# Patient Record
Sex: Male | Born: 1935
Health system: Southern US, Community
[De-identification: ages and names within clinical notes are randomized; demographics above are authoritative.]

## PROBLEM LIST (undated history)

## (undated) DIAGNOSIS — K5790 Diverticulosis of intestine, part unspecified, without perforation or abscess without bleeding: Secondary | ICD-10-CM

## (undated) DIAGNOSIS — E7800 Pure hypercholesterolemia, unspecified: Secondary | ICD-10-CM

## (undated) DIAGNOSIS — I10 Essential (primary) hypertension: Secondary | ICD-10-CM

## (undated) DIAGNOSIS — I719 Aortic aneurysm of unspecified site, without rupture: Secondary | ICD-10-CM

## (undated) DIAGNOSIS — M1990 Unspecified osteoarthritis, unspecified site: Secondary | ICD-10-CM

## (undated) DIAGNOSIS — I2510 Atherosclerotic heart disease of native coronary artery without angina pectoris: Secondary | ICD-10-CM

## (undated) DIAGNOSIS — D472 Monoclonal gammopathy: Secondary | ICD-10-CM

## (undated) DIAGNOSIS — Z8719 Personal history of other diseases of the digestive system: Secondary | ICD-10-CM

## (undated) DIAGNOSIS — D649 Anemia, unspecified: Secondary | ICD-10-CM

## (undated) DIAGNOSIS — K219 Gastro-esophageal reflux disease without esophagitis: Secondary | ICD-10-CM

## (undated) DIAGNOSIS — I509 Heart failure, unspecified: Secondary | ICD-10-CM

## (undated) DIAGNOSIS — N189 Chronic kidney disease, unspecified: Secondary | ICD-10-CM

## (undated) HISTORY — PX: HERNIA REPAIR: SHX51

## (undated) HISTORY — PX: CORONARY ANGIOPLASTY: SHX604

## (undated) HISTORY — PX: CARDIAC VALVE REPLACEMENT: SHX585

## (undated) HISTORY — PX: CORONARY ARTERY BYPASS GRAFT: SHX141

## (undated) HISTORY — PX: OTHER SURGICAL HISTORY: SHX169

## (undated) HISTORY — PX: COLON SURGERY: SHX602

## (undated) HISTORY — PX: JOINT REPLACEMENT: SHX530

## (undated) HISTORY — DX: Chronic kidney disease, unspecified: N18.9

## (undated) HISTORY — DX: Heart failure, unspecified: I50.9

## (undated) HISTORY — DX: Monoclonal gammopathy: D47.2

## (undated) HISTORY — PX: EYE SURGERY: SHX253

## (undated) HISTORY — PX: CHOLECYSTECTOMY: SHX55

## (undated) HISTORY — PX: AORTIC VALVE REPLACEMENT: SHX41

---

## 2004-08-03 ENCOUNTER — Ambulatory Visit: Payer: Self-pay | Admitting: Urology

## 2005-07-01 ENCOUNTER — Emergency Department: Payer: Self-pay | Admitting: Internal Medicine

## 2006-02-24 ENCOUNTER — Emergency Department: Payer: Self-pay | Admitting: Emergency Medicine

## 2008-12-24 ENCOUNTER — Inpatient Hospital Stay: Payer: Self-pay | Admitting: Surgery

## 2009-01-31 ENCOUNTER — Ambulatory Visit: Payer: Self-pay | Admitting: Gastroenterology

## 2009-08-15 ENCOUNTER — Ambulatory Visit: Payer: Self-pay | Admitting: Internal Medicine

## 2009-09-08 ENCOUNTER — Observation Stay: Payer: Self-pay | Admitting: Internal Medicine

## 2009-11-10 ENCOUNTER — Ambulatory Visit: Payer: Self-pay

## 2009-12-09 ENCOUNTER — Ambulatory Visit: Payer: Self-pay

## 2010-01-16 ENCOUNTER — Ambulatory Visit: Payer: Self-pay | Admitting: Internal Medicine

## 2010-02-14 ENCOUNTER — Ambulatory Visit: Payer: Self-pay | Admitting: Surgery

## 2010-02-16 ENCOUNTER — Encounter: Payer: Self-pay | Admitting: Surgery

## 2010-02-16 ENCOUNTER — Ambulatory Visit (HOSPITAL_COMMUNITY): Admission: RE | Admit: 2010-02-16 | Discharge: 2010-02-16 | Payer: Self-pay | Admitting: Surgery

## 2010-02-23 ENCOUNTER — Encounter: Payer: Self-pay | Admitting: Surgery

## 2010-02-23 ENCOUNTER — Inpatient Hospital Stay (HOSPITAL_COMMUNITY): Admission: RE | Admit: 2010-02-23 | Discharge: 2010-02-28 | Payer: Self-pay | Admitting: Surgery

## 2010-02-23 ENCOUNTER — Ambulatory Visit: Payer: Self-pay | Admitting: Surgery

## 2010-03-22 ENCOUNTER — Ambulatory Visit: Payer: Self-pay

## 2010-03-28 ENCOUNTER — Ambulatory Visit: Payer: Self-pay | Admitting: Surgery

## 2010-03-28 ENCOUNTER — Encounter: Admission: RE | Admit: 2010-03-28 | Discharge: 2010-03-28 | Payer: Self-pay | Admitting: Surgery

## 2010-04-04 ENCOUNTER — Encounter: Payer: Self-pay | Admitting: Internal Medicine

## 2010-04-17 ENCOUNTER — Encounter: Payer: Self-pay | Admitting: Internal Medicine

## 2010-05-10 ENCOUNTER — Ambulatory Visit: Payer: Self-pay | Admitting: General Practice

## 2010-05-18 ENCOUNTER — Encounter: Payer: Self-pay | Admitting: Internal Medicine

## 2010-05-24 ENCOUNTER — Inpatient Hospital Stay: Payer: Self-pay | Admitting: General Practice

## 2010-08-30 ENCOUNTER — Ambulatory Visit: Payer: Self-pay | Admitting: Internal Medicine

## 2010-10-20 ENCOUNTER — Ambulatory Visit: Payer: Self-pay

## 2010-11-23 ENCOUNTER — Ambulatory Visit: Payer: Self-pay | Admitting: General Practice

## 2010-12-04 LAB — POCT I-STAT 3, ART BLOOD GAS (G3+)
Acid-base deficit: 1 mmol/L (ref 0.0–2.0)
Acid-base deficit: 2 mmol/L (ref 0.0–2.0)
Bicarbonate: 22.1 mEq/L (ref 20.0–24.0)
Bicarbonate: 24.9 mEq/L — ABNORMAL HIGH (ref 20.0–24.0)
O2 Saturation: 100 %
Patient temperature: 33.5
TCO2: 23 mmol/L (ref 0–100)
TCO2: 26 mmol/L (ref 0–100)
pH, Arterial: 7.39 (ref 7.350–7.450)
pO2, Arterial: 124 mmHg — ABNORMAL HIGH (ref 80.0–100.0)
pO2, Arterial: 308 mmHg — ABNORMAL HIGH (ref 80.0–100.0)

## 2010-12-04 LAB — POCT I-STAT 4, (NA,K, GLUC, HGB,HCT)
Glucose, Bld: 127 mg/dL — ABNORMAL HIGH (ref 70–99)
Glucose, Bld: 87 mg/dL (ref 70–99)
HCT: 25 % — ABNORMAL LOW (ref 39.0–52.0)
HCT: 31 % — ABNORMAL LOW (ref 39.0–52.0)
Hemoglobin: 10.5 g/dL — ABNORMAL LOW (ref 13.0–17.0)
Hemoglobin: 10.5 g/dL — ABNORMAL LOW (ref 13.0–17.0)
Hemoglobin: 10.5 g/dL — ABNORMAL LOW (ref 13.0–17.0)
Hemoglobin: 12.6 g/dL — ABNORMAL LOW (ref 13.0–17.0)
Hemoglobin: 8.5 g/dL — ABNORMAL LOW (ref 13.0–17.0)
Potassium: 3.6 mEq/L (ref 3.5–5.1)
Potassium: 3.8 mEq/L (ref 3.5–5.1)
Potassium: 4.1 mEq/L (ref 3.5–5.1)
Potassium: 4.1 mEq/L (ref 3.5–5.1)
Potassium: 4.4 mEq/L (ref 3.5–5.1)
Sodium: 129 mEq/L — ABNORMAL LOW (ref 135–145)
Sodium: 132 mEq/L — ABNORMAL LOW (ref 135–145)

## 2010-12-04 LAB — BASIC METABOLIC PANEL
BUN: 14 mg/dL (ref 6–23)
BUN: 19 mg/dL (ref 6–23)
BUN: 21 mg/dL (ref 6–23)
BUN: 26 mg/dL — ABNORMAL HIGH (ref 6–23)
BUN: 30 mg/dL — ABNORMAL HIGH (ref 6–23)
CO2: 21 mEq/L (ref 19–32)
CO2: 27 mEq/L (ref 19–32)
CO2: 27 mEq/L (ref 19–32)
Calcium: 8 mg/dL — ABNORMAL LOW (ref 8.4–10.5)
Calcium: 8.3 mg/dL — ABNORMAL LOW (ref 8.4–10.5)
Chloride: 101 mEq/L (ref 96–112)
Chloride: 95 mEq/L — ABNORMAL LOW (ref 96–112)
Chloride: 96 mEq/L (ref 96–112)
Chloride: 99 mEq/L (ref 96–112)
Creatinine, Ser: 1.32 mg/dL (ref 0.4–1.5)
Creatinine, Ser: 1.8 mg/dL — ABNORMAL HIGH (ref 0.4–1.5)
GFR calc Af Amer: 44 mL/min — ABNORMAL LOW (ref >60)
GFR calc Af Amer: >60 mL/min (ref >60)
GFR calc non Af Amer: 37 mL/min — ABNORMAL LOW (ref >60)
GFR calc non Af Amer: 46 mL/min — ABNORMAL LOW (ref >60)
Glucose, Bld: 100 mg/dL — ABNORMAL HIGH (ref 70–99)
Glucose, Bld: 114 mg/dL — ABNORMAL HIGH (ref 70–99)
Glucose, Bld: 98 mg/dL (ref 70–99)
Potassium: 4.1 mEq/L (ref 3.5–5.1)
Potassium: 4.5 mEq/L (ref 3.5–5.1)
Potassium: 5.2 mEq/L — ABNORMAL HIGH (ref 3.5–5.1)
Sodium: 128 mEq/L — ABNORMAL LOW (ref 135–145)
Sodium: 128 mEq/L — ABNORMAL LOW (ref 135–145)
Sodium: 130 mEq/L — ABNORMAL LOW (ref 135–145)

## 2010-12-04 LAB — CBC
HCT: 20.2 % — ABNORMAL LOW (ref 39.0–52.0)
HCT: 20.5 % — ABNORMAL LOW (ref 39.0–52.0)
HCT: 21.1 % — ABNORMAL LOW (ref 39.0–52.0)
HCT: 22.9 % — ABNORMAL LOW (ref 39.0–52.0)
HCT: 40 % (ref 39.0–52.0)
Hemoglobin: 13.7 g/dL (ref 13.0–17.0)
Hemoglobin: 7 g/dL — ABNORMAL LOW (ref 13.0–17.0)
Hemoglobin: 7.3 g/dL — ABNORMAL LOW (ref 13.0–17.0)
Hemoglobin: 7.8 g/dL — ABNORMAL LOW (ref 13.0–17.0)
Hemoglobin: 8.5 g/dL — ABNORMAL LOW (ref 13.0–17.0)
Hemoglobin: 8.8 g/dL — ABNORMAL LOW (ref 13.0–17.0)
MCHC: 34.1 g/dL (ref 30.0–36.0)
MCHC: 34.3 g/dL (ref 30.0–36.0)
MCHC: 34.3 g/dL (ref 30.0–36.0)
MCV: 94.5 fL (ref 78.0–100.0)
MCV: 94.8 fL (ref 78.0–100.0)
MCV: 97.3 fL (ref 78.0–100.0)
Platelets: 136 10*3/uL — ABNORMAL LOW (ref 150–400)
Platelets: 149 10*3/uL — ABNORMAL LOW (ref 150–400)
Platelets: 171 10*3/uL (ref 150–400)
Platelets: 91 10*3/uL — ABNORMAL LOW (ref 150–400)
RBC: 2.13 MIL/uL — ABNORMAL LOW (ref 4.22–5.81)
RBC: 2.24 MIL/uL — ABNORMAL LOW (ref 4.22–5.81)
RBC: 2.65 MIL/uL — ABNORMAL LOW (ref 4.22–5.81)
RDW: 13.6 % (ref 11.5–15.5)
RDW: 16.5 % — ABNORMAL HIGH (ref 11.5–15.5)
WBC: 15.5 10*3/uL — ABNORMAL HIGH (ref 4.0–10.5)
WBC: 9.3 10*3/uL (ref 4.0–10.5)
WBC: 9.9 10*3/uL (ref 4.0–10.5)

## 2010-12-04 LAB — PROTIME-INR
INR: 1.71 — ABNORMAL HIGH (ref 0.00–1.49)
Prothrombin Time: 13.4 seconds (ref 11.6–15.2)
Prothrombin Time: 19.9 seconds — ABNORMAL HIGH (ref 11.6–15.2)

## 2010-12-04 LAB — PREPARE PLATELETS

## 2010-12-04 LAB — DIFFERENTIAL
Eosinophils Absolute: 0.2 10*3/uL (ref 0.0–0.7)
Eosinophils Relative: 2 % (ref 0–5)
Lymphocytes Relative: 10 % — ABNORMAL LOW (ref 12–46)
Lymphs Abs: 0.9 10*3/uL (ref 0.7–4.0)
Monocytes Absolute: 1.1 10*3/uL — ABNORMAL HIGH (ref 0.1–1.0)
Monocytes Relative: 12 % (ref 3–12)

## 2010-12-04 LAB — COMPREHENSIVE METABOLIC PANEL
Alkaline Phosphatase: 73 U/L (ref 39–117)
Calcium: 9.2 mg/dL (ref 8.4–10.5)
Chloride: 94 mEq/L — ABNORMAL LOW (ref 96–112)
Creatinine, Ser: 1.13 mg/dL (ref 0.4–1.5)
Glucose, Bld: 104 mg/dL — ABNORMAL HIGH (ref 70–99)
Total Bilirubin: 1.1 mg/dL (ref 0.3–1.2)

## 2010-12-04 LAB — URINALYSIS, ROUTINE W REFLEX MICROSCOPIC
Hgb urine dipstick: NEGATIVE
Protein, ur: NEGATIVE mg/dL
Specific Gravity, Urine: 1.011 (ref 1.005–1.030)

## 2010-12-04 LAB — HEMOGLOBIN A1C: Mean Plasma Glucose: 108 mg/dL (ref <117)

## 2010-12-04 LAB — ABO/RH: ABO/RH(D): A POS

## 2010-12-04 LAB — BLOOD GAS, ARTERIAL
Acid-base deficit: 0.3 mmol/L (ref 0.0–2.0)
Bicarbonate: 23.5 mEq/L (ref 20.0–24.0)
FIO2: 0.21 %
Patient temperature: 98.6
pH, Arterial: 7.427 (ref 7.350–7.450)

## 2010-12-04 LAB — PLATELET COUNT: Platelets: 112 10*3/uL — ABNORMAL LOW (ref 150–400)

## 2010-12-04 LAB — MAGNESIUM: Magnesium: 2.6 mg/dL — ABNORMAL HIGH (ref 1.5–2.5)

## 2010-12-04 LAB — CREATININE, SERUM
Creatinine, Ser: 0.92 mg/dL (ref 0.4–1.5)
GFR calc non Af Amer: >60 mL/min (ref >60)

## 2010-12-04 LAB — GLUCOSE, CAPILLARY
Glucose-Capillary: 101 mg/dL — ABNORMAL HIGH (ref 70–99)
Glucose-Capillary: 117 mg/dL — ABNORMAL HIGH (ref 70–99)
Glucose-Capillary: 129 mg/dL — ABNORMAL HIGH (ref 70–99)
Glucose-Capillary: 130 mg/dL — ABNORMAL HIGH (ref 70–99)
Glucose-Capillary: 137 mg/dL — ABNORMAL HIGH (ref 70–99)
Glucose-Capillary: 93 mg/dL (ref 70–99)

## 2010-12-04 LAB — POCT I-STAT, CHEM 8
BUN: 14 mg/dL (ref 6–23)
HCT: 23 % — ABNORMAL LOW (ref 39.0–52.0)
Hemoglobin: 7.8 g/dL — ABNORMAL LOW (ref 13.0–17.0)
Sodium: 133 mEq/L — ABNORMAL LOW (ref 135–145)
TCO2: 23 mmol/L (ref 0–100)

## 2010-12-04 LAB — CROSSMATCH

## 2010-12-04 LAB — POCT I-STAT GLUCOSE
Glucose, Bld: 94 mg/dL (ref 70–99)
Operator id: 125961

## 2010-12-04 LAB — POCT I-STAT 3, VENOUS BLOOD GAS (G3P V)
pCO2, Ven: 45.6 mmHg (ref 45.0–50.0)
pH, Ven: 7.345 — ABNORMAL HIGH (ref 7.250–7.300)

## 2010-12-04 LAB — TYPE AND SCREEN: Antibody Screen: NEGATIVE

## 2010-12-04 LAB — SURGICAL PCR SCREEN: Staphylococcus aureus: NEGATIVE

## 2010-12-04 LAB — APTT: aPTT: 50 seconds — ABNORMAL HIGH (ref 24–37)

## 2010-12-04 LAB — PREPARE RBC (CROSSMATCH)

## 2010-12-06 ENCOUNTER — Ambulatory Visit: Payer: Self-pay | Admitting: General Practice

## 2011-01-30 NOTE — Assessment & Plan Note (Signed)
OFFICE VISIT   ARJENIS, INDOVINA  DOB:  [birth year]                                        March 28, 2010  CHART #:  [id number]   HISTORY:  The patient returned to my office today for followup status  post coronary artery bypass graft surgery x5 and aortic valve  replacement with a 27-mm Edwards pericardial valve on February 23, 2010.  He  did have some perioperative atrial fibrillation and was sent home on  amiodarone.  He said that he did have 2 episodes where he felt dizzy and  began sweating and passed out.  One of these was witnessed by his  daughter.  She said she called 911, but he recovered before EMS got  there.  They since talked with Dr. Alveria Apley office and his amiodarone  has been discontinued.  He said since then he has had no further  episodes of dizziness, sweating, or syncope.  He has also been having a  lot of problems with his left knee, which has been chronic due to severe  degenerative disease.  He has been having effusions in the knee and has  had difficulty walking and is not using a walker.  He needs to have left  knee replacement, actually recovers from his heart surgery.   PHYSICAL EXAMINATION:  Today, his blood pressure is 160/80, pulse is 60  and regular, and respiratory rate is 18, unlabored.  Oxygen saturation  on room air is 97%.  He looks well.  Cardiac exam shows a regular rate  and rhythm with normal valve sounds.  There is no murmur.  His lungs are  clear.  The chest incision is healing well and the sternum is stable.  His right leg incision is healing well.  There is no ankle edema.  His  left knee is swollen and he is wearing knee brace.   DIAGNOSTIC TESTS:  Followup chest x-ray today shows clear lung fields  and no pleural effusions.   MEDICATIONS:  1. Aspirin 81 mg daily.  2. Colace b.i.d.  3. Iron b.i.d.  4. Lisinopril 10 mg daily.  5. Lopressor 12.5 mg b.i.d.  6. Amlodipine 5 mg daily.  7. Hydrocodone 5/325 p.r.n.  for pain.  8. Ambien 10 mg nightly p.r.n. for sleep.   IMPRESSION:  Overall, the patient is making a good recovery following  his surgery.  I told him he could return to driving a car when he feels  comfortable with that, but should refrain from lifting anything heavier  than 10 pounds for a total of 3 months from date of surgery.  I think he  will be able to have a left knee replacement after about 3 months.  He  will continue to follow up with Dr. Serafina Royals and will contact me  if he develops any problems with his incisions.   Gilford Raid, M.D.  Electronically Signed   BB/MEDQ  D:  03/28/2010  T:  03/29/2010  Job:  [id number]   cc:   Corey Skains, MD

## 2011-01-30 NOTE — Consult Note (Signed)
NEW PATIENT CONSULTATION   [name], James T.  DOB:  1931/03/25                                        Feb 14, 2010  CHART #:  [id number]   REASON FOR CONSULTATION:  Severe three-vessel coronary artery disease  with markedly abnormal stress test.   CLINICAL HISTORY:  I was asked by Dr. Nehemiah Massed to evaluate the patient  for coronary artery bypass graft surgery.  He is a 77 year old gentleman  with a history of hypertension and hyperlipidemia, as well as peripheral  vascular disease with a known abdominal aortic aneurysm who was  undergoing preoperative cardiac evaluation for planned left knee  replacement.  He underwent a nuclear exercise treadmill test, which  showed a normal left ventricular systolic function with ejection  fraction of 51% as well as moderate reversible inferior and mildly  reversible posterolateral myocardial perfusion defect consistent with  ischemia.  He subsequently underwent cardiac catheterization on January 14, 2010.  This showed about 20% proximal left main narrowing.  There  was 75% ostial LAD stenosis.  There was 90% proximal LAD and 60% mid LAD  stenoses.  Left circumflex had a single large marginal branch that had  90% stenosis.  The right coronary artery was occluded proximally with  collaterals to the distal vessel from the left.  His left ventriculogram  showed mild anterior hypokinesis.  There is moderate aortic  atherosclerosis.  The patient said he was subsequently referred to Dr.  Marnee Guarneri at [facility] and underwent evaluation there despite his insurance  being out of network at [place].  He did undergo a 2-D echocardiogram  there.  I had the report of that and reportedly there was moderate  aortic insufficiency, mild mitral regurgitation, and trivial tricuspid  regurgitation.  There was no aortic stenosis.  I have not reviewed the  echocardiogram myself, but the patient said that he was told by Dr.  Tamala Julian that he had no valvular  problems.  His ejection fraction by  echocardiogram was estimated at 50%.  When the patient found out that he  was going to have to pay a significant portion of his bill, he requested  referral to our group at [facility].   REVIEW OF SYSTEMS:  As follows.  GENERAL:  He denies any fever or chills.  He has had no recent change in  his weight.  He denies fatigue and is very physically active.  EYES:  Negative.  ENT:  Negative endocrine.  He denies diabetes and  hypothyroidism.  CARDIOVASCULAR:  He denies any chest pain or pressure.  He has had no  pain his neck or jaw.  He has had some mild tingling in his arms and  shortness of breath on occasion.  He denies orthopnea or PND.  He denies  palpitations.  He has had chronic swelling in his left leg related to  his knee as well as some more recent swelling of his right ankle.  RESPIRATORY:  He denies cough and sputum production.  GASTROINTESTINAL:  He has had no nausea or vomiting.  He denies melena  or bright red blood per rectum.  GENITOURINARY:  He denies dysuria and hematuria.  VASCULAR:  He denies claudication and phlebitis.  NEUROLOGICAL:  He denies any focal weakness or numbness.  He denies  dizziness and syncope.  He has never had  TIA or stroke.  MUSCULOSKELETAL:  He has had chronic problems with his left knee and has  undergone 3 previous knee replacement surgeries and is scheduled to have  a fourth at this time.  PSYCHIATRIC:  Negative.  HEMATOLOGICAL:  Negative.   ALLERGIES:  To morphine, which he said caused anaphylaxis with cardiac  arrest.  On further questioning it sounds like he had a precipitous drop  in his blood pressure.  He is also allergic IVP dye.   MEDICATIONS:  1. Norvasc 5 mg daily.  2. Spironolactone 12.5 mg daily.  3. Aspirin 81 mg daily.  4. Ambien 10 mg at bedtime.  5. Colace b.i.d.  6. Prinivil 20 mg b.i.d.   PAST MEDICAL HISTORY:  Significant for hypertension and hyperlipidemia.  He has a history of  known abdominal aortic aneurysm.  He said, it was  about 3 cm and has been followed in [place].  He has history of  hiatal hernia.  Previous surgery includes cholecystectomy and hernia  repair in the past.  He underwent laparotomy for colon resection due to  perforation from diverticulitis in the past and had a colostomy after  that.  He subsequently underwent a colostomy takedown.   FAMILY HISTORY:  His father died of myocardial infarction.   SOCIAL HISTORY:  He is widowed.  His wife had previous bypass surgery  and subsequently developed, congestive heart failure, secondary to  valvular disease.  He is retired.  He has 2 adult children.  He quit  smoking at age 89.  He denies alcohol use.  He is very physically active  and was doing about 700 push-ups per day until he was told, not to do  them anymore after his catheterization.   PHYSICAL EXAMINATION:  VITAL SIGNS:  Blood pressure 179/81, pulse 60 and  regular, and respiratory rate is 18 unlabored.  Oxygen saturation on  room air is 97%.  GENERAL:  He is a well-developed, white male in no distress.  HEENT:  Exam shows to be normocephalic and atraumatic.  Pupils are equal  and reactive to light and accommodation.  Extra extraocular muscles are  intact.  Throat is clear.  NECK:  Exam shows normal carotid pulses bilaterally.  There is no bruit.  There is no adenopathy or thyromegaly.  CARDIAC:  Shows regular rate and rhythm with a grade [id number] systolic  murmur along the right sternal border.  There is a soft S2 and may be a  1/6 diastolic murmur.  LUNGS:  Clear.  ABDOMEN:  Shows active bowel sounds.  His abdomen is soft and nontender.  There no palpable masses or organomegaly.  There is a well-healed  laparotomy scar.  EXTREMITIES:  Shows scar over his left knee from previous knee  replacement.  There is marked deformity and swelling over his left knee.  There is edema of his left lower leg.  There is mild edema in the right   ankle.  Pedal pulses bilaterally.  SKIN:  Warm and dry.  NEUROLOGIC:  Shows to be alert and oriented x3.  Motor and sensory exams  is grossly normal.   IMPRESSION:  The patient has severe three-vessel coronary artery disease  with markedly abnormal stress test and 2-D echocardiogram at [facility]  reportedly showing moderate aortic insufficiency.  I agree that he needs  coronary artery bypass graft surgery to prevent further ischemia and  infarction and allow his future knee surgery to be done more safely.  He  may require aortic valve replacement  if he truly has moderate aortic  insufficiency.  I will review his 2-D echocardiogram from [place], which is  supposed to be sent to [name] today.  We will also do an intraoperative  transesophageal echocardiogram to evaluate this further.  I think if he  really has moderate aortic insufficiency, this valve should be replaced  at the time of surgery to prevent worsening of his insufficiency and  progressive left ventricular dysfunction.  I discussed all this with he  and his daughter.  We discussed alternatives, benefits, and risks  including, but not limited to bleeding, blood transfusion, infection,  stroke, myocardial infarction, graft failure, heart block requiring  permanent pacemaker, and death.  Also discussed possible valve  replacement depending on the results of his echocardiogram.  I told them  that I would use a tissue valve, if I need to replace his valve and they  are in full agreement with replacement, if I feel that it is  needed and using a tissue valve, given his age.  We will plan to do his  surgery on Monday, February 20, 2010.   Gilford Raid, M.D.  Electronically Signed   BB/MEDQ  D:  02/14/2010  T:  02/15/2010  Job:  [id number]   cc:   Serafina Royals, MD

## 2011-05-16 ENCOUNTER — Ambulatory Visit: Payer: Self-pay | Admitting: Gastroenterology

## 2011-07-12 ENCOUNTER — Ambulatory Visit: Payer: Self-pay | Admitting: Gastroenterology

## 2012-07-29 ENCOUNTER — Ambulatory Visit: Payer: Self-pay | Admitting: Internal Medicine

## 2013-02-20 ENCOUNTER — Ambulatory Visit: Payer: Self-pay | Admitting: Nephrology

## 2013-02-26 ENCOUNTER — Ambulatory Visit: Payer: Self-pay | Admitting: Oncology

## 2013-02-27 ENCOUNTER — Ambulatory Visit: Payer: Self-pay | Admitting: Oncology

## 2013-03-17 ENCOUNTER — Ambulatory Visit: Payer: Self-pay | Admitting: Oncology

## 2013-03-17 DIAGNOSIS — N185 Chronic kidney disease, stage 5: Secondary | ICD-10-CM | POA: Insufficient documentation

## 2013-03-17 DIAGNOSIS — D419 Neoplasm of uncertain behavior of unspecified urinary organ: Secondary | ICD-10-CM | POA: Insufficient documentation

## 2013-03-17 DIAGNOSIS — N184 Chronic kidney disease, stage 4 (severe): Secondary | ICD-10-CM | POA: Insufficient documentation

## 2013-03-17 DIAGNOSIS — N401 Enlarged prostate with lower urinary tract symptoms: Secondary | ICD-10-CM | POA: Insufficient documentation

## 2013-03-26 ENCOUNTER — Ambulatory Visit: Payer: Self-pay | Admitting: Urology

## 2013-04-09 DIAGNOSIS — N2889 Other specified disorders of kidney and ureter: Secondary | ICD-10-CM | POA: Insufficient documentation

## 2013-05-26 ENCOUNTER — Ambulatory Visit: Payer: Self-pay | Admitting: Oncology

## 2013-05-26 LAB — CBC CANCER CENTER
Basophil #: 0.1 x10 3/mm (ref 0.0–0.1)
Basophil %: 0.9 %
Eosinophil %: 8.2 %
HCT: 38.4 % — ABNORMAL LOW (ref 40.0–52.0)
HGB: 12.9 g/dL — ABNORMAL LOW (ref 13.0–18.0)
MCHC: 33.6 g/dL (ref 32.0–36.0)
Monocyte %: 12.7 %
WBC: 5.8 x10 3/mm (ref 3.8–10.6)

## 2013-05-26 LAB — BASIC METABOLIC PANEL
Calcium, Total: 8.8 mg/dL (ref 8.5–10.1)
Chloride: 95 mmol/L — ABNORMAL LOW (ref 98–107)
Creatinine: 1.59 mg/dL — ABNORMAL HIGH (ref 0.60–1.30)
EGFR (African American): 47 — ABNORMAL LOW
EGFR (Non-African Amer.): 40 — ABNORMAL LOW
Osmolality: 262 (ref 275–301)
Potassium: 4.6 mmol/L (ref 3.5–5.1)

## 2013-05-27 LAB — URINE IEP, RANDOM

## 2013-05-28 LAB — PROT IMMUNOELECTROPHORES(ARMC)

## 2013-06-17 ENCOUNTER — Ambulatory Visit: Payer: Self-pay | Admitting: Oncology

## 2013-12-01 ENCOUNTER — Ambulatory Visit: Payer: Self-pay | Admitting: Oncology

## 2013-12-02 LAB — BASIC METABOLIC PANEL
ANION GAP: 10 (ref 7–16)
BUN: 24 mg/dL — AB (ref 7–18)
CO2: 24 mmol/L (ref 21–32)
Calcium, Total: 9.1 mg/dL (ref 8.5–10.1)
Chloride: 96 mmol/L — ABNORMAL LOW (ref 98–107)
Creatinine: 1.53 mg/dL — ABNORMAL HIGH (ref 0.60–1.30)
EGFR (African American): 49 — ABNORMAL LOW
GFR CALC NON AF AMER: 42 — AB
Glucose: 90 mg/dL (ref 65–99)
OSMOLALITY: 264 (ref 275–301)
Potassium: 4.1 mmol/L (ref 3.5–5.1)
SODIUM: 130 mmol/L — AB (ref 136–145)

## 2013-12-02 LAB — CBC CANCER CENTER
BASOS ABS: 0.1 x10 3/mm (ref 0.0–0.1)
Basophil %: 0.8 %
Eosinophil #: 0.6 x10 3/mm (ref 0.0–0.7)
Eosinophil %: 9.8 %
HCT: 38.8 % — AB (ref 40.0–52.0)
HGB: 12.8 g/dL — AB (ref 13.0–18.0)
LYMPHS ABS: 0.9 x10 3/mm — AB (ref 1.0–3.6)
Lymphocyte %: 15.3 %
MCH: 31.9 pg (ref 26.0–34.0)
MCHC: 33 g/dL (ref 32.0–36.0)
MCV: 97 fL (ref 80–100)
MONOS PCT: 12.2 %
Monocyte #: 0.7 x10 3/mm (ref 0.2–1.0)
NEUTROS ABS: 3.8 x10 3/mm (ref 1.4–6.5)
Neutrophil %: 61.9 %
PLATELETS: 230 x10 3/mm (ref 150–440)
RBC: 4.02 10*6/uL — AB (ref 4.40–5.90)
RDW: 13.6 % (ref 11.5–14.5)
WBC: 6.1 x10 3/mm (ref 3.8–10.6)

## 2013-12-03 LAB — PROT IMMUNOELECTROPHORES(ARMC)

## 2013-12-03 LAB — URINE IEP, RANDOM

## 2013-12-03 LAB — KAPPA/LAMBDA FREE LIGHT CHAINS (ARMC)

## 2013-12-04 LAB — FREE K+L LT CHAIN, QT, UR (24 HOUR)

## 2013-12-16 ENCOUNTER — Ambulatory Visit: Payer: Self-pay | Admitting: Oncology

## 2014-05-17 DIAGNOSIS — M1710 Unilateral primary osteoarthritis, unspecified knee: Secondary | ICD-10-CM | POA: Insufficient documentation

## 2014-05-17 DIAGNOSIS — M179 Osteoarthritis of knee, unspecified: Secondary | ICD-10-CM | POA: Insufficient documentation

## 2014-06-03 ENCOUNTER — Ambulatory Visit: Payer: Self-pay | Admitting: Oncology

## 2014-06-03 LAB — CBC CANCER CENTER
Basophil #: 0 x10 3/mm (ref 0.0–0.1)
Basophil %: 0.6 %
EOS ABS: 0.5 x10 3/mm (ref 0.0–0.7)
EOS PCT: 8 %
HCT: 37.9 % — ABNORMAL LOW (ref 40.0–52.0)
HGB: 12.5 g/dL — ABNORMAL LOW (ref 13.0–18.0)
LYMPHS PCT: 20.7 %
Lymphocyte #: 1.3 x10 3/mm (ref 1.0–3.6)
MCH: 31.7 pg (ref 26.0–34.0)
MCHC: 32.9 g/dL (ref 32.0–36.0)
MCV: 96 fL (ref 80–100)
Monocyte #: 0.9 x10 3/mm (ref 0.2–1.0)
Monocyte %: 13.9 %
Neutrophil #: 3.5 x10 3/mm (ref 1.4–6.5)
Neutrophil %: 56.8 %
PLATELETS: 195 x10 3/mm (ref 150–440)
RBC: 3.94 10*6/uL — AB (ref 4.40–5.90)
RDW: 13.6 % (ref 11.5–14.5)
WBC: 6.3 x10 3/mm (ref 3.8–10.6)

## 2014-06-03 LAB — BASIC METABOLIC PANEL
ANION GAP: 7 (ref 7–16)
BUN: 28 mg/dL — ABNORMAL HIGH (ref 7–18)
CHLORIDE: 98 mmol/L (ref 98–107)
CREATININE: 1.39 mg/dL — AB (ref 0.60–1.30)
Calcium, Total: 8.8 mg/dL (ref 8.5–10.1)
Co2: 27 mmol/L (ref 21–32)
EGFR (African American): 55 — ABNORMAL LOW
EGFR (Non-African Amer.): 47 — ABNORMAL LOW
Glucose: 92 mg/dL (ref 65–99)
Osmolality: 270 (ref 275–301)
POTASSIUM: 4.3 mmol/L (ref 3.5–5.1)
Sodium: 132 mmol/L — ABNORMAL LOW (ref 136–145)

## 2014-06-05 LAB — FREE K+L LT CHAIN, QT, UR (24 HOUR)

## 2014-06-07 LAB — URINE IEP, RANDOM

## 2014-06-09 LAB — PROT IMMUNOELECTROPHORES(ARMC)

## 2014-06-09 LAB — KAPPA/LAMBDA FREE LIGHT CHAINS (ARMC)

## 2014-06-17 ENCOUNTER — Ambulatory Visit: Payer: Self-pay | Admitting: Oncology

## 2014-06-28 DIAGNOSIS — I255 Ischemic cardiomyopathy: Secondary | ICD-10-CM | POA: Insufficient documentation

## 2014-07-09 DIAGNOSIS — I714 Abdominal aortic aneurysm, without rupture, unspecified: Secondary | ICD-10-CM | POA: Insufficient documentation

## 2014-07-09 DIAGNOSIS — I350 Nonrheumatic aortic (valve) stenosis: Secondary | ICD-10-CM | POA: Insufficient documentation

## 2014-07-09 DIAGNOSIS — I340 Nonrheumatic mitral (valve) insufficiency: Secondary | ICD-10-CM | POA: Insufficient documentation

## 2014-12-09 ENCOUNTER — Ambulatory Visit
Admit: 2014-12-09 | Disposition: A | Discharge status: Home / Self Care | Payer: Self-pay | Attending: Oncology | Admitting: Oncology

## 2014-12-09 LAB — BASIC METABOLIC PANEL
ANION GAP: 5 — AB (ref 7–16)
BUN: 28 mg/dL — ABNORMAL HIGH
CALCIUM: 8.5 mg/dL — AB
CO2: 26 mmol/L
Chloride: 95 mmol/L — ABNORMAL LOW
Creatinine: 1.64 mg/dL — ABNORMAL HIGH
GFR CALC AF AMER: 45 — AB
GFR CALC NON AF AMER: 39 — AB
Glucose: 95 mg/dL
POTASSIUM: 4.7 mmol/L
SODIUM: 126 mmol/L — AB

## 2014-12-09 LAB — CBC CANCER CENTER
BASOS ABS: 0 x10 3/mm (ref 0.0–0.1)
BASOS PCT: 0.9 %
EOS PCT: 7 %
Eosinophil #: 0.4 x10 3/mm (ref 0.0–0.7)
HCT: 36.7 % — ABNORMAL LOW (ref 40.0–52.0)
HGB: 12.3 g/dL — AB (ref 13.0–18.0)
LYMPHS PCT: 14.9 %
Lymphocyte #: 0.8 x10 3/mm — ABNORMAL LOW (ref 1.0–3.6)
MCH: 31.8 pg (ref 26.0–34.0)
MCHC: 33.6 g/dL (ref 32.0–36.0)
MCV: 95 fL (ref 80–100)
Monocyte #: 0.8 x10 3/mm (ref 0.2–1.0)
Monocyte %: 14.7 %
NEUTROS PCT: 62.5 %
Neutrophil #: 3.5 x10 3/mm (ref 1.4–6.5)
Platelet: 189 x10 3/mm (ref 150–440)
RBC: 3.87 10*6/uL — ABNORMAL LOW (ref 4.40–5.90)
RDW: 13.9 % (ref 11.5–14.5)
WBC: 5.6 x10 3/mm (ref 3.8–10.6)

## 2014-12-10 LAB — KAPPA/LAMBDA FREE LIGHT CHAINS (ARMC)

## 2014-12-17 ENCOUNTER — Ambulatory Visit
Admit: 2014-12-17 | Disposition: A | Discharge status: Home / Self Care | Payer: Self-pay | Attending: Oncology | Admitting: Oncology

## 2014-12-17 LAB — FREE K+L LT CHAIN, QT, UR (24 HOUR)

## 2014-12-17 LAB — URINE IEP, RANDOM

## 2015-01-03 DIAGNOSIS — I10 Essential (primary) hypertension: Secondary | ICD-10-CM | POA: Insufficient documentation

## 2015-01-11 ENCOUNTER — Ambulatory Visit
Admit: 2015-01-11 | Disposition: A | Discharge status: Home / Self Care | Payer: Self-pay | Attending: Gastroenterology | Admitting: Gastroenterology

## 2015-01-25 DIAGNOSIS — R6881 Early satiety: Secondary | ICD-10-CM | POA: Insufficient documentation

## 2015-02-07 ENCOUNTER — Ambulatory Visit
Admission: RE | Admit: 2015-02-07 | Discharge: 2015-02-07 | Disposition: A | Discharge status: Home / Self Care | Payer: PPO | Source: Ambulatory Visit | Attending: Gastroenterology | Admitting: Gastroenterology

## 2015-02-07 ENCOUNTER — Encounter: Payer: Self-pay | Admitting: Anesthesiology

## 2015-02-07 ENCOUNTER — Encounter
Admission: RE | Disposition: A | Discharge status: Home / Self Care | Payer: Self-pay | Source: Ambulatory Visit | Attending: Gastroenterology

## 2015-02-07 ENCOUNTER — Ambulatory Visit: Payer: PPO | Admitting: Anesthesiology

## 2015-02-07 DIAGNOSIS — E780 Pure hypercholesterolemia: Secondary | ICD-10-CM | POA: Insufficient documentation

## 2015-02-07 DIAGNOSIS — Z79899 Other long term (current) drug therapy: Secondary | ICD-10-CM | POA: Insufficient documentation

## 2015-02-07 DIAGNOSIS — I10 Essential (primary) hypertension: Secondary | ICD-10-CM | POA: Insufficient documentation

## 2015-02-07 DIAGNOSIS — Z87891 Personal history of nicotine dependence: Secondary | ICD-10-CM | POA: Insufficient documentation

## 2015-02-07 DIAGNOSIS — Z954 Presence of other heart-valve replacement: Secondary | ICD-10-CM | POA: Diagnosis not present

## 2015-02-07 DIAGNOSIS — Z955 Presence of coronary angioplasty implant and graft: Secondary | ICD-10-CM | POA: Insufficient documentation

## 2015-02-07 DIAGNOSIS — R6881 Early satiety: Secondary | ICD-10-CM | POA: Insufficient documentation

## 2015-02-07 DIAGNOSIS — Z951 Presence of aortocoronary bypass graft: Secondary | ICD-10-CM | POA: Diagnosis not present

## 2015-02-07 DIAGNOSIS — Z7982 Long term (current) use of aspirin: Secondary | ICD-10-CM | POA: Diagnosis not present

## 2015-02-07 DIAGNOSIS — R140 Abdominal distension (gaseous): Secondary | ICD-10-CM | POA: Diagnosis present

## 2015-02-07 DIAGNOSIS — K2970 Gastritis, unspecified, without bleeding: Secondary | ICD-10-CM | POA: Insufficient documentation

## 2015-02-07 DIAGNOSIS — Z96652 Presence of left artificial knee joint: Secondary | ICD-10-CM | POA: Insufficient documentation

## 2015-02-07 DIAGNOSIS — Z9049 Acquired absence of other specified parts of digestive tract: Secondary | ICD-10-CM | POA: Insufficient documentation

## 2015-02-07 DIAGNOSIS — K319 Disease of stomach and duodenum, unspecified: Secondary | ICD-10-CM | POA: Diagnosis not present

## 2015-02-07 HISTORY — DX: Pure hypercholesterolemia, unspecified: E78.00

## 2015-02-07 HISTORY — DX: Essential (primary) hypertension: I10

## 2015-02-07 HISTORY — PX: ESOPHAGOGASTRODUODENOSCOPY (EGD) WITH PROPOFOL: SHX5813

## 2015-02-07 SURGERY — ESOPHAGOGASTRODUODENOSCOPY (EGD) WITH PROPOFOL
Anesthesia: General

## 2015-02-07 MED ORDER — FENTANYL CITRATE (PF) 100 MCG/2ML IJ SOLN
INTRAMUSCULAR | Status: DC | PRN
  Administered 2015-02-07: 50 ug via INTRAVENOUS

## 2015-02-07 MED ORDER — LACTATED RINGERS IV SOLN
INTRAVENOUS | Status: DC | PRN
Start: 2015-02-07 — End: 2015-02-07
  Administered 2015-02-07: 10:00:00 via INTRAVENOUS

## 2015-02-07 MED ORDER — PROPOFOL INFUSION 10 MG/ML OPTIME
INTRAVENOUS | Status: DC | PRN
  Administered 2015-02-07: 100 ug/kg/min via INTRAVENOUS

## 2015-02-07 MED ORDER — SODIUM CHLORIDE 0.9 % IV SOLN
INTRAVENOUS | Status: DC
  Administered 2015-02-07: 1000 mL via INTRAVENOUS

## 2015-02-07 NOTE — Op Note (Signed)
[facility] Patient Name: [name] Procedure Date: 02/07/2015 9:41 AM MRN: [id number] Account #: [id number] Date of Birth: 1930/11/18 Admit Type: Outpatient Age: 81 Room: [facility] ENDO ROOM 4 Gender: Male Note Status: Finalized Procedure:         Upper GI endoscopy Indications:       Early satiety, abdominal bloating Providers:         Lupita Dawn. Candace Cruise, MD Referring MD:      Youlanda Roys. Ola Spurr, MD (Referring MD) Medicines:         Monitored Anesthesia Care Complications:     No immediate complications. Procedure:         Pre-Anesthesia Assessment:                    - Prior to the procedure, a History and Physical was                     performed, and patient medications, allergies and                     sensitivities were reviewed. The patient's tolerance of                     previous anesthesia was reviewed.                    - The risks and benefits of the procedure and the sedation                     options and risks were discussed with the patient. All                     questions were answered and informed consent was obtained.                    - After reviewing the risks and benefits, the patient was                     deemed in satisfactory condition to undergo the procedure.                    After obtaining informed consent, the endoscope was passed                     under direct vision. Throughout the procedure, the                     patient's blood pressure, pulse, and oxygen saturations                     were monitored continuously. The Endoscope was introduced                     through the mouth, and advanced to the second part of                     duodenum. The upper GI endoscopy was accomplished without                     difficulty. The patient tolerated the procedure well. Findings:      The examined esophagus was normal.      Localized mild inflammation was found in the gastric antrum. Biopsies       were  taken with a cold forceps  for Helicobacter pylori testing.      The exam was otherwise without abnormality.      The examined duodenum was normal. Impression:        - Normal esophagus.                    - Gastritis. Biopsied.                    - The examination was otherwise normal.                    - Normal examined duodenum. Recommendation:    - Discharge patient to home.                    - Await pathology results.                    - Observe patient's clinical course.                    - Continue present medications.                    - The findings and recommendations were discussed with the                     patient. Procedure Code(s): --- Professional ---                    [phone], Esophagogastroduodenoscopy, flexible, transoral;                     with biopsy, single or multiple Diagnosis Code(s): --- Professional ---                    K29.70, Gastritis, unspecified, without bleeding                    R68.81, Early satiety CPT copyright 2014 American Medical Association. All rights reserved. The codes documented in this report are preliminary and upon coder review may  be revised to meet current compliance requirements. Hulen Luster, MD 02/07/2015 10:10:45 AM This report has been signed electronically. Number of Addenda: 0 Note Initiated On: 02/07/2015 9:41 AM      [facility]

## 2015-02-07 NOTE — H&P (Signed)
    Primary Care Physician:  Adrian Prows, MD Primary Gastroenterologist:  Dr. Candace Cruise  Pre-Procedure History & Physical: HPI:  [name] is a 81 y.o. male is here for an EGD.   Past Medical History  Diagnosis Date  . Hypertension   . Hypercholesteremia     Past Surgical History  Procedure Laterality Date  . Cardiac valve replacement      pig valve  . Coronary angioplasty    . Bipass    . Cholecystectomy    . Joint replacement      left knee x4    Prior to Admission medications   Medication Sig Start Date End Date Taking? Authorizing Provider  amLODipine (NORVASC) 5 MG tablet Take 5 mg by mouth daily.   Yes Historical Provider, MD  aspirin 81 MG tablet Take 81 mg by mouth daily.   Yes Historical Provider, MD  enalapril (VASOTEC) 10 MG tablet Take 10 mg by mouth 2 (two) times daily.   Yes Historical Provider, MD  Melatonin 3 MG TABS Take 1.5 mg by mouth at bedtime as needed (sleep).   Yes Historical Provider, MD  pravastatin (PRAVACHOL) 20 MG tablet Take 20 mg by mouth every evening.   Yes Historical Provider, MD    Allergies as of 01/25/2015  . (Not on File)    History reviewed. No pertinent family history.  History   Social History  . Marital Status: Married    Spouse Name: N/A  . Number of Children: N/A  . Years of Education: N/A   Occupational History  . Not on file.   Social History Main Topics  . Smoking status: Former Research scientist (life sciences)  . Smokeless tobacco: Not on file  . Alcohol Use: Not on file  . Drug Use: Not on file  . Sexual Activity: Not on file   Other Topics Concern  . Not on file   Social History Narrative  . No narrative on file    Review of Systems: See HPI, otherwise negative ROS  Physical Exam: BP 173/82 mmHg  Pulse 62  Temp(Src) 98 F (36.7 C) (Oral)  Resp 18  Ht 6' (1.829 m)  Wt 81.647 kg (180 lb)  BMI 24.41 kg/m2  SpO2 98% General:   Alert,  pleasant and cooperative in NAD Head:  Normocephalic and atraumatic. Neck:   Supple; no masses or thyromegaly. Lungs:  Clear throughout to auscultation.    Heart:  Regular rate and rhythm. Abdomen:  Soft, mild epigastric tenderness and nondistended. Normal bowel sounds, without guarding, and without rebound.   Neurologic:  Alert and  oriented x4;  grossly normal neurologically.  Impression/Plan: [name] is here for an EGD to be performed for early satiety and abdominal bloating.  Risks, benefits, limitations, and alternatives regarding EGD have been reviewed with the patient.  Questions have been answered.  All parties agreeable.   [name], Lupita Dawn, MD  02/07/2015, 9:10 AM

## 2015-02-07 NOTE — Anesthesia Preprocedure Evaluation (Signed)
Anesthesia Evaluation  Patient identified by MRN, date of birth, ID band Patient awake    Reviewed: Allergy & Precautions, NPO status , Patient's Chart, lab work & pertinent test results  Airway Mallampati: II  TM Distance: >3 FB     Dental  (+) Chipped   Pulmonary former smoker,          Cardiovascular hypertension,     Neuro/Psych    GI/Hepatic   Endo/Other    Renal/GU      Musculoskeletal   Abdominal   Peds  Hematology   Anesthesia Other Findings Several teeth missing.  Reproductive/Obstetrics                             Anesthesia Physical Anesthesia Plan  ASA: III  Anesthesia Plan: General   Post-op Pain Management:    Induction: Intravenous  Airway Management Planned: Nasal Cannula  Additional Equipment:   Intra-op Plan:   Post-operative Plan:   Informed Consent: I have reviewed the patients History and Physical, chart, labs and discussed the procedure including the risks, benefits and alternatives for the proposed anesthesia with the patient or authorized representative who has indicated his/her understanding and acceptance.     Plan Discussed with: CRNA  Anesthesia Plan Comments:         Anesthesia Quick Evaluation

## 2015-02-07 NOTE — Anesthesia Postprocedure Evaluation (Signed)
  Anesthesia Post-op Note  Patient: [name]  Procedure(s) Performed: Procedure(s): ESOPHAGOGASTRODUODENOSCOPY (EGD) WITH PROPOFOL (N/A)  Anesthesia type:General  Patient location: PACU  Post pain: Pain level controlled  Post assessment: Post-op Vital signs reviewed, Patient's Cardiovascular Status Stable, Respiratory Function Stable, Patent Airway and No signs of Nausea or vomiting  Post vital signs: Reviewed and stable  Last Vitals:  Filed Vitals:   02/07/15 1040  BP:   Pulse: 56  Temp:   Resp: 14    Level of consciousness: awake, alert  and patient cooperative  Complications: No apparent anesthesia complications

## 2015-02-07 NOTE — Transfer of Care (Signed)
Immediate Anesthesia Transfer of Care Note  Patient: [name]  Procedure(s) Performed: Procedure(s): ESOPHAGOGASTRODUODENOSCOPY (EGD) WITH PROPOFOL (N/A)  Patient Location: PACU  Anesthesia Type:General  Level of Consciousness: awake  Airway & Oxygen Therapy: Patient Spontanous Breathing  Post-op Assessment: Report given to RN  Post vital signs: stable  Last Vitals:  Filed Vitals:   02/07/15 0859  BP: 173/82  Pulse: 62  Temp: 36.7 C  Resp: 18    Complications: No apparent anesthesia complications

## 2015-02-07 NOTE — Anesthesia Postprocedure Evaluation (Signed)
  Anesthesia Post-op Note  Patient: [name]  Procedure(s) Performed: Procedure(s): ESOPHAGOGASTRODUODENOSCOPY (EGD) WITH PROPOFOL (N/A)  Anesthesia type:General  Patient location: PACU  Post pain: Pain level controlled  Post assessment: Post-op Vital signs reviewed, Patient's Cardiovascular Status Stable, Respiratory Function Stable, Patent Airway and No signs of Nausea or vomiting  Post vital signs: Reviewed and stable  Last Vitals:  Filed Vitals:   02/07/15 0859  BP: 173/82  Pulse: 62  Temp: 36.7 C  Resp: 18    Level of consciousness: awake, alert  and patient cooperative  Complications: No apparent anesthesia complications

## 2015-02-08 ENCOUNTER — Encounter: Payer: Self-pay | Admitting: Gastroenterology

## 2015-02-10 LAB — SURGICAL PATHOLOGY

## 2015-04-04 ENCOUNTER — Other Ambulatory Visit: Payer: Self-pay | Admitting: Nephrology

## 2015-04-04 DIAGNOSIS — N179 Acute kidney failure, unspecified: Secondary | ICD-10-CM

## 2015-04-06 ENCOUNTER — Ambulatory Visit
Admission: RE | Admit: 2015-04-06 | Discharge: 2015-04-06 | Disposition: A | Discharge status: Home / Self Care | Payer: PPO | Source: Ambulatory Visit | Attending: Nephrology | Admitting: Nephrology

## 2015-04-06 DIAGNOSIS — N179 Acute kidney failure, unspecified: Secondary | ICD-10-CM | POA: Diagnosis not present

## 2015-04-06 DIAGNOSIS — N1330 Unspecified hydronephrosis: Secondary | ICD-10-CM | POA: Insufficient documentation

## 2015-04-06 DIAGNOSIS — N281 Cyst of kidney, acquired: Secondary | ICD-10-CM | POA: Diagnosis not present

## 2015-04-28 ENCOUNTER — Encounter: Payer: Self-pay | Admitting: Oncology

## 2015-04-28 ENCOUNTER — Inpatient Hospital Stay: Payer: PPO | Attending: Oncology | Admitting: Oncology

## 2015-04-28 VITALS — BP 169/82 | HR 59 | Temp 97.7°F | Resp 18 | Wt 176.6 lb

## 2015-04-28 DIAGNOSIS — E780 Pure hypercholesterolemia: Secondary | ICD-10-CM | POA: Diagnosis not present

## 2015-04-28 DIAGNOSIS — Z79899 Other long term (current) drug therapy: Secondary | ICD-10-CM

## 2015-04-28 DIAGNOSIS — I129 Hypertensive chronic kidney disease with stage 1 through stage 4 chronic kidney disease, or unspecified chronic kidney disease: Secondary | ICD-10-CM

## 2015-04-28 DIAGNOSIS — Z87891 Personal history of nicotine dependence: Secondary | ICD-10-CM | POA: Insufficient documentation

## 2015-04-28 DIAGNOSIS — N189 Chronic kidney disease, unspecified: Secondary | ICD-10-CM | POA: Diagnosis not present

## 2015-04-28 DIAGNOSIS — D472 Monoclonal gammopathy: Secondary | ICD-10-CM

## 2015-05-02 ENCOUNTER — Other Ambulatory Visit: Payer: Self-pay

## 2015-05-02 ENCOUNTER — Other Ambulatory Visit: Payer: Self-pay | Admitting: *Deleted

## 2015-05-02 DIAGNOSIS — D472 Monoclonal gammopathy: Secondary | ICD-10-CM | POA: Diagnosis not present

## 2015-05-02 LAB — PROTEIN, URINE, 24 HOUR
Collection Interval-UPROT: 24 hours
PROTEIN, 24H URINE: 4 mg/d — AB (ref 50–100)
PROTEIN, URINE: 15 mg/dL
Urine Total Volume-UPROT: 24 mL

## 2015-05-03 LAB — UIFE/LIGHT CHAINS/TP QN, 24-HR UR
% BETA, URINE: 26.9 %
ALBUMIN, U: 60.8 %
ALPHA 1 URINE: 3.5 %
ALPHA 2 UR: 3.6 %
Free Kappa/Lambda Ratio: 43.73 — ABNORMAL HIGH (ref 2.04–10.37)
Free Lambda Lt Chains,Ur: 8.69 mg/L — ABNORMAL HIGH (ref 0.24–6.66)
Free Lt Chn Excr Rate: 380 mg/L — ABNORMAL HIGH (ref 1.35–24.19)
GAMMA GLOBULIN URINE: 5.2 %
M-SPIKE %, URINE: 13.8 % — AB
M-Spike, Mg/24 Hr: 85 mg/24 hr — ABNORMAL HIGH
TOTAL PROTEIN, URINE-UPE24: 22.2 mg/dL
TOTAL PROTEIN, URINE-UR/DAY: 617.2 mg/(24.h) — AB (ref 30.0–150.0)

## 2015-05-05 NOTE — Progress Notes (Signed)
[place]  Telephone:([phone] Fax:([phone]  ID: [name] OB: 07-31-1931  MR#: [id number]  [id number]  Patient Care Team: Adrian Prows, MD as PCP - General (Infectious Diseases)  CHIEF COMPLAINT:  Chief Complaint  Patient presents with  . Follow-up    MGUS     INTERVAL HISTORY: Patient returns to clinic today for repeat laboratory and further evaluation.  He was referred back sooner than his regular six-month appointment after found to have worsening renal function and persistent abnormal UIEP.  He continues to feel well and remains asymptomatic. He has no neurologic complaints.  He denies any recent fevers or illnesses.  He has a good appetite and denies weight loss.  He has no chest pain or shortness of breath.  He denies any nausea, vomiting, constipation, or diarrhea.  He has no urinary complaints.  Patient offers no specific complaints today.  REVIEW OF SYSTEMS:   Review of Systems  Constitutional: Negative.   Respiratory: Negative.   Cardiovascular: Negative.   Genitourinary: Negative.     As per HPI. Otherwise, a complete review of systems is negatve.  PAST MEDICAL HISTORY: Past Medical History  Diagnosis Date  . Hypertension   . Hypercholesteremia   . CKD (chronic kidney disease)   . MGUS (monoclonal gammopathy of unknown significance)     PAST SURGICAL HISTORY: Past Surgical History  Procedure Laterality Date  . Cardiac valve replacement      pig valve  . Coronary angioplasty    . Bipass    . Cholecystectomy    . Joint replacement      left knee x4  . Esophagogastroduodenoscopy (egd) with propofol N/A 02/07/2015    Procedure: ESOPHAGOGASTRODUODENOSCOPY (EGD) WITH PROPOFOL;  Surgeon: Hulen Luster, MD;  Location: [facility] ENDOSCOPY;  Service: Gastroenterology;  Laterality: N/A;    FAMILY HISTORY: Reviewed and unchanged. No reported history of malignancy or chronic disease.     ADVANCED DIRECTIVES:    HEALTH  MAINTENANCE: Social History  Substance Use Topics  . Smoking status: Former Research scientist (life sciences)  . Smokeless tobacco: Never Used  . Alcohol Use: No     Colonoscopy:  PAP:  Bone density:  Lipid panel:  Allergies  Allergen Reactions  . Morphine Anaphylaxis  . Iodinated Diagnostic Agents     Other reaction(s): Asthenia (finding), Other (qualifier value) paralyzed legs    Current Outpatient Prescriptions  Medication Sig Dispense Refill  . amLODipine (NORVASC) 5 MG tablet Take 5 mg by mouth daily.    Marland Kitchen aspirin 81 MG tablet Take 81 mg by mouth daily.    . enalapril (VASOTEC) 10 MG tablet Take 10 mg by mouth 2 (two) times daily.    . Melatonin 3 MG TABS Take 1.5 mg by mouth at bedtime as needed (sleep).    . pravastatin (PRAVACHOL) 20 MG tablet Take 20 mg by mouth every evening.     No current facility-administered medications for this visit.    OBJECTIVE: Filed Vitals:   04/28/15 1032  BP: 169/82  Pulse: 59  Temp: 97.7 F (36.5 C)  Resp: 18     Body mass index is 23.94 kg/(m^2).    ECOG FS:0 - Asymptomatic  General: Well-developed, well-nourished, no acute distress. Eyes: Pink conjunctiva, anicteric sclera. Lungs: Clear to auscultation bilaterally. Heart: Regular rate and rhythm. No rubs, murmurs, or gallops. Abdomen: Soft, nontender, nondistended. No organomegaly noted, normoactive bowel sounds. Musculoskeletal: No edema, cyanosis, or clubbing. Neuro: Alert, answering all questions appropriately. Cranial nerves grossly intact. Skin:  No rashes or petechiae noted. Psych: Normal affect.   LAB RESULTS:  Lab Results  Component Value Date   NA 126* 12/09/2014   K 4.7 12/09/2014   CL 95* 12/09/2014   CO2 26 12/09/2014   GLUCOSE 95 12/09/2014   BUN 28* 12/09/2014   CREATININE 1.64* 12/09/2014   CALCIUM 8.5* 12/09/2014   PROT 6.7 02/16/2010   ALBUMIN 4.0 02/16/2010   AST 27 02/16/2010   ALT 13 02/16/2010   ALKPHOS 73 02/16/2010   BILITOT 1.1 02/16/2010   GFRNONAA 39*  12/09/2014   GFRAA 45* 12/09/2014    Lab Results  Component Value Date   WBC 5.6 12/09/2014   NEUTROABS 3.5 12/09/2014   HGB 12.3* 12/09/2014   HCT 36.7* 12/09/2014   MCV 95 12/09/2014   PLT 189 12/09/2014     STUDIES: US Renal  04/06/2015   CLINICAL DATA:  Acute renal failure.  Elevated creatinine.  EXAM: RENAL / URINARY TRACT ULTRASOUND COMPLETE  COMPARISON:  Comparison is made to ultrasound report dated 08/03/2004. Comparison is also made to a CT of the abdomen dated 02/20/2013.  FINDINGS: Right Kidney:  Length: 13 cm. There is mild hydronephrosis of the right kidney. Simple-appearing cyst is seen exophytic to the upper pole cortex measuring 2.6 x 2 cm. No solid masses identified within the right kidney. Echogenicity of the right kidney is within normal limits.  Left Kidney:  Length: 9.9 cm. There is a lobular cyst, with septation, exophytic to the midpole cortex of the left kidney, measuring 4.3 x 4 x 4.9 cm. I suspect that this is actually 2 abutting cysts with the largest component measuring 4.9 x 2.2 cm and the smaller component measuring 2.2 x 1.3 cm. Left kidney appears otherwise normal in echogenicity without stone or hydronephrosis.  Bladder:  Appears normal for degree of bladder distention.  IMPRESSION: Mild right-sided hydronephrosis. A similar mild hydronephrosis was described on a previous ultrasound report dated 08/03/2004.  Bilateral renal cysts, as detailed above. The cystic focus within the left kidney could be one lobular cyst with internal septation compatible with a Bosniak 2 F category or, more likely, 2 abutting cysts. As such, would recommend follow-up renal ultrasound in 3-6 months to ensure stability.   Electronically Signed   By: Franki Cabot M.D.   On: 04/06/2015 16:20    ASSESSMENT: MGUS  PLAN:    1.  MGUS: Patient's creatinine has increased from his baseline and is now up to 2.45. The M spike in his urine in March was 2.9% and has now increased to 13.8%. His  24-hour M spike was 33.6 mg per 24 hours in March which has now increased to 85 mg per 24 hours. It is unclear if the increase in his protein is related to his increased creatinine, will further discuss with nephrology to determine if a renal biopsy is necessary at this point. Renal ultrasound as above. Metastatic bone survey was negative.  He has no other evidence of endorgan damage.  Return to clinic in 6 months for repeat laboratory work and further evaluation.  Patient expressed understanding and was in agreement with this plan. 2. Renal insufficiency: Consider biopsy as above. 3. Hypertension: Patient's blood pressure slightly elevated today, treatment per primary care.  Patient expressed understanding and was in agreement with this plan. He also understands that He can call clinic at any time with any questions, concerns, or complaints.     Lloyd Huger, MD   05/05/2015 9:04 AM

## 2015-05-09 ENCOUNTER — Other Ambulatory Visit: Payer: Self-pay | Admitting: Oncology

## 2015-05-09 DIAGNOSIS — D472 Monoclonal gammopathy: Secondary | ICD-10-CM

## 2015-05-11 DIAGNOSIS — I071 Rheumatic tricuspid insufficiency: Secondary | ICD-10-CM | POA: Insufficient documentation

## 2015-05-13 ENCOUNTER — Telehealth: Payer: Self-pay | Admitting: *Deleted

## 2015-05-13 NOTE — Telephone Encounter (Signed)
Had question regarding bone marrow biopsy and CT scan scheduled for 9/1 asking which he is supposed to have. I explained that he will have the bone marrow done under CT Guidance He than asked about directions for prep, L Torain conferenced in to call and she said nothing to eat or drink after midnight and Specials will call patient day before procedure to go over instructions with him. He thanked [name]

## 2015-05-18 ENCOUNTER — Other Ambulatory Visit: Payer: Self-pay | Admitting: Physician Assistant

## 2015-05-19 ENCOUNTER — Ambulatory Visit
Admission: RE | Admit: 2015-05-19 | Discharge: 2015-05-19 | Disposition: A | Discharge status: Home / Self Care | Payer: PPO | Source: Ambulatory Visit | Attending: Oncology | Admitting: Oncology

## 2015-05-19 DIAGNOSIS — D472 Monoclonal gammopathy: Secondary | ICD-10-CM

## 2015-05-19 DIAGNOSIS — K219 Gastro-esophageal reflux disease without esophagitis: Secondary | ICD-10-CM | POA: Insufficient documentation

## 2015-05-19 DIAGNOSIS — I10 Essential (primary) hypertension: Secondary | ICD-10-CM | POA: Insufficient documentation

## 2015-05-19 DIAGNOSIS — Z952 Presence of prosthetic heart valve: Secondary | ICD-10-CM | POA: Insufficient documentation

## 2015-05-19 DIAGNOSIS — Z9861 Coronary angioplasty status: Secondary | ICD-10-CM | POA: Insufficient documentation

## 2015-05-19 DIAGNOSIS — N189 Chronic kidney disease, unspecified: Secondary | ICD-10-CM | POA: Insufficient documentation

## 2015-05-19 DIAGNOSIS — I719 Aortic aneurysm of unspecified site, without rupture: Secondary | ICD-10-CM | POA: Diagnosis not present

## 2015-05-19 DIAGNOSIS — E780 Pure hypercholesterolemia: Secondary | ICD-10-CM | POA: Insufficient documentation

## 2015-05-19 DIAGNOSIS — D649 Anemia, unspecified: Secondary | ICD-10-CM | POA: Diagnosis not present

## 2015-05-19 HISTORY — DX: Gastro-esophageal reflux disease without esophagitis: K21.9

## 2015-05-19 HISTORY — DX: Unspecified osteoarthritis, unspecified site: M19.90

## 2015-05-19 HISTORY — DX: Aortic aneurysm of unspecified site, without rupture: I71.9

## 2015-05-19 HISTORY — DX: Anemia, unspecified: D64.9

## 2015-05-19 LAB — DIFFERENTIAL
BLASTS: 0 %
Band Neutrophils: 0 % (ref 0–10)
Basophils Absolute: 0 10*3/uL (ref 0–0.1)
Basophils Relative: 0 % (ref 0–1)
EOS PCT: 8 % — AB (ref 0–5)
Eosinophils Absolute: 0.5 10*3/uL (ref 0–0.7)
LYMPHS PCT: 16 % (ref 12–46)
Lymphs Abs: 1.1 10*3/uL (ref 1.0–3.6)
MONO ABS: 0.8 10*3/uL (ref 0.2–1.0)
MYELOCYTES: 0 %
Metamyelocytes Relative: 0 %
Monocytes Relative: 12 % (ref 3–12)
NEUTROS ABS: 4.4 10*3/uL (ref 1.4–6.5)
NEUTROS PCT: 64 % (ref 43–77)
NRBC: 0 /100{WBCs}
OTHER: 0 %
Promyelocytes Absolute: 0 %

## 2015-05-19 LAB — CBC WITH DIFFERENTIAL/PLATELET
Basophils Absolute: 0 10*3/uL (ref 0–0.1)
Basophils Relative: 1 %
EOS ABS: 0.5 10*3/uL (ref 0–0.7)
EOS PCT: 7 %
HCT: 37.9 % — ABNORMAL LOW (ref 40.0–52.0)
HEMOGLOBIN: 12.4 g/dL — AB (ref 13.0–18.0)
LYMPHS ABS: 1.2 10*3/uL (ref 1.0–3.6)
LYMPHS PCT: 18 %
MCH: 31.7 pg (ref 26.0–34.0)
MCHC: 32.8 g/dL (ref 32.0–36.0)
MCV: 96.5 fL (ref 80.0–100.0)
MONOS PCT: 14 %
Monocytes Absolute: 0.9 10*3/uL (ref 0.2–1.0)
Neutro Abs: 4.2 10*3/uL (ref 1.4–6.5)
Neutrophils Relative %: 60 %
PLATELETS: 213 10*3/uL (ref 150–440)
RBC: 3.93 MIL/uL — ABNORMAL LOW (ref 4.40–5.90)
RDW: 14.4 % (ref 11.5–14.5)
WBC: 6.8 10*3/uL (ref 3.8–10.6)

## 2015-05-19 LAB — PROTIME-INR
INR: 1.08
Prothrombin Time: 14.2 seconds (ref 11.4–15.0)

## 2015-05-19 LAB — APTT: APTT: 29 s (ref 24–36)

## 2015-05-19 MED ORDER — MIDAZOLAM HCL 5 MG/5ML IJ SOLN
INTRAMUSCULAR | Status: AC | PRN
  Administered 2015-05-19: 1 mg via INTRAVENOUS
  Administered 2015-05-19: 0.5 mg via INTRAVENOUS

## 2015-05-19 MED ORDER — SODIUM CHLORIDE 0.9 % IV SOLN
INTRAVENOUS | Status: DC
  Administered 2015-05-19: 10:00:00 via INTRAVENOUS

## 2015-05-19 NOTE — Procedures (Signed)
Under CT guidance, bone marrow aspirate and biopsy of right iliac bone was performed. No immediate complication.

## 2015-05-19 NOTE — Procedures (Signed)
Procedure and risks discussed with patient and family. Informed consent obtained. Will perform CT-guided bone marrow biopsy.

## 2015-05-26 ENCOUNTER — Telehealth: Payer: Self-pay | Admitting: *Deleted

## 2015-05-26 ENCOUNTER — Ambulatory Visit: Payer: PPO | Admitting: Oncology

## 2015-05-26 NOTE — Telephone Encounter (Signed)
Notified pt of nml results and that Dr Grayland Ormond was sending results to r [facility]

## 2015-05-26 NOTE — Telephone Encounter (Signed)
BMbx results negative for MM. Have forwarded result to Dr. Holley Raring as well.

## 2015-05-26 NOTE — Telephone Encounter (Signed)
would like results of Bone Marrow Biopsy from last week

## 2015-06-02 ENCOUNTER — Inpatient Hospital Stay: Payer: PPO | Admitting: Oncology

## 2015-06-02 ENCOUNTER — Telehealth: Payer: Self-pay | Admitting: *Deleted

## 2015-06-02 NOTE — Telephone Encounter (Signed)
I spoke with patient regarding follow up appointment scheduled for today. Patient received bone marrow results over the phone last week. Bone marrow biopsy was normal, no indication for intervention from Oncology at this time. Dr. Grayland Ormond spoke with Dr. Holley Raring regarding results. Patient voiced agreement for cancelling appointment today as he did not have any questions for Dr. Grayland Ormond. Patient has follow up with Dr. Grayland Ormond as well as Dr. Holley Raring at this time.

## 2015-06-28 ENCOUNTER — Encounter
Admission: RE | Admit: 2015-06-28 | Discharge: 2015-06-28 | Disposition: A | Discharge status: Home / Self Care | Payer: PPO | Source: Ambulatory Visit | Attending: Nephrology | Admitting: Nephrology

## 2015-06-28 DIAGNOSIS — R809 Proteinuria, unspecified: Secondary | ICD-10-CM | POA: Diagnosis not present

## 2015-06-28 DIAGNOSIS — M1990 Unspecified osteoarthritis, unspecified site: Secondary | ICD-10-CM | POA: Diagnosis not present

## 2015-06-28 DIAGNOSIS — Z8249 Family history of ischemic heart disease and other diseases of the circulatory system: Secondary | ICD-10-CM | POA: Diagnosis not present

## 2015-06-28 DIAGNOSIS — Z7982 Long term (current) use of aspirin: Secondary | ICD-10-CM | POA: Diagnosis not present

## 2015-06-28 DIAGNOSIS — N189 Chronic kidney disease, unspecified: Secondary | ICD-10-CM | POA: Diagnosis present

## 2015-06-28 DIAGNOSIS — N183 Chronic kidney disease, stage 3 (moderate): Secondary | ICD-10-CM | POA: Diagnosis not present

## 2015-06-28 DIAGNOSIS — I2510 Atherosclerotic heart disease of native coronary artery without angina pectoris: Secondary | ICD-10-CM | POA: Diagnosis not present

## 2015-06-28 DIAGNOSIS — Z885 Allergy status to narcotic agent status: Secondary | ICD-10-CM | POA: Diagnosis not present

## 2015-06-28 DIAGNOSIS — I714 Abdominal aortic aneurysm, without rupture: Secondary | ICD-10-CM | POA: Diagnosis not present

## 2015-06-28 DIAGNOSIS — E872 Acidosis: Secondary | ICD-10-CM | POA: Diagnosis not present

## 2015-06-28 DIAGNOSIS — Z91041 Radiographic dye allergy status: Secondary | ICD-10-CM | POA: Diagnosis not present

## 2015-06-28 DIAGNOSIS — G4700 Insomnia, unspecified: Secondary | ICD-10-CM | POA: Diagnosis not present

## 2015-06-28 DIAGNOSIS — K5790 Diverticulosis of intestine, part unspecified, without perforation or abscess without bleeding: Secondary | ICD-10-CM | POA: Diagnosis not present

## 2015-06-28 DIAGNOSIS — Z79899 Other long term (current) drug therapy: Secondary | ICD-10-CM | POA: Diagnosis not present

## 2015-06-28 DIAGNOSIS — K449 Diaphragmatic hernia without obstruction or gangrene: Secondary | ICD-10-CM | POA: Diagnosis not present

## 2015-06-28 DIAGNOSIS — Z952 Presence of prosthetic heart valve: Secondary | ICD-10-CM | POA: Diagnosis not present

## 2015-06-28 DIAGNOSIS — I1310 Hypertensive heart and chronic kidney disease without heart failure, with stage 1 through stage 4 chronic kidney disease, or unspecified chronic kidney disease: Secondary | ICD-10-CM | POA: Diagnosis not present

## 2015-06-28 DIAGNOSIS — K5900 Constipation, unspecified: Secondary | ICD-10-CM | POA: Diagnosis not present

## 2015-06-28 DIAGNOSIS — Z833 Family history of diabetes mellitus: Secondary | ICD-10-CM | POA: Diagnosis not present

## 2015-06-28 DIAGNOSIS — N281 Cyst of kidney, acquired: Secondary | ICD-10-CM | POA: Diagnosis not present

## 2015-06-28 LAB — DIFFERENTIAL
BASOS ABS: 0.1 10*3/uL (ref 0–0.1)
Basophils Relative: 1 %
Eosinophils Absolute: 0.4 10*3/uL (ref 0–0.7)
Eosinophils Relative: 7 %
LYMPHS ABS: 1 10*3/uL (ref 1.0–3.6)
LYMPHS PCT: 18 %
MONO ABS: 0.8 10*3/uL (ref 0.2–1.0)
MONOS PCT: 14 %
NEUTROS ABS: 3.5 10*3/uL (ref 1.4–6.5)
Neutrophils Relative %: 60 %

## 2015-06-28 LAB — COMPREHENSIVE METABOLIC PANEL
ALK PHOS: 70 U/L (ref 38–126)
ALT: 14 U/L — AB (ref 17–63)
AST: 22 U/L (ref 15–41)
Albumin: 4 g/dL (ref 3.5–5.0)
Anion gap: 9 (ref 5–15)
BILIRUBIN TOTAL: 0.8 mg/dL (ref 0.3–1.2)
BUN: 24 mg/dL — AB (ref 6–20)
CALCIUM: 9.2 mg/dL (ref 8.9–10.3)
CHLORIDE: 95 mmol/L — AB (ref 101–111)
CO2: 24 mmol/L (ref 22–32)
CREATININE: 1.73 mg/dL — AB (ref 0.61–1.24)
GFR, EST AFRICAN AMERICAN: 41 mL/min — AB (ref >60)
GFR, EST NON AFRICAN AMERICAN: 35 mL/min — AB (ref >60)
Glucose, Bld: 104 mg/dL — ABNORMAL HIGH (ref 65–99)
Potassium: 5 mmol/L (ref 3.5–5.1)
Sodium: 128 mmol/L — ABNORMAL LOW (ref 135–145)
TOTAL PROTEIN: 6.9 g/dL (ref 6.5–8.1)

## 2015-06-28 LAB — URINALYSIS COMPLETE WITH MICROSCOPIC (ARMC ONLY)
BILIRUBIN URINE: NEGATIVE
Bacteria, UA: NONE SEEN
GLUCOSE, UA: NEGATIVE mg/dL
Hgb urine dipstick: NEGATIVE
Ketones, ur: NEGATIVE mg/dL
Leukocytes, UA: NEGATIVE
Nitrite: NEGATIVE
Protein, ur: NEGATIVE mg/dL
SPECIFIC GRAVITY, URINE: 1.005 (ref 1.005–1.030)
pH: 7 (ref 5.0–8.0)

## 2015-06-28 LAB — CBC
HEMATOCRIT: 38.1 % — AB (ref 40.0–52.0)
Hemoglobin: 12.9 g/dL — ABNORMAL LOW (ref 13.0–18.0)
MCH: 32.6 pg (ref 26.0–34.0)
MCHC: 33.8 g/dL (ref 32.0–36.0)
MCV: 96.6 fL (ref 80.0–100.0)
Platelets: 213 10*3/uL (ref 150–440)
RBC: 3.95 MIL/uL — ABNORMAL LOW (ref 4.40–5.90)
RDW: 13.2 % (ref 11.5–14.5)
WBC: 5.9 10*3/uL (ref 3.8–10.6)

## 2015-06-28 LAB — PROTEIN / CREATININE RATIO, URINE
CREATININE, URINE: 35 mg/dL
Protein Creatinine Ratio: 0.54 mg/mg{Cre} — ABNORMAL HIGH (ref 0.00–0.15)
Total Protein, Urine: 19 mg/dL

## 2015-06-28 LAB — ABO/RH: ABO/RH(D): A POS

## 2015-06-28 LAB — PROTIME-INR
INR: 1.03
Prothrombin Time: 13.7 seconds (ref 11.4–15.0)

## 2015-06-28 LAB — TYPE AND SCREEN
ABO/RH(D): A POS
ANTIBODY SCREEN: NEGATIVE

## 2015-06-30 ENCOUNTER — Observation Stay: Payer: PPO

## 2015-06-30 ENCOUNTER — Observation Stay
Admission: AD | Admit: 2015-06-30 | Discharge: 2015-07-01 | Disposition: A | Discharge status: Home / Self Care | Payer: PPO | Source: Ambulatory Visit | Attending: Nephrology | Admitting: Nephrology

## 2015-06-30 DIAGNOSIS — I1310 Hypertensive heart and chronic kidney disease without heart failure, with stage 1 through stage 4 chronic kidney disease, or unspecified chronic kidney disease: Principal | ICD-10-CM | POA: Insufficient documentation

## 2015-06-30 DIAGNOSIS — I714 Abdominal aortic aneurysm, without rupture: Secondary | ICD-10-CM | POA: Insufficient documentation

## 2015-06-30 DIAGNOSIS — Z833 Family history of diabetes mellitus: Secondary | ICD-10-CM | POA: Insufficient documentation

## 2015-06-30 DIAGNOSIS — G4700 Insomnia, unspecified: Secondary | ICD-10-CM | POA: Insufficient documentation

## 2015-06-30 DIAGNOSIS — Z91041 Radiographic dye allergy status: Secondary | ICD-10-CM | POA: Insufficient documentation

## 2015-06-30 DIAGNOSIS — N281 Cyst of kidney, acquired: Secondary | ICD-10-CM | POA: Insufficient documentation

## 2015-06-30 DIAGNOSIS — K449 Diaphragmatic hernia without obstruction or gangrene: Secondary | ICD-10-CM | POA: Insufficient documentation

## 2015-06-30 DIAGNOSIS — N183 Chronic kidney disease, stage 3 unspecified: Secondary | ICD-10-CM | POA: Diagnosis present

## 2015-06-30 DIAGNOSIS — I2510 Atherosclerotic heart disease of native coronary artery without angina pectoris: Secondary | ICD-10-CM | POA: Insufficient documentation

## 2015-06-30 DIAGNOSIS — Z7982 Long term (current) use of aspirin: Secondary | ICD-10-CM | POA: Insufficient documentation

## 2015-06-30 DIAGNOSIS — Z8249 Family history of ischemic heart disease and other diseases of the circulatory system: Secondary | ICD-10-CM | POA: Insufficient documentation

## 2015-06-30 DIAGNOSIS — K5900 Constipation, unspecified: Secondary | ICD-10-CM | POA: Insufficient documentation

## 2015-06-30 DIAGNOSIS — Z952 Presence of prosthetic heart valve: Secondary | ICD-10-CM | POA: Insufficient documentation

## 2015-06-30 DIAGNOSIS — K5790 Diverticulosis of intestine, part unspecified, without perforation or abscess without bleeding: Secondary | ICD-10-CM | POA: Insufficient documentation

## 2015-06-30 DIAGNOSIS — M1990 Unspecified osteoarthritis, unspecified site: Secondary | ICD-10-CM | POA: Insufficient documentation

## 2015-06-30 DIAGNOSIS — Z885 Allergy status to narcotic agent status: Secondary | ICD-10-CM | POA: Insufficient documentation

## 2015-06-30 DIAGNOSIS — E872 Acidosis: Secondary | ICD-10-CM | POA: Insufficient documentation

## 2015-06-30 DIAGNOSIS — Z79899 Other long term (current) drug therapy: Secondary | ICD-10-CM | POA: Insufficient documentation

## 2015-06-30 DIAGNOSIS — R809 Proteinuria, unspecified: Secondary | ICD-10-CM | POA: Insufficient documentation

## 2015-06-30 LAB — CBC
HCT: 37.1 % — ABNORMAL LOW (ref 40.0–52.0)
HEMOGLOBIN: 12.4 g/dL — AB (ref 13.0–18.0)
MCH: 32.3 pg (ref 26.0–34.0)
MCHC: 33.4 g/dL (ref 32.0–36.0)
MCV: 96.6 fL (ref 80.0–100.0)
Platelets: 211 10*3/uL (ref 150–440)
RBC: 3.84 MIL/uL — AB (ref 4.40–5.90)
RDW: 13.4 % (ref 11.5–14.5)
WBC: 6.8 10*3/uL (ref 3.8–10.6)

## 2015-06-30 MED ORDER — ACETAMINOPHEN 325 MG PO TABS: 650.0000 mg | ORAL_TABLET | ORAL | Status: DC | PRN

## 2015-06-30 MED ORDER — SODIUM CHLORIDE 0.9 % IV SOLN: INTRAVENOUS | Status: DC

## 2015-06-30 MED ORDER — SODIUM CHLORIDE 0.9 % IV SOLN
INTRAVENOUS | Status: DC
  Administered 2015-06-30: 10:00:00 via INTRAVENOUS

## 2015-06-30 MED ORDER — HYDRALAZINE HCL 20 MG/ML IJ SOLN: 10.0000 mg | Freq: Once | INTRAMUSCULAR | Status: DC

## 2015-06-30 MED ORDER — INFLUENZA VAC SPLIT QUAD 0.5 ML IM SUSY: 0.5000 mL | PREFILLED_SYRINGE | INTRAMUSCULAR | Status: DC

## 2015-06-30 MED ORDER — HYDRALAZINE HCL 20 MG/ML IJ SOLN
INTRAMUSCULAR | Status: AC
  Administered 2015-06-30: 11:00:00 20 mg
  Filled 2015-06-30: qty 1

## 2015-06-30 NOTE — Progress Notes (Signed)
Pt s/p kidney biopsy on right today, dressing noted with small amt of blood, marked with no extension noted, pt voided x 3 after procedure and urine kept for MD to view color, urine clear yellow, no evidence of clots or bleeding, pt without c/o pain.

## 2015-06-30 NOTE — Procedures (Signed)
After obtaining informed consent, the patient was brought down to the ultrasound suite. Subsequently the right kidney was identified under ultrasound. The right flank was prepped and draped in standard sterile fashion. Local anesthesia was achieved using 1% lidocaine. Subsequently using an 18-gauge biopsy device and ultraound guidance, a total of 4 passes were made into the left kidney. The specimens were submitted to pathology and deemed to be adequate for submission. No active bleeding noted on post-biopsy images.   The patient tolerated the procedure very well. He will return to his room for continued observation.   Estimated blood loss: None.  Complications: None.

## 2015-07-01 ENCOUNTER — Encounter: Payer: Self-pay | Admitting: Nephrology

## 2015-07-01 DIAGNOSIS — I1310 Hypertensive heart and chronic kidney disease without heart failure, with stage 1 through stage 4 chronic kidney disease, or unspecified chronic kidney disease: Secondary | ICD-10-CM | POA: Diagnosis not present

## 2015-07-01 LAB — CBC
HCT: 38.2 % — ABNORMAL LOW (ref 40.0–52.0)
Hemoglobin: 13 g/dL (ref 13.0–18.0)
MCH: 33.1 pg (ref 26.0–34.0)
MCHC: 34 g/dL (ref 32.0–36.0)
MCV: 97.3 fL (ref 80.0–100.0)
PLATELETS: 215 10*3/uL (ref 150–440)
RBC: 3.92 MIL/uL — ABNORMAL LOW (ref 4.40–5.90)
RDW: 13.2 % (ref 11.5–14.5)
WBC: 7.6 10*3/uL (ref 3.8–10.6)

## 2015-07-01 LAB — BASIC METABOLIC PANEL
Anion gap: 7 (ref 5–15)
BUN: 32 mg/dL — AB (ref 6–20)
CALCIUM: 9 mg/dL (ref 8.9–10.3)
CO2: 22 mmol/L (ref 22–32)
CREATININE: 1.63 mg/dL — AB (ref 0.61–1.24)
Chloride: 100 mmol/L — ABNORMAL LOW (ref 101–111)
GFR calc Af Amer: 44 mL/min — ABNORMAL LOW (ref >60)
GFR, EST NON AFRICAN AMERICAN: 38 mL/min — AB (ref >60)
Glucose, Bld: 97 mg/dL (ref 65–99)
Potassium: 4.2 mmol/L (ref 3.5–5.1)
SODIUM: 129 mmol/L — AB (ref 135–145)

## 2015-07-01 NOTE — Plan of Care (Signed)
Problem: Discharge Progression Outcomes Goal: Other Discharge Outcomes/Goals Outcome: Progressing Pain: denies Diet: tolerating ordered diet Hemodynamics: vital signs stable Activity: Pt up ad lib. No issues with mobility. Dressing to right flank clean, dry and intact. Area of old drainage marked.  Direct admit for renal biopsy yesterday. Most likely will be discharged home today.

## 2015-07-01 NOTE — Final Progress Note (Signed)
[facility]  ROUNDING NOTE   Subjective:  Doing well post biopsy. No flank pain. Hemoglobin stable.  No complaints. Looking forward to going home.    Objective:  Vital signs in last 24 hours:  Temp:  [97.8 F (36.6 C)-98.2 F (36.8 C)] 98.2 F (36.8 C) (10/14 0531) Pulse Rate:  [69-80] 80 (10/14 0531) Resp:  [17-19] 18 (10/14 0531) BP: (122-135)/(55-73) 135/73 mmHg (10/14 0531) SpO2:  [96 %-100 %] 96 % (10/14 0531)  Weight change:  Filed Weights   06/30/15 0700  Weight: 80.287 kg (177 lb)    Intake/Output: I/O last 3 completed shifts: In: 480 [P.O.:480] Out: 1850 [Urine:1850]   Intake/Output this shift:  Total I/O In: 672.7 [P.O.:240; I.V.:432.7] Out: -   Physical Exam: General: NAD  Head: Normocephalic, atraumatic. Moist oral mucosal membranes  Eyes: Anicteric, PERRL  Neck: Supple, trachea midline  Lungs:  Clear to auscultation  Heart: Regular rate and rhythm  Abdomen:  Soft, nontender,   Extremities:  no peripheral edema.  Neurologic: Nonfocal, moving all four extremities  Skin: No lesions       Basic Metabolic Panel:  Recent Labs Lab 06/28/15 0913 07/01/15 0526  NA 128* 129*  K 5.0 4.2  CL 95* 100*  CO2 24 22  GLUCOSE 104* 97  BUN 24* 32*  CREATININE 1.73* 1.63*  CALCIUM 9.2 9.0    Liver Function Tests:  Recent Labs Lab 06/28/15 0913  AST 22  ALT 14*  ALKPHOS 70  BILITOT 0.8  PROT 6.9  ALBUMIN 4.0   No results for input(s): LIPASE, AMYLASE in the last 168 hours. No results for input(s): AMMONIA in the last 168 hours.  CBC:  Recent Labs Lab 06/28/15 0913 06/30/15 1620 07/01/15 0526  WBC 5.9 6.8 7.6  NEUTROABS 3.5  --   --   HGB 12.9* 12.4* 13.0  HCT 38.1* 37.1* 38.2*  MCV 96.6 96.6 97.3  PLT 213 211 215    Cardiac Enzymes: No results for input(s): CKTOTAL, CKMB, CKMBINDEX, TROPONINI in the last 168 hours.  BNP: Invalid input(s): POCBNP  CBG: No results for input(s): GLUCAP in the last 168  hours.  Microbiology: Results for orders placed or performed during the hospital encounter of 02/16/10  Surgical pcr screen     Status: None   Collection Time: 02/16/10  1:36 PM  Result Value Ref Range Status   MRSA, PCR NEGATIVE NEGATIVE Final   Staphylococcus aureus  NEGATIVE Final    NEGATIVE        The Xpert SA Assay (FDA approved for NASAL specimens only), is one component of a comprehensive surveillance program.  It is not intended to diagnose infection nor to guide or monitor treatment.    Coagulation Studies: No results for input(s): LABPROT, INR in the last 72 hours.  Urinalysis: No results for input(s): COLORURINE, LABSPEC, PHURINE, GLUCOSEU, HGBUR, BILIRUBINUR, KETONESUR, PROTEINUR, UROBILINOGEN, NITRITE, LEUKOCYTESUR in the last 72 hours.  Invalid input(s): APPERANCEUR    Imaging: US Biopsy-no Radiologist  06/30/2015  CLINICAL DATA:  Ultrasound was utilized for biopsy by the requesting physician.     Medications:   . sodium chloride 20 mL/hr at 06/30/15 0948   . hydrALAZINE  10 mg Intravenous Once  . Influenza vac split quadrivalent PF  0.5 mL Intramuscular Tomorrow-1000   acetaminophen  Assessment/ Plan:  81 y.o. male with CKD stage III, proteinuria, MGUS, left renal cysts.  1. CKD stage III. 2.  Proteinuria.  Plan:  Pt s/p percutaneous left renal biopsy,  doing well, no flank pain, hemoglobin stable.  No heavy lifting greater than 5 lbs x 2 weeks, may resume aspirin 3 days post discharge.  [place] for discharge to home.  Will prepare orders.    LOS:  Synethia Endicott 10/14/201611:05 AM

## 2015-07-01 NOTE — Plan of Care (Signed)
Problem: Discharge Progression Outcomes Goal: Other Discharge Outcomes/Goals Outcome: Adequate for Discharge Pain had no c/o pain this shift VSS Patient is independent in ambulation  Biopsy site is dry and intact with no bleeding  Received MD order to discharge patient to home, reviewed medications and discharge instructions with patient and patient verbalized understanding discharged in wheelchair with family member to home

## 2015-07-07 ENCOUNTER — Encounter: Payer: Self-pay | Admitting: Nephrology

## 2015-07-07 LAB — SURGICAL PATHOLOGY

## 2015-07-16 ENCOUNTER — Encounter: Payer: Self-pay | Admitting: Oncology

## 2015-10-31 ENCOUNTER — Inpatient Hospital Stay: Payer: PPO | Attending: Oncology

## 2015-10-31 DIAGNOSIS — Z79899 Other long term (current) drug therapy: Secondary | ICD-10-CM | POA: Diagnosis not present

## 2015-10-31 DIAGNOSIS — E7800 Pure hypercholesterolemia, unspecified: Secondary | ICD-10-CM | POA: Insufficient documentation

## 2015-10-31 DIAGNOSIS — M1990 Unspecified osteoarthritis, unspecified site: Secondary | ICD-10-CM | POA: Diagnosis not present

## 2015-10-31 DIAGNOSIS — D472 Monoclonal gammopathy: Secondary | ICD-10-CM | POA: Diagnosis not present

## 2015-10-31 DIAGNOSIS — K219 Gastro-esophageal reflux disease without esophagitis: Secondary | ICD-10-CM | POA: Diagnosis not present

## 2015-10-31 DIAGNOSIS — Z87891 Personal history of nicotine dependence: Secondary | ICD-10-CM | POA: Insufficient documentation

## 2015-10-31 DIAGNOSIS — I129 Hypertensive chronic kidney disease with stage 1 through stage 4 chronic kidney disease, or unspecified chronic kidney disease: Secondary | ICD-10-CM | POA: Insufficient documentation

## 2015-10-31 DIAGNOSIS — N189 Chronic kidney disease, unspecified: Secondary | ICD-10-CM | POA: Insufficient documentation

## 2015-10-31 LAB — CBC
HCT: 39.2 % — ABNORMAL LOW (ref 40.0–52.0)
HEMOGLOBIN: 13.5 g/dL (ref 13.0–18.0)
MCH: 32.3 pg (ref 26.0–34.0)
MCHC: 34.4 g/dL (ref 32.0–36.0)
MCV: 94.1 fL (ref 80.0–100.0)
PLATELETS: 193 10*3/uL (ref 150–440)
RBC: 4.16 MIL/uL — AB (ref 4.40–5.90)
RDW: 14.2 % (ref 11.5–14.5)
WBC: 5.4 10*3/uL (ref 3.8–10.6)

## 2015-10-31 LAB — BASIC METABOLIC PANEL
ANION GAP: 6 (ref 5–15)
BUN: 25 mg/dL — AB (ref 6–20)
CHLORIDE: 99 mmol/L — AB (ref 101–111)
CO2: 22 mmol/L (ref 22–32)
Calcium: 8.8 mg/dL — ABNORMAL LOW (ref 8.9–10.3)
Creatinine, Ser: 1.6 mg/dL — ABNORMAL HIGH (ref 0.61–1.24)
GFR calc Af Amer: 45 mL/min — ABNORMAL LOW (ref >60)
GFR, EST NON AFRICAN AMERICAN: 38 mL/min — AB (ref >60)
GLUCOSE: 105 mg/dL — AB (ref 65–99)
POTASSIUM: 4.3 mmol/L (ref 3.5–5.1)
SODIUM: 127 mmol/L — AB (ref 135–145)

## 2015-11-01 LAB — PROTEIN ELECTROPHORESIS, SERUM
A/G RATIO SPE: 1.4 (ref 0.7–1.7)
ALBUMIN ELP: 3.8 g/dL (ref 2.9–4.4)
Alpha-1-Globulin: 0.3 g/dL (ref 0.0–0.4)
Alpha-2-Globulin: 0.7 g/dL (ref 0.4–1.0)
BETA GLOBULIN: 0.8 g/dL (ref 0.7–1.3)
Gamma Globulin: 0.9 g/dL (ref 0.4–1.8)
Globulin, Total: 2.7 g/dL (ref 2.2–3.9)
TOTAL PROTEIN ELP: 6.5 g/dL (ref 6.0–8.5)

## 2015-11-07 ENCOUNTER — Inpatient Hospital Stay (HOSPITAL_BASED_OUTPATIENT_CLINIC_OR_DEPARTMENT_OTHER): Payer: PPO | Admitting: Oncology

## 2015-11-07 VITALS — BP 173/77 | HR 66 | Temp 97.0°F | Resp 16 | Wt 181.9 lb

## 2015-11-07 DIAGNOSIS — D472 Monoclonal gammopathy: Secondary | ICD-10-CM

## 2015-11-07 DIAGNOSIS — Z79899 Other long term (current) drug therapy: Secondary | ICD-10-CM | POA: Diagnosis not present

## 2015-11-07 DIAGNOSIS — I129 Hypertensive chronic kidney disease with stage 1 through stage 4 chronic kidney disease, or unspecified chronic kidney disease: Secondary | ICD-10-CM | POA: Diagnosis not present

## 2015-11-07 DIAGNOSIS — N189 Chronic kidney disease, unspecified: Secondary | ICD-10-CM | POA: Diagnosis not present

## 2015-11-07 NOTE — Progress Notes (Signed)
Patient does not offer any problems today.  

## 2015-11-08 DIAGNOSIS — E872 Acidosis: Secondary | ICD-10-CM | POA: Diagnosis not present

## 2015-11-08 DIAGNOSIS — E782 Mixed hyperlipidemia: Secondary | ICD-10-CM | POA: Diagnosis not present

## 2015-11-08 DIAGNOSIS — N183 Chronic kidney disease, stage 3 (moderate): Secondary | ICD-10-CM | POA: Diagnosis not present

## 2015-11-08 DIAGNOSIS — I10 Essential (primary) hypertension: Secondary | ICD-10-CM | POA: Diagnosis not present

## 2015-11-08 DIAGNOSIS — I129 Hypertensive chronic kidney disease with stage 1 through stage 4 chronic kidney disease, or unspecified chronic kidney disease: Secondary | ICD-10-CM | POA: Diagnosis not present

## 2015-11-13 NOTE — Progress Notes (Signed)
[name]  Telephone:(336) [phone] Fax:(336) [phone]  ID: PRANAV LINCE OB: May 30, 1931  MR#: [id number]  YSA#:[id number]  Patient Care Team: Adrian Prows, MD as PCP - General (Infectious Diseases)  CHIEF COMPLAINT:  Chief Complaint  Patient presents with  . MGUS    INTERVAL HISTORY: Patient returns to clinic today for repeat laboratory and further evaluation.  He continues to feel well and remains asymptomatic. He has no neurologic complaints.  He denies any recent fevers or illnesses.  He has a good appetite and denies weight loss.  He has no chest pain or shortness of breath.  He denies any nausea, vomiting, constipation, or diarrhea.  He has no urinary complaints.  Patient offers no specific complaints today.  REVIEW OF SYSTEMS:   Review of Systems  Constitutional: Negative.  Negative for fever, weight loss and malaise/fatigue.  Respiratory: Negative.  Negative for cough and shortness of breath.   Cardiovascular: Negative.  Negative for chest pain.  Gastrointestinal: Negative.   Genitourinary: Negative.   Musculoskeletal: Negative.   Neurological: Negative.  Negative for weakness.    As per HPI. Otherwise, a complete review of systems is negatve.  PAST MEDICAL HISTORY: Past Medical History  Diagnosis Date  . Hypertension   . Hypercholesteremia   . CKD (chronic kidney disease)   . MGUS (monoclonal gammopathy of unknown significance)   . GERD (gastroesophageal reflux disease)   . Arthritis   . Anemia   . Aortic aneurysm ([place])   . Aortic aneurysm ([place])     PAST SURGICAL HISTORY: Past Surgical History  Procedure Laterality Date  . Cardiac valve replacement      pig valve  . Coronary angioplasty    . Bipass    . Cholecystectomy    . Joint replacement      left knee x4  . Esophagogastroduodenoscopy (egd) with propofol N/A 02/07/2015    Procedure: ESOPHAGOGASTRODUODENOSCOPY (EGD) WITH PROPOFOL;  Surgeon: Hulen Luster, MD;  Location: [facility]  ENDOSCOPY;  Service: Gastroenterology;  Laterality: N/A;  . Coronary artery bypass graft    . Aortic valve replacement      FAMILY HISTORY: Reviewed and unchanged. No reported history of malignancy or chronic disease.     ADVANCED DIRECTIVES:    HEALTH MAINTENANCE: Social History  Substance Use Topics  . Smoking status: Former Research scientist (life sciences)  . Smokeless tobacco: Never Used  . Alcohol Use: No     Colonoscopy:  PAP:  Bone density:  Lipid panel:  Allergies  Allergen Reactions  . Morphine Anaphylaxis  . Iodinated Diagnostic Agents     Other reaction(s): Asthenia (finding), Other (qualifier value) paralyzed legs    Current Outpatient Prescriptions  Medication Sig Dispense Refill  . amLODipine (NORVASC) 5 MG tablet Take 5 mg by mouth daily.    . enalapril (VASOTEC) 10 MG tablet Take 10 mg by mouth 2 (two) times daily.    . Melatonin 3 MG TABS Take 1.5 mg by mouth at bedtime as needed (sleep).    . pravastatin (PRAVACHOL) 20 MG tablet Take 20 mg by mouth every evening.     No current facility-administered medications for this visit.    OBJECTIVE: Filed Vitals:   11/07/15 0959  BP: 173/77  Pulse: 66  Temp: 97 F (36.1 C)  Resp: 16     Body mass index is 24.66 kg/(m^2).    ECOG FS:0 - Asymptomatic  General: Well-developed, well-nourished, no acute distress. Eyes: Pink conjunctiva, anicteric sclera. Lungs: Clear to auscultation bilaterally. Heart: Regular  rate and rhythm. No rubs, murmurs, or gallops. Abdomen: Soft, nontender, nondistended. No organomegaly noted, normoactive bowel sounds. Musculoskeletal: No edema, cyanosis, or clubbing. Neuro: Alert, answering all questions appropriately. Cranial nerves grossly intact. Skin: No rashes or petechiae noted. Psych: Normal affect.   LAB RESULTS:  Lab Results  Component Value Date   NA 127* 10/31/2015   K 4.3 10/31/2015   CL 99* 10/31/2015   CO2 22 10/31/2015   GLUCOSE 105* 10/31/2015   BUN 25* 10/31/2015    CREATININE 1.60* 10/31/2015   CALCIUM 8.8* 10/31/2015   PROT 6.9 06/28/2015   ALBUMIN 4.0 06/28/2015   AST 22 06/28/2015   ALT 14* 06/28/2015   ALKPHOS 70 06/28/2015   BILITOT 0.8 06/28/2015   GFRNONAA 38* 10/31/2015   GFRAA 45* 10/31/2015    Lab Results  Component Value Date   WBC 5.4 10/31/2015   NEUTROABS 3.5 06/28/2015   HGB 13.5 10/31/2015   HCT 39.2* 10/31/2015   MCV 94.1 10/31/2015   PLT 193 10/31/2015   Lab Results  Component Value Date   TOTALPROTELP 6.5 10/31/2015   ALBUMINELP 3.8 10/31/2015   A1GS 0.3 10/31/2015   A2GS 0.7 10/31/2015   BETS 0.8 10/31/2015   GAMS 0.9 10/31/2015   MSPIKE Not Observed 10/31/2015   SPEI Comment 10/31/2015     STUDIES: No results found.  ASSESSMENT: MGUS  PLAN:    1.  MGUS: Bone marrow biopsy on May 25, 2015 did not reveal any diagnostic evidence of a plasma cell dyscrasia. Previously, patient had a negative metastatic bone survey. Other than a mild renal insufficiency, patient does not have any evidence of endorgan damage. Currently, patient does not have evidence of M spike in his serum or urine. No intervention is needed at this time. Return to clinic in 6 months with repeat laboratory work and further evaluation. If patient's M spike continues to be 0.0, he can be discharged from clinic.  2. Renal insufficiency: Treatment per nephrology. 3. Hypertension: Patient's blood pressure is elevated today, treatment per primary care.  Patient expressed understanding and was in agreement with this plan. He also understands that He can call clinic at any time with any questions, concerns, or complaints.     Lloyd Huger, MD   11/13/2015 7:07 AM

## 2015-11-22 ENCOUNTER — Ambulatory Visit (INDEPENDENT_AMBULATORY_CARE_PROVIDER_SITE_OTHER): Payer: PPO | Admitting: Urology

## 2015-11-22 ENCOUNTER — Encounter: Payer: Self-pay | Admitting: Urology

## 2015-11-22 VITALS — BP 157/70 | HR 85 | Ht 72.0 in | Wt 184.0 lb

## 2015-11-22 DIAGNOSIS — N189 Chronic kidney disease, unspecified: Secondary | ICD-10-CM | POA: Insufficient documentation

## 2015-11-22 DIAGNOSIS — Q6100 Congenital renal cyst, unspecified: Secondary | ICD-10-CM | POA: Diagnosis not present

## 2015-11-22 DIAGNOSIS — N281 Cyst of kidney, acquired: Secondary | ICD-10-CM

## 2015-11-22 DIAGNOSIS — I2510 Atherosclerotic heart disease of native coronary artery without angina pectoris: Secondary | ICD-10-CM | POA: Insufficient documentation

## 2015-11-22 LAB — URINALYSIS, COMPLETE
BILIRUBIN UA: NEGATIVE
GLUCOSE, UA: NEGATIVE
Ketones, UA: NEGATIVE
Leukocytes, UA: NEGATIVE
NITRITE UA: NEGATIVE
PROTEIN UA: NEGATIVE
RBC UA: NEGATIVE
Specific Gravity, UA: 1.015 (ref 1.005–1.030)
UUROB: 0.2 mg/dL (ref 0.2–1.0)
pH, UA: 7 (ref 5.0–7.5)

## 2015-11-22 LAB — MICROSCOPIC EXAMINATION
Bacteria, UA: NONE SEEN
WBC UA: NONE SEEN /HPF (ref 0–?)

## 2015-11-22 NOTE — Progress Notes (Signed)
11/22/2015 10:17 AM   [name] 1930/12/02 [id number]  Referring provider: Adrian Prows, MD [place], [place] [address]  Chief Complaint  Patient presents with  . Renal Cyst    New Patient    HPI: 82 year old white male presents today to establish urologic care more locally. He has a known left complex cyst which has been followed at [facility] urology over the past several years. The patient denies any flank pain. He denies any gross hematuria. He denies any recent weight loss or constitutional symptoms. He has no voiding symptoms.  CT A/P 05/16/11 hypodensities bilateral kidneys, consistent with cysts CT A/P 02/20/13 3.3 x 2.6 cm left interpolar renal mass, increased in size from 05/16/11 MRI abdomen 03/26/13 3.8 cm left renal complex cystic mass MRI Abdomen 10/02/13 4.1 cm lefty renal complex cystic mass, interpolar Renal US (10/05/14) stable left renal mass.       PMH: Past Medical History  Diagnosis Date  . Hypertension   . Hypercholesteremia   . CKD (chronic kidney disease)   . MGUS (monoclonal gammopathy of unknown significance)   . GERD (gastroesophageal reflux disease)   . Arthritis   . Anemia   . Aortic aneurysm ([place])   . Aortic aneurysm [facility])     Surgical History: Past Surgical History  Procedure Laterality Date  . Cardiac valve replacement      pig valve  . Coronary angioplasty    . Bipass    . Cholecystectomy    . Joint replacement      left knee x4  . Esophagogastroduodenoscopy (egd) with propofol N/A 02/07/2015    Procedure: ESOPHAGOGASTRODUODENOSCOPY (EGD) WITH PROPOFOL;  Surgeon: Hulen Luster, MD;  Location: [facility] ENDOSCOPY;  Service: Gastroenterology;  Laterality: N/A;  . Coronary artery bypass graft    . Aortic valve replacement      Home Medications:    Medication List       This list is accurate as of: 11/22/15 10:17 AM.  Always use your most recent med list.               amLODipine 5 MG tablet  Commonly known as:   NORVASC  Take 5 mg by mouth daily.     aspirin 81 MG tablet  Take 81 mg by mouth daily.     enalapril 10 MG tablet  Commonly known as:  VASOTEC  Take 10 mg by mouth 2 (two) times daily.     hydrALAZINE 25 MG tablet  Commonly known as:  APRESOLINE     Melatonin 3 MG Tabs  Take 1.5 mg by mouth at bedtime as needed (sleep).     pravastatin 20 MG tablet  Commonly known as:  PRAVACHOL  Take 20 mg by mouth every evening.        Allergies:  Allergies  Allergen Reactions  . Morphine Anaphylaxis  . Iodinated Diagnostic Agents     Other reaction(s): Asthenia (finding), Other (qualifier value) paralyzed legs    Family History: Family History  Problem Relation Age of Onset  . Bladder Cancer Neg Hx   . Prostate cancer Neg Hx   . Kidney cancer Neg Hx     Social History:  reports that he has quit smoking. He has never used smokeless tobacco. He reports that he does not drink alcohol or use illicit drugs.  ROS: UROLOGY Frequent Urination?: Yes Hard to postpone urination?: No Burning/pain with urination?: No Get up at night to urinate?: No Leakage of urine?: No  Urine stream starts and stops?: Yes Trouble starting stream?: No Do you have to strain to urinate?: No Blood in urine?: No Urinary tract infection?: No Sexually transmitted disease?: No Injury to kidneys or bladder?: No Painful intercourse?: No Weak stream?: No Erection problems?: Yes Penile pain?: No  Gastrointestinal Nausea?: No Vomiting?: No Indigestion/heartburn?: No Diarrhea?: No Constipation?: Yes  Constitutional Fever: No Night sweats?: No Weight loss?: No Fatigue?: No  Skin Skin rash/lesions?: No Itching?: No  Eyes Blurred vision?: No Double vision?: No  Ears/Nose/Throat Sore throat?: No Sinus problems?: No  Hematologic/Lymphatic Swollen glands?: No Easy bruising?: Yes  Cardiovascular Leg swelling?: No Chest pain?: No  Respiratory Cough?: No Shortness of breath?:  No  Endocrine Excessive thirst?: No  Musculoskeletal Back pain?: No Joint pain?: Yes  Neurological Headaches?: No Dizziness?: No  Psychologic Depression?: No Anxiety?: No  Physical Exam: BP 157/70 mmHg  Pulse 85  Ht 6' (1.829 m)  Wt 184 lb (83.462 kg)  BMI 24.95 kg/m2  Constitutional:  Alert and oriented, No acute distress. HEENT: [place] AT, moist mucus membranes.  Trachea midline, no masses. Cardiovascular: No clubbing, cyanosis, or edema. Respiratory: Normal respiratory effort, no increased work of breathing. GI: Abdomen is soft, nontender, nondistended, no abdominal masses Skin: No rashes, bruises or suspicious lesions. Lymph: No cervical or inguinal adenopathy. Neurologic: Grossly intact, no focal deficits, moving all 4 extremities. Psychiatric: Normal mood and affect.  Laboratory Data: Lab Results  Component Value Date   WBC 5.4 10/31/2015   HGB 13.5 10/31/2015   HCT 39.2* 10/31/2015   MCV 94.1 10/31/2015   PLT 193 10/31/2015    Lab Results  Component Value Date   CREATININE 1.60* 10/31/2015    No results found for: PSA  No results found for: TESTOSTERONE  Lab Results  Component Value Date   HGBA1C  02/23/2010    5.4 (NOTE)                                                                       According to the ADA Clinical Practice Recommendations for 2011, when HbA1c is used as a screening test:   >=6.5%   Diagnostic of Diabetes Mellitus           (if abnormal result  is confirmed)  5.7-6.4%   Increased risk of developing Diabetes Mellitus  References:Diagnosis and Classification of Diabetes Mellitus,Diabetes [id number] 1):S62-S69 and Standards of Medical Care in         Diabetes - 2011,Diabetes Care,2011,34  (Suppl 1):S11-S61.    Urinalysis    Component Value Date/Time   COLORURINE STRAW* 06/28/2015 0913   APPEARANCEUR CLEAR* 06/28/2015 0913   LABSPEC 1.005 06/28/2015 0913   PHURINE 7.0 06/28/2015 0913   GLUCOSEU NEGATIVE 06/28/2015 0913    HGBUR NEGATIVE 06/28/2015 0913   BILIRUBINUR NEGATIVE 06/28/2015 0913   KETONESUR NEGATIVE 06/28/2015 0913   PROTEINUR NEGATIVE 06/28/2015 0913   UROBILINOGEN 1.0 02/16/2010 1336   NITRITE NEGATIVE 06/28/2015 0913   LEUKOCYTESUR NEGATIVE 06/28/2015 0913    Pertinent Imaging: The patient has no current imaging of his left renal cyst. There are some documents that I was able to review through the care everywhere which demonstrated a slow growing complex left renal cyst.  Assessment & Plan:  The  patient has a known complex left renal cyst, last seen by a urologist in January 2016. At that point, it was recommended that he follow up in 1 year with a renal ultrasound given the stable nature of the cyst.  1. Renal cyst Our plan is to obtain an MRI as a baseline of his cyst. I will follow-up with him after that to establish a surveillance regimen. Likely, the patient can follow up with ultrasounds at that point. - Urinalysis, Complete - MR Abdomen W Contrast; Future   Return in about 1 month (around 12/23/2015).  Ardis Hughs, [facility] [address], [place],  [address] [phone]

## 2015-11-24 ENCOUNTER — Other Ambulatory Visit: Payer: Self-pay

## 2015-11-24 DIAGNOSIS — N281 Cyst of kidney, acquired: Secondary | ICD-10-CM

## 2015-11-24 NOTE — Addendum Note (Signed)
Addended by: Ardis Hughs on: 11/24/2015 05:02 AM   Modules accepted: Orders

## 2015-11-28 ENCOUNTER — Ambulatory Visit
Admission: RE | Admit: 2015-11-28 | Discharge: 2015-11-28 | Disposition: A | Discharge status: Home / Self Care | Payer: PPO | Source: Ambulatory Visit | Attending: Urology | Admitting: Urology

## 2015-11-28 DIAGNOSIS — K7689 Other specified diseases of liver: Secondary | ICD-10-CM | POA: Insufficient documentation

## 2015-11-28 DIAGNOSIS — N281 Cyst of kidney, acquired: Secondary | ICD-10-CM | POA: Insufficient documentation

## 2015-11-28 DIAGNOSIS — N2889 Other specified disorders of kidney and ureter: Secondary | ICD-10-CM | POA: Diagnosis not present

## 2015-11-28 MED ORDER — GADOBENATE DIMEGLUMINE 529 MG/ML IV SOLN
10.0000 mL | Freq: Once | INTRAVENOUS | Status: AC | PRN
  Administered 2015-11-28: 8 mL via INTRAVENOUS

## 2015-12-06 ENCOUNTER — Ambulatory Visit (INDEPENDENT_AMBULATORY_CARE_PROVIDER_SITE_OTHER): Payer: PPO | Admitting: Urology

## 2015-12-06 ENCOUNTER — Encounter: Payer: Self-pay | Admitting: Urology

## 2015-12-06 VITALS — BP 157/69 | HR 62 | Ht 72.0 in | Wt 185.9 lb

## 2015-12-06 DIAGNOSIS — N281 Cyst of kidney, acquired: Secondary | ICD-10-CM

## 2015-12-06 NOTE — Progress Notes (Signed)
8:[place] 1931/04/15 [id number]  Referring provider: Adrian Prows, MD [place], [place] [address]  Chief Complaint  Patient presents with  . Follow-up    diagnostic study     HPI: 82 year old white male presents today to establish urologic care more locally. He has a known left complex cyst which has been followed at [facility] urology over the past several years. The patient denies any flank pain. He denies any gross hematuria. He denies any recent weight loss or constitutional symptoms. He has no voiding symptoms.  CT A/P 05/16/11 hypodensities bilateral kidneys, consistent with cysts CT A/P 02/20/13 3.3 x 2.6 cm left interpolar renal mass, increased in size from 05/16/11 MRI abdomen 03/26/13 3.8 cm left renal complex cystic mass MRI Abdomen 10/02/13 4.1 cm lefty renal complex cystic mass, interpolar Renal US (10/05/14) stable left renal mass.  MRI 11/2015: complex cystic renal, intrapolar, 4.7cm  Intv: Patient presents today for follow-up of his left renal mass.   PMH: Past Medical History  Diagnosis Date  . Hypertension   . Hypercholesteremia   . CKD (chronic kidney disease)   . MGUS (monoclonal gammopathy of unknown significance)   . GERD (gastroesophageal reflux disease)   . Arthritis   . Anemia   . Aortic aneurysm ([place])   . Aortic aneurysm [facility])     Surgical History: Past Surgical History  Procedure Laterality Date  . Cardiac valve replacement      pig valve  . Coronary angioplasty    . Bipass    . Cholecystectomy    . Joint replacement      left knee x4  . Esophagogastroduodenoscopy (egd) with propofol N/A 02/07/2015    Procedure: ESOPHAGOGASTRODUODENOSCOPY (EGD) WITH PROPOFOL;  Surgeon: Hulen Luster, MD;  Location: [facility] ENDOSCOPY;  Service: Gastroenterology;  Laterality: N/A;  . Coronary artery bypass graft    . Aortic valve replacement      Home Medications:    Medication List       This list is accurate as of: 12/06/15   8:57 AM.  Always use your most recent med list.               amLODipine 5 MG tablet  Commonly known as:  NORVASC  Take 5 mg by mouth daily.     aspirin 81 MG tablet  Take 81 mg by mouth daily.     enalapril 10 MG tablet  Commonly known as:  VASOTEC  Take 10 mg by mouth 2 (two) times daily.     enalapril 20 MG tablet  Commonly known as:  VASOTEC  Reported on 12/06/2015     hydrALAZINE 25 MG tablet  Commonly known as:  APRESOLINE     Melatonin 3 MG Tabs  Take 1.5 mg by mouth at bedtime as needed (sleep).     pravastatin 20 MG tablet  Commonly known as:  PRAVACHOL  Take 20 mg by mouth every evening.        Allergies:  Allergies  Allergen Reactions  . Morphine Anaphylaxis  . Iodinated Diagnostic Agents     Other reaction(s): Asthenia (finding), Other (qualifier value) paralyzed legs    Family History: Family History  Problem Relation Age of Onset  . Bladder Cancer Neg Hx   . Prostate cancer Neg Hx   . Kidney cancer Neg Hx     Social History:  reports that he has quit smoking. He has never used smokeless tobacco. He reports that  he does not drink alcohol or use illicit drugs.  ROS:                                        Physical Exam: BP 157/69 mmHg  Pulse 62  Ht 6' (1.829 m)  Wt 84.324 kg (185 lb 14.4 oz)  BMI 25.21 kg/m2  Constitutional:  Alert and oriented, No acute distress. HEENT:  AT, moist mucus membranes.  Trachea midline, no masses. Cardiovascular: No clubbing, cyanosis, or edema. Respiratory: Normal respiratory effort, no increased work of breathing. GI: Abdomen is soft, nontender, nondistended, no abdominal masses Skin: No rashes, bruises or suspicious lesions. Lymph: No cervical or inguinal adenopathy. Neurologic: Grossly intact, no focal deficits, moving all 4 extremities. Psychiatric: Normal mood and affect.  Laboratory Data: Lab Results  Component Value Date   WBC 5.4 10/31/2015   HGB 13.5 10/31/2015    HCT 39.2* 10/31/2015   MCV 94.1 10/31/2015   PLT 193 10/31/2015    Lab Results  Component Value Date   CREATININE 1.60* 10/31/2015    No results found for: PSA  No results found for: TESTOSTERONE  Lab Results  Component Value Date   HGBA1C  02/23/2010    5.4 (NOTE)                                                                       According to the ADA Clinical Practice Recommendations for 2011, when HbA1c is used as a screening test:   >=6.5%   Diagnostic of Diabetes Mellitus           (if abnormal result  is confirmed)  5.7-6.4%   Increased risk of developing Diabetes Mellitus  References:Diagnosis and Classification of Diabetes Mellitus,Diabetes [id number] 1):S62-S69 and Standards of Medical Care in         Diabetes - 2011,Diabetes Care,2011,34  (Suppl 1):S11-S61.    Urinalysis    Component Value Date/Time   COLORURINE STRAW* 06/28/2015 0913   APPEARANCEUR Clear 11/22/2015 0950   APPEARANCEUR CLEAR* 06/28/2015 0913   LABSPEC 1.005 06/28/2015 0913   PHURINE 7.0 06/28/2015 0913   GLUCOSEU Negative 11/22/2015 0950   HGBUR NEGATIVE 06/28/2015 0913   BILIRUBINUR Negative 11/22/2015 0950   BILIRUBINUR NEGATIVE 06/28/2015 0913   KETONESUR NEGATIVE 06/28/2015 0913   PROTEINUR Negative 11/22/2015 0950   PROTEINUR NEGATIVE 06/28/2015 0913   UROBILINOGEN 1.0 02/16/2010 1336   NITRITE Negative 11/22/2015 0950   NITRITE NEGATIVE 06/28/2015 0913   LEUKOCYTESUR Negative 11/22/2015 0950   LEUKOCYTESUR NEGATIVE 06/28/2015 0913    Pertinent Imaging: I then apparently reviewed the patient's MRI which demonstrates a 4.7 centimeter complex cystic renal mass in the interpolar region, does not appear to have enhancing septations. The study is somewhat limited by motion artifact.  Assessment & Plan:  The patient has a Bosniak 2 F renal mass, all the complexity of the mass is unchanged it continues to grow. It is now 4.7 centimeters. 1. Renal cyst I reviewed the images with the  patient in detail. I discussed with him options including surgical extirpation versus ongoing surveillance. The patient is not year to have any procedures, and I agree with his  approach of conservative management. However, the mass is growing in size and at some point the mass may get to the point where partial nephrectomy is no longer recommended and the patient would subsequently need radical nephrectomy if we ever decided to remove the lesion. At the point where the management options change between partial nephrectomy and radical nephrectomy we would strongly consider removing it. Conversely, if the lesion remains unchanged over the next several years and he no longer becomes a surgical candidate, we may stop following this altogether and let nature take its course. The patient and I discussed all these options and he is well aware of the circumstances. Our plan is to have the patient follow-up in 1 year with a renal ultrasound. We can continue our discussion at that point.  No Follow-up on file.  Ardis Hughs, [facility] [address], [place], [place] [address] [phone]

## 2015-12-08 ENCOUNTER — Other Ambulatory Visit: Payer: PPO

## 2015-12-15 ENCOUNTER — Ambulatory Visit: Payer: Self-pay | Admitting: Oncology

## 2015-12-29 DIAGNOSIS — M1711 Unilateral primary osteoarthritis, right knee: Secondary | ICD-10-CM | POA: Diagnosis not present

## 2015-12-29 DIAGNOSIS — M1710 Unilateral primary osteoarthritis, unspecified knee: Secondary | ICD-10-CM | POA: Diagnosis not present

## 2015-12-29 DIAGNOSIS — Z96652 Presence of left artificial knee joint: Secondary | ICD-10-CM | POA: Diagnosis not present

## 2016-01-04 DIAGNOSIS — B351 Tinea unguium: Secondary | ICD-10-CM | POA: Diagnosis not present

## 2016-01-04 DIAGNOSIS — D2372 Other benign neoplasm of skin of left lower limb, including hip: Secondary | ICD-10-CM | POA: Diagnosis not present

## 2016-01-04 DIAGNOSIS — D2371 Other benign neoplasm of skin of right lower limb, including hip: Secondary | ICD-10-CM | POA: Diagnosis not present

## 2016-01-31 DIAGNOSIS — I10 Essential (primary) hypertension: Secondary | ICD-10-CM | POA: Diagnosis not present

## 2016-01-31 DIAGNOSIS — E782 Mixed hyperlipidemia: Secondary | ICD-10-CM | POA: Diagnosis not present

## 2016-01-31 DIAGNOSIS — I350 Nonrheumatic aortic (valve) stenosis: Secondary | ICD-10-CM | POA: Diagnosis not present

## 2016-01-31 DIAGNOSIS — I25118 Atherosclerotic heart disease of native coronary artery with other forms of angina pectoris: Secondary | ICD-10-CM | POA: Diagnosis not present

## 2016-01-31 DIAGNOSIS — I255 Ischemic cardiomyopathy: Secondary | ICD-10-CM | POA: Diagnosis not present

## 2016-02-06 DIAGNOSIS — R140 Abdominal distension (gaseous): Secondary | ICD-10-CM | POA: Diagnosis not present

## 2016-02-10 DIAGNOSIS — N183 Chronic kidney disease, stage 3 (moderate): Secondary | ICD-10-CM | POA: Diagnosis not present

## 2016-02-10 DIAGNOSIS — R6881 Early satiety: Secondary | ICD-10-CM | POA: Diagnosis not present

## 2016-02-10 DIAGNOSIS — I255 Ischemic cardiomyopathy: Secondary | ICD-10-CM | POA: Diagnosis not present

## 2016-02-10 DIAGNOSIS — I10 Essential (primary) hypertension: Secondary | ICD-10-CM | POA: Diagnosis not present

## 2016-02-17 DIAGNOSIS — E875 Hyperkalemia: Secondary | ICD-10-CM | POA: Diagnosis not present

## 2016-02-17 DIAGNOSIS — I10 Essential (primary) hypertension: Secondary | ICD-10-CM | POA: Diagnosis not present

## 2016-02-17 DIAGNOSIS — E871 Hypo-osmolality and hyponatremia: Secondary | ICD-10-CM | POA: Diagnosis not present

## 2016-02-17 DIAGNOSIS — I25118 Atherosclerotic heart disease of native coronary artery with other forms of angina pectoris: Secondary | ICD-10-CM | POA: Diagnosis not present

## 2016-02-17 DIAGNOSIS — N183 Chronic kidney disease, stage 3 (moderate): Secondary | ICD-10-CM | POA: Diagnosis not present

## 2016-03-05 DIAGNOSIS — R140 Abdominal distension (gaseous): Secondary | ICD-10-CM | POA: Diagnosis not present

## 2016-03-08 DIAGNOSIS — I129 Hypertensive chronic kidney disease with stage 1 through stage 4 chronic kidney disease, or unspecified chronic kidney disease: Secondary | ICD-10-CM | POA: Diagnosis not present

## 2016-03-08 DIAGNOSIS — E872 Acidosis: Secondary | ICD-10-CM | POA: Diagnosis not present

## 2016-03-08 DIAGNOSIS — N183 Chronic kidney disease, stage 3 (moderate): Secondary | ICD-10-CM | POA: Diagnosis not present

## 2016-03-08 DIAGNOSIS — R809 Proteinuria, unspecified: Secondary | ICD-10-CM | POA: Diagnosis not present

## 2016-03-21 ENCOUNTER — Encounter
Admission: RE | Admit: 2016-03-21 | Discharge: 2016-03-21 | Disposition: A | Discharge status: Home / Self Care | Payer: PPO | Source: Ambulatory Visit | Attending: Orthopedic Surgery | Admitting: Orthopedic Surgery

## 2016-03-21 DIAGNOSIS — Z01812 Encounter for preprocedural laboratory examination: Secondary | ICD-10-CM | POA: Diagnosis not present

## 2016-03-21 HISTORY — DX: Diverticulosis of intestine, part unspecified, without perforation or abscess without bleeding: K57.90

## 2016-03-21 HISTORY — DX: Atherosclerotic heart disease of native coronary artery without angina pectoris: I25.10

## 2016-03-21 HISTORY — DX: Personal history of other diseases of the digestive system: Z87.19

## 2016-03-21 LAB — CBC
HEMATOCRIT: 36.2 % — AB (ref 40.0–52.0)
Hemoglobin: 12.7 g/dL — ABNORMAL LOW (ref 13.0–18.0)
MCH: 33.4 pg (ref 26.0–34.0)
MCHC: 35 g/dL (ref 32.0–36.0)
MCV: 95.6 fL (ref 80.0–100.0)
PLATELETS: 180 10*3/uL (ref 150–440)
RBC: 3.79 MIL/uL — ABNORMAL LOW (ref 4.40–5.90)
RDW: 13.7 % (ref 11.5–14.5)
WBC: 7.3 10*3/uL (ref 3.8–10.6)

## 2016-03-21 LAB — COMPREHENSIVE METABOLIC PANEL
ALT: 13 U/L — ABNORMAL LOW (ref 17–63)
ANION GAP: 7 (ref 5–15)
AST: 25 U/L (ref 15–41)
Albumin: 4 g/dL (ref 3.5–5.0)
Alkaline Phosphatase: 71 U/L (ref 38–126)
BILIRUBIN TOTAL: 0.6 mg/dL (ref 0.3–1.2)
BUN: 24 mg/dL — ABNORMAL HIGH (ref 6–20)
CHLORIDE: 98 mmol/L — AB (ref 101–111)
CO2: 21 mmol/L — ABNORMAL LOW (ref 22–32)
Calcium: 8.9 mg/dL (ref 8.9–10.3)
Creatinine, Ser: 1.54 mg/dL — ABNORMAL HIGH (ref 0.61–1.24)
GFR, EST AFRICAN AMERICAN: 46 mL/min — AB (ref >60)
GFR, EST NON AFRICAN AMERICAN: 40 mL/min — AB (ref >60)
Glucose, Bld: 132 mg/dL — ABNORMAL HIGH (ref 65–99)
POTASSIUM: 4.3 mmol/L (ref 3.5–5.1)
Sodium: 126 mmol/L — ABNORMAL LOW (ref 135–145)
TOTAL PROTEIN: 6.7 g/dL (ref 6.5–8.1)

## 2016-03-21 LAB — URINALYSIS COMPLETE WITH MICROSCOPIC (ARMC ONLY)
BACTERIA UA: NONE SEEN
BILIRUBIN URINE: NEGATIVE
GLUCOSE, UA: NEGATIVE mg/dL
HGB URINE DIPSTICK: NEGATIVE
Ketones, ur: NEGATIVE mg/dL
LEUKOCYTES UA: NEGATIVE
Nitrite: NEGATIVE
PH: 7 (ref 5.0–8.0)
Protein, ur: NEGATIVE mg/dL
RBC / HPF: NONE SEEN RBC/hpf (ref 0–5)
Specific Gravity, Urine: 1.005 (ref 1.005–1.030)
WBC UA: NONE SEEN WBC/hpf (ref 0–5)

## 2016-03-21 LAB — SEDIMENTATION RATE: SED RATE: 10 mm/h (ref 0–20)

## 2016-03-21 LAB — SURGICAL PCR SCREEN
MRSA, PCR: NEGATIVE
STAPHYLOCOCCUS AUREUS: NEGATIVE

## 2016-03-21 LAB — PROTIME-INR
INR: 1.08
PROTHROMBIN TIME: 14.2 s (ref 11.4–15.0)

## 2016-03-21 LAB — APTT: aPTT: 31 seconds (ref 24–36)

## 2016-03-21 NOTE — Patient Instructions (Addendum)
  Your procedure is scheduled on: 04/02/16 Report to Day Surgery. [facility] SECOND FLOOR To find out your arrival time please call [phone] between 1PM - 3PM on 03/30/16.  Remember: Instructions that are not followed completely may result in serious medical risk, up to and including death, or upon the discretion of your surgeon and anesthesiologist your surgery may need to be rescheduled.    __X__ 1. Do not eat food or drink liquids after midnight. No gum chewing or hard candies.     _X___ 2. No Alcohol for 24 hours before or after surgery.   _X___ 3. Do Not Smoke For 24 Hours Prior to Your Surgery.   ____ 4. Bring all medications with you on the day of surgery if instructed.    _X___ 5. Notify your doctor if there is any change in your medical condition     (cold, fever, infections).       Do not wear jewelry, make-up, hairpins, clips or nail polish.  Do not wear lotions, powders, or perfumes. You may wear deodorant.  Do not shave 48 hours prior to surgery. Men may shave face and neck.  Do not bring valuables to the hospital.    [facility] is not responsible for any belongings or valuables.               Contacts, dentures or bridgework may not be worn into surgery.  Leave your suitcase in the car. After surgery it may be brought to your room.  For patients admitted to the hospital, discharge time is determined by your                treatment team.   Patients discharged the day of surgery will not be allowed to drive home.   Please read over the following fact sheets that you were given:   Surgical Site Infection Prevention/MRSA   __X__ Take these medicines the morning of surgery with A SIP OF WATER:    1.AMLODIPINE  2. ENALAPRIL  3. HYDRALAZINE  4. [place] AT BEDTIME 04/01/16 AND AM OF SURGERY  5.   6.  ____ Fleet Enema (as directed)   _X_ Use CHG Soap as directed  ____ Use inhalers on the day of surgery  ____ Stop metformin 2 days prior to surgery    ____  Take 1/2 of usual insulin dose the night before surgery and none on the morning of surgery.   _X___ Stop Coumadin/Plavix/aspirin on  STOP ASPIRIN AS INSTRUCTED BY DR [place] ____ Stop Anti-inflammatories on   _X___ Stop supplements until after surgery.  STOP MELATONIN 1 WEEK BEFORE SURGERY  ____ Bring C-Pap to the hospital.   CALL [place] NAME AND  DOSAGE OF PROBIOTIC

## 2016-03-21 NOTE — Pre-Procedure Instructions (Signed)
DR Nehemiah Massed OK'ED STOPPING ASPIRIN 7 DAYS PRE OP AND PATIENT INSTRUCTED

## 2016-03-22 LAB — TYPE AND SCREEN
ABO/RH(D): A POS
ANTIBODY SCREEN: NEGATIVE

## 2016-03-22 LAB — URINE CULTURE: Special Requests: NORMAL

## 2016-03-22 NOTE — Pre-Procedure Instructions (Signed)
Reviewed Met C compared to prior results, pt has a history of hyponatremia, elevated creatinine and lower GFR.  Will recheck sodium am of surgery per S. Fields Therapist, sports.

## 2016-03-29 DIAGNOSIS — R208 Other disturbances of skin sensation: Secondary | ICD-10-CM | POA: Diagnosis not present

## 2016-03-29 DIAGNOSIS — Z79899 Other long term (current) drug therapy: Secondary | ICD-10-CM | POA: Diagnosis not present

## 2016-03-29 DIAGNOSIS — B356 Tinea cruris: Secondary | ICD-10-CM | POA: Diagnosis not present

## 2016-03-29 DIAGNOSIS — B372 Candidiasis of skin and nail: Secondary | ICD-10-CM | POA: Diagnosis not present

## 2016-03-29 DIAGNOSIS — L304 Erythema intertrigo: Secondary | ICD-10-CM | POA: Diagnosis not present

## 2016-04-02 ENCOUNTER — Encounter
Admission: RE | Disposition: A | Discharge status: Home Health | Payer: Self-pay | Source: Ambulatory Visit | Attending: Orthopedic Surgery

## 2016-04-02 ENCOUNTER — Inpatient Hospital Stay: Payer: PPO | Admitting: Anesthesiology

## 2016-04-02 ENCOUNTER — Inpatient Hospital Stay
Admission: RE | Admit: 2016-04-02 | Discharge: 2016-04-05 | DRG: 470 | Disposition: A | Discharge status: Home Health | Payer: PPO | Source: Ambulatory Visit | Attending: Orthopedic Surgery | Admitting: Orthopedic Surgery

## 2016-04-02 ENCOUNTER — Inpatient Hospital Stay: Payer: PPO

## 2016-04-02 DIAGNOSIS — I2510 Atherosclerotic heart disease of native coronary artery without angina pectoris: Secondary | ICD-10-CM | POA: Diagnosis not present

## 2016-04-02 DIAGNOSIS — Z96659 Presence of unspecified artificial knee joint: Secondary | ICD-10-CM

## 2016-04-02 DIAGNOSIS — N183 Chronic kidney disease, stage 3 (moderate): Secondary | ICD-10-CM | POA: Diagnosis present

## 2016-04-02 DIAGNOSIS — Z7982 Long term (current) use of aspirin: Secondary | ICD-10-CM | POA: Diagnosis not present

## 2016-04-02 DIAGNOSIS — Z79899 Other long term (current) drug therapy: Secondary | ICD-10-CM | POA: Diagnosis not present

## 2016-04-02 DIAGNOSIS — Y92230 Patient room in hospital as the place of occurrence of the external cause: Secondary | ICD-10-CM | POA: Diagnosis not present

## 2016-04-02 DIAGNOSIS — Z87891 Personal history of nicotine dependence: Secondary | ICD-10-CM | POA: Diagnosis not present

## 2016-04-02 DIAGNOSIS — Z96641 Presence of right artificial hip joint: Secondary | ICD-10-CM | POA: Diagnosis not present

## 2016-04-02 DIAGNOSIS — Z96652 Presence of left artificial knee joint: Secondary | ICD-10-CM | POA: Diagnosis present

## 2016-04-02 DIAGNOSIS — K219 Gastro-esophageal reflux disease without esophagitis: Secondary | ICD-10-CM | POA: Diagnosis present

## 2016-04-02 DIAGNOSIS — Z471 Aftercare following joint replacement surgery: Secondary | ICD-10-CM | POA: Diagnosis not present

## 2016-04-02 DIAGNOSIS — I714 Abdominal aortic aneurysm, without rupture: Secondary | ICD-10-CM | POA: Diagnosis present

## 2016-04-02 DIAGNOSIS — E785 Hyperlipidemia, unspecified: Secondary | ICD-10-CM | POA: Diagnosis not present

## 2016-04-02 DIAGNOSIS — R001 Bradycardia, unspecified: Secondary | ICD-10-CM | POA: Diagnosis present

## 2016-04-02 DIAGNOSIS — M1711 Unilateral primary osteoarthritis, right knee: Principal | ICD-10-CM | POA: Diagnosis present

## 2016-04-02 DIAGNOSIS — I129 Hypertensive chronic kidney disease with stage 1 through stage 4 chronic kidney disease, or unspecified chronic kidney disease: Secondary | ICD-10-CM | POA: Diagnosis present

## 2016-04-02 DIAGNOSIS — E871 Hypo-osmolality and hyponatremia: Secondary | ICD-10-CM | POA: Diagnosis not present

## 2016-04-02 DIAGNOSIS — Z951 Presence of aortocoronary bypass graft: Secondary | ICD-10-CM | POA: Diagnosis not present

## 2016-04-02 DIAGNOSIS — E7800 Pure hypercholesterolemia, unspecified: Secondary | ICD-10-CM | POA: Diagnosis not present

## 2016-04-02 DIAGNOSIS — I10 Essential (primary) hypertension: Secondary | ICD-10-CM | POA: Diagnosis not present

## 2016-04-02 DIAGNOSIS — Z9861 Coronary angioplasty status: Secondary | ICD-10-CM

## 2016-04-02 DIAGNOSIS — Z952 Presence of prosthetic heart valve: Secondary | ICD-10-CM

## 2016-04-02 DIAGNOSIS — T402X5A Adverse effect of other opioids, initial encounter: Secondary | ICD-10-CM | POA: Diagnosis not present

## 2016-04-02 HISTORY — PX: KNEE ARTHROPLASTY: SHX992

## 2016-04-02 LAB — POCT I-STAT 4, (NA,K, GLUC, HGB,HCT)
GLUCOSE: 109 mg/dL — AB (ref 65–99)
HCT: 43 % (ref 39.0–52.0)
Hemoglobin: 14.6 g/dL (ref 13.0–17.0)
Potassium: 4 mmol/L (ref 3.5–5.1)
Sodium: 128 mmol/L — ABNORMAL LOW (ref 135–145)

## 2016-04-02 LAB — GLUCOSE, CAPILLARY: GLUCOSE-CAPILLARY: 112 mg/dL — AB (ref 65–99)

## 2016-04-02 SURGERY — ARTHROPLASTY, KNEE, TOTAL, USING IMAGELESS COMPUTER-ASSISTED NAVIGATION
Anesthesia: Spinal | Site: Knee | Laterality: Right | Wound class: Clean

## 2016-04-02 MED ORDER — FENTANYL CITRATE (PF) 100 MCG/2ML IJ SOLN: 25.0000 ug | INTRAMUSCULAR | Status: DC | PRN

## 2016-04-02 MED ORDER — PRAVASTATIN SODIUM 20 MG PO TABS
20.0000 mg | ORAL_TABLET | Freq: Every evening | ORAL | Status: DC
  Administered 2016-04-02 – 2016-04-04 (×4): 20 mg via ORAL
  Filled 2016-04-02 (×4): qty 1

## 2016-04-02 MED ORDER — ENALAPRIL MALEATE 10 MG PO TABS
20.0000 mg | ORAL_TABLET | Freq: Two times a day (BID) | ORAL | Status: DC
  Administered 2016-04-02 – 2016-04-05 (×6): 20 mg via ORAL
  Filled 2016-04-02 (×6): qty 2

## 2016-04-02 MED ORDER — ALUM & MAG HYDROXIDE-SIMETH 200-200-20 MG/5ML PO SUSP: 30.0000 mL | ORAL | Status: DC | PRN

## 2016-04-02 MED ORDER — BUPIVACAINE HCL (PF) 0.25 % IJ SOLN
INTRAMUSCULAR | Status: AC
  Filled 2016-04-02: qty 60

## 2016-04-02 MED ORDER — BUPIVACAINE LIPOSOME 1.3 % IJ SUSP
INTRAMUSCULAR | Status: AC
  Filled 2016-04-02: qty 20

## 2016-04-02 MED ORDER — METOCLOPRAMIDE HCL 10 MG PO TABS
10.0000 mg | ORAL_TABLET | Freq: Three times a day (TID) | ORAL | Status: AC
  Administered 2016-04-02 – 2016-04-04 (×7): 10 mg via ORAL
  Filled 2016-04-02 (×8): qty 1

## 2016-04-02 MED ORDER — GLYCOPYRROLATE 0.2 MG/ML IJ SOLN
INTRAMUSCULAR | Status: DC | PRN
  Administered 2016-04-02: 2 mg via INTRAVENOUS

## 2016-04-02 MED ORDER — BISACODYL 10 MG RE SUPP
10.0000 mg | Freq: Every day | RECTAL | Status: DC | PRN
  Administered 2016-04-05: 10 mg via RECTAL
  Filled 2016-04-02 (×2): qty 1

## 2016-04-02 MED ORDER — CEFAZOLIN SODIUM-DEXTROSE 2-4 GM/100ML-% IV SOLN
2.0000 g | INTRAVENOUS | Status: AC
  Administered 2016-04-02: 2 g via INTRAVENOUS

## 2016-04-02 MED ORDER — ACETAMINOPHEN 10 MG/ML IV SOLN
INTRAVENOUS | Status: DC | PRN
  Administered 2016-04-02: 1000 mg via INTRAVENOUS

## 2016-04-02 MED ORDER — HYDRALAZINE HCL 25 MG PO TABS
25.0000 mg | ORAL_TABLET | Freq: Two times a day (BID) | ORAL | Status: DC
  Administered 2016-04-02 – 2016-04-05 (×5): 25 mg via ORAL
  Filled 2016-04-02 (×6): qty 1

## 2016-04-02 MED ORDER — CEFAZOLIN SODIUM-DEXTROSE 2-4 GM/100ML-% IV SOLN
2.0000 g | Freq: Four times a day (QID) | INTRAVENOUS | Status: AC
  Administered 2016-04-02 – 2016-04-03 (×4): 2 g via INTRAVENOUS
  Filled 2016-04-02 (×4): qty 100

## 2016-04-02 MED ORDER — PHENOL 1.4 % MT LIQD
1.0000 | OROMUCOSAL | Status: DC | PRN
  Filled 2016-04-02: qty 177

## 2016-04-02 MED ORDER — ACETAMINOPHEN 650 MG RE SUPP: 650.0000 mg | Freq: Four times a day (QID) | RECTAL | Status: DC | PRN

## 2016-04-02 MED ORDER — SODIUM CHLORIDE FLUSH 0.9 % IV SOLN
INTRAVENOUS | Status: AC
  Administered 2016-04-02: 11:00:00
  Filled 2016-04-02: qty 10

## 2016-04-02 MED ORDER — FLEET ENEMA 7-19 GM/118ML RE ENEM: 1.0000 | ENEMA | Freq: Once | RECTAL | Status: DC | PRN

## 2016-04-02 MED ORDER — LACTATED RINGERS IV SOLN
INTRAVENOUS | Status: DC
  Administered 2016-04-02 (×2): via INTRAVENOUS

## 2016-04-02 MED ORDER — ONDANSETRON HCL 4 MG/2ML IJ SOLN: 4.0000 mg | Freq: Once | INTRAMUSCULAR | Status: DC | PRN

## 2016-04-02 MED ORDER — LIDOCAINE HCL (CARDIAC) 20 MG/ML IV SOLN
INTRAVENOUS | Status: DC | PRN
  Administered 2016-04-02: 100 mg via INTRAVENOUS

## 2016-04-02 MED ORDER — NEOMYCIN-POLYMYXIN B GU 40-200000 IR SOLN
Status: DC | PRN
  Administered 2016-04-02: 14 mL

## 2016-04-02 MED ORDER — KETAMINE HCL 50 MG/ML IJ SOLN
INTRAMUSCULAR | Status: DC | PRN
  Administered 2016-04-02: 25 mg via INTRAMUSCULAR

## 2016-04-02 MED ORDER — ONDANSETRON HCL 4 MG/2ML IJ SOLN
INTRAMUSCULAR | Status: DC | PRN
  Administered 2016-04-02: 4 mg via INTRAVENOUS

## 2016-04-02 MED ORDER — PANTOPRAZOLE SODIUM 40 MG PO TBEC
40.0000 mg | DELAYED_RELEASE_TABLET | Freq: Two times a day (BID) | ORAL | Status: DC
  Administered 2016-04-02 – 2016-04-05 (×6): 40 mg via ORAL
  Filled 2016-04-02 (×6): qty 1

## 2016-04-02 MED ORDER — PHENYLEPHRINE HCL 10 MG/ML IJ SOLN
INTRAMUSCULAR | Status: DC | PRN
  Administered 2016-04-02 (×3): 100 ug via INTRAVENOUS

## 2016-04-02 MED ORDER — ACETAMINOPHEN 10 MG/ML IV SOLN
1000.0000 mg | Freq: Four times a day (QID) | INTRAVENOUS | Status: AC
  Administered 2016-04-02 – 2016-04-03 (×4): 1000 mg via INTRAVENOUS
  Filled 2016-04-02 (×4): qty 100

## 2016-04-02 MED ORDER — MIDAZOLAM HCL 5 MG/5ML IJ SOLN
INTRAMUSCULAR | Status: DC | PRN
  Administered 2016-04-02: 1 mg via INTRAVENOUS

## 2016-04-02 MED ORDER — MEPERIDINE HCL 25 MG/ML IJ SOLN: 12.5000 mg | INTRAMUSCULAR | Status: DC | PRN

## 2016-04-02 MED ORDER — SODIUM CHLORIDE 0.9 % IV SOLN
INTRAVENOUS | Status: DC | PRN
  Administered 2016-04-02: 15:00:00

## 2016-04-02 MED ORDER — DIPHENHYDRAMINE HCL 12.5 MG/5ML PO ELIX: 12.5000 mg | ORAL_SOLUTION | ORAL | Status: DC | PRN

## 2016-04-02 MED ORDER — BUPIVACAINE HCL (PF) 0.5 % IJ SOLN
INTRAMUSCULAR | Status: DC | PRN
  Administered 2016-04-02: 3 mL

## 2016-04-02 MED ORDER — ACETAMINOPHEN 10 MG/ML IV SOLN
INTRAVENOUS | Status: AC
  Filled 2016-04-02: qty 100

## 2016-04-02 MED ORDER — SENNOSIDES-DOCUSATE SODIUM 8.6-50 MG PO TABS
1.0000 | ORAL_TABLET | Freq: Two times a day (BID) | ORAL | Status: DC
  Administered 2016-04-02 – 2016-04-05 (×6): 1 via ORAL
  Filled 2016-04-02 (×6): qty 1

## 2016-04-02 MED ORDER — CEFAZOLIN SODIUM-DEXTROSE 2-4 GM/100ML-% IV SOLN
INTRAVENOUS | Status: AC
  Filled 2016-04-02: qty 100

## 2016-04-02 MED ORDER — FERROUS SULFATE 325 (65 FE) MG PO TABS
325.0000 mg | ORAL_TABLET | Freq: Two times a day (BID) | ORAL | Status: DC
  Administered 2016-04-02 – 2016-04-05 (×6): 325 mg via ORAL
  Filled 2016-04-02 (×6): qty 1

## 2016-04-02 MED ORDER — ACETAMINOPHEN 325 MG PO TABS: 650.0000 mg | ORAL_TABLET | Freq: Four times a day (QID) | ORAL | Status: DC | PRN

## 2016-04-02 MED ORDER — PROPOFOL 10 MG/ML IV BOLUS
INTRAVENOUS | Status: DC | PRN
  Administered 2016-04-02: 20 mg via INTRAVENOUS
  Administered 2016-04-02: 10 mg via INTRAVENOUS
  Administered 2016-04-02: 20 mg via INTRAVENOUS

## 2016-04-02 MED ORDER — NEOMYCIN-POLYMYXIN B GU 40-200000 IR SOLN
Status: AC
  Filled 2016-04-02: qty 20

## 2016-04-02 MED ORDER — BUPIVACAINE HCL (PF) 0.25 % IJ SOLN
INTRAMUSCULAR | Status: DC | PRN
  Administered 2016-04-02: 60 mL

## 2016-04-02 MED ORDER — CHLORHEXIDINE GLUCONATE 4 % EX LIQD
60.0000 mL | Freq: Once | CUTANEOUS | Status: AC
  Administered 2016-04-02: 4 via TOPICAL

## 2016-04-02 MED ORDER — ENOXAPARIN SODIUM 30 MG/0.3ML ~~LOC~~ SOLN
30.0000 mg | Freq: Two times a day (BID) | SUBCUTANEOUS | Status: DC
  Administered 2016-04-03 – 2016-04-05 (×5): 30 mg via SUBCUTANEOUS
  Filled 2016-04-02 (×5): qty 0.3

## 2016-04-02 MED ORDER — TRAMADOL HCL 50 MG PO TABS
50.0000 mg | ORAL_TABLET | ORAL | Status: DC | PRN
  Administered 2016-04-02: 50 mg via ORAL
  Administered 2016-04-03 (×2): 100 mg via ORAL
  Administered 2016-04-03 – 2016-04-04 (×4): 50 mg via ORAL
  Administered 2016-04-04: 100 mg via ORAL
  Administered 2016-04-05 (×2): 50 mg via ORAL
  Filled 2016-04-02 (×2): qty 1
  Filled 2016-04-02 (×2): qty 2
  Filled 2016-04-02 (×3): qty 1
  Filled 2016-04-02: qty 2
  Filled 2016-04-02 (×2): qty 1
  Filled 2016-04-02: qty 2

## 2016-04-02 MED ORDER — SODIUM CHLORIDE 0.9 % IV SOLN
INTRAVENOUS | Status: DC
  Administered 2016-04-02: 1000 mL via INTRAVENOUS
  Administered 2016-04-03: 08:00:00 via INTRAVENOUS

## 2016-04-02 MED ORDER — BACID PO TABS
1.0000 | ORAL_TABLET | Freq: Every day | ORAL | Status: DC
  Administered 2016-04-02 – 2016-04-05 (×4): 1 via ORAL
  Filled 2016-04-02 (×4): qty 1

## 2016-04-02 MED ORDER — ATROPINE SULFATE 0.4 MG/ML IJ SOLN
0.2000 mg | INTRAMUSCULAR | Status: AC
  Administered 2016-04-02: 0.2 mg via INTRAVENOUS
  Filled 2016-04-02: qty 0.5

## 2016-04-02 MED ORDER — TRANEXAMIC ACID 1000 MG/10ML IV SOLN
1000.0000 mg | INTRAVENOUS | Status: AC
  Administered 2016-04-02: 1000 mg via INTRAVENOUS
  Filled 2016-04-02: qty 10

## 2016-04-02 MED ORDER — ATROPINE SULFATE 0.4 MG/ML IJ SOLN: 0.2000 mg | Freq: Once | INTRAMUSCULAR | Status: DC

## 2016-04-02 MED ORDER — PROPOFOL 500 MG/50ML IV EMUL
INTRAVENOUS | Status: DC | PRN
  Administered 2016-04-02: 75 ug/kg/min via INTRAVENOUS

## 2016-04-02 MED ORDER — MAGNESIUM HYDROXIDE 400 MG/5ML PO SUSP: 30.0000 mL | Freq: Every day | ORAL | Status: DC | PRN

## 2016-04-02 MED ORDER — ONDANSETRON HCL 4 MG/2ML IJ SOLN: 4.0000 mg | Freq: Four times a day (QID) | INTRAMUSCULAR | Status: DC | PRN

## 2016-04-02 MED ORDER — MENTHOL 3 MG MT LOZG
1.0000 | LOZENGE | OROMUCOSAL | Status: DC | PRN
Start: 2016-04-02 — End: 2016-04-05
  Filled 2016-04-02: qty 9

## 2016-04-02 MED ORDER — FENTANYL CITRATE (PF) 100 MCG/2ML IJ SOLN
INTRAMUSCULAR | Status: DC | PRN
  Administered 2016-04-02: 50 ug via INTRAVENOUS
  Administered 2016-04-02 (×2): 25 ug via INTRAVENOUS

## 2016-04-02 MED ORDER — AMLODIPINE BESYLATE 5 MG PO TABS
5.0000 mg | ORAL_TABLET | Freq: Every day | ORAL | Status: DC
  Administered 2016-04-03 – 2016-04-05 (×3): 5 mg via ORAL
  Filled 2016-04-02 (×3): qty 1

## 2016-04-02 MED ORDER — SODIUM BICARBONATE 650 MG PO TABS
1300.0000 mg | ORAL_TABLET | Freq: Two times a day (BID) | ORAL | Status: DC
Start: 2016-04-02 — End: 2016-04-05
  Administered 2016-04-02 – 2016-04-05 (×6): 1300 mg via ORAL
  Filled 2016-04-02 (×6): qty 2

## 2016-04-02 MED ORDER — OXYCODONE HCL 5 MG PO TABS
5.0000 mg | ORAL_TABLET | ORAL | Status: DC | PRN
  Administered 2016-04-02: 5 mg via ORAL
  Filled 2016-04-02: qty 1

## 2016-04-02 MED ORDER — ATROPINE SULFATE 0.4 MG/ML IJ SOLN
0.4000 mg | Freq: Once | INTRAMUSCULAR | Status: DC
  Filled 2016-04-02: qty 1

## 2016-04-02 MED ORDER — ONDANSETRON HCL 4 MG PO TABS: 4.0000 mg | ORAL_TABLET | Freq: Four times a day (QID) | ORAL | Status: DC | PRN

## 2016-04-02 MED ORDER — SODIUM CHLORIDE 0.9 % IV SOLN
1000.0000 mg | Freq: Once | INTRAVENOUS | Status: AC
  Administered 2016-04-02: 1000 mg via INTRAVENOUS
  Filled 2016-04-02: qty 10

## 2016-04-02 MED ORDER — SODIUM CHLORIDE 0.9 % IV SOLN
10000.0000 ug | INTRAVENOUS | Status: DC | PRN
  Administered 2016-04-02: 40 ug/min via INTRAVENOUS

## 2016-04-02 MED ORDER — SODIUM CHLORIDE 0.9 % IV BOLUS (SEPSIS)
500.0000 mL | Freq: Once | INTRAVENOUS | Status: AC
  Administered 2016-04-02: 500 mL via INTRAVENOUS

## 2016-04-02 MED ORDER — EPHEDRINE SULFATE 50 MG/ML IJ SOLN
INTRAMUSCULAR | Status: DC | PRN
  Administered 2016-04-02: 5 mg via INTRAVENOUS

## 2016-04-02 MED ORDER — SODIUM CHLORIDE 0.9 % IJ SOLN
INTRAMUSCULAR | Status: AC
  Filled 2016-04-02: qty 50

## 2016-04-02 MED ORDER — CETYLPYRIDINIUM CHLORIDE 0.05 % MT LIQD
7.0000 mL | Freq: Two times a day (BID) | OROMUCOSAL | Status: DC
  Administered 2016-04-02 – 2016-04-05 (×4): 7 mL via OROMUCOSAL

## 2016-04-02 SURGICAL SUPPLY — 60 items
AUTOTRANSFUS HAS 1/8 (MISCELLANEOUS) ×2
BATTERY INSTRU NAVIGATION (MISCELLANEOUS) ×8 IMPLANT
BLADE SAW 1 (BLADE) ×2 IMPLANT
BLADE SAW 1/2 (BLADE) ×2 IMPLANT
CANISTER SUCT 1200ML W/VALVE (MISCELLANEOUS) ×2 IMPLANT
CANISTER SUCT 3000ML (MISCELLANEOUS) ×4 IMPLANT
CAP KNEE TOTAL 3 SIGMA ×2 IMPLANT
CATH TRAY METER 16FR LF (MISCELLANEOUS) ×2 IMPLANT
CEMENT HV SMART SET (Cement) ×4 IMPLANT
COOLER POLAR GLACIER W/PUMP (MISCELLANEOUS) ×2 IMPLANT
CUFF TOURN 24 STER (MISCELLANEOUS) ×2 IMPLANT
CUFF TOURN 30 STER DUAL PORT (MISCELLANEOUS) IMPLANT
DRAPE SHEET LG 3/4 BI-LAMINATE (DRAPES) ×2 IMPLANT
DRSG DERMACEA 8X12 NADH (GAUZE/BANDAGES/DRESSINGS) ×2 IMPLANT
DRSG OPSITE POSTOP 4X14 (GAUZE/BANDAGES/DRESSINGS) ×2 IMPLANT
DRSG TEGADERM 4X4.75 (GAUZE/BANDAGES/DRESSINGS) ×2 IMPLANT
DURAPREP 26ML APPLICATOR (WOUND CARE) ×4 IMPLANT
ELECT CAUTERY BLADE 6.4 (BLADE) ×2 IMPLANT
ELECT REM PT RETURN 9FT ADLT (ELECTROSURGICAL) ×2
ELECTRODE REM PT RTRN 9FT ADLT (ELECTROSURGICAL) ×1 IMPLANT
EX-PIN ORTHOLOCK NAV 4X150 (PIN) ×4 IMPLANT
GLOVE BIOGEL M STRL SZ7.5 (GLOVE) ×8 IMPLANT
GLOVE INDICATOR 8.0 STRL GRN (GLOVE) ×4 IMPLANT
GLOVE SURG 9.0 ORTHO LTXF (GLOVE) ×2 IMPLANT
GLOVE SURG ORTHO 9.0 STRL STRW (GLOVE) ×4 IMPLANT
GOWN STRL REUS W/ TWL LRG LVL3 (GOWN DISPOSABLE) ×2 IMPLANT
GOWN STRL REUS W/TWL 2XL LVL3 (GOWN DISPOSABLE) ×4 IMPLANT
GOWN STRL REUS W/TWL LRG LVL3 (GOWN DISPOSABLE) ×2
HANDPIECE INTERPULSE COAX TIP (DISPOSABLE) ×1
HOLDER FOLEY CATH W/STRAP (MISCELLANEOUS) ×2 IMPLANT
HOOD PEEL AWAY FLYTE STAYCOOL (MISCELLANEOUS) ×4 IMPLANT
KIT RM TURNOVER STRD PROC AR (KITS) ×2 IMPLANT
KNIFE SCULPS 14X20 (INSTRUMENTS) ×2 IMPLANT
LABEL OR SOLS (LABEL) ×2 IMPLANT
NDL SAFETY 18GX1.5 (NEEDLE) ×2 IMPLANT
NEEDLE HYPO 18GX1.5 BLUNT FILL (NEEDLE) ×2 IMPLANT
NEEDLE SPNL 20GX3.5 QUINCKE YW (NEEDLE) ×2 IMPLANT
NS IRRIG 500ML POUR BTL (IV SOLUTION) ×2 IMPLANT
PACK TOTAL KNEE (MISCELLANEOUS) ×2 IMPLANT
PAD WRAPON POLAR KNEE (MISCELLANEOUS) ×1 IMPLANT
PIN DRILL QUICK PACK ×2 IMPLANT
PIN FIXATION 1/8DIA X 3INL (PIN) ×2 IMPLANT
SET HNDPC FAN SPRY TIP SCT (DISPOSABLE) ×1 IMPLANT
SOL .9 NS 3000ML IRR  AL (IV SOLUTION) ×1
SOL .9 NS 3000ML IRR UROMATIC (IV SOLUTION) ×1 IMPLANT
SOL PREP PVP 2OZ (MISCELLANEOUS) ×2
SOLUTION PREP PVP 2OZ (MISCELLANEOUS) ×1 IMPLANT
SPONGE DRAIN TRACH 4X4 STRL 2S (GAUZE/BANDAGES/DRESSINGS) ×2 IMPLANT
STAPLER SKIN PROX 35W (STAPLE) ×2 IMPLANT
SUCTION FRAZIER HANDLE 10FR (MISCELLANEOUS) ×1
SUCTION TUBE FRAZIER 10FR DISP (MISCELLANEOUS) ×1 IMPLANT
SUT VIC AB 0 CT1 36 (SUTURE) ×2 IMPLANT
SUT VIC AB 1 CT1 36 (SUTURE) ×4 IMPLANT
SUT VIC AB 2-0 CT2 27 (SUTURE) ×2 IMPLANT
SYR 20CC LL (SYRINGE) ×2 IMPLANT
SYR 30ML LL (SYRINGE) ×4 IMPLANT
SYSTEM AUTOTRANSFUS DUAL TROCR (MISCELLANEOUS) ×1 IMPLANT
TOWEL OR 17X26 4PK STRL BLUE (TOWEL DISPOSABLE) ×2 IMPLANT
TOWER CARTRIDGE SMART MIX (DISPOSABLE) ×2 IMPLANT
WRAPON POLAR PAD KNEE (MISCELLANEOUS) ×2

## 2016-04-02 NOTE — Anesthesia Preprocedure Evaluation (Signed)
Anesthesia Evaluation  Patient identified by MRN, date of birth, ID band Patient awake    Reviewed: Allergy & Precautions, H&P , NPO status , Patient's Chart, lab work & pertinent test results, reviewed documented beta blocker date and time   Airway Mallampati: II   Neck ROM: full    Dental  (+) Poor Dentition, Teeth Intact   Pulmonary neg pulmonary ROS, former smoker,    Pulmonary exam normal        Cardiovascular hypertension, + CAD, + CABG and + Peripheral Vascular Disease  negative cardio ROS Normal cardiovascular exam Rhythm:regular Rate:Normal     Neuro/Psych negative neurological ROS  negative psych ROS   GI/Hepatic negative GI ROS, Neg liver ROS, hiatal hernia, GERD  Medicated,  Endo/Other  negative endocrine ROS  Renal/GU CRFRenal diseasenegative Renal ROS  negative genitourinary   Musculoskeletal   Abdominal   Peds  Hematology negative hematology ROS (+) anemia ,   Anesthesia Other Findings Past Medical History:   Hypertension                                                 Hypercholesteremia                                           MGUS (monoclonal gammopathy of unknown signifi*              GERD (gastroesophageal reflux disease)                       Arthritis                                                    Anemia                                                       Aortic aneurysm ([facility])                                        Aortic aneurysm (H[facility])                                        Coronary artery disease                                      Diverticulosis                                                 Comment:WITH RUPTURE   History of hiatal hernia  CKD (chronic kidney disease)                                   Comment:ALSO LEFT RENAL MASS   MGUS (monoclonal gammopathy of unknown signifi*            Past Surgical History:   CARDIAC VALVE REPLACEMENT                                        Comment:pig valve   CORONARY ANGIOPLASTY                                          bipass                                                        CHOLECYSTECTOMY                                               ESOPHAGOGASTRODUODENOSCOPY (EGD) WITH PROPOFOL  N/A 02/07/2015      Comment:Procedure: ESOPHAGOGASTRODUODENOSCOPY (EGD)               WITH PROPOFOL;  Surgeon: Hulen Luster, MD;                Location: [facility] ENDOSCOPY;  Service:               Gastroenterology;  Laterality: N/A;   CORONARY ARTERY BYPASS GRAFT                                  AORTIC VALVE REPLACEMENT                                      COLON SURGERY                                                 HERNIA REPAIR                                                 JOINT REPLACEMENT                                               Comment:left knee x4   EYE SURGERY  BMI    Body Mass Index   24.00 kg/m 2     Reproductive/Obstetrics negative OB ROS                             Anesthesia Physical Anesthesia Plan  ASA: III  Anesthesia Plan: General and Spinal   Post-op Pain Management:    Induction:   Airway Management Planned:   Additional Equipment:   Intra-op Plan:   Post-operative Plan:   Informed Consent: I have reviewed the patients History and Physical, chart, labs and discussed the procedure including the risks, benefits and alternatives for the proposed anesthesia with the patient or authorized representative who has indicated his/her understanding and acceptance.   Dental Advisory Given  Plan Discussed with: CRNA  Anesthesia Plan Comments:         Anesthesia Quick Evaluation

## 2016-04-02 NOTE — Brief Op Note (Signed)
04/02/2016  3:40 PM  PATIENT:  [name]  82 y.o. male  PRE-OPERATIVE DIAGNOSIS:  PRIMARY OSTEOARTHRITIS of the right knee  POST-OPERATIVE DIAGNOSIS:  Same  PROCEDURE:  Procedure(s): COMPUTER ASSISTED TOTAL KNEE ARTHROPLASTY (Right)  SURGEON:  Surgeon(s) and Role:    * Dereck Leep, MD - Primary  ASSISTANTS: Vance Peper, PA   ANESTHESIA:   spinal  EBL:  Total I/O In: 1500 [I.V.:1400; IV Piggyback:100] Out: 600 [Urine:500; Blood:100]  BLOOD ADMINISTERED:none  DRAINS: 2 medium drains to a reinfusion system   LOCAL MEDICATIONS USED:  MARCAINE    and OTHER Exparel  SPECIMEN:  No Specimen  DISPOSITION OF SPECIMEN:  N/A  COUNTS:  YES  TOURNIQUET:   108 minutes  DICTATION: .Dragon Dictation  PLAN OF CARE: Admit to inpatient   PATIENT DISPOSITION:  PACU - hemodynamically stable.   Delay start of Pharmacological VTE agent (>24hrs) due to surgical blood loss or risk of bleeding: yes

## 2016-04-02 NOTE — Anesthesia Procedure Notes (Signed)
Spinal Patient location during procedure: OR Start time: 04/02/2016 11:52 AM End time: 04/02/2016 11:55 AM Staffing Resident/CRNA: Alda Berthold Performed by: resident/CRNA  Preanesthetic Checklist Completed: patient identified, site marked, surgical consent, pre-op evaluation, timeout performed, IV checked, risks and benefits discussed and monitors and equipment checked Spinal Block Patient position: sitting Prep: ChloraPrep Patient monitoring: heart rate, continuous pulse ox, blood pressure and cardiac monitor Approach: midline Location: L4-5 Injection technique: single-shot Needle Needle type: [place] and Introducer  Needle gauge: 25 G Needle length: 9 cm Additional Notes Negative paresthesia. Negative blood return. Positive free-flowing CSF. Expiration date of kit checked and confirmed. Patient tolerated procedure well, without complications.

## 2016-04-02 NOTE — Progress Notes (Signed)
Pt became bradycardic after 5mg  Oxy was administered. Pt arrived to ICU complaining of 10/10 pain. Per [place], NP give 50 of Tramadol in addition to q6h IV acetaminophen (next dose 0000).

## 2016-04-02 NOTE — Significant Event (Addendum)
Rapid response was called around 18:30.  Patient had procedure on right knee today- and was given 5mg  Oxycodone per the RN.  Patient's HR was in the 30's when I arrived at bedside.  Dr. Marcello Moores was at bedside and ordered 0.2mg  IV of Atropine and a 563ml bolus.  Once given- patient's HR came up to the 70's and BP was in the low [id number] systolic with MAP above 65.  Patient remained alert and oriented throughout the event but groggy.  Per Dr. Tressia Miners- patient to transfer to the ICU as Stepdown to be monitored over night.

## 2016-04-02 NOTE — Op Note (Signed)
OPERATIVE NOTE  DATE OF SURGERY:  04/02/2016  PATIENT NAME:  [name]   DOB: 1931-04-06  MRN: [id number]  PRE-OPERATIVE DIAGNOSIS: Degenerative arthrosis of the right knee, primary  POST-OPERATIVE DIAGNOSIS:  Same  PROCEDURE:  Right total knee arthroplasty using computer-assisted navigation  SURGEON:  Marciano Sequin. M.D.  ASSISTANT:  Vance Peper, PA (present and scrubbed throughout the case, critical for assistance with exposure, retraction, instrumentation, and closure)  ANESTHESIA: spinal  ESTIMATED BLOOD LOSS: 100 mL  FLUIDS REPLACED: 1200 mL of crystalloid  TOURNIQUET TIME: 108 minutes  DRAINS: 2 medium drains to a reinfusion system  SOFT TISSUE RELEASES: Anterior cruciate ligament, posterior cruciate ligament, deep and superficial medial collateral ligament, patellofemoral ligament   IMPLANTS UTILIZED: DePuy PFC Sigma size 5 posterior stabilized femoral component (cemented), size 5 MBT tibial component (cemented), 41 mm 3 peg oval dome patella (cemented), and a 15 mm stabilized rotating platform polyethylene insert.  INDICATIONS FOR SURGERY: [name] is a 82 y.o. year old male with a long history of progressive knee pain. X-rays demonstrated severe degenerative changes in tricompartmental fashion. The patient had not seen any significant improvement despite conservative nonsurgical intervention. After discussion of the risks and benefits of surgical intervention, the patient expressed understanding of the risks benefits and agree with plans for total knee arthroplasty.   The risks, benefits, and alternatives were discussed at length including but not limited to the risks of infection, bleeding, nerve injury, stiffness, blood clots, the need for revision surgery, cardiopulmonary complications, among others, and they were willing to proceed.  PROCEDURE IN DETAIL: The patient was brought into the operating room and, after adequate spinal anesthesia was achieved, a  tourniquet was placed on the patient's upper thigh. The patient's knee and leg were cleaned and prepped with alcohol and DuraPrep and draped in the usual sterile fashion. A "timeout" was performed as per usual protocol. The lower extremity was exsanguinated using an Esmarch, and the tourniquet was inflated to 300 mmHg. An anterior longitudinal incision was made followed by a standard mid vastus approach. The deep fibers of the medial collateral ligament were elevated in a subperiosteal fashion off of the medial flare of the tibia so as to maintain a continuous soft tissue sleeve. The patella was subluxed laterally and the patellofemoral ligament was incised. Inspection of the knee demonstrated severe degenerative changes with full-thickness loss of articular cartilage. Osteophytes were debrided using a rongeur. Anterior and posterior cruciate ligaments were excised. Two 4.0 mm Schanz pins were inserted in the femur and into the tibia for attachment of the array of trackers used for computer-assisted navigation. Hip center was identified using a circumduction technique. Distal landmarks were mapped using the computer. The distal femur and proximal tibia were mapped using the computer. The distal femoral cutting guide was positioned using computer-assisted navigation so as to achieve a 5 distal valgus cut. The femur was sized and it was felt that a size 5 femoral component was appropriate. A size 5 femoral cutting guide was positioned and the anterior cut was performed and verified using the computer. This was followed by completion of the posterior and chamfer cuts. Femoral cutting guide for the central box was then positioned in the center box cut was performed.  Attention was then directed to the proximal tibia. Medial and lateral menisci were excised. The extramedullary tibial cutting guide was positioned using computer-assisted navigation so as to achieve a 0 varus-valgus alignment and 0 posterior slope. The  cut was performed  and verified using the computer. The proximal tibia was sized and it was felt that a size 5 tibial tray was appropriate. Tibial and femoral trials were inserted followed by insertion of a 10 and subsequently 12.5 mm polyethylene insert. The knee was felt to be tight medially. A Cobb elevator was used to elevate the superficial fibers of the medial collateral ligament. A 15 mm polyethylene insert was used replaced the 12.5 mm insert. This allowed for excellent mediolateral soft tissue balancing both in flexion and in full extension. Finally, the patella was cut and prepared so as to accommodate a 41 mm 3 peg oval dome patella. A patella trial was placed and the knee was placed through a range of motion with excellent patellar tracking appreciated. The femoral trial was removed after debridement of posterior osteophytes. The central post-hole for the tibial component was reamed followed by insertion of a keel punch. Tibial trials were then removed. Cut surfaces of bone were irrigated with copious amounts of normal saline with antibiotic solution using pulsatile lavage and then suctioned dry. Polymethylmethacrylate cement with gentamicin was prepared in the usual fashion using a vacuum mixer. Cement was applied to the cut surface of the proximal tibia as well as along the undersurface of a size 5 MBT tibial component. Tibial component was positioned and impacted into place. Excess cement was removed using [employer] fast streamer. Cement was then applied to the cut surfaces of the femur as well as along the posterior flanges of the size 5 femoral component. The femoral component was positioned and impacted into place. Excess cement was removed using [employer] fast streamer. A 15 mm polyethylene trial was inserted and the knee was brought into full extension with steady axial compression applied. Finally, cement was applied to the backside of a 41 mm 3 peg oval dome patella and the patellar component was positioned and  patellar clamp applied. Excess cement was removed using [employer] fast streamer. After adequate curing of the cement, the tourniquet was deflated after a total tourniquet time of 108 minutes. Hemostasis was achieved using electrocautery. The knee was irrigated with copious amounts of normal saline with antibiotic solution using pulsatile lavage and then suctioned dry. 20 mL of 1.3% Exparel and 60 mL of 0.25% bupivacaine in 40 mL of normal saline was injected along the posterior capsule, medial and lateral gutters, and along the arthrotomy site. A 15 mm stabilized rotating platform polyethylene insert was inserted and the knee was placed through a range of motion with excellent mediolateral soft tissue balancing appreciated and excellent patellar tracking noted. 2 medium drains were placed in the wound bed and brought out through separate stab incisions to be attached to a reinfusion system. The medial parapatellar portion of the incision was reapproximated using interrupted sutures of #1 Vicryl. Subcutaneous tissue was approximated in layers using first #0 Vicryl followed #2-0 Vicryl. The skin was approximated with skin staples. A sterile dressing was applied.  The patient tolerated the procedure well and was transported to the recovery room in stable condition.    Madolyn Ackroyd P. Holley Bouche., M.D.

## 2016-04-02 NOTE — H&P (Signed)
The patient has been re-examined, and the chart reviewed, and there have been no interval changes to the documented history and physical.    The risks, benefits, and alternatives have been discussed at length. The patient expressed understanding of the risks benefits and agreed with plans for surgical intervention.  James P. Hooten, Jr. M.D.    

## 2016-04-02 NOTE — Progress Notes (Addendum)
Around 6:45 nurse was notified that pt was pale, and not very responsive after receiving 5 mg Oxycodone. Nurse assessed pt, HR 32, BP 157/96. Rapid response was called, anesthesiologist and Dr. Marry Guan notified. (CCU nurse, respiratory, nurse supervisor and anesthesiologist) at bedside. Received orders from  Dr. Marcello Moores for 0.2 mg Atropine (CCU administered) and 500 ml NS fluid bolus (infusing). Pt alert and oriented, but groggy. After Atropine pts HR up in the 70's. Dr. Marcello Moores requesting hospitalist consult, MD Vianne Bulls at the bedside. Orders received to transfer pt to CCU.

## 2016-04-02 NOTE — Transfer of Care (Signed)
Immediate Anesthesia Transfer of Care Note  Patient: [name]  Procedure(s) Performed: Procedure(s): COMPUTER ASSISTED TOTAL KNEE ARTHROPLASTY (Right)  Patient Location: PACU  Anesthesia Type:Spinal  Level of Consciousness: awake  Airway & Oxygen Therapy: Patient Spontanous Breathing and Patient connected to face mask oxygen  Post-op Assessment: Report given to RN and Post -op Vital signs reviewed and stable  Post vital signs: Reviewed  Last Vitals:  Filed Vitals:   04/02/16 1006 04/02/16 1536  BP: 153/54 98/57  Pulse: 71 68  Temp: 36.1 C 36.8 C  Resp: 16 16    Last Pain: There were no vitals filed for this visit.       Complications: No apparent anesthesia complications

## 2016-04-02 NOTE — Progress Notes (Signed)
Pt transferred. Report called to [place] in ICU.

## 2016-04-02 NOTE — OR Nursing (Signed)
Dr Andree Elk notified of sodium result. No new orders at this time.  Sacral pad sent with patient to OR

## 2016-04-02 NOTE — Progress Notes (Signed)
Order received for an Internal Medicine consult from Dr. Marcello Moores. Called to Dr. Vianne Bulls.

## 2016-04-02 NOTE — H&P (Signed)
Patient Demographics  [name], is a 82 y.o. male   MRN: [id number]   DOB - 1931-04-18  Admit Date - 04/02/2016    Outpatient Primary MD for the patient is Ola Spurr, Cheral Marker, MD  Consult requested in the Hospital by Dereck Leep, MD, On 04/02/2016    Reason for consult ; bradycardia  History of present illness:[name] is a 82 yr old atient with history of essential hypertension, hyperlipidemia, history of coronary artery disease had right total knee arthroplasty by orthopedic this afternoon. Urgent medical consult is placed because patient is bradycardic with heart rate as low as 30 bpm and he also was very  pale unresponsive/.. She received a dose of oxycodone 5 to milligrams at 5.42 pm for first time .Marland Kitchen Almost after an hour he  developed bradycardia, with heart rate as low as 32. Rapid response team was called, patient received atropine. Commonly also is very pale at that time. Patient EKG repeated did not show any new changes. She did not have chest pain. , heart rate improved to 60-70 bpm and he also feels very weak but he feels better than before. Patient never had bradycardia episodes before looking at His cardiologist  s Dr. Alveria Apley note. With History of -  Past Medical History  Diagnosis Date  . Hypertension   . Hypercholesteremia   . MGUS (monoclonal gammopathy of unknown significance)   . GERD (gastroesophageal reflux disease)   . Arthritis   . Anemia   . Aortic aneurysm ([place])   . Aortic aneurysm ([place])   . Coronary artery disease   . Diverticulosis     WITH RUPTURE  . History of hiatal hernia   . CKD (chronic kidney disease)     ALSO LEFT RENAL MASS  . MGUS (monoclonal gammopathy of unknown significance)       Past Surgical History  Procedure Laterality Date  . Cardiac valve replacement       pig valve  . Coronary angioplasty    . Bipass    . Cholecystectomy    . Esophagogastroduodenoscopy (egd) with propofol N/A 02/07/2015    Procedure: ESOPHAGOGASTRODUODENOSCOPY (EGD) WITH PROPOFOL;  Surgeon: Hulen Luster, MD;  Location: [facility] ENDOSCOPY;  Service: Gastroenterology;  Laterality: N/A;  . Coronary artery bypass graft    . Aortic valve replacement    . Colon surgery    . Hernia repair    . Joint replacement      left knee x4  . Eye surgery    . Knee arthroplasty Right 04/02/2016    Procedure: COMPUTER ASSISTED TOTAL KNEE ARTHROPLASTY;  Surgeon: Dereck Leep, MD;  Location: [facility] ORS;  Service: Orthopedics;  Laterality: Right;       Review of Systems    In addition to the HPI above,  No Fever-chills, No Headache, No changes with Vision or hearing, No problems swallowing food or Liquids, No Chest pain, Cough or Shortness of Breath, No Abdominal pain, No Nausea or Vommitting, Bowel movements are regular,  No Blood in stool or Urine, No dysuria, No new skin rashes or bruises, No new joints pains-aches,  No new weakness, tingling, numbness in any extremity, No recent weight gain or loss, No polyuria, polydypsia or polyphagia, No significant Mental Stressors.  A full 10 point Review of Systems was done, except as stated above, all other Review of Systems were negative.   Social History Social History  Substance Use Topics  . Smoking status: Former Research scientist (life sciences)  . Smokeless tobacco: Never Used  . Alcohol Use: No     Family History Family History  Problem Relation Age of Onset  . Bladder Cancer Neg Hx   . Prostate cancer Neg Hx   . Kidney cancer Neg Hx      Prior to Admission medications   Medication Sig Start Date End Date Taking? Authorizing Provider  amLODipine (NORVASC) 5 MG tablet Take 5 mg by mouth daily.   Yes Historical Provider, MD  aspirin 81 MG tablet Take 81 mg by mouth daily.   Yes Historical Provider, MD  enalapril (VASOTEC) 10 MG tablet Take 20  mg by mouth 2 (two) times daily.    Yes Historical Provider, MD  hydrALAZINE (APRESOLINE) 25 MG tablet Take 25 mg by mouth 2 (two) times daily.  11/08/15  Yes Historical Provider, MD  Lactobacillus-Inulin ([place] PO) Take 1 tablet by mouth daily.   Yes Historical Provider, MD  Melatonin 3 MG TABS Take 1.5 mg by mouth at bedtime as needed (sleep).   Yes Historical Provider, MD  pantoprazole (PROTONIX) 40 MG tablet Take 1 tablet by mouth daily. 02/29/16  Yes Historical Provider, MD  pravastatin (PRAVACHOL) 20 MG tablet Take 20 mg by mouth every evening.   Yes Historical Provider, MD  sodium bicarbonate 650 MG tablet Take 1,300 mg by mouth 2 (two) times daily.   Yes Historical Provider, MD    Anti-infectives    Start     Dose/Rate Route Frequency Ordered Stop   04/02/16 1640  ceFAZolin (ANCEF) IVPB 2g/100 mL premix     2 g 200 mL/hr over 30 Minutes Intravenous Every 6 hours 04/02/16 1640 04/03/16 1644   04/02/16 0937  ceFAZolin (ANCEF) 2-4 GM/100ML-% IVPB    Comments:  KENNEDY, ASHLEY: cabinet override      04/02/16 0937 04/02/16 2144   04/02/16 0317  ceFAZolin (ANCEF) IVPB 2g/100 mL premix     2 g 200 mL/hr over 30 Minutes Intravenous On call to O.R. 04/02/16 0317 04/02/16 1200      Scheduled Meds: . acetaminophen  1,000 mg Intravenous Q6H  . [START ON 04/03/2016] amLODipine  5 mg Oral Daily  . ceFAZolin      .  ceFAZolin (ANCEF) IV  2 g Intravenous Q6H  . enalapril  20 mg Oral BID  . [START ON 04/03/2016] enoxaparin (LOVENOX) injection  30 mg Subcutaneous Q12H  . ferrous sulfate  325 mg Oral BID WC  . hydrALAZINE  25 mg Oral BID  . lactobacillus acidophilus  1 tablet Oral Daily  . metoCLOPramide  10 mg Oral TID AC & HS  . pantoprazole  40 mg Oral BID  . pravastatin  20 mg Oral QPM  . senna-docusate  1 tablet Oral BID  . sodium bicarbonate  1,300 mg Oral BID   Continuous Infusions: . sodium chloride 1,000 mL (04/02/16 1623)   PRN Meds:.acetaminophen **OR**  acetaminophen, alum & mag hydroxide-simeth, bisacodyl, diphenhydrAMINE, magnesium hydroxide, menthol-cetylpyridinium **OR** phenol, meperidine (DEMEROL) injection, ondansetron **OR** ondansetron (ZOFRAN) IV, sodium  phosphate, traMADol  Allergies  Allergen Reactions  . Morphine Anaphylaxis  . Carvedilol     Dizziness and syncope   . Iodinated Diagnostic Agents     Other reaction(s): Asthenia (finding), Other (qualifier value) paralyzed legs    Physical Exam  Vitals  Blood pressure 109/57, pulse 66, temperature 97.4 F (36.3 C), temperature source Oral, resp. rate 18, height 6' (1.829 m), weight 80.28 kg (176 lb 15.8 oz), SpO2 99 %.   1. General  Alert ,awake lying in bed in NAD,   2. Normal affect and insight, Not Suicidal or Homicidal, Awake Alert, Oriented X 3.  3. No F.N deficits, ALL C.Nerves Intact, Strength 5/5 all 4 extremities, Sensation intact all 4 extremities, Plantars down going.  4. Ears and Eyes appear Normal, Conjunctivae clear, PERRLA. Moist Oral Mucosa.  5. Supple Neck, No JVD, No cervical lymphadenopathy appriciated, No Carotid Bruits.  6. Symmetrical Chest wall movement, Good air movement bilaterally, CTAB.  7. RRR, No Gallops, Rubs or Murmurs, No Parasternal Heave.  8. Positive Bowel Sounds, Abdomen Soft, No tenderness, No organomegaly appriciated,No rebound -guarding or rigidity.  9.  No Cyanosis, Normal Skin Turgor, No Skin Rash or Bruise.  10. It is post right TKR.  11. No Palpable Lymph Nodes in Neck or Axillae   Data Review  CBC  Recent Labs Lab 04/02/16 1025  HGB 14.6  HCT 43.0   ------------------------------------------------------------------------------------------------------------------  Chemistries   Recent Labs Lab 04/02/16 1025  NA 128*  K 4.0  GLUCOSE 109*   ------------------------------------------------------------------------------------------------------------------ estimated creatinine clearance is 39.9 mL/min  (by C-G formula based on Cr of 1.54). ------------------------------------------------------------------------------------------------------------------ No results for input(s): TSH, T4TOTAL, T3FREE, THYROIDAB in the last 72 hours.  Invalid input(s): FREET3   Coagulation profile No results for input(s): INR, PROTIME in the last 168 hours. ------------------------------------------------------------------------------------------------------------------- No results for input(s): DDIMER in the last 72 hours. -------------------------------------------------------------------------------------------------------------------  Cardiac Enzymes No results for input(s): CKMB, TROPONINI, MYOGLOBIN in the last 168 hours.  Invalid input(s): CK ------------------------------------------------------------------------------------------------------------------ Invalid input(s): POCBNP   ---------------------------------------------------------------------------------------------------------------  Urinalysis    Component Value Date/Time   COLORURINE STRAW* 03/21/2016 1507   APPEARANCEUR CLEAR* 03/21/2016 1507   APPEARANCEUR Clear 11/22/2015 0950   LABSPEC 1.005 03/21/2016 1507   PHURINE 7.0 03/21/2016 1507   GLUCOSEU NEGATIVE 03/21/2016 1507   HGBUR NEGATIVE 03/21/2016 1507   BILIRUBINUR NEGATIVE 03/21/2016 1507   BILIRUBINUR Negative 11/22/2015 0950   KETONESUR NEGATIVE 03/21/2016 1507   PROTEINUR NEGATIVE 03/21/2016 1507   PROTEINUR Negative 11/22/2015 0950   UROBILINOGEN 1.0 02/16/2010 1336   NITRITE NEGATIVE 03/21/2016 1507   NITRITE Negative 11/22/2015 0950   LEUKOCYTESUR NEGATIVE 03/21/2016 1507   LEUKOCYTESUR Negative 11/22/2015 0950     Imaging results:   Dg Knee Right Port  04/02/2016  CLINICAL DATA:  82 year old male status post right total knee replacement. Postoperative film. EXAM: PORTABLE RIGHT KNEE - 1-2 VIEW COMPARISON:  No priors. FINDINGS: AP and lateral views of the  right knee demonstrate postoperative changes of right total knee arthroplasty. Both the femoral and the tibial components of the prosthesis appear to be properly seated without definite periprosthetic fracture or other immediate complicating features. There is a surgical drain in the soft tissues, and anterior skin staples are noted. There is gas throughout the joint space and in the overlying soft tissues. Numerous vascular calcifications are noted. IMPRESSION: 1. Expected postoperative findings of right total knee arthroplasty, as above, without acute complicating features. 2. Atherosclerosis. Electronically Signed   By: Mauri Brooklyn.D.  On: 04/02/2016 16:00    My personal review of EKG: Rhythm NSR, Rate 70 bpm no ST-T changes.    Assessment & Plan  Active Problems:   S/P total knee arthroplasty    1.  sinus bradycardia with Unresponsiveness;likely secondary to oxycodone: Patient received 0.2 mg of atropine. Heart rate improved. Patient feels better. Patient not on any beta blockers. We are going to watch him overnight in ICU for close monitoring of bradycardia.  discontinued the oxycodone. According to patient's daughter is very sensitive to narcotic pain medications.  Discussed this with patient's daughter, anesthesiologist. And also discussed with ICU nurse, patient's registered nurse   DVT Prophylaxis Heparin -  Lovenox  AM Labs Ordered, also please review Full Orders  Family Communication: Plan discussed with patient and pts daughter   Thank you for the consult, we will follow the patient with you in the Hospital.   [facility] M.D on 04/02/2016 at 7:22 PM  Note: This dictation was prepared with Dragon dictation along with smaller phrase technology. Any transcriptional errors that result from this process are unintentional.

## 2016-04-03 DIAGNOSIS — R001 Bradycardia, unspecified: Secondary | ICD-10-CM | POA: Diagnosis not present

## 2016-04-03 DIAGNOSIS — E871 Hypo-osmolality and hyponatremia: Secondary | ICD-10-CM | POA: Diagnosis not present

## 2016-04-03 DIAGNOSIS — I10 Essential (primary) hypertension: Secondary | ICD-10-CM | POA: Diagnosis not present

## 2016-04-03 LAB — BASIC METABOLIC PANEL
ANION GAP: 5 (ref 5–15)
BUN: 16 mg/dL (ref 6–20)
CHLORIDE: 100 mmol/L — AB (ref 101–111)
CO2: 21 mmol/L — ABNORMAL LOW (ref 22–32)
Calcium: 8 mg/dL — ABNORMAL LOW (ref 8.9–10.3)
Creatinine, Ser: 1.35 mg/dL — ABNORMAL HIGH (ref 0.61–1.24)
GFR, EST AFRICAN AMERICAN: 54 mL/min — AB (ref >60)
GFR, EST NON AFRICAN AMERICAN: 47 mL/min — AB (ref >60)
Glucose, Bld: 124 mg/dL — ABNORMAL HIGH (ref 65–99)
Potassium: 4.1 mmol/L (ref 3.5–5.1)
SODIUM: 126 mmol/L — AB (ref 135–145)

## 2016-04-03 LAB — CBC
HCT: 34.1 % — ABNORMAL LOW (ref 40.0–52.0)
HEMOGLOBIN: 11.8 g/dL — AB (ref 13.0–18.0)
MCH: 33.6 pg (ref 26.0–34.0)
MCHC: 34.5 g/dL (ref 32.0–36.0)
MCV: 97.4 fL (ref 80.0–100.0)
PLATELETS: 175 10*3/uL (ref 150–440)
RBC: 3.5 MIL/uL — AB (ref 4.40–5.90)
RDW: 13.2 % (ref 11.5–14.5)
WBC: 8.8 10*3/uL (ref 3.8–10.6)

## 2016-04-03 NOTE — Care Management Note (Signed)
Case Management Note  Patient Details  Name: [name] MRN: [id number] Date of Birth: 1931-05-06  Subjective/Objective:                  Met with patient while he was in ICU to discuss discharge planning. PT is recommending HHPT and that is what patient would like to do. He lives alone but has made arrangements to have a neighbor stay with him and his daughter will check on him "on and off". He has a front-wheeled walker, bedside commode and shower chair available for his use- no tubs per patient. He would like to use [facility] for [place]. His pharmacy is [facility] : [phone]. There are three SNF in-network with his insurance and [facility] is his first selection if needed. Action/Plan: List of home health agencies provided to patient. Referral made to [facility]. Lovenox 8m #14 called in to [facility]. RNCM will continue to follow.   Expected Discharge Date:                  Expected Discharge Plan:     In-House Referral:     Discharge planning Services     Post Acute Care Choice:  Home Health Choice offered to:  Patient  DME Arranged:    DME Agency:     HH Arranged[facility]  [facility]  Status of Service:  In process, will continue to follow  If discussed at Long Length of Stay Meetings, dates discussed:    Additional Comments:  AMarshell Garfinkel RN 04/03/2016, 10:28 AM

## 2016-04-03 NOTE — Clinical Social Work Note (Signed)
CSW consulted for New SNF. PT is recommending HHPT. RNCM is following for discharge planning needs. FL2 completed should it be needed if the recommendation changes. CSW is signing off at time. Please reconsult if a need arises prior to discharge.   Darden Dates, MSW, LCSW  Clinical Social Worker  [phone]

## 2016-04-03 NOTE — NC FL2 (Signed)
[place] LEVEL OF CARE SCREENING TOOL     IDENTIFICATION  Patient Name: [name] Birthdate: [birth year] Sex: male Admission Date (Current Location): 04/02/2016  [facility] and [place] Number:  Engineering geologist and Address:  [facility], [address], [place], [place] [address]      Provider Number: [id number]  Attending Physician Name and Address:  Dereck Leep, MD  Relative Name and Phone Number:       Current Level of Care: Hospital Recommended Level of Care: [place] Prior Approval Number:    Date Approved/Denied:   PASRR Number:    Discharge Plan: SNF    Current Diagnoses: Patient Active Problem List   Diagnosis Date Noted  . S/P total knee arthroplasty 04/02/2016  . Chronic kidney disease 11/22/2015  . CAD in native artery 11/22/2015  . CKD (chronic kidney disease) stage 3, GFR 30-59 ml/min 06/30/2015  . TI (tricuspid incompetence) 05/11/2015  . Early satiety 01/25/2015  . Benign essential HTN 01/03/2015  . Abdominal aortic aneurysm (AAA) without rupture ([place]) 07/09/2014  . MI (mitral incompetence) 07/09/2014  . Aortic heart valve narrowing 07/09/2014  . Cardiomyopathy, ischemic 06/28/2014  . Arthritis of knee, degenerative 05/17/2014  . Kidney lump 04/09/2013  . Benign prostatic hyperplasia with urinary obstruction 03/17/2013  . Chronic kidney disease, stage IV (severe) ([place]) 03/17/2013  . 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    Orientation RESPIRATION BLADDER Height & Weight     Self, Time, Situation, Place  Normal Continent Weight: 186 lb 4.6 oz (84.5 kg) Height:  6' (182.9 cm)  BEHAVIORAL SYMPTOMS/MOOD NEUROLOGICAL BOWEL NUTRITION STATUS      Continent Diet (Regular Diet)  AMBULATORY STATUS COMMUNICATION OF NEEDS Skin   Limited Assist   Normal                       Personal Care Assistance Level of Assistance  Bathing, Feeding, Dressing Bathing  Assistance: Limited assistance Feeding assistance: Limited assistance Dressing Assistance: Limited assistance     Functional Limitations Info  Sight, Hearing, Speech Sight Info: Adequate Hearing Info: Adequate Speech Info: Adequate    SPECIAL CARE FACTORS FREQUENCY  PT (By licensed PT), OT (By licensed OT)     PT Frequency: 5 OT Frequency: 5            Contractures      Additional Factors Info  Code Status, Allergies Code Status Info: Full Code Allergies Info: Allergies: Morphine, Carvedilol, Iodinated Diagnostic Agents           Current Medications (04/03/2016):  This is the current hospital active medication list Current Facility-Administered Medications  Medication Dose Route Frequency Provider Last Rate Last Dose  . acetaminophen (OFIRMEV) IV 1,000 mg  1,000 mg Intravenous Q6H Dereck Leep, MD   1,000 mg at 04/03/16 0620  . acetaminophen (TYLENOL) tablet 650 mg  650 mg Oral Q6H PRN Dereck Leep, MD       Or  . acetaminophen (TYLENOL) suppository 650 mg  650 mg Rectal Q6H PRN Dereck Leep, MD      . alum & mag hydroxide-simeth (MAALOX/MYLANTA) 200-200-20 MG/5ML suspension 30 mL  30 mL Oral Q4H PRN Dereck Leep, MD      . amLODipine (NORVASC) tablet 5 mg  5 mg Oral Daily Dereck Leep, MD   5 mg at 04/03/16 1011  . antiseptic oral rinse (CPC / CETYLPYRIDINIUM CHLORIDE 0.05%) solution 7  mL  7 mL Mouth Rinse BID Dereck Leep, MD   7 mL at 04/03/16 1000  . bisacodyl (DULCOLAX) suppository 10 mg  10 mg Rectal Daily PRN Dereck Leep, MD      . diphenhydrAMINE (BENADRYL) 12.5 MG/5ML elixir 12.5-25 mg  12.5-25 mg Oral Q4H PRN Dereck Leep, MD      . enalapril (VASOTEC) tablet 20 mg  20 mg Oral BID Dereck Leep, MD   20 mg at 04/03/16 1013  . enoxaparin (LOVENOX) injection 30 mg  30 mg Subcutaneous Q12H Dereck Leep, MD   30 mg at 04/03/16 1007  . fentaNYL (SUBLIMAZE) injection 25 mcg  25 mcg Intravenous Q5 min PRN Molli Barrows, MD      . ferrous sulfate  tablet 325 mg  325 mg Oral BID WC Dereck Leep, MD   325 mg at 04/03/16 0748  . hydrALAZINE (APRESOLINE) tablet 25 mg  25 mg Oral BID Dereck Leep, MD   25 mg at 04/02/16 2131  . lactobacillus acidophilus (BACID) tablet 1 tablet  1 tablet Oral Daily Dereck Leep, MD   1 tablet at 04/03/16 1007  . magnesium hydroxide (MILK OF MAGNESIA) suspension 30 mL  30 mL Oral Daily PRN Dereck Leep, MD      . menthol-cetylpyridinium (CEPACOL) lozenge 3 mg  1 lozenge Oral PRN Dereck Leep, MD       Or  . phenol (CHLORASEPTIC) mouth spray 1 spray  1 spray Mouth/Throat PRN Dereck Leep, MD      . meperidine (DEMEROL) injection 12.5-25 mg  12.5-25 mg Intravenous Q4H PRN Dereck Leep, MD      . metoCLOPramide (REGLAN) tablet 10 mg  10 mg Oral TID AC & HS Dereck Leep, MD   10 mg at 04/03/16 0748  . ondansetron (ZOFRAN) tablet 4 mg  4 mg Oral Q6H PRN Dereck Leep, MD       Or  . ondansetron (ZOFRAN) injection 4 mg  4 mg Intravenous Q6H PRN Dereck Leep, MD      . ondansetron (ZOFRAN) injection 4 mg  4 mg Intravenous Once PRN Molli Barrows, MD      . pantoprazole (PROTONIX) EC tablet 40 mg  40 mg Oral BID Dereck Leep, MD   40 mg at 04/03/16 1008  . pravastatin (PRAVACHOL) tablet 20 mg  20 mg Oral QPM Dereck Leep, MD   20 mg at 04/02/16 2131  . senna-docusate (Senokot-S) tablet 1 tablet  1 tablet Oral BID Dereck Leep, MD   1 tablet at 04/03/16 1007  . sodium bicarbonate tablet 1,300 mg  1,300 mg Oral BID Dereck Leep, MD   1,300 mg at 04/03/16 1007  . sodium phosphate (FLEET) 7-19 GM/118ML enema 1 enema  1 enema Rectal Once PRN Dereck Leep, MD      . traMADol Veatrice Bourbon) tablet 50-100 mg  50-100 mg Oral Q4H PRN Dereck Leep, MD   50 mg at 04/03/16 [id number]     Discharge Medications: Please see discharge summary for a list of discharge medications.  Relevant Imaging Results:  Relevant Lab Results:   Additional Information SSN:  [id number]  Darden Dates, LCSW

## 2016-04-03 NOTE — Evaluation (Signed)
Physical Therapy Evaluation Patient Details Name: [name] MRN: [id number] DOB: July 04, 1931 Today's Date: 04/03/2016   History of Present Illness  admitted for acute hospitalization status post R TKR (04/02/16), WBAT.  Hospital course significant for transfer to CCU after episode of sinus bradycardia and decreased responsiveness (after receiving oxycodone) requiring administration of atropine; now normalized/stable and appropriately responsive to activity.   Clinical Impression  Upon evaluation, patient alert and oriented; follows all commands and demonstrates good insight/safety awareness. Patient very motivated; demonstrates excellent effort with all mobility efforts.  Good R LE strength and ROM post-op (strength at least 3-/5; ROM 6-80 degrees) with full sensory return reported.  Able to complete 10 SLR with minimal/no lag and good quad contraction/control.  Currently completes bed mobility with mod indep; sit/stand, basic transfers and short-distance gait (5') with RW, cga/min assist.  Limited active use of R LE with movement transitions, but anticipate improvement as comfort and confidence with movement increases. Patient vitals stable and WFL throughout session; good alertness/responsiveness, HR maintained in 70s at rest and with activity. Would benefit from skilled PT to address above deficits and promote optimal return to PLOF; Recommend transition to [place] upon discharge from acute hospitalization.     Follow Up Recommendations Home health PT    Equipment Recommendations  Rolling walker with 5" wheels    Recommendations for Other Services       Precautions / Restrictions Precautions Precautions: Fall Restrictions Weight Bearing Restrictions: Yes RLE Weight Bearing: Weight bearing as tolerated      Mobility  Bed Mobility Overal bed mobility: Modified Independent             General bed mobility comments: Supine/sit, transition towards R, mod indep; use of bedrails  to assist  Transfers Overall transfer level: Needs assistance Equipment used: Rolling walker (2 wheeled) Transfers: Sit to/from Stand Sit to Stand: Min guard;Min assist         General transfer comment: limited active use of R LE with movement transition  Ambulation/Gait Ambulation/Gait assistance: Min guard;Min assist Ambulation Distance (Feet): 5 Feet Assistive device: Rolling walker (2 wheeled)       General Gait Details: 3-point, step-to gait pattern with decreased stance time R LE; good knee control, no buckling or overt LOB noted.  Minimal increase in pain reported.  Stairs            Wheelchair Mobility    Modified Rankin (Stroke Patients Only)       Balance Overall balance assessment: Needs assistance Sitting-balance support: No upper extremity supported;Feet supported Sitting balance-Leahy Scale: Normal     Standing balance support: Bilateral upper extremity supported Standing balance-Leahy Scale: Good                               Pertinent Vitals/Pain Pain Assessment: 0-10 Pain Score: 2  Pain Location: R knee Pain Descriptors / Indicators: Aching;Sore    Home Living Family/patient expects to be discharged to:: Private residence Living Arrangements: Alone Available Help at Discharge: Family;Available PRN/intermittently;Friend(s) Type of Home: House Home Access: Level entry     Home Layout: One level Home Equipment: Walker - 2 wheels;Walker - 4 wheels;Cane - single point      Prior Function Level of Independence: Independent         Comments: Indep with all ADLs, household and community mobility, + driving; using SPC in recent 2-3 weeks due to progressive R knee pain.  Hand Dominance        Extremity/Trunk Assessment   Upper Extremity Assessment: Overall WFL for tasks assessed           Lower Extremity Assessment:  (R LE grossly 3-/5 at knee; hip and ankle WFL.  Full sensation intact.  Good control of  movement, able to complete 10 SLR with minimal lag noted.)         Communication   Communication: No difficulties  Cognition Arousal/Alertness: Awake/alert Behavior During Therapy: WFL for tasks assessed/performed Overall Cognitive Status: Within Functional Limits for tasks assessed                      General Comments      Exercises Total Joint Exercises Goniometric ROM: 6-80 degrees AAROM Other Exercises Other Exercises: Supine LE therex, 1x10, AROM for muscular strength/endurance with functional mobility.  Fair/good quad contraction and control noted; able to complete 10 SLR without difficulty.      Assessment/Plan    PT Assessment Patient needs continued PT services  PT Diagnosis Difficulty walking;Generalized weakness   PT Problem List Decreased strength;Decreased range of motion;Decreased activity tolerance;Decreased balance;Decreased mobility;Cardiopulmonary status limiting activity;Pain  PT Treatment Interventions DME instruction;Gait training;Stair training;Functional mobility training;Therapeutic activities;Patient/family education;Therapeutic exercise   PT Goals (Current goals can be found in the Care Plan section) Acute Rehab PT Goals Patient Stated Goal: to do the best I can PT Goal Formulation: With patient Time For Goal Achievement: 04/17/16 Potential to Achieve Goals: Good Additional Goals Additional Goal #1: R knee ROM 0-90 degrees for functional movement with mobility    Frequency BID   Barriers to discharge Decreased caregiver support      Co-evaluation               End of Session Equipment Utilized During Treatment: Gait belt Activity Tolerance: Patient tolerated treatment well Patient left: with call bell/phone within reach;in chair (chair alarm pad in place, box not available) Nurse Communication: Mobility status (reviewed order for bone foam to R LE with return to bed with primary RN)         [id number] PT Time  Calculation (min) (ACUTE ONLY): 28 min   Charges:   PT Evaluation $PT Eval Moderate Complexity: 1 Procedure PT Treatments $Therapeutic Exercise: 8-22 mins   PT G Codes:        Maraki Macquarrie H. Owens Shark, PT, DPT, NCS 04/03/2016, 10:22 AM [phone]

## 2016-04-03 NOTE — Progress Notes (Signed)
[facility] [place] NAME: [name]    MR#:  [id number]  DATE OF BIRTH:  12-May-1931  SUBJECTIVE:  CHIEF COMPLAINT:  No chief complaint on file.  Feels better. No chest pain or shortness of breath. Pain in right knee is well controlled. Normal sinus rhythm with heart rate in 70s.  REVIEW OF SYSTEMS:    Review of Systems  Constitutional: Positive for malaise/fatigue. Negative for fever and chills.  HENT: Negative for sore throat.   Eyes: Negative for blurred vision, double vision and pain.  Respiratory: Negative for cough, hemoptysis, shortness of breath and wheezing.   Cardiovascular: Negative for chest pain, palpitations, orthopnea and leg swelling.  Gastrointestinal: Negative for heartburn, nausea, vomiting, abdominal pain, diarrhea and constipation.  Genitourinary: Negative for dysuria and hematuria.  Musculoskeletal: Positive for joint pain. Negative for back pain.  Skin: Negative for rash.  Neurological: Negative for sensory change, speech change, focal weakness and headaches.  Endo/Heme/Allergies: Does not bruise/bleed easily.  Psychiatric/Behavioral: Negative for depression. The patient is not nervous/anxious.     DRUG ALLERGIES:   Allergies  Allergen Reactions  . Morphine Anaphylaxis  . Carvedilol     Dizziness and syncope   . Iodinated Diagnostic Agents     Other reaction(s): Asthenia (finding), Other (qualifier value) paralyzed legs    VITALS:  Blood pressure 130/64, pulse 74, temperature 97.7 F (36.5 C), temperature source Oral, resp. rate 15, height 6' (1.829 m), weight 84.5 kg (186 lb 4.6 oz), SpO2 97 %.  PHYSICAL EXAMINATION:   Physical Exam  GENERAL:  82 y.o.-year-old patient lying in the bed with no acute distress.  EYES: Pupils equal, round, reactive to light and accommodation. No scleral icterus. Extraocular muscles intact.  HEENT: Head atraumatic, normocephalic. Oropharynx and nasopharynx  clear.  NECK:  Supple, no jugular venous distention. No thyroid enlargement, no tenderness.  LUNGS: Normal breath sounds bilaterally, no wheezing, rales, rhonchi. No use of accessory muscles of respiration.  CARDIOVASCULAR: S1, S2 normal. No murmurs, rubs, or gallops.  ABDOMEN: Soft, nontender, nondistended. Bowel sounds present. No organomegaly or mass.  EXTREMITIES: No cyanosis, clubbing or edema b/l.   Right knee dressing with drain. NEUROLOGIC: Cranial nerves II through XII are intact. No focal Motor or sensory deficits b/l.   PSYCHIATRIC: The patient is alert and oriented x 3.  SKIN: No obvious rash, lesion, or ulcer.   LABORATORY PANEL:   CBC  Recent Labs Lab 04/03/16 0424  WBC 8.8  HGB 11.8*  HCT 34.1*  PLT 175   ------------------------------------------------------------------------------------------------------------------ Chemistries   Recent Labs Lab 04/03/16 0424  NA 126*  K 4.1  CL 100*  CO2 21*  GLUCOSE 124*  BUN 16  CREATININE 1.35*  CALCIUM 8.0*   ------------------------------------------------------------------------------------------------------------------  Cardiac Enzymes No results for input(s): TROPONINI in the last 168 hours. ------------------------------------------------------------------------------------------------------------------  RADIOLOGY:  Dg Knee Right Port  04/02/2016  CLINICAL DATA:  82 year old male status post right total knee replacement. Postoperative film. EXAM: PORTABLE RIGHT KNEE - 1-2 VIEW COMPARISON:  No priors. FINDINGS: AP and lateral views of the right knee demonstrate postoperative changes of right total knee arthroplasty. Both the femoral and the tibial components of the prosthesis appear to be properly seated without definite periprosthetic fracture or other immediate complicating features. There is a surgical drain in the soft tissues, and anterior skin staples are noted. There is gas throughout the joint space and  in the overlying soft tissues. Numerous vascular calcifications are noted. IMPRESSION:  1. Expected postoperative findings of right total knee arthroplasty, as above, without acute complicating features. 2. Atherosclerosis. Electronically Signed   By: Vinnie Langton M.D.   On: 04/02/2016 16:00     ASSESSMENT AND PLAN:   * Sinus Bradycardia due to oxycodone Heart rate is in 70s. Minimize narcotic use. Can transfer to medical floor and continue telemetry monitoring.  * Hypertension Stable  * CKD stage III Stable.  * Chronic hyponatremia stable  * DVT prophylaxis per orthopedics  All the records are reviewed and case discussed with Care Management/Social Workerr. Management plans discussed with the patient, family and they are in agreement.  CODE STATUS: FULL CODE  DVT Prophylaxis: SCDs  TOTAL TIME TAKING CARE OF THIS PATIENT: 40 minutes.   Thank you for the consult. We'll sign off. Please call with questions.  Hillary Bow R M.D on 04/03/2016 at 10:53 AM  Between 7am to 6pm - Pager - [phone]  After 6pm go to [email or web address] - password [email or web address] [name]  [place] [facility]  Office  [phone]  CC: Primary care physician; FITZGERALD, DAVID Mamie Nick, MD  Note: This dictation was prepared with Dragon dictation along with smaller phrase technology. Any transcriptional errors that result from this process are unintentional.

## 2016-04-03 NOTE — Anesthesia Postprocedure Evaluation (Signed)
Anesthesia Post Note  Patient: [name]  Procedure(s) Performed: Procedure(s) (LRB): COMPUTER ASSISTED TOTAL KNEE ARTHROPLASTY (Right)  Patient location during evaluation: ICU Anesthesia Type: Spinal Level of consciousness: patient cooperative and oriented Pain management: pain level controlled Vital Signs Assessment: post-procedure vital signs reviewed and stable Respiratory status: spontaneous breathing, nonlabored ventilation and respiratory function stable Cardiovascular status: blood pressure returned to baseline and stable Postop Assessment: no headache and no backache Anesthetic complications: no    Last Vitals:  Filed Vitals:   04/03/16 0500 04/03/16 0600  BP: 134/63 121/58  Pulse: 59 58  Temp:    Resp: 11 11    Last Pain:  Filed Vitals:   04/03/16 0623  PainSc: 5                  Proofreader

## 2016-04-03 NOTE — Progress Notes (Signed)
Physical Therapy Treatment Patient Details Name: EMILE MENTION MRN: [id number] DOB: Jul 14, 1931 Today's Date: 04/03/2016    History of Present Illness admitted for acute hospitalization status post R TKR (04/02/16), WBAT.  Hospital course significant for transfer to CCU after episode of sinus bradycardia and decreased responsiveness (after receiving oxycodone) requiring administration of atropine; now normalized/stable and appropriately responsive to activity.     PT Comments    Agrees to pm session.  Participated in exercises as described below. Transitioned to edge of bed without assist.  Stood with min a x 1. Amb 31' with walker and min guard without loss of balance or buckling.   HR 71 at rest. Increased to 88 following 50' gait and use of bathroom.  Pt did well with mobility this afternoon.       Follow Up Recommendations  Home health PT     Equipment Recommendations  Rolling walker with 5" wheels    Recommendations for Other Services       Precautions / Restrictions Precautions Precautions: Fall Restrictions Weight Bearing Restrictions: Yes RLE Weight Bearing: Weight bearing as tolerated    Mobility  Bed Mobility Overal bed mobility: Modified Independent             General bed mobility comments: sup to sit  Transfers Overall transfer level: Needs assistance Equipment used: Rolling walker (2 wheeled) Transfers: Sit to/from Stand Sit to Stand: Min assist            Ambulation/Gait Ambulation/Gait assistance: Min guard Ambulation Distance (Feet): 50 Feet Assistive device: Rolling walker (2 wheeled) Gait Pattern/deviations: Step-to pattern;Decreased step length - left;Decreased stance time - right         Science writer    Modified Rankin (Stroke Patients Only)       Balance Overall balance assessment: Needs assistance Sitting-balance support: Feet supported Sitting balance-Leahy Scale: Normal     Standing  balance support: Bilateral upper extremity supported Standing balance-Leahy Scale: Good                      Cognition Arousal/Alertness: Awake/alert Behavior During Therapy: WFL for tasks assessed/performed Overall Cognitive Status: Within Functional Limits for tasks assessed                      Exercises Total Joint Exercises Ankle Circles/Pumps: AROM;Both;10 reps;Supine Quad Sets: AROM;Right;10 reps;Supine Heel Slides: AAROM;Right;10 reps;Supine Hip ABduction/ADduction: AAROM;Right;10 reps;Supine Straight Leg Raises: AROM;Right;10 reps;Supine Long Arc Quad: AROM;Right;10 reps;Seated Knee Flexion: AROM;Right;10 reps;Seated    General Comments        Pertinent Vitals/Pain Pain Assessment: 0-10 Pain Score: 2  Pain Location: R knee Pain Descriptors / Indicators: Sore    Home Living                      Prior Function            PT Goals (current goals can now be found in the care plan section) Progress towards PT goals: Progressing toward goals    Frequency  BID    PT Plan Current plan remains appropriate    Co-evaluation             End of Session Equipment Utilized During Treatment: Gait belt Activity Tolerance: Patient tolerated treatment well Patient left: with call bell/phone within reach;in chair;with chair alarm set     Time: [id number] PT Time Calculation (min) (ACUTE  ONLY): 24 min  Charges:  $Gait Training: 8-22 mins $Therapeutic Exercise: 8-22 mins                    G Codes:      Chesley Noon, PTA 04/03/2016, 3:49 PM

## 2016-04-03 NOTE — Progress Notes (Signed)
Subjective: 1 Day Post-Op Procedure(s) (LRB): COMPUTER ASSISTED TOTAL KNEE ARTHROPLASTY (Right) Patient reports pain as 5 on 0-10 scale.   Patient is well, and has had no acute complaints or problems We will start therapy today.  Plan is to go Home after hospital stay. no nausea and no vomiting Patient denies any chest pains or shortness of breath. Pt does states that he may have had similar issues in the past with oxycodone but it has been too long to remember. Has taken hydrocodone without problems  Objective: Vital signs in last 24 hours: Temp:  [97 F (36.1 C)-98.3 F (36.8 C)] 97.8 F (36.6 C) (07/18 0400) Pulse Rate:  [40-83] 58 (07/18 0600) Resp:  [10-18] 11 (07/18 0600) BP: (94-157)/(51-96) 121/58 mmHg (07/18 0600) SpO2:  [95 %-100 %] 96 % (07/18 0600) Weight:  [80.28 kg (176 lb 15.8 oz)-84.5 kg (186 lb 4.6 oz)] 84.5 kg (186 lb 4.6 oz) (07/17 1935) Heels are non tender and bone foam in place.  Intake/Output from previous day: 07/17 0701 - 07/18 0700 In: 4221.7 [P.O.:760; I.V.:2861.7; IV [id number] Out: 3205 [Urine:2460; Drains:645; Blood:100] Intake/Output this shift:     Recent Labs  04/02/16 1025 04/03/16 0424  HGB 14.6 11.8*    Recent Labs  04/02/16 1025 04/03/16 0424  WBC  --  8.8  RBC  --  3.50*  HCT 43.0 34.1*  PLT  --  175    Recent Labs  04/02/16 1025 04/03/16 0424  NA 128* 126*  K 4.0 4.1  CL  --  100*  CO2  --  21*  BUN  --  16  CREATININE  --  1.35*  GLUCOSE 109* 124*  CALCIUM  --  8.0*   No results for input(s): LABPT, INR in the last 72 hours.  EXAM General - Patient is Alert, Appropriate and Oriented Extremity - Neurologically intact Neurovascular intact Sensation intact distally Intact pulses distally Dorsiflexion/Plantar flexion intact Compartment soft Dressing - dressing C/D/I Motor Function - intact, moving foot and toes well on exam. Not able to do SLR on own but has good muscle tone.  Past Medical History   Diagnosis Date  . Hypertension   . Hypercholesteremia   . MGUS (monoclonal gammopathy of unknown significance)   . GERD (gastroesophageal reflux disease)   . Arthritis   . Anemia   . Aortic aneurysm ([place])   . Aortic aneurysm ([place])   . Coronary artery disease   . Diverticulosis     WITH RUPTURE  . History of hiatal hernia   . CKD (chronic kidney disease)     ALSO LEFT RENAL MASS  . MGUS (monoclonal gammopathy of unknown significance)     Assessment/Plan: 1 Day Post-Op Procedure(s) (LRB): COMPUTER ASSISTED TOTAL KNEE ARTHROPLASTY (Right) Active Problems:   S/P total knee arthroplasty  Estimated body mass index is 25.26 kg/(m^2) as calculated from the following:   Height as of this encounter: 6' (1.829 m).   Weight as of this encounter: 84.5 kg (186 lb 4.6 oz). Advance diet Up with therapy D/C IV fluids Discharge home with home health  Labs: reviewed DVT Prophylaxis - Lovenox, Foot Pumps and TED hose Weight-Bearing as tolerated to right leg D/C O2 and Pulse OX and try on Room Air Begin working on a bowel movement Labs tomorrow am Discontinue foley as soon as poss. May try hydrocodone for more intense pain Please transfer to orthopedic floor as soon as poss and medically stable  Prapti Grussing R. [place]  04/03/2016, 7:16 AM

## 2016-04-03 NOTE — Progress Notes (Addendum)
Pt has remained alert and oriented x4 with c/o of right knee pain 5/10. Tramadol 50mg  was given around 0630 this am with relief of pain and no bradycardiac adverse effects. Pt has participated in PT this am and tolerated transferring to the recliner very well. Pt still has pain 5/10 but has denied pain medication around 1030. Pt is currently resting in recliner. Lung sounds are clear to auscultation on RA. Resp even and unlabored. SB/NSR on cardiac monitor. Pt with orders to transfer to med-surg floor.

## 2016-04-04 LAB — CBC
HCT: 34.4 % — ABNORMAL LOW (ref 40.0–52.0)
Hemoglobin: 11.9 g/dL — ABNORMAL LOW (ref 13.0–18.0)
MCH: 33.4 pg (ref 26.0–34.0)
MCHC: 34.7 g/dL (ref 32.0–36.0)
MCV: 96.2 fL (ref 80.0–100.0)
Platelets: 168 10*3/uL (ref 150–440)
RBC: 3.58 MIL/uL — ABNORMAL LOW (ref 4.40–5.90)
RDW: 13.6 % (ref 11.5–14.5)
WBC: 8.8 10*3/uL (ref 3.8–10.6)

## 2016-04-04 LAB — BASIC METABOLIC PANEL
ANION GAP: 6 (ref 5–15)
BUN: 16 mg/dL (ref 6–20)
CO2: 22 mmol/L (ref 22–32)
Calcium: 8.4 mg/dL — ABNORMAL LOW (ref 8.9–10.3)
Chloride: 99 mmol/L — ABNORMAL LOW (ref 101–111)
Creatinine, Ser: 1.33 mg/dL — ABNORMAL HIGH (ref 0.61–1.24)
GFR calc Af Amer: 55 mL/min — ABNORMAL LOW (ref >60)
GFR, EST NON AFRICAN AMERICAN: 48 mL/min — AB (ref >60)
Glucose, Bld: 113 mg/dL — ABNORMAL HIGH (ref 65–99)
POTASSIUM: 4 mmol/L (ref 3.5–5.1)
Sodium: 127 mmol/L — ABNORMAL LOW (ref 135–145)

## 2016-04-04 LAB — GLUCOSE, CAPILLARY
Glucose-Capillary: 120 mg/dL — ABNORMAL HIGH (ref 65–99)
Glucose-Capillary: 110 mg/dL — ABNORMAL HIGH (ref 65–99)

## 2016-04-04 MED ORDER — LACTULOSE 10 GM/15ML PO SOLN: 10.0000 g | Freq: Two times a day (BID) | ORAL | Status: DC | PRN

## 2016-04-04 NOTE — Progress Notes (Signed)
Physical Therapy Treatment Patient Details Name: [name] MRN: [id number] DOB: 22-Jul-1931 Today's Date: 04/04/2016    History of Present Illness Pt. is an 82 y.o.R TKR (04/02/16). Pt. was initially transferred  to CCU after episode of sinus bradycardia and decreased responsiveness (after receiving oxycodone) requiring administration of atropine; now normalized/stable and appropriately responsive to activity.     PT Comments    Pt presented in chair agreeable to therapy.  Performed seated therex as noted in chart, demonstrating good understanding of all exercises.  Pt then ambulated 180ft x1 with x 1 standing rest 2/2 fatigue.  Improved step length, heel strike, and knee flexion noted.  Pt with good tolerance to all activities this pm session .   Follow Up Recommendations  Home health PT     Equipment Recommendations  Rolling walker with 5" wheels    Recommendations for Other Services       Precautions / Restrictions Precautions Precautions: Fall Restrictions Weight Bearing Restrictions: Yes RLE Weight Bearing: Weight bearing as tolerated    Mobility  Bed Mobility Overal bed mobility: Modified Independent                Transfers Overall transfer level: Needs assistance Equipment used: Rolling walker (2 wheeled)   Sit to Stand: Min guard         General transfer comment: Cues for hand placment   Ambulation/Gait Ambulation/Gait assistance: Min guard Ambulation Distance (Feet): 200 Feet Assistive device: Rolling walker (2 wheeled) Gait Pattern/deviations: Step-through pattern;Decreased step length - right;Decreased stance time - right     General Gait Details: Improving step length and knee flexion noted    Stairs            Wheelchair Mobility    Modified Rankin (Stroke Patients Only)       Balance Overall balance assessment: Needs assistance Sitting-balance support: Feet supported Sitting balance-Leahy Scale: Normal     Standing  balance support: Bilateral upper extremity supported                        Cognition Arousal/Alertness: Awake/alert Behavior During Therapy: WFL for tasks assessed/performed Overall Cognitive Status: Within Functional Limits for tasks assessed                      Exercises Total Joint Exercises Ankle Circles/Pumps: AROM;Both;15 reps;Seated Quad Sets: Strengthening;Both;15 reps;Seated Short Arc Quad: AROM;Right;15 reps;Seated Heel Slides: AROM;Strengthening;Right;15 reps;Seated Hip ABduction/ADduction: AROM;Right;15 reps;Seated Long Arc Quad: AROM;Right;15 reps;Seated Knee Flexion: PROM;Right Goniometric ROM: 3-85    General Comments        Pertinent Vitals/Pain Pain Assessment: 0-10 Pain Score: 2  Pain Location: R knee    Home Living Family/patient expects to be discharged to:: Private residence Living Arrangements: Alone Available Help at Discharge: Family;Available PRN/intermittently;Friend(s) Type of Home: House Home Access: Level entry   Home Layout: One level Home Equipment: Walker - 2 wheels;Walker - 4 wheels;Cane - single point      Prior Function Level of Independence: Independent      Comments: Indep with all ADLs, household and community mobility, + driving; using SPC in recent 2-3 weeks due to progressive R knee pain.    PT Goals (current goals can now be found in the care plan section) Acute Rehab PT Goals Patient Stated Goal: To return home Progress towards PT goals: Progressing toward goals    Frequency  BID    PT Plan Current plan remains appropriate  Co-evaluation             End of Session Equipment Utilized During Treatment: Gait belt Activity Tolerance: Patient tolerated treatment well Patient left: in chair;with call bell/phone within reach;with chair alarm set     Time: 1320-1346 PT Time Calculation (min) (ACUTE ONLY): 26 min  Charges:  $Gait Training: 8-22 mins $Therapeutic Exercise: 8-22 mins                     G Codes:      Lora Glomski 04/05/16, 2:29 PM  Venera Privott, PTA

## 2016-04-04 NOTE — Care Management (Signed)
[facility] notified that planned discharge to home tomorrow. RNCM will continue to follow.

## 2016-04-04 NOTE — Consult Note (Signed)
Reason for Consult: Bradycardia, near syncope Referring Physician: Dr. [facility], Dr. Adaline Sill [name] is an 82 y.o. male.  HPI: 82 year old male known coronary disease hypertension coronary bypass surgery aortic valve replacement with a tissue valve appears ago. Patient was treated with oxycodone was cause significant altered mental status 12 tachycardia as well as episodes of bradycardia. Patient was treated with atropine because of bradycardia. EKG remained unremarkable denies any tobacco spelled to syncope states this happened once before when he took morphine. Symptoms resolved relatively quickly patient was used physical therapy he thinks is related to medication. Denies any chest pain no worsening shortness of breath.  Past Medical History  Diagnosis Date  . Hypertension   . Hypercholesteremia   . MGUS (monoclonal gammopathy of unknown significance)   . GERD (gastroesophageal reflux disease)   . Arthritis   . Anemia   . Aortic aneurysm ([place])   . Aortic aneurysm ([place])   . Coronary artery disease   . Diverticulosis     WITH RUPTURE  . History of hiatal hernia   . CKD (chronic kidney disease)     ALSO LEFT RENAL MASS  . MGUS (monoclonal gammopathy of unknown significance)     Past Surgical History  Procedure Laterality Date  . Cardiac valve replacement      pig valve  . Coronary angioplasty    . Bipass    . Cholecystectomy    . Esophagogastroduodenoscopy (egd) with propofol N/A 02/07/2015    Procedure: ESOPHAGOGASTRODUODENOSCOPY (EGD) WITH PROPOFOL;  Surgeon: Hulen Luster, MD;  Location: [facility] ENDOSCOPY;  Service: Gastroenterology;  Laterality: N/A;  . Coronary artery bypass graft    . Aortic valve replacement    . Colon surgery    . Hernia repair    . Joint replacement      left knee x4  . Eye surgery    . Knee arthroplasty Right 04/02/2016    Procedure: COMPUTER ASSISTED TOTAL KNEE ARTHROPLASTY;  Surgeon: Dereck Leep, MD;  Location: [facility] ORS;  Service:  Orthopedics;  Laterality: Right;    Family History  Problem Relation Age of Onset  . Bladder Cancer Neg Hx   . Prostate cancer Neg Hx   . Kidney cancer Neg Hx     Social History:  reports that he has quit smoking. He has never used smokeless tobacco. He reports that he does not drink alcohol or use illicit drugs.  Allergies:  Allergies  Allergen Reactions  . Morphine Anaphylaxis  . Carvedilol     Dizziness and syncope   . Iodinated Diagnostic Agents     Other reaction(s): Asthenia (finding), Other (qualifier value) paralyzed legs    Medications: I have reviewed the patient's current medications.  Results for orders placed or performed during the hospital encounter of 04/02/16 (from the past 48 hour(s))  Glucose, capillary     Status: Abnormal   Collection Time: 04/02/16  7:41 PM  Result Value Ref Range   Glucose-Capillary 112 (H) 65 - 99 mg/dL  CBC     Status: Abnormal   Collection Time: 04/03/16  4:24 AM  Result Value Ref Range   WBC 8.8 3.8 - 10.6 K/uL   RBC 3.50 (L) 4.40 - 5.90 MIL/uL   Hemoglobin 11.8 (L) 13.0 - 18.0 g/dL   HCT 34.1 (L) 40.0 - 52.0 %   MCV 97.4 80.0 - 100.0 fL   MCH 33.6 26.0 - 34.0 pg   MCHC 34.5 32.0 - 36.0 g/dL   RDW 13.2  11.5 - 14.5 %   Platelets 175 150 - 440 K/uL  Basic metabolic panel     Status: Abnormal   Collection Time: 04/03/16  4:24 AM  Result Value Ref Range   Sodium 126 (L) 135 - 145 mmol/L   Potassium 4.1 3.5 - 5.1 mmol/L   Chloride 100 (L) 101 - 111 mmol/L   CO2 21 (L) 22 - 32 mmol/L   Glucose, Bld 124 (H) 65 - 99 mg/dL   BUN 16 6 - 20 mg/dL   Creatinine, Ser 1.35 (H) 0.61 - 1.24 mg/dL   Calcium 8.0 (L) 8.9 - 10.3 mg/dL   GFR calc non Af Amer 47 (L) >60 mL/min   GFR calc Af Amer 54 (L) >60 mL/min    Comment: (NOTE) The eGFR has been calculated using the CKD EPI equation. This calculation has not been validated in all clinical situations. eGFR's persistently <60 mL/min signify possible Chronic Kidney Disease.    Anion  gap 5 5 - 15  CBC     Status: Abnormal   Collection Time: 04/04/16  5:41 AM  Result Value Ref Range   WBC 8.8 3.8 - 10.6 K/uL   RBC 3.58 (L) 4.40 - 5.90 MIL/uL   Hemoglobin 11.9 (L) 13.0 - 18.0 g/dL   HCT 34.4 (L) 40.0 - 52.0 %   MCV 96.2 80.0 - 100.0 fL   MCH 33.4 26.0 - 34.0 pg   MCHC 34.7 32.0 - 36.0 g/dL   RDW 13.6 11.5 - 14.5 %   Platelets 168 150 - 440 K/uL  Basic metabolic panel     Status: Abnormal   Collection Time: 04/04/16  5:41 AM  Result Value Ref Range   Sodium 127 (L) 135 - 145 mmol/L   Potassium 4.0 3.5 - 5.1 mmol/L   Chloride 99 (L) 101 - 111 mmol/L   CO2 22 22 - 32 mmol/L   Glucose, Bld 113 (H) 65 - 99 mg/dL   BUN 16 6 - 20 mg/dL   Creatinine, Ser 1.33 (H) 0.61 - 1.24 mg/dL   Calcium 8.4 (L) 8.9 - 10.3 mg/dL   GFR calc non Af Amer 48 (L) >60 mL/min   GFR calc Af Amer 55 (L) >60 mL/min    Comment: (NOTE) The eGFR has been calculated using the CKD EPI equation. This calculation has not been validated in all clinical situations. eGFR's persistently <60 mL/min signify possible Chronic Kidney Disease.    Anion gap 6 5 - 15  Glucose, capillary     Status: Abnormal   Collection Time: 04/04/16  7:31 AM  Result Value Ref Range   Glucose-Capillary 120 (H) 65 - 99 mg/dL    Dg Knee Right Port  04/02/2016  CLINICAL DATA:  82 year old male status post right total knee replacement. Postoperative film. EXAM: PORTABLE RIGHT KNEE - 1-2 VIEW COMPARISON:  No priors. FINDINGS: AP and lateral views of the right knee demonstrate postoperative changes of right total knee arthroplasty. Both the femoral and the tibial components of the prosthesis appear to be properly seated without definite periprosthetic fracture or other immediate complicating features. There is a surgical drain in the soft tissues, and anterior skin staples are noted. There is gas throughout the joint space and in the overlying soft tissues. Numerous vascular calcifications are noted. IMPRESSION: 1. Expected  postoperative findings of right total knee arthroplasty, as above, without acute complicating features. 2. Atherosclerosis. Electronically Signed   By: Vinnie Langton M.D.   On: 04/02/2016 16:00  Review of Systems  Constitutional: Negative.   HENT: Negative.   Eyes: Negative.   Respiratory: Negative.   Cardiovascular: Negative.   Gastrointestinal: Negative.   Genitourinary: Negative.   Musculoskeletal: Negative.   Skin: Negative.   Neurological: Positive for dizziness and loss of consciousness.  Endo/Heme/Allergies: Negative.   Psychiatric/Behavioral: Negative.    Blood pressure 155/77, pulse 77, temperature 97.6 F (36.4 C), temperature source Oral, resp. rate 18, height 6' (1.829 m), weight 84.5 kg (186 lb 4.6 oz), SpO2 97 %. Physical Exam  Nursing note and vitals reviewed. Constitutional: He is oriented to person, place, and time. He appears well-developed and well-nourished.  HENT:  Head: Normocephalic.  Eyes: Conjunctivae and EOM are normal. Pupils are equal, round, and reactive to light.  Neck: Normal range of motion. Neck supple.  Cardiovascular: Normal rate, regular rhythm and normal heart sounds.   Respiratory: Effort normal and breath sounds normal.  GI: Soft. Bowel sounds are normal.  Musculoskeletal: Normal range of motion.  Status post total knee replacement postop day 2  Neurological: He is alert and oriented to person, place, and time. He has normal reflexes.  Skin: Skin is warm and dry.  Psychiatric: He has a normal mood and affect.    Assessment/Plan: Bradycardia Hypertension Postop knee replacement Hyperlipidemia GERD Abdomen aortic aneurysm Coronary heart disease Coronary bypass surgery Aortic valve replacement with a tissue valve 2011 Anemia Monoclonal gammopathy Chronic renal insufficiency . PLAN Conservative medical therapy Avoid narcotic therapy Continue physical therapy Aortic valve replacement tissue valve stable continue  therapy Chronic renal insufficiency Continue hypertension therapy Coronary disease status post coronary bypass continue medical therapy DVT prophylaxis   Notnamed Scholz D. 04/04/2016, 1:48 PM

## 2016-04-04 NOTE — Care Management (Signed)
Lovenox $75- patient aware and agrees.

## 2016-04-04 NOTE — Progress Notes (Signed)
   Subjective: 2 Days Post-Op Procedure(s) (LRB): COMPUTER ASSISTED TOTAL KNEE ARTHROPLASTY (Right) Patient reports pain as mild.   Patient is well, and has had no acute complaints or problems Continue with physical therapy today.  Plan is to go Home after hospital stay. no nausea and no vomiting Patient denies any chest pains or shortness of breath. Didn't sleep well 2/2 light being on all night  Objective: Vital signs in last 24 hours: Temp:  [97.4 F (36.3 C)-98.6 F (37 C)] 97.6 F (36.4 C) (07/19 0400) Pulse Rate:  [57-77] 77 (07/19 0400) Resp:  [12-21] 18 (07/19 0400) BP: (130-155)/(59-77) 155/77 mmHg (07/19 0400) SpO2:  [92 %-98 %] 97 % (07/19 0400) well approximated incision Heels are non tender and elevated off the bed using rolled towels Intake/Output from previous day: 07/18 0701 - 07/19 0700 In: 120 [P.O.:120] Out: 1650 [Urine:1550; Drains:100] Intake/Output this shift:     Recent Labs  04/02/16 1025 04/03/16 0424 04/04/16 0541  HGB 14.6 11.8* 11.9*    Recent Labs  04/03/16 0424 04/04/16 0541  WBC 8.8 8.8  RBC 3.50* 3.58*  HCT 34.1* 34.4*  PLT 175 168    Recent Labs  04/02/16 1025 04/03/16 0424  NA 128* 126*  K 4.0 4.1  CL  --  100*  CO2  --  21*  BUN  --  16  CREATININE  --  1.35*  GLUCOSE 109* 124*  CALCIUM  --  8.0*   No results for input(s): LABPT, INR in the last 72 hours.  EXAM General - Patient is Alert, Appropriate and Oriented Extremity - Neurologically intact Neurovascular intact Sensation intact distally Intact pulses distally Dorsiflexion/Plantar flexion intact Compartment soft Dressing - scant drainage Motor Function - intact, moving foot and toes well on exam.    Past Medical History  Diagnosis Date  . Hypertension   . Hypercholesteremia   . MGUS (monoclonal gammopathy of unknown significance)   . GERD (gastroesophageal reflux disease)   . Arthritis   . Anemia   . Aortic aneurysm ([place])   . Aortic aneurysm  ([place])   . Coronary artery disease   . Diverticulosis     WITH RUPTURE  . History of hiatal hernia   . CKD (chronic kidney disease)     ALSO LEFT RENAL MASS  . MGUS (monoclonal gammopathy of unknown significance)     Assessment/Plan: 2 Days Post-Op Procedure(s) (LRB): COMPUTER ASSISTED TOTAL KNEE ARTHROPLASTY (Right) Active Problems:   S/P total knee arthroplasty  Estimated body mass index is 25.26 kg/(m^2) as calculated from the following:   Height as of this encounter: 6' (1.829 m).   Weight as of this encounter: 84.5 kg (186 lb 4.6 oz). Up with therapy Plan for discharge tomorrow Discharge home with home health  Labs: pending DVT Prophylaxis - Lovenox, Foot Pumps and TED hose Weight-Bearing as tolerated to right leg Discontinue telemetry  hemovac discontinued Needs a bowel movement today  Annette Bertelson R. [place] 04/04/2016, 7:12 AM

## 2016-04-04 NOTE — Evaluation (Signed)
Occupational Therapy Evaluation Patient Details Name: [name] MRN: [id number] DOB: 1931/09/10 Today's Date: 04/04/2016    History of Present Illness Pt. is an 82 y.o.R TKR (04/02/16). Pt. was initially transferred  to CCU after episode of sinus bradycardia and decreased responsiveness (after receiving oxycodone) requiring administration of atropine; now normalized/stable and appropriately responsive to activity.    Clinical Impression   Pt. Is a 82 y.o. male who was admitted for a Right TKR. Pt presents with limited ROM, Pain, weakness, and impaired functional mobility which hinder his ability to complete ADL and IADL tasks. Pt. could benefit from skilled OT services to review A/E use for LE ADLs, to review necessary home modifications, and to improve functional mobility for ADL/IADLs in order to work towards regaining Independence with ADL/IADLs.     Follow Up Recommendations  No OT follow up    Equipment Recommendations       Recommendations for Other Services PT consult     Precautions / Restrictions Precautions Precautions: Fall Restrictions Weight Bearing Restrictions: Yes RLE Weight Bearing: Weight bearing as tolerated         Balance Overall balance assessment: Needs assistance Sitting-balance support: Feet supported Sitting balance-Leahy Scale: Normal     Standing balance support: Bilateral upper extremity supported Standing balance-Leahy Scale: Good                              ADL Overall ADL's : Needs assistance/impaired Eating/Feeding: Set up   Grooming: Set up               Lower Body Dressing: Minimal assistance               Functional mobility during ADLs: Minimal assistance       Vision     Perception     Praxis      Pertinent Vitals/Pain Pain Assessment: 0-10 Pain Score: 2  Pain Location: Right Knee Pain Descriptors / Indicators: Operative site guarding     Hand Dominance Right   Extremity/Trunk  Assessment Upper Extremity Assessment Upper Extremity Assessment: Overall WFL for tasks assessed           Communication Communication Communication: No difficulties   Cognition Arousal/Alertness: Awake/alert Behavior During Therapy: WFL for tasks assessed/performed Overall Cognitive Status: Within Functional Limits for tasks assessed                     General Comments    Exercises   Shoulder Instructions      Home Living Family/patient expects to be discharged to:: Private residence Living Arrangements: Alone Available Help at Discharge: Family;Available PRN/intermittently;Friend(s) Type of Home: House Home Access: Level entry     Home Layout: One level     Bathroom Shower/Tub: Walk-in shower;Door   [employer] Toilet: Standard     Home Equipment: Environmental consultant - 2 wheels;Walker - 4 wheels;Cane - single point          Prior Functioning/Environment Level of Independence: Independent        Comments: Indep with all ADLs, household and community mobility, + driving; using SPC in recent 2-3 weeks due to progressive R knee pain.     OT Diagnosis: Generalized weakness;Acute pain   OT Problem List: Decreased strength;Impaired UE functional use;Decreased knowledge of use of DME or AE;Pain;Decreased activity tolerance   OT Treatment/Interventions: Self-care/ADL training;Therapeutic exercise;Therapeutic activities;Patient/family education;DME and/or AE instruction    OT Goals(Current goals can be found in  the care plan section) Acute Rehab OT Goals Patient Stated Goal: To return home OT Goal Formulation: With patient  OT Frequency: Min 1X/week   Barriers to D/C:            Co-evaluation              End of Session    Activity Tolerance: Patient tolerated treatment well Patient left: in chair;with call bell/phone within reach;with chair alarm set   Time: 1040-1103 OT Time Calculation (min): 23 min Charges:    G-Codes:    Harrel Carina, MS,  OTR/L Harrel Carina 04/04/2016, 11:57 AM

## 2016-04-04 NOTE — Progress Notes (Signed)
Physical Therapy Treatment Patient Details Name: [name] MRN: [id number] DOB: 1931/01/10 Today's Date: 04/04/2016    History of Present Illness Pt. is an 82 y.o.R TKR (04/02/16). Pt. was initially transferred  to CCU after episode of sinus bradycardia and decreased responsiveness (after receiving oxycodone) requiring administration of atropine; now normalized/stable and appropriately responsive to activity.     PT Comments    Pt in bed agreeable to participate in therapy. Denies pain at rest, performed supine therex as noted in chart.  Pt demonstrated good safety with supine to sit with HOB elevated.  Performed sit to stand with use of RW and good safety.  Ambulated 130f with RW with step through pattern with cues for increasing knee flexion, toe off and heel strike.  Upon returning to chair pt participated in seated TKA therex as noted with pt also performing self stretching PROM x5.  Pt remained in chair with all current needs met.   Follow Up Recommendations  Home health PT     Equipment Recommendations  Rolling walker with 5" wheels    Recommendations for Other Services       Precautions / Restrictions Precautions Precautions: Fall Restrictions Weight Bearing Restrictions: Yes RLE Weight Bearing: Weight bearing as tolerated    Mobility  Bed Mobility Overal bed mobility: Modified Independent             General bed mobility comments: supine to sit with good safety/technique  Transfers Overall transfer level: Needs assistance Equipment used: Rolling walker (2 wheeled) Transfers: Sit to/from Stand Sit to Stand: Min guard            Ambulation/Gait Ambulation/Gait assistance: Min guard Ambulation Distance (Feet): 100 Feet Assistive device: Rolling walker (2 wheeled) Gait Pattern/deviations: Step-through pattern;Decreased step length - right;Decreased stance time - right;Decreased weight shift to right     General Gait Details: Cues for increase knee  flexion with gait    Stairs            Wheelchair Mobility    Modified Rankin (Stroke Patients Only)       Balance Overall balance assessment: Needs assistance Sitting-balance support: Feet supported Sitting balance-Leahy Scale: Normal     Standing balance support: Bilateral upper extremity supported Standing balance-Leahy Scale: Good                      Cognition Arousal/Alertness: Awake/alert Behavior During Therapy: WFL for tasks assessed/performed Overall Cognitive Status: Within Functional Limits for tasks assessed                      Exercises Total Joint Exercises Ankle Circles/Pumps: AROM;Both;10 reps;Supine Quad Sets: AROM;Both;10 reps;Supine Short Arc Quad: AROM;Strengthening;Right;10 reps;Supine Straight Leg Raises: Strengthening;Right;10 reps;Supine Long Arc Quad: AROM;Right;10 reps;Seated Knee Flexion: PROM;AAROM;Right;5 reps;Seated    General Comments        Pertinent Vitals/Pain Pain Assessment: 0-10 Pain Score: 2  Pain Location: R knee Pain Descriptors / Indicators: Operative site guarding    Home Living Family/patient expects to be discharged to:: Private residence Living Arrangements: Alone Available Help at Discharge: Family;Available PRN/intermittently;Friend(s) Type of Home: House Home Access: Level entry   Home Layout: One level Home Equipment: Walker - 2 wheels;Walker - 4 wheels;Cane - single point      Prior Function Level of Independence: Independent      Comments: Indep with all ADLs, household and community mobility, + driving; using SPC in recent 2-3 weeks due to progressive R knee pain.  PT Goals (current goals can now be found in the care plan section) Progress towards PT goals: Progressing toward goals    Frequency  BID    PT Plan Current plan remains appropriate    Co-evaluation             End of Session Equipment Utilized During Treatment: Gait belt Activity Tolerance: Patient  tolerated treatment well Patient left: in chair;with call bell/phone within reach;with chair alarm set     Time: 0902-0930 PT Time Calculation (min) (ACUTE ONLY): 28 min  Charges:  $Gait Training: 8-22 mins $Therapeutic Exercise: 8-22 mins                    G Codes:      Limuel Nieblas 04/22/2016, 11:53 AM  Taffany Heiser, PTA

## 2016-04-05 MED ORDER — ENOXAPARIN SODIUM 40 MG/0.4ML ~~LOC~~ SOLN: 40.0000 mg | SUBCUTANEOUS | Status: DC

## 2016-04-05 MED ORDER — TRAMADOL HCL 50 MG PO TABS: 50.0000 mg | ORAL_TABLET | ORAL | Status: DC | PRN

## 2016-04-05 NOTE — Progress Notes (Signed)
Pt discharged home via w/c. Has scripts for discharge  And [facility]

## 2016-04-05 NOTE — Progress Notes (Signed)
   Subjective: 3 Days Post-Op Procedure(s) (LRB): COMPUTER ASSISTED TOTAL KNEE ARTHROPLASTY (Right) Patient reports pain as mild.   Patient is well, and has had no acute complaints or problems Continue with physical therapy today.  Plan is to go Home after hospital stay. no nausea and no vomiting Patient denies any chest pains or shortness of breath. Patient states he slept much better last night. No complaints this morning.  Objective: Vital signs in last 24 hours: Temp:  [97.6 F (36.4 C)-98.6 F (37 C)] 97.6 F (36.4 C) (07/20 0349) Pulse Rate:  [77-89] 77 (07/20 0349) Resp:  [16-18] 18 (07/20 0349) BP: (141-153)/(69-88) 151/72 mmHg (07/20 0349) SpO2:  [95 %-99 %] 97 % (07/20 0349) well approximated incision Heels are non tender and elevated off the bed using rolled towels Intake/Output from previous day: 07/19 0701 - 07/20 0700 In: 600 [P.O.:600] Out: -  Intake/Output this shift:     Recent Labs  04/02/16 1025 04/03/16 0424 04/04/16 0541  HGB 14.6 11.8* 11.9*    Recent Labs  04/03/16 0424 04/04/16 0541  WBC 8.8 8.8  RBC 3.50* 3.58*  HCT 34.1* 34.4*  PLT 175 168    Recent Labs  04/03/16 0424 04/04/16 0541  NA 126* 127*  K 4.1 4.0  CL 100* 99*  CO2 21* 22  BUN 16 16  CREATININE 1.35* 1.33*  GLUCOSE 124* 113*  CALCIUM 8.0* 8.4*   No results for input(s): LABPT, INR in the last 72 hours.  EXAM General - Patient is Alert, Appropriate and Oriented Extremity - Neurologically intact Neurovascular intact Sensation intact distally Intact pulses distally Dorsiflexion/Plantar flexion intact Compartment soft Dressing - scant drainage Motor Function - intact, moving foot and toes well on exam.    Past Medical History  Diagnosis Date  . Hypertension   . Hypercholesteremia   . MGUS (monoclonal gammopathy of unknown significance)   . GERD (gastroesophageal reflux disease)   . Arthritis   . Anemia   . Aortic aneurysm ([place])   . Aortic aneurysm  ([place])   . Coronary artery disease   . Diverticulosis     WITH RUPTURE  . History of hiatal hernia   . CKD (chronic kidney disease)     ALSO LEFT RENAL MASS  . MGUS (monoclonal gammopathy of unknown significance)     Assessment/Plan: 3 Days Post-Op Procedure(s) (LRB): COMPUTER ASSISTED TOTAL KNEE ARTHROPLASTY (Right) Active Problems:   S/P total knee arthroplasty  Estimated body mass index is 25.26 kg/(m^2) as calculated from the following:   Height as of this encounter: 6' (1.829 m).   Weight as of this encounter: 84.5 kg (186 lb 4.6 oz). Up with therapy Discharge home with home health today after patient does physical therapy and has a bowel movement  Labs: none DVT Prophylaxis - Lovenox, Foot Pumps and TED hose Weight-Bearing as tolerated to right leg Patient needs a bowel movement today Dressing changes on today's visit Patient may be discharged after he does physical therapy and has a bowel movement  Cindy Fullman R. Uintah Carney 04/05/2016, 6:49 AM

## 2016-04-05 NOTE — Discharge Instructions (Signed)

## 2016-04-05 NOTE — Progress Notes (Signed)
Physical Therapy Treatment Patient Details Name: James ABRAHA MRN: [id number] DOB: April 26, 1931 Today's Date: 04/05/2016    History of Present Illness Pt. is an 82 y.o.R TKR (04/02/16). Pt. was initially transferred  to CCU after episode of sinus bradycardia and decreased responsiveness (after receiving oxycodone) requiring administration of atropine; now normalized/stable and appropriately responsive to activity.     PT Comments    Pt in bed agreeable for therapy.  Improved supine to sit transfer with only use of bed rail.  Pt ambulated 225f, returned to room and reviewed HEP performing therex x 10-15 reps each with good demonstration and understanding of HEP.  After therex pt requesting another bout of ambulation where pt demonstrated improved knee flexion and heel strike with gait.  Pt left in recliner with all needs met.   Follow Up Recommendations  Home health PT     Equipment Recommendations  Rolling walker with 5" wheels    Recommendations for Other Services       Precautions / Restrictions Precautions Precautions: Fall Restrictions Weight Bearing Restrictions: Yes RLE Weight Bearing: Weight bearing as tolerated    Mobility  Bed Mobility Overal bed mobility: Modified Independent             General bed mobility comments: add'l time required and use of features  Transfers Overall transfer level: Needs assistance Equipment used: Rolling walker (2 wheeled) Transfers: Sit to/from Stand Sit to Stand: Supervision         General transfer comment: cues for increasing knne flexion with sit to stand transfers   Ambulation/Gait Ambulation/Gait assistance: Min guard Ambulation Distance (Feet): 400 Feet (seated rest at 200 ft ) Assistive device: Rolling walker (2 wheeled) Gait Pattern/deviations: Step-through pattern;Decreased step length - left;Decreased stance time - left     General Gait Details: improved knee flexion and heel strike with second round of gait     Stairs            Wheelchair Mobility    Modified Rankin (Stroke Patients Only)       Balance Overall balance assessment: Needs assistance Sitting-balance support: Feet supported Sitting balance-Leahy Scale: Normal     Standing balance support: Bilateral upper extremity supported Standing balance-Leahy Scale: Good                      Cognition Arousal/Alertness: Awake/alert Behavior During Therapy: WFL for tasks assessed/performed Overall Cognitive Status: Within Functional Limits for tasks assessed                      Exercises Total Joint Exercises Ankle Circles/Pumps: AROM;10 reps;Seated Quad Sets: Strengthening;Both;10 reps;Seated Short Arc Quad: Strengthening;Right;10 reps;Seated Hip ABduction/ADduction: Strengthening;Right;10 reps;Seated Straight Leg Raises: Strengthening;Right;10 reps;Seated Long Arc Quad: AROM;Strengthening;Right;10 reps;Seated Knee Flexion: PROM;Right;5 reps    General Comments        Pertinent Vitals/Pain Pain Assessment: 0-10 Pain Score: 3  Pain Location: R knee Pain Descriptors / Indicators: Aching;Operative site guarding    Home Living                      Prior Function            PT Goals (current goals can now be found in the care plan section)      Frequency  BID    PT Plan Current plan remains appropriate    Co-evaluation             End of Session Equipment Utilized  During Treatment: Gait belt Activity Tolerance: Patient tolerated treatment well Patient left: in chair;with call bell/phone within reach;with chair alarm set     Time: 0930-1004 PT Time Calculation (min) (ACUTE ONLY): 34 min  Charges:  $Gait Training: 8-22 mins $Therapeutic Exercise: 8-22 mins                    G Codes:      Telitha Plath 05/05/2016, 11:33 AM Terren Jandreau, PTA

## 2016-04-05 NOTE — Addendum Note (Signed)
Addendum  created 04/05/16 0820 by Gunnar Bulla, MD   Modules edited: Anesthesia Events, Narrator   Narrator:  Narrator: Event Log Edited

## 2016-04-05 NOTE — Discharge Summary (Signed)
Physician Discharge Summary  Patient ID: [name] MRN: [id number] DOB/AGE: April 09, 1931 82 y.o.  Admit date: 04/02/2016 Discharge date: 04/05/16  Admission Diagnoses:  PRIMARY OSTEOARTHRITIS    Discharge Diagnoses: Patient Active Problem List   Diagnosis Date Noted  . S/P total knee arthroplasty 04/02/2016  . Chronic kidney disease 11/22/2015  . CAD in native artery 11/22/2015  . CKD (chronic kidney disease) stage 3, GFR 30-59 ml/min 06/30/2015  . TI (tricuspid incompetence) 05/11/2015  . Early satiety 01/25/2015  . Benign essential HTN 01/03/2015  . Abdominal aortic aneurysm (AAA) without rupture ([place]) 07/09/2014  . MI (mitral incompetence) 07/09/2014  . Aortic heart valve narrowing 07/09/2014  . Cardiomyopathy, ischemic 06/28/2014  . Arthritis of knee, degenerative 05/17/2014  . Kidney lump 04/09/2013  . Benign prostatic hyperplasia with urinary obstruction 03/17/2013  . Chronic kidney disease, stage IV (severe) ([place]) 03/17/2013  . 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 Hypertension   . Hypercholesteremia   . MGUS (monoclonal gammopathy of unknown significance)   . GERD (gastroesophageal reflux disease)   . Arthritis   . Anemia   . Aortic aneurysm ([place])   . Aortic aneurysm ([place])   . Coronary artery disease   . Diverticulosis     WITH RUPTURE  . History of hiatal hernia   . CKD (chronic kidney disease)     ALSO LEFT RENAL MASS  . MGUS (monoclonal gammopathy of unknown significance)      Transfusion: Autovac transfusion given the first 6 hours postoperatively.   Consultants (if any): Treatment Team:  Corey Skains, MD Yolonda Kida, MD  Dr. Vianne Bulls Case management for home health assistance  Discharged Condition: Improved  Hospital Course: [name] is an 82 y.o. male who was admitted 04/02/2016 with a diagnosis of degenerative arthrosis right knee and went to the operating  room on 04/02/2016 and underwent the above named procedures.   On day of surgery patient was noted to have episodes of bradycardia which was felt to be secondary to oxycodone according to the internists. Subsequently a rapid response team was initiated. He was then taken to ICU where he was given fluid, discontinued narcotics and blood pressure return to normal. He has had no other episodes of bradycardia while in the hospital. Patient is denies any chest pains or shortness of breath. Patient return to the orthopedic floor day 1 following surgery. Along the internists his cardiologist was contacted. There is no evidence of any cardiac issues.   Surgeries:Procedure(s): COMPUTER ASSISTED TOTAL KNEE ARTHROPLASTY on 04/02/2016  PRE-OPERATIVE DIAGNOSIS: Degenerative arthrosis of the right knee, primary  POST-OPERATIVE DIAGNOSIS: Same  PROCEDURE: Right total knee arthroplasty using computer-assisted navigation  SURGEON: Marciano Sequin. M.D.  ASSISTANT: Vance Peper, PA (present and scrubbed throughout the case, critical for assistance with exposure, retraction, instrumentation, and closure)  ANESTHESIA: spinal  ESTIMATED BLOOD LOSS: 100 mL  FLUIDS REPLACED: 1200 mL of crystalloid  TOURNIQUET TIME: 108 minutes  DRAINS: 2 medium drains to a reinfusion system  SOFT TISSUE RELEASES: Anterior cruciate ligament, posterior cruciate ligament, deep and superficial medial collateral ligament, patellofemoral ligament   IMPLANTS UTILIZED: DePuy PFC Sigma size 5 posterior stabilized femoral component (cemented), size 5 MBT tibial component (cemented), 41 mm 3 peg oval dome patella (cemented), and a 15 mm stabilized rotating platform polyethylene insert.  INDICATIONS FOR SURGERY: [name] is a 82 y.o. year old male with a long history  of progressive knee pain. X-rays demonstrated severe degenerative changes in tricompartmental fashion. The patient had not seen any significant improvement despite  conservative nonsurgical intervention. After discussion of the risks and benefits of surgical intervention, the patient expressed understanding of the risks benefits and agree with plans for total knee arthroplasty.   The risks, benefits, and alternatives were discussed at length including but not limited to the risks of infection, bleeding, nerve injury, stiffness, blood clots, the need for revision surgery, cardiopulmonary complications, among others, and they were willing to proceed. Patient tolerated the surgery well. No complications .Patient was taken to PACU where she was stabilized and then transferred to the orthopedic floor.  Patient started on Lovenox 30 q 12 hrs. Foot pumps applied bilaterally at 80 mm hgb. Heels elevated off bed with rolled towels. No evidence of DVT. Calves non tender. Negative Homan. Physical therapy started on day #1 for gait training and transfer with OT starting on  day #1 for ADL and assisted devices. Patient has done well with therapy. Ambulated greater than 200 feet upon being discharged.  Patient's IV and Foley were discontinued on day #1 with Hemovac being discontinued on day #2. Dressing was changed on day #3 prior to being discharged.   He was given perioperative antibiotics:  Anti-infectives    Start     Dose/Rate Route Frequency Ordered Stop   04/02/16 1640  ceFAZolin (ANCEF) IVPB 2g/100 mL premix     2 g 200 mL/hr over 30 Minutes Intravenous Every 6 hours 04/02/16 1640 04/03/16 1054   04/02/16 0937  ceFAZolin (ANCEF) 2-4 GM/100ML-% IVPB    Comments:  KENNEDY, ASHLEY: cabinet override      04/02/16 0937 04/02/16 2144   04/02/16 0317  ceFAZolin (ANCEF) IVPB 2g/100 mL premix     2 g 200 mL/hr over 30 Minutes Intravenous On call to O.R. 04/02/16 [id number] 04/02/16 1200    .  He was itted with AV 1 compression foot pump devices, instructed on heel pumps, early ambulation, and fitted with TED stockings bilaterally for DVT prophylaxis.  He benefited  maximally from the hospital stay and there were no complications.    Recent vital signs:  Filed Vitals:   04/04/16 1941 04/05/16 0349  BP: 141/69 151/72  Pulse: 89 77  Temp: 98.6 F (37 C) 97.6 F (36.4 C)  Resp: 18 18    Recent laboratory studies:  Lab Results  Component Value Date   HGB 11.9* 04/04/2016   HGB 11.8* 04/03/2016   HGB 14.6 04/02/2016   Lab Results  Component Value Date   WBC 8.8 04/04/2016   PLT 168 04/04/2016   Lab Results  Component Value Date   INR 1.08 03/21/2016   Lab Results  Component Value Date   NA 127* 04/04/2016   K 4.0 04/04/2016   CL 99* 04/04/2016   CO2 22 04/04/2016   BUN 16 04/04/2016   CREATININE 1.33* 04/04/2016   GLUCOSE 113* 04/04/2016    Discharge Medications:     Medication List    STOP taking these medications        aspirin 81 MG tablet      TAKE these medications        amLODipine 5 MG tablet  Commonly known as:  NORVASC  Take 5 mg by mouth daily.     CULTURELLE DIGESTIVE HEALTH PO  Take 1 tablet by mouth daily.     enalapril 10 MG tablet  Commonly known as:  VASOTEC  Take 20 mg  by mouth 2 (two) times daily.     enoxaparin 40 MG/0.4ML injection  Commonly known as:  LOVENOX  Inject 0.4 mLs (40 mg total) into the skin daily.     hydrALAZINE 25 MG tablet  Commonly known as:  APRESOLINE  Take 25 mg by mouth 2 (two) times daily.     Melatonin 3 MG Tabs  Take 1.5 mg by mouth at bedtime as needed (sleep).     pantoprazole 40 MG tablet  Commonly known as:  PROTONIX  Take 1 tablet by mouth daily.     pravastatin 20 MG tablet  Commonly known as:  PRAVACHOL  Take 20 mg by mouth every evening.     sodium bicarbonate 650 MG tablet  Take 1,300 mg by mouth 2 (two) times daily.     traMADol 50 MG tablet  Commonly known as:  ULTRAM  Take 1-2 tablets (50-100 mg total) by mouth every 4 (four) hours as needed for moderate pain.        Diagnostic Studies: Dg Knee Right Port  04/02/2016  CLINICAL DATA:   82 year old male status post right total knee replacement. Postoperative film. EXAM: PORTABLE RIGHT KNEE - 1-2 VIEW COMPARISON:  No priors. FINDINGS: AP and lateral views of the right knee demonstrate postoperative changes of right total knee arthroplasty. Both the femoral and the tibial components of the prosthesis appear to be properly seated without definite periprosthetic fracture or other immediate complicating features. There is a surgical drain in the soft tissues, and anterior skin staples are noted. There is gas throughout the joint space and in the overlying soft tissues. Numerous vascular calcifications are noted. IMPRESSION: 1. Expected postoperative findings of right total knee arthroplasty, as above, without acute complicating features. 2. Atherosclerosis. Electronically Signed   By: Vinnie Langton M.D.   On: 04/02/2016 16:00    Disposition: 01-Home or Self Care      Discharge Instructions    Diet - low sodium heart healthy    Complete by:  As directed      Increase activity slowly    Complete by:  As directed            Follow-up Information    Follow up with [facility] R., PA On 04/17/2016.   Specialty:  Physician Assistant   Why:  at 10:00am   Contact information:   [address] [place] [address] [phone]       Follow up with Dereck Leep, MD On 05/15/2016.   Specialty:  Orthopedic Surgery   Why:  at 2:15pm   Contact information:   [place] [address] [phone]        Signed: Watt Climes 04/05/2016, 6:55 AM

## 2016-04-05 NOTE — Care Management (Signed)
Spoke with patient; ready for discharge per patient. [facility] notified and given patient cell number [phone]. No further RNCM needs. Case closed.

## 2016-04-06 DIAGNOSIS — N183 Chronic kidney disease, stage 3 (moderate): Secondary | ICD-10-CM | POA: Diagnosis not present

## 2016-04-06 DIAGNOSIS — E785 Hyperlipidemia, unspecified: Secondary | ICD-10-CM | POA: Diagnosis not present

## 2016-04-06 DIAGNOSIS — I129 Hypertensive chronic kidney disease with stage 1 through stage 4 chronic kidney disease, or unspecified chronic kidney disease: Secondary | ICD-10-CM | POA: Diagnosis not present

## 2016-04-06 DIAGNOSIS — I2510 Atherosclerotic heart disease of native coronary artery without angina pectoris: Secondary | ICD-10-CM | POA: Diagnosis not present

## 2016-04-06 DIAGNOSIS — Z471 Aftercare following joint replacement surgery: Secondary | ICD-10-CM | POA: Diagnosis not present

## 2016-04-06 DIAGNOSIS — Z87891 Personal history of nicotine dependence: Secondary | ICD-10-CM | POA: Diagnosis not present

## 2016-04-06 DIAGNOSIS — Z96653 Presence of artificial knee joint, bilateral: Secondary | ICD-10-CM | POA: Diagnosis not present

## 2016-04-06 DIAGNOSIS — Z951 Presence of aortocoronary bypass graft: Secondary | ICD-10-CM | POA: Diagnosis not present

## 2016-04-06 DIAGNOSIS — Z79891 Long term (current) use of opiate analgesic: Secondary | ICD-10-CM | POA: Diagnosis not present

## 2016-04-06 DIAGNOSIS — Z7901 Long term (current) use of anticoagulants: Secondary | ICD-10-CM | POA: Diagnosis not present

## 2016-04-06 DIAGNOSIS — Z953 Presence of xenogenic heart valve: Secondary | ICD-10-CM | POA: Diagnosis not present

## 2016-04-09 DIAGNOSIS — Z7901 Long term (current) use of anticoagulants: Secondary | ICD-10-CM | POA: Diagnosis not present

## 2016-04-09 DIAGNOSIS — I129 Hypertensive chronic kidney disease with stage 1 through stage 4 chronic kidney disease, or unspecified chronic kidney disease: Secondary | ICD-10-CM | POA: Diagnosis not present

## 2016-04-09 DIAGNOSIS — Z471 Aftercare following joint replacement surgery: Secondary | ICD-10-CM | POA: Diagnosis not present

## 2016-04-09 DIAGNOSIS — Z87891 Personal history of nicotine dependence: Secondary | ICD-10-CM | POA: Diagnosis not present

## 2016-04-09 DIAGNOSIS — Z96653 Presence of artificial knee joint, bilateral: Secondary | ICD-10-CM | POA: Diagnosis not present

## 2016-04-09 DIAGNOSIS — N183 Chronic kidney disease, stage 3 (moderate): Secondary | ICD-10-CM | POA: Diagnosis not present

## 2016-04-09 DIAGNOSIS — Z951 Presence of aortocoronary bypass graft: Secondary | ICD-10-CM | POA: Diagnosis not present

## 2016-04-09 DIAGNOSIS — E785 Hyperlipidemia, unspecified: Secondary | ICD-10-CM | POA: Diagnosis not present

## 2016-04-09 DIAGNOSIS — Z79891 Long term (current) use of opiate analgesic: Secondary | ICD-10-CM | POA: Diagnosis not present

## 2016-04-09 DIAGNOSIS — I2510 Atherosclerotic heart disease of native coronary artery without angina pectoris: Secondary | ICD-10-CM | POA: Diagnosis not present

## 2016-04-09 DIAGNOSIS — Z953 Presence of xenogenic heart valve: Secondary | ICD-10-CM | POA: Diagnosis not present

## 2016-04-12 DIAGNOSIS — Z953 Presence of xenogenic heart valve: Secondary | ICD-10-CM | POA: Diagnosis not present

## 2016-04-12 DIAGNOSIS — Z7901 Long term (current) use of anticoagulants: Secondary | ICD-10-CM | POA: Diagnosis not present

## 2016-04-12 DIAGNOSIS — Z87891 Personal history of nicotine dependence: Secondary | ICD-10-CM | POA: Diagnosis not present

## 2016-04-12 DIAGNOSIS — Z951 Presence of aortocoronary bypass graft: Secondary | ICD-10-CM | POA: Diagnosis not present

## 2016-04-12 DIAGNOSIS — I340 Nonrheumatic mitral (valve) insufficiency: Secondary | ICD-10-CM | POA: Diagnosis not present

## 2016-04-12 DIAGNOSIS — Z79891 Long term (current) use of opiate analgesic: Secondary | ICD-10-CM | POA: Diagnosis not present

## 2016-04-12 DIAGNOSIS — I952 Hypotension due to drugs: Secondary | ICD-10-CM | POA: Diagnosis not present

## 2016-04-12 DIAGNOSIS — Z471 Aftercare following joint replacement surgery: Secondary | ICD-10-CM | POA: Diagnosis not present

## 2016-04-12 DIAGNOSIS — Z96653 Presence of artificial knee joint, bilateral: Secondary | ICD-10-CM | POA: Diagnosis not present

## 2016-04-12 DIAGNOSIS — N183 Chronic kidney disease, stage 3 (moderate): Secondary | ICD-10-CM | POA: Diagnosis not present

## 2016-04-12 DIAGNOSIS — I129 Hypertensive chronic kidney disease with stage 1 through stage 4 chronic kidney disease, or unspecified chronic kidney disease: Secondary | ICD-10-CM | POA: Diagnosis not present

## 2016-04-12 DIAGNOSIS — E785 Hyperlipidemia, unspecified: Secondary | ICD-10-CM | POA: Diagnosis not present

## 2016-04-12 DIAGNOSIS — I255 Ischemic cardiomyopathy: Secondary | ICD-10-CM | POA: Diagnosis not present

## 2016-04-12 DIAGNOSIS — E782 Mixed hyperlipidemia: Secondary | ICD-10-CM | POA: Diagnosis not present

## 2016-04-12 DIAGNOSIS — I2510 Atherosclerotic heart disease of native coronary artery without angina pectoris: Secondary | ICD-10-CM | POA: Diagnosis not present

## 2016-04-16 ENCOUNTER — Emergency Department
Admission: EM | Admit: 2016-04-16 | Discharge: 2016-04-16 | Disposition: A | Discharge status: Home / Self Care | Payer: PPO | Attending: Emergency Medicine | Admitting: Emergency Medicine

## 2016-04-16 ENCOUNTER — Emergency Department: Payer: PPO

## 2016-04-16 DIAGNOSIS — N189 Chronic kidney disease, unspecified: Secondary | ICD-10-CM | POA: Diagnosis not present

## 2016-04-16 DIAGNOSIS — Z951 Presence of aortocoronary bypass graft: Secondary | ICD-10-CM | POA: Diagnosis not present

## 2016-04-16 DIAGNOSIS — Z7901 Long term (current) use of anticoagulants: Secondary | ICD-10-CM | POA: Diagnosis not present

## 2016-04-16 DIAGNOSIS — I129 Hypertensive chronic kidney disease with stage 1 through stage 4 chronic kidney disease, or unspecified chronic kidney disease: Secondary | ICD-10-CM | POA: Diagnosis not present

## 2016-04-16 DIAGNOSIS — Z87891 Personal history of nicotine dependence: Secondary | ICD-10-CM | POA: Diagnosis not present

## 2016-04-16 DIAGNOSIS — N183 Chronic kidney disease, stage 3 (moderate): Secondary | ICD-10-CM | POA: Diagnosis not present

## 2016-04-16 DIAGNOSIS — Z96653 Presence of artificial knee joint, bilateral: Secondary | ICD-10-CM | POA: Diagnosis not present

## 2016-04-16 DIAGNOSIS — E785 Hyperlipidemia, unspecified: Secondary | ICD-10-CM | POA: Diagnosis not present

## 2016-04-16 DIAGNOSIS — Z471 Aftercare following joint replacement surgery: Secondary | ICD-10-CM | POA: Diagnosis not present

## 2016-04-16 DIAGNOSIS — Z953 Presence of xenogenic heart valve: Secondary | ICD-10-CM | POA: Diagnosis not present

## 2016-04-16 DIAGNOSIS — I2510 Atherosclerotic heart disease of native coronary artery without angina pectoris: Secondary | ICD-10-CM | POA: Diagnosis not present

## 2016-04-16 DIAGNOSIS — Z79891 Long term (current) use of opiate analgesic: Secondary | ICD-10-CM | POA: Diagnosis not present

## 2016-04-16 DIAGNOSIS — M79604 Pain in right leg: Secondary | ICD-10-CM | POA: Diagnosis not present

## 2016-04-16 DIAGNOSIS — R21 Rash and other nonspecific skin eruption: Secondary | ICD-10-CM | POA: Diagnosis not present

## 2016-04-16 DIAGNOSIS — R2241 Localized swelling, mass and lump, right lower limb: Secondary | ICD-10-CM | POA: Diagnosis not present

## 2016-04-16 DIAGNOSIS — M7989 Other specified soft tissue disorders: Secondary | ICD-10-CM

## 2016-04-16 NOTE — ED Provider Notes (Signed)
[facility] Emergency Department Provider Note    ____________________________________________   I have reviewed the triage vital signs and the nursing notes.   HISTORY  Chief Complaint Leg Pain   History limited by: Not Limited   HPI [name] is a 82 y.o. male who presents to the emergency department today at the request of physical therapist because of concerns for swelling behind his right knee in the setting of a recent total knee replacement. Patient had his total knee done roughly 2 weeks ago. He states however he is always had swelling behind his knee. He has been told by his orthopedic doctors that he has a Baker's cyst. He states that the swelling has not increased. He denies any redness or pain to that area. He states he feels like he is having normal postop pain. He denies any fevers.   Past Medical History:  Diagnosis Date  . Anemia   . Aortic aneurysm ([place])   . Aortic aneurysm ([place])   . Arthritis   . CKD (chronic kidney disease)    ALSO LEFT RENAL MASS  . Coronary artery disease   . Diverticulosis    WITH RUPTURE  . GERD (gastroesophageal reflux disease)   . History of hiatal hernia   . Hypercholesteremia   . Hypertension   . MGUS (monoclonal gammopathy of unknown significance)   . MGUS (monoclonal gammopathy of unknown significance)     Patient Active Problem List   Diagnosis Date Noted  . S/P total knee arthroplasty 04/02/2016  . Chronic kidney disease 11/22/2015  . CAD in native artery 11/22/2015  . CKD (chronic kidney disease) stage 3, GFR 30-59 ml/min 06/30/2015  . TI (tricuspid incompetence) 05/11/2015  . Early satiety 01/25/2015  . Benign essential HTN 01/03/2015  . Abdominal aortic aneurysm (AAA) without rupture ([place]) 07/09/2014  . MI (mitral incompetence) 07/09/2014  . Aortic heart valve narrowing 07/09/2014  . Cardiomyopathy, ischemic 06/28/2014  . Arthritis of knee, degenerative 05/17/2014  . Kidney lump  04/09/2013  . Benign prostatic hyperplasia with urinary obstruction 03/17/2013  . Chronic kidney disease, stage IV (severe) ([place]) 03/17/2013  . 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 AORTIC VALVE REPLACEMENT    . bipass    . CARDIAC VALVE REPLACEMENT     pig valve  . CHOLECYSTECTOMY    . COLON SURGERY    . CORONARY ANGIOPLASTY    . CORONARY ARTERY BYPASS GRAFT    . ESOPHAGOGASTRODUODENOSCOPY (EGD) WITH PROPOFOL N/A 02/07/2015   Procedure: ESOPHAGOGASTRODUODENOSCOPY (EGD) WITH PROPOFOL;  Surgeon: Hulen Luster, MD;  Location: [facility] ENDOSCOPY;  Service: Gastroenterology;  Laterality: N/A;  . EYE SURGERY    . HERNIA REPAIR    . JOINT REPLACEMENT     left knee x4  . KNEE ARTHROPLASTY Right 04/02/2016   Procedure: COMPUTER ASSISTED TOTAL KNEE ARTHROPLASTY;  Surgeon: Dereck Leep, MD;  Location: [facility] ORS;  Service: Orthopedics;  Laterality: Right;    Prior to Admission medications   Medication Sig Start Date End Date Taking? Authorizing Provider  amLODipine (NORVASC) 5 MG tablet Take 5 mg by mouth daily.    Historical Provider, MD  enalapril (VASOTEC) 10 MG tablet Take 20 mg by mouth 2 (two) times daily.     Historical Provider, MD  enoxaparin (LOVENOX) 40 MG/0.4ML injection Inject 0.4 mLs (40 mg total) into the skin daily. 04/05/16   Watt Climes, PA  hydrALAZINE (  APRESOLINE) 25 MG tablet Take 25 mg by mouth 2 (two) times daily.  11/08/15   Historical Provider, MD  Lactobacillus-Inulin ([place] PO) Take 1 tablet by mouth daily.    Historical Provider, MD  Melatonin 3 MG TABS Take 1.5 mg by mouth at bedtime as needed (sleep).    Historical Provider, MD  pantoprazole (PROTONIX) 40 MG tablet Take 1 tablet by mouth daily. 02/29/16   Historical Provider, MD  pravastatin (PRAVACHOL) 20 MG tablet Take 20 mg by mouth every evening.    Historical Provider, MD  sodium bicarbonate 650 MG tablet Take 1,300 mg by  mouth 2 (two) times daily.    Historical Provider, MD  traMADol (ULTRAM) 50 MG tablet Take 1-2 tablets (50-100 mg total) by mouth every 4 (four) hours as needed for moderate pain. 04/05/16   Watt Climes, PA    Allergies Morphine; Carvedilol; and Iodinated diagnostic agents  Family History  Problem Relation Age of Onset  . Bladder Cancer Neg Hx   . Prostate cancer Neg Hx   . Kidney cancer Neg Hx     Social History Social History  Substance Use Topics  . Smoking status: Former Research scientist (life sciences)  . Smokeless tobacco: Never Used  . Alcohol use No    Review of Systems  Constitutional: Negative for fever. Cardiovascular: Negative for chest pain. Respiratory: Negative for shortness of breath. Gastrointestinal: Positive for abdominal pain Neurological: Negative for headaches, focal weakness or numbness.   10-point ROS otherwise negative.  ____________________________________________   PHYSICAL EXAM:  VITAL SIGNS: ED Triage Vitals  Enc Vitals Group     BP 04/16/16 1512 129/69     Pulse Rate 04/16/16 1512 75     Resp 04/16/16 1512 18     Temp 04/16/16 1512 97.9 F (36.6 C)     Temp Source 04/16/16 1512 Oral     SpO2 04/16/16 1512 98 %     Weight 04/16/16 1512 169 lb (76.7 kg)     Height 04/16/16 1512 5\' 11"  (1.803 m)     Head Circumference --      Peak Flow --      Pain Score 04/16/16 1519 0   Constitutional: Alert and oriented. Well appearing and in no distress. Eyes: Conjunctivae are normal. PERRL. Normal extraocular movements. ENT   Head: Normocephalic and atraumatic.   Nose: No congestion/rhinnorhea.   Mouth/Throat: Mucous membranes are moist.   Neck: No stridor. Hematological/Lymphatic/Immunilogical: No cervical lymphadenopathy. Cardiovascular: Normal rate, regular rhythm.  No murmurs, rubs, or gallops. Respiratory: Normal respiratory effort without tachypnea nor retractions. Breath sounds are clear and equal bilaterally. No  wheezes/rales/rhonchi. Gastrointestinal: Soft and nontender. No distention.  Genitourinary: Deferred Musculoskeletal: Bandage in place over surgical incision for the total knee. No surrounding erythema. Does have a small amount of nontender swelling behind his right knee. This is the area that he says has not changed. Neurologic:  Normal speech and language. No gross focal neurologic deficits are appreciated.  Skin:  Skin is warm, dry and intact. No rash noted. Psychiatric: Mood and affect are normal. Speech and behavior are normal. Patient exhibits appropriate insight and judgment.  ____________________________________________    LABS (pertinent positives/negatives)  None  ____________________________________________   EKG  None  ____________________________________________    RADIOLOGY  Right leg US IMPRESSION: No evidence of right lower extremity DVT  Large soft tissue mass in the right popliteal region with some internal vascularity. Soft tissue malignancy is not excluded. MRI may be helpful.  ____________________________________________  PROCEDURES  Procedures  ____________________________________________   INITIAL IMPRESSION / ASSESSMENT AND PLAN / ED COURSE  Pertinent labs & imaging results that were available during my care of the patient were reviewed by me and considered in my medical decision making (see chart for details).  Patient presented to the emergency department today because of some concern for possible blood clot after recent surgery. Ultrasound did not show a blood clot. It did however recommend a follow-up MRI. I discussed this with the patient. Given that the swelling has not changed and is been present for a long time and did not feel an emergent MRI is warranted. The patient does have an appointment tomorrow morning at the orthopedic clinic. He will discuss MRI at that time. This point I do not think that there is any blood clot or  infection. ____________________________________________   FINAL CLINICAL IMPRESSION(S) / ED DIAGNOSES  Final diagnoses:  Right leg swelling     Note: This dictation was prepared with Dragon dictation. Any transcriptional errors that result from this process are unintentional    Nance Pear, MD 04/16/16 [phone]

## 2016-04-16 NOTE — ED Triage Notes (Signed)
Pt arrives to ER via POV c/o knot and redness to back of right knee. Pt had knee replacement on 04/02/16. Pt c/o burning behind right knee. Pt concerned with possible blood clot. Pt is taking Lovenox at home. Pt alert and oriented X4, active, cooperative, pt in NAD. RR even and unlabored, color WNL.

## 2016-04-16 NOTE — Discharge Instructions (Signed)
Please seek medical attention for any high fevers, chest pain, shortness of breath, change in behavior, persistent vomiting, bloody stool or any other new or concerning symptoms.  

## 2016-04-17 DIAGNOSIS — M25561 Pain in right knee: Secondary | ICD-10-CM | POA: Diagnosis not present

## 2016-04-19 DIAGNOSIS — M25561 Pain in right knee: Secondary | ICD-10-CM | POA: Diagnosis not present

## 2016-04-23 DIAGNOSIS — M25561 Pain in right knee: Secondary | ICD-10-CM | POA: Diagnosis not present

## 2016-04-25 DIAGNOSIS — M25561 Pain in right knee: Secondary | ICD-10-CM | POA: Diagnosis not present

## 2016-04-27 DIAGNOSIS — M25561 Pain in right knee: Secondary | ICD-10-CM | POA: Diagnosis not present

## 2016-04-30 ENCOUNTER — Inpatient Hospital Stay: Payer: PPO

## 2016-04-30 DIAGNOSIS — M25561 Pain in right knee: Secondary | ICD-10-CM | POA: Diagnosis not present

## 2016-05-02 DIAGNOSIS — M25561 Pain in right knee: Secondary | ICD-10-CM | POA: Diagnosis not present

## 2016-05-04 DIAGNOSIS — M25561 Pain in right knee: Secondary | ICD-10-CM | POA: Diagnosis not present

## 2016-05-07 ENCOUNTER — Ambulatory Visit: Payer: PPO | Admitting: Oncology

## 2016-05-07 DIAGNOSIS — M25561 Pain in right knee: Secondary | ICD-10-CM | POA: Diagnosis not present

## 2016-05-09 DIAGNOSIS — M25561 Pain in right knee: Secondary | ICD-10-CM | POA: Diagnosis not present

## 2016-05-10 DIAGNOSIS — I350 Nonrheumatic aortic (valve) stenosis: Secondary | ICD-10-CM | POA: Diagnosis not present

## 2016-05-10 DIAGNOSIS — I952 Hypotension due to drugs: Secondary | ICD-10-CM | POA: Diagnosis not present

## 2016-05-10 DIAGNOSIS — K219 Gastro-esophageal reflux disease without esophagitis: Secondary | ICD-10-CM | POA: Diagnosis not present

## 2016-05-10 DIAGNOSIS — I2510 Atherosclerotic heart disease of native coronary artery without angina pectoris: Secondary | ICD-10-CM | POA: Diagnosis not present

## 2016-05-11 DIAGNOSIS — M25561 Pain in right knee: Secondary | ICD-10-CM | POA: Diagnosis not present

## 2016-05-14 DIAGNOSIS — M25561 Pain in right knee: Secondary | ICD-10-CM | POA: Diagnosis not present

## 2016-05-15 DIAGNOSIS — Z96651 Presence of right artificial knee joint: Secondary | ICD-10-CM | POA: Diagnosis not present

## 2016-05-18 DIAGNOSIS — M25561 Pain in right knee: Secondary | ICD-10-CM | POA: Diagnosis not present

## 2016-05-22 ENCOUNTER — Inpatient Hospital Stay: Payer: PPO | Attending: Oncology

## 2016-05-22 ENCOUNTER — Other Ambulatory Visit: Payer: Self-pay

## 2016-05-22 DIAGNOSIS — I129 Hypertensive chronic kidney disease with stage 1 through stage 4 chronic kidney disease, or unspecified chronic kidney disease: Secondary | ICD-10-CM | POA: Insufficient documentation

## 2016-05-22 DIAGNOSIS — K219 Gastro-esophageal reflux disease without esophagitis: Secondary | ICD-10-CM | POA: Diagnosis not present

## 2016-05-22 DIAGNOSIS — M1990 Unspecified osteoarthritis, unspecified site: Secondary | ICD-10-CM | POA: Diagnosis not present

## 2016-05-22 DIAGNOSIS — D472 Monoclonal gammopathy: Secondary | ICD-10-CM | POA: Insufficient documentation

## 2016-05-22 DIAGNOSIS — Z79899 Other long term (current) drug therapy: Secondary | ICD-10-CM | POA: Insufficient documentation

## 2016-05-22 DIAGNOSIS — M25561 Pain in right knee: Secondary | ICD-10-CM | POA: Diagnosis not present

## 2016-05-22 DIAGNOSIS — N189 Chronic kidney disease, unspecified: Secondary | ICD-10-CM | POA: Diagnosis not present

## 2016-05-22 DIAGNOSIS — E7800 Pure hypercholesterolemia, unspecified: Secondary | ICD-10-CM | POA: Diagnosis not present

## 2016-05-22 DIAGNOSIS — Z87891 Personal history of nicotine dependence: Secondary | ICD-10-CM | POA: Diagnosis not present

## 2016-05-22 LAB — BASIC METABOLIC PANEL
Anion gap: 7 (ref 5–15)
BUN: 32 mg/dL — AB (ref 6–20)
CALCIUM: 8.9 mg/dL (ref 8.9–10.3)
CHLORIDE: 101 mmol/L (ref 101–111)
CO2: 22 mmol/L (ref 22–32)
CREATININE: 1.64 mg/dL — AB (ref 0.61–1.24)
GFR calc non Af Amer: 37 mL/min — ABNORMAL LOW (ref >60)
GFR, EST AFRICAN AMERICAN: 43 mL/min — AB (ref >60)
Glucose, Bld: 104 mg/dL — ABNORMAL HIGH (ref 65–99)
Potassium: 4.6 mmol/L (ref 3.5–5.1)
SODIUM: 130 mmol/L — AB (ref 135–145)

## 2016-05-22 LAB — CBC WITH DIFFERENTIAL/PLATELET
BASOS PCT: 1 %
Basophils Absolute: 0.1 10*3/uL (ref 0–0.1)
EOS ABS: 0.7 10*3/uL (ref 0–0.7)
Eosinophils Relative: 10 %
HEMATOCRIT: 35.6 % — AB (ref 40.0–52.0)
HEMOGLOBIN: 12.2 g/dL — AB (ref 13.0–18.0)
LYMPHS ABS: 1.4 10*3/uL (ref 1.0–3.6)
LYMPHS PCT: 21 %
MCH: 32.3 pg (ref 26.0–34.0)
MCHC: 34.2 g/dL (ref 32.0–36.0)
MCV: 94.4 fL (ref 80.0–100.0)
MONO ABS: 0.8 10*3/uL (ref 0.2–1.0)
MONOS PCT: 12 %
NEUTROS PCT: 56 %
Neutro Abs: 3.9 10*3/uL (ref 1.4–6.5)
Platelets: 234 10*3/uL (ref 150–440)
RBC: 3.77 MIL/uL — ABNORMAL LOW (ref 4.40–5.90)
RDW: 14 % (ref 11.5–14.5)
WBC: 6.8 10*3/uL (ref 3.8–10.6)

## 2016-05-23 DIAGNOSIS — D472 Monoclonal gammopathy: Secondary | ICD-10-CM | POA: Insufficient documentation

## 2016-05-23 LAB — PROTEIN ELECTROPHORESIS, SERUM
A/G Ratio: 1.5 (ref 0.7–1.7)
ALBUMIN ELP: 3.8 g/dL (ref 2.9–4.4)
Alpha-1-Globulin: 0.2 g/dL (ref 0.0–0.4)
Alpha-2-Globulin: 0.7 g/dL (ref 0.4–1.0)
BETA GLOBULIN: 0.8 g/dL (ref 0.7–1.3)
GAMMA GLOBULIN: 0.9 g/dL (ref 0.4–1.8)
Globulin, Total: 2.6 g/dL (ref 2.2–3.9)
TOTAL PROTEIN ELP: 6.4 g/dL (ref 6.0–8.5)

## 2016-05-23 LAB — PROTEIN ELECTRO, RANDOM URINE
ALPHA-2-GLOBULIN, U: 4.5 %
Albumin ELP, Urine: 56.3 %
Alpha-1-Globulin, U: 5.2 %
Beta Globulin, U: 7 %
Gamma Globulin, U: 27 %
M SPIKE UR: 17.1 % — AB
Total Protein, Urine: 22.5 mg/dL

## 2016-05-23 NOTE — Progress Notes (Signed)
[place]  [phone] [phone]  ID: BANNON GIAMMARCO OB: 11-04-1930  MR#: [id number]  WLN#:[id number]  Patient Care Team: Leonel Ramsay, MD as PCP - General (Infectious Diseases)  CHIEF COMPLAINT: MGUS  INTERVAL HISTORY: Patient returns to clinic today for repeat laboratory and further evaluation.  He continues to feel well and remains asymptomatic. He has no neurologic complaints.  He denies any recent fevers or illnesses.  He has a good appetite and denies weight loss.  He has no chest pain or shortness of breath.  He denies any nausea, vomiting, constipation, or diarrhea.  He has no urinary complaints.  Patient offers no specific complaints today.  REVIEW OF SYSTEMS:   Review of Systems  Constitutional: Negative.  Negative for fever, malaise/fatigue and weight loss.  Respiratory: Negative.  Negative for cough and shortness of breath.   Cardiovascular: Negative.  Negative for chest pain.  Gastrointestinal: Negative.   Genitourinary: Negative.   Musculoskeletal: Negative.   Neurological: Negative.  Negative for weakness.  Psychiatric/Behavioral: Negative.  The patient is not nervous/anxious.     As per HPI. Otherwise, a complete review of systems is negatve.  PAST MEDICAL HISTORY: Past Medical History:  Diagnosis Date  . Anemia   . Aortic aneurysm ([place])   . Aortic aneurysm ([place])   . Arthritis   . CKD (chronic kidney disease)    ALSO LEFT RENAL MASS  . Coronary artery disease   . Diverticulosis    WITH RUPTURE  . GERD (gastroesophageal reflux disease)   . History of hiatal hernia   . Hypercholesteremia   . Hypertension   . MGUS (monoclonal gammopathy of unknown significance)   . MGUS (monoclonal gammopathy of unknown significance)     PAST SURGICAL HISTORY: Past Surgical History:  Procedure Laterality Date  . AORTIC VALVE REPLACEMENT    . bipass    . CARDIAC VALVE REPLACEMENT     pig valve  . CHOLECYSTECTOMY    .  COLON SURGERY    . CORONARY ANGIOPLASTY    . CORONARY ARTERY BYPASS GRAFT    . ESOPHAGOGASTRODUODENOSCOPY (EGD) WITH PROPOFOL N/A 02/07/2015   Procedure: ESOPHAGOGASTRODUODENOSCOPY (EGD) WITH PROPOFOL;  Surgeon: Hulen Luster, MD;  Location: [facility] ENDOSCOPY;  Service: Gastroenterology;  Laterality: N/A;  . EYE SURGERY    . HERNIA REPAIR    . JOINT REPLACEMENT     left knee x4  . KNEE ARTHROPLASTY Right 04/02/2016   Procedure: COMPUTER ASSISTED TOTAL KNEE ARTHROPLASTY;  Surgeon: Dereck Leep, MD;  Location: [facility] ORS;  Service: Orthopedics;  Laterality: Right;    FAMILY HISTORY: Reviewed and unchanged. No reported history of malignancy or chronic disease.     ADVANCED DIRECTIVES:    HEALTH MAINTENANCE: Social History  Substance Use Topics  . Smoking status: Former Research scientist (life sciences)  . Smokeless tobacco: Never Used  . Alcohol use No     Colonoscopy:  PAP:  Bone density:  Lipid panel:  Allergies  Allergen Reactions  . Morphine Anaphylaxis  . Oxycodone Other (See Comments)    Hypotension and bradycardia  . Carvedilol     Dizziness and syncope   . Iodinated Diagnostic Agents     Other reaction(s): Asthenia (finding), Other (qualifier value) paralyzed legs    Current Outpatient Prescriptions  Medication Sig Dispense Refill  . amLODipine (NORVASC) 5 MG tablet Take 5 mg by mouth daily.    . enalapril (VASOTEC) 10 MG tablet Take 20 mg by mouth 2 (two) times daily.     Marland Kitchen  Melatonin 3 MG TABS Take 1.5 mg by mouth at bedtime as needed (sleep).    . pravastatin (PRAVACHOL) 20 MG tablet Take 20 mg by mouth every evening.    . sodium bicarbonate 650 MG tablet Take 1,300 mg by mouth 2 (two) times daily.     No current facility-administered medications for this visit.     OBJECTIVE: Vitals:   05/24/16 1102  BP: (!) 163/78  Pulse: 64  Resp: 18  Temp: 97.8 F (36.6 C)     Body mass index is 24.2 kg/m.    ECOG FS:0 - Asymptomatic  General: Well-developed, well-nourished, no acute  distress. Eyes: Pink conjunctiva, anicteric sclera. Lungs: Clear to auscultation bilaterally. Heart: Regular rate and rhythm. No rubs, murmurs, or gallops. Abdomen: Soft, nontender, nondistended. No organomegaly noted, normoactive bowel sounds. Musculoskeletal: No edema, cyanosis, or clubbing. Neuro: Alert, answering all questions appropriately. Cranial nerves grossly intact. Skin: No rashes or petechiae noted. Psych: Normal affect.   LAB RESULTS:  Lab Results  Component Value Date   NA 130 (L) 05/22/2016   K 4.6 05/22/2016   CL 101 05/22/2016   CO2 22 05/22/2016   GLUCOSE 104 (H) 05/22/2016   BUN 32 (H) 05/22/2016   CREATININE 1.64 (H) 05/22/2016   CALCIUM 8.9 05/22/2016   PROT 6.7 03/21/2016   ALBUMIN 4.0 03/21/2016   AST 25 03/21/2016   ALT 13 (L) 03/21/2016   ALKPHOS 71 03/21/2016   BILITOT 0.6 03/21/2016   GFRNONAA 37 (L) 05/22/2016   GFRAA 43 (L) 05/22/2016    Lab Results  Component Value Date   WBC 6.8 05/22/2016   NEUTROABS 3.9 05/22/2016   HGB 12.2 (L) 05/22/2016   HCT 35.6 (L) 05/22/2016   MCV 94.4 05/22/2016   PLT 234 05/22/2016   Lab Results  Component Value Date   TOTALPROTELP 6.4 05/22/2016   ALBUMINELP 3.8 05/22/2016   A1GS 0.2 05/22/2016   A2GS 0.7 05/22/2016   BETS 0.8 05/22/2016   GAMS 0.9 05/22/2016   MSPIKE Not Observed 05/22/2016   SPEI Comment 05/22/2016     STUDIES: No results found.  ASSESSMENT: MGUS  PLAN:    1.  MGUS: Bone marrow biopsy on May 25, 2015 did not reveal any diagnostic evidence of a plasma cell dyscrasia. Previously, patient had a negative metastatic bone survey. Other than a mild renal insufficiency, patient does not have any evidence of endorgan damage.  Patient continues to have no evidence of M spike in his serum or urine. No intervention is needed at this time. No further follow-up is necessary. Please refer patient back if there are any questions or concerns.  2. Renal insufficiency: Treatment per  nephrology. 3. Hypertension: Patient's blood pressure is elevated today, treatment per primary care.  Patient expressed understanding and was in agreement with this plan. He also understands that He can call clinic at any time with any questions, concerns, or complaints.     Lloyd Huger, MD   05/27/2016 10:48 AM

## 2016-05-24 ENCOUNTER — Inpatient Hospital Stay (HOSPITAL_BASED_OUTPATIENT_CLINIC_OR_DEPARTMENT_OTHER): Payer: PPO | Admitting: Oncology

## 2016-05-24 DIAGNOSIS — Z79899 Other long term (current) drug therapy: Secondary | ICD-10-CM

## 2016-05-24 DIAGNOSIS — D472 Monoclonal gammopathy: Secondary | ICD-10-CM | POA: Diagnosis not present

## 2016-05-24 DIAGNOSIS — I129 Hypertensive chronic kidney disease with stage 1 through stage 4 chronic kidney disease, or unspecified chronic kidney disease: Secondary | ICD-10-CM | POA: Diagnosis not present

## 2016-05-24 DIAGNOSIS — N189 Chronic kidney disease, unspecified: Secondary | ICD-10-CM

## 2016-05-24 DIAGNOSIS — M25561 Pain in right knee: Secondary | ICD-10-CM | POA: Diagnosis not present

## 2016-05-24 NOTE — Progress Notes (Signed)
Feeling well. Offers no complaints.

## 2016-05-29 DIAGNOSIS — M25561 Pain in right knee: Secondary | ICD-10-CM | POA: Diagnosis not present

## 2016-05-31 DIAGNOSIS — M25561 Pain in right knee: Secondary | ICD-10-CM | POA: Diagnosis not present

## 2016-07-12 DIAGNOSIS — I10 Essential (primary) hypertension: Secondary | ICD-10-CM | POA: Diagnosis not present

## 2016-07-12 DIAGNOSIS — R809 Proteinuria, unspecified: Secondary | ICD-10-CM | POA: Diagnosis not present

## 2016-07-12 DIAGNOSIS — N183 Chronic kidney disease, stage 3 (moderate): Secondary | ICD-10-CM | POA: Diagnosis not present

## 2016-07-12 DIAGNOSIS — D631 Anemia in chronic kidney disease: Secondary | ICD-10-CM | POA: Diagnosis not present

## 2016-08-14 DIAGNOSIS — E871 Hypo-osmolality and hyponatremia: Secondary | ICD-10-CM | POA: Diagnosis not present

## 2016-08-14 DIAGNOSIS — I25118 Atherosclerotic heart disease of native coronary artery with other forms of angina pectoris: Secondary | ICD-10-CM | POA: Diagnosis not present

## 2016-08-14 DIAGNOSIS — I10 Essential (primary) hypertension: Secondary | ICD-10-CM | POA: Diagnosis not present

## 2016-08-14 DIAGNOSIS — N183 Chronic kidney disease, stage 3 (moderate): Secondary | ICD-10-CM | POA: Diagnosis not present

## 2016-08-21 DIAGNOSIS — N183 Chronic kidney disease, stage 3 (moderate): Secondary | ICD-10-CM | POA: Diagnosis not present

## 2016-08-21 DIAGNOSIS — Z0000 Encounter for general adult medical examination without abnormal findings: Secondary | ICD-10-CM | POA: Diagnosis not present

## 2016-08-21 DIAGNOSIS — I10 Essential (primary) hypertension: Secondary | ICD-10-CM | POA: Diagnosis not present

## 2016-08-21 DIAGNOSIS — E782 Mixed hyperlipidemia: Secondary | ICD-10-CM | POA: Diagnosis not present

## 2016-08-21 DIAGNOSIS — I255 Ischemic cardiomyopathy: Secondary | ICD-10-CM | POA: Diagnosis not present

## 2016-10-31 DIAGNOSIS — I2510 Atherosclerotic heart disease of native coronary artery without angina pectoris: Secondary | ICD-10-CM | POA: Diagnosis not present

## 2016-10-31 DIAGNOSIS — I340 Nonrheumatic mitral (valve) insufficiency: Secondary | ICD-10-CM | POA: Diagnosis not present

## 2016-10-31 DIAGNOSIS — I952 Hypotension due to drugs: Secondary | ICD-10-CM | POA: Diagnosis not present

## 2016-10-31 DIAGNOSIS — I071 Rheumatic tricuspid insufficiency: Secondary | ICD-10-CM | POA: Diagnosis not present

## 2016-10-31 DIAGNOSIS — I350 Nonrheumatic aortic (valve) stenosis: Secondary | ICD-10-CM | POA: Diagnosis not present

## 2016-10-31 DIAGNOSIS — I255 Ischemic cardiomyopathy: Secondary | ICD-10-CM | POA: Diagnosis not present

## 2016-10-31 DIAGNOSIS — N183 Chronic kidney disease, stage 3 (moderate): Secondary | ICD-10-CM | POA: Diagnosis not present

## 2016-10-31 DIAGNOSIS — I714 Abdominal aortic aneurysm, without rupture: Secondary | ICD-10-CM | POA: Diagnosis not present

## 2016-10-31 DIAGNOSIS — I10 Essential (primary) hypertension: Secondary | ICD-10-CM | POA: Diagnosis not present

## 2016-10-31 DIAGNOSIS — E782 Mixed hyperlipidemia: Secondary | ICD-10-CM | POA: Diagnosis not present

## 2016-10-31 DIAGNOSIS — K219 Gastro-esophageal reflux disease without esophagitis: Secondary | ICD-10-CM | POA: Diagnosis not present

## 2016-11-15 DIAGNOSIS — I129 Hypertensive chronic kidney disease with stage 1 through stage 4 chronic kidney disease, or unspecified chronic kidney disease: Secondary | ICD-10-CM | POA: Diagnosis not present

## 2016-11-15 DIAGNOSIS — I10 Essential (primary) hypertension: Secondary | ICD-10-CM | POA: Diagnosis not present

## 2016-11-15 DIAGNOSIS — E872 Acidosis: Secondary | ICD-10-CM | POA: Diagnosis not present

## 2016-11-15 DIAGNOSIS — N183 Chronic kidney disease, stage 3 (moderate): Secondary | ICD-10-CM | POA: Diagnosis not present

## 2016-11-15 DIAGNOSIS — N2581 Secondary hyperparathyroidism of renal origin: Secondary | ICD-10-CM | POA: Diagnosis not present

## 2016-11-22 ENCOUNTER — Ambulatory Visit: Payer: PPO

## 2016-11-26 ENCOUNTER — Ambulatory Visit
Admission: RE | Admit: 2016-11-26 | Discharge: 2016-11-26 | Disposition: A | Discharge status: Home / Self Care | Payer: PPO | Source: Ambulatory Visit | Attending: Urology | Admitting: Urology

## 2016-11-26 DIAGNOSIS — N400 Enlarged prostate without lower urinary tract symptoms: Secondary | ICD-10-CM | POA: Diagnosis not present

## 2016-11-26 DIAGNOSIS — N281 Cyst of kidney, acquired: Secondary | ICD-10-CM | POA: Diagnosis not present

## 2016-11-28 ENCOUNTER — Telehealth: Payer: Self-pay

## 2016-11-28 NOTE — Telephone Encounter (Signed)
Spoke with pt in reference to benign cyst. Pt voiced understanding.

## 2016-11-28 NOTE — Telephone Encounter (Signed)
James Hughs, MD  Lestine Box, LPN        Please inform the patient that the cyst that we are following has grown slightly, but continues to reflect a benign cyst with no malignant potential.    LMOM

## 2016-12-13 DIAGNOSIS — J042 Acute laryngotracheitis: Secondary | ICD-10-CM | POA: Diagnosis not present

## 2017-02-13 DIAGNOSIS — N183 Chronic kidney disease, stage 3 (moderate): Secondary | ICD-10-CM | POA: Diagnosis not present

## 2017-02-13 DIAGNOSIS — I255 Ischemic cardiomyopathy: Secondary | ICD-10-CM | POA: Diagnosis not present

## 2017-02-13 DIAGNOSIS — I10 Essential (primary) hypertension: Secondary | ICD-10-CM | POA: Diagnosis not present

## 2017-02-13 DIAGNOSIS — E782 Mixed hyperlipidemia: Secondary | ICD-10-CM | POA: Diagnosis not present

## 2017-02-20 DIAGNOSIS — N183 Chronic kidney disease, stage 3 (moderate): Secondary | ICD-10-CM | POA: Diagnosis not present

## 2017-02-20 DIAGNOSIS — D631 Anemia in chronic kidney disease: Secondary | ICD-10-CM | POA: Diagnosis not present

## 2017-02-20 DIAGNOSIS — I255 Ischemic cardiomyopathy: Secondary | ICD-10-CM | POA: Diagnosis not present

## 2017-02-20 DIAGNOSIS — I10 Essential (primary) hypertension: Secondary | ICD-10-CM | POA: Diagnosis not present

## 2017-02-20 DIAGNOSIS — I2510 Atherosclerotic heart disease of native coronary artery without angina pectoris: Secondary | ICD-10-CM | POA: Diagnosis not present

## 2017-02-20 DIAGNOSIS — E782 Mixed hyperlipidemia: Secondary | ICD-10-CM | POA: Diagnosis not present

## 2017-03-04 DIAGNOSIS — R309 Painful micturition, unspecified: Secondary | ICD-10-CM | POA: Diagnosis not present

## 2017-03-04 DIAGNOSIS — R350 Frequency of micturition: Secondary | ICD-10-CM | POA: Diagnosis not present

## 2017-03-18 DIAGNOSIS — I129 Hypertensive chronic kidney disease with stage 1 through stage 4 chronic kidney disease, or unspecified chronic kidney disease: Secondary | ICD-10-CM | POA: Diagnosis not present

## 2017-03-18 DIAGNOSIS — I10 Essential (primary) hypertension: Secondary | ICD-10-CM | POA: Diagnosis not present

## 2017-03-18 DIAGNOSIS — N2581 Secondary hyperparathyroidism of renal origin: Secondary | ICD-10-CM | POA: Diagnosis not present

## 2017-03-18 DIAGNOSIS — E872 Acidosis: Secondary | ICD-10-CM | POA: Diagnosis not present

## 2017-03-18 DIAGNOSIS — N183 Chronic kidney disease, stage 3 (moderate): Secondary | ICD-10-CM | POA: Diagnosis not present

## 2017-03-21 DIAGNOSIS — Z96651 Presence of right artificial knee joint: Secondary | ICD-10-CM | POA: Diagnosis not present

## 2017-03-21 DIAGNOSIS — M533 Sacrococcygeal disorders, not elsewhere classified: Secondary | ICD-10-CM | POA: Diagnosis not present

## 2017-04-11 DIAGNOSIS — Z96651 Presence of right artificial knee joint: Secondary | ICD-10-CM | POA: Diagnosis not present

## 2017-04-11 DIAGNOSIS — M25561 Pain in right knee: Secondary | ICD-10-CM | POA: Diagnosis not present

## 2017-04-11 DIAGNOSIS — M25661 Stiffness of right knee, not elsewhere classified: Secondary | ICD-10-CM | POA: Diagnosis not present

## 2017-04-11 DIAGNOSIS — R29898 Other symptoms and signs involving the musculoskeletal system: Secondary | ICD-10-CM | POA: Diagnosis not present

## 2017-04-15 DIAGNOSIS — M25561 Pain in right knee: Secondary | ICD-10-CM | POA: Diagnosis not present

## 2017-04-18 DIAGNOSIS — M25561 Pain in right knee: Secondary | ICD-10-CM | POA: Diagnosis not present

## 2017-04-23 DIAGNOSIS — I2510 Atherosclerotic heart disease of native coronary artery without angina pectoris: Secondary | ICD-10-CM | POA: Diagnosis not present

## 2017-04-23 DIAGNOSIS — I714 Abdominal aortic aneurysm, without rupture: Secondary | ICD-10-CM | POA: Diagnosis not present

## 2017-04-23 DIAGNOSIS — M25561 Pain in right knee: Secondary | ICD-10-CM | POA: Diagnosis not present

## 2017-04-23 DIAGNOSIS — I340 Nonrheumatic mitral (valve) insufficiency: Secondary | ICD-10-CM | POA: Diagnosis not present

## 2017-04-23 DIAGNOSIS — I255 Ischemic cardiomyopathy: Secondary | ICD-10-CM | POA: Diagnosis not present

## 2017-04-23 DIAGNOSIS — I071 Rheumatic tricuspid insufficiency: Secondary | ICD-10-CM | POA: Diagnosis not present

## 2017-04-23 DIAGNOSIS — I350 Nonrheumatic aortic (valve) stenosis: Secondary | ICD-10-CM | POA: Diagnosis not present

## 2017-04-23 DIAGNOSIS — E782 Mixed hyperlipidemia: Secondary | ICD-10-CM | POA: Diagnosis not present

## 2017-04-23 DIAGNOSIS — I10 Essential (primary) hypertension: Secondary | ICD-10-CM | POA: Diagnosis not present

## 2017-04-30 DIAGNOSIS — M25561 Pain in right knee: Secondary | ICD-10-CM | POA: Diagnosis not present

## 2017-05-02 DIAGNOSIS — M25561 Pain in right knee: Secondary | ICD-10-CM | POA: Diagnosis not present

## 2017-07-18 DIAGNOSIS — L308 Other specified dermatitis: Secondary | ICD-10-CM | POA: Diagnosis not present

## 2017-07-22 DIAGNOSIS — R809 Proteinuria, unspecified: Secondary | ICD-10-CM | POA: Diagnosis not present

## 2017-07-22 DIAGNOSIS — I10 Essential (primary) hypertension: Secondary | ICD-10-CM | POA: Diagnosis not present

## 2017-07-22 DIAGNOSIS — E872 Acidosis: Secondary | ICD-10-CM | POA: Diagnosis not present

## 2017-07-22 DIAGNOSIS — N2581 Secondary hyperparathyroidism of renal origin: Secondary | ICD-10-CM | POA: Diagnosis not present

## 2017-07-22 DIAGNOSIS — I129 Hypertensive chronic kidney disease with stage 1 through stage 4 chronic kidney disease, or unspecified chronic kidney disease: Secondary | ICD-10-CM | POA: Diagnosis not present

## 2017-07-22 DIAGNOSIS — N183 Chronic kidney disease, stage 3 (moderate): Secondary | ICD-10-CM | POA: Diagnosis not present

## 2017-08-02 ENCOUNTER — Ambulatory Visit: Payer: PPO | Admitting: Urology

## 2017-08-02 ENCOUNTER — Encounter: Payer: Self-pay | Admitting: Urology

## 2017-08-02 VITALS — BP 159/82 | HR 82 | Ht 71.0 in | Wt 164.0 lb

## 2017-08-02 DIAGNOSIS — N281 Cyst of kidney, acquired: Secondary | ICD-10-CM

## 2017-08-02 NOTE — Progress Notes (Signed)
08/02/2017 9:53 AM   [name] 03/30/1931 [id number]  Referring provider: Leonel Ramsay, MD [place], [place] [address]  Chief Complaint  Patient presents with  . Renal Cyst    yearly f/u    HPI: 83-year-old white male presents today for follow up. He has a known left complex cyst which has been followed at [facility] urology over the past several years. The patient denies any flank pain. He denies any gross hematuria. He denies any recent weight loss or constitutional symptoms. He has no voiding symptoms.  CT A/P 05/16/11 hypodensities bilateral kidneys, consistent with cysts CT A/P 02/20/13 3.3 x 2.6 cm left interpolar renal mass, increased in size from 05/16/11 MRI abdomen 03/26/13 3.8 cm left renal complex cystic mass MRI Abdomen 10/02/13 4.1 cm lefty renal complex cystic mass, interpolar Renal US (10/05/14) stable left renal mass.  MRI 11/2015: complex cystic renal, intrapolar, 4.7cm U/S 10/2016: "previously demonstrated 4.7 cm septated left renal cyst currently measures 5.4 cm in maximum diameter with no sonographic features concerning for malignancy"  Intv: Patient presents today for follow-up of his left renal mass.  Renal ultrasound in March 2018 showed the cyst to be benign in origin.  He returns today on the advice of his nephrologist.  He has no new complaints.      PMH: Past Medical History:  Diagnosis Date  . Anemia   . Aortic aneurysm ([place])   . Aortic aneurysm ([place])   . Arthritis   . CKD (chronic kidney disease)    ALSO LEFT RENAL MASS  . Coronary artery disease   . Diverticulosis    WITH RUPTURE  . GERD (gastroesophageal reflux disease)   . History of hiatal hernia   . Hypercholesteremia   . Hypertension   . MGUS (monoclonal gammopathy of unknown significance)   . MGUS (monoclonal gammopathy of unknown significance)     Surgical History: Past Surgical History:  Procedure Laterality Date  . AORTIC VALVE REPLACEMENT    . bipass     . CARDIAC VALVE REPLACEMENT     pig valve  . CHOLECYSTECTOMY    . COLON SURGERY    . COMPUTER ASSISTED TOTAL KNEE ARTHROPLASTY Right 04/02/2016   Performed by Dereck Leep, MD at [facility] ORS  . CORONARY ANGIOPLASTY    . CORONARY ARTERY BYPASS GRAFT    . ESOPHAGOGASTRODUODENOSCOPY (EGD) WITH PROPOFOL N/A 02/07/2015   Performed by Hulen Luster, MD at [place]  . EYE SURGERY    . HERNIA REPAIR    . JOINT REPLACEMENT     left knee x4    Home Medications:  Allergies as of 08/02/2017      Reactions   Morphine Anaphylaxis   Oxycodone Other (See Comments)   Hypotension and bradycardia   Carvedilol    Dizziness and syncope    Iodinated Diagnostic Agents    Other reaction(s): Asthenia (finding), Other (qualifier value) paralyzed legs      Medication List        Accurate as of 08/02/17  9:53 AM. Always use your most recent med list.          amLODipine 5 MG tablet Commonly known as:  NORVASC Take 5 mg by mouth daily.   enalapril 10 MG tablet Commonly known as:  VASOTEC Take 20 mg by mouth 2 (two) times daily.   Melatonin 3 MG Tabs Take 1.5 mg by mouth at bedtime as needed (sleep).   pravastatin 20 MG tablet Commonly  known as:  PRAVACHOL Take 20 mg by mouth every evening.   sodium bicarbonate 650 MG tablet Take 1,300 mg by mouth 2 (two) times daily.       Allergies:  Allergies  Allergen Reactions  . Morphine Anaphylaxis  . Oxycodone Other (See Comments)    Hypotension and bradycardia  . Carvedilol     Dizziness and syncope   . Iodinated Diagnostic Agents     Other reaction(s): Asthenia (finding), Other (qualifier value) paralyzed legs    Family History: Family History  Problem Relation Age of Onset  . Bladder Cancer Neg Hx   . Prostate cancer Neg Hx   . Kidney cancer Neg Hx     Social History:  reports that he has quit smoking. he has never used smokeless tobacco. He reports that he does not drink alcohol or use drugs.  ROS: UROLOGY Frequent  Urination?: No Hard to postpone urination?: No Burning/pain with urination?: No Get up at night to urinate?: No Leakage of urine?: No Urine stream starts and stops?: No Trouble starting stream?: No Do you have to strain to urinate?: No Blood in urine?: No Urinary tract infection?: No Sexually transmitted disease?: No Injury to kidneys or bladder?: No Painful intercourse?: No Weak stream?: No Erection problems?: No Penile pain?: No  Gastrointestinal Nausea?: No Vomiting?: No Indigestion/heartburn?: No Diarrhea?: No Constipation?: No  Constitutional Fever: No Night sweats?: No Weight loss?: No Fatigue?: No  Skin Skin rash/lesions?: No Itching?: No  Eyes Blurred vision?: No Double vision?: No  Ears/Nose/Throat Sore throat?: No Sinus problems?: No  Hematologic/Lymphatic Swollen glands?: No Easy bruising?: No  Cardiovascular Leg swelling?: No Chest pain?: No  Respiratory Cough?: No Shortness of breath?: No  Endocrine Excessive thirst?: No  Musculoskeletal Back pain?: No Joint pain?: No  Neurological Headaches?: No Dizziness?: No  Psychologic Depression?: No Anxiety?: No  Physical Exam: BP (!) 159/82 (BP Location: Right Arm, Patient Position: Sitting, Cuff Size: Normal)   Pulse 82   Ht 5\' 11"  (1.803 m)   Wt 164 lb (74.4 kg)   BMI 22.87 kg/m   Constitutional:  Alert and oriented, No acute distress. HEENT: [place] AT, moist mucus membranes.  Trachea midline, no masses. Cardiovascular: No clubbing, cyanosis, or edema. Respiratory: Normal respiratory effort, no increased work of breathing. GI: Abdomen is soft, nontender, nondistended, no abdominal masses GU: No CVA tenderness.  Skin: No rashes, bruises or suspicious lesions. Lymph: No cervical or inguinal adenopathy. Neurologic: Grossly intact, no focal deficits, moving all 4 extremities. Psychiatric: Normal mood and affect.  Laboratory Data: Lab Results  Component Value Date   WBC 6.8  05/22/2016   HGB 12.2 (L) 05/22/2016   HCT 35.6 (L) 05/22/2016   MCV 94.4 05/22/2016   PLT 234 05/22/2016    Lab Results  Component Value Date   CREATININE 1.64 (H) 05/22/2016    No results found for: PSA  No results found for: TESTOSTERONE  Lab Results  Component Value Date   HGBA1C  02/23/2010    5.4 (NOTE)                                                                       According to the ADA Clinical Practice Recommendations for 2011, when HbA1c is  used as a screening test:   >=6.5%   Diagnostic of Diabetes Mellitus           (if abnormal result  is confirmed)  5.7-6.4%   Increased risk of developing Diabetes Mellitus  References:Diagnosis and Classification of Diabetes Mellitus,Diabetes FOYD,7412,87(OMVEH 1):S62-S69 and Standards of Medical Care in         Diabetes - 2011,Diabetes MCNO,7096,28  (Suppl 1):S11-S61.    Urinalysis    Component Value Date/Time   COLORURINE STRAW (A) 03/21/2016 1507   APPEARANCEUR CLEAR (A) 03/21/2016 1507   APPEARANCEUR Clear 11/22/2015 0950   LABSPEC 1.005 03/21/2016 1507   PHURINE 7.0 03/21/2016 1507   GLUCOSEU NEGATIVE 03/21/2016 1507   HGBUR NEGATIVE 03/21/2016 1507   BILIRUBINUR NEGATIVE 03/21/2016 1507   BILIRUBINUR Negative 11/22/2015 0950   KETONESUR NEGATIVE 03/21/2016 1507   PROTEINUR NEGATIVE 03/21/2016 1507   UROBILINOGEN 1.0 02/16/2010 1336   NITRITE NEGATIVE 03/21/2016 1507   LEUKOCYTESUR NEGATIVE 03/21/2016 1507   LEUKOCYTESUR Negative 11/22/2015 0950    Pertinent Imaging: March 2018 renal ultrasound reviewed.  Benign renal cyst.  No sign of complex cyst  Assessment & Plan:    1.  Benign renal cyst I discussed with the patient that his most recent ultrasound shows that the lesion has been followed for a number of years is benign.  It is no features consistent with any risk of malignancy.  He does not need further imaging for this.  He can follow-up as needed.  Return if symptoms worsen or fail to  improve.  Nickie Retort, MD  [facility] [address], [place], [place] [address] [phone]

## 2017-08-20 DIAGNOSIS — N183 Chronic kidney disease, stage 3 (moderate): Secondary | ICD-10-CM | POA: Diagnosis not present

## 2017-08-20 DIAGNOSIS — I10 Essential (primary) hypertension: Secondary | ICD-10-CM | POA: Diagnosis not present

## 2017-08-20 DIAGNOSIS — I255 Ischemic cardiomyopathy: Secondary | ICD-10-CM | POA: Diagnosis not present

## 2017-08-20 DIAGNOSIS — E782 Mixed hyperlipidemia: Secondary | ICD-10-CM | POA: Diagnosis not present

## 2017-08-27 DIAGNOSIS — K219 Gastro-esophageal reflux disease without esophagitis: Secondary | ICD-10-CM | POA: Diagnosis not present

## 2017-08-27 DIAGNOSIS — Z23 Encounter for immunization: Secondary | ICD-10-CM | POA: Diagnosis not present

## 2017-08-27 DIAGNOSIS — I714 Abdominal aortic aneurysm, without rupture: Secondary | ICD-10-CM | POA: Diagnosis not present

## 2017-08-27 DIAGNOSIS — I10 Essential (primary) hypertension: Secondary | ICD-10-CM | POA: Diagnosis not present

## 2017-08-27 DIAGNOSIS — N183 Chronic kidney disease, stage 3 (moderate): Secondary | ICD-10-CM | POA: Diagnosis not present

## 2017-08-27 DIAGNOSIS — E782 Mixed hyperlipidemia: Secondary | ICD-10-CM | POA: Diagnosis not present

## 2017-08-27 DIAGNOSIS — Z0000 Encounter for general adult medical examination without abnormal findings: Secondary | ICD-10-CM | POA: Diagnosis not present

## 2017-08-27 DIAGNOSIS — I255 Ischemic cardiomyopathy: Secondary | ICD-10-CM | POA: Diagnosis not present

## 2017-10-30 DIAGNOSIS — E782 Mixed hyperlipidemia: Secondary | ICD-10-CM | POA: Diagnosis not present

## 2017-10-30 DIAGNOSIS — I2510 Atherosclerotic heart disease of native coronary artery without angina pectoris: Secondary | ICD-10-CM | POA: Diagnosis not present

## 2017-10-30 DIAGNOSIS — I10 Essential (primary) hypertension: Secondary | ICD-10-CM | POA: Diagnosis not present

## 2017-10-30 DIAGNOSIS — I255 Ischemic cardiomyopathy: Secondary | ICD-10-CM | POA: Diagnosis not present

## 2017-10-30 DIAGNOSIS — I714 Abdominal aortic aneurysm, without rupture: Secondary | ICD-10-CM | POA: Diagnosis not present

## 2017-10-30 DIAGNOSIS — I350 Nonrheumatic aortic (valve) stenosis: Secondary | ICD-10-CM | POA: Diagnosis not present

## 2017-10-30 DIAGNOSIS — I340 Nonrheumatic mitral (valve) insufficiency: Secondary | ICD-10-CM | POA: Diagnosis not present

## 2017-10-30 DIAGNOSIS — I071 Rheumatic tricuspid insufficiency: Secondary | ICD-10-CM | POA: Diagnosis not present

## 2017-11-11 DIAGNOSIS — I10 Essential (primary) hypertension: Secondary | ICD-10-CM | POA: Diagnosis not present

## 2017-11-11 DIAGNOSIS — N2581 Secondary hyperparathyroidism of renal origin: Secondary | ICD-10-CM | POA: Diagnosis not present

## 2017-11-11 DIAGNOSIS — I129 Hypertensive chronic kidney disease with stage 1 through stage 4 chronic kidney disease, or unspecified chronic kidney disease: Secondary | ICD-10-CM | POA: Diagnosis not present

## 2017-11-11 DIAGNOSIS — N183 Chronic kidney disease, stage 3 (moderate): Secondary | ICD-10-CM | POA: Diagnosis not present

## 2017-11-11 DIAGNOSIS — E872 Acidosis: Secondary | ICD-10-CM | POA: Diagnosis not present

## 2017-12-23 DIAGNOSIS — B353 Tinea pedis: Secondary | ICD-10-CM | POA: Diagnosis not present

## 2017-12-23 DIAGNOSIS — L0100 Impetigo, unspecified: Secondary | ICD-10-CM | POA: Diagnosis not present

## 2017-12-23 DIAGNOSIS — B351 Tinea unguium: Secondary | ICD-10-CM | POA: Diagnosis not present

## 2017-12-23 DIAGNOSIS — L209 Atopic dermatitis, unspecified: Secondary | ICD-10-CM | POA: Diagnosis not present

## 2017-12-26 DIAGNOSIS — M25562 Pain in left knee: Secondary | ICD-10-CM | POA: Diagnosis not present

## 2017-12-26 DIAGNOSIS — L0100 Impetigo, unspecified: Secondary | ICD-10-CM | POA: Diagnosis not present

## 2018-01-20 DIAGNOSIS — B353 Tinea pedis: Secondary | ICD-10-CM | POA: Diagnosis not present

## 2018-01-20 DIAGNOSIS — R05 Cough: Secondary | ICD-10-CM | POA: Diagnosis not present

## 2018-01-20 DIAGNOSIS — R21 Rash and other nonspecific skin eruption: Secondary | ICD-10-CM | POA: Diagnosis not present

## 2018-01-22 ENCOUNTER — Other Ambulatory Visit: Payer: Self-pay | Admitting: Infectious Diseases

## 2018-01-22 DIAGNOSIS — R053 Chronic cough: Secondary | ICD-10-CM

## 2018-01-22 DIAGNOSIS — R9389 Abnormal findings on diagnostic imaging of other specified body structures: Secondary | ICD-10-CM

## 2018-01-22 DIAGNOSIS — R05 Cough: Secondary | ICD-10-CM

## 2018-01-23 DIAGNOSIS — Z872 Personal history of diseases of the skin and subcutaneous tissue: Secondary | ICD-10-CM | POA: Diagnosis not present

## 2018-01-23 DIAGNOSIS — L2089 Other atopic dermatitis: Secondary | ICD-10-CM | POA: Diagnosis not present

## 2018-01-23 DIAGNOSIS — B372 Candidiasis of skin and nail: Secondary | ICD-10-CM | POA: Diagnosis not present

## 2018-01-23 DIAGNOSIS — B353 Tinea pedis: Secondary | ICD-10-CM | POA: Diagnosis not present

## 2018-01-23 DIAGNOSIS — B351 Tinea unguium: Secondary | ICD-10-CM | POA: Diagnosis not present

## 2018-01-27 ENCOUNTER — Ambulatory Visit
Admission: RE | Admit: 2018-01-27 | Discharge: 2018-01-27 | Disposition: A | Discharge status: Home / Self Care | Payer: PPO | Source: Ambulatory Visit | Attending: Infectious Diseases | Admitting: Infectious Diseases

## 2018-01-27 DIAGNOSIS — I700 Atherosclerosis of aorta: Secondary | ICD-10-CM | POA: Insufficient documentation

## 2018-01-27 DIAGNOSIS — R918 Other nonspecific abnormal finding of lung field: Secondary | ICD-10-CM | POA: Diagnosis not present

## 2018-01-27 DIAGNOSIS — S2242XA Multiple fractures of ribs, left side, initial encounter for closed fracture: Secondary | ICD-10-CM | POA: Diagnosis not present

## 2018-01-27 DIAGNOSIS — R05 Cough: Secondary | ICD-10-CM | POA: Insufficient documentation

## 2018-01-27 DIAGNOSIS — R9389 Abnormal findings on diagnostic imaging of other specified body structures: Secondary | ICD-10-CM | POA: Diagnosis not present

## 2018-01-27 DIAGNOSIS — R053 Chronic cough: Secondary | ICD-10-CM

## 2018-02-19 DIAGNOSIS — K219 Gastro-esophageal reflux disease without esophagitis: Secondary | ICD-10-CM | POA: Diagnosis not present

## 2018-02-19 DIAGNOSIS — E782 Mixed hyperlipidemia: Secondary | ICD-10-CM | POA: Diagnosis not present

## 2018-02-19 DIAGNOSIS — I255 Ischemic cardiomyopathy: Secondary | ICD-10-CM | POA: Diagnosis not present

## 2018-02-19 DIAGNOSIS — N183 Chronic kidney disease, stage 3 (moderate): Secondary | ICD-10-CM | POA: Diagnosis not present

## 2018-02-20 DIAGNOSIS — H903 Sensorineural hearing loss, bilateral: Secondary | ICD-10-CM | POA: Diagnosis not present

## 2018-02-24 DIAGNOSIS — I255 Ischemic cardiomyopathy: Secondary | ICD-10-CM | POA: Diagnosis not present

## 2018-02-24 DIAGNOSIS — E782 Mixed hyperlipidemia: Secondary | ICD-10-CM | POA: Diagnosis not present

## 2018-02-24 DIAGNOSIS — I10 Essential (primary) hypertension: Secondary | ICD-10-CM | POA: Diagnosis not present

## 2018-02-24 DIAGNOSIS — N183 Chronic kidney disease, stage 3 (moderate): Secondary | ICD-10-CM | POA: Diagnosis not present

## 2018-03-03 DIAGNOSIS — L821 Other seborrheic keratosis: Secondary | ICD-10-CM | POA: Diagnosis not present

## 2018-03-03 DIAGNOSIS — B353 Tinea pedis: Secondary | ICD-10-CM | POA: Diagnosis not present

## 2018-03-03 DIAGNOSIS — B351 Tinea unguium: Secondary | ICD-10-CM | POA: Diagnosis not present

## 2018-03-03 DIAGNOSIS — L820 Inflamed seborrheic keratosis: Secondary | ICD-10-CM | POA: Diagnosis not present

## 2018-03-13 DIAGNOSIS — I129 Hypertensive chronic kidney disease with stage 1 through stage 4 chronic kidney disease, or unspecified chronic kidney disease: Secondary | ICD-10-CM | POA: Diagnosis not present

## 2018-03-13 DIAGNOSIS — N2581 Secondary hyperparathyroidism of renal origin: Secondary | ICD-10-CM | POA: Diagnosis not present

## 2018-03-13 DIAGNOSIS — N183 Chronic kidney disease, stage 3 (moderate): Secondary | ICD-10-CM | POA: Diagnosis not present

## 2018-03-13 DIAGNOSIS — E872 Acidosis: Secondary | ICD-10-CM | POA: Diagnosis not present

## 2018-03-25 DIAGNOSIS — Z96651 Presence of right artificial knee joint: Secondary | ICD-10-CM | POA: Diagnosis not present

## 2018-04-24 DIAGNOSIS — B353 Tinea pedis: Secondary | ICD-10-CM | POA: Diagnosis not present

## 2018-04-24 DIAGNOSIS — L309 Dermatitis, unspecified: Secondary | ICD-10-CM | POA: Diagnosis not present

## 2018-04-24 DIAGNOSIS — B351 Tinea unguium: Secondary | ICD-10-CM | POA: Diagnosis not present

## 2018-05-01 DIAGNOSIS — I25118 Atherosclerotic heart disease of native coronary artery with other forms of angina pectoris: Secondary | ICD-10-CM | POA: Diagnosis not present

## 2018-05-01 DIAGNOSIS — I714 Abdominal aortic aneurysm, without rupture: Secondary | ICD-10-CM | POA: Diagnosis not present

## 2018-05-01 DIAGNOSIS — I255 Ischemic cardiomyopathy: Secondary | ICD-10-CM | POA: Diagnosis not present

## 2018-05-01 DIAGNOSIS — I350 Nonrheumatic aortic (valve) stenosis: Secondary | ICD-10-CM | POA: Diagnosis not present

## 2018-05-01 DIAGNOSIS — I10 Essential (primary) hypertension: Secondary | ICD-10-CM | POA: Diagnosis not present

## 2018-05-01 DIAGNOSIS — E782 Mixed hyperlipidemia: Secondary | ICD-10-CM | POA: Diagnosis not present

## 2018-05-07 DIAGNOSIS — L309 Dermatitis, unspecified: Secondary | ICD-10-CM | POA: Diagnosis not present

## 2018-05-07 DIAGNOSIS — B351 Tinea unguium: Secondary | ICD-10-CM | POA: Diagnosis not present

## 2018-05-07 DIAGNOSIS — B353 Tinea pedis: Secondary | ICD-10-CM | POA: Diagnosis not present

## 2018-05-09 DIAGNOSIS — B353 Tinea pedis: Secondary | ICD-10-CM | POA: Diagnosis not present

## 2018-05-15 DIAGNOSIS — I714 Abdominal aortic aneurysm, without rupture: Secondary | ICD-10-CM | POA: Diagnosis not present

## 2018-05-16 DIAGNOSIS — I714 Abdominal aortic aneurysm, without rupture: Secondary | ICD-10-CM | POA: Diagnosis not present

## 2018-05-16 DIAGNOSIS — I6523 Occlusion and stenosis of bilateral carotid arteries: Secondary | ICD-10-CM | POA: Diagnosis not present

## 2018-05-21 DIAGNOSIS — I6523 Occlusion and stenosis of bilateral carotid arteries: Secondary | ICD-10-CM | POA: Diagnosis not present

## 2018-05-21 DIAGNOSIS — I2510 Atherosclerotic heart disease of native coronary artery without angina pectoris: Secondary | ICD-10-CM | POA: Diagnosis not present

## 2018-05-21 DIAGNOSIS — I350 Nonrheumatic aortic (valve) stenosis: Secondary | ICD-10-CM | POA: Diagnosis not present

## 2018-05-21 DIAGNOSIS — I255 Ischemic cardiomyopathy: Secondary | ICD-10-CM | POA: Diagnosis not present

## 2018-05-21 DIAGNOSIS — I10 Essential (primary) hypertension: Secondary | ICD-10-CM | POA: Diagnosis not present

## 2018-05-21 DIAGNOSIS — I714 Abdominal aortic aneurysm, without rupture: Secondary | ICD-10-CM | POA: Diagnosis not present

## 2018-05-22 DIAGNOSIS — B353 Tinea pedis: Secondary | ICD-10-CM | POA: Diagnosis not present

## 2018-05-22 DIAGNOSIS — L309 Dermatitis, unspecified: Secondary | ICD-10-CM | POA: Diagnosis not present

## 2018-05-22 DIAGNOSIS — B351 Tinea unguium: Secondary | ICD-10-CM | POA: Diagnosis not present

## 2018-07-03 DIAGNOSIS — R21 Rash and other nonspecific skin eruption: Secondary | ICD-10-CM | POA: Diagnosis not present

## 2018-07-03 DIAGNOSIS — L309 Dermatitis, unspecified: Secondary | ICD-10-CM | POA: Diagnosis not present

## 2018-07-03 DIAGNOSIS — B353 Tinea pedis: Secondary | ICD-10-CM | POA: Diagnosis not present

## 2018-07-08 DIAGNOSIS — M545 Low back pain: Secondary | ICD-10-CM | POA: Diagnosis not present

## 2018-07-09 DIAGNOSIS — L2089 Other atopic dermatitis: Secondary | ICD-10-CM | POA: Diagnosis not present

## 2018-07-09 DIAGNOSIS — B353 Tinea pedis: Secondary | ICD-10-CM | POA: Diagnosis not present

## 2018-07-15 DIAGNOSIS — L259 Unspecified contact dermatitis, unspecified cause: Secondary | ICD-10-CM | POA: Diagnosis not present

## 2018-07-17 DIAGNOSIS — L259 Unspecified contact dermatitis, unspecified cause: Secondary | ICD-10-CM | POA: Diagnosis not present

## 2018-07-22 DIAGNOSIS — L0390 Cellulitis, unspecified: Secondary | ICD-10-CM | POA: Diagnosis not present

## 2018-07-22 DIAGNOSIS — I10 Essential (primary) hypertension: Secondary | ICD-10-CM | POA: Diagnosis not present

## 2018-07-22 DIAGNOSIS — E782 Mixed hyperlipidemia: Secondary | ICD-10-CM | POA: Diagnosis not present

## 2018-07-22 DIAGNOSIS — I350 Nonrheumatic aortic (valve) stenosis: Secondary | ICD-10-CM | POA: Diagnosis not present

## 2018-07-22 DIAGNOSIS — Z872 Personal history of diseases of the skin and subcutaneous tissue: Secondary | ICD-10-CM | POA: Diagnosis not present

## 2018-07-22 DIAGNOSIS — L2089 Other atopic dermatitis: Secondary | ICD-10-CM | POA: Diagnosis not present

## 2018-07-22 DIAGNOSIS — I2510 Atherosclerotic heart disease of native coronary artery without angina pectoris: Secondary | ICD-10-CM | POA: Diagnosis not present

## 2018-07-24 DIAGNOSIS — N2581 Secondary hyperparathyroidism of renal origin: Secondary | ICD-10-CM | POA: Diagnosis not present

## 2018-07-24 DIAGNOSIS — M545 Low back pain: Secondary | ICD-10-CM | POA: Diagnosis not present

## 2018-07-24 DIAGNOSIS — E871 Hypo-osmolality and hyponatremia: Secondary | ICD-10-CM | POA: Diagnosis not present

## 2018-07-24 DIAGNOSIS — E872 Acidosis: Secondary | ICD-10-CM | POA: Diagnosis not present

## 2018-07-24 DIAGNOSIS — D631 Anemia in chronic kidney disease: Secondary | ICD-10-CM | POA: Diagnosis not present

## 2018-07-24 DIAGNOSIS — N183 Chronic kidney disease, stage 3 (moderate): Secondary | ICD-10-CM | POA: Diagnosis not present

## 2018-07-24 DIAGNOSIS — I129 Hypertensive chronic kidney disease with stage 1 through stage 4 chronic kidney disease, or unspecified chronic kidney disease: Secondary | ICD-10-CM | POA: Diagnosis not present

## 2018-08-19 DIAGNOSIS — N183 Chronic kidney disease, stage 3 (moderate): Secondary | ICD-10-CM | POA: Diagnosis not present

## 2018-08-19 DIAGNOSIS — E782 Mixed hyperlipidemia: Secondary | ICD-10-CM | POA: Diagnosis not present

## 2018-08-19 DIAGNOSIS — I255 Ischemic cardiomyopathy: Secondary | ICD-10-CM | POA: Diagnosis not present

## 2018-08-26 DIAGNOSIS — I350 Nonrheumatic aortic (valve) stenosis: Secondary | ICD-10-CM | POA: Diagnosis not present

## 2018-08-26 DIAGNOSIS — Z0000 Encounter for general adult medical examination without abnormal findings: Secondary | ICD-10-CM | POA: Diagnosis not present

## 2018-08-26 DIAGNOSIS — E782 Mixed hyperlipidemia: Secondary | ICD-10-CM | POA: Diagnosis not present

## 2018-08-26 DIAGNOSIS — N183 Chronic kidney disease, stage 3 (moderate): Secondary | ICD-10-CM | POA: Diagnosis not present

## 2018-08-26 DIAGNOSIS — I10 Essential (primary) hypertension: Secondary | ICD-10-CM | POA: Diagnosis not present

## 2018-08-26 DIAGNOSIS — I2510 Atherosclerotic heart disease of native coronary artery without angina pectoris: Secondary | ICD-10-CM | POA: Diagnosis not present

## 2018-11-18 DIAGNOSIS — E872 Acidosis: Secondary | ICD-10-CM | POA: Diagnosis not present

## 2018-11-18 DIAGNOSIS — E871 Hypo-osmolality and hyponatremia: Secondary | ICD-10-CM | POA: Diagnosis not present

## 2018-11-18 DIAGNOSIS — N2581 Secondary hyperparathyroidism of renal origin: Secondary | ICD-10-CM | POA: Diagnosis not present

## 2018-11-18 DIAGNOSIS — I129 Hypertensive chronic kidney disease with stage 1 through stage 4 chronic kidney disease, or unspecified chronic kidney disease: Secondary | ICD-10-CM | POA: Diagnosis not present

## 2018-11-18 DIAGNOSIS — N183 Chronic kidney disease, stage 3 (moderate): Secondary | ICD-10-CM | POA: Diagnosis not present

## 2018-12-01 DIAGNOSIS — L600 Ingrowing nail: Secondary | ICD-10-CM | POA: Diagnosis not present

## 2018-12-01 DIAGNOSIS — L309 Dermatitis, unspecified: Secondary | ICD-10-CM | POA: Diagnosis not present

## 2018-12-01 DIAGNOSIS — B351 Tinea unguium: Secondary | ICD-10-CM | POA: Diagnosis not present

## 2018-12-31 DIAGNOSIS — E1165 Type 2 diabetes mellitus with hyperglycemia: Secondary | ICD-10-CM | POA: Diagnosis not present

## 2018-12-31 DIAGNOSIS — I10 Essential (primary) hypertension: Secondary | ICD-10-CM | POA: Diagnosis not present

## 2018-12-31 DIAGNOSIS — R21 Rash and other nonspecific skin eruption: Secondary | ICD-10-CM | POA: Diagnosis not present

## 2018-12-31 DIAGNOSIS — E1159 Type 2 diabetes mellitus with other circulatory complications: Secondary | ICD-10-CM | POA: Diagnosis not present

## 2018-12-31 DIAGNOSIS — E1169 Type 2 diabetes mellitus with other specified complication: Secondary | ICD-10-CM | POA: Diagnosis not present

## 2018-12-31 DIAGNOSIS — E782 Mixed hyperlipidemia: Secondary | ICD-10-CM | POA: Diagnosis not present

## 2018-12-31 DIAGNOSIS — L80 Vitiligo: Secondary | ICD-10-CM | POA: Diagnosis not present

## 2019-01-12 DIAGNOSIS — S61412A Laceration without foreign body of left hand, initial encounter: Secondary | ICD-10-CM | POA: Diagnosis not present

## 2019-01-21 DIAGNOSIS — I2510 Atherosclerotic heart disease of native coronary artery without angina pectoris: Secondary | ICD-10-CM | POA: Diagnosis not present

## 2019-01-21 DIAGNOSIS — I255 Ischemic cardiomyopathy: Secondary | ICD-10-CM | POA: Diagnosis not present

## 2019-01-21 DIAGNOSIS — R001 Bradycardia, unspecified: Secondary | ICD-10-CM | POA: Diagnosis not present

## 2019-01-21 DIAGNOSIS — E782 Mixed hyperlipidemia: Secondary | ICD-10-CM | POA: Diagnosis not present

## 2019-01-21 DIAGNOSIS — I10 Essential (primary) hypertension: Secondary | ICD-10-CM | POA: Diagnosis not present

## 2019-02-16 DIAGNOSIS — R05 Cough: Secondary | ICD-10-CM | POA: Diagnosis not present

## 2019-02-16 DIAGNOSIS — T464X5A Adverse effect of angiotensin-converting-enzyme inhibitors, initial encounter: Secondary | ICD-10-CM | POA: Diagnosis not present

## 2019-02-16 DIAGNOSIS — I10 Essential (primary) hypertension: Secondary | ICD-10-CM | POA: Diagnosis not present

## 2019-03-06 DIAGNOSIS — I255 Ischemic cardiomyopathy: Secondary | ICD-10-CM | POA: Diagnosis not present

## 2019-03-06 DIAGNOSIS — I2510 Atherosclerotic heart disease of native coronary artery without angina pectoris: Secondary | ICD-10-CM | POA: Diagnosis not present

## 2019-03-06 DIAGNOSIS — I10 Essential (primary) hypertension: Secondary | ICD-10-CM | POA: Diagnosis not present

## 2019-03-06 DIAGNOSIS — R002 Palpitations: Secondary | ICD-10-CM | POA: Diagnosis not present

## 2019-03-09 DIAGNOSIS — D631 Anemia in chronic kidney disease: Secondary | ICD-10-CM | POA: Diagnosis not present

## 2019-03-09 DIAGNOSIS — R809 Proteinuria, unspecified: Secondary | ICD-10-CM | POA: Diagnosis not present

## 2019-03-09 DIAGNOSIS — E872 Acidosis: Secondary | ICD-10-CM | POA: Diagnosis not present

## 2019-03-09 DIAGNOSIS — N183 Chronic kidney disease, stage 3 (moderate): Secondary | ICD-10-CM | POA: Diagnosis not present

## 2019-03-23 DIAGNOSIS — R002 Palpitations: Secondary | ICD-10-CM | POA: Diagnosis not present

## 2019-03-26 DIAGNOSIS — Z96653 Presence of artificial knee joint, bilateral: Secondary | ICD-10-CM | POA: Diagnosis not present

## 2019-03-26 DIAGNOSIS — M170 Bilateral primary osteoarthritis of knee: Secondary | ICD-10-CM | POA: Diagnosis not present

## 2019-03-31 DIAGNOSIS — Z0001 Encounter for general adult medical examination with abnormal findings: Secondary | ICD-10-CM | POA: Diagnosis not present

## 2019-03-31 DIAGNOSIS — L80 Vitiligo: Secondary | ICD-10-CM | POA: Diagnosis not present

## 2019-03-31 DIAGNOSIS — Z1283 Encounter for screening for malignant neoplasm of skin: Secondary | ICD-10-CM | POA: Diagnosis not present

## 2019-03-31 DIAGNOSIS — Z872 Personal history of diseases of the skin and subcutaneous tissue: Secondary | ICD-10-CM | POA: Diagnosis not present

## 2019-03-31 DIAGNOSIS — I255 Ischemic cardiomyopathy: Secondary | ICD-10-CM | POA: Diagnosis not present

## 2019-03-31 DIAGNOSIS — Z0000 Encounter for general adult medical examination without abnormal findings: Secondary | ICD-10-CM | POA: Diagnosis not present

## 2019-03-31 DIAGNOSIS — N183 Chronic kidney disease, stage 3 (moderate): Secondary | ICD-10-CM | POA: Diagnosis not present

## 2019-03-31 DIAGNOSIS — I10 Essential (primary) hypertension: Secondary | ICD-10-CM | POA: Diagnosis not present

## 2019-03-31 DIAGNOSIS — D2310 Other benign neoplasm of skin of unspecified eyelid, including canthus: Secondary | ICD-10-CM | POA: Diagnosis not present

## 2019-03-31 DIAGNOSIS — E782 Mixed hyperlipidemia: Secondary | ICD-10-CM | POA: Diagnosis not present

## 2019-03-31 DIAGNOSIS — L84 Corns and callosities: Secondary | ICD-10-CM | POA: Diagnosis not present

## 2019-04-07 DIAGNOSIS — I714 Abdominal aortic aneurysm, without rupture: Secondary | ICD-10-CM | POA: Diagnosis not present

## 2019-04-07 DIAGNOSIS — I471 Supraventricular tachycardia: Secondary | ICD-10-CM | POA: Diagnosis not present

## 2019-04-07 DIAGNOSIS — R001 Bradycardia, unspecified: Secondary | ICD-10-CM | POA: Diagnosis not present

## 2019-04-07 DIAGNOSIS — I491 Atrial premature depolarization: Secondary | ICD-10-CM | POA: Diagnosis not present

## 2019-04-07 DIAGNOSIS — I493 Ventricular premature depolarization: Secondary | ICD-10-CM | POA: Diagnosis not present

## 2019-04-16 DIAGNOSIS — I714 Abdominal aortic aneurysm, without rupture: Secondary | ICD-10-CM | POA: Diagnosis not present

## 2019-07-09 DIAGNOSIS — I129 Hypertensive chronic kidney disease with stage 1 through stage 4 chronic kidney disease, or unspecified chronic kidney disease: Secondary | ICD-10-CM | POA: Diagnosis not present

## 2019-07-09 DIAGNOSIS — E871 Hypo-osmolality and hyponatremia: Secondary | ICD-10-CM | POA: Diagnosis not present

## 2019-07-09 DIAGNOSIS — N1832 Chronic kidney disease, stage 3b: Secondary | ICD-10-CM | POA: Diagnosis not present

## 2019-07-09 DIAGNOSIS — E872 Acidosis: Secondary | ICD-10-CM | POA: Diagnosis not present

## 2019-07-09 DIAGNOSIS — D631 Anemia in chronic kidney disease: Secondary | ICD-10-CM | POA: Diagnosis not present

## 2019-07-10 DIAGNOSIS — I10 Essential (primary) hypertension: Secondary | ICD-10-CM | POA: Diagnosis not present

## 2019-07-10 DIAGNOSIS — I2510 Atherosclerotic heart disease of native coronary artery without angina pectoris: Secondary | ICD-10-CM | POA: Diagnosis not present

## 2019-07-10 DIAGNOSIS — I255 Ischemic cardiomyopathy: Secondary | ICD-10-CM | POA: Diagnosis not present

## 2019-07-10 DIAGNOSIS — R001 Bradycardia, unspecified: Secondary | ICD-10-CM | POA: Diagnosis not present

## 2019-07-10 DIAGNOSIS — I714 Abdominal aortic aneurysm, without rupture: Secondary | ICD-10-CM | POA: Diagnosis not present

## 2019-09-07 DIAGNOSIS — I10 Essential (primary) hypertension: Secondary | ICD-10-CM | POA: Diagnosis not present

## 2019-09-07 DIAGNOSIS — I255 Ischemic cardiomyopathy: Secondary | ICD-10-CM | POA: Diagnosis not present

## 2019-09-07 DIAGNOSIS — I6523 Occlusion and stenosis of bilateral carotid arteries: Secondary | ICD-10-CM | POA: Diagnosis not present

## 2019-09-07 DIAGNOSIS — I2510 Atherosclerotic heart disease of native coronary artery without angina pectoris: Secondary | ICD-10-CM | POA: Diagnosis not present

## 2019-09-23 DIAGNOSIS — E782 Mixed hyperlipidemia: Secondary | ICD-10-CM | POA: Diagnosis not present

## 2019-09-23 DIAGNOSIS — N183 Chronic kidney disease, stage 3 unspecified: Secondary | ICD-10-CM | POA: Diagnosis not present

## 2019-09-24 DIAGNOSIS — L600 Ingrowing nail: Secondary | ICD-10-CM | POA: Diagnosis not present

## 2019-09-24 DIAGNOSIS — B351 Tinea unguium: Secondary | ICD-10-CM | POA: Diagnosis not present

## 2019-09-30 DIAGNOSIS — Z0000 Encounter for general adult medical examination without abnormal findings: Secondary | ICD-10-CM | POA: Diagnosis not present

## 2019-09-30 DIAGNOSIS — E782 Mixed hyperlipidemia: Secondary | ICD-10-CM | POA: Diagnosis not present

## 2019-09-30 DIAGNOSIS — N1831 Chronic kidney disease, stage 3a: Secondary | ICD-10-CM | POA: Diagnosis not present

## 2019-09-30 DIAGNOSIS — I2510 Atherosclerotic heart disease of native coronary artery without angina pectoris: Secondary | ICD-10-CM | POA: Diagnosis not present

## 2019-09-30 DIAGNOSIS — I255 Ischemic cardiomyopathy: Secondary | ICD-10-CM | POA: Diagnosis not present

## 2019-09-30 DIAGNOSIS — I10 Essential (primary) hypertension: Secondary | ICD-10-CM | POA: Diagnosis not present

## 2019-11-12 DIAGNOSIS — D631 Anemia in chronic kidney disease: Secondary | ICD-10-CM | POA: Diagnosis not present

## 2019-11-12 DIAGNOSIS — I129 Hypertensive chronic kidney disease with stage 1 through stage 4 chronic kidney disease, or unspecified chronic kidney disease: Secondary | ICD-10-CM | POA: Diagnosis not present

## 2019-11-12 DIAGNOSIS — E872 Acidosis: Secondary | ICD-10-CM | POA: Diagnosis not present

## 2019-11-12 DIAGNOSIS — N1832 Chronic kidney disease, stage 3b: Secondary | ICD-10-CM | POA: Diagnosis not present

## 2019-11-12 DIAGNOSIS — E871 Hypo-osmolality and hyponatremia: Secondary | ICD-10-CM | POA: Diagnosis not present

## 2019-11-12 DIAGNOSIS — R809 Proteinuria, unspecified: Secondary | ICD-10-CM | POA: Diagnosis not present

## 2020-02-02 DIAGNOSIS — Z961 Presence of intraocular lens: Secondary | ICD-10-CM | POA: Diagnosis not present

## 2020-02-04 DIAGNOSIS — H903 Sensorineural hearing loss, bilateral: Secondary | ICD-10-CM | POA: Diagnosis not present

## 2020-02-11 DIAGNOSIS — H903 Sensorineural hearing loss, bilateral: Secondary | ICD-10-CM | POA: Diagnosis not present

## 2020-02-18 DIAGNOSIS — H903 Sensorineural hearing loss, bilateral: Secondary | ICD-10-CM | POA: Diagnosis not present

## 2020-02-18 DIAGNOSIS — H6123 Impacted cerumen, bilateral: Secondary | ICD-10-CM | POA: Diagnosis not present

## 2020-03-04 DIAGNOSIS — I255 Ischemic cardiomyopathy: Secondary | ICD-10-CM | POA: Diagnosis not present

## 2020-03-04 DIAGNOSIS — I2510 Atherosclerotic heart disease of native coronary artery without angina pectoris: Secondary | ICD-10-CM | POA: Diagnosis not present

## 2020-03-04 DIAGNOSIS — I10 Essential (primary) hypertension: Secondary | ICD-10-CM | POA: Diagnosis not present

## 2020-03-04 DIAGNOSIS — I714 Abdominal aortic aneurysm, without rupture: Secondary | ICD-10-CM | POA: Diagnosis not present

## 2020-03-04 DIAGNOSIS — I350 Nonrheumatic aortic (valve) stenosis: Secondary | ICD-10-CM | POA: Diagnosis not present

## 2020-03-22 DIAGNOSIS — I129 Hypertensive chronic kidney disease with stage 1 through stage 4 chronic kidney disease, or unspecified chronic kidney disease: Secondary | ICD-10-CM | POA: Diagnosis not present

## 2020-03-22 DIAGNOSIS — I2510 Atherosclerotic heart disease of native coronary artery without angina pectoris: Secondary | ICD-10-CM | POA: Diagnosis not present

## 2020-03-22 DIAGNOSIS — N1831 Chronic kidney disease, stage 3a: Secondary | ICD-10-CM | POA: Diagnosis not present

## 2020-03-22 DIAGNOSIS — E782 Mixed hyperlipidemia: Secondary | ICD-10-CM | POA: Diagnosis not present

## 2020-03-22 DIAGNOSIS — D631 Anemia in chronic kidney disease: Secondary | ICD-10-CM | POA: Diagnosis not present

## 2020-03-22 DIAGNOSIS — E871 Hypo-osmolality and hyponatremia: Secondary | ICD-10-CM | POA: Diagnosis not present

## 2020-03-22 DIAGNOSIS — E872 Acidosis: Secondary | ICD-10-CM | POA: Diagnosis not present

## 2020-03-29 DIAGNOSIS — Z96653 Presence of artificial knee joint, bilateral: Secondary | ICD-10-CM | POA: Diagnosis not present

## 2020-03-29 DIAGNOSIS — Z96652 Presence of left artificial knee joint: Secondary | ICD-10-CM | POA: Diagnosis not present

## 2020-03-29 DIAGNOSIS — E782 Mixed hyperlipidemia: Secondary | ICD-10-CM | POA: Diagnosis not present

## 2020-03-29 DIAGNOSIS — Z96651 Presence of right artificial knee joint: Secondary | ICD-10-CM | POA: Diagnosis not present

## 2020-03-29 DIAGNOSIS — I10 Essential (primary) hypertension: Secondary | ICD-10-CM | POA: Diagnosis not present

## 2020-03-29 DIAGNOSIS — N1831 Chronic kidney disease, stage 3a: Secondary | ICD-10-CM | POA: Diagnosis not present

## 2020-03-29 DIAGNOSIS — I2510 Atherosclerotic heart disease of native coronary artery without angina pectoris: Secondary | ICD-10-CM | POA: Diagnosis not present

## 2020-03-29 DIAGNOSIS — E871 Hypo-osmolality and hyponatremia: Secondary | ICD-10-CM | POA: Diagnosis not present

## 2020-03-29 DIAGNOSIS — I350 Nonrheumatic aortic (valve) stenosis: Secondary | ICD-10-CM | POA: Diagnosis not present

## 2020-03-29 DIAGNOSIS — Z0001 Encounter for general adult medical examination with abnormal findings: Secondary | ICD-10-CM | POA: Diagnosis not present

## 2020-04-18 DIAGNOSIS — I2510 Atherosclerotic heart disease of native coronary artery without angina pectoris: Secondary | ICD-10-CM | POA: Diagnosis not present

## 2020-04-18 DIAGNOSIS — I771 Stricture of artery: Secondary | ICD-10-CM | POA: Diagnosis not present

## 2020-04-18 DIAGNOSIS — I6523 Occlusion and stenosis of bilateral carotid arteries: Secondary | ICD-10-CM | POA: Diagnosis not present

## 2020-04-18 DIAGNOSIS — I255 Ischemic cardiomyopathy: Secondary | ICD-10-CM | POA: Diagnosis not present

## 2020-04-18 DIAGNOSIS — I714 Abdominal aortic aneurysm, without rupture: Secondary | ICD-10-CM | POA: Diagnosis not present

## 2020-04-18 DIAGNOSIS — Z87891 Personal history of nicotine dependence: Secondary | ICD-10-CM | POA: Diagnosis not present

## 2020-04-18 DIAGNOSIS — I350 Nonrheumatic aortic (valve) stenosis: Secondary | ICD-10-CM | POA: Diagnosis not present

## 2020-04-18 DIAGNOSIS — I708 Atherosclerosis of other arteries: Secondary | ICD-10-CM | POA: Diagnosis not present

## 2020-04-26 DIAGNOSIS — I6523 Occlusion and stenosis of bilateral carotid arteries: Secondary | ICD-10-CM | POA: Diagnosis not present

## 2020-04-26 DIAGNOSIS — E782 Mixed hyperlipidemia: Secondary | ICD-10-CM | POA: Diagnosis not present

## 2020-04-26 DIAGNOSIS — I10 Essential (primary) hypertension: Secondary | ICD-10-CM | POA: Diagnosis not present

## 2020-04-26 DIAGNOSIS — I714 Abdominal aortic aneurysm, without rupture: Secondary | ICD-10-CM | POA: Diagnosis not present

## 2020-04-26 DIAGNOSIS — I2510 Atherosclerotic heart disease of native coronary artery without angina pectoris: Secondary | ICD-10-CM | POA: Diagnosis not present

## 2020-04-28 DIAGNOSIS — L600 Ingrowing nail: Secondary | ICD-10-CM | POA: Diagnosis not present

## 2020-04-28 DIAGNOSIS — B351 Tinea unguium: Secondary | ICD-10-CM | POA: Diagnosis not present

## 2020-04-28 DIAGNOSIS — S0083XA Contusion of other part of head, initial encounter: Secondary | ICD-10-CM | POA: Diagnosis not present

## 2020-04-28 DIAGNOSIS — S51011D Laceration without foreign body of right elbow, subsequent encounter: Secondary | ICD-10-CM | POA: Diagnosis not present

## 2020-04-28 DIAGNOSIS — W19XXXA Unspecified fall, initial encounter: Secondary | ICD-10-CM | POA: Diagnosis not present

## 2020-04-28 DIAGNOSIS — L851 Acquired keratosis [keratoderma] palmaris et plantaris: Secondary | ICD-10-CM | POA: Diagnosis not present

## 2020-05-06 DIAGNOSIS — S2232XA Fracture of one rib, left side, initial encounter for closed fracture: Secondary | ICD-10-CM | POA: Diagnosis not present

## 2020-05-06 DIAGNOSIS — Z952 Presence of prosthetic heart valve: Secondary | ICD-10-CM | POA: Diagnosis not present

## 2020-05-06 DIAGNOSIS — J9811 Atelectasis: Secondary | ICD-10-CM | POA: Diagnosis not present

## 2020-05-11 ENCOUNTER — Other Ambulatory Visit: Payer: Self-pay

## 2020-05-11 ENCOUNTER — Inpatient Hospital Stay
Admission: EM | Admit: 2020-05-11 | Discharge: 2020-05-17 | DRG: 481 | Disposition: A | Discharge status: Skilled Nursing Facility | Payer: PPO | Attending: Internal Medicine | Admitting: Internal Medicine

## 2020-05-11 ENCOUNTER — Emergency Department: Payer: PPO

## 2020-05-11 ENCOUNTER — Encounter: Payer: Self-pay | Admitting: *Deleted

## 2020-05-11 DIAGNOSIS — S72009A Fracture of unspecified part of neck of unspecified femur, initial encounter for closed fracture: Secondary | ICD-10-CM | POA: Diagnosis present

## 2020-05-11 DIAGNOSIS — N401 Enlarged prostate with lower urinary tract symptoms: Secondary | ICD-10-CM | POA: Diagnosis present

## 2020-05-11 DIAGNOSIS — Z952 Presence of prosthetic heart valve: Secondary | ICD-10-CM | POA: Diagnosis not present

## 2020-05-11 DIAGNOSIS — M2550 Pain in unspecified joint: Secondary | ICD-10-CM | POA: Diagnosis not present

## 2020-05-11 DIAGNOSIS — Z20822 Contact with and (suspected) exposure to covid-19: Secondary | ICD-10-CM | POA: Diagnosis present

## 2020-05-11 DIAGNOSIS — N1832 Chronic kidney disease, stage 3b: Secondary | ICD-10-CM | POA: Diagnosis present

## 2020-05-11 DIAGNOSIS — Z419 Encounter for procedure for purposes other than remedying health state, unspecified: Secondary | ICD-10-CM

## 2020-05-11 DIAGNOSIS — E875 Hyperkalemia: Secondary | ICD-10-CM | POA: Diagnosis not present

## 2020-05-11 DIAGNOSIS — N183 Chronic kidney disease, stage 3 unspecified: Secondary | ICD-10-CM | POA: Diagnosis not present

## 2020-05-11 DIAGNOSIS — K5903 Drug induced constipation: Secondary | ICD-10-CM | POA: Diagnosis not present

## 2020-05-11 DIAGNOSIS — Z79899 Other long term (current) drug therapy: Secondary | ICD-10-CM

## 2020-05-11 DIAGNOSIS — S79929A Unspecified injury of unspecified thigh, initial encounter: Secondary | ICD-10-CM | POA: Diagnosis not present

## 2020-05-11 DIAGNOSIS — Z96659 Presence of unspecified artificial knee joint: Secondary | ICD-10-CM

## 2020-05-11 DIAGNOSIS — I739 Peripheral vascular disease, unspecified: Secondary | ICD-10-CM | POA: Diagnosis not present

## 2020-05-11 DIAGNOSIS — R262 Difficulty in walking, not elsewhere classified: Secondary | ICD-10-CM | POA: Diagnosis not present

## 2020-05-11 DIAGNOSIS — Z91041 Radiographic dye allergy status: Secondary | ICD-10-CM

## 2020-05-11 DIAGNOSIS — E7849 Other hyperlipidemia: Secondary | ICD-10-CM | POA: Diagnosis not present

## 2020-05-11 DIAGNOSIS — Z87891 Personal history of nicotine dependence: Secondary | ICD-10-CM

## 2020-05-11 DIAGNOSIS — W010XXA Fall on same level from slipping, tripping and stumbling without subsequent striking against object, initial encounter: Secondary | ICD-10-CM | POA: Diagnosis not present

## 2020-05-11 DIAGNOSIS — Z885 Allergy status to narcotic agent status: Secondary | ICD-10-CM

## 2020-05-11 DIAGNOSIS — Z96653 Presence of artificial knee joint, bilateral: Secondary | ICD-10-CM | POA: Diagnosis present

## 2020-05-11 DIAGNOSIS — N138 Other obstructive and reflux uropathy: Secondary | ICD-10-CM | POA: Diagnosis not present

## 2020-05-11 DIAGNOSIS — N2889 Other specified disorders of kidney and ureter: Secondary | ICD-10-CM | POA: Diagnosis not present

## 2020-05-11 DIAGNOSIS — Z9049 Acquired absence of other specified parts of digestive tract: Secondary | ICD-10-CM

## 2020-05-11 DIAGNOSIS — R140 Abdominal distension (gaseous): Secondary | ICD-10-CM | POA: Diagnosis not present

## 2020-05-11 DIAGNOSIS — I714 Abdominal aortic aneurysm, without rupture, unspecified: Secondary | ICD-10-CM | POA: Diagnosis present

## 2020-05-11 DIAGNOSIS — D472 Monoclonal gammopathy: Secondary | ICD-10-CM | POA: Diagnosis present

## 2020-05-11 DIAGNOSIS — D62 Acute posthemorrhagic anemia: Secondary | ICD-10-CM | POA: Diagnosis not present

## 2020-05-11 DIAGNOSIS — I5022 Chronic systolic (congestive) heart failure: Secondary | ICD-10-CM | POA: Diagnosis present

## 2020-05-11 DIAGNOSIS — I2510 Atherosclerotic heart disease of native coronary artery without angina pectoris: Secondary | ICD-10-CM | POA: Diagnosis not present

## 2020-05-11 DIAGNOSIS — E7800 Pure hypercholesterolemia, unspecified: Secondary | ICD-10-CM | POA: Diagnosis not present

## 2020-05-11 DIAGNOSIS — M179 Osteoarthritis of knee, unspecified: Secondary | ICD-10-CM | POA: Diagnosis present

## 2020-05-11 DIAGNOSIS — I130 Hypertensive heart and chronic kidney disease with heart failure and stage 1 through stage 4 chronic kidney disease, or unspecified chronic kidney disease: Secondary | ICD-10-CM | POA: Diagnosis not present

## 2020-05-11 DIAGNOSIS — M1731 Unilateral post-traumatic osteoarthritis, right knee: Secondary | ICD-10-CM | POA: Diagnosis not present

## 2020-05-11 DIAGNOSIS — E785 Hyperlipidemia, unspecified: Secondary | ICD-10-CM | POA: Diagnosis not present

## 2020-05-11 DIAGNOSIS — I719 Aortic aneurysm of unspecified site, without rupture: Secondary | ICD-10-CM | POA: Diagnosis not present

## 2020-05-11 DIAGNOSIS — M25551 Pain in right hip: Secondary | ICD-10-CM | POA: Diagnosis not present

## 2020-05-11 DIAGNOSIS — M6281 Muscle weakness (generalized): Secondary | ICD-10-CM | POA: Diagnosis not present

## 2020-05-11 DIAGNOSIS — Z4789 Encounter for other orthopedic aftercare: Secondary | ICD-10-CM | POA: Diagnosis not present

## 2020-05-11 DIAGNOSIS — S199XXA Unspecified injury of neck, initial encounter: Secondary | ICD-10-CM | POA: Diagnosis not present

## 2020-05-11 DIAGNOSIS — Z471 Aftercare following joint replacement surgery: Secondary | ICD-10-CM | POA: Diagnosis not present

## 2020-05-11 DIAGNOSIS — I255 Ischemic cardiomyopathy: Secondary | ICD-10-CM | POA: Diagnosis present

## 2020-05-11 DIAGNOSIS — K449 Diaphragmatic hernia without obstruction or gangrene: Secondary | ICD-10-CM | POA: Diagnosis not present

## 2020-05-11 DIAGNOSIS — N179 Acute kidney failure, unspecified: Secondary | ICD-10-CM | POA: Diagnosis not present

## 2020-05-11 DIAGNOSIS — T39395A Adverse effect of other nonsteroidal anti-inflammatory drugs [NSAID], initial encounter: Secondary | ICD-10-CM | POA: Diagnosis not present

## 2020-05-11 DIAGNOSIS — Z953 Presence of xenogenic heart valve: Secondary | ICD-10-CM

## 2020-05-11 DIAGNOSIS — I959 Hypotension, unspecified: Secondary | ICD-10-CM | POA: Diagnosis not present

## 2020-05-11 DIAGNOSIS — N184 Chronic kidney disease, stage 4 (severe): Secondary | ICD-10-CM | POA: Diagnosis not present

## 2020-05-11 DIAGNOSIS — Y92512 Supermarket, store or market as the place of occurrence of the external cause: Secondary | ICD-10-CM

## 2020-05-11 DIAGNOSIS — R52 Pain, unspecified: Secondary | ICD-10-CM

## 2020-05-11 DIAGNOSIS — Z96651 Presence of right artificial knee joint: Secondary | ICD-10-CM | POA: Diagnosis not present

## 2020-05-11 DIAGNOSIS — S72001G Fracture of unspecified part of neck of right femur, subsequent encounter for closed fracture with delayed healing: Secondary | ICD-10-CM | POA: Diagnosis not present

## 2020-05-11 DIAGNOSIS — S72144A Nondisplaced intertrochanteric fracture of right femur, initial encounter for closed fracture: Secondary | ICD-10-CM | POA: Diagnosis not present

## 2020-05-11 DIAGNOSIS — M25561 Pain in right knee: Secondary | ICD-10-CM | POA: Diagnosis not present

## 2020-05-11 DIAGNOSIS — Z951 Presence of aortocoronary bypass graft: Secondary | ICD-10-CM | POA: Diagnosis not present

## 2020-05-11 DIAGNOSIS — E871 Hypo-osmolality and hyponatremia: Secondary | ICD-10-CM | POA: Diagnosis present

## 2020-05-11 DIAGNOSIS — I10 Essential (primary) hypertension: Secondary | ICD-10-CM | POA: Diagnosis not present

## 2020-05-11 DIAGNOSIS — S72001A Fracture of unspecified part of neck of right femur, initial encounter for closed fracture: Secondary | ICD-10-CM

## 2020-05-11 DIAGNOSIS — S0990XA Unspecified injury of head, initial encounter: Secondary | ICD-10-CM | POA: Diagnosis not present

## 2020-05-11 DIAGNOSIS — D696 Thrombocytopenia, unspecified: Secondary | ICD-10-CM | POA: Diagnosis not present

## 2020-05-11 DIAGNOSIS — W19XXXA Unspecified fall, initial encounter: Secondary | ICD-10-CM | POA: Diagnosis not present

## 2020-05-11 DIAGNOSIS — S72141A Displaced intertrochanteric fracture of right femur, initial encounter for closed fracture: Principal | ICD-10-CM | POA: Diagnosis present

## 2020-05-11 DIAGNOSIS — Z7401 Bed confinement status: Secondary | ICD-10-CM | POA: Diagnosis not present

## 2020-05-11 DIAGNOSIS — K219 Gastro-esophageal reflux disease without esophagitis: Secondary | ICD-10-CM | POA: Diagnosis present

## 2020-05-11 DIAGNOSIS — Z888 Allergy status to other drugs, medicaments and biological substances status: Secondary | ICD-10-CM

## 2020-05-11 DIAGNOSIS — I509 Heart failure, unspecified: Secondary | ICD-10-CM | POA: Diagnosis not present

## 2020-05-11 HISTORY — DX: Fracture of unspecified part of neck of unspecified femur, initial encounter for closed fracture: S72.009A

## 2020-05-11 LAB — COMPREHENSIVE METABOLIC PANEL
ALT: 13 U/L (ref 0–44)
AST: 22 U/L (ref 15–41)
Albumin: 3.8 g/dL (ref 3.5–5.0)
Alkaline Phosphatase: 61 U/L (ref 38–126)
Anion gap: 10 (ref 5–15)
BUN: 33 mg/dL — ABNORMAL HIGH (ref 8–23)
CO2: 22 mmol/L (ref 22–32)
Calcium: 8.7 mg/dL — ABNORMAL LOW (ref 8.9–10.3)
Chloride: 95 mmol/L — ABNORMAL LOW (ref 98–111)
Creatinine, Ser: 1.68 mg/dL — ABNORMAL HIGH (ref 0.61–1.24)
GFR calc Af Amer: 42 mL/min — ABNORMAL LOW (ref >60)
GFR calc non Af Amer: 36 mL/min — ABNORMAL LOW (ref >60)
Glucose, Bld: 108 mg/dL — ABNORMAL HIGH (ref 70–99)
Potassium: 4.5 mmol/L (ref 3.5–5.1)
Sodium: 127 mmol/L — ABNORMAL LOW (ref 135–145)
Total Bilirubin: 1.1 mg/dL (ref 0.3–1.2)
Total Protein: 6.7 g/dL (ref 6.5–8.1)

## 2020-05-11 LAB — CBC WITH DIFFERENTIAL/PLATELET
Abs Immature Granulocytes: 0.03 10*3/uL (ref 0.00–0.07)
Basophils Absolute: 0 10*3/uL (ref 0.0–0.1)
Basophils Relative: 1 %
Eosinophils Absolute: 0.4 10*3/uL (ref 0.0–0.5)
Eosinophils Relative: 5 %
HCT: 34.7 % — ABNORMAL LOW (ref 39.0–52.0)
Hemoglobin: 12.1 g/dL — ABNORMAL LOW (ref 13.0–17.0)
Immature Granulocytes: 0 %
Lymphocytes Relative: 22 %
Lymphs Abs: 1.7 10*3/uL (ref 0.7–4.0)
MCH: 32.6 pg (ref 26.0–34.0)
MCHC: 34.9 g/dL (ref 30.0–36.0)
MCV: 93.5 fL (ref 80.0–100.0)
Monocytes Absolute: 1 10*3/uL (ref 0.1–1.0)
Monocytes Relative: 14 %
Neutro Abs: 4.2 10*3/uL (ref 1.7–7.7)
Neutrophils Relative %: 58 %
Platelets: 179 10*3/uL (ref 150–400)
RBC: 3.71 MIL/uL — ABNORMAL LOW (ref 4.22–5.81)
RDW: 13.2 % (ref 11.5–15.5)
WBC: 7.4 10*3/uL (ref 4.0–10.5)
nRBC: 0 % (ref 0.0–0.2)

## 2020-05-11 LAB — SAMPLE TO BLOOD BANK

## 2020-05-11 LAB — SARS CORONAVIRUS 2 BY RT PCR (HOSPITAL ORDER, PERFORMED IN ~~LOC~~ HOSPITAL LAB): SARS Coronavirus 2: NEGATIVE

## 2020-05-11 MED ORDER — IRBESARTAN 150 MG PO TABS
150.0000 mg | ORAL_TABLET | Freq: Two times a day (BID) | ORAL | Status: DC
  Administered 2020-05-11 – 2020-05-12 (×2): 150 mg via ORAL
  Filled 2020-05-11 (×2): qty 1

## 2020-05-11 MED ORDER — ZOLPIDEM TARTRATE 5 MG PO TABS
5.0000 mg | ORAL_TABLET | Freq: Every evening | ORAL | Status: DC | PRN
  Administered 2020-05-13 – 2020-05-14 (×2): 5 mg via ORAL
  Filled 2020-05-11 (×2): qty 1

## 2020-05-11 MED ORDER — ACETAMINOPHEN 500 MG PO TABS
1000.0000 mg | ORAL_TABLET | Freq: Once | ORAL | Status: AC
  Administered 2020-05-11: 1000 mg via ORAL
  Filled 2020-05-11: qty 2

## 2020-05-11 MED ORDER — AMLODIPINE BESYLATE 5 MG PO TABS: 5.0000 mg | ORAL_TABLET | Freq: Every day | ORAL | Status: DC

## 2020-05-11 MED ORDER — PRAVASTATIN SODIUM 20 MG PO TABS
20.0000 mg | ORAL_TABLET | Freq: Every evening | ORAL | Status: DC
  Administered 2020-05-11 – 2020-05-17 (×7): 20 mg via ORAL
  Filled 2020-05-11 (×7): qty 1

## 2020-05-11 MED ORDER — SODIUM BICARBONATE 650 MG PO TABS
1300.0000 mg | ORAL_TABLET | Freq: Two times a day (BID) | ORAL | Status: DC
  Administered 2020-05-11 – 2020-05-17 (×11): 1300 mg via ORAL
  Filled 2020-05-11 (×13): qty 2

## 2020-05-11 MED ORDER — LACTATED RINGERS IV SOLN: INTRAVENOUS | Status: DC

## 2020-05-11 MED ORDER — TRAMADOL-ACETAMINOPHEN 37.5-325 MG PO TABS
1.0000 | ORAL_TABLET | ORAL | Status: DC | PRN
  Administered 2020-05-11 – 2020-05-12 (×2): 1 via ORAL
  Administered 2020-05-12 – 2020-05-15 (×4): 2 via ORAL
  Administered 2020-05-15 – 2020-05-16 (×2): 1 via ORAL
  Filled 2020-05-11: qty 2
  Filled 2020-05-11: qty 1
  Filled 2020-05-11 (×3): qty 2
  Filled 2020-05-11: qty 1
  Filled 2020-05-11: qty 2

## 2020-05-11 MED ORDER — POLYETHYLENE GLYCOL 3350 17 G PO PACK: 17.0000 g | PACK | Freq: Every day | ORAL | Status: DC | PRN

## 2020-05-11 MED ORDER — SODIUM CHLORIDE 0.9 % IV BOLUS
500.0000 mL | Freq: Once | INTRAVENOUS | Status: AC
  Administered 2020-05-11: 500 mL via INTRAVENOUS

## 2020-05-11 NOTE — ED Notes (Signed)
Pt brought in via ems from a store.  Pt tripped and fell over a case of water.  Pt has right hip pain.  No loc.  Denies back pain.  No neck pain.  Pt alert.  Pa-c woods in with pt on arrival to treatment room.  Iv in place.

## 2020-05-11 NOTE — ED Notes (Signed)
Drinking po fluids   Family with pt.

## 2020-05-11 NOTE — ED Notes (Signed)
Report called to deborah rn floor nurse

## 2020-05-11 NOTE — ED Triage Notes (Signed)
Pt brought in via ems from a store.  Pt tripped over a case of water sitting in the floor.  Pt fell onto right hip.  No loc.  No neck or back pain.  Pt alert.

## 2020-05-11 NOTE — ED Provider Notes (Signed)
Emergency Department Provider Note  ____________________________________________  Time seen: Approximately 5:31 PM  I have reviewed the triage vital signs and the nursing notes.   HISTORY  Chief Complaint Fall and Hip Pain   Historian Patient     HPI [name] is a 86 y.o. male presents to the emergency department with acute right hip pain.  Patient slipped while at a local grocery store and fell onto her right hip.  Patient has not been able to stand since injury occurred.  He is unsure if he hit his head.  No numbness or tingling in the upper and lower extremities.  He denies chest pain, chest tightness or abdominal pain.  He lives independently.   Past Medical History:  Diagnosis Date  . Anemia   . Aortic aneurysm ([place])   . Aortic aneurysm ([place])   . Arthritis   . CKD (chronic kidney disease)    ALSO LEFT RENAL MASS  . Coronary artery disease   . Diverticulosis    WITH RUPTURE  . GERD (gastroesophageal reflux disease)   . History of hiatal hernia   . Hypercholesteremia   . Hypertension   . MGUS (monoclonal gammopathy of unknown significance)   . MGUS (monoclonal gammopathy of unknown significance)      Immunizations up to date:  Yes.     Past Medical History:  Diagnosis Date  . Anemia   . Aortic aneurysm ([place])   . Aortic aneurysm ([place])   . Arthritis   . CKD (chronic kidney disease)    ALSO LEFT RENAL MASS  . Coronary artery disease   . Diverticulosis    WITH RUPTURE  . GERD (gastroesophageal reflux disease)   . History of hiatal hernia   . Hypercholesteremia   . Hypertension   . MGUS (monoclonal gammopathy of unknown significance)   . MGUS (monoclonal gammopathy of unknown significance)     Patient Active Problem List   Diagnosis Date Noted  . MGUS (monoclonal gammopathy of unknown significance) 05/23/2016  . S/P total knee arthroplasty 04/02/2016  . Chronic kidney disease 11/22/2015  . CAD in native artery 11/22/2015  . CKD (chronic  kidney disease) stage 3, GFR 30-59 ml/min 06/30/2015  . TI (tricuspid incompetence) 05/11/2015  . Early satiety 01/25/2015  . Benign essential HTN 01/03/2015  . Abdominal aortic aneurysm (AAA) without rupture ([place]) 07/09/2014  . MI (mitral incompetence) 07/09/2014  . Aortic heart valve narrowing 07/09/2014  . Cardiomyopathy, ischemic 06/28/2014  . Arthritis of knee, degenerative 05/17/2014  . Kidney lump 04/09/2013  . Benign prostatic hyperplasia with urinary obstruction 03/17/2013  . Chronic kidney disease, stage IV (severe) ([place]) 03/17/2013  . 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 AORTIC VALVE REPLACEMENT    . bipass    . CARDIAC VALVE REPLACEMENT     pig valve  . CHOLECYSTECTOMY    . COLON SURGERY    . CORONARY ANGIOPLASTY    . CORONARY ARTERY BYPASS GRAFT    . ESOPHAGOGASTRODUODENOSCOPY (EGD) WITH PROPOFOL N/A 02/07/2015   Procedure: ESOPHAGOGASTRODUODENOSCOPY (EGD) WITH PROPOFOL;  Surgeon: Hulen Luster, MD;  Location[facility] ENDOSCOPY;  Service: Gastroenterology;  Laterality: N/A;  . EYE SURGERY    . HERNIA REPAIR    . JOINT REPLACEMENT     left knee x4  . KNEE ARTHROPLASTY Right 04/02/2016   Procedure: COMPUTER ASSISTED TOTAL KNEE ARTHROPLASTY;  Surgeon: Dereck Leep, MD;  Location: [facility] ORS;  Service: Orthopedics;  Laterality: Right;    Prior to Admission medications   Medication Sig Start Date End Date Taking? Authorizing Provider  amLODipine (NORVASC) 5 MG tablet Take 5 mg by mouth daily.    [provider]  enalapril (VASOTEC) 10 MG tablet Take 20 mg by mouth 2 (two) times daily.     [provider]  Melatonin 3 MG TABS Take 1.5 mg by mouth at bedtime as needed (sleep).    [provider]  pravastatin (PRAVACHOL) 20 MG tablet Take 20 mg by mouth every evening.    [provider]  sodium bicarbonate 650 MG tablet Take 1,300 mg by mouth 2 (two) times daily.     [provider]    Allergies Morphine, Oxycodone, Carvedilol, and Iodinated diagnostic agents  Family History  Problem Relation Age of Onset  . Bladder Cancer Neg Hx   . Prostate cancer Neg Hx   . Kidney cancer Neg Hx     Social History Social History   Tobacco Use  . Smoking status: Former Research scientist (life sciences)  . Smokeless tobacco: Never Used  Substance Use Topics  . Alcohol use: No  . Drug use: No     Review of Systems  Constitutional: No fever/chills Eyes:  No discharge ENT: No upper respiratory complaints. Respiratory: no cough. No SOB/ use of accessory muscles to breath Gastrointestinal:   No nausea, no vomiting.  No diarrhea.  No constipation. Musculoskeletal: Patient has right hip pain.  Skin: Negative for rash, abrasions, lacerations, ecchymosis.   ____________________________________________   PHYSICAL EXAM:  VITAL SIGNS: ED Triage Vitals  Enc Vitals Group     BP 05/11/20 1654 (!) 143/61     Pulse Rate 05/11/20 1654 65     Resp 05/11/20 1654 20     Temp 05/11/20 1654 98.1 F (36.7 C)     Temp Source 05/11/20 1654 Oral     SpO2 05/11/20 1654 98 %     Weight 05/11/20 1655 170 lb (77.1 kg)     Height --      Head Circumference --      Peak Flow --      Pain Score 05/11/20 1654 8     Pain Loc --      Pain Edu? --      Excl. in [place]? --      Constitutional: Alert and oriented. Well appearing and in no acute distress. Eyes: Conjunctivae are normal. PERRL. EOMI. Head: Atraumatic. ENT:      Nose: No congestion/rhinnorhea.      Mouth/Throat: Mucous membranes are moist.  Neck: No stridor.  FROM.  Cardiovascular: Normal rate, regular rhythm. Normal S1 and S2.  Good peripheral circulation. Respiratory: Normal respiratory effort without tachypnea or retractions. Lungs CTAB. Good air entry to the bases with no decreased or absent breath sounds Gastrointestinal: Bowel sounds x 4 quadrants. Soft and nontender to palpation. No guarding or rigidity. No  distention. Musculoskeletal: Patient unable to perform full range of motion at the right hip.  Palpable dorsalis pedis pulse, right. Neurologic:  Normal for age. No gross focal neurologic deficits are appreciated.  Skin:  Skin is warm, dry and intact. No rash noted. Psychiatric: Mood and affect are normal for age. Speech and behavior are normal.   ____________________________________________   LABS (all labs ordered are listed, but only abnormal results are displayed)  Labs Reviewed  CBC WITH DIFFERENTIAL/PLATELET - Abnormal; Notable for the following components:      Result Value  RBC 3.71 (*)    Hemoglobin 12.1 (*)    HCT 34.7 (*)    All other components within normal limits  COMPREHENSIVE METABOLIC PANEL - Abnormal; Notable for the following components:   Sodium 127 (*)    Chloride 95 (*)    Glucose, Bld 108 (*)    BUN 33 (*)    Creatinine, Ser 1.68 (*)    Calcium 8.7 (*)    GFR calc non Af Amer 36 (*)    GFR calc Af Amer 42 (*)    All other components within normal limits  SARS CORONAVIRUS 2 BY RT PCR (HOSPITAL ORDER, [facility])  SAMPLE TO BLOOD BANK   ____________________________________________  EKG   ____________________________________________  RADIOLOGY Unk Pinto, personally viewed and evaluated these images (plain radiographs) as part of my medical decision making, as well as reviewing the written report by the radiologist.    DG Chest 1 View  Result Date: 05/11/2020 CLINICAL DATA:  Fall RIGHT hip pain EXAM: CHEST  1 VIEW COMPARISON:  03/28/2010 FINDINGS: Trachea midline. Post median sternotomy for CABG and aortic valve replacement with stable cardiac enlargement. Linear opacities over the LEFT hemidiaphragm similar to previous imaging. No lobar level consolidation. No signs of pleural effusion. On limited assessment skeletal structures without acute process. IMPRESSION: Linear opacities over the LEFT hemidiaphragm similar to  previous imaging may represent scarring or atelectasis. No acute cardiopulmonary disease. Electronically Signed   By: Zetta Bills M.D.   On: 05/11/2020 17:36   DG Hip Unilat W or Wo Pelvis 2-3 Views Right  Result Date: 05/11/2020 CLINICAL DATA:  Right hip pain. Tripped over a case of water. Fall onto right leg. EXAM: DG HIP (WITH OR WITHOUT PELVIS) 2-3V RIGHT COMPARISON:  None. FINDINGS: Displaced intertrochanteric hip fracture with mild proximal migration of the femoral shaft. Mild apex anterior and lateral angulation. The femoral head remains seated. Pubic rami are intact. Bones are diffusely under mineralized. Degenerative change of the sacroiliac joints. Pubic symphysis is congruent. Surgical clips and enteric sutures noted in the lower abdomen. IMPRESSION: Displaced angulated intertrochanteric right hip fracture. Electronically Signed   By: Keith Rake M.D.   On: 05/11/2020 17:40    ____________________________________________    PROCEDURES  Procedure(s) performed:     Procedures     Medications  acetaminophen (TYLENOL) tablet 1,000 mg (1,000 mg Oral Given 05/11/20 1742)  sodium chloride 0.9 % bolus 500 mL (500 mLs Intravenous New Bag/Given 05/11/20 1742)     ____________________________________________   INITIAL IMPRESSION / ASSESSMENT AND PLAN / ED COURSE  Pertinent labs & imaging results that were available during my care of the patient were reviewed by me and considered in my medical decision making (see chart for details).      Assessment and plan Right hip fracture 86 year old male presents to the emergency department with acute right hip pain after mechanical fall.  Vital signs are reassuring at triage.  On physical exam, patient cannot perform range of motion of the right knee due to pain.  He was alert and completely oriented.  Neuro exam was appropriate without acute deficits.  Patient declined pain medication stronger than Tylenol in the emergency  department and concern for adverse reactions to pain medicine that has occurred in the past.  Patient sustained a displaced and angulated right intertrochanteric hip fracture.  Orthopedist on-call, Dr. Harlow Mares was consulted who anticipates taking patient to the OR in the morning.  Patient n.p.o. after midnight.  Prime doc on-call was consulted who accepted patient for admission.       ____________________________________________  FINAL CLINICAL IMPRESSION(S) / ED DIAGNOSES  Final diagnoses:  None      NEW MEDICATIONS STARTED DURING THIS VISIT:  ED Discharge Orders    None          This chart was dictated using voice recognition software/Dragon. Despite best efforts to proofread, errors can occur which can change the meaning. Any change was purely unintentional.     Lannie Fields, PA-C 05/11/20 1816    Vladimir Crofts, MD 05/11/20 Kathyrn Drown

## 2020-05-11 NOTE — ED Notes (Signed)
Foley cath inserted without diff.  Draining clear yellow urine.  Pa and family in with pt again.  Pt alert  Iv fluids infusing.

## 2020-05-11 NOTE — H&P (Signed)
[facility] History and Physical  [name] KYH:[id number] DOB: [birth year] DOA: 05/11/2020  Referring physician: Vallarie Mare, PA-C PCP: Baxter Hire, MD   Chief Complaint: fall  HPI: [name] is a 86 y.o. male with past medical history of CKD, OA, AAA, MGUS, essential hypertension, chronic hyponatremia, CAD status post CABG, left ear status post aortic valve replacement, who presents after a fall.  Patient was at the grocery store earlier today when he slipped and fell onto his right hip.  Patient unsure if he hit his head, denies any chest pain now or at the time of his fall.  Denies any numbness or tingling in any of his extremities.  He has some pain with movement but if he is lying still he denies any pain in his hip. He states he has had anaphylaxis in the past when receiving opioids, though he has taken tramadol without issue.   In the ED patient's vital signs were normal, CMP notable for stable hyponatremia and CKD compared to prior.  CBC was unremarkable except for mild anemia with hemoglobin of 12.  He underwent a Covid test which was negative.  He had a CT head and spine which showed no acute intracranial or cervical abnormality (will read not in computer yet at time of note being written, I personally called [facility] radiology and received verbal report of the read).  He also underwent a plain film of the right hip which showed "Displaced angulated intertrochanteric right hip fracture".  Orthopedics was consulted in the emergency department with plan to take patient to the OR in the morning.  Patient was admitted for further management of his right hip fracture.  Review of Systems:  Pertinent positives and negative per HPI, all others reviewed and negative   Past Medical History:  Diagnosis Date   Anemia    Aortic aneurysm (HCC)    Aortic aneurysm (HCC)    Arthritis    CKD (chronic kidney disease)    ALSO LEFT RENAL MASS   Coronary artery  disease    Diverticulosis    WITH RUPTURE   GERD (gastroesophageal reflux disease)    History of hiatal hernia    Hypercholesteremia    Hypertension    MGUS (monoclonal gammopathy of unknown significance)    MGUS (monoclonal gammopathy of unknown significance)    Past Surgical History:  Procedure Laterality Date   AORTIC VALVE REPLACEMENT     bipass     CARDIAC VALVE REPLACEMENT     pig valve   CHOLECYSTECTOMY     COLON SURGERY     CORONARY ANGIOPLASTY     CORONARY ARTERY BYPASS GRAFT     ESOPHAGOGASTRODUODENOSCOPY (EGD) WITH PROPOFOL N/A 02/07/2015   Procedure: ESOPHAGOGASTRODUODENOSCOPY (EGD) WITH PROPOFOL;  Surgeon: Hulen Luster, MD;  Location: [facility] ENDOSCOPY;  Service: Gastroenterology;  Laterality: N/A;   EYE SURGERY     HERNIA REPAIR     JOINT REPLACEMENT     left knee x4   KNEE ARTHROPLASTY Right 04/02/2016   Procedure: COMPUTER ASSISTED TOTAL KNEE ARTHROPLASTY;  Surgeon: Dereck Leep, MD;  Location: [facility] ORS;  Service: Orthopedics;  Laterality: Right;   Social History:  reports that he has quit smoking. He has never used smokeless tobacco. He reports that he does not drink alcohol and does not use drugs.  Allergies  Allergen Reactions   Morphine Anaphylaxis   Oxycodone Other (See Comments)    Hypotension and bradycardia   Carvedilol     Dizziness  and syncope    Iodinated Diagnostic Agents     Other reaction(s): Asthenia (finding), Other (qualifier value) paralyzed legs    Family History  Problem Relation Age of Onset   Bladder Cancer Neg Hx    Prostate cancer Neg Hx    Kidney cancer Neg Hx     Prior to Admission medications   Medication Sig Start Date End Date Taking? Authorizing Provider  amLODipine (NORVASC) 5 MG tablet Take 5 mg by mouth daily.    [provider]  enalapril (VASOTEC) 10 MG tablet Take 20 mg by mouth 2 (two) times daily.     [provider]  Melatonin 3 MG TABS Take 1.5 mg by mouth at bedtime as  needed (sleep).    [provider]  pravastatin (PRAVACHOL) 20 MG tablet Take 20 mg by mouth every evening.    [provider]  sodium bicarbonate 650 MG tablet Take 1,300 mg by mouth 2 (two) times daily.    [provider]   Physical Exam: Vitals:   05/11/20 1654 05/11/20 1655  BP: (!) 143/61   Pulse: 65   Resp: 20   Temp: 98.1 F (36.7 C)   TempSrc: Oral   SpO2: 98%   Weight:  77.1 kg    Wt Readings from Last 3 Encounters:  05/11/20 77.1 kg  08/02/17 74.4 kg  05/24/16 78.7 kg    General:  Appears calm and comfortable Eyes: PERRL, normal lids, irises & conjunctiva ENT: grossly normal hearing, lips & tongue Neck: no masses Cardiovascular: RRR. No LE edema. LE warm and well perfused. Respiratory: Normal respiratory effort. Abdomen: soft, ntnd Skin: no rash or induration seen on limited exam Musculoskeletal: grossly normal tone BUE/BLE Psychiatric: grossly normal mood and affect, speech fluent and appropriate Neurologic: grossly non-focal. Sensation intact to light touch in b/l LE. Able to move LE bilaterally.          Labs on Admission:  Basic Metabolic Panel: Recent Labs  Lab 05/11/20 1702  NA 127*  K 4.5  CL 95*  CO2 22  GLUCOSE 108*  BUN 33*  CREATININE 1.68*  CALCIUM 8.7*   Liver Function Tests: Recent Labs  Lab 05/11/20 1702  AST 22  ALT 13  ALKPHOS 61  BILITOT 1.1  PROT 6.7  ALBUMIN 3.8   No results for input(s): LIPASE, AMYLASE in the last 168 hours. No results for input(s): AMMONIA in the last 168 hours. CBC: Recent Labs  Lab 05/11/20 1702  WBC 7.4  NEUTROABS 4.2  HGB 12.1*  HCT 34.7*  MCV 93.5  PLT 179   Cardiac Enzymes: No results for input(s): CKTOTAL, CKMB, CKMBINDEX, TROPONINI in the last 168 hours.  BNP (last 3 results) No results for input(s): BNP in the last 8760 hours.  ProBNP (last 3 results) No results for input(s): PROBNP in the last 8760 hours.  CBG: No results for input(s): GLUCAP in  the last 168 hours.  Radiological Exams on Admission: DG Chest 1 View  Result Date: 05/11/2020 CLINICAL DATA:  Fall RIGHT hip pain EXAM: CHEST  1 VIEW COMPARISON:  03/28/2010 FINDINGS: Trachea midline. Post median sternotomy for CABG and aortic valve replacement with stable cardiac enlargement. Linear opacities over the LEFT hemidiaphragm similar to previous imaging. No lobar level consolidation. No signs of pleural effusion. On limited assessment skeletal structures without acute process. IMPRESSION: Linear opacities over the LEFT hemidiaphragm similar to previous imaging may represent scarring or atelectasis. No acute cardiopulmonary disease. Electronically Signed   By:  Zetta Bills M.D.   On: 05/11/2020 17:36   DG Hip Unilat W or Wo Pelvis 2-3 Views Right  Result Date: 05/11/2020 CLINICAL DATA:  Right hip pain. Tripped over a case of water. Fall onto right leg. EXAM: DG HIP (WITH OR WITHOUT PELVIS) 2-3V RIGHT COMPARISON:  None. FINDINGS: Displaced intertrochanteric hip fracture with mild proximal migration of the femoral shaft. Mild apex anterior and lateral angulation. The femoral head remains seated. Pubic rami are intact. Bones are diffusely under mineralized. Degenerative change of the sacroiliac joints. Pubic symphysis is congruent. Surgical clips and enteric sutures noted in the lower abdomen. IMPRESSION: Displaced angulated intertrochanteric right hip fracture. Electronically Signed   By: Keith Rake M.D.   On: 05/11/2020 17:40    EKG: Not obtained  Assessment/Plan Active Problems:   CKD (chronic kidney disease) stage 3, GFR 30-59 ml/min   Abdominal aortic aneurysm (AAA) without rupture (HCC)   Arthritis of knee, degenerative   Benign essential HTN   CAD in native artery   S/P total knee arthroplasty   MGUS (monoclonal gammopathy of unknown significance)   Hip fracture (HCC)   #Right hip fracture Patient is status post mechanical fall at the grocery store resulting in  right hip fracture.  The [facility] has already been consulted by ED provider with plan to take to the OR tomorrow morning. Patient last saw cardiology at [facility] on August 10 of this year.  At that time patient was doing well and was not having any concerning symptoms with regards to his cardiac status.  Does have fairly significant cardiac history, however given prior functional status and lower risk surgery Lyndel Safe perioperative risk is only 0.4%.  Pain control may be an issue as patient reports he has anaphylaxis with any type of opioid, although he does state he has been able to take tramadol in the past without issue. -N.p.o. at midnight -LR at 100 cc/h starting at midnight -SCDs -No opioids with exception of tramadol -Remainder of care per orthopedic service  #Hyponatremia Appears to be chronic based on review of prior lab results with ranges from 1 26-1 30 over the past 4 years.  Patient is asymptomatic and take salt tabs regularly. -Trend BMP -Continue salt tabs  #CKD Baseline creatinine from 3 years ago was 1.6, patient is at baseline today.  #HTN Continue amlodipine, irbesartan  #HLD Continue pravastatin.   Code Status: Full Code DVT Prophylaxis: SCDs Family Communication: family updated at bedside Disposition Plan: Inpatient  Time spent: 41 minutes  Clarnce Flock MD/MPH [facility]

## 2020-05-12 ENCOUNTER — Encounter
Admission: EM | Disposition: A | Discharge status: Skilled Nursing Facility | Payer: Self-pay | Source: Home / Self Care | Attending: Student

## 2020-05-12 ENCOUNTER — Inpatient Hospital Stay: Payer: PPO

## 2020-05-12 ENCOUNTER — Inpatient Hospital Stay: Payer: PPO | Admitting: Anesthesiology

## 2020-05-12 DIAGNOSIS — I5022 Chronic systolic (congestive) heart failure: Secondary | ICD-10-CM

## 2020-05-12 DIAGNOSIS — D472 Monoclonal gammopathy: Secondary | ICD-10-CM

## 2020-05-12 DIAGNOSIS — Z951 Presence of aortocoronary bypass graft: Secondary | ICD-10-CM

## 2020-05-12 DIAGNOSIS — I714 Abdominal aortic aneurysm, without rupture: Secondary | ICD-10-CM

## 2020-05-12 DIAGNOSIS — I255 Ischemic cardiomyopathy: Secondary | ICD-10-CM

## 2020-05-12 DIAGNOSIS — N1832 Chronic kidney disease, stage 3b: Secondary | ICD-10-CM

## 2020-05-12 DIAGNOSIS — Z952 Presence of prosthetic heart valve: Secondary | ICD-10-CM

## 2020-05-12 HISTORY — PX: INTRAMEDULLARY (IM) NAIL INTERTROCHANTERIC: SHX5875

## 2020-05-12 LAB — SURGICAL PCR SCREEN
MRSA, PCR: NEGATIVE
Staphylococcus aureus: NEGATIVE

## 2020-05-12 LAB — BASIC METABOLIC PANEL
Anion gap: 8 (ref 5–15)
BUN: 27 mg/dL — ABNORMAL HIGH (ref 8–23)
CO2: 21 mmol/L — ABNORMAL LOW (ref 22–32)
Calcium: 8.5 mg/dL — ABNORMAL LOW (ref 8.9–10.3)
Chloride: 98 mmol/L (ref 98–111)
Creatinine, Ser: 1.49 mg/dL — ABNORMAL HIGH (ref 0.61–1.24)
GFR calc Af Amer: 48 mL/min — ABNORMAL LOW (ref >60)
GFR calc non Af Amer: 42 mL/min — ABNORMAL LOW (ref >60)
Glucose, Bld: 123 mg/dL — ABNORMAL HIGH (ref 70–99)
Potassium: 4.5 mmol/L (ref 3.5–5.1)
Sodium: 127 mmol/L — ABNORMAL LOW (ref 135–145)

## 2020-05-12 LAB — CBC
HCT: 32.2 % — ABNORMAL LOW (ref 39.0–52.0)
Hemoglobin: 11.8 g/dL — ABNORMAL LOW (ref 13.0–17.0)
MCH: 32.9 pg (ref 26.0–34.0)
MCHC: 36.6 g/dL — ABNORMAL HIGH (ref 30.0–36.0)
MCV: 89.7 fL (ref 80.0–100.0)
Platelets: 179 10*3/uL (ref 150–400)
RBC: 3.59 MIL/uL — ABNORMAL LOW (ref 4.22–5.81)
RDW: 13.1 % (ref 11.5–15.5)
WBC: 10.9 10*3/uL — ABNORMAL HIGH (ref 4.0–10.5)
nRBC: 0 % (ref 0.0–0.2)

## 2020-05-12 LAB — TSH: TSH: 3.808 u[IU]/mL (ref 0.350–4.500)

## 2020-05-12 LAB — PROTIME-INR
INR: 1.1 (ref 0.8–1.2)
Prothrombin Time: 13.8 seconds (ref 11.4–15.2)

## 2020-05-12 LAB — SODIUM, URINE, RANDOM: Sodium, Ur: 104 mmol/L

## 2020-05-12 LAB — OSMOLALITY, URINE: Osmolality, Ur: 407 mOsm/kg (ref 300–900)

## 2020-05-12 SURGERY — FIXATION, FRACTURE, INTERTROCHANTERIC, WITH INTRAMEDULLARY ROD
Anesthesia: General | Laterality: Right

## 2020-05-12 MED ORDER — ONDANSETRON HCL 4 MG/2ML IJ SOLN
INTRAMUSCULAR | Status: DC | PRN
  Administered 2020-05-12: 4 mg via INTRAVENOUS

## 2020-05-12 MED ORDER — PHENYLEPHRINE HCL (PRESSORS) 10 MG/ML IV SOLN
INTRAVENOUS | Status: AC
  Filled 2020-05-12: qty 1

## 2020-05-12 MED ORDER — LIDOCAINE HCL (CARDIAC) PF 100 MG/5ML IV SOSY
PREFILLED_SYRINGE | INTRAVENOUS | Status: DC | PRN
  Administered 2020-05-12: 40 mg via INTRAVENOUS

## 2020-05-12 MED ORDER — DOCUSATE SODIUM 100 MG PO CAPS
100.0000 mg | ORAL_CAPSULE | Freq: Two times a day (BID) | ORAL | Status: DC
  Administered 2020-05-12 – 2020-05-17 (×9): 100 mg via ORAL
  Filled 2020-05-12 (×11): qty 1

## 2020-05-12 MED ORDER — SODIUM CHLORIDE 0.9 % IV SOLN: INTRAVENOUS | Status: DC | PRN

## 2020-05-12 MED ORDER — CEFAZOLIN SODIUM 1 G IJ SOLR
INTRAMUSCULAR | Status: AC
  Filled 2020-05-12: qty 20

## 2020-05-12 MED ORDER — METOCLOPRAMIDE HCL 10 MG PO TABS: 5.0000 mg | ORAL_TABLET | Freq: Three times a day (TID) | ORAL | Status: DC | PRN

## 2020-05-12 MED ORDER — PROPOFOL 10 MG/ML IV BOLUS
INTRAVENOUS | Status: AC
  Filled 2020-05-12: qty 20

## 2020-05-12 MED ORDER — ACETAMINOPHEN 500 MG PO TABS
500.0000 mg | ORAL_TABLET | Freq: Four times a day (QID) | ORAL | Status: AC
  Administered 2020-05-12 – 2020-05-13 (×4): 500 mg via ORAL
  Filled 2020-05-12 (×4): qty 1

## 2020-05-12 MED ORDER — CHLORHEXIDINE GLUCONATE CLOTH 2 % EX PADS
6.0000 | MEDICATED_PAD | Freq: Every day | CUTANEOUS | Status: DC
  Administered 2020-05-12 – 2020-05-17 (×2): 6 via TOPICAL

## 2020-05-12 MED ORDER — FENTANYL CITRATE (PF) 100 MCG/2ML IJ SOLN
INTRAMUSCULAR | Status: DC | PRN
  Administered 2020-05-12 (×2): 50 ug via INTRAVENOUS

## 2020-05-12 MED ORDER — METOCLOPRAMIDE HCL 5 MG/ML IJ SOLN: 5.0000 mg | Freq: Three times a day (TID) | INTRAMUSCULAR | Status: DC | PRN

## 2020-05-12 MED ORDER — FENTANYL CITRATE (PF) 100 MCG/2ML IJ SOLN: 25.0000 ug | INTRAMUSCULAR | Status: DC | PRN

## 2020-05-12 MED ORDER — TRANEXAMIC ACID 1000 MG/10ML IV SOLN
INTRAVENOUS | Status: DC | PRN
  Administered 2020-05-12: 1000 mg via TOPICAL

## 2020-05-12 MED ORDER — ONDANSETRON HCL 4 MG/2ML IJ SOLN: 4.0000 mg | Freq: Once | INTRAMUSCULAR | Status: DC | PRN

## 2020-05-12 MED ORDER — PHENYLEPHRINE HCL (PRESSORS) 10 MG/ML IV SOLN
INTRAVENOUS | Status: DC | PRN
  Administered 2020-05-12 (×2): 100 ug via INTRAVENOUS

## 2020-05-12 MED ORDER — BUPIVACAINE HCL (PF) 0.5 % IJ SOLN
INTRAMUSCULAR | Status: DC | PRN
  Administered 2020-05-12: 30 mL

## 2020-05-12 MED ORDER — BUPIVACAINE HCL (PF) 0.5 % IJ SOLN
INTRAMUSCULAR | Status: AC
  Filled 2020-05-12: qty 10

## 2020-05-12 MED ORDER — LACTATED RINGERS IV SOLN: INTRAVENOUS | Status: DC

## 2020-05-12 MED ORDER — KETOROLAC TROMETHAMINE 15 MG/ML IJ SOLN
15.0000 mg | Freq: Four times a day (QID) | INTRAMUSCULAR | Status: DC
  Administered 2020-05-12 – 2020-05-13 (×2): 15 mg via INTRAVENOUS
  Filled 2020-05-12 (×2): qty 1

## 2020-05-12 MED ORDER — SODIUM CHLORIDE 0.9 % IV SOLN
INTRAVENOUS | Status: DC | PRN
  Administered 2020-05-12: 50 ug/min via INTRAVENOUS

## 2020-05-12 MED ORDER — EPHEDRINE SULFATE 50 MG/ML IJ SOLN
INTRAMUSCULAR | Status: DC | PRN
  Administered 2020-05-12: 10 mg via INTRAVENOUS

## 2020-05-12 MED ORDER — ACETAMINOPHEN 10 MG/ML IV SOLN: 1000.0000 mg | Freq: Once | INTRAVENOUS | Status: DC | PRN

## 2020-05-12 MED ORDER — ACETAMINOPHEN 10 MG/ML IV SOLN
INTRAVENOUS | Status: DC | PRN
  Administered 2020-05-12: 1000 mg via INTRAVENOUS

## 2020-05-12 MED ORDER — FENTANYL CITRATE (PF) 100 MCG/2ML IJ SOLN
INTRAMUSCULAR | Status: AC
  Filled 2020-05-12: qty 2

## 2020-05-12 MED ORDER — TRANEXAMIC ACID 1000 MG/10ML IV SOLN
2000.0000 mg | Freq: Once | INTRAVENOUS | Status: DC
  Filled 2020-05-12: qty 20

## 2020-05-12 MED ORDER — CEFAZOLIN SODIUM-DEXTROSE 2-3 GM-%(50ML) IV SOLR
INTRAVENOUS | Status: DC | PRN
  Administered 2020-05-12: 2 g via INTRAVENOUS

## 2020-05-12 MED ORDER — ONDANSETRON HCL 4 MG/2ML IJ SOLN: 4.0000 mg | Freq: Four times a day (QID) | INTRAMUSCULAR | Status: DC | PRN

## 2020-05-12 MED ORDER — EPHEDRINE 5 MG/ML INJ
INTRAVENOUS | Status: AC
  Filled 2020-05-12: qty 10

## 2020-05-12 MED ORDER — ACETAMINOPHEN 10 MG/ML IV SOLN
INTRAVENOUS | Status: AC
  Filled 2020-05-12: qty 100

## 2020-05-12 MED ORDER — CEFAZOLIN SODIUM-DEXTROSE 2-4 GM/100ML-% IV SOLN
2.0000 g | Freq: Four times a day (QID) | INTRAVENOUS | Status: AC
  Administered 2020-05-12 – 2020-05-13 (×3): 2 g via INTRAVENOUS
  Filled 2020-05-12 (×3): qty 100

## 2020-05-12 MED ORDER — PROPOFOL 500 MG/50ML IV EMUL
INTRAVENOUS | Status: DC | PRN
  Administered 2020-05-12: 50 ug/kg/min via INTRAVENOUS

## 2020-05-12 MED ORDER — ONDANSETRON HCL 4 MG PO TABS: 4.0000 mg | ORAL_TABLET | Freq: Four times a day (QID) | ORAL | Status: DC | PRN

## 2020-05-12 MED ORDER — BUPIVACAINE HCL (PF) 0.5 % IJ SOLN
INTRAMUSCULAR | Status: DC | PRN
  Administered 2020-05-12: 3 mL via INTRATHECAL

## 2020-05-12 MED ORDER — SODIUM CHLORIDE 0.9 % IV SOLN: INTRAVENOUS | Status: AC | PRN

## 2020-05-12 MED ORDER — ENOXAPARIN SODIUM 40 MG/0.4ML ~~LOC~~ SOLN: 40.0000 mg | SUBCUTANEOUS | Status: DC

## 2020-05-12 MED ORDER — PROPOFOL 10 MG/ML IV BOLUS
INTRAVENOUS | Status: DC | PRN
  Administered 2020-05-12: 30 mg via INTRAVENOUS

## 2020-05-12 SURGICAL SUPPLY — 35 items
BLADE TFNA HELICAL 100 NS (Anchor) ×3 IMPLANT
BNDG COHESIVE 4X5 TAN STRL (GAUZE/BANDAGES/DRESSINGS) IMPLANT
BRUSH SCRUB EZ  4% CHG (MISCELLANEOUS) ×4
BRUSH SCRUB EZ 4% CHG (MISCELLANEOUS) ×2 IMPLANT
CANISTER SUCT 1200ML W/VALVE (MISCELLANEOUS) ×3 IMPLANT
CHLORAPREP W/TINT 26 (MISCELLANEOUS) ×3 IMPLANT
COVER WAND RF STERILE (DRAPES) ×3 IMPLANT
DRAPE 3/4 80X56 (DRAPES) ×3 IMPLANT
DRAPE U-SHAPE 47X51 STRL (DRAPES) ×3 IMPLANT
DRSG AQUACEL AG ADV 3.5X 4 (GAUZE/BANDAGES/DRESSINGS) IMPLANT
DRSG AQUACEL AG ADV 3.5X10 (GAUZE/BANDAGES/DRESSINGS) ×3 IMPLANT
ELECT REM PT RETURN 9FT ADLT (ELECTROSURGICAL) ×3
ELECTRODE REM PT RTRN 9FT ADLT (ELECTROSURGICAL) ×1 IMPLANT
GAUZE XEROFORM 1X8 LF (GAUZE/BANDAGES/DRESSINGS) ×3 IMPLANT
GLOVE INDICATOR 8.0 STRL GRN (GLOVE) ×3 IMPLANT
GLOVE SURG ORTHO 8.0 STRL STRW (GLOVE) ×3 IMPLANT
GOWN STRL REUS W/ TWL LRG LVL3 (GOWN DISPOSABLE) ×1 IMPLANT
GOWN STRL REUS W/ TWL XL LVL3 (GOWN DISPOSABLE) ×1 IMPLANT
GOWN STRL REUS W/TWL LRG LVL3 (GOWN DISPOSABLE) ×2
GOWN STRL REUS W/TWL XL LVL3 (GOWN DISPOSABLE) ×2
GUIDEWIRE 3.2X400 (WIRE) ×3 IMPLANT
KIT PATIENT CARE HANA TABLE (KITS) ×3 IMPLANT
KIT TURNOVER CYSTO (KITS) ×3 IMPLANT
MAT ABSORB  FLUID 56X50 GRAY (MISCELLANEOUS) ×2
MAT ABSORB FLUID 56X50 GRAY (MISCELLANEOUS) ×1 IMPLANT
NAIL CAN TFNA 9 130D 400 RT (Nail) ×3 IMPLANT
NEEDLE SPNL 20GX3.5 QUINCKE YW (NEEDLE) ×3 IMPLANT
NS IRRIG 1000ML POUR BTL (IV SOLUTION) ×3 IMPLANT
PACK HIP COMPR (MISCELLANEOUS) ×3 IMPLANT
STAPLER SKIN PROX 35W (STAPLE) ×3 IMPLANT
SUT VIC AB 0 CT1 36 (SUTURE) ×3 IMPLANT
SUT VIC AB 2-0 CT1 27 (SUTURE) ×2
SUT VIC AB 2-0 CT1 TAPERPNT 27 (SUTURE) ×1 IMPLANT
SYR 30ML LL (SYRINGE) ×3 IMPLANT
TOWEL OR 17X26 4PK STRL BLUE (TOWEL DISPOSABLE) ×3 IMPLANT

## 2020-05-12 NOTE — Op Note (Signed)
DATE OF SURGERY:  05/12/2020  TIME: 4:38 PM  PATIENT NAME:  [name]  AGE: 86 y.o.  PRE-OPERATIVE DIAGNOSIS:  Right Hip Fracture  POST-OPERATIVE DIAGNOSIS:  SAME  PROCEDURE:  RIGHT INTRAMEDULLARY (IM) NAIL INTERTROCHANTRIC  SURGEON:  Lovell Sheehan  EBL:  50 cc  COMPLICATIONS:  None apparent  OPERATIVE IMPLANTS: Synthes trochanteric femoral nail  400 mm by 9 mm  with interlocking helical blade  552 mm  PREOPERATIVE INDICATIONS:  [name] is a 86 y.o. year old who fell and suffered a hip fracture. He was brought into the ER and then admitted and optimized and then elected for surgical intervention.    The risks benefits and alternatives were discussed with the patient including but not limited to the risks of nonoperative treatment, versus surgical intervention including infection, bleeding, nerve injury, malunion, nonunion, hardware prominence, hardware failure, need for hardware removal, blood clots, cardiopulmonary complications, morbidity, mortality, among others, and they were willing to proceed.    OPERATIVE PROCEDURE:  The patient was brought to the operating room and placed in the supine position.  Spinal anesthesia was administered, with a foley. He was placed on the fracture table.  Closed reduction was performed under C-arm guidance. The length of the femur was also measured using fluoroscopy. Time out was then performed after sterile prep and drape. He received preoperative antibiotics.  Incision was made proximal to the greater trochanter. A guidewire was placed in the appropriate position. Confirmation was made on AP and lateral views. The above-named nail was opened. I opened the proximal femur with a reamer. I then placed the nail by hand easily down. I did not need to ream the femur.  Once the nail was completely seated, I placed a guidepin into the femoral head into the center center position through a second incision.  I measured the length, and then  reamed the lateral cortex and up into the head. I then placed the helical blade. Slight compression was applied. Anatomic fixation achieved. Bone quality was poor.  I then secured the proximal interlock.  I then removed the instruments, and took final C-arm pictures AP and lateral the entire length of the leg. Anatomic reconstruction was achieved, and the wounds were irrigated copiously and closed with Vicryl  followed by staples and dry sterile dressing. Sponge and needle count were correct.   The patient was awakened and returned to PACU in stable and satisfactory condition. There no complications and the patient tolerated the procedure well.  He will be weightbearing as tolerated.    Lovell Sheehan

## 2020-05-12 NOTE — Anesthesia Procedure Notes (Signed)
Spinal  Patient location during procedure: OR Start time: 05/12/2020 3:10 PM End time: 05/12/2020 3:21 PM Staffing Performed: anesthesiologist  Anesthesiologist: Molli Barrows, MD Preanesthetic Checklist Completed: patient identified, IV checked, site marked, risks and benefits discussed, surgical consent, monitors and equipment checked, pre-op evaluation and timeout performed Spinal Block Patient position: right lateral decubitus Prep: DuraPrep Patient monitoring: heart rate, cardiac monitor, continuous pulse ox and blood pressure Approach: midline Location: L3-4 Injection technique: single-shot Needle Needle type: Sprotte  Needle gauge: 24 G Needle length: 9 cm Assessment Sensory level: T4

## 2020-05-12 NOTE — Consult Note (Signed)
ORTHOPAEDIC CONSULTATION  REQUESTING PHYSICIAN: Mercy Riding, MD  Chief Complaint: right hip pain  HPI: [name] is a 86 y.o. male who complains of right hip pain after fall. Please see H&P and ED notes for details. Denies any numbness, tingling or constitutional symptoms.  Past Medical History:  Diagnosis Date  . Anemia   . Aortic aneurysm ([place])   . Aortic aneurysm ([place])   . Arthritis   . CKD (chronic kidney disease)    ALSO LEFT RENAL MASS  . Coronary artery disease   . Diverticulosis    WITH RUPTURE  . GERD (gastroesophageal reflux disease)   . History of hiatal hernia   . Hypercholesteremia   . Hypertension   . MGUS (monoclonal gammopathy of unknown significance)   . MGUS (monoclonal gammopathy of unknown significance)    Past Surgical History:  Procedure Laterality Date  . AORTIC VALVE REPLACEMENT    . bipass    . CARDIAC VALVE REPLACEMENT     pig valve  . CHOLECYSTECTOMY    . COLON SURGERY    . CORONARY ANGIOPLASTY    . CORONARY ARTERY BYPASS GRAFT    . ESOPHAGOGASTRODUODENOSCOPY (EGD) WITH PROPOFOL N/A 02/07/2015   Procedure: ESOPHAGOGASTRODUODENOSCOPY (EGD) WITH PROPOFOL;  Surgeon: Hulen Luster, MD;  Location: [facility] ENDOSCOPY;  Service: Gastroenterology;  Laterality: N/A;  . EYE SURGERY    . HERNIA REPAIR    . JOINT REPLACEMENT     left knee x4  . KNEE ARTHROPLASTY Right 04/02/2016   Procedure: COMPUTER ASSISTED TOTAL KNEE ARTHROPLASTY;  Surgeon: Dereck Leep, MD;  Location: [facility] ORS;  Service: Orthopedics;  Laterality: Right;   Social History   Socioeconomic History  . Marital status: Widowed    Spouse name: Not on file  . Number of children: Not on file  . Years of education: Not on file  . Highest education level: Not on file  Occupational History  . Not on file  Tobacco Use  . Smoking status: Former Research scientist (life sciences)  . Smokeless tobacco: Never Used  Substance and Sexual Activity  . Alcohol use: No  . Drug use: No  . Sexual activity: Not on file   Other Topics Concern  . Not on file  Social History Narrative  . Not on file   Social Determinants of Health   Financial Resource Strain:   . Difficulty of Paying Living Expenses: Not on file  Food Insecurity:   . Worried About Charity fundraiser in the Last Year: Not on file  . Ran Out of Food in the Last Year: Not on file  Transportation Needs:   . Lack of Transportation (Medical): Not on file  . Lack of Transportation (Non-Medical): Not on file  Physical Activity:   . Days of Exercise per Week: Not on file  . Minutes of Exercise per Session: Not on file  Stress:   . Feeling of Stress : Not on file  Social Connections:   . Frequency of Communication with Friends and Family: Not on file  . Frequency of Social Gatherings with Friends and Family: Not on file  . Attends Religious Services: Not on file  . Active Member of Clubs or Organizations: Not on file  . Attends Archivist Meetings: Not on file  . Marital Status: Not on file   Family History  Problem Relation Age of Onset  . Bladder Cancer Neg Hx   . Prostate cancer Neg Hx   . Kidney cancer Neg Hx  Allergies  Allergen Reactions  . Morphine Anaphylaxis  . Oxycodone Other (See Comments)    Hypotension and bradycardia  . Carvedilol     Dizziness and syncope   . Iodinated Diagnostic Agents     Other reaction(s): Asthenia (finding), Other (qualifier value) paralyzed legs   Prior to Admission medications   Medication Sig Start Date End Date Taking? Authorizing Provider  irbesartan (AVAPRO) 150 MG tablet Take 1 tablet by mouth 2 (two) times daily. 06/08/19  Yes [provider]  amLODipine (NORVASC) 5 MG tablet Take 5 mg by mouth daily.    [provider]  Melatonin 3 MG TABS Take 1.5 mg by mouth at bedtime as needed (sleep).    [provider]  pravastatin (PRAVACHOL) 20 MG tablet Take 20 mg by mouth every evening.    [provider]  sodium bicarbonate 650 MG tablet  Take 1,300 mg by mouth 2 (two) times daily.    [provider]   DG Chest 1 View  Result Date: 05/11/2020 CLINICAL DATA:  Fall RIGHT hip pain EXAM: CHEST  1 VIEW COMPARISON:  03/28/2010 FINDINGS: Trachea midline. Post median sternotomy for CABG and aortic valve replacement with stable cardiac enlargement. Linear opacities over the LEFT hemidiaphragm similar to previous imaging. No lobar level consolidation. No signs of pleural effusion. On limited assessment skeletal structures without acute process. IMPRESSION: Linear opacities over the LEFT hemidiaphragm similar to previous imaging may represent scarring or atelectasis. No acute cardiopulmonary disease. Electronically Signed   By: Zetta Bills M.D.   On: 05/11/2020 17:36   CT Head Wo Contrast  Result Date: 05/11/2020 CLINICAL DATA:  Fall EXAM: CT HEAD WITHOUT CONTRAST CT CERVICAL SPINE WITHOUT CONTRAST TECHNIQUE: Multidetector CT imaging of the head and cervical spine was performed following the standard protocol without intravenous contrast. Multiplanar CT image reconstructions of the cervical spine were also generated. COMPARISON:  CT brain 09/08/2009 FINDINGS: CT HEAD FINDINGS Brain: No acute territorial infarction, hemorrhage, or intracranial mass is visualized. Moderate atrophy. Mild hypodensity in the white matter consistent with chronic small vessel ischemic change. Nonenlarged ventricles. Vascular: No hyperdense vessels.  Carotid vascular calcification. Skull: Normal. Negative for fracture or focal lesion. Sinuses/Orbits: Mild mucosal thickening in the paranasal sinuses. Other: None CT CERVICAL SPINE FINDINGS Alignment: Facet alignment is maintained. Straightening of the cervical spine. Skull base and vertebrae: No acute fracture. No primary bone lesion or focal pathologic process. Soft tissues and spinal canal: No prevertebral fluid or swelling. No visible canal hematoma. Disc levels: Advanced diffuse degenerative changes throughout the  cervical spine from C3 through C7 with diffuse disc space narrowing and osteophyte. Multiple level facet degenerative change with multiple level bilateral foraminal stenosis. Upper chest: Negative. Other: None IMPRESSION: 1. No CT evidence for acute intracranial abnormality. Atrophy and mild chronic small vessel ischemic changes of the white matter. 2. Straightening of the cervical spine with advanced degenerative changes. No acute osseous abnormality. Electronically Signed   By: Donavan Foil M.D.   On: 05/11/2020 17:49   CT Cervical Spine Wo Contrast  Result Date: 05/11/2020 CLINICAL DATA:  Fall EXAM: CT HEAD WITHOUT CONTRAST CT CERVICAL SPINE WITHOUT CONTRAST TECHNIQUE: Multidetector CT imaging of the head and cervical spine was performed following the standard protocol without intravenous contrast. Multiplanar CT image reconstructions of the cervical spine were also generated. COMPARISON:  CT brain 09/08/2009 FINDINGS: CT HEAD FINDINGS Brain: No acute territorial infarction, hemorrhage, or intracranial mass is visualized. Moderate atrophy. Mild hypodensity in the white matter consistent  with chronic small vessel ischemic change. Nonenlarged ventricles. Vascular: No hyperdense vessels.  Carotid vascular calcification. Skull: Normal. Negative for fracture or focal lesion. Sinuses/Orbits: Mild mucosal thickening in the paranasal sinuses. Other: None CT CERVICAL SPINE FINDINGS Alignment: Facet alignment is maintained. Straightening of the cervical spine. Skull base and vertebrae: No acute fracture. No primary bone lesion or focal pathologic process. Soft tissues and spinal canal: No prevertebral fluid or swelling. No visible canal hematoma. Disc levels: Advanced diffuse degenerative changes throughout the cervical spine from C3 through C7 with diffuse disc space narrowing and osteophyte. Multiple level facet degenerative change with multiple level bilateral foraminal stenosis. Upper chest: Negative. Other: None  IMPRESSION: 1. No CT evidence for acute intracranial abnormality. Atrophy and mild chronic small vessel ischemic changes of the white matter. 2. Straightening of the cervical spine with advanced degenerative changes. No acute osseous abnormality. Electronically Signed   By: Donavan Foil M.D.   On: 05/11/2020 17:49   DG Hip Unilat W or Wo Pelvis 2-3 Views Right  Result Date: 05/11/2020 CLINICAL DATA:  Right hip pain. Tripped over a case of water. Fall onto right leg. EXAM: DG HIP (WITH OR WITHOUT PELVIS) 2-3V RIGHT COMPARISON:  None. FINDINGS: Displaced intertrochanteric hip fracture with mild proximal migration of the femoral shaft. Mild apex anterior and lateral angulation. The femoral head remains seated. Pubic rami are intact. Bones are diffusely under mineralized. Degenerative change of the sacroiliac joints. Pubic symphysis is congruent. Surgical clips and enteric sutures noted in the lower abdomen. IMPRESSION: Displaced angulated intertrochanteric right hip fracture. Electronically Signed   By: Keith Rake M.D.   On: 05/11/2020 17:40    Positive ROS: All other systems have been reviewed and were otherwise negative with the exception of those mentioned in the HPI and as above.  Physical Exam: General: Alert, no acute distress Cardiovascular: No pedal edema Respiratory: No cyanosis, no use of accessory musculature GI: No organomegaly, abdomen is soft and non-tender Skin: No lesions in the area of chief complaint Neurologic: Sensation intact distally Psychiatric: Patient is competent for consent with normal mood and affect Lymphatic: No axillary or cervical lymphadenopathy  MUSCULOSKELETAL: right leg short, externally rotated. Compartments soft. Good cap refill. Motor and sensory intact distally.  Assessment: Right hip fracture, closed, displaced  Plan: Plan a right hip trochanteric femoral nail.  The diagnosis, risks, benefits and alternatives to treatment are all discussed in  detail with the patient and family. Risks include but are not limited to bleeding, infection, deep vein thrombosis, pulmonary embolism, nerve or vascular injury, non-union, repeat operation, persistent pain, weakness, stiffness and death. He understands and is eager to proceed.     Lovell Sheehan, MD    05/12/2020 11:07 AM

## 2020-05-12 NOTE — Anesthesia Preprocedure Evaluation (Addendum)
Anesthesia Evaluation  Patient identified by MRN, date of birth, ID band Patient awake    Reviewed: Allergy & Precautions, H&P , NPO status , Patient's Chart, lab work & pertinent test results, reviewed documented beta blocker date and time   History of Anesthesia Complications Negative for: history of anesthetic complications  Airway Mallampati: II  TM Distance: >3 FB Neck ROM: full    Dental  (+) Poor Dentition, Teeth Intact, Dental Advisory Given   Pulmonary neg pulmonary ROS, neg sleep apnea, neg COPD, Patient abstained from smoking.Not current smoker, former smoker,    Pulmonary exam normal        Cardiovascular Exercise Tolerance: Good METShypertension, + CAD, + CABG, + Peripheral Vascular Disease and +CHF  Normal cardiovascular exam+ Valvular Problems/Murmurs MR  Rhythm:regular Rate:Normal  Stress test 2021: Indeterminate treadmill EKG due to baseline EKG changes  Mild segmental LV systolic dysfunction with ejection fraction of 42%  Moderate fixed inferior myocardial fusion defect consistent previous  infarct and/or scar without evidence of myocardial ischemia    TTE 2021: MILD SEGMENTAL LV SYSTOLIC DYSFUNCTION WITH AN ESTIMATED EF = 40-45 %  MILD RV SYSTOLIC DYSFUNCTION (See above)  MODERATE TRICUSPID VALVE INSUFFICIENCY  MILD-TO-MODERATE MITRAL VALVE INSUFFICIENCY  TRACE AORTIC VALVE INSUFFICIENCY  NO VALVULAR STENOSIS  MODERATE RV ENLARGEMENT  MODERATE RA ENLARGEMENT  MILD LV ENLARGEMENT  MILD LA ENLARGEMENT  MILDLY DILATED AORTIC ROOT MEASURING UP TO 4.0 cm  NORMAL BIOPROSTHETIC AORTIC VALVE FUNCTION     Neuro/Psych negative neurological ROS  negative psych ROS   GI/Hepatic Neg liver ROS, hiatal hernia, GERD  Medicated,  Endo/Other  negative endocrine ROS  Renal/GU CRFRenal disease  negative genitourinary   Musculoskeletal   Abdominal   Peds  Hematology  (+) anemia ,   Anesthesia Other  Findings Past Medical History:   Hypertension                                                 Hypercholesteremia                                           MGUS (monoclonal gammopathy of unknown signifi*              GERD (gastroesophageal reflux disease)                       Arthritis                                                    Anemia                                                       Aortic aneurysm ([facility])                                        Aortic  aneurysm ([facility])                                        Coronary artery disease                                      Diverticulosis                                                 Comment:WITH RUPTURE   History of hiatal hernia                                     CKD (chronic kidney disease)                                   Comment:ALSO LEFT RENAL MASS   MGUS (monoclonal gammopathy of unknown signifi*            Past Surgical History:   CARDIAC VALVE REPLACEMENT                                       Comment:pig valve   CORONARY ANGIOPLASTY                                          bipass                                                        CHOLECYSTECTOMY                                               ESOPHAGOGASTRODUODENOSCOPY (EGD) WITH PROPOFOL  N/A 02/07/2015      Comment:Procedure: ESOPHAGOGASTRODUODENOSCOPY (EGD)               WITH PROPOFOL;  Surgeon: Hulen Luster, MD;                Location: [facility] ENDOSCOPY;  Service:               Gastroenterology;  Laterality: N/A;   CORONARY ARTERY BYPASS GRAFT                                  AORTIC VALVE REPLACEMENT                                      COLON SURGERY  HERNIA REPAIR                                                 JOINT REPLACEMENT                                               Comment:left knee x4   EYE SURGERY                                                 BMI    Body Mass Index   24.00 kg/m 2      Reproductive/Obstetrics negative OB ROS                            Anesthesia Physical  Anesthesia Plan  ASA: III  Anesthesia Plan: General and Spinal   Post-op Pain Management:    Induction: Intravenous  PONV Risk Score and Plan: 2 and Ondansetron, Dexamethasone, Propofol infusion and TIVA  Airway Management Planned: Natural Airway  Additional Equipment: None  Intra-op Plan:   Post-operative Plan:   Informed Consent: I have reviewed the patients History and Physical, chart, labs and discussed the procedure including the risks, benefits and alternatives for the proposed anesthesia with the patient or authorized representative who has indicated his/her understanding and acceptance.     Dental Advisory Given  Plan Discussed with: CRNA  Anesthesia Plan Comments: (Patient not on anticoagulation. Discussed R/B/A of neuraxial anesthesia technique with patient: - rare risks of spinal/epidural hematoma, nerve damage, infection - Risk of PDPH - Risk of nausea and vomiting - Risk of conversion to general anesthesia and its associated risks, including sore throat, damage to lips/teeth/oropharynx, and rare risks such as cardiac and respiratory events.  Patient voiced understanding.)       Anesthesia Quick Evaluation

## 2020-05-12 NOTE — Progress Notes (Signed)
PROGRESS NOTE  [name] ZHG:[id number] DOB: 30-Sep-1930   PCP: Baxter Hire, MD  Patient is from: Home.  Independently ambulates at baseline.  DOA: 05/11/2020 LOS: 1  Brief Narrative / Interim history: 86 year old male with history of CAD/CABG, AV replacement, AAA, CKD, hyponatremia, MGUS, osteoarthritis and hypertension presenting with right hip pain after he slipped and fell onto the right hip at the grocery store.  No prodrome leading to this.   In ED, vital signs within normal.  NA 127 (baseline). Cr 1.68 (about baseline but no recent value).  BUN 33.  Hgb 12.  COVID-19 PCR negative.  CT head and neck without acute finding.  Right hip x-ray with displaced and angulated intertrochanteric right hip fracture.  Orthopedic surgery consulted, and patient was admitted.  Subjective: Seen and examined earlier this morning.  No major events overnight or this morning.  Patient endorses significant pain in the right hip.  Pain 15/10 with movement but improves with lying quietly.  However, lying in bed also causes him back pain.  He has anaphylaxis to opiates.  Only able to use Ultracet.  No other complaints.  He denies chest pain, dyspnea, palpitation, GI or UTI symptoms.  Objective: Vitals:   05/11/20 2153 05/12/20 0218 05/12/20 0504 05/12/20 1056  BP: (!) 185/82 (!) 160/81 (!) 151/87 (!) 142/82  Pulse: 65 71 68 69  Resp: 17 17 17 16   Temp: 97.8 F (36.6 C) 98 F (36.7 C) 98 F (36.7 C) 98.4 F (36.9 C)  TempSrc: Oral Oral Oral   SpO2: 100% 97% 98% 95%  Weight:        Intake/Output Summary (Last 24 hours) at 05/12/2020 1104 Last data filed at 05/11/2020 2322 Gross per 24 hour  Intake 2000 ml  Output --  Net 2000 ml   Filed Weights   05/11/20 1655  Weight: 77.1 kg    Examination:   GENERAL: No apparent distress.  Nontoxic. HEENT: MMM.  Vision and hearing grossly intact.  NECK: Supple.  No apparent JVD.  RESP: On RA.  No IWOB.  Fair aeration bilaterally. CVS:  RRR.  Heart sounds normal.  ABD/GI/GU: BS+. Abd soft, NTND.  MSK/EXT: No apparent deformity.  No edema. SKIN: no apparent skin lesion or wound NEURO: Awake, alert and oriented appropriately.  No apparent focal neuro deficit. PSYCH: Calm. Normal affect.  Procedures:  None  Microbiology summarized: COVID-19 PCR negative.  Assessment & Plan: Accidental fall at the grocery store Displaced intertrochanteric right hip fracture -Take surgery planning right hip trochanteric femoral nail -Limited option for pain control due to history of anaphylaxis with opiates-continue Ultracet -In regards to cardiovascular risk, patient has no cardiopulmonary symptoms.  No exertional chest pain or dyspnea.  Had extensive evaluation at [facility] earlier this month which are reassuring.  -Continue IV fluid while n.p.o.  Chronic hyponatremia: Na 127 (about baseline).  Unclear etiology.  He is on ARB which might contribute but not to this extent -check urine chemistry and TSH -Change LR to normal saline  History of CAD/remote CABG with AV replacement in 2011-no cardiopulmonary symptoms.  Not on blood thinners.  And had a stress test on 04/19/2020 that revealed fixed defect from previous infarct but no reversible ischemic event. -Continue home cardiac medications after surgery  History of ischemic cardiomyopathy: Echo on 04/18/2020 with EF of 40 to 45%, moderate TVR, moderate RVE and moderate LAE and normal bioprosthetic aortic valve function.  Not on diuretics.  Appears euvolemic.  Cardiopulmonary symptoms. -Monitor fluid status -  Would be cautious with IV fluids during surgery.  AAA?  I could not find report of this on his abdominal imaging in his chart okay everywhere.. -Optimize blood pressure control.  CKD-3B: Cr 1.6 (baseline)> 1.6 (admit)> 1.49 -Continue monitoring  Essential hypertension: BP slightly elevated partly due to pain and IV fluid. -Home meds when able to take p.o. -Decrease IV fluid  rate.  Hyperlipidemia -Continue pravastatin.  Body mass index is 23.71 kg/m.         DVT prophylaxis:  SCDs Start: 05/11/20 2148  Code Status: Full code Family Communication: None at bedside. Status is: Inpatient  Remains inpatient appropriate because:IV treatments appropriate due to intensity of illness or inability to take PO and Inpatient level of care appropriate due to severity of illness.  Patient needs surgical repair of a fracture.   Dispo: The patient is from: Home              Anticipated d/c is to: To be determined              Anticipated d/c date is: 3 days              Patient currently is not medically stable to d/c.       Consultants:  Orthopedic surgery   Sch Meds:  Scheduled Meds: . amLODipine  5 mg Oral Daily  . Chlorhexidine Gluconate Cloth  6 each Topical Daily  . irbesartan  150 mg Oral BID  . pravastatin  20 mg Oral QPM  . sodium bicarbonate  1,300 mg Oral BID  . tranexamic acid (CYKLOKAPRON) topical - INTRAOP  2,000 mg Topical Once   Continuous Infusions: . lactated ringers 100 mL/hr at 05/11/20 2322   PRN Meds:.polyethylene glycol, traMADol-acetaminophen, zolpidem  Antimicrobials: Anti-infectives (From admission, onward)   None       I have personally reviewed the following labs and images: CBC: Recent Labs  Lab 05/11/20 1702 05/12/20 0521  WBC 7.4 10.9*  NEUTROABS 4.2  --   HGB 12.1* 11.8*  HCT 34.7* 32.2*  MCV 93.5 89.7  PLT 179 179   BMP &GFR Recent Labs  Lab 05/11/20 1702 05/12/20 0521  NA 127* 127*  K 4.5 4.5  CL 95* 98  CO2 22 21*  GLUCOSE 108* 123*  BUN 33* 27*  CREATININE 1.68* 1.49*  CALCIUM 8.7* 8.5*   CrCl cannot be calculated (Unknown ideal weight.). Liver & Pancreas: Recent Labs  Lab 05/11/20 1702  AST 22  ALT 13  ALKPHOS 61  BILITOT 1.1  PROT 6.7  ALBUMIN 3.8   No results for input(s): LIPASE, AMYLASE in the last 168 hours. No results for input(s): AMMONIA in the last 168  hours. Diabetic: No results for input(s): HGBA1C in the last 72 hours. No results for input(s): GLUCAP in the last 168 hours. Cardiac Enzymes: No results for input(s): CKTOTAL, CKMB, CKMBINDEX, TROPONINI in the last 168 hours. No results for input(s): PROBNP in the last 8760 hours. Coagulation Profile: Recent Labs  Lab 05/12/20 0521  INR 1.1   Thyroid Function Tests: Recent Labs    05/12/20 0521  TSH 3.808   Lipid Profile: No results for input(s): CHOL, HDL, LDLCALC, TRIG, CHOLHDL, LDLDIRECT in the last 72 hours. Anemia Panel: No results for input(s): VITAMINB12, FOLATE, FERRITIN, TIBC, IRON, RETICCTPCT in the last 72 hours. Urine analysis:    Component Value Date/Time   COLORURINE STRAW (A) 03/21/2016 1507   APPEARANCEUR CLEAR (A) 03/21/2016 1507   APPEARANCEUR Clear 11/22/2015 0950  LABSPEC 1.005 03/21/2016 1507   PHURINE 7.0 03/21/2016 1507   GLUCOSEU NEGATIVE 03/21/2016 1507   HGBUR NEGATIVE 03/21/2016 1507   BILIRUBINUR NEGATIVE 03/21/2016 1507   BILIRUBINUR Negative 11/22/2015 [place] 03/21/2016 1507   PROTEINUR NEGATIVE 03/21/2016 1507   UROBILINOGEN 1.0 02/16/2010 1336   NITRITE NEGATIVE 03/21/2016 1507   LEUKOCYTESUR NEGATIVE 03/21/2016 1507   LEUKOCYTESUR Negative 11/22/2015 0950   Sepsis Labs: Invalid input(s): PROCALCITONIN, [place]  Microbiology: Recent Results (from the past 240 hour(s))  SARS Coronavirus 2 by RT PCR (hospital order, performed in [facility] hospital lab) Nasopharyngeal Nasopharyngeal Swab     Status: None   Collection Time: 05/11/20  5:02 PM   Specimen: Nasopharyngeal Swab  Result Value Ref Range Status   SARS Coronavirus 2 NEGATIVE NEGATIVE Final    Comment: (NOTE) SARS-CoV-2 target nucleic acids are NOT DETECTED.  The SARS-CoV-2 RNA is generally detectable in upper and lower respiratory specimens during the acute phase of infection. The lowest concentration of SARS-CoV-2 viral copies this assay can  detect is 250 copies / mL. A negative result does not preclude SARS-CoV-2 infection and should not be used as the sole basis for treatment or other patient management decisions.  A negative result may occur with improper specimen collection / handling, submission of specimen other than nasopharyngeal swab, presence of viral mutation(s) within the areas targeted by this assay, and inadequate number of viral copies (<250 copies / mL). A negative result must be combined with clinical observations, patient history, and epidemiological information.  Fact Sheet for Patients:   StrictlyIdeas.no  Fact Sheet for Healthcare Providers: BankingDealers.co.za  This test is not yet approved or  cleared by the [place] FDA and has been authorized for detection and/or diagnosis of SARS-CoV-2 by FDA under an Emergency Use Authorization (EUA).  This EUA will remain in effect (meaning this test can be used) for the duration of the COVID-19 declaration under Section 564(b)(1) of the Act, 21 U.S.C. section 360bbb-3(b)(1), unless the authorization is terminated or revoked sooner.  Performed at [facility], [address], [place], [place] [address]   Surgical pcr screen     Status: None   Collection Time: 05/11/20 11:30 PM   Specimen: Nasal Mucosa; Nasal Swab  Result Value Ref Range Status   MRSA, PCR NEGATIVE NEGATIVE Final   Staphylococcus aureus NEGATIVE NEGATIVE Final    Comment: (NOTE) The Xpert SA Assay (FDA approved for NASAL specimens in patients 38 years of age and older), is one component of a comprehensive surveillance program. It is not intended to diagnose infection nor to guide or monitor treatment. Performed at [facility], [address] [place], [place] [address]     Radiology Studies: DG Chest 1 View  Result Date: 05/11/2020 CLINICAL DATA:  Fall RIGHT hip pain EXAM: CHEST  1 VIEW COMPARISON:  03/28/2010  FINDINGS: Trachea midline. Post median sternotomy for CABG and aortic valve replacement with stable cardiac enlargement. Linear opacities over the LEFT hemidiaphragm similar to previous imaging. No lobar level consolidation. No signs of pleural effusion. On limited assessment skeletal structures without acute process. IMPRESSION: Linear opacities over the LEFT hemidiaphragm similar to previous imaging may represent scarring or atelectasis. No acute cardiopulmonary disease. Electronically Signed   By: Zetta Bills M.D.   On: 05/11/2020 17:36   CT Head Wo Contrast  Result Date: 05/11/2020 CLINICAL DATA:  Fall EXAM: CT HEAD WITHOUT CONTRAST CT CERVICAL SPINE WITHOUT CONTRAST TECHNIQUE: Multidetector CT imaging of the  head and cervical spine was performed following the standard protocol without intravenous contrast. Multiplanar CT image reconstructions of the cervical spine were also generated. COMPARISON:  CT brain 09/08/2009 FINDINGS: CT HEAD FINDINGS Brain: No acute territorial infarction, hemorrhage, or intracranial mass is visualized. Moderate atrophy. Mild hypodensity in the white matter consistent with chronic small vessel ischemic change. Nonenlarged ventricles. Vascular: No hyperdense vessels.  Carotid vascular calcification. Skull: Normal. Negative for fracture or focal lesion. Sinuses/Orbits: Mild mucosal thickening in the paranasal sinuses. Other: None CT CERVICAL SPINE FINDINGS Alignment: Facet alignment is maintained. Straightening of the cervical spine. Skull base and vertebrae: No acute fracture. No primary bone lesion or focal pathologic process. Soft tissues and spinal canal: No prevertebral fluid or swelling. No visible canal hematoma. Disc levels: Advanced diffuse degenerative changes throughout the cervical spine from C3 through C7 with diffuse disc space narrowing and osteophyte. Multiple level facet degenerative change with multiple level bilateral foraminal stenosis. Upper chest: Negative.  Other: None IMPRESSION: 1. No CT evidence for acute intracranial abnormality. Atrophy and mild chronic small vessel ischemic changes of the white matter. 2. Straightening of the cervical spine with advanced degenerative changes. No acute osseous abnormality. Electronically Signed   By: Donavan Foil M.D.   On: 05/11/2020 17:49   CT Cervical Spine Wo Contrast  Result Date: 05/11/2020 CLINICAL DATA:  Fall EXAM: CT HEAD WITHOUT CONTRAST CT CERVICAL SPINE WITHOUT CONTRAST TECHNIQUE: Multidetector CT imaging of the head and cervical spine was performed following the standard protocol without intravenous contrast. Multiplanar CT image reconstructions of the cervical spine were also generated. COMPARISON:  CT brain 09/08/2009 FINDINGS: CT HEAD FINDINGS Brain: No acute territorial infarction, hemorrhage, or intracranial mass is visualized. Moderate atrophy. Mild hypodensity in the white matter consistent with chronic small vessel ischemic change. Nonenlarged ventricles. Vascular: No hyperdense vessels.  Carotid vascular calcification. Skull: Normal. Negative for fracture or focal lesion. Sinuses/Orbits: Mild mucosal thickening in the paranasal sinuses. Other: None CT CERVICAL SPINE FINDINGS Alignment: Facet alignment is maintained. Straightening of the cervical spine. Skull base and vertebrae: No acute fracture. No primary bone lesion or focal pathologic process. Soft tissues and spinal canal: No prevertebral fluid or swelling. No visible canal hematoma. Disc levels: Advanced diffuse degenerative changes throughout the cervical spine from C3 through C7 with diffuse disc space narrowing and osteophyte. Multiple level facet degenerative change with multiple level bilateral foraminal stenosis. Upper chest: Negative. Other: None IMPRESSION: 1. No CT evidence for acute intracranial abnormality. Atrophy and mild chronic small vessel ischemic changes of the white matter. 2. Straightening of the cervical spine with advanced  degenerative changes. No acute osseous abnormality. Electronically Signed   By: Donavan Foil M.D.   On: 05/11/2020 17:49   DG Hip Unilat W or Wo Pelvis 2-3 Views Right  Result Date: 05/11/2020 CLINICAL DATA:  Right hip pain. Tripped over a case of water. Fall onto right leg. EXAM: DG HIP (WITH OR WITHOUT PELVIS) 2-3V RIGHT COMPARISON:  None. FINDINGS: Displaced intertrochanteric hip fracture with mild proximal migration of the femoral shaft. Mild apex anterior and lateral angulation. The femoral head remains seated. Pubic rami are intact. Bones are diffusely under mineralized. Degenerative change of the sacroiliac joints. Pubic symphysis is congruent. Surgical clips and enteric sutures noted in the lower abdomen. IMPRESSION: Displaced angulated intertrochanteric right hip fracture. Electronically Signed   By: Keith Rake M.D.   On: 05/11/2020 17:40     Kristjan Derner T. [place]  If 7PM-7AM, please contact night-coverage [email or web address] 05/12/2020,  11:04 AM

## 2020-05-12 NOTE — Transfer of Care (Signed)
Immediate Anesthesia Transfer of Care Note  Patient: [name]  Procedure(s) Performed: INTRAMEDULLARY (IM) NAIL INTERTROCHANTRIC (Right )  Patient Location: PACU  Anesthesia Type:Spinal  Level of Consciousness: sedated and patient cooperative  Airway & Oxygen Therapy: Patient Spontanous Breathing and Patient connected to face mask oxygen  Post-op Assessment: Report given to RN and Post -op Vital signs reviewed and stable  Post vital signs: Reviewed and stable  Last Vitals:  Vitals Value Taken Time  BP 104/62 05/12/20 1632  Temp    Pulse 61 05/12/20 1635  Resp 16 05/12/20 1635  SpO2 98 % 05/12/20 1635  Vitals shown include unvalidated device data.  Last Pain:  Vitals:   05/12/20 1039  TempSrc:   PainSc: 5          Complications: No complications documented.

## 2020-05-13 ENCOUNTER — Encounter: Payer: Self-pay | Admitting: Orthopedic Surgery

## 2020-05-13 DIAGNOSIS — E875 Hyperkalemia: Secondary | ICD-10-CM

## 2020-05-13 DIAGNOSIS — N179 Acute kidney failure, unspecified: Secondary | ICD-10-CM

## 2020-05-13 LAB — CBC
HCT: 30.2 % — ABNORMAL LOW (ref 39.0–52.0)
Hemoglobin: 10.4 g/dL — ABNORMAL LOW (ref 13.0–17.0)
MCH: 32.8 pg (ref 26.0–34.0)
MCHC: 34.4 g/dL (ref 30.0–36.0)
MCV: 95.3 fL (ref 80.0–100.0)
Platelets: 145 10*3/uL — ABNORMAL LOW (ref 150–400)
RBC: 3.17 MIL/uL — ABNORMAL LOW (ref 4.22–5.81)
RDW: 13.5 % (ref 11.5–15.5)
WBC: 8.6 10*3/uL (ref 4.0–10.5)
nRBC: 0 % (ref 0.0–0.2)

## 2020-05-13 LAB — RENAL FUNCTION PANEL
Albumin: 3 g/dL — ABNORMAL LOW (ref 3.5–5.0)
Anion gap: 6 (ref 5–15)
BUN: 30 mg/dL — ABNORMAL HIGH (ref 8–23)
CO2: 24 mmol/L (ref 22–32)
Calcium: 8 mg/dL — ABNORMAL LOW (ref 8.9–10.3)
Chloride: 96 mmol/L — ABNORMAL LOW (ref 98–111)
Creatinine, Ser: 2 mg/dL — ABNORMAL HIGH (ref 0.61–1.24)
GFR calc Af Amer: 34 mL/min — ABNORMAL LOW (ref >60)
GFR calc non Af Amer: 29 mL/min — ABNORMAL LOW (ref >60)
Glucose, Bld: 113 mg/dL — ABNORMAL HIGH (ref 70–99)
Phosphorus: 4.3 mg/dL (ref 2.5–4.6)
Potassium: 6.1 mmol/L — ABNORMAL HIGH (ref 3.5–5.1)
Sodium: 126 mmol/L — ABNORMAL LOW (ref 135–145)

## 2020-05-13 LAB — BASIC METABOLIC PANEL
Anion gap: 9 (ref 5–15)
BUN: 30 mg/dL — ABNORMAL HIGH (ref 8–23)
CO2: 22 mmol/L (ref 22–32)
Calcium: 7.9 mg/dL — ABNORMAL LOW (ref 8.9–10.3)
Chloride: 94 mmol/L — ABNORMAL LOW (ref 98–111)
Creatinine, Ser: 1.97 mg/dL — ABNORMAL HIGH (ref 0.61–1.24)
GFR calc Af Amer: 34 mL/min — ABNORMAL LOW (ref >60)
GFR calc non Af Amer: 30 mL/min — ABNORMAL LOW (ref >60)
Glucose, Bld: 104 mg/dL — ABNORMAL HIGH (ref 70–99)
Potassium: 6.1 mmol/L — ABNORMAL HIGH (ref 3.5–5.1)
Sodium: 125 mmol/L — ABNORMAL LOW (ref 135–145)

## 2020-05-13 LAB — MAGNESIUM: Magnesium: 2 mg/dL (ref 1.7–2.4)

## 2020-05-13 MED ORDER — ENOXAPARIN SODIUM 30 MG/0.3ML ~~LOC~~ SOLN
30.0000 mg | SUBCUTANEOUS | Status: DC
  Administered 2020-05-13: 30 mg via SUBCUTANEOUS
  Filled 2020-05-13: qty 0.3

## 2020-05-13 MED ORDER — AMLODIPINE BESYLATE 10 MG PO TABS
10.0000 mg | ORAL_TABLET | Freq: Every day | ORAL | Status: DC
  Administered 2020-05-13 – 2020-05-17 (×5): 10 mg via ORAL
  Filled 2020-05-13 (×5): qty 1

## 2020-05-13 MED ORDER — SODIUM CHLORIDE 0.9 % IV SOLN: INTRAVENOUS | Status: DC

## 2020-05-13 MED ORDER — SODIUM ZIRCONIUM CYCLOSILICATE 10 G PO PACK
10.0000 g | PACK | Freq: Once | ORAL | Status: AC
  Administered 2020-05-13: 10 g via ORAL
  Filled 2020-05-13: qty 1

## 2020-05-13 NOTE — Progress Notes (Signed)
Subjective:  Patient reports pain as mild to moderate.  Patient doing well.  Dizziness with ambulation this am.  Objective:   VITALS:   Vitals:   05/12/20 2222 05/13/20 0138 05/13/20 0740 05/13/20 1124  BP: 130/69 126/72 (!) 142/74 130/63  Pulse: 70 75 71 68  Resp: 17 17 17 16   Temp: 97.9 F (36.6 C) 98.3 F (36.8 C) 98.2 F (36.8 C) (!) 97.5 F (36.4 C)  TempSrc: Oral Oral  Oral  SpO2: 95% 97% 97% 96%  Weight:      Height:        PHYSICAL EXAM:  Neurologically intact ABD soft Neurovascular intact Sensation intact distally Intact pulses distally Dorsiflexion/Plantar flexion intact Incision: scant drainage No cellulitis present Compartment soft  LABS  Results for orders placed or performed during the hospital encounter of 05/11/20 (from the past 24 hour(s))  Renal function panel     Status: Abnormal   Collection Time: 05/13/20  5:16 AM  Result Value Ref Range   Sodium 126 (L) 135 - 145 mmol/L   Potassium 6.1 (H) 3.5 - 5.1 mmol/L   Chloride 96 (L) 98 - 111 mmol/L   CO2 24 22 - 32 mmol/L   Glucose, Bld 113 (H) 70 - 99 mg/dL   BUN 30 (H) 8 - 23 mg/dL   Creatinine, Ser 2.00 (H) 0.61 - 1.24 mg/dL   Calcium 8.0 (L) 8.9 - 10.3 mg/dL   Phosphorus 4.3 2.5 - 4.6 mg/dL   Albumin 3.0 (L) 3.5 - 5.0 g/dL   GFR calc non Af Amer 29 (L) >60 mL/min   GFR calc Af Amer 34 (L) >60 mL/min   Anion gap 6 5 - 15  Magnesium     Status: None   Collection Time: 05/13/20  5:16 AM  Result Value Ref Range   Magnesium 2.0 1.7 - 2.4 mg/dL  CBC     Status: Abnormal   Collection Time: 05/13/20  5:16 AM  Result Value Ref Range   WBC 8.6 4.0 - 10.5 K/uL   RBC 3.17 (L) 4.22 - 5.81 MIL/uL   Hemoglobin 10.4 (L) 13.0 - 17.0 g/dL   HCT 30.2 (L) 39 - 52 %   MCV 95.3 80.0 - 100.0 fL   MCH 32.8 26.0 - 34.0 pg   MCHC 34.4 30.0 - 36.0 g/dL   RDW 13.5 11.5 - 15.5 %   Platelets 145 (L) 150 - 400 K/uL   nRBC 0.0 0.0 - 0.2 %  Basic metabolic panel     Status: Abnormal   Collection Time:  05/13/20  5:16 AM  Result Value Ref Range   Sodium 125 (L) 135 - 145 mmol/L   Potassium 6.1 (H) 3.5 - 5.1 mmol/L   Chloride 94 (L) 98 - 111 mmol/L   CO2 22 22 - 32 mmol/L   Glucose, Bld 104 (H) 70 - 99 mg/dL   BUN 30 (H) 8 - 23 mg/dL   Creatinine, Ser 1.97 (H) 0.61 - 1.24 mg/dL   Calcium 7.9 (L) 8.9 - 10.3 mg/dL   GFR calc non Af Amer 30 (L) >60 mL/min   GFR calc Af Amer 34 (L) >60 mL/min   Anion gap 9 5 - 15    DG Chest 1 View  Result Date: 05/11/2020 CLINICAL DATA:  Fall RIGHT hip pain EXAM: CHEST  1 VIEW COMPARISON:  03/28/2010 FINDINGS: Trachea midline. Post median sternotomy for CABG and aortic valve replacement with stable cardiac enlargement. Linear opacities over the LEFT hemidiaphragm similar to  previous imaging. No lobar level consolidation. No signs of pleural effusion. On limited assessment skeletal structures without acute process. IMPRESSION: Linear opacities over the LEFT hemidiaphragm similar to previous imaging may represent scarring or atelectasis. No acute cardiopulmonary disease. Electronically Signed   By: Zetta Bills M.D.   On: 05/11/2020 17:36   CT Head Wo Contrast  Result Date: 05/11/2020 CLINICAL DATA:  Fall EXAM: CT HEAD WITHOUT CONTRAST CT CERVICAL SPINE WITHOUT CONTRAST TECHNIQUE: Multidetector CT imaging of the head and cervical spine was performed following the standard protocol without intravenous contrast. Multiplanar CT image reconstructions of the cervical spine were also generated. COMPARISON:  CT brain 09/08/2009 FINDINGS: CT HEAD FINDINGS Brain: No acute territorial infarction, hemorrhage, or intracranial mass is visualized. Moderate atrophy. Mild hypodensity in the white matter consistent with chronic small vessel ischemic change. Nonenlarged ventricles. Vascular: No hyperdense vessels.  Carotid vascular calcification. Skull: Normal. Negative for fracture or focal lesion. Sinuses/Orbits: Mild mucosal thickening in the paranasal sinuses. Other: None CT  CERVICAL SPINE FINDINGS Alignment: Facet alignment is maintained. Straightening of the cervical spine. Skull base and vertebrae: No acute fracture. No primary bone lesion or focal pathologic process. Soft tissues and spinal canal: No prevertebral fluid or swelling. No visible canal hematoma. Disc levels: Advanced diffuse degenerative changes throughout the cervical spine from C3 through C7 with diffuse disc space narrowing and osteophyte. Multiple level facet degenerative change with multiple level bilateral foraminal stenosis. Upper chest: Negative. Other: None IMPRESSION: 1. No CT evidence for acute intracranial abnormality. Atrophy and mild chronic small vessel ischemic changes of the white matter. 2. Straightening of the cervical spine with advanced degenerative changes. No acute osseous abnormality. Electronically Signed   By: Donavan Foil M.D.   On: 05/11/2020 17:49   CT Cervical Spine Wo Contrast  Result Date: 05/11/2020 CLINICAL DATA:  Fall EXAM: CT HEAD WITHOUT CONTRAST CT CERVICAL SPINE WITHOUT CONTRAST TECHNIQUE: Multidetector CT imaging of the head and cervical spine was performed following the standard protocol without intravenous contrast. Multiplanar CT image reconstructions of the cervical spine were also generated. COMPARISON:  CT brain 09/08/2009 FINDINGS: CT HEAD FINDINGS Brain: No acute territorial infarction, hemorrhage, or intracranial mass is visualized. Moderate atrophy. Mild hypodensity in the white matter consistent with chronic small vessel ischemic change. Nonenlarged ventricles. Vascular: No hyperdense vessels.  Carotid vascular calcification. Skull: Normal. Negative for fracture or focal lesion. Sinuses/Orbits: Mild mucosal thickening in the paranasal sinuses. Other: None CT CERVICAL SPINE FINDINGS Alignment: Facet alignment is maintained. Straightening of the cervical spine. Skull base and vertebrae: No acute fracture. No primary bone lesion or focal pathologic process. Soft  tissues and spinal canal: No prevertebral fluid or swelling. No visible canal hematoma. Disc levels: Advanced diffuse degenerative changes throughout the cervical spine from C3 through C7 with diffuse disc space narrowing and osteophyte. Multiple level facet degenerative change with multiple level bilateral foraminal stenosis. Upper chest: Negative. Other: None IMPRESSION: 1. No CT evidence for acute intracranial abnormality. Atrophy and mild chronic small vessel ischemic changes of the white matter. 2. Straightening of the cervical spine with advanced degenerative changes. No acute osseous abnormality. Electronically Signed   By: Donavan Foil M.D.   On: 05/11/2020 17:49   DG HIP OPERATIVE UNILAT W OR W/O PELVIS RIGHT  Result Date: 05/12/2020 CLINICAL DATA:  Intramedullary rod fixation of right femur. EXAM: OPERATIVE right HIP (WITH PELVIS IF PERFORMED) 3 VIEWS TECHNIQUE: Fluoroscopic spot image(s) were submitted for interpretation post-operatively. FLUOROSCOPY TIME:  7.0734 mGy. COMPARISON:  May 11, 2020. FINDINGS: Three intraoperative fluoroscopic images were obtained of the right femur. These images demonstrate surgical internal fixation of proximal intertrochanteric fracture and intramedullary rod fixation of the right femur. IMPRESSION: Fluoroscopic guidance provided during surgical internal fixation of proximal right femoral intertrochanteric fracture. Electronically Signed   By: Marijo Conception M.D.   On: 05/12/2020 16:32   DG Hip Unilat W or Wo Pelvis 2-3 Views Right  Result Date: 05/11/2020 CLINICAL DATA:  Right hip pain. Tripped over a case of water. Fall onto right leg. EXAM: DG HIP (WITH OR WITHOUT PELVIS) 2-3V RIGHT COMPARISON:  None. FINDINGS: Displaced intertrochanteric hip fracture with mild proximal migration of the femoral shaft. Mild apex anterior and lateral angulation. The femoral head remains seated. Pubic rami are intact. Bones are diffusely under mineralized. Degenerative change  of the sacroiliac joints. Pubic symphysis is congruent. Surgical clips and enteric sutures noted in the lower abdomen. IMPRESSION: Displaced angulated intertrochanteric right hip fracture. Electronically Signed   By: Keith Rake M.D.   On: 05/11/2020 17:40    Assessment/Plan: 1 Day Post-Op   Active Problems:   CKD (chronic kidney disease) stage 3, GFR 30-59 ml/min   Abdominal aortic aneurysm (AAA) without rupture (HCC)   Arthritis of knee, degenerative   Benign essential HTN   CAD in native artery   S/P total knee arthroplasty   MGUS (monoclonal gammopathy of unknown significance)   Hip fracture (HCC)   Advance diet Up with therapy Discharge to SNF when okay per medicine WBAT RLE Continue pain control and lovenox 30mg  daily X 2weeks Follow up with Dr. Harlow Mares in 2 weeks, call for appointment [phone] Follow up with    Carlynn Spry , PA-C 05/13/2020, 12:24 PM

## 2020-05-13 NOTE — Evaluation (Signed)
Physical Therapy Evaluation Patient Details Name: [name] MRN: [id number] DOB: 09/11/1931 Today's Date: 05/13/2020   History of Present Illness  86 y/o male who suffered a fall with R hip fx. Subsequent IM nailing 8/26.  Clinical Impression  Pt was initially very confident that he would be able to do well, be able to walker, etc and make quick transition home.   However he struggled with many aspects of the session from pain and from far more weakness than expected given his active and independent lifestyle.  Overall pt showed great willingness to participate but did not do overly well with all aspects of the session.  PT must recommend STR after today's performance but pt reports being very motivated to work hard with PT and be able to go home, states he does have friends and family that can offer consistent support.      Follow Up Recommendations SNF (pt adamant that he will work hard and be able to go home)    Equipment Recommendations  None recommended by PT    Recommendations for Other Services       Precautions / Restrictions Precautions Precautions: Fall Restrictions Weight Bearing Restrictions: Yes RLE Weight Bearing: Weight bearing as tolerated      Mobility  Bed Mobility Overal bed mobility: Needs Assistance Bed Mobility: Supine to Sit     Supine to sit: Min assist;Mod assist     General bed mobility comments: Pt very limited with ability to scoot hips in bed, showed great effort in assisting with PT using draw sheet.  Pt needed mod assist to guide R LE off EOB  Transfers Overall transfer level: Needs assistance Equipment used: Rolling walker (2 wheeled) Transfers: Sit to/from Stand Sit to Stand: Min assist;Mod assist         General transfer comment: Elevated bed 3-4 inches and gave plenty of cues, pt still struggled to get hips off EOB and was highly reliant on UEs, direct assist to rise and keep hips forward  Ambulation/Gait Ambulation/Gait  assistance: Min assist Gait Distance (Feet): 8 Feet Assistive device: Rolling walker (2 wheeled)       General Gait Details: Pt was eager and willing to try for long walk, however he struggled to confidently put more than a few pounds of pressure through R and then after minimal distance with great effort states "I have to stop, I'm going to pass out" and needed chair pulled up behind him promptly.  BP back in sitting was 90s/50s, HR and O2 stable   Stairs            Wheelchair Mobility    Modified Rankin (Stroke Patients Only)       Balance Overall balance assessment: Needs assistance   Sitting balance-Leahy Scale: Fair     Standing balance support: Bilateral upper extremity supported Standing balance-Leahy Scale: Fair Standing balance comment: generally unsteady, reliant on walker, unable to use R LE for much WBing, poor tolerance to standing                             Pertinent Vitals/Pain      Home Living Family/patient expects to be discharged to:: Private residence Living Arrangements: Alone Available Help at Discharge: Neighbor;Family Type of Home: House Home Access: Ramped entrance     Home Layout: One level Home Equipment: Walker - 2 wheels      Prior Function Level of Independence: Independent  Comments: Pt report that he rides stationary bike >1 mi/day, does yard Psychologist, prison and probation services, runs errands, cooks, Quarry manager        Extremity/Trunk Assessment   Upper Extremity Assessment Upper Extremity Assessment: Overall WFL for tasks assessed    Lower Extremity Assessment Lower Extremity Assessment: Overall WFL for tasks assessed;Generalized weakness (expected post-op/fx limitaitons in R hip)       Communication   Communication: No difficulties  Cognition Arousal/Alertness: Awake/alert Behavior During Therapy: WFL for tasks assessed/performed Overall Cognitive Status: Within Functional Limits for tasks  assessed                                        General Comments      Exercises General Exercises - Lower Extremity Ankle Circles/Pumps: AROM;10 reps Quad Sets: Strengthening;10 reps Short Arc Quad: 10 reps;AROM;AAROM (needs assist to initiate against gravity motion) Heel Slides: AAROM;10 reps (with lightly resisted leg extensions) Hip ABduction/ADduction: 10 reps;AAROM;AROM Straight Leg Raises:  (unable)   Assessment/Plan    PT Assessment Patient needs continued PT services  PT Problem List Decreased strength;Decreased range of motion;Decreased activity tolerance;Decreased balance;Decreased mobility;Decreased coordination;Decreased cognition;Decreased knowledge of use of DME;Decreased knowledge of precautions;Decreased safety awareness;Pain;Cardiopulmonary status limiting activity       PT Treatment Interventions DME instruction;Gait training;Functional mobility training;Therapeutic activities;Therapeutic exercise;Balance training;Neuromuscular re-education;Patient/family education    PT Goals (Current goals can be found in the Care Plan section)  Acute Rehab PT Goals Patient Stated Goal: go home PT Goal Formulation: With patient Time For Goal Achievement: 05/27/20 Potential to Achieve Goals: Fair    Frequency BID   Barriers to discharge        Co-evaluation               AM-PAC PT "6 Clicks" Mobility  Outcome Measure Help needed turning from your back to your side while in a flat bed without using bedrails?: A Little Help needed moving from lying on your back to sitting on the side of a flat bed without using bedrails?: A Lot Help needed moving to and from a bed to a chair (including a wheelchair)?: A Lot Help needed standing up from a chair using your arms (e.g., wheelchair or bedside chair)?: A Lot Help needed to walk in hospital room?: A Lot Help needed climbing 3-5 steps with a railing? : Total 6 Click Score: 12    End of Session  Equipment Utilized During Treatment: Gait belt Activity Tolerance: Patient limited by fatigue;Treatment limited secondary to medical complications (Comment) Patient left: in chair;with call bell/phone within reach;with nursing/sitter in room (on [facility] with nurse) Nurse Communication: Mobility status PT Visit Diagnosis: Muscle weakness (generalized) (M62.81);Difficulty in walking, not elsewhere classified (R26.2);Pain;Unsteadiness on feet (R26.81) Pain - Right/Left: Right Pain - part of body: Hip    Time: 4696-2952 PT Time Calculation (min) (ACUTE ONLY): 46 min   Charges:   PT Evaluation $PT Eval Moderate Complexity: 1 Mod PT Treatments $Therapeutic Exercise: 8-22 mins $Therapeutic Activity: 8-22 mins        Kreg Shropshire, DPT 05/13/2020, 2:32 PM

## 2020-05-13 NOTE — Care Management Important Message (Signed)
Important Message  Patient Details  Name: [name] MRN: [id number] Date of Birth: 06-Oct-1930   Medicare Important Message Given:  Yes  Initial Medicare IM given by Patient Access Associate on 05/13/2020 at 11:57am.   Dannette Barbara 05/13/2020, 12:28 PM

## 2020-05-13 NOTE — Progress Notes (Signed)
PHARMACIST - PHYSICIAN COMMUNICATION  CONCERNING:  Enoxaparin (Lovenox) for DVT Prophylaxis    RECOMMENDATION: Patient was prescribed enoxaparin 40mg  q24 hours for VTE prophylaxis.   Filed Weights   05/11/20 1655  Weight: 77.1 kg (170 lb)    Body mass index is 23.71 kg/m.  Estimated Creatinine Clearance: 27.7 mL/min (A) (by C-G formula based on SCr of 2 mg/dL (H)).  Patient is candidate for enoxaparin 30mg  every 24 hours based on CrCl <56ml/min or Weight <45kg  DESCRIPTION: Pharmacy has adjusted enoxaparin dose per [facility] policy.  Patient is now receiving enoxaparin 30 mg every 24 hours    Benita Gutter 05/13/2020 8:45 AM

## 2020-05-13 NOTE — Progress Notes (Signed)
PROGRESS NOTE  [name] SJG:[id number] DOB: 10-27-1930   PCP: Baxter Hire, MD  Patient is from: Home.  Independently ambulates at baseline.  DOA: 05/11/2020 LOS: 2  Brief Narrative / Interim history: 86 year old male with history of CAD/CABG, AV replacement, AAA, CKD, hyponatremia, MGUS, osteoarthritis and hypertension presenting with right hip pain after he slipped and fell onto the right hip at the grocery store.  No prodrome leading to this.   In ED, vital signs within normal.  NA 127 (baseline). Cr 1.68 (about baseline but no recent value).  BUN 33.  Hgb 12.  COVID-19 PCR negative.  CT head and neck without acute finding.  Right hip x-ray with displaced and angulated intertrochanteric right hip fracture.  Orthopedic surgery consulted, and patient was admitted.  Patient underwent right intramedullary nailing of intertrochanteric fracture on 8/26 by Dr. Harlow Mares.  Hospital course complicated by AKI and hyperkalemia likely from NSAID.  Subjective: Seen and examined earlier this morning.  No major events overnight of this morning.  Pain fairly controlled.  Denies chest pain, dyspnea, palpitation, GI or UTI symptoms.  Objective: Vitals:   05/12/20 2222 05/13/20 0138 05/13/20 0740 05/13/20 1124  BP: 130/69 126/72 (!) 142/74 130/63  Pulse: 70 75 71 68  Resp: 17 17 17 16   Temp: 97.9 F (36.6 C) 98.3 F (36.8 C) 98.2 F (36.8 C) (!) 97.5 F (36.4 C)  TempSrc: Oral Oral  Oral  SpO2: 95% 97% 97% 96%  Weight:      Height:        Intake/Output Summary (Last 24 hours) at 05/13/2020 1337 Last data filed at 05/13/2020 1312 Gross per 24 hour  Intake 2925.09 ml  Output 2625 ml  Net 300.09 ml   Filed Weights   05/11/20 1655  Weight: 77.1 kg    Examination:  GENERAL: No apparent distress.  Nontoxic. HEENT: MMM.  Vision and hearing grossly intact.  NECK: Supple.  No apparent JVD.  RESP: On RA.  No IWOB.  Fair aeration bilaterally. CVS:  RRR. Heart sounds normal.    ABD/GI/GU: BS+. Abd soft, NTND.  MSK/EXT:  Moves extremities. No apparent deformity. No edema.  SKIN: Dressing over right hip DCI. NEURO: Awake, alert and oriented appropriately.  No apparent focal neuro deficit. PSYCH: Calm. Normal affect.   Procedures:  8/26-right intramedullary nailing of intertrochanteric fracture by Dr. Harlow Mares  Microbiology summarized: COVID-19 PCR negative.  Assessment & Plan: Accidental fall at the grocery store Displaced intertrochanteric right hip fracture -IM nailing of intratrochanteric fracture as above -Continue Ultracet for pain control.  History of anaphylaxis with opiates -Discontinue Toradol in the setting of AKI -WBAT on RLE.  PT/OT.  Subcu Lovenox for VTE prophylaxis for 2 weeks  Chronic hyponatremia: Na 127 (about baseline)> 126.  Urine sodium and urine osmolality elevated suggestive for SIADH.  He is also on ARB which could contribute.  TSH within normal. -Change LR to NS.  Fluid restrictions once AKI improves. -P.o. sodium chloride 1 g twice daily -Hold home ARB for now -Continue monitoring  AKI on CKD-3B/azotemia: Baseline Cr 1.6> 1.6 (admit)> 1.49> 2.0.  BUN 30.  Patient received 2 doses of Toradol 15 mg after surgery which might have contributed.  He is also on Avapro -Stop Toradol and Avapro -IV NS at 75 cc an hour   Hyperkalemia: K6.1.  Likely due to renal failure. -Changed LR to NS.  -Stopped Avapro -P.o. Lokelma -Recheck BMP this afternoon  History of CAD/remote CABG with AV replacement in 2011-no  cardiopulmonary symptoms.  Not on blood thinners.  And had a stress test on 04/19/2020 that revealed fixed defect from previous infarct but no reversible ischemic event. -Continue home cardiac medications after surgery  History of ischemic cardiomyopathy: Echo on 04/18/2020 with EF of 40 to 45%, moderate TVR, moderate RVE and moderate LAE and normal bioprosthetic aortic valve function.  Not on diuretics.  Appears euvolemic.  Cardiopulmonary  symptoms. -Monitor fluid status while on IV fluid.  AAA?  I could not find report of this on his abdominal imaging in his chart okay everywhere.. -Optimize blood pressure control.  Essential hypertension: Normotensive. -Hold home Avapro in the setting of AKI -Increase amlodipine to 10 mg daily  Hyperlipidemia -Continue pravastatin.  Body mass index is 23.71 kg/m.         DVT prophylaxis:  enoxaparin (LOVENOX) injection 30 mg Start: 05/13/20 0900 SCDs Start: 05/12/20 1959 SCDs Start: 05/11/20 2148  Code Status: Full code Family Communication: None at bedside. Status is: Inpatient  Remains inpatient appropriate because:Persistent severe electrolyte disturbances, IV treatments appropriate due to intensity of illness or inability to take PO and Inpatient level of care appropriate due to severity of illness.  Patient with AKI and hyperkalemia.  On IV fluid.   Dispo: The patient is from: Home              Anticipated d/c is to: To be determined              Anticipated d/c date is: 3 days              Patient currently is not medically stable to d/c.       Consultants:  Orthopedic surgery   Sch Meds:  Scheduled Meds: . acetaminophen  500 mg Oral Q6H  . amLODipine  10 mg Oral Daily  . Chlorhexidine Gluconate Cloth  6 each Topical Daily  . docusate sodium  100 mg Oral BID  . enoxaparin (LOVENOX) injection  30 mg Subcutaneous Q24H  . pravastatin  20 mg Oral QPM  . sodium bicarbonate  1,300 mg Oral BID   Continuous Infusions: . sodium chloride 75 mL/hr at 05/13/20 0912   PRN Meds:.metoCLOPramide **OR** metoCLOPramide (REGLAN) injection, ondansetron **OR** ondansetron (ZOFRAN) IV, polyethylene glycol, traMADol-acetaminophen, zolpidem  Antimicrobials: Anti-infectives (From admission, onward)   Start     Dose/Rate Route Frequency Ordered Stop   05/12/20 2200  ceFAZolin (ANCEF) IVPB 2g/100 mL premix        2 g 200 mL/hr over 30 Minutes Intravenous Every 6 hours  05/12/20 1958 05/13/20 0945       I have personally reviewed the following labs and images: CBC: Recent Labs  Lab 05/11/20 1702 05/12/20 0521 05/13/20 0516  WBC 7.4 10.9* 8.6  NEUTROABS 4.2  --   --   HGB 12.1* 11.8* 10.4*  HCT 34.7* 32.2* 30.2*  MCV 93.5 89.7 95.3  PLT 179 179 145*   BMP &GFR Recent Labs  Lab 05/11/20 1702 05/12/20 0521 05/13/20 0516  NA 127* 127* 125*  126*  K 4.5 4.5 6.1*  6.1*  CL 95* 98 94*  96*  CO2 22 21* 22  24  GLUCOSE 108* 123* 104*  113*  BUN 33* 27* 30*  30*  CREATININE 1.68* 1.49* 1.97*  2.00*  CALCIUM 8.7* 8.5* 7.9*  8.0*  MG  --   --  2.0  PHOS  --   --  4.3   Estimated Creatinine Clearance: 27.7 mL/min (A) (by C-G formula based on  SCr of 2 mg/dL (H)). Liver & Pancreas: Recent Labs  Lab 05/11/20 1702 05/13/20 0516  AST 22  --   ALT 13  --   ALKPHOS 61  --   BILITOT 1.1  --   PROT 6.7  --   ALBUMIN 3.8 3.0*   No results for input(s): LIPASE, AMYLASE in the last 168 hours. No results for input(s): AMMONIA in the last 168 hours. Diabetic: No results for input(s): HGBA1C in the last 72 hours. No results for input(s): GLUCAP in the last 168 hours. Cardiac Enzymes: No results for input(s): CKTOTAL, CKMB, CKMBINDEX, TROPONINI in the last 168 hours. No results for input(s): PROBNP in the last 8760 hours. Coagulation Profile: Recent Labs  Lab 05/12/20 0521  INR 1.1   Thyroid Function Tests: Recent Labs    05/12/20 0521  TSH 3.808   Lipid Profile: No results for input(s): CHOL, HDL, LDLCALC, TRIG, CHOLHDL, LDLDIRECT in the last 72 hours. Anemia Panel: No results for input(s): VITAMINB12, FOLATE, FERRITIN, TIBC, IRON, RETICCTPCT in the last 72 hours. Urine analysis:    Component Value Date/Time   COLORURINE STRAW (A) 03/21/2016 1507   APPEARANCEUR CLEAR (A) 03/21/2016 1507   APPEARANCEUR Clear 11/22/2015 0950   LABSPEC 1.005 03/21/2016 1507   PHURINE 7.0 03/21/2016 1507   GLUCOSEU NEGATIVE 03/21/2016 1507    HGBUR NEGATIVE 03/21/2016 1507   BILIRUBINUR NEGATIVE 03/21/2016 1507   BILIRUBINUR Negative 11/22/2015 0950   KETONESUR NEGATIVE 03/21/2016 1507   PROTEINUR NEGATIVE 03/21/2016 1507   UROBILINOGEN 1.0 02/16/2010 1336   NITRITE NEGATIVE 03/21/2016 1507   LEUKOCYTESUR NEGATIVE 03/21/2016 1507   LEUKOCYTESUR Negative 11/22/2015 0950   Sepsis Labs: Invalid input(s): PROCALCITONIN, [place]  Microbiology: Recent Results (from the past 240 hour(s))  SARS Coronavirus 2 by RT PCR (hospital order, performed in [facility] hospital lab) Nasopharyngeal Nasopharyngeal Swab     Status: None   Collection Time: 05/11/20  5:02 PM   Specimen: Nasopharyngeal Swab  Result Value Ref Range Status   SARS Coronavirus 2 NEGATIVE NEGATIVE Final    Comment: (NOTE) SARS-CoV-2 target nucleic acids are NOT DETECTED.  The SARS-CoV-2 RNA is generally detectable in upper and lower respiratory specimens during the acute phase of infection. The lowest concentration of SARS-CoV-2 viral copies this assay can detect is 250 copies / mL. A negative result does not preclude SARS-CoV-2 infection and should not be used as the sole basis for treatment or other patient management decisions.  A negative result may occur with improper specimen collection / handling, submission of specimen other than nasopharyngeal swab, presence of viral mutation(s) within the areas targeted by this assay, and inadequate number of viral copies (<250 copies / mL). A negative result must be combined with clinical observations, patient history, and epidemiological information.  Fact Sheet for Patients:   StrictlyIdeas.no  Fact Sheet for Healthcare Providers: BankingDealers.co.za  This test is not yet approved or  cleared by the [place] FDA and has been authorized for detection and/or diagnosis of SARS-CoV-2 by FDA under an Emergency Use Authorization (EUA).  This EUA will remain in  effect (meaning this test can be used) for the duration of the COVID-19 declaration under Section 564(b)(1) of the Act, 21 U.S.C. section 360bbb-3(b)(1), unless the authorization is terminated or revoked sooner.  Performed at [facility], [address] [place], [place] [address]   Surgical pcr screen     Status: None   Collection Time: 05/11/20 11:30 PM   Specimen: Nasal Mucosa; Nasal Swab  Result Value Ref Range Status   MRSA, PCR NEGATIVE NEGATIVE Final   Staphylococcus aureus NEGATIVE NEGATIVE Final    Comment: (NOTE) The Xpert SA Assay (FDA approved for NASAL specimens in patients 13 years of age and older), is one component of a comprehensive surveillance program. It is not intended to diagnose infection nor to guide or monitor treatment. Performed at [facility], [address], [place], [place] [address]     Radiology Studies: DG HIP OPERATIVE [facility] OR W/O PELVIS RIGHT  Result Date: 05/12/2020 CLINICAL DATA:  Intramedullary rod fixation of right femur. EXAM: OPERATIVE right HIP (WITH PELVIS IF PERFORMED) 3 VIEWS TECHNIQUE: Fluoroscopic spot image(s) were submitted for interpretation post-operatively. FLUOROSCOPY TIME:  7.0734 mGy. COMPARISON:  May 11, 2020. FINDINGS: Three intraoperative fluoroscopic images were obtained of the right femur. These images demonstrate surgical internal fixation of proximal intertrochanteric fracture and intramedullary rod fixation of the right femur. IMPRESSION: Fluoroscopic guidance provided during surgical internal fixation of proximal right femoral intertrochanteric fracture. Electronically Signed   By: Marijo Conception M.D.   On: 05/12/2020 16:32     Rebecca Cairns T. Fairplay  If 7PM-7AM, please contact night-coverage [email or web address] 05/13/2020, 1:37 PM

## 2020-05-13 NOTE — Progress Notes (Signed)
Physical Therapy Treatment Patient Details Name: [name] MRN: [id number] DOB: 1931-01-17 Today's Date: 05/13/2020    History of Present Illness 86 y/o male who suffered a fall with R hip fx. Subsequent IM nailing 8/26.    PT Comments    Pt struggling with all aspects of mobility and showed very poor tolerance with R WBing and ability to take even very small shuffling steps despite great effort, heavy UE use and direct assist from PT.  Pt able to walk only about 4 ft (>5 minutes of effort to do so) before feeling exhausted and light headed and needing to sit (O2 and HR stable during this episode).  Pt tolerated supine exercises better than this AM but still is very weak and limited, especially in light of of prior level of function.  Pt will most certainly need STR as he clearly is unsafe to go home, even with consistent assistance.     Follow Up Recommendations  SNF (realizeing that going home would be exeedingly unsafe)     Equipment Recommendations  None recommended by PT    Recommendations for Other Services       Precautions / Restrictions Precautions Precautions: Fall Restrictions RLE Weight Bearing: Weight bearing as tolerated    Mobility  Bed Mobility Overal bed mobility: Needs Assistance Bed Mobility: Sit to Supine       Sit to supine: Max assist   General bed mobility comments: Pt needed heavy assist to get LEs back into bed from sitting  Transfers Overall transfer level: Needs assistance Equipment used: Rolling walker (2 wheeled) Transfers: Sit to/from Stand Sit to Stand: Min assist         General transfer comment: Pt eager to try and get up w/o assist.  He failed on the first attempt, but with cuing to reset feet and hands and keep weight forward he was slowly and with very labored effort gradually get himself up to standing EOB.    Ambulation/Gait Ambulation/Gait assistance: Max assist Gait Distance (Feet): 4 Feet Assistive device: Rolling  walker (2 wheeled)       General Gait Details: Pt again motivated to try and walk as much as possible but frankly he did poorly.  He was unable to take more than 1-2" steps with each attempt and even with many minutes of great effort and plenty of cuing he ulimately only managed a few labored feet before realizing he could do little more and with max assist we struggled to get hips back toward bed needed heavy effort from pt and PT to sit safely back to the recliner   Stairs             Wheelchair Mobility    Modified Rankin (Stroke Patients Only)       Balance Overall balance assessment: Needs assistance Sitting-balance support: Single extremity supported Sitting balance-Leahy Scale: Fair     Standing balance support: Bilateral upper extremity supported Standing balance-Leahy Scale: Poor Standing balance comment: generally unsteady, reliant on walker, unable to use R LE for much WBing, poor tolerance to standing                            Cognition Arousal/Alertness: Awake/alert Behavior During Therapy: WFL for tasks assessed/performed Overall Cognitive Status: Within Functional Limits for tasks assessed  Exercises General Exercises - Lower Extremity Ankle Circles/Pumps: AROM;10 reps Quad Sets: Strengthening;10 reps Short Arc Quad: 10 reps;AROM;AAROM Heel Slides: AAROM;15 reps (with resisted leg extensions) Hip ABduction/ADduction: 10 reps;AAROM;AROM    General Comments        Pertinent Vitals/Pain Pain Assessment: 0-10 Pain Score: 4     Home Living                      Prior Function            PT Goals (current goals can now be found in the care plan section) Progress towards PT goals: Progressing toward goals (very slow progress)    Frequency    BID      PT Plan Current plan remains appropriate    Co-evaluation              AM-PAC PT "6 Clicks" Mobility    Outcome Measure  Help needed turning from your back to your side while in a flat bed without using bedrails?: A Little Help needed moving from lying on your back to sitting on the side of a flat bed without using bedrails?: A Lot Help needed moving to and from a bed to a chair (including a wheelchair)?: A Lot Help needed standing up from a chair using your arms (e.g., wheelchair or bedside chair)?: A Lot Help needed to walk in hospital room?: Total Help needed climbing 3-5 steps with a railing? : Total 6 Click Score: 11    End of Session Equipment Utilized During Treatment: Gait belt Activity Tolerance: Patient limited by fatigue;Treatment limited secondary to medical complications (Comment) Patient left: with bed alarm set;with call bell/phone within reach Nurse Communication: Mobility status PT Visit Diagnosis: Muscle weakness (generalized) (M62.81);Difficulty in walking, not elsewhere classified (R26.2);Pain;Unsteadiness on feet (R26.81) Pain - Right/Left: Right Pain - part of body: Hip     Time: 5631-4970 PT Time Calculation (min) (ACUTE ONLY): 28 min  Charges:  $Therapeutic Exercise: 8-22 mins $Therapeutic Activity: 8-22 mins                     Kreg Shropshire, DPT 05/13/2020, 5:47 PM

## 2020-05-14 DIAGNOSIS — D696 Thrombocytopenia, unspecified: Secondary | ICD-10-CM

## 2020-05-14 LAB — CBC
HCT: 28.6 % — ABNORMAL LOW (ref 39.0–52.0)
Hemoglobin: 10.4 g/dL — ABNORMAL LOW (ref 13.0–17.0)
MCH: 33.4 pg (ref 26.0–34.0)
MCHC: 36.4 g/dL — ABNORMAL HIGH (ref 30.0–36.0)
MCV: 92 fL (ref 80.0–100.0)
Platelets: 133 10*3/uL — ABNORMAL LOW (ref 150–400)
RBC: 3.11 MIL/uL — ABNORMAL LOW (ref 4.22–5.81)
RDW: 13.4 % (ref 11.5–15.5)
WBC: 9.5 10*3/uL (ref 4.0–10.5)
nRBC: 0 % (ref 0.0–0.2)

## 2020-05-14 LAB — RENAL FUNCTION PANEL
Albumin: 2.9 g/dL — ABNORMAL LOW (ref 3.5–5.0)
Anion gap: 6 (ref 5–15)
BUN: 28 mg/dL — ABNORMAL HIGH (ref 8–23)
CO2: 23 mmol/L (ref 22–32)
Calcium: 7.8 mg/dL — ABNORMAL LOW (ref 8.9–10.3)
Chloride: 98 mmol/L (ref 98–111)
Creatinine, Ser: 1.49 mg/dL — ABNORMAL HIGH (ref 0.61–1.24)
GFR calc Af Amer: 48 mL/min — ABNORMAL LOW (ref >60)
GFR calc non Af Amer: 42 mL/min — ABNORMAL LOW (ref >60)
Glucose, Bld: 91 mg/dL (ref 70–99)
Phosphorus: 3 mg/dL (ref 2.5–4.6)
Potassium: 4.8 mmol/L (ref 3.5–5.1)
Sodium: 127 mmol/L — ABNORMAL LOW (ref 135–145)

## 2020-05-14 LAB — MAGNESIUM: Magnesium: 2 mg/dL (ref 1.7–2.4)

## 2020-05-14 MED ORDER — ENOXAPARIN SODIUM 40 MG/0.4ML ~~LOC~~ SOLN
40.0000 mg | SUBCUTANEOUS | Status: DC
  Administered 2020-05-14 – 2020-05-17 (×4): 40 mg via SUBCUTANEOUS
  Filled 2020-05-14 (×4): qty 0.4

## 2020-05-14 MED ORDER — SODIUM CHLORIDE 1 G PO TABS
1.0000 g | ORAL_TABLET | Freq: Two times a day (BID) | ORAL | Status: DC
  Administered 2020-05-14 – 2020-05-17 (×8): 1 g via ORAL
  Filled 2020-05-14 (×9): qty 1

## 2020-05-14 NOTE — Progress Notes (Signed)
Physical Therapy Treatment Patient Details Name: [name] MRN: [id number] DOB: Nov 28, 1930 Today's Date: 05/14/2020    History of Present Illness 86 y/o male who suffered a fall with R hip fx. Subsequent IM nailing 8/26.    PT Comments    Pt was seated in recliner with daughter present upon arriving for second session. He agrees to PT and is cooperative throughout. Was able to improve gait abilities and progress but still requires assistance with transfers and ambulation. Continued recommendation for SNF at DC. Discussed with daughter that DC is decided by MD once felt medically stable.    Follow Up Recommendations  SNF     Equipment Recommendations  None recommended by PT    Recommendations for Other Services       Precautions / Restrictions Precautions Precautions: Fall Restrictions Weight Bearing Restrictions: Yes RLE Weight Bearing: Weight bearing as tolerated    Mobility  Bed Mobility      General bed mobility comments: pt was in recliner pre/post session  Transfers Overall transfer level: Needs assistance Equipment used: Rolling walker (2 wheeled) Transfers: Sit to/from Stand Sit to Stand: Min assist         General transfer comment: Min assist to stand from recliner 3 x during session.   Ambulation/Gait Ambulation/Gait assistance: Min assist Gait Distance (Feet): 10 Feet Assistive device: Rolling walker (2 wheeled) Gait Pattern/deviations: Step-to pattern;Antalgic;Trunk flexed;Narrow base of support Gait velocity: decreased   General Gait Details: Less severe knee buckling this afternoon versus morning. still has slight R knee buckling at time. able to perform 2x ~ 10 ft with RW   Stairs             Wheelchair Mobility    Modified Rankin (Stroke Patients Only)       Balance Overall balance assessment: Needs assistance Sitting-balance support: Single extremity supported Sitting balance-Leahy Scale: Good Sitting balance -  Comments: no LOB in sitting   Standing balance support: Bilateral upper extremity supported Standing balance-Leahy Scale: Fair Standing balance comment: Heavy support on RW                             Cognition Arousal/Alertness: Awake/alert Behavior During Therapy: WFL for tasks assessed/performed Overall Cognitive Status: Within Functional Limits for tasks assessed                                General Comments: Pt is alert and motivated but requires extra time and does not like to be rushed      Exercises      General Comments        Pertinent Vitals/Pain Pain Assessment: 0-10 Pain Score: 3  Pain Intervention(s): Limited activity within patient's tolerance;Monitored during session;Premedicated before session;Repositioned;Ice applied    Home Living   Prior Function    PT Goals (current goals can now be found in the care plan section) Acute Rehab PT Goals Patient Stated Goal: go home after rehab Progress towards PT goals: Progressing toward goals    Frequency    BID      PT Plan Current plan remains appropriate       AM-PAC PT "6 Clicks" Mobility   Outcome Measure  Help needed turning from your back to your side while in a flat bed without using bedrails?: A Little Help needed moving from lying on your back to sitting on the side of a  flat bed without using bedrails?: A Lot Help needed moving to and from a bed to a chair (including a wheelchair)?: A Lot Help needed standing up from a chair using your arms (e.g., wheelchair or bedside chair)?: A Lot Help needed to walk in hospital room?: A Lot Help needed climbing 3-5 steps with a railing? : A Lot 6 Click Score: 13    End of Session Equipment Utilized During Treatment: Gait belt Activity Tolerance: Patient tolerated treatment well Patient left: in chair;with call bell/phone within reach;with chair alarm set Nurse Communication: Mobility status PT Visit Diagnosis: Muscle  weakness (generalized) (M62.81);Difficulty in walking, not elsewhere classified (R26.2);Pain;Unsteadiness on feet (R26.81) Pain - Right/Left: Right Pain - part of body: Hip     Time: 3329-5188 PT Time Calculation (min) (ACUTE ONLY): 25 min  Charges:  $Gait Training: 23-37 mins $Therapeutic Activity: 8-22 mins                     Julaine Fusi PTA 05/14/20, 4:45 PM

## 2020-05-14 NOTE — Progress Notes (Signed)
PHARMACIST - PHYSICIAN COMMUNICATION  CONCERNING:  Enoxaparin (Lovenox) for DVT Prophylaxis    RECOMMENDATION: Patient was prescribed enoxaparin 30mg  q24 hours for VTE prophylaxis.  Filed Weights   05/11/20 1655  Weight: 77.1 kg (170 lb)    Body mass index is 23.71 kg/m.  Estimated Creatinine Clearance: 37.2 mL/min (A) (by C-G formula based on SCr of 1.49 mg/dL (H)).   Patient is candidate for enoxaparin 40mg  every 24 hours based on CrCl >75ml/min  DESCRIPTION: Pharmacy has adjusted enoxaparin dose per [facility] policy.  Patient is now receiving enoxaparin 40 mg every 24 hours    Pernell Dupre, PharmD, BCPS Clinical Pharmacist 05/14/2020 7:48 AM

## 2020-05-14 NOTE — Progress Notes (Signed)
Subjective:  POD #2 s/p intramedullary fixation for right intertrochanteric hip fracture.   Patient reports right hip pain as moderate.  Patient getting up with physical therapy while in the room.  He is standing with his walker.  Objective:   VITALS:   Vitals:   05/13/20 2258 05/14/20 0438 05/14/20 0807 05/14/20 1203  BP: 140/73 (!) 146/72 (!) 160/83 126/75  Pulse: 82 78 80 75  Resp: 16 17 18 16   Temp: 98.5 F (36.9 C) (!) 97.5 F (36.4 C) 97.9 F (36.6 C) 98.5 F (36.9 C)  TempSrc: Oral Oral Oral Oral  SpO2: 93% 93% 95% 94%  Weight:      Height:        PHYSICAL EXAM: Right lower extremity Neurovascular intact Sensation intact distally Intact pulses distally Dorsiflexion/Plantar flexion intact Incision: scant drainage No cellulitis present Compartment soft  LABS  Results for orders placed or performed during the hospital encounter of 05/11/20 (from the past 24 hour(s))  Renal function panel     Status: Abnormal   Collection Time: 05/14/20  4:28 AM  Result Value Ref Range   Sodium 127 (L) 135 - 145 mmol/L   Potassium 4.8 3.5 - 5.1 mmol/L   Chloride 98 98 - 111 mmol/L   CO2 23 22 - 32 mmol/L   Glucose, Bld 91 70 - 99 mg/dL   BUN 28 (H) 8 - 23 mg/dL   Creatinine, Ser 1.49 (H) 0.61 - 1.24 mg/dL   Calcium 7.8 (L) 8.9 - 10.3 mg/dL   Phosphorus 3.0 2.5 - 4.6 mg/dL   Albumin 2.9 (L) 3.5 - 5.0 g/dL   GFR calc non Af Amer 42 (L) >60 mL/min   GFR calc Af Amer 48 (L) >60 mL/min   Anion gap 6 5 - 15  Magnesium     Status: None   Collection Time: 05/14/20  4:28 AM  Result Value Ref Range   Magnesium 2.0 1.7 - 2.4 mg/dL  CBC     Status: Abnormal   Collection Time: 05/14/20  4:28 AM  Result Value Ref Range   WBC 9.5 4.0 - 10.5 K/uL   RBC 3.11 (L) 4.22 - 5.81 MIL/uL   Hemoglobin 10.4 (L) 13.0 - 17.0 g/dL   HCT 28.6 (L) 39 - 52 %   MCV 92.0 80.0 - 100.0 fL   MCH 33.4 26.0 - 34.0 pg   MCHC 36.4 (H) 30.0 - 36.0 g/dL   RDW 13.4 11.5 - 15.5 %   Platelets 133 (L) 150 -  400 K/uL   nRBC 0.0 0.0 - 0.2 %    DG HIP OPERATIVE UNILAT W OR W/O PELVIS RIGHT  Result Date: 05/12/2020 CLINICAL DATA:  Intramedullary rod fixation of right femur. EXAM: OPERATIVE right HIP (WITH PELVIS IF PERFORMED) 3 VIEWS TECHNIQUE: Fluoroscopic spot image(s) were submitted for interpretation post-operatively. FLUOROSCOPY TIME:  7.0734 mGy. COMPARISON:  May 11, 2020. FINDINGS: Three intraoperative fluoroscopic images were obtained of the right femur. These images demonstrate surgical internal fixation of proximal intertrochanteric fracture and intramedullary rod fixation of the right femur. IMPRESSION: Fluoroscopic guidance provided during surgical internal fixation of proximal right femoral intertrochanteric fracture. Electronically Signed   By: Marijo Conception M.D.   On: 05/12/2020 16:32    Assessment/Plan: 2 Days Post-Op   Active Problems:   CKD (chronic kidney disease) stage 3, GFR 30-59 ml/min   Abdominal aortic aneurysm (AAA) without rupture (HCC)   Arthritis of knee, degenerative   Benign essential HTN   CAD in  native artery   S/P total knee arthroplasty   MGUS (monoclonal gammopathy of unknown significance)   Hip fracture (HCC)  Patient progressing postop.  Continue physical therapy with weightbearing as tolerated on the right lower extremity.  Patient will require skilled nursing upon discharge.  Continue to monitor elevated creatinine.  Continue Lovenox for DVT prophylaxis.   Thornton Park , MD 05/14/2020, 12:07 PM

## 2020-05-14 NOTE — TOC Initial Note (Signed)
Transition of Care [facility]) - Initial/Assessment Note    Patient Details  Name: [name] MRN: [id number] Date of Birth: November 28, 1930  [facility] CM/SW Contact:    Harriet Masson, RN Phone Number: 05/14/2020, 1:22 PM  Clinical Narrative:    Received notice to contact the pt/family concerning pt's refusal to go to a SNF upon discharge for ongoing rehab. Inquires on CIR ([facility]). TOC RN contact three different personnel and left a HIPPA approved voice requesting a call back however maybe administration department at this inpt facility.  RN spoke with the pt directly in detail concerning possible options for ongoing rehab with HHPT, out-patient rehab or the recommended SNF. Pt reluctantly to initial approved this due to his wife passing at a facility. Further discussion with the pt as he agreed to the recommended SNF for his rehab. Pt has agreed to send out the request for possible bed offers in [place] for any available facility. Once accepted will discuss with his daughter/son to proceed. Pt feels better about placement understanding that this is temporary for rehab not a place of residence long term.    Currently awaiting orders to proceed with placement.                Expected Discharge Plan: Skilled Nursing Facility Barriers to Discharge: No Barriers Identified   Patient Goals and CMS Choice     Choice offered to / list presented to : Patient  Expected Discharge Plan and Services Expected Discharge Plan: [place]     Post Acute Care Choice: [place]                                        Prior Living Arrangements/Services     Patient language and need for interpreter reviewed:: Yes Do you feel safe going back to the place where you live?: Yes            Criminal Activity/Legal Involvement Pertinent to Current Situation/Hospitalization: No - Comment as needed  Activities of  Daily Living Home Assistive Devices/Equipment: Eyeglasses, Hearing aid ADL Screening (condition at time of admission) Patient's cognitive ability adequate to safely complete daily activities?: Yes Is the patient deaf or have difficulty hearing?: Yes Does the patient have difficulty seeing, even when wearing glasses/contacts?: No Does the patient have difficulty concentrating, remembering, or making decisions?: No Patient able to express need for assistance with ADLs?: No Does the patient have difficulty dressing or bathing?: No Independently performs ADLs?: Yes (appropriate for developmental age) Does the patient have difficulty walking or climbing stairs?: Yes Weakness of Legs: Right Weakness of Arms/Hands: None  Permission Sought/Granted   Permission granted to share information with : Yes, Verbal Permission Granted        Permission granted to share info w Relationship: daughter  Permission granted to share info w Contact Information: Pam  Emotional Assessment Appearance:: Appears younger than stated age Attitude/Demeanor/Rapport: Engaged Affect (typically observed): Adaptable Orientation: : Oriented to Self, Oriented to Place, Oriented to  Time, Oriented to Situation   Psych Involvement: No (comment)  Admission diagnosis:  Hip fracture ([place]) [S72.009A] Patient Active Problem List   Diagnosis Date Noted  . Hip fracture ([place]) 05/11/2020  . MGUS (monoclonal gammopathy of unknown significance) 05/23/2016  . S/P total knee arthroplasty 04/02/2016  . Chronic kidney disease 11/22/2015  . CAD in native artery 11/22/2015  .  CKD (chronic kidney disease) stage 3, GFR 30-59 ml/min 06/30/2015  . TI (tricuspid incompetence) 05/11/2015  . Early satiety 01/25/2015  . Benign essential HTN 01/03/2015  . Abdominal aortic aneurysm (AAA) without rupture ([place]) 07/09/2014  . MI (mitral incompetence) 07/09/2014  . Aortic heart valve narrowing 07/09/2014  . Cardiomyopathy, ischemic 06/28/2014   . Arthritis of knee, degenerative 05/17/2014  . Kidney lump 04/09/2013  . Benign prostatic hyperplasia with urinary obstruction 03/17/2013  . Chronic kidney disease, stage IV (severe) ([place]) 03/17/2013  . 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

## 2020-05-14 NOTE — Progress Notes (Signed)
PROGRESS NOTE  [name] UQJ:[id number] DOB: 08-Jul-1931   PCP: Baxter Hire, MD  Patient is from: Home.  Independently ambulates at baseline.  DOA: 05/11/2020 LOS: 3  Brief Narrative / Interim history: 86 year old male with history of CAD/CABG, AV replacement, AAA, CKD, hyponatremia, MGUS, osteoarthritis and hypertension presenting with right hip pain after he slipped and fell onto the right hip at the grocery store.  No prodrome leading to this.   In ED, vital signs within normal.  NA 127 (baseline). Cr 1.68 (about baseline but no recent value).  BUN 33.  Hgb 12.  COVID-19 PCR negative.  CT head and neck without acute finding.  Right hip x-ray with displaced and angulated intertrochanteric right hip fracture.  Orthopedic surgery consulted, and patient was admitted.  Patient underwent right intramedullary nailing of intertrochanteric fracture on 8/26 by Dr. Harlow Mares.  Hospital course complicated by AKI and hyperkalemia likely from NSAID.   Now stable for discharge pending SNF.  However, he is undecided about SNF.  Subjective: Seen and examined earlier this morning.  No major events overnight of this morning.  No complaints.  Pain fairly controlled.  He denies chest pain, dyspnea, GI or UTI symptoms.  He is undecided about SNF.  Objective: Vitals:   05/13/20 2258 05/14/20 0438 05/14/20 0807 05/14/20 1203  BP: 140/73 (!) 146/72 (!) 160/83 126/75  Pulse: 82 78 80 75  Resp: 16 17 18 16   Temp: 98.5 F (36.9 C) (!) 97.5 F (36.4 C) 97.9 F (36.6 C) 98.5 F (36.9 C)  TempSrc: Oral Oral Oral Oral  SpO2: 93% 93% 95% 94%  Weight:      Height:        Intake/Output Summary (Last 24 hours) at 05/14/2020 1311 Last data filed at 05/14/2020 0929 Gross per 24 hour  Intake 421.63 ml  Output 4850 ml  Net -4428.37 ml   Filed Weights   05/11/20 1655  Weight: 77.1 kg    Examination:  GENERAL: No apparent distress.  Nontoxic. HEENT: MMM.  Vision and hearing grossly intact.    NECK: Supple.  No apparent JVD.  RESP:  No IWOB.  Fair aeration bilaterally. CVS:  RRR. Heart sounds normal.  ABD/GI/GU: BS+. Abd soft, NTND.  MSK/EXT:  Moves extremities. No apparent deformity. No edema.  SKIN: Dressing over right hip DCI. NEURO: Awake, alert and oriented appropriately.  No apparent focal neuro deficit. PSYCH: Calm. Normal affect.   Procedures:  8/26-right intramedullary nailing of intertrochanteric fracture by Dr. Harlow Mares  Microbiology summarized: COVID-19 PCR negative.  Assessment & Plan: Accidental fall at the grocery store Displaced intertrochanteric right hip fracture -IM nailing of intratrochanteric fracture as above -Continue Ultracet for pain control.  History of anaphylaxis with opiates -Discontinue Toradol in the setting of AKI -WBAT on RLE.  PT/OT.  Subcu Lovenox for VTE prophylaxis for 2 weeks  Chronic hyponatremia: Na 127 (about baseline)> 127.  Urine sodium and urine osmolality elevated suggestive for SIADH.  He is also on ARB which could contribute.  TSH within normal. -Discontinue IV fluids -P.o. sodium chloride 1 g twice daily -Hold home ARB for now -Continue monitoring  AKI on CKD-3B/azotemia: Baseline Cr 1.6> 1.6 (admit)> > 2.0> 1.49.  BUN 30.  AKI likely due to NSAID.  AKI resolved. -Stopped Toradol and Avapro -Discontinue IV fluid.  Hyperkalemia: Resolved after a dose of Lokelma.  History of CAD/remote CABG with AV replacement in 2011-no cardiopulmonary symptoms.  Not on blood thinners.  And had a stress test on  04/19/2020 that revealed fixed defect from previous infarct but no reversible ischemic event. -Continue home cardiac medications after surgery  History of ischemic cardiomyopathy: Echo on 04/18/2020 with EF of 40 to 45%, moderate TVR, moderate RVE and moderate LAE and normal bioprosthetic aortic valve function.  Not on diuretics.  Appears euvolemic.  Cardiopulmonary symptoms. -Monitor fluid status. -Discontinue IV fluid.  AAA?  I  could not find report of this on his abdominal imaging in his chart or care everywhere. -Optimize blood pressure control.  Essential hypertension: Normotensive. -Continue amlodipine 10 mg daily -Continue holding home Avapro  Hyperlipidemia -Continue pravastatin.  Mild thrombocytopenia: Platelet 133.  To early for HIT. -Continue monitoring  Body mass index is 23.71 kg/m.         DVT prophylaxis:  enoxaparin (LOVENOX) injection 40 mg Start: 05/14/20 1000 SCDs Start: 05/12/20 1959 SCDs Start: 05/11/20 2148  Code Status: Full code Family Communication: None at bedside. Status is: Inpatient  Remains inpatient appropriate because:Unsafe d/c plan.  Patient with AKI and hyperkalemia.  On IV fluid.   Dispo: The patient is from: Home              Anticipated d/c is to: To be determined.  SNF versus home.               Anticipated d/c date is: 1 day 2 days              Patient currently is medically stable to d/c.       Consultants:  Orthopedic surgery   Sch Meds:  Scheduled Meds: . amLODipine  10 mg Oral Daily  . Chlorhexidine Gluconate Cloth  6 each Topical Daily  . docusate sodium  100 mg Oral BID  . enoxaparin (LOVENOX) injection  40 mg Subcutaneous Q24H  . pravastatin  20 mg Oral QPM  . sodium bicarbonate  1,300 mg Oral BID  . sodium chloride  1 g Oral BID WC   Continuous Infusions:  PRN Meds:.metoCLOPramide **OR** metoCLOPramide (REGLAN) injection, ondansetron **OR** ondansetron (ZOFRAN) IV, polyethylene glycol, traMADol-acetaminophen, zolpidem  Antimicrobials: Anti-infectives (From admission, onward)   Start     Dose/Rate Route Frequency Ordered Stop   05/12/20 2200  ceFAZolin (ANCEF) IVPB 2g/100 mL premix        2 g 200 mL/hr over 30 Minutes Intravenous Every 6 hours 05/12/20 1958 05/13/20 0945       I have personally reviewed the following labs and images: CBC: Recent Labs  Lab 05/11/20 1702 05/12/20 0521 05/13/20 0516 05/14/20 0428  WBC 7.4  10.9* 8.6 9.5  NEUTROABS 4.2  --   --   --   HGB 12.1* 11.8* 10.4* 10.4*  HCT 34.7* 32.2* 30.2* 28.6*  MCV 93.5 89.7 95.3 92.0  PLT 179 179 145* 133*   BMP &GFR Recent Labs  Lab 05/11/20 1702 05/12/20 0521 05/13/20 0516 05/14/20 0428  NA 127* 127* 125*  126* 127*  K 4.5 4.5 6.1*  6.1* 4.8  CL 95* 98 94*  96* 98  CO2 22 21* 22  24 23   GLUCOSE 108* 123* 104*  113* 91  BUN 33* 27* 30*  30* 28*  CREATININE 1.68* 1.49* 1.97*  2.00* 1.49*  CALCIUM 8.7* 8.5* 7.9*  8.0* 7.8*  MG  --   --  2.0 2.0  PHOS  --   --  4.3 3.0   Estimated Creatinine Clearance: 37.2 mL/min (A) (by C-G formula based on SCr of 1.49 mg/dL (H)). Liver & Pancreas: Recent Labs  Lab 05/11/20 1702 05/13/20 0516 05/14/20 0428  AST 22  --   --   ALT 13  --   --   ALKPHOS 61  --   --   BILITOT 1.1  --   --   PROT 6.7  --   --   ALBUMIN 3.8 3.0* 2.9*   No results for input(s): LIPASE, AMYLASE in the last 168 hours. No results for input(s): AMMONIA in the last 168 hours. Diabetic: No results for input(s): HGBA1C in the last 72 hours. No results for input(s): GLUCAP in the last 168 hours. Cardiac Enzymes: No results for input(s): CKTOTAL, CKMB, CKMBINDEX, TROPONINI in the last 168 hours. No results for input(s): PROBNP in the last 8760 hours. Coagulation Profile: Recent Labs  Lab 05/12/20 0521  INR 1.1   Thyroid Function Tests: Recent Labs    05/12/20 0521  TSH 3.808   Lipid Profile: No results for input(s): CHOL, HDL, LDLCALC, TRIG, CHOLHDL, LDLDIRECT in the last 72 hours. Anemia Panel: No results for input(s): VITAMINB12, FOLATE, FERRITIN, TIBC, IRON, RETICCTPCT in the last 72 hours. Urine analysis:    Component Value Date/Time   COLORURINE STRAW (A) 03/21/2016 1507   APPEARANCEUR CLEAR (A) 03/21/2016 1507   APPEARANCEUR Clear 11/22/2015 0950   LABSPEC 1.005 03/21/2016 1507   PHURINE 7.0 03/21/2016 1507   GLUCOSEU NEGATIVE 03/21/2016 1507   HGBUR NEGATIVE 03/21/2016 1507    BILIRUBINUR NEGATIVE 03/21/2016 1507   BILIRUBINUR Negative 11/22/2015 0950   KETONESUR NEGATIVE 03/21/2016 1507   PROTEINUR NEGATIVE 03/21/2016 1507   UROBILINOGEN 1.0 02/16/2010 1336   NITRITE NEGATIVE 03/21/2016 1507   LEUKOCYTESUR NEGATIVE 03/21/2016 1507   LEUKOCYTESUR Negative 11/22/2015 0950   Sepsis Labs: Invalid input(s): PROCALCITONIN, [place]  Microbiology: Recent Results (from the past 240 hour(s))  SARS Coronavirus 2 by RT PCR (hospital order, performed in [facility] hospital lab) Nasopharyngeal Nasopharyngeal Swab     Status: None   Collection Time: 05/11/20  5:02 PM   Specimen: Nasopharyngeal Swab  Result Value Ref Range Status   SARS Coronavirus 2 NEGATIVE NEGATIVE Final    Comment: (NOTE) SARS-CoV-2 target nucleic acids are NOT DETECTED.  The SARS-CoV-2 RNA is generally detectable in upper and lower respiratory specimens during the acute phase of infection. The lowest concentration of SARS-CoV-2 viral copies this assay can detect is 250 copies / mL. A negative result does not preclude SARS-CoV-2 infection and should not be used as the sole basis for treatment or other patient management decisions.  A negative result may occur with improper specimen collection / handling, submission of specimen other than nasopharyngeal swab, presence of viral mutation(s) within the areas targeted by this assay, and inadequate number of viral copies (<250 copies / mL). A negative result must be combined with clinical observations, patient history, and epidemiological information.  Fact Sheet for Patients:   StrictlyIdeas.no  Fact Sheet for Healthcare Providers: BankingDealers.co.za  This test is not yet approved or  cleared by the [place] FDA and has been authorized for detection and/or diagnosis of SARS-CoV-2 by FDA under an Emergency Use Authorization (EUA).  This EUA will remain in effect (meaning this test can be  used) for the duration of the COVID-19 declaration under Section 564(b)(1) of the Act, 21 U.S.C. section 360bbb-3(b)(1), unless the authorization is terminated or revoked sooner.  Performed at [facility], [address], [place], [place] [address]   Surgical pcr screen     Status: None   Collection Time: 05/11/20 11:30 PM  Specimen: Nasal Mucosa; Nasal Swab  Result Value Ref Range Status   MRSA, PCR NEGATIVE NEGATIVE Final   Staphylococcus aureus NEGATIVE NEGATIVE Final    Comment: (NOTE) The Xpert SA Assay (FDA approved for NASAL specimens in patients 75 years of age and older), is one component of a comprehensive surveillance program. It is not intended to diagnose infection nor to guide or monitor treatment. Performed at [facility], [address], [place], [place] [address]     Radiology Studies: No results found.   Parminder Cupples T. Rusk  If 7PM-7AM, please contact night-coverage [email or web address] 05/14/2020, 1:11 PM

## 2020-05-14 NOTE — Progress Notes (Signed)
Physical Therapy Treatment Patient Details Name: [name] MRN: [id number] DOB: 1931-09-12 Today's Date: 05/14/2020    History of Present Illness 86 y/o male who suffered a fall with R hip fx. Subsequent IM nailing 8/26.    PT Comments    Pt was seated in recliner upon arriving. He agrees to PT session but is very frustrated about previous PT session. Discussed need for continued PT to progress towards personal/PT goals. He was agreeable to session and very motivated however requires a lot of increased time to perform all desired task. He was able to stand with min-mod assist + vcs to RW. Ambulated 3 x 8' with slow step to gait pattern + R knee buckling. Requires min assist to prevent buckling while advancing LLE. Fatigues quickly however vitals stable. Pt requested therapist call his daughter about DC disposition. Lengthy discussion. She stated she did not want pt to go to SNF and requested CIR consult. She is unwilling to personally provide required assistance at home. Therapist educated/discussed typical CIR dx and that he most likely would not qualify. She request pursuit anyway. CM notified post session. Acute PT recommends DC to SNF to address deficits and progress safe functional mobility. Pt was in recliner post session with call bell in reach and RN aware of his abilities. wioll return for 2nd PM session later this date.    Follow Up Recommendations  SNF     Equipment Recommendations  None recommended by PT    Recommendations for Other Services       Precautions / Restrictions Precautions Precautions: Fall Restrictions Weight Bearing Restrictions: Yes RLE Weight Bearing: Weight bearing as tolerated    Mobility  Bed Mobility     General bed mobility comments: pt was in recliner pre/post session  Transfers Overall transfer level: Needs assistance Equipment used: Rolling walker (2 wheeled) Transfers: Sit to/from Stand Sit to Stand: Min assist;Mod assist         General transfer comment: Extra time to perform + vcs for hand placement, fwd wt shift and overall technique improvements  Ambulation/Gait Ambulation/Gait assistance: Min assist;Mod assist Gait Distance (Feet): 8 Feet Assistive device: Rolling walker (2 wheeled) Gait Pattern/deviations: Step-to pattern;Antalgic;Trunk flexed;Narrow base of support Gait velocity: decreased   General Gait Details: pt has R knee buckling during gait. Therapist required  for min assist ever step to prevent buckling. 3 x ~ 8 ft (very slow)     Balance Overall balance assessment: Needs assistance Sitting-balance support: Single extremity supported Sitting balance-Leahy Scale: Good Sitting balance - Comments: no LOB in sitting   Standing balance support: Bilateral upper extremity supported Standing balance-Leahy Scale: Fair Standing balance comment: Heavy support on RW               Cognition Arousal/Alertness: Awake/alert Behavior During Therapy: WFL for tasks assessed/performed Overall Cognitive Status: Within Functional Limits for tasks assessed          General Comments: Pt is alert and motivated but requires extra time and does not like to be rushed             Pertinent Vitals/Pain Pain Assessment: 0-10 Pain Score: 3  Pain Intervention(s): Limited activity within patient's tolerance;Monitored during session;Repositioned;Ice applied           PT Goals (current goals can now be found in the care plan section) Acute Rehab PT Goals Patient Stated Goal: go home after rehab Progress towards PT goals: Progressing toward goals    Frequency    BID  PT Plan Current plan remains appropriate       AM-PAC PT "6 Clicks" Mobility   Outcome Measure  Help needed turning from your back to your side while in a flat bed without using bedrails?: A Little Help needed moving from lying on your back to sitting on the side of a flat bed without using bedrails?: A Lot Help needed  moving to and from a bed to a chair (including a wheelchair)?: A Lot Help needed standing up from a chair using your arms (e.g., wheelchair or bedside chair)?: A Lot Help needed to walk in hospital room?: A Lot Help needed climbing 3-5 steps with a railing? : A Lot 6 Click Score: 13    End of Session Equipment Utilized During Treatment: Gait belt Activity Tolerance: Patient tolerated treatment well Patient left: in chair;with call bell/phone within reach;with chair alarm set Nurse Communication: Mobility status PT Visit Diagnosis: Muscle weakness (generalized) (M62.81);Difficulty in walking, not elsewhere classified (R26.2);Pain;Unsteadiness on feet (R26.81) Pain - Right/Left: Right Pain - part of body: Hip     Time: 1112-1200 PT Time Calculation (min) (ACUTE ONLY): 48 min  Charges:  $Gait Training: 23-37 mins $Therapeutic Activity: 8-22 mins                     Julaine Fusi PTA 05/14/20, 4:37 PM

## 2020-05-15 DIAGNOSIS — K5903 Drug induced constipation: Secondary | ICD-10-CM

## 2020-05-15 LAB — RENAL FUNCTION PANEL
Albumin: 3 g/dL — ABNORMAL LOW (ref 3.5–5.0)
Anion gap: 7 (ref 5–15)
BUN: 34 mg/dL — ABNORMAL HIGH (ref 8–23)
CO2: 23 mmol/L (ref 22–32)
Calcium: 8.2 mg/dL — ABNORMAL LOW (ref 8.9–10.3)
Chloride: 95 mmol/L — ABNORMAL LOW (ref 98–111)
Creatinine, Ser: 1.45 mg/dL — ABNORMAL HIGH (ref 0.61–1.24)
GFR calc Af Amer: 50 mL/min — ABNORMAL LOW (ref >60)
GFR calc non Af Amer: 43 mL/min — ABNORMAL LOW (ref >60)
Glucose, Bld: 104 mg/dL — ABNORMAL HIGH (ref 70–99)
Phosphorus: 3.6 mg/dL (ref 2.5–4.6)
Potassium: 4.2 mmol/L (ref 3.5–5.1)
Sodium: 125 mmol/L — ABNORMAL LOW (ref 135–145)

## 2020-05-15 LAB — CBC
HCT: 28 % — ABNORMAL LOW (ref 39.0–52.0)
Hemoglobin: 10.1 g/dL — ABNORMAL LOW (ref 13.0–17.0)
MCH: 32.8 pg (ref 26.0–34.0)
MCHC: 36.1 g/dL — ABNORMAL HIGH (ref 30.0–36.0)
MCV: 90.9 fL (ref 80.0–100.0)
Platelets: 144 10*3/uL — ABNORMAL LOW (ref 150–400)
RBC: 3.08 MIL/uL — ABNORMAL LOW (ref 4.22–5.81)
RDW: 13.4 % (ref 11.5–15.5)
WBC: 8.8 10*3/uL (ref 4.0–10.5)
nRBC: 0 % (ref 0.0–0.2)

## 2020-05-15 LAB — MAGNESIUM: Magnesium: 2 mg/dL (ref 1.7–2.4)

## 2020-05-15 MED ORDER — POLYETHYLENE GLYCOL 3350 17 G PO PACK
17.0000 g | PACK | Freq: Every day | ORAL | Status: DC
  Administered 2020-05-15 – 2020-05-16 (×2): 17 g via ORAL
  Filled 2020-05-15 (×2): qty 1

## 2020-05-15 NOTE — TOC Progression Note (Addendum)
Transition of Care [facility]) - Progression Note    Patient Details  Name: JABREE REBERT MRN: [id number] Date of Birth: 1931-05-04  Transition of Care [facility]) CM/SW Contact  Zigmund Daniel Dorian Pod, RN Phone Number: 05/15/2020, 11:58 AM  Clinical Narrative:     HTA[facility] prior approval SNF facility [phone]- active for 5 days (expirer Friday 5:00 PM). PASSAR [id number] A. HTA requires prior authorization form to be completed and faxed. Currently awaiting on clearance on networking transport services from [place] [facility] [phone]). [facility] was pt's initial choice if insurance approves but any agency in network is "okay".  Pt up in chair today at bedside conversation with his daughter. No pain or issues mentions as pt continues to recovery well.  Once FL2 signed will fax out to facility. Pst adamantly refuse [facility] [facility] as an option.   TOC will continue to follow.  Addendum: HTA [facility]) provided ambulance transportion services for [facility] [phone] active for 3 months.    Expected Discharge Plan: [place] Barriers to Discharge: No Barriers Identified  Expected Discharge Plan and Services Expected Discharge Plan: [place]     Post Acute Care Choice: [place]                                         Social Determinants of Health (SDOH) Interventions    Readmission Risk Interventions No flowsheet data found.

## 2020-05-15 NOTE — Progress Notes (Signed)
Physical Therapy Treatment Patient Details Name: [name] MRN: [id number] DOB: 1931/08/25 Today's Date: 05/15/2020    History of Present Illness 86 y/o male who suffered a fall with R hip fx. Subsequent IM nailing 8/26.    PT Comments    In chair.  Initially asking for pain meds prior but once nurse notified he wanted to continue with session.  Stood with min a x 1 with general buckling noted during transition from chair to walker.  He is able to walk to door x 2 with 1 seated rest due to fatigue.  No buckling noted with gait today but mod vc's for walker placement on first walk and significant improvement on second with further education. Participated in exercises as described below.    Discussed at length progression of therapy, discharge and reasons for recommendations.  He is receptive to information today and voiced understanding regarding walker placement, safety reasons for no independent gait and standing in facility and discharge planning.  He does voice he is open to rehab but does not want [facility] as his wife passed there.  Seemed more content and understanding after session.     Follow Up Recommendations  SNF     Equipment Recommendations  None recommended by PT    Recommendations for Other Services       Precautions / Restrictions Precautions Precautions: Fall Restrictions Weight Bearing Restrictions: Yes RLE Weight Bearing: Weight bearing as tolerated    Mobility  Bed Mobility               General bed mobility comments: pt was in recliner pre/post session  Transfers Overall transfer level: Needs assistance Equipment used: Rolling walker (2 wheeled)   Sit to Stand: Min assist         General transfer comment: stands with min a x 1. small buckling during each standing attempt but mostly able to recover without assist.  Ambulation/Gait Ambulation/Gait assistance: Min assist Gait Distance (Feet): 20 Feet Assistive device: Rolling walker (2  wheeled) Gait Pattern/deviations: Step-to pattern;Antalgic;Trunk flexed;Narrow base of support Gait velocity: decreased   General Gait Details: to door and back x 2 with seated rest.  mod vc's for walker positioning.  no buckling noted today.   Stairs             Wheelchair Mobility    Modified Rankin (Stroke Patients Only)       Balance Overall balance assessment: Needs assistance   Sitting balance-Leahy Scale: Good     Standing balance support: Bilateral upper extremity supported Standing balance-Leahy Scale: Fair Standing balance comment: Heavy support on RW                             Cognition Arousal/Alertness: Awake/alert Behavior During Therapy: WFL for tasks assessed/performed Overall Cognitive Status: Within Functional Limits for tasks assessed                                 General Comments: Pt is alert and motivated but requires extra time      Exercises Other Exercises Other Exercises: seated LAQ, ankle pumps.  standing SLR and marches x 10    General Comments        Pertinent Vitals/Pain Pain Assessment: 0-10 Pain Score: 3  Pain Descriptors / Indicators: Sore Pain Intervention(s): Patient requesting pain meds-RN notified;Monitored during session    Home Living  Prior Function            PT Goals (current goals can now be found in the care plan section) Progress towards PT goals: Progressing toward goals    Frequency    BID      PT Plan Current plan remains appropriate    Co-evaluation              AM-PAC PT "6 Clicks" Mobility   Outcome Measure  Help needed turning from your back to your side while in a flat bed without using bedrails?: A Little Help needed moving from lying on your back to sitting on the side of a flat bed without using bedrails?: A Little Help needed moving to and from a bed to a chair (including a wheelchair)?: A Little Help needed standing  up from a chair using your arms (e.g., wheelchair or bedside chair)?: A Little Help needed to walk in hospital room?: A Little Help needed climbing 3-5 steps with a railing? : A Lot 6 Click Score: 17    End of Session Equipment Utilized During Treatment: Gait belt Activity Tolerance: Patient tolerated treatment well Patient left: in chair;with call bell/phone within reach (refusing chair alarm) Nurse Communication: Mobility status Pain - Right/Left: Right Pain - part of body: Hip     Time: 2217-9810 PT Time Calculation (min) (ACUTE ONLY): 26 min  Charges:  $Gait Training: 8-22 mins $Therapeutic Exercise: 8-22 mins                    Chesley Noon, PTA 05/15/20, 11:06 AM

## 2020-05-15 NOTE — NC FL2 (Signed)
[place] LEVEL OF CARE SCREENING TOOL     IDENTIFICATION  Patient Name: [name] Birthdate: [birth year] Sex: male Admission Date (Current Location): 05/11/2020  [place] and [place] Number:  Engineering geologist and Address:  [facility], [address], [place], [place] [address]      Provider Number: [id number]  Attending Physician Name and Address:  Annita Brod, MD  Relative Name and Phone Number:  Dell Ponto [phone]    Current Level of Care: Hospital Recommended Level of Care: [place] Prior Approval Number: [id number]  Date Approved/Denied: 05/15/20 PASRR Number: [id number]  Discharge Plan: SNF    Current Diagnoses: Patient Active Problem List   Diagnosis Date Noted  . Hip fracture ([place]) 05/11/2020  . MGUS (monoclonal gammopathy of unknown significance) 05/23/2016  . S/P total knee arthroplasty 04/02/2016  . Chronic kidney disease 11/22/2015  . CAD in native artery 11/22/2015  . CKD (chronic kidney disease) stage 3, GFR 30-59 ml/min 06/30/2015  . TI (tricuspid incompetence) 05/11/2015  . Early satiety 01/25/2015  . Benign essential HTN 01/03/2015  . Abdominal aortic aneurysm (AAA) without rupture ([place]) 07/09/2014  . MI (mitral incompetence) 07/09/2014  . Aortic heart valve narrowing 07/09/2014  . Cardiomyopathy, ischemic 06/28/2014  . Arthritis of knee, degenerative 05/17/2014  . Kidney lump 04/09/2013  . Benign prostatic hyperplasia with urinary obstruction 03/17/2013  . Chronic kidney disease, stage IV (severe) ([place]) 03/17/2013  . 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    Orientation RESPIRATION BLADDER Height & Weight     Self, Time, Situation, Place  Normal Continent Weight: 77.1 kg Height:  5\' 11"  (180.3 cm)  BEHAVIORAL SYMPTOMS/MOOD NEUROLOGICAL BOWEL NUTRITION STATUS      Continent Diet  AMBULATORY STATUS COMMUNICATION OF NEEDS Skin   Limited Assist Verbally                          Personal Care Assistance Level of Assistance  Bathing, Feeding, Dressing Bathing Assistance: Limited assistance Feeding assistance: Limited assistance Dressing Assistance: Limited assistance     Functional Limitations Info             SPECIAL CARE FACTORS FREQUENCY                       Contractures Contractures Info: Not present    Additional Factors Info      Allergies Info: Morphine, Oxycodone, Carvedilol, Iodinated Dx agent           Current Medications (05/15/2020):  This is the current hospital active medication list Current Facility-Administered Medications  Medication Dose Route Frequency Provider Last Rate Last Admin  . amLODipine (NORVASC) tablet 10 mg  10 mg Oral Daily Wendee Beavers T, MD   10 mg at 05/15/20 1029  . Chlorhexidine Gluconate Cloth 2 % PADS 6 each  6 each Topical Daily Lovell Sheehan, MD   6 each at 05/12/20 1039  . docusate sodium (COLACE) capsule 100 mg  100 mg Oral BID Lovell Sheehan, MD   100 mg at 05/15/20 1029  . enoxaparin (LOVENOX) injection 40 mg  40 mg Subcutaneous Q24H Hallaji, Sheema M, RPH   40 mg at 05/15/20 1029  . metoCLOPramide (REGLAN) tablet 5-10 mg  5-10 mg Oral Q8H PRN Lovell Sheehan, MD       Or  . metoCLOPramide (REGLAN) injection 5-10 mg  5-10 mg Intravenous Q8H PRN Kurtis Bushman  R, MD      . ondansetron (ZOFRAN) tablet 4 mg  4 mg Oral Q6H PRN Lovell Sheehan, MD       Or  . ondansetron [facility]) injection 4 mg  4 mg Intravenous Q6H PRN Lovell Sheehan, MD      . polyethylene glycol (MIRALAX / GLYCOLAX) packet 17 g  17 g Oral Daily PRN Lovell Sheehan, MD      . pravastatin (PRAVACHOL) tablet 20 mg  20 mg Oral QPM Lovell Sheehan, MD   20 mg at 05/14/20 1816  . sodium bicarbonate tablet 1,300 mg  1,300 mg Oral BID Lovell Sheehan, MD   1,300 mg at 05/15/20 1029  . sodium chloride tablet 1 g  1 g Oral BID WC Gonfa, Taye T, MD   1 g at 05/15/20 1029  . traMADol-acetaminophen (ULTRACET)  37.5-325 MG per tablet 1-2 tablet  1-2 tablet Oral Q4H PRN Lovell Sheehan, MD   1 tablet at 05/15/20 1029     Discharge Medications: Please see discharge summary for a list of discharge medications.  Relevant Imaging Results:  Relevant Lab Results:   Additional Information    Zigmund Daniel, Dorian Pod, RN

## 2020-05-15 NOTE — Progress Notes (Addendum)
PROGRESS NOTE  LESLY JOSLYN XLK:[id number] DOB: 1931/09/15 DOA: 05/11/2020 PCP: Baxter Hire, MD  HPI/Recap of past 52 hours: 86 year old male with past medical history of CAD, AAA, chronic kidney disease and hypertension admitted on 8/25 after sustaining a mechanical fall and found to have a intertrochanteric right-sided hip fracture.  Status post IM nail done 8/26 by orthopedic surgery.  Hospital course complicated by hyperkalemia and acute kidney injury, felt to be from NSAIDs.  Since then has stabilized and awaiting skilled nursing facility bed.  Patient today doing okay.  Has not moved his bowels since he was admitted, 4 to 5 days ago.  Says he normally moves his bowels every 1 to 2 days.  Also states that he does not feel like he is urinating very much and is worried about his kidney function, although his kidney function continues to improve on a daily basis.  Assessment/Plan: Active Problems: Acute kidney injury in the setting of CKD (chronic kidney disease) stage 3, GFR 30-59 ml/min: On admission, creatinine at 1.6 which is the patient's baseline.  Increased to 2.0, felt to be secondary to NSAIDs.  Toradol and Avapro stopped and since then, has come down nicely, today at 1.45, ideal for patient.    Abdominal aortic aneurysm (AAA) without rupture [facility]): Stable  Chronic systolic heart failure: Echocardiogram done 8/2 noted ejection fraction of 40-45% with moderate tricuspid regurg.  Acute blood loss anemia: Stable.  Secondary to surgery.  Recheck in the morning.      Arthritis of knee, degenerative   Benign essential HTN: BP stable.   CAD in native artery   S/P total knee arthroplasty   MGUS (monoclonal gammopathy of unknown significance)    Hip fracture (HCC), R sided: S/p IM nail:     Constipation: Secondary to pain meds.  Have ordered miralax.   Code Status: full code   Family Communication: left message for family    Disposition Plan: Anticipate discharge to  skilled nursing tomorrow if approved   Consultants:  Orthopedic surgery  Procedures:  Status post IM nail done 8/26  Antimicrobials:  Preop Ancef  DVT prophylaxis: Started on Lovenox 8/28   Objective: Vitals:   05/14/20 2302 05/15/20 0759  BP: (!) 152/68 140/67  Pulse: 63 67  Resp: 18 17  Temp: 98 F (36.7 C) (!) 97.5 F (36.4 C)  SpO2: 96% 98%    Intake/Output Summary (Last 24 hours) at 05/15/2020 1416 Last data filed at 05/14/2020 2300 Gross per 24 hour  Intake --  Output 1575 ml  Net -1575 ml   Filed Weights   05/11/20 1655  Weight: 77.1 kg   Body mass index is 23.71 kg/m.  Exam:   General: Alert and oriented x3, no acute distress  Cardiovascular: Regular rate and rhythm, S1-S2  Respiratory: Clear to auscultation bilaterally  Abdomen: Soft, nontender, nondistended, hypoactive bowel sounds  Musculoskeletal: No clubbing or cyanosis, trace pitting edema  Neuro: No focal deficits  Psychiatry: Appropriate, no evidence of psychosis   Data Reviewed: CBC: Recent Labs  Lab 05/11/20 1702 05/12/20 0521 05/13/20 0516 05/14/20 0428 05/15/20 0334  WBC 7.4 10.9* 8.6 9.5 8.8  NEUTROABS 4.2  --   --   --   --   HGB 12.1* 11.8* 10.4* 10.4* 10.1*  HCT 34.7* 32.2* 30.2* 28.6* 28.0*  MCV 93.5 89.7 95.3 92.0 90.9  PLT 179 179 145* 133* 366*   Basic Metabolic Panel: Recent Labs  Lab 05/11/20 1702 05/12/20 0521 05/13/20 0516 05/14/20 4403  05/15/20 0334  NA 127* 127* 125*  126* 127* 125*  K 4.5 4.5 6.1*  6.1* 4.8 4.2  CL 95* 98 94*  96* 98 95*  CO2 22 21* 22  24 23 23   GLUCOSE 108* 123* 104*  113* 91 104*  BUN 33* 27* 30*  30* 28* 34*  CREATININE 1.68* 1.49* 1.97*  2.00* 1.49* 1.45*  CALCIUM 8.7* 8.5* 7.9*  8.0* 7.8* 8.2*  MG  --   --  2.0 2.0 2.0  PHOS  --   --  4.3 3.0 3.6   GFR: Estimated Creatinine Clearance: 38.2 mL/min (A) (by C-G formula based on SCr of 1.45 mg/dL (H)). Liver Function Tests: Recent Labs  Lab 05/11/20 1702  05/13/20 0516 05/14/20 0428 05/15/20 0334  AST 22  --   --   --   ALT 13  --   --   --   ALKPHOS 61  --   --   --   BILITOT 1.1  --   --   --   PROT 6.7  --   --   --   ALBUMIN 3.8 3.0* 2.9* 3.0*   No results for input(s): LIPASE, AMYLASE in the last 168 hours. No results for input(s): AMMONIA in the last 168 hours. Coagulation Profile: Recent Labs  Lab 05/12/20 0521  INR 1.1   Cardiac Enzymes: No results for input(s): CKTOTAL, CKMB, CKMBINDEX, TROPONINI in the last 168 hours. BNP (last 3 results) No results for input(s): PROBNP in the last 8760 hours. HbA1C: No results for input(s): HGBA1C in the last 72 hours. CBG: No results for input(s): GLUCAP in the last 168 hours. Lipid Profile: No results for input(s): CHOL, HDL, LDLCALC, TRIG, CHOLHDL, LDLDIRECT in the last 72 hours. Thyroid Function Tests: No results for input(s): TSH, T4TOTAL, FREET4, T3FREE, THYROIDAB in the last 72 hours. Anemia Panel: No results for input(s): VITAMINB12, FOLATE, FERRITIN, TIBC, IRON, RETICCTPCT in the last 72 hours. Urine analysis:    Component Value Date/Time   COLORURINE STRAW (A) 03/21/2016 1507   APPEARANCEUR CLEAR (A) 03/21/2016 1507   APPEARANCEUR Clear 11/22/2015 0950   LABSPEC 1.005 03/21/2016 1507   PHURINE 7.0 03/21/2016 1507   GLUCOSEU NEGATIVE 03/21/2016 1507   HGBUR NEGATIVE 03/21/2016 1507   BILIRUBINUR NEGATIVE 03/21/2016 1507   BILIRUBINUR Negative 11/22/2015 0950   KETONESUR NEGATIVE 03/21/2016 1507   PROTEINUR NEGATIVE 03/21/2016 1507   UROBILINOGEN 1.0 02/16/2010 1336   NITRITE NEGATIVE 03/21/2016 1507   LEUKOCYTESUR NEGATIVE 03/21/2016 1507   LEUKOCYTESUR Negative 11/22/2015 0950   Sepsis Labs: @LABRCNTIP (procalcitonin:4,lacticidven:4)  ) Recent Results (from the past 240 hour(s))  SARS Coronavirus 2 by RT PCR (hospital order, performed in [place] hospital lab) Nasopharyngeal Nasopharyngeal Swab     Status: None   Collection Time: 05/11/20  5:02 PM    Specimen: Nasopharyngeal Swab  Result Value Ref Range Status   SARS Coronavirus 2 NEGATIVE NEGATIVE Final    Comment: (NOTE) SARS-CoV-2 target nucleic acids are NOT DETECTED.  The SARS-CoV-2 RNA is generally detectable in upper and lower respiratory specimens during the acute phase of infection. The lowest concentration of SARS-CoV-2 viral copies this assay can detect is 250 copies / mL. A negative result does not preclude SARS-CoV-2 infection and should not be used as the sole basis for treatment or other patient management decisions.  A negative result may occur with improper specimen collection / handling, submission of specimen other than nasopharyngeal swab, presence of viral mutation(s) within the areas targeted by  this assay, and inadequate number of viral copies (<250 copies / mL). A negative result must be combined with clinical observations, patient history, and epidemiological information.  Fact Sheet for Patients:   StrictlyIdeas.no  Fact Sheet for Healthcare Providers: BankingDealers.co.za  This test is not yet approved or  cleared by the [place] FDA and has been authorized for detection and/or diagnosis of SARS-CoV-2 by FDA under an Emergency Use Authorization (EUA).  This EUA will remain in effect (meaning this test can be used) for the duration of the COVID-19 declaration under Section 564(b)(1) of the Act, 21 U.S.C. section 360bbb-3(b)(1), unless the authorization is terminated or revoked sooner.  Performed at [facility], [address], [place], [place] [address]   Surgical pcr screen     Status: None   Collection Time: 05/11/20 11:30 PM   Specimen: Nasal Mucosa; Nasal Swab  Result Value Ref Range Status   MRSA, PCR NEGATIVE NEGATIVE Final   Staphylococcus aureus NEGATIVE NEGATIVE Final    Comment: (NOTE) The Xpert SA Assay (FDA approved for NASAL specimens in patients 14 years of age and  older), is one component of a comprehensive surveillance program. It is not intended to diagnose infection nor to guide or monitor treatment. Performed at [facility], [address] [place], [place] [address]       Studies: No results found.  Scheduled Meds: . amLODipine  10 mg Oral Daily  . Chlorhexidine Gluconate Cloth  6 each Topical Daily  . docusate sodium  100 mg Oral BID  . enoxaparin (LOVENOX) injection  40 mg Subcutaneous Q24H  . polyethylene glycol  17 g Oral Daily  . pravastatin  20 mg Oral QPM  . sodium bicarbonate  1,300 mg Oral BID  . sodium chloride  1 g Oral BID WC    Continuous Infusions:   LOS: 4 days     Annita Brod, MD [facility]   05/15/2020, 2:16 PM

## 2020-05-15 NOTE — Plan of Care (Signed)

## 2020-05-15 NOTE — Progress Notes (Signed)
  Subjective:  POD #3 s/p intramedullary fixation for right intertrochanteric hip fracture.   Patient reports right hip pain as mild to moderate.  Patient sitting up out of bed to a chair.    Objective:   VITALS:   Vitals:   05/14/20 1203 05/14/20 1612 05/14/20 2302 05/15/20 0759  BP: 126/75 124/71 (!) 152/68 140/67  Pulse: 75 82 63 67  Resp: 16 16 18 17   Temp: 98.5 F (36.9 C) (!) 97.4 F (36.3 C) 98 F (36.7 C) (!) 97.5 F (36.4 C)  TempSrc: Oral Oral Oral Oral  SpO2: 94% 98% 96% 98%  Weight:      Height:        PHYSICAL EXAM: Right lower extremity Neurovascular intact Sensation intact distally Intact pulses distally Dorsiflexion/Plantar flexion intact Incision: scant drainage No cellulitis present Compartment soft  LABS  Results for orders placed or performed during the hospital encounter of 05/11/20 (from the past 24 hour(s))  Renal function panel     Status: Abnormal   Collection Time: 05/15/20  3:34 AM  Result Value Ref Range   Sodium 125 (L) 135 - 145 mmol/L   Potassium 4.2 3.5 - 5.1 mmol/L   Chloride 95 (L) 98 - 111 mmol/L   CO2 23 22 - 32 mmol/L   Glucose, Bld 104 (H) 70 - 99 mg/dL   BUN 34 (H) 8 - 23 mg/dL   Creatinine, Ser 1.45 (H) 0.61 - 1.24 mg/dL   Calcium 8.2 (L) 8.9 - 10.3 mg/dL   Phosphorus 3.6 2.5 - 4.6 mg/dL   Albumin 3.0 (L) 3.5 - 5.0 g/dL   GFR calc non Af Amer 43 (L) >60 mL/min   GFR calc Af Amer 50 (L) >60 mL/min   Anion gap 7 5 - 15  Magnesium     Status: None   Collection Time: 05/15/20  3:34 AM  Result Value Ref Range   Magnesium 2.0 1.7 - 2.4 mg/dL  CBC     Status: Abnormal   Collection Time: 05/15/20  3:34 AM  Result Value Ref Range   WBC 8.8 4.0 - 10.5 K/uL   RBC 3.08 (L) 4.22 - 5.81 MIL/uL   Hemoglobin 10.1 (L) 13.0 - 17.0 g/dL   HCT 28.0 (L) 39 - 52 %   MCV 90.9 80.0 - 100.0 fL   MCH 32.8 26.0 - 34.0 pg   MCHC 36.1 (H) 30.0 - 36.0 g/dL   RDW 13.4 11.5 - 15.5 %   Platelets 144 (L) 150 - 400 K/uL   nRBC 0.0 0.0 - 0.2 %     No results found.  Assessment/Plan: 3 Days Post-Op   Active Problems:   CKD (chronic kidney disease) stage 3, GFR 30-59 ml/min   Abdominal aortic aneurysm (AAA) without rupture (HCC)   Arthritis of knee, degenerative   Benign essential HTN   CAD in native artery   S/P total knee arthroplasty   MGUS (monoclonal gammopathy of unknown significance)   Hip fracture ([place])  Patient progressing with physical therapy.  Continue weightbearing as tolerated on the right lower extremity.  Continue Lovenox 40 mg daily for DVT prophylaxis x2 weeks.  Follow-up in the office with Dr. Harlow Mares at [facility] [phone]) in 10 to 14 days.  Patient awaiting SNF placement.   Thornton Park , MD 05/15/2020, 11:20 AM

## 2020-05-15 NOTE — NC FL2 (Signed)
[place] LEVEL OF CARE SCREENING TOOL     IDENTIFICATION  Patient Name: [name] Birthdate: 01-27-1931 Sex: male Admission Date (Current Location): 05/11/2020  [place] and [place] Number:  Engineering geologist and Address:  [facility], [address], [place], [place] [address]      Provider Number: [id number]  Attending Physician Name and Address:  Annita Brod, MD  Relative Name and Phone Number:  Dell Ponto [phone]    Current Level of Care: Hospital Recommended Level of Care: [place] Prior Approval Number: [id number]  Date Approved/Denied: 05/15/20 PASRR Number: [id number]  Discharge Plan: SNF    Current Diagnoses: Patient Active Problem List   Diagnosis Date Noted  . Hip fracture ([place]) 05/11/2020  . MGUS (monoclonal gammopathy of unknown significance) 05/23/2016  . S/P total knee arthroplasty 04/02/2016  . Chronic kidney disease 11/22/2015  . CAD in native artery 11/22/2015  . CKD (chronic kidney disease) stage 3, GFR 30-59 ml/min 06/30/2015  . TI (tricuspid incompetence) 05/11/2015  . Early satiety 01/25/2015  . Benign essential HTN 01/03/2015  . Abdominal aortic aneurysm (AAA) without rupture ([place]) 07/09/2014  . MI (mitral incompetence) 07/09/2014  . Aortic heart valve narrowing 07/09/2014  . Cardiomyopathy, ischemic 06/28/2014  . Arthritis of knee, degenerative 05/17/2014  . Kidney lump 04/09/2013  . Benign prostatic hyperplasia with urinary obstruction 03/17/2013  . Chronic kidney disease, stage IV (severe) ([place]) 03/17/2013  . 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    Orientation RESPIRATION BLADDER Height & Weight     Self, Time, Situation, Place  Normal Continent Weight: 77.1 kg Height:  5\' 11"  (180.3 cm)  BEHAVIORAL SYMPTOMS/MOOD NEUROLOGICAL BOWEL NUTRITION STATUS      Continent Diet  AMBULATORY STATUS COMMUNICATION OF NEEDS Skin   Limited Assist Verbally                          Personal Care Assistance Level of Assistance  Bathing, Feeding, Dressing Bathing Assistance: Limited assistance Feeding assistance: Limited assistance Dressing Assistance: Limited assistance     Functional Limitations Info             SPECIAL CARE FACTORS FREQUENCY                       Contractures Contractures Info: Not present    Additional Factors Info      Allergies Info: Morphine, Oxycodone, Carvedilol, Iodinated Dx agent           Current Medications (05/15/2020):  This is the current hospital active medication list Current Facility-Administered Medications  Medication Dose Route Frequency Provider Last Rate Last Admin  . amLODipine (NORVASC) tablet 10 mg  10 mg Oral Daily Wendee Beavers T, MD   10 mg at 05/15/20 1029  . Chlorhexidine Gluconate Cloth 2 % PADS 6 each  6 each Topical Daily Lovell Sheehan, MD   6 each at 05/12/20 1039  . docusate sodium (COLACE) capsule 100 mg  100 mg Oral BID Lovell Sheehan, MD   100 mg at 05/15/20 1029  . enoxaparin (LOVENOX) injection 40 mg  40 mg Subcutaneous Q24H Hallaji, Sheema M, RPH   40 mg at 05/15/20 1029  . metoCLOPramide (REGLAN) tablet 5-10 mg  5-10 mg Oral Q8H PRN Lovell Sheehan, MD       Or  . metoCLOPramide (REGLAN) injection 5-10 mg  5-10 mg Intravenous Q8H PRN Kurtis Bushman  R, MD      . ondansetron (ZOFRAN) tablet 4 mg  4 mg Oral Q6H PRN Lovell Sheehan, MD       Or  . ondansetron [facility]) injection 4 mg  4 mg Intravenous Q6H PRN Lovell Sheehan, MD      . polyethylene glycol (MIRALAX / GLYCOLAX) packet 17 g  17 g Oral Daily PRN Lovell Sheehan, MD      . pravastatin (PRAVACHOL) tablet 20 mg  20 mg Oral QPM Lovell Sheehan, MD   20 mg at 05/14/20 1816  . sodium bicarbonate tablet 1,300 mg  1,300 mg Oral BID Lovell Sheehan, MD   1,300 mg at 05/15/20 1029  . sodium chloride tablet 1 g  1 g Oral BID WC Gonfa, Taye T, MD   1 g at 05/15/20 1029  . traMADol-acetaminophen (ULTRACET)  37.5-325 MG per tablet 1-2 tablet  1-2 tablet Oral Q4H PRN Lovell Sheehan, MD   1 tablet at 05/15/20 1029  . zolpidem (AMBIEN) tablet 5 mg  5 mg Oral QHS PRN Lovell Sheehan, MD   5 mg at 05/14/20 2148     Discharge Medications: Please see discharge summary for a list of discharge medications.  Relevant Imaging Results:  Relevant Lab Results:   Additional Information    Zigmund Daniel, Dorian Pod, RN

## 2020-05-16 ENCOUNTER — Inpatient Hospital Stay: Payer: PPO

## 2020-05-16 DIAGNOSIS — S72001G Fracture of unspecified part of neck of right femur, subsequent encounter for closed fracture with delayed healing: Secondary | ICD-10-CM

## 2020-05-16 DIAGNOSIS — M1731 Unilateral post-traumatic osteoarthritis, right knee: Secondary | ICD-10-CM

## 2020-05-16 LAB — CBC
HCT: 29.3 % — ABNORMAL LOW (ref 39.0–52.0)
Hemoglobin: 10.2 g/dL — ABNORMAL LOW (ref 13.0–17.0)
MCH: 32.8 pg (ref 26.0–34.0)
MCHC: 34.8 g/dL (ref 30.0–36.0)
MCV: 94.2 fL (ref 80.0–100.0)
Platelets: 164 10*3/uL (ref 150–400)
RBC: 3.11 MIL/uL — ABNORMAL LOW (ref 4.22–5.81)
RDW: 13.2 % (ref 11.5–15.5)
WBC: 8.6 10*3/uL (ref 4.0–10.5)
nRBC: 0 % (ref 0.0–0.2)

## 2020-05-16 LAB — BASIC METABOLIC PANEL
Anion gap: 9 (ref 5–15)
BUN: 30 mg/dL — ABNORMAL HIGH (ref 8–23)
CO2: 22 mmol/L (ref 22–32)
Calcium: 8.5 mg/dL — ABNORMAL LOW (ref 8.9–10.3)
Chloride: 96 mmol/L — ABNORMAL LOW (ref 98–111)
Creatinine, Ser: 1.38 mg/dL — ABNORMAL HIGH (ref 0.61–1.24)
GFR calc Af Amer: 53 mL/min — ABNORMAL LOW (ref >60)
GFR calc non Af Amer: 46 mL/min — ABNORMAL LOW (ref >60)
Glucose, Bld: 104 mg/dL — ABNORMAL HIGH (ref 70–99)
Potassium: 4.1 mmol/L (ref 3.5–5.1)
Sodium: 127 mmol/L — ABNORMAL LOW (ref 135–145)

## 2020-05-16 LAB — GLUCOSE, CAPILLARY: Glucose-Capillary: 106 mg/dL — ABNORMAL HIGH (ref 70–99)

## 2020-05-16 MED ORDER — ENOXAPARIN SODIUM 40 MG/0.4ML ~~LOC~~ SOLN: 40.0000 mg | SUBCUTANEOUS | Status: DC

## 2020-05-16 MED ORDER — LACTULOSE 10 GM/15ML PO SOLN
20.0000 g | Freq: Three times a day (TID) | ORAL | Status: AC
  Administered 2020-05-16: 20 g via ORAL
  Filled 2020-05-16: qty 30

## 2020-05-16 MED ORDER — DOCUSATE SODIUM 100 MG PO CAPS: 100.0000 mg | ORAL_CAPSULE | Freq: Two times a day (BID) | ORAL | 0 refills | Status: DC

## 2020-05-16 MED ORDER — SODIUM CHLORIDE 0.9 % IV BOLUS
500.0000 mL | Freq: Once | INTRAVENOUS | Status: AC
  Administered 2020-05-16: 500 mL via INTRAVENOUS

## 2020-05-16 MED ORDER — TRAMADOL-ACETAMINOPHEN 37.5-325 MG PO TABS: 1.0000 | ORAL_TABLET | Freq: Four times a day (QID) | ORAL | 0 refills | Status: DC | PRN

## 2020-05-16 MED ORDER — POLYETHYLENE GLYCOL 3350 17 G PO PACK: 17.0000 g | PACK | Freq: Every day | ORAL | 0 refills | Status: DC | PRN

## 2020-05-16 MED ORDER — MAGNESIUM CITRATE PO SOLN
1.0000 | Freq: Once | ORAL | Status: AC
  Administered 2020-05-16: 1 via ORAL
  Filled 2020-05-16 (×2): qty 296

## 2020-05-16 MED ORDER — SODIUM CHLORIDE 0.9 % IV SOLN: INTRAVENOUS | Status: AC

## 2020-05-16 NOTE — Plan of Care (Signed)
  Problem: Education: Goal: Knowledge of General Education information will improve Description: Including pain rating scale, medication(s)/side effects and non-pharmacologic comfort measures 05/16/2020 0756 by Ferrel Logan, RN Outcome: Progressing 05/15/2020 1950 by Ferrel Logan, RN Outcome: Progressing   Problem: Health Behavior/Discharge Planning: Goal: Ability to manage health-related needs will improve 05/16/2020 0756 by Ferrel Logan, RN Outcome: Progressing 05/15/2020 1950 by Ferrel Logan, RN Outcome: Progressing   Problem: Clinical Measurements: Goal: Ability to maintain clinical measurements within normal limits will improve 05/16/2020 0756 by Ferrel Logan, RN Outcome: Progressing 05/15/2020 1950 by Ferrel Logan, RN Outcome: Progressing Goal: Will remain free from infection 05/16/2020 0756 by Ferrel Logan, RN Outcome: Progressing 05/15/2020 1950 by Ferrel Logan, RN Outcome: Progressing Goal: Diagnostic test results will improve 05/16/2020 0756 by Ferrel Logan, RN Outcome: Progressing 05/15/2020 1950 by Ferrel Logan, RN Outcome: Progressing Goal: Respiratory complications will improve 05/16/2020 0756 by Ferrel Logan, RN Outcome: Progressing 05/15/2020 1950 by Ferrel Logan, RN Outcome: Progressing Goal: Cardiovascular complication will be avoided 05/16/2020 0756 by Ferrel Logan, RN Outcome: Progressing 05/15/2020 1950 by Ferrel Logan, RN Outcome: Progressing   Problem: Activity: Goal: Risk for activity intolerance will decrease 05/16/2020 0756 by Ferrel Logan, RN Outcome: Progressing 05/15/2020 1950 by Ferrel Logan, RN Outcome: Progressing   Problem: Nutrition: Goal: Adequate nutrition will be maintained 05/16/2020 0756 by Ferrel Logan, RN Outcome: Progressing 05/15/2020 1950 by Ferrel Logan, RN Outcome: Progressing   Problem: Coping: Goal: Level of anxiety will decrease 05/16/2020 0756 by Ferrel Logan, RN Outcome:  Progressing 05/15/2020 1950 by Ferrel Logan, RN Outcome: Progressing   Problem: Elimination: Goal: Will not experience complications related to bowel motility 05/16/2020 0756 by Ferrel Logan, RN Outcome: Progressing 05/15/2020 1950 by Ferrel Logan, RN Outcome: Progressing Goal: Will not experience complications related to urinary retention 05/16/2020 0756 by Ferrel Logan, RN Outcome: Progressing 05/15/2020 1950 by Ferrel Logan, RN Outcome: Progressing   Problem: Pain Managment: Goal: General experience of comfort will improve 05/16/2020 0756 by Ferrel Logan, RN Outcome: Progressing 05/15/2020 1950 by Ferrel Logan, RN Outcome: Progressing   Problem: Safety: Goal: Ability to remain free from injury will improve 05/16/2020 0756 by Ferrel Logan, RN Outcome: Progressing 05/15/2020 1950 by Ferrel Logan, RN Outcome: Progressing   Problem: Skin Integrity: Goal: Risk for impaired skin integrity will decrease 05/16/2020 0756 by Ferrel Logan, RN Outcome: Progressing 05/15/2020 1950 by Ferrel Logan, RN Outcome: Progressing

## 2020-05-16 NOTE — Discharge Summary (Addendum)
Physician Discharge Summary  [name]:[id number] DOB: 06/19/1931 DOA: 05/11/2020  PCP: Baxter Hire, MD  Admit date: 05/11/2020 Discharge date: 05/17/2020  Admitted From: Home Disposition: SNF  Recommendations for Outpatient Follow-up:  1. Follow up with PCP in 1-2 weeks 2. Please obtain BMP/CBC in one week your next doctors visit.  3. Follow-up patient orthopedic in 2 weeks with Dr. Harlow Mares.  Weightbearing as tolerated on right lower extremity 4. Recommending daily Lovenox for DVT prophylaxis for at least 2 weeks until follow-up. 5. Recommend bowel regimen to have at least 1-2 soft bowel movements daily  Discharge Condition: Stable CODE STATUS: Full code Diet recommendation: Heart healthy  Brief/Interim Summary: 86 year old male with past medical history of CAD, AAA, chronic kidney disease and hypertension admitted on 8/25 after sustaining a mechanical fall and found to have a intertrochanteric right-sided hip fracture.  Status post IM nail done 8/26 by orthopedic surgery.  Hospital course complicated by hyperkalemia and acute kidney injury, felt to be from NSAIDs.  PT recommended SNF.  Renal function improved with IV fluids, on the day of discharge, creatinine was 1.38. He also suffered from constipation during the hospitalization requiring aggressive bowel regimen which will be continued upon discharge. Had an episode of dizziness on 8/30 which he claims was secondary to intense pain but he was also given IV fluids.  Suffered from superficial skin tear on his arms for which bandage was placed.  This morning he does not have any complaints, he has had good 2-3 bowel movements over last 12 hours.  He feels much better.  Patient is medically stable to be discharged after he has a bowel movement today.  Body mass index is 23.71 kg/m.      Discharge Diagnoses:  Active Problems:   CKD (chronic kidney disease) stage 3, GFR 30-59 ml/min   Abdominal aortic aneurysm (AAA)  without rupture (HCC)   Arthritis of knee, degenerative   Benign essential HTN   CAD in native artery   S/P total knee arthroplasty   MGUS (monoclonal gammopathy of unknown significance)   Hip fracture (HCC)   Constipation   AKI (acute kidney injury) ([place])      Consultations:  Orthopedic  Subjective: Feels well but has not had bowel movement in last for 5 days therefore would like a bowel movement before he wants to get discharged.Marland Kitchen He is already ambulated once this morning with some assistance.  Discharge Exam: Vitals:   05/16/20 2316 05/17/20 1035  BP: 137/65 139/65  Pulse: 72 74  Resp: 17 18  Temp: 97.8 F (36.6 C) 97.7 F (36.5 C)  SpO2: 95% 100%   Vitals:   05/16/20 0721 05/16/20 1540 05/16/20 2316 05/17/20 1035  BP: 130/79 (!) 146/79 137/65 139/65  Pulse: 78 75 72 74  Resp: 16  17 18   Temp: 97.9 F (36.6 C)  97.8 F (36.6 C) 97.7 F (36.5 C)  TempSrc: Oral  Oral Oral  SpO2: 98% 99% 95% 100%  Weight:      Height:        General: Pt is alert, awake, not in acute distress Cardiovascular: RRR, S1/S2 +, no rubs, no gallops Respiratory: CTA bilaterally, no wheezing, no rhonchi Abdominal: Soft, NT, ND, bowel sounds + Extremities: no edema, no cyanosis Surgical site without any evidence of active bleeding  Discharge Instructions   Allergies as of 05/17/2020      Reactions   Morphine Anaphylaxis   Oxycodone Other (See Comments)   Hypotension and bradycardia  Carvedilol    Dizziness and syncope    Iodinated Diagnostic Agents    Other reaction(s): Asthenia (finding), Other (qualifier value) paralyzed legs      Medication List    TAKE these medications   amLODipine 5 MG tablet Commonly known as: NORVASC Take 5 mg by mouth daily.   docusate sodium 100 MG capsule Commonly known as: COLACE Take 1 capsule (100 mg total) by mouth 2 (two) times daily.   enoxaparin 40 MG/0.4ML injection Commonly known as: LOVENOX Inject 0.4 mLs (40 mg total) into  the skin daily for 14 days.   irbesartan 150 MG tablet Commonly known as: AVAPRO Take 1 tablet by mouth 2 (two) times daily.   melatonin 3 MG Tabs tablet Take 1.5 mg by mouth at bedtime as needed (sleep).   polyethylene glycol 17 g packet Commonly known as: MIRALAX / GLYCOLAX Take 17 g by mouth daily as needed for moderate constipation or severe constipation.   pravastatin 20 MG tablet Commonly known as: PRAVACHOL Take 20 mg by mouth every evening.   sodium bicarbonate 650 MG tablet Take 1,300 mg by mouth 2 (two) times daily.   traMADol-acetaminophen 37.5-325 MG tablet Commonly known as: ULTRACET Take 1-2 tablets by mouth every 6 (six) hours as needed for severe pain.       Contact information for follow-up providers    Baxter Hire, MD. Schedule an appointment as soon as possible for a visit in 1 week.   Specialty: Internal Medicine Why: Facility will make appt. Contact information: [place] [address] [phone]            Contact information for after-discharge care    Destination    [facility] Preferred SNF .   Service: Skilled Nursing Contact information: [place] [phone]                 Allergies  Allergen Reactions  . Morphine Anaphylaxis  . Oxycodone Other (See Comments)    Hypotension and bradycardia  . Carvedilol     Dizziness and syncope   . Iodinated Diagnostic Agents     Other reaction(s): Asthenia (finding), Other (qualifier value) paralyzed legs    You were cared for by a hospitalist during your hospital stay. If you have any questions about your discharge medications or the care you received while you were in the hospital after you are discharged, you can call the unit and asked to speak with the hospitalist on call if the hospitalist that took care of you is not available. Once you are discharged, your primary care physician will handle any  further medical issues. Please note that no refills for any discharge medications will be authorized once you are discharged, as it is imperative that you return to your primary care physician (or establish a relationship with a primary care physician if you do not have one) for your aftercare needs so that they can reassess your need for medications and monitor your lab values.   Procedures/Studies: DG Chest 1 View  Result Date: 05/11/2020 CLINICAL DATA:  Fall RIGHT hip pain EXAM: CHEST  1 VIEW COMPARISON:  03/28/2010 FINDINGS: Trachea midline. Post median sternotomy for CABG and aortic valve replacement with stable cardiac enlargement. Linear opacities over the LEFT hemidiaphragm similar to previous imaging. No lobar level consolidation. No signs of pleural effusion. On limited assessment skeletal structures without acute process. IMPRESSION: Linear opacities over the LEFT hemidiaphragm similar to previous  imaging may represent scarring or atelectasis. No acute cardiopulmonary disease. Electronically Signed   By: Zetta Bills M.D.   On: 05/11/2020 17:36   DG Abd 1 View  Result Date: 05/16/2020 CLINICAL DATA:  Abdominal distension, postop orthopedic surgery 05/12/2020 EXAM: ABDOMEN - 1 VIEW COMPARISON:  12/25/2008 FINDINGS: Two supine frontal views of the abdomen and pelvis are obtained. Moderate gas and stool are seen throughout the colon. No bowel obstruction. No masses or abnormal calcifications. The lung bases are clear. Postsurgical changes are seen from right femur ORIF. IMPRESSION: 1. Moderate gas and stool throughout the colon, which could reflect postoperative ileus. No evidence of bowel obstruction. Electronically Signed   By: Randa Ngo M.D.   On: 05/16/2020 19:05   CT Head Wo Contrast  Result Date: 05/11/2020 CLINICAL DATA:  Fall EXAM: CT HEAD WITHOUT CONTRAST CT CERVICAL SPINE WITHOUT CONTRAST TECHNIQUE: Multidetector CT imaging of the head and cervical spine was performed following  the standard protocol without intravenous contrast. Multiplanar CT image reconstructions of the cervical spine were also generated. COMPARISON:  CT brain 09/08/2009 FINDINGS: CT HEAD FINDINGS Brain: No acute territorial infarction, hemorrhage, or intracranial mass is visualized. Moderate atrophy. Mild hypodensity in the white matter consistent with chronic small vessel ischemic change. Nonenlarged ventricles. Vascular: No hyperdense vessels.  Carotid vascular calcification. Skull: Normal. Negative for fracture or focal lesion. Sinuses/Orbits: Mild mucosal thickening in the paranasal sinuses. Other: None CT CERVICAL SPINE FINDINGS Alignment: Facet alignment is maintained. Straightening of the cervical spine. Skull base and vertebrae: No acute fracture. No primary bone lesion or focal pathologic process. Soft tissues and spinal canal: No prevertebral fluid or swelling. No visible canal hematoma. Disc levels: Advanced diffuse degenerative changes throughout the cervical spine from C3 through C7 with diffuse disc space narrowing and osteophyte. Multiple level facet degenerative change with multiple level bilateral foraminal stenosis. Upper chest: Negative. Other: None IMPRESSION: 1. No CT evidence for acute intracranial abnormality. Atrophy and mild chronic small vessel ischemic changes of the white matter. 2. Straightening of the cervical spine with advanced degenerative changes. No acute osseous abnormality. Electronically Signed   By: Donavan Foil M.D.   On: 05/11/2020 17:49   CT Cervical Spine Wo Contrast  Result Date: 05/11/2020 CLINICAL DATA:  Fall EXAM: CT HEAD WITHOUT CONTRAST CT CERVICAL SPINE WITHOUT CONTRAST TECHNIQUE: Multidetector CT imaging of the head and cervical spine was performed following the standard protocol without intravenous contrast. Multiplanar CT image reconstructions of the cervical spine were also generated. COMPARISON:  CT brain 09/08/2009 FINDINGS: CT HEAD FINDINGS Brain: No acute  territorial infarction, hemorrhage, or intracranial mass is visualized. Moderate atrophy. Mild hypodensity in the white matter consistent with chronic small vessel ischemic change. Nonenlarged ventricles. Vascular: No hyperdense vessels.  Carotid vascular calcification. Skull: Normal. Negative for fracture or focal lesion. Sinuses/Orbits: Mild mucosal thickening in the paranasal sinuses. Other: None CT CERVICAL SPINE FINDINGS Alignment: Facet alignment is maintained. Straightening of the cervical spine. Skull base and vertebrae: No acute fracture. No primary bone lesion or focal pathologic process. Soft tissues and spinal canal: No prevertebral fluid or swelling. No visible canal hematoma. Disc levels: Advanced diffuse degenerative changes throughout the cervical spine from C3 through C7 with diffuse disc space narrowing and osteophyte. Multiple level facet degenerative change with multiple level bilateral foraminal stenosis. Upper chest: Negative. Other: None IMPRESSION: 1. No CT evidence for acute intracranial abnormality. Atrophy and mild chronic small vessel ischemic changes of the white matter. 2. Straightening of the cervical spine with  advanced degenerative changes. No acute osseous abnormality. Electronically Signed   By: Donavan Foil M.D.   On: 05/11/2020 17:49   DG HIP OPERATIVE UNILAT W OR W/O PELVIS RIGHT  Result Date: 05/12/2020 CLINICAL DATA:  Intramedullary rod fixation of right femur. EXAM: OPERATIVE right HIP (WITH PELVIS IF PERFORMED) 3 VIEWS TECHNIQUE: Fluoroscopic spot image(s) were submitted for interpretation post-operatively. FLUOROSCOPY TIME:  7.0734 mGy. COMPARISON:  May 11, 2020. FINDINGS: Three intraoperative fluoroscopic images were obtained of the right femur. These images demonstrate surgical internal fixation of proximal intertrochanteric fracture and intramedullary rod fixation of the right femur. IMPRESSION: Fluoroscopic guidance provided during surgical internal fixation of  proximal right femoral intertrochanteric fracture. Electronically Signed   By: Marijo Conception M.D.   On: 05/12/2020 16:32   DG Hip Unilat W or Wo Pelvis 2-3 Views Right  Result Date: 05/11/2020 CLINICAL DATA:  Right hip pain. Tripped over a case of water. Fall onto right leg. EXAM: DG HIP (WITH OR WITHOUT PELVIS) 2-3V RIGHT COMPARISON:  None. FINDINGS: Displaced intertrochanteric hip fracture with mild proximal migration of the femoral shaft. Mild apex anterior and lateral angulation. The femoral head remains seated. Pubic rami are intact. Bones are diffusely under mineralized. Degenerative change of the sacroiliac joints. Pubic symphysis is congruent. Surgical clips and enteric sutures noted in the lower abdomen. IMPRESSION: Displaced angulated intertrochanteric right hip fracture. Electronically Signed   By: Keith Rake M.D.   On: 05/11/2020 17:40     The results of significant diagnostics from this hospitalization (including imaging, microbiology, ancillary and laboratory) are listed below for reference.     Microbiology: Recent Results (from the past 240 hour(s))  SARS Coronavirus 2 by RT PCR (hospital order, performed in [facility] hospital lab) Nasopharyngeal Nasopharyngeal Swab     Status: None   Collection Time: 05/11/20  5:02 PM   Specimen: Nasopharyngeal Swab  Result Value Ref Range Status   SARS Coronavirus 2 NEGATIVE NEGATIVE Final    Comment: (NOTE) SARS-CoV-2 target nucleic acids are NOT DETECTED.  The SARS-CoV-2 RNA is generally detectable in upper and lower respiratory specimens during the acute phase of infection. The lowest concentration of SARS-CoV-2 viral copies this assay can detect is 250 copies / mL. A negative result does not preclude SARS-CoV-2 infection and should not be used as the sole basis for treatment or other patient management decisions.  A negative result may occur with improper specimen collection / handling, submission of specimen other than  nasopharyngeal swab, presence of viral mutation(s) within the areas targeted by this assay, and inadequate number of viral copies (<250 copies / mL). A negative result must be combined with clinical observations, patient history, and epidemiological information.  Fact Sheet for Patients:   StrictlyIdeas.no  Fact Sheet for Healthcare Providers: BankingDealers.co.za  This test is not yet approved or  cleared by the [place] FDA and has been authorized for detection and/or diagnosis of SARS-CoV-2 by FDA under an Emergency Use Authorization (EUA).  This EUA will remain in effect (meaning this test can be used) for the duration of the COVID-19 declaration under Section 564(b)(1) of the Act, 21 U.S.C. section 360bbb-3(b)(1), unless the authorization is terminated or revoked sooner.  Performed at [facility], [address] [place], [place] [address]   Surgical pcr screen     Status: None   Collection Time: 05/11/20 11:30 PM   Specimen: Nasal Mucosa; Nasal Swab  Result Value Ref Range Status   MRSA, PCR NEGATIVE NEGATIVE Final  Staphylococcus aureus NEGATIVE NEGATIVE Final    Comment: (NOTE) The Xpert SA Assay (FDA approved for NASAL specimens in patients 70 years of age and older), is one component of a comprehensive surveillance program. It is not intended to diagnose infection nor to guide or monitor treatment. Performed at [facility], [address] [place], [place] [address]      Labs: BNP (last 3 results) No results for input(s): BNP in the last 8760 hours. Basic Metabolic Panel: Recent Labs  Lab 05/13/20 0516 05/14/20 0428 05/15/20 0334 05/16/20 0521 05/17/20 0800  NA 125*  126* 127* 125* 127* 126*  K 6.1*  6.1* 4.8 4.2 4.1 4.2  CL 94*  96* 98 95* 96* 96*  CO2 22  24 23 23 22 23   GLUCOSE 104*  113* 91 104* 104* 112*  BUN 30*  30* 28* 34* 30* 24*  CREATININE 1.97*  2.00* 1.49*  1.45* 1.38* 1.34*  CALCIUM 7.9*  8.0* 7.8* 8.2* 8.5* 8.3*  MG 2.0 2.0 2.0  --  2.5*  PHOS 4.3 3.0 3.6  --   --    Liver Function Tests: Recent Labs  Lab 05/11/20 1702 05/13/20 0516 05/14/20 0428 05/15/20 0334  AST 22  --   --   --   ALT 13  --   --   --   ALKPHOS 61  --   --   --   BILITOT 1.1  --   --   --   PROT 6.7  --   --   --   ALBUMIN 3.8 3.0* 2.9* 3.0*   No results for input(s): LIPASE, AMYLASE in the last 168 hours. No results for input(s): AMMONIA in the last 168 hours. CBC: Recent Labs  Lab 05/11/20 1702 05/11/20 1702 05/12/20 0521 05/13/20 0516 05/14/20 0428 05/15/20 0334 05/16/20 0521  WBC 7.4   < > 10.9* 8.6 9.5 8.8 8.6  NEUTROABS 4.2  --   --   --   --   --   --   HGB 12.1*   < > 11.8* 10.4* 10.4* 10.1* 10.2*  HCT 34.7*   < > 32.2* 30.2* 28.6* 28.0* 29.3*  MCV 93.5   < > 89.7 95.3 92.0 90.9 94.2  PLT 179   < > 179 145* 133* 144* 164   < > = values in this interval not displayed.   Cardiac Enzymes: No results for input(s): CKTOTAL, CKMB, CKMBINDEX, TROPONINI in the last 168 hours. BNP: Invalid input(s): POCBNP CBG: Recent Labs  Lab 05/16/20 1541  GLUCAP 106*   D-Dimer No results for input(s): DDIMER in the last 72 hours. Hgb A1c No results for input(s): HGBA1C in the last 72 hours. Lipid Profile No results for input(s): CHOL, HDL, LDLCALC, TRIG, CHOLHDL, LDLDIRECT in the last 72 hours. Thyroid function studies No results for input(s): TSH, T4TOTAL, T3FREE, THYROIDAB in the last 72 hours.  Invalid input(s): FREET3 Anemia work up No results for input(s): VITAMINB12, FOLATE, FERRITIN, TIBC, IRON, RETICCTPCT in the last 72 hours. Urinalysis    Component Value Date/Time   COLORURINE STRAW (A) 03/21/2016 1507   APPEARANCEUR CLEAR (A) 03/21/2016 1507   APPEARANCEUR Clear 11/22/2015 0950   LABSPEC 1.005 03/21/2016 1507   PHURINE 7.0 03/21/2016 1507   GLUCOSEU NEGATIVE 03/21/2016 1507   HGBUR NEGATIVE 03/21/2016 1507   BILIRUBINUR NEGATIVE  03/21/2016 1507   BILIRUBINUR Negative 11/22/2015 0950   KETONESUR NEGATIVE 03/21/2016 1507   PROTEINUR NEGATIVE 03/21/2016 1507   UROBILINOGEN 1.0 02/16/2010 1336  NITRITE NEGATIVE 03/21/2016 1507   LEUKOCYTESUR NEGATIVE 03/21/2016 1507   LEUKOCYTESUR Negative 11/22/2015 0950   Sepsis Labs Invalid input(s): PROCALCITONIN,  WBC,  LACTICIDVEN Microbiology Recent Results (from the past 240 hour(s))  SARS Coronavirus 2 by RT PCR (hospital order, performed in [facility] hospital lab) Nasopharyngeal Nasopharyngeal Swab     Status: None   Collection Time: 05/11/20  5:02 PM   Specimen: Nasopharyngeal Swab  Result Value Ref Range Status   SARS Coronavirus 2 NEGATIVE NEGATIVE Final    Comment: (NOTE) SARS-CoV-2 target nucleic acids are NOT DETECTED.  The SARS-CoV-2 RNA is generally detectable in upper and lower respiratory specimens during the acute phase of infection. The lowest concentration of SARS-CoV-2 viral copies this assay can detect is 250 copies / mL. A negative result does not preclude SARS-CoV-2 infection and should not be used as the sole basis for treatment or other patient management decisions.  A negative result may occur with improper specimen collection / handling, submission of specimen other than nasopharyngeal swab, presence of viral mutation(s) within the areas targeted by this assay, and inadequate number of viral copies (<250 copies / mL). A negative result must be combined with clinical observations, patient history, and epidemiological information.  Fact Sheet for Patients:   StrictlyIdeas.no  Fact Sheet for Healthcare Providers: BankingDealers.co.za  This test is not yet approved or  cleared by the [place] FDA and has been authorized for detection and/or diagnosis of SARS-CoV-2 by FDA under an Emergency Use Authorization (EUA).  This EUA will remain in effect (meaning this test can be used) for the  duration of the COVID-19 declaration under Section 564(b)(1) of the Act, 21 U.S.C. section 360bbb-3(b)(1), unless the authorization is terminated or revoked sooner.  Performed at [facility], [address], [place], [place] [address]   Surgical pcr screen     Status: None   Collection Time: 05/11/20 11:30 PM   Specimen: Nasal Mucosa; Nasal Swab  Result Value Ref Range Status   MRSA, PCR NEGATIVE NEGATIVE Final   Staphylococcus aureus NEGATIVE NEGATIVE Final    Comment: (NOTE) The Xpert SA Assay (FDA approved for NASAL specimens in patients 54 years of age and older), is one component of a comprehensive surveillance program. It is not intended to diagnose infection nor to guide or monitor treatment. Performed at [facility], [address] [place], [place] [address]      Time coordinating discharge:  I have spent 35 minutes face to face with the patient and on the ward discussing the patients care, assessment, plan and disposition with other care givers. >50% of the time was devoted counseling the patient about the risks and benefits of treatment/Discharge disposition and coordinating care.   SIGNED:   Damita Lack, MD  [facility] 05/17/2020, 11:35 AM   If 7PM-7AM, please contact night-coverage

## 2020-05-16 NOTE — TOC Progression Note (Signed)
Transition of Care [facility]) - Progression Note    Patient Details  Name: [name] MRN: [id number] Date of Birth: 1931/05/23  Transition [facility] Care [facility]) CM/SW [place], RN Phone Number: 05/16/2020, 10:10 AM  Clinical Narrative:   RNCM met with patient at bedside to discuss bed offers. Medicare compare list was provided. Patient chooses [facility] in [place] accepted bed in hub and notified facility. Insurance Josem Kaufmann was received yesterday, left VM with Deb with [facility] to notify of bed acceptance.     Expected Discharge Plan: [place] Barriers to Discharge: No Barriers Identified  Expected Discharge Plan and Services Expected Discharge Plan: [place]     Post Acute Care Choice: [place]                                         Social Determinants of Health (SDOH) Interventions    Readmission Risk Interventions No flowsheet data found.

## 2020-05-16 NOTE — Care Management Important Message (Signed)
Important Message  Patient Details  Name: [name] MRN: [id number] Date of Birth: [birth year]   Medicare Important Message Given:  Yes     Dannette Barbara 05/16/2020, 1:12 PM

## 2020-05-16 NOTE — Progress Notes (Signed)
Pt bowel regimen held until Dr. Reesa Chew notifies Nursing staff, waiting on cxray results

## 2020-05-16 NOTE — Progress Notes (Signed)
  Subjective:  Patient reports pain as mild.    Objective:   VITALS:   Vitals:   05/14/20 2302 05/15/20 0759 05/16/20 0039 05/16/20 0721  BP: (!) 152/68 140/67 (!) 154/78 130/79  Pulse: 63 67 73 78  Resp: 18 17 16 16   Temp: 98 F (36.7 C) (!) 97.5 F (36.4 C) 97.7 F (36.5 C) 97.9 F (36.6 C)  TempSrc: Oral Oral Oral Oral  SpO2: 96% 98% 96% 98%  Weight:      Height:        PHYSICAL EXAM:  Neurologically intact ABD soft Neurovascular intact Sensation intact distally Intact pulses distally Dorsiflexion/Plantar flexion intact Incision: moderate drainage No cellulitis present Compartment soft dressing changed  LABS  Results for orders placed or performed during the hospital encounter of 05/11/20 (from the past 24 hour(s))  CBC     Status: Abnormal   Collection Time: 05/16/20  5:21 AM  Result Value Ref Range   WBC 8.6 4.0 - 10.5 K/uL   RBC 3.11 (L) 4.22 - 5.81 MIL/uL   Hemoglobin 10.2 (L) 13.0 - 17.0 g/dL   HCT 29.3 (L) 39 - 52 %   MCV 94.2 80.0 - 100.0 fL   MCH 32.8 26.0 - 34.0 pg   MCHC 34.8 30.0 - 36.0 g/dL   RDW 13.2 11.5 - 15.5 %   Platelets 164 150 - 400 K/uL   nRBC 0.0 0.0 - 0.2 %  Basic metabolic panel     Status: Abnormal   Collection Time: 05/16/20  5:21 AM  Result Value Ref Range   Sodium 127 (L) 135 - 145 mmol/L   Potassium 4.1 3.5 - 5.1 mmol/L   Chloride 96 (L) 98 - 111 mmol/L   CO2 22 22 - 32 mmol/L   Glucose, Bld 104 (H) 70 - 99 mg/dL   BUN 30 (H) 8 - 23 mg/dL   Creatinine, Ser 1.38 (H) 0.61 - 1.24 mg/dL   Calcium 8.5 (L) 8.9 - 10.3 mg/dL   GFR calc non Af Amer 46 (L) >60 mL/min   GFR calc Af Amer 53 (L) >60 mL/min   Anion gap 9 5 - 15    No results found.  Assessment/Plan: 4 Days Post-Op   Active Problems:   CKD (chronic kidney disease) stage 3, GFR 30-59 ml/min   Abdominal aortic aneurysm (AAA) without rupture (HCC)   Arthritis of knee, degenerative   Benign essential HTN   CAD in native artery   S/P total knee arthroplasty    MGUS (monoclonal gammopathy of unknown significance)   Hip fracture (HCC)   Constipation   AKI (acute kidney injury) ([place])   Advance diet Up with therapy Discharge to SNF per medicine Follow up with ortho on sept 7 for staple removal. Call for appt [phone] Lovenox 40mg  daily, last dose September 9 WBAT RLE Continue bowel regimen for discharge   Carlynn Spry , PA-C 05/16/2020, 3:30 PM

## 2020-05-16 NOTE — Progress Notes (Signed)
RN to room due to commotion heard down the hall. Pt was walking with PT and became weak and fainted. RNs and PT got pt back to bed, Rapid Response called. VS taken, pt's bed in trendelenburg. Pt slowly but surely becoming more responsive. Vs stable, Pt was alert and aware of situation after 2 minutes. MD odered cxray and fluid bolus. Pt's daughter notified. Pt received to skin tears on LFA when putting into bed. RN cleaned and dressed tears. Pt call bell and phone next to pt. Pt denies any further needs at this time. RN continues to monitor

## 2020-05-16 NOTE — Significant Event (Signed)
Rapid Response Event Note   Reason for Call :   Near Syncope  Initial Focused Assessment:   Patient is in bed. AA+Ox4. No focal neuro deficits. VS WNL and stable. Skin tear to LUE.  Interventions:   MD paged. Orders entered for NS bolus and MIVF.  Plan of Care:   Remain in 135.  Event Summary:   MD Notified: Dr. Reesa Chew Call Time: 5784 hrs Arrival Time: 1538 hrs End Time: 1601 hrs.   Jesse Sans, RN

## 2020-05-16 NOTE — Progress Notes (Signed)
RN spoke with MD about pt and family's concerns regarding past medical hx and bowel regimen, per MD hold off on regimen until cxray results.

## 2020-05-16 NOTE — Progress Notes (Signed)
Ch arrived at room in response to Rapid Response page. Ch was being told that Pt was back stable again. Family not around at this time.

## 2020-05-16 NOTE — Progress Notes (Signed)
Physical Therapy Treatment Patient Details Name: [name] MRN: [id number] DOB: December 21, 1930 Today's Date: 05/16/2020    History of Present Illness 86 y/o male who suffered a fall with R hip fx. Subsequent IM nailing 8/26.    PT Comments    Pt in recliner, awaiting BM to transfer to rehab.  Offered gait to stimulate bowel and he agrees.  Stood with min a x 1.  He walks about 10' in room with RW and min a x 1 before stopping.  When asked if he is ok he stated "Get a chair".  Looked and pt and eyes looking up and to right, body generally stiff.  Called to staff for assist and they responded and brought chair behind pt.  He was assisted to chair as he was generally unresponsive and able to initiate movement.  No fall but pt did receive significant skin tear R arm when transferred back to bed. Staff transferred pt with full body lift to bed and rapid called.  Team arrived and pt left in nursing and response team care.  See nursing notes for vitals and nursing description of event.   Pt awake upon leaving room and talkative stating he feels "weak" and "cold".  Pt has had no difficulty with gait prior or episodes.  Recommend +2 assist tomorrow with gait out of precaution.    Follow Up Recommendations  SNF     Equipment Recommendations  None recommended by PT    Recommendations for Other Services       Precautions / Restrictions Precautions Precautions: Fall Restrictions Weight Bearing Restrictions: Yes RLE Weight Bearing: Weight bearing as tolerated    Mobility  Bed Mobility                  Transfers Overall transfer level: Needs assistance Equipment used: Rolling walker (2 wheeled) Transfers: Sit to/from Stand Sit to Stand: Min assist            Ambulation/Gait Ambulation/Gait assistance: Min assist Gait Distance (Feet): 10 Feet Assistive device: Rolling walker (2 wheeled) Gait Pattern/deviations: Step-to pattern;Antalgic;Trunk flexed;Narrow base of  support     General Gait Details: medical event during gait   Stairs             Wheelchair Mobility    Modified Rankin (Stroke Patients Only)       Balance Overall balance assessment: Needs assistance   Sitting balance-Leahy Scale: Good     Standing balance support: Bilateral upper extremity supported Standing balance-Leahy Scale: Fair Standing balance comment: Heavy support on RW                             Cognition Arousal/Alertness: Awake/alert Behavior During Therapy: WFL for tasks assessed/performed Overall Cognitive Status: Within Functional Limits for tasks assessed                                        Exercises      General Comments        Pertinent Vitals/Pain Pain Assessment: Faces Faces Pain Scale: Hurts little more Pain Location: hip Pain Descriptors / Indicators: Sore Pain Intervention(s): Limited activity within patient's tolerance;Monitored during session;Repositioned    Home Living                      Prior Function  PT Goals (current goals can now be found in the care plan section) Progress towards PT goals: Progressing toward goals    Frequency    BID      PT Plan Current plan remains appropriate    Co-evaluation              AM-PAC PT "6 Clicks" Mobility   Outcome Measure  Help needed turning from your back to your side while in a flat bed without using bedrails?: A Little Help needed moving from lying on your back to sitting on the side of a flat bed without using bedrails?: A Little Help needed moving to and from a bed to a chair (including a wheelchair)?: A Little Help needed standing up from a chair using your arms (e.g., wheelchair or bedside chair)?: A Little Help needed to walk in hospital room?: A Little Help needed climbing 3-5 steps with a railing? : A Lot 6 Click Score: 17    End of Session Equipment Utilized During Treatment: Gait belt Activity  Tolerance: Treatment limited secondary to medical complications (Comment) Patient left: in bed;with nursing/sitter in room (refusing chair alarm) Nurse Communication: Mobility status Pain - Right/Left: Right Pain - part of body: Hip     Time: 4069-8614 PT Time Calculation (min) (ACUTE ONLY): 11 min  Charges:  $Gait Training: 8-22 mins                    Chesley Noon, PTA 05/16/20, 3:53 PM

## 2020-05-16 NOTE — Progress Notes (Signed)
Physical Therapy Treatment Patient Details Name: DIVINE HANSLEY MRN: [id number] DOB: [birth year] Today's Date: 05/16/2020    History of Present Illness 86 y/o male who suffered a fall with R hip fx. Subsequent IM nailing 8/26.    PT Comments    Pt ready for session.  Reports increased soreness today.  Stated he is hesitant to take pain meds out of concern for his kidneys.  Encouraged pt to talk to MD and RN about concerns and options.  Stood and he is able to progress gait to nursing station this am.  Overall improved walker placement and balance noted today but remains with +1 assist for safety.  Participated in exercises as described below.  SNF remains an appropriate discharge plan.   Follow Up Recommendations  SNF     Equipment Recommendations  None recommended by PT    Recommendations for Other Services       Precautions / Restrictions Precautions Precautions: Fall Restrictions Weight Bearing Restrictions: Yes RLE Weight Bearing: Weight bearing as tolerated    Mobility  Bed Mobility               General bed mobility comments: pt was in recliner pre/post session  Transfers Overall transfer level: Needs assistance Equipment used: Rolling walker (2 wheeled)   Sit to Stand: Min assist         General transfer comment: stands with min a x 1. small buckling during each standing attempt but mostly able to recover without assist.  Ambulation/Gait Ambulation/Gait assistance: Min assist Gait Distance (Feet): 140 Feet Assistive device: Rolling walker (2 wheeled) Gait Pattern/deviations: Step-to pattern;Antalgic;Trunk flexed;Narrow base of support Gait velocity: decreased   General Gait Details: Progressed gait to nursing station   Stairs             Wheelchair Mobility    Modified Rankin (Stroke Patients Only)       Balance Overall balance assessment: Needs assistance   Sitting balance-Leahy Scale: Good     Standing balance support:  Bilateral upper extremity supported Standing balance-Leahy Scale: Fair Standing balance comment: Heavy support on RW                             Cognition Arousal/Alertness: Awake/alert Behavior During Therapy: WFL for tasks assessed/performed Overall Cognitive Status: Within Functional Limits for tasks assessed                                 General Comments: Pt is alert and motivated but requires extra time      Exercises Other Exercises Other Exercises: seated LAQ, ankle pumps.  standing SLR and marches x 10    General Comments        Pertinent Vitals/Pain Pain Assessment: Faces Faces Pain Scale: Hurts little more Pain Location: hip Pain Descriptors / Indicators: Sore Pain Intervention(s): Limited activity within patient's tolerance;Monitored during session;Repositioned    Home Living                      Prior Function            PT Goals (current goals can now be found in the care plan section) Progress towards PT goals: Progressing toward goals    Frequency    BID      PT Plan Current plan remains appropriate    Co-evaluation  AM-PAC PT "6 Clicks" Mobility   Outcome Measure  Help needed turning from your back to your side while in a flat bed without using bedrails?: A Little Help needed moving from lying on your back to sitting on the side of a flat bed without using bedrails?: A Little Help needed moving to and from a bed to a chair (including a wheelchair)?: A Little Help needed standing up from a chair using your arms (e.g., wheelchair or bedside chair)?: A Little Help needed to walk in hospital room?: A Little Help needed climbing 3-5 steps with a railing? : A Lot 6 Click Score: 17    End of Session Equipment Utilized During Treatment: Gait belt Activity Tolerance: Patient tolerated treatment well Patient left: in chair;with call bell/phone within reach (refusing chair alarm) Nurse  Communication: Mobility status Pain - Right/Left: Right Pain - part of body: Hip     Time: 4360-6770 PT Time Calculation (min) (ACUTE ONLY): 19 min  Charges:  $Gait Training: 8-22 mins                    Chesley Noon, PTA 05/16/20, 10:35 AM

## 2020-05-17 ENCOUNTER — Inpatient Hospital Stay: Payer: PPO

## 2020-05-17 DIAGNOSIS — D472 Monoclonal gammopathy: Secondary | ICD-10-CM | POA: Diagnosis not present

## 2020-05-17 DIAGNOSIS — K5900 Constipation, unspecified: Secondary | ICD-10-CM | POA: Diagnosis not present

## 2020-05-17 DIAGNOSIS — S79929A Unspecified injury of unspecified thigh, initial encounter: Secondary | ICD-10-CM | POA: Diagnosis not present

## 2020-05-17 DIAGNOSIS — Z4789 Encounter for other orthopedic aftercare: Secondary | ICD-10-CM | POA: Diagnosis not present

## 2020-05-17 DIAGNOSIS — Z96651 Presence of right artificial knee joint: Secondary | ICD-10-CM | POA: Diagnosis not present

## 2020-05-17 DIAGNOSIS — M25561 Pain in right knee: Secondary | ICD-10-CM | POA: Diagnosis not present

## 2020-05-17 DIAGNOSIS — N179 Acute kidney failure, unspecified: Secondary | ICD-10-CM | POA: Diagnosis not present

## 2020-05-17 DIAGNOSIS — Z96659 Presence of unspecified artificial knee joint: Secondary | ICD-10-CM | POA: Diagnosis not present

## 2020-05-17 DIAGNOSIS — R262 Difficulty in walking, not elsewhere classified: Secondary | ICD-10-CM | POA: Diagnosis not present

## 2020-05-17 DIAGNOSIS — Z7901 Long term (current) use of anticoagulants: Secondary | ICD-10-CM | POA: Diagnosis not present

## 2020-05-17 DIAGNOSIS — M1731 Unilateral post-traumatic osteoarthritis, right knee: Secondary | ICD-10-CM | POA: Diagnosis not present

## 2020-05-17 DIAGNOSIS — E785 Hyperlipidemia, unspecified: Secondary | ICD-10-CM | POA: Diagnosis not present

## 2020-05-17 DIAGNOSIS — I714 Abdominal aortic aneurysm, without rupture: Secondary | ICD-10-CM | POA: Diagnosis not present

## 2020-05-17 DIAGNOSIS — Z471 Aftercare following joint replacement surgery: Secondary | ICD-10-CM | POA: Diagnosis not present

## 2020-05-17 DIAGNOSIS — E871 Hypo-osmolality and hyponatremia: Secondary | ICD-10-CM | POA: Diagnosis not present

## 2020-05-17 DIAGNOSIS — I10 Essential (primary) hypertension: Secondary | ICD-10-CM | POA: Diagnosis not present

## 2020-05-17 DIAGNOSIS — S72001D Fracture of unspecified part of neck of right femur, subsequent encounter for closed fracture with routine healing: Secondary | ICD-10-CM | POA: Diagnosis not present

## 2020-05-17 DIAGNOSIS — N1832 Chronic kidney disease, stage 3b: Secondary | ICD-10-CM | POA: Diagnosis not present

## 2020-05-17 DIAGNOSIS — M6281 Muscle weakness (generalized): Secondary | ICD-10-CM | POA: Diagnosis not present

## 2020-05-17 DIAGNOSIS — S7291XA Unspecified fracture of right femur, initial encounter for closed fracture: Secondary | ICD-10-CM | POA: Diagnosis not present

## 2020-05-17 DIAGNOSIS — Z7401 Bed confinement status: Secondary | ICD-10-CM | POA: Diagnosis not present

## 2020-05-17 DIAGNOSIS — K5903 Drug induced constipation: Secondary | ICD-10-CM | POA: Diagnosis not present

## 2020-05-17 DIAGNOSIS — E7849 Other hyperlipidemia: Secondary | ICD-10-CM | POA: Diagnosis not present

## 2020-05-17 DIAGNOSIS — M2550 Pain in unspecified joint: Secondary | ICD-10-CM | POA: Diagnosis not present

## 2020-05-17 DIAGNOSIS — I2510 Atherosclerotic heart disease of native coronary artery without angina pectoris: Secondary | ICD-10-CM | POA: Diagnosis not present

## 2020-05-17 DIAGNOSIS — S72001G Fracture of unspecified part of neck of right femur, subsequent encounter for closed fracture with delayed healing: Secondary | ICD-10-CM | POA: Diagnosis not present

## 2020-05-17 LAB — BASIC METABOLIC PANEL
Anion gap: 7 (ref 5–15)
BUN: 24 mg/dL — ABNORMAL HIGH (ref 8–23)
CO2: 23 mmol/L (ref 22–32)
Calcium: 8.3 mg/dL — ABNORMAL LOW (ref 8.9–10.3)
Chloride: 96 mmol/L — ABNORMAL LOW (ref 98–111)
Creatinine, Ser: 1.34 mg/dL — ABNORMAL HIGH (ref 0.61–1.24)
GFR calc Af Amer: 55 mL/min — ABNORMAL LOW (ref >60)
GFR calc non Af Amer: 47 mL/min — ABNORMAL LOW (ref >60)
Glucose, Bld: 112 mg/dL — ABNORMAL HIGH (ref 70–99)
Potassium: 4.2 mmol/L (ref 3.5–5.1)
Sodium: 126 mmol/L — ABNORMAL LOW (ref 135–145)

## 2020-05-17 LAB — MAGNESIUM: Magnesium: 2.5 mg/dL — ABNORMAL HIGH (ref 1.7–2.4)

## 2020-05-17 NOTE — Progress Notes (Signed)
Discharge Note: Contacted/reported to facility nurse, Darrold Span, at [facility]. Reviewed discharge instructions. Facility staff verbalized understanding. NT will obtained Vitals upon d/c. Will be transported via EMS.

## 2020-05-17 NOTE — Progress Notes (Signed)
Physical Therapy Treatment Patient Details Name: [name] MRN: [id number] DOB: August 14, 1931 Today's Date: 05/17/2020    History of Present Illness 86 y/o male who suffered a fall with R hip fx. Subsequent IM nailing 8/26.    PT Comments    Pt was sitting in recliner upon arriving. He agrees to session and is cooperative and pleasant throughout. Continues to endorse pain but did not limit pt's abilities this date. He stood to RW with CGA and ambualted 100 ft into hallway without difficulty or unsteadiness. He endorses fatigue but overall did well. At conclusion of session, pt was sitting in recliner with call bell in reach and RN aware of pt's abilities. Will benefit from SNF at [place] to assist pt to PLOF.     Follow Up Recommendations  SNF     Equipment Recommendations  None recommended by PT    Recommendations for Other Services       Precautions / Restrictions Precautions Precautions: Fall Restrictions Weight Bearing Restrictions: No RLE Weight Bearing: Weight bearing as tolerated    Mobility  Bed Mobility               General bed mobility comments: pt was in recliner pre/post session  Transfers Overall transfer level: Needs assistance Equipment used: Rolling walker (2 wheeled) Transfers: Sit to/from Stand Sit to Stand: Min guard         General transfer comment: CGA for safety to STS 2 x from recliner.   Ambulation/Gait Ambulation/Gait assistance: Min guard Gait Distance (Feet): 100 Feet Assistive device: Rolling walker (2 wheeled) Gait Pattern/deviations: Step-through pattern;Trunk flexed Gait velocity: decreased   General Gait Details: pt reports feeling well throughout ambulation this date. was able to ambulate ~ 100 ft with RW + chair follow for safety. Ambulated until endorses fatigue.   Stairs             Wheelchair Mobility    Modified Rankin (Stroke Patients Only)       Balance Overall balance assessment: Needs  assistance Sitting-balance support: Feet supported Sitting balance-Leahy Scale: Good Sitting balance - Comments: no LOB in sitting   Standing balance support: Bilateral upper extremity supported Standing balance-Leahy Scale: Fair Standing balance comment: Heavy support on RW                             Cognition Arousal/Alertness: Awake/alert Behavior During Therapy: WFL for tasks assessed/performed Overall Cognitive Status: Within Functional Limits for tasks assessed                                 General Comments: Pt is alert and motivated but requires extra time      Exercises      General Comments        Pertinent Vitals/Pain Pain Assessment: 0-10 Pain Score: 6  Faces Pain Scale: Hurts a little bit Pain Location: hip Pain Descriptors / Indicators: Sore Pain Intervention(s): Limited activity within patient's tolerance;Monitored during session;Repositioned;Ice applied    Home Living                      Prior Function            PT Goals (current goals can now be found in the care plan section) Acute Rehab PT Goals Patient Stated Goal: go home after rehab Progress towards PT goals: Progressing toward goals  Frequency    BID      PT Plan Current plan remains appropriate    Co-evaluation              AM-PAC PT "6 Clicks" Mobility   Outcome Measure  Help needed turning from your back to your side while in a flat bed without using bedrails?: A Little Help needed moving from lying on your back to sitting on the side of a flat bed without using bedrails?: A Little Help needed moving to and from a bed to a chair (including a wheelchair)?: A Little Help needed standing up from a chair using your arms (e.g., wheelchair or bedside chair)?: A Little Help needed to walk in hospital room?: A Little Help needed climbing 3-5 steps with a railing? : A Little 6 Click Score: 18    End of Session Equipment Utilized During  Treatment: Gait belt Activity Tolerance: Patient tolerated treatment well Patient left: in chair;with call bell/phone within reach;with chair alarm set Nurse Communication: Mobility status PT Visit Diagnosis: Muscle weakness (generalized) (M62.81);Difficulty in walking, not elsewhere classified (R26.2);Pain;Unsteadiness on feet (R26.81) Pain - Right/Left: Right Pain - part of body: Hip     Time: 1012-1027 PT Time Calculation (min) (ACUTE ONLY): 15 min  Charges:  $Gait Training: 8-22 mins                     Julaine Fusi PTA 05/17/20, 1:02 PM

## 2020-05-17 NOTE — Progress Notes (Signed)
Discharge was held yesterday as patient did not feel comfortable leaving the hospital without having bowel movement for several days.  X-ray showed some postop ileus.  He was given bowel regimen which helped him and he had 2-3 bowel movements overnight and now feels much better.  He also had a presyncope episode yesterday secondary to what he says from his knee pain which caused minor superficial skin tear.  This morning he feels great, vital signs remained stable.  I extensively spoke with the patient at bedside and his daughter over the phone.  All the questions answered.  Medically stable for discharge.  Spoke with patient's RN.  Time spent-25 minutes  Gerlean Ren MD [facility]

## 2020-05-17 NOTE — Progress Notes (Signed)
  Subjective:  Patient reports pain as mild.  Had multiple BM's  Objective:   VITALS:   Vitals:   05/16/20 0721 05/16/20 1540 05/16/20 2316 05/17/20 1035  BP: 130/79 (!) 146/79 137/65 139/65  Pulse: 78 75 72 74  Resp: 16  17 18   Temp: 97.9 F (36.6 C)  97.8 F (36.6 C) 97.7 F (36.5 C)  TempSrc: Oral  Oral Oral  SpO2: 98% 99% 95% 100%  Weight:      Height:        PHYSICAL EXAM:  ABD soft Neurovascular intact Incision: dressing C/D/I  LABS  Results for orders placed or performed during the hospital encounter of 05/11/20 (from the past 24 hour(s))  Glucose, capillary     Status: Abnormal   Collection Time: 05/16/20  3:41 PM  Result Value Ref Range   Glucose-Capillary 106 (H) 70 - 99 mg/dL  Basic metabolic panel     Status: Abnormal   Collection Time: 05/17/20  8:00 AM  Result Value Ref Range   Sodium 126 (L) 135 - 145 mmol/L   Potassium 4.2 3.5 - 5.1 mmol/L   Chloride 96 (L) 98 - 111 mmol/L   CO2 23 22 - 32 mmol/L   Glucose, Bld 112 (H) 70 - 99 mg/dL   BUN 24 (H) 8 - 23 mg/dL   Creatinine, Ser 1.34 (H) 0.61 - 1.24 mg/dL   Calcium 8.3 (L) 8.9 - 10.3 mg/dL   GFR calc non Af Amer 47 (L) >60 mL/min   GFR calc Af Amer 55 (L) >60 mL/min   Anion gap 7 5 - 15  Magnesium     Status: Abnormal   Collection Time: 05/17/20  8:00 AM  Result Value Ref Range   Magnesium 2.5 (H) 1.7 - 2.4 mg/dL    DG Abd 1 View  Result Date: 05/16/2020 CLINICAL DATA:  Abdominal distension, postop orthopedic surgery 05/12/2020 EXAM: ABDOMEN - 1 VIEW COMPARISON:  12/25/2008 FINDINGS: Two supine frontal views of the abdomen and pelvis are obtained. Moderate gas and stool are seen throughout the colon. No bowel obstruction. No masses or abnormal calcifications. The lung bases are clear. Postsurgical changes are seen from right femur ORIF. IMPRESSION: 1. Moderate gas and stool throughout the colon, which could reflect postoperative ileus. No evidence of bowel obstruction. Electronically Signed   By:  Randa Ngo M.D.   On: 05/16/2020 19:05    Assessment/Plan: 5 Days Post-Op   Active Problems:   CKD (chronic kidney disease) stage 3, GFR 30-59 ml/min   Abdominal aortic aneurysm (AAA) without rupture (HCC)   Arthritis of knee, degenerative   Benign essential HTN   CAD in native artery   S/P total knee arthroplasty   MGUS (monoclonal gammopathy of unknown significance)   Hip fracture (HCC)   Constipation   AKI (acute kidney injury) [facility])   Discharge to SNF  RTC in 7 to 10 days for staple removal   Lovell Sheehan , MD 05/17/2020, 12:55 PM

## 2020-05-17 NOTE — TOC Transition Note (Signed)
Transition of Care [facility]) - CM/SW Discharge Note   Patient Details  Name: [name] MRN: [id number] Date of Birth: Jan 24, 1931  Transition of [facility] CM/SW Contact:  Shelbie Ammons, RN Phone Number: 05/17/2020, 3:08 PM   Clinical Narrative:   Patient going to [facility] today, EMS paperwork updated and called for transport. Patient notified that MD has looked at knee x-ray and cleared him for discharge. Patient is in agreement with d/c to facility today.     Final next level of care: [place] Barriers to Discharge: No Barriers Identified   Patient Goals and CMS Choice     Choice offered to / list presented to : Patient  Discharge Placement                       Discharge Plan and Services     Post Acute Care Choice: [place]                               Social Determinants of Health (SDOH) Interventions     Readmission Risk Interventions No flowsheet data found.

## 2020-05-17 NOTE — Progress Notes (Deleted)
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 D/C with personal belongings/incentive spirometer

## 2020-05-17 NOTE — Progress Notes (Signed)
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 D/C with personal belongings/incentive spirometer

## 2020-05-19 DIAGNOSIS — I10 Essential (primary) hypertension: Secondary | ICD-10-CM | POA: Diagnosis not present

## 2020-05-19 DIAGNOSIS — S72001D Fracture of unspecified part of neck of right femur, subsequent encounter for closed fracture with routine healing: Secondary | ICD-10-CM | POA: Diagnosis not present

## 2020-05-19 DIAGNOSIS — I2510 Atherosclerotic heart disease of native coronary artery without angina pectoris: Secondary | ICD-10-CM | POA: Diagnosis not present

## 2020-05-19 DIAGNOSIS — E785 Hyperlipidemia, unspecified: Secondary | ICD-10-CM | POA: Diagnosis not present

## 2020-05-19 DIAGNOSIS — K5900 Constipation, unspecified: Secondary | ICD-10-CM | POA: Diagnosis not present

## 2020-05-24 DIAGNOSIS — M25561 Pain in right knee: Secondary | ICD-10-CM | POA: Diagnosis not present

## 2020-05-24 DIAGNOSIS — S7291XA Unspecified fracture of right femur, initial encounter for closed fracture: Secondary | ICD-10-CM | POA: Diagnosis not present

## 2020-05-25 NOTE — Anesthesia Postprocedure Evaluation (Signed)
Anesthesia Post Note  Patient: [name]  Procedure(s) Performed: INTRAMEDULLARY (IM) NAIL INTERTROCHANTRIC (Right )  Patient location during evaluation: PACU Anesthesia Type: Spinal Level of consciousness: awake and alert Pain management: pain level controlled Vital Signs Assessment: post-procedure vital signs reviewed and stable Respiratory status: spontaneous breathing, nonlabored ventilation, respiratory function stable and patient connected to nasal cannula oxygen Cardiovascular status: blood pressure returned to baseline and stable Postop Assessment: no apparent nausea or vomiting Anesthetic complications: no   No complications documented.   Last Vitals:  Vitals:   05/17/20 1035 05/17/20 1611  BP: 139/65 (!) 155/74  Pulse: 74 73  Resp: 18 16  Temp: 36.5 C 36.5 C  SpO2: 100% 100%    Last Pain:  Vitals:   05/17/20 1611  TempSrc: Oral  PainSc:                  Molli Barrows

## 2020-05-27 DIAGNOSIS — K5903 Drug induced constipation: Secondary | ICD-10-CM | POA: Diagnosis not present

## 2020-05-31 DIAGNOSIS — E785 Hyperlipidemia, unspecified: Secondary | ICD-10-CM | POA: Diagnosis not present

## 2020-05-31 DIAGNOSIS — E871 Hypo-osmolality and hyponatremia: Secondary | ICD-10-CM | POA: Diagnosis not present

## 2020-05-31 DIAGNOSIS — S72001D Fracture of unspecified part of neck of right femur, subsequent encounter for closed fracture with routine healing: Secondary | ICD-10-CM | POA: Diagnosis not present

## 2020-05-31 DIAGNOSIS — Z7901 Long term (current) use of anticoagulants: Secondary | ICD-10-CM | POA: Diagnosis not present

## 2020-05-31 DIAGNOSIS — I2510 Atherosclerotic heart disease of native coronary artery without angina pectoris: Secondary | ICD-10-CM | POA: Diagnosis not present

## 2020-05-31 DIAGNOSIS — I10 Essential (primary) hypertension: Secondary | ICD-10-CM | POA: Diagnosis not present

## 2020-05-31 DIAGNOSIS — K5900 Constipation, unspecified: Secondary | ICD-10-CM | POA: Diagnosis not present

## 2020-06-02 DIAGNOSIS — Z9181 History of falling: Secondary | ICD-10-CM | POA: Diagnosis not present

## 2020-06-02 DIAGNOSIS — I10 Essential (primary) hypertension: Secondary | ICD-10-CM | POA: Diagnosis not present

## 2020-06-02 DIAGNOSIS — I2510 Atherosclerotic heart disease of native coronary artery without angina pectoris: Secondary | ICD-10-CM | POA: Diagnosis not present

## 2020-06-02 DIAGNOSIS — E569 Vitamin deficiency, unspecified: Secondary | ICD-10-CM | POA: Diagnosis not present

## 2020-06-02 DIAGNOSIS — S72001D Fracture of unspecified part of neck of right femur, subsequent encounter for closed fracture with routine healing: Secondary | ICD-10-CM | POA: Diagnosis not present

## 2020-06-02 DIAGNOSIS — E785 Hyperlipidemia, unspecified: Secondary | ICD-10-CM | POA: Diagnosis not present

## 2020-06-06 DIAGNOSIS — E569 Vitamin deficiency, unspecified: Secondary | ICD-10-CM | POA: Diagnosis not present

## 2020-06-06 DIAGNOSIS — S72001D Fracture of unspecified part of neck of right femur, subsequent encounter for closed fracture with routine healing: Secondary | ICD-10-CM | POA: Diagnosis not present

## 2020-06-06 DIAGNOSIS — I10 Essential (primary) hypertension: Secondary | ICD-10-CM | POA: Diagnosis not present

## 2020-06-06 DIAGNOSIS — Z9181 History of falling: Secondary | ICD-10-CM | POA: Diagnosis not present

## 2020-06-06 DIAGNOSIS — E785 Hyperlipidemia, unspecified: Secondary | ICD-10-CM | POA: Diagnosis not present

## 2020-06-06 DIAGNOSIS — I2510 Atherosclerotic heart disease of native coronary artery without angina pectoris: Secondary | ICD-10-CM | POA: Diagnosis not present

## 2020-06-07 DIAGNOSIS — E785 Hyperlipidemia, unspecified: Secondary | ICD-10-CM | POA: Diagnosis not present

## 2020-06-07 DIAGNOSIS — I10 Essential (primary) hypertension: Secondary | ICD-10-CM | POA: Diagnosis not present

## 2020-06-07 DIAGNOSIS — E569 Vitamin deficiency, unspecified: Secondary | ICD-10-CM | POA: Diagnosis not present

## 2020-06-07 DIAGNOSIS — S72001D Fracture of unspecified part of neck of right femur, subsequent encounter for closed fracture with routine healing: Secondary | ICD-10-CM | POA: Diagnosis not present

## 2020-06-07 DIAGNOSIS — Z9181 History of falling: Secondary | ICD-10-CM | POA: Diagnosis not present

## 2020-06-07 DIAGNOSIS — I2510 Atherosclerotic heart disease of native coronary artery without angina pectoris: Secondary | ICD-10-CM | POA: Diagnosis not present

## 2020-06-08 DIAGNOSIS — I255 Ischemic cardiomyopathy: Secondary | ICD-10-CM | POA: Diagnosis not present

## 2020-06-08 DIAGNOSIS — N1831 Chronic kidney disease, stage 3a: Secondary | ICD-10-CM | POA: Diagnosis not present

## 2020-06-08 DIAGNOSIS — Z8781 Personal history of (healed) traumatic fracture: Secondary | ICD-10-CM | POA: Diagnosis not present

## 2020-06-08 DIAGNOSIS — Z09 Encounter for follow-up examination after completed treatment for conditions other than malignant neoplasm: Secondary | ICD-10-CM | POA: Diagnosis not present

## 2020-06-08 DIAGNOSIS — I2510 Atherosclerotic heart disease of native coronary artery without angina pectoris: Secondary | ICD-10-CM | POA: Diagnosis not present

## 2020-06-10 ENCOUNTER — Ambulatory Visit
Admission: RE | Admit: 2020-06-10 | Discharge: 2020-06-10 | Disposition: A | Discharge status: Home / Self Care | Payer: PPO | Source: Ambulatory Visit | Attending: Student | Admitting: Student

## 2020-06-10 ENCOUNTER — Other Ambulatory Visit: Payer: Self-pay | Admitting: Student

## 2020-06-10 ENCOUNTER — Other Ambulatory Visit: Payer: Self-pay

## 2020-06-10 DIAGNOSIS — R2242 Localized swelling, mass and lump, left lower limb: Secondary | ICD-10-CM | POA: Diagnosis not present

## 2020-06-10 DIAGNOSIS — R2241 Localized swelling, mass and lump, right lower limb: Secondary | ICD-10-CM | POA: Diagnosis not present

## 2020-06-10 DIAGNOSIS — M7989 Other specified soft tissue disorders: Secondary | ICD-10-CM | POA: Diagnosis not present

## 2020-06-21 DIAGNOSIS — S7291XA Unspecified fracture of right femur, initial encounter for closed fracture: Secondary | ICD-10-CM | POA: Diagnosis not present

## 2020-06-23 DIAGNOSIS — Z8781 Personal history of (healed) traumatic fracture: Secondary | ICD-10-CM | POA: Diagnosis not present

## 2020-06-23 DIAGNOSIS — M79604 Pain in right leg: Secondary | ICD-10-CM | POA: Diagnosis not present

## 2020-06-23 DIAGNOSIS — R262 Difficulty in walking, not elsewhere classified: Secondary | ICD-10-CM | POA: Diagnosis not present

## 2020-06-23 DIAGNOSIS — L89312 Pressure ulcer of right buttock, stage 2: Secondary | ICD-10-CM | POA: Diagnosis not present

## 2020-06-23 DIAGNOSIS — L89322 Pressure ulcer of left buttock, stage 2: Secondary | ICD-10-CM | POA: Diagnosis not present

## 2020-06-23 DIAGNOSIS — R29898 Other symptoms and signs involving the musculoskeletal system: Secondary | ICD-10-CM | POA: Diagnosis not present

## 2020-06-23 DIAGNOSIS — Z9889 Other specified postprocedural states: Secondary | ICD-10-CM | POA: Diagnosis not present

## 2020-06-27 ENCOUNTER — Ambulatory Visit: Payer: PPO | Admitting: Dermatology

## 2020-06-27 ENCOUNTER — Other Ambulatory Visit: Payer: Self-pay

## 2020-06-27 DIAGNOSIS — Z8781 Personal history of (healed) traumatic fracture: Secondary | ICD-10-CM | POA: Diagnosis not present

## 2020-06-27 DIAGNOSIS — M79604 Pain in right leg: Secondary | ICD-10-CM | POA: Diagnosis not present

## 2020-06-27 DIAGNOSIS — L89319 Pressure ulcer of right buttock, unspecified stage: Secondary | ICD-10-CM | POA: Diagnosis not present

## 2020-06-27 DIAGNOSIS — L89329 Pressure ulcer of left buttock, unspecified stage: Secondary | ICD-10-CM | POA: Diagnosis not present

## 2020-06-27 DIAGNOSIS — Z9889 Other specified postprocedural states: Secondary | ICD-10-CM | POA: Diagnosis not present

## 2020-06-27 MED ORDER — BENSAL HP 3 % EX OINT: TOPICAL_OINTMENT | CUTANEOUS | 2 refills | Status: DC

## 2020-06-27 NOTE — Progress Notes (Signed)
   Follow-Up Visit   Subjective  [name] is a 86 y.o. male who presents for the following: Skin Problem (Patient here today for sore on his buttocks. ).  Patient fell last day of August and broke his femur. He was in the hospital for 5 days then was in rehab for 2 weeks when he noticed the sore, it is painful.. Patient was using neosporin then saw PA at [facility] last week and was told to use Vaseline which he has been doing. Patient lives alone and is not able to get a bandage on the area. He has been using a donut pillow. Also has a rx for mupirocin but has not filled it yet.   The following portions of the chart were reviewed this encounter and updated as appropriate:  Tobacco  Allergies  Meds  Problems  Med Hx  Surg Hx  Fam Hx      Review of Systems:  No other skin or systemic complaints except as noted in HPI or Assessment and Plan.  Objective  Well appearing patient in no apparent distress; mood and affect are within normal limits.  A focused examination was performed including buttocks. Relevant physical exam findings are noted in the Assessment and Plan.  Objective  bilateral buttocks: 1.5cm each side, eroded with fissures   Assessment & Plan  Pressure injury of skin of bilateral medial buttock - Decubitus ulcers x 2 from recent illness where he was in wheelchair for weeks bilateral buttocks Start Bensal HP   May take weeks to improve.  May need to consider other therapies. Best to walk as much as possible and sit less.  Ordered Medications: Salicylic Acid (BENSAL HP) 3 % OINT  Return in about 5 weeks (around 08/01/2020).  Graciella Belton, RMA, am acting as scribe for Sarina Ser, MD . Documentation: I have reviewed the above documentation for accuracy and completeness, and I agree with the above.  Sarina Ser, MD

## 2020-06-27 NOTE — Patient Instructions (Signed)
Decubitus ulcer  Continue donut pillow, switching positions.  Start Bensal HP one to two times daily to affected areas.

## 2020-06-28 ENCOUNTER — Encounter: Payer: Self-pay | Admitting: Dermatology

## 2020-06-28 ENCOUNTER — Telehealth: Payer: Self-pay

## 2020-06-28 NOTE — Telephone Encounter (Signed)
He can have more samples if we have some. And  He should be using only a VERY THIN COAT OF MEDICATION - USE SPARINGLY

## 2020-06-28 NOTE — Telephone Encounter (Signed)
Patient called regarding Bensal HP cream. He states the tubes are filled with air and he will not have enough cream for the weekend. Pharmacy also told patient they do not carry this medication because of the cost but if they were to order this for him it would cost him $1,000.

## 2020-06-28 NOTE — Telephone Encounter (Signed)
Left samples at front desk. Called patient but no answer nor option for voicemail.

## 2020-06-29 NOTE — Telephone Encounter (Signed)
Spoke with pt and informed him of the samples up front. He will be by the office today to pick them up.

## 2020-07-01 DIAGNOSIS — M79604 Pain in right leg: Secondary | ICD-10-CM | POA: Diagnosis not present

## 2020-07-01 DIAGNOSIS — Z9889 Other specified postprocedural states: Secondary | ICD-10-CM | POA: Diagnosis not present

## 2020-07-01 DIAGNOSIS — Z8781 Personal history of (healed) traumatic fracture: Secondary | ICD-10-CM | POA: Diagnosis not present

## 2020-07-04 DIAGNOSIS — Z8781 Personal history of (healed) traumatic fracture: Secondary | ICD-10-CM | POA: Diagnosis not present

## 2020-07-04 DIAGNOSIS — Z9889 Other specified postprocedural states: Secondary | ICD-10-CM | POA: Diagnosis not present

## 2020-07-04 DIAGNOSIS — M79604 Pain in right leg: Secondary | ICD-10-CM | POA: Diagnosis not present

## 2020-07-05 DIAGNOSIS — Z23 Encounter for immunization: Secondary | ICD-10-CM | POA: Diagnosis not present

## 2020-07-05 DIAGNOSIS — L89312 Pressure ulcer of right buttock, stage 2: Secondary | ICD-10-CM | POA: Diagnosis not present

## 2020-07-05 DIAGNOSIS — L89322 Pressure ulcer of left buttock, stage 2: Secondary | ICD-10-CM | POA: Diagnosis not present

## 2020-07-08 DIAGNOSIS — Z9889 Other specified postprocedural states: Secondary | ICD-10-CM | POA: Diagnosis not present

## 2020-07-08 DIAGNOSIS — M79604 Pain in right leg: Secondary | ICD-10-CM | POA: Diagnosis not present

## 2020-07-08 DIAGNOSIS — Z8781 Personal history of (healed) traumatic fracture: Secondary | ICD-10-CM | POA: Diagnosis not present

## 2020-07-11 DIAGNOSIS — M79604 Pain in right leg: Secondary | ICD-10-CM | POA: Diagnosis not present

## 2020-07-11 DIAGNOSIS — Z8781 Personal history of (healed) traumatic fracture: Secondary | ICD-10-CM | POA: Diagnosis not present

## 2020-07-11 DIAGNOSIS — Z9889 Other specified postprocedural states: Secondary | ICD-10-CM | POA: Diagnosis not present

## 2020-07-13 ENCOUNTER — Telehealth: Payer: Self-pay

## 2020-07-13 NOTE — Telephone Encounter (Signed)
Patient called asking for more Bensal HP samples. We do not have any left. Patient states he has little improvement to the area and can now sleep in his own bed but still very sore. Please advise.

## 2020-07-13 NOTE — Telephone Encounter (Signed)
May send in the [facility] "INTERTRIGO" Cream. Use daily to [facility]. Advise how he will get it with email; mail etc.

## 2020-07-14 NOTE — Telephone Encounter (Signed)
Called patient but no answer and could not leave a voicemail.

## 2020-07-15 DIAGNOSIS — M79604 Pain in right leg: Secondary | ICD-10-CM | POA: Diagnosis not present

## 2020-07-15 DIAGNOSIS — Z9889 Other specified postprocedural states: Secondary | ICD-10-CM | POA: Diagnosis not present

## 2020-07-15 DIAGNOSIS — Z8781 Personal history of (healed) traumatic fracture: Secondary | ICD-10-CM | POA: Diagnosis not present

## 2020-07-18 DIAGNOSIS — Z8781 Personal history of (healed) traumatic fracture: Secondary | ICD-10-CM | POA: Diagnosis not present

## 2020-07-18 DIAGNOSIS — Z9889 Other specified postprocedural states: Secondary | ICD-10-CM | POA: Diagnosis not present

## 2020-07-18 DIAGNOSIS — M79604 Pain in right leg: Secondary | ICD-10-CM | POA: Diagnosis not present

## 2020-07-18 NOTE — Telephone Encounter (Signed)
Spoke with pt and informed him of new prescription. Advised pt on how [facility] works and to keep an eye on his email. Rx sent to [facility].

## 2020-07-22 DIAGNOSIS — Z9889 Other specified postprocedural states: Secondary | ICD-10-CM | POA: Diagnosis not present

## 2020-07-22 DIAGNOSIS — Z8781 Personal history of (healed) traumatic fracture: Secondary | ICD-10-CM | POA: Diagnosis not present

## 2020-07-22 DIAGNOSIS — M79604 Pain in right leg: Secondary | ICD-10-CM | POA: Diagnosis not present

## 2020-07-25 DIAGNOSIS — Z9889 Other specified postprocedural states: Secondary | ICD-10-CM | POA: Diagnosis not present

## 2020-07-25 DIAGNOSIS — M79604 Pain in right leg: Secondary | ICD-10-CM | POA: Diagnosis not present

## 2020-07-25 DIAGNOSIS — I129 Hypertensive chronic kidney disease with stage 1 through stage 4 chronic kidney disease, or unspecified chronic kidney disease: Secondary | ICD-10-CM | POA: Diagnosis not present

## 2020-07-25 DIAGNOSIS — N1832 Chronic kidney disease, stage 3b: Secondary | ICD-10-CM | POA: Diagnosis not present

## 2020-07-25 DIAGNOSIS — D631 Anemia in chronic kidney disease: Secondary | ICD-10-CM | POA: Diagnosis not present

## 2020-07-25 DIAGNOSIS — E872 Acidosis: Secondary | ICD-10-CM | POA: Diagnosis not present

## 2020-07-25 DIAGNOSIS — R809 Proteinuria, unspecified: Secondary | ICD-10-CM | POA: Diagnosis not present

## 2020-07-25 DIAGNOSIS — Z8781 Personal history of (healed) traumatic fracture: Secondary | ICD-10-CM | POA: Diagnosis not present

## 2020-07-25 DIAGNOSIS — E871 Hypo-osmolality and hyponatremia: Secondary | ICD-10-CM | POA: Diagnosis not present

## 2020-08-01 DIAGNOSIS — Z8781 Personal history of (healed) traumatic fracture: Secondary | ICD-10-CM | POA: Diagnosis not present

## 2020-08-01 DIAGNOSIS — Z9889 Other specified postprocedural states: Secondary | ICD-10-CM | POA: Diagnosis not present

## 2020-08-02 DIAGNOSIS — S7291XA Unspecified fracture of right femur, initial encounter for closed fracture: Secondary | ICD-10-CM | POA: Diagnosis not present

## 2020-08-03 DIAGNOSIS — Z8781 Personal history of (healed) traumatic fracture: Secondary | ICD-10-CM | POA: Diagnosis not present

## 2020-08-03 DIAGNOSIS — Z9889 Other specified postprocedural states: Secondary | ICD-10-CM | POA: Diagnosis not present

## 2020-08-03 DIAGNOSIS — M79604 Pain in right leg: Secondary | ICD-10-CM | POA: Diagnosis not present

## 2020-08-04 ENCOUNTER — Ambulatory Visit: Payer: PPO | Admitting: Dermatology

## 2020-08-04 DIAGNOSIS — M25551 Pain in right hip: Secondary | ICD-10-CM | POA: Diagnosis not present

## 2020-08-04 DIAGNOSIS — M25561 Pain in right knee: Secondary | ICD-10-CM | POA: Diagnosis not present

## 2020-08-04 DIAGNOSIS — Z96651 Presence of right artificial knee joint: Secondary | ICD-10-CM | POA: Diagnosis not present

## 2020-08-05 ENCOUNTER — Emergency Department: Payer: PPO

## 2020-08-05 ENCOUNTER — Other Ambulatory Visit: Payer: Self-pay

## 2020-08-05 ENCOUNTER — Inpatient Hospital Stay
Admission: EM | Admit: 2020-08-05 | Discharge: 2020-08-10 | DRG: 480 | Disposition: A | Discharge status: Home / Self Care | Payer: PPO | Attending: Internal Medicine | Admitting: Internal Medicine

## 2020-08-05 ENCOUNTER — Encounter: Payer: Self-pay | Admitting: Emergency Medicine

## 2020-08-05 DIAGNOSIS — Z951 Presence of aortocoronary bypass graft: Secondary | ICD-10-CM

## 2020-08-05 DIAGNOSIS — N183 Chronic kidney disease, stage 3 unspecified: Secondary | ICD-10-CM | POA: Diagnosis not present

## 2020-08-05 DIAGNOSIS — Z953 Presence of xenogenic heart valve: Secondary | ICD-10-CM | POA: Diagnosis not present

## 2020-08-05 DIAGNOSIS — Z20822 Contact with and (suspected) exposure to covid-19: Secondary | ICD-10-CM | POA: Diagnosis present

## 2020-08-05 DIAGNOSIS — R319 Hematuria, unspecified: Secondary | ICD-10-CM | POA: Diagnosis not present

## 2020-08-05 DIAGNOSIS — M9701XD Periprosthetic fracture around internal prosthetic right hip joint, subsequent encounter: Secondary | ICD-10-CM | POA: Diagnosis not present

## 2020-08-05 DIAGNOSIS — D472 Monoclonal gammopathy: Secondary | ICD-10-CM | POA: Diagnosis present

## 2020-08-05 DIAGNOSIS — Z96653 Presence of artificial knee joint, bilateral: Secondary | ICD-10-CM | POA: Diagnosis present

## 2020-08-05 DIAGNOSIS — L89312 Pressure ulcer of right buttock, stage 2: Secondary | ICD-10-CM | POA: Diagnosis present

## 2020-08-05 DIAGNOSIS — J90 Pleural effusion, not elsewhere classified: Secondary | ICD-10-CM

## 2020-08-05 DIAGNOSIS — E785 Hyperlipidemia, unspecified: Secondary | ICD-10-CM | POA: Diagnosis not present

## 2020-08-05 DIAGNOSIS — I25810 Atherosclerosis of coronary artery bypass graft(s) without angina pectoris: Secondary | ICD-10-CM | POA: Diagnosis not present

## 2020-08-05 DIAGNOSIS — I509 Heart failure, unspecified: Secondary | ICD-10-CM

## 2020-08-05 DIAGNOSIS — I5021 Acute systolic (congestive) heart failure: Secondary | ICD-10-CM

## 2020-08-05 DIAGNOSIS — I5043 Acute on chronic combined systolic (congestive) and diastolic (congestive) heart failure: Secondary | ICD-10-CM | POA: Diagnosis not present

## 2020-08-05 DIAGNOSIS — R52 Pain, unspecified: Secondary | ICD-10-CM | POA: Diagnosis not present

## 2020-08-05 DIAGNOSIS — I2510 Atherosclerotic heart disease of native coronary artery without angina pectoris: Secondary | ICD-10-CM | POA: Diagnosis not present

## 2020-08-05 DIAGNOSIS — M25551 Pain in right hip: Secondary | ICD-10-CM | POA: Diagnosis not present

## 2020-08-05 DIAGNOSIS — Z96651 Presence of right artificial knee joint: Secondary | ICD-10-CM

## 2020-08-05 DIAGNOSIS — M1990 Unspecified osteoarthritis, unspecified site: Secondary | ICD-10-CM | POA: Diagnosis not present

## 2020-08-05 DIAGNOSIS — L89322 Pressure ulcer of left buttock, stage 2: Secondary | ICD-10-CM | POA: Diagnosis present

## 2020-08-05 DIAGNOSIS — S72391A Other fracture of shaft of right femur, initial encounter for closed fracture: Secondary | ICD-10-CM | POA: Diagnosis not present

## 2020-08-05 DIAGNOSIS — M978XXA Periprosthetic fracture around other internal prosthetic joint, initial encounter: Secondary | ICD-10-CM

## 2020-08-05 DIAGNOSIS — I248 Other forms of acute ischemic heart disease: Secondary | ICD-10-CM | POA: Diagnosis not present

## 2020-08-05 DIAGNOSIS — M25561 Pain in right knee: Secondary | ICD-10-CM | POA: Diagnosis not present

## 2020-08-05 DIAGNOSIS — N189 Chronic kidney disease, unspecified: Secondary | ICD-10-CM | POA: Diagnosis present

## 2020-08-05 DIAGNOSIS — J9811 Atelectasis: Secondary | ICD-10-CM | POA: Diagnosis not present

## 2020-08-05 DIAGNOSIS — E871 Hypo-osmolality and hyponatremia: Secondary | ICD-10-CM | POA: Diagnosis present

## 2020-08-05 DIAGNOSIS — L8990 Pressure ulcer of unspecified site, unspecified stage: Secondary | ICD-10-CM | POA: Insufficient documentation

## 2020-08-05 DIAGNOSIS — I5023 Acute on chronic systolic (congestive) heart failure: Secondary | ICD-10-CM | POA: Diagnosis not present

## 2020-08-05 DIAGNOSIS — W010XXA Fall on same level from slipping, tripping and stumbling without subsequent striking against object, initial encounter: Secondary | ICD-10-CM | POA: Diagnosis present

## 2020-08-05 DIAGNOSIS — N179 Acute kidney failure, unspecified: Secondary | ICD-10-CM | POA: Diagnosis present

## 2020-08-05 DIAGNOSIS — K219 Gastro-esophageal reflux disease without esophagitis: Secondary | ICD-10-CM | POA: Diagnosis present

## 2020-08-05 DIAGNOSIS — M79604 Pain in right leg: Secondary | ICD-10-CM | POA: Diagnosis not present

## 2020-08-05 DIAGNOSIS — W19XXXA Unspecified fall, initial encounter: Secondary | ICD-10-CM | POA: Diagnosis not present

## 2020-08-05 DIAGNOSIS — I252 Old myocardial infarction: Secondary | ICD-10-CM

## 2020-08-05 DIAGNOSIS — I10 Essential (primary) hypertension: Secondary | ICD-10-CM | POA: Diagnosis present

## 2020-08-05 DIAGNOSIS — S72491A Other fracture of lower end of right femur, initial encounter for closed fracture: Secondary | ICD-10-CM | POA: Diagnosis not present

## 2020-08-05 DIAGNOSIS — I130 Hypertensive heart and chronic kidney disease with heart failure and stage 1 through stage 4 chronic kidney disease, or unspecified chronic kidney disease: Secondary | ICD-10-CM | POA: Diagnosis present

## 2020-08-05 DIAGNOSIS — I255 Ischemic cardiomyopathy: Secondary | ICD-10-CM | POA: Diagnosis not present

## 2020-08-05 DIAGNOSIS — Z79899 Other long term (current) drug therapy: Secondary | ICD-10-CM

## 2020-08-05 DIAGNOSIS — I459 Conduction disorder, unspecified: Secondary | ICD-10-CM | POA: Diagnosis not present

## 2020-08-05 DIAGNOSIS — Z888 Allergy status to other drugs, medicaments and biological substances status: Secondary | ICD-10-CM

## 2020-08-05 DIAGNOSIS — Z96649 Presence of unspecified artificial hip joint: Secondary | ICD-10-CM

## 2020-08-05 DIAGNOSIS — N184 Chronic kidney disease, stage 4 (severe): Secondary | ICD-10-CM | POA: Diagnosis not present

## 2020-08-05 DIAGNOSIS — Z91041 Radiographic dye allergy status: Secondary | ICD-10-CM

## 2020-08-05 DIAGNOSIS — S72001A Fracture of unspecified part of neck of right femur, initial encounter for closed fracture: Secondary | ICD-10-CM | POA: Diagnosis not present

## 2020-08-05 DIAGNOSIS — M9701XA Periprosthetic fracture around internal prosthetic right hip joint, initial encounter: Principal | ICD-10-CM | POA: Diagnosis present

## 2020-08-05 DIAGNOSIS — Z7902 Long term (current) use of antithrombotics/antiplatelets: Secondary | ICD-10-CM

## 2020-08-05 DIAGNOSIS — I429 Cardiomyopathy, unspecified: Secondary | ICD-10-CM | POA: Diagnosis not present

## 2020-08-05 DIAGNOSIS — Z885 Allergy status to narcotic agent status: Secondary | ICD-10-CM

## 2020-08-05 DIAGNOSIS — I517 Cardiomegaly: Secondary | ICD-10-CM | POA: Diagnosis not present

## 2020-08-05 DIAGNOSIS — Z9861 Coronary angioplasty status: Secondary | ICD-10-CM

## 2020-08-05 DIAGNOSIS — J811 Chronic pulmonary edema: Secondary | ICD-10-CM | POA: Diagnosis not present

## 2020-08-05 DIAGNOSIS — S7221XA Displaced subtrochanteric fracture of right femur, initial encounter for closed fracture: Principal | ICD-10-CM | POA: Diagnosis present

## 2020-08-05 DIAGNOSIS — E7800 Pure hypercholesterolemia, unspecified: Secondary | ICD-10-CM | POA: Diagnosis not present

## 2020-08-05 DIAGNOSIS — S7291XA Unspecified fracture of right femur, initial encounter for closed fracture: Secondary | ICD-10-CM

## 2020-08-05 DIAGNOSIS — Z87891 Personal history of nicotine dependence: Secondary | ICD-10-CM

## 2020-08-05 DIAGNOSIS — N1831 Chronic kidney disease, stage 3a: Secondary | ICD-10-CM | POA: Diagnosis present

## 2020-08-05 HISTORY — DX: Periprosthetic fracture around other internal prosthetic joint, initial encounter: M97.8XXA

## 2020-08-05 HISTORY — DX: Presence of unspecified artificial hip joint: Z96.649

## 2020-08-05 LAB — BASIC METABOLIC PANEL
Anion gap: 10 (ref 5–15)
BUN: 37 mg/dL — ABNORMAL HIGH (ref 8–23)
CO2: 21 mmol/L — ABNORMAL LOW (ref 22–32)
Calcium: 8.7 mg/dL — ABNORMAL LOW (ref 8.9–10.3)
Chloride: 97 mmol/L — ABNORMAL LOW (ref 98–111)
Creatinine, Ser: 1.72 mg/dL — ABNORMAL HIGH (ref 0.61–1.24)
GFR, Estimated: 38 mL/min — ABNORMAL LOW (ref >60)
Glucose, Bld: 122 mg/dL — ABNORMAL HIGH (ref 70–99)
Potassium: 4.6 mmol/L (ref 3.5–5.1)
Sodium: 128 mmol/L — ABNORMAL LOW (ref 135–145)

## 2020-08-05 LAB — CBC
HCT: 36.5 % — ABNORMAL LOW (ref 39.0–52.0)
Hemoglobin: 12.6 g/dL — ABNORMAL LOW (ref 13.0–17.0)
MCH: 31.1 pg (ref 26.0–34.0)
MCHC: 34.5 g/dL (ref 30.0–36.0)
MCV: 90.1 fL (ref 80.0–100.0)
Platelets: 242 10*3/uL (ref 150–400)
RBC: 4.05 MIL/uL — ABNORMAL LOW (ref 4.22–5.81)
RDW: 14.6 % (ref 11.5–15.5)
WBC: 6.9 10*3/uL (ref 4.0–10.5)
nRBC: 0 % (ref 0.0–0.2)

## 2020-08-05 MED ORDER — POLYETHYLENE GLYCOL 3350 17 G PO PACK: 17.0000 g | PACK | Freq: Every day | ORAL | Status: DC | PRN

## 2020-08-05 MED ORDER — ACETAMINOPHEN 500 MG PO TABS
1000.0000 mg | ORAL_TABLET | Freq: Once | ORAL | Status: AC
  Administered 2020-08-05: 1000 mg via ORAL
  Filled 2020-08-05: qty 2

## 2020-08-05 MED ORDER — ONDANSETRON HCL 4 MG/2ML IJ SOLN: 4.0000 mg | Freq: Four times a day (QID) | INTRAMUSCULAR | Status: DC | PRN

## 2020-08-05 MED ORDER — PRAVASTATIN SODIUM 20 MG PO TABS
20.0000 mg | ORAL_TABLET | Freq: Every evening | ORAL | Status: DC
  Administered 2020-08-06 – 2020-08-09 (×4): 20 mg via ORAL
  Filled 2020-08-05 (×6): qty 1

## 2020-08-05 MED ORDER — SODIUM CHLORIDE 0.9% FLUSH
3.0000 mL | Freq: Two times a day (BID) | INTRAVENOUS | Status: DC
  Administered 2020-08-06 – 2020-08-10 (×10): 3 mL via INTRAVENOUS

## 2020-08-05 MED ORDER — ACETAMINOPHEN 325 MG PO TABS
650.0000 mg | ORAL_TABLET | ORAL | Status: DC | PRN
  Administered 2020-08-06 (×2): 650 mg via ORAL
  Filled 2020-08-05 (×2): qty 2

## 2020-08-05 MED ORDER — SODIUM CHLORIDE 0.9 % IV SOLN: 250.0000 mL | INTRAVENOUS | Status: DC | PRN

## 2020-08-05 MED ORDER — TRAMADOL HCL 50 MG PO TABS
100.0000 mg | ORAL_TABLET | Freq: Four times a day (QID) | ORAL | Status: DC | PRN
  Administered 2020-08-06 – 2020-08-09 (×7): 100 mg via ORAL
  Filled 2020-08-05 (×7): qty 2

## 2020-08-05 MED ORDER — DOCUSATE SODIUM 100 MG PO CAPS
100.0000 mg | ORAL_CAPSULE | Freq: Two times a day (BID) | ORAL | Status: DC
  Administered 2020-08-06 – 2020-08-07 (×4): 100 mg via ORAL
  Filled 2020-08-05 (×5): qty 1

## 2020-08-05 MED ORDER — SODIUM CHLORIDE 0.9% FLUSH: 3.0000 mL | INTRAVENOUS | Status: DC | PRN

## 2020-08-05 MED ORDER — SODIUM BICARBONATE 650 MG PO TABS
1300.0000 mg | ORAL_TABLET | Freq: Two times a day (BID) | ORAL | Status: DC
  Administered 2020-08-06 – 2020-08-10 (×9): 1300 mg via ORAL
  Filled 2020-08-05 (×11): qty 2

## 2020-08-05 MED ORDER — TRAMADOL HCL 50 MG PO TABS
100.0000 mg | ORAL_TABLET | Freq: Once | ORAL | Status: AC
  Administered 2020-08-05: 100 mg via ORAL
  Filled 2020-08-05: qty 2

## 2020-08-05 MED ORDER — MELATONIN 3 MG PO TABS
1.5000 mg | ORAL_TABLET | Freq: Every evening | ORAL | Status: DC | PRN
  Filled 2020-08-05: qty 0.5

## 2020-08-05 MED ORDER — AMLODIPINE BESYLATE 5 MG PO TABS
5.0000 mg | ORAL_TABLET | Freq: Every day | ORAL | Status: DC
  Administered 2020-08-06 – 2020-08-10 (×5): 5 mg via ORAL
  Filled 2020-08-05 (×5): qty 1

## 2020-08-05 MED ORDER — FUROSEMIDE 10 MG/ML IJ SOLN
40.0000 mg | Freq: Two times a day (BID) | INTRAMUSCULAR | Status: DC
  Administered 2020-08-06 (×3): 40 mg via INTRAVENOUS
  Filled 2020-08-05 (×3): qty 4

## 2020-08-05 MED ORDER — IRBESARTAN 150 MG PO TABS
150.0000 mg | ORAL_TABLET | Freq: Two times a day (BID) | ORAL | Status: DC
  Administered 2020-08-06 – 2020-08-10 (×9): 150 mg via ORAL
  Filled 2020-08-05 (×10): qty 1

## 2020-08-05 NOTE — ED Notes (Signed)
[facility] PA at bedside speaking with pt and family mbr

## 2020-08-05 NOTE — ED Triage Notes (Signed)
First RN Note: pt to ED via [facility] with c/o R leg pain, per EMS pt with recent rod placement to R femur, had recent R leg x-ray that was abnormal, per EMS unknown what was abnormal on leg x-ray.   161/92 98HR 96% RA 97.4 oral

## 2020-08-05 NOTE — ED Provider Notes (Signed)
[facility]ERGENCY DEPARTMENT Provider Note   CSN: [id number] Arrival date & time: 08/05/20  1605     History No chief complaint on file.   [name] is a 86 y.o. male presents to the emergency department for evaluation of right knee pain.  Has a history of right total knee arthroplasty performed several years ago as well as a IM nailing for right hip fracture performed August 2021.  Patient recently released by Dr. Harlow Mares who performed the hip surgery back in August.  Was doing well, 2 days ago had a fall in which he tripped, landed on his right side, points to his lateral hip but is uncertain if he landed on the knee. Patient complains of severe pain in the medial aspect of the right knee. He is able straight leg raise. He states the pain feels like it did when he broke his hip. When he broke his hip back in August he did not have hip pain but only knee pain. He is unable to walk. He is having a hard time getting around. He is very limited in what medications he can take for pain, has been taken tramadol with no improvement. Pain is severe. Patient denies hitting her head or losing consciousness with the fall.  HPI     Past Medical History:  Diagnosis Date  . Anemia   . Aortic aneurysm ([place])   . Aortic aneurysm ([place])   . Arthritis   . CKD (chronic kidney disease)    ALSO LEFT RENAL MASS  . Coronary artery disease   . Diverticulosis    WITH RUPTURE  . GERD (gastroesophageal reflux disease)   . History of hiatal hernia   . Hypercholesteremia   . Hypertension   . MGUS (monoclonal gammopathy of unknown significance)   . MGUS (monoclonal gammopathy of unknown significance)     Patient Active Problem List   Diagnosis Date Noted  . Constipation 05/15/2020  . AKI (acute kidney injury) ([place]) 05/15/2020  . Hip fracture ([place]) 05/11/2020  . MGUS (monoclonal gammopathy of unknown significance) 05/23/2016  . S/P total knee arthroplasty 04/02/2016  .  Chronic kidney disease 11/22/2015  . CAD in native artery 11/22/2015  . CKD (chronic kidney disease) stage 3, GFR 30-59 ml/min (HCC) 06/30/2015  . TI (tricuspid incompetence) 05/11/2015  . Early satiety 01/25/2015  . Benign essential HTN 01/03/2015  . Abdominal aortic aneurysm (AAA) without rupture ([place]) 07/09/2014  . MI (mitral incompetence) 07/09/2014  . Aortic heart valve narrowing 07/09/2014  . Cardiomyopathy, ischemic 06/28/2014  . Arthritis of knee, degenerative 05/17/2014  . Kidney lump 04/09/2013  . Benign prostatic hyperplasia with urinary obstruction 03/17/2013  . Chronic kidney disease, stage IV (severe) ([place]) 03/17/2013  . 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 AORTIC VALVE REPLACEMENT    . bipass    . CARDIAC VALVE REPLACEMENT     pig valve  . CHOLECYSTECTOMY    . COLON SURGERY    . CORONARY ANGIOPLASTY    . CORONARY ARTERY BYPASS GRAFT    . ESOPHAGOGASTRODUODENOSCOPY (EGD) WITH PROPOFOL N/A 02/07/2015   Procedure: ESOPHAGOGASTRODUODENOSCOPY (EGD) WITH PROPOFOL;  Surgeon: Hulen Luster, MD;  Location: [facility] ENDOSCOPY;  Service: Gastroenterology;  Laterality: N/A;  . EYE SURGERY    . HERNIA REPAIR    . INTRAMEDULLARY (IM) NAIL INTERTROCHANTERIC Right 05/12/2020   Procedure: INTRAMEDULLARY (IM) NAIL INTERTROCHANTRIC;  Surgeon: Lovell Sheehan, MD;  Location: [facility] ORS;  Service: Orthopedics;  Laterality: Right;  . JOINT REPLACEMENT     left knee x4  . KNEE ARTHROPLASTY Right 04/02/2016   Procedure: COMPUTER ASSISTED TOTAL KNEE ARTHROPLASTY;  Surgeon: Dereck Leep, MD;  Location: [facility] ORS;  Service: Orthopedics;  Laterality: Right;       Family History  Problem Relation Age of Onset  . Bladder Cancer Neg Hx   . Prostate cancer Neg Hx   . Kidney cancer Neg Hx     Social History   Tobacco Use  . Smoking status: Former Research scientist (life sciences)  . Smokeless tobacco: Never Used  Substance Use Topics  .  Alcohol use: No  . Drug use: No    Home Medications Prior to Admission medications   Medication Sig Start Date End Date Taking? Authorizing Provider  amLODipine (NORVASC) 5 MG tablet Take 5 mg by mouth daily.    [provider]  docusate sodium (COLACE) 100 MG capsule Take 1 capsule (100 mg total) by mouth 2 (two) times daily. 05/16/20   Amin, Ankit Chirag, MD  enoxaparin (LOVENOX) 40 MG/0.4ML injection Inject 0.4 mLs (40 mg total) into the skin daily for 14 days. 05/17/20 05/31/20  Amin, Jeanella Flattery, MD  irbesartan (AVAPRO) 150 MG tablet Take 1 tablet by mouth 2 (two) times daily. 06/08/19   [provider]  Melatonin 3 MG TABS Take 1.5 mg by mouth at bedtime as needed (sleep).    [provider]  polyethylene glycol (MIRALAX / GLYCOLAX) 17 g packet Take 17 g by mouth daily as needed for moderate constipation or severe constipation. 05/16/20   Amin, Jeanella Flattery, MD  pravastatin (PRAVACHOL) 20 MG tablet Take 20 mg by mouth every evening.    [provider]  Salicylic Acid (BENSAL HP) 3 % OINT Apply to affected areas at buttocks 1-2 times daily 06/27/20   Ralene Bathe, MD  sodium bicarbonate 650 MG tablet Take 1,300 mg by mouth 2 (two) times daily.    [provider]  traMADol-acetaminophen (ULTRACET) 37.5-325 MG tablet Take 1-2 tablets by mouth every 6 (six) hours as needed for severe pain. 05/16/20   Amin, Jeanella Flattery, MD    Allergies    Morphine, Oxycodone, Carvedilol, and Iodinated diagnostic agents  Review of Systems   Review of Systems  Constitutional: Negative for chills and fever.  HENT: Negative for congestion.   Respiratory: Positive for shortness of breath (. Mild shortness of breath intermittently since August, no chest pain). Negative for cough, chest tightness and wheezing.   Cardiovascular: Negative for chest pain and leg swelling.  Gastrointestinal: Negative for abdominal pain, diarrhea, nausea, rectal pain and vomiting.    Musculoskeletal: Positive for arthralgias and gait problem.  Skin: Negative for rash and wound.  Neurological: Negative for dizziness and headaches.    Physical Exam Updated Vital Signs BP 126/78 (BP Location: Right Arm)   Pulse 84   Temp 98.3 F (36.8 C)   Resp 18   Ht 5' 11.5" (1.816 m)   Wt 81.6 kg   SpO2 90%   BMI 24.76 kg/m   Physical Exam Constitutional:      Appearance: Normal appearance. He is well-developed.  HENT:     Head: Normocephalic and atraumatic.  Eyes:     Conjunctiva/sclera: Conjunctivae normal.  Cardiovascular:     Rate and Rhythm: Normal rate.  Pulmonary:     Effort: Pulmonary effort is normal. No respiratory distress.     Breath sounds: No wheezing or  rales.  Abdominal:     General: There is no distension.     Tenderness: There is no abdominal tenderness. There is no guarding.  Musculoskeletal:     Cervical back: Normal range of motion.     Comments: Examination of the right lower extremity shows patient is nontender along the greater troches region. He has some pain with hip internal rotation with mild stiffness and guarding. He is nontender along the femoral shaft but does have point tenderness on the medial femoral condyle. Knee is stable with no valgus or varus laxity. He is able to perform active straight leg raise with no palpable defect in the quad tendon or patellar tendon. Has mild edema both lower extremities with negative Homans' sign bilaterally. Nontender throughout the ankles or tib-fib region.  Skin:    General: Skin is warm.     Findings: No rash.  Neurological:     Mental Status: He is alert and oriented to person, place, and time.  Psychiatric:        Behavior: Behavior normal.        Thought Content: Thought content normal.     ED Results / Procedures / Treatments   Labs (all labs ordered are listed, but only abnormal results are displayed) Labs Reviewed  CBC - Abnormal; Notable for the following components:      Result  Value   RBC 4.05 (*)    Hemoglobin 12.6 (*)    HCT 36.5 (*)    All other components within normal limits  BASIC METABOLIC PANEL - Abnormal; Notable for the following components:   Sodium 128 (*)    Chloride 97 (*)    CO2 21 (*)    Glucose, Bld 122 (*)    BUN 37 (*)    Creatinine, Ser 1.72 (*)    Calcium 8.7 (*)    GFR, Estimated 38 (*)    All other components within normal limits  RESP PANEL BY RT-PCR (FLU A&B, COVID) [id number]    EKG None  Radiology DG Chest Portable 1 View  Result Date: 08/05/2020 CLINICAL DATA:  Hip fracture.  Admission. EXAM: PORTABLE CHEST 1 VIEW COMPARISON:  May 11, 2020 FINDINGS: There is a large layering right pleural effusion with underlying atelectasis. There is a small left pleural effusion. Suspected mild edema. Stable cardiomegaly. The hila and mediastinum are unchanged. No pneumothorax. No other acute abnormalities. IMPRESSION: Cardiomegaly, right greater than left pleural effusions as above, and pulmonary edema. Electronically Signed   By: Dorise Bullion III M.D   On: 08/05/2020 20:52   DG Knee Complete 4 Views Right  Result Date: 08/05/2020 CLINICAL DATA:  Right hip pain.  Recent fall. EXAM: RIGHT KNEE - COMPLETE 4+ VIEW COMPARISON:  None. FINDINGS: The patient is status post right knee replacement. Visualized hardware is in good position. No joint effusion. No fracture identified. Vascular calcifications are noted. IMPRESSION: Right knee replacement. Hardware is in good position. No identified fracture. Electronically Signed   By: Dorise Bullion III M.D   On: 08/05/2020 20:51   DG Hip Unilat W or Wo Pelvis 2-3 Views Right  Result Date: 08/05/2020 CLINICAL DATA:  Right hip pain. EXAM: DG HIP (WITH OR WITHOUT PELVIS) 2-3V RIGHT COMPARISON:  05/11/2020 FINDINGS: Hip screw and intramedullary nail in place across a right femoral intertrochanteric fracture. There is a lucency now extending down into the proximal femoral shaft concerning for acute  fracture. No subluxation or dislocation. IMPRESSION: Evidence of remote fracture and internal fixation in the  right intertrochanteric region. New lucency noted extending into the proximal femoral shaft adjacent to the intramedullary nail compatible with acute fracture. Electronically Signed   By: Rolm Baptise M.D.   On: 08/05/2020 20:56    Procedures Procedures (including critical care time)  Medications Ordered in ED Medications  traMADol (ULTRAM) tablet 100 mg (100 mg Oral Given 08/05/20 2007)  acetaminophen (TYLENOL) tablet 1,000 mg (1,000 mg Oral Given 08/05/20 2007)    ED Course  I have reviewed the triage vital signs and the nursing notes.  Pertinent labs & imaging results that were available during my care of the patient were reviewed by me and considered in my medical decision making (see chart for details).    MDM Rules/Calculators/A&P                          86 year old male with a fall 2 days ago has a history of right hip IM nailing performed back in August 2021 as well as a right total knee arthroplasty performed several years ago. He has had a hard time ambulating after his fall couple days ago. He complains of severe knee pain. Patient's pain now resembles the pain he had back in August with his hip fracture. He is taken tramadol with no improvement of his symptoms. He is unable to take stronger narcotics. X-rays of his right hip show questionable proximal femur periprosthetic fracture. No evidence of acute fracture around the knee. Discussed case with orthopedics who recommended CT of the femur. Based on evidence of acute proximal femur fracture, severe pain and inability to ambulate as well as pleural effusions seen on chest x-ray will admit to hospitalist service. Final Clinical Impression(s) / ED Diagnoses Final diagnoses:  Periprosthetic fracture around internal prosthetic right hip joint, initial encounter ([place])  Pleural effusion  Acute pain of right knee  History of  total right knee replacement    Rx / DC Orders ED Discharge Orders    None       Renata Caprice 08/05/20 2155    Nance Pear, MD 08/05/20 2157

## 2020-08-05 NOTE — ED Notes (Signed)
Pt's daughter left exam room

## 2020-08-05 NOTE — H&P (Signed)
History and Physical    [name] IRW:[id number] DOB: 05-04-1931 DOA: 08/05/2020  PCP: Baxter Hire, MD   Patient coming from: Home  I have personally briefly reviewed patient's old medical records in [name]  Chief Complaint: Fall  HPI: [name] is a 86 y.o. male with medical history significant for stage III CKD, hypertension, dyslipidemia, anemia who presents to the ER for evaluation of pain in his right knee. Patient is s/p IM nailing for right hip fracture in August, 2021. He fell 2 days prior to this admission and presents with complaints of severe pain in the medial aspect of his right knee. He denies feeling dizzy or light headed prior to the fall and denies having any LOC.  Fall appears to be mechanical as patient states that his feet current ongoing.  He states that the pain feels similar to when he broke his hip. Patient has difficulty with ambulation and lives alone. He rates his pain a 7/10 in intensity at its worst and describes the pain as severe and constant with no relief from Tramadol.  He complains of shortness of breath and has bilateral lower extremity swelling but denies having any chest pain, no nausea, no vomiting, no diaphoresis or palpitations.  No abdominal pain or any changes in his bowel habits. Labs show sodium 128, potassium 4.6, chloride 97, bicarb 21, glucose 122, BUN 37, creatinine 1.7, calcium 8.7, white count 6.9, hemoglobin 12.6, hematocrit 36.5, MCV 90.1, RDW 34.5, platelet count 242 Right hip x ray shows evidence of remote fracture and internal fixation in the right intertrochanteric region. New lucency noted extending into the proximal femoral shaft adjacent to the intramedullary nail compatible with acute fracture. CXR reviewed by me shows cardiomegaly, right greater than left pleural effusions as above and pulmonary edema. Right knee x ray shows right knee replacement. Hardware is in good position. No  identified fracture.     ED Course: Patient is an 86 year old male who presents to the ER following a fall and is noted to have a right periprosthetic fracture of the femur. He also has acute CHF exacerbation and will be admitted to the hospital for further evaluation.  Review of Systems: As per HPI otherwise 10 point review of systems negative.    Past Medical History:  Diagnosis Date   Anemia    Aortic aneurysm (HCC)    Aortic aneurysm (HCC)    Arthritis    CKD (chronic kidney disease)    ALSO LEFT RENAL MASS   Coronary artery disease    Diverticulosis    WITH RUPTURE   GERD (gastroesophageal reflux disease)    History of hiatal hernia    Hypercholesteremia    Hypertension    MGUS (monoclonal gammopathy of unknown significance)    MGUS (monoclonal gammopathy of unknown significance)     Past Surgical History:  Procedure Laterality Date   AORTIC VALVE REPLACEMENT     bipass     CARDIAC VALVE REPLACEMENT     pig valve   CHOLECYSTECTOMY     COLON SURGERY     CORONARY ANGIOPLASTY     CORONARY ARTERY BYPASS GRAFT     ESOPHAGOGASTRODUODENOSCOPY (EGD) WITH PROPOFOL N/A 02/07/2015   Procedure: ESOPHAGOGASTRODUODENOSCOPY (EGD) WITH PROPOFOL;  Surgeon: Hulen Luster, MD;  Location: [facility];  Service: Gastroenterology;  Laterality: N/A;   EYE SURGERY     HERNIA REPAIR     INTRAMEDULLARY (IM) NAIL INTERTROCHANTERIC Right 05/12/2020   Procedure: INTRAMEDULLARY (  IM) NAIL INTERTROCHANTRIC;  Surgeon: Lovell Sheehan, MD;  Location: [facility] ORS;  Service: Orthopedics;  Laterality: Right;   JOINT REPLACEMENT     left knee x4   KNEE ARTHROPLASTY Right 04/02/2016   Procedure: COMPUTER ASSISTED TOTAL KNEE ARTHROPLASTY;  Surgeon: Dereck Leep, MD;  Location: [facility] ORS;  Service: Orthopedics;  Laterality: Right;     reports that he has quit smoking. He has never used smokeless tobacco. He reports that he does not drink alcohol and does not use  drugs.  Allergies  Allergen Reactions   Morphine Anaphylaxis   Oxycodone Other (See Comments)    Hypotension and bradycardia   Carvedilol     Dizziness and syncope    Iodinated Diagnostic Agents Rash    Other reaction(s): Asthenia (finding), Other (qualifier value) paralyzed legs Other reaction(s): RASH    Lisinopril Cough and Other (See Comments)    Family History  Problem Relation Age of Onset   Bladder Cancer Neg Hx    Prostate cancer Neg Hx    Kidney cancer Neg Hx      Prior to Admission medications   Medication Sig Start Date End Date Taking? Authorizing Provider  amLODipine (NORVASC) 5 MG tablet Take 5 mg by mouth daily.    [provider]  docusate sodium (COLACE) 100 MG capsule Take 1 capsule (100 mg total) by mouth 2 (two) times daily. 05/16/20   Amin, Ankit Chirag, MD  enoxaparin (LOVENOX) 40 MG/0.4ML injection Inject 0.4 mLs (40 mg total) into the skin daily for 14 days. 05/17/20 05/31/20  Amin, Jeanella Flattery, MD  irbesartan (AVAPRO) 150 MG tablet Take 1 tablet by mouth 2 (two) times daily. 06/08/19   [provider]  Melatonin 3 MG TABS Take 1.5 mg by mouth at bedtime as needed (sleep).    [provider]  polyethylene glycol (MIRALAX / GLYCOLAX) 17 g packet Take 17 g by mouth daily as needed for moderate constipation or severe constipation. 05/16/20   Amin, Jeanella Flattery, MD  pravastatin (PRAVACHOL) 20 MG tablet Take 20 mg by mouth every evening.    [provider]  Salicylic Acid (BENSAL HP) 3 % OINT Apply to affected areas at buttocks 1-2 times daily 06/27/20   Ralene Bathe, MD  sodium bicarbonate 650 MG tablet Take 1,300 mg by mouth 2 (two) times daily.    [provider]  traMADol-acetaminophen (ULTRACET) 37.5-325 MG tablet Take 1-2 tablets by mouth every 6 (six) hours as needed for severe pain. 05/16/20   Damita Lack, MD    Physical Exam: Vitals:   08/05/20 1705 08/05/20 1706 08/05/20 2101  BP: 131/75   126/78  Pulse: 91  84  Resp: 18  18  Temp: 98.3 F (36.8 C)    SpO2: 94%  90%  Weight:  81.6 kg   Height:  5' 11.5" (1.816 m)      Vitals:   08/05/20 1705 08/05/20 1706 08/05/20 2101  BP: 131/75  126/78  Pulse: 91  84  Resp: 18  18  Temp: 98.3 F (36.8 C)    SpO2: 94%  90%  Weight:  81.6 kg   Height:  5' 11.5" (1.816 m)     Constitutional: NAD, alert and oriented x 3.  Appears uncomfortable and in pain Eyes: PERRL, lids and conjunctivae normal ENMT: Mucous membranes are moist.  Neck: normal, supple, no masses, no thyromegaly Respiratory: Bilateral air entry, no wheezing, crackles at the bases. Normal respiratory effort. No accessory muscle use.  Cardiovascular: Regular rate and rhythm, no murmurs / rubs / gallops. 2+ extremity edema. 2+ pedal pulses. No carotid bruits.  Abdomen: no tenderness, no masses palpated. No hepatosplenomegaly. Bowel sounds positive.  Musculoskeletal: no clubbing / cyanosis. No joint deformity upper and lower extremities.  Decreased range of motion right hip Skin: no rashes, lesions, ulcers.  Neurologic: No gross focal neurologic deficit. Psychiatric: Normal mood and affect.   Labs on Admission: I have personally reviewed following labs and imaging studies  CBC: Recent Labs  Lab 08/05/20 2018  WBC 6.9  HGB 12.6*  HCT 36.5*  MCV 90.1  PLT 628   Basic Metabolic Panel: Recent Labs  Lab 08/05/20 2018  NA 128*  K 4.6  CL 97*  CO2 21*  GLUCOSE 122*  BUN 37*  CREATININE 1.72*  CALCIUM 8.7*   GFR: Estimated Creatinine Clearance: 32.7 mL/min (A) (by C-G formula based on SCr of 1.72 mg/dL (H)). Liver Function Tests: No results for input(s): AST, ALT, ALKPHOS, BILITOT, PROT, ALBUMIN in the last 168 hours. No results for input(s): LIPASE, AMYLASE in the last 168 hours. No results for input(s): AMMONIA in the last 168 hours. Coagulation Profile: No results for input(s): INR, PROTIME in the last 168 hours. Cardiac Enzymes: No results  for input(s): CKTOTAL, CKMB, CKMBINDEX, TROPONINI in the last 168 hours. BNP (last 3 results) No results for input(s): PROBNP in the last 8760 hours. HbA1C: No results for input(s): HGBA1C in the last 72 hours. CBG: No results for input(s): GLUCAP in the last 168 hours. Lipid Profile: No results for input(s): CHOL, HDL, LDLCALC, TRIG, CHOLHDL, LDLDIRECT in the last 72 hours. Thyroid Function Tests: No results for input(s): TSH, T4TOTAL, FREET4, T3FREE, THYROIDAB in the last 72 hours. Anemia Panel: No results for input(s): VITAMINB12, FOLATE, FERRITIN, TIBC, IRON, RETICCTPCT in the last 72 hours. Urine analysis:    Component Value Date/Time   COLORURINE STRAW (A) 03/21/2016 1507   APPEARANCEUR CLEAR (A) 03/21/2016 1507   APPEARANCEUR Clear 11/22/2015 0950   LABSPEC 1.005 03/21/2016 1507   PHURINE 7.0 03/21/2016 1507   GLUCOSEU NEGATIVE 03/21/2016 1507   HGBUR NEGATIVE 03/21/2016 1507   BILIRUBINUR NEGATIVE 03/21/2016 1507   BILIRUBINUR Negative 11/22/2015 0950   KETONESUR NEGATIVE 03/21/2016 1507   PROTEINUR NEGATIVE 03/21/2016 1507   UROBILINOGEN 1.0 02/16/2010 1336   NITRITE NEGATIVE 03/21/2016 1507   LEUKOCYTESUR NEGATIVE 03/21/2016 1507   LEUKOCYTESUR Negative 11/22/2015 0950    Radiological Exams on Admission: CT FEMUR RIGHT WO CONTRAST  Result Date: 08/05/2020 CLINICAL DATA:  History of remote fracture with internal fixation. Fall today, pain. EXAM: CT OF THE LOWER RIGHT EXTREMITY WITHOUT CONTRAST TECHNIQUE: Multidetector CT imaging of the right lower extremity was performed according to the standard protocol. COMPARISON:  Plain films today FINDINGS: Changes of remote right femoral intertrochanteric fracture with internal fixation with hip screw and intramedullary nail. Fracture lines remain evident throughout the intertrochanteric region. This could reflect nonunion although cannot exclude re-injury/re-fracture. There is a nondisplaced acute fracture seen in the proximal  shaft along the intramedullary nail. No subluxation or dislocation. IMPRESSION: Extensive comminuted old right intertrochanteric fracture with internal fixation. Fracture lines remain evident and may be related to nonunion although re-fracture cannot be excluded. Acute nondisplaced fracture through the proximal femoral shaft adjacent to the intramedullary nail. Electronically Signed   By: Rolm Baptise M.D.   On: 08/05/2020 22:28   DG Chest Portable 1 View  Result Date: 08/05/2020 CLINICAL DATA:  Hip fracture.  Admission. EXAM: PORTABLE CHEST  1 VIEW COMPARISON:  May 11, 2020 FINDINGS: There is a large layering right pleural effusion with underlying atelectasis. There is a small left pleural effusion. Suspected mild edema. Stable cardiomegaly. The hila and mediastinum are unchanged. No pneumothorax. No other acute abnormalities. IMPRESSION: Cardiomegaly, right greater than left pleural effusions as above, and pulmonary edema. Electronically Signed   By: Dorise Bullion III M.D   On: 08/05/2020 20:52   DG Knee Complete 4 Views Right  Result Date: 08/05/2020 CLINICAL DATA:  Right hip pain.  Recent fall. EXAM: RIGHT KNEE - COMPLETE 4+ VIEW COMPARISON:  None. FINDINGS: The patient is status post right knee replacement. Visualized hardware is in good position. No joint effusion. No fracture identified. Vascular calcifications are noted. IMPRESSION: Right knee replacement. Hardware is in good position. No identified fracture. Electronically Signed   By: Dorise Bullion III M.D   On: 08/05/2020 20:51   DG Hip Unilat W or Wo Pelvis 2-3 Views Right  Result Date: 08/05/2020 CLINICAL DATA:  Right hip pain. EXAM: DG HIP (WITH OR WITHOUT PELVIS) 2-3V RIGHT COMPARISON:  05/11/2020 FINDINGS: Hip screw and intramedullary nail in place across a right femoral intertrochanteric fracture. There is a lucency now extending down into the proximal femoral shaft concerning for acute fracture. No subluxation or dislocation.  IMPRESSION: Evidence of remote fracture and internal fixation in the right intertrochanteric region. New lucency noted extending into the proximal femoral shaft adjacent to the intramedullary nail compatible with acute fracture. Electronically Signed   By: Rolm Baptise M.D.   On: 08/05/2020 20:56    EKG: Independently reviewed.   Assessment/Plan Principal Problem:   Acute CHF (congestive heart failure) (HCC) Active Problems:   Benign essential HTN   Cardiomyopathy, ischemic   Chronic kidney disease   Periprosthetic fracture around internal prosthetic hip joint   Hyponatremia     Acute on chronic systolic CHF Patient with complaints of shortness of breath and noted to have bilateral lower extremity swelling Chest x-ray shows bilateral pleural effusions BNP is pending Placement to the echocardiogram from August, 2021 shows an LVEF of 40 to 45% Start patient on furosemide 40 mg IV every 12 Continue Avapro Patient not on beta-blockers due to acute decompensation Request cardiology consult    Hyponatremia Most likely secondary to volume overload from CHF Expect improvement in sodium levels with diuresis    Hypertension Continue Avapro and amlodipine   Stage III chronic kidney disease Monitor closely during this hospitalization     Right periprosthetic fracture of the femur Patient is status post fall Immobilize right lower extremity Pain control Consult orthopedic surgery     DVT prophylaxis: SCD Code Status: Full code Family Communication: Greater than 50% of time was spent discussing plan of care with patient at the bedside. All questions and concerns have been addressed.  He verbalizes understanding and agrees with the plan. Disposition Plan: Back to previous home environment Consults called: Orthopedic surgery/cardiology    Ambri Miltner MD [facility]     08/05/2020, 10:33 PM

## 2020-08-05 NOTE — ED Triage Notes (Signed)
Pt had fall in August and had femur fracture with surgical repair.  Pt was cleared by ortho day before yesterday.  Pt fell again yesterday and now is having severe pain to right knee. Pt was seen at knee doctor yesterday and had xrays and was told nothing was showing on xrays.  Called MDs again today and was told to come back to office or to ER.  Pt was unable to stand to get into a car and came by EMS here.

## 2020-08-05 NOTE — ED Notes (Signed)
Pt reports he had made substantial recovery since his fall in august resulting in right femur fracture. Cleared by ortho day before yesterday. Fell yesterday with greatly increased right knee pain. Pain has held steady since the fall, denies swelling.   Xrays yesterday were clear, pt able to somewhat ambulate. Today pt is having significant difficulty ambulating and not bearing weight well.   Pt also reports difficulty voiding new today. Denies hx of prostate problems, or medications like flomax. Does report urge, reports usually he can pee when standing but has not been able to pee since 1430 today

## 2020-08-06 DIAGNOSIS — I5043 Acute on chronic combined systolic (congestive) and diastolic (congestive) heart failure: Secondary | ICD-10-CM

## 2020-08-06 DIAGNOSIS — M9701XA Periprosthetic fracture around internal prosthetic right hip joint, initial encounter: Secondary | ICD-10-CM

## 2020-08-06 LAB — TROPONIN I (HIGH SENSITIVITY)
Troponin I (High Sensitivity): 62 ng/L — ABNORMAL HIGH (ref <18)
Troponin I (High Sensitivity): 68 ng/L — ABNORMAL HIGH (ref <18)

## 2020-08-06 LAB — BASIC METABOLIC PANEL
Anion gap: 11 (ref 5–15)
BUN: 37 mg/dL — ABNORMAL HIGH (ref 8–23)
CO2: 19 mmol/L — ABNORMAL LOW (ref 22–32)
Calcium: 8.7 mg/dL — ABNORMAL LOW (ref 8.9–10.3)
Chloride: 98 mmol/L (ref 98–111)
Creatinine, Ser: 1.68 mg/dL — ABNORMAL HIGH (ref 0.61–1.24)
GFR, Estimated: 39 mL/min — ABNORMAL LOW (ref >60)
Glucose, Bld: 116 mg/dL — ABNORMAL HIGH (ref 70–99)
Potassium: 4.8 mmol/L (ref 3.5–5.1)
Sodium: 128 mmol/L — ABNORMAL LOW (ref 135–145)

## 2020-08-06 LAB — RESP PANEL BY RT-PCR (FLU A&B, COVID) ARPGX2
Influenza A by PCR: NEGATIVE
Influenza B by PCR: NEGATIVE
SARS Coronavirus 2 by RT PCR: NEGATIVE

## 2020-08-06 LAB — BRAIN NATRIURETIC PEPTIDE: B Natriuretic Peptide: 2713.8 pg/mL — ABNORMAL HIGH (ref 0.0–100.0)

## 2020-08-06 NOTE — ED Provider Notes (Signed)
ED ECG REPORT I, Hinda Kehr, the attending physician, personally viewed and interpreted this ECG.  Date: 08/06/2020 EKG Time: 00:33 Rate: 87 Rhythm: normal sinus rhythm QRS Axis: LAD Intervals: Nonspecific interventricular block ST/T Wave abnormalities: Non-specific ST segment / T-wave changes, but no clear evidence of acute ischemia. Narrative Interpretation: no definitive evidence of acute ischemia; does not meet STEMI criteria.     Hinda Kehr, MD 08/06/20 [phone]

## 2020-08-06 NOTE — Consult Note (Signed)
[facility] Consultation Note  Patient ID: [name], MRN: [id number], DOB/AGE: 02/22/1931 86 y.o. Admit date: 08/05/2020   Date of Consult: 08/06/2020 Primary Physician: Baxter Hire, MD Primary Cardiologist: Nehemiah Massed  Chief Complaint: No chief complaint on file.  Reason for Consult: Cardiovascular disease with heart failure  HPI: 86 y.o. male with known coronary artery disease status post previous coronary artery bypass graft and inferior myocardial infarction with aortic valve replacement and in the remote past.  The patient has been on appropriate medication management for all of these issues and has had fairly good control.  Earlier this year in August the patient did have a stress test with good exercise tolerance and a fixed inferior myocardial perfusion defect consistent with previous infarct and/or scar.  Additionally an echocardiogram showed normally functioning aortic valve prosthesis with inferior hypokinesis consistent with previous myocardial infarction and ejection fraction of 40%.  The patient does have chronic kidney disease but did not have any need for further treatment with diuretics due to good fluid balances.  In September the patient had a fall and fractured his right femur for which she had surgical intervention and had no complications at all.  His postoperative care has done fairly well with no evidence of chest discomfort or anginal symptoms and improved proved symptoms of leg pain.  Recently though it appears that he had fallen or changed his right femoral rod which is caused significant amount of pain.  Additionally he has gained about 6 to 8 pounds has had lower extremity edema and overall is more short of breath.  He does have some PND and orthopnea as well.  When seen today in the emergency room the patient had a chest x-ray showing that he had bilateral pleural effusions and pulmonary edema as well as a BNP of 2713 and a troponin of 62/60.  These  are consistent with congestive heart failure issues consistent with systolic dysfunction congestive heart failure needing further intervention and treatment.  There is no evidence of acute coronary syndrome or myocardial infarction.  The patient therefore will need optimized medical care prior to surgical intervention at this time  Past Medical History:  Diagnosis Date  . Anemia   . Aortic aneurysm ([place])   . Aortic aneurysm ([place])   . Arthritis   . CKD (chronic kidney disease)    ALSO LEFT RENAL MASS  . Coronary artery disease   . Diverticulosis    WITH RUPTURE  . GERD (gastroesophageal reflux disease)   . History of hiatal hernia   . Hypercholesteremia   . Hypertension   . MGUS (monoclonal gammopathy of unknown significance)   . MGUS (monoclonal gammopathy of unknown significance)       Surgical History:  Past Surgical History:  Procedure Laterality Date  . AORTIC VALVE REPLACEMENT    . bipass    . CARDIAC VALVE REPLACEMENT     pig valve  . CHOLECYSTECTOMY    . COLON SURGERY    . CORONARY ANGIOPLASTY    . CORONARY ARTERY BYPASS GRAFT    . ESOPHAGOGASTRODUODENOSCOPY (EGD) WITH PROPOFOL N/A 02/07/2015   Procedure: ESOPHAGOGASTRODUODENOSCOPY (EGD) WITH PROPOFOL;  Surgeon: Hulen Luster, MD;  Location[facility] ENDOSCOPY;  Service: Gastroenterology;  Laterality: N/A;  . EYE SURGERY    . HERNIA REPAIR    . INTRAMEDULLARY (IM) NAIL INTERTROCHANTERIC Right 05/12/2020   Procedure: INTRAMEDULLARY (IM) NAIL INTERTROCHANTRIC;  Surgeon: Lovell Sheehan, MD;  Location: [facility] ORS;  Service: Orthopedics;  Laterality: Right;  .  JOINT REPLACEMENT     left knee x4  . KNEE ARTHROPLASTY Right 04/02/2016   Procedure: COMPUTER ASSISTED TOTAL KNEE ARTHROPLASTY;  Surgeon: Dereck Leep, MD;  Location: [facility] ORS;  Service: Orthopedics;  Laterality: Right;     Home Meds: Prior to Admission medications   Medication Sig Start Date End Date Taking? Authorizing Provider  amLODipine (NORVASC) 5 MG tablet Take 5  mg by mouth daily.   Yes [provider]  aspirin EC 81 MG tablet Take 81 mg by mouth daily. Swallow whole.   Yes [provider]  docusate sodium (COLACE) 100 MG capsule Take 1 capsule (100 mg total) by mouth 2 (two) times daily. 05/16/20  Yes Amin, Ankit Chirag, MD  fluticasone (FLONASE) 50 MCG/ACT nasal spray Place 2 sprays into both nostrils daily.   Yes [provider]  irbesartan (AVAPRO) 150 MG tablet Take 1 tablet by mouth 2 (two) times daily. 06/08/19  Yes [provider]  magnesium citrate SOLN Take 1 Bottle by mouth as needed for severe constipation.   Yes [provider]  Melatonin 3 MG TABS Take 1.5 mg by mouth at bedtime as needed (sleep).   Yes [provider]  polyethylene glycol (MIRALAX / GLYCOLAX) 17 g packet Take 17 g by mouth daily as needed for moderate constipation or severe constipation. 05/16/20  Yes Amin, Ankit Chirag, MD  pravastatin (PRAVACHOL) 20 MG tablet Take 20 mg by mouth every evening.   Yes [provider]  sodium bicarbonate 650 MG tablet Take 1,300 mg by mouth 2 (two) times daily.   Yes [provider]  traMADol-acetaminophen (ULTRACET) 37.5-325 MG tablet Take 1-2 tablets by mouth every 6 (six) hours as needed for severe pain. 05/16/20  Yes Amin, Ankit Chirag, MD  enoxaparin (LOVENOX) 40 MG/0.4ML injection Inject 0.4 mLs (40 mg total) into the skin daily for 14 days. 05/17/20 05/31/20  Damita Lack, MD  Salicylic Acid (BENSAL HP) 3 % OINT Apply to affected areas at buttocks 1-2 times daily Patient not taking: Reported on 08/05/2020 06/27/20   Ralene Bathe, MD    Inpatient Medications:  . amLODipine  5 mg Oral Daily  . docusate sodium  100 mg Oral BID  . furosemide  40 mg Intravenous Q12H  . irbesartan  150 mg Oral BID  . pravastatin  20 mg Oral QPM  . sodium bicarbonate  1,300 mg Oral BID  . sodium chloride flush  3 mL Intravenous Q12H   . sodium chloride      Allergies:   Allergies  Allergen Reactions  . Morphine Anaphylaxis  . Oxycodone Other (See Comments)    Hypotension and bradycardia  . Carvedilol     Dizziness and syncope   . Iodinated Diagnostic Agents Rash    Other reaction(s): Asthenia (finding), Other (qualifier value) paralyzed legs Other reaction(s): RASH   . Lisinopril Cough and Other (See Comments)    Social History   Socioeconomic History  . Marital status: Widowed    Spouse name: Not on file  . Number of children: Not on file  . Years of education: Not on file  . Highest education level: Not on file  Occupational History  . Not on file  Tobacco Use  . Smoking status: Former Research scientist (life sciences)  . Smokeless tobacco: Never Used  Substance and Sexual Activity  . Alcohol use: No  . Drug use: No  . Sexual activity: Not on file  Other Topics Concern  . Not on file  Social History Narrative  . Not on file   Social Determinants of Health   Financial Resource Strain:   . Difficulty of Paying Living Expenses: Not on file  Food Insecurity:   . Worried About Charity fundraiser in the Last Year: Not on file  . Ran Out of Food in the Last Year: Not on file  Transportation Needs:   . Lack of Transportation (Medical): Not on file  . Lack of Transportation (Non-Medical): Not on file  Physical Activity:   . Days of Exercise per Week: Not on file  . Minutes of Exercise per Session: Not on file  Stress:   . Feeling of Stress : Not on file  Social Connections:   . Frequency of Communication with Friends and Family: Not on file  . Frequency of Social Gatherings with Friends and Family: Not on file  . Attends Religious Services: Not on file  . Active Member of Clubs or Organizations: Not on file  . Attends Archivist Meetings: Not on file  . Marital Status: Not on file  Intimate Partner Violence:   . Fear of Current or Ex-Partner: Not on file  . Emotionally Abused: Not on file  . Physically Abused: Not on file  . Sexually  Abused: Not on file     Family History  Problem Relation Age of Onset  . Bladder Cancer Neg Hx   . Prostate cancer Neg Hx   . Kidney cancer Neg Hx      Review of Systems Positive for leg pain shortness of breath PND orthopnea Negative for: General:  chills, fever, night sweats or weight changes.  Cardiovascular: Positive for PND orthopnea negative for syncope dizziness  Dermatological skin lesions rashes Respiratory: Cough congestion Urologic: Frequent urination urination at night and hematuria Abdominal: negative for nausea, vomiting, diarrhea, bright red blood per rectum, melena, or hematemesis Neurologic: negative for visual changes, and/or hearing changes  All other systems reviewed and are otherwise negative except as noted above.  Labs: No results for input(s): CKTOTAL, CKMB, TROPONINI in the last 72 hours. Lab Results  Component Value Date   WBC 6.9 08/05/2020   HGB 12.6 (L) 08/05/2020   HCT 36.5 (L) 08/05/2020   MCV 90.1 08/05/2020   PLT 242 08/05/2020    Recent Labs  Lab 08/05/20 2018  NA 128*  K 4.6  CL 97*  CO2 21*  BUN 37*  CREATININE 1.72*  CALCIUM 8.7*  GLUCOSE 122*   No results found for: CHOL, HDL, LDLCALC, TRIG No results found for: DDIMER  Radiology/Studies:  CT FEMUR RIGHT WO CONTRAST  Result Date: 08/05/2020 CLINICAL DATA:  History of remote fracture with internal fixation. Fall today, pain. EXAM: CT OF THE LOWER RIGHT EXTREMITY WITHOUT CONTRAST TECHNIQUE: Multidetector CT imaging of the right lower extremity was performed according to the standard protocol. COMPARISON:  Plain films today FINDINGS: Changes of remote right femoral intertrochanteric fracture with internal fixation with hip screw and intramedullary nail. Fracture lines remain evident throughout the intertrochanteric region. This could reflect nonunion although cannot exclude re-injury/re-fracture. There is a nondisplaced acute fracture seen in the proximal shaft along the  intramedullary nail. No subluxation or dislocation. IMPRESSION: Extensive comminuted old right intertrochanteric fracture with internal fixation. Fracture lines remain evident and may be related to nonunion although re-fracture cannot be excluded. Acute nondisplaced fracture through the proximal femoral shaft adjacent to the intramedullary nail. Electronically Signed   By: Rolm Baptise M.D.   On: 08/05/2020 22:28  DG Chest Portable 1 View  Result Date: 08/05/2020 CLINICAL DATA:  Hip fracture.  Admission. EXAM: PORTABLE CHEST 1 VIEW COMPARISON:  May 11, 2020 FINDINGS: There is a large layering right pleural effusion with underlying atelectasis. There is a small left pleural effusion. Suspected mild edema. Stable cardiomegaly. The hila and mediastinum are unchanged. No pneumothorax. No other acute abnormalities. IMPRESSION: Cardiomegaly, right greater than left pleural effusions as above, and pulmonary edema. Electronically Signed   By: Dorise Bullion III M.D   On: 08/05/2020 20:52   DG Knee Complete 4 Views Right  Result Date: 08/05/2020 CLINICAL DATA:  Right hip pain.  Recent fall. EXAM: RIGHT KNEE - COMPLETE 4+ VIEW COMPARISON:  None. FINDINGS: The patient is status post right knee replacement. Visualized hardware is in good position. No joint effusion. No fracture identified. Vascular calcifications are noted. IMPRESSION: Right knee replacement. Hardware is in good position. No identified fracture. Electronically Signed   By: Dorise Bullion III M.D   On: 08/05/2020 20:51   DG Hip Unilat W or Wo Pelvis 2-3 Views Right  Result Date: 08/05/2020 CLINICAL DATA:  Right hip pain. EXAM: DG HIP (WITH OR WITHOUT PELVIS) 2-3V RIGHT COMPARISON:  05/11/2020 FINDINGS: Hip screw and intramedullary nail in place across a right femoral intertrochanteric fracture. There is a lucency now extending down into the proximal femoral shaft concerning for acute fracture. No subluxation or dislocation. IMPRESSION:  Evidence of remote fracture and internal fixation in the right intertrochanteric region. New lucency noted extending into the proximal femoral shaft adjacent to the intramedullary nail compatible with acute fracture. Electronically Signed   By: Rolm Baptise M.D.   On: 08/05/2020 20:56    EKG: Normal sinus rhythm with left intraventricular conduction defect unchanged from last year  Weights: Filed Weights   08/05/20 1706  Weight: 81.6 kg     Physical Exam: Blood pressure 135/90, pulse 89, temperature 98.3 F (36.8 C), resp. rate 19, height 5' 11.5" (1.816 m), weight 81.6 kg, SpO2 92 %. Body mass index is 24.76 kg/m. General: Well developed, well nourished, in no acute distress. Head eyes ears nose throat: Normocephalic, atraumatic, sclera non-icteric, no xanthomas, nares are without discharge. No apparent thyromegaly and/or mass  Lungs: Normal respiratory effort.  Few wheezes, basilar rales, no rhonchi.  Heart: RRR with normal S1 S2. no murmur gallop, no rub, PMI is normal size and placement, carotid upstroke normal without bruit, jugular venous pressure is normal Abdomen: Soft, non-tender, non-distended with normoactive bowel sounds. No hepatomegaly. No rebound/guarding. No obvious abdominal masses. Abdominal aorta is normal size without bruit Extremities: 1+ edema. no cyanosis, no clubbing, no ulcers  Peripheral : 2+ bilateral upper extremity pulses, 2+ bilateral femoral pulses, 2+ bilateral dorsal pedal pulse Neuro: Alert and oriented. No facial asymmetry. No focal deficit. Moves all extremities spontaneously. Musculoskeletal: Normal muscle tone without kyphosis Psych:  Responds to questions appropriately with a normal affect.    Assessment: 86 year old male with known coronary artery disease status post coronary artery bypass graft aortic valve replacement previous inferior myocardial infarction without evidence of new onset of myocardial infarction and/or acute coronary syndrome but  systolic dysfunction congestive heart failure with pulmonary edema and lower extremity edema with PND and orthopnea now with a significant worsening leg pain needing surgical intervention  Plan: 1.  Continue intravenous Lasix at 40 mg for which the patient has had response to and has had good urine output for acute on chronic systolic dysfunction congestive heart failure.  When the patient  has improved oxygenation with less PND orthopnea and no oxygen requirement after diuresis then he would be optimized for surgical intervention of his right femoral rod fracture 2.  Continuation of amlodipine for hypertension control and irbesartan for LV systolic dysfunction and avoid beta-blocker due to patient's concerns of bradycardia and side effects 3.  No further changes in high intensity cholesterol therapy with pravastatin 4.  Chest x-ray in the a.m. for further evaluation amount of pulmonary edema with further evaluation of oxygenation and optimization of above for further surgical intervention 5.  No further cardiac diagnostics necessary at this time  Signed, Corey Skains M.D. [facility] 08/06/2020, 11:42 AM

## 2020-08-06 NOTE — Plan of Care (Signed)

## 2020-08-06 NOTE — ED Notes (Signed)
Report to Dana, RN

## 2020-08-06 NOTE — ED Notes (Signed)
Pt's urinal emptied of 650mL. Pt requested pain medicine for 6/10 pain.

## 2020-08-06 NOTE — Plan of Care (Signed)

## 2020-08-06 NOTE — ED Notes (Signed)
Pt had 934ml of urine output

## 2020-08-06 NOTE — ED Notes (Signed)
Pt had 400 ml of urine ouuput

## 2020-08-06 NOTE — ED Notes (Signed)
Daughter updated on plan of care and bed status

## 2020-08-06 NOTE — ED Notes (Signed)
Cardiologist at bedside.  

## 2020-08-06 NOTE — Consult Note (Addendum)
ORTHOPAEDIC CONSULTATION  PATIENT NAME: [name] DOB: [birth year]  MRN: [id number]  REQUESTING PHYSICIAN: Colon Branch., MD  Chief Complaint: Right knee pain.   HPI: [name] is a 86 y.o. male who complains of  Right knee pain after a mechanical fall roughly 2 days ago. Pt is s/p long, unlocked, CMN roughly 3 months ago by Dr. Harlow Mares. Pt was doing well and released to all activity earlier this week. Pt had no hx of wound drainage, redness or erythema around time of surgery. Pt did complain of knee pain for the first 3-4 weeks after surgery similar to the pain currently. This went away and he was back to walking with assist device. After his fall he was able to bear weight and his knee pain didn't start until the next day. He previously had a well functioning TKA.   Pain is severe in his knee when he bears weight or moves the leg, he denies swelling of his knee, no numbness or tingling.    Past Medical History:  Diagnosis Date  . Anemia   . Aortic aneurysm ([place])   . Aortic aneurysm ([place])   . Arthritis   . CKD (chronic kidney disease)    ALSO LEFT RENAL MASS  . Coronary artery disease   . Diverticulosis    WITH RUPTURE  . GERD (gastroesophageal reflux disease)   . History of hiatal hernia   . Hypercholesteremia   . Hypertension   . MGUS (monoclonal gammopathy of unknown significance)   . MGUS (monoclonal gammopathy of unknown significance)    Past Surgical History:  Procedure Laterality Date  . AORTIC VALVE REPLACEMENT    . bipass    . CARDIAC VALVE REPLACEMENT     pig valve  . CHOLECYSTECTOMY    . COLON SURGERY    . CORONARY ANGIOPLASTY    . CORONARY ARTERY BYPASS GRAFT    . ESOPHAGOGASTRODUODENOSCOPY (EGD) WITH PROPOFOL N/A 02/07/2015   Procedure: ESOPHAGOGASTRODUODENOSCOPY (EGD) WITH PROPOFOL;  Surgeon: Hulen Luster, MD;  Location: [facility] ENDOSCOPY;  Service: Gastroenterology;  Laterality: N/A;  . EYE SURGERY    . HERNIA REPAIR    . INTRAMEDULLARY (IM) NAIL  INTERTROCHANTERIC Right 05/12/2020   Procedure: INTRAMEDULLARY (IM) NAIL INTERTROCHANTRIC;  Surgeon: Lovell Sheehan, MD;  Location: [facility] ORS;  Service: Orthopedics;  Laterality: Right;  . JOINT REPLACEMENT     left knee x4  . KNEE ARTHROPLASTY Right 04/02/2016   Procedure: COMPUTER ASSISTED TOTAL KNEE ARTHROPLASTY;  Surgeon: Dereck Leep, MD;  Location: [facility] ORS;  Service: Orthopedics;  Laterality: Right;   Social History   Socioeconomic History  . Marital status: Widowed    Spouse name: Not on file  . Number of children: Not on file  . Years of education: Not on file  . Highest education level: Not on file  Occupational History  . Not on file  Tobacco Use  . Smoking status: Former Research scientist (life sciences)  . Smokeless tobacco: Never Used  Substance and Sexual Activity  . Alcohol use: No  . Drug use: No  . Sexual activity: Not on file  Other Topics Concern  . Not on file  Social History Narrative  . Not on file   Social Determinants of Health   Financial Resource Strain:   . Difficulty of Paying Living Expenses: Not on file  Food Insecurity:   . Worried About Charity fundraiser in the Last Year: Not on file  . Ran Out of Food in the Last  Year: Not on file  Transportation Needs:   . Lack of Transportation (Medical): Not on file  . Lack of Transportation (Non-Medical): Not on file  Physical Activity:   . Days of Exercise per Week: Not on file  . Minutes of Exercise per Session: Not on file  Stress:   . Feeling of Stress : Not on file  Social Connections:   . Frequency of Communication with Friends and Family: Not on file  . Frequency of Social Gatherings with Friends and Family: Not on file  . Attends Religious Services: Not on file  . Active Member of Clubs or Organizations: Not on file  . Attends Archivist Meetings: Not on file  . Marital Status: Not on file   Family History  Problem Relation Age of Onset  . Bladder Cancer Neg Hx   . Prostate cancer Neg Hx   .  Kidney cancer Neg Hx    Allergies  Allergen Reactions  . Morphine Anaphylaxis  . Oxycodone Other (See Comments)    Hypotension and bradycardia  . Carvedilol     Dizziness and syncope   . Iodinated Diagnostic Agents Rash    Other reaction(s): Asthenia (finding), Other (qualifier value) paralyzed legs Other reaction(s): RASH   . Lisinopril Cough and Other (See Comments)   Prior to Admission medications   Medication Sig Start Date End Date Taking? Authorizing Provider  amLODipine (NORVASC) 5 MG tablet Take 5 mg by mouth daily.   Yes [provider]  aspirin EC 81 MG tablet Take 81 mg by mouth daily. Swallow whole.   Yes [provider]  docusate sodium (COLACE) 100 MG capsule Take 1 capsule (100 mg total) by mouth 2 (two) times daily. 05/16/20  Yes Amin, Ankit Chirag, MD  fluticasone (FLONASE) 50 MCG/ACT nasal spray Place 2 sprays into both nostrils daily.   Yes [provider]  irbesartan (AVAPRO) 150 MG tablet Take 1 tablet by mouth 2 (two) times daily. 06/08/19  Yes [provider]  magnesium citrate SOLN Take 1 Bottle by mouth as needed for severe constipation.   Yes [provider]  Melatonin 3 MG TABS Take 1.5 mg by mouth at bedtime as needed (sleep).   Yes [provider]  polyethylene glycol (MIRALAX / GLYCOLAX) 17 g packet Take 17 g by mouth daily as needed for moderate constipation or severe constipation. 05/16/20  Yes Amin, Ankit Chirag, MD  pravastatin (PRAVACHOL) 20 MG tablet Take 20 mg by mouth every evening.   Yes [provider]  sodium bicarbonate 650 MG tablet Take 1,300 mg by mouth 2 (two) times daily.   Yes [provider]  traMADol-acetaminophen (ULTRACET) 37.5-325 MG tablet Take 1-2 tablets by mouth every 6 (six) hours as needed for severe pain. 05/16/20  Yes Amin, Ankit Chirag, MD  enoxaparin (LOVENOX) 40 MG/0.4ML injection Inject 0.4 mLs (40 mg total) into the skin daily for 14 days. 05/17/20 05/31/20   Damita Lack, MD  Salicylic Acid (BENSAL HP) 3 % OINT Apply to affected areas at buttocks 1-2 times daily Patient not taking: Reported on 08/05/2020 06/27/20   Ralene Bathe, MD   CT FEMUR RIGHT WO CONTRAST  Result Date: 08/05/2020 CLINICAL DATA:  History of remote fracture with internal fixation. Fall today, pain. EXAM: CT OF THE LOWER RIGHT EXTREMITY WITHOUT CONTRAST TECHNIQUE: Multidetector CT imaging of the right lower extremity was performed according to the standard protocol. COMPARISON:  Plain films today FINDINGS: Changes of remote right femoral intertrochanteric  fracture with internal fixation with hip screw and intramedullary nail. Fracture lines remain evident throughout the intertrochanteric region. This could reflect nonunion although cannot exclude re-injury/re-fracture. There is a nondisplaced acute fracture seen in the proximal shaft along the intramedullary nail. No subluxation or dislocation. IMPRESSION: Extensive comminuted old right intertrochanteric fracture with internal fixation. Fracture lines remain evident and may be related to nonunion although re-fracture cannot be excluded. Acute nondisplaced fracture through the proximal femoral shaft adjacent to the intramedullary nail. Electronically Signed   By: Rolm Baptise M.D.   On: 08/05/2020 22:28   DG Chest Portable 1 View  Result Date: 08/05/2020 CLINICAL DATA:  Hip fracture.  Admission. EXAM: PORTABLE CHEST 1 VIEW COMPARISON:  May 11, 2020 FINDINGS: There is a large layering right pleural effusion with underlying atelectasis. There is a small left pleural effusion. Suspected mild edema. Stable cardiomegaly. The hila and mediastinum are unchanged. No pneumothorax. No other acute abnormalities. IMPRESSION: Cardiomegaly, right greater than left pleural effusions as above, and pulmonary edema. Electronically Signed   By: Dorise Bullion III M.D   On: 08/05/2020 20:52   DG Knee Complete 4 Views Right  Result Date:  08/05/2020 CLINICAL DATA:  Right hip pain.  Recent fall. EXAM: RIGHT KNEE - COMPLETE 4+ VIEW COMPARISON:  None. FINDINGS: The patient is status post right knee replacement. Visualized hardware is in good position. No joint effusion. No fracture identified. Vascular calcifications are noted. IMPRESSION: Right knee replacement. Hardware is in good position. No identified fracture. Electronically Signed   By: Dorise Bullion III M.D   On: 08/05/2020 20:51   DG Hip Unilat W or Wo Pelvis 2-3 Views Right  Result Date: 08/05/2020 CLINICAL DATA:  Right hip pain. EXAM: DG HIP (WITH OR WITHOUT PELVIS) 2-3V RIGHT COMPARISON:  05/11/2020 FINDINGS: Hip screw and intramedullary nail in place across a right femoral intertrochanteric fracture. There is a lucency now extending down into the proximal femoral shaft concerning for acute fracture. No subluxation or dislocation. IMPRESSION: Evidence of remote fracture and internal fixation in the right intertrochanteric region. New lucency noted extending into the proximal femoral shaft adjacent to the intramedullary nail compatible with acute fracture. Electronically Signed   By: Rolm Baptise M.D.   On: 08/05/2020 20:56   R hip and knee xrays: Nonunion of proximal femoral shaft vs. New acute fracture w/ controlled collapse of the implant. No evidence of implant failure. No evidence of acutely loose TKA implants  CT: Redemonstration of proximal femur fx w/ hardware in place. No evidence of fracture around his TKA femoral component.    Positive ROS: All other systems have been reviewed and were otherwise negative with the exception of those mentioned in the HPI and as above.  Physical Exam: General: Well developed, well nourished male seen in no acute distress. HEENT: Atraumatic and normocephalic. Sclera are clear. Extraocular motion is intact. Oropharynx is clear with moist mucosa. Neck: Supple, nontender, good range of motion. No JVD or carotid bruits. Lungs: Clear to  auscultation bilaterally. Cardiovascular: Regular rate and rhythm with normal S1 and S2. No murmurs. No gallops or rubs. Pedal pulses are palpable bilaterally. Homans test is negative bilaterally. No significant pretibial or ankle edema. Abdomen: Soft, nontender, and nondistended. Bowel sounds are present. Skin: No lesions in the area of chief complaint Neurologic: Awake, alert, and oriented. Sensory function is grossly intact. Motor strength is felt to be 5 over 5 bilaterally. No clonus or tremor. Good motor coordination. Lymphatic: No axillary or cervical lymphadenopathy  MUSCULOSKELETAL: RLE: Surgical incisions healed w/o erythema. Hip is NTTP. Supracondylar femur is TTP on medial and lateral side. NTTP along the joint line or epicondyles. NTTP over patella or quad tendon. No palpable gap in extensor mechanism. Log roll causes significant knee pain. SILT SPN/DPN/T nerves. Motor intact TA/EHL/GSC. Well perfused foot.   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 Harlow Mares, pts xrays do show an acute fx around his nail with stable collapse of his IT Fx. Discussed that his knee pain could be from the distal end of his nail moving in his supracondylar femur with his acute change of his proximal femoral fracture. He may be a candidate for revision fixation to treat his impending proximal femoral nonunion vs locking his nail distally to treat his acute fracture.   -I believe the patient would benefit from locking the nail distally and we will plan to proceed with that today. This also may benefit the healing of his proximal femoral fracture without going through complete revision today.  -NPO -To OR today if cleared.  -Discussed r/b/a of operative and nonoperative management. Discussed risks of nonoperative management being complications of bed rest to include, but not  limited to VTE, bed sores, pneumonia. Discussed that operative management has the goal of mitigating those risks by improving mobilization, but may not to be able to completely eliminate them. Discussed that operative management has the risks of, but not limited to, bleeding, infection, damage to surrounding nerves/blood vessels/cartilage/ligaments/tendsons, nonunion, malunion, hardware complications, periprosthetic fracture, continued pain, and potential for revision surgery. Further, it comes with the medical complications of anesthesia. The patient would like to proceed with operative intervention once optimized by the hospitalist service and deemed at an acceptable risk from the anesthesia provider.    Craig Guess. M.D.

## 2020-08-06 NOTE — ED Notes (Signed)
Report to Brandy, RN.

## 2020-08-06 NOTE — ED Notes (Signed)
Pt resting comfortably at this time. No distress noted. Pt states pain is tolerable at this time if he doesn't move leg. Pt denies any further needs at this time. Call bell in reach. Daughter updated on plan of care and pt status.

## 2020-08-06 NOTE — ED Notes (Signed)
Advised nurse that patient has assigned bed 

## 2020-08-06 NOTE — ED Notes (Signed)
Urinal emptied with 536ml with straw colored urine

## 2020-08-06 NOTE — Progress Notes (Signed)
PROGRESS NOTE    CHILD CAMPOY  BJY:[id number] DOB: [birth year] DOA: 08/05/2020 PCP: Baxter Hire, MD   Brief Narrative: Taken from H&P. [name] is a 86 y.o. male with medical history significant for stage III CKD, hypertension, dyslipidemia, anemia who presents to the ER for evaluation of pain in his right knee. Patient is s/p IM nailing for right hip fracture in August, 2021. He had a mechanical fall 2 days prior to this admission and presents with complaints of severe pain in the medial aspect of his right knee. He also endorsed some exertional dyspnea, orthopnea and PND. Had about 6 to 8 pound weight gain despite taking his home meds.  Found to have elevated BNP at 2713, troponin 62>>68, chest x-ray with bilateral pleural effusion, right greater than left and pulmonary edema, right lower extremity imaging with periprosthetic fracture of femur. He was started on IV diuresis, cardiology and orthopedic was consulted.  Subjective: Patient was complaining of right leg pain, stating that if somebody can flex his leg he will be fine. Shortness of breath improved with diuresis. Per patient at baseline he is very functional and exercise regularly. He lives alone and wants to go back to his home environment. He wants to avoid SNF stating that he had a very bad experience [facility] when he was sent there for rehab after his prior hip surgery.  Assessment & Plan:   Principal Problem:   Acute CHF (congestive heart failure) (HCC) Active Problems:   Benign essential HTN   Cardiomyopathy, ischemic   Chronic kidney disease   Periprosthetic fracture around internal prosthetic hip joint   Hyponatremia  Acute on chronic systolic heart failure. Patient symptoms and labs are concerning for acute on chronic systolic heart failure. Echocardiogram done in August 2021 with EF of 40 to 45%, multi valvular disease and a functional bioprosthetic aortic valve. Cardiology was consulted and they are  recommending diuresis before proceeding for any surgery. -Continue with Lasix 40 mg twice daily-diuresing well. -Daily BMP. -Daily weight. -Strict intake and output. -Continue home meds.  Right periprosthetic fracture of femur. Secondary to mechanical fall. Orthopedic was consulted and they are recommending intramedullary nail placement. Surgery will be delayed for more diuresis per cardiology recommendations. -Continue with pain management.  Elevated troponin. Patient has an history of CAD. No chest pain. Most likely secondary to demand ischemia with acute on chronic HFrEF. -Per cardiology there is no need for further investigation. -Holding home dose of aspirin for anticipated surgery in a day-will be resumed after surgery. -Continue statin  AKI with CKD stage IIIa. Most likely with volume up secondary to acute on chronic systolic failure. Creatinine at 1.72, baseline around 1.3-1.4. -Continue to monitor with diuresis. -Avoid nephrotoxins.  Hyponatremia. Most likely with hypervolemia. -We will monitor sodium while he is being diuresed.  Hypertension. Blood pressure within goal. -Continue home meds.  Objective: Vitals:   08/06/20 0245 08/06/20 0300 08/06/20 0348 08/06/20 1008  BP:   (!) 130/95 135/90  Pulse: 69 73 82 89  Resp: 12 15 17 19   Temp:      SpO2: 96% 99% 97% 92%  Weight:      Height:        Intake/Output Summary (Last 24 hours) at 08/06/2020 1424 Last data filed at 08/06/2020 1144 Gross per 24 hour  Intake 6 ml  Output 1550 ml  Net -1544 ml   Filed Weights   08/05/20 1706  Weight: 81.6 kg    Examination:  General  exam: Well-developed elderly man, appears calm and comfortable  Respiratory system: Bilateral basal crackles,. Respiratory effort normal. Cardiovascular system: S1 & S2 heard, RRR.  Gastrointestinal system: Soft, nontender, nondistended, bowel sounds positive. Central nervous system: Alert and oriented. No focal neurological  deficits.Symmetric 5 x 5 power. Extremities: 2+ LE edema, no cyanosis, pulses intact and symmetrical. Psychiatry: Judgement and insight appear normal. Mood & affect appropriate.    DVT prophylaxis: SCDs. Code Status: Full Family Communication: Discussed with patient Disposition Plan:  Status is: Inpatient  Remains inpatient appropriate because:Inpatient level of care appropriate due to severity of illness   Dispo: The patient is from: Home              Anticipated d/c is to: To be determined              Anticipated d/c date is: 3 days              Patient currently is not medically stable to d/c.   Consultants:   Orthopedic  Cardiology  Procedures:  Antimicrobials:   Data Reviewed: I have personally reviewed following labs and imaging studies  CBC: Recent Labs  Lab 08/05/20 2018  WBC 6.9  HGB 12.6*  HCT 36.5*  MCV 90.1  PLT 829   Basic Metabolic Panel: Recent Labs  Lab 08/05/20 2018  NA 128*  K 4.6  CL 97*  CO2 21*  GLUCOSE 122*  BUN 37*  CREATININE 1.72*  CALCIUM 8.7*   GFR: Estimated Creatinine Clearance: 32.7 mL/min (A) (by C-G formula based on SCr of 1.72 mg/dL (H)). Liver Function Tests: No results for input(s): AST, ALT, ALKPHOS, BILITOT, PROT, ALBUMIN in the last 168 hours. No results for input(s): LIPASE, AMYLASE in the last 168 hours. No results for input(s): AMMONIA in the last 168 hours. Coagulation Profile: No results for input(s): INR, PROTIME in the last 168 hours. Cardiac Enzymes: No results for input(s): CKTOTAL, CKMB, CKMBINDEX, TROPONINI in the last 168 hours. BNP (last 3 results) No results for input(s): PROBNP in the last 8760 hours. HbA1C: No results for input(s): HGBA1C in the last 72 hours. CBG: No results for input(s): GLUCAP in the last 168 hours. Lipid Profile: No results for input(s): CHOL, HDL, LDLCALC, TRIG, CHOLHDL, LDLDIRECT in the last 72 hours. Thyroid Function Tests: No results for input(s): TSH, T4TOTAL,  FREET4, T3FREE, THYROIDAB in the last 72 hours. Anemia Panel: No results for input(s): VITAMINB12, FOLATE, FERRITIN, TIBC, IRON, RETICCTPCT in the last 72 hours. Sepsis Labs: No results for input(s): PROCALCITON, LATICACIDVEN in the last 168 hours.  Recent Results (from the past 240 hour(s))  Resp Panel by RT-PCR (Flu A&B, Covid) Nasopharyngeal Swab     Status: None   Collection Time: 08/06/20 12:38 AM   Specimen: Nasopharyngeal Swab; Nasopharyngeal(NP) swabs in vial transport medium  Result Value Ref Range Status   SARS Coronavirus 2 by RT PCR NEGATIVE NEGATIVE Final    Comment: (NOTE) SARS-CoV-2 target nucleic acids are NOT DETECTED.  The SARS-CoV-2 RNA is generally detectable in upper respiratory specimens during the acute phase of infection. The lowest concentration of SARS-CoV-2 viral copies this assay can detect is 138 copies/mL. A negative result does not preclude SARS-Cov-2 infection and should not be used as the sole basis for treatment or other patient management decisions. A negative result may occur with  improper specimen collection/handling, submission of specimen other than nasopharyngeal swab, presence of viral mutation(s) within the areas targeted by this assay, and inadequate number of viral copies(<138  copies/mL). A negative result must be combined with clinical observations, patient history, and epidemiological information. The expected result is Negative.  Fact Sheet for Patients:  EntrepreneurPulse.com.au  Fact Sheet for Healthcare Providers:  IncredibleEmployment.be  This test is no t yet approved or cleared by the [place] FDA and  has been authorized for detection and/or diagnosis of SARS-CoV-2 by FDA under an Emergency Use Authorization (EUA). This EUA will remain  in effect (meaning this test can be used) for the duration of the COVID-19 declaration under Section 564(b)(1) of the Act, 21 U.S.C.section  360bbb-3(b)(1), unless the authorization is terminated  or revoked sooner.       Influenza A by PCR NEGATIVE NEGATIVE Final   Influenza B by PCR NEGATIVE NEGATIVE Final    Comment: (NOTE) The Xpert Xpress SARS-CoV-2/FLU/RSV plus assay is intended as an aid in the diagnosis of influenza from Nasopharyngeal swab specimens and should not be used as a sole basis for treatment. Nasal washings and aspirates are unacceptable for Xpert Xpress SARS-CoV-2/FLU/RSV testing.  Fact Sheet for Patients: EntrepreneurPulse.com.au  Fact Sheet for Healthcare Providers: IncredibleEmployment.be  This test is not yet approved or cleared by the [place] FDA and has been authorized for detection and/or diagnosis of SARS-CoV-2 by FDA under an Emergency Use Authorization (EUA). This EUA will remain in effect (meaning this test can be used) for the duration of the COVID-19 declaration under Section 564(b)(1) of the Act, 21 U.S.C. section 360bbb-3(b)(1), unless the authorization is terminated or revoked.  Performed at [facility], [address] [place], [place] [address]      Radiology Studies: CT FEMUR RIGHT WO CONTRAST  Result Date: 08/05/2020 CLINICAL DATA:  History of remote fracture with internal fixation. Fall today, pain. EXAM: CT OF THE LOWER RIGHT EXTREMITY WITHOUT CONTRAST TECHNIQUE: Multidetector CT imaging of the right lower extremity was performed according to the standard protocol. COMPARISON:  Plain films today FINDINGS: Changes of remote right femoral intertrochanteric fracture with internal fixation with hip screw and intramedullary nail. Fracture lines remain evident throughout the intertrochanteric region. This could reflect nonunion although cannot exclude re-injury/re-fracture. There is a nondisplaced acute fracture seen in the proximal shaft along the intramedullary nail. No subluxation or dislocation. IMPRESSION: Extensive comminuted  old right intertrochanteric fracture with internal fixation. Fracture lines remain evident and may be related to nonunion although re-fracture cannot be excluded. Acute nondisplaced fracture through the proximal femoral shaft adjacent to the intramedullary nail. Electronically Signed   By: Rolm Baptise M.D.   On: 08/05/2020 22:28   DG Chest Portable 1 View  Result Date: 08/05/2020 CLINICAL DATA:  Hip fracture.  Admission. EXAM: PORTABLE CHEST 1 VIEW COMPARISON:  May 11, 2020 FINDINGS: There is a large layering right pleural effusion with underlying atelectasis. There is a small left pleural effusion. Suspected mild edema. Stable cardiomegaly. The hila and mediastinum are unchanged. No pneumothorax. No other acute abnormalities. IMPRESSION: Cardiomegaly, right greater than left pleural effusions as above, and pulmonary edema. Electronically Signed   By: Dorise Bullion III M.D   On: 08/05/2020 20:52   DG Knee Complete 4 Views Right  Result Date: 08/05/2020 CLINICAL DATA:  Right hip pain.  Recent fall. EXAM: RIGHT KNEE - COMPLETE 4+ VIEW COMPARISON:  None. FINDINGS: The patient is status post right knee replacement. Visualized hardware is in good position. No joint effusion. No fracture identified. Vascular calcifications are noted. IMPRESSION: Right knee replacement. Hardware is in good position. No identified fracture. Electronically Signed   By:  Dorise Bullion III M.D   On: 08/05/2020 20:51   DG Hip Unilat W or Wo Pelvis 2-3 Views Right  Result Date: 08/05/2020 CLINICAL DATA:  Right hip pain. EXAM: DG HIP (WITH OR WITHOUT PELVIS) 2-3V RIGHT COMPARISON:  05/11/2020 FINDINGS: Hip screw and intramedullary nail in place across a right femoral intertrochanteric fracture. There is a lucency now extending down into the proximal femoral shaft concerning for acute fracture. No subluxation or dislocation. IMPRESSION: Evidence of remote fracture and internal fixation in the right intertrochanteric region.  New lucency noted extending into the proximal femoral shaft adjacent to the intramedullary nail compatible with acute fracture. Electronically Signed   By: Rolm Baptise M.D.   On: 08/05/2020 20:56    Scheduled Meds:  amLODipine  5 mg Oral Daily   docusate sodium  100 mg Oral BID   furosemide  40 mg Intravenous Q12H   irbesartan  150 mg Oral BID   pravastatin  20 mg Oral QPM   sodium bicarbonate  1,300 mg Oral BID   sodium chloride flush  3 mL Intravenous Q12H   Continuous Infusions:  sodium chloride       LOS: 1 day   Time spent: 40 minutes  Lorella Nimrod, MD [facility]  If 7PM-7AM, please contact night-coverage [email or web address]  08/06/2020, 2:24 PM   This record has been created using Systems analyst. Errors have been sought and corrected,but may not always be located. Such creation errors do not reflect on the standard of care.

## 2020-08-07 ENCOUNTER — Inpatient Hospital Stay: Payer: PPO

## 2020-08-07 DIAGNOSIS — L8990 Pressure ulcer of unspecified site, unspecified stage: Secondary | ICD-10-CM

## 2020-08-07 HISTORY — DX: Pressure ulcer of unspecified site, unspecified stage: L89.90

## 2020-08-07 LAB — BASIC METABOLIC PANEL
Anion gap: 12 (ref 5–15)
BUN: 36 mg/dL — ABNORMAL HIGH (ref 8–23)
CO2: 23 mmol/L (ref 22–32)
Calcium: 8.7 mg/dL — ABNORMAL LOW (ref 8.9–10.3)
Chloride: 95 mmol/L — ABNORMAL LOW (ref 98–111)
Creatinine, Ser: 1.74 mg/dL — ABNORMAL HIGH (ref 0.61–1.24)
GFR, Estimated: 37 mL/min — ABNORMAL LOW (ref >60)
Glucose, Bld: 92 mg/dL (ref 70–99)
Potassium: 3.9 mmol/L (ref 3.5–5.1)
Sodium: 130 mmol/L — ABNORMAL LOW (ref 135–145)

## 2020-08-07 LAB — SURGICAL PCR SCREEN
MRSA, PCR: NEGATIVE
Staphylococcus aureus: NEGATIVE

## 2020-08-07 MED ORDER — FUROSEMIDE 10 MG/ML IJ SOLN
40.0000 mg | Freq: Every day | INTRAMUSCULAR | Status: DC
  Administered 2020-08-07: 40 mg via INTRAVENOUS
  Filled 2020-08-07: qty 4

## 2020-08-07 MED ORDER — CEFAZOLIN SODIUM-DEXTROSE 2-4 GM/100ML-% IV SOLN
2.0000 g | INTRAVENOUS | Status: AC
  Administered 2020-08-08: 2 g via INTRAVENOUS
  Filled 2020-08-07: qty 100

## 2020-08-07 MED ORDER — MUPIROCIN 2 % EX OINT
1.0000 "application " | TOPICAL_OINTMENT | Freq: Two times a day (BID) | CUTANEOUS | Status: DC
  Administered 2020-08-07 – 2020-08-08 (×3): 1 via NASAL
  Filled 2020-08-07: qty 22

## 2020-08-07 NOTE — Plan of Care (Signed)

## 2020-08-07 NOTE — Progress Notes (Signed)
[facility] Encounter Note  Patient: [name] / Admit Date: 08/05/2020 / Date of Encounter: 08/07/2020, 8:00 AM   Subjective: 11/21 patient has felt a significant amount better from yesterday till today with a significant improvements in shortness of breath.  There is been no evidence of chest pain and or PND orthopnea at this time.  There is been no evidence of acute coronary syndrome with a troponin of 62 and 60.  BNP was peaked at 2713 with a chest x-ray showing continued mild pulmonary edema and bilateral small pleural effusions but significantly improved from previous day.  EKG continues to show normal sinus rhythm with left interventricular conduction defect and telemetry is the same.  The patient does still have right leg pain and will need surgical intervention although patient may need slight more medical management for lowest risk with surgical intervention  Review of Systems: Positive for: Right leg pain Negative for: Vision change, hearing change, syncope, dizziness, nausea, vomiting,diarrhea, bloody stool, stomach pain, cough, congestion, diaphoresis, urinary frequency, urinary pain,skin lesions, skin rashes Others previously listed  Objective: Telemetry: Normal sinus rhythm with bundle branch block Physical Exam: Blood pressure (!) 116/95, pulse 71, temperature 97.9 F (36.6 C), resp. rate 18, height 5\' 11"  (1.803 m), weight 78.6 kg, SpO2 96 %. Body mass index is 24.16 kg/m. General: Well developed, well nourished, in no acute distress. Head: Normocephalic, atraumatic, sclera non-icteric, no xanthomas, nares are without discharge. Neck: No apparent masses Lungs: Normal respirations with no wheezes, no rhonchi, basilar rales , some crackles   Heart: Regular rate and rhythm, normal S1 S2, 2+ aortic murmur, no rub, no gallop, PMI is normal size and placement, carotid upstroke normal without bruit, jugular venous pressure normal Abdomen: Soft,  non-tender, non-distended with normoactive bowel sounds. No hepatosplenomegaly. Abdominal aorta is normal size without bruit Extremities: Trace edema, no clubbing, no cyanosis, no ulcers,  Peripheral: 2+ radial, 2+ femoral, 2+ dorsal pedal pulses Neuro: Alert and oriented. Moves all extremities spontaneously. Psych:  Responds to questions appropriately with a normal affect.   Intake/Output Summary (Last 24 hours) at 08/07/2020 0800 Last data filed at 08/07/2020 1062 Gross per 24 hour  Intake 123 ml  Output 2930 ml  Net -2807 ml    Inpatient Medications:   amLODipine  5 mg Oral Daily   docusate sodium  100 mg Oral BID   furosemide  40 mg Intravenous Daily   irbesartan  150 mg Oral BID   pravastatin  20 mg Oral QPM   sodium bicarbonate  1,300 mg Oral BID   sodium chloride flush  3 mL Intravenous Q12H   Infusions:   sodium chloride      Labs: Recent Labs    08/06/20 0500 08/07/20 0429  NA 128* 130*  K 4.8 3.9  CL 98 95*  CO2 19* 23  GLUCOSE 116* 92  BUN 37* 36*  CREATININE 1.68* 1.74*  CALCIUM 8.7* 8.7*   No results for input(s): AST, ALT, ALKPHOS, BILITOT, PROT, ALBUMIN in the last 72 hours. Recent Labs    08/05/20 2018  WBC 6.9  HGB 12.6*  HCT 36.5*  MCV 90.1  PLT 242   No results for input(s): CKTOTAL, CKMB, TROPONINI in the last 72 hours. Invalid input(s): POCBNP No results for input(s): HGBA1C in the last 72 hours.   Weights: Filed Weights   08/05/20 1706 08/06/20 1623 08/07/20 0500  Weight: 81.6 kg 78.8 kg 78.6 kg     Radiology/Studies:  CT FEMUR RIGHT  WO CONTRAST  Result Date: 08/05/2020 CLINICAL DATA:  History of remote fracture with internal fixation. Fall today, pain. EXAM: CT OF THE LOWER RIGHT EXTREMITY WITHOUT CONTRAST TECHNIQUE: Multidetector CT imaging of the right lower extremity was performed according to the standard protocol. COMPARISON:  Plain films today FINDINGS: Changes of remote right femoral intertrochanteric fracture  with internal fixation with hip screw and intramedullary nail. Fracture lines remain evident throughout the intertrochanteric region. This could reflect nonunion although cannot exclude re-injury/re-fracture. There is a nondisplaced acute fracture seen in the proximal shaft along the intramedullary nail. No subluxation or dislocation. IMPRESSION: Extensive comminuted old right intertrochanteric fracture with internal fixation. Fracture lines remain evident and may be related to nonunion although re-fracture cannot be excluded. Acute nondisplaced fracture through the proximal femoral shaft adjacent to the intramedullary nail. Electronically Signed   By: Rolm Baptise M.D.   On: 08/05/2020 22:28   DG Chest Port 1 View  Result Date: 08/07/2020 CLINICAL DATA:  Initial evaluation for CHF. EXAM: PORTABLE CHEST 1 VIEW COMPARISON:  Prior radiograph from 08/05/2020. FINDINGS: Median sternotomy wires underlying CABG markers and valvular prosthesis. Cardiomegaly, stable. Mediastinal silhouette within normal limits. Lungs mildly hypoinflated. Diffuse vascular and interstitial prominence, compatible with pulmonary interstitial edema, slightly greater within the right lung as compared to the left. Overall, appearance is mildly improved from previous. Right greater than left pleural effusions, also slightly improved. Associated mild bibasilar atelectasis. Few more hazy opacities overlying the right upper lung favored to be related to edema. No definite focal infiltrates. No pneumothorax. Few prominent gaseous loops of bowel noted within the upper abdomen. No acute osseous finding. IMPRESSION: 1. Cardiomegaly with moderate diffuse pulmonary interstitial edema and bilateral pleural effusions, overall mildly improved from previous. 2. Superimposed hazy opacities overlying the right upper lung, favored to be related to edema. No definite focal infiltrates. Electronically Signed   By: Jeannine Boga M.D.   On: 08/07/2020  05:09   DG Chest Portable 1 View  Result Date: 08/05/2020 CLINICAL DATA:  Hip fracture.  Admission. EXAM: PORTABLE CHEST 1 VIEW COMPARISON:  May 11, 2020 FINDINGS: There is a large layering right pleural effusion with underlying atelectasis. There is a small left pleural effusion. Suspected mild edema. Stable cardiomegaly. The hila and mediastinum are unchanged. No pneumothorax. No other acute abnormalities. IMPRESSION: Cardiomegaly, right greater than left pleural effusions as above, and pulmonary edema. Electronically Signed   By: Dorise Bullion III M.D   On: 08/05/2020 20:52   DG Knee Complete 4 Views Right  Result Date: 08/05/2020 CLINICAL DATA:  Right hip pain.  Recent fall. EXAM: RIGHT KNEE - COMPLETE 4+ VIEW COMPARISON:  None. FINDINGS: The patient is status post right knee replacement. Visualized hardware is in good position. No joint effusion. No fracture identified. Vascular calcifications are noted. IMPRESSION: Right knee replacement. Hardware is in good position. No identified fracture. Electronically Signed   By: Dorise Bullion III M.D   On: 08/05/2020 20:51   DG Hip Unilat W or Wo Pelvis 2-3 Views Right  Result Date: 08/05/2020 CLINICAL DATA:  Right hip pain. EXAM: DG HIP (WITH OR WITHOUT PELVIS) 2-3V RIGHT COMPARISON:  05/11/2020 FINDINGS: Hip screw and intramedullary nail in place across a right femoral intertrochanteric fracture. There is a lucency now extending down into the proximal femoral shaft concerning for acute fracture. No subluxation or dislocation. IMPRESSION: Evidence of remote fracture and internal fixation in the right intertrochanteric region. New lucency noted extending into the proximal femoral shaft adjacent to the  intramedullary nail compatible with acute fracture. Electronically Signed   By: Rolm Baptise M.D.   On: 08/05/2020 20:56     Assessment and Recommendation  86 y.o. male with known coronary artery disease status post coronary artery bypass graft  and aortic valve replacement hypertension hyperlipidemia chronic kidney disease stage III with acute right leg pain needing further surgical intervention but also systolic dysfunction congestive heart failure with previous history of inferior hypokinesis with ejection fraction of 40% and recent stress test showing no evidence of ischemia but infarct in the inferior wall improving with symptoms of heart failure and no current evidence of acute coronary syndrome  Acute on chronic systolic dysfunction congestive heart failure Patient is improving with this issue with continued intravenous diuresis with less symptoms less oxygenation requirement and good urine output without worsening of chronic kidney disease.  We will plan on continuation of intravenous Lasix for the next 24 hours for improvements  Coronary artery disease with aortic valve replacement No further evidence of myocardial infarction and/or acute coronary syndrome with stability of coronary artery disease at this time  Plan 1.  Continuation of furosemide intravenously throughout today for further treatment of acute on chronic systolic dysfunction congestive heart failure 2.  No change in hypertension medication management including amlodipine ibesartan 3.  Continuation of medication management for high intensity cholesterol therapy including pravastatin 4.  No further cardiac intervention or diagnostics necessary at this time 5.  Further consideration of surgical intervention of orthopedic and knee pain and/or femur pain when optimized for above acute on chronic systolic dysfunction heart failure.  It would appear that Monday would be an ideal time  Signed, Serafina Royals M.D. FACC

## 2020-08-07 NOTE — Progress Notes (Signed)
Subjective:  Patient reports pain as moderate.  Primarily knee pain with any leg movement. No numbness or tingling.  Objective:   VITALS:   Vitals:   08/07/20 0344 08/07/20 0500 08/07/20 0727 08/07/20 1130  BP: 121/84  (!) 116/95 (!) 117/94  Pulse: 84  71 93  Resp: 15  18 18   Temp: 97.8 F (36.6 C)  97.9 F (36.6 C) 97.9 F (36.6 C)  TempSrc:      SpO2: 96%  96% 96%  Weight:  78.6 kg    Height:        PHYSICAL EXAM:  ABD soft Sensation intact distally Dorsiflexion/Plantar flexion intact Incision: c/d/i No cellulitis present Compartment soft  LABS  Results for orders placed or performed during the hospital encounter of 08/05/20 (from the past 24 hour(s))  Basic metabolic panel     Status: Abnormal   Collection Time: 08/07/20  4:29 AM  Result Value Ref Range   Sodium 130 (L) 135 - 145 mmol/L   Potassium 3.9 3.5 - 5.1 mmol/L   Chloride 95 (L) 98 - 111 mmol/L   CO2 23 22 - 32 mmol/L   Glucose, Bld 92 70 - 99 mg/dL   BUN 36 (H) 8 - 23 mg/dL   Creatinine, Ser 1.74 (H) 0.61 - 1.24 mg/dL   Calcium 8.7 (L) 8.9 - 10.3 mg/dL   GFR, Estimated 37 (L) >60 mL/min   Anion gap 12 5 - 15    CT FEMUR RIGHT WO CONTRAST  Result Date: 08/05/2020 CLINICAL DATA:  History of remote fracture with internal fixation. Fall today, pain. EXAM: CT OF THE LOWER RIGHT EXTREMITY WITHOUT CONTRAST TECHNIQUE: Multidetector CT imaging of the right lower extremity was performed according to the standard protocol. COMPARISON:  Plain films today FINDINGS: Changes of remote right femoral intertrochanteric fracture with internal fixation with hip screw and intramedullary nail. Fracture lines remain evident throughout the intertrochanteric region. This could reflect nonunion although cannot exclude re-injury/re-fracture. There is a nondisplaced acute fracture seen in the proximal shaft along the intramedullary nail. No subluxation or dislocation. IMPRESSION: Extensive comminuted old right intertrochanteric  fracture with internal fixation. Fracture lines remain evident and may be related to nonunion although re-fracture cannot be excluded. Acute nondisplaced fracture through the proximal femoral shaft adjacent to the intramedullary nail. Electronically Signed   By: Rolm Baptise M.D.   On: 08/05/2020 22:28   DG Chest Port 1 View  Result Date: 08/07/2020 CLINICAL DATA:  Initial evaluation for CHF. EXAM: PORTABLE CHEST 1 VIEW COMPARISON:  Prior radiograph from 08/05/2020. FINDINGS: Median sternotomy wires underlying CABG markers and valvular prosthesis. Cardiomegaly, stable. Mediastinal silhouette within normal limits. Lungs mildly hypoinflated. Diffuse vascular and interstitial prominence, compatible with pulmonary interstitial edema, slightly greater within the right lung as compared to the left. Overall, appearance is mildly improved from previous. Right greater than left pleural effusions, also slightly improved. Associated mild bibasilar atelectasis. Few more hazy opacities overlying the right upper lung favored to be related to edema. No definite focal infiltrates. No pneumothorax. Few prominent gaseous loops of bowel noted within the upper abdomen. No acute osseous finding. IMPRESSION: 1. Cardiomegaly with moderate diffuse pulmonary interstitial edema and bilateral pleural effusions, overall mildly improved from previous. 2. Superimposed hazy opacities overlying the right upper lung, favored to be related to edema. No definite focal infiltrates. Electronically Signed   By: Jeannine Boga M.D.   On: 08/07/2020 05:09   DG Chest Portable 1 View  Result Date: 08/05/2020 CLINICAL DATA:  Hip  fracture.  Admission. EXAM: PORTABLE CHEST 1 VIEW COMPARISON:  May 11, 2020 FINDINGS: There is a large layering right pleural effusion with underlying atelectasis. There is a small left pleural effusion. Suspected mild edema. Stable cardiomegaly. The hila and mediastinum are unchanged. No pneumothorax. No other  acute abnormalities. IMPRESSION: Cardiomegaly, right greater than left pleural effusions as above, and pulmonary edema. Electronically Signed   By: Dorise Bullion III M.D   On: 08/05/2020 20:52   DG Knee Complete 4 Views Right  Result Date: 08/05/2020 CLINICAL DATA:  Right hip pain.  Recent fall. EXAM: RIGHT KNEE - COMPLETE 4+ VIEW COMPARISON:  None. FINDINGS: The patient is status post right knee replacement. Visualized hardware is in good position. No joint effusion. No fracture identified. Vascular calcifications are noted. IMPRESSION: Right knee replacement. Hardware is in good position. No identified fracture. Electronically Signed   By: Dorise Bullion III M.D   On: 08/05/2020 20:51   DG Hip Unilat W or Wo Pelvis 2-3 Views Right  Result Date: 08/05/2020 CLINICAL DATA:  Right hip pain. EXAM: DG HIP (WITH OR WITHOUT PELVIS) 2-3V RIGHT COMPARISON:  05/11/2020 FINDINGS: Hip screw and intramedullary nail in place across a right femoral intertrochanteric fracture. There is a lucency now extending down into the proximal femoral shaft concerning for acute fracture. No subluxation or dislocation. IMPRESSION: Evidence of remote fracture and internal fixation in the right intertrochanteric region. New lucency noted extending into the proximal femoral shaft adjacent to the intramedullary nail compatible with acute fracture. Electronically Signed   By: Rolm Baptise M.D.   On: 08/05/2020 20:56    Assessment/Plan:     Principal Problem:   CHF (congestive heart failure) (HCC) Active Problems:   Benign essential HTN   Cardiomyopathy, ischemic   Chronic kidney disease   Periprosthetic fracture around internal prosthetic hip joint   Hyponatremia   Pressure injury of skin   Plan to revise the right TFN with distal interlocking screws. Continue with current medical management. NPO after midnite tonite and OR tomorrow afternoon.  The diagnosis, risks, benefits and alternatives to treatment are all  discussed in detail with the patient and family. Risks include but are not limited to bleeding, infection, deep vein thrombosis, pulmonary embolism, nerve or vascular injury, non-union, repeat operation, persistent pain, weakness, stiffness and death. He understands and is eager to proceed.    Lovell Sheehan , MD 08/07/2020, 12:00 PM

## 2020-08-07 NOTE — Progress Notes (Signed)
PROGRESS NOTE    [name]  JOI:[id number] DOB: 11/10/1930 DOA: 08/05/2020 PCP: Baxter Hire, MD   Brief Narrative: Taken from H&P. DAVIUS Holt is a 86 y.o. male with medical history significant for stage III CKD, hypertension, dyslipidemia, anemia who presents to the ER for evaluation of pain in his right knee. Patient is s/p IM nailing for right hip fracture in August, 2021. He had a mechanical fall 2 days prior to this admission and presents with complaints of severe pain in the medial aspect of his right knee. He also endorsed some exertional dyspnea, orthopnea and PND. Had about 6 to 8 pound weight gain despite taking his home meds.  Found to have elevated BNP at 2713, troponin 62>>68, chest x-ray with bilateral pleural effusion, right greater than left and pulmonary edema, right lower extremity imaging with periprosthetic fracture of femur. He was started on IV diuresis, cardiology and orthopedic was consulted. Cardiology wants little more diuresis and surgery was planned for Monday.  Subjective: Patient was very upset when I entered his room stating that nobody from orthopedic has seen him yet that he was supposed to get his surgery today as cardiology cleared him.  He refused to talk about anything else. Later after communicating with orthopedic and seeing cardiology note it was recommended for tomorrow and continue with IV diuresis.  I tried explaining to him by calling his room number but he got even more upset stating that lady you do not know anything and hanged up.   Assessment & Plan:   Principal Problem:   CHF (congestive heart failure) (HCC) Active Problems:   Benign essential HTN   Cardiomyopathy, ischemic   Chronic kidney disease   Periprosthetic fracture around internal prosthetic hip joint   Hyponatremia   Pressure injury of skin  Acute on chronic systolic heart failure. Patient symptoms and labs are concerning for acute on chronic systolic heart  failure.  BNP elevated at 2713 and echocardiogram done in August 2021 with EF of 40 to 45%, multi valvular disease and a functional bioprosthetic aortic valve. Cardiology was consulted and they are recommending diuresis before proceeding for any surgery, they are recommending ideally tomorrow. There was a small increase in his creatinine today.  About 3 L of urinary output in 24-hour, net negative of more than 4 L. -Decrease Lasix to 40 mg IV daily instead of twice daily. -Daily BMP. -Daily weight. -Strict intake and output. -Continue home meds.  Right periprosthetic fracture of femur. Secondary to mechanical fall. Orthopedic was consulted and they are recommending intramedullary nail placement.  Surgery will be done on Monday according to cardiology recommendations. -Continue with pain management.  Elevated troponin. Patient has an history of CAD. No chest pain. Most likely secondary to demand ischemia with acute on chronic HFrEF. -Per cardiology there is no need for further investigation. -Holding home dose of aspirin for anticipated surgery in a day-will be resumed after surgery. -Continue statin  AKI with CKD stage IIIa. Most likely with volume up secondary to acute on chronic systolic failure. Creatinine at 1.74, mild increase from yesterday, baseline around 1.3-1.4. -Decreasing the dose of Lasix -Continue to monitor with diuresis. -Avoid nephrotoxins.  Hyponatremia. Most likely with hypervolemia, seems improving with diuresis, sodium of 130 today. -Continue to monitor.  Hypertension. Blood pressure within goal. -Continue home meds.  Objective: Vitals:   08/07/20 0344 08/07/20 0500 08/07/20 0727 08/07/20 1130  BP: 121/84  (!) 116/95 (!) 117/94  Pulse: 84  71 93  Resp: 15  18 18   Temp: 97.8 F (36.6 C)  97.9 F (36.6 C) 97.9 F (36.6 C)  TempSrc:      SpO2: 96%  96% 96%  Weight:  78.6 kg    Height:        Intake/Output Summary (Last 24 hours) at 08/07/2020 1146 Last  data filed at 08/07/2020 1130 Gross per 24 hour  Intake 120 ml  Output 2930 ml  Net -2810 ml   Filed Weights   08/05/20 1706 08/06/20 1623 08/07/20 0500  Weight: 81.6 kg 78.8 kg 78.6 kg    Examination:  General.  Well-developed, angry elderly man, in no acute distress. Pulmonary.  Lungs clear bilaterally, normal respiratory effort. CV.  Regular rate and rhythm, no JVD, rub or murmur. Abdomen.  Soft, nontender, nondistended, BS positive. CNS.  Alert and oriented x3.  No focal neurologic deficit. Extremities.  1+ LE edema, no cyanosis, pulses intact and symmetrical. Psychiatry.  Judgment and insight appears normal.    DVT prophylaxis: SCDs. Code Status: Full Family Communication: Discussed with patient Disposition Plan:  Status is: Inpatient  Remains inpatient appropriate because:Inpatient level of care appropriate due to severity of illness   Dispo: The patient is from: Home              Anticipated d/c is to: To be determined              Anticipated d/c date is: 3 days              Patient currently is not medically stable to d/c.   Consultants:   Orthopedic  Cardiology  Procedures:  Antimicrobials:   Data Reviewed: I have personally reviewed following labs and imaging studies  CBC: Recent Labs  Lab 08/05/20 2018  WBC 6.9  HGB 12.6*  HCT 36.5*  MCV 90.1  PLT 433   Basic Metabolic Panel: Recent Labs  Lab 08/05/20 2018 08/06/20 0500 08/07/20 0429  NA 128* 128* 130*  K 4.6 4.8 3.9  CL 97* 98 95*  CO2 21* 19* 23  GLUCOSE 122* 116* 92  BUN 37* 37* 36*  CREATININE 1.72* 1.68* 1.74*  CALCIUM 8.7* 8.7* 8.7*   GFR: Estimated Creatinine Clearance: 31.9 mL/min (A) (by C-G formula based on SCr of 1.74 mg/dL (H)). Liver Function Tests: No results for input(s): AST, ALT, ALKPHOS, BILITOT, PROT, ALBUMIN in the last 168 hours. No results for input(s): LIPASE, AMYLASE in the last 168 hours. No results for input(s): AMMONIA in the last 168  hours. Coagulation Profile: No results for input(s): INR, PROTIME in the last 168 hours. Cardiac Enzymes: No results for input(s): CKTOTAL, CKMB, CKMBINDEX, TROPONINI in the last 168 hours. BNP (last 3 results) No results for input(s): PROBNP in the last 8760 hours. HbA1C: No results for input(s): HGBA1C in the last 72 hours. CBG: No results for input(s): GLUCAP in the last 168 hours. Lipid Profile: No results for input(s): CHOL, HDL, LDLCALC, TRIG, CHOLHDL, LDLDIRECT in the last 72 hours. Thyroid Function Tests: No results for input(s): TSH, T4TOTAL, FREET4, T3FREE, THYROIDAB in the last 72 hours. Anemia Panel: No results for input(s): VITAMINB12, FOLATE, FERRITIN, TIBC, IRON, RETICCTPCT in the last 72 hours. Sepsis Labs: No results for input(s): PROCALCITON, LATICACIDVEN in the last 168 hours.  Recent Results (from the past 240 hour(s))  Resp Panel by RT-PCR (Flu A&B, Covid) Nasopharyngeal Swab     Status: None   Collection Time: 08/06/20 12:38 AM   Specimen: Nasopharyngeal Swab; Nasopharyngeal(NP) swabs in  vial transport medium  Result Value Ref Range Status   SARS Coronavirus 2 by RT PCR NEGATIVE NEGATIVE Final    Comment: (NOTE) SARS-CoV-2 target nucleic acids are NOT DETECTED.  The SARS-CoV-2 RNA is generally detectable in upper respiratory specimens during the acute phase of infection. The lowest concentration of SARS-CoV-2 viral copies this assay can detect is 138 copies/mL. A negative result does not preclude SARS-Cov-2 infection and should not be used as the sole basis for treatment or other patient management decisions. A negative result may occur with  improper specimen collection/handling, submission of specimen other than nasopharyngeal swab, presence of viral mutation(s) within the areas targeted by this assay, and inadequate number of viral copies(<138 copies/mL). A negative result must be combined with clinical observations, patient history, and  epidemiological information. The expected result is Negative.  Fact Sheet for Patients:  EntrepreneurPulse.com.au  Fact Sheet for Healthcare Providers:  IncredibleEmployment.be  This test is no t yet approved or cleared by the [place] FDA and  has been authorized for detection and/or diagnosis of SARS-CoV-2 by FDA under an Emergency Use Authorization (EUA). This EUA will remain  in effect (meaning this test can be used) for the duration of the COVID-19 declaration under Section 564(b)(1) of the Act, 21 U.S.C.section 360bbb-3(b)(1), unless the authorization is terminated  or revoked sooner.       Influenza A by PCR NEGATIVE NEGATIVE Final   Influenza B by PCR NEGATIVE NEGATIVE Final    Comment: (NOTE) The Xpert Xpress SARS-CoV-2/FLU/RSV plus assay is intended as an aid in the diagnosis of influenza from Nasopharyngeal swab specimens and should not be used as a sole basis for treatment. Nasal washings and aspirates are unacceptable for Xpert Xpress SARS-CoV-2/FLU/RSV testing.  Fact Sheet for Patients: EntrepreneurPulse.com.au  Fact Sheet for Healthcare Providers: IncredibleEmployment.be  This test is not yet approved or cleared by the [place] FDA and has been authorized for detection and/or diagnosis of SARS-CoV-2 by FDA under an Emergency Use Authorization (EUA). This EUA will remain in effect (meaning this test can be used) for the duration of the COVID-19 declaration under Section 564(b)(1) of the Act, 21 U.S.C. section 360bbb-3(b)(1), unless the authorization is terminated or revoked.  Performed at [facility], [address], [place], [place] [address]      Radiology Studies: CT FEMUR RIGHT WO CONTRAST  Result Date: 08/05/2020 CLINICAL DATA:  History of remote fracture with internal fixation. Fall today, pain. EXAM: CT OF THE LOWER RIGHT EXTREMITY WITHOUT CONTRAST  TECHNIQUE: Multidetector CT imaging of the right lower extremity was performed according to the standard protocol. COMPARISON:  Plain films today FINDINGS: Changes of remote right femoral intertrochanteric fracture with internal fixation with hip screw and intramedullary nail. Fracture lines remain evident throughout the intertrochanteric region. This could reflect nonunion although cannot exclude re-injury/re-fracture. There is a nondisplaced acute fracture seen in the proximal shaft along the intramedullary nail. No subluxation or dislocation. IMPRESSION: Extensive comminuted old right intertrochanteric fracture with internal fixation. Fracture lines remain evident and may be related to nonunion although re-fracture cannot be excluded. Acute nondisplaced fracture through the proximal femoral shaft adjacent to the intramedullary nail. Electronically Signed   By: Rolm Baptise M.D.   On: 08/05/2020 22:28   DG Chest Port 1 View  Result Date: 08/07/2020 CLINICAL DATA:  Initial evaluation for CHF. EXAM: PORTABLE CHEST 1 VIEW COMPARISON:  Prior radiograph from 08/05/2020. FINDINGS: Median sternotomy wires underlying CABG markers and valvular prosthesis. Cardiomegaly, stable. Mediastinal silhouette within  normal limits. Lungs mildly hypoinflated. Diffuse vascular and interstitial prominence, compatible with pulmonary interstitial edema, slightly greater within the right lung as compared to the left. Overall, appearance is mildly improved from previous. Right greater than left pleural effusions, also slightly improved. Associated mild bibasilar atelectasis. Few more hazy opacities overlying the right upper lung favored to be related to edema. No definite focal infiltrates. No pneumothorax. Few prominent gaseous loops of bowel noted within the upper abdomen. No acute osseous finding. IMPRESSION: 1. Cardiomegaly with moderate diffuse pulmonary interstitial edema and bilateral pleural effusions, overall mildly improved  from previous. 2. Superimposed hazy opacities overlying the right upper lung, favored to be related to edema. No definite focal infiltrates. Electronically Signed   By: Jeannine Boga M.D.   On: 08/07/2020 05:09   DG Chest Portable 1 View  Result Date: 08/05/2020 CLINICAL DATA:  Hip fracture.  Admission. EXAM: PORTABLE CHEST 1 VIEW COMPARISON:  May 11, 2020 FINDINGS: There is a large layering right pleural effusion with underlying atelectasis. There is a small left pleural effusion. Suspected mild edema. Stable cardiomegaly. The hila and mediastinum are unchanged. No pneumothorax. No other acute abnormalities. IMPRESSION: Cardiomegaly, right greater than left pleural effusions as above, and pulmonary edema. Electronically Signed   By: Dorise Bullion III M.D   On: 08/05/2020 20:52   DG Knee Complete 4 Views Right  Result Date: 08/05/2020 CLINICAL DATA:  Right hip pain.  Recent fall. EXAM: RIGHT KNEE - COMPLETE 4+ VIEW COMPARISON:  None. FINDINGS: The patient is status post right knee replacement. Visualized hardware is in good position. No joint effusion. No fracture identified. Vascular calcifications are noted. IMPRESSION: Right knee replacement. Hardware is in good position. No identified fracture. Electronically Signed   By: Dorise Bullion III M.D   On: 08/05/2020 20:51   DG Hip Unilat W or Wo Pelvis 2-3 Views Right  Result Date: 08/05/2020 CLINICAL DATA:  Right hip pain. EXAM: DG HIP (WITH OR WITHOUT PELVIS) 2-3V RIGHT COMPARISON:  05/11/2020 FINDINGS: Hip screw and intramedullary nail in place across a right femoral intertrochanteric fracture. There is a lucency now extending down into the proximal femoral shaft concerning for acute fracture. No subluxation or dislocation. IMPRESSION: Evidence of remote fracture and internal fixation in the right intertrochanteric region. New lucency noted extending into the proximal femoral shaft adjacent to the intramedullary nail compatible with  acute fracture. Electronically Signed   By: Rolm Baptise M.D.   On: 08/05/2020 20:56    Scheduled Meds: . amLODipine  5 mg Oral Daily  . docusate sodium  100 mg Oral BID  . furosemide  40 mg Intravenous Daily  . irbesartan  150 mg Oral BID  . pravastatin  20 mg Oral QPM  . sodium bicarbonate  1,300 mg Oral BID  . sodium chloride flush  3 mL Intravenous Q12H   Continuous Infusions: . sodium chloride       LOS: 2 days   Time spent: 30 minutes  Lorella Nimrod, MD [facility]  If 7PM-7AM, please contact night-coverage [email or web address]  08/07/2020, 11:46 AM   This record has been created using Systems analyst. Errors have been sought and corrected,but may not always be located. Such creation errors do not reflect on the standard of care.

## 2020-08-08 ENCOUNTER — Inpatient Hospital Stay: Payer: PPO | Admitting: Anesthesiology

## 2020-08-08 ENCOUNTER — Inpatient Hospital Stay: Payer: PPO

## 2020-08-08 ENCOUNTER — Encounter: Payer: Self-pay | Admitting: Internal Medicine

## 2020-08-08 ENCOUNTER — Encounter
Admission: EM | Disposition: A | Discharge status: Home / Self Care | Payer: Self-pay | Source: Home / Self Care | Attending: Internal Medicine

## 2020-08-08 HISTORY — PX: ORIF FEMUR FRACTURE: SHX2119

## 2020-08-08 LAB — BASIC METABOLIC PANEL
Anion gap: 11 (ref 5–15)
BUN: 44 mg/dL — ABNORMAL HIGH (ref 8–23)
CO2: 25 mmol/L (ref 22–32)
Calcium: 8.3 mg/dL — ABNORMAL LOW (ref 8.9–10.3)
Chloride: 93 mmol/L — ABNORMAL LOW (ref 98–111)
Creatinine, Ser: 1.96 mg/dL — ABNORMAL HIGH (ref 0.61–1.24)
GFR, Estimated: 32 mL/min — ABNORMAL LOW (ref >60)
Glucose, Bld: 89 mg/dL (ref 70–99)
Potassium: 4.2 mmol/L (ref 3.5–5.1)
Sodium: 129 mmol/L — ABNORMAL LOW (ref 135–145)

## 2020-08-08 LAB — CBC
HCT: 39.1 % (ref 39.0–52.0)
Hemoglobin: 13.3 g/dL (ref 13.0–17.0)
MCH: 30.6 pg (ref 26.0–34.0)
MCHC: 34 g/dL (ref 30.0–36.0)
MCV: 90.1 fL (ref 80.0–100.0)
Platelets: 247 10*3/uL (ref 150–400)
RBC: 4.34 MIL/uL (ref 4.22–5.81)
RDW: 14.3 % (ref 11.5–15.5)
WBC: 6.3 10*3/uL (ref 4.0–10.5)
nRBC: 0 % (ref 0.0–0.2)

## 2020-08-08 LAB — CREATININE, SERUM
Creatinine, Ser: 1.96 mg/dL — ABNORMAL HIGH (ref 0.61–1.24)
GFR, Estimated: 32 mL/min — ABNORMAL LOW (ref >60)

## 2020-08-08 SURGERY — OPEN REDUCTION INTERNAL FIXATION (ORIF) DISTAL FEMUR FRACTURE
Anesthesia: Spinal | Site: Leg Upper | Laterality: Right

## 2020-08-08 MED ORDER — SODIUM CHLORIDE 0.9 % IV SOLN
INTRAVENOUS | Status: DC | PRN
  Administered 2020-08-08: 40 ug/min via INTRAVENOUS

## 2020-08-08 MED ORDER — FUROSEMIDE 40 MG PO TABS
40.0000 mg | ORAL_TABLET | Freq: Every day | ORAL | Status: DC
  Administered 2020-08-08 – 2020-08-10 (×3): 40 mg via ORAL
  Filled 2020-08-08 (×3): qty 1

## 2020-08-08 MED ORDER — FENTANYL CITRATE (PF) 100 MCG/2ML IJ SOLN
INTRAMUSCULAR | Status: DC | PRN
  Administered 2020-08-08: 25 ug via INTRAVENOUS

## 2020-08-08 MED ORDER — ONDANSETRON HCL 4 MG/2ML IJ SOLN: 4.0000 mg | Freq: Once | INTRAMUSCULAR | Status: DC | PRN

## 2020-08-08 MED ORDER — LIDOCAINE HCL (CARDIAC) PF 100 MG/5ML IV SOSY
PREFILLED_SYRINGE | INTRAVENOUS | Status: DC | PRN
  Administered 2020-08-08: 100 mg via INTRAVENOUS

## 2020-08-08 MED ORDER — METOCLOPRAMIDE HCL 10 MG PO TABS: 5.0000 mg | ORAL_TABLET | Freq: Three times a day (TID) | ORAL | Status: DC | PRN

## 2020-08-08 MED ORDER — SODIUM CHLORIDE 0.9 % IV SOLN: INTRAVENOUS | Status: DC | PRN

## 2020-08-08 MED ORDER — CEFAZOLIN SODIUM-DEXTROSE 2-4 GM/100ML-% IV SOLN
INTRAVENOUS | Status: AC
  Filled 2020-08-08: qty 100

## 2020-08-08 MED ORDER — ONDANSETRON HCL 4 MG/2ML IJ SOLN: 4.0000 mg | Freq: Four times a day (QID) | INTRAMUSCULAR | Status: DC | PRN

## 2020-08-08 MED ORDER — FENTANYL CITRATE (PF) 100 MCG/2ML IJ SOLN: 25.0000 ug | INTRAMUSCULAR | Status: DC | PRN

## 2020-08-08 MED ORDER — BUPIVACAINE HCL (PF) 0.5 % IJ SOLN
INTRAMUSCULAR | Status: DC | PRN
  Administered 2020-08-08: 2.5 mL

## 2020-08-08 MED ORDER — PHENYLEPHRINE HCL (PRESSORS) 10 MG/ML IV SOLN
INTRAVENOUS | Status: DC | PRN
  Administered 2020-08-08 (×2): 100 ug via INTRAVENOUS
  Administered 2020-08-08: 200 ug via INTRAVENOUS

## 2020-08-08 MED ORDER — DOCUSATE SODIUM 100 MG PO CAPS
100.0000 mg | ORAL_CAPSULE | Freq: Two times a day (BID) | ORAL | Status: DC
  Administered 2020-08-08 – 2020-08-10 (×4): 100 mg via ORAL
  Filled 2020-08-08 (×4): qty 1

## 2020-08-08 MED ORDER — PROPOFOL 500 MG/50ML IV EMUL
INTRAVENOUS | Status: DC | PRN
  Administered 2020-08-08: 50 ug/kg/min via INTRAVENOUS

## 2020-08-08 MED ORDER — PROPOFOL 500 MG/50ML IV EMUL
INTRAVENOUS | Status: AC
  Filled 2020-08-08: qty 50

## 2020-08-08 MED ORDER — EPHEDRINE SULFATE 50 MG/ML IJ SOLN
INTRAMUSCULAR | Status: DC | PRN
  Administered 2020-08-08 (×2): 5 mg via INTRAVENOUS

## 2020-08-08 MED ORDER — METOCLOPRAMIDE HCL 5 MG/ML IJ SOLN: 5.0000 mg | Freq: Three times a day (TID) | INTRAMUSCULAR | Status: DC | PRN

## 2020-08-08 MED ORDER — ONDANSETRON HCL 4 MG PO TABS: 4.0000 mg | ORAL_TABLET | Freq: Four times a day (QID) | ORAL | Status: DC | PRN

## 2020-08-08 MED ORDER — PROPOFOL 10 MG/ML IV BOLUS
INTRAVENOUS | Status: DC | PRN
  Administered 2020-08-08 (×3): 10 mg via INTRAVENOUS

## 2020-08-08 MED ORDER — FENTANYL CITRATE (PF) 100 MCG/2ML IJ SOLN
INTRAMUSCULAR | Status: AC
  Filled 2020-08-08: qty 2

## 2020-08-08 MED ORDER — LACTATED RINGERS IV SOLN: INTRAVENOUS | Status: DC | PRN

## 2020-08-08 MED ORDER — ENOXAPARIN SODIUM 30 MG/0.3ML ~~LOC~~ SOLN
30.0000 mg | SUBCUTANEOUS | Status: DC
  Administered 2020-08-09: 30 mg via SUBCUTANEOUS
  Filled 2020-08-08: qty 0.3

## 2020-08-08 SURGICAL SUPPLY — 38 items
BIT DRILL SHORT 4.2 (BIT) IMPLANT
BNDG COHESIVE 4X5 TAN STRL (GAUZE/BANDAGES/DRESSINGS) ×2 IMPLANT
BRUSH SCRUB EZ  4% CHG (MISCELLANEOUS) ×4
BRUSH SCRUB EZ 4% CHG (MISCELLANEOUS) ×2 IMPLANT
CANISTER SUCT 1200ML W/VALVE (MISCELLANEOUS) ×1 IMPLANT
CHLORAPREP W/TINT 26 (MISCELLANEOUS) ×3 IMPLANT
COVER WAND RF STERILE (DRAPES) ×1 IMPLANT
DRAPE 3/4 80X56 (DRAPES) ×3 IMPLANT
DRAPE U-SHAPE 47X51 STRL (DRAPES) ×3 IMPLANT
DRILL BIT SHORT 4.2 (BIT) ×2
DRSG AQUACEL AG ADV 3.5X 4 (GAUZE/BANDAGES/DRESSINGS) ×2 IMPLANT
DRSG AQUACEL AG ADV 3.5X10 (GAUZE/BANDAGES/DRESSINGS) ×1 IMPLANT
ELECT REM PT RETURN 9FT ADLT (ELECTROSURGICAL) ×3
ELECTRODE REM PT RTRN 9FT ADLT (ELECTROSURGICAL) ×1 IMPLANT
GAUZE XEROFORM 1X8 LF (GAUZE/BANDAGES/DRESSINGS) ×3 IMPLANT
GLOVE INDICATOR 8.0 STRL GRN (GLOVE) ×3 IMPLANT
GLOVE SURG ORTHO 8.0 STRL STRW (GLOVE) ×3 IMPLANT
GOWN STRL REUS W/ TWL LRG LVL3 (GOWN DISPOSABLE) ×1 IMPLANT
GOWN STRL REUS W/ TWL XL LVL3 (GOWN DISPOSABLE) ×1 IMPLANT
GOWN STRL REUS W/TWL LRG LVL3 (GOWN DISPOSABLE) ×2
GOWN STRL REUS W/TWL XL LVL3 (GOWN DISPOSABLE) ×2
KIT PATIENT CARE HANA TABLE (KITS) ×3 IMPLANT
KIT TURNOVER CYSTO (KITS) ×3 IMPLANT
MANIFOLD NEPTUNE II (INSTRUMENTS) ×3 IMPLANT
MAT ABSORB  FLUID 56X50 GRAY (MISCELLANEOUS) ×2
MAT ABSORB FLUID 56X50 GRAY (MISCELLANEOUS) ×1 IMPLANT
NDL SPNL 20GX3.5 QUINCKE YW (NEEDLE) ×1 IMPLANT
NEEDLE SPNL 20GX3.5 QUINCKE YW (NEEDLE) ×3 IMPLANT
NS IRRIG 1000ML POUR BTL (IV SOLUTION) ×3 IMPLANT
PACK HIP COMPR (MISCELLANEOUS) ×3 IMPLANT
SCREW LOCK STAR 5X44 (Screw) ×2 IMPLANT
SCREW LOCK STAR 5X54 (Screw) ×2 IMPLANT
STAPLER SKIN PROX 35W (STAPLE) ×3 IMPLANT
SUT VIC AB 0 CT1 36 (SUTURE) ×3 IMPLANT
SUT VIC AB 2-0 CT1 27 (SUTURE) ×2
SUT VIC AB 2-0 CT1 TAPERPNT 27 (SUTURE) ×1 IMPLANT
SYR 30ML LL (SYRINGE) ×3 IMPLANT
TOWEL OR 17X26 4PK STRL BLUE (TOWEL DISPOSABLE) ×3 IMPLANT

## 2020-08-08 NOTE — Op Note (Signed)
DATE OF SURGERY:  08/08/2020  TIME: 3:45 PM  PATIENT NAME:  [name]  AGE: 86 y.o.  PRE-OPERATIVE DIAGNOSIS:  Subtrochanteric femur fracture, right closed  POST-OPERATIVE DIAGNOSIS:  SAME  PROCEDURE:  RIGHT OPEN REDUCTION INTERNAL FIXATION (ORIF) FEMUR FRACTURE  SURGEON:  Lovell Sheehan  EBL:  10 cc  COMPLICATIONS:  None apparent  OPERATIVE IMPLANTS: Synthes locking screws 5.0 mm by 44 mm and 5.0 mm by 54 mm  PREOPERATIVE INDICATIONS:  SALLIE MAKER is a 86 y.o. year old who fell and suffered a hip fracture. He was brought into the ER and then admitted and optimized and then elected for surgical intervention.    The risks benefits and alternatives were discussed with the patient including but not limited to the risks of nonoperative treatment, versus surgical intervention including infection, bleeding, nerve injury, malunion, nonunion, hardware prominence, hardware failure, need for hardware removal, blood clots, cardiopulmonary complications, morbidity, mortality, among others, and they were willing to proceed.    OPERATIVE PROCEDURE:  The patient was brought to the operating room and placed in the supine position.  Spinal anesthesia was administered. He was placed on the fracture table.  Closed reduction was performed under C-arm guidance. The length of the femur was also measured using fluoroscopy. Time out was then performed after sterile prep and drape. He received preoperative antibiotics.  Incision was made along the distal lateral femur. A prior long femoral nail had been placed for intertrochanteric femur fracture.  Using a perfect Holt technique two distal locking screws were placed with good fixation. The final construct moved as a unit.  I then removed the instruments, and took final C-arm pictures AP and lateral. The wounds were irrigated copiously and closed with Vicryl  followed by staples and dry sterile dressing. Sponge and needle count were correct.   The  patient was awakened and returned to PACU in stable and satisfactory condition. There no complications and the patient tolerated the procedure well.  He will be weightbearing as tolerated.    Lovell Sheehan

## 2020-08-08 NOTE — Anesthesia Procedure Notes (Signed)
Procedure Name: MAC Date/Time: 08/08/2020 3:17 PM Performed by: Lily Peer Laurent Cargile, CRNA Pre-anesthesia Checklist: Patient identified Oxygen Delivery Method: Simple face mask

## 2020-08-08 NOTE — Transfer of Care (Signed)
Immediate Anesthesia Transfer of Care Note  Patient: [name]  Procedure(s) Performed: OPEN REDUCTION INTERNAL FIXATION (ORIF) DISTAL FEMUR FRACTURE (Right )  Patient Location: PACU  Anesthesia Type:General  Level of Consciousness: awake, alert  and oriented  Airway & Oxygen Therapy: Patient Spontanous Breathing and Patient connected to face mask oxygen  Post-op Assessment: Report given to RN and Post -op Vital signs reviewed and stable  Post vital signs: Reviewed and unstable  Last Vitals:  Vitals Value Taken Time  BP 99/82 08/08/20 1545  Temp    Pulse 84 08/08/20 1546  Resp 17 08/08/20 1547  SpO2 100 % 08/08/20 1546  Vitals shown include unvalidated device data.  Last Pain:  Vitals:   08/08/20 1131  TempSrc: Oral  PainSc:       Patients Stated Pain Goal: 0 (48/[id number])  Complications: No complications documented.

## 2020-08-08 NOTE — Anesthesia Procedure Notes (Signed)
Spinal  Patient location during procedure: OR Start time: 08/08/2020 2:37 PM End time: 08/08/2020 2:47 PM Staffing Performed: resident/CRNA  Anesthesiologist: Tera Mater, MD Resident/CRNA: Norm Salt, CRNA Preanesthetic Checklist Completed: patient identified, IV checked, site marked, risks and benefits discussed, surgical consent, monitors and equipment checked and pre-op evaluation Spinal Block Patient position: left lateral decubitus Prep: ChloraPrep Patient monitoring: heart rate, continuous pulse ox and blood pressure Approach: midline Location: L3-4 Injection technique: single-shot Needle Needle type: Pencan  Needle gauge: 22 G Needle length: 10 cm

## 2020-08-08 NOTE — Progress Notes (Signed)
Mobility Specialist - Progress Note   08/08/20 1413  Mobility  Activity Off unit  Mobility performed by Mobility specialist    Per discussion with nurse, pt currently OOF for open reduction fixation procedure on femur. Will attempt session at another date/time as pt becomes available.    Kathee Delton Mobility Specialist 08/08/20, 2:15 PM

## 2020-08-08 NOTE — Progress Notes (Signed)
[facility] Encounter Note  Patient: [name] / Admit Date: 08/05/2020 / Date of Encounter: 08/08/2020, 6:53 AM   Subjective: 11/21 patient has felt a significant amount better from yesterday till today with a significant improvements in shortness of breath.  There is been no evidence of chest pain and or PND orthopnea at this time.  There is been no evidence of acute coronary syndrome with a troponin of 62 and 60.  BNP was peaked at 2713 with a chest x-ray showing continued mild pulmonary edema and bilateral small pleural effusions but significantly improved from previous day.  EKG continues to show normal sinus rhythm with left interventricular conduction defect and telemetry is the same.  The patient does still have right leg pain and will need surgical intervention although patient may need slight more medical management for lowest risk with surgical intervention  11/22 patient is significantly improved from admission.  He is breathing much better and feels much more refreshed.  No evidence of need for oxygen supplementation.  The patient has had significant improvements in Rales and deep breathing.  Lower extremity edema almost completely resolved.  Chronic kidney disease unchanged with this med regimen.  Review of Systems: Positive for: Right leg pain Negative for: Vision change, hearing change, syncope, dizziness, nausea, vomiting,diarrhea, bloody stool, stomach pain, cough, congestion, diaphoresis, urinary frequency, urinary pain,skin lesions, skin rashes Others previously listed  Objective: Telemetry: Normal sinus rhythm with bundle branch block Physical Exam: Blood pressure 114/73, pulse 81, temperature 98.2 F (36.8 C), temperature source Oral, resp. rate 19, height 5\' 11"  (1.803 m), weight 76.2 kg, SpO2 95 %. Body mass index is 23.45 kg/m. General: Well developed, well nourished, in no acute distress. Head: Normocephalic, atraumatic, sclera non-icteric,  no xanthomas, nares are without discharge. Neck: No apparent masses Lungs: Normal respirations with no wheezes, no rhonchi, no rales , few crackles   Heart: Regular rate and rhythm, normal S1 S2, 2+ aortic murmur, no rub, no gallop, PMI is normal size and placement, carotid upstroke normal without bruit, jugular venous pressure normal Abdomen: Soft, non-tender, non-distended with normoactive bowel sounds. No hepatosplenomegaly. Abdominal aorta is normal size without bruit Extremities: Trace edema, no clubbing, no cyanosis, no ulcers,  Peripheral: 2+ radial, 2+ femoral, 2+ dorsal pedal pulses Neuro: Alert and oriented. Moves all extremities spontaneously. Psych:  Responds to questions appropriately with a normal affect.   Intake/Output Summary (Last 24 hours) at 08/08/2020 0653 Last data filed at 08/07/2020 2136 Gross per 24 hour  Intake 843 ml  Output 950 ml  Net -107 ml    Inpatient Medications:  . amLODipine  5 mg Oral Daily  . docusate sodium  100 mg Oral BID  . furosemide  40 mg Intravenous Daily  . irbesartan  150 mg Oral BID  . mupirocin ointment  1 application Nasal BID  . pravastatin  20 mg Oral QPM  . sodium bicarbonate  1,300 mg Oral BID  . sodium chloride flush  3 mL Intravenous Q12H   Infusions:  . sodium chloride    .  ceFAZolin (ANCEF) IV      Labs: Recent Labs    08/07/20 0429 08/08/20 0314  NA 130* 129*  K 3.9 4.2  CL 95* 93*  CO2 23 25  GLUCOSE 92 89  BUN 36* 44*  CREATININE 1.74* 1.96*  CALCIUM 8.7* 8.3*   No results for input(s): AST, ALT, ALKPHOS, BILITOT, PROT, ALBUMIN in the last 72 hours. Recent Labs  08/05/20 2018  WBC 6.9  HGB 12.6*  HCT 36.5*  MCV 90.1  PLT 242   No results for input(s): CKTOTAL, CKMB, TROPONINI in the last 72 hours. Invalid input(s): POCBNP No results for input(s): HGBA1C in the last 72 hours.   Weights: Filed Weights   08/06/20 1623 08/07/20 0500 08/08/20 0541  Weight: 78.8 kg 78.6 kg 76.2 kg      Radiology/Studies:  CT FEMUR RIGHT WO CONTRAST  Result Date: 08/05/2020 CLINICAL DATA:  History of remote fracture with internal fixation. Fall today, pain. EXAM: CT OF THE LOWER RIGHT EXTREMITY WITHOUT CONTRAST TECHNIQUE: Multidetector CT imaging of the right lower extremity was performed according to the standard protocol. COMPARISON:  Plain films today FINDINGS: Changes of remote right femoral intertrochanteric fracture with internal fixation with hip screw and intramedullary nail. Fracture lines remain evident throughout the intertrochanteric region. This could reflect nonunion although cannot exclude re-injury/re-fracture. There is a nondisplaced acute fracture seen in the proximal shaft along the intramedullary nail. No subluxation or dislocation. IMPRESSION: Extensive comminuted old right intertrochanteric fracture with internal fixation. Fracture lines remain evident and may be related to nonunion although re-fracture cannot be excluded. Acute nondisplaced fracture through the proximal femoral shaft adjacent to the intramedullary nail. Electronically Signed   By: Rolm Baptise M.D.   On: 08/05/2020 22:28   DG Chest Port 1 View  Result Date: 08/07/2020 CLINICAL DATA:  Initial evaluation for CHF. EXAM: PORTABLE CHEST 1 VIEW COMPARISON:  Prior radiograph from 08/05/2020. FINDINGS: Median sternotomy wires underlying CABG markers and valvular prosthesis. Cardiomegaly, stable. Mediastinal silhouette within normal limits. Lungs mildly hypoinflated. Diffuse vascular and interstitial prominence, compatible with pulmonary interstitial edema, slightly greater within the right lung as compared to the left. Overall, appearance is mildly improved from previous. Right greater than left pleural effusions, also slightly improved. Associated mild bibasilar atelectasis. Few more hazy opacities overlying the right upper lung favored to be related to edema. No definite focal infiltrates. No pneumothorax. Few  prominent gaseous loops of bowel noted within the upper abdomen. No acute osseous finding. IMPRESSION: 1. Cardiomegaly with moderate diffuse pulmonary interstitial edema and bilateral pleural effusions, overall mildly improved from previous. 2. Superimposed hazy opacities overlying the right upper lung, favored to be related to edema. No definite focal infiltrates. Electronically Signed   By: Jeannine Boga M.D.   On: 08/07/2020 05:09   DG Chest Portable 1 View  Result Date: 08/05/2020 CLINICAL DATA:  Hip fracture.  Admission. EXAM: PORTABLE CHEST 1 VIEW COMPARISON:  May 11, 2020 FINDINGS: There is a large layering right pleural effusion with underlying atelectasis. There is a small left pleural effusion. Suspected mild edema. Stable cardiomegaly. The hila and mediastinum are unchanged. No pneumothorax. No other acute abnormalities. IMPRESSION: Cardiomegaly, right greater than left pleural effusions as above, and pulmonary edema. Electronically Signed   By: Dorise Bullion III M.D   On: 08/05/2020 20:52   DG Knee Complete 4 Views Right  Result Date: 08/05/2020 CLINICAL DATA:  Right hip pain.  Recent fall. EXAM: RIGHT KNEE - COMPLETE 4+ VIEW COMPARISON:  None. FINDINGS: The patient is status post right knee replacement. Visualized hardware is in good position. No joint effusion. No fracture identified. Vascular calcifications are noted. IMPRESSION: Right knee replacement. Hardware is in good position. No identified fracture. Electronically Signed   By: Dorise Bullion III M.D   On: 08/05/2020 20:51   DG Hip Unilat W or Wo Pelvis 2-3 Views Right  Result Date: 08/05/2020 CLINICAL DATA:  Right hip pain. EXAM: DG HIP (WITH OR WITHOUT PELVIS) 2-3V RIGHT COMPARISON:  05/11/2020 FINDINGS: Hip screw and intramedullary nail in place across a right femoral intertrochanteric fracture. There is a lucency now extending down into the proximal femoral shaft concerning for acute fracture. No subluxation or  dislocation. IMPRESSION: Evidence of remote fracture and internal fixation in the right intertrochanteric region. New lucency noted extending into the proximal femoral shaft adjacent to the intramedullary nail compatible with acute fracture. Electronically Signed   By: Rolm Baptise M.D.   On: 08/05/2020 20:56     Assessment and Recommendation  86 y.o. male with known coronary artery disease status post coronary artery bypass graft and aortic valve replacement hypertension hyperlipidemia chronic kidney disease stage III with acute right leg pain needing further surgical intervention but also systolic dysfunction congestive heart failure with previous history of inferior hypokinesis with ejection fraction of 40% and recent stress test showing no evidence of ischemia but infarct in the inferior wall improving with symptoms of heart failure and no current evidence of acute coronary syndrome  Acute on chronic systolic dysfunction congestive heart failure The patient has had essential resolution of acute on chronic systolic dysfunction heart failure symptoms with 2 days of diuresis and now good oxygenation and resolution of lower extremity edema.  Coronary artery disease with aortic valve replacement No further evidence of myocardial infarction and/or acute coronary syndrome with stability of coronary artery disease at this time  Plan 1.  Continuation of furosemide at this time but will change to oral medication management at 40 mg p.o. daily 2.  No change in hypertension medication management including amlodipine ibesartan 3.  Continuation of medication management for high intensity cholesterol therapy including pravastatin 4.  No further cardiac intervention or diagnostics necessary at this time 5.  Proceed to orthopedic surgery today for fix of femur fracture because patient is at lowest risk possible at this time and optimized for surgery.  No restrictions at all to postoperative care and/or  rehabilitation  Signed, Serafina Royals M.D. FACC

## 2020-08-08 NOTE — Anesthesia Preprocedure Evaluation (Addendum)
Anesthesia Evaluation  Patient identified by MRN, date of birth, ID band Patient awake    Reviewed: Allergy & Precautions, H&P , NPO status , Patient's Chart, lab work & pertinent test results  History of Anesthesia Complications Negative for: history of anesthetic complications  Airway Mallampati: II  TM Distance: >3 FB     Dental  (+) Missing   Pulmonary neg sleep apnea, neg COPD, former smoker,    breath sounds clear to auscultation       Cardiovascular hypertension, (-) angina+ CAD, + CABG and +CHF  (-) Past MI and (-) Cardiac Stents (-) dysrhythmias + Valvular Problems/Murmurs (s/p AVR)  Rhythm:regular Rate:Normal  Echo 04/18/20: MILD SEGMENTAL LV SYSTOLIC DYSFUNCTION WITH AN ESTIMATED EF = 40-45 %  MILD RV SYSTOLIC DYSFUNCTION MODERATE TRICUSPID VALVE INSUFFICIENCY  MILD-TO-MODERATE MITRAL VALVE INSUFFICIENCY  TRACE AORTIC VALVE INSUFFICIENCY  NO VALVULAR STENOSIS  MODERATE RV ENLARGEMENT  MODERATE RA ENLARGEMENT  MILD LV ENLARGEMENT  MILD LA ENLARGEMENT  MILDLY DILATED AORTIC ROOT MEASURING UP TO 4.0 cm  NORMAL BIOPROSTHETIC AORTIC VALVE FUNCTION    NM stress 04/18/20: Moderately decreased perfusion inferior wall consistent with prior infarct   Neuro/Psych negative neurological ROS  negative psych ROS   GI/Hepatic Neg liver ROS, GERD  ,  Endo/Other  negative endocrine ROS  Renal/GU Renal disease (CKD)     Musculoskeletal   Abdominal   Peds  Hematology  (+) Blood dyscrasia, anemia , Hgb 12.6   Anesthesia Other Findings Elevated BNP on admission.  Has been diuresed and is currently medically optimized for surgery per Cardiology  Past Medical History: No date: Anemia No date: Aortic aneurysm ([place]) No date: Aortic aneurysm ([facility]) No date: Arthritis No date: CKD (chronic kidney disease)     Comment:  ALSO LEFT RENAL MASS No date: Coronary artery disease No date: Diverticulosis     Comment:  WITH  RUPTURE No date: GERD (gastroesophageal reflux disease) No date: History of hiatal hernia No date: Hypercholesteremia No date: Hypertension No date: MGUS (monoclonal gammopathy of unknown significance) No date: MGUS (monoclonal gammopathy of unknown significance)  Past Surgical History: No date: AORTIC VALVE REPLACEMENT No date: bipass No date: CARDIAC VALVE REPLACEMENT     Comment:  pig valve No date: CHOLECYSTECTOMY No date: COLON SURGERY No date: CORONARY ANGIOPLASTY No date: CORONARY ARTERY BYPASS GRAFT 02/07/2015: ESOPHAGOGASTRODUODENOSCOPY (EGD) WITH PROPOFOL; N/A     Comment:  Procedure: ESOPHAGOGASTRODUODENOSCOPY (EGD) WITH               PROPOFOL;  Surgeon: Hulen Luster, MD;  Location: [facility]               ENDOSCOPY;  Service: Gastroenterology;  Laterality: N/A; No date: EYE SURGERY No date: HERNIA REPAIR 05/12/2020: INTRAMEDULLARY (IM) NAIL INTERTROCHANTERIC; Right     Comment:  Procedure: INTRAMEDULLARY (IM) NAIL INTERTROCHANTRIC;                Surgeon: Lovell Sheehan, MD;  Location: [facility] ORS;                Service: Orthopedics;  Laterality: Right; No date: JOINT REPLACEMENT     Comment:  left knee x4 04/02/2016: KNEE ARTHROPLASTY; Right     Comment:  Procedure: COMPUTER ASSISTED TOTAL KNEE ARTHROPLASTY;                Surgeon: Dereck Leep, MD;  Location: [facility] ORS;                Service: Orthopedics;  Laterality: Right;  BMI    Body Mass Index: 23.45 kg/m      Reproductive/Obstetrics negative OB ROS                            Anesthesia Physical Anesthesia Plan  ASA: III  Anesthesia Plan: Spinal   Post-op Pain Management:    Induction:   PONV Risk Score and Plan: Propofol infusion and Ondansetron  Airway Management Planned: Simple Face Mask  Additional Equipment:   Intra-op Plan:   Post-operative Plan:   Informed Consent: I have reviewed the patients History and Physical, chart, labs and discussed the procedure including  the risks, benefits and alternatives for the proposed anesthesia with the patient or authorized representative who has indicated his/her understanding and acceptance.     Dental Advisory Given  Plan Discussed with: Anesthesiologist, CRNA and Surgeon  Anesthesia Plan Comments:        Anesthesia Quick Evaluation

## 2020-08-08 NOTE — Anesthesia Postprocedure Evaluation (Signed)
Anesthesia Post Note  Patient: DINARI STGERMAINE  Procedure(s) Performed: OPEN REDUCTION INTERNAL FIXATION (ORIF) DISTAL FEMUR FRACTURE (Right Leg Upper)  Patient location during evaluation: PACU Anesthesia Type: Spinal Level of consciousness: oriented and awake and alert Pain management: pain level controlled Vital Signs Assessment: post-procedure vital signs reviewed and stable Respiratory status: spontaneous breathing, respiratory function stable and patient connected to nasal cannula oxygen Cardiovascular status: blood pressure returned to baseline and stable Postop Assessment: no headache, no backache and no apparent nausea or vomiting Anesthetic complications: no   No complications documented.   Last Vitals:  Vitals:   08/08/20 1629 08/08/20 1630  BP: 114/80   Pulse: 91   Resp: 13   Temp:    SpO2: 96% 96%    Last Pain:  Vitals:   08/08/20 1543  TempSrc:   PainSc: 0-No pain                 Arita Miss

## 2020-08-08 NOTE — Progress Notes (Signed)
PHARMACIST - PHYSICIAN COMMUNICATION  CONCERNING:  Enoxaparin (Lovenox) for DVT Prophylaxis    RECOMMENDATION: Patient was prescribed enoxaparin 40mg  q24 hours for VTE prophylaxis.   Filed Weights   08/06/20 1623 08/07/20 0500 08/08/20 0541  Weight: 78.8 kg (173 lb 11.2 oz) 78.6 kg (173 lb 3.2 oz) 76.2 kg (168 lb 1.6 oz)    Body mass index is 23.45 kg/m.  Estimated Creatinine Clearance: 28.3 mL/min (A) (by C-G formula based on SCr of 1.96 mg/dL (H)).  Patient is candidate for enoxaparin 30mg  every 24 hours based on CrCl <56ml/min or Weight <45kg  DESCRIPTION: Pharmacy has adjusted enoxaparin dose per [facility] policy.  Patient is now receiving enoxaparin 30 mg every 24 hours    Benita Gutter 08/08/2020 4:53 PM

## 2020-08-08 NOTE — Care Management Important Message (Signed)
Important Message  Patient Details  Name: ASPEN DETERDING MRN: [id number] Date of Birth: 1931/01/04   Medicare Important Message Given:  Yes     Dannette Barbara 08/08/2020, 12:23 PM

## 2020-08-08 NOTE — Progress Notes (Signed)
PROGRESS NOTE    [name]  RUE:[id number] DOB: [birth year] DOA: 08/05/2020 PCP: Baxter Hire, MD   Brief Narrative: Taken from H&P. [name] is a 86 y.o. male with medical history significant for stage III CKD, hypertension, dyslipidemia, anemia who presents to the ER for evaluation of pain in his right knee. Patient is s/p IM nailing for right hip fracture in August, 2021. He had a mechanical fall 2 days prior to this admission and presents with complaints of severe pain in the medial aspect of his right knee. He also endorsed some exertional dyspnea, orthopnea and PND. Had about 6 to 8 pound weight gain despite taking his home meds.  Found to have elevated BNP at 2713, troponin 62>>68, chest x-ray with bilateral pleural effusion, right greater than left and pulmonary edema, right lower extremity imaging with periprosthetic fracture of femur. He was started on IV diuresis, cardiology and orthopedic was consulted. Cardiology wants little more diuresis and surgery was planned for Monday.  Subjective: Patient has no new complaint today.  Waiting for his procedure by orthopedic today.  He was little irritable stating that he do not want to see that many providers.  He just want his cardiologist and orthopedic.  Tried explaining the role of hospitalist services, at the end he agrees to talk with me. According to patient he will not go to any SNF and would like to go back home on discharge.  Assessment & Plan:   Principal Problem:   CHF (congestive heart failure) (HCC) Active Problems:   Benign essential HTN   Cardiomyopathy, ischemic   Chronic kidney disease   Periprosthetic fracture around internal prosthetic hip joint   Hyponatremia   Pressure injury of skin  Acute on chronic systolic heart failure. Patient symptoms and labs are concerning for acute on chronic systolic heart failure.  BNP elevated at 2713 and echocardiogram done in August 2021 with EF of 40 to 45%, multi  valvular disease and a functional bioprosthetic aortic valve. Cardiology was consulted and they are recommending diuresis before proceeding for any surgery, they are recommending ideally tomorrow. There is another increase in his creatinine .  Appears euvolemic. -Cardiology was also consulted-appreciate their help. -Discontinue IV Lasix and start him on p.o. -Daily BMP. -Daily weight. -Strict intake and output. -Continue home meds.  Right periprosthetic fracture of femur. Secondary to mechanical fall. Orthopedic was consulted and they are recommending intramedullary nail placement.  Going for procedure by orthopedic today -Continue with pain management. -Follow-up postoperative recommendations. -Postoperative physical therapy.  Elevated troponin. Patient has an history of CAD. No chest pain. Most likely secondary to demand ischemia with acute on chronic HFrEF. -Per cardiology there is no need for further investigation. -Holding home dose of aspirin for anticipated surgery in a day-will be resumed after surgery. -Continue statin  AKI with CKD stage IIIa. Most likely with volume up secondary to acute on chronic systolic failure. Creatinine at 1.94, mild increase from yesterday, baseline around 1.3-1.4. -Decreasing the dose of Lasix-discontinue IV and will start p.o. from tomorrow. -Continue to monitor with diuresis. -Avoid nephrotoxins.  Hyponatremia.  Mild decrease in sodium to 129 today. -Continue to monitor.  Hypertension. Blood pressure within goal. -Continue home meds.  Objective: Vitals:   08/08/20 0541 08/08/20 0730 08/08/20 1131 08/08/20 1405  BP:  114/77 111/74 124/80  Pulse:  69 87 80  Resp:  14 17 18   Temp:  98.2 F (36.8 C) 97.9 F (36.6 C) 98.2 F (36.8 C)  TempSrc:  Oral Oral   SpO2:  96% 97% 96%  Weight: 76.2 kg     Height:        Intake/Output Summary (Last 24 hours) at 08/08/2020 1527 Last data filed at 08/07/2020 2136 Gross per 24 hour  Intake 603 ml   Output 950 ml  Net -347 ml   Filed Weights   08/06/20 1623 08/07/20 0500 08/08/20 0541  Weight: 78.8 kg 78.6 kg 76.2 kg    Examination:  General.  Well-developed elderly man, in no acute distress. Pulmonary.  Lungs clear bilaterally, normal respiratory effort. CV.  Regular rate and rhythm, no JVD, rub or murmur. Abdomen.  Soft, nontender, nondistended, BS positive. CNS.  Alert and oriented x3.  No focal neurologic deficit. Extremities.  No edema, no cyanosis, pulses intact and symmetrical. Psychiatry.  Judgment and insight appears normal.  DVT prophylaxis: SCDs. Code Status: Full Family Communication: Discussed with patient Disposition Plan:  Status is: Inpatient  Remains inpatient appropriate because:Inpatient level of care appropriate due to severity of illness   Dispo: The patient is from: Home              Anticipated d/c is to: To be determined              Anticipated d/c date is: 1-2 days              Patient currently is not medically stable to d/c.   Consultants:   Orthopedic  Cardiology  Procedures:  Antimicrobials:   Data Reviewed: I have personally reviewed following labs and imaging studies  CBC: Recent Labs  Lab 08/05/20 2018  WBC 6.9  HGB 12.6*  HCT 36.5*  MCV 90.1  PLT 563   Basic Metabolic Panel: Recent Labs  Lab 08/05/20 2018 08/06/20 0500 08/07/20 0429 08/08/20 0314  NA 128* 128* 130* 129*  K 4.6 4.8 3.9 4.2  CL 97* 98 95* 93*  CO2 21* 19* 23 25  GLUCOSE 122* 116* 92 89  BUN 37* 37* 36* 44*  CREATININE 1.72* 1.68* 1.74* 1.96*  CALCIUM 8.7* 8.7* 8.7* 8.3*   GFR: Estimated Creatinine Clearance: 28.3 mL/min (A) (by C-G formula based on SCr of 1.96 mg/dL (H)). Liver Function Tests: No results for input(s): AST, ALT, ALKPHOS, BILITOT, PROT, ALBUMIN in the last 168 hours. No results for input(s): LIPASE, AMYLASE in the last 168 hours. No results for input(s): AMMONIA in the last 168 hours. Coagulation Profile: No results for  input(s): INR, PROTIME in the last 168 hours. Cardiac Enzymes: No results for input(s): CKTOTAL, CKMB, CKMBINDEX, TROPONINI in the last 168 hours. BNP (last 3 results) No results for input(s): PROBNP in the last 8760 hours. HbA1C: No results for input(s): HGBA1C in the last 72 hours. CBG: No results for input(s): GLUCAP in the last 168 hours. Lipid Profile: No results for input(s): CHOL, HDL, LDLCALC, TRIG, CHOLHDL, LDLDIRECT in the last 72 hours. Thyroid Function Tests: No results for input(s): TSH, T4TOTAL, FREET4, T3FREE, THYROIDAB in the last 72 hours. Anemia Panel: No results for input(s): VITAMINB12, FOLATE, FERRITIN, TIBC, IRON, RETICCTPCT in the last 72 hours. Sepsis Labs: No results for input(s): PROCALCITON, LATICACIDVEN in the last 168 hours.  Recent Results (from the past 240 hour(s))  Resp Panel by RT-PCR (Flu A&B, Covid) Nasopharyngeal Swab     Status: None   Collection Time: 08/06/20 12:38 AM   Specimen: Nasopharyngeal Swab; Nasopharyngeal(NP) swabs in vial transport medium  Result Value Ref Range Status   SARS Coronavirus 2 by  RT PCR NEGATIVE NEGATIVE Final    Comment: (NOTE) SARS-CoV-2 target nucleic acids are NOT DETECTED.  The SARS-CoV-2 RNA is generally detectable in upper respiratory specimens during the acute phase of infection. The lowest concentration of SARS-CoV-2 viral copies this assay can detect is 138 copies/mL. A negative result does not preclude SARS-Cov-2 infection and should not be used as the sole basis for treatment or other patient management decisions. A negative result may occur with  improper specimen collection/handling, submission of specimen other than nasopharyngeal swab, presence of viral mutation(s) within the areas targeted by this assay, and inadequate number of viral copies(<138 copies/mL). A negative result must be combined with clinical observations, patient history, and epidemiological information. The expected result is  Negative.  Fact Sheet for Patients:  EntrepreneurPulse.com.au  Fact Sheet for Healthcare Providers:  IncredibleEmployment.be  This test is no t yet approved or cleared by the [place] FDA and  has been authorized for detection and/or diagnosis of SARS-CoV-2 by FDA under an Emergency Use Authorization (EUA). This EUA will remain  in effect (meaning this test can be used) for the duration of the COVID-19 declaration under Section 564(b)(1) of the Act, 21 U.S.C.section 360bbb-3(b)(1), unless the authorization is terminated  or revoked sooner.       Influenza A by PCR NEGATIVE NEGATIVE Final   Influenza B by PCR NEGATIVE NEGATIVE Final    Comment: (NOTE) The Xpert Xpress SARS-CoV-2/FLU/RSV plus assay is intended as an aid in the diagnosis of influenza from Nasopharyngeal swab specimens and should not be used as a sole basis for treatment. Nasal washings and aspirates are unacceptable for Xpert Xpress SARS-CoV-2/FLU/RSV testing.  Fact Sheet for Patients: EntrepreneurPulse.com.au  Fact Sheet for Healthcare Providers: IncredibleEmployment.be  This test is not yet approved or cleared by the [place] FDA and has been authorized for detection and/or diagnosis of SARS-CoV-2 by FDA under an Emergency Use Authorization (EUA). This EUA will remain in effect (meaning this test can be used) for the duration of the COVID-19 declaration under Section 564(b)(1) of the Act, 21 U.S.C. section 360bbb-3(b)(1), unless the authorization is terminated or revoked.  Performed at [facility], [address], [place], [place] [address]   Surgical PCR screen     Status: None   Collection Time: 08/07/20  8:51 PM   Specimen: Nasal Mucosa; Nasal Swab  Result Value Ref Range Status   MRSA, PCR NEGATIVE NEGATIVE Final   Staphylococcus aureus NEGATIVE NEGATIVE Final    Comment: (NOTE) The Xpert SA Assay (FDA  approved for NASAL specimens in patients 21 years of age and older), is one component of a comprehensive surveillance program. It is not intended to diagnose infection nor to guide or monitor treatment. Performed at [facility], [address] [place], [place] [address]      Radiology Studies: [facility] Chest [place] 1 View  Result Date: 08/07/2020 CLINICAL DATA:  Initial evaluation for CHF. EXAM: PORTABLE CHEST 1 VIEW COMPARISON:  Prior radiograph from 08/05/2020. FINDINGS: Median sternotomy wires underlying CABG markers and valvular prosthesis. Cardiomegaly, stable. Mediastinal silhouette within normal limits. Lungs mildly hypoinflated. Diffuse vascular and interstitial prominence, compatible with pulmonary interstitial edema, slightly greater within the right lung as compared to the left. Overall, appearance is mildly improved from previous. Right greater than left pleural effusions, also slightly improved. Associated mild bibasilar atelectasis. Few more hazy opacities overlying the right upper lung favored to be related to edema. No definite focal infiltrates. No pneumothorax. Few prominent gaseous loops of  bowel noted within the upper abdomen. No acute osseous finding. IMPRESSION: 1. Cardiomegaly with moderate diffuse pulmonary interstitial edema and bilateral pleural effusions, overall mildly improved from previous. 2. Superimposed hazy opacities overlying the right upper lung, favored to be related to edema. No definite focal infiltrates. Electronically Signed   By: Jeannine Boga M.D.   On: 08/07/2020 05:09    Scheduled Meds: . [MAR Hold] amLODipine  5 mg Oral Daily  . [MAR Hold] docusate sodium  100 mg Oral BID  . [MAR Hold] furosemide  40 mg Oral Daily  . [MAR Hold] irbesartan  150 mg Oral BID  . [MAR Hold] mupirocin ointment  1 application Nasal BID  . [MAR Hold] pravastatin  20 mg Oral QPM  . [MAR Hold] sodium bicarbonate  1,300 mg Oral BID  . [MAR Hold] sodium chloride  flush  3 mL Intravenous Q12H   Continuous Infusions: . [MAR Hold] sodium chloride       LOS: 3 days   Time spent: 30 minutes  Lorella Nimrod, MD [facility]  If 7PM-7AM, please contact night-coverage [email or web address]  08/08/2020, 3:27 PM   This record has been created using Systems analyst. Errors have been sought and corrected,but may not always be located. Such creation errors do not reflect on the standard of care.

## 2020-08-08 NOTE — H&P (Signed)
The patient has been re-examined, and the chart reviewed, and there have been no interval changes to the documented history and physical.  Plan a right femur fracture repair today.  Anesthesia is not consulted regarding a peripheral nerve block for post-operative pain.  The risks, benefits, and alternatives have been discussed at length, and the patient is willing to proceed.

## 2020-08-08 NOTE — Progress Notes (Signed)
Pt arrived back to unit from OR He appears to be in no apparent distress.  Pt denies any pain Pt has decreased sensation in lower extremities. Pulses palpable. VSS Assessment completed as charted.  Daughter at bedside and updated on plan of care.  Time allowed for questions and concerns.  Denies any additional wants or needs at this time Call bell within reach Will continue to closely monitor.

## 2020-08-08 NOTE — Plan of Care (Signed)

## 2020-08-09 ENCOUNTER — Encounter: Payer: Self-pay | Admitting: Orthopedic Surgery

## 2020-08-09 LAB — URINALYSIS, COMPLETE (UACMP) WITH MICROSCOPIC
Bacteria, UA: NONE SEEN
Bilirubin Urine: NEGATIVE
Glucose, UA: NEGATIVE mg/dL
Ketones, ur: NEGATIVE mg/dL
Leukocytes,Ua: NEGATIVE
Nitrite: NEGATIVE
Protein, ur: 30 mg/dL — AB
Specific Gravity, Urine: 1.012 (ref 1.005–1.030)
pH: 6 (ref 5.0–8.0)

## 2020-08-09 LAB — CBC
HCT: 35.6 % — ABNORMAL LOW (ref 39.0–52.0)
Hemoglobin: 12.7 g/dL — ABNORMAL LOW (ref 13.0–17.0)
MCH: 31 pg (ref 26.0–34.0)
MCHC: 35.7 g/dL (ref 30.0–36.0)
MCV: 86.8 fL (ref 80.0–100.0)
Platelets: 235 10*3/uL (ref 150–400)
RBC: 4.1 MIL/uL — ABNORMAL LOW (ref 4.22–5.81)
RDW: 14.1 % (ref 11.5–15.5)
WBC: 7.3 10*3/uL (ref 4.0–10.5)
nRBC: 0 % (ref 0.0–0.2)

## 2020-08-09 LAB — BASIC METABOLIC PANEL
Anion gap: 12 (ref 5–15)
BUN: 45 mg/dL — ABNORMAL HIGH (ref 8–23)
CO2: 24 mmol/L (ref 22–32)
Calcium: 8.6 mg/dL — ABNORMAL LOW (ref 8.9–10.3)
Chloride: 94 mmol/L — ABNORMAL LOW (ref 98–111)
Creatinine, Ser: 1.9 mg/dL — ABNORMAL HIGH (ref 0.61–1.24)
GFR, Estimated: 34 mL/min — ABNORMAL LOW (ref >60)
Glucose, Bld: 90 mg/dL (ref 70–99)
Potassium: 4.3 mmol/L (ref 3.5–5.1)
Sodium: 130 mmol/L — ABNORMAL LOW (ref 135–145)

## 2020-08-09 MED ORDER — POLYETHYLENE GLYCOL 3350 17 G PO PACK
17.0000 g | PACK | Freq: Once | ORAL | Status: AC
  Administered 2020-08-09: 17 g via ORAL
  Filled 2020-08-09: qty 1

## 2020-08-09 NOTE — Evaluation (Addendum)
Physical Therapy Evaluation Patient Details Name: [name] MRN: [id number] DOB: 1930/12/30 Today's Date: 08/09/2020   History of Present Illness  86 yo male with fall on 11/18 with R subtrochanteric femur fx now s/p ORIF  Clinical Impression  The pt presents this session with low reports of pain and good tolerance for mobility. He requires use of elevated bed height and RW for safe completion of sit<>stand transfer. He also reports Mod I with supine<>sit transfer at baseline with use of strap to mobilize RLE. The pt tolerates bed mobility and gait this session with no significant increase in pain. The pt scores 18/24 on the [facility] indicating 46.58% impairment at this time. He will require continued skilled PT in order to optimize functional mobility and to address balance and bed mobility concerns.     Follow Up Recommendations Home health PT;Supervision - Intermittent    Equipment Recommendations    Pt has no further skilled DME needs   Recommendations for Other Services       Precautions / Restrictions Precautions Precautions: Fall Restrictions Weight Bearing Restrictions: Yes RLE Weight Bearing: Weight bearing as tolerated      Mobility  Bed Mobility Overal bed mobility: Needs Assistance Bed Mobility: Supine to Sit     Supine to sit: Min assist     General bed mobility comments: HOB elevated. Assist for management of RLE. Use of bed railings    Transfers Overall transfer level: Needs assistance Equipment used: Rolling walker (2 wheeled) Transfers: Sit to/from Stand Sit to Stand: Min guard         General transfer comment: Verbal cues for hand placement and forward translation.  Ambulation/Gait Ambulation/Gait assistance: Min assist Gait Distance (Feet): 120 Feet Assistive device: Rolling walker (2 wheeled) Gait Pattern/deviations: Step-through pattern Gait velocity: decreased gait speeds; formal gait speed not assessed.      Stairs             Wheelchair Mobility    Modified Rankin (Stroke Patients Only)       Balance Overall balance assessment: Mild deficits observed, not formally tested                                           Pertinent Vitals/Pain Pain Assessment: 0-10 Pain Score: 2  Pain Location: R knee Pain Descriptors / Indicators: Sore Pain Intervention(s): Ice applied;RN gave pain meds during session    Home Living Family/patient expects to be discharged to:: Private residence Living Arrangements: Alone Available Help at Discharge: Family;Available PRN/intermittently Type of Home: House Home Access: Ramped entrance     Home Layout: One level Home Equipment: Walker - 2 wheels;Grab bars - tub/shower;Shower seat;Walker - 4 wheels Additional Comments: Lift Chair    Prior Function Level of Independence: Independent with assistive device(s)         Comments: Prior to admission pt was at SNF and participated in stationary bike and community distance ambulation.     Hand Dominance        Extremity/Trunk Assessment   Upper Extremity Assessment Upper Extremity Assessment: Overall WFL for tasks assessed    Lower Extremity Assessment Lower Extremity Assessment: RLE deficits/detail;LLE deficits/detail RLE Deficits / Details: s/p ORIF LLE Deficits / Details: WFL       Communication   Communication: No difficulties  Cognition Arousal/Alertness: Awake/alert Behavior During Therapy: WFL for tasks assessed/performed Overall Cognitive Status: Within Functional  Limits for tasks assessed                                        General Comments      Exercises Other Exercises Other Exercises: Standing Marching x20   Assessment/Plan    PT Assessment Patient needs continued PT services  PT Problem List Decreased mobility;Decreased strength;Decreased skin integrity       PT Treatment Interventions Therapeutic exercise;Gait training;Balance  training;Therapeutic activities;Functional mobility training    PT Goals (Current goals can be found in the Care Plan section)  Acute Rehab PT Goals Patient Stated Goal: To go home PT Goal Formulation: With patient Time For Goal Achievement: 08/23/20 Potential to Achieve Goals: Good    Frequency BID   Barriers to discharge Decreased caregiver support Pt lives alone and will only have access to intermittent assistance.    Co-evaluation               AM-PAC PT "6 Clicks" Mobility  Outcome Measure Help needed turning from your back to your side while in a flat bed without using bedrails?: A Little Help needed moving from lying on your back to sitting on the side of a flat bed without using bedrails?: A Little Help needed moving to and from a bed to a chair (including a wheelchair)?: A Little Help needed standing up from a chair using your arms (e.g., wheelchair or bedside chair)?: A Little Help needed to walk in hospital room?: A Little Help needed climbing 3-5 steps with a railing? : A Little 6 Click Score: 18    End of Session Equipment Utilized During Treatment: Gait belt Activity Tolerance: Patient tolerated treatment well Patient left: in chair;with chair alarm set;with call bell/phone within reach   PT Visit Diagnosis: Unsteadiness on feet (R26.81);History of falling (Z91.81);Repeated falls (R29.6);Other abnormalities of gait and mobility (R26.89)    Time: 6381-7711 PT Time Calculation (min) (ACUTE ONLY): 53 min   Charges:   PT Evaluation $PT Eval Low Complexity: 1 Low PT Treatments $Gait Training: 23-37 mins $Therapeutic Exercise: 8-22 mins       11:06 AM, 08/09/20 Idabell Picking A. Saverio Danker PT, DPT Physical Therapist - [facility]   Larin Weissberg A Khole Branch 08/09/2020, 11:06 AM

## 2020-08-09 NOTE — Progress Notes (Signed)
  Subjective:  Patient reports pain as mild.    Objective:   VITALS:   Vitals:   08/09/20 0811 08/09/20 1126 08/09/20 1558 08/09/20 1632  BP: 106/69 100/78  107/64  Pulse: 87 81 87 78  Resp: 16 16  15   Temp: 98.4 F (36.9 C) 98.3 F (36.8 C)  98.3 F (36.8 C)  TempSrc: Oral Oral  Oral  SpO2: 96% 95% 93% 96%  Weight:      Height:        PHYSICAL EXAM:  Sensation intact distally Dorsiflexion/Plantar flexion intact Incision: dressing C/D/I No cellulitis present Compartment soft  LABS  Results for orders placed or performed during the hospital encounter of 08/05/20 (from the past 24 hour(s))  Basic metabolic panel     Status: Abnormal   Collection Time: 08/09/20  5:25 AM  Result Value Ref Range   Sodium 130 (L) 135 - 145 mmol/L   Potassium 4.3 3.5 - 5.1 mmol/L   Chloride 94 (L) 98 - 111 mmol/L   CO2 24 22 - 32 mmol/L   Glucose, Bld 90 70 - 99 mg/dL   BUN 45 (H) 8 - 23 mg/dL   Creatinine, Ser 1.90 (H) 0.61 - 1.24 mg/dL   Calcium 8.6 (L) 8.9 - 10.3 mg/dL   GFR, Estimated 34 (L) >60 mL/min   Anion gap 12 5 - 15  CBC     Status: Abnormal   Collection Time: 08/09/20  5:25 AM  Result Value Ref Range   WBC 7.3 4.0 - 10.5 K/uL   RBC 4.10 (L) 4.22 - 5.81 MIL/uL   Hemoglobin 12.7 (L) 13.0 - 17.0 g/dL   HCT 35.6 (L) 39 - 52 %   MCV 86.8 80.0 - 100.0 fL   MCH 31.0 26.0 - 34.0 pg   MCHC 35.7 30.0 - 36.0 g/dL   RDW 14.1 11.5 - 15.5 %   Platelets 235 150 - 400 K/uL   nRBC 0.0 0.0 - 0.2 %    DG HIP OPERATIVE UNILAT W OR W/O PELVIS RIGHT  Result Date: 08/08/2020 CLINICAL DATA:  ORIF EXAM: OPERATIVE right HIP (WITH PELVIS IF PERFORMED) 2 VIEWS TECHNIQUE: Fluoroscopic spot image(s) were submitted for interpretation post-operatively. COMPARISON:  08/05/2020 FINDINGS: Two views of the distal femur show an intramedullary nail with 2 distal locking screws. Components appear well position. Prior total knee replacement. Mid and proximal femur were not included. IMPRESSION: Good  appearance following ORIF for femur fracture, as seen. Intramedullary nail with 2 distal locking screws. Electronically Signed   By: Nelson Chimes M.D.   On: 08/08/2020 16:15    Assessment/Plan: 1 Day Post-Op   Principal Problem:   CHF (congestive heart failure) ([place]) Active Problems:   Benign essential HTN   Cardiomyopathy, ischemic   Chronic kidney disease   Periprosthetic fracture around internal prosthetic hip joint   Hyponatremia   Pressure injury of skin   Up with therapy Discharge home with home health versus outpatient PT RTC 12 to 14 days for staple removal   Lovell Sheehan , MD 08/09/2020, 5:29 PM

## 2020-08-09 NOTE — Progress Notes (Signed)
[facility] Cardiology    SUBJECTIVE: Patient feels reasonably well sitting up in chair shortness of breath is much improved no chest pain   Vitals:   08/09/20 0049 08/09/20 0439 08/09/20 0500 08/09/20 0811  BP: 123/82 101/66  106/69  Pulse: 79 81  87  Resp: 16 15  16   Temp: 98.2 F (36.8 C) 97.9 F (36.6 C)  98.4 F (36.9 C)  TempSrc:    Oral  SpO2: 92% 92%  96%  Weight:   76 kg   Height:         Intake/Output Summary (Last 24 hours) at 08/09/2020 1104 Last data filed at 08/09/2020 1009 Gross per 24 hour  Intake 1143 ml  Output 1410 ml  Net -267 ml      PHYSICAL EXAM  General: Well developed, well nourished, in no acute distress HEENT:  Normocephalic and atramatic Neck:  No JVD.  Lungs: Clear bilaterally to auscultation and percussion. Heart: HRRR . Normal S1 and S2 without gallops or murmurs.  Abdomen: Bowel sounds are positive, abdomen soft and non-tender  Msk:  Back normal, normal gait. Normal strength and tone for age. Extremities: No clubbing, cyanosis or edema.   Neuro: Alert and oriented X 3. Psych:  Good affect, responds appropriately   LABS: Basic Metabolic Panel: Recent Labs    08/08/20 0314 08/08/20 0314 08/08/20 1701 08/09/20 0525  NA 129*  --   --  130*  K 4.2  --   --  4.3  CL 93*  --   --  94*  CO2 25  --   --  24  GLUCOSE 89  --   --  90  BUN 44*  --   --  45*  CREATININE 1.96*   < > 1.96* 1.90*  CALCIUM 8.3*  --   --  8.6*   < > = values in this interval not displayed.   Liver Function Tests: No results for input(s): AST, ALT, ALKPHOS, BILITOT, PROT, ALBUMIN in the last 72 hours. No results for input(s): LIPASE, AMYLASE in the last 72 hours. CBC: Recent Labs    08/08/20 1701 08/09/20 0525  WBC 6.3 7.3  HGB 13.3 12.7*  HCT 39.1 35.6*  MCV 90.1 86.8  PLT 247 235   Cardiac Enzymes: No results for input(s): CKTOTAL, CKMB, CKMBINDEX, TROPONINI in the last 72 hours. BNP: Invalid input(s): POCBNP D-Dimer: No results for input(s):  DDIMER in the last 72 hours. Hemoglobin A1C: No results for input(s): HGBA1C in the last 72 hours. Fasting Lipid Panel: No results for input(s): CHOL, HDL, LDLCALC, TRIG, CHOLHDL, LDLDIRECT in the last 72 hours. Thyroid Function Tests: No results for input(s): TSH, T4TOTAL, T3FREE, THYROIDAB in the last 72 hours.  Invalid input(s): FREET3 Anemia Panel: No results for input(s): VITAMINB12, FOLATE, FERRITIN, TIBC, IRON, RETICCTPCT in the last 72 hours.  DG HIP OPERATIVE UNILAT W OR W/O PELVIS RIGHT  Result Date: 08/08/2020 CLINICAL DATA:  ORIF EXAM: OPERATIVE right HIP (WITH PELVIS IF PERFORMED) 2 VIEWS TECHNIQUE: Fluoroscopic spot image(s) were submitted for interpretation post-operatively. COMPARISON:  08/05/2020 FINDINGS: Two views of the distal femur show an intramedullary nail with 2 distal locking screws. Components appear well position. Prior total knee replacement. Mid and proximal femur were not included. IMPRESSION: Good appearance following ORIF for femur fracture, as seen. Intramedullary nail with 2 distal locking screws. Electronically Signed   By: Nelson Chimes M.D.   On: 08/08/2020 16:15       TELEMETRY: Sinus rhythm nonspecific findings  ASSESSMENT AND PLAN:  Principal Problem:   CHF (congestive heart failure) (HCC) Active Problems:   Benign essential HTN   Cardiomyopathy, ischemic   Chronic kidney disease   Periprosthetic fracture around internal prosthetic hip joint   Hyponatremia   Pressure injury of skin    Plan Continue conservative management for heart failure Refer the patient to heart failure clinic as outpatient Follow-up with nephrology for renal insufficiency Continue hypertension management control    Yolonda Kida, MD 08/09/2020 11:04 AM

## 2020-08-09 NOTE — TOC Initial Note (Signed)
Transition of Care [facility]) - Initial/Assessment Note    Patient Details  Name: [name] MRN: [id number] Date of Birth: 1930/09/20  Transition of Care [facility]) CM/SW Contact:    Shelbie Ammons, RN Phone Number: 08/09/2020, 3:56 PM  Clinical Narrative:   RNCM met with patient in room. Patient lives at home normally independently. He suffered a fall resulting in right femur fracture requiring fixation. Patient sitting up in bed reports to feeling better, much better than he did with his previous hospitalization. Patient reports that he would like to continue on with the OPPT he was already doing. RNCM reached out to [facility] and they already have patient on schedule for Monday. Patient can keep that appointment and they will do another eval at that time.                Expected Discharge Plan: Home/Self Care Barriers to Discharge: No Barriers Identified   Patient Goals and CMS Choice        Expected Discharge Plan and Services Expected Discharge Plan: Home/Self Care                                              Prior Living Arrangements/Services   Lives with:: Self Patient language and need for interpreter reviewed:: Yes Do you feel safe going back to the place where you live?: Yes      Need for Family Participation in Patient Care: Yes (Comment) Care giver support system in place?: Yes (comment)   Criminal Activity/Legal Involvement Pertinent to Current Situation/Hospitalization: No - Comment as needed  Activities of Daily Living Home Assistive Devices/Equipment: Eyeglasses, Hearing aid, Walker (specify type) ADL Screening (condition at time of admission) Patient's cognitive ability adequate to safely complete daily activities?: Yes Is the patient deaf or have difficulty hearing?: No Does the patient have difficulty seeing, even when wearing glasses/contacts?: No Does the patient have difficulty concentrating, remembering, or making decisions?:  No Patient able to express need for assistance with ADLs?: Yes Does the patient have difficulty dressing or bathing?: No Independently performs ADLs?: Yes (appropriate for developmental age) Does the patient have difficulty walking or climbing stairs?: Yes Weakness of Legs: Both Weakness of Arms/Hands: None  Permission Sought/Granted                  Emotional Assessment Appearance:: Appears stated age     Orientation: : Oriented to Self, Oriented to Place, Oriented to  Time, Oriented to Situation Alcohol / Substance Use: Not Applicable Psych Involvement: No (comment)  Admission diagnosis:  CHF (congestive heart failure) (HCC) [I50.9] Pleural effusion [J90] Acute CHF (congestive heart failure) ([place]) [I50.9] Periprosthetic fracture around internal prosthetic right hip joint, initial encounter ([place]) [O16.01XA] History of total right knee replacement [Z96.651] Acute pain of right knee [M25.561] Patient Active Problem List   Diagnosis Date Noted  . Pressure injury of skin 08/07/2020  . CHF (congestive heart failure) ([place]) 08/05/2020  . Periprosthetic fracture around internal prosthetic hip joint 08/05/2020  . Hyponatremia 08/05/2020  . Constipation 05/15/2020  . AKI (acute kidney injury) ([place]) 05/15/2020  . Hip fracture ([place]) 05/11/2020  . MGUS (monoclonal gammopathy of unknown significance) 05/23/2016  . S/P total knee arthroplasty 04/02/2016  . Chronic kidney disease 11/22/2015  . CAD in native artery 11/22/2015  . CKD (chronic kidney disease) stage 3, GFR 30-59 ml/min (HCC) 06/30/2015  .  TI (tricuspid incompetence) 05/11/2015  . Early satiety 01/25/2015  . Benign essential HTN 01/03/2015  . Abdominal aortic aneurysm (AAA) without rupture ([place]) 07/09/2014  . MI (mitral incompetence) 07/09/2014  . Aortic heart valve narrowing 07/09/2014  . Cardiomyopathy, ischemic 06/28/2014  . Arthritis of knee, degenerative 05/17/2014  . Kidney lump 04/09/2013  . Benign prostatic  hyperplasia with urinary obstruction 03/17/2013  . Chronic kidney disease, stage IV (severe) ([place]) 03/17/2013  . 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

## 2020-08-09 NOTE — Evaluation (Signed)
Occupational Therapy Evaluation Patient Details Name: [name] MRN: [id number] DOB: 06-01-1931 Today's Date: 08/09/2020    History of Present Illness [name] is a 86 y.o. male with medical history significant for stage III CKD, hypertension, dyslipidemia, anemia, and IM nailing for R hip fx in August, 2021 who presents to the ER for evaluation of pain in his right knee after a mechanical fall ~2 days prior to presentation. Immging revealed "New lucency noted extending into the proximal femoral shaft adjacent to the intramedullary nail compatible with acute fracture". Pt is now s/p RLE ORIF.   Clinical Impression   Pt seen for OT evaluation this date, POD#1 from above surgery. Pt reports he had recently returned to near baseline level of functional independence was independent in all ADLs prior to surgery. He states he had gotten back to cooking for himself, dressing, and bathing independently. He demonstrates excellent carryover of past education from OT services. Pt is eager to return to PLOF with less pain and improved safety and independence. Pt instructed in falls prevention strategies, home/routines modifications, DME/AE for LB bathing and dressing tasks. Pt return verbalizes understanding of education provided. He return demonstrates safe transfer techniques using 2WW during session. Pt states pain is minimal and he is eager to DC home to get back to his regular routines. All treatment/education completed at time of initial OT evaluation. Will DC in house. Do not currently anticipate any OT needs following this hospitalization.      Follow Up Recommendations  No OT follow up;Follow surgeon's recommendation for DC plan and follow-up therapies    Equipment Recommendations  None recommended by OT (Pt has necessary equipment.)    Recommendations for Other Services       Precautions / Restrictions Precautions Precautions: Fall Restrictions Weight Bearing Restrictions:  Yes RLE Weight Bearing: Weight bearing as tolerated      Mobility Bed Mobility Overal bed mobility: Needs Assistance Bed Mobility: Supine to Sit     Supine to sit: Min assist     General bed mobility comments: Deferred. Pt up in recliner at start/end of session.    Transfers Overall transfer level: Needs assistance Equipment used: Rolling walker (2 wheeled) Transfers: Sit to/from Stand Sit to Stand: Supervision         General transfer comment: Pt performs STS from recliner w/o physical assist from therapist. He return demonstrates safe hand/foot placement and sequencing during session.    Balance Overall balance assessment: Needs assistance Sitting-balance support: No upper extremity supported;Feet supported Sitting balance-Leahy Scale: Good Sitting balance - Comments: Steady static/dynamic sitting, reaching outside BOS.   Standing balance support: During functional activity;No upper extremity supported Standing balance-Leahy Scale: Good Standing balance comment: Steady static standing w/o UE support.                           ADL either performed or assessed with clinical judgement   ADL Overall ADL's : At baseline                                       General ADL Comments: Pt presents at or near baseline level for functional independence during ADL management. He endorses he is able to safely reach his feet and demonstrates safe functional mobility during session. Pt familiar with OT from past admission/sx. He endorses having a LH reacher at home and feels  confident with LB dressing and bathing skills. Pt eduated on use of shower chair and long handle sponge for bathing tasks upon DC home. Falls prevention strategies reviewed with pt, and he return verbalizes understanding of all education proved.     Vision Baseline Vision/History: Wears glasses Wears Glasses: At all times Patient Visual Report: No change from baseline        Perception     Praxis      Pertinent Vitals/Pain Pain Assessment: 0-10 Pain Score: 2  Pain Location: R knee; wtih mobility Pain Descriptors / Indicators: Sore Pain Intervention(s): Limited activity within patient's tolerance;Monitored during session;Repositioned     Hand Dominance Right   Extremity/Trunk Assessment Upper Extremity Assessment Upper Extremity Assessment: Overall WFL for tasks assessed   Lower Extremity Assessment Lower Extremity Assessment: RLE deficits/detail;Defer to PT evaluation RLE Deficits / Details: s/p ORIF LLE Deficits / Details: WFL       Communication Communication Communication: No difficulties   Cognition Arousal/Alertness: Awake/alert Behavior During Therapy: WFL for tasks assessed/performed Overall Cognitive Status: Within Functional Limits for tasks assessed                                     General Comments       Exercises Other Exercises Other Exercises: Standing Marching x20 Other Exercises: Pt educated on role of OT in acute setting, safe use of AE/DME for ADL management, falls prevention strategies, and routines modifications to support safety and functional independence upon hospital DC. Pt return verbalizes understanding of education provided. He demonstrates good carryover of education provided during past admissions.   Shoulder Instructions      Home Living Family/patient expects to be discharged to:: Private residence Living Arrangements: Alone Available Help at Discharge: Family;Available PRN/intermittently Type of Home: House Home Access: Ramped entrance     Home Layout: One level     Bathroom Shower/Tub: Occupational psychologist: Standard (c riser) Bathroom Accessibility: Yes How Accessible: Accessible via walker Home Equipment: Walker - 2 wheels;Grab bars - tub/shower;Shower seat;Walker - 4 wheels;Adaptive equipment Adaptive Equipment: Reacher;Long-handled sponge Additional Comments:  Lift Chair      Prior Functioning/Environment Level of Independence: Independent with assistive device(s)        Comments: Pt endorses he had gotten back to near baseline level of independence prior to admission. He had recently returned home following a 2 week STR stay. He states he had gotten back to independently dressing, cooking, and doing his own laundry.        OT Problem List: Pain;Decreased activity tolerance;Decreased safety awareness;Impaired balance (sitting and/or standing);Decreased knowledge of use of DME or AE;Decreased coordination      OT Treatment/Interventions:      OT Goals(Current goals can be found in the care plan section) Acute Rehab OT Goals Patient Stated Goal: To go home OT Goal Formulation: All assessment and education complete, DC therapy Time For Goal Achievement: 08/09/20 Potential to Achieve Goals: Good  OT Frequency:     Barriers to D/C:            Co-evaluation              AM-PAC OT "6 Clicks" Daily Activity     Outcome Measure Help from another person eating meals?: None Help from another person taking care of personal grooming?: None Help from another person toileting, which includes using toliet, bedpan, or urinal?: A Little Help from  another person bathing (including washing, rinsing, drying)?: A Little Help from another person to put on and taking off regular upper body clothing?: None Help from another person to put on and taking off regular lower body clothing?: None 6 Click Score: 22   End of Session Equipment Utilized During Treatment: Rolling walker  Activity Tolerance: Patient tolerated treatment well Patient left: in chair;with call bell/phone within reach;with chair alarm set  OT Visit Diagnosis: Other abnormalities of gait and mobility (R26.89);Pain Pain - Right/Left: Right Pain - part of body: Hip;Knee                Time: 1315-1340 OT Time Calculation (min): 25 min Charges:  OT General Charges $OT Visit: 1  Visit OT Evaluation $OT Eval Low Complexity: 1 Low OT Treatments $Self Care/Home Management : 8-22 mins  Shara Blazing, M.S., OTR/L Ascom: [phone] 08/09/20, 2:14 PM

## 2020-08-09 NOTE — Progress Notes (Signed)
Physical Therapy Treatment Patient Details Name: [name] MRN: [id number] DOB: 1930-12-23 Today's Date: 08/09/2020    History of Present Illness [name] is a 86 y.o. male with medical history significant for stage III CKD, hypertension, dyslipidemia, anemia, and IM nailing for R hip fx in Auguts, 2021 who presents to the ER for evaluation of pain in his right knee after a mechanical fall ~2 days prior to presentation. Immging revealed "New lucency noted extending into the proximal femoral shaft adjacent to the intramedullary nail compatible with acute fracture". Pt is now S/p ORIF.    PT Comments    Pt upright in bedside chair upon entry. He presents with no pain and willingness to participate with PT. The pt is able to tolerate increased gait distances of >200' this session. He continues to present with gait deviations that increase his fall risk. He also presents with limited gait speed also increasing his risk for falls. At this time the pt continues to demonstrate ability to progress to d/c home with intermittent family care and HHPT once medically stable. PT will continue to follow.    Follow Up Recommendations  Home health PT;Supervision - Intermittent     Equipment Recommendations       Recommendations for Other Services       Precautions / Restrictions Precautions Precautions: Fall Restrictions Weight Bearing Restrictions: Yes RLE Weight Bearing: Weight bearing as tolerated    Mobility  Bed Mobility Overal bed mobility: Needs Assistance             General bed mobility comments: Deferred. Pt up in recliner at start/end of session.  Transfers Overall transfer level: Needs assistance Equipment used: Rolling walker (2 wheeled) Transfers: Sit to/from Stand Sit to Stand: Min guard         General transfer comment: sit<>stand from recliner with verbal cues for hand placement.  Ambulation/Gait Ambulation/Gait assistance: Min guard;Min assist (Pt  initially presenting with step to pattern and requiring Min guard assist. With verbal cues for continuous step through pattern the pt requires Min A for balance and demonstrates trendelenburg pattern.) Gait Distance (Feet): 220 Feet Assistive device: Rolling walker (2 wheeled) Gait Pattern/deviations: Step-through pattern Gait velocity: 0.39 Gait velocity interpretation: <1.31 ft/sec, indicative of household Metallurgist Rankin (Stroke Patients Only)       Balance Overall balance assessment: Needs assistance Sitting-balance support: No upper extremity supported;Feet supported Sitting balance-Leahy Scale: Good Sitting balance - Comments: Steady static/dynamic sitting, reaching outside BOS.                              Cognition Arousal/Alertness: Awake/alert Behavior During Therapy: WFL for tasks assessed/performed Overall Cognitive Status: Within Functional Limits for tasks assessed                                        Exercises    General Comments        Pertinent Vitals/Pain     Home Living Family/patient expects to be discharged to:: Private residence Living Arrangements: Alone Available Help at Discharge: Family;Available PRN/intermittently Type of Home: House Home Access: Ramped entrance   Home Layout: One level Home Equipment: Walker - 2 wheels;Grab bars - tub/shower;Shower seat;Walker - 4 wheels;Adaptive  equipment Additional Comments: Lift Chair    Prior Function Level of Independence: Independent with assistive device(s)      Comments: Pt endorses he had gotten back to near baseline level of independence prior to admission. He had recently returned home following a 2 week STR stay. He states he had gotten back to independently dressing, cooking, and doing his own laundry.   PT Goals (current goals can now be found in the care plan section) Acute Rehab PT  Goals Patient Stated Goal: To go home PT Goal Formulation: With patient Time For Goal Achievement: 08/23/20 Potential to Achieve Goals: Good Progress towards PT goals: Progressing toward goals    Frequency    BID      PT Plan Current plan remains appropriate    Co-evaluation              AM-PAC PT "6 Clicks" Mobility   Outcome Measure  Help needed turning from your back to your side while in a flat bed without using bedrails?: A Little Help needed moving from lying on your back to sitting on the side of a flat bed without using bedrails?: A Little Help needed moving to and from a bed to a chair (including a wheelchair)?: A Little Help needed standing up from a chair using your arms (e.g., wheelchair or bedside chair)?: A Little Help needed to walk in hospital room?: A Little Help needed climbing 3-5 steps with a railing? : A Little 6 Click Score: 18    End of Session Equipment Utilized During Treatment: Gait belt Activity Tolerance: Patient tolerated treatment well Patient left: in chair;with chair alarm set;with call bell/phone within reach;with nursing/sitter in room Nurse Communication: Mobility status PT Visit Diagnosis: Unsteadiness on feet (R26.81);History of falling (Z91.81);Repeated falls (R29.6);Other abnormalities of gait and mobility (R26.89)     Time: 2902-1115 PT Time Calculation (min) (ACUTE ONLY): 20 min  Charges:  $Gait Training: 8-22 mins                     4:08 PM, 08/09/20 Usher Hedberg A. Saverio Danker PT, DPT Physical Therapist - [facility]    Stanton Kissoon A Areta Terwilliger 08/09/2020, 4:05 PM

## 2020-08-09 NOTE — Progress Notes (Signed)
PROGRESS NOTE    [name]  HRC:[id number] DOB: April 05, 1931 DOA: 08/05/2020 PCP: Baxter Hire, MD   Brief Narrative: Taken from H&P. [name] is a 86 y.o. male with medical history significant for stage III CKD, hypertension, dyslipidemia, anemia who presents to the ER for evaluation of pain in his right knee. Patient is s/p IM nailing for right hip fracture in August, 2021. He had a mechanical fall 2 days prior to this admission and presents with complaints of severe pain in the medial aspect of his right knee. He also endorsed some exertional dyspnea, orthopnea and PND. Had about 6 to 8 pound weight gain despite taking his home meds.  Found to have elevated BNP at 2713, troponin 62>>68, chest x-ray with bilateral pleural effusion, right greater than left and pulmonary edema, right lower extremity imaging with periprosthetic fracture of femur. He was started on IV diuresis, cardiology and orthopedic was consulted. Cardiology wants little more diuresis and surgery was planned for Monday. Had  ORIF yesterday evening-tolerated the procedure well.  Subjective: Patient was again very upset when seen today, stating that he wants to find out who ordered to move him from postoperative care to his room in the middle of night.  Also stating that he will talk with his lawyer that he is not happy with this care as he was supposed to spend the whole night in postoperative care unit.  Tried explaining how hospital move patients depending on their stability after procedure but he does not want to listen this time. Later paged by nursing staff that patient developed some hematuria after removing Foley catheter.  Assessment & Plan:   Principal Problem:   CHF (congestive heart failure) (HCC) Active Problems:   Benign essential HTN   Cardiomyopathy, ischemic   Chronic kidney disease   Periprosthetic fracture around internal prosthetic hip joint   Hyponatremia   Pressure injury of  skin  Acute on chronic systolic heart failure. Patient symptoms and labs are concerning for acute on chronic systolic heart failure.  BNP elevated at 2713 and echocardiogram done in August 2021 with EF of 40 to 45%, multi valvular disease and a functional bioprosthetic aortic valve. Cardiology was consulted and they are recommending diuresis before proceeding for any surgery, they are recommending ideally tomorrow. There is another increase in his creatinine .  Appears euvolemic. -Cardiology was also consulted-appreciate their help. -Continue with p.o. Lasix. -Daily BMP. -Daily weight. -Strict intake and output. -Continue home meds.  Right periprosthetic fracture of femur S/P ORIF on 08/08/20.  Secondary to mechanical fall. Orthopedic was consulted and they are recommending intramedullary nail placement.  Underwent successful ORIF yesterday. -Continue with pain management. -Postoperative physical therapy is recommending home health services.  Hematuria.  Patient developed hematuria after removing Foley catheter, most likely traumatic. -We will check UA and urine culture at daughter's request as she was very concerned about UTI. -Monitor hemoglobin.  Elevated troponin. Patient has an history of CAD. No chest pain. Most likely secondary to demand ischemia with acute on chronic HFrEF. -Per cardiology there is no need for further investigation. -Holding home dose of aspirin for anticipated surgery in a day-will be resumed after surgery. -Continue statin  AKI with CKD stage IIIa. Most likely with volume up secondary to acute on chronic systolic failure. Creatinine at 1.90, baseline around 1.3-1.4. -Continue to monitor with diuresis. -Avoid nephrotoxins.  Hyponatremia.  Sodium at 130 today. -Continue to monitor.  Hypertension. Blood pressure within goal. -Continue home meds.  Objective: Vitals:   08/09/20 0811 08/09/20 1126 08/09/20 1558 08/09/20 1632  BP: 106/69 100/78  107/64   Pulse: 87 81 87 78  Resp: 16 16  15   Temp: 98.4 F (36.9 C) 98.3 F (36.8 C)  98.3 F (36.8 C)  TempSrc: Oral Oral  Oral  SpO2: 96% 95% 93% 96%  Weight:      Height:        Intake/Output Summary (Last 24 hours) at 08/09/2020 1637 Last data filed at 08/09/2020 1405 Gross per 24 hour  Intake 843 ml  Output 800 ml  Net 43 ml   Filed Weights   08/07/20 0500 08/08/20 0541 08/09/20 0500  Weight: 78.6 kg 76.2 kg 76 kg    Examination:  General.,  Well-developed, angry elderly man, in no acute distress. Pulmonary.  Lungs clear bilaterally, normal respiratory effort. CV.  Regular rate and rhythm, no JVD, rub or murmur. Abdomen.  Soft, nontender, nondistended, BS positive. CNS.  Alert and oriented x3.  No focal neurologic deficit. Extremities.  No edema, no cyanosis, pulses intact and symmetrical. Psychiatry.  Judgment and insight appears normal.  DVT prophylaxis: SCDs. Code Status: Full Family Communication: Discussed with patient and daughter. Disposition Plan:  Status is: Inpatient  Remains inpatient appropriate because:Inpatient level of care appropriate due to severity of illness   Dispo: The patient is from: Home              Anticipated d/c is to: To be determined              Anticipated d/c date is: 1-2 days              Patient currently is not medically stable to d/c.   Consultants:   Orthopedic  Cardiology  Procedures:  Antimicrobials:   Data Reviewed: I have personally reviewed following labs and imaging studies  CBC: Recent Labs  Lab 08/05/20 2018 08/08/20 1701 08/09/20 0525  WBC 6.9 6.3 7.3  HGB 12.6* 13.3 12.7*  HCT 36.5* 39.1 35.6*  MCV 90.1 90.1 86.8  PLT 242 247 924   Basic Metabolic Panel: Recent Labs  Lab 08/05/20 2018 08/05/20 2018 08/06/20 0500 08/07/20 0429 08/08/20 0314 08/08/20 1701 08/09/20 0525  NA 128*  --  128* 130* 129*  --  130*  K 4.6  --  4.8 3.9 4.2  --  4.3  CL 97*  --  98 95* 93*  --  94*  CO2 21*  --   19* 23 25  --  24  GLUCOSE 122*  --  116* 92 89  --  90  BUN 37*  --  37* 36* 44*  --  45*  CREATININE 1.72*   < > 1.68* 1.74* 1.96* 1.96* 1.90*  CALCIUM 8.7*  --  8.7* 8.7* 8.3*  --  8.6*   < > = values in this interval not displayed.   GFR: Estimated Creatinine Clearance: 29.2 mL/min (A) (by C-G formula based on SCr of 1.9 mg/dL (H)). Liver Function Tests: No results for input(s): AST, ALT, ALKPHOS, BILITOT, PROT, ALBUMIN in the last 168 hours. No results for input(s): LIPASE, AMYLASE in the last 168 hours. No results for input(s): AMMONIA in the last 168 hours. Coagulation Profile: No results for input(s): INR, PROTIME in the last 168 hours. Cardiac Enzymes: No results for input(s): CKTOTAL, CKMB, CKMBINDEX, TROPONINI in the last 168 hours. BNP (last 3 results) No results for input(s): PROBNP in the last 8760 hours. HbA1C: No results for input(s): HGBA1C  in the last 72 hours. CBG: No results for input(s): GLUCAP in the last 168 hours. Lipid Profile: No results for input(s): CHOL, HDL, LDLCALC, TRIG, CHOLHDL, LDLDIRECT in the last 72 hours. Thyroid Function Tests: No results for input(s): TSH, T4TOTAL, FREET4, T3FREE, THYROIDAB in the last 72 hours. Anemia Panel: No results for input(s): VITAMINB12, FOLATE, FERRITIN, TIBC, IRON, RETICCTPCT in the last 72 hours. Sepsis Labs: No results for input(s): PROCALCITON, LATICACIDVEN in the last 168 hours.  Recent Results (from the past 240 hour(s))  Resp Panel by RT-PCR (Flu A&B, Covid) Nasopharyngeal Swab     Status: None   Collection Time: 08/06/20 12:38 AM   Specimen: Nasopharyngeal Swab; Nasopharyngeal(NP) swabs in vial transport medium  Result Value Ref Range Status   SARS Coronavirus 2 by RT PCR NEGATIVE NEGATIVE Final    Comment: (NOTE) SARS-CoV-2 target nucleic acids are NOT DETECTED.  The SARS-CoV-2 RNA is generally detectable in upper respiratory specimens during the acute phase of infection. The lowest concentration of  SARS-CoV-2 viral copies this assay can detect is 138 copies/mL. A negative result does not preclude SARS-Cov-2 infection and should not be used as the sole basis for treatment or other patient management decisions. A negative result may occur with  improper specimen collection/handling, submission of specimen other than nasopharyngeal swab, presence of viral mutation(s) within the areas targeted by this assay, and inadequate number of viral copies(<138 copies/mL). A negative result must be combined with clinical observations, patient history, and epidemiological information. The expected result is Negative.  Fact Sheet for Patients:  EntrepreneurPulse.com.au  Fact Sheet for Healthcare Providers:  IncredibleEmployment.be  This test is no t yet approved or cleared by the [place] FDA and  has been authorized for detection and/or diagnosis of SARS-CoV-2 by FDA under an Emergency Use Authorization (EUA). This EUA will remain  in effect (meaning this test can be used) for the duration of the COVID-19 declaration under Section 564(b)(1) of the Act, 21 U.S.C.section 360bbb-3(b)(1), unless the authorization is terminated  or revoked sooner.       Influenza A by PCR NEGATIVE NEGATIVE Final   Influenza B by PCR NEGATIVE NEGATIVE Final    Comment: (NOTE) The Xpert Xpress SARS-CoV-2/FLU/RSV plus assay is intended as an aid in the diagnosis of influenza from Nasopharyngeal swab specimens and should not be used as a sole basis for treatment. Nasal washings and aspirates are unacceptable for Xpert Xpress SARS-CoV-2/FLU/RSV testing.  Fact Sheet for Patients: EntrepreneurPulse.com.au  Fact Sheet for Healthcare Providers: IncredibleEmployment.be  This test is not yet approved or cleared by the [place] FDA and has been authorized for detection and/or diagnosis of SARS-CoV-2 by FDA under an Emergency Use  Authorization (EUA). This EUA will remain in effect (meaning this test can be used) for the duration of the COVID-19 declaration under Section 564(b)(1) of the Act, 21 U.S.C. section 360bbb-3(b)(1), unless the authorization is terminated or revoked.  Performed at [facility], [address] [place], [place] [address]   Surgical PCR screen     Status: None   Collection Time: 08/07/20  8:51 PM   Specimen: Nasal Mucosa; Nasal Swab  Result Value Ref Range Status   MRSA, PCR NEGATIVE NEGATIVE Final   Staphylococcus aureus NEGATIVE NEGATIVE Final    Comment: (NOTE) The Xpert SA Assay (FDA approved for NASAL specimens in patients 77 years of age and older), is one component of a comprehensive surveillance program. It is not intended to diagnose infection nor to guide or monitor  treatment. Performed at [facility], [address] [place], [place] [address]      Radiology Studies: DG HIP OPERATIVE [facility] OR W/O PELVIS RIGHT  Result Date: 08/08/2020 CLINICAL DATA:  ORIF EXAM: OPERATIVE right HIP (WITH PELVIS IF PERFORMED) 2 VIEWS TECHNIQUE: Fluoroscopic spot image(s) were submitted for interpretation post-operatively. COMPARISON:  08/05/2020 FINDINGS: Two views of the distal femur show an intramedullary nail with 2 distal locking screws. Components appear well position. Prior total knee replacement. Mid and proximal femur were not included. IMPRESSION: Good appearance following ORIF for femur fracture, as seen. Intramedullary nail with 2 distal locking screws. Electronically Signed   By: Nelson Chimes M.D.   On: 08/08/2020 16:15    Scheduled Meds: . amLODipine  5 mg Oral Daily  . docusate sodium  100 mg Oral BID  . enoxaparin (LOVENOX) injection  30 mg Subcutaneous Q24H  . furosemide  40 mg Oral Daily  . irbesartan  150 mg Oral BID  . mupirocin ointment  1 application Nasal BID  . pravastatin  20 mg Oral QPM  . sodium bicarbonate  1,300 mg Oral BID  . sodium  chloride flush  3 mL Intravenous Q12H   Continuous Infusions: . sodium chloride       LOS: 4 days   Time spent: 30 minutes  Lorella Nimrod, MD [facility]  If 7PM-7AM, please contact night-coverage [email or web address]  08/09/2020, 4:37 PM   This record has been created using Systems analyst. Errors have been sought and corrected,but may not always be located. Such creation errors do not reflect on the standard of care.

## 2020-08-09 NOTE — Progress Notes (Signed)
   Heart Failure Nurse Navigator Note  HFmrEF 40%   He presented to the  ED after a fall, experiencing pain. Found to be in heart failure.  CXR revealed pulmonary edema, small pleural effusions.  Co morbidities:  Coronary artery disease/CABGAVR Hypertension Hyperlipidemia CKD stage III Anemia  Medications:  Norvasc 5 mg daily Lasix 40 mg daily Irbesartan 150 mg BID Pravachol 20 mg daily  Allergies: Coreg-dizziness and syncope Lisinopril- cough   Labs:  Sodium 130, potassium 4.3,  BUN 45 (44), creatinine 1.90 (1.96). BNP on admission 2,713.  CXR pulmonary edema  Weight 73 kg on admission weight 81.6 BMI 23.37 BP 100/78 Intake 783 ml Output 1210 ml   Assessment:  General- he is awake and alert, sitting up in chair at bedside.  No acute distress.  HEENT- pupils equal, good dentition.  Cardiac- heart tones regular, no murmur.  Chest- clear to posterior auscultation  Abdomen- soft non tender  Musculoskeletal- no lower extremity edema.  Psych is pleasant and appropriate, very talkative.  Neuro- speech is clear.      Discussed heart failure and explained the function of his heart is mildly reduced at 40%.  Discussed salt and sodium in diet, he admits to using small amount of salt on salt and when he grills a steak.  Very rarely eats canned soups.  States he fluid intake is approximately 8-8oz cups daily.  He does weigh himself daily and states he had reported to his PCP that he was gradually gaining weight, he normally weighs 165#, admission weight at the hospital was 175.3  Instructed on follow up at the heart failure clinic with Otila Kluver, also explained that she would be another resource to contact if he were gaining weight etc.  He was given heart failure teaching booklet and zone magnet- explained magnet and what to report.   Pricilla Riffle RN, CHFN

## 2020-08-10 LAB — CBC
HCT: 34.3 % — ABNORMAL LOW (ref 39.0–52.0)
Hemoglobin: 12.3 g/dL — ABNORMAL LOW (ref 13.0–17.0)
MCH: 31.2 pg (ref 26.0–34.0)
MCHC: 35.9 g/dL (ref 30.0–36.0)
MCV: 87.1 fL (ref 80.0–100.0)
Platelets: 217 10*3/uL (ref 150–400)
RBC: 3.94 MIL/uL — ABNORMAL LOW (ref 4.22–5.81)
RDW: 14.1 % (ref 11.5–15.5)
WBC: 5.8 10*3/uL (ref 4.0–10.5)
nRBC: 0 % (ref 0.0–0.2)

## 2020-08-10 LAB — BASIC METABOLIC PANEL
Anion gap: 11 (ref 5–15)
BUN: 50 mg/dL — ABNORMAL HIGH (ref 8–23)
CO2: 23 mmol/L (ref 22–32)
Calcium: 8.5 mg/dL — ABNORMAL LOW (ref 8.9–10.3)
Chloride: 92 mmol/L — ABNORMAL LOW (ref 98–111)
Creatinine, Ser: 1.68 mg/dL — ABNORMAL HIGH (ref 0.61–1.24)
GFR, Estimated: 39 mL/min — ABNORMAL LOW (ref >60)
Glucose, Bld: 124 mg/dL — ABNORMAL HIGH (ref 70–99)
Potassium: 4.3 mmol/L (ref 3.5–5.1)
Sodium: 126 mmol/L — ABNORMAL LOW (ref 135–145)

## 2020-08-10 MED ORDER — ENOXAPARIN SODIUM 40 MG/0.4ML ~~LOC~~ SOLN: 40.0000 mg | SUBCUTANEOUS | Status: DC

## 2020-08-10 MED ORDER — ASPIRIN EC 81 MG PO TBEC: 81.0000 mg | DELAYED_RELEASE_TABLET | Freq: Two times a day (BID) | ORAL | 0 refills | Status: AC

## 2020-08-10 MED ORDER — FUROSEMIDE 40 MG PO TABS: 40.0000 mg | ORAL_TABLET | Freq: Every day | ORAL | 0 refills | Status: DC

## 2020-08-10 NOTE — Discharge Summary (Signed)
Physician Discharge Summary  [name] AOZ:[id number] DOB: 1931-01-25 DOA: 08/05/2020  PCP: Baxter Hire, MD  Admit date: 08/05/2020 Discharge date: 08/10/2020  Admitted From: Home Disposition:  Home  Recommendations for Outpatient Follow-up:  1. Follow up with PCP in 1-2 weeks 2. Follow up with Orthopedic Surgery as scheduled  Discharge Condition:Stable CODE STATUS:Full Diet recommendation: Heart healthy   Brief/Interim Summary: 86 y.o.malewith medical history significant forstage IIICKD, hypertension, dyslipidemia, anemia who presents to the ER for evaluation of pain in his right knee. Patient is s/p IM nailing for right hip fracture in August, 2021. He had a mechanical fall 2 days prior to this admission and presents with complaints of severe pain in the medial aspect of his right knee. He also endorsed some exertional dyspnea, orthopnea and PND. Had about 6 to 8 pound weight gain despite taking his home meds.  Found to have elevated BNP at 2713, troponin 62>>68, chest x-ray with bilateral pleural effusion, right greater than left and pulmonary edema, right lower extremity imaging with periprosthetic fracture of femur  Discharge Diagnoses:  Principal Problem:   CHF (congestive heart failure) (HCC) Active Problems:   Benign essential HTN   Cardiomyopathy, ischemic   Chronic kidney disease   Periprosthetic fracture around internal prosthetic hip joint   Hyponatremia   Pressure injury of skin  Acute on chronic systolic heart failure. Patient symptoms and labs are concerning for acute on chronic systolic heart failure.  BNP elevated at 2713 and echocardiogram done in August 2021 with EF of 40 to 45%, multi valvular disease and a functional bioprosthetic aortic valve. Cardiology was consulted and they are recommending diuresis before proceeding for any surgery, they are recommending ideally tomorrow. There is another increase in his creatinine .  Appears  euvolemic. -Cardiology was also consulted-appreciate their help. -Continue on p.o. Lasix. -Remained stable. Pt to f/u with Cardiology on d/c  Right periprosthetic fracture of femur S/P ORIF on 08/08/20.  Secondary to mechanical fall. Orthopedic was consulted and they are recommending intramedullary nail placement.  Underwent successful ORIF this admit -Continue with pain management. -Postoperative physical therapy is recommending home health services.  Hematuria.  Patient developed hematuria after removing Foley catheter, most likely traumatic. -UA unremarkable for UTI -Remained stable  Elevated troponin. Patient has an history of CAD. No chest pain. Most likely secondary to demand ischemia with acute on chronic HFrEF. -Per cardiology there is no need for further investigation. -Held home dose of aspirin prior to anticipated surgery. Per Orthopedic Surgery, recommendation for ASA 81mg  bid on d/c -Continue statin  AKI with CKD stage IIIa. Most likely with volume up secondary to acute on chronic systolic failure. Creatinine at 1.90, baseline around 1.3-1.4. -Avoid nephrotoxins.  Hyponatremia.   -Labs and trends reviewed. Pt has chronic hyponatremia -Current sodium appears near his baseline -Pt is asymptomatic  Hypertension.  -Blood pressure within goal. -Continue home meds.   Discharge Instructions   Allergies as of 08/10/2020      Reactions   Morphine Anaphylaxis   Oxycodone Other (See Comments)   Hypotension and bradycardia   Carvedilol    Dizziness and syncope    Iodinated Diagnostic Agents Rash   Other reaction(s): Asthenia (finding), Other (qualifier value) paralyzed legs Other reaction(s): RASH   Lisinopril Cough, Other (See Comments)      Medication List    STOP taking these medications   Bensal HP 3 % Oint Generic drug: Salicylic Acid     TAKE these medications   amLODipine 5  MG tablet Commonly known as: NORVASC Take 5 mg by mouth daily.    aspirin EC 81 MG tablet Take 1 tablet (81 mg total) by mouth 2 (two) times daily. Swallow whole. What changed: when to take this   docusate sodium 100 MG capsule Commonly known as: COLACE Take 1 capsule (100 mg total) by mouth 2 (two) times daily.   enoxaparin 40 MG/0.4ML injection Commonly known as: LOVENOX Inject 0.4 mLs (40 mg total) into the skin daily for 14 days.   fluticasone 50 MCG/ACT nasal spray Commonly known as: FLONASE Place 2 sprays into both nostrils daily.   furosemide 40 MG tablet Commonly known as: LASIX Take 1 tablet (40 mg total) by mouth daily. Start taking on: August 11, 2020   irbesartan 150 MG tablet Commonly known as: AVAPRO Take 1 tablet by mouth 2 (two) times daily.   magnesium citrate Soln Take 1 Bottle by mouth as needed for severe constipation.   melatonin 3 MG Tabs tablet Take 1.5 mg by mouth at bedtime as needed (sleep).   polyethylene glycol 17 g packet Commonly known as: MIRALAX / GLYCOLAX Take 17 g by mouth daily as needed for moderate constipation or severe constipation.   pravastatin 20 MG tablet Commonly known as: PRAVACHOL Take 20 mg by mouth every evening.   sodium bicarbonate 650 MG tablet Take 1,300 mg by mouth 2 (two) times daily.   traMADol-acetaminophen 37.5-325 MG tablet Commonly known as: ULTRACET Take 1-2 tablets by mouth every 6 (six) hours as needed for severe pain.       Follow-up Information    [facility] on 08/22/2020.   Specialty: Cardiology Why: at 11:30am. Enter through the [place] entrance Contact information: [place] on 08/15/2020.   Why: Keep your regularly scheduled appointment for outpatient PT on Monday 11/29 at 2:30. They will do another evaluation at that time.  Contact information: [place]  [phone]              Allergies  Allergen Reactions  . Morphine Anaphylaxis  . Oxycodone Other (See Comments)    Hypotension and bradycardia  . Carvedilol     Dizziness and syncope   . Iodinated Diagnostic Agents Rash    Other reaction(s): Asthenia (finding), Other (qualifier value) paralyzed legs Other reaction(s): RASH   . Lisinopril Cough and Other (See Comments)    Consultations:  Orthopedic Surgery  Cardiology  Procedures/Studies: CT FEMUR RIGHT WO CONTRAST  Result Date: 08/05/2020 CLINICAL DATA:  History of remote fracture with internal fixation. Fall today, pain. EXAM: CT OF THE LOWER RIGHT EXTREMITY WITHOUT CONTRAST TECHNIQUE: Multidetector CT imaging of the right lower extremity was performed according to the standard protocol. COMPARISON:  Plain films today FINDINGS: Changes of remote right femoral intertrochanteric fracture with internal fixation with hip screw and intramedullary nail. Fracture lines remain evident throughout the intertrochanteric region. This could reflect nonunion although cannot exclude re-injury/re-fracture. There is a nondisplaced acute fracture seen in the proximal shaft along the intramedullary nail. No subluxation or dislocation. IMPRESSION: Extensive comminuted old right intertrochanteric fracture with internal fixation. Fracture lines remain evident and may be related to nonunion although re-fracture cannot be excluded. Acute nondisplaced fracture through the proximal femoral shaft adjacent to the intramedullary nail. Electronically Signed   By: Rolm Baptise M.D.   On: 08/05/2020  22:28   DG Chest Port 1 View  Result Date: 08/07/2020 CLINICAL DATA:  Initial evaluation for CHF. EXAM: PORTABLE CHEST 1 VIEW COMPARISON:  Prior radiograph from 08/05/2020. FINDINGS: Median sternotomy wires underlying CABG markers and valvular prosthesis. Cardiomegaly, stable. Mediastinal silhouette within normal limits. Lungs mildly hypoinflated.  Diffuse vascular and interstitial prominence, compatible with pulmonary interstitial edema, slightly greater within the right lung as compared to the left. Overall, appearance is mildly improved from previous. Right greater than left pleural effusions, also slightly improved. Associated mild bibasilar atelectasis. Few more hazy opacities overlying the right upper lung favored to be related to edema. No definite focal infiltrates. No pneumothorax. Few prominent gaseous loops of bowel noted within the upper abdomen. No acute osseous finding. IMPRESSION: 1. Cardiomegaly with moderate diffuse pulmonary interstitial edema and bilateral pleural effusions, overall mildly improved from previous. 2. Superimposed hazy opacities overlying the right upper lung, favored to be related to edema. No definite focal infiltrates. Electronically Signed   By: Jeannine Boga M.D.   On: 08/07/2020 05:09   DG Chest Portable 1 View  Result Date: 08/05/2020 CLINICAL DATA:  Hip fracture.  Admission. EXAM: PORTABLE CHEST 1 VIEW COMPARISON:  May 11, 2020 FINDINGS: There is a large layering right pleural effusion with underlying atelectasis. There is a small left pleural effusion. Suspected mild edema. Stable cardiomegaly. The hila and mediastinum are unchanged. No pneumothorax. No other acute abnormalities. IMPRESSION: Cardiomegaly, right greater than left pleural effusions as above, and pulmonary edema. Electronically Signed   By: Dorise Bullion III M.D   On: 08/05/2020 20:52   DG Knee Complete 4 Views Right  Result Date: 08/05/2020 CLINICAL DATA:  Right hip pain.  Recent fall. EXAM: RIGHT KNEE - COMPLETE 4+ VIEW COMPARISON:  None. FINDINGS: The patient is status post right knee replacement. Visualized hardware is in good position. No joint effusion. No fracture identified. Vascular calcifications are noted. IMPRESSION: Right knee replacement. Hardware is in good position. No identified fracture. Electronically Signed   By:  Dorise Bullion III M.D   On: 08/05/2020 20:51   DG HIP OPERATIVE UNILAT W OR W/O PELVIS RIGHT  Result Date: 08/08/2020 CLINICAL DATA:  ORIF EXAM: OPERATIVE right HIP (WITH PELVIS IF PERFORMED) 2 VIEWS TECHNIQUE: Fluoroscopic spot image(s) were submitted for interpretation post-operatively. COMPARISON:  08/05/2020 FINDINGS: Two views of the distal femur show an intramedullary nail with 2 distal locking screws. Components appear well position. Prior total knee replacement. Mid and proximal femur were not included. IMPRESSION: Good appearance following ORIF for femur fracture, as seen. Intramedullary nail with 2 distal locking screws. Electronically Signed   By: Nelson Chimes M.D.   On: 08/08/2020 16:15   DG Hip Unilat W or Wo Pelvis 2-3 Views Right  Result Date: 08/05/2020 CLINICAL DATA:  Right hip pain. EXAM: DG HIP (WITH OR WITHOUT PELVIS) 2-3V RIGHT COMPARISON:  05/11/2020 FINDINGS: Hip screw and intramedullary nail in place across a right femoral intertrochanteric fracture. There is a lucency now extending down into the proximal femoral shaft concerning for acute fracture. No subluxation or dislocation. IMPRESSION: Evidence of remote fracture and internal fixation in the right intertrochanteric region. New lucency noted extending into the proximal femoral shaft adjacent to the intramedullary nail compatible with acute fracture. Electronically Signed   By: Rolm Baptise M.D.   On: 08/05/2020 20:56     Subjective: Eager to go home  Discharge Exam: Vitals:   08/10/20 0414 08/10/20 0754  BP: 112/72 (!) 111/56  Pulse: 88 86  Resp: 17 17  Temp: 97.8 F (36.6 C) 98.1 F (36.7 C)  SpO2: 97% 96%   Vitals:   08/09/20 2031 08/10/20 0005 08/10/20 0414 08/10/20 0754  BP: 101/66 126/90 112/72 (!) 111/56  Pulse: 85 86 88 86  Resp: 16 17 17 17   Temp: 97.8 F (36.6 C) (!) 97.5 F (36.4 C) 97.8 F (36.6 C) 98.1 F (36.7 C)  TempSrc: Oral Oral Oral Oral  SpO2: 95% 96% 97% 96%  Weight:       Height:        General: Pt is alert, awake, not in acute distress Cardiovascular: RRR, S1/S2 +, no rubs, no gallops Respiratory: CTA bilaterally, no wheezing, no rhonchi Abdominal: Soft, NT, ND, bowel sounds + Extremities: no edema, no cyanosis   The results of significant diagnostics from this hospitalization (including imaging, microbiology, ancillary and laboratory) are listed below for reference.     Microbiology: Recent Results (from the past 240 hour(s))  Resp Panel by RT-PCR (Flu A&B, Covid) Nasopharyngeal Swab     Status: None   Collection Time: 08/06/20 12:38 AM   Specimen: Nasopharyngeal Swab; Nasopharyngeal(NP) swabs in vial transport medium  Result Value Ref Range Status   SARS Coronavirus 2 by RT PCR NEGATIVE NEGATIVE Final    Comment: (NOTE) SARS-CoV-2 target nucleic acids are NOT DETECTED.  The SARS-CoV-2 RNA is generally detectable in upper respiratory specimens during the acute phase of infection. The lowest concentration of SARS-CoV-2 viral copies this assay can detect is 138 copies/mL. A negative result does not preclude SARS-Cov-2 infection and should not be used as the sole basis for treatment or other patient management decisions. A negative result may occur with  improper specimen collection/handling, submission of specimen other than nasopharyngeal swab, presence of viral mutation(s) within the areas targeted by this assay, and inadequate number of viral copies(<138 copies/mL). A negative result must be combined with clinical observations, patient history, and epidemiological information. The expected result is Negative.  Fact Sheet for Patients:  EntrepreneurPulse.com.au  Fact Sheet for Healthcare Providers:  IncredibleEmployment.be  This test is no t yet approved or cleared by the [place] FDA and  has been authorized for detection and/or diagnosis of SARS-CoV-2 by FDA under an Emergency Use Authorization  (EUA). This EUA will remain  in effect (meaning this test can be used) for the duration of the COVID-19 declaration under Section 564(b)(1) of the Act, 21 U.S.C.section 360bbb-3(b)(1), unless the authorization is terminated  or revoked sooner.       Influenza A by PCR NEGATIVE NEGATIVE Final   Influenza B by PCR NEGATIVE NEGATIVE Final    Comment: (NOTE) The Xpert Xpress SARS-CoV-2/FLU/RSV plus assay is intended as an aid in the diagnosis of influenza from Nasopharyngeal swab specimens and should not be used as a sole basis for treatment. Nasal washings and aspirates are unacceptable for Xpert Xpress SARS-CoV-2/FLU/RSV testing.  Fact Sheet for Patients: EntrepreneurPulse.com.au  Fact Sheet for Healthcare Providers: IncredibleEmployment.be  This test is not yet approved or cleared by the [place] FDA and has been authorized for detection and/or diagnosis of SARS-CoV-2 by FDA under an Emergency Use Authorization (EUA). This EUA will remain in effect (meaning this test can be used) for the duration of the COVID-19 declaration under Section 564(b)(1) of the Act, 21 U.S.C. section 360bbb-3(b)(1), unless the authorization is terminated or revoked.  Performed at [facility], [address] [place], [place] [address]   Surgical PCR screen     Status: None  Collection Time: 08/07/20  8:51 PM   Specimen: Nasal Mucosa; Nasal Swab  Result Value Ref Range Status   MRSA, PCR NEGATIVE NEGATIVE Final   Staphylococcus aureus NEGATIVE NEGATIVE Final    Comment: (NOTE) The Xpert SA Assay (FDA approved for NASAL specimens in patients 80 years of age and older), is one component of a comprehensive surveillance program. It is not intended to diagnose infection nor to guide or monitor treatment. Performed at [facility], [address] [place], [place] [address]      Labs: BNP (last 3 results) Recent Labs    08/05/20 2018   BNP 0,175.1*   Basic Metabolic Panel: Recent Labs  Lab 08/06/20 0500 08/06/20 0500 08/07/20 0429 08/08/20 0314 08/08/20 1701 08/09/20 0525 08/10/20 0445  NA 128*  --  130* 129*  --  130* 126*  K 4.8  --  3.9 4.2  --  4.3 4.3  CL 98  --  95* 93*  --  94* 92*  CO2 19*  --  23 25  --  24 23  GLUCOSE 116*  --  92 89  --  90 124*  BUN 37*  --  36* 44*  --  45* 50*  CREATININE 1.68*   < > 1.74* 1.96* 1.96* 1.90* 1.68*  CALCIUM 8.7*  --  8.7* 8.3*  --  8.6* 8.5*   < > = values in this interval not displayed.   Liver Function Tests: No results for input(s): AST, ALT, ALKPHOS, BILITOT, PROT, ALBUMIN in the last 168 hours. No results for input(s): LIPASE, AMYLASE in the last 168 hours. No results for input(s): AMMONIA in the last 168 hours. CBC: Recent Labs  Lab 08/05/20 2018 08/08/20 1701 08/09/20 0525 08/10/20 0445  WBC 6.9 6.3 7.3 5.8  HGB 12.6* 13.3 12.7* 12.3*  HCT 36.5* 39.1 35.6* 34.3*  MCV 90.1 90.1 86.8 87.1  PLT 242 247 235 217   Cardiac Enzymes: No results for input(s): CKTOTAL, CKMB, CKMBINDEX, TROPONINI in the last 168 hours. BNP: Invalid input(s): POCBNP CBG: No results for input(s): GLUCAP in the last 168 hours. D-Dimer No results for input(s): DDIMER in the last 72 hours. Hgb A1c No results for input(s): HGBA1C in the last 72 hours. Lipid Profile No results for input(s): CHOL, HDL, LDLCALC, TRIG, CHOLHDL, LDLDIRECT in the last 72 hours. Thyroid function studies No results for input(s): TSH, T4TOTAL, T3FREE, THYROIDAB in the last 72 hours.  Invalid input(s): FREET3 Anemia work up No results for input(s): VITAMINB12, FOLATE, FERRITIN, TIBC, IRON, RETICCTPCT in the last 72 hours. Urinalysis    Component Value Date/Time   COLORURINE YELLOW (A) 08/09/2020 1637   APPEARANCEUR CLEAR (A) 08/09/2020 1637   APPEARANCEUR Clear 11/22/2015 0950   LABSPEC 1.012 08/09/2020 1637   PHURINE 6.0 08/09/2020 1637   GLUCOSEU NEGATIVE 08/09/2020 1637   HGBUR SMALL  (A) 08/09/2020 1637   BILIRUBINUR NEGATIVE 08/09/2020 1637   BILIRUBINUR Negative 11/22/2015 0950   KETONESUR NEGATIVE 08/09/2020 1637   PROTEINUR 30 (A) 08/09/2020 1637   UROBILINOGEN 1.0 02/16/2010 1336   NITRITE NEGATIVE 08/09/2020 1637   LEUKOCYTESUR NEGATIVE 08/09/2020 1637   Sepsis Labs Invalid input(s): PROCALCITONIN,  WBC,  LACTICIDVEN Microbiology Recent Results (from the past 240 hour(s))  Resp Panel by RT-PCR (Flu A&B, Covid) Nasopharyngeal Swab     Status: None   Collection Time: 08/06/20 12:38 AM   Specimen: Nasopharyngeal Swab; Nasopharyngeal(NP) swabs in vial transport medium  Result Value Ref Range Status   SARS Coronavirus 2  by RT PCR NEGATIVE NEGATIVE Final    Comment: (NOTE) SARS-CoV-2 target nucleic acids are NOT DETECTED.  The SARS-CoV-2 RNA is generally detectable in upper respiratory specimens during the acute phase of infection. The lowest concentration of SARS-CoV-2 viral copies this assay can detect is 138 copies/mL. A negative result does not preclude SARS-Cov-2 infection and should not be used as the sole basis for treatment or other patient management decisions. A negative result may occur with  improper specimen collection/handling, submission of specimen other than nasopharyngeal swab, presence of viral mutation(s) within the areas targeted by this assay, and inadequate number of viral copies(<138 copies/mL). A negative result must be combined with clinical observations, patient history, and epidemiological information. The expected result is Negative.  Fact Sheet for Patients:  EntrepreneurPulse.com.au  Fact Sheet for Healthcare Providers:  IncredibleEmployment.be  This test is no t yet approved or cleared by the [place] FDA and  has been authorized for detection and/or diagnosis of SARS-CoV-2 by FDA under an Emergency Use Authorization (EUA). This EUA will remain  in effect (meaning this test can be  used) for the duration of the COVID-19 declaration under Section 564(b)(1) of the Act, 21 U.S.C.section 360bbb-3(b)(1), unless the authorization is terminated  or revoked sooner.       Influenza A by PCR NEGATIVE NEGATIVE Final   Influenza B by PCR NEGATIVE NEGATIVE Final    Comment: (NOTE) The Xpert Xpress SARS-CoV-2/FLU/RSV plus assay is intended as an aid in the diagnosis of influenza from Nasopharyngeal swab specimens and should not be used as a sole basis for treatment. Nasal washings and aspirates are unacceptable for Xpert Xpress SARS-CoV-2/FLU/RSV testing.  Fact Sheet for Patients: EntrepreneurPulse.com.au  Fact Sheet for Healthcare Providers: IncredibleEmployment.be  This test is not yet approved or cleared by the [place] FDA and has been authorized for detection and/or diagnosis of SARS-CoV-2 by FDA under an Emergency Use Authorization (EUA). This EUA will remain in effect (meaning this test can be used) for the duration of the COVID-19 declaration under Section 564(b)(1) of the Act, 21 U.S.C. section 360bbb-3(b)(1), unless the authorization is terminated or revoked.  Performed at [facility], [address], [place], [place] [address]   Surgical PCR screen     Status: None   Collection Time: 08/07/20  8:51 PM   Specimen: Nasal Mucosa; Nasal Swab  Result Value Ref Range Status   MRSA, PCR NEGATIVE NEGATIVE Final   Staphylococcus aureus NEGATIVE NEGATIVE Final    Comment: (NOTE) The Xpert SA Assay (FDA approved for NASAL specimens in patients 28 years of age and older), is one component of a comprehensive surveillance program. It is not intended to diagnose infection nor to guide or monitor treatment. Performed at [facility], [address] [place], [place] [address]    Time spent: 60min  SIGNED:   Marylu Lund, MD  [facility] 08/10/2020, 11:09 AM  If 7PM-7AM, please contact  night-coverage

## 2020-08-10 NOTE — Progress Notes (Signed)
Physical Therapy Treatment Patient Details Name: [name] MRN: [id number] DOB: Sep 18, 1930 Today's Date: 08/10/2020    History of Present Illness DAISHON CHUI is a 86 y.o. male with medical history significant for stage III CKD, hypertension, dyslipidemia, anemia, and IM nailing for R hip fx in Auguts, 2021 who presents to the ER for evaluation of pain in his right knee after a mechanical fall ~2 days prior to presentation. Immging revealed "New lucency noted extending into the proximal femoral shaft adjacent to the intramedullary nail compatible with acute fracture". Pt is now S/p ORIF.    PT Comments    Pt was long sitting in bed upon arriving. He agrees to PT session and is cooperative and pleasant throughout. A and O x 4. Able to exit bed without assistance. Stand to RW and ambulate 200 ft with supervision. Reviewed there ex for home exercise program. Pt is progressing well with PT and does not want [place] services. Pt would like to transition to OP PT at DC. He is cleared form acute PT standpoint for safe DC home once deemed medically stable. At conclusion of session, pt was in recliner with call bell in reach, chair alarm in place, and pt stating,"I ready to DC as soon as possible."   Follow Up Recommendations  Outpatient PT;Supervision - Intermittent     Equipment Recommendations  None recommended by PT    Recommendations for Other Services       Precautions / Restrictions Precautions Precautions: Fall Restrictions Weight Bearing Restrictions: Yes RLE Weight Bearing: Weight bearing as tolerated    Mobility  Bed Mobility Overal bed mobility: Modified Independent                Transfers Overall transfer level: Needs assistance Equipment used: Rolling walker (2 wheeled) Transfers: Sit to/from Stand Sit to Stand: Supervision            Ambulation/Gait Ambulation/Gait assistance: Supervision Gait Distance (Feet): 200 Feet Assistive device: Rolling walker  (2 wheeled) Gait Pattern/deviations: WFL(Within Functional Limits) Gait velocity: decreased   General Gait Details: 2 standing rest       Balance Overall balance assessment: Needs assistance Sitting-balance support: No upper extremity supported;Feet supported Sitting balance-Leahy Scale: Good     Standing balance support: Bilateral upper extremity supported;During functional activity Standing balance-Leahy Scale: Good         Cognition Arousal/Alertness: Awake/alert Behavior During Therapy: WFL for tasks assessed/performed Overall Cognitive Status: Within Functional Limits for tasks assessed    General Comments: Pt A and O x 4         General Comments General comments (skin integrity, edema, etc.): reviewed HEP and pt states he I'ly performs      Pertinent Vitals/Pain Pain Assessment: No/denies pain Pain Score: 0-No pain Pain Intervention(s): Limited activity within patient's tolerance;Monitored during session;Premedicated before session;Repositioned           PT Goals (current goals can now be found in the care plan section) Acute Rehab PT Goals Patient Stated Goal: go home Progress towards PT goals: Progressing toward goals    Frequency    BID      PT Plan Current plan remains appropriate       AM-PAC PT "6 Clicks" Mobility   Outcome Measure  Help needed turning from your back to your side while in a flat bed without using bedrails?: A Little Help needed moving from lying on your back to sitting on the side of a flat bed without using bedrails?:  A Little Help needed moving to and from a bed to a chair (including a wheelchair)?: A Little Help needed standing up from a chair using your arms (e.g., wheelchair or bedside chair)?: A Little Help needed to walk in hospital room?: A Little Help needed climbing 3-5 steps with a railing? : A Little 6 Click Score: 18    End of Session   Activity Tolerance: Patient tolerated treatment well Patient left:  in chair;with chair alarm set;with call bell/phone within reach;with nursing/sitter in room Nurse Communication: Mobility status PT Visit Diagnosis: Unsteadiness on feet (R26.81);History of falling (Z91.81);Repeated falls (R29.6);Other abnormalities of gait and mobility (R26.89)     Time: 9628-3662 PT Time Calculation (min) (ACUTE ONLY): 25 min  Charges:  $Gait Training: 8-22 mins $Therapeutic Activity: 8-22 mins                     Julaine Fusi PTA 08/10/20, 9:17 AM

## 2020-08-10 NOTE — Care Management Important Message (Signed)
Important Message  Patient Details  Name: [name] MRN: [id number] Date of Birth: March 11, 1931   Medicare Important Message Given:  Yes     [name] 08/10/2020, 11:06 AM

## 2020-08-10 NOTE — Progress Notes (Signed)
PHARMACIST - PHYSICIAN COMMUNICATION  CONCERNING:  Enoxaparin (Lovenox) for DVT Prophylaxis    RECOMMENDATION: Patient was prescribed enoxaprin 30mg  q24 hours for VTE prophylaxis.   Filed Weights   08/07/20 0500 08/08/20 0541 08/09/20 0500  Weight: 78.6 kg (173 lb 3.2 oz) 76.2 kg (168 lb 1.6 oz) 76 kg (167 lb 8.8 oz)    Body mass index is 23.37 kg/m.  Estimated Creatinine Clearance: 33 mL/min (A) (by C-G formula based on SCr of 1.68 mg/dL (H)).   Patient is candidate for enoxaparin 40mg  every 24 hours based on CrCl >82ml/min or Weight >45kg  DESCRIPTION: Pharmacy has adjusted enoxaparin dose per [facility] policy.  Patient is now receiving enoxaparin 40 mg every 24 hours    Rowland Lathe, PharmD Clinical Pharmacist  08/10/2020 8:35 AM

## 2020-08-10 NOTE — Progress Notes (Signed)
Discharge education and instructions given to patient. Verbalized understanding. No s/s of distress, VSS.  Pt being transported home via son and daughter in law in a private vehicle.

## 2020-08-10 NOTE — Plan of Care (Signed)

## 2020-08-10 NOTE — Progress Notes (Signed)
Subjective:  Patient reports pain as mild.    Objective:   VITALS:   Vitals:   08/09/20 2031 08/10/20 0005 08/10/20 0414 08/10/20 0754  BP: 101/66 126/90 112/72 (!) 111/56  Pulse: 85 86 88 86  Resp: 16 17 17 17   Temp: 97.8 F (36.6 C) (!) 97.5 F (36.4 C) 97.8 F (36.6 C) 98.1 F (36.7 C)  TempSrc: Oral Oral Oral Oral  SpO2: 95% 96% 97% 96%  Weight:      Height:        PHYSICAL EXAM:  Neurologically intact ABD soft Neurovascular intact Sensation intact distally Intact pulses distally Dorsiflexion/Plantar flexion intact Incision: dressing C/D/I No cellulitis present Compartment soft  LABS  Results for orders placed or performed during the hospital encounter of 08/05/20 (from the past 24 hour(s))  Urinalysis, Complete w Microscopic Urine, Clean Catch     Status: Abnormal   Collection Time: 08/09/20  4:37 PM  Result Value Ref Range   Color, Urine YELLOW (A) YELLOW   APPearance CLEAR (A) CLEAR   Specific Gravity, Urine 1.012 1.005 - 1.030   pH 6.0 5.0 - 8.0   Glucose, UA NEGATIVE NEGATIVE mg/dL   Hgb urine dipstick SMALL (A) NEGATIVE   Bilirubin Urine NEGATIVE NEGATIVE   Ketones, ur NEGATIVE NEGATIVE mg/dL   Protein, ur 30 (A) NEGATIVE mg/dL   Nitrite NEGATIVE NEGATIVE   Leukocytes,Ua NEGATIVE NEGATIVE   RBC / HPF 0-5 0 - 5 RBC/hpf   WBC, UA 0-5 0 - 5 WBC/hpf   Bacteria, UA NONE SEEN NONE SEEN   Squamous Epithelial / LPF 0-5 0 - 5  Basic metabolic panel     Status: Abnormal   Collection Time: 08/10/20  4:45 AM  Result Value Ref Range   Sodium 126 (L) 135 - 145 mmol/L   Potassium 4.3 3.5 - 5.1 mmol/L   Chloride 92 (L) 98 - 111 mmol/L   CO2 23 22 - 32 mmol/L   Glucose, Bld 124 (H) 70 - 99 mg/dL   BUN 50 (H) 8 - 23 mg/dL   Creatinine, Ser 1.68 (H) 0.61 - 1.24 mg/dL   Calcium 8.5 (L) 8.9 - 10.3 mg/dL   GFR, Estimated 39 (L) >60 mL/min   Anion gap 11 5 - 15  CBC     Status: Abnormal   Collection Time: 08/10/20  4:45 AM  Result Value Ref Range   WBC 5.8  4.0 - 10.5 K/uL   RBC 3.94 (L) 4.22 - 5.81 MIL/uL   Hemoglobin 12.3 (L) 13.0 - 17.0 g/dL   HCT 34.3 (L) 39 - 52 %   MCV 87.1 80.0 - 100.0 fL   MCH 31.2 26.0 - 34.0 pg   MCHC 35.9 30.0 - 36.0 g/dL   RDW 14.1 11.5 - 15.5 %   Platelets 217 150 - 400 K/uL   nRBC 0.0 0.0 - 0.2 %    DG HIP OPERATIVE UNILAT W OR W/O PELVIS RIGHT  Result Date: 08/08/2020 CLINICAL DATA:  ORIF EXAM: OPERATIVE right HIP (WITH PELVIS IF PERFORMED) 2 VIEWS TECHNIQUE: Fluoroscopic spot image(s) were submitted for interpretation post-operatively. COMPARISON:  08/05/2020 FINDINGS: Two views of the distal femur show an intramedullary nail with 2 distal locking screws. Components appear well position. Prior total knee replacement. Mid and proximal femur were not included. IMPRESSION: Good appearance following ORIF for femur fracture, as seen. Intramedullary nail with 2 distal locking screws. Electronically Signed   By: Nelson Chimes M.D.   On: 08/08/2020 16:15  Assessment/Plan: 2 Days Post-Op   Principal Problem:   CHF (congestive heart failure) (HCC) Active Problems:   Benign essential HTN   Cardiomyopathy, ischemic   Chronic kidney disease   Periprosthetic fracture around internal prosthetic hip joint   Hyponatremia   Pressure injury of skin   Advance diet Up with therapy  WBAT RLE Lovenox 30 mg daily while in hospital, May change to Aspirin 81 mg twice daily once home  Okay to discharge from ortho standpoint with outpatient PT Continue pain control Will follow up with Dr. Alvira Philips office 08/22/20 for staple removal.  Please call to confirm appointment Gross , PA-C 08/10/2020, 8:23 AM

## 2020-08-11 LAB — URINE CULTURE: Culture: NO GROWTH

## 2020-08-15 DIAGNOSIS — M79604 Pain in right leg: Secondary | ICD-10-CM | POA: Diagnosis not present

## 2020-08-15 DIAGNOSIS — R29898 Other symptoms and signs involving the musculoskeletal system: Secondary | ICD-10-CM | POA: Diagnosis not present

## 2020-08-15 DIAGNOSIS — R262 Difficulty in walking, not elsewhere classified: Secondary | ICD-10-CM | POA: Diagnosis not present

## 2020-08-15 DIAGNOSIS — Z9889 Other specified postprocedural states: Secondary | ICD-10-CM | POA: Diagnosis not present

## 2020-08-15 DIAGNOSIS — Z8781 Personal history of (healed) traumatic fracture: Secondary | ICD-10-CM | POA: Diagnosis not present

## 2020-08-18 ENCOUNTER — Ambulatory Visit: Payer: PPO | Admitting: Dermatology

## 2020-08-20 NOTE — Progress Notes (Deleted)
   Patient ID: [name], male    DOB: Jan 17, 1931, 86 y.o.   MRN: [id number]  HPI  Mr [name] is a 86 y/o male with a history of  Echo report from 04/18/20 reviewed and showed an EF of 45% along with mild LAE and moderate RAE.   Admitted 08/05/20 due to right knee pain after a mechanical fall. Home weight gain noted of 6-8 pounds. BNP elevated. Cardiology and orthopaedic consults done. Diuretics continued. Right ORIF completed 08/08/20. Hematuria noted after foley removal. UA negative for UTI. Elevated troponin thought to be due to demand ischemia. Discharged after 5 days.   He presents today for his initial visit with a chief complaint of   Review of Systems    Physical Exam  Assessment & Plan:  1: Chronic heart failure with mildly reduced ejection fraction- - NYHA class  - saw cardiology [name]) 04/26/20 - BNP 08/05/20 was 2713.8  2: HTN- - BP - saw PCP [name]) 07/05/20 - BMP 08/10/20 reviewed and showed sodium 126, potassium 4.3, creatinine 1.68 and GFR 39  3: Recent femur fracture- - recent ORIF November 2021 - saw orthopaedics [name]) 08/04/20   4: CKD- - saw nephrology [name]) 07/25/20

## 2020-08-22 ENCOUNTER — Telehealth: Payer: Self-pay | Admitting: Family

## 2020-08-22 ENCOUNTER — Ambulatory Visit: Payer: PPO | Admitting: Family

## 2020-08-22 DIAGNOSIS — I255 Ischemic cardiomyopathy: Secondary | ICD-10-CM | POA: Diagnosis not present

## 2020-08-22 DIAGNOSIS — Z09 Encounter for follow-up examination after completed treatment for conditions other than malignant neoplasm: Secondary | ICD-10-CM | POA: Diagnosis not present

## 2020-08-22 DIAGNOSIS — I10 Essential (primary) hypertension: Secondary | ICD-10-CM | POA: Diagnosis not present

## 2020-08-22 DIAGNOSIS — Z8781 Personal history of (healed) traumatic fracture: Secondary | ICD-10-CM | POA: Diagnosis not present

## 2020-08-22 DIAGNOSIS — N1831 Chronic kidney disease, stage 3a: Secondary | ICD-10-CM | POA: Diagnosis not present

## 2020-08-22 NOTE — Telephone Encounter (Signed)
Patient did not show for his [facility] appointment on 08/22/20. Will attempt to reschedule.

## 2020-08-23 DIAGNOSIS — S7291XA Unspecified fracture of right femur, initial encounter for closed fracture: Secondary | ICD-10-CM | POA: Diagnosis not present

## 2020-08-26 DIAGNOSIS — I255 Ischemic cardiomyopathy: Secondary | ICD-10-CM | POA: Diagnosis not present

## 2020-08-26 DIAGNOSIS — J9811 Atelectasis: Secondary | ICD-10-CM | POA: Diagnosis not present

## 2020-08-26 DIAGNOSIS — I10 Essential (primary) hypertension: Secondary | ICD-10-CM | POA: Diagnosis not present

## 2020-08-26 DIAGNOSIS — F419 Anxiety disorder, unspecified: Secondary | ICD-10-CM | POA: Diagnosis not present

## 2020-08-26 DIAGNOSIS — J90 Pleural effusion, not elsewhere classified: Secondary | ICD-10-CM | POA: Diagnosis not present

## 2020-08-26 DIAGNOSIS — N1831 Chronic kidney disease, stage 3a: Secondary | ICD-10-CM | POA: Diagnosis not present

## 2020-08-26 DIAGNOSIS — R0602 Shortness of breath: Secondary | ICD-10-CM | POA: Diagnosis not present

## 2020-08-29 DIAGNOSIS — E871 Hypo-osmolality and hyponatremia: Secondary | ICD-10-CM | POA: Diagnosis not present

## 2020-08-29 DIAGNOSIS — N1832 Chronic kidney disease, stage 3b: Secondary | ICD-10-CM | POA: Diagnosis not present

## 2020-08-29 DIAGNOSIS — I129 Hypertensive chronic kidney disease with stage 1 through stage 4 chronic kidney disease, or unspecified chronic kidney disease: Secondary | ICD-10-CM | POA: Diagnosis not present

## 2020-08-29 DIAGNOSIS — E872 Acidosis: Secondary | ICD-10-CM | POA: Diagnosis not present

## 2020-08-29 DIAGNOSIS — R809 Proteinuria, unspecified: Secondary | ICD-10-CM | POA: Diagnosis not present

## 2020-08-30 DIAGNOSIS — I5021 Acute systolic (congestive) heart failure: Secondary | ICD-10-CM | POA: Diagnosis not present

## 2020-08-30 DIAGNOSIS — I340 Nonrheumatic mitral (valve) insufficiency: Secondary | ICD-10-CM | POA: Diagnosis not present

## 2020-08-30 DIAGNOSIS — I10 Essential (primary) hypertension: Secondary | ICD-10-CM | POA: Diagnosis not present

## 2020-08-30 DIAGNOSIS — I2510 Atherosclerotic heart disease of native coronary artery without angina pectoris: Secondary | ICD-10-CM | POA: Diagnosis not present

## 2020-08-31 ENCOUNTER — Other Ambulatory Visit: Payer: Self-pay

## 2020-08-31 ENCOUNTER — Ambulatory Visit: Payer: PPO | Admitting: Family

## 2020-08-31 ENCOUNTER — Encounter: Payer: PPO | Attending: Internal Medicine | Admitting: Internal Medicine

## 2020-08-31 DIAGNOSIS — I255 Ischemic cardiomyopathy: Secondary | ICD-10-CM | POA: Diagnosis not present

## 2020-08-31 DIAGNOSIS — L89312 Pressure ulcer of right buttock, stage 2: Secondary | ICD-10-CM | POA: Insufficient documentation

## 2020-08-31 DIAGNOSIS — I5022 Chronic systolic (congestive) heart failure: Secondary | ICD-10-CM | POA: Diagnosis not present

## 2020-08-31 DIAGNOSIS — I130 Hypertensive heart and chronic kidney disease with heart failure and stage 1 through stage 4 chronic kidney disease, or unspecified chronic kidney disease: Secondary | ICD-10-CM | POA: Insufficient documentation

## 2020-08-31 DIAGNOSIS — I2510 Atherosclerotic heart disease of native coronary artery without angina pectoris: Secondary | ICD-10-CM | POA: Insufficient documentation

## 2020-08-31 DIAGNOSIS — N183 Chronic kidney disease, stage 3 unspecified: Secondary | ICD-10-CM | POA: Insufficient documentation

## 2020-08-31 DIAGNOSIS — L89322 Pressure ulcer of left buttock, stage 2: Secondary | ICD-10-CM | POA: Insufficient documentation

## 2020-09-01 DIAGNOSIS — F419 Anxiety disorder, unspecified: Secondary | ICD-10-CM | POA: Diagnosis not present

## 2020-09-01 DIAGNOSIS — L89310 Pressure ulcer of right buttock, unstageable: Secondary | ICD-10-CM | POA: Diagnosis not present

## 2020-09-01 DIAGNOSIS — I255 Ischemic cardiomyopathy: Secondary | ICD-10-CM | POA: Diagnosis not present

## 2020-09-01 DIAGNOSIS — J811 Chronic pulmonary edema: Secondary | ICD-10-CM | POA: Diagnosis not present

## 2020-09-01 DIAGNOSIS — J90 Pleural effusion, not elsewhere classified: Secondary | ICD-10-CM | POA: Diagnosis not present

## 2020-09-01 NOTE — Progress Notes (Signed)
James, Holt ([id number]) Visit Report for 08/31/2020 Abuse/Suicide Risk Screen Details Patient Name: [name], [name]. Date of Service: 08/31/2020 12:45 PM Medical Record Number: [id number] Patient Account Number: [id number] Date of Birth/Sex: 15-Jul-1931 (86 y.o. M) Treating RN: Dolan Amen Primary Care Caylin Nass: Harrel Lemon Other Clinician: Referring Sahid Borba: Harrel Lemon Treating Any Mcneice/Extender: Tito Dine in Treatment: 0 Abuse/Suicide Risk Screen Items Answer ABUSE RISK SCREEN: Has anyone close to you tried to hurt or harm you recentlyo No Do you feel uncomfortable with anyone in your familyo No Has anyone forced you do things that you didnot want to doo No Electronic Signature(s) Signed: 08/31/2020 5:15:03 PM By: Georges Mouse, Minus Breeding RN Entered By: Georges Mouse, Kenia on 08/31/2020 13:24:38 Koopman, Durenda James ([id number]) -------------------------------------------------------------------------------- Activities of Daily Living Details Patient Name: James Prude T. Date of Service: 08/31/2020 12:45 PM Medical Record Number: [id number] Patient Account Number: [id number] Date of Birth/Sex: 11/24/1930 (86 y.o. M) Treating RN: Dolan Amen Primary Care Obi Scrima: Harrel Lemon Other Clinician: Referring Waqas Bruhl: Harrel Lemon Treating Shaynna Husby/Extender: Tito Dine in Treatment: 0 Activities of Daily Living Items Answer Activities of Daily Living (Please select one for each item) Drive Automobile Not Able Take Medications Completely Able Use Telephone Completely Able Care for Appearance Completely Able Use Toilet Completely Able Bath / Shower Completely Able Dress Self Completely Able Feed Self Completely Able Walk Completely Able Get In / Out Bed Completely Able Housework Need Assistance Prepare Meals Completely [place] Completely Able Shop for Self Completely Able Electronic Signature(s) Signed: 08/31/2020  5:15:03 PM By: Georges Mouse, Minus Breeding RN Entered By: Georges Mouse, Kenia on 08/31/2020 13:25:39 Rimmer, Durenda James ([id number]) -------------------------------------------------------------------------------- Education Screening Details Patient Name: James Prude T. Date of Service: 08/31/2020 12:45 PM Medical Record Number: [id number] Patient Account Number: [id number] Date of Birth/Sex: 05-15-1931 (86 y.o. M) Treating RN: Dolan Amen Primary Care Breandan People: Harrel Lemon Other Clinician: Referring Nidya Bouyer: Harrel Lemon Treating Jadi Deyarmin/Extender: Tito Dine in Treatment: 0 Primary Learner Assessed: Patient Learning Preferences/Education Level/Primary Language Learning Preference: Explanation, Demonstration Highest Education Level: High School Preferred Language: English Cognitive Barrier Language Barrier: No Translator Needed: No Memory Deficit: No Emotional Barrier: No Cultural/Religious Beliefs Affecting Medical Care: No Physical Barrier Impaired Vision: Yes Glasses Impaired Hearing: Yes Hearing Aid Decreased Hand dexterity: No Knowledge/Comprehension Knowledge Level: High Comprehension Level: High Ability to understand written instructions: High Ability to understand verbal instructions: High Motivation Anxiety Level: Calm Cooperation: Cooperative Education Importance: Acknowledges Need Interest in Health Problems: Asks Questions Perception: Coherent Willingness to Engage in Self-Management High Activities: Readiness to Engage in Self-Management High Activities: Electronic Signature(s) Signed: 08/31/2020 5:15:03 PM By: Georges Mouse, Minus Breeding RN Entered By: Georges Mouse, Minus Breeding on 08/31/2020 13:26:12 Ruttan, Durenda James ([id number]) -------------------------------------------------------------------------------- Fall Risk Assessment Details Patient Name: James Prude T. Date of Service: 08/31/2020 12:45 PM Medical Record Number:  [id number] Patient Account Number: [id number] Date of Birth/Sex: 1931/02/19 (86 y.o. M) Treating RN: Dolan Amen Primary Care Graceyn Fodor: Harrel Lemon Other Clinician: Referring Sinda Leedom: Harrel Lemon Treating Gillian Kluever/Extender: Tito Dine in Treatment: 0 Fall Risk Assessment Items Have you had 2 or more falls in the last 12 monthso 0 Yes Have you had any fall that resulted in injury in the last 12 monthso 0 Yes FALLS RISK SCREEN History of falling - immediate or within 3 months 25 Yes Secondary diagnosis (Do you have 2 or more medical diagnoseso) 15 Yes Ambulatory aid None/bed rest/wheelchair/nurse 0 No Crutches/cane/walker 15 Yes Furniture 0 No Intravenous therapy Access/Saline/Heparin [employer]  0 No Gait/Transferring Normal/ bed rest/ wheelchair 0 Yes Weak (short steps with or without shuffle, stooped but able to lift head while walking, may 0 No seek support from furniture) Impaired (short steps with shuffle, may have difficulty arising from chair, head down, impaired 0 No balance) Mental Status Oriented to own ability 0 Yes Electronic Signature(s) Signed: 08/31/2020 5:15:03 PM By: Georges Mouse, Minus Breeding RN Entered By: Georges Mouse, Kenia on 08/31/2020 13:26:57 Malek, Durenda James ([id number]) -------------------------------------------------------------------------------- Foot Assessment Details Patient Name: James Prude T. Date of Service: 08/31/2020 12:45 PM Medical Record Number: [id number] Patient Account Number: [id number] Date of Birth/Sex: 07/05/1931 (86 y.o. M) Treating RN: Dolan Amen Primary Care Dyon Rotert: Harrel Lemon Other Clinician: Referring Kaytlynne Neace: Harrel Lemon Treating Criss Bartles/Extender: Tito Dine in Treatment: 0 Foot Assessment Items Site Locations + = Sensation present, - = Sensation absent, C = Callus, U = Ulcer R = Redness, W = Warmth, M = Maceration, PU = Pre-ulcerative lesion F = Fissure, S = Swelling, D =  Dryness Assessment Right: Left: Other Deformity: No No Prior Foot Ulcer: No No Prior Amputation: No No Charcot Joint: No No Ambulatory Status: Ambulatory With Help Assistance Device: Walker Gait: Administrator, arts) Signed: 08/31/2020 5:15:03 PM By: Georges Mouse, Minus Breeding RN Entered By: Georges Mouse, Minus Breeding on 08/31/2020 13:27:41 Martinez, Durenda James ([id number]) -------------------------------------------------------------------------------- Nutrition Risk Screening Details Patient Name: James Prude T. Date of Service: 08/31/2020 12:45 PM Medical Record Number: [id number] Patient Account Number: [id number] Date of Birth/Sex: 17-Jan-1931 (86 y.o. M) Treating RN: Dolan Amen Primary Care Undine Nealis: Harrel Lemon Other Clinician: Referring Shakeerah Gradel: Harrel Lemon Treating Loreal Schuessler/Extender: Tito Dine in Treatment: 0 Height (in): 71 Weight (lbs): 164 Body Mass Index (BMI): 22.9 Nutrition Risk Screening Items Score Screening NUTRITION RISK SCREEN: I have an illness or condition that made me change the kind and/or amount of food I eat 0 No I eat fewer than two meals per day 0 No I eat few fruits and vegetables, or milk products 0 No I have three or more drinks of beer, liquor or wine almost every day 0 No I have tooth or mouth problems that make it hard for me to eat 0 No I don't always have enough money to buy the food I need 0 No I eat alone most of the time 1 Yes I take three or more different prescribed or over-the-counter drugs a day 1 Yes Without wanting to, I have lost or gained 10 pounds in the last six months 0 No I am not always physically able to shop, cook and/or feed myself 0 No Nutrition Protocols Good Risk Protocol 0 No interventions needed Moderate Risk Protocol High Risk Proctocol Risk Level: Good Risk Score: 2 Electronic Signature(s) Signed: 08/31/2020 5:15:03 PM By: Georges Mouse, Minus Breeding RN Entered By: Georges Mouse, Minus Breeding  on 08/31/2020 13:27:30

## 2020-09-02 NOTE — Progress Notes (Signed)
IHOR, MEINZER ([id number]) Visit Report for 08/31/2020 Chief Complaint Document Details Patient Name: [name], [name]. Date of Service: 08/31/2020 12:45 PM Medical Record Number: [id number] Patient Account Number: [id number] Date of Birth/Sex: April 17, 1931 (86 y.o. M) Treating RN: Cornell Barman Primary Care Provider: Harrel Lemon Other Clinician: Referring Provider: Harrel Lemon Treating Provider/Extender: Tito Dine in Treatment: 0 Information Obtained from: Patient Chief Complaint 08/31/2020; patient is here for review of wounds on his bilateral buttocks surrounding his coccyx Electronic Signature(s) Signed: 08/31/2020 4:29:45 PM By: Linton Ham MD Entered By: Linton Ham on 08/31/2020 14:05:26 [name], [name] ([id number]) -------------------------------------------------------------------------------- Debridement Details Patient Name: James Prude T. Date of Service: 08/31/2020 12:45 PM Medical Record Number: [id number] Patient Account Number: [id number] Date of Birth/Sex: Aug 28, 1931 (86 y.o. M) Treating RN: Cornell Barman Primary Care Provider: Harrel Lemon Other Clinician: Referring Provider: Harrel Lemon Treating Provider/Extender: Tito Dine in Treatment: 0 Debridement Performed for Wound #1 Left Gluteus Assessment: Performed By: Physician Ricard Dillon, MD Debridement Type: Debridement Level of Consciousness (Pre- Awake and Alert procedure): Pre-procedure Verification/Time Out Yes - 13:54 Taken: Total Area Debrided (L x W): 2.5 (cm) x 2.2 (cm) = 5.5 (cm) Tissue and other material Non-Viable, Slough, Skin: Dermis , Slough debrided: Level: Skin/Dermis Debridement Description: Selective/Open Wound Instrument: Curette Bleeding: None Hemostasis Achieved: Pressure Response to Treatment: Procedure was tolerated well Level of Consciousness (Post- Awake and Alert procedure): Post Debridement Measurements of Total  Wound Length: (cm) 2.5 Stage: Category/Stage II Width: (cm) 2.2 Depth: (cm) 0.1 Volume: (cm) 0.432 Character of Wound/Ulcer Post Debridement: Stable Post Procedure Diagnosis Same as Pre-procedure Electronic Signature(s) Signed: 08/31/2020 4:29:45 PM By: Linton Ham MD Signed: 09/01/2020 5:16:15 PM By: Gretta Cool, BSN, RN, CWS, Kim RN, BSN Entered By: Linton Ham on 08/31/2020 14:05:03 [name], [name] ([id number]) -------------------------------------------------------------------------------- Debridement Details Patient Name: James Prude T. Date of Service: 08/31/2020 12:45 PM Medical Record Number: [id number] Patient Account Number: [id number] Date of Birth/Sex: March 29, 1931 (86 y.o. M) Treating RN: Cornell Barman Primary Care Provider: Harrel Lemon Other Clinician: Referring Provider: Harrel Lemon Treating Provider/Extender: Tito Dine in Treatment: 0 Debridement Performed for Wound #2 Right Gluteus Assessment: Performed By: Physician Ricard Dillon, MD Debridement Type: Chemical/Enzymatic/Mechanical Agent Used: saline and gauze Level of Consciousness (Pre- Awake and Alert procedure): Pre-procedure Verification/Time Out Yes - 13:50 Taken: Instrument: Other : saline and gauze Bleeding: None Hemostasis Achieved: Pressure Response to Treatment: Procedure was tolerated well Level of Consciousness (Post- Awake and Alert procedure): Post Debridement Measurements of Total Wound Length: (cm) 0.5 Stage: Category/Stage II Width: (cm) 1 Depth: (cm) 0.1 Volume: (cm) 0.039 Character of Wound/Ulcer Post Debridement: Stable Post Procedure Diagnosis Same as Pre-procedure Electronic Signature(s) Signed: 08/31/2020 4:29:45 PM By: Linton Ham MD Signed: 09/01/2020 5:16:15 PM By: Gretta Cool, BSN, RN, CWS, Kim RN, BSN Entered By: Gretta Cool, BSN, RN, CWS, Kim on 08/31/2020 14:11:03 [name], [name]  ([id number]) -------------------------------------------------------------------------------- HPI Details Patient Name: James Prude T. Date of Service: 08/31/2020 12:45 PM Medical Record Number: [id number] Patient Account Number: [id number] Date of Birth/Sex: 1931/03/29 (86 y.o. M) Treating RN: Cornell Barman Primary Care Provider: Harrel Lemon Other Clinician: Referring Provider: Harrel Lemon Treating Provider/Extender: Tito Dine in Treatment: 0 History of Present Illness HPI Description: ADMISSION 08/31/2020 This is an 86 year old independent man who lives at home on his own. His problem started in August when he fell and fractured his right femur. He required an ORIF. He was sent to a rehab facility. At some point in this  timeframe he developed bilateral pressure ulcers on his buttock. He was seen by dermatology Dr. Nehemiah Massed on 06/27/2020 noting the pressure injuries of his bilateral buttocks at the time treatment order for salicylic acid 3% ointment. Saw his primary doctor in the same timeframe. He is currently using a compounded ointment to the area including iodoquinol 1% / 0.25% hydrocortisone/2% niacinamide. I am not really sure how this is been progressing. The wounds are stage II on the bilateral buttocks surrounding the coccyx area. The patient has been changing his dressing himself. He is a very active man in fact had another fall in November suffering a periprosthetic hip fracture requiring additional pinning. He says actually after the second surgery he improved and he no longer has as much pain. The patient is here with his daughter. He also has a son that lives nearby. Past medical history includes systolic congestive heart failure, ischemic cardiomyopathy, right femur fracture and subsequent periprosthetic fracture as described, chronic kidney disease stage III, MGUS and abdominal aortic aneurysm Electronic Signature(s) Signed: 08/31/2020 4:29:45 PM By:  Linton Ham MD Entered By: Linton Ham on 08/31/2020 14:09:33 [name], [name] ([id number]) -------------------------------------------------------------------------------- Physical Exam Details Patient Name: James Prude T. Date of Service: 08/31/2020 12:45 PM Medical Record Number: [id number] Patient Account Number: [id number] Date of Birth/Sex: [birth year] (86 y.o. M) Treating RN: Cornell Barman Primary Care Provider: Harrel Lemon Other Clinician: Referring Provider: Harrel Lemon Treating Provider/Extender: Tito Dine in Treatment: 0 Constitutional Sitting or standing Blood Pressure is within target range for patient.. Pulse regular and within target range for patient.Marland Kitchen Respirations regular, non- labored and within target range.. Temperature is normal and within the target range for the patient.. No change in height.Marland Kitchen appears in no distress. Respiratory Respiratory effort is easy and symmetric bilaterally. Rate is normal at rest and on room air.. Integumentary (Hair, Skin) No primary skin disorder.Marland Kitchen Psychiatric Patient alert and conversational. Notes Wound exam; the patient has mirror-image wounds around his coccyx and his bilateral buttocks. These are clustered especially on the right. Surface of these look healthy. Stage II wounds. There is no evidence of surrounding infection. I used a #3 curette to remove slough and denuded skin from around the margins and surface of the wound. We did not get into subcutaneous tissue. There was no bleeding Electronic Signature(s) Signed: 08/31/2020 4:29:45 PM By: Linton Ham MD Entered By: Linton Ham on 08/31/2020 14:14:30 [name], [name] ([id number]) -------------------------------------------------------------------------------- Physician Orders Details Patient Name: James Prude T. Date of Service: 08/31/2020 12:45 PM Medical Record Number: [id number] Patient Account Number: [id number] Date of Birth/Sex:  1931-02-06 (86 y.o. M) Treating RN: Cornell Barman Primary Care Provider: Harrel Lemon Other Clinician: Referring Provider: Harrel Lemon Treating Provider/Extender: Tito Dine in Treatment: 0 Verbal / Phone Orders: No Diagnosis Coding Wound Cleansing Wound #1 Left Gluteus o Clean wound with Normal Saline. Wound #2 Right Gluteus o Clean wound with Normal Saline. Anesthetic (add to Medication List) Wound #1 Left Gluteus o Topical Lidocaine 4% cream applied to wound bed prior to debridement (In Clinic Only). Wound #2 Right Gluteus o Topical Lidocaine 4% cream applied to wound bed prior to debridement (In Clinic Only). Primary Wound Dressing Wound #1 Left Gluteus o Silver Collagen Wound #2 Right Gluteus o Silver Collagen Secondary Dressing Wound #1 Left Gluteus o Boardered Foam Dressing Wound #2 Right Gluteus o Boardered Foam Dressing Dressing Change Frequency Wound #1 Left Gluteus o Change Dressing Monday, Wednesday, Friday Wound #2 Right Gluteus o Change Dressing Monday, Wednesday, Friday  Follow-up Appointments Wound #1 Left Gluteus o Return Appointment in 1 week. Wound #2 Right Gluteus o Return Appointment in 1 week. Off-Loading Wound #1 Left Gluteus o Other: - Keep pressure off of wounds Wound #2 Right Gluteus o Other: - Keep pressure off of wounds Electronic Signature(s) Signed: 08/31/2020 4:29:45 PM By: Linton Ham MD Signed: 09/01/2020 5:16:15 PM By: Gretta Cool BSN, RN, CWS, Kim RN, BSN [name], James Douglas T. ([id number]) Entered By: Gretta Cool, BSN, RN, CWS, Kim on 08/31/2020 13:57:34 [name], [name] ([id number]) -------------------------------------------------------------------------------- Problem List Details Patient Name: James Prude T. Date of Service: 08/31/2020 12:45 PM Medical Record Number: [id number] Patient Account Number: [id number] Date of Birth/Sex: 1931/03/13 (86 y.o. M) Treating RN: Cornell Barman Primary Care  Provider: Harrel Lemon Other Clinician: Referring Provider: Harrel Lemon Treating Provider/Extender: Tito Dine in Treatment: 0 Active Problems ICD-10 Encounter Code Description Active Date MDM Diagnosis L89.312 Pressure ulcer of right buttock, stage 2 08/31/2020 No Yes L89.322 Pressure ulcer of left buttock, stage 2 08/31/2020 No Yes Inactive Problems Resolved Problems Electronic Signature(s) Signed: 08/31/2020 4:29:45 PM By: Linton Ham MD Entered By: Linton Ham on 08/31/2020 14:04:19 [name], [name] ([id number]) -------------------------------------------------------------------------------- Progress Note Details Patient Name: James Prude T. Date of Service: 08/31/2020 12:45 PM Medical Record Number: [id number] Patient Account Number: [id number] Date of Birth/Sex: 1931/02/22 (86 y.o. M) Treating RN: Cornell Barman Primary Care Provider: Harrel Lemon Other Clinician: Referring Provider: Harrel Lemon Treating Provider/Extender: Tito Dine in Treatment: 0 Subjective Chief Complaint Information obtained from Patient 08/31/2020; patient is here for review of wounds on his bilateral buttocks surrounding his coccyx History of Present Illness (HPI) ADMISSION 08/31/2020 This is an 86 year old independent man who lives at home on his own. His problem started in August when he fell and fractured his right femur. He required an ORIF. He was sent to a rehab facility. At some point in this timeframe he developed bilateral pressure ulcers on his buttock. He was seen by dermatology Dr. Nehemiah Massed on 06/27/2020 noting the pressure injuries of his bilateral buttocks at the time treatment order for salicylic acid 3% ointment. Saw his primary doctor in the same timeframe. He is currently using a compounded ointment to the area including iodoquinol 1% / 0.25% hydrocortisone/2% niacinamide. I am not really sure how this is been progressing. The wounds are  stage II on the bilateral buttocks surrounding the coccyx area. The patient has been changing his dressing himself. He is a very active man in fact had another fall in November suffering a periprosthetic hip fracture requiring additional pinning. He says actually after the second surgery he improved and he no longer has as much pain. The patient is here with his daughter. He also has a son that lives nearby. Past medical history includes systolic congestive heart failure, ischemic cardiomyopathy, right femur fracture and subsequent periprosthetic fracture as described, chronic kidney disease stage III, MGUS and abdominal aortic aneurysm Patient History Information obtained from Patient. Allergies morphine (Severity: Severe, Reaction: anaphylaxis), oxycodone, carvedilol (Reaction: dizziness), Iodinated Contrast Media (Reaction: rash), lisinopril (Severity: Mild, Reaction: cough) Family History Diabetes - Mother, No family history of Cancer, Heart Disease, Hereditary Spherocytosis, Hypertension, Kidney Disease, Lung Disease, Seizures, Stroke, Thyroid Problems, Tuberculosis. Social History Former smoker - quit 57 years ago, Alcohol Use - Rarely, Drug Use - No History, Caffeine Use - Daily. Medical History Eyes Denies history of Cataracts, Glaucoma, Optic Neuritis Ear/Nose/Mouth/Throat Denies history of Chronic sinus problems/congestion, Middle ear problems Hematologic/Lymphatic Denies history of Anemia, Hemophilia, Human Immunodeficiency Virus,  Lymphedema, Sickle Cell Disease Respiratory Denies history of Aspiration, Asthma, Chronic Obstructive Pulmonary Disease (COPD), Pneumothorax, Sleep Apnea, Tuberculosis Cardiovascular Patient has history of Coronary Artery Disease, Hypertension Denies history of Angina, Arrhythmia, Congestive Heart Failure, Deep Vein Thrombosis, Hypotension, Myocardial Infarction, Peripheral Arterial Disease, Peripheral Venous Disease, Phlebitis,  Vasculitis Gastrointestinal Denies history of Cirrhosis , Colitis, Crohn s, Hepatitis A, Hepatitis B, Hepatitis C Endocrine Denies history of Type I Diabetes, Type II Diabetes Immunological Denies history of Lupus Erythematosus, Raynaud s, Scleroderma Integumentary (Skin) Patient has history of History of pressure wounds Denies history of History of Burn Musculoskeletal Denies history of Gout, Rheumatoid Arthritis, Osteoarthritis, Osteomyelitis Neurologic Denies history of Dementia, Neuropathy, Quadriplegia, Paraplegia, Seizure Disorder Psychiatric Denies history of Anorexia/bulimia, Confinement Anxiety [name], James T. ([id number]) Medical And Surgical History Notes Genitourinary stage 3 chronic kidney disease Review of Systems (ROS) Constitutional Symptoms (General Health) Denies complaints or symptoms of Fatigue, Fever, Chills, Marked Weight Change. Eyes Complains or has symptoms of Glasses / Contacts - glasses. Denies complaints or symptoms of Dry Eyes, Vision Changes. Ear/Nose/Mouth/Throat Denies complaints or symptoms of Difficult clearing ears, Sinusitis. Hematologic/Lymphatic Denies complaints or symptoms of Bleeding / Clotting Disorders, Human Immunodeficiency Virus. Respiratory Denies complaints or symptoms of Chronic or frequent coughs, Shortness of Breath. Cardiovascular Denies complaints or symptoms of Chest pain, LE edema. Gastrointestinal Denies complaints or symptoms of Frequent diarrhea, Nausea, Vomiting. Endocrine Denies complaints or symptoms of Hepatitis, Thyroid disease, Polydypsia (Excessive Thirst). Genitourinary Denies complaints or symptoms of Kidney failure/ Dialysis, Incontinence/dribbling. Immunological Denies complaints or symptoms of Hives, Itching. Integumentary (Skin) Complains or has symptoms of Wounds. Denies complaints or symptoms of Bleeding or bruising tendency, Breakdown, Swelling. Musculoskeletal Denies complaints or symptoms of  Muscle Pain, Muscle Weakness. Neurologic Denies complaints or symptoms of Numbness/parasthesias, Focal/Weakness. Psychiatric Denies complaints or symptoms of Anxiety, Claustrophobia. Objective Constitutional Sitting or standing Blood Pressure is within target range for patient.. Pulse regular and within target range for patient.Marland Kitchen Respirations regular, non- labored and within target range.. Temperature is normal and within the target range for the patient.. No change in height.Marland Kitchen appears in no distress. Vitals Time Taken: 1:12 PM, Height: 71 in, Source: Stated, Weight: 164 lbs, Source: Stated, BMI: 22.9, Temperature: 97.5 F, Pulse: 75 bpm, Respiratory Rate: 18 breaths/min, Blood Pressure: 125/83 mmHg. Respiratory Respiratory effort is easy and symmetric bilaterally. Rate is normal at rest and on room air.Marland Kitchen Psychiatric Patient alert and conversational. General Notes: Wound exam; the patient has mirror-image wounds around his coccyx and his bilateral buttocks. These are clustered especially on the right. Surface of these look healthy. Stage II wounds. There is no evidence of surrounding infection. I used a #3 curette to remove slough and denuded skin from around the margins and surface of the wound. We did not get into subcutaneous tissue. There was no bleeding Integumentary (Hair, Skin) No primary skin disorder.. Wound #1 status is Open. Original cause of wound was Pressure Injury. The wound is located on the Left Gluteus. The wound measures 2.5cm length x 2.2cm width x 0.1cm depth; 4.32cm^2 area and 0.432cm^3 volume. There is Fat Layer (Subcutaneous Tissue) exposed. There is no tunneling or undermining noted. There is a small amount of serous drainage noted. There is large (67-100%) red, pink granulation within the wound bed. There is no necrotic tissue within the wound bed. Wound #2 status is Open. Original cause of wound was Pressure Injury. The wound is located on the Right Gluteus. The  wound measures 0.5cm length x 1cm width x 0.1cm  depth; 0.393cm^2 area and 0.039cm^3 volume. There is Fat Layer (Subcutaneous Tissue) exposed. There is no tunneling or undermining noted. There is a small amount of serous drainage noted. There is large (67-100%) red, pink granulation within the wound bed. There is no necrotic tissue within the wound bed. DONYA, TOMARO ([id number]) Assessment Active Problems ICD-10 Pressure ulcer of right buttock, stage 2 Pressure ulcer of left buttock, stage 2 Procedures Wound #1 Pre-procedure diagnosis of Wound #1 is a Pressure Ulcer located on the Left Gluteus . There was a Selective/Open Wound Skin/Dermis Debridement with a total area of 5.5 sq cm performed by Ricard Dillon, MD. With the following instrument(s): Curette to remove Non- Viable tissue/material. Material removed includes Slough and Skin: Dermis and. No specimens were taken. A time out was conducted at 13:54, prior to the start of the procedure. There was no bleeding. The procedure was tolerated well. Post Debridement Measurements: 2.5cm length x 2.2cm width x 0.1cm depth; 0.432cm^3 volume. Post debridement Stage noted as Category/Stage II. Character of Wound/Ulcer Post Debridement is stable. Post procedure Diagnosis Wound #1: Same as Pre-Procedure Wound #2 Pre-procedure diagnosis of Wound #2 is a Pressure Ulcer located on the Right Gluteus . There was a Chemical/Enzymatic/Mechanical debridement performed by Ricard Dillon, MD. With the following instrument(s): saline and gauze. Other agent used was saline and gauze. A time out was conducted at 13:50, prior to the start of the procedure. There was no bleeding. The procedure was tolerated well. Post Debridement Measurements: 0.5cm length x 1cm width x 0.1cm depth; 0.039cm^3 volume. Post debridement Stage noted as Category/Stage II. Character of Wound/Ulcer Post Debridement is stable. Post procedure Diagnosis Wound #2: Same as  Pre-Procedure Plan Wound Cleansing: Wound #1 Left Gluteus: Clean wound with Normal Saline. Wound #2 Right Gluteus: Clean wound with Normal Saline. Anesthetic (add to Medication List): Wound #1 Left Gluteus: Topical Lidocaine 4% cream applied to wound bed prior to debridement (In Clinic Only). Wound #2 Right Gluteus: Topical Lidocaine 4% cream applied to wound bed prior to debridement (In Clinic Only). Primary Wound Dressing: Wound #1 Left Gluteus: Silver Collagen Wound #2 Right Gluteus: Silver Collagen Secondary Dressing: Wound #1 Left Gluteus: Boardered Foam Dressing Wound #2 Right Gluteus: Boardered Foam Dressing Dressing Change Frequency: Wound #1 Left Gluteus: Change Dressing Monday, Wednesday, Friday Wound #2 Right Gluteus: Change Dressing Monday, Wednesday, Friday Follow-up Appointments: Wound #1 Left Gluteus: Return Appointment in 1 week. Wound #2 Right Gluteus: Return Appointment in 1 week. Off-Loading: Wound #1 Left Gluteus: Other: - Keep pressure off of wounds Girdler, Marcellous T. ([id number]) Wound #2 Right Gluteus: Other: - Keep pressure off of wounds 1. Bilateral stage II pressure ulcers as described 2. I am not familiar with the compounded ointment provided. In any case this does not appear to be healing #3 we will use moistened collagen with border foam. 4. We had a fairly long discussion about how he would place this dressing. As mentioned he lives alone. His daughter is not willing to do this. She will speak to her brother who also lives nearby. Because he goes to outpatient physical therapy he would not be eligible for home health nursing. 5. No evidence of surrounding infection. 6. I asked the patient to speak to his primary physician about screening for underlying osteoporosis I spent 30 minutes in review of this patient's past medical history, face-to-face evaluation and preparation of this record Electronic Signature(s) Signed: 08/31/2020 4:29:45 PM  By: Linton Ham MD Entered By: Linton Ham on 08/31/2020  14:17:20 [name], [name] ([id number]) -------------------------------------------------------------------------------- ROS/PFSH Details Patient Name: NIKOLAS, CASHER T. Date of Service: 08/31/2020 12:45 PM Medical Record Number: [id number] Patient Account Number: [id number] Date of Birth/Sex: 02-Aug-1931 (86 y.o. M) Treating RN: Dolan Amen Primary Care Provider: Harrel Lemon Other Clinician: Referring Provider: Harrel Lemon Treating Provider/Extender: Tito Dine in Treatment: 0 Information Obtained From Patient Constitutional Symptoms (General Health) Complaints and Symptoms: Negative for: Fatigue; Fever; Chills; Marked Weight Change Eyes Complaints and Symptoms: Positive for: Glasses / Contacts - glasses Negative for: Dry Eyes; Vision Changes Medical History: Negative for: Cataracts; Glaucoma; Optic Neuritis Ear/Nose/Mouth/Throat Complaints and Symptoms: Negative for: Difficult clearing ears; Sinusitis Medical History: Negative for: Chronic sinus problems/congestion; Middle ear problems Hematologic/Lymphatic Complaints and Symptoms: Negative for: Bleeding / Clotting Disorders; Human Immunodeficiency Virus Medical History: Negative for: Anemia; Hemophilia; Human Immunodeficiency Virus; Lymphedema; Sickle Cell Disease Respiratory Complaints and Symptoms: Negative for: Chronic or frequent coughs; Shortness of Breath Medical History: Negative for: Aspiration; Asthma; Chronic Obstructive Pulmonary Disease (COPD); Pneumothorax; Sleep Apnea; Tuberculosis Cardiovascular Complaints and Symptoms: Negative for: Chest pain; LE edema Medical History: Positive for: Coronary Artery Disease; Hypertension Negative for: Angina; Arrhythmia; Congestive Heart Failure; Deep Vein Thrombosis; Hypotension; Myocardial Infarction; Peripheral Arterial Disease; Peripheral Venous Disease; Phlebitis;  Vasculitis Gastrointestinal Complaints and Symptoms: Negative for: Frequent diarrhea; Nausea; Vomiting Medical History: Negative for: Cirrhosis ; Colitis; Crohnos; Hepatitis A; Hepatitis B; Hepatitis C Endocrine Spano, Ranald T. ([id number]) Complaints and Symptoms: Negative for: Hepatitis; Thyroid disease; Polydypsia (Excessive Thirst) Medical History: Negative for: Type I Diabetes; Type II Diabetes Genitourinary Complaints and Symptoms: Negative for: Kidney failure/ Dialysis; Incontinence/dribbling Medical History: Past Medical History Notes: stage 3 chronic kidney disease Immunological Complaints and Symptoms: Negative for: Hives; Itching Medical History: Negative for: Lupus Erythematosus; Raynaudos; Scleroderma Integumentary (Skin) Complaints and Symptoms: Positive for: Wounds Negative for: Bleeding or bruising tendency; Breakdown; Swelling Medical History: Positive for: History of pressure wounds Negative for: History of Burn Musculoskeletal Complaints and Symptoms: Negative for: Muscle Pain; Muscle Weakness Medical History: Negative for: Gout; Rheumatoid Arthritis; Osteoarthritis; Osteomyelitis Neurologic Complaints and Symptoms: Negative for: Numbness/parasthesias; Focal/Weakness Medical History: Negative for: Dementia; Neuropathy; Quadriplegia; Paraplegia; Seizure Disorder Psychiatric Complaints and Symptoms: Negative for: Anxiety; Claustrophobia Medical History: Negative for: Anorexia/bulimia; Confinement Anxiety Oncologic Immunizations Pneumococcal Vaccine: Received Pneumococcal Vaccination: Yes Implantable Devices None Family and Social History TYSEN, ROESLER ([id number]) Cancer: No; Diabetes: Yes - Mother; Heart Disease: No; Hereditary Spherocytosis: No; Hypertension: No; Kidney Disease: No; Lung Disease: No; Seizures: No; Stroke: No; Thyroid Problems: No; Tuberculosis: No; Former smoker - quit 57 years ago; Alcohol Use: Rarely; Drug Use:  No History; Caffeine Use: Daily Electronic Signature(s) Signed: 08/31/2020 4:29:45 PM By: Linton Ham MD Signed: 08/31/2020 5:15:03 PM By: Georges Mouse, Minus Breeding RN Entered By: Georges Mouse, Minus Breeding on 08/31/2020 13:24:29 Schembri, [name] ([id number]) -------------------------------------------------------------------------------- SuperBill Details Patient Name: James Prude T. Date of Service: 08/31/2020 Medical Record Number: [id number] Patient Account Number: [id number] Date of Birth/Sex: 1931-07-09 (86 y.o. M) Treating RN: Cornell Barman Primary Care Provider: Harrel Lemon Other Clinician: Referring Provider: Harrel Lemon Treating Provider/Extender: Tito Dine in Treatment: 0 Diagnosis Coding ICD-10 Codes Code Description [phone] Pressure ulcer of right buttock, stage 2 L89.322 Pressure ulcer of left buttock, stage 2 Facility Procedures CPT4 Code: [id number] Description: 41962 - WOUND CARE VISIT-LEV 3 EST PT Modifier: Quantity: 1 CPT4 Code: [id number] Description: 11941 - DEBRIDE W/O ANES NON SELECT Modifier: Quantity: 1 CPT4 Code: 74081448 Description: 18563 - DEBRIDE WOUND 1ST 20 SQ CM OR < Modifier:  Quantity: 1 CPT4 Code: Description: ICD-10 Diagnosis Description P37.902 Pressure ulcer of right buttock, stage 2 L89.322 Pressure ulcer of left buttock, stage 2 Modifier: Quantity: Physician Procedures CPT4 Code: 4097353 Description: WC PHYS LEVEL 3 o NEW PT Modifier: 25 Quantity: 1 CPT4 Code: Description: ICD-10 Diagnosis Description G99.242 Pressure ulcer of right buttock, stage 2 L89.322 Pressure ulcer of left buttock, stage 2 Modifier: Quantity: CPT4 Code: 6834196 Description: 22297 - WC PHYS DEBR WO ANESTH 20 SQ CM Modifier: Quantity: 1 CPT4 Code: Description: ICD-10 Diagnosis Description L89.312 Pressure ulcer of right buttock, stage 2 L89.322 Pressure ulcer of left buttock, stage 2 Modifier: Quantity: Electronic Signature(s) Signed:  08/31/2020 4:29:45 PM By: Linton Ham MD Entered By: Linton Ham on 08/31/2020 14:13:54

## 2020-09-02 NOTE — Progress Notes (Signed)
GERMAINE, SHENKER ([id number]) Visit Report for 08/31/2020 Allergy List Details Patient Name: LINDBERG, ZENON. Date of Service: 08/31/2020 12:45 PM Medical Record Number: [id number] Patient Account Number: [id number] Date of Birth/Sex: 1931/07/15 (86 y.o. M) Treating RN: Dolan Amen Primary Care Devanee Pomplun: Harrel Lemon Other Clinician: Referring Doak Mah: Harrel Lemon Treating Bathsheba Durrett/Extender: Ricard Dillon Weeks in Treatment: 0 Allergies Active Allergies morphine Reaction: anaphylaxis Severity: Severe oxycodone carvedilol Reaction: dizziness Iodinated Contrast Media Reaction: rash lisinopril Reaction: cough Severity: Mild Allergy Notes Electronic Signature(s) Signed: 08/31/2020 5:15:03 PM By: Georges Mouse, Minus Breeding RN Entered By: Georges Mouse, Minus Breeding on 08/31/2020 13:14:11 Foutz, [name] ([id number]) -------------------------------------------------------------------------------- Arrival Information Details Patient Name: [name] Date of Service: 08/31/2020 12:45 PM Medical Record Number: [id number] Patient Account Number: [id number] Date of Birth/Sex: [birth year] (86 y.o. M) Treating RN: Dolan Amen Primary Care Mattilyn Crites: Harrel Lemon Other Clinician: Referring Reyna Lorenzi: Harrel Lemon Treating Marguriete Wootan/Extender: Tito Dine in Treatment: 0 Visit Information Patient Arrived: Walker Arrival Time: 13:10 Accompanied By: self Transfer Assistance: None Patient Identification Verified: Yes Secondary Verification Process Completed: Yes Electronic Signature(s) Signed: 08/31/2020 5:15:03 PM By: Georges Mouse, Minus Breeding RN Entered By: Georges Mouse, Minus Breeding on 08/31/2020 13:11:06 Harig, [name] ([id number]) -------------------------------------------------------------------------------- Clinic Level of Care Assessment Details Patient Name: [name] Date of Service: 08/31/2020 12:45 PM Medical Record Number:  [id number] Patient Account Number: [id number] Date of Birth/Sex: 08-04-1931 (86 y.o. M) Treating RN: Cornell Barman Primary Care Yina Riviere: Harrel Lemon Other Clinician: Referring Terianne Thaker: Harrel Lemon Treating Vola Beneke/Extender: Tito Dine in Treatment: 0 Clinic Level of Care Assessment Items TOOL 1 Quantity Score []  - Use when EandM and Procedure is performed on INITIAL visit 0 ASSESSMENTS - Nursing Assessment / Reassessment X - General Physical Exam (combine w/ comprehensive assessment (listed just below) when performed on new 1 20 pt. evals) X- 1 25 Comprehensive Assessment (HX, ROS, Risk Assessments, Wounds Hx, etc.) ASSESSMENTS - Wound and Skin Assessment / Reassessment []  - Dermatologic / Skin Assessment (not related to wound area) 0 ASSESSMENTS - Ostomy and/or Continence Assessment and Care []  - Incontinence Assessment and Management 0 []  - 0 Ostomy Care Assessment and Management (repouching, etc.) PROCESS - Coordination of Care X - Simple Patient / Family Education for ongoing care 1 15 []  - 0 Complex (extensive) Patient / Family Education for ongoing care X- 1 10 Staff obtains Consents, Records, Test Results / Process Orders []  - 0 Staff telephones HHA, Nursing Homes / Clarify orders / etc []  - 0 Routine Transfer to another Facility (non-emergent condition) []  - 0 Routine Hospital Admission (non-emergent condition) X- 1 15 New Admissions / Biomedical engineer / Ordering NPWT, Apligraf, etc. []  - 0 Emergency Hospital Admission (emergent condition) PROCESS - Special Needs []  - Pediatric / Minor Patient Management 0 []  - 0 Isolation Patient Management []  - 0 Hearing / Language / Visual special needs []  - 0 Assessment of Community assistance (transportation, D/C planning, etc.) []  - 0 Additional assistance / Altered mentation []  - 0 Support Surface(s) Assessment (bed, cushion, seat, etc.) INTERVENTIONS - Miscellaneous []  - External ear exam  0 []  - 0 Patient Transfer (multiple staff / Civil Service fast streamer / Similar devices) []  - 0 Simple Staple / Suture removal (25 or less) []  - 0 Complex Staple / Suture removal (26 or more) []  - 0 Hypo/Hyperglycemic Management (do not check if billed separately) []  - 0 Ankle / Brachial Index (ABI) - do not check if billed separately Has the patient been seen at the  hospital within the last three years: Yes Total Score: 85 Level Of Care: New/Established - Level 3 Portnoy, Zhane T. ([id number]) Electronic Signature(s) Signed: 09/01/2020 5:16:15 PM By: Gretta Cool, BSN, RN, CWS, Kim RN, BSN Entered By: Gretta Cool, BSN, RN, CWS, Kim on 08/31/2020 13:59:06 Meadowcroft, [name] ([id number]) -------------------------------------------------------------------------------- Encounter Discharge Information Details Patient Name: [name] Date of Service: 08/31/2020 12:45 PM Medical Record Number: [id number] Patient Account Number: [id number] Date of Birth/Sex: 1931/05/26 (86 y.o. M) Treating RN: Cornell Barman Primary Care Janellie Tennison: Harrel Lemon Other Clinician: Referring Shell Blanchette: Harrel Lemon Treating Tim Wilhide/Extender: Tito Dine in Treatment: 0 Encounter Discharge Information Items Post Procedure Vitals Discharge Condition: Stable Temperature (F): 97.5 Ambulatory Status: Ambulatory Pulse (bpm): 75 Discharge Destination: Home Respiratory Rate (breaths/min): 18 Transportation: Private Auto Blood Pressure (mmHg): 125/83 Accompanied By: self Schedule Follow-up Appointment: Yes Clinical Summary of Care: Electronic Signature(s) Signed: 09/01/2020 5:16:15 PM By: Gretta Cool, BSN, RN, CWS, Kim RN, BSN Entered By: Gretta Cool, BSN, RN, CWS, Kim on 08/31/2020 14:00:41 Pitsenbarger, [name] ([id number]) -------------------------------------------------------------------------------- Lower Extremity Assessment Details Patient Name: [name] Date of Service: 08/31/2020 12:45 PM Medical Record Number:  [id number] Patient Account Number: [id number] Date of Birth/Sex: 07-28-1931 (86 y.o. M) Treating RN: Dolan Amen Primary Care Brietta Manso: Harrel Lemon Other Clinician: Referring Latron Ribas: Harrel Lemon Treating Floyde Dingley/Extender: Tito Dine in Treatment: 0 Electronic Signature(s) Signed: 08/31/2020 5:15:03 PM By: Georges Mouse, Minus Breeding RN Entered By: Georges Mouse, Minus Breeding on 08/31/2020 13:37:54 Sakuma, [name] ([id number]) -------------------------------------------------------------------------------- Multi Wound Chart Details Patient Name: [name] Date of Service: 08/31/2020 12:45 PM Medical Record Number: [id number] Patient Account Number: [id number] Date of Birth/Sex: 1931-08-20 (86 y.o. M) Treating RN: Cornell Barman Primary Care Keaisha Sublette: Harrel Lemon Other Clinician: Referring Peretz Thieme: Harrel Lemon Treating Zaahir Pickney/Extender: Tito Dine in Treatment: 0 Vital Signs Height(in): 71 Pulse(bpm): 75 Weight(lbs): 164 Blood Pressure(mmHg): 125/83 Body Mass Index(BMI): 23 Temperature(F): 97.5 Respiratory Rate(breaths/min): 18 Photos: [N/A:N/A] Wound Location: Left Gluteus Right Gluteus N/A Wounding Event: Pressure Injury Pressure Injury N/A Primary Etiology: Pressure Ulcer Pressure Ulcer N/A Comorbid History: Coronary Artery Disease, Coronary Artery Disease, N/A Hypertension, History of pressure Hypertension, History of pressure wounds wounds Date Acquired: 05/18/2020 05/18/2020 N/A Weeks of Treatment: 0 0 N/A Wound Status: Open Open N/A Clustered Wound: Yes No N/A Clustered Quantity: 3 N/A N/A Measurements L x W x D (cm) 2.5x2.2x0.1 0.5x1x0.1 N/A Area (cm) : 4.32 0.393 N/A Volume (cm) : 0.432 0.039 N/A % Reduction in Area: 0.00% 0.00% N/A % Reduction in Volume: 0.00% 0.00% N/A Classification: Category/Stage II Category/Stage II N/A Exudate Amount: Small Small N/A Exudate Type: Serous Serous N/A Exudate Color: amber amber  N/A Granulation Amount: Large (67-100%) Large (67-100%) N/A Granulation Quality: Red, Pink Red, Pink N/A Necrotic Amount: None Present (0%) None Present (0%) N/A Exposed Structures: Fat Layer (Subcutaneous Tissue): Fat Layer (Subcutaneous Tissue): N/A Yes Yes Fascia: No Fascia: No Tendon: No Tendon: No Muscle: No Muscle: No Joint: No Joint: No Bone: No Bone: No Epithelialization: N/A None N/A Debridement: Debridement - Selective/Open Chemical/Enzymatic/Mechanical N/A Wound Pre-procedure Verification/Time 13:54 13:50 N/A Out Taken: Tissue Debrided: Slough N/A N/A Level: Non-Viable Tissue N/A N/A Debridement Area (sq cm): 5.5 N/A N/A Instrument: Curette Other(saline and gauze) N/A Bleeding: None None N/A Hemostasis Achieved: Pressure Pressure N/A Debridement Treatment Procedure was tolerated well Procedure was tolerated well N/A Response: Kesling, Hicks T. ([id number]) Post Debridement 2.5x2.2x0.1 0.5x1x0.1 N/A Measurements L x W x D (cm) Post Debridement Volume: 0.432 0.039 N/A (cm) Post Debridement Stage:  Category/Stage II Category/Stage II N/A Procedures Performed: Debridement N/A N/A Treatment Notes Wound #1 (Left Gluteus) Notes Prisma Ag, saline, bordered foam dressing Wound #2 (Right Gluteus) Notes Prisma Ag, saline, bordered foam dressing Electronic Signature(s) Signed: 08/31/2020 4:29:45 PM By: Linton Ham MD Entered By: Linton Ham on 08/31/2020 14:04:44 [place], [name] ([id number]) -------------------------------------------------------------------------------- [place] Details Patient Name: [name] Date of Service: 08/31/2020 12:45 PM Medical Record Number: [id number] Patient Account Number: [id number] Date of Birth/Sex: 07/11/1931 (86 y.o. M) Treating RN: Cornell Barman Primary Care Autymn Omlor: Harrel Lemon Other Clinician: Referring Branton Einstein: Harrel Lemon Treating Tashera Montalvo/Extender: Tito Dine in  Treatment: 0 Active Inactive Abuse / Safety / Falls / Self Care Management Nursing Diagnoses: History of Falls Goals: Patient/caregiver will verbalize understanding of skin care regimen Date Initiated: 08/31/2020 Target Resolution Date: 10/02/2019 Goal Status: Active Interventions: Assess fall risk on admission and as needed Notes: Orientation to the Wound Care Program Nursing Diagnoses: Knowledge deficit related to the wound healing center program Goals: Patient/caregiver will verbalize understanding of the [facility] Program Date Initiated: 08/31/2020 Target Resolution Date: 10/01/2020 Goal Status: Active Interventions: Provide education on orientation to the wound center Notes: Wound/Skin Impairment Nursing Diagnoses: Knowledge deficit related to ulceration/compromised skin integrity Goals: Patient/caregiver will verbalize understanding of skin care regimen Date Initiated: 08/31/2020 Target Resolution Date: 10/01/2020 Goal Status: Active Ulcer/skin breakdown will have a volume reduction of 30% by week 4 Date Initiated: 08/31/2020 Target Resolution Date: 10/01/2020 Goal Status: Active Interventions: Assess ulceration(s) every visit Treatment Activities: Skin care regimen initiated : 08/31/2020 Notes: Electronic Signature(s) Signed: 09/01/2020 5:16:15 PM By: Gretta Cool, BSN, RN, CWS, Kim RN, BSN Entered By: Gretta Cool, BSN, RN, CWS, Kim on 08/31/2020 13:50:14 Macbride, [name] ([id number]) Cummiskey, [name] ([id number]) -------------------------------------------------------------------------------- Pain Assessment Details Patient Name: [name] Date of Service: 08/31/2020 12:45 PM Medical Record Number: [id number] Patient Account Number: [id number] Date of Birth/Sex: 1931/07/22 (86 y.o. M) Treating RN: Dolan Amen Primary Care Talecia Sherlin: Harrel Lemon Other Clinician: Referring Mitul Hallowell: Harrel Lemon Treating Skylan Gift/Extender: Tito Dine  in Treatment: 0 Active Problems Location of Pain Severity and Description of Pain Patient Has Paino Yes Site Locations Pain Location: Pain in Ulcers Rate the pain. Current Pain Level: 7 Pain Management and Medication Current Pain Management: Electronic Signature(s) Signed: 08/31/2020 5:15:03 PM By: Georges Mouse, Minus Breeding RN Entered By: Georges Mouse, Kenia on 08/31/2020 13:12:04 Aldous, [name] ([id number]) -------------------------------------------------------------------------------- Patient/Caregiver Education Details Patient Name: [name] Date of Service: 08/31/2020 12:45 PM Medical Record Number: [id number] Patient Account Number: [id number] Date of Birth/Gender: 01/22/1931 (86 y.o. M) Treating RN: Cornell Barman Primary Care Physician: Harrel Lemon Other Clinician: Referring Physician: Harrel Lemon Treating Physician/Extender: Tito Dine in Treatment: 0 Education Assessment Education Provided To: Patient Education Topics Provided Welcome To [facility]: Handouts: Welcome To [facility] Methods: Demonstration, Explain/Verbal Responses: State content correctly Wound/Skin Impairment: Handouts: Caring for Your Ulcer Methods: Demonstration, Explain/Verbal Responses: State content correctly Electronic Signature(s) Signed: 09/01/2020 5:16:15 PM By: Gretta Cool, BSN, RN, CWS, Kim RN, BSN Entered By: Gretta Cool, BSN, RN, CWS, Kim on 08/31/2020 13:59:30 Brouwer, [name] ([id number]) -------------------------------------------------------------------------------- Wound Assessment Details Patient Name: [name] Date of Service: 08/31/2020 12:45 PM Medical Record Number: [id number] Patient Account Number: [id number] Date of Birth/Sex: 1931/02/15 (86 y.o. M) Treating RN: Dolan Amen Primary Care Arelys Glassco: Harrel Lemon Other Clinician: Referring Moriah Loughry: Harrel Lemon Treating Jguadalupe Opiela/Extender: Tito Dine in  Treatment: 0 Wound Status Wound Number: 1  Primary Pressure Ulcer Etiology: Wound Location: Left Gluteus Wound Status: Open Wounding Event: Pressure Injury Comorbid Coronary Artery Disease, Hypertension, History of Date Acquired: 05/18/2020 History: pressure wounds Weeks Of Treatment: 0 Clustered Wound: Yes Photos Wound Measurements Length: (cm) 2.5 Width: (cm) 2.2 Depth: (cm) 0.1 Clustered Quantity: 3 Area: (cm) 4.32 Volume: (cm) 0.432 % Reduction in Area: 0% % Reduction in Volume: 0% Tunneling: No Undermining: No Wound Description Classification: Category/Stage II Exudate Amount: Small Exudate Type: Serous Exudate Color: amber Wound Bed Granulation Amount: Large (67-100%) Exposed Structure Granulation Quality: Red, Pink Fascia Exposed: No Necrotic Amount: None Present (0%) Fat Layer (Subcutaneous Tissue) Exposed: Yes Tendon Exposed: No Muscle Exposed: No Joint Exposed: No Bone Exposed: No Treatment Notes Wound #1 (Left Gluteus) Notes Prisma Ag, saline, bordered foam dressing Electronic Signature(s) Signed: 08/31/2020 5:15:03 PM By: Georges Mouse, Minus Breeding RN Badeaux, Pressley T. ([id number]) Entered By: Georges Mouse, Minus Breeding on 08/31/2020 13:36:06 Rickett, [name] ([id number]) -------------------------------------------------------------------------------- Wound Assessment Details Patient Name: [name] Date of Service: 08/31/2020 12:45 PM Medical Record Number: [id number] Patient Account Number: [id number] Date of Birth/Sex: Jan 06, 1931 (86 y.o. M) Treating RN: Dolan Amen Primary Care Dayanira Giovannetti: Harrel Lemon Other Clinician: Referring Katheleen Stella: Harrel Lemon Treating Jahara Dail/Extender: Tito Dine in Treatment: 0 Wound Status Wound Number: 2 Primary Pressure Ulcer Etiology: Wound Location: Right Gluteus Wound Status: Open Wounding Event: Pressure Injury Comorbid Coronary Artery Disease, Hypertension, History of Date Acquired:  05/18/2020 History: pressure wounds Weeks Of Treatment: 0 Clustered Wound: No Photos Wound Measurements Length: (cm) 0.5 % Width: (cm) 1 % Depth: (cm) 0.1 E Area: (cm) 0.393 Volume: (cm) 0.039 Reduction in Area: 0% Reduction in Volume: 0% pithelialization: None Tunneling: No Undermining: No Wound Description Classification: Category/Stage II Exudate Amount: Small Exudate Type: Serous Exudate Color: amber Foul Odor After Cleansing: No Slough/Fibrino No Wound Bed Granulation Amount: Large (67-100%) Exposed Structure Granulation Quality: Red, Pink Fascia Exposed: No Necrotic Amount: None Present (0%) Fat Layer (Subcutaneous Tissue) Exposed: Yes Tendon Exposed: No Muscle Exposed: No Joint Exposed: No Bone Exposed: No Treatment Notes Wound #2 (Right Gluteus) Notes Prisma Ag, saline, bordered foam dressing Electronic Signature(s) Signed: 08/31/2020 5:15:03 PM By: Georges Mouse, Minus Breeding RN Bieler, Leiland T. ([id number]) Entered By: Georges Mouse, Minus Breeding on 08/31/2020 13:37:02 Yeske, [name] ([id number]) -------------------------------------------------------------------------------- Vitals Details Patient Name: [name] Date of Service: 08/31/2020 12:45 PM Medical Record Number: [id number] Patient Account Number: [id number] Date of Birth/Sex: Feb 02, 1931 (86 y.o. M) Treating RN: Dolan Amen Primary Care Melina Mosteller: Harrel Lemon Other Clinician: Referring Denora Wysocki: Harrel Lemon Treating Zebulan Hinshaw/Extender: Tito Dine in Treatment: 0 Vital Signs Time Taken: 13:12 Temperature (F): 97.5 Height (in): 71 Pulse (bpm): 75 Source: Stated Respiratory Rate (breaths/min): 18 Weight (lbs): 164 Blood Pressure (mmHg): 125/83 Source: Stated Reference Range: 80 - 120 mg / dl Body Mass Index (BMI): 22.9 Electronic Signature(s) Signed: 08/31/2020 5:15:03 PM By: Georges Mouse, Minus Breeding RN Entered By: Georges Mouse, Minus Breeding on 08/31/2020 13:12:49

## 2020-09-06 ENCOUNTER — Emergency Department: Payer: PPO

## 2020-09-06 ENCOUNTER — Inpatient Hospital Stay
Admission: EM | Admit: 2020-09-06 | Discharge: 2020-09-10 | DRG: 291 | Disposition: A | Discharge status: Home / Self Care | Payer: PPO | Attending: Internal Medicine | Admitting: Internal Medicine

## 2020-09-06 ENCOUNTER — Other Ambulatory Visit: Payer: Self-pay

## 2020-09-06 DIAGNOSIS — E8770 Fluid overload, unspecified: Secondary | ICD-10-CM | POA: Diagnosis not present

## 2020-09-06 DIAGNOSIS — J9811 Atelectasis: Secondary | ICD-10-CM | POA: Diagnosis present

## 2020-09-06 DIAGNOSIS — Z79899 Other long term (current) drug therapy: Secondary | ICD-10-CM

## 2020-09-06 DIAGNOSIS — Z7982 Long term (current) use of aspirin: Secondary | ICD-10-CM | POA: Diagnosis not present

## 2020-09-06 DIAGNOSIS — R0602 Shortness of breath: Secondary | ICD-10-CM | POA: Diagnosis not present

## 2020-09-06 DIAGNOSIS — D472 Monoclonal gammopathy: Secondary | ICD-10-CM | POA: Diagnosis present

## 2020-09-06 DIAGNOSIS — J189 Pneumonia, unspecified organism: Secondary | ICD-10-CM | POA: Diagnosis present

## 2020-09-06 DIAGNOSIS — R069 Unspecified abnormalities of breathing: Secondary | ICD-10-CM | POA: Diagnosis not present

## 2020-09-06 DIAGNOSIS — N189 Chronic kidney disease, unspecified: Secondary | ICD-10-CM | POA: Diagnosis not present

## 2020-09-06 DIAGNOSIS — I429 Cardiomyopathy, unspecified: Secondary | ICD-10-CM | POA: Diagnosis not present

## 2020-09-06 DIAGNOSIS — K219 Gastro-esophageal reflux disease without esophagitis: Secondary | ICD-10-CM | POA: Diagnosis present

## 2020-09-06 DIAGNOSIS — J918 Pleural effusion in other conditions classified elsewhere: Secondary | ICD-10-CM | POA: Diagnosis present

## 2020-09-06 DIAGNOSIS — Z888 Allergy status to other drugs, medicaments and biological substances status: Secondary | ICD-10-CM | POA: Diagnosis not present

## 2020-09-06 DIAGNOSIS — N170 Acute kidney failure with tubular necrosis: Secondary | ICD-10-CM | POA: Diagnosis present

## 2020-09-06 DIAGNOSIS — N400 Enlarged prostate without lower urinary tract symptoms: Secondary | ICD-10-CM | POA: Diagnosis present

## 2020-09-06 DIAGNOSIS — N179 Acute kidney failure, unspecified: Secondary | ICD-10-CM | POA: Diagnosis not present

## 2020-09-06 DIAGNOSIS — I10 Essential (primary) hypertension: Secondary | ICD-10-CM | POA: Diagnosis not present

## 2020-09-06 DIAGNOSIS — Z951 Presence of aortocoronary bypass graft: Secondary | ICD-10-CM | POA: Diagnosis not present

## 2020-09-06 DIAGNOSIS — M4854XA Collapsed vertebra, not elsewhere classified, thoracic region, initial encounter for fracture: Secondary | ICD-10-CM | POA: Diagnosis present

## 2020-09-06 DIAGNOSIS — I110 Hypertensive heart disease with heart failure: Secondary | ICD-10-CM | POA: Diagnosis not present

## 2020-09-06 DIAGNOSIS — R778 Other specified abnormalities of plasma proteins: Secondary | ICD-10-CM | POA: Diagnosis not present

## 2020-09-06 DIAGNOSIS — E785 Hyperlipidemia, unspecified: Secondary | ICD-10-CM | POA: Diagnosis present

## 2020-09-06 DIAGNOSIS — N184 Chronic kidney disease, stage 4 (severe): Secondary | ICD-10-CM | POA: Diagnosis present

## 2020-09-06 DIAGNOSIS — Z20822 Contact with and (suspected) exposure to covid-19: Secondary | ICD-10-CM | POA: Diagnosis present

## 2020-09-06 DIAGNOSIS — J95811 Postprocedural pneumothorax: Secondary | ICD-10-CM

## 2020-09-06 DIAGNOSIS — J9601 Acute respiratory failure with hypoxia: Secondary | ICD-10-CM | POA: Diagnosis present

## 2020-09-06 DIAGNOSIS — I509 Heart failure, unspecified: Secondary | ICD-10-CM

## 2020-09-06 DIAGNOSIS — N2889 Other specified disorders of kidney and ureter: Secondary | ICD-10-CM | POA: Diagnosis present

## 2020-09-06 DIAGNOSIS — Z885 Allergy status to narcotic agent status: Secondary | ICD-10-CM | POA: Diagnosis not present

## 2020-09-06 DIAGNOSIS — S22070A Wedge compression fracture of T9-T10 vertebra, initial encounter for closed fracture: Secondary | ICD-10-CM | POA: Diagnosis not present

## 2020-09-06 DIAGNOSIS — I5023 Acute on chronic systolic (congestive) heart failure: Secondary | ICD-10-CM | POA: Diagnosis present

## 2020-09-06 DIAGNOSIS — J90 Pleural effusion, not elsewhere classified: Secondary | ICD-10-CM | POA: Diagnosis present

## 2020-09-06 DIAGNOSIS — R600 Localized edema: Secondary | ICD-10-CM | POA: Diagnosis not present

## 2020-09-06 DIAGNOSIS — I248 Other forms of acute ischemic heart disease: Secondary | ICD-10-CM | POA: Diagnosis present

## 2020-09-06 DIAGNOSIS — I130 Hypertensive heart and chronic kidney disease with heart failure and stage 1 through stage 4 chronic kidney disease, or unspecified chronic kidney disease: Secondary | ICD-10-CM | POA: Diagnosis present

## 2020-09-06 DIAGNOSIS — I319 Disease of pericardium, unspecified: Secondary | ICD-10-CM | POA: Diagnosis not present

## 2020-09-06 DIAGNOSIS — N1832 Chronic kidney disease, stage 3b: Secondary | ICD-10-CM | POA: Diagnosis not present

## 2020-09-06 DIAGNOSIS — I2510 Atherosclerotic heart disease of native coronary artery without angina pectoris: Secondary | ICD-10-CM | POA: Diagnosis present

## 2020-09-06 DIAGNOSIS — Z87891 Personal history of nicotine dependence: Secondary | ICD-10-CM

## 2020-09-06 DIAGNOSIS — D649 Anemia, unspecified: Secondary | ICD-10-CM | POA: Diagnosis present

## 2020-09-06 DIAGNOSIS — M7989 Other specified soft tissue disorders: Secondary | ICD-10-CM | POA: Diagnosis present

## 2020-09-06 DIAGNOSIS — I700 Atherosclerosis of aorta: Secondary | ICD-10-CM | POA: Diagnosis not present

## 2020-09-06 DIAGNOSIS — S22000A Wedge compression fracture of unspecified thoracic vertebra, initial encounter for closed fracture: Secondary | ICD-10-CM | POA: Diagnosis present

## 2020-09-06 DIAGNOSIS — I719 Aortic aneurysm of unspecified site, without rupture: Secondary | ICD-10-CM | POA: Diagnosis present

## 2020-09-06 DIAGNOSIS — Z952 Presence of prosthetic heart valve: Secondary | ICD-10-CM

## 2020-09-06 DIAGNOSIS — M1990 Unspecified osteoarthritis, unspecified site: Secondary | ICD-10-CM | POA: Diagnosis present

## 2020-09-06 DIAGNOSIS — R7989 Other specified abnormal findings of blood chemistry: Secondary | ICD-10-CM | POA: Diagnosis present

## 2020-09-06 DIAGNOSIS — Z7689 Persons encountering health services in other specified circumstances: Secondary | ICD-10-CM | POA: Diagnosis not present

## 2020-09-06 DIAGNOSIS — I4891 Unspecified atrial fibrillation: Secondary | ICD-10-CM | POA: Diagnosis not present

## 2020-09-06 DIAGNOSIS — R059 Cough, unspecified: Secondary | ICD-10-CM | POA: Diagnosis not present

## 2020-09-06 DIAGNOSIS — R0902 Hypoxemia: Secondary | ICD-10-CM | POA: Diagnosis not present

## 2020-09-06 DIAGNOSIS — F419 Anxiety disorder, unspecified: Secondary | ICD-10-CM | POA: Diagnosis present

## 2020-09-06 DIAGNOSIS — Z9889 Other specified postprocedural states: Secondary | ICD-10-CM

## 2020-09-06 LAB — CBC WITH DIFFERENTIAL/PLATELET
Abs Immature Granulocytes: 0.04 10*3/uL (ref 0.00–0.07)
Basophils Absolute: 0 10*3/uL (ref 0.0–0.1)
Basophils Relative: 0 %
Eosinophils Absolute: 0.4 10*3/uL (ref 0.0–0.5)
Eosinophils Relative: 5 %
HCT: 37.9 % — ABNORMAL LOW (ref 39.0–52.0)
Hemoglobin: 13.3 g/dL (ref 13.0–17.0)
Immature Granulocytes: 1 %
Lymphocytes Relative: 22 %
Lymphs Abs: 1.7 10*3/uL (ref 0.7–4.0)
MCH: 31.7 pg (ref 26.0–34.0)
MCHC: 35.1 g/dL (ref 30.0–36.0)
MCV: 90.5 fL (ref 80.0–100.0)
Monocytes Absolute: 1 10*3/uL (ref 0.1–1.0)
Monocytes Relative: 13 %
Neutro Abs: 4.5 10*3/uL (ref 1.7–7.7)
Neutrophils Relative %: 59 %
Platelets: 225 10*3/uL (ref 150–400)
RBC: 4.19 MIL/uL — ABNORMAL LOW (ref 4.22–5.81)
RDW: 16.2 % — ABNORMAL HIGH (ref 11.5–15.5)
WBC: 7.7 10*3/uL (ref 4.0–10.5)
nRBC: 0 % (ref 0.0–0.2)

## 2020-09-06 NOTE — ED Triage Notes (Signed)
Pt to ED via EMS from home. Pt states sob x3-4 days. Pt recently dx with pneumonia and has been taking abx for 5 days, Pt sats mid 70s while ambulating. Pt arrives on NRB. Room air sats 97% at this time, pt w/ tachypnea

## 2020-09-07 ENCOUNTER — Ambulatory Visit: Payer: PPO | Admitting: Internal Medicine

## 2020-09-07 ENCOUNTER — Emergency Department: Payer: PPO

## 2020-09-07 ENCOUNTER — Encounter: Payer: Self-pay | Admitting: Family Medicine

## 2020-09-07 DIAGNOSIS — R778 Other specified abnormalities of plasma proteins: Secondary | ICD-10-CM | POA: Diagnosis present

## 2020-09-07 DIAGNOSIS — F419 Anxiety disorder, unspecified: Secondary | ICD-10-CM | POA: Diagnosis present

## 2020-09-07 DIAGNOSIS — D472 Monoclonal gammopathy: Secondary | ICD-10-CM | POA: Diagnosis present

## 2020-09-07 DIAGNOSIS — I5023 Acute on chronic systolic (congestive) heart failure: Secondary | ICD-10-CM | POA: Diagnosis present

## 2020-09-07 DIAGNOSIS — N184 Chronic kidney disease, stage 4 (severe): Secondary | ICD-10-CM

## 2020-09-07 DIAGNOSIS — M4854XA Collapsed vertebra, not elsewhere classified, thoracic region, initial encounter for fracture: Secondary | ICD-10-CM | POA: Diagnosis present

## 2020-09-07 DIAGNOSIS — I10 Essential (primary) hypertension: Secondary | ICD-10-CM

## 2020-09-07 DIAGNOSIS — D649 Anemia, unspecified: Secondary | ICD-10-CM | POA: Diagnosis present

## 2020-09-07 DIAGNOSIS — J189 Pneumonia, unspecified organism: Secondary | ICD-10-CM | POA: Diagnosis present

## 2020-09-07 DIAGNOSIS — I2510 Atherosclerotic heart disease of native coronary artery without angina pectoris: Secondary | ICD-10-CM | POA: Diagnosis present

## 2020-09-07 DIAGNOSIS — Z888 Allergy status to other drugs, medicaments and biological substances status: Secondary | ICD-10-CM | POA: Diagnosis not present

## 2020-09-07 DIAGNOSIS — J9811 Atelectasis: Secondary | ICD-10-CM | POA: Diagnosis present

## 2020-09-07 DIAGNOSIS — E785 Hyperlipidemia, unspecified: Secondary | ICD-10-CM | POA: Diagnosis present

## 2020-09-07 DIAGNOSIS — J918 Pleural effusion in other conditions classified elsewhere: Secondary | ICD-10-CM | POA: Diagnosis present

## 2020-09-07 DIAGNOSIS — I719 Aortic aneurysm of unspecified site, without rupture: Secondary | ICD-10-CM | POA: Diagnosis present

## 2020-09-07 DIAGNOSIS — N170 Acute kidney failure with tubular necrosis: Secondary | ICD-10-CM | POA: Diagnosis present

## 2020-09-07 DIAGNOSIS — Z7982 Long term (current) use of aspirin: Secondary | ICD-10-CM | POA: Diagnosis not present

## 2020-09-07 DIAGNOSIS — N179 Acute kidney failure, unspecified: Secondary | ICD-10-CM

## 2020-09-07 DIAGNOSIS — I248 Other forms of acute ischemic heart disease: Secondary | ICD-10-CM | POA: Diagnosis present

## 2020-09-07 DIAGNOSIS — N400 Enlarged prostate without lower urinary tract symptoms: Secondary | ICD-10-CM | POA: Diagnosis present

## 2020-09-07 DIAGNOSIS — Z951 Presence of aortocoronary bypass graft: Secondary | ICD-10-CM | POA: Diagnosis not present

## 2020-09-07 DIAGNOSIS — M7989 Other specified soft tissue disorders: Secondary | ICD-10-CM | POA: Diagnosis present

## 2020-09-07 DIAGNOSIS — Z885 Allergy status to narcotic agent status: Secondary | ICD-10-CM | POA: Diagnosis not present

## 2020-09-07 DIAGNOSIS — S22070A Wedge compression fracture of T9-T10 vertebra, initial encounter for closed fracture: Secondary | ICD-10-CM

## 2020-09-07 DIAGNOSIS — J9601 Acute respiratory failure with hypoxia: Secondary | ICD-10-CM | POA: Diagnosis present

## 2020-09-07 DIAGNOSIS — J90 Pleural effusion, not elsewhere classified: Secondary | ICD-10-CM

## 2020-09-07 DIAGNOSIS — I130 Hypertensive heart and chronic kidney disease with heart failure and stage 1 through stage 4 chronic kidney disease, or unspecified chronic kidney disease: Secondary | ICD-10-CM | POA: Diagnosis present

## 2020-09-07 DIAGNOSIS — K219 Gastro-esophageal reflux disease without esophagitis: Secondary | ICD-10-CM | POA: Diagnosis present

## 2020-09-07 DIAGNOSIS — S22000A Wedge compression fracture of unspecified thoracic vertebra, initial encounter for closed fracture: Secondary | ICD-10-CM | POA: Diagnosis present

## 2020-09-07 DIAGNOSIS — Z20822 Contact with and (suspected) exposure to covid-19: Secondary | ICD-10-CM | POA: Diagnosis present

## 2020-09-07 LAB — COMPREHENSIVE METABOLIC PANEL
ALT: 32 U/L (ref 0–44)
AST: 35 U/L (ref 15–41)
Albumin: 3.7 g/dL (ref 3.5–5.0)
Alkaline Phosphatase: 138 U/L — ABNORMAL HIGH (ref 38–126)
Anion gap: 15 (ref 5–15)
BUN: 51 mg/dL — ABNORMAL HIGH (ref 8–23)
CO2: 22 mmol/L (ref 22–32)
Calcium: 8.9 mg/dL (ref 8.9–10.3)
Chloride: 105 mmol/L (ref 98–111)
Creatinine, Ser: 2.19 mg/dL — ABNORMAL HIGH (ref 0.61–1.24)
GFR, Estimated: 28 mL/min — ABNORMAL LOW (ref >60)
Glucose, Bld: 121 mg/dL — ABNORMAL HIGH (ref 70–99)
Potassium: 3.5 mmol/L (ref 3.5–5.1)
Sodium: 142 mmol/L (ref 135–145)
Total Bilirubin: 1.1 mg/dL (ref 0.3–1.2)
Total Protein: 7.2 g/dL (ref 6.5–8.1)

## 2020-09-07 LAB — RESP PANEL BY RT-PCR (FLU A&B, COVID) ARPGX2
Influenza A by PCR: NEGATIVE
Influenza B by PCR: NEGATIVE
SARS Coronavirus 2 by RT PCR: NEGATIVE

## 2020-09-07 LAB — MAGNESIUM: Magnesium: 2.4 mg/dL (ref 1.7–2.4)

## 2020-09-07 LAB — BRAIN NATRIURETIC PEPTIDE: B Natriuretic Peptide: 3448.5 pg/mL — ABNORMAL HIGH (ref 0.0–100.0)

## 2020-09-07 LAB — TROPONIN I (HIGH SENSITIVITY)
Troponin I (High Sensitivity): 82 ng/L — ABNORMAL HIGH (ref <18)
Troponin I (High Sensitivity): 87 ng/L — ABNORMAL HIGH (ref <18)

## 2020-09-07 LAB — PROCALCITONIN: Procalcitonin: <0.1 ng/mL

## 2020-09-07 MED ORDER — HEPARIN SODIUM (PORCINE) 5000 UNIT/ML IJ SOLN
5000.0000 [IU] | Freq: Three times a day (TID) | INTRAMUSCULAR | Status: DC
  Administered 2020-09-07 – 2020-09-08 (×4): 5000 [IU] via SUBCUTANEOUS
  Filled 2020-09-07 (×4): qty 1

## 2020-09-07 MED ORDER — FUROSEMIDE 10 MG/ML IJ SOLN: 40.0000 mg | Freq: Two times a day (BID) | INTRAMUSCULAR | Status: DC

## 2020-09-07 MED ORDER — TORSEMIDE 20 MG PO TABS
40.0000 mg | ORAL_TABLET | Freq: Every day | ORAL | Status: DC
  Administered 2020-09-08 – 2020-09-09 (×2): 40 mg via ORAL
  Filled 2020-09-07 (×2): qty 2

## 2020-09-07 MED ORDER — SODIUM CHLORIDE 0.9% FLUSH
3.0000 mL | INTRAVENOUS | Status: DC | PRN
Start: 2020-09-07 — End: 2020-09-10

## 2020-09-07 MED ORDER — ACETAMINOPHEN 325 MG PO TABS: 650.0000 mg | ORAL_TABLET | ORAL | Status: DC | PRN

## 2020-09-07 MED ORDER — SODIUM CHLORIDE 0.9 % IV SOLN: 250.0000 mL | INTRAVENOUS | Status: DC | PRN

## 2020-09-07 MED ORDER — ALPRAZOLAM 0.25 MG PO TABS: 0.2500 mg | ORAL_TABLET | Freq: Two times a day (BID) | ORAL | Status: DC | PRN

## 2020-09-07 MED ORDER — LEVOFLOXACIN IN D5W 750 MG/150ML IV SOLN
750.0000 mg | Freq: Once | INTRAVENOUS | Status: AC
  Administered 2020-09-07: 02:00:00 750 mg via INTRAVENOUS
  Filled 2020-09-07: qty 150

## 2020-09-07 MED ORDER — ONDANSETRON HCL 4 MG/2ML IJ SOLN: 4.0000 mg | Freq: Four times a day (QID) | INTRAMUSCULAR | Status: DC | PRN

## 2020-09-07 MED ORDER — AMLODIPINE BESYLATE 5 MG PO TABS
5.0000 mg | ORAL_TABLET | Freq: Every day | ORAL | Status: DC
  Administered 2020-09-07 – 2020-09-08 (×2): 5 mg via ORAL
  Filled 2020-09-07 (×2): qty 1

## 2020-09-07 MED ORDER — FUROSEMIDE 10 MG/ML IJ SOLN
60.0000 mg | Freq: Once | INTRAMUSCULAR | Status: AC
  Administered 2020-09-07: 02:00:00 60 mg via INTRAVENOUS
  Filled 2020-09-07: qty 8

## 2020-09-07 MED ORDER — SODIUM CHLORIDE 0.9% FLUSH
3.0000 mL | Freq: Two times a day (BID) | INTRAVENOUS | Status: DC
  Administered 2020-09-07 – 2020-09-09 (×6): 3 mL via INTRAVENOUS

## 2020-09-07 MED ORDER — PRAVASTATIN SODIUM 20 MG PO TABS
10.0000 mg | ORAL_TABLET | Freq: Every evening | ORAL | Status: DC
  Administered 2020-09-07 – 2020-09-09 (×3): 10 mg via ORAL
  Filled 2020-09-07 (×4): qty 1

## 2020-09-07 MED ORDER — MELATONIN 5 MG PO TABS
2.5000 mg | ORAL_TABLET | Freq: Every evening | ORAL | Status: DC | PRN
  Filled 2020-09-07: qty 0.5

## 2020-09-07 MED ORDER — ASPIRIN EC 81 MG PO TBEC
81.0000 mg | DELAYED_RELEASE_TABLET | Freq: Two times a day (BID) | ORAL | Status: DC
  Administered 2020-09-07 – 2020-09-08 (×3): 81 mg via ORAL
  Filled 2020-09-07 (×3): qty 1

## 2020-09-07 NOTE — ED Provider Notes (Signed)
[facility] Emergency Department Provider Note  Time seen: 12:36 AM  I have reviewed the triage vital signs and the nursing notes.   HISTORY  Chief Complaint Shortness of Breath   HPI [name] is a 86 y.o. male with a past medical history of anemia, CKD, hypertension, hyperlipidemia, presents to the emergency department for shortness of breath.  According to the patient over the past week or so he has been feeling progressively more short of breath.  Saw his PCP 3 to 4 days ago who prescribed him a new medicine, which he believes was for pneumonia.  Patient does not require oxygen at home however upon arrival to the emergency department minimal exertion such as moving from the wheelchair to the bed patient desats to around 80%.  Currently on 3 L satting 97% during my evaluation.  Patient states he has been coughing.  Denies any fever.  States he was tested for Covid several days ago that was negative.   Denies any chest pain.  Does state mild lower extremity edema.  Past Medical History:  Diagnosis Date  . Anemia   . Aortic aneurysm ([place])   . Aortic aneurysm ([place])   . Arthritis   . CKD (chronic kidney disease)    ALSO LEFT RENAL MASS  . Coronary artery disease   . Diverticulosis    WITH RUPTURE  . GERD (gastroesophageal reflux disease)   . History of hiatal hernia   . Hypercholesteremia   . Hypertension   . MGUS (monoclonal gammopathy of unknown significance)   . MGUS (monoclonal gammopathy of unknown significance)     Patient Active Problem List   Diagnosis Date Noted  . Pressure injury of skin 08/07/2020  . CHF (congestive heart failure) ([place]) 08/05/2020  . Periprosthetic fracture around internal prosthetic hip joint 08/05/2020  . Hyponatremia 08/05/2020  . Constipation 05/15/2020  . AKI (acute kidney injury) ([place]) 05/15/2020  . Hip fracture ([place]) 05/11/2020  . MGUS (monoclonal gammopathy of unknown significance) 05/23/2016  . S/P total knee  arthroplasty 04/02/2016  . Chronic kidney disease 11/22/2015  . CAD in native artery 11/22/2015  . CKD (chronic kidney disease) stage 3, GFR 30-59 ml/min (HCC) 06/30/2015  . TI (tricuspid incompetence) 05/11/2015  . Early satiety 01/25/2015  . Benign essential HTN 01/03/2015  . Abdominal aortic aneurysm (AAA) without rupture ([place]) 07/09/2014  . MI (mitral incompetence) 07/09/2014  . Aortic heart valve narrowing 07/09/2014  . Cardiomyopathy, ischemic 06/28/2014  . Arthritis of knee, degenerative 05/17/2014  . Kidney lump 04/09/2013  . Benign prostatic hyperplasia with urinary obstruction 03/17/2013  . Chronic kidney disease, stage IV (severe) ([place]) 03/17/2013  . 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 AORTIC VALVE REPLACEMENT    . bipass    . CARDIAC VALVE REPLACEMENT     pig valve  . CHOLECYSTECTOMY    . COLON SURGERY    . CORONARY ANGIOPLASTY    . CORONARY ARTERY BYPASS GRAFT    . ESOPHAGOGASTRODUODENOSCOPY (EGD) WITH PROPOFOL N/A 02/07/2015   Procedure: ESOPHAGOGASTRODUODENOSCOPY (EGD) WITH PROPOFOL;  Surgeon: Hulen Luster, MD;  Location: [facility] ENDOSCOPY;  Service: Gastroenterology;  Laterality: N/A;  . EYE SURGERY    . HERNIA REPAIR    . INTRAMEDULLARY (IM) NAIL INTERTROCHANTERIC Right 05/12/2020   Procedure: INTRAMEDULLARY (IM) NAIL INTERTROCHANTRIC;  Surgeon: Lovell Sheehan, MD;  Location: [facility] ORS;  Service: Orthopedics;  Laterality: Right;  . JOINT  REPLACEMENT     left knee x4  . KNEE ARTHROPLASTY Right 04/02/2016   Procedure: COMPUTER ASSISTED TOTAL KNEE ARTHROPLASTY;  Surgeon: Dereck Leep, MD;  Location: [facility] ORS;  Service: Orthopedics;  Laterality: Right;  . ORIF FEMUR FRACTURE Right 08/08/2020   Procedure: OPEN REDUCTION INTERNAL FIXATION (ORIF) DISTAL FEMUR FRACTURE;  Surgeon: Lovell Sheehan, MD;  Location: [facility] ORS;  Service: Orthopedics;  Laterality: Right;    Prior to Admission  medications   Medication Sig Start Date End Date Taking? Authorizing Provider  amLODipine (NORVASC) 5 MG tablet Take 5 mg by mouth daily.    [provider]  aspirin EC 81 MG tablet Take 1 tablet (81 mg total) by mouth 2 (two) times daily. Swallow whole. 08/10/20   Donne Hazel, MD  docusate sodium (COLACE) 100 MG capsule Take 1 capsule (100 mg total) by mouth 2 (two) times daily. 05/16/20   Amin, Ankit Chirag, MD  enoxaparin (LOVENOX) 40 MG/0.4ML injection Inject 0.4 mLs (40 mg total) into the skin daily for 14 days. 05/17/20 05/31/20  Amin, Jeanella Flattery, MD  fluticasone (FLONASE) 50 MCG/ACT nasal spray Place 2 sprays into both nostrils daily.    [provider]  furosemide (LASIX) 40 MG tablet Take 1 tablet (40 mg total) by mouth daily. 08/11/20 09/10/20  Donne Hazel, MD  irbesartan (AVAPRO) 150 MG tablet Take 1 tablet by mouth 2 (two) times daily. 06/08/19   [provider]  magnesium citrate SOLN Take 1 Bottle by mouth as needed for severe constipation.    [provider]  Melatonin 3 MG TABS Take 1.5 mg by mouth at bedtime as needed (sleep).    [provider]  polyethylene glycol (MIRALAX / GLYCOLAX) 17 g packet Take 17 g by mouth daily as needed for moderate constipation or severe constipation. 05/16/20   Amin, Jeanella Flattery, MD  pravastatin (PRAVACHOL) 20 MG tablet Take 20 mg by mouth every evening.    [provider]  sodium bicarbonate 650 MG tablet Take 1,300 mg by mouth 2 (two) times daily.    [provider]  traMADol-acetaminophen (ULTRACET) 37.5-325 MG tablet Take 1-2 tablets by mouth every 6 (six) hours as needed for severe pain. 05/16/20   Damita Lack, MD    Allergies  Allergen Reactions  . Morphine Anaphylaxis  . Oxycodone Other (See Comments)    Hypotension and bradycardia  . Carvedilol     Dizziness and syncope   . Iodinated Diagnostic Agents Rash    Other reaction(s): Asthenia (finding), Other  (qualifier value) paralyzed legs Other reaction(s): RASH   . Lisinopril Cough and Other (See Comments)    Family History  Problem Relation Age of Onset  . Bladder Cancer Neg Hx   . Prostate cancer Neg Hx   . Kidney cancer Neg Hx     Social History Social History   Tobacco Use  . Smoking status: Former Research scientist (life sciences)  . Smokeless tobacco: Never Used  Substance Use Topics  . Alcohol use: No  . Drug use: No    Review of Systems Constitutional: Negative for fever. Cardiovascular: Negative for chest pain. Respiratory: Positive for shortness of breath, progressively worse over the past 1 week. Gastrointestinal: Negative for abdominal pain, vomiting  Musculoskeletal: Mild lower extreme edema equal bilaterally.  No calf tenderness. Skin: Negative for skin complaints  Neurological: Negative for headache All other ROS negative  ____________________________________________   PHYSICAL EXAM:  VITAL SIGNS: ED Triage Vitals  Enc Vitals Group  BP 09/06/20 2339 (!) 132/94     Pulse Rate 09/06/20 2339 98     Resp 09/06/20 2339 (!) 40     Temp 09/06/20 2341 97.6 F (36.4 C)     Temp Source 09/06/20 2341 Oral     SpO2 09/06/20 2339 98 %     Weight 09/06/20 2334 180 lb (81.6 kg)     Height 09/06/20 2334 5\' 7"  (1.702 m)     Head Circumference --      Peak Flow --      Pain Score 09/06/20 2334 0     Pain Loc --      Pain Edu? --      Excl. in [place]? --    Constitutional: Alert and oriented. Well appearing and in no distress. Eyes: Normal exam ENT      Head: Normocephalic and atraumatic.      Mouth/Throat: Mucous membranes are moist. Cardiovascular: Normal rate, regular rhythm.  Respiratory: Moderate tachypnea, overall clear lung sounds with no obvious wheeze rales or rhonchi. Gastrointestinal: Soft and nontender. No distention. Musculoskeletal: Nontender with normal range of motion in all extremities.  Mild/1+ lower extreme edema equal bilaterally. Neurologic:  Normal speech and  language. No gross focal neurologic deficits  Skin:  Skin is warm, dry and intact.  Psychiatric: Mood and affect are normal.  ____________________________________________    EKG  EKG viewed and interpreted by myself shows what appears most consistent with sinus tachycardia at 105 bpm with a widened QRS, left axis deviation, frequent PVCs.  No obvious ST elevation.  ____________________________________________    RADIOLOGY  Chest x-ray shows stable bilateral pleural effusions with bibasilar atelectasis versus infiltrates.  ____________________________________________   INITIAL IMPRESSION / ASSESSMENT AND PLAN / ED COURSE  Pertinent labs & imaging results that were available during my care of the patient were reviewed by me and considered in my medical decision making (see chart for details).   Patient presents to the emergency department for worsening shortness of breath over the past 1 week.  Patient does not require oxygen however is moving into the bed the patient desats to 80% remains tachypneic around 30 breaths/min.  Patient is having frequent PVCs (likely why the pulse rate is capturing incorrectly in the 30s, actual pulse rate remains around 90 bpm during my evaluation).  Patient denies any chest pain.  I reviewed the patient's notes from Dr. Wynetta Emery patient was started on Augmentin antibiotic 09/02/2020.  Given the patient's desaturation with minimal exertion anticipate likely admission to the hospital service.  Thus far the patient's work-up has not shown any significant findings.  Troponin is elevated at 87 however largely unchanged from prior results.  Chest x-ray does show moderate atelectasis versus infiltrate.  We will proceed with CT scan without contrast given kidney function to further evaluate.  Patient does have chronic kidney disease largely unchanged from past values.  We will repeat a troponin, Covid swab, noncontrasted CT scan of the chest.  Patient will likely require  admission.  Satting in the mid to upper 90s on 3 L currently.  CT scan shows atelectasis versus pneumonia as well as moderate pleural effusion.  BNP is elevated with lower extremity edema we will dose IV Lasix.  Given the CT findings we will cover with IV antibiotics.  Given hypoxia with minimal exertion we will admit to the hospital service for further work-up and treatment.  Patient agreeable plan of care.  [name] was evaluated in Emergency Department on 09/07/2020 for  the symptoms described in the history of present illness. He was evaluated in the context of the global COVID-19 pandemic, which necessitated consideration that the patient might be at risk for infection with the SARS-CoV-2 virus that causes COVID-19. Institutional protocols and algorithms that pertain to the evaluation of patients at risk for COVID-19 are in a state of rapid change based on information released by regulatory bodies including the CDC and federal and state organizations. These policies and algorithms were followed during the patient's care in the ED.  ____________________________________________   FINAL CLINICAL IMPRESSION(S) / ED DIAGNOSES  Shortness of breath Community-acquired pneumonia CHF   Harvest Dark, MD 09/07/20 [phone]

## 2020-09-07 NOTE — ED Notes (Signed)
Pt ate approx 50% of meal from breakfast and 6 oz PO fluid

## 2020-09-07 NOTE — Progress Notes (Signed)
[place], [place] 09/07/20  Subjective:   Hospital day # 0 Patient presented to the emergency room for progressively worsening shortness of breath.  In the ER, he was hypoxic with minimal exertion therefore was placed on oxygen supplementation.  COVID PCR is negative. CT chest shows moderate to severe bilateral lower lobe consolidation secondary to infiltrate versus atelectasis.  Moderate-sized right pleural effusion.  Mild cardiomegaly.  Severe coronary calcifications. Patient also has lower extremity edema  Baseline creatinine of 1.68 from November 24 Presenting creatinine of 2.2 Nephrology consult requested for evaluation of AKI and volume overload  Renal: 12/21 0701 - 12/22 0700 In: 150.1 [IV Piggyback:150.1] Out: 900 [Urine:900] Lab Results  Component Value Date   CREATININE 2.19 (H) 09/06/2020   CREATININE 1.68 (H) 08/10/2020   CREATININE 1.90 (H) 08/09/2020     Objective:  Vital signs in last 24 hours:  Temp:  [97.5 F (36.4 C)-97.6 F (36.4 C)] 97.5 F (36.4 C) (12/22 0729) Pulse Rate:  [34-98] 92 (12/22 0900) Resp:  [18-40] 28 (12/22 1100) BP: (96-132)/(77-94) 111/83 (12/22 1100) SpO2:  [95 %-100 %] 95 % (12/22 1100) Weight:  [81.6 kg] 81.6 kg (12/21 2334)  Weight change:  Filed Weights   09/06/20 2334  Weight: 81.6 kg    Intake/Output:    Intake/Output Summary (Last 24 hours) at 09/07/2020 1136 Last data filed at 09/07/2020 2671 Gross per 24 hour  Intake 150.12 ml  Output 1800 ml  Net -1649.88 ml    Physical Exam: General:  No acute distress, laying in the bed  HEENT  anicteric, moist oral mucous membrane  Pulm/lungs  normal breathing effort, left lower lobe crackles, bilateral rhonchi, [place] O2  CVS/Heart  regular rhythm, no rub or gallop  Abdomen:   Soft, nontender  Extremities:  ++ Dependent peripheral edema  Neurologic:  Alert, oriented, able to follow commands  Skin:  No acute rashes      Basic Metabolic  Panel:  Recent Labs  Lab 09/06/20 2342 09/07/20 0452  NA 142  --   K 3.5  --   CL 105  --   CO2 22  --   GLUCOSE 121*  --   BUN 51*  --   CREATININE 2.19*  --   CALCIUM 8.9  --   MG  --  2.4     CBC: Recent Labs  Lab 09/06/20 2342  WBC 7.7  NEUTROABS 4.5  HGB 13.3  HCT 37.9*  MCV 90.5  PLT 225     No results found for: HEPBSAG, HEPBSAB, HEPBIGM    Microbiology:  Recent Results (from the past 240 hour(s))  Resp Panel by RT-PCR (Flu A&B, Covid) Nasopharyngeal Swab     Status: None   Collection Time: 09/06/20 11:42 PM   Specimen: Nasopharyngeal Swab; Nasopharyngeal(NP) swabs in vial transport medium  Result Value Ref Range Status   SARS Coronavirus 2 by RT PCR NEGATIVE NEGATIVE Final    Comment: (NOTE) SARS-CoV-2 target nucleic acids are NOT DETECTED.  The SARS-CoV-2 RNA is generally detectable in upper respiratory specimens during the acute phase of infection. The lowest concentration of SARS-CoV-2 viral copies this assay can detect is 138 copies/mL. A negative result does not preclude SARS-Cov-2 infection and should not be used as the sole basis for treatment or other patient management decisions. A negative result may occur with  improper specimen collection/handling, submission of specimen other than nasopharyngeal swab, presence of viral mutation(s) within the areas targeted by this assay, and inadequate  number of viral copies(<138 copies/mL). A negative result must be combined with clinical observations, patient history, and epidemiological information. The expected result is Negative.  Fact Sheet for Patients:  EntrepreneurPulse.com.au  Fact Sheet for Healthcare Providers:  IncredibleEmployment.be  This test is no t yet approved or cleared by the [place] FDA and  has been authorized for detection and/or diagnosis of SARS-CoV-2 by FDA under an Emergency Use Authorization (EUA). This EUA will remain  in  effect (meaning this test can be used) for the duration of the COVID-19 declaration under Section 564(b)(1) of the Act, 21 U.S.C.section 360bbb-3(b)(1), unless the authorization is terminated  or revoked sooner.       Influenza A by PCR NEGATIVE NEGATIVE Final   Influenza B by PCR NEGATIVE NEGATIVE Final    Comment: (NOTE) The Xpert Xpress SARS-CoV-2/FLU/RSV plus assay is intended as an aid in the diagnosis of influenza from Nasopharyngeal swab specimens and should not be used as a sole basis for treatment. Nasal washings and aspirates are unacceptable for Xpert Xpress SARS-CoV-2/FLU/RSV testing.  Fact Sheet for Patients: EntrepreneurPulse.com.au  Fact Sheet for Healthcare Providers: IncredibleEmployment.be  This test is not yet approved or cleared by the [place] FDA and has been authorized for detection and/or diagnosis of SARS-CoV-2 by FDA under an Emergency Use Authorization (EUA). This EUA will remain in effect (meaning this test can be used) for the duration of the COVID-19 declaration under Section 564(b)(1) of the Act, 21 U.S.C. section 360bbb-3(b)(1), unless the authorization is terminated or revoked.  Performed at [facility], [address] [place], [place] [address]   Blood culture (routine x 2)     Status: None (Preliminary result)   Collection Time: 09/06/20 11:42 PM   Specimen: BLOOD  Result Value Ref Range Status   Specimen Description BLOOD RIGHT ASSIST CONTROL  Final   Special Requests   Final    BOTTLES DRAWN AEROBIC AND ANAEROBIC Blood Culture adequate volume   Culture   Final    NO GROWTH < 12 HOURS Performed at [facility], [address], [place], [place] [address]    Report Status PENDING  Incomplete  Blood culture (routine x 2)     Status: None (Preliminary result)   Collection Time: 09/06/20 11:42 PM   Specimen: BLOOD  Result Value Ref Range Status   Specimen Description BLOOD  LEFT ASSIST CONTROL  Final   Special Requests   Final    BOTTLES DRAWN AEROBIC AND ANAEROBIC Blood Culture results may not be optimal due to an excessive volume of blood received in culture bottles   Culture   Final    NO GROWTH < 12 HOURS Performed at [facility], [address] [place], [name] [address]    Report Status PENDING  Incomplete    Coagulation Studies: No results for input(s): LABPROT, INR in the last 72 hours.  Urinalysis: No results for input(s): COLORURINE, LABSPEC, PHURINE, GLUCOSEU, HGBUR, BILIRUBINUR, KETONESUR, PROTEINUR, UROBILINOGEN, NITRITE, LEUKOCYTESUR in the last 72 hours.  Invalid input(s): APPERANCEUR    Imaging: CT Chest Wo Contrast  Result Date: 09/07/2020 CLINICAL DATA:  Cough and shortness of breath. EXAM: CT CHEST WITHOUT CONTRAST TECHNIQUE: Multidetector CT imaging of the chest was performed following the standard protocol without IV contrast. COMPARISON:  Jan 27, 2018 FINDINGS: Cardiovascular: There is marked severity calcification of the aortic arch and descending thoracic aorta. Calcification of the ascending aorta is also noted. There is mild cardiomegaly with marked severity coronary artery calcification.  An artificial aortic valve is seen. No pericardial effusion. Mediastinum/Nodes: Multiple sternal wires are present. Multiple subcentimeter calcified and noncalcified pretracheal and AP window lymph nodes are seen. The thyroid gland, trachea and esophagus demonstrate no significant abnormalities. Lungs/Pleura: Moderate to marked severity areas of consolidation seen within the bilateral lower lobes. These areas contain large clusters of tiny hyperdense foci. A 7 mm calcified granuloma is seen within the anterior aspect of the right middle lobe. There is a moderate-sized right pleural effusion. A small left pleural effusion is also seen. No pneumothorax is identified. Upper Abdomen: Right upper quadrant surgical clips are seen. Bilateral renal  cysts are noted. Musculoskeletal: A compression fracture deformity of indeterminate age is seen at the level of T10. This represents a new finding when compared to the prior study. Multiple chronic left-sided rib fractures are also seen. Degenerative changes are noted throughout the thoracic spine. IMPRESSION: 1. Moderate to marked severity areas of bilateral lower lobe consolidation likely secondary to areas of atelectasis and/or infiltrate. Follow-up to resolution is recommended to exclude the presence of an underlying neoplastic process. 2. Moderate-sized right and small left pleural effusions. 3. Mild cardiomegaly with marked severity coronary artery calcification. 4. Evidence of prior median sternotomy and aortic valve replacement. 5. Bilateral renal cysts. 6. Compression fracture deformity of indeterminate age at the level of T10. This represents a new finding when compared to the prior study. MRI correlation is recommended. 7. Multiple chronic left-sided rib fractures. 8. Aortic atherosclerosis. Aortic Atherosclerosis (ICD10-I70.0). Electronically Signed   By: Virgina Norfolk M.D.   On: 09/07/2020 01:47   DG Chest Portable 1 View  Result Date: 09/07/2020 CLINICAL DATA:  Shortness of breath. EXAM: PORTABLE CHEST 1 VIEW COMPARISON:  August 07, 2020 FINDINGS: Multiple sternal wires are seen. Diffusely prominent interstitial lung markings are noted with mild to moderate severity areas of atelectasis and/or infiltrate within the bilateral lung bases. A stable moderate size right pleural effusion is seen with a small, stable left pleural effusion. No pneumothorax is identified. There is mild to moderate severity enlargement of the cardiac silhouette. An artificial aortic valve is also noted. There is moderate severity calcification of the aortic arch. Degenerative changes seen throughout the thoracic spine. IMPRESSION: 1. Evidence of prior median sternotomy/CABG. 2. Stable bilateral pleural effusions with  mild to moderate severity bibasilar atelectasis and/or infiltrate. Electronically Signed   By: Virgina Norfolk M.D.   On: 09/07/2020 00:22     Medications:   . sodium chloride     . amLODipine  5 mg Oral Daily  . aspirin EC  81 mg Oral BID  . furosemide  40 mg Intravenous BID  . heparin  5,000 Units Subcutaneous Q8H  . pravastatin  10 mg Oral QPM  . sodium chloride flush  3 mL Intravenous Q12H   sodium chloride, acetaminophen, ALPRAZolam, melatonin, ondansetron (ZOFRAN) IV, sodium chloride flush  Assessment/ Plan:  86 y.o. male with hypertension, hyperlipidemia, aortic aneurysm, CKD, coronary artery disease, MGUS, history of sternotomy and aortic valve replacement admitted on 09/06/2020 for Acute respiratory failure with hypoxia (HCC) [J96.01]  #Acute kidney injury on chronic kidney disease stage IIIb Baseline creatinine of 1.68/GFR 39 from August 10, 2020 AKI likely secondary to ATN from concurrent illness and possible pneumonia Continue to monitor renal parameters daily  #Volume overload with lower extremity edema    Pleural effusions may be related to pneumonia At home, patient was taking furosemide 40 mg 1-2 times per day Currently getting furosemide 40 mg IV twice  a day Consider changing to oral furosemide starting Thursday  #Pneumonia and large pleural effusion Covid negative Antibiotics as per internal medicine team Patient would benefit from thoracentesis for symptomatic relief    LOS: 0 Belinda Bringhurst 12/22/202111:36 AM  [place], [place]  Note: This note was prepared with Dragon dictation. Any transcription errors are unintentional

## 2020-09-07 NOTE — Progress Notes (Signed)
No charge progress note.  CHRISTIANO BLANDON is a 86 y.o. male with medical history significant for chronic systolic CHF, chronic kidney disease stage IV, coronary artery disease, hypertension, and anxiety, presenting to emergency department with worsening shortness of breath. The patient reports that he developed progressive dyspnea beginning about a week ago, was started on Augmentin for suspected pneumonia a few days ago, but has continued to worsen despite that. He reports a productive cough but denies any fevers or chest pain. He has noted some slight increase in his chronic bilateral lower extremity swelling. He has been sleeping on 2 pillows recently. No calf tenderness or hemoptysis. Was hypoxic requiring 4 to 5 L of oxygen.  COVID-19 negative. CT chest with bilateral atelectasis/infiltrate and pleural effusions, right more than left.  Concern of underlying malignancy. Worsening renal function.  Patient was very concerned about his kidneys stating that he should not be given any Lasix.  He was requesting his nephrologist and cardiologist to follow. Consult was placed for nephrology and [facility] cardiology.  Procalcitonin ordered and it was negative.  Thoracentesis ordered for symptom relieved and labs.  IV Lasix will be switched to p.o. torsemide from tomorrow.

## 2020-09-07 NOTE — Consult Note (Signed)
   Heart Failure Nurse Navigator Note  [id number]% by echocardiogram 04/2020.  He presented to the emergency room with complaints of worsening shortness of breath.  He states that he recently had x-rays performed by his PCP and was started on antibiotics due to the concern over pneumonia.  But prior to admission he had noted more shortness of breath PND and orthopnea and with checking his O2 saturations was running anywhere from the low 80s to 90s.  Chest x-ray revealed bilateral pulmonary effusions. Chest CT revealed moderate right pleural effusion.  Small left pleural effusion.  Consolidation of the bilateral lower lobes questioning atelectasis, infection or malignancy.   Comorbidities: Chronic kidney disease stage IV Coronary artery disease Hypertension Anxiety   Medication:  Norvasc 5 mg daily Aspirin 81 mg daily Furosemide 40 mg IV twice daily Pravachol 10 mg every evening   Labs:  Sodium 142, potassium 3.5, chloride 105, CO2 22, BUN 51, creatinine 2.19, hemoglobin 13.3, hematocrit 37, BNP 3450, troponin 18, 87 and 52. Weight 81.6 kg BMI 28.19 Blood pressure 111/83.     Assessment:   General he is awake and alert lying on a gurney in the ED, denies any increasing shortness of breath at this time.  HEENT-normocephalic, no JVD, nonicteric.  Cardiac-heart tones of regular rate and rhythm  Chest-breath sounds are diminished in the bases.  Abdomen rounded soft nontender   Musculoskeletal-lower extremity pitting edema.  Neuro-speech is clear moves all extremities.  Psych-is pleasant and appropriate, makes good eye contact.   Met with patient in the emergency room, he remembered me from previous meetings.  He states at home he continues to weigh himself daily, will notice fluctuations of 2 to 3 pounds in a days time and then weight will go back down, some of that he attributed to having milkshakes or eating a piece of pie.  Explained to him that I felt those  increase was probably more fluid weight than his dietary intake.   He states that he uses does not take in any more than 64 ounces in a days time and he also states that his nephrologist had told him not to take any more than 40 ounces and in a days time.  When questioned why he did not keep his appointments with the outpatient heart failure clinic he states is that he had so many appointments with other physicians that he just felt he had left some appointments ago.  Discussed with him that Otila Kluver in the heart failure clinic is a good resource if he has questions where he feels that he is getting into problems that he could give her a call.  Also asked if he felt that he could not make his appointments to please give the clinic a call to cancel and reschedule.   Pricilla Riffle RN, CHFN

## 2020-09-07 NOTE — H&P (Signed)
History and Physical    LANCELOT ALYEA ZOX:[id number] DOB: June 07, 1931 DOA: 09/06/2020  PCP: Baxter Hire, MD   Patient coming from: Home   Chief Complaint: SOB   HPI: KELVON GIANNINI is a 86 y.o. male with medical history significant for chronic systolic CHF, chronic kidney disease stage IV, coronary artery disease, hypertension, and anxiety, presenting to emergency department with worsening shortness of breath. The patient reports that he developed progressive dyspnea beginning about a week ago, was started on Augmentin for suspected pneumonia a few days ago, but has continued to worsen despite that. He reports a productive cough but denies any fevers or chest pain. He has noted some slight increase in his chronic bilateral lower extremity swelling. He has been sleeping on 2 pillows recently. No calf tenderness or hemoptysis.  ED Course: Upon arrival to the ED, patient is found to be afebrile, saturating 80% on room air, tachypneic, and with stable blood pressure. EKG features and undetermined rhythm with PVCs and IVCD. Chest x-ray notable for bilateral pleural effusions and atelectasis versus infiltrate. CT chest demonstrates moderate right and small left pleural effusions with consolidation in the bilateral lower lobes which could reflect atelectasis, infection, or possibly underlying malignancy. Also noted on CT is a T10 compression deformity. Chemistry panel notable for creatinine 2.19, up from 1.68 last month. CBC is unremarkable. High-sensitivity troponin is 87 then 82. BNP is elevated to 3000.49. COVID-19 PCR is negative. Blood cultures were collected in the ED and the patient was treated with 60 mg IV Lasix, 750 mg IV Levaquin.  Review of Systems:  All other systems reviewed and apart from HPI, are negative.  Past Medical History:  Diagnosis Date  . Anemia   . Aortic aneurysm ([place])   . Aortic aneurysm ([place])   . Arthritis   . CKD (chronic kidney disease)    ALSO LEFT RENAL MASS   . Coronary artery disease   . Diverticulosis    WITH RUPTURE  . GERD (gastroesophageal reflux disease)   . History of hiatal hernia   . Hypercholesteremia   . Hypertension   . MGUS (monoclonal gammopathy of unknown significance)   . MGUS (monoclonal gammopathy of unknown significance)     Past Surgical History:  Procedure Laterality Date  . AORTIC VALVE REPLACEMENT    . bipass    . CARDIAC VALVE REPLACEMENT     pig valve  . CHOLECYSTECTOMY    . COLON SURGERY    . CORONARY ANGIOPLASTY    . CORONARY ARTERY BYPASS GRAFT    . ESOPHAGOGASTRODUODENOSCOPY (EGD) WITH PROPOFOL N/A 02/07/2015   Procedure: ESOPHAGOGASTRODUODENOSCOPY (EGD) WITH PROPOFOL;  Surgeon: Hulen Luster, MD;  Location: [facility] ENDOSCOPY;  Service: Gastroenterology;  Laterality: N/A;  . EYE SURGERY    . HERNIA REPAIR    . INTRAMEDULLARY (IM) NAIL INTERTROCHANTERIC Right 05/12/2020   Procedure: INTRAMEDULLARY (IM) NAIL INTERTROCHANTRIC;  Surgeon: Lovell Sheehan, MD;  Location: [facility] ORS;  Service: Orthopedics;  Laterality: Right;  . JOINT REPLACEMENT     left knee x4  . KNEE ARTHROPLASTY Right 04/02/2016   Procedure: COMPUTER ASSISTED TOTAL KNEE ARTHROPLASTY;  Surgeon: Dereck Leep, MD;  Location: [facility] ORS;  Service: Orthopedics;  Laterality: Right;  . ORIF FEMUR FRACTURE Right 08/08/2020   Procedure: OPEN REDUCTION INTERNAL FIXATION (ORIF) DISTAL FEMUR FRACTURE;  Surgeon: Lovell Sheehan, MD;  Location: [facility] ORS;  Service: Orthopedics;  Laterality: Right;    Social History:   reports that he has quit smoking. He  has never used smokeless tobacco. He reports that he does not drink alcohol and does not use drugs.  Allergies  Allergen Reactions  . Morphine Anaphylaxis  . Oxycodone Other (See Comments)    Hypotension and bradycardia  . Carvedilol     Dizziness and syncope   . Iodinated Diagnostic Agents Rash    Other reaction(s): Asthenia (finding), Other (qualifier value) paralyzed legs Other reaction(s): RASH   .  Lisinopril Cough and Other (See Comments)    Family History  Problem Relation Age of Onset  . Bladder Cancer Neg Hx   . Prostate cancer Neg Hx   . Kidney cancer Neg Hx      Prior to Admission medications   Medication Sig Start Date End Date Taking? Authorizing Provider  ALPRAZolam (XANAX) 0.25 MG tablet Take 0.25 mg by mouth 2 (two) times daily as needed. 08/26/20  Yes [provider]  amLODipine (NORVASC) 5 MG tablet Take 5 mg by mouth daily.   Yes [provider]  amoxicillin-clavulanate (AUGMENTIN) 500-125 MG tablet Take 1 tablet by mouth 2 (two) times daily. 09/02/20  Yes [provider]  aspirin EC 81 MG tablet Take 1 tablet (81 mg total) by mouth 2 (two) times daily. Swallow whole. 08/10/20  Yes Donne Hazel, MD  furosemide (LASIX) 40 MG tablet Take 1 tablet (40 mg total) by mouth daily. 08/11/20 09/10/20 Yes Donne Hazel, MD  irbesartan (AVAPRO) 150 MG tablet Take 1 tablet by mouth 2 (two) times daily. 06/08/19  Yes [provider]  Melatonin 3 MG TABS Take 1.5 mg by mouth at bedtime as needed (sleep).   Yes [provider]  pravastatin (PRAVACHOL) 20 MG tablet Take 20 mg by mouth every evening.   Yes [provider]  sodium bicarbonate 650 MG tablet Take 1,300 mg by mouth 2 (two) times daily.   Yes [provider]  traMADol (ULTRAM) 50 MG tablet Take 50 mg by mouth every 4 (four) hours as needed. 08/16/20  Yes [provider]    Physical Exam: Vitals:   09/07/20 0030 09/07/20 0132 09/07/20 0200 09/07/20 0330  BP: 124/88 96/77 125/83 111/80  Pulse: (!) 34 96 85 93  Resp: 20 18 (!) 27 (!) 26  Temp:      TempSrc:      SpO2: 99% 97% 97% 100%  Weight:      Height:        Constitutional: NAD, calm  Eyes: PERTLA, lids and conjunctivae normal ENMT: Mucous membranes are moist. Posterior pharynx clear of any exudate or lesions.   Neck: normal, supple, no masses, no thyromegaly Respiratory: Diminished  breath sounds bilaterally, wheezing. No pallor or cyanosis.    Cardiovascular: S1 & S2 heard, regular rate and rhythm. Pretibial pitting edema bilaterally.  Abdomen: No distension, no tenderness, soft. Bowel sounds active.  Musculoskeletal: no clubbing / cyanosis. No joint deformity upper and lower extremities.   Skin: no significant rashes, lesions, ulcers. Warm, dry, well-perfused. Neurologic: CN 2-12 grossly intact. Sensation intact. Moving all extremities. Intermittently stuttering speech.  Psychiatric: Alert and oriented to person, place, and situation. Pleasant and cooperative.    Labs and Imaging on Admission: I have personally reviewed following labs and imaging studies  CBC: Recent Labs  Lab 09/06/20 2342  WBC 7.7  NEUTROABS 4.5  HGB 13.3  HCT 37.9*  MCV 90.5  PLT 664   Basic Metabolic Panel: Recent Labs  Lab 09/06/20 2342  NA 142  K 3.5  CL 105  CO2 22  GLUCOSE 121*  BUN 51*  CREATININE 2.19*  CALCIUM 8.9   GFR: Estimated Creatinine Clearance: 24.3 mL/min (A) (by C-G formula based on SCr of 2.19 mg/dL (H)). Liver Function Tests: Recent Labs  Lab 09/06/20 2342  AST 35  ALT 32  ALKPHOS 138*  BILITOT 1.1  PROT 7.2  ALBUMIN 3.7   No results for input(s): LIPASE, AMYLASE in the last 168 hours. No results for input(s): AMMONIA in the last 168 hours. Coagulation Profile: No results for input(s): INR, PROTIME in the last 168 hours. Cardiac Enzymes: No results for input(s): CKTOTAL, CKMB, CKMBINDEX, TROPONINI in the last 168 hours. BNP (last 3 results) No results for input(s): PROBNP in the last 8760 hours. HbA1C: No results for input(s): HGBA1C in the last 72 hours. CBG: No results for input(s): GLUCAP in the last 168 hours. Lipid Profile: No results for input(s): CHOL, HDL, LDLCALC, TRIG, CHOLHDL, LDLDIRECT in the last 72 hours. Thyroid Function Tests: No results for input(s): TSH, T4TOTAL, FREET4, T3FREE, THYROIDAB in the last 72 hours. Anemia  Panel: No results for input(s): VITAMINB12, FOLATE, FERRITIN, TIBC, IRON, RETICCTPCT in the last 72 hours. Urine analysis:    Component Value Date/Time   COLORURINE YELLOW (A) 08/09/2020 1637   APPEARANCEUR CLEAR (A) 08/09/2020 1637   APPEARANCEUR Clear 11/22/2015 0950   LABSPEC 1.012 08/09/2020 1637   PHURINE 6.0 08/09/2020 1637   GLUCOSEU NEGATIVE 08/09/2020 1637   HGBUR SMALL (A) 08/09/2020 1637   BILIRUBINUR NEGATIVE 08/09/2020 1637   BILIRUBINUR Negative 11/22/2015 0950   KETONESUR NEGATIVE 08/09/2020 1637   PROTEINUR 30 (A) 08/09/2020 1637   UROBILINOGEN 1.0 02/16/2010 1336   NITRITE NEGATIVE 08/09/2020 1637   LEUKOCYTESUR NEGATIVE 08/09/2020 1637   Sepsis Labs: @LABRCNTIP (procalcitonin:4,lacticidven:4) ) Recent Results (from the past 240 hour(s))  Resp Panel by RT-PCR (Flu A&B, Covid) Nasopharyngeal Swab     Status: None   Collection Time: 09/06/20 11:42 PM   Specimen: Nasopharyngeal Swab; Nasopharyngeal(NP) swabs in vial transport medium  Result Value Ref Range Status   SARS Coronavirus 2 by RT PCR NEGATIVE NEGATIVE Final    Comment: (NOTE) SARS-CoV-2 target nucleic acids are NOT DETECTED.  The SARS-CoV-2 RNA is generally detectable in upper respiratory specimens during the acute phase of infection. The lowest concentration of SARS-CoV-2 viral copies this assay can detect is 138 copies/mL. A negative result does not preclude SARS-Cov-2 infection and should not be used as the sole basis for treatment or other patient management decisions. A negative result may occur with  improper specimen collection/handling, submission of specimen other than nasopharyngeal swab, presence of viral mutation(s) within the areas targeted by this assay, and inadequate number of viral copies(<138 copies/mL). A negative result must be combined with clinical observations, patient history, and epidemiological information. The expected result is Negative.  Fact Sheet for Patients:   EntrepreneurPulse.com.au  Fact Sheet for Healthcare Providers:  IncredibleEmployment.be  This test is no t yet approved or cleared by the [place] FDA and  has been authorized for detection and/or diagnosis of SARS-CoV-2 by FDA under an Emergency Use Authorization (EUA). This EUA will remain  in effect (meaning this test can be used) for the duration of the COVID-19 declaration under Section 564(b)(1) of the Act, 21 U.S.C.section 360bbb-3(b)(1), unless the authorization is terminated  or revoked sooner.       Influenza A by PCR NEGATIVE NEGATIVE Final   Influenza B by PCR NEGATIVE NEGATIVE Final    Comment: (NOTE) The Xpert Xpress SARS-CoV-2/FLU/RSV plus  assay is intended as an aid in the diagnosis of influenza from Nasopharyngeal swab specimens and should not be used as a sole basis for treatment. Nasal washings and aspirates are unacceptable for Xpert Xpress SARS-CoV-2/FLU/RSV testing.  Fact Sheet for Patients: EntrepreneurPulse.com.au  Fact Sheet for Healthcare Providers: IncredibleEmployment.be  This test is not yet approved or cleared by the [place] FDA and has been authorized for detection and/or diagnosis of SARS-CoV-2 by FDA under an Emergency Use Authorization (EUA). This EUA will remain in effect (meaning this test can be used) for the duration of the COVID-19 declaration under Section 564(b)(1) of the Act, 21 U.S.C. section 360bbb-3(b)(1), unless the authorization is terminated or revoked.  Performed at [facility], [address], [place], [place] [address]      Radiological Exams on Admission: CT Chest Wo Contrast  Result Date: 09/07/2020 CLINICAL DATA:  Cough and shortness of breath. EXAM: CT CHEST WITHOUT CONTRAST TECHNIQUE: Multidetector CT imaging of the chest was performed following the standard protocol without IV contrast. COMPARISON:  Jan 27, 2018 FINDINGS:  Cardiovascular: There is marked severity calcification of the aortic arch and descending thoracic aorta. Calcification of the ascending aorta is also noted. There is mild cardiomegaly with marked severity coronary artery calcification. An artificial aortic valve is seen. No pericardial effusion. Mediastinum/Nodes: Multiple sternal wires are present. Multiple subcentimeter calcified and noncalcified pretracheal and AP window lymph nodes are seen. The thyroid gland, trachea and esophagus demonstrate no significant abnormalities. Lungs/Pleura: Moderate to marked severity areas of consolidation seen within the bilateral lower lobes. These areas contain large clusters of tiny hyperdense foci. A 7 mm calcified granuloma is seen within the anterior aspect of the right middle lobe. There is a moderate-sized right pleural effusion. A small left pleural effusion is also seen. No pneumothorax is identified. Upper Abdomen: Right upper quadrant surgical clips are seen. Bilateral renal cysts are noted. Musculoskeletal: A compression fracture deformity of indeterminate age is seen at the level of T10. This represents a new finding when compared to the prior study. Multiple chronic left-sided rib fractures are also seen. Degenerative changes are noted throughout the thoracic spine. IMPRESSION: 1. Moderate to marked severity areas of bilateral lower lobe consolidation likely secondary to areas of atelectasis and/or infiltrate. Follow-up to resolution is recommended to exclude the presence of an underlying neoplastic process. 2. Moderate-sized right and small left pleural effusions. 3. Mild cardiomegaly with marked severity coronary artery calcification. 4. Evidence of prior median sternotomy and aortic valve replacement. 5. Bilateral renal cysts. 6. Compression fracture deformity of indeterminate age at the level of T10. This represents a new finding when compared to the prior study. MRI correlation is recommended. 7. Multiple  chronic left-sided rib fractures. 8. Aortic atherosclerosis. Aortic Atherosclerosis (ICD10-I70.0). Electronically Signed   By: Virgina Norfolk M.D.   On: 09/07/2020 01:47   DG Chest Portable 1 View  Result Date: 09/07/2020 CLINICAL DATA:  Shortness of breath. EXAM: PORTABLE CHEST 1 VIEW COMPARISON:  August 07, 2020 FINDINGS: Multiple sternal wires are seen. Diffusely prominent interstitial lung markings are noted with mild to moderate severity areas of atelectasis and/or infiltrate within the bilateral lung bases. A stable moderate size right pleural effusion is seen with a small, stable left pleural effusion. No pneumothorax is identified. There is mild to moderate severity enlargement of the cardiac silhouette. An artificial aortic valve is also noted. There is moderate severity calcification of the aortic arch. Degenerative changes seen throughout the thoracic spine. IMPRESSION: 1. Evidence of  prior median sternotomy/CABG. 2. Stable bilateral pleural effusions with mild to moderate severity bibasilar atelectasis and/or infiltrate. Electronically Signed   By: Virgina Norfolk M.D.   On: 09/07/2020 00:22    EKG: Independently reviewed. Undetermined rhythm, PVCs, IVCD.   Assessment/Plan   1. Acute hypoxic respiratory failure; acute on chronic systolic CHF; Pleural effusions  - Presents with several days of progressive SOB despite starting Augmentin for possible pneumonia a few days ago and is found to have BNP 3450, bilateral pleural effusions, no fever or leukocytosis, and oxygen saturation in 80s  - He has recent increase in leg swelling and orthopnea, BNP is higher than priors, and CHF suspected to be the primary problem  - He was given Lasix 60 mg IV in ED and started on supplemental O2  - Continue diuresis with Lasix 40 mg IV q12h, monitor weight and I/Os, continue supplemental O2 as needed, consider thoracentesis if effusions fail to improve with diuresis    2. ?Pneumonia  - There was  question of atelectasis vs PNA on CXR which could not be differentiated on CT chest  - There is no fever or leukocytosis but he has had a productive cough and has been on Augmentin since 12/17  - He was given a dose of Levaquin 740 mg in ED  - Check procalcitonin, encourage incentive spirometry, follow clinical response to diuresis, consider re-dosing Levaquin in 48 hours, follow radiographically to exclude underlying malignancy   3. Elevated troponin  - HS troponin elevated to 87, then 82  - There are no anginal complaints and suspicion for ACS is low  - Treat underlying respiratory issues as above, continue ASA and statin    4. AKI on CKD IV - SCr is 2.19 on admission, up from apparent baseline of ~1.7  - Hold ARB, renally-dose medications, closely monitor renal function and electrolytes while diuresing, hold sodium bicarb for now given normal serum bicarbonate and concern for hypervolemia    5. Hypertension  - BP at goal, continue Norvasc, hold ARB initially as above    6. Anxiety  - Continue low-dose Xanax as needed   7. T10 compression deformity  - Patient not complaining of pain, could be further assessed with MRI     DVT prophylaxis: sq heparin  Code Status: Full  Family Communication: Discussed with patient  Disposition Plan:  Patient is from: Home  Anticipated d/c is to: TBD Anticipated d/c date is: 09/11/20 Patient currently: Pending improvement in respiratory status, stability in renal function   Consults called: None  Admission status: Inpatient     Vianne Bulls, MD [facility]  09/07/2020, 4:30 AM

## 2020-09-07 NOTE — ED Notes (Signed)
Pt given breakfast.

## 2020-09-08 ENCOUNTER — Inpatient Hospital Stay: Payer: PPO

## 2020-09-08 DIAGNOSIS — J9601 Acute respiratory failure with hypoxia: Secondary | ICD-10-CM

## 2020-09-08 LAB — GLUCOSE, PLEURAL OR PERITONEAL FLUID: Glucose, Fluid: 97 mg/dL

## 2020-09-08 LAB — BASIC METABOLIC PANEL
Anion gap: 11 (ref 5–15)
BUN: 42 mg/dL — ABNORMAL HIGH (ref 8–23)
CO2: 23 mmol/L (ref 22–32)
Calcium: 8.9 mg/dL (ref 8.9–10.3)
Chloride: 104 mmol/L (ref 98–111)
Creatinine, Ser: 1.94 mg/dL — ABNORMAL HIGH (ref 0.61–1.24)
GFR, Estimated: 33 mL/min — ABNORMAL LOW (ref >60)
Glucose, Bld: 103 mg/dL — ABNORMAL HIGH (ref 70–99)
Potassium: 3.5 mmol/L (ref 3.5–5.1)
Sodium: 138 mmol/L (ref 135–145)

## 2020-09-08 LAB — BODY FLUID CELL COUNT WITH DIFFERENTIAL
Eos, Fluid: 1 %
Lymphs, Fluid: 87 %
Monocyte-Macrophage-Serous Fluid: 4 %
Neutrophil Count, Fluid: 8 %
Total Nucleated Cell Count, Fluid: 433 cu mm

## 2020-09-08 LAB — LACTATE DEHYDROGENASE, PLEURAL OR PERITONEAL FLUID: LD, Fluid: 118 U/L — ABNORMAL HIGH (ref 3–23)

## 2020-09-08 LAB — LACTATE DEHYDROGENASE: LDH: 169 U/L (ref 98–192)

## 2020-09-08 LAB — PROTEIN, PLEURAL OR PERITONEAL FLUID: Total protein, fluid: <3 g/dL

## 2020-09-08 LAB — ALBUMIN, PLEURAL OR PERITONEAL FLUID: Albumin, Fluid: 1.7 g/dL

## 2020-09-08 LAB — PROCALCITONIN: Procalcitonin: <0.1 ng/mL

## 2020-09-08 MED ORDER — HEPARIN SODIUM (PORCINE) 5000 UNIT/ML IJ SOLN
5000.0000 [IU] | Freq: Three times a day (TID) | INTRAMUSCULAR | Status: DC
  Administered 2020-09-08 – 2020-09-10 (×6): 5000 [IU] via SUBCUTANEOUS
  Filled 2020-09-08 (×6): qty 1

## 2020-09-08 MED ORDER — ASPIRIN EC 81 MG PO TBEC
81.0000 mg | DELAYED_RELEASE_TABLET | Freq: Two times a day (BID) | ORAL | Status: DC
  Administered 2020-09-09 – 2020-09-10 (×3): 81 mg via ORAL
  Filled 2020-09-08 (×3): qty 1

## 2020-09-08 NOTE — Progress Notes (Signed)
[place], [place] 09/08/20  Subjective:   Hospital day # 1 Patient presented to the emergency room for progressively worsening shortness of breath.  In the ER, he was hypoxic with minimal exertion therefore was placed on oxygen supplementation.  COVID PCR is negative. CT chest shows moderate to severe bilateral lower lobe consolidation secondary to infiltrate versus atelectasis.  Moderate-sized right pleural effusion.  Mild cardiomegaly.  Severe coronary calcifications. Patient also has lower extremity edema  Baseline creatinine of 1.68 from November 24 Presenting creatinine of 2.2 Nephrology consult requested for evaluation of AKI and volume overload  Patient underwent thoracentesis today.  1.2 L yellow fluid was removed.  Patient states besides some soreness, he is able to breathe better.  Still requiring oxygen supplementation   Renal: 12/22 0701 - 12/23 0700 In: -  Out: 1100 [Urine:1100] Lab Results  Component Value Date   CREATININE 1.94 (H) 09/08/2020   CREATININE 2.19 (H) 09/06/2020   CREATININE 1.68 (H) 08/10/2020     Objective:  Vital signs in last 24 hours:  Temp:  [97.3 F (36.3 C)-98.3 F (36.8 C)] 97.5 F (36.4 C) (12/23 0758) Pulse Rate:  [52-95] 52 (12/23 1015) Resp:  [17-22] 17 (12/23 0758) BP: (109-125)/(80-97) 109/92 (12/23 1015) SpO2:  [94 %-99 %] 97 % (12/23 1015)  Weight change:  Filed Weights   09/06/20 2334  Weight: 81.6 kg    Intake/Output:    Intake/Output Summary (Last 24 hours) at 09/08/2020 1119 Last data filed at 09/08/2020 1030 Gross per 24 hour  Intake 240 ml  Output --  Net 240 ml    Physical Exam: General:  No acute distress, laying in the bed  HEENT  anicteric, moist oral mucous membrane  Pulm/lungs  normal breathing effort, left lower lobe crackles, bilateral rhonchi, [place] O2  CVS/Heart  regular rhythm, no rub or gallop  Abdomen:   Soft, nontender  Extremities:  + Dependent peripheral edema   Neurologic:  Alert, oriented, able to follow commands  Skin:  No acute rashes      Basic Metabolic Panel:  Recent Labs  Lab 09/06/20 2342 09/07/20 0452 09/08/20 0517  NA 142  --  138  K 3.5  --  3.5  CL 105  --  104  CO2 22  --  23  GLUCOSE 121*  --  103*  BUN 51*  --  42*  CREATININE 2.19*  --  1.94*  CALCIUM 8.9  --  8.9  MG  --  2.4  --      CBC: Recent Labs  Lab 09/06/20 2342  WBC 7.7  NEUTROABS 4.5  HGB 13.3  HCT 37.9*  MCV 90.5  PLT 225     No results found for: HEPBSAG, HEPBSAB, HEPBIGM    Microbiology:  Recent Results (from the past 240 hour(s))  Resp Panel by RT-PCR (Flu A&B, Covid) Nasopharyngeal Swab     Status: None   Collection Time: 09/06/20 11:42 PM   Specimen: Nasopharyngeal Swab; Nasopharyngeal(NP) swabs in vial transport medium  Result Value Ref Range Status   SARS Coronavirus 2 by RT PCR NEGATIVE NEGATIVE Final    Comment: (NOTE) SARS-CoV-2 target nucleic acids are NOT DETECTED.  The SARS-CoV-2 RNA is generally detectable in upper respiratory specimens during the acute phase of infection. The lowest concentration of SARS-CoV-2 viral copies this assay can detect is 138 copies/mL. A negative result does not preclude SARS-Cov-2 infection and should not be used as the sole basis for treatment or other  patient management decisions. A negative result may occur with  improper specimen collection/handling, submission of specimen other than nasopharyngeal swab, presence of viral mutation(s) within the areas targeted by this assay, and inadequate number of viral copies(<138 copies/mL). A negative result must be combined with clinical observations, patient history, and epidemiological information. The expected result is Negative.  Fact Sheet for Patients:  EntrepreneurPulse.com.au  Fact Sheet for Healthcare Providers:  IncredibleEmployment.be  This test is no t yet approved or cleared by the [place] FDA and  has been authorized for detection and/or diagnosis of SARS-CoV-2 by FDA under an Emergency Use Authorization (EUA). This EUA will remain  in effect (meaning this test can be used) for the duration of the COVID-19 declaration under Section 564(b)(1) of the Act, 21 U.S.C.section 360bbb-3(b)(1), unless the authorization is terminated  or revoked sooner.       Influenza A by PCR NEGATIVE NEGATIVE Final   Influenza B by PCR NEGATIVE NEGATIVE Final    Comment: (NOTE) The Xpert Xpress SARS-CoV-2/FLU/RSV plus assay is intended as an aid in the diagnosis of influenza from Nasopharyngeal swab specimens and should not be used as a sole basis for treatment. Nasal washings and aspirates are unacceptable for Xpert Xpress SARS-CoV-2/FLU/RSV testing.  Fact Sheet for Patients: EntrepreneurPulse.com.au  Fact Sheet for Healthcare Providers: IncredibleEmployment.be  This test is not yet approved or cleared by the [place] FDA and has been authorized for detection and/or diagnosis of SARS-CoV-2 by FDA under an Emergency Use Authorization (EUA). This EUA will remain in effect (meaning this test can be used) for the duration of the COVID-19 declaration under Section 564(b)(1) of the Act, 21 U.S.C. section 360bbb-3(b)(1), unless the authorization is terminated or revoked.  Performed at [facility], [address] [place], [place] [address]   Blood culture (routine x 2)     Status: None (Preliminary result)   Collection Time: 09/06/20 11:42 PM   Specimen: BLOOD  Result Value Ref Range Status   Specimen Description BLOOD RIGHT ASSIST CONTROL  Final   Special Requests   Final    BOTTLES DRAWN AEROBIC AND ANAEROBIC Blood Culture adequate volume   Culture   Final    NO GROWTH 1 DAY Performed at [facility], [address], [place], [place] [address]    Report Status PENDING  Incomplete  Blood culture (routine x 2)      Status: None (Preliminary result)   Collection Time: 09/06/20 11:42 PM   Specimen: BLOOD  Result Value Ref Range Status   Specimen Description BLOOD LEFT ASSIST CONTROL  Final   Special Requests   Final    BOTTLES DRAWN AEROBIC AND ANAEROBIC Blood Culture results may not be optimal due to an excessive volume of blood received in culture bottles   Culture   Final    NO GROWTH 1 DAY Performed at [facility], [name] [place], [place] [address]    Report Status PENDING  Incomplete    Coagulation Studies: No results for input(s): LABPROT, INR in the last 72 hours.  Urinalysis: No results for input(s): COLORURINE, LABSPEC, PHURINE, GLUCOSEU, HGBUR, BILIRUBINUR, KETONESUR, PROTEINUR, UROBILINOGEN, NITRITE, LEUKOCYTESUR in the last 72 hours.  Invalid input(s): APPERANCEUR    Imaging: CT Chest Wo Contrast  Result Date: 09/07/2020 CLINICAL DATA:  Cough and shortness of breath. EXAM: CT CHEST WITHOUT CONTRAST TECHNIQUE: Multidetector CT imaging of the chest was performed following the standard protocol without IV contrast. COMPARISON:  Jan 27, 2018 FINDINGS: Cardiovascular:  There is marked severity calcification of the aortic arch and descending thoracic aorta. Calcification of the ascending aorta is also noted. There is mild cardiomegaly with marked severity coronary artery calcification. An artificial aortic valve is seen. No pericardial effusion. Mediastinum/Nodes: Multiple sternal wires are present. Multiple subcentimeter calcified and noncalcified pretracheal and AP window lymph nodes are seen. The thyroid gland, trachea and esophagus demonstrate no significant abnormalities. Lungs/Pleura: Moderate to marked severity areas of consolidation seen within the bilateral lower lobes. These areas contain large clusters of tiny hyperdense foci. A 7 mm calcified granuloma is seen within the anterior aspect of the right middle lobe. There is a moderate-sized right pleural effusion. A  small left pleural effusion is also seen. No pneumothorax is identified. Upper Abdomen: Right upper quadrant surgical clips are seen. Bilateral renal cysts are noted. Musculoskeletal: A compression fracture deformity of indeterminate age is seen at the level of T10. This represents a new finding when compared to the prior study. Multiple chronic left-sided rib fractures are also seen. Degenerative changes are noted throughout the thoracic spine. IMPRESSION: 1. Moderate to marked severity areas of bilateral lower lobe consolidation likely secondary to areas of atelectasis and/or infiltrate. Follow-up to resolution is recommended to exclude the presence of an underlying neoplastic process. 2. Moderate-sized right and small left pleural effusions. 3. Mild cardiomegaly with marked severity coronary artery calcification. 4. Evidence of prior median sternotomy and aortic valve replacement. 5. Bilateral renal cysts. 6. Compression fracture deformity of indeterminate age at the level of T10. This represents a new finding when compared to the prior study. MRI correlation is recommended. 7. Multiple chronic left-sided rib fractures. 8. Aortic atherosclerosis. Aortic Atherosclerosis (ICD10-I70.0). Electronically Signed   By: Virgina Norfolk M.D.   On: 09/07/2020 01:47   DG Chest Port 1 View  Result Date: 09/08/2020 CLINICAL DATA:  Status post thoracentesis EXAM: PORTABLE CHEST 1 VIEW COMPARISON:  CT chest dated 09/07/2020 FINDINGS: Small right pleural effusion, decreased status post thoracentesis. Suspected tiny right apical pneumothorax. Small left pleural effusion. Associated left lower lobe opacity, likely atelectasis. Cardiomegaly. Prosthetic valve. Postsurgical changes related to prior CABG. Median sternotomy. IMPRESSION: Suspected tiny right apical pneumothorax. These results were notified at the time of interpretation on 09/08/2020 at 11:15 am to provider DANIEL HASSELL , who acknowledged these results. Small  right pleural effusion, decreased status post thoracentesis. Small left pleural effusion. Associated left lower lobe opacity, likely atelectasis. Electronically Signed   By: Julian Hy M.D.   On: 09/08/2020 11:16   DG Chest Portable 1 View  Result Date: 09/07/2020 CLINICAL DATA:  Shortness of breath. EXAM: PORTABLE CHEST 1 VIEW COMPARISON:  August 07, 2020 FINDINGS: Multiple sternal wires are seen. Diffusely prominent interstitial lung markings are noted with mild to moderate severity areas of atelectasis and/or infiltrate within the bilateral lung bases. A stable moderate size right pleural effusion is seen with a small, stable left pleural effusion. No pneumothorax is identified. There is mild to moderate severity enlargement of the cardiac silhouette. An artificial aortic valve is also noted. There is moderate severity calcification of the aortic arch. Degenerative changes seen throughout the thoracic spine. IMPRESSION: 1. Evidence of prior median sternotomy/CABG. 2. Stable bilateral pleural effusions with mild to moderate severity bibasilar atelectasis and/or infiltrate. Electronically Signed   By: Virgina Norfolk M.D.   On: 09/07/2020 00:22     Medications:   . sodium chloride     . amLODipine  5 mg Oral Daily  . [START ON 09/09/2020] aspirin  EC  81 mg Oral BID  . heparin  5,000 Units Subcutaneous Q8H  . pravastatin  10 mg Oral QPM  . sodium chloride flush  3 mL Intravenous Q12H  . torsemide  40 mg Oral Daily   sodium chloride, acetaminophen, ALPRAZolam, melatonin, ondansetron (ZOFRAN) IV, sodium chloride flush  Assessment/ Plan:  86 y.o. male with hypertension, hyperlipidemia, aortic aneurysm, CKD, coronary artery disease, MGUS, history of sternotomy and aortic valve replacement admitted on 09/06/2020 for SOB (shortness of breath) [R06.02] Acute respiratory failure with hypoxia ([place]) [J96.01] Community acquired pneumonia, unspecified laterality [J18.9] Acute on chronic  congestive heart failure, unspecified heart failure type (HCC) [I50.9]  #Acute kidney injury on chronic kidney disease stage IIIb Baseline creatinine of 1.68/GFR 39 from August 10, 2020 AKI likely secondary to ATN from concurrent illness and possible pneumonia Continue to monitor renal parameters daily Serum creatinine trend is improving.  Urine output 1100 cc yesterday.  #Volume overload with lower extremity edema    Pleural effusions may be related to pneumonia At home, patient was taking furosemide 40 mg 1-2 times per day Diuretic changed to torsemide today  #large right pleural effusion Covid negative Antibiotics as per internal medicine team Thoracentesis performed September 08, 2020. Pulmonary consult and evaluation pending    LOS: 1 Evett Kassa 12/23/202111:19 AM  [place], [place]  Note: This note was prepared with Dragon dictation. Any transcription errors are unintentional

## 2020-09-08 NOTE — Consult Note (Signed)
ANTICOAGULATION CONSULT NOTE - Follow Up Consult  Pharmacy Consult for post-procedural ASA/heparin management   Labs: Recent Labs    09/06/20 2342 09/07/20 0143 09/08/20 0517  HGB 13.3  --   --   HCT 37.9*  --   --   PLT 225  --   --   CREATININE 2.19*  --  1.94*  TROPONINIHS 87* 82*  --      Medications:  ASA 81 mg BID Heparin 5,000 units Q8H   Assessment: Patient underwent IR procedure with bleeding risk deemed low   Plan:   Hold tonight's dose of aspirin and resume tomorrow at normal schedule  Reschedule next heparin dose to 4 hours following IR procedure   Dorothe Pea, PharmD, BCPS 09/08/2020,11:18 AM

## 2020-09-08 NOTE — Progress Notes (Signed)
SATURATION QUALIFICATIONS: (This note is used to comply with regulatory documentation for home oxygen)  Patient Saturations on Room Air at Rest = 95%  Patient Saturations on Room Air while Ambulating = 89%  Patient Saturations on 1 Liters of oxygen while Ambulating = 96%  Please briefly explain why patient needs home oxygen:

## 2020-09-08 NOTE — Progress Notes (Signed)
PROGRESS NOTE    [name]  QMV:[id number] DOB: 1931-07-11 DOA: 09/06/2020 PCP: Baxter Hire, MD   Brief Narrative: Taken from prior notes. [name] a 86 y.o.malewith medical history significant forchronic systolic CHF, chronic kidney disease stage IV, coronary artery disease, hypertension, and anxiety, presenting to emergency department with worsening shortness of breath. The patient reports that he developed progressive dyspnea beginning about a week ago, was started on Augmentin for suspected pneumonia a few days ago, but has continued to worsen despite that. He reports a productive cough but denies any fevers or chest pain. He has noted some slight increase in his chronic bilateral lower extremity swelling. He has been sleeping on 2 pillows recently. No calf tenderness or hemoptysis. Was hypoxic requiring 4 to 5 L of oxygen.  COVID-19 negative. CT chest with bilateral atelectasis/infiltrate and pleural effusions, right more than left.  Concern of underlying malignancy. Worsening renal function, unable to tolerate IV Lasix due to worsening renal function.  Had thoracentesis today. Pulmonology and nephrology on board  Subjective: Patient continued to feel quite short of breath, going for thoracentesis.  Wants to go home after the procedure.  Assessment & Plan:   Principal Problem:   Acute respiratory failure with hypoxia (HCC) Active Problems:   Benign essential HTN   CAD in native artery   Acute renal failure superimposed on stage 4 chronic kidney disease (HCC)   Elevated troponin   Pleural effusion   Acute on chronic systolic CHF (congestive heart failure) (HCC)   Thoracic compression fracture (HCC)  Acute hypoxic respiratory failure.  Multifactorial with acute on chronic HFrEF and bilateral pleural effusions, right more than left.  There is a mention of bilateral pleural nodule on a CT scan done in 2019.  Underlying atelectasis/mass/infiltrate.  Procalcitonin  remain negative.  Might be due to acute on chronic HFrEF as BNP is elevated at 3450. Patient was unable to tolerate IV Lasix with worsening in his renal function.  Nephrology was also consulted and he will start p.o. torsemide today. Cardiology was also consulted-still pending Pulmonology consult. Patient recently completed the course of Augmentin for possible pneumonia and received a dose of Levaquin in ED. no need to continue antibiotics at this time. -Follow-up thoracentesis labs. -Continue with torsemide. -Continue with supplemental oxygen to keep the saturation above 90%. -Continue with daily weight and BMP. -Strict intake and output.  Elevated troponin.87>>82.  No chest pain.  Most likely secondary to demand ischemia.  AKI with CKD stage IV.  Patient is very sensitive to IV diuresis which was discontinued yesterday with increase in his creatinine.  Creatinine improved to 1.94 today with baseline around 1.7.  Nephrology is on board. -Continue to monitor. -Keep holding ARB -Avoid nephrotoxins.  Hypertension.  Blood pressure within goal. -Continue home dose of Norvasc. -Holding ARB due to AKI.  Anxiety  - Continue low-dose Xanax as needed    T10 compression deformity  - Patient not complaining of pain, could be further assessed with MRI    Objective: Vitals:   09/08/20 0013 09/08/20 0441 09/08/20 0758 09/08/20 1015  BP: (!) 113/93 125/84 (!) 119/97 (!) 109/92  Pulse: 75 89 63 (!) 52  Resp: 17 17 17    Temp: 98.2 F (36.8 C) 98.2 F (36.8 C) (!) 97.5 F (36.4 C)   TempSrc:   Oral   SpO2: 96% 94% 96% 97%  Weight:      Height:        Intake/Output Summary (Last 24 hours)  at 09/08/2020 1423 Last data filed at 09/08/2020 1356 Gross per 24 hour  Intake 483 ml  Output --  Net 483 ml   Filed Weights   09/06/20 2334  Weight: 81.6 kg    Examination:  General exam: Appears calm and comfortable  Respiratory system: Decreased breath sound at mid to lower right zone  and left base, respiratory effort normal. Cardiovascular system: S1 & S2 heard, RRR. Gastrointestinal system: Soft, nontender, nondistended, bowel sounds positive. Central nervous system: Alert and oriented. No focal neurological deficits. Extremities: 1+ LE edema, no cyanosis, pulses intact and symmetrical. Psychiatry: Judgement and insight appear normal.    DVT prophylaxis: Subcu heparin Code Status: Full Family Communication: Discussed with daughter on phone. Disposition Plan:  Status is: Inpatient  Remains inpatient appropriate because:Inpatient level of care appropriate due to severity of illness   Dispo: The patient is from: Home              Anticipated d/c is to: Home              Anticipated d/c date is: 2 days              Patient currently is not medically stable to d/c.   Consultants:   Nephrology  Cardiology  Pulmonology  Procedures:  Antimicrobials:   Data Reviewed: I have personally reviewed following labs and imaging studies  CBC: Recent Labs  Lab 09/06/20 2342  WBC 7.7  NEUTROABS 4.5  HGB 13.3  HCT 37.9*  MCV 90.5  PLT 751   Basic Metabolic Panel: Recent Labs  Lab 09/06/20 2342 09/07/20 0452 09/08/20 0517  NA 142  --  138  K 3.5  --  3.5  CL 105  --  104  CO2 22  --  23  GLUCOSE 121*  --  103*  BUN 51*  --  42*  CREATININE 2.19*  --  1.94*  CALCIUM 8.9  --  8.9  MG  --  2.4  --    GFR: Estimated Creatinine Clearance: 27.4 mL/min (A) (by C-G formula based on SCr of 1.94 mg/dL (H)). Liver Function Tests: Recent Labs  Lab 09/06/20 2342  AST 35  ALT 32  ALKPHOS 138*  BILITOT 1.1  PROT 7.2  ALBUMIN 3.7   No results for input(s): LIPASE, AMYLASE in the last 168 hours. No results for input(s): AMMONIA in the last 168 hours. Coagulation Profile: No results for input(s): INR, PROTIME in the last 168 hours. Cardiac Enzymes: No results for input(s): CKTOTAL, CKMB, CKMBINDEX, TROPONINI in the last 168 hours. BNP (last 3  results) No results for input(s): PROBNP in the last 8760 hours. HbA1C: No results for input(s): HGBA1C in the last 72 hours. CBG: No results for input(s): GLUCAP in the last 168 hours. Lipid Profile: No results for input(s): CHOL, HDL, LDLCALC, TRIG, CHOLHDL, LDLDIRECT in the last 72 hours. Thyroid Function Tests: No results for input(s): TSH, T4TOTAL, FREET4, T3FREE, THYROIDAB in the last 72 hours. Anemia Panel: No results for input(s): VITAMINB12, FOLATE, FERRITIN, TIBC, IRON, RETICCTPCT in the last 72 hours. Sepsis Labs: Recent Labs  Lab 09/07/20 0452 09/08/20 0517  PROCALCITON <0.10 <0.10    Recent Results (from the past 240 hour(s))  Resp Panel by RT-PCR (Flu A&B, Covid) Nasopharyngeal Swab     Status: None   Collection Time: 09/06/20 11:42 PM   Specimen: Nasopharyngeal Swab; Nasopharyngeal(NP) swabs in vial transport medium  Result Value Ref Range Status   SARS Coronavirus 2 by RT PCR  NEGATIVE NEGATIVE Final    Comment: (NOTE) SARS-CoV-2 target nucleic acids are NOT DETECTED.  The SARS-CoV-2 RNA is generally detectable in upper respiratory specimens during the acute phase of infection. The lowest concentration of SARS-CoV-2 viral copies this assay can detect is 138 copies/mL. A negative result does not preclude SARS-Cov-2 infection and should not be used as the sole basis for treatment or other patient management decisions. A negative result may occur with  improper specimen collection/handling, submission of specimen other than nasopharyngeal swab, presence of viral mutation(s) within the areas targeted by this assay, and inadequate number of viral copies(<138 copies/mL). A negative result must be combined with clinical observations, patient history, and epidemiological information. The expected result is Negative.  Fact Sheet for Patients:  EntrepreneurPulse.com.au  Fact Sheet for Healthcare Providers:   IncredibleEmployment.be  This test is no t yet approved or cleared by the [place] FDA and  has been authorized for detection and/or diagnosis of SARS-CoV-2 by FDA under an Emergency Use Authorization (EUA). This EUA will remain  in effect (meaning this test can be used) for the duration of the COVID-19 declaration under Section 564(b)(1) of the Act, 21 U.S.C.section 360bbb-3(b)(1), unless the authorization is terminated  or revoked sooner.       Influenza A by PCR NEGATIVE NEGATIVE Final   Influenza B by PCR NEGATIVE NEGATIVE Final    Comment: (NOTE) The Xpert Xpress SARS-CoV-2/FLU/RSV plus assay is intended as an aid in the diagnosis of influenza from Nasopharyngeal swab specimens and should not be used as a sole basis for treatment. Nasal washings and aspirates are unacceptable for Xpert Xpress SARS-CoV-2/FLU/RSV testing.  Fact Sheet for Patients: EntrepreneurPulse.com.au  Fact Sheet for Healthcare Providers: IncredibleEmployment.be  This test is not yet approved or cleared by the [place] FDA and has been authorized for detection and/or diagnosis of SARS-CoV-2 by FDA under an Emergency Use Authorization (EUA). This EUA will remain in effect (meaning this test can be used) for the duration of the COVID-19 declaration under Section 564(b)(1) of the Act, 21 U.S.C. section 360bbb-3(b)(1), unless the authorization is terminated or revoked.  Performed at [facility], [address] [place], [place] [address]   Blood culture (routine x 2)     Status: None (Preliminary result)   Collection Time: 09/06/20 11:42 PM   Specimen: BLOOD  Result Value Ref Range Status   Specimen Description BLOOD RIGHT ASSIST CONTROL  Final   Special Requests   Final    BOTTLES DRAWN AEROBIC AND ANAEROBIC Blood Culture adequate volume   Culture   Final    NO GROWTH 1 DAY Performed at [facility], [address] [place], [place] [address]    Report Status PENDING  Incomplete  Blood culture (routine x 2)     Status: None (Preliminary result)   Collection Time: 09/06/20 11:42 PM   Specimen: BLOOD  Result Value Ref Range Status   Specimen Description BLOOD LEFT ASSIST CONTROL  Final   Special Requests   Final    BOTTLES DRAWN AEROBIC AND ANAEROBIC Blood Culture results may not be optimal due to an excessive volume of blood received in culture bottles   Culture   Final    NO GROWTH 1 DAY Performed at [facility], [address], [place], [place] [address]    Report Status PENDING  Incomplete     Radiology Studies: DG Chest 1 View  Result Date: 09/08/2020 CLINICAL DATA:  Post biopsy, RIGHT lung pneumothorax EXAM:  CHEST  1 VIEW COMPARISON:  Portable exam 1304 hours compared to 1053 hours FINDINGS: Enlargement of cardiac silhouette post CABG and AVR. Mediastinal contours and pulmonary vascularity normal. Atherosclerotic calcification aorta. Tiny persistent RIGHT apex pneumothorax. Bibasilar pleural effusions and atelectasis. Questionable perihilar infiltrate. Bones demineralized. IMPRESSION: Persistent tiny RIGHT apex pneumothorax. Electronically Signed   By: Lavonia Dana M.D.   On: 09/08/2020 13:11   CT Chest Wo Contrast  Result Date: 09/07/2020 CLINICAL DATA:  Cough and shortness of breath. EXAM: CT CHEST WITHOUT CONTRAST TECHNIQUE: Multidetector CT imaging of the chest was performed following the standard protocol without IV contrast. COMPARISON:  Jan 27, 2018 FINDINGS: Cardiovascular: There is marked severity calcification of the aortic arch and descending thoracic aorta. Calcification of the ascending aorta is also noted. There is mild cardiomegaly with marked severity coronary artery calcification. An artificial aortic valve is seen. No pericardial effusion. Mediastinum/Nodes: Multiple sternal wires are present. Multiple subcentimeter calcified and noncalcified pretracheal and AP  window lymph nodes are seen. The thyroid gland, trachea and esophagus demonstrate no significant abnormalities. Lungs/Pleura: Moderate to marked severity areas of consolidation seen within the bilateral lower lobes. These areas contain large clusters of tiny hyperdense foci. A 7 mm calcified granuloma is seen within the anterior aspect of the right middle lobe. There is a moderate-sized right pleural effusion. A small left pleural effusion is also seen. No pneumothorax is identified. Upper Abdomen: Right upper quadrant surgical clips are seen. Bilateral renal cysts are noted. Musculoskeletal: A compression fracture deformity of indeterminate age is seen at the level of T10. This represents a new finding when compared to the prior study. Multiple chronic left-sided rib fractures are also seen. Degenerative changes are noted throughout the thoracic spine. IMPRESSION: 1. Moderate to marked severity areas of bilateral lower lobe consolidation likely secondary to areas of atelectasis and/or infiltrate. Follow-up to resolution is recommended to exclude the presence of an underlying neoplastic process. 2. Moderate-sized right and small left pleural effusions. 3. Mild cardiomegaly with marked severity coronary artery calcification. 4. Evidence of prior median sternotomy and aortic valve replacement. 5. Bilateral renal cysts. 6. Compression fracture deformity of indeterminate age at the level of T10. This represents a new finding when compared to the prior study. MRI correlation is recommended. 7. Multiple chronic left-sided rib fractures. 8. Aortic atherosclerosis. Aortic Atherosclerosis (ICD10-I70.0). Electronically Signed   By: Virgina Norfolk M.D.   On: 09/07/2020 01:47   DG Chest Port 1 View  Result Date: 09/08/2020 CLINICAL DATA:  Status post thoracentesis EXAM: PORTABLE CHEST 1 VIEW COMPARISON:  CT chest dated 09/07/2020 FINDINGS: Small right pleural effusion, decreased status post thoracentesis. Suspected tiny  right apical pneumothorax. Small left pleural effusion. Associated left lower lobe opacity, likely atelectasis. Cardiomegaly. Prosthetic valve. Postsurgical changes related to prior CABG. Median sternotomy. IMPRESSION: Suspected tiny right apical pneumothorax. These results were notified at the time of interpretation on 09/08/2020 at 11:15 am to provider DANIEL HASSELL , who acknowledged these results. Small right pleural effusion, decreased status post thoracentesis. Small left pleural effusion. Associated left lower lobe opacity, likely atelectasis. Electronically Signed   By: Julian Hy M.D.   On: 09/08/2020 11:16   DG Chest Portable 1 View  Result Date: 09/07/2020 CLINICAL DATA:  Shortness of breath. EXAM: PORTABLE CHEST 1 VIEW COMPARISON:  August 07, 2020 FINDINGS: Multiple sternal wires are seen. Diffusely prominent interstitial lung markings are noted with mild to moderate severity areas of atelectasis and/or infiltrate within the bilateral lung bases. A stable moderate  size right pleural effusion is seen with a small, stable left pleural effusion. No pneumothorax is identified. There is mild to moderate severity enlargement of the cardiac silhouette. An artificial aortic valve is also noted. There is moderate severity calcification of the aortic arch. Degenerative changes seen throughout the thoracic spine. IMPRESSION: 1. Evidence of prior median sternotomy/CABG. 2. Stable bilateral pleural effusions with mild to moderate severity bibasilar atelectasis and/or infiltrate. Electronically Signed   By: Virgina Norfolk M.D.   On: 09/07/2020 00:22   US THORACENTESIS ASP PLEURAL SPACE W/IMG GUIDE  Result Date: 09/08/2020 INDICATION: Cough, shortness of breath, pleural effusions right greater than left EXAM: ULTRASOUND GUIDED RIGHT THORACENTESIS MEDICATIONS: Lidocaine 1% subcutaneous COMPLICATIONS: Possible small postprocedure pneumothorax on portable chest radiograph, asymptomatic. SIR LEVEL B  - Normal therapy, includes overnight admission for observation. PROCEDURE: An ultrasound guided thoracentesis was thoroughly discussed with the patient and questions answered. The benefits, risks, alternatives and complications were also discussed. The patient understands and wishes to proceed with the procedure. Written consent was obtained. Ultrasound was performed to localize and mark an adequate pocket of fluid in the right chest. The area was then prepped and draped in the normal sterile fashion. 1% Lidocaine was used for local anesthesia. Under ultrasound guidance a 6 Fr Safe-T-Centesis catheter was introduced. Thoracentesis was performed. The catheter was removed and a dressing applied. FINDINGS: A total of approximately 1.2 L of clear yellow fluid was removed. Samples were sent to the laboratory as requested by the clinical team. IMPRESSION: Successful ultrasound guided right thoracentesis yielding 1.2 L of pleural fluid. Electronically Signed   By: Lucrezia Europe M.D.   On: 09/08/2020 12:50    Scheduled Meds: . amLODipine  5 mg Oral Daily  . [START ON 09/09/2020] aspirin EC  81 mg Oral BID  . heparin  5,000 Units Subcutaneous Q8H  . pravastatin  10 mg Oral QPM  . sodium chloride flush  3 mL Intravenous Q12H  . torsemide  40 mg Oral Daily   Continuous Infusions: . sodium chloride       LOS: 1 day   Time spent: 35 minutes.  Lorella Nimrod, MD [facility]  If 7PM-7AM, please contact night-coverage [email or web address]  09/08/2020, 2:23 PM   This record has been created using Systems analyst. Errors have been sought and corrected,but may not always be located. Such creation errors do not reflect on the standard of care.

## 2020-09-08 NOTE — Consult Note (Signed)
Pulmonary Medicine          Date: 09/08/2020,   MRN# [id number] KORON GODEAUX August 16, 1931     AdmissionWeight: 81.6 kg                 CurrentWeight: 81.6 kg   Referring physician: Dr Reesa Chew   CHIEF COMPLAINT:   Large pleural effusion    HISTORY OF PRESENT ILLNESS    CHARES SLAYMAKER is a 86 y.o. male with medical history significant for chronic systolic CHF, chronic kidney disease stage IV, coronary artery disease, hypertension, and anxiety, presenting to emergency department with worsening shortness of breath. Also complained of bilateral LE edema which is worsening. In ED saturating 80% on room air, tachypneic, and with stable blood pressure. EKG features and undetermined rhythm with PVCs and IVCD. Chest x-ray notable for bilateral pleural effusions and atelectasis versus infiltrate. CT chest demonstrates moderate right and small left pleural effusions with consolidation in the bilateral lower lobes which could reflect atelectasis, infection, or possibly underlying malignancy.  Chemistry panel notable for creatinine 2.19, up from 1.68 last month. CBC is unremarkable. High-sensitivity troponin is 87 then 82. BNP is elevated to 3000.49. COVID-19 PCR is negative. Blood cultures were collected in the ED and 750 mg IV Levaquin. He had thoracentesis done and 1.2L of fluid was removed. He feels better after procedure.    PAST MEDICAL HISTORY   Past Medical History:  Diagnosis Date  . Anemia   . Aortic aneurysm ([place])   . Aortic aneurysm ([place])   . Arthritis   . CKD (chronic kidney disease)    ALSO LEFT RENAL MASS  . Coronary artery disease   . Diverticulosis    WITH RUPTURE  . GERD (gastroesophageal reflux disease)   . History of hiatal hernia   . Hypercholesteremia   . Hypertension   . MGUS (monoclonal gammopathy of unknown significance)   . MGUS (monoclonal gammopathy of unknown significance)      SURGICAL HISTORY   Past Surgical History:  Procedure Laterality  Date  . AORTIC VALVE REPLACEMENT    . bipass    . CARDIAC VALVE REPLACEMENT     pig valve  . CHOLECYSTECTOMY    . COLON SURGERY    . CORONARY ANGIOPLASTY    . CORONARY ARTERY BYPASS GRAFT    . ESOPHAGOGASTRODUODENOSCOPY (EGD) WITH PROPOFOL N/A 02/07/2015   Procedure: ESOPHAGOGASTRODUODENOSCOPY (EGD) WITH PROPOFOL;  Surgeon: Hulen Luster, MD;  Location: [facility] ENDOSCOPY;  Service: Gastroenterology;  Laterality: N/A;  . EYE SURGERY    . HERNIA REPAIR    . INTRAMEDULLARY (IM) NAIL INTERTROCHANTERIC Right 05/12/2020   Procedure: INTRAMEDULLARY (IM) NAIL INTERTROCHANTRIC;  Surgeon: Lovell Sheehan, MD;  Location: [facility] ORS;  Service: Orthopedics;  Laterality: Right;  . JOINT REPLACEMENT     left knee x4  . KNEE ARTHROPLASTY Right 04/02/2016   Procedure: COMPUTER ASSISTED TOTAL KNEE ARTHROPLASTY;  Surgeon: Dereck Leep, MD;  Location: [facility] ORS;  Service: Orthopedics;  Laterality: Right;  . ORIF FEMUR FRACTURE Right 08/08/2020   Procedure: OPEN REDUCTION INTERNAL FIXATION (ORIF) DISTAL FEMUR FRACTURE;  Surgeon: Lovell Sheehan, MD;  Location: [facility] ORS;  Service: Orthopedics;  Laterality: Right;     FAMILY HISTORY   Family History  Problem Relation Age of Onset  . Bladder Cancer Neg Hx   . Prostate cancer Neg Hx   . Kidney cancer Neg Hx      SOCIAL HISTORY   Social History   Tobacco  Use  . Smoking status: Former Smoker  . Smokeless tobacco: Never Used  Substance Use Topics  . Alcohol use: No  . Drug use: No     MEDICATIONS    Home Medication:    Current Medication:  Current Facility-Administered Medications:  .  0.9 %  sodium chloride infusion, 250 mL, Intravenous, PRN, Opyd, Ilene Qua, MD .  acetaminophen (TYLENOL) tablet 650 mg, 650 mg, Oral, Q4H PRN, Opyd, Ilene Qua, MD .  ALPRAZolam Duanne Moron) tablet 0.25 mg, 0.25 mg, Oral, BID PRN, Opyd, Timothy S, MD .  amLODipine (NORVASC) tablet 5 mg, 5 mg, Oral, Daily, Opyd, Ilene Qua, MD, 5 mg at 09/08/20 1126 .  [START ON  09/09/2020] aspirin EC tablet 81 mg, 81 mg, Oral, BID, Dorothe Pea, RPH .  heparin injection 5,000 Units, 5,000 Units, Subcutaneous, Q8H, Dorothe Pea, RPH, 5,000 Units at 09/08/20 1505 .  melatonin tablet 2.5 mg, 2.5 mg, Oral, QHS PRN, Opyd, Timothy S, MD .  ondansetron (ZOFRAN) injection 4 mg, 4 mg, Intravenous, Q6H PRN, Opyd, Timothy S, MD .  pravastatin (PRAVACHOL) tablet 10 mg, 10 mg, Oral, QPM, Opyd, Ilene Qua, MD, 10 mg at 09/08/20 1711 .  sodium chloride flush (NS) 0.9 % injection 3 mL, 3 mL, Intravenous, Q12H, Opyd, Ilene Qua, MD, 3 mL at 09/08/20 1127 .  sodium chloride flush (NS) 0.9 % injection 3 mL, 3 mL, Intravenous, PRN, Opyd, Ilene Qua, MD .  torsemide (DEMADEX) tablet 40 mg, 40 mg, Oral, Daily, Lorella Nimrod, MD, 40 mg at 09/08/20 1126    ALLERGIES   Morphine, Oxycodone, Carvedilol, Iodinated diagnostic agents, and Lisinopril     REVIEW OF SYSTEMS    Review of Systems:  Gen:  Denies  fever, sweats, chills weigh loss  HEENT: Denies blurred vision, double vision, ear pain, eye pain, hearing loss, nose bleeds, sore throat Cardiac:  No dizziness, chest pain or heaviness, chest tightness,edema Resp:   + sputum porduction, shortness of breath,wheezing, hemoptysis,  Gi: Denies swallowing difficulty, stomach pain, nausea or vomiting, diarrhea, constipation, bowel incontinence Gu:  Denies bladder incontinence, burning urine Ext:   Denies Joint pain, stiffness or swelling Skin: Denies  skin rash, easy bruising or bleeding or hives Endoc:  Denies polyuria, polydipsia , polyphagia or weight change Psych:   Denies depression, insomnia or hallucinations   Other:  All other systems negative   VS: BP 113/74 (BP Location: Left Arm)   Pulse 90   Temp 97.8 F (36.6 C) (Oral)   Resp 17   Ht 5\' 7"  (1.702 m)   Wt 81.6 kg   SpO2 98%   BMI 28.19 kg/m      PHYSICAL EXAM    GENERAL:NAD, no fevers, chills, no weakness no fatigue HEAD: Normocephalic, atraumatic.   EYES: Pupils equal, round, reactive to light. Extraocular muscles intact. No scleral icterus.  MOUTH: Moist mucosal membrane. Dentition intact. No abscess noted.  EAR, NOSE, THROAT: Clear without exudates. No external lesions.  NECK: Supple. No thyromegaly. No nodules. No JVD.  PULMONARY: rhonchi bilaterally  CARDIOVASCULAR: S1 and S2. Regular rate and rhythm. No murmurs, rubs, or gallops. No edema. Pedal pulses 2+ bilaterally.  GASTROINTESTINAL: Soft, nontender, nondistended. No masses. Positive bowel sounds. No hepatosplenomegaly.  MUSCULOSKELETAL: No swelling, clubbing, or edema. Range of motion full in all extremities.  NEUROLOGIC: Cranial nerves II through XII are intact. No gross focal neurological deficits. Sensation intact. Reflexes intact.  SKIN: No ulceration, lesions, rashes, or cyanosis. Skin warm and dry. Turgor intact.  PSYCHIATRIC: Mood, affect within normal limits. The patient is awake, alert and oriented x 3. Insight, judgment intact.       IMAGING    DG Chest 1 View  Result Date: 09/08/2020 CLINICAL DATA:  Post biopsy, RIGHT lung pneumothorax EXAM: CHEST  1 VIEW COMPARISON:  Portable exam 1304 hours compared to 1053 hours FINDINGS: Enlargement of cardiac silhouette post CABG and AVR. Mediastinal contours and pulmonary vascularity normal. Atherosclerotic calcification aorta. Tiny persistent RIGHT apex pneumothorax. Bibasilar pleural effusions and atelectasis. Questionable perihilar infiltrate. Bones demineralized. IMPRESSION: Persistent tiny RIGHT apex pneumothorax. Electronically Signed   By: Lavonia Dana M.D.   On: 09/08/2020 13:11   CT Chest Wo Contrast  Result Date: 09/07/2020 CLINICAL DATA:  Cough and shortness of breath. EXAM: CT CHEST WITHOUT CONTRAST TECHNIQUE: Multidetector CT imaging of the chest was performed following the standard protocol without IV contrast. COMPARISON:  Jan 27, 2018 FINDINGS: Cardiovascular: There is marked severity calcification of the  aortic arch and descending thoracic aorta. Calcification of the ascending aorta is also noted. There is mild cardiomegaly with marked severity coronary artery calcification. An artificial aortic valve is seen. No pericardial effusion. Mediastinum/Nodes: Multiple sternal wires are present. Multiple subcentimeter calcified and noncalcified pretracheal and AP window lymph nodes are seen. The thyroid gland, trachea and esophagus demonstrate no significant abnormalities. Lungs/Pleura: Moderate to marked severity areas of consolidation seen within the bilateral lower lobes. These areas contain large clusters of tiny hyperdense foci. A 7 mm calcified granuloma is seen within the anterior aspect of the right middle lobe. There is a moderate-sized right pleural effusion. A small left pleural effusion is also seen. No pneumothorax is identified. Upper Abdomen: Right upper quadrant surgical clips are seen. Bilateral renal cysts are noted. Musculoskeletal: A compression fracture deformity of indeterminate age is seen at the level of T10. This represents a new finding when compared to the prior study. Multiple chronic left-sided rib fractures are also seen. Degenerative changes are noted throughout the thoracic spine. IMPRESSION: 1. Moderate to marked severity areas of bilateral lower lobe consolidation likely secondary to areas of atelectasis and/or infiltrate. Follow-up to resolution is recommended to exclude the presence of an underlying neoplastic process. 2. Moderate-sized right and small left pleural effusions. 3. Mild cardiomegaly with marked severity coronary artery calcification. 4. Evidence of prior median sternotomy and aortic valve replacement. 5. Bilateral renal cysts. 6. Compression fracture deformity of indeterminate age at the level of T10. This represents a new finding when compared to the prior study. MRI correlation is recommended. 7. Multiple chronic left-sided rib fractures. 8. Aortic atherosclerosis. Aortic  Atherosclerosis (ICD10-I70.0). Electronically Signed   By: Virgina Norfolk M.D.   On: 09/07/2020 01:47   DG Chest Port 1 View  Result Date: 09/08/2020 CLINICAL DATA:  Status post thoracentesis EXAM: PORTABLE CHEST 1 VIEW COMPARISON:  CT chest dated 09/07/2020 FINDINGS: Small right pleural effusion, decreased status post thoracentesis. Suspected tiny right apical pneumothorax. Small left pleural effusion. Associated left lower lobe opacity, likely atelectasis. Cardiomegaly. Prosthetic valve. Postsurgical changes related to prior CABG. Median sternotomy. IMPRESSION: Suspected tiny right apical pneumothorax. These results were notified at the time of interpretation on 09/08/2020 at 11:15 am to provider DANIEL HASSELL , who acknowledged these results. Small right pleural effusion, decreased status post thoracentesis. Small left pleural effusion. Associated left lower lobe opacity, likely atelectasis. Electronically Signed   By: Julian Hy M.D.   On: 09/08/2020 11:16   DG Chest Portable 1 View  Result Date: 09/07/2020 CLINICAL DATA:  Shortness of breath. EXAM: PORTABLE CHEST 1 VIEW COMPARISON:  August 07, 2020 FINDINGS: Multiple sternal wires are seen. Diffusely prominent interstitial lung markings are noted with mild to moderate severity areas of atelectasis and/or infiltrate within the bilateral lung bases. A stable moderate size right pleural effusion is seen with a small, stable left pleural effusion. No pneumothorax is identified. There is mild to moderate severity enlargement of the cardiac silhouette. An artificial aortic valve is also noted. There is moderate severity calcification of the aortic arch. Degenerative changes seen throughout the thoracic spine. IMPRESSION: 1. Evidence of prior median sternotomy/CABG. 2. Stable bilateral pleural effusions with mild to moderate severity bibasilar atelectasis and/or infiltrate. Electronically Signed   By: Virgina Norfolk M.D.   On: 09/07/2020  00:22   US THORACENTESIS ASP PLEURAL SPACE W/IMG GUIDE  Result Date: 09/08/2020 INDICATION: Cough, shortness of breath, pleural effusions right greater than left EXAM: ULTRASOUND GUIDED RIGHT THORACENTESIS MEDICATIONS: Lidocaine 1% subcutaneous COMPLICATIONS: Possible small postprocedure pneumothorax on portable chest radiograph, asymptomatic. SIR LEVEL B - Normal therapy, includes overnight admission for observation. PROCEDURE: An ultrasound guided thoracentesis was thoroughly discussed with the patient and questions answered. The benefits, risks, alternatives and complications were also discussed. The patient understands and wishes to proceed with the procedure. Written consent was obtained. Ultrasound was performed to localize and mark an adequate pocket of fluid in the right chest. The area was then prepped and draped in the normal sterile fashion. 1% Lidocaine was used for local anesthesia. Under ultrasound guidance a 6 Fr Safe-T-Centesis catheter was introduced. Thoracentesis was performed. The catheter was removed and a dressing applied. FINDINGS: A total of approximately 1.2 L of clear yellow fluid was removed. Samples were sent to the laboratory as requested by the clinical team. IMPRESSION: Successful ultrasound guided right thoracentesis yielding 1.2 L of pleural fluid. Electronically Signed   By: Lucrezia Europe M.D.   On: 09/08/2020 12:50   INTERPRETATION  MILD SEGMENTAL LV SYSTOLIC DYSFUNCTION WITH AN ESTIMATED EF = 40-45 %  MILD RV SYSTOLIC DYSFUNCTION (See above)  MODERATE TRICUSPID VALVE INSUFFICIENCY  MILD-TO-MODERATE MITRAL VALVE INSUFFICIENCY  TRACE AORTIC VALVE INSUFFICIENCY  NO VALVULAR STENOSIS  MODERATE RV ENLARGEMENT  MODERATE RA ENLARGEMENT  MILD LV ENLARGEMENT  MILD LA ENLARGEMENT  MILDLY DILATED AORTIC ROOT MEASURING UP TO 4.0 cm  NORMAL BIOPROSTHETIC AORTIC VALVE FUNCTION  _________________________________________________________________________________________   Electronically signed by   MD Serafina Royals on 04/19/2020 09: 28 AM      Performed By: Johnathan Hausen, RDCS, RVT   Ordering Physician: Nehemiah Massed, MD [place]     ASSESSMENT/PLAN   Right pleural effusion    - Lymphocyte predominant transudate with normal glucose consistent with simple non-parapneumonic effusion.  -additional studies are pending  - likey due to renal impairment and systolic CHF -TTE 03/2093 - HFrEF -  Ischemic cardiomyopathy s/p CABG and multi valvular pathology  -patient with MGUS - query regarding Amyloidosis    Thank you for allowing me to participate in the care of this patient.   Patient/Family are satisfied with care plan and all questions have been answered.  This document was prepared using Dragon voice recognition software and may include unintentional dictation errors.     Ottie Glazier, M.D.  Division of [facility]

## 2020-09-08 NOTE — Progress Notes (Addendum)
CSW acknowledges consult to follow up on Home Health/DME needs. Please place PT/OT consults if appropriate and CSW will follow-up based on their recommendations.  1:30- CSW informed by MD that patient may need home o2. CSW asked RN to complete qualifying sats when able.   3:57- Qualifying sats note in. Referral made to [employer]. Informed him plan for DC tomorrow. Asked MD for orders.  [name], [place]

## 2020-09-08 NOTE — Procedures (Signed)
  Procedure: US thoracentesis right   EBL:   minimal Complications:  none immediate  See full dictation in [facility].  Dillard Cannon MD Main # [phone] Pager  [phone] Mobile [phone]

## 2020-09-09 ENCOUNTER — Inpatient Hospital Stay: Payer: PPO

## 2020-09-09 LAB — BASIC METABOLIC PANEL
Anion gap: 11 (ref 5–15)
BUN: 42 mg/dL — ABNORMAL HIGH (ref 8–23)
CO2: 24 mmol/L (ref 22–32)
Calcium: 8.9 mg/dL (ref 8.9–10.3)
Chloride: 106 mmol/L (ref 98–111)
Creatinine, Ser: 2.24 mg/dL — ABNORMAL HIGH (ref 0.61–1.24)
GFR, Estimated: 28 mL/min — ABNORMAL LOW (ref >60)
Glucose, Bld: 96 mg/dL (ref 70–99)
Potassium: 3.5 mmol/L (ref 3.5–5.1)
Sodium: 141 mmol/L (ref 135–145)

## 2020-09-09 LAB — PROTEIN, BODY FLUID (OTHER): Total Protein, Body Fluid Other: 2.3 g/dL

## 2020-09-09 LAB — PROCALCITONIN: Procalcitonin: <0.1 ng/mL

## 2020-09-09 MED ORDER — TORSEMIDE 20 MG PO TABS: 40.0000 mg | ORAL_TABLET | Freq: Every day | ORAL | 1 refills | Status: DC

## 2020-09-09 NOTE — Progress Notes (Signed)
PROGRESS NOTE    [name]  OBS:[id number] DOB: 1930/11/01 DOA: 09/06/2020 PCP: Baxter Hire, MD   Brief Narrative: Taken from prior notes. [name] a 86 y.o.malewith medical history significant forchronic systolic CHF, chronic kidney disease stage IV, coronary artery disease, hypertension, and anxiety, presenting to emergency department with worsening shortness of breath. The patient reports that he developed progressive dyspnea beginning about a week ago, was started on Augmentin for suspected pneumonia a few days ago, but has continued to worsen despite that. He reports a productive cough but denies any fevers or chest pain. He has noted some slight increase in his chronic bilateral lower extremity swelling. He has been sleeping on 2 pillows recently. No calf tenderness or hemoptysis. Was hypoxic requiring 4 to 5 L of oxygen.  COVID-19 negative. CT chest with bilateral atelectasis/infiltrate and pleural effusions, right more than left.  Concern of underlying malignancy. Worsening renal function, unable to tolerate IV Lasix due to worsening renal function.  Had thoracentesis today. Pulmonology and nephrology on board  Subjective: Patient breathing improved with thoracentesis.  He was asking if it can be done on the left side, still little tachypneic.  Overall feeling weak.  Assessment & Plan:   Principal Problem:   Acute respiratory failure with hypoxia (HCC) Active Problems:   Benign essential HTN   CAD in native artery   Acute renal failure superimposed on stage 4 chronic kidney disease (HCC)   Elevated troponin   Pleural effusion   Acute on chronic systolic CHF (congestive heart failure) (HCC)   Thoracic compression fracture (HCC)  Acute hypoxic respiratory failure.  Multifactorial with acute on chronic HFrEF and bilateral pleural effusions, right more than left.  There is a mention of bilateral pleural nodule on a CT scan done in 2019.  Underlying  atelectasis/mass/infiltrate.  Procalcitonin remain negative.  Might be due to acute on chronic HFrEF as BNP is elevated at 3450. Patient was unable to tolerate IV Lasix with worsening in his renal function.  Nephrology was also consulted and he will start p.o. torsemide. . Cardiology was also consulted-shared their recommendations. Pulmonology consulted. Patient recently completed the course of Augmentin for possible pneumonia and received a dose of Levaquin in ED. no need to continue antibiotics at this time. Able to wean him off from oxygen. -Continue with torsemide. -Continue with daily weight and BMP. -Strict intake and output.  Bilateral pleural effusions.  CT chest with bilateral pleural effusions, right more than left.  Underwent right-sided thoracentesis with removal of 1.2 L, preliminary labs with transudate of fluid which is more consistent with CHF/CKD. Repeat chest x-ray seems stable. -Continue to monitor-can do left-sided thoracentesis if worsening respiratory status or left-sided pleural effusion.  Elevated troponin.87>>82.  No chest pain.  Most likely secondary to demand ischemia.  AKI with CKD stage IV.  Patient is very sensitive to IV diuresis which was discontinued yesterday with increase in his creatinine.  Creatinine little worse at 2.24 today with baseline around 1.7.  Nephrology is on board. Discussed with nephrology and we will discontinue his amlodipine and continue with torsemide, I still think that softer blood pressure might be playing a role in worsening renal function. -Continue to monitor. -Keep holding ARB -Avoid nephrotoxins.  Hypertension.  Blood pressure on softer side. -Discontinue home dose of Norvasc. -Keep holding ARB due to AKI.  Anxiety  - Continue low-dose Xanax as needed    T10 compression deformity  - Patient not complaining of pain, could be further  assessed with MRI    Objective: Vitals:   09/09/20 0555 09/09/20 0732 09/09/20 1130  09/09/20 1219  BP: 109/62 113/83 (!) 88/63 90/64  Pulse: 91 81 99 76  Resp: 17     Temp: 97.6 F (36.4 C) 97.8 F (36.6 C) 97.7 F (36.5 C)   TempSrc: Oral Oral Oral   SpO2: 96% 99% 95% 96%  Weight:      Height:        Intake/Output Summary (Last 24 hours) at 09/09/2020 1324 Last data filed at 09/09/2020 1052 Gross per 24 hour  Intake 840 ml  Output 2050 ml  Net -1210 ml   Filed Weights   09/06/20 2334  Weight: 81.6 kg    Examination:  General.  Chronically ill-appearing elderly man, in no acute distress. Pulmonary.  Decreased breath sounds at left base, normal respiratory effort. CV.  Regular rate and rhythm, no JVD, rub or murmur. Abdomen.  Soft, nontender, nondistended, BS positive. CNS.  Alert and oriented x3.  No focal neurologic deficit. Extremities.  No edema, no cyanosis, pulses intact and symmetrical. Psychiatry.  Judgment and insight appears normal.   DVT prophylaxis: Subcu heparin Code Status: Full Family Communication: Discussed with daughter on phone. Disposition Plan:  Status is: Inpatient  Remains inpatient appropriate because:Inpatient level of care appropriate due to severity of illness   Dispo: The patient is from: Home              Anticipated d/c is to: Home              Anticipated d/c date is: 2 days              Patient currently is not medically stable to d/c.   Consultants:   Nephrology  Cardiology  Pulmonology  Procedures:  Antimicrobials:   Data Reviewed: I have personally reviewed following labs and imaging studies  CBC: Recent Labs  Lab 09/06/20 2342  WBC 7.7  NEUTROABS 4.5  HGB 13.3  HCT 37.9*  MCV 90.5  PLT 016   Basic Metabolic Panel: Recent Labs  Lab 09/06/20 2342 09/07/20 0452 09/08/20 0517 09/09/20 0559  NA 142  --  138 141  K 3.5  --  3.5 3.5  CL 105  --  104 106  CO2 22  --  23 24  GLUCOSE 121*  --  103* 96  BUN 51*  --  42* 42*  CREATININE 2.19*  --  1.94* 2.24*  CALCIUM 8.9  --  8.9 8.9   MG  --  2.4  --   --    GFR: Estimated Creatinine Clearance: 23.8 mL/min (A) (by C-G formula based on SCr of 2.24 mg/dL (H)). Liver Function Tests: Recent Labs  Lab 09/06/20 2342  AST 35  ALT 32  ALKPHOS 138*  BILITOT 1.1  PROT 7.2  ALBUMIN 3.7   No results for input(s): LIPASE, AMYLASE in the last 168 hours. No results for input(s): AMMONIA in the last 168 hours. Coagulation Profile: No results for input(s): INR, PROTIME in the last 168 hours. Cardiac Enzymes: No results for input(s): CKTOTAL, CKMB, CKMBINDEX, TROPONINI in the last 168 hours. BNP (last 3 results) No results for input(s): PROBNP in the last 8760 hours. HbA1C: No results for input(s): HGBA1C in the last 72 hours. CBG: No results for input(s): GLUCAP in the last 168 hours. Lipid Profile: No results for input(s): CHOL, HDL, LDLCALC, TRIG, CHOLHDL, LDLDIRECT in the last 72 hours. Thyroid Function Tests: No results for input(s):  TSH, T4TOTAL, FREET4, T3FREE, THYROIDAB in the last 72 hours. Anemia Panel: No results for input(s): VITAMINB12, FOLATE, FERRITIN, TIBC, IRON, RETICCTPCT in the last 72 hours. Sepsis Labs: Recent Labs  Lab 09/07/20 0452 09/08/20 0517 09/09/20 0559  PROCALCITON <0.10 <0.10 <0.10    Recent Results (from the past 240 hour(s))  Resp Panel by RT-PCR (Flu A&B, Covid) Nasopharyngeal Swab     Status: None   Collection Time: 09/06/20 11:42 PM   Specimen: Nasopharyngeal Swab; Nasopharyngeal(NP) swabs in vial transport medium  Result Value Ref Range Status   SARS Coronavirus 2 by RT PCR NEGATIVE NEGATIVE Final    Comment: (NOTE) SARS-CoV-2 target nucleic acids are NOT DETECTED.  The SARS-CoV-2 RNA is generally detectable in upper respiratory specimens during the acute phase of infection. The lowest concentration of SARS-CoV-2 viral copies this assay can detect is 138 copies/mL. A negative result does not preclude SARS-Cov-2 infection and should not be used as the sole basis for  treatment or other patient management decisions. A negative result may occur with  improper specimen collection/handling, submission of specimen other than nasopharyngeal swab, presence of viral mutation(s) within the areas targeted by this assay, and inadequate number of viral copies(<138 copies/mL). A negative result must be combined with clinical observations, patient history, and epidemiological information. The expected result is Negative.  Fact Sheet for Patients:  EntrepreneurPulse.com.au  Fact Sheet for Healthcare Providers:  IncredibleEmployment.be  This test is no t yet approved or cleared by the [place] FDA and  has been authorized for detection and/or diagnosis of SARS-CoV-2 by FDA under an Emergency Use Authorization (EUA). This EUA will remain  in effect (meaning this test can be used) for the duration of the COVID-19 declaration under Section 564(b)(1) of the Act, 21 U.S.C.section 360bbb-3(b)(1), unless the authorization is terminated  or revoked sooner.       Influenza A by PCR NEGATIVE NEGATIVE Final   Influenza B by PCR NEGATIVE NEGATIVE Final    Comment: (NOTE) The Xpert Xpress SARS-CoV-2/FLU/RSV plus assay is intended as an aid in the diagnosis of influenza from Nasopharyngeal swab specimens and should not be used as a sole basis for treatment. Nasal washings and aspirates are unacceptable for Xpert Xpress SARS-CoV-2/FLU/RSV testing.  Fact Sheet for Patients: EntrepreneurPulse.com.au  Fact Sheet for Healthcare Providers: IncredibleEmployment.be  This test is not yet approved or cleared by the [place] FDA and has been authorized for detection and/or diagnosis of SARS-CoV-2 by FDA under an Emergency Use Authorization (EUA). This EUA will remain in effect (meaning this test can be used) for the duration of the COVID-19 declaration under Section 564(b)(1) of the Act, 21  U.S.C. section 360bbb-3(b)(1), unless the authorization is terminated or revoked.  Performed at [facility], [address] [place], [place] [address]   Blood culture (routine x 2)     Status: None (Preliminary result)   Collection Time: 09/06/20 11:42 PM   Specimen: BLOOD  Result Value Ref Range Status   Specimen Description BLOOD RIGHT ASSIST CONTROL  Final   Special Requests   Final    BOTTLES DRAWN AEROBIC AND ANAEROBIC Blood Culture adequate volume   Culture   Final    NO GROWTH 2 DAYS Performed at [facility], [address], [place], [place] [address]    Report Status PENDING  Incomplete  Blood culture (routine x 2)     Status: None (Preliminary result)   Collection Time: 09/06/20 11:42 PM   Specimen: BLOOD  Result Value  Ref Range Status   Specimen Description BLOOD LEFT ASSIST CONTROL  Final   Special Requests   Final    BOTTLES DRAWN AEROBIC AND ANAEROBIC Blood Culture results may not be optimal due to an excessive volume of blood received in culture bottles   Culture   Final    NO GROWTH 2 DAYS Performed at [facility], [address], [place], [place] [address]    Report Status PENDING  Incomplete  Body fluid culture     Status: None (Preliminary result)   Collection Time: 09/08/20 10:44 AM   Specimen: PATH Cytology Pleural fluid  Result Value Ref Range Status   Specimen Description   Final    PLEURAL Performed at [facility], [address], [place], [place] [address]    Special Requests   Final    NONE Performed at [facility], [address] [place], [place] [address]    Gram Stain PENDING  Incomplete   Culture   Final    NO GROWTH < 24 HOURS Performed at [facility], [address], [place], [place] [address]    Report Status PENDING  Incomplete     Radiology Studies: DG Chest 1 View  Result Date: 09/08/2020 CLINICAL DATA:  Post biopsy, RIGHT lung pneumothorax EXAM: CHEST  1 VIEW  COMPARISON:  Portable exam 1304 hours compared to 1053 hours FINDINGS: Enlargement of cardiac silhouette post CABG and AVR. Mediastinal contours and pulmonary vascularity normal. Atherosclerotic calcification aorta. Tiny persistent RIGHT apex pneumothorax. Bibasilar pleural effusions and atelectasis. Questionable perihilar infiltrate. Bones demineralized. IMPRESSION: Persistent tiny RIGHT apex pneumothorax. Electronically Signed   By: Lavonia Dana M.D.   On: 09/08/2020 13:11   DG Chest 2 View  Result Date: 09/09/2020 CLINICAL DATA:  Shortness of breath EXAM: CHEST - 2 VIEW COMPARISON:  09/08/2020 FINDINGS: Cardiac shadow is enlarged. Postsurgical changes are again seen and stable. Bilateral pleural effusions are noted relatively stable from the prior exam. No new focal infiltrate is seen. IMPRESSION: Stable bilateral effusions. Electronically Signed   By: Inez Catalina M.D.   On: 09/09/2020 11:38   DG Chest Port 1 View  Result Date: 09/08/2020 CLINICAL DATA:  Status post thoracentesis EXAM: PORTABLE CHEST 1 VIEW COMPARISON:  CT chest dated 09/07/2020 FINDINGS: Small right pleural effusion, decreased status post thoracentesis. Suspected tiny right apical pneumothorax. Small left pleural effusion. Associated left lower lobe opacity, likely atelectasis. Cardiomegaly. Prosthetic valve. Postsurgical changes related to prior CABG. Median sternotomy. IMPRESSION: Suspected tiny right apical pneumothorax. These results were notified at the time of interpretation on 09/08/2020 at 11:15 am to provider DANIEL HASSELL , who acknowledged these results. Small right pleural effusion, decreased status post thoracentesis. Small left pleural effusion. Associated left lower lobe opacity, likely atelectasis. Electronically Signed   By: Julian Hy M.D.   On: 09/08/2020 11:16   US THORACENTESIS ASP PLEURAL SPACE W/IMG GUIDE  Result Date: 09/08/2020 INDICATION: Cough, shortness of breath, pleural effusions right greater  than left EXAM: ULTRASOUND GUIDED RIGHT THORACENTESIS MEDICATIONS: Lidocaine 1% subcutaneous COMPLICATIONS: Possible small postprocedure pneumothorax on portable chest radiograph, asymptomatic. SIR LEVEL B - Normal therapy, includes overnight admission for observation. PROCEDURE: An ultrasound guided thoracentesis was thoroughly discussed with the patient and questions answered. The benefits, risks, alternatives and complications were also discussed. The patient understands and wishes to proceed with the procedure. Written consent was obtained. Ultrasound was performed to localize and mark an adequate pocket of fluid in the right chest. The area was  then prepped and draped in the normal sterile fashion. 1% Lidocaine was used for local anesthesia. Under ultrasound guidance a 6 Fr Safe-T-Centesis catheter was introduced. Thoracentesis was performed. The catheter was removed and a dressing applied. FINDINGS: A total of approximately 1.2 L of clear yellow fluid was removed. Samples were sent to the laboratory as requested by the clinical team. IMPRESSION: Successful ultrasound guided right thoracentesis yielding 1.2 L of pleural fluid. Electronically Signed   By: Lucrezia Europe M.D.   On: 09/08/2020 12:50    Scheduled Meds: . aspirin EC  81 mg Oral BID  . heparin  5,000 Units Subcutaneous Q8H  . pravastatin  10 mg Oral QPM  . sodium chloride flush  3 mL Intravenous Q12H  . torsemide  40 mg Oral Daily   Continuous Infusions: . sodium chloride       LOS: 2 days   Time spent: 35 minutes.  Lorella Nimrod, MD [facility]  If 7PM-7AM, please contact night-coverage [email or web address]  09/09/2020, 1:24 PM   This record has been created using Systems analyst. Errors have been sought and corrected,but may not always be located. Such creation errors do not reflect on the standard of care.

## 2020-09-09 NOTE — Progress Notes (Addendum)
Pulmonary Medicine          Date: 09/09/2020,   MRN# [id number] [name] 09/11/1931     AdmissionWeight: 81.6 kg                 CurrentWeight: 81.6 kg   Referring physician: Dr Reesa Chew   CHIEF COMPLAINT:   Large pleural effusion    HISTORY OF PRESENT ILLNESS    [name] is a 86 y.o. male with medical history significant for chronic systolic CHF, chronic kidney disease stage IV, coronary artery disease, hypertension, and anxiety, presenting to emergency department with worsening shortness of breath. Also complained of bilateral LE edema which is worsening. In ED saturating 80% on room air, tachypneic, and with stable blood pressure. EKG features and undetermined rhythm with PVCs and IVCD. Chest x-ray notable for bilateral pleural effusions and atelectasis versus infiltrate. CT chest demonstrates moderate right and small left pleural effusions with consolidation in the bilateral lower lobes which could reflect atelectasis, infection, or possibly underlying malignancy.  Chemistry panel notable for creatinine 2.19, up from 1.68 last month. CBC is unremarkable. High-sensitivity troponin is 87 then 82. BNP is elevated to 3000.49. COVID-19 PCR is negative. Blood cultures were collected in the ED and 750 mg IV Levaquin. He had thoracentesis done and 1.2L of fluid was removed. He feels better after procedure.    09/09/20- patient with mild cough. Repeat CXR reviwed with R and L residual pleural effusions. Will order repeat thoracentesis and continue MetaNEB therapy.   Patient is close to baseline and states he wishes to go home to be with his family.   Daughter was able to speak with me via phone, she was frustrated ab IR not being available on christmas and also asked me about some cardiac procedures that she feels needs to be done.  I have explained to her numerous times that i'm unable to force anyone to do what she is requesting and am not at liberty to answer why  something was not done or who said what.   PAST MEDICAL HISTORY   Past Medical History:  Diagnosis Date  . Anemia   . Aortic aneurysm ([place])   . Aortic aneurysm ([place])   . Arthritis   . CKD (chronic kidney disease)    ALSO LEFT RENAL MASS  . Coronary artery disease   . Diverticulosis    WITH RUPTURE  . GERD (gastroesophageal reflux disease)   . History of hiatal hernia   . Hypercholesteremia   . Hypertension   . MGUS (monoclonal gammopathy of unknown significance)   . MGUS (monoclonal gammopathy of unknown significance)      SURGICAL HISTORY   Past Surgical History:  Procedure Laterality Date  . AORTIC VALVE REPLACEMENT    . bipass    . CARDIAC VALVE REPLACEMENT     pig valve  . CHOLECYSTECTOMY    . COLON SURGERY    . CORONARY ANGIOPLASTY    . CORONARY ARTERY BYPASS GRAFT    . ESOPHAGOGASTRODUODENOSCOPY (EGD) WITH PROPOFOL N/A 02/07/2015   Procedure: ESOPHAGOGASTRODUODENOSCOPY (EGD) WITH PROPOFOL;  Surgeon: Hulen Luster, MD;  Location: [facility] ENDOSCOPY;  Service: Gastroenterology;  Laterality: N/A;  . EYE SURGERY    . HERNIA REPAIR    . INTRAMEDULLARY (IM) NAIL INTERTROCHANTERIC Right 05/12/2020   Procedure: INTRAMEDULLARY (IM) NAIL INTERTROCHANTRIC;  Surgeon: Lovell Sheehan, MD;  Location: [facility] ORS;  Service: Orthopedics;  Laterality: Right;  . JOINT REPLACEMENT     left  knee x4  . KNEE ARTHROPLASTY Right 04/02/2016   Procedure: COMPUTER ASSISTED TOTAL KNEE ARTHROPLASTY;  Surgeon: Dereck Leep, MD;  Location: [facility] ORS;  Service: Orthopedics;  Laterality: Right;  . ORIF FEMUR FRACTURE Right 08/08/2020   Procedure: OPEN REDUCTION INTERNAL FIXATION (ORIF) DISTAL FEMUR FRACTURE;  Surgeon: Lovell Sheehan, MD;  Location: [facility] ORS;  Service: Orthopedics;  Laterality: Right;     FAMILY HISTORY   Family History  Problem Relation Age of Onset  . Bladder Cancer Neg Hx   . Prostate cancer Neg Hx   . Kidney cancer Neg Hx      SOCIAL HISTORY   Social History   Tobacco  Use  . Smoking status: Former Research scientist (life sciences)  . Smokeless tobacco: Never Used  Substance Use Topics  . Alcohol use: No  . Drug use: No     MEDICATIONS    Home Medication:    Current Medication:  Current Facility-Administered Medications:  .  0.9 %  sodium chloride infusion, 250 mL, Intravenous, PRN, Opyd, Ilene Qua, MD .  acetaminophen (TYLENOL) tablet 650 mg, 650 mg, Oral, Q4H PRN, Opyd, Ilene Qua, MD .  ALPRAZolam Duanne Moron) tablet 0.25 mg, 0.25 mg, Oral, BID PRN, Opyd, Ilene Qua, MD .  aspirin EC tablet 81 mg, 81 mg, Oral, BID, Dorothe Pea, RPH, 81 mg at 09/09/20 0930 .  heparin injection 5,000 Units, 5,000 Units, Subcutaneous, Q8H, Dorothe Pea, RPH, 5,000 Units at 09/09/20 1409 .  melatonin tablet 2.5 mg, 2.5 mg, Oral, QHS PRN, Opyd, Timothy S, MD .  ondansetron (ZOFRAN) injection 4 mg, 4 mg, Intravenous, Q6H PRN, Opyd, Timothy S, MD .  pravastatin (PRAVACHOL) tablet 10 mg, 10 mg, Oral, QPM, Opyd, Ilene Qua, MD, 10 mg at 09/08/20 1711 .  sodium chloride flush (NS) 0.9 % injection 3 mL, 3 mL, Intravenous, Q12H, Opyd, Ilene Qua, MD, 3 mL at 09/09/20 1000 .  sodium chloride flush (NS) 0.9 % injection 3 mL, 3 mL, Intravenous, PRN, Opyd, Ilene Qua, MD    ALLERGIES   Morphine, Oxycodone, Carvedilol, Iodinated diagnostic agents, and Lisinopril     REVIEW OF SYSTEMS    Review of Systems:  Gen:  Denies  fever, sweats, chills weigh loss  HEENT: Denies blurred vision, double vision, ear pain, eye pain, hearing loss, nose bleeds, sore throat Cardiac:  No dizziness, chest pain or heaviness, chest tightness,edema Resp:   + sputum porduction, shortness of breath,wheezing, hemoptysis,  Gi: Denies swallowing difficulty, stomach pain, nausea or vomiting, diarrhea, constipation, bowel incontinence Gu:  Denies bladder incontinence, burning urine Ext:   Denies Joint pain, stiffness or swelling Skin: Denies  skin rash, easy bruising or bleeding or hives Endoc:  Denies polyuria,  polydipsia , polyphagia or weight change Psych:   Denies depression, insomnia or hallucinations   Other:  All other systems negative   VS: BP 109/68 (BP Location: Left Arm)   Pulse 96   Temp 97.7 F (36.5 C)   Resp 17   Ht 5\' 7"  (1.702 m)   Wt 81.6 kg   SpO2 96%   BMI 28.19 kg/m      PHYSICAL EXAM    GENERAL:NAD, no fevers, chills, no weakness no fatigue HEAD: Normocephalic, atraumatic.  EYES: Pupils equal, round, reactive to light. Extraocular muscles intact. No scleral icterus.  MOUTH: Moist mucosal membrane. Dentition intact. No abscess noted.  EAR, NOSE, THROAT: Clear without exudates. No external lesions.  NECK: Supple. No thyromegaly. No nodules. No JVD.  PULMONARY: rhonchi bilaterally  CARDIOVASCULAR: S1 and S2. Regular rate and rhythm. No murmurs, rubs, or gallops. No edema. Pedal pulses 2+ bilaterally.  GASTROINTESTINAL: Soft, nontender, nondistended. No masses. Positive bowel sounds. No hepatosplenomegaly.  MUSCULOSKELETAL: No swelling, clubbing, or edema. Range of motion full in all extremities.  NEUROLOGIC: Cranial nerves II through XII are intact. No gross focal neurological deficits. Sensation intact. Reflexes intact.  SKIN: No ulceration, lesions, rashes, or cyanosis. Skin warm and dry. Turgor intact.  PSYCHIATRIC: Mood, affect within normal limits. The patient is awake, alert and oriented x 3. Insight, judgment intact.       IMAGING    DG Chest 1 View  Result Date: 09/08/2020 CLINICAL DATA:  Post biopsy, RIGHT lung pneumothorax EXAM: CHEST  1 VIEW COMPARISON:  Portable exam 1304 hours compared to 1053 hours FINDINGS: Enlargement of cardiac silhouette post CABG and AVR. Mediastinal contours and pulmonary vascularity normal. Atherosclerotic calcification aorta. Tiny persistent RIGHT apex pneumothorax. Bibasilar pleural effusions and atelectasis. Questionable perihilar infiltrate. Bones demineralized. IMPRESSION: Persistent tiny RIGHT apex pneumothorax.  Electronically Signed   By: Lavonia Dana M.D.   On: 09/08/2020 13:11   DG Chest 2 View  Result Date: 09/09/2020 CLINICAL DATA:  Shortness of breath EXAM: CHEST - 2 VIEW COMPARISON:  09/08/2020 FINDINGS: Cardiac shadow is enlarged. Postsurgical changes are again seen and stable. Bilateral pleural effusions are noted relatively stable from the prior exam. No new focal infiltrate is seen. IMPRESSION: Stable bilateral effusions. Electronically Signed   By: Inez Catalina M.D.   On: 09/09/2020 11:38   CT Chest Wo Contrast  Result Date: 09/07/2020 CLINICAL DATA:  Cough and shortness of breath. EXAM: CT CHEST WITHOUT CONTRAST TECHNIQUE: Multidetector CT imaging of the chest was performed following the standard protocol without IV contrast. COMPARISON:  Jan 27, 2018 FINDINGS: Cardiovascular: There is marked severity calcification of the aortic arch and descending thoracic aorta. Calcification of the ascending aorta is also noted. There is mild cardiomegaly with marked severity coronary artery calcification. An artificial aortic valve is seen. No pericardial effusion. Mediastinum/Nodes: Multiple sternal wires are present. Multiple subcentimeter calcified and noncalcified pretracheal and AP window lymph nodes are seen. The thyroid gland, trachea and esophagus demonstrate no significant abnormalities. Lungs/Pleura: Moderate to marked severity areas of consolidation seen within the bilateral lower lobes. These areas contain large clusters of tiny hyperdense foci. A 7 mm calcified granuloma is seen within the anterior aspect of the right middle lobe. There is a moderate-sized right pleural effusion. A small left pleural effusion is also seen. No pneumothorax is identified. Upper Abdomen: Right upper quadrant surgical clips are seen. Bilateral renal cysts are noted. Musculoskeletal: A compression fracture deformity of indeterminate age is seen at the level of T10. This represents a new finding when compared to the prior  study. Multiple chronic left-sided rib fractures are also seen. Degenerative changes are noted throughout the thoracic spine. IMPRESSION: 1. Moderate to marked severity areas of bilateral lower lobe consolidation likely secondary to areas of atelectasis and/or infiltrate. Follow-up to resolution is recommended to exclude the presence of an underlying neoplastic process. 2. Moderate-sized right and small left pleural effusions. 3. Mild cardiomegaly with marked severity coronary artery calcification. 4. Evidence of prior median sternotomy and aortic valve replacement. 5. Bilateral renal cysts. 6. Compression fracture deformity of indeterminate age at the level of T10. This represents a new finding when compared to the prior study. MRI correlation is recommended. 7. Multiple chronic left-sided rib fractures. 8. Aortic atherosclerosis. Aortic Atherosclerosis (ICD10-I70.0). Electronically Signed  By: Virgina Norfolk M.D.   On: 09/07/2020 01:47   DG Chest Port 1 View  Result Date: 09/08/2020 CLINICAL DATA:  Status post thoracentesis EXAM: PORTABLE CHEST 1 VIEW COMPARISON:  CT chest dated 09/07/2020 FINDINGS: Small right pleural effusion, decreased status post thoracentesis. Suspected tiny right apical pneumothorax. Small left pleural effusion. Associated left lower lobe opacity, likely atelectasis. Cardiomegaly. Prosthetic valve. Postsurgical changes related to prior CABG. Median sternotomy. IMPRESSION: Suspected tiny right apical pneumothorax. These results were notified at the time of interpretation on 09/08/2020 at 11:15 am to provider DANIEL HASSELL , who acknowledged these results. Small right pleural effusion, decreased status post thoracentesis. Small left pleural effusion. Associated left lower lobe opacity, likely atelectasis. Electronically Signed   By: Julian Hy M.D.   On: 09/08/2020 11:16   DG Chest Portable 1 View  Result Date: 09/07/2020 CLINICAL DATA:  Shortness of breath. EXAM:  PORTABLE CHEST 1 VIEW COMPARISON:  August 07, 2020 FINDINGS: Multiple sternal wires are seen. Diffusely prominent interstitial lung markings are noted with mild to moderate severity areas of atelectasis and/or infiltrate within the bilateral lung bases. A stable moderate size right pleural effusion is seen with a small, stable left pleural effusion. No pneumothorax is identified. There is mild to moderate severity enlargement of the cardiac silhouette. An artificial aortic valve is also noted. There is moderate severity calcification of the aortic arch. Degenerative changes seen throughout the thoracic spine. IMPRESSION: 1. Evidence of prior median sternotomy/CABG. 2. Stable bilateral pleural effusions with mild to moderate severity bibasilar atelectasis and/or infiltrate. Electronically Signed   By: Virgina Norfolk M.D.   On: 09/07/2020 00:22   US THORACENTESIS ASP PLEURAL SPACE W/IMG GUIDE  Result Date: 09/08/2020 INDICATION: Cough, shortness of breath, pleural effusions right greater than left EXAM: ULTRASOUND GUIDED RIGHT THORACENTESIS MEDICATIONS: Lidocaine 1% subcutaneous COMPLICATIONS: Possible small postprocedure pneumothorax on portable chest radiograph, asymptomatic. SIR LEVEL B - Normal therapy, includes overnight admission for observation. PROCEDURE: An ultrasound guided thoracentesis was thoroughly discussed with the patient and questions answered. The benefits, risks, alternatives and complications were also discussed. The patient understands and wishes to proceed with the procedure. Written consent was obtained. Ultrasound was performed to localize and mark an adequate pocket of fluid in the right chest. The area was then prepped and draped in the normal sterile fashion. 1% Lidocaine was used for local anesthesia. Under ultrasound guidance a 6 Fr Safe-T-Centesis catheter was introduced. Thoracentesis was performed. The catheter was removed and a dressing applied. FINDINGS: A total of  approximately 1.2 L of clear yellow fluid was removed. Samples were sent to the laboratory as requested by the clinical team. IMPRESSION: Successful ultrasound guided right thoracentesis yielding 1.2 L of pleural fluid. Electronically Signed   By: Lucrezia Europe M.D.   On: 09/08/2020 12:50   INTERPRETATION  MILD SEGMENTAL LV SYSTOLIC DYSFUNCTION WITH AN ESTIMATED EF = 40-45 %  MILD RV SYSTOLIC DYSFUNCTION (See above)  MODERATE TRICUSPID VALVE INSUFFICIENCY  MILD-TO-MODERATE MITRAL VALVE INSUFFICIENCY  TRACE AORTIC VALVE INSUFFICIENCY  NO VALVULAR STENOSIS  MODERATE RV ENLARGEMENT  MODERATE RA ENLARGEMENT  MILD LV ENLARGEMENT  MILD LA ENLARGEMENT  MILDLY DILATED AORTIC ROOT MEASURING UP TO 4.0 cm  NORMAL BIOPROSTHETIC AORTIC VALVE FUNCTION  _________________________________________________________________________________________  Electronically signed by   MD Serafina Royals on 04/19/2020 09: 28 AM      Performed By: Cephus Shelling, RVT   Ordering Physician: Nehemiah Massed MD Bruce     ASSESSMENT/PLAN   Right pleural effusion    -  Lymphocyte predominant transudate with normal glucose consistent with simple non-parapneumonic effusion.  -additional studies are pending  - likey due to renal impairment and systolic CHF -TTE 01/157 - HFrEF -  Ischemic cardiomyopathy s/p CABG and multi valvular pathology  -patient with MGUS - query regarding Amyloidosis      Thank you for allowing me to participate in the care of this patient.   Patient/Family are satisfied with care plan and all questions have been answered.  This document was prepared using Dragon voice recognition software and may include unintentional dictation errors.     Ottie Glazier, M.D.  Division of [place]

## 2020-09-09 NOTE — Consult Note (Signed)
[facility] Consultation Note  Patient ID: [name], MRN: [id number], DOB/AGE: [birth year] 86 y.o. Admit date: 09/06/2020   Date of Consult: 09/09/2020 Primary Physician: Baxter Hire, MD Primary Cardiologist: Nehemiah Massed  Chief Complaint:  Chief Complaint  Patient presents with  . Shortness of Breath   Reason for Consult: Cardiovascular disease with heart failure  HPI: 86 y.o. male with known coronary artery disease status post coronary to bypass graft and aortic valve replacement due to severe aortic valve stenosis in the remote past having recurrent episodes of pleural effusions pulmonary edema multifactorial in nature.  The patient has had a recent admission to the hospital last month with acute systolic dysfunction congestive heart failure.  Echocardiogram at that shown time showed mild to moderate LV systolic dysfunction with ejection fraction of 40% with a normally functioning aortic valve prosthesis and moderate mitral regurgitation.  The patient was placed on appropriate medication management and had reasonable diuresis but continued having trouble with his diuresis.  We have an outpatient evaluation for which the patient was counseled on low-sodium diet and chronic kidney disease has been relatively stable with her chronic kidney disease glomerular filtration rate of 28.  There was some discussion of transitioning to other diuretics although the patient was relatively well the last visit.  The patient then had worsening symptoms of lower extremity edema weight gain and congestive heart failure PND and orthopnea and was seen in the emergency room.  At that time he had other significant right pleural effusion and pulmonary edema for which he was transitioned to torsemide which has helped a great deal.  Additionally the patient has had a right thoracentesis for which she now feels significantly improved with his breathing.  EKG has shown normal sinus rhythm with left  interventricular conduction defect and very frequent preventricular contractions.  Troponin was 87 consistent with demand ischemia and BNP was 3448.  The patient slowly is improving with less need for oxygenation and continued improvements with current medical regimen.  Past Medical History:  Diagnosis Date  . Anemia   . Aortic aneurysm ([place])   . Aortic aneurysm ([place])   . Arthritis   . CKD (chronic kidney disease)    ALSO LEFT RENAL MASS  . Coronary artery disease   . Diverticulosis    WITH RUPTURE  . GERD (gastroesophageal reflux disease)   . History of hiatal hernia   . Hypercholesteremia   . Hypertension   . MGUS (monoclonal gammopathy of unknown significance)   . MGUS (monoclonal gammopathy of unknown significance)       Surgical History:  Past Surgical History:  Procedure Laterality Date  . AORTIC VALVE REPLACEMENT    . bipass    . CARDIAC VALVE REPLACEMENT     pig valve  . CHOLECYSTECTOMY    . COLON SURGERY    . CORONARY ANGIOPLASTY    . CORONARY ARTERY BYPASS GRAFT    . ESOPHAGOGASTRODUODENOSCOPY (EGD) WITH PROPOFOL N/A 02/07/2015   Procedure: ESOPHAGOGASTRODUODENOSCOPY (EGD) WITH PROPOFOL;  Surgeon: Hulen Luster, MD;  Location: [facility] ENDOSCOPY;  Service: Gastroenterology;  Laterality: N/A;  . EYE SURGERY    . HERNIA REPAIR    . INTRAMEDULLARY (IM) NAIL INTERTROCHANTERIC Right 05/12/2020   Procedure: INTRAMEDULLARY (IM) NAIL INTERTROCHANTRIC;  Surgeon: Lovell Sheehan, MD;  Location: [facility] ORS;  Service: Orthopedics;  Laterality: Right;  . JOINT REPLACEMENT     left knee x4  . KNEE ARTHROPLASTY Right 04/02/2016   Procedure: COMPUTER ASSISTED TOTAL KNEE ARTHROPLASTY;  Surgeon: Dereck Leep, MD;  Location: [facility] ORS;  Service: Orthopedics;  Laterality: Right;  . ORIF FEMUR FRACTURE Right 08/08/2020   Procedure: OPEN REDUCTION INTERNAL FIXATION (ORIF) DISTAL FEMUR FRACTURE;  Surgeon: Lovell Sheehan, MD;  Location: [facility] ORS;  Service: Orthopedics;  Laterality: Right;      Home Meds: Prior to Admission medications   Medication Sig Start Date End Date Taking? Authorizing Provider  ALPRAZolam (XANAX) 0.25 MG tablet Take 0.25 mg by mouth 2 (two) times daily as needed. 08/26/20  Yes [provider]  amLODipine (NORVASC) 5 MG tablet Take 5 mg by mouth daily.   Yes [provider]  amoxicillin-clavulanate (AUGMENTIN) 500-125 MG tablet Take 1 tablet by mouth 2 (two) times daily. 09/02/20  Yes [provider]  aspirin EC 81 MG tablet Take 1 tablet (81 mg total) by mouth 2 (two) times daily. Swallow whole. 08/10/20  Yes Donne Hazel, MD  furosemide (LASIX) 40 MG tablet Take 1 tablet (40 mg total) by mouth daily. 08/11/20 09/10/20 Yes Donne Hazel, MD  irbesartan (AVAPRO) 150 MG tablet Take 1 tablet by mouth 2 (two) times daily. 06/08/19  Yes [provider]  Melatonin 3 MG TABS Take 1.5 mg by mouth at bedtime as needed (sleep).   Yes [provider]  pravastatin (PRAVACHOL) 20 MG tablet Take 20 mg by mouth every evening.   Yes [provider]  sodium bicarbonate 650 MG tablet Take 1,300 mg by mouth 2 (two) times daily.   Yes [provider]  traMADol (ULTRAM) 50 MG tablet Take 50 mg by mouth every 4 (four) hours as needed. 08/16/20  Yes [provider]    Inpatient Medications:  . aspirin EC  81 mg Oral BID  . heparin  5,000 Units Subcutaneous Q8H  . pravastatin  10 mg Oral QPM  . sodium chloride flush  3 mL Intravenous Q12H  . torsemide  40 mg Oral Daily   . sodium chloride      Allergies:  Allergies  Allergen Reactions  . Morphine Anaphylaxis  . Oxycodone Other (See Comments)    Hypotension and bradycardia  . Carvedilol     Dizziness and syncope   . Iodinated Diagnostic Agents Rash    Other reaction(s): Asthenia (finding), Other (qualifier value) paralyzed legs Other reaction(s): RASH   . Lisinopril Cough and Other (See Comments)    Social History   Socioeconomic  History  . Marital status: Widowed    Spouse name: Not on file  . Number of children: Not on file  . Years of education: Not on file  . Highest education level: Not on file  Occupational History  . Not on file  Tobacco Use  . Smoking status: Former Research scientist (life sciences)  . Smokeless tobacco: Never Used  Substance and Sexual Activity  . Alcohol use: No  . Drug use: No  . Sexual activity: Not on file  Other Topics Concern  . Not on file  Social History Narrative  . Not on file   Social Determinants of Health   Financial Resource Strain: Not on file  Food Insecurity: Not on file  Transportation Needs: Not on file  Physical Activity: Not on file  Stress: Not on file  Social Connections: Not on file  Intimate Partner Violence: Not on file     Family History  Problem Relation Age of Onset  . Bladder Cancer Neg Hx   . Prostate cancer Neg Hx   . Kidney cancer Neg Hx  Review of Systems Positive for weight loss shortness of breath Negative for: General:  chills, fever, night sweats and positive for weight changes.  Cardiovascular: PND orthopnea syncope dizziness  Dermatological skin lesions rashes Respiratory: Cough congestion Urologic: Frequent urination urination at night and hematuria Abdominal: negative for nausea, vomiting, diarrhea, bright red blood per rectum, melena, or hematemesis Neurologic: negative for visual changes, and/or hearing changes  All other systems reviewed and are otherwise negative except as noted above.  Labs: No results for input(s): CKTOTAL, CKMB, TROPONINI in the last 72 hours. Lab Results  Component Value Date   WBC 7.7 09/06/2020   HGB 13.3 09/06/2020   HCT 37.9 (L) 09/06/2020   MCV 90.5 09/06/2020   PLT 225 09/06/2020    Recent Labs  Lab 09/06/20 2342 09/08/20 0517 09/09/20 0559  NA 142   < > 141  K 3.5   < > 3.5  CL 105   < > 106  CO2 22   < > 24  BUN 51*   < > 42*  CREATININE 2.19*   < > 2.24*  CALCIUM 8.9   < > 8.9  PROT 7.2  --    --   BILITOT 1.1  --   --   ALKPHOS 138*  --   --   ALT 32  --   --   AST 35  --   --   GLUCOSE 121*   < > 96   < > = values in this interval not displayed.   No results found for: CHOL, HDL, LDLCALC, TRIG No results found for: DDIMER  Radiology/Studies:  DG Chest 1 View  Result Date: 09/08/2020 CLINICAL DATA:  Post biopsy, RIGHT lung pneumothorax EXAM: CHEST  1 VIEW COMPARISON:  Portable exam 1304 hours compared to 1053 hours FINDINGS: Enlargement of cardiac silhouette post CABG and AVR. Mediastinal contours and pulmonary vascularity normal. Atherosclerotic calcification aorta. Tiny persistent RIGHT apex pneumothorax. Bibasilar pleural effusions and atelectasis. Questionable perihilar infiltrate. Bones demineralized. IMPRESSION: Persistent tiny RIGHT apex pneumothorax. Electronically Signed   By: Lavonia Dana M.D.   On: 09/08/2020 13:11   CT Chest Wo Contrast  Result Date: 09/07/2020 CLINICAL DATA:  Cough and shortness of breath. EXAM: CT CHEST WITHOUT CONTRAST TECHNIQUE: Multidetector CT imaging of the chest was performed following the standard protocol without IV contrast. COMPARISON:  Jan 27, 2018 FINDINGS: Cardiovascular: There is marked severity calcification of the aortic arch and descending thoracic aorta. Calcification of the ascending aorta is also noted. There is mild cardiomegaly with marked severity coronary artery calcification. An artificial aortic valve is seen. No pericardial effusion. Mediastinum/Nodes: Multiple sternal wires are present. Multiple subcentimeter calcified and noncalcified pretracheal and AP window lymph nodes are seen. The thyroid gland, trachea and esophagus demonstrate no significant abnormalities. Lungs/Pleura: Moderate to marked severity areas of consolidation seen within the bilateral lower lobes. These areas contain large clusters of tiny hyperdense foci. A 7 mm calcified granuloma is seen within the anterior aspect of the right middle lobe. There is a  moderate-sized right pleural effusion. A small left pleural effusion is also seen. No pneumothorax is identified. Upper Abdomen: Right upper quadrant surgical clips are seen. Bilateral renal cysts are noted. Musculoskeletal: A compression fracture deformity of indeterminate age is seen at the level of T10. This represents a new finding when compared to the prior study. Multiple chronic left-sided rib fractures are also seen. Degenerative changes are noted throughout the thoracic spine. IMPRESSION: 1. Moderate to marked severity areas of  bilateral lower lobe consolidation likely secondary to areas of atelectasis and/or infiltrate. Follow-up to resolution is recommended to exclude the presence of an underlying neoplastic process. 2. Moderate-sized right and small left pleural effusions. 3. Mild cardiomegaly with marked severity coronary artery calcification. 4. Evidence of prior median sternotomy and aortic valve replacement. 5. Bilateral renal cysts. 6. Compression fracture deformity of indeterminate age at the level of T10. This represents a new finding when compared to the prior study. MRI correlation is recommended. 7. Multiple chronic left-sided rib fractures. 8. Aortic atherosclerosis. Aortic Atherosclerosis (ICD10-I70.0). Electronically Signed   By: Virgina Norfolk M.D.   On: 09/07/2020 01:47   DG Chest Port 1 View  Result Date: 09/08/2020 CLINICAL DATA:  Status post thoracentesis EXAM: PORTABLE CHEST 1 VIEW COMPARISON:  CT chest dated 09/07/2020 FINDINGS: Small right pleural effusion, decreased status post thoracentesis. Suspected tiny right apical pneumothorax. Small left pleural effusion. Associated left lower lobe opacity, likely atelectasis. Cardiomegaly. Prosthetic valve. Postsurgical changes related to prior CABG. Median sternotomy. IMPRESSION: Suspected tiny right apical pneumothorax. These results were notified at the time of interpretation on 09/08/2020 at 11:15 am to provider DANIEL HASSELL ,  who acknowledged these results. Small right pleural effusion, decreased status post thoracentesis. Small left pleural effusion. Associated left lower lobe opacity, likely atelectasis. Electronically Signed   By: Julian Hy M.D.   On: 09/08/2020 11:16   DG Chest Portable 1 View  Result Date: 09/07/2020 CLINICAL DATA:  Shortness of breath. EXAM: PORTABLE CHEST 1 VIEW COMPARISON:  August 07, 2020 FINDINGS: Multiple sternal wires are seen. Diffusely prominent interstitial lung markings are noted with mild to moderate severity areas of atelectasis and/or infiltrate within the bilateral lung bases. A stable moderate size right pleural effusion is seen with a small, stable left pleural effusion. No pneumothorax is identified. There is mild to moderate severity enlargement of the cardiac silhouette. An artificial aortic valve is also noted. There is moderate severity calcification of the aortic arch. Degenerative changes seen throughout the thoracic spine. IMPRESSION: 1. Evidence of prior median sternotomy/CABG. 2. Stable bilateral pleural effusions with mild to moderate severity bibasilar atelectasis and/or infiltrate. Electronically Signed   By: Virgina Norfolk M.D.   On: 09/07/2020 00:22   US THORACENTESIS ASP PLEURAL SPACE W/IMG GUIDE  Result Date: 09/08/2020 INDICATION: Cough, shortness of breath, pleural effusions right greater than left EXAM: ULTRASOUND GUIDED RIGHT THORACENTESIS MEDICATIONS: Lidocaine 1% subcutaneous COMPLICATIONS: Possible small postprocedure pneumothorax on portable chest radiograph, asymptomatic. SIR LEVEL B - Normal therapy, includes overnight admission for observation. PROCEDURE: An ultrasound guided thoracentesis was thoroughly discussed with the patient and questions answered. The benefits, risks, alternatives and complications were also discussed. The patient understands and wishes to proceed with the procedure. Written consent was obtained. Ultrasound was performed to  localize and mark an adequate pocket of fluid in the right chest. The area was then prepped and draped in the normal sterile fashion. 1% Lidocaine was used for local anesthesia. Under ultrasound guidance a 6 Fr Safe-T-Centesis catheter was introduced. Thoracentesis was performed. The catheter was removed and a dressing applied. FINDINGS: A total of approximately 1.2 L of clear yellow fluid was removed. Samples were sent to the laboratory as requested by the clinical team. IMPRESSION: Successful ultrasound guided right thoracentesis yielding 1.2 L of pleural fluid. Electronically Signed   By: Lucrezia Europe M.D.   On: 09/08/2020 12:50    EKG: Normal sinus rhythm with left interventricular conduction defect and very frequent preventricular contractions  Weights: Danley Danker  Weights   09/06/20 2334  Weight: 81.6 kg     Physical Exam: Blood pressure 113/83, pulse 81, temperature 97.8 F (36.6 C), temperature source Oral, resp. rate 17, height 5\' 7"  (1.702 m), weight 81.6 kg, SpO2 99 %. Body mass index is 28.19 kg/m. General: Well developed, well nourished, in no acute distress. Head eyes ears nose throat: Normocephalic, atraumatic, sclera non-icteric, no xanthomas, nares are without discharge. No apparent thyromegaly and/or mass  Lungs: Normal respiratory effort.  no wheezes, no rales, no rhonchi.  Heart: Irregular with normal S1 S2.  2+ to 3+ right upper sternal border murmur murmur gallop, no rub, PMI is normal size and placement, carotid upstroke normal without bruit, jugular venous pressure is normal Abdomen: Soft, non-tender, non-distended with normoactive bowel sounds. No hepatomegaly. No rebound/guarding. No obvious abdominal masses. Abdominal aorta is normal size without bruit Extremities: Trace edema. no cyanosis, no clubbing, no ulcers  Peripheral : 2+ bilateral upper extremity pulses, 2+ bilateral femoral pulses, 2+ bilateral dorsal pedal pulse Neuro: Alert and oriented. No facial asymmetry. No  focal deficit. Moves all extremities spontaneously. Musculoskeletal: Normal muscle tone without kyphosis Psych:  Responds to questions appropriately with a normal affect.    Assessment: 86 year old male with known coronary disease status post coronary bypass graft aortic valve disease status post aortic valve replacement having recurrent acute on chronic systolic dysfunction congestive heart failure multifactorial in nature with pleural effusion and no current evidence of acute coronary syndrome or myocardial infarction now improving with transitions to torsemide as well as a right thoracentesis  Plan: 1.  Continue torsemide at current dose which appears to be slowly improving his lower extremity edema pulmonary edema and stabilizing his pleural effusion 2.  No additional medication management for blood pressure due to reasonable blood pressure at this time 3.  Will continue to have close outpatient follow-up and discussions about heart failure and treatment options as well as low-sodium diet 4.  Begin ambulation following for improvements of symptoms and potential for discharged home.  Patient appears to be close at this time 5.  No additional diagnostic testing at this time  Signed, Corey Skains M.D. [facility] 09/09/2020, 8:39 AM

## 2020-09-09 NOTE — Care Management Important Message (Signed)
Important Message  Patient Details  Name: SAJAD GLANDER MRN: [id number] Date of Birth: 24-Nov-1930   Medicare Important Message Given:  Yes     Juliann Pulse A Awais Cobarrubias 09/09/2020, 11:59 AM

## 2020-09-09 NOTE — Progress Notes (Signed)
SATURATION QUALIFICATIONS: (This note is used to comply with regulatory documentation for home oxygen)  Patient Saturations on Room Air at Rest = 96%  Patient Saturations on Room Air while Ambulating = 92%  Patient Saturations on 0 Liters of oxygen while Ambulating = 92-96%  Please briefly explain why patient needs home oxygen: Does not require home oxygen.

## 2020-09-09 NOTE — Progress Notes (Signed)
[place], [place] 09/09/20  Subjective:   Hospital day # 2 Patient presented to the emergency room for progressively worsening shortness of breath.  In the ER, he was hypoxic with minimal exertion therefore was placed on oxygen supplementation.  COVID PCR is negative. CT chest shows moderate to severe bilateral lower lobe consolidation secondary to infiltrate versus atelectasis.  Moderate-sized right pleural effusion.  Mild cardiomegaly.  Severe coronary calcifications. Patient also has lower extremity edema  Baseline creatinine of 1.68 from November 24 Presenting creatinine of 2.2 Nephrology consult requested for evaluation of AKI and volume overload  Patient underwent thoracentesis on December 23.  1.2 L yellow fluid was removed.   Still requiring oxygen supplementation.  Does not require oxygen at home   Renal: 12/23 0701 - 12/24 0700 In: 723 [P.O.:720; I.V.:3] Out: 2350 [Urine:2350] Lab Results  Component Value Date   CREATININE 2.24 (H) 09/09/2020   CREATININE 1.94 (H) 09/08/2020   CREATININE 2.19 (H) 09/06/2020     Objective:  Vital signs in last 24 hours:  Temp:  [97.6 F (36.4 C)-98.3 F (36.8 C)] 97.7 F (36.5 C) (12/24 1130) Pulse Rate:  [70-99] 76 (12/24 1219) Resp:  [17-19] 17 (12/24 0555) BP: (88-113)/(62-83) 90/64 (12/24 1219) SpO2:  [94 %-99 %] 96 % (12/24 1219)  Weight change:  Filed Weights   09/06/20 2334  Weight: 81.6 kg    Intake/Output:    Intake/Output Summary (Last 24 hours) at 09/09/2020 1358 Last data filed at 09/09/2020 1052 Gross per 24 hour  Intake 600 ml  Output 2050 ml  Net -1450 ml    Physical Exam: General:  No acute distress, laying in the bed  HEENT  anicteric, moist oral mucous membrane  Pulm/lungs  normal breathing effort, left lower lobe crackles, bilateral rhonchi, [place] O2  CVS/Heart  regular rhythm, no rub or gallop  Abdomen:   Soft, nontender  Extremities:  + Dependent peripheral edema   Neurologic:  Alert, oriented, able to follow commands  Skin:  No acute rashes      Basic Metabolic Panel:  Recent Labs  Lab 09/06/20 2342 09/07/20 0452 09/08/20 0517 09/09/20 0559  NA 142  --  138 141  K 3.5  --  3.5 3.5  CL 105  --  104 106  CO2 22  --  23 24  GLUCOSE 121*  --  103* 96  BUN 51*  --  42* 42*  CREATININE 2.19*  --  1.94* 2.24*  CALCIUM 8.9  --  8.9 8.9  MG  --  2.4  --   --      CBC: Recent Labs  Lab 09/06/20 2342  WBC 7.7  NEUTROABS 4.5  HGB 13.3  HCT 37.9*  MCV 90.5  PLT 225     No results found for: HEPBSAG, HEPBSAB, HEPBIGM    Microbiology:  Recent Results (from the past 240 hour(s))  Resp Panel by RT-PCR (Flu A&B, Covid) Nasopharyngeal Swab     Status: None   Collection Time: 09/06/20 11:42 PM   Specimen: Nasopharyngeal Swab; Nasopharyngeal(NP) swabs in vial transport medium  Result Value Ref Range Status   SARS Coronavirus 2 by RT PCR NEGATIVE NEGATIVE Final    Comment: (NOTE) SARS-CoV-2 target nucleic acids are NOT DETECTED.  The SARS-CoV-2 RNA is generally detectable in upper respiratory specimens during the acute phase of infection. The lowest concentration of SARS-CoV-2 viral copies this assay can detect is 138 copies/mL. A negative result does not preclude SARS-Cov-2 infection  and should not be used as the sole basis for treatment or other patient management decisions. A negative result may occur with  improper specimen collection/handling, submission of specimen other than nasopharyngeal swab, presence of viral mutation(s) within the areas targeted by this assay, and inadequate number of viral copies(<138 copies/mL). A negative result must be combined with clinical observations, patient history, and epidemiological information. The expected result is Negative.  Fact Sheet for Patients:  EntrepreneurPulse.com.au  Fact Sheet for Healthcare Providers:  IncredibleEmployment.be  This  test is no t yet approved or cleared by the [place] FDA and  has been authorized for detection and/or diagnosis of SARS-CoV-2 by FDA under an Emergency Use Authorization (EUA). This EUA will remain  in effect (meaning this test can be used) for the duration of the COVID-19 declaration under Section 564(b)(1) of the Act, 21 U.S.C.section 360bbb-3(b)(1), unless the authorization is terminated  or revoked sooner.       Influenza A by PCR NEGATIVE NEGATIVE Final   Influenza B by PCR NEGATIVE NEGATIVE Final    Comment: (NOTE) The Xpert Xpress SARS-CoV-2/FLU/RSV plus assay is intended as an aid in the diagnosis of influenza from Nasopharyngeal swab specimens and should not be used as a sole basis for treatment. Nasal washings and aspirates are unacceptable for Xpert Xpress SARS-CoV-2/FLU/RSV testing.  Fact Sheet for Patients: EntrepreneurPulse.com.au  Fact Sheet for Healthcare Providers: IncredibleEmployment.be  This test is not yet approved or cleared by the [place] FDA and has been authorized for detection and/or diagnosis of SARS-CoV-2 by FDA under an Emergency Use Authorization (EUA). This EUA will remain in effect (meaning this test can be used) for the duration of the COVID-19 declaration under Section 564(b)(1) of the Act, 21 U.S.C. section 360bbb-3(b)(1), unless the authorization is terminated or revoked.  Performed at [facility], [address] [place], [place] [address]   Blood culture (routine x 2)     Status: None (Preliminary result)   Collection Time: 09/06/20 11:42 PM   Specimen: BLOOD  Result Value Ref Range Status   Specimen Description BLOOD RIGHT ASSIST CONTROL  Final   Special Requests   Final    BOTTLES DRAWN AEROBIC AND ANAEROBIC Blood Culture adequate volume   Culture   Final    NO GROWTH 2 DAYS Performed at [facility], [address] [place], [place] [address]    Report Status  PENDING  Incomplete  Blood culture (routine x 2)     Status: None (Preliminary result)   Collection Time: 09/06/20 11:42 PM   Specimen: BLOOD  Result Value Ref Range Status   Specimen Description BLOOD LEFT ASSIST CONTROL  Final   Special Requests   Final    BOTTLES DRAWN AEROBIC AND ANAEROBIC Blood Culture results may not be optimal due to an excessive volume of blood received in culture bottles   Culture   Final    NO GROWTH 2 DAYS Performed at [facility], [address], [place], [place] [address]    Report Status PENDING  Incomplete  Body fluid culture     Status: None (Preliminary result)   Collection Time: 09/08/20 10:44 AM   Specimen: PATH Cytology Pleural fluid  Result Value Ref Range Status   Specimen Description   Final    PLEURAL Performed at [facility], [address] [place], [place] [address]    Special Requests   Final    NONE Performed at [facility], [address] [place],  [place] [address]    Gram Stain   Final    RARE WBC PRESENT,BOTH PMN AND MONONUCLEAR NO ORGANISMS SEEN    Culture   Final    NO GROWTH < 24 HOURS Performed at [facility], [place] [address], [place], [place] [address]    Report Status PENDING  Incomplete    Coagulation Studies: No results for input(s): LABPROT, INR in the last 72 hours.  Urinalysis: No results for input(s): COLORURINE, LABSPEC, PHURINE, GLUCOSEU, HGBUR, BILIRUBINUR, KETONESUR, PROTEINUR, UROBILINOGEN, NITRITE, LEUKOCYTESUR in the last 72 hours.  Invalid input(s): APPERANCEUR    Imaging: DG Chest 1 View  Result Date: 09/08/2020 CLINICAL DATA:  Post biopsy, RIGHT lung pneumothorax EXAM: CHEST  1 VIEW COMPARISON:  Portable exam 1304 hours compared to 1053 hours FINDINGS: Enlargement of cardiac silhouette post CABG and AVR. Mediastinal contours and pulmonary vascularity normal. Atherosclerotic calcification aorta. Tiny persistent RIGHT apex pneumothorax. Bibasilar pleural  effusions and atelectasis. Questionable perihilar infiltrate. Bones demineralized. IMPRESSION: Persistent tiny RIGHT apex pneumothorax. Electronically Signed   By: Lavonia Dana M.D.   On: 09/08/2020 13:11   DG Chest 2 View  Result Date: 09/09/2020 CLINICAL DATA:  Shortness of breath EXAM: CHEST - 2 VIEW COMPARISON:  09/08/2020 FINDINGS: Cardiac shadow is enlarged. Postsurgical changes are again seen and stable. Bilateral pleural effusions are noted relatively stable from the prior exam. No new focal infiltrate is seen. IMPRESSION: Stable bilateral effusions. Electronically Signed   By: Inez Catalina M.D.   On: 09/09/2020 11:38   DG Chest Port 1 View  Result Date: 09/08/2020 CLINICAL DATA:  Status post thoracentesis EXAM: PORTABLE CHEST 1 VIEW COMPARISON:  CT chest dated 09/07/2020 FINDINGS: Small right pleural effusion, decreased status post thoracentesis. Suspected tiny right apical pneumothorax. Small left pleural effusion. Associated left lower lobe opacity, likely atelectasis. Cardiomegaly. Prosthetic valve. Postsurgical changes related to prior CABG. Median sternotomy. IMPRESSION: Suspected tiny right apical pneumothorax. These results were notified at the time of interpretation on 09/08/2020 at 11:15 am to provider DANIEL HASSELL , who acknowledged these results. Small right pleural effusion, decreased status post thoracentesis. Small left pleural effusion. Associated left lower lobe opacity, likely atelectasis. Electronically Signed   By: Julian Hy M.D.   On: 09/08/2020 11:16   US THORACENTESIS ASP PLEURAL SPACE W/IMG GUIDE  Result Date: 09/08/2020 INDICATION: Cough, shortness of breath, pleural effusions right greater than left EXAM: ULTRASOUND GUIDED RIGHT THORACENTESIS MEDICATIONS: Lidocaine 1% subcutaneous COMPLICATIONS: Possible small postprocedure pneumothorax on portable chest radiograph, asymptomatic. SIR LEVEL B - Normal therapy, includes overnight admission for observation.  PROCEDURE: An ultrasound guided thoracentesis was thoroughly discussed with the patient and questions answered. The benefits, risks, alternatives and complications were also discussed. The patient understands and wishes to proceed with the procedure. Written consent was obtained. Ultrasound was performed to localize and mark an adequate pocket of fluid in the right chest. The area was then prepped and draped in the normal sterile fashion. 1% Lidocaine was used for local anesthesia. Under ultrasound guidance a 6 Fr Safe-T-Centesis catheter was introduced. Thoracentesis was performed. The catheter was removed and a dressing applied. FINDINGS: A total of approximately 1.2 L of clear yellow fluid was removed. Samples were sent to the laboratory as requested by the clinical team. IMPRESSION: Successful ultrasound guided right thoracentesis yielding 1.2 L of pleural fluid. Electronically Signed   By: Lucrezia Europe M.D.   On: 09/08/2020 12:50     Medications:   . sodium chloride     .  aspirin EC  81 mg Oral BID  . heparin  5,000 Units Subcutaneous Q8H  . pravastatin  10 mg Oral QPM  . sodium chloride flush  3 mL Intravenous Q12H  . torsemide  40 mg Oral Daily   sodium chloride, acetaminophen, ALPRAZolam, melatonin, ondansetron (ZOFRAN) IV, sodium chloride flush  Assessment/ Plan:  86 y.o. male with hypertension, hyperlipidemia, aortic aneurysm, CKD, coronary artery disease, MGUS, history of sternotomy and aortic valve replacement admitted on 09/06/2020 for SOB (shortness of breath) [R06.02] Acute respiratory failure with hypoxia ([place]) [J96.01] Community acquired pneumonia, unspecified laterality [J18.9] Acute on chronic congestive heart failure, unspecified heart failure type (HCC) [I50.9]  #Acute kidney injury on chronic kidney disease stage IIIb Baseline creatinine of 1.68/GFR 39 from August 10, 2020 AKI likely secondary to ATN from concurrent illness and possible pneumonia Continue to monitor  renal parameters daily Urine output of 2300 cc. Slight uptake in creatinine is noted.  May be related to aggressive diuresis or low blood pressure. Hold amlodipine for now Also hold torsemide as blood pressure is low.  May be restarted once systolic blood pressure greater than 120  #Volume overload with lower extremity edema    At home, patient was taking furosemide 40 mg 1-2 times per day Status post thoracentesis December 23  #large right pleural effusion Covid negative Thoracentesis performed September 08, 2020. Pulmonary consult and evaluation ongoing    LOS: 2 Brodan Grewell 12/24/20211:58 PM  [place], [place]  Note: This note was prepared with Dragon dictation. Any transcription errors are unintentional

## 2020-09-09 NOTE — Evaluation (Signed)
Physical Therapy Evaluation Patient Details Name: [name] MRN: [id number] DOB: [birth year] Today's Date: 09/09/2020   History of Present Illness  [name] is a 86 y.o. male with medical history significant for chronic systolic CHF, chronic kidney disease stage IV, coronary artery disease, hypertension, and anxiety, presenting to emergency department 09/07/20 with worsening shortness of breath. Patient admitted for acute hypoxic respiratory failure; acute on chronic systolic CHF; Pleural effusions, possible pneumonia, AKI on CKD IV, and elevated troponin with low suspicion for ACS. Thorancentesis performed 09/08/20. 1.2 L yellow fluid was removed.  Patient states besides some soreness, he is able to breathe better after but still requiring oxygen supplementation. CT chest with bilateral atelectasis/infiltrate and pleural effusions, right more than left.  Concern of underlying malignancy.  Worsening renal function, unable to tolerate IV Lasix due to worsening renal function.    Clinical Impression  Patient is alert and oriented x 4 and able to provide a detailed history. He lives alone in a single story home with level entry. His family stops in once a day. Prior to hospitalization he was independent/mod I with ADLs and is able to complete some IADLs as well. He has had 3 falls in the last 6 months. Upon PT evaluation, patient was I with bed mobility and mod I using RW with transfers. He had some difficulty with ambulation due to shortness of breath but was able to go 200 feet with RW and SpO2 did not drop below 90% on RA. He did need a short standing rest at one point. Patient appears to have lost some functional mobility and independence and would benefit from improved function. He appears to have sound judgement and adequate functional strength and endurance to return home upon discharge. Recommend he return to outpatient PT upon discharge from the hospital to improve his functional mobility  and quality of life. Patient would benefit from skilled physical therapy to address impairments and functional limitations (PT Problem List below) to work towards stated goals and return to PLOF or maximal functional independence.       Follow Up Recommendations Outpatient PT    Equipment Recommendations       Recommendations for Other Services       Precautions / Restrictions Precautions Precautions: Fall Restrictions Weight Bearing Restrictions: No      Mobility  Bed Mobility Overal bed mobility: Independent             General bed mobility comments: Supine to sit with no assistance    Transfers Overall transfer level: Modified independent Equipment used: Rolling walker (2 wheeled)             General transfer comment: Patient completed sit <> stand transfer from bed to chair using RW with safe technique and good safety awareness.  Ambulation/Gait Ambulation/Gait assistance: Supervision Gait Distance (Feet): 200 Feet Assistive device: Rolling walker (2 wheeled) Gait Pattern/deviations: Antalgic Gait velocity: slow   General Gait Details: Patient with antalgic gait favoring R LE. Mildly stooped posture. Quick fatigue. SpO2 down to 90% on RA. Short standing break.  Stairs            Wheelchair Mobility    Modified Rankin (Stroke Patients Only)       Balance Overall balance assessment: Needs assistance Sitting-balance support: No upper extremity supported Sitting balance-Leahy Scale: Normal     Standing balance support: During functional activity Standing balance-Leahy Scale: Fair Standing balance comment: patient reliant on RW for functional mobility.  Pertinent Vitals/Pain Pain Assessment: No/denies pain    Home Living Family/patient expects to be discharged to:: Private residence Living Arrangements: Alone Available Help at Discharge: Family;Available PRN/intermittently Type of Home:  House Home Access: Level entry     Home Layout: One level Home Equipment: Walker - 2 wheels;Shower seat;Walker - 4 wheels Additional Comments: Teacher, English as a foreign language    Prior Function Level of Independence: Needs assistance   Gait / Transfers Assistance Needed: ambulates in home and in the community with rollator or RW  ADL's / Homemaking Assistance Needed: is independent or mod I with all ADLs and some IADLs. Does his own laundry and meal prep. Needs help for housework.  Comments: Patient reports he has been attending OP PT at [facility] clinic. He has fallen 3 times in the last 6 months. once he tripped on some boxes at a store and broke his femur, then he tanked his feet, and once he was trying to vaccume while using his walker and it went one way and he went the other.     Hand Dominance   Dominant Hand: Right    Extremity/Trunk Assessment   Upper Extremity Assessment Upper Extremity Assessment: Overall WFL for tasks assessed    Lower Extremity Assessment Lower Extremity Assessment: RLE deficits/detail RLE Deficits / Details: 3/5 MMT due to hx of femur fracture with surgical repair LLE Deficits / Details: Grossly at least 4/5 MMT    Cervical / Trunk Assessment Cervical / Trunk Assessment: Normal  Communication   Communication: No difficulties  Cognition Arousal/Alertness: Awake/alert Behavior During Therapy: WFL for tasks assessed/performed Overall Cognitive Status: Within Functional Limits for tasks assessed                                 General Comments: Patient frustrated that an procedure the doctor had reccomended to him had not been performed yet      General Comments General comments (skin integrity, edema, etc.): Patient highly motivated to go home.    Exercises Other Exercises Other Exercises: educated patient on role of PT in acute care setting, D/C reccomendations. Practiced ambulation and transfers.   Assessment/Plan    PT Assessment Patient  needs continued PT services  PT Problem List Decreased strength;Cardiopulmonary status limiting activity;Decreased activity tolerance;Decreased balance;Decreased mobility       PT Treatment Interventions DME instruction;Balance training;Gait training;Neuromuscular re-education;Functional mobility training;Patient/family education;Therapeutic activities;Therapeutic exercise    PT Goals (Current goals can be found in the Care Plan section)  Acute Rehab PT Goals Patient Stated Goal: to get thoracentesis of other lung and go home ASAP PT Goal Formulation: With patient Time For Goal Achievement: 09/23/20 Potential to Achieve Goals: Fair    Frequency Min 2X/week   Barriers to discharge   lives alone    Co-evaluation               AM-PAC PT "6 Clicks" Mobility  Outcome Measure Help needed turning from your back to your side while in a flat bed without using bedrails?: None Help needed moving from lying on your back to sitting on the side of a flat bed without using bedrails?: None Help needed moving to and from a bed to a chair (including a wheelchair)?: None Help needed standing up from a chair using your arms (e.g., wheelchair or bedside chair)?: None Help needed to walk in hospital room?: A Little Help needed climbing 3-5 steps with a railing? : A Lot 6 Click  Score: 21    End of Session Equipment Utilized During Treatment:  (declined gait belt) Activity Tolerance: Patient tolerated treatment well;Patient limited by fatigue Patient left: in chair;with call bell/phone within reach Nurse Communication: Mobility status PT Visit Diagnosis: Difficulty in walking, not elsewhere classified (R26.2);Unsteadiness on feet (R26.81);Muscle weakness (generalized) (M62.81)    Time: 3010-4045 PT Time Calculation (min) (ACUTE ONLY): 24 min   Charges:   PT Evaluation $PT Eval Low Complexity: 1 Low PT Treatments $Gait Training: 8-22 mins        Everlean Alstrom. Graylon Good, PT, DPT 09/09/20,  3:42 PM

## 2020-09-10 LAB — CBC
HCT: 39.2 % (ref 39.0–52.0)
Hemoglobin: 13.2 g/dL (ref 13.0–17.0)
MCH: 30.1 pg (ref 26.0–34.0)
MCHC: 33.7 g/dL (ref 30.0–36.0)
MCV: 89.3 fL (ref 80.0–100.0)
Platelets: 214 10*3/uL (ref 150–400)
RBC: 4.39 MIL/uL (ref 4.22–5.81)
RDW: 16.1 % — ABNORMAL HIGH (ref 11.5–15.5)
WBC: 7.8 10*3/uL (ref 4.0–10.5)
nRBC: 0 % (ref 0.0–0.2)

## 2020-09-10 LAB — BASIC METABOLIC PANEL
Anion gap: 12 (ref 5–15)
BUN: 48 mg/dL — ABNORMAL HIGH (ref 8–23)
CO2: 24 mmol/L (ref 22–32)
Calcium: 8.7 mg/dL — ABNORMAL LOW (ref 8.9–10.3)
Chloride: 102 mmol/L (ref 98–111)
Creatinine, Ser: 2.31 mg/dL — ABNORMAL HIGH (ref 0.61–1.24)
GFR, Estimated: 27 mL/min — ABNORMAL LOW (ref >60)
Glucose, Bld: 96 mg/dL (ref 70–99)
Potassium: 3.5 mmol/L (ref 3.5–5.1)
Sodium: 138 mmol/L (ref 135–145)

## 2020-09-10 NOTE — TOC Transition Note (Signed)
Transition of Care [facility]) - CM/SW Discharge Note   Patient Details  Name: [name] MRN: [id number] Date of Birth: October 27, 1930  [facility] of Care [facility]) CM/SW Contact:  Izola Price, RN Phone Number: 09/10/2020, 10:45 AM   Clinical Narrative:   To be discharged  Home/Self Care with outpatient PT recommended. No home oxygen needed per provider on discharge. [name] RN CM    Final next level of care: Home/Self Care Barriers to Discharge: Barriers Resolved   Patient Goals and CMS Choice        Discharge Placement                       Discharge Plan and Services                DME Arranged: N/A DME Agency: NA (No Home oxygen now needed per provider.)       HH Arranged:  (Outpatient PT now the recommendation.) HH Agency: NA        Social Determinants of Health (SDOH) Interventions     Readmission Risk Interventions No flowsheet data found.

## 2020-09-10 NOTE — TOC Progression Note (Signed)
Transition of Care [facility]) - Progression Note    Patient Details  Name: [name] MRN: [id number] Date of Birth: Dec 08, 1930  Transition of Care [facility]) CM/SW Contact  Izola Price, RN Phone Number: 09/10/2020, 10:23 AM  Clinical Narrative:   Orders indicate Home/Self Care with PT rec. Outpatient PT. Provider messaged and no home oxygen is now needed. [name] [facility]         Expected Discharge Plan and Services           Expected Discharge Date: 09/10/20                                     Social Determinants of Health (SDOH) Interventions    Readmission Risk Interventions No flowsheet data found.

## 2020-09-10 NOTE — Progress Notes (Signed)
[facility] Cardiology [facility] Encounter Note  Patient: [name] / Admit Date: 09/06/2020 / Date of Encounter: 09/10/2020, 5:47 AM   Subjective: Patient overall has done well since admission with good urine output.  Patient is had 4 L diuresis since admission.  In addition to that the patient had a right thoracentesis with significant improvement of breathing.  He still does have a slight amount of bibasilar crackles but significantly improved.  The patient was changed to torsemide with significant urine output with no significant changes in glomerular filtration rate currently 28.  Telemetry has shown normal sinus rhythm with frequent preventricular contractions.  No further symptoms of angina have occurred  Review of Systems: Positive for: Shortness of breath Negative for: Vision change, hearing change, syncope, dizziness, nausea, vomiting,diarrhea, bloody stool, stomach pain, cough, congestion, diaphoresis, urinary frequency, urinary pain,skin lesions, skin rashes Others previously listed  Objective: Telemetry: Normal sinus rhythm with frequent preventricular contractions Physical Exam: Blood pressure 115/80, pulse 62, temperature 97.6 F (36.4 C), resp. rate 18, height 5\' 7"  (1.702 m), weight 74.4 kg, SpO2 97 %. Body mass index is 25.69 kg/m. General: Well developed, well nourished, in no acute distress. Head: Normocephalic, atraumatic, sclera non-icteric, no xanthomas, nares are without discharge. Neck: No apparent masses Lungs: Normal respirations with few wheezes, no rhonchi, no rales , bibasilar crackles   Heart: Regular rate and rhythm, normal S1 S2, 2-3+ left sternal border murmur, no rub, no gallop, PMI is normal size and placement, carotid upstroke normal without bruit, jugular venous pressure normal Abdomen: Soft, non-tender, non-distended with normoactive bowel sounds. No hepatosplenomegaly. Abdominal aorta is normal size without bruit Extremities: Trace edema, no  clubbing, no cyanosis, no ulcers,  Peripheral: 2+ radial, 2+ femoral, 2+ dorsal pedal pulses Neuro: Alert and oriented. Moves all extremities spontaneously. Psych:  Responds to questions appropriately with a normal affect.   Intake/Output Summary (Last 24 hours) at 09/10/2020 0547 Last data filed at 09/10/2020 0310 Gross per 24 hour  Intake 600 ml  Output 1250 ml  Net -650 ml    Inpatient Medications:  . aspirin EC  81 mg Oral BID  . heparin  5,000 Units Subcutaneous Q8H  . pravastatin  10 mg Oral QPM  . sodium chloride flush  3 mL Intravenous Q12H   Infusions:  . sodium chloride      Labs: Recent Labs    09/08/20 0517 09/09/20 0559  NA 138 141  K 3.5 3.5  CL 104 106  CO2 23 24  GLUCOSE 103* 96  BUN 42* 42*  CREATININE 1.94* 2.24*  CALCIUM 8.9 8.9   No results for input(s): AST, ALT, ALKPHOS, BILITOT, PROT, ALBUMIN in the last 72 hours. No results for input(s): WBC, NEUTROABS, HGB, HCT, MCV, PLT in the last 72 hours. No results for input(s): CKTOTAL, CKMB, TROPONINI in the last 72 hours. Invalid input(s): POCBNP No results for input(s): HGBA1C in the last 72 hours.   Weights: Filed Weights   09/06/20 2334 09/10/20 0233  Weight: 81.6 kg 74.4 kg     Radiology/Studies:  DG Chest 1 View  Result Date: 09/08/2020 CLINICAL DATA:  Post biopsy, RIGHT lung pneumothorax EXAM: CHEST  1 VIEW COMPARISON:  Portable exam 1304 hours compared to 1053 hours FINDINGS: Enlargement of cardiac silhouette post CABG and AVR. Mediastinal contours and pulmonary vascularity normal. Atherosclerotic calcification aorta. Tiny persistent RIGHT apex pneumothorax. Bibasilar pleural effusions and atelectasis. Questionable perihilar infiltrate. Bones demineralized. IMPRESSION: Persistent tiny RIGHT apex pneumothorax. Electronically Signed   By:  Lavonia Dana M.D.   On: 09/08/2020 13:11   DG Chest 2 View  Result Date: 09/09/2020 CLINICAL DATA:  Shortness of breath EXAM: CHEST - 2 VIEW  COMPARISON:  09/08/2020 FINDINGS: Cardiac shadow is enlarged. Postsurgical changes are again seen and stable. Bilateral pleural effusions are noted relatively stable from the prior exam. No new focal infiltrate is seen. IMPRESSION: Stable bilateral effusions. Electronically Signed   By: Inez Catalina M.D.   On: 09/09/2020 11:38   CT Chest Wo Contrast  Result Date: 09/07/2020 CLINICAL DATA:  Cough and shortness of breath. EXAM: CT CHEST WITHOUT CONTRAST TECHNIQUE: Multidetector CT imaging of the chest was performed following the standard protocol without IV contrast. COMPARISON:  Jan 27, 2018 FINDINGS: Cardiovascular: There is marked severity calcification of the aortic arch and descending thoracic aorta. Calcification of the ascending aorta is also noted. There is mild cardiomegaly with marked severity coronary artery calcification. An artificial aortic valve is seen. No pericardial effusion. Mediastinum/Nodes: Multiple sternal wires are present. Multiple subcentimeter calcified and noncalcified pretracheal and AP window lymph nodes are seen. The thyroid gland, trachea and esophagus demonstrate no significant abnormalities. Lungs/Pleura: Moderate to marked severity areas of consolidation seen within the bilateral lower lobes. These areas contain large clusters of tiny hyperdense foci. A 7 mm calcified granuloma is seen within the anterior aspect of the right middle lobe. There is a moderate-sized right pleural effusion. A small left pleural effusion is also seen. No pneumothorax is identified. Upper Abdomen: Right upper quadrant surgical clips are seen. Bilateral renal cysts are noted. Musculoskeletal: A compression fracture deformity of indeterminate age is seen at the level of T10. This represents a new finding when compared to the prior study. Multiple chronic left-sided rib fractures are also seen. Degenerative changes are noted throughout the thoracic spine. IMPRESSION: 1. Moderate to marked severity areas  of bilateral lower lobe consolidation likely secondary to areas of atelectasis and/or infiltrate. Follow-up to resolution is recommended to exclude the presence of an underlying neoplastic process. 2. Moderate-sized right and small left pleural effusions. 3. Mild cardiomegaly with marked severity coronary artery calcification. 4. Evidence of prior median sternotomy and aortic valve replacement. 5. Bilateral renal cysts. 6. Compression fracture deformity of indeterminate age at the level of T10. This represents a new finding when compared to the prior study. MRI correlation is recommended. 7. Multiple chronic left-sided rib fractures. 8. Aortic atherosclerosis. Aortic Atherosclerosis (ICD10-I70.0). Electronically Signed   By: Virgina Norfolk M.D.   On: 09/07/2020 01:47   DG Chest Port 1 View  Result Date: 09/08/2020 CLINICAL DATA:  Status post thoracentesis EXAM: PORTABLE CHEST 1 VIEW COMPARISON:  CT chest dated 09/07/2020 FINDINGS: Small right pleural effusion, decreased status post thoracentesis. Suspected tiny right apical pneumothorax. Small left pleural effusion. Associated left lower lobe opacity, likely atelectasis. Cardiomegaly. Prosthetic valve. Postsurgical changes related to prior CABG. Median sternotomy. IMPRESSION: Suspected tiny right apical pneumothorax. These results were notified at the time of interpretation on 09/08/2020 at 11:15 am to provider DANIEL HASSELL , who acknowledged these results. Small right pleural effusion, decreased status post thoracentesis. Small left pleural effusion. Associated left lower lobe opacity, likely atelectasis. Electronically Signed   By: Julian Hy M.D.   On: 09/08/2020 11:16   DG Chest Portable 1 View  Result Date: 09/07/2020 CLINICAL DATA:  Shortness of breath. EXAM: PORTABLE CHEST 1 VIEW COMPARISON:  August 07, 2020 FINDINGS: Multiple sternal wires are seen. Diffusely prominent interstitial lung markings are noted with mild to moderate  severity areas of atelectasis and/or infiltrate within the bilateral lung bases. A stable moderate size right pleural effusion is seen with a small, stable left pleural effusion. No pneumothorax is identified. There is mild to moderate severity enlargement of the cardiac silhouette. An artificial aortic valve is also noted. There is moderate severity calcification of the aortic arch. Degenerative changes seen throughout the thoracic spine. IMPRESSION: 1. Evidence of prior median sternotomy/CABG. 2. Stable bilateral pleural effusions with mild to moderate severity bibasilar atelectasis and/or infiltrate. Electronically Signed   By: Virgina Norfolk M.D.   On: 09/07/2020 00:22   US THORACENTESIS ASP PLEURAL SPACE W/IMG GUIDE  Result Date: 09/08/2020 INDICATION: Cough, shortness of breath, pleural effusions right greater than left EXAM: ULTRASOUND GUIDED RIGHT THORACENTESIS MEDICATIONS: Lidocaine 1% subcutaneous COMPLICATIONS: Possible small postprocedure pneumothorax on portable chest radiograph, asymptomatic. SIR LEVEL B - Normal therapy, includes overnight admission for observation. PROCEDURE: An ultrasound guided thoracentesis was thoroughly discussed with the patient and questions answered. The benefits, risks, alternatives and complications were also discussed. The patient understands and wishes to proceed with the procedure. Written consent was obtained. Ultrasound was performed to localize and mark an adequate pocket of fluid in the right chest. The area was then prepped and draped in the normal sterile fashion. 1% Lidocaine was used for local anesthesia. Under ultrasound guidance a 6 Fr Safe-T-Centesis catheter was introduced. Thoracentesis was performed. The catheter was removed and a dressing applied. FINDINGS: A total of approximately 1.2 L of clear yellow fluid was removed. Samples were sent to the laboratory as requested by the clinical team. IMPRESSION: Successful ultrasound guided right  thoracentesis yielding 1.2 L of pleural fluid. Electronically Signed   By: Lucrezia Europe M.D.   On: 09/08/2020 12:50     Assessment and Recommendation  86 y.o. male with known coronary artery disease status post coronary bypass graft aortic valve replacement hypertension hyperlipidemia chronic kidney disease stage IV with acute on chronic systolic dysfunction congestive heart failure status post thoracentesis and no current evidence of myocardial infarction with good diuresis with adjustments of medication management 1.  Reinstatement of torsemide upon discharge at home with 20 to 40 mg as per nephrology 2.  Currently holding medication management for hypertension due to lower blood pressures at this time but will consider reinstatement as an outpatient 3.  Continue high intensity cholesterol therapy with pravastatin 4.  If ambulating well and feeling relatively improved would discharge home from cardiac standpoint with follow-up next week  Signed, Serafina Royals M.D. FACC

## 2020-09-10 NOTE — Discharge Summary (Signed)
Physician Discharge Summary  [name]:[id number] DOB: 09-13-1931 DOA: 09/06/2020  PCP: Baxter Hire, MD  Admit date: 09/06/2020 Discharge date: 09/10/2020  Admitted From: Home Disposition:  Home  Recommendations for Outpatient Follow-up:  1. Follow up with PCP in 1-2 weeks 2. follow-up with cardiology next week 3. follow-up with nephrology next week 4. follow-up with pulmonology in 1 to 2 weeks 5. Please obtain BMP/CBC in one week 6. Please follow up on the following pending results: Pleural fluid cytology.  Home Health: Yes Equipment/Devices: None Discharge Condition: Stable CODE STATUS: Full Diet recommendation: Heart Healthy / Carb Modified   Brief/Interim Summary: [name] a 86 y.o.malewith medical history significant forchronic systolic CHF, chronic kidney disease stage IV, coronary artery disease, hypertension, and anxiety, presenting to emergency department with worsening shortness of breath. The patient reports that he developed progressive dyspnea beginning about a week ago, was started on Augmentin for suspected pneumonia a few days ago, but has continued to worsen despite that. He reports a productive cough but denies any fevers or chest pain. He has noted some slight increase in his chronic bilateral lower extremity swelling. He has been sleeping on 2 pillows recently. No calf tenderness or hemoptysis. Was hypoxic requiring 4 to 5 L of oxygen. COVID-19 negative. CT chest with bilateral atelectasis/infiltrate and pleural effusions, right more than left. Concern of underlying malignancy. Worsening renal function, unable to tolerate IV Lasix due to worsening renal function.  Had thoracentesis and labs are more consistent with transudative fluid.  Cultures remain negative.  Cytology results pending.  Most likely secondary to his CHF and CKD.  Breathing status improved and he was able to weaned off from oxygen after thoracentesis. He was transitioned  to torsemide 40 mg daily and diuresing well. Home dose of amlodipine and irbesartan was discontinued to keep the blood pressure little elevated to increase renal perfusion.  He will follow-up closely with his cardiologist and nephrologist and antihypertensives can be restarted if needed.  Renal function stable on discharge.  Nephrology, cardiology and pulmonology all were consulted and he will follow up with them closely as an outpatient.  Patient has mildly elevated troponin with a flat curve, no chest pain most likely secondary to demand ischemia.  CT chest also showed a T10 compression deformity, patient was not complaining of any pain and it can be further assessed with MRI if needed as an outpatient.  We will continue with rest of his home meds except antihypertensives and follow-up with his providers.  Discharge Diagnoses:  Principal Problem:   Acute respiratory failure with hypoxia (HCC) Active Problems:   Benign essential HTN   CAD in native artery   Acute renal failure superimposed on stage 4 chronic kidney disease (HCC)   Elevated troponin   Pleural effusion   Acute on chronic systolic CHF (congestive heart failure) (HCC)   Thoracic compression fracture [facility])  Discharge Instructions  Discharge Instructions    Diet - low sodium heart healthy   Complete by: As directed    Discharge instructions   Complete by: As directed    It was pleasure taking care of you. We are stopping your blood pressure medications which include amlodipine and irbesartan and you will continue with torsemide to help with this fluid.  Your blood pressure was on lower side and that can also affect your kidneys. Please follow-up with your cardiologist and nephrologist next week.   Discharge wound care:   Complete by: As directed    You  have some pressure injuries to your skin, continue with your wound care and you need to continuously changing your position. Using a mattress which can change pressure  will help.   Increase activity slowly   Complete by: As directed      Allergies as of 09/10/2020      Reactions   Morphine Anaphylaxis   Oxycodone Other (See Comments)   Hypotension and bradycardia   Carvedilol    Dizziness and syncope    Iodinated Diagnostic Agents Rash   Other reaction(s): Asthenia (finding), Other (qualifier value) paralyzed legs Other reaction(s): RASH   Lisinopril Cough, Other (See Comments)      Medication List    STOP taking these medications   amLODipine 5 MG tablet Commonly known as: NORVASC   amoxicillin-clavulanate 500-125 MG tablet Commonly known as: AUGMENTIN   furosemide 40 MG tablet Commonly known as: LASIX   irbesartan 150 MG tablet Commonly known as: AVAPRO     TAKE these medications   ALPRAZolam 0.25 MG tablet Commonly known as: XANAX Take 0.25 mg by mouth 2 (two) times daily as needed.   aspirin EC 81 MG tablet Take 1 tablet (81 mg total) by mouth 2 (two) times daily. Swallow whole.   melatonin 3 MG Tabs tablet Take 1.5 mg by mouth at bedtime as needed (sleep).   pravastatin 20 MG tablet Commonly known as: PRAVACHOL Take 20 mg by mouth every evening.   sodium bicarbonate 650 MG tablet Take 1,300 mg by mouth 2 (two) times daily.   torsemide 20 MG tablet Commonly known as: DEMADEX Take 2 tablets (40 mg total) by mouth daily.   traMADol 50 MG tablet Commonly known as: ULTRAM Take 50 mg by mouth every 4 (four) hours as needed.            Durable Medical Equipment  (From admission, onward)         Start     Ordered   09/08/20 1608  For home use only DME oxygen  Once       Question Answer Comment  Length of Need 6 Months   Mode or (Route) Nasal cannula   Liters per Minute 2   Oxygen delivery system Gas      09/08/20 1607           Discharge Care Instructions  (From admission, onward)         Start     Ordered   09/10/20 0000  Discharge wound care:       Comments: You have some pressure injuries  to your skin, continue with your wound care and you need to continuously changing your position. Using a mattress which can change pressure will help.   09/10/20 0957          Follow-up Information    [facility] Follow up on 09/20/2020.   Specialty: Cardiology Why: at 12:30pm. Enter through the [place] entrance Contact information: [place]       Corey Skains, MD Follow up in 1 week(s).   Specialty: Cardiology Contact information: [facility] [place] [address] [phone]        Baxter Hire, MD. Schedule an appointment as soon as possible for a visit.   Specialty: Internal Medicine Contact information: [place] [address] [phone]        Ottie Glazier, MD. Schedule an appointment  as soon as possible for a visit.   Specialty: Pulmonary Disease Contact information: [place] [address] [phone]        Anthonette Legato, MD. Schedule an appointment as soon as possible for a visit.   Specialty: Nephrology Contact information: [place] [address] [phone]              Allergies  Allergen Reactions  . Morphine Anaphylaxis  . Oxycodone Other (See Comments)    Hypotension and bradycardia  . Carvedilol     Dizziness and syncope   . Iodinated Diagnostic Agents Rash    Other reaction(s): Asthenia (finding), Other (qualifier value) paralyzed legs Other reaction(s): RASH   . Lisinopril Cough and Other (See Comments)    Consultations:  Cardiology  nephrology  pulmonology  Procedures/Studies: DG Chest 1 View  Result Date: 09/08/2020 CLINICAL DATA:  Post biopsy, RIGHT lung pneumothorax EXAM: CHEST  1 VIEW COMPARISON:  Portable exam 1304 hours compared to 1053 hours FINDINGS: Enlargement of cardiac  silhouette post CABG and AVR. Mediastinal contours and pulmonary vascularity normal. Atherosclerotic calcification aorta. Tiny persistent RIGHT apex pneumothorax. Bibasilar pleural effusions and atelectasis. Questionable perihilar infiltrate. Bones demineralized. IMPRESSION: Persistent tiny RIGHT apex pneumothorax. Electronically Signed   By: Lavonia Dana M.D.   On: 09/08/2020 13:11   DG Chest 2 View  Result Date: 09/09/2020 CLINICAL DATA:  Shortness of breath EXAM: CHEST - 2 VIEW COMPARISON:  09/08/2020 FINDINGS: Cardiac shadow is enlarged. Postsurgical changes are again seen and stable. Bilateral pleural effusions are noted relatively stable from the prior exam. No new focal infiltrate is seen. IMPRESSION: Stable bilateral effusions. Electronically Signed   By: Inez Catalina M.D.   On: 09/09/2020 11:38   CT Chest Wo Contrast  Result Date: 09/07/2020 CLINICAL DATA:  Cough and shortness of breath. EXAM: CT CHEST WITHOUT CONTRAST TECHNIQUE: Multidetector CT imaging of the chest was performed following the standard protocol without IV contrast. COMPARISON:  Jan 27, 2018 FINDINGS: Cardiovascular: There is marked severity calcification of the aortic arch and descending thoracic aorta. Calcification of the ascending aorta is also noted. There is mild cardiomegaly with marked severity coronary artery calcification. An artificial aortic valve is seen. No pericardial effusion. Mediastinum/Nodes: Multiple sternal wires are present. Multiple subcentimeter calcified and noncalcified pretracheal and AP window lymph nodes are seen. The thyroid gland, trachea and esophagus demonstrate no significant abnormalities. Lungs/Pleura: Moderate to marked severity areas of consolidation seen within the bilateral lower lobes. These areas contain large clusters of tiny hyperdense foci. A 7 mm calcified granuloma is seen within the anterior aspect of the right middle lobe. There is a moderate-sized right pleural effusion. A small  left pleural effusion is also seen. No pneumothorax is identified. Upper Abdomen: Right upper quadrant surgical clips are seen. Bilateral renal cysts are noted. Musculoskeletal: A compression fracture deformity of indeterminate age is seen at the level of T10. This represents a new finding when compared to the prior study. Multiple chronic left-sided rib fractures are also seen. Degenerative changes are noted throughout the thoracic spine. IMPRESSION: 1. Moderate to marked severity areas of bilateral lower lobe consolidation likely secondary to areas of atelectasis and/or infiltrate. Follow-up to resolution is recommended to exclude the presence of an underlying neoplastic process. 2. Moderate-sized right and small left pleural effusions. 3. Mild cardiomegaly with marked severity coronary artery calcification. 4. Evidence of prior median sternotomy and aortic valve replacement. 5. Bilateral renal cysts. 6. Compression fracture deformity of  indeterminate age at the level of T10. This represents a new finding when compared to the prior study. MRI correlation is recommended. 7. Multiple chronic left-sided rib fractures. 8. Aortic atherosclerosis. Aortic Atherosclerosis (ICD10-I70.0). Electronically Signed   By: Virgina Norfolk M.D.   On: 09/07/2020 01:47   DG Chest Port 1 View  Result Date: 09/08/2020 CLINICAL DATA:  Status post thoracentesis EXAM: PORTABLE CHEST 1 VIEW COMPARISON:  CT chest dated 09/07/2020 FINDINGS: Small right pleural effusion, decreased status post thoracentesis. Suspected tiny right apical pneumothorax. Small left pleural effusion. Associated left lower lobe opacity, likely atelectasis. Cardiomegaly. Prosthetic valve. Postsurgical changes related to prior CABG. Median sternotomy. IMPRESSION: Suspected tiny right apical pneumothorax. These results were notified at the time of interpretation on 09/08/2020 at 11:15 am to provider DANIEL HASSELL , who acknowledged these results. Small right  pleural effusion, decreased status post thoracentesis. Small left pleural effusion. Associated left lower lobe opacity, likely atelectasis. Electronically Signed   By: Julian Hy M.D.   On: 09/08/2020 11:16   DG Chest Portable 1 View  Result Date: 09/07/2020 CLINICAL DATA:  Shortness of breath. EXAM: PORTABLE CHEST 1 VIEW COMPARISON:  August 07, 2020 FINDINGS: Multiple sternal wires are seen. Diffusely prominent interstitial lung markings are noted with mild to moderate severity areas of atelectasis and/or infiltrate within the bilateral lung bases. A stable moderate size right pleural effusion is seen with a small, stable left pleural effusion. No pneumothorax is identified. There is mild to moderate severity enlargement of the cardiac silhouette. An artificial aortic valve is also noted. There is moderate severity calcification of the aortic arch. Degenerative changes seen throughout the thoracic spine. IMPRESSION: 1. Evidence of prior median sternotomy/CABG. 2. Stable bilateral pleural effusions with mild to moderate severity bibasilar atelectasis and/or infiltrate. Electronically Signed   By: Virgina Norfolk M.D.   On: 09/07/2020 00:22   US THORACENTESIS ASP PLEURAL SPACE W/IMG GUIDE  Result Date: 09/08/2020 INDICATION: Cough, shortness of breath, pleural effusions right greater than left EXAM: ULTRASOUND GUIDED RIGHT THORACENTESIS MEDICATIONS: Lidocaine 1% subcutaneous COMPLICATIONS: Possible small postprocedure pneumothorax on portable chest radiograph, asymptomatic. SIR LEVEL B - Normal therapy, includes overnight admission for observation. PROCEDURE: An ultrasound guided thoracentesis was thoroughly discussed with the patient and questions answered. The benefits, risks, alternatives and complications were also discussed. The patient understands and wishes to proceed with the procedure. Written consent was obtained. Ultrasound was performed to localize and mark an adequate pocket of fluid  in the right chest. The area was then prepped and draped in the normal sterile fashion. 1% Lidocaine was used for local anesthesia. Under ultrasound guidance a 6 Fr Safe-T-Centesis catheter was introduced. Thoracentesis was performed. The catheter was removed and a dressing applied. FINDINGS: A total of approximately 1.2 L of clear yellow fluid was removed. Samples were sent to the laboratory as requested by the clinical team. IMPRESSION: Successful ultrasound guided right thoracentesis yielding 1.2 L of pleural fluid. Electronically Signed   By: Lucrezia Europe M.D.   On: 09/08/2020 12:50     Subjective: Patient was feeling better when seen today.  Just feeling little weak, stating that is because he is staying in bed most of the time.  Discussed medication changes with patient and daughter-in-law on phone while in the room and he seems understanding.  Discharge Exam: Vitals:   09/10/20 0544 09/10/20 0838  BP: 115/80 114/71  Pulse: 62 92  Resp: 18 17  Temp: 97.6 F (36.4 C) 97.6 F (36.4 C)  SpO2:  97% 94%   Vitals:   09/10/20 0220 09/10/20 0233 09/10/20 0544 09/10/20 0838  BP: 112/69  115/80 114/71  Pulse: 94  62 92  Resp: 16  18 17   Temp: 97.6 F (36.4 C)  97.6 F (36.4 C) 97.6 F (36.4 C)  TempSrc:      SpO2: 90%  97% 94%  Weight:  74.4 kg    Height:        General: Pt is alert, awake, not in acute distress Cardiovascular: RRR, S1/S2 +, no rubs, no gallops Respiratory: CTA bilaterally, no wheezing, no rhonchi Abdominal: Soft, NT, ND, bowel sounds + Extremities: no edema, no cyanosis   The results of significant diagnostics from this hospitalization (including imaging, microbiology, ancillary and laboratory) are listed below for reference.    Microbiology: Recent Results (from the past 240 hour(s))  Resp Panel by RT-PCR (Flu A&B, Covid) Nasopharyngeal Swab     Status: None   Collection Time: 09/06/20 11:42 PM   Specimen: Nasopharyngeal Swab; Nasopharyngeal(NP) swabs in vial  transport medium  Result Value Ref Range Status   SARS Coronavirus 2 by RT PCR NEGATIVE NEGATIVE Final    Comment: (NOTE) SARS-CoV-2 target nucleic acids are NOT DETECTED.  The SARS-CoV-2 RNA is generally detectable in upper respiratory specimens during the acute phase of infection. The lowest concentration of SARS-CoV-2 viral copies this assay can detect is 138 copies/mL. A negative result does not preclude SARS-Cov-2 infection and should not be used as the sole basis for treatment or other patient management decisions. A negative result may occur with  improper specimen collection/handling, submission of specimen other than nasopharyngeal swab, presence of viral mutation(s) within the areas targeted by this assay, and inadequate number of viral copies(<138 copies/mL). A negative result must be combined with clinical observations, patient history, and epidemiological information. The expected result is Negative.  Fact Sheet for Patients:  EntrepreneurPulse.com.au  Fact Sheet for Healthcare Providers:  IncredibleEmployment.be  This test is no t yet approved or cleared by the [place] FDA and  has been authorized for detection and/or diagnosis of SARS-CoV-2 by FDA under an Emergency Use Authorization (EUA). This EUA will remain  in effect (meaning this test can be used) for the duration of the COVID-19 declaration under Section 564(b)(1) of the Act, 21 U.S.C.section 360bbb-3(b)(1), unless the authorization is terminated  or revoked sooner.       Influenza A by PCR NEGATIVE NEGATIVE Final   Influenza B by PCR NEGATIVE NEGATIVE Final    Comment: (NOTE) The Xpert Xpress SARS-CoV-2/FLU/RSV plus assay is intended as an aid in the diagnosis of influenza from Nasopharyngeal swab specimens and should not be used as a sole basis for treatment. Nasal washings and aspirates are unacceptable for Xpert Xpress SARS-CoV-2/FLU/RSV testing.  Fact  Sheet for Patients: EntrepreneurPulse.com.au  Fact Sheet for Healthcare Providers: IncredibleEmployment.be  This test is not yet approved or cleared by the [place] FDA and has been authorized for detection and/or diagnosis of SARS-CoV-2 by FDA under an Emergency Use Authorization (EUA). This EUA will remain in effect (meaning this test can be used) for the duration of the COVID-19 declaration under Section 564(b)(1) of the Act, 21 U.S.C. section 360bbb-3(b)(1), unless the authorization is terminated or revoked.  Performed at [facility], [address] [place], [place] [address]   Blood culture (routine x 2)     Status: None (Preliminary result)   Collection Time: 09/06/20 11:42 PM   Specimen: BLOOD  Result Value Ref Range Status  Specimen Description BLOOD RIGHT ASSIST CONTROL  Final   Special Requests   Final    BOTTLES DRAWN AEROBIC AND ANAEROBIC Blood Culture adequate volume   Culture   Final    NO GROWTH 3 DAYS Performed at [facility], [address] [place], [place] [address]    Report Status PENDING  Incomplete  Blood culture (routine x 2)     Status: None (Preliminary result)   Collection Time: 09/06/20 11:42 PM   Specimen: BLOOD  Result Value Ref Range Status   Specimen Description BLOOD LEFT ASSIST CONTROL  Final   Special Requests   Final    BOTTLES DRAWN AEROBIC AND ANAEROBIC Blood Culture results may not be optimal due to an excessive volume of blood received in culture bottles   Culture   Final    NO GROWTH 3 DAYS Performed at [facility], [address], [place], [place] [address]    Report Status PENDING  Incomplete  Body fluid culture     Status: None (Preliminary result)   Collection Time: 09/08/20 10:44 AM   Specimen: PATH Cytology Pleural fluid  Result Value Ref Range Status   Specimen Description   Final    PLEURAL Performed at [facility], [address],  [place], [place] [address]    Special Requests   Final    NONE Performed at [facility], [address] [place], [place] [address]    Gram Stain   Final    RARE WBC PRESENT,BOTH PMN AND MONONUCLEAR NO ORGANISMS SEEN    Culture   Final    NO GROWTH 2 DAYS Performed at [facility], [place] [address] [place], [place] [address]    Report Status PENDING  Incomplete     Labs: BNP (last 3 results) Recent Labs    08/05/20 2018 09/06/20 2342  BNP 2,713.8* 4,696.2*   Basic Metabolic Panel: Recent Labs  Lab 09/06/20 2342 09/07/20 0452 09/08/20 0517 09/09/20 0559 09/10/20 0543  NA 142  --  138 141 138  K 3.5  --  3.5 3.5 3.5  CL 105  --  104 106 102  CO2 22  --  23 24 24   GLUCOSE 121*  --  103* 96 96  BUN 51*  --  42* 42* 48*  CREATININE 2.19*  --  1.94* 2.24* 2.31*  CALCIUM 8.9  --  8.9 8.9 8.7*  MG  --  2.4  --   --   --    Liver Function Tests: Recent Labs  Lab 09/06/20 2342  AST 35  ALT 32  ALKPHOS 138*  BILITOT 1.1  PROT 7.2  ALBUMIN 3.7   No results for input(s): LIPASE, AMYLASE in the last 168 hours. No results for input(s): AMMONIA in the last 168 hours. CBC: Recent Labs  Lab 09/06/20 2342 09/10/20 0543  WBC 7.7 7.8  NEUTROABS 4.5  --   HGB 13.3 13.2  HCT 37.9* 39.2  MCV 90.5 89.3  PLT 225 214   Cardiac Enzymes: No results for input(s): CKTOTAL, CKMB, CKMBINDEX, TROPONINI in the last 168 hours. BNP: Invalid input(s): POCBNP CBG: No results for input(s): GLUCAP in the last 168 hours. D-Dimer No results for input(s): DDIMER in the last 72 hours. Hgb A1c No results for input(s): HGBA1C in the last 72 hours. Lipid Profile No results for input(s): CHOL, HDL, LDLCALC, TRIG, CHOLHDL, LDLDIRECT in the last 72 hours. Thyroid function studies No results for input(s): TSH, T4TOTAL, T3FREE, THYROIDAB  in the last 72 hours.  Invalid input(s): FREET3 Anemia work up No results for input(s): VITAMINB12, FOLATE, FERRITIN, TIBC, IRON,  RETICCTPCT in the last 72 hours. Urinalysis    Component Value Date/Time   COLORURINE YELLOW (A) 08/09/2020 1637   APPEARANCEUR CLEAR (A) 08/09/2020 1637   APPEARANCEUR Clear 11/22/2015 0950   LABSPEC 1.012 08/09/2020 1637   PHURINE 6.0 08/09/2020 1637   GLUCOSEU NEGATIVE 08/09/2020 1637   HGBUR SMALL (A) 08/09/2020 1637   BILIRUBINUR NEGATIVE 08/09/2020 1637   BILIRUBINUR Negative 11/22/2015 0950   KETONESUR NEGATIVE 08/09/2020 1637   PROTEINUR 30 (A) 08/09/2020 1637   UROBILINOGEN 1.0 02/16/2010 1336   NITRITE NEGATIVE 08/09/2020 1637   LEUKOCYTESUR NEGATIVE 08/09/2020 1637   Sepsis Labs Invalid input(s): PROCALCITONIN,  WBC,  LACTICIDVEN Microbiology Recent Results (from the past 240 hour(s))  Resp Panel by RT-PCR (Flu A&B, Covid) Nasopharyngeal Swab     Status: None   Collection Time: 09/06/20 11:42 PM   Specimen: Nasopharyngeal Swab; Nasopharyngeal(NP) swabs in vial transport medium  Result Value Ref Range Status   SARS Coronavirus 2 by RT PCR NEGATIVE NEGATIVE Final    Comment: (NOTE) SARS-CoV-2 target nucleic acids are NOT DETECTED.  The SARS-CoV-2 RNA is generally detectable in upper respiratory specimens during the acute phase of infection. The lowest concentration of SARS-CoV-2 viral copies this assay can detect is 138 copies/mL. A negative result does not preclude SARS-Cov-2 infection and should not be used as the sole basis for treatment or other patient management decisions. A negative result may occur with  improper specimen collection/handling, submission of specimen other than nasopharyngeal swab, presence of viral mutation(s) within the areas targeted by this assay, and inadequate number of viral copies(<138 copies/mL). A negative result must be combined with clinical observations, patient history, and epidemiological information. The expected result is Negative.  Fact Sheet for Patients:  EntrepreneurPulse.com.au  Fact Sheet for  Healthcare Providers:  IncredibleEmployment.be  This test is no t yet approved or cleared by the [place] FDA and  has been authorized for detection and/or diagnosis of SARS-CoV-2 by FDA under an Emergency Use Authorization (EUA). This EUA will remain  in effect (meaning this test can be used) for the duration of the COVID-19 declaration under Section 564(b)(1) of the Act, 21 U.S.C.section 360bbb-3(b)(1), unless the authorization is terminated  or revoked sooner.       Influenza A by PCR NEGATIVE NEGATIVE Final   Influenza B by PCR NEGATIVE NEGATIVE Final    Comment: (NOTE) The Xpert Xpress SARS-CoV-2/FLU/RSV plus assay is intended as an aid in the diagnosis of influenza from Nasopharyngeal swab specimens and should not be used as a sole basis for treatment. Nasal washings and aspirates are unacceptable for Xpert Xpress SARS-CoV-2/FLU/RSV testing.  Fact Sheet for Patients: EntrepreneurPulse.com.au  Fact Sheet for Healthcare Providers: IncredibleEmployment.be  This test is not yet approved or cleared by the [place] FDA and has been authorized for detection and/or diagnosis of SARS-CoV-2 by FDA under an Emergency Use Authorization (EUA). This EUA will remain in effect (meaning this test can be used) for the duration of the COVID-19 declaration under Section 564(b)(1) of the Act, 21 U.S.C. section 360bbb-3(b)(1), unless the authorization is terminated or revoked.  Performed at [facility], [address] [place], [place] [address]   Blood culture (routine x 2)     Status: None (Preliminary result)   Collection Time: 09/06/20 11:42 PM   Specimen: BLOOD  Result Value Ref Range Status   Specimen  Description BLOOD RIGHT ASSIST CONTROL  Final   Special Requests   Final    BOTTLES DRAWN AEROBIC AND ANAEROBIC Blood Culture adequate volume   Culture   Final    NO GROWTH 3 DAYS Performed at [facility], [address], [place], [place] [address]    Report Status PENDING  Incomplete  Blood culture (routine x 2)     Status: None (Preliminary result)   Collection Time: 09/06/20 11:42 PM   Specimen: BLOOD  Result Value Ref Range Status   Specimen Description BLOOD LEFT ASSIST CONTROL  Final   Special Requests   Final    BOTTLES DRAWN AEROBIC AND ANAEROBIC Blood Culture results may not be optimal due to an excessive volume of blood received in culture bottles   Culture   Final    NO GROWTH 3 DAYS Performed at [facility], [address] [place], [place] [address]    Report Status PENDING  Incomplete  Body fluid culture     Status: None (Preliminary result)   Collection Time: 09/08/20 10:44 AM   Specimen: PATH Cytology Pleural fluid  Result Value Ref Range Status   Specimen Description   Final    PLEURAL Performed at [facility], [address] [place], [place] [address]    Special Requests   Final    NONE Performed at [facility], [address] [place], [place] [address]    Gram Stain   Final    RARE WBC PRESENT,BOTH PMN AND MONONUCLEAR NO ORGANISMS SEEN    Culture   Final    NO GROWTH 2 DAYS Performed at [facility], [place] [address] [place], [place] [address]    Report Status PENDING  Incomplete    Time coordinating discharge: Over 30 minutes  SIGNED:  Lorella Nimrod, MD  [facility] 09/10/2020, 9:59 AM  If 7PM-7AM, please contact night-coverage [email or web address]  This record has been created using Systems analyst. Errors have been sought and corrected,but may not always be located. Such creation errors do not reflect on the standard of care.

## 2020-09-11 LAB — BODY FLUID CULTURE: Culture: NO GROWTH

## 2020-09-12 LAB — CYTOLOGY - NON PAP

## 2020-09-12 LAB — CULTURE, BLOOD (ROUTINE X 2)
Culture: NO GROWTH
Culture: NO GROWTH
Special Requests: ADEQUATE

## 2020-09-12 LAB — PH, BODY FLUID: pH, Body Fluid: 7.7

## 2020-09-13 DIAGNOSIS — I10 Essential (primary) hypertension: Secondary | ICD-10-CM | POA: Diagnosis not present

## 2020-09-13 DIAGNOSIS — I5021 Acute systolic (congestive) heart failure: Secondary | ICD-10-CM | POA: Diagnosis not present

## 2020-09-13 DIAGNOSIS — I350 Nonrheumatic aortic (valve) stenosis: Secondary | ICD-10-CM | POA: Diagnosis not present

## 2020-09-13 DIAGNOSIS — R778 Other specified abnormalities of plasma proteins: Secondary | ICD-10-CM | POA: Diagnosis not present

## 2020-09-14 ENCOUNTER — Other Ambulatory Visit: Payer: Self-pay

## 2020-09-14 ENCOUNTER — Encounter: Payer: PPO | Admitting: Internal Medicine

## 2020-09-14 DIAGNOSIS — I5022 Chronic systolic (congestive) heart failure: Secondary | ICD-10-CM | POA: Diagnosis not present

## 2020-09-14 DIAGNOSIS — L89312 Pressure ulcer of right buttock, stage 2: Secondary | ICD-10-CM | POA: Diagnosis not present

## 2020-09-14 DIAGNOSIS — I255 Ischemic cardiomyopathy: Secondary | ICD-10-CM | POA: Diagnosis not present

## 2020-09-14 DIAGNOSIS — I130 Hypertensive heart and chronic kidney disease with heart failure and stage 1 through stage 4 chronic kidney disease, or unspecified chronic kidney disease: Secondary | ICD-10-CM | POA: Diagnosis not present

## 2020-09-14 DIAGNOSIS — L89322 Pressure ulcer of left buttock, stage 2: Secondary | ICD-10-CM | POA: Diagnosis not present

## 2020-09-14 DIAGNOSIS — I2510 Atherosclerotic heart disease of native coronary artery without angina pectoris: Secondary | ICD-10-CM | POA: Diagnosis not present

## 2020-09-14 DIAGNOSIS — N183 Chronic kidney disease, stage 3 unspecified: Secondary | ICD-10-CM | POA: Diagnosis not present

## 2020-09-14 LAB — CHOLESTEROL, BODY FLUID: Cholesterol, Fluid: 48 mg/dL

## 2020-09-15 DIAGNOSIS — E871 Hypo-osmolality and hyponatremia: Secondary | ICD-10-CM | POA: Diagnosis not present

## 2020-09-15 DIAGNOSIS — N184 Chronic kidney disease, stage 4 (severe): Secondary | ICD-10-CM | POA: Diagnosis not present

## 2020-09-15 DIAGNOSIS — R809 Proteinuria, unspecified: Secondary | ICD-10-CM | POA: Diagnosis not present

## 2020-09-15 DIAGNOSIS — D631 Anemia in chronic kidney disease: Secondary | ICD-10-CM | POA: Diagnosis not present

## 2020-09-15 DIAGNOSIS — I129 Hypertensive chronic kidney disease with stage 1 through stage 4 chronic kidney disease, or unspecified chronic kidney disease: Secondary | ICD-10-CM | POA: Diagnosis not present

## 2020-09-15 NOTE — Progress Notes (Signed)
James, Holt ([id number]) Visit Report for 09/14/2020 HPI Details Patient Name: James, Holt. Date of Service: 09/14/2020 2:30 PM Medical Record Number: [id number] Patient Account Number: [id number] Date of Birth/Sex: [birth year] (86 y.o. M) Treating RN: Army Melia Primary Care Provider: Harrel Lemon Other Clinician: Referring Provider: Harrel Lemon Treating Provider/Extender: Tito Dine in Treatment: 2 History of Present Illness HPI Description: ADMISSION 08/31/2020 This is an 86 year old independent man who lives at home on his own. His problem started in August when he fell and fractured his right femur. He required an ORIF. He was sent to a rehab facility. At some point in this timeframe he developed bilateral pressure ulcers on his buttock. He was seen by dermatology Dr. Nehemiah Massed on 06/27/2020 noting the pressure injuries of his bilateral buttocks at the time treatment order for salicylic acid 3% ointment. Saw his primary doctor in the same timeframe. He is currently using a compounded ointment to the area including iodoquinol 1% / 0.25% hydrocortisone/2% niacinamide. I am not really sure how this is been progressing. The wounds are stage II on the bilateral buttocks surrounding the coccyx area. The patient has been changing his dressing himself. He is a very active man in fact had another fall in November suffering a periprosthetic hip fracture requiring additional pinning. He says actually after the second surgery he improved and he no longer has as much pain. The patient is here with his daughter. He also has a son that lives nearby. Past medical history includes systolic congestive heart failure, ischemic cardiomyopathy, right femur fracture and subsequent periprosthetic fracture as described, chronic kidney disease stage III, MGUS and abdominal aortic aneurysm 12/29; since the patient was last here he was apparently admitted with congestive heart failure. He  was not given any wound care supplies he was discharged on Christmas day he apparently called here to get [name] to some orders and more of the collagen and apparently we did so but none never arrived. His son has been applying topical antibiotics. There is not too much change in the wound area on either side Electronic Signature(s) Signed: 09/14/2020 6:56:58 PM By: Linton Ham MD Entered By: Linton Ham on 09/14/2020 15:40:07 James, Durenda Holt ([id number]) -------------------------------------------------------------------------------- Physical Exam Details Patient Name: James Prude T. Date of Service: 09/14/2020 2:30 PM Medical Record Number: [id number] Patient Account Number: [id number] Date of Birth/Sex: 1930-11-16 (86 y.o. M) Treating RN: Army Melia Primary Care Provider: Harrel Lemon Other Clinician: Referring Provider: Harrel Lemon Treating Provider/Extender: Tito Dine in Treatment: 2 Constitutional Sitting or standing Blood Pressure is within target range for patient.. Pulse regular and within target range for patient.Marland Kitchen Respirations regular, non- labored and within target range.. Temperature is normal and within the target range for the patient.Marland Kitchen appears in no distress. Notes Wound exam; the patient has mirror-image wounds around the coccyx and his bilateral buttocks. These are stage II. There is 2 areas on the right and 1 on the left. All of them look the same very dry minimal depth. No evidence of surrounding infection. Electronic Signature(s) Signed: 09/14/2020 6:56:58 PM By: Linton Ham MD Entered By: Linton Ham on 09/14/2020 15:41:05 [name] ([id number]) -------------------------------------------------------------------------------- Physician Orders Details Patient Name: James Prude T. Date of Service: 09/14/2020 2:30 PM Medical Record Number: [id number] Patient Account Number: [id number] Date of Birth/Sex: [birth year] (86 y.o.  M) Treating RN: Carlene Coria Primary Care Provider: Harrel Lemon Other Clinician: Referring Provider: Harrel Lemon Treating Provider/Extender: Tito Dine in Treatment: 2 Verbal /  Phone Orders: No Diagnosis Coding ICD-10 Coding Code Description S01.093 Pressure ulcer of right buttock, stage 2 L89.322 Pressure ulcer of left buttock, stage 2 Follow-up Appointments o Return Appointment in 2 weeks. Wound Cleansing/Bathing/Shower/Hygiene o May shower without dressing. Gently cleanse wound with antibacterial soap, rinse and pat dry prior to dressing wounds Primary Wound Dressing o Collagen - moisten with normal saline or KY jelly Secondary Dressing Wound #1 Left Gluteus o Other - foam border dressing Wound #2 Right Gluteus o Other - foam border dressing Dressing Change Frequency o Change dressing every day. Electronic Signature(s) Signed: 09/14/2020 6:56:58 PM By: Linton Ham MD Signed: 09/15/2020 12:51:50 PM By: Carlene Coria RN Entered By: Carlene Coria on 09/14/2020 15:11:27 James, Durenda Holt ([id number]) -------------------------------------------------------------------------------- Problem List Details Patient Name: James Prude T. Date of Service: 09/14/2020 2:30 PM Medical Record Number: [id number] Patient Account Number: [id number] Date of Birth/Sex: October 23, 1930 (86 y.o. M) Treating RN: Carlene Coria Primary Care Provider: Harrel Lemon Other Clinician: Referring Provider: Harrel Lemon Treating Provider/Extender: Tito Dine in Treatment: 2 Active Problems ICD-10 Encounter Code Description Active Date MDM Diagnosis L89.312 Pressure ulcer of right buttock, stage 2 08/31/2020 No Yes L89.322 Pressure ulcer of left buttock, stage 2 08/31/2020 No Yes Inactive Problems Resolved Problems Electronic Signature(s) Signed: 09/14/2020 6:56:58 PM By: Linton Ham MD Entered By: Linton Ham on 09/14/2020 15:39:11 Monds,  Durenda Holt ([id number]) -------------------------------------------------------------------------------- Progress Note Details Patient Name: James Prude T. Date of Service: 09/14/2020 2:30 PM Medical Record Number: [id number] Patient Account Number: [id number] Date of Birth/Sex: Feb 05, 1931 (86 y.o. M) Treating RN: Army Melia Primary Care Provider: Harrel Lemon Other Clinician: Referring Provider: Harrel Lemon Treating Provider/Extender: Tito Dine in Treatment: 2 Subjective History of Present Illness (HPI) ADMISSION 08/31/2020 This is an 86 year old independent man who lives at home on his own. His problem started in August when he fell and fractured his right femur. He required an ORIF. He was sent to a rehab facility. At some point in this timeframe he developed bilateral pressure ulcers on his buttock. He was seen by dermatology Dr. Nehemiah Massed on 06/27/2020 noting the pressure injuries of his bilateral buttocks at the time treatment order for salicylic acid 3% ointment. Saw his primary doctor in the same timeframe. He is currently using a compounded ointment to the area including iodoquinol 1% / 0.25% hydrocortisone/2% niacinamide. I am not really sure how this is been progressing. The wounds are stage II on the bilateral buttocks surrounding the coccyx area. The patient has been changing his dressing himself. He is a very active man in fact had another fall in November suffering a periprosthetic hip fracture requiring additional pinning. He says actually after the second surgery he improved and he no longer has as much pain. The patient is here with his daughter. He also has a son that lives nearby. Past medical history includes systolic congestive heart failure, ischemic cardiomyopathy, right femur fracture and subsequent periprosthetic fracture as described, chronic kidney disease stage III, MGUS and abdominal aortic aneurysm 12/29; since the patient was last here he  was apparently admitted with congestive heart failure. He was not given any wound care supplies he was discharged on Christmas day he apparently called here to get [name] to some orders and more of the collagen and apparently we did so but none never arrived. His son has been applying topical antibiotics. There is not too much change in the wound area on either side Objective Constitutional Sitting or standing Blood Pressure is within target  range for patient.. Pulse regular and within target range for patient.Marland Kitchen Respirations regular, non- labored and within target range.. Temperature is normal and within the target range for the patient.Marland Kitchen appears in no distress. Vitals Time Taken: 2:34 PM, Height: 71 in, Weight: 164 lbs, BMI: 22.9, Temperature: 97.8 F, Pulse: 85 bpm, Respiratory Rate: 18 breaths/min, Blood Pressure: 122/73 mmHg. General Notes: Wound exam; the patient has mirror-image wounds around the coccyx and his bilateral buttocks. These are stage II. There is 2 areas on the right and 1 on the left. All of them look the same very dry minimal depth. No evidence of surrounding infection. Integumentary (Hair, Skin) Wound #1 status is Open. Original cause of wound was Pressure Injury. The wound is located on the Left Gluteus. The wound measures 2.4cm length x 2cm width x 0.2cm depth; 3.77cm^2 area and 0.754cm^3 volume. There is Fat Layer (Subcutaneous Tissue) exposed. There is no tunneling or undermining noted. There is a small amount of serous drainage noted. There is large (67-100%) red, pink granulation within the wound bed. There is no necrotic tissue within the wound bed. Wound #2 status is Open. Original cause of wound was Pressure Injury. The wound is located on the Right Gluteus. The wound measures 1.2cm length x 0.4cm width x 0.2cm depth; 0.377cm^2 area and 0.075cm^3 volume. There is Fat Layer (Subcutaneous Tissue) exposed. There is a small amount of serous drainage noted. There is large  (67-100%) red, pink granulation within the wound bed. There is no necrotic tissue within the wound bed. Assessment Active Problems ICD-10 Pressure ulcer of right buttock, stage 2 Pressure ulcer of left buttock, stage 2 Lippard, Dameer T. ([id number]) Plan Follow-up Appointments: Return Appointment in 2 weeks. Wound Cleansing/Bathing/Shower/Hygiene: May shower without dressing. Gently cleanse wound with antibacterial soap, rinse and pat dry prior to dressing wounds Primary Wound Dressing: Collagen - moisten with normal saline or KY jelly Secondary Dressing: Wound #1 Left Gluteus: Other - foam border dressing Wound #2 Right Gluteus: Other - foam border dressing Dressing Change Frequency: Change dressing every day. #1 we are going to reapply this moistened silver collagen with border foam 2. Change this every second day 3. He apparently has not been putting anything meaningful on this in the last 5 days and it is not really clear what they put on it in the hospital. 4. We emphasized pressure relief and he says that he does not have any problem with this at night however during the day he finds it difficult to maneuver this area when he is sitting. He does have a fancy lift chair which should allow him to put his weight forward into his lower thighs and ischial tuberosities and we talked about this 5. We will see him back in 2 weeks. Electronic Signature(s) Signed: 09/14/2020 6:56:58 PM By: Linton Ham MD Entered By: Linton Ham on 09/14/2020 15:43:15 Encalada, Durenda Holt ([id number]) -------------------------------------------------------------------------------- [place] Details Patient Name: James Prude T. Date of Service: 09/14/2020 Medical Record Number: [id number] Patient Account Number: [id number] Date of Birth/Sex: Apr 28, 1931 (86 y.o. M) Treating RN: Carlene Coria Primary Care Provider: Harrel Lemon Other Clinician: Referring Provider: Harrel Lemon Treating  Provider/Extender: Tito Dine in Treatment: 2 Diagnosis Coding ICD-10 Codes Code Description [phone] Pressure ulcer of right buttock, stage 2 L89.322 Pressure ulcer of left buttock, stage 2 Facility Procedures CPT4 Code: [id number] Description: 99213 - WOUND CARE VISIT-LEV 3 EST PT Modifier: Quantity: 1 Physician Procedures CPT4 Code: [id number] Description: 38756 - WC PHYS LEVEL 3 - EST PT  Modifier: Quantity: 1 CPT4 Code: Description: ICD-10 Diagnosis Description Y48.250 Pressure ulcer of right buttock, stage 2 L89.322 Pressure ulcer of left buttock, stage 2 Modifier: Quantity: Electronic Signature(s) Signed: 09/14/2020 6:56:58 PM By: Linton Ham MD Entered By: Linton Ham on 09/14/2020 15:42:18

## 2020-09-15 NOTE — Progress Notes (Signed)
DEEN, DEGUIA ([id number]) Visit Report for 09/14/2020 Arrival Information Details Patient Name: [name], [name]. Date of Service: 09/14/2020 2:30 PM Medical Record Number: [id number] Patient Account Number: [id number] Date of Birth/Sex: [birth year] (86 y.o. M) Treating RN: [name] Primary Care Ceclia Koker: Harrel Lemon Other Clinician: Referring Denaja Verhoeven: Harrel Lemon Treating Omolara Carol/Extender: [name] in Treatment: 2 Visit Information History Since Last Visit Pain Present Now: Yes Patient Arrived: Walker Arrival Time: 14:33 Accompanied By: daughter in law Transfer Assistance: Manual Patient Identification Verified: Yes Secondary Verification Process Completed: Yes Electronic Signature(s) Signed: 09/14/2020 4:45:04 PM By: Georges Mouse, Minus Breeding RN Entered By: Georges Mouse, Minus Breeding on 09/14/2020 14:33:59 Zentner, [name] ([id number]) -------------------------------------------------------------------------------- Clinic Level of Care Assessment Details Patient Name: James Prude T. Date of Service: 09/14/2020 2:30 PM Medical Record Number: [id number] Patient Account Number: [id number] Date of Birth/Sex: 1931-05-26 (86 y.o. M) Treating RN: Carlene Coria Primary Care Celia Friedland: Harrel Lemon Other Clinician: Referring Michaelyn Wall: Harrel Lemon Treating Danthony Kendrix/Extender: [name] in Treatment: 2 Clinic Level of Care Assessment Items TOOL 4 Quantity Score X - Use when only an EandM is performed on FOLLOW-UP visit 1 0 ASSESSMENTS - Nursing Assessment / Reassessment X - Reassessment of Co-morbidities (includes updates in patient status) 1 10 X- 1 5 Reassessment of Adherence to Treatment Plan ASSESSMENTS - Wound and Skin Assessment / Reassessment []  - Simple Wound Assessment / Reassessment - one wound 0 X- 2 5 Complex Wound Assessment / Reassessment - multiple wounds []  - 0 Dermatologic / Skin Assessment (not related to wound  area) ASSESSMENTS - Focused Assessment []  - Circumferential Edema Measurements - multi extremities 0 []  - 0 Nutritional Assessment / Counseling / Intervention []  - 0 Lower Extremity Assessment (monofilament, tuning fork, pulses) []  - 0 Peripheral Arterial Disease Assessment (using hand held doppler) ASSESSMENTS - Ostomy and/or Continence Assessment and Care []  - Incontinence Assessment and Management 0 []  - 0 Ostomy Care Assessment and Management (repouching, etc.) PROCESS - Coordination of Care X - Simple Patient / Family Education for ongoing care 1 15 []  - 0 Complex (extensive) Patient / Family Education for ongoing care []  - 0 Staff obtains Programmer, systems, Records, Test Results / Process Orders []  - 0 Staff telephones HHA, Nursing Homes / Clarify orders / etc []  - 0 Routine Transfer to another Facility (non-emergent condition) []  - 0 Routine Hospital Admission (non-emergent condition) []  - 0 New Admissions / Biomedical engineer / Ordering NPWT, Apligraf, etc. []  - 0 Emergency Hospital Admission (emergent condition) X- 1 10 Simple Discharge Coordination []  - 0 Complex (extensive) Discharge Coordination PROCESS - Special Needs []  - Pediatric / Minor Patient Management 0 []  - 0 Isolation Patient Management []  - 0 Hearing / Language / Visual special needs []  - 0 Assessment of Community assistance (transportation, D/C planning, etc.) []  - 0 Additional assistance / Altered mentation []  - 0 Support Surface(s) Assessment (bed, cushion, seat, etc.) INTERVENTIONS - Wound Cleansing / Measurement Kasler, James T. ([id number]) []  - 0 Simple Wound Cleansing - one wound X- 2 5 Complex Wound Cleansing - multiple wounds X- 1 5 Wound Imaging (photographs - any number of wounds) []  - 0 Wound Tracing (instead of photographs) []  - 0 Simple Wound Measurement - one wound X- 2 5 Complex Wound Measurement - multiple wounds INTERVENTIONS - Wound Dressings []  - Small Wound  Dressing one or multiple wounds 0 X- 1 15 Medium Wound Dressing one or multiple wounds []  - 0 Large Wound Dressing one or multiple wounds X-  1 5 Application of Medications - topical []  - 0 Application of Medications - injection INTERVENTIONS - Miscellaneous []  - External ear exam 0 []  - 0 Specimen Collection (cultures, biopsies, blood, body fluids, etc.) []  - 0 Specimen(s) / Culture(s) sent or taken to Lab for analysis []  - 0 Patient Transfer (multiple staff / Civil Service fast streamer / Similar devices) []  - 0 Simple Staple / Suture removal (25 or less) []  - 0 Complex Staple / Suture removal (26 or more) []  - 0 Hypo / Hyperglycemic Management (close monitor of Blood Glucose) []  - 0 Ankle / Brachial Index (ABI) - do not check if billed separately X- 1 5 Vital Signs Has the patient been seen at the hospital within the last three years: Yes Total Score: 100 Level Of Care: New/Established - Level 3 Electronic Signature(s) Signed: 09/15/2020 12:51:50 PM By: Carlene Coria RN Entered By: Carlene Coria on 09/14/2020 15:12:12 Greaser, [name] ([id number]) -------------------------------------------------------------------------------- Encounter Discharge Information Details Patient Name: James Prude T. Date of Service: 09/14/2020 2:30 PM Medical Record Number: [id number] Patient Account Number: [id number] Date of Birth/Sex: July 26, 1931 (86 y.o. M) Treating RN: Carlene Coria Primary Care Josafat Enrico: Harrel Lemon Other Clinician: Referring Angelee Bahr: Harrel Lemon Treating Tashona Calk/Extender: [name] in Treatment: 2 Encounter Discharge Information Items Discharge Condition: Stable Ambulatory Status: Walker Discharge Destination: Home Transportation: Private Auto Accompanied By: daughter inlaw Schedule Follow-up Appointment: Yes Clinical Summary of Care: Patient Declined Electronic Signature(s) Signed: 09/15/2020 12:51:50 PM By: Carlene Coria RN Entered By: Carlene Coria on  09/14/2020 15:23:31 Nanni, [name] ([id number]) -------------------------------------------------------------------------------- Multi Wound Chart Details Patient Name: James Prude T. Date of Service: 09/14/2020 2:30 PM Medical Record Number: [id number] Patient Account Number: [id number] Date of Birth/Sex: 19-Sep-1930 (86 y.o. M) Treating RN: Carlene Coria Primary Care Keiston Manley: Harrel Lemon Other Clinician: Referring Kenji Mapel: Harrel Lemon Treating [name]/Extender: [name] in Treatment: 2 Vital Signs Height(in): 71 Pulse(bpm): 61 Weight(lbs): 164 Blood Pressure(mmHg): 122/73 Body Mass Index(BMI): 23 Temperature(F): 97.8 Respiratory Rate(breaths/min): 18 Photos: [N/A:N/A] Wound Location: Left Gluteus Right Gluteus N/A Wounding Event: Pressure Injury Pressure Injury N/A Primary Etiology: Pressure Ulcer Pressure Ulcer N/A Comorbid History: Coronary Artery Disease, Coronary Artery Disease, N/A Hypertension, History of pressure Hypertension, History of pressure wounds wounds Date Acquired: 05/18/2020 05/18/2020 N/A Weeks of Treatment: 2 2 N/A Wound Status: Open Open N/A Clustered Wound: Yes No N/A Clustered Quantity: 3 N/A N/A Measurements L x W x D (cm) 2.4x2x0.2 1.2x0.4x0.2 N/A Area (cm) : 3.77 0.377 N/A Volume (cm) : 0.754 0.075 N/A % Reduction in Area: 12.70% 4.10% N/A % Reduction in Volume: -74.50% -92.30% N/A Classification: Category/Stage II Category/Stage II N/A Exudate Amount: Small Small N/A Exudate Type: Serous Serous N/A Exudate Color: amber amber N/A Granulation Amount: Large (67-100%) Large (67-100%) N/A Granulation Quality: Red, Pink Red, Pink N/A Necrotic Amount: None Present (0%) None Present (0%) N/A Exposed Structures: Fat Layer (Subcutaneous Tissue): Fat Layer (Subcutaneous Tissue): N/A Yes Yes Fascia: No Fascia: No Tendon: No Tendon: No Muscle: No Muscle: No Joint: No Joint: No Bone: No Bone: No Epithelialization: None  None N/A Treatment Notes Wound #1 (Left Gluteus) 1. Cleansed with: Clean wound with Normal Saline Notes prisma moisten with normal saline, self adhesive foam boarder Grimmer, James T. ([id number]) Wound #2 (Right Gluteus) 1. Cleansed with: Clean wound with Normal Saline Notes prisma moisten with normal saline, self adhesive foam boarder Electronic Signature(s) Signed: 09/14/2020 6:56:58 PM By: Linton Ham MD Entered By: Linton Ham on 09/14/2020 15:39:18 Quinto, [name] ([id number]) --------------------------------------------------------------------------------  Multi-Disciplinary Care Plan Details Patient Name: ADVAY, VOLANTE. Date of Service: 09/14/2020 2:30 PM Medical Record Number: [id number] Patient Account Number: [id number] Date of Birth/Sex: [birth year] (86 y.o. M) Treating RN: Carlene Coria Primary Care Bailie Christenbury: Harrel Lemon Other Clinician: Referring Sheridan Hew: Harrel Lemon Treating [name]/Extender: [name] in Treatment: 2 Active Inactive Abuse / Safety / Falls / Self Care Management Nursing Diagnoses: History of Falls Goals: Patient/caregiver will verbalize understanding of skin care regimen Date Initiated: 08/31/2020 Target Resolution Date: 10/02/2019 Goal Status: Active Interventions: Assess fall risk on admission and as needed Notes: Orientation to the Wound Care Program Nursing Diagnoses: Knowledge deficit related to the wound healing center program Goals: Patient/caregiver will verbalize understanding of the [facility] Program Date Initiated: 08/31/2020 Target Resolution Date: 10/01/2020 Goal Status: Active Interventions: Provide education on orientation to the wound center Notes: Wound/Skin Impairment Nursing Diagnoses: Knowledge deficit related to ulceration/compromised skin integrity Goals: Patient/caregiver will verbalize understanding of skin care regimen Date Initiated: 08/31/2020 Target Resolution  Date: 10/01/2020 Goal Status: Active Ulcer/skin breakdown will have a volume reduction of 30% by week 4 Date Initiated: 08/31/2020 Target Resolution Date: 10/01/2020 Goal Status: Active Interventions: Assess ulceration(s) every visit Treatment Activities: Skin care regimen initiated : 08/31/2020 Notes: Electronic Signature(s) Signed: 09/15/2020 12:51:50 PM By: Carlene Coria RN Entered By: Carlene Coria on 09/14/2020 15:07:07 Kapuscinski, [name] ([id number]) Delcarlo, [name] ([id number]) -------------------------------------------------------------------------------- Pain Assessment Details Patient Name: James Prude T. Date of Service: 09/14/2020 2:30 PM Medical Record Number: [id number] Patient Account Number: [id number] Date of Birth/Sex: 1931-01-21 (86 y.o. M) Treating RN: [name] Primary Care Makira Holleman: Harrel Lemon Other Clinician: Referring Seleta Hovland: Harrel Lemon Treating [name]/Extender: [name] in Treatment: 2 Active Problems Location of Pain Severity and Description of Pain Patient Has Paino Yes Site Locations Pain Location: Pain in Ulcers Rate the pain. Current Pain Level: 8 Pain Management and Medication Current Pain Management: Electronic Signature(s) Signed: 09/14/2020 4:45:04 PM By: Georges Mouse, Minus Breeding RN Entered By: Georges Mouse, Kenia on 09/14/2020 14:34:41 Brocks, [name] ([id number]) -------------------------------------------------------------------------------- Patient/Caregiver Education Details Patient Name: James Prude T. Date of Service: 09/14/2020 2:30 PM Medical Record Number: [id number] Patient Account Number: [id number] Date of Birth/Gender: [birth year] (86 y.o. M) Treating RN: Carlene Coria Primary Care Physician: Harrel Lemon Other Clinician: Referring Physician: Harrel Lemon Treating Physician/Extender: [name] in Treatment: 2 Education Assessment Education Provided  To: Patient Education Topics Provided Wound/Skin Impairment: Methods: Explain/Verbal Responses: State content correctly Electronic Signature(s) Signed: 09/15/2020 12:51:50 PM By: Carlene Coria RN Entered By: Carlene Coria on 09/14/2020 15:12:33 Hults, [name] ([id number]) -------------------------------------------------------------------------------- Wound Assessment Details Patient Name: James Prude T. Date of Service: 09/14/2020 2:30 PM Medical Record Number: [id number] Patient Account Number: [id number] Date of Birth/Sex: June 25, 1931 (86 y.o. M) Treating RN: [name] Primary Care Jeremaine Maraj: Harrel Lemon Other Clinician: Referring Jowell Bossi: Harrel Lemon Treating [name]/Extender: [name] in Treatment: 2 Wound Status Wound Number: 1 Primary Pressure Ulcer Etiology: Wound Location: Left Gluteus Wound Status: Open Wounding Event: Pressure Injury Comorbid Coronary Artery Disease, Hypertension, History of Date Acquired: 05/18/2020 History: pressure wounds Weeks Of Treatment: 2 Clustered Wound: Yes Photos Wound Measurements Length: (cm) 2.4 Width: (cm) 2 Depth: (cm) 0.2 Clustered Quantity: 3 Area: (cm) 3.77 Volume: (cm) 0.754 % Reduction in Area: 12.7% % Reduction in Volume: -74.5% Epithelialization: None Tunneling: No Undermining: No Wound Description Classification: Category/Stage II Exudate Amount: Small Exudate Type: Serous Exudate Color: amber Foul Odor After Cleansing: No Slough/Fibrino No Wound Bed Granulation Amount:  Large (67-100%) Exposed Structure Granulation Quality: Red, Pink Fascia Exposed: No Necrotic Amount: None Present (0%) Fat Layer (Subcutaneous Tissue) Exposed: Yes Tendon Exposed: No Muscle Exposed: No Joint Exposed: No Bone Exposed: No Treatment Notes Wound #1 (Left Gluteus) 1. Cleansed with: Clean wound with Normal Saline Notes prisma moisten with normal saline, self adhesive foam boarder [name], [name] ([id number]) Electronic Signature(s) Signed: 09/14/2020 4:45:04 PM By: Georges Mouse, Minus Breeding RN Signed: 09/15/2020 12:51:50 PM By: Carlene Coria RN Entered By: Carlene Coria on 09/14/2020 14:51:53 Adkison, [name] ([id number]) -------------------------------------------------------------------------------- Wound Assessment Details Patient Name: James Prude T. Date of Service: 09/14/2020 2:30 PM Medical Record Number: [id number] Patient Account Number: [id number] Date of Birth/Sex: 1930/12/07 (86 y.o. M) Treating RN: [name] Primary Care Evens Meno: Harrel Lemon Other Clinician: Referring Jeannene Tschetter: Harrel Lemon Treating [name]/Extender: [name] in Treatment: 2 Wound Status Wound Number: 2 Primary Pressure Ulcer Etiology: Wound Location: Right Gluteus Wound Status: Open Wounding Event: Pressure Injury Comorbid Coronary Artery Disease, Hypertension, History of Date Acquired: 05/18/2020 History: pressure wounds Weeks Of Treatment: 2 Clustered Wound: No Photos Wound Measurements Length: (cm) 1.2 Width: (cm) 0.4 Depth: (cm) 0.2 Area: (cm) 0.377 Volume: (cm) 0.075 % Reduction in Area: 4.1% % Reduction in Volume: -92.3% Epithelialization: None Wound Description Classification: Category/Stage II Exudate Amount: Small Exudate Type: Serous Exudate Color: amber Foul Odor After Cleansing: No Slough/Fibrino No Wound Bed Granulation Amount: Large (67-100%) Exposed Structure Granulation Quality: Red, Pink Fascia Exposed: No Necrotic Amount: None Present (0%) Fat Layer (Subcutaneous Tissue) Exposed: Yes Tendon Exposed: No Muscle Exposed: No Joint Exposed: No Bone Exposed: No Treatment Notes Wound #2 (Right Gluteus) 1. Cleansed with: Clean wound with Normal Saline Notes prisma moisten with normal saline, self adhesive foam boarder [name], [name] ([id number]) Electronic Signature(s) Signed: 09/14/2020 4:45:04 PM By: Georges Mouse, Minus Breeding  RN Signed: 09/15/2020 12:51:50 PM By: Carlene Coria RN Entered By: Carlene Coria on 09/14/2020 14:52:37 Altmann, [name] ([id number]) -------------------------------------------------------------------------------- Vitals Details Patient Name: James Prude T. Date of Service: 09/14/2020 2:30 PM Medical Record Number: [id number] Patient Account Number: [id number] Date of Birth/Sex: [birth year] (86 y.o. M) Treating RN: [name] Primary Care Trinity Haun: Harrel Lemon Other Clinician: Referring Itzae Miralles: Harrel Lemon Treating Myrikal Messmer/Extender: [name] in Treatment: 2 Vital Signs Time Taken: 14:34 Temperature (F): 97.8 Height (in): 71 Pulse (bpm): 85 Weight (lbs): 164 Respiratory Rate (breaths/min): 18 Body Mass Index (BMI): 22.9 Blood Pressure (mmHg): 122/73 Reference Range: 80 - 120 mg / dl Electronic Signature(s) Signed: 09/14/2020 4:45:04 PM By: Georges Mouse, Minus Breeding RN Entered By: Georges Mouse, Minus Breeding on 09/14/2020 14:34:22

## 2020-09-19 DIAGNOSIS — L89310 Pressure ulcer of right buttock, unstageable: Secondary | ICD-10-CM | POA: Diagnosis not present

## 2020-09-20 ENCOUNTER — Ambulatory Visit: Payer: PPO | Admitting: Family

## 2020-09-21 DIAGNOSIS — N184 Chronic kidney disease, stage 4 (severe): Secondary | ICD-10-CM | POA: Diagnosis not present

## 2020-09-22 DIAGNOSIS — Z125 Encounter for screening for malignant neoplasm of prostate: Secondary | ICD-10-CM | POA: Diagnosis not present

## 2020-09-22 DIAGNOSIS — R351 Nocturia: Secondary | ICD-10-CM | POA: Diagnosis not present

## 2020-09-22 DIAGNOSIS — N183 Chronic kidney disease, stage 3 unspecified: Secondary | ICD-10-CM | POA: Diagnosis not present

## 2020-09-22 DIAGNOSIS — N1831 Chronic kidney disease, stage 3a: Secondary | ICD-10-CM | POA: Diagnosis not present

## 2020-09-22 DIAGNOSIS — N401 Enlarged prostate with lower urinary tract symptoms: Secondary | ICD-10-CM | POA: Diagnosis not present

## 2020-09-22 DIAGNOSIS — N170 Acute kidney failure with tubular necrosis: Secondary | ICD-10-CM | POA: Diagnosis not present

## 2020-09-22 DIAGNOSIS — J948 Other specified pleural conditions: Secondary | ICD-10-CM | POA: Diagnosis not present

## 2020-09-22 DIAGNOSIS — R0600 Dyspnea, unspecified: Secondary | ICD-10-CM | POA: Diagnosis not present

## 2020-09-24 NOTE — Progress Notes (Deleted)
   Patient ID: [name], male    DOB: 06-15-1931, 87 y.o.   MRN: [id number]  HPI  Mr Nuon is a 87 y/o male with a history of  Echo report from 04/18/20 reviewed and showed an EF of 45% along with LVH, moderate TR and mild/moderate MR.   Admitted 09/06/20 due to shortness of breath. Cardiology, nephrology and pulmonology consults obtained. Could not tolerated IV lasix due to worsening renal function. Thoracentesis completed. Able to be weaned off oxygen. Amlodipine and irbesartan were stopped due to soft BP's. Elevated troponin thought to be due to demand ischemia. Chest CT showed T10 compression deformity. Discharged after 4 days.   He presents today for his initial visit with a chief complaint of   Review of Systems    Physical Exam  Assessment & Plan:  1: Chronic heart failure with mildly reduced ejection fraction with structural changes (LVH)- - NYHA class - saw cardiology Nehemiah Massed) 09/13/20 - saw pulmonology Raul Del) 09/22/20 - BNP 09/06/20 was 3448.5   2: HTN- - BP - saw PCP Edwina Barth) 09/01/20 - BMP 09/10/20 reviewed and showed sodium 138, potassium 3.5, creatinine 2.31 and GFR 27  3: CKD- - saw nephrology Holley Raring) 09/15/20

## 2020-09-26 ENCOUNTER — Telehealth: Payer: Self-pay | Admitting: Family

## 2020-09-26 ENCOUNTER — Ambulatory Visit: Payer: PPO | Admitting: Family

## 2020-09-26 NOTE — Telephone Encounter (Signed)
Patient did not show for his [facility] appointment on 09/26/20. Will attempt to reschedule.

## 2020-09-28 ENCOUNTER — Other Ambulatory Visit: Payer: Self-pay

## 2020-09-28 ENCOUNTER — Encounter: Payer: PPO | Attending: Internal Medicine | Admitting: Internal Medicine

## 2020-09-28 DIAGNOSIS — I5022 Chronic systolic (congestive) heart failure: Secondary | ICD-10-CM | POA: Diagnosis not present

## 2020-09-28 DIAGNOSIS — I255 Ischemic cardiomyopathy: Secondary | ICD-10-CM | POA: Insufficient documentation

## 2020-09-28 DIAGNOSIS — L89312 Pressure ulcer of right buttock, stage 2: Secondary | ICD-10-CM | POA: Diagnosis not present

## 2020-09-28 DIAGNOSIS — I130 Hypertensive heart and chronic kidney disease with heart failure and stage 1 through stage 4 chronic kidney disease, or unspecified chronic kidney disease: Secondary | ICD-10-CM | POA: Insufficient documentation

## 2020-09-28 DIAGNOSIS — N183 Chronic kidney disease, stage 3 unspecified: Secondary | ICD-10-CM | POA: Insufficient documentation

## 2020-09-28 DIAGNOSIS — L89322 Pressure ulcer of left buttock, stage 2: Secondary | ICD-10-CM | POA: Diagnosis not present

## 2020-09-29 NOTE — Progress Notes (Signed)
SUHEYB, RAUCCI ([id number]) Visit Report for 09/28/2020 Debridement Details Patient Name: [name], [name]. Date of Service: 09/28/2020 1:45 PM Medical Record Number: [id number] Patient Account Number: [id number] Date of Birth/Sex: May 13, 1931 (87 y.o. M) Treating RN: Cornell Barman Primary Care Provider: Harrel Lemon Other Clinician: Referring Provider: Harrel Lemon Treating Provider/Extender: Tito Dine in Treatment: 4 Debridement Performed for Wound #1 Left Gluteus Assessment: Performed By: Physician Ricard Dillon, MD Debridement Type: Debridement Level of Consciousness (Pre- Awake and Alert procedure): Pre-procedure Verification/Time Out Yes - 14:00 Taken: Total Area Debrided (L x W): 0.3 (cm) x 0.2 (cm) = 0.06 (cm) Tissue and other material Non-Viable, Eschar debrided: Level: Non-Viable Tissue Debridement Description: Selective/Open Wound Instrument: Curette Bleeding: Minimum Hemostasis Achieved: Pressure Response to Treatment: Procedure was tolerated well Level of Consciousness (Post- Awake and Alert procedure): Post Debridement Measurements of Total Wound Length: (cm) 0.3 Stage: Category/Stage II Width: (cm) 0.2 Depth: (cm) 0.1 Volume: (cm) 0.005 Character of Wound/Ulcer Post Debridement: Stable Post Procedure Diagnosis Same as Pre-procedure Electronic Signature(s) Signed: 09/28/2020 5:50:32 PM By: Gretta Cool, BSN, RN, CWS, Kim RN, BSN Signed: 09/29/2020 12:50:01 PM By: Linton Ham MD Previous Signature: 09/28/2020 5:47:32 PM Version By: Gretta Cool, BSN, RN, CWS, Kim RN, BSN Previous Signature: 09/28/2020 5:18:55 PM Version By: Gretta Cool, BSN, RN, CWS, Kim RN, BSN Entered By: Gretta Cool, BSN, RN, CWS, Kim on 09/28/2020 17:50:32 [name], [name] ([id number]) -------------------------------------------------------------------------------- Debridement Details Patient Name: James Prude T. Date of Service: 09/28/2020 1:45 PM Medical Record Number:  [id number] Patient Account Number: [id number] Date of Birth/Sex: 17-Jul-1931 (87 y.o. M) Treating RN: Cornell Barman Primary Care Provider: Harrel Lemon Other Clinician: Referring Provider: Harrel Lemon Treating Provider/Extender: Tito Dine in Treatment: 4 Debridement Performed for Wound #2 Right Gluteus Assessment: Performed By: Physician Ricard Dillon, MD Debridement Type: Debridement Level of Consciousness (Pre- Awake and Alert procedure): Pre-procedure Verification/Time Out Yes - 14:00 Taken: Total Area Debrided (L x W): 0.7 (cm) x 0.9 (cm) = 0.63 (cm) Tissue and other material Non-Viable, Eschar debrided: Level: Non-Viable Tissue Debridement Description: Selective/Open Wound Instrument: Curette Bleeding: Minimum Hemostasis Achieved: Pressure Response to Treatment: Procedure was tolerated well Level of Consciousness (Post- Awake and Alert procedure): Post Debridement Measurements of Total Wound Length: (cm) 0.7 Stage: Category/Stage II Width: (cm) 0.9 Depth: (cm) 0.1 Volume: (cm) 0.049 Character of Wound/Ulcer Post Debridement: Stable Post Procedure Diagnosis Same as Pre-procedure Electronic Signature(s) Signed: 09/28/2020 5:50:44 PM By: Gretta Cool, BSN, RN, CWS, Kim RN, BSN Signed: 09/29/2020 12:50:01 PM By: Linton Ham MD Previous Signature: 09/28/2020 5:19:30 PM Version By: Gretta Cool, BSN, RN, CWS, Kim RN, BSN Entered By: Gretta Cool, BSN, RN, CWS, Kim on 09/28/2020 17:50:43 [name], [name] ([id number]) -------------------------------------------------------------------------------- HPI Details Patient Name: James Prude T. Date of Service: 09/28/2020 1:45 PM Medical Record Number: [id number] Patient Account Number: [id number] Date of Birth/Sex: 16-Jul-1931 (87 y.o. M) Treating RN: Cornell Barman Primary Care Provider: Harrel Lemon Other Clinician: Referring Provider: Harrel Lemon Treating Provider/Extender: Tito Dine in Treatment:  4 History of Present Illness HPI Description: ADMISSION 08/31/2020 This is an 87 year old independent man who lives at home on his own. His problem started in August when he fell and fractured his right femur. He required an ORIF. He was sent to a rehab facility. At some point in this timeframe he developed bilateral pressure ulcers on his buttock. He was seen by dermatology Dr. Nehemiah Massed on 06/27/2020 noting the pressure injuries of his bilateral buttocks at the time treatment order for salicylic acid 3% ointment.  Saw his primary doctor in the same timeframe. He is currently using a compounded ointment to the area including iodoquinol 1% / 0.25% hydrocortisone/2% niacinamide. I am not really sure how this is been progressing. The wounds are stage II on the bilateral buttocks surrounding the coccyx area. The patient has been changing his dressing himself. He is a very active man in fact had another fall in November suffering a periprosthetic hip fracture requiring additional pinning. He says actually after the second surgery he improved and he no longer has as much pain. The patient is here with his daughter. He also has a son that lives nearby. Past medical history includes systolic congestive heart failure, ischemic cardiomyopathy, right femur fracture and subsequent periprosthetic fracture as described, chronic kidney disease stage III, MGUS and abdominal aortic aneurysm 12/29; since the patient was last here he was apparently admitted with congestive heart failure. He was not given any wound care supplies he was discharged on Christmas day he apparently called here to get [name] to some orders and more of the collagen and apparently we did so but none never arrived. His son has been applying topical antibiotics. There is not too much change in the wound area on either side 09/28/2020; 2 small open areas on the mirror-image of the buttock bilaterally just below the coccyx level. This is in the fold of  the gluteal cleft. Both surface areas are a lot smaller we have been using silver collagen and foam dressing Electronic Signature(s) Signed: 09/29/2020 12:50:01 PM By: Linton Ham MD Entered By: Linton Ham on 09/28/2020 15:07:25 [name], [name] ([id number]) -------------------------------------------------------------------------------- Physical Exam Details Patient Name: James Prude T. Date of Service: 09/28/2020 1:45 PM Medical Record Number: [id number] Patient Account Number: [id number] Date of Birth/Sex: Jul 20, 1931 (87 y.o. M) Treating RN: Cornell Barman Primary Care Provider: Harrel Lemon Other Clinician: Referring Provider: Harrel Lemon Treating Provider/Extender: Tito Dine in Treatment: 4 Notes Wound exam; the patient has 2 mirror-image wounds around the coccyx and his bilateral buttocks mirror-image wounds. Some eschar on the surface was removed. Both of these cleaned up quite nicely they are quite a bit smaller. No evidence of infection Electronic Signature(s) Signed: 09/29/2020 12:50:01 PM By: Linton Ham MD Entered By: Linton Ham on 09/28/2020 15:10:43 [name], [name] ([id number]) -------------------------------------------------------------------------------- Physician Orders Details Patient Name: James Prude T. Date of Service: 09/28/2020 1:45 PM Medical Record Number: [id number] Patient Account Number: [id number] Date of Birth/Sex: Apr 15, 1931 (87 y.o. M) Treating RN: Cornell Barman Primary Care Provider: Harrel Lemon Other Clinician: Referring Provider: Harrel Lemon Treating Provider/Extender: Tito Dine in Treatment: 4 Verbal / Phone Orders: No Diagnosis Coding ICD-10 Coding Code Description I09.735 Pressure ulcer of right buttock, stage 2 L89.322 Pressure ulcer of left buttock, stage 2 Follow-up Appointments o Return Appointment in 2 weeks. Off-Loading o Turn and reposition every 2 hours - keep pressure off  of wounded areas Additional Orders / Instructions o Follow Nutritious Diet and Increase Protein Intake Wound Treatment Wound #1 - Gluteus Wound Laterality: Left Primary Dressing: Prisma 4.34 (in) Discharge Instructions: Moisten w/normal saline or sterile water; Cover wound as directed. Do not remove from wound bed. Secondary Dressing: Coverlet Latex-Free Fabric Adhesive Dressings Discharge Instructions: 1.5 x 2 Wound #2 - Gluteus Wound Laterality: Right Primary Dressing: Prisma 4.34 (in) Discharge Instructions: Moisten w/normal saline or sterile water; Cover wound as directed. Do not remove from wound bed. Secondary Dressing: Coverlet Latex-Free Fabric Adhesive Dressings Discharge Instructions: 1.5 x 2 Electronic Signature(s) Signed: 09/28/2020 5:22:01  PM By: Gretta Cool, BSN, RN, CWS, Kim RN, BSN Signed: 09/29/2020 12:50:01 PM By: Linton Ham MD Entered By: Gretta Cool, BSN, RN, CWS, Kim on 09/28/2020 17:22:01 [name], [name] ([id number]) -------------------------------------------------------------------------------- Problem List Details Patient Name: [name], HEARD. Date of Service: 09/28/2020 1:45 PM Medical Record Number: [id number] Patient Account Number: [id number] Date of Birth/Sex: [birth year] (87 y.o. M) Treating RN: Cornell Barman Primary Care Provider: Harrel Lemon Other Clinician: Referring Provider: Harrel Lemon Treating Provider/Extender: Tito Dine in Treatment: 4 Active Problems ICD-10 Encounter Code Description Active Date MDM Diagnosis L89.312 Pressure ulcer of right buttock, stage 2 08/31/2020 No Yes L89.322 Pressure ulcer of left buttock, stage 2 08/31/2020 No Yes Inactive Problems Resolved Problems Electronic Signature(s) Signed: 09/29/2020 12:50:01 PM By: Linton Ham MD Entered By: Linton Ham on 09/28/2020 15:06:26 [name], [name] ([id number]) -------------------------------------------------------------------------------- Progress Note  Details Patient Name: James Prude T. Date of Service: 09/28/2020 1:45 PM Medical Record Number: [id number] Patient Account Number: [id number] Date of Birth/Sex: March 21, 1931 (87 y.o. M) Treating RN: Cornell Barman Primary Care Provider: Harrel Lemon Other Clinician: Referring Provider: Harrel Lemon Treating Provider/Extender: Tito Dine in Treatment: 4 Subjective History of Present Illness (HPI) ADMISSION 08/31/2020 This is an 87 year old independent man who lives at home on his own. His problem started in August when he fell and fractured his right femur. He required an ORIF. He was sent to a rehab facility. At some point in this timeframe he developed bilateral pressure ulcers on his buttock. He was seen by dermatology Dr. Nehemiah Massed on 06/27/2020 noting the pressure injuries of his bilateral buttocks at the time treatment order for salicylic acid 3% ointment. Saw his primary doctor in the same timeframe. He is currently using a compounded ointment to the area including iodoquinol 1% / 0.25% hydrocortisone/2% niacinamide. I am not really sure how this is been progressing. The wounds are stage II on the bilateral buttocks surrounding the coccyx area. The patient has been changing his dressing himself. He is a very active man in fact had another fall in November suffering a periprosthetic hip fracture requiring additional pinning. He says actually after the second surgery he improved and he no longer has as much pain. The patient is here with his daughter. He also has a son that lives nearby. Past medical history includes systolic congestive heart failure, ischemic cardiomyopathy, right femur fracture and subsequent periprosthetic fracture as described, chronic kidney disease stage III, MGUS and abdominal aortic aneurysm 12/29; since the patient was last here he was apparently admitted with congestive heart failure. He was not given any wound care supplies he was discharged on  Christmas day he apparently called here to get [name] to some orders and more of the collagen and apparently we did so but none never arrived. His son has been applying topical antibiotics. There is not too much change in the wound area on either side 09/28/2020; 2 small open areas on the mirror-image of the buttock bilaterally just below the coccyx level. This is in the fold of the gluteal cleft. Both surface areas are a lot smaller we have been using silver collagen and foam dressing Objective Constitutional Vitals Time Taken: 2:00 PM, Height: 71 in, Weight: 164 lbs, BMI: 22.9, Temperature: 97.4 F, Pulse: 92 bpm, Respiratory Rate: 16 breaths/min, Blood Pressure: 123/80 mmHg. Integumentary (Hair, Skin) Wound #1 status is Open. Original cause of wound was Pressure Injury. The wound is located on the Left Gluteus. The wound measures 0.3cm length x 0.2cm width x 0.1cm depth;  0.047cm^2 area and 0.005cm^3 volume. There is Fat Layer (Subcutaneous Tissue) exposed. There is no tunneling or undermining noted. There is a none present amount of drainage noted. The wound margin is flat and intact. There is no granulation within the wound bed. There is no necrotic tissue within the wound bed. Wound #2 status is Open. Original cause of wound was Pressure Injury. The wound is located on the Right Gluteus. The wound measures 0.7cm length x 0.9cm width x 0.1cm depth; 0.495cm^2 area and 0.049cm^3 volume. There is Fat Layer (Subcutaneous Tissue) exposed. There is no tunneling or undermining noted. There is a none present amount of drainage noted. The wound margin is flat and intact. There is large (67-100%) red, pink granulation within the wound bed. There is no necrotic tissue within the wound bed. Assessment Active Problems ICD-10 Pressure ulcer of right buttock, stage 2 Pressure ulcer of left buttock, stage 2 [name], James T. ([id number]) Plan 1. We continued with the collagen-based dressings these are being  changed every second day 2. His daughter was concerned about a rash I did not see any evidence of a rash the skin around the wounds looks healthy. 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 Date of Service: 09/28/2020 Medical Record Number: [id number] Patient Account Number: [id number] Date of Birth/Sex: [birth year] (87 y.o. M) Treating RN: Cornell Barman Primary Care Provider: Harrel Lemon Other Clinician: Referring Provider: Harrel Lemon Treating Provider/Extender: Tito Dine in Treatment: 4 Diagnosis Coding ICD-10 Codes Code Description [phone] Pressure ulcer of right buttock, stage 2 L89.322 Pressure ulcer of left buttock, stage 2 Facility Procedures CPT4 Code: [id number] Description: [phone] - DEBRIDE WOUND 1ST 20 SQ CM OR < Modifier: Quantity: 1 CPT4 Code: Description: ICD-10 Diagnosis Description L89.312 Pressure ulcer of right buttock, stage 2 L89.322 Pressure ulcer of left buttock, stage 2 Modifier: Quantity: Physician Procedures CPT4 Code: [id number] Description: [id number] - WC PHYS DEBR WO ANESTH 20 SQ CM Modifier: Quantity: 1 CPT4 Code: Description: ICD-10 Diagnosis Description M21.031 Pressure ulcer of right buttock, stage 2 L89.322 Pressure ulcer of left buttock, stage 2 Modifier: Quantity: Electronic Signature(s) Signed: 09/28/2020 5:51:23 PM By: Gretta Cool, BSN, RN, CWS, Kim RN, BSN Signed: 09/29/2020 12:50:01 PM By: Linton Ham MD Entered By: Gretta Cool, BSN, RN, CWS, Kim on 09/28/2020 17:51:23

## 2020-10-01 NOTE — Progress Notes (Signed)
IOANE, BHOLA ([id number]) Visit Report for 09/28/2020 Arrival Information Details Patient Name: [name], [name]. Date of Service: 09/28/2020 1:45 PM Medical Record Number: [id number] Patient Account Number: [id number] Date of Birth/Sex: 1931/05/28 (87 y.o. M) Treating RN: Cornell Barman Primary Care Jama Mcmiller: Harrel Lemon Other Clinician: Referring Delila Kuklinski: Harrel Lemon Treating Treshawn Allen/Extender: Tito Dine in Treatment: 4 Visit Information History Since Last Visit Added or deleted any medications: No Patient Arrived: [name] Any new allergies or adverse reactions: No Arrival Time: 13:59 Had a fall or experienced change in No Accompanied By: daughter activities of daily living that may affect Transfer Assistance: None risk of falls: Patient Identification Verified: Yes Signs or symptoms of abuse/neglect since last visito No Secondary Verification Process Completed: Yes Hospitalized since last visit: No Implantable device outside of the clinic excluding No cellular tissue based products placed in the center since last visit: Has Dressing in Place as Prescribed: No Pain Present Now: No Electronic Signature(s) Signed: 09/28/2020 4:28:39 PM By: Lorine Bears RCP, RRT, CHT Entered By: Lorine Bears on 09/28/2020 14:00:45 [name], [name] ([id number]) -------------------------------------------------------------------------------- Encounter Discharge Information Details Patient Name: James Prude T. Date of Service: 09/28/2020 1:45 PM Medical Record Number: [id number] Patient Account Number: [id number] Date of Birth/Sex: 1930/12/27 (87 y.o. M) Treating RN: Cornell Barman Primary Care Gao Mitnick: Harrel Lemon Other Clinician: Referring Kharee Lesesne: Harrel Lemon Treating Damarien Nyman/Extender: Tito Dine in Treatment: 4 Encounter Discharge Information Items Post Procedure Vitals Discharge Condition: Stable Temperature (F):  97.4 Ambulatory Status: Walker Pulse (bpm): 92 Discharge Destination: Home Respiratory Rate (breaths/min): 16 Transportation: Private Auto Blood Pressure (mmHg): 123/80 Accompanied By: daughter Schedule Follow-up Appointment: Yes Clinical Summary of Care: Electronic Signature(s) Signed: 09/28/2020 5:23:34 PM By: Gretta Cool, BSN, RN, CWS, Kim RN, BSN Entered By: Gretta Cool, BSN, RN, CWS, Kim on 09/28/2020 17:23:34 Drier, [name] ([id number]) -------------------------------------------------------------------------------- Lower Extremity Assessment Details Patient Name: James Prude T. Date of Service: 09/28/2020 1:45 PM Medical Record Number: [id number] Patient Account Number: [id number] Date of Birth/Sex: [birth year] (87 y.o. M) Treating RN: Cornell Barman Primary Care Farrell [name]: Harrel Lemon Other Clinician: Referring Airanna Partin: Harrel Lemon Treating Jessika Rothery/Extender: Tito Dine in Treatment: 4 Electronic Signature(s) Signed: 10/01/2020 10:48:52 AM By: Gretta Cool, BSN, RN, CWS, Kim RN, BSN Entered By: Gretta Cool, BSN, RN, CWS, Kim on 09/28/2020 14:39:48 [name], [name] ([id number]) -------------------------------------------------------------------------------- Multi Wound Chart Details Patient Name: James Prude T. Date of Service: 09/28/2020 1:45 PM Medical Record Number: [id number] Patient Account Number: [id number] Date of Birth/Sex: Jan 08, 1931 (87 y.o. M) Treating RN: Cornell Barman Primary Care Anay Rathe: Harrel Lemon Other Clinician: Referring Pavel Gadd: Harrel Lemon Treating Maureen Delatte/Extender: Tito Dine in Treatment: 4 Vital Signs Height(in): 71 Pulse(bpm): 92 Weight(lbs): 164 Blood Pressure(mmHg): 123/80 Body Mass Index(BMI): 23 Temperature(F): 97.4 Respiratory Rate(breaths/min): 16 Photos: [N/A:N/A] Wound Location: Left Gluteus Right Gluteus N/A Wounding Event: Pressure Injury Pressure Injury N/A Primary Etiology: Pressure Ulcer Pressure Ulcer  N/A Comorbid History: Coronary Artery Disease, Coronary Artery Disease, N/A Hypertension, History of pressure Hypertension, History of pressure wounds wounds Date Acquired: 05/18/2020 05/18/2020 N/A Weeks of Treatment: 4 4 N/A Wound Status: Open Open N/A Clustered Wound: Yes No N/A Clustered Quantity: 3 N/A N/A Measurements L x W x D (cm) 0.3x0.2x0.1 0.7x0.9x0.1 N/A Area (cm) : 0.047 0.495 N/A Volume (cm) : 0.005 0.049 N/A % Reduction in Area: 98.90% -26.00% N/A % Reduction in Volume: 98.80% -25.60% N/A Classification: Category/Stage II Category/Stage II N/A Exudate Amount: None Present None Present N/A Wound Margin: Flat and Intact Flat and Intact  N/A Granulation Amount: None Present (0%) Large (67-100%) N/A Granulation Quality: N/A Red, Pink N/A Necrotic Amount: None Present (0%) None Present (0%) N/A Exposed Structures: Fat Layer (Subcutaneous Tissue): Fat Layer (Subcutaneous Tissue): N/A Yes Yes Fascia: No Fascia: No Tendon: No Tendon: No Muscle: No Muscle: No Joint: No Joint: No Bone: No Bone: No Epithelialization: None None N/A Treatment Notes Electronic Signature(s) Signed: 09/28/2020 5:18:00 PM By: Gretta Cool, BSN, RN, CWS, Kim RN, BSN Entered By: Gretta Cool, BSN, RN, CWS, Kim on 09/28/2020 17:17:59 Longan, [name] ([id number]) -------------------------------------------------------------------------------- Multi-Disciplinary Care Plan Details Patient Name: James Prude T. Date of Service: 09/28/2020 1:45 PM Medical Record Number: [id number] Patient Account Number: [id number] Date of Birth/Sex: 13-Dec-1930 (87 y.o. M) Treating RN: Cornell Barman Primary Care Areli Frary: Harrel Lemon Other Clinician: Referring Arnetra Terris: Harrel Lemon Treating Margurette Brener/Extender: Tito Dine in Treatment: 4 Active Inactive Abuse / Safety / Falls / Self Care Management Nursing Diagnoses: History of Falls Goals: Patient/caregiver will verbalize understanding of skin care  regimen Date Initiated: 08/31/2020 Target Resolution Date: 10/02/2019 Goal Status: Active Interventions: Assess fall risk on admission and as needed Notes: Orientation to the Wound Care Program Nursing Diagnoses: Knowledge deficit related to the wound healing center program Goals: Patient/caregiver will verbalize understanding of the [facility] Date Initiated: 08/31/2020 Target Resolution Date: 10/01/2020 Goal Status: Active Interventions: Provide education on orientation to the wound center Notes: Wound/Skin Impairment Nursing Diagnoses: Knowledge deficit related to ulceration/compromised skin integrity Goals: Patient/caregiver will verbalize understanding of skin care regimen Date Initiated: 08/31/2020 Target Resolution Date: 10/01/2020 Goal Status: Active Ulcer/skin breakdown will have a volume reduction of 30% by week 4 Date Initiated: 08/31/2020 Target Resolution Date: 10/01/2020 Goal Status: Active Interventions: Assess ulceration(s) every visit Treatment Activities: Skin care regimen initiated : 08/31/2020 Notes: Electronic Signature(s) Signed: 09/28/2020 5:17:47 PM By: Gretta Cool, BSN, RN, CWS, Kim RN, BSN Entered By: Gretta Cool, BSN, RN, CWS, Kim on 09/28/2020 17:17:47 [name], [name] ([id number]) Case, [name] ([id number]) -------------------------------------------------------------------------------- Pain Assessment Details Patient Name: James Prude T. Date of Service: 09/28/2020 1:45 PM Medical Record Number: [id number] Patient Account Number: [id number] Date of Birth/Sex: 03/13/1931 (87 y.o. M) Treating RN: Cornell Barman Primary Care Zebbie Ace: Harrel Lemon Other Clinician: Referring Syris Brookens: Harrel Lemon Treating Keland Peyton/Extender: Tito Dine in Treatment: 4 Active Problems Location of Pain Severity and Description of Pain Patient Has Paino Yes Site Locations Pain Location: Pain in Ulcers Rate the pain. Current Pain Level:  2 Pain Management and Medication Current Pain Management: Notes Pain on right side. Electronic Signature(s) Signed: 10/01/2020 10:48:52 AM By: Gretta Cool, BSN, RN, CWS, Kim RN, BSN Entered By: Gretta Cool, BSN, RN, CWS, Kim on 09/28/2020 14:34:57 [name], [name] ([id number]) -------------------------------------------------------------------------------- Patient/Caregiver Education Details Patient Name: James Prude T. Date of Service: 09/28/2020 1:45 PM Medical Record Number: [id number] Patient Account Number: [id number] Date of Birth/Gender: [birth year] (87 y.o. M) Treating RN: Cornell Barman Primary Care Physician: Harrel Lemon Other Clinician: Referring Physician: Harrel Lemon Treating Physician/Extender: Tito Dine in Treatment: 4 Education Assessment Education Provided To: Patient Education Topics Provided Pressure: Handouts: Pressure Ulcers: Care and Offloading Methods: Demonstration, Explain/Verbal Responses: State content correctly Wound Debridement: Handouts: Wound Debridement Methods: Demonstration, Explain/Verbal Responses: State content correctly Electronic Signature(s) Signed: 10/01/2020 10:48:52 AM By: Gretta Cool, BSN, RN, CWS, Kim RN, BSN Entered By: Gretta Cool, BSN, RN, CWS, Kim on 09/28/2020 17:22:34 [name], [name] ([id number]) -------------------------------------------------------------------------------- Wound Assessment Details Patient Name: James Prude T. Date of Service: 09/28/2020 1:45 PM Medical Record Number: [id number] Patient Account Number:  [id number] Date of Birth/Sex: 1931/01/29 (87 y.o. M) Treating RN: Cornell Barman Primary Care Karalyne Nusser: Harrel Lemon Other Clinician: Referring Tyrique Sporn: Harrel Lemon Treating Vincie Linn/Extender: Tito Dine in Treatment: 4 Wound Status Wound Number: 1 Primary Pressure Ulcer Etiology: Wound Location: Left Gluteus Wound Status: Open Wounding Event: Pressure Injury Comorbid Coronary Artery  Disease, Hypertension, History of Date Acquired: 05/18/2020 History: pressure wounds Weeks Of Treatment: 4 Clustered Wound: Yes Photos Photo Uploaded By: Georges Mouse, Minus Breeding on 09/28/2020 16:36:18 Wound Measurements Length: (cm) 0.3 Width: (cm) 0.2 Depth: (cm) 0.1 Clustered Quantity: 3 Area: (cm) 0.047 Volume: (cm) 0.005 % Reduction in Area: 98.9% % Reduction in Volume: 98.8% Epithelialization: None Tunneling: No Undermining: No Wound Description Classification: Category/Stage II Wound Margin: Flat and Intact Exudate Amount: None Present Foul Odor After Cleansing: No Slough/Fibrino No Wound Bed Granulation Amount: None Present (0%) Exposed Structure Necrotic Amount: None Present (0%) Fascia Exposed: No Fat Layer (Subcutaneous Tissue) Exposed: Yes Tendon Exposed: No Muscle Exposed: No Joint Exposed: No Bone Exposed: No Treatment Notes Wound #1 (Gluteus) Wound Laterality: Left Cleanser Peri-Wound Care Topical Fitzner, Hanad T. ([id number]) Primary Dressing Prisma 4.34 (in) Quantity: 1 Discharge Instruction: Moisten w/normal saline or sterile water; Cover wound as directed. Do not remove from wound bed. Secondary Dressing Coverlet Latex-Free Fabric Adhesive Dressings Quantity: 1 Discharge Instruction: 1.5 x 2 Secured With Compression Wrap Compression Stockings Add-Ons Electronic Signature(s) Signed: 10/01/2020 10:48:52 AM By: Gretta Cool, BSN, RN, CWS, Kim RN, BSN Entered By: Gretta Cool, BSN, RN, CWS, Kim on 09/28/2020 14:39:17 [name], [name] ([id number]) -------------------------------------------------------------------------------- Wound Assessment Details Patient Name: James Prude T. Date of Service: 09/28/2020 1:45 PM Medical Record Number: [id number] Patient Account Number: [id number] Date of Birth/Sex: May 05, 1931 (87 y.o. M) Treating RN: Cornell Barman Primary Care Dania Marsan: Harrel Lemon Other Clinician: Referring Burlene Montecalvo: Harrel Lemon Treating  Juwan Vences/Extender: Tito Dine in Treatment: 4 Wound Status Wound Number: 2 Primary Pressure Ulcer Etiology: Wound Location: Right Gluteus Wound Status: Open Wounding Event: Pressure Injury Comorbid Coronary Artery Disease, Hypertension, History of Date Acquired: 05/18/2020 History: pressure wounds Weeks Of Treatment: 4 Clustered Wound: No Photos Photo Uploaded By: Georges Mouse, Minus Breeding on 09/28/2020 16:36:35 Wound Measurements Length: (cm) 0.7 Width: (cm) 0.9 Depth: (cm) 0.1 Area: (cm) 0.495 Volume: (cm) 0.049 % Reduction in Area: -26% % Reduction in Volume: -25.6% Epithelialization: None Tunneling: No Undermining: No Wound Description Classification: Category/Stage II Wound Margin: Flat and Intact Exudate Amount: None Present Foul Odor After Cleansing: No Slough/Fibrino No Wound Bed Granulation Amount: Large (67-100%) Exposed Structure Granulation Quality: Red, Pink Fascia Exposed: No Necrotic Amount: None Present (0%) Fat Layer (Subcutaneous Tissue) Exposed: Yes Tendon Exposed: No Muscle Exposed: No Joint Exposed: No Bone Exposed: No Treatment Notes Wound #2 (Gluteus) Wound Laterality: Right Cleanser Peri-Wound Care Topical Primary Dressing [name], James T. ([id number]) Prisma 4.34 (in) Discharge Instruction: Moisten w/normal saline or sterile water; Cover wound as directed. Do not remove from wound bed. Secondary Dressing Coverlet Latex-Free Fabric Adhesive Dressings Quantity: 1 Discharge Instruction: 1.5 x 2 Secured With Compression Wrap Compression Stockings Add-Ons Electronic Signature(s) Signed: 10/01/2020 10:48:52 AM By: Gretta Cool, BSN, RN, CWS, Kim RN, BSN Entered By: Gretta Cool, BSN, RN, CWS, Kim on 09/28/2020 14:39:36 [name], [name] ([id number]) -------------------------------------------------------------------------------- Twilight Details Patient Name: James Prude T. Date of Service: 09/28/2020 1:45 PM Medical Record Number:  [id number] Patient Account Number: [id number] Date of Birth/Sex: December 27, 1930 (87 y.o. M) Treating RN: Cornell Barman Primary Care Roiza Wiedel: Harrel Lemon Other Clinician: Referring Maryagnes Carrasco: Harrel Lemon Treating Sotero Brinkmeyer/Extender: Ricard Dillon  Weeks in Treatment: 4 Vital Signs Time Taken: 14:00 Temperature (F): 97.4 Height (in): 71 Pulse (bpm): 92 Weight (lbs): 164 Respiratory Rate (breaths/min): 16 Body Mass Index (BMI): 22.9 Blood Pressure (mmHg): 123/80 Reference Range: 80 - 120 mg / dl Electronic Signature(s) Signed: 09/28/2020 4:28:39 PM By: Lorine Bears RCP, RRT, CHT Entered By: Lorine Bears on 09/28/2020 14:04:19

## 2020-10-11 DIAGNOSIS — I2510 Atherosclerotic heart disease of native coronary artery without angina pectoris: Secondary | ICD-10-CM | POA: Diagnosis not present

## 2020-10-11 DIAGNOSIS — I10 Essential (primary) hypertension: Secondary | ICD-10-CM | POA: Diagnosis not present

## 2020-10-11 DIAGNOSIS — E782 Mixed hyperlipidemia: Secondary | ICD-10-CM | POA: Diagnosis not present

## 2020-10-11 DIAGNOSIS — I340 Nonrheumatic mitral (valve) insufficiency: Secondary | ICD-10-CM | POA: Diagnosis not present

## 2020-10-11 DIAGNOSIS — I714 Abdominal aortic aneurysm, without rupture: Secondary | ICD-10-CM | POA: Diagnosis not present

## 2020-10-11 DIAGNOSIS — I071 Rheumatic tricuspid insufficiency: Secondary | ICD-10-CM | POA: Diagnosis not present

## 2020-10-11 DIAGNOSIS — I350 Nonrheumatic aortic (valve) stenosis: Secondary | ICD-10-CM | POA: Diagnosis not present

## 2020-10-11 DIAGNOSIS — I255 Ischemic cardiomyopathy: Secondary | ICD-10-CM | POA: Diagnosis not present

## 2020-10-11 DIAGNOSIS — J3089 Other allergic rhinitis: Secondary | ICD-10-CM | POA: Diagnosis not present

## 2020-10-11 DIAGNOSIS — I493 Ventricular premature depolarization: Secondary | ICD-10-CM | POA: Diagnosis not present

## 2020-10-11 DIAGNOSIS — I471 Supraventricular tachycardia: Secondary | ICD-10-CM | POA: Diagnosis not present

## 2020-10-11 DIAGNOSIS — I509 Heart failure, unspecified: Secondary | ICD-10-CM | POA: Diagnosis not present

## 2020-10-12 ENCOUNTER — Ambulatory Visit: Payer: PPO | Admitting: Internal Medicine

## 2020-10-13 DIAGNOSIS — J9601 Acute respiratory failure with hypoxia: Secondary | ICD-10-CM | POA: Diagnosis not present

## 2020-10-13 DIAGNOSIS — J189 Pneumonia, unspecified organism: Secondary | ICD-10-CM | POA: Diagnosis not present

## 2020-10-13 DIAGNOSIS — E782 Mixed hyperlipidemia: Secondary | ICD-10-CM | POA: Diagnosis not present

## 2020-10-13 DIAGNOSIS — N1831 Chronic kidney disease, stage 3a: Secondary | ICD-10-CM | POA: Diagnosis not present

## 2020-10-13 DIAGNOSIS — I5021 Acute systolic (congestive) heart failure: Secondary | ICD-10-CM | POA: Diagnosis not present

## 2020-10-13 DIAGNOSIS — I2510 Atherosclerotic heart disease of native coronary artery without angina pectoris: Secondary | ICD-10-CM | POA: Diagnosis not present

## 2020-10-13 DIAGNOSIS — Z0000 Encounter for general adult medical examination without abnormal findings: Secondary | ICD-10-CM | POA: Diagnosis not present

## 2020-10-13 DIAGNOSIS — J90 Pleural effusion, not elsewhere classified: Secondary | ICD-10-CM | POA: Diagnosis not present

## 2020-10-14 ENCOUNTER — Ambulatory Visit: Payer: PPO | Admitting: Family

## 2020-10-18 DIAGNOSIS — S7291XA Unspecified fracture of right femur, initial encounter for closed fracture: Secondary | ICD-10-CM | POA: Diagnosis not present

## 2020-10-19 ENCOUNTER — Encounter: Payer: PPO | Attending: Internal Medicine | Admitting: Internal Medicine

## 2020-10-19 ENCOUNTER — Other Ambulatory Visit: Payer: Self-pay

## 2020-10-19 DIAGNOSIS — I255 Ischemic cardiomyopathy: Secondary | ICD-10-CM | POA: Diagnosis not present

## 2020-10-19 DIAGNOSIS — D472 Monoclonal gammopathy: Secondary | ICD-10-CM | POA: Diagnosis not present

## 2020-10-19 DIAGNOSIS — L89322 Pressure ulcer of left buttock, stage 2: Secondary | ICD-10-CM | POA: Insufficient documentation

## 2020-10-19 DIAGNOSIS — I714 Abdominal aortic aneurysm, without rupture: Secondary | ICD-10-CM | POA: Diagnosis not present

## 2020-10-19 DIAGNOSIS — N183 Chronic kidney disease, stage 3 unspecified: Secondary | ICD-10-CM | POA: Diagnosis not present

## 2020-10-19 DIAGNOSIS — I5020 Unspecified systolic (congestive) heart failure: Secondary | ICD-10-CM | POA: Insufficient documentation

## 2020-10-19 DIAGNOSIS — L89312 Pressure ulcer of right buttock, stage 2: Secondary | ICD-10-CM | POA: Diagnosis not present

## 2020-10-19 NOTE — Progress Notes (Signed)
AZURE, BARRALES ([id number]) Visit Report for 10/19/2020 HPI Details Patient Name: [name], [name]. Date of Service: 10/19/2020 3:45 PM Medical Record Number: [id number] Patient Account Number: [id number] Date of Birth/Sex: 12/02/1930 (87 y.o. M) Treating RN: Cornell Barman Primary Care Provider: Harrel Lemon Other Clinician: Referring Provider: Harrel Lemon Treating Provider/Extender: Tito Dine in Treatment: 7 History of Present Illness HPI Description: ADMISSION 08/31/2020 This is an 87 year old independent man who lives at home on his own. His problem started in August when he fell and fractured his right femur. He required an ORIF. He was sent to a rehab facility. At some point in this timeframe he developed bilateral pressure ulcers on his buttock. He was seen by dermatology Dr. Nehemiah Massed on 06/27/2020 noting the pressure injuries of his bilateral buttocks at the time treatment order for salicylic acid 3% ointment. Saw his primary doctor in the same timeframe. He is currently using a compounded ointment to the area including iodoquinol 1% / 0.25% hydrocortisone/2% niacinamide. I am not really sure how this is been progressing. The wounds are stage II on the bilateral buttocks surrounding the coccyx area. The patient has been changing his dressing himself. He is a very active man in fact had another fall in November suffering a periprosthetic hip fracture requiring additional pinning. He says actually after the second surgery he improved and he no longer has as much pain. The patient is here with his daughter. He also has a son that lives nearby. Past medical history includes systolic congestive heart failure, ischemic cardiomyopathy, right femur fracture and subsequent periprosthetic fracture as described, chronic kidney disease stage III, MGUS and abdominal aortic aneurysm 12/29; since the patient was last here he was apparently admitted with congestive heart failure. He was  not given any wound care supplies he was discharged on Christmas day he apparently called here to get [name] to some orders and more of the collagen and apparently we did so but none never arrived. His son has been applying topical antibiotics. There is not too much change in the wound area on either side 09/28/2020; 2 small open areas on the mirror-image of the buttock bilaterally just below the coccyx level. This is in the fold of the gluteal cleft. Both surface areas are a lot smaller we have been using silver collagen and foam dressing 10/19/2020; 2 small areas in the mirror image of the buttock bilaterally just below the coccyx level in the gluteal cleft. These are actually totally epithelialized today which is good to see Electronic Signature(s) Signed: 10/19/2020 6:09:58 PM By: Linton Ham MD Entered By: Linton Ham on 10/19/2020 16:34:47 [name], [name] ([id number]) -------------------------------------------------------------------------------- Physical Exam Details Patient Name: James Prude T. Date of Service: 10/19/2020 3:45 PM Medical Record Number: [id number] Patient Account Number: [id number] Date of Birth/Sex: Jan 08, 1931 (87 y.o. M) Treating RN: Cornell Barman Primary Care Provider: Harrel Lemon Other Clinician: Referring Provider: Harrel Lemon Treating Provider/Extender: Tito Dine in Treatment: 7 Notes Wound exam; the patient has no open wounds. His original wounds which are mirror-image wounds are fully epithelialized and looks very healthy. No evidence of surrounding erythema Electronic Signature(s) Signed: 10/19/2020 6:09:58 PM By: Linton Ham MD Entered By: Linton Ham on 10/19/2020 16:37:49 [name], [name] ([id number]) -------------------------------------------------------------------------------- Physician Orders Details Patient Name: James Prude T. Date of Service: 10/19/2020 3:45 PM Medical Record Number: [id number] Patient Account  Number: [id number] Date of Birth/Sex: June 01, 1931 (87 y.o. M) Treating RN: Dolan Amen Primary Care Provider: Harrel Lemon Other Clinician: Referring Provider:  Harrel Lemon Treating Provider/Extender: Tito Dine in Treatment: 7 Verbal / Phone Orders: No Diagnosis Coding Discharge From [facility] Services o Discharge from [place] Treatment Complete Electronic Signature(s) Signed: 10/19/2020 4:41:38 PM By: Charlett Nose RN Signed: 10/19/2020 6:09:58 PM By: Linton Ham MD Entered By: Georges Mouse, Minus Breeding on 10/19/2020 16:21:48 [name], [name] ([id number]) -------------------------------------------------------------------------------- Problem List Details Patient Name: James Prude T. Date of Service: 10/19/2020 3:45 PM Medical Record Number: [id number] Patient Account Number: [id number] Date of Birth/Sex: 1930-10-05 (87 y.o. M) Treating RN: Cornell Barman Primary Care Provider: Harrel Lemon Other Clinician: Referring Provider: Harrel Lemon Treating Provider/Extender: Tito Dine in Treatment: 7 Active Problems ICD-10 Encounter Code Description Active Date MDM Diagnosis L89.312 Pressure ulcer of right buttock, stage 2 08/31/2020 No Yes L89.322 Pressure ulcer of left buttock, stage 2 08/31/2020 No Yes Inactive Problems Resolved Problems Electronic Signature(s) Signed: 10/19/2020 6:09:58 PM By: Linton Ham MD Entered By: Linton Ham on 10/19/2020 16:34:03 [name], [name] ([id number]) -------------------------------------------------------------------------------- Progress Note Details Patient Name: James Prude T. Date of Service: 10/19/2020 3:45 PM Medical Record Number: [id number] Patient Account Number: [id number] Date of Birth/Sex: May 04, 1931 (87 y.o. M) Treating RN: Cornell Barman Primary Care Provider: Harrel Lemon Other Clinician: Referring Provider: Harrel Lemon Treating Provider/Extender: Tito Dine in  Treatment: 7 Subjective History of Present Illness (HPI) ADMISSION 08/31/2020 This is an 87 year old independent man who lives at home on his own. His problem started in August when he fell and fractured his right femur. He required an ORIF. He was sent to a rehab facility. At some point in this timeframe he developed bilateral pressure ulcers on his buttock. He was seen by dermatology Dr. Nehemiah Massed on 06/27/2020 noting the pressure injuries of his bilateral buttocks at the time treatment order for salicylic acid 3% ointment. Saw his primary doctor in the same timeframe. He is currently using a compounded ointment to the area including iodoquinol 1% / 0.25% hydrocortisone/2% niacinamide. I am not really sure how this is been progressing. The wounds are stage II on the bilateral buttocks surrounding the coccyx area. The patient has been changing his dressing himself. He is a very active man in fact had another fall in November suffering a periprosthetic hip fracture requiring additional pinning. He says actually after the second surgery he improved and he no longer has as much pain. The patient is here with his daughter. He also has a son that lives nearby. Past medical history includes systolic congestive heart failure, ischemic cardiomyopathy, right femur fracture and subsequent periprosthetic fracture as described, chronic kidney disease stage III, MGUS and abdominal aortic aneurysm 12/29; since the patient was last here he was apparently admitted with congestive heart failure. He was not given any wound care supplies he was discharged on Christmas day he apparently called here to get [name] to some orders and more of the collagen and apparently we did so but none never arrived. His son has been applying topical antibiotics. There is not too much change in the wound area on either side 09/28/2020; 2 small open areas on the mirror-image of the buttock bilaterally just below the coccyx level. This is in  the fold of the gluteal cleft. Both surface areas are a lot smaller we have been using silver collagen and foam dressing 10/19/2020; 2 small areas in the mirror image of the buttock bilaterally just below the coccyx level in the gluteal cleft. These are actually totally epithelialized today which is good to see Objective  Constitutional Vitals Time Taken: 3:50 PM, Height: 71 in, Weight: 164 lbs, BMI: 22.9, Temperature: 97.7 F, Pulse: 89 bpm, Respiratory Rate: 16 breaths/min, Blood Pressure: 124/81 mmHg. Integumentary (Hair, Skin) Wound #1 status is Open. Original cause of wound was Pressure Injury. The wound is located on the Left Gluteus. The wound measures 0cm length x 0cm width x 0cm depth; 0cm^2 area and 0cm^3 volume. There is no tunneling or undermining noted. There is a none present amount of drainage noted. The wound margin is flat and intact. There is no granulation within the wound bed. There is no necrotic tissue within the wound bed. Wound #2 status is Open. Original cause of wound was Pressure Injury. The wound is located on the Right Gluteus. The wound measures 0cm length x 0cm width x 0cm depth; 0cm^2 area and 0cm^3 volume. There is no tunneling or undermining noted. There is a none present amount of drainage noted. The wound margin is flat and intact. There is no granulation within the wound bed. There is no necrotic tissue within the wound bed. Assessment Active Problems ICD-10 Pressure ulcer of right buttock, stage 2 Pressure ulcer of left buttock, stage 2 [name], James T. ([id number]) Plan Discharge From [facility] Services: Discharge from [place] [facility] Complete 1. The patient can be discharged from the wound care center his treatment is complete 2. These areas happen while rehabilitation in the skilled facility after a hip fracture on the right. It seems unlikely that this will be repeated in the medium term. He is aware of this issue now and will be vigilant if  he ever gets into a similar situation Electronic Signature(s) Signed: 10/19/2020 6:09:58 PM By: Linton Ham MD Entered By: Linton Ham on 10/19/2020 16:38:47 [name], [name] ([id number]) -------------------------------------------------------------------------------- SuperBill Details Patient Name: James Prude T. Date of Service: 10/19/2020 Medical Record Number: [id number] Patient Account Number: [id number] Date of Birth/Sex: [birth year] (87 y.o. M) Treating RN: Dolan Amen Primary Care Provider: Harrel Lemon Other Clinician: Referring Provider: Harrel Lemon Treating Provider/Extender: Tito Dine in Treatment: 7 Diagnosis Coding ICD-10 Codes Code Description [phone] Pressure ulcer of right buttock, stage 2 L89.322 Pressure ulcer of left buttock, stage 2 Facility Procedures CPT4 Code: [id number] Description: [phone] - WOUND CARE VISIT-LEV 2 EST PT Modifier: Quantity: 1 Physician Procedures CPT4 Code: [id number] Description: [id number] - WC PHYS LEVEL 2 - EST PT Modifier: Quantity: 1 CPT4 Code: Description: ICD-10 Diagnosis Description U38.333 Pressure ulcer of right buttock, stage 2 L89.322 Pressure ulcer of left buttock, stage 2 Modifier: Quantity: Electronic Signature(s) Signed: 10/19/2020 6:09:58 PM By: Linton Ham MD Entered By: Linton Ham on 10/19/2020 16:39:13

## 2020-10-19 NOTE — Progress Notes (Signed)
[name], [name] ([id number]) Visit Report for 10/19/2020 Arrival Information Details Patient Name: [name]. Date of Service: 10/19/2020 3:45 PM Medical Record Number: [id number] Patient Account Number: [id number] Date of Birth/Sex: 20-Feb-1931 (87 y.o. M) Treating Holt: [name] Primary Care [name]: [name] Other Clinician: Referring [name]: [name] Treating [name]/Extender: [name] in Treatment: 7 Visit Information History Since Last Visit Added or deleted any medications: No Patient Arrived: [name] Any new allergies or adverse reactions: No Arrival Time: 15:49 Had a fall or experienced change in No Accompanied By: daughter activities of daily living that may affect Transfer Assistance: None risk of falls: Patient Identification Verified: Yes Signs or symptoms of abuse/neglect since last visito No Secondary Verification Process Completed: Yes Hospitalized since last visit: No Implantable device outside of the clinic excluding No cellular tissue based products placed in the center since last visit: Has Dressing in Place as Prescribed: Yes Pain Present Now: No Electronic Signature(s) Signed: 10/19/2020 4:47:42 PM By: [name] RCP, RRT, CHT Entered By: [name] on 10/19/2020 15:52:08 Holt, [name] ([id number]) -------------------------------------------------------------------------------- Clinic Level of Care Assessment Details Patient Name: James Prude T. Date of Service: 10/19/2020 3:45 PM Medical Record Number: [id number] Patient Account Number: [id number] Date of Birth/Sex: 11/15/1930 (87 y.o. M) Treating Holt: [name] Primary Care [name]: [name] Other Clinician: Referring [name]: [name] Treating [name]/Extender: [name] in Treatment: 7 Clinic Level of Care Assessment Items TOOL 4 Quantity Score X - Use when only an EandM is performed on  FOLLOW-UP visit 1 0 ASSESSMENTS - Nursing Assessment / Reassessment X - Reassessment of Co-morbidities (includes updates in patient status) 1 10 X- 1 5 Reassessment of Adherence to Treatment Plan ASSESSMENTS - Wound and Skin Assessment / Reassessment X - Simple Wound Assessment / Reassessment - one wound 1 5 []  - 0 Complex Wound Assessment / Reassessment - multiple wounds []  - 0 Dermatologic / Skin Assessment (not related to wound area) ASSESSMENTS - Focused Assessment []  - Circumferential Edema Measurements - multi extremities 0 []  - 0 Nutritional Assessment / Counseling / Intervention []  - 0 Lower Extremity Assessment (monofilament, tuning fork, pulses) []  - 0 Peripheral Arterial Disease Assessment (using hand held doppler) ASSESSMENTS - Ostomy and/or Continence Assessment and Care []  - Incontinence Assessment and Management 0 []  - 0 Ostomy Care Assessment and Management (repouching, etc.) PROCESS - Coordination of Care X - Simple Patient / Family Education for ongoing care 1 15 []  - 0 Complex (extensive) Patient / Family Education for ongoing care []  - 0 Staff obtains Programmer, systems, Records, Test Results / Process Orders []  - 0 Staff telephones HHA, Nursing Homes / Clarify orders / etc []  - 0 Routine Transfer to another Facility (non-emergent condition) []  - 0 Routine Hospital Admission (non-emergent condition) []  - 0 New Admissions / Biomedical engineer / Ordering NPWT, Apligraf, etc. []  - 0 Emergency Hospital Admission (emergent condition) X- 1 10 Simple Discharge Coordination []  - 0 Complex (extensive) Discharge Coordination PROCESS - Special Needs []  - Pediatric / Minor Patient Management 0 []  - 0 Isolation Patient Management []  - 0 Hearing / Language / Visual special needs []  - 0 Assessment of Community assistance (transportation, D/C planning, etc.) []  - 0 Additional assistance / Altered mentation []  - 0 Support Surface(s) Assessment (bed, cushion, seat,  etc.) INTERVENTIONS - Wound Cleansing / Measurement [name], [name] ([id number]) X- 1 5 Simple Wound Cleansing - one wound []  - 0 Complex Wound Cleansing - multiple wounds  X- 1 5 Wound Imaging (photographs - any number of wounds) []  - 0 Wound Tracing (instead of photographs) X- 1 5 Simple Wound Measurement - one wound []  - 0 Complex Wound Measurement - multiple wounds INTERVENTIONS - Wound Dressings []  - Small Wound Dressing one or multiple wounds 0 []  - 0 Medium Wound Dressing one or multiple wounds []  - 0 Large Wound Dressing one or multiple wounds []  - 0 Application of Medications - topical []  - 0 Application of Medications - injection INTERVENTIONS - Miscellaneous []  - External ear exam 0 []  - 0 Specimen Collection (cultures, biopsies, blood, body fluids, etc.) []  - 0 Specimen(s) / Culture(s) sent or taken to Lab for analysis []  - 0 Patient Transfer (multiple staff / Civil Service fast streamer / Similar devices) []  - 0 Simple Staple / Suture removal (25 or less) []  - 0 Complex Staple / Suture removal (26 or more) []  - 0 Hypo / Hyperglycemic Management (close monitor of Blood Glucose) []  - 0 Ankle / Brachial Index (ABI) - do not check if billed separately X- 1 5 Vital Signs Has the patient been seen at the hospital within the last three years: Yes Total Score: 65 Level Of Care: New/Established - Level 2 Electronic Signature(s) Signed: 10/19/2020 4:41:38 PM By: [name], [name] Entered By: [name], [name] on 10/19/2020 16:23:43 [name] ([id number]) -------------------------------------------------------------------------------- Encounter Discharge Information Details Patient Name: James Prude T. Date of Service: 10/19/2020 3:45 PM Medical Record Number: [id number] Patient Account Number: [id number] Date of Birth/Sex: [birth year] (87 y.o. M) Treating Holt: [name] Primary Care [name]: [name] Other Clinician: Referring [name]:  [name] Treating [name]/Extender: [name] in Treatment: 7 Encounter Discharge Information Items Discharge Condition: Stable Ambulatory Status: Walker Discharge Destination: Home Transportation: Private Auto Accompanied By: daughter Schedule Follow-up Appointment: No Clinical Summary of Care: Electronic Signature(s) Signed: 10/19/2020 4:41:38 PM By: [name], [name] Entered By: [name], James Breeding on 10/19/2020 16:25:31 Skerritt, [name] ([id number]) -------------------------------------------------------------------------------- Lower Extremity Assessment Details Patient Name: James Prude T. Date of Service: 10/19/2020 3:45 PM Medical Record Number: [id number] Patient Account Number: [id number] Date of Birth/Sex: 03/15/1931 (87 y.o. M) Treating Holt: Carlene Coria Primary Care Myanna Ziesmer: [name] Other Clinician: Referring Isabel Freese: [name] Treating Camdynn Maranto/Extender: [name] in Treatment: 7 Electronic Signature(s) Signed: 10/19/2020 4:56:52 PM By: Carlene Coria Holt Entered By: Carlene Coria on 10/19/2020 16:04:41 Fahs, [name] ([id number]) -------------------------------------------------------------------------------- Multi Wound Chart Details Patient Name: James Prude T. Date of Service: 10/19/2020 3:45 PM Medical Record Number: [id number] Patient Account Number: [id number] Date of Birth/Sex: 10-Sep-1931 (87 y.o. M) Treating Holt: [name] Primary Care Emersen Carroll: [name] Other Clinician: Referring Jewel Mcafee: [name] Treating Shalondra Wunschel/Extender: [name] in Treatment: 7 Vital Signs Height(in): 71 Pulse(bpm): 89 Weight(lbs): 164 Blood Pressure(mmHg): 124/81 Body Mass Index(BMI): 23 Temperature(F): 97.7 Respiratory Rate(breaths/min): 16 Photos: [N/A:N/A] Wound Location: Left Gluteus Right Gluteus N/A Wounding Event: Pressure Injury Pressure Injury N/A Primary Etiology: Pressure  Ulcer Pressure Ulcer N/A Comorbid History: Coronary Artery Disease, Coronary Artery Disease, N/A Hypertension, History of pressure Hypertension, History of pressure wounds wounds Date Acquired: 05/18/2020 05/18/2020 N/A Weeks of Treatment: 7 7 N/A Wound Status: Open Open N/A Clustered Wound: Yes No N/A Clustered Quantity: 3 N/A N/A Measurements L x W x D (cm) 0x0x0 0x0x0 N/A Area (cm) : 0 0 N/A Volume (cm) : 0 0 N/A % Reduction in Area: 100.00% 100.00% N/A % Reduction in Volume: 100.00% 100.00% N/A Classification: Category/Stage II Category/Stage  II N/A Exudate Amount: None Present None Present N/A Wound Margin: Flat and Intact Flat and Intact N/A Granulation Amount: None Present (0%) None Present (0%) N/A Necrotic Amount: None Present (0%) None Present (0%) N/A Exposed Structures: Fascia: No Fascia: No N/A Fat Layer (Subcutaneous Tissue): Fat Layer (Subcutaneous Tissue): No No Tendon: No Tendon: No Muscle: No Muscle: No Joint: No Joint: No Bone: No Bone: No Epithelialization: Large (67-100%) Large (67-100%) N/A Treatment Notes Wound #1 (Gluteus) Wound Laterality: Left Cleanser Peri-Wound Care Topical Primary Dressing COREN, SAGAN ([id number]) Secondary Dressing Secured With Compression Wrap Compression Stockings Add-Ons Wound #2 (Gluteus) Wound Laterality: Right Cleanser Peri-Wound Care Topical Primary Dressing Secondary Dressing Secured With Compression Wrap Compression Stockings Add-Ons Electronic Signature(s) Signed: 10/19/2020 6:09:58 PM By: Linton Ham MD Entered By: Linton Ham on 10/19/2020 16:34:08 Waters, [name] ([id number]) -------------------------------------------------------------------------------- [place] Details Patient Name: James Prude T. Date of Service: 10/19/2020 3:45 PM Medical Record Number: [id number] Patient Account Number: [id number] Date of Birth/Sex: [birth year] (87 y.o. M) Treating Holt:  [name] Primary Care Emeterio Balke: [name] Other Clinician: Referring Milina Pagett: [name] Treating Ezma Rehm/Extender: [name] in Treatment: 7 Active Inactive Electronic Signature(s) Signed: 10/19/2020 4:41:38 PM By: [name], [name] Entered By: [name], James Breeding on 10/19/2020 16:20:58 Kostick, [name] ([id number]) -------------------------------------------------------------------------------- Pain Assessment Details Patient Name: James Prude T. Date of Service: 10/19/2020 3:45 PM Medical Record Number: [id number] Patient Account Number: [id number] Date of Birth/Sex: May 05, 1931 (87 y.o. M) Treating Holt: Carlene Coria Primary Care Lyndel Sarate: [name] Other Clinician: Referring Phyliss Hulick: [name] Treating Drew Herman/Extender: [name] in Treatment: 7 Active Problems Location of Pain Severity and Description of Pain Patient Has Paino No Site Locations Pain Management and Medication Current Pain Management: Electronic Signature(s) Signed: 10/19/2020 4:56:52 PM By: Carlene Coria Holt Entered By: Carlene Coria on 10/19/2020 15:58:08 Kimball, [name] ([id number]) -------------------------------------------------------------------------------- Patient/Caregiver Education Details Patient Name: James Prude T. Date of Service: 10/19/2020 3:45 PM Medical Record Number: [id number] Patient Account Number: [id number] Date of Birth/Gender: 1931/08/10 (87 y.o. M) Treating Holt: [name] Primary Care Physician: [name] Other Clinician: Referring Physician: Harrel Holt Treating Physician/Extender: [name] in Treatment: 7 Education Assessment Education Provided To: Patient Education Topics Provided Notes continue offloading area-new skin-educated on preventative measures Electronic Signature(s) Signed: 10/19/2020 4:41:38 PM By: [name], [name] Entered By: [name], James Breeding on 10/19/2020  16:24:36 Rhodes, [name] ([id number]) -------------------------------------------------------------------------------- Wound Assessment Details Patient Name: James Prude T. Date of Service: 10/19/2020 3:45 PM Medical Record Number: [id number] Patient Account Number: [id number] Date of Birth/Sex: [birth year] (87 y.o. M) Treating Holt: Carlene Coria Primary Care Makynli Stills: [name] Other Clinician: Referring Keerthi Hazell: [name] Treating Akeema Broder/Extender: [name] in Treatment: 7 Wound Status Wound Number: 1 Primary Pressure Ulcer Etiology: Wound Location: Left Gluteus Wound Status: Open Wounding Event: Pressure Injury Comorbid Coronary Artery Disease, Hypertension, History of Date Acquired: 05/18/2020 History: pressure wounds Weeks Of Treatment: 7 Clustered Wound: Yes Photos Wound Measurements Length: (cm) 0 Width: (cm) 0 Depth: (cm) 0 Clustered Quantity: 3 Area: (cm) 0 Volume: (cm) 0 % Reduction in Area: 100% % Reduction in Volume: 100% Epithelialization: Large (67-100%) Tunneling: No Undermining: No Wound Description Classification: Category/Stage II Wound Margin: Flat and Intact Exudate Amount: None Present Foul Odor After Cleansing: No Slough/Fibrino No Wound Bed Granulation Amount: None Present (0%) Exposed Structure Necrotic Amount: None Present (0%) Fascia Exposed: No Fat Layer (Subcutaneous Tissue) Exposed: No Tendon Exposed: No Muscle Exposed: No  Joint Exposed: No Bone Exposed: No Electronic Signature(s) Signed: 10/19/2020 4:56:52 PM By: Carlene Coria Holt Entered By: Carlene Coria on 10/19/2020 16:03:58 Hodder, [name] ([id number]) -------------------------------------------------------------------------------- Wound Assessment Details Patient Name: James Prude T. Date of Service: 10/19/2020 3:45 PM Medical Record Number: [id number] Patient Account Number: [id number] Date of Birth/Sex: 1931/05/05 (87 y.o. M) Treating Holt: Carlene Coria Primary Care Liah Morr: [name] Other Clinician: Referring Gelena Klosinski: [name] Treating Agnes Brightbill/Extender: [name] in Treatment: 7 Wound Status Wound Number: 2 Primary Pressure Ulcer Etiology: Wound Location: Right Gluteus Wound Status: Open Wounding Event: Pressure Injury Comorbid Coronary Artery Disease, Hypertension, History of Date Acquired: 05/18/2020 History: pressure wounds Weeks Of Treatment: 7 Clustered Wound: No Photos Wound Measurements Length: (cm) 0 Width: (cm) 0 Depth: (cm) 0 Area: (cm) 0 Volume: (cm) 0 % Reduction in Area: 100% % Reduction in Volume: 100% Epithelialization: Large (67-100%) Tunneling: No Undermining: No Wound Description Classification: Category/Stage II Wound Margin: Flat and Intact Exudate Amount: None Present Foul Odor After Cleansing: No Slough/Fibrino No Wound Bed Granulation Amount: None Present (0%) Exposed Structure Necrotic Amount: None Present (0%) Fascia Exposed: No Fat Layer (Subcutaneous Tissue) Exposed: No Tendon Exposed: No Muscle Exposed: No Joint Exposed: No Bone Exposed: No Electronic Signature(s) Signed: 10/19/2020 4:56:52 PM By: Carlene Coria Holt Entered By: Carlene Coria on 10/19/2020 16:04:28 Marmolejos, [name] ([id number]) -------------------------------------------------------------------------------- Vitals Details Patient Name: James Prude T. Date of Service: 10/19/2020 3:45 PM Medical Record Number: [id number] Patient Account Number: [id number] Date of Birth/Sex: 1931-03-06 (87 y.o. M) Treating Holt: [name] Primary Care Kyran Connaughton: [name] Other Clinician: Referring Jenene Kauffmann: [name] Treating Nayelie Gionfriddo/Extender: [name] in Treatment: 7 Vital Signs Time Taken: 15:50 Temperature (F): 97.7 Height (in): 71 Pulse (bpm): 89 Weight (lbs): 164 Respiratory Rate (breaths/min): 16 Body Mass Index (BMI): 22.9 Blood Pressure (mmHg): 124/81 Reference  Range: 80 - 120 mg / dl Electronic Signature(s) Signed: 10/19/2020 4:47:42 PM By: [name] RCP, RRT, CHT Entered By: [name] on 10/19/2020 15:52:46

## 2020-10-24 ENCOUNTER — Telehealth: Payer: Self-pay | Admitting: Family

## 2020-10-24 NOTE — Telephone Encounter (Signed)
Unable to reach patient in attempt to reschedule a no show missed CHF Clinic appointment.   [name], NT

## 2020-11-04 DIAGNOSIS — M79604 Pain in right leg: Secondary | ICD-10-CM | POA: Diagnosis not present

## 2020-11-04 DIAGNOSIS — Z9889 Other specified postprocedural states: Secondary | ICD-10-CM | POA: Diagnosis not present

## 2020-11-04 DIAGNOSIS — Z8781 Personal history of (healed) traumatic fracture: Secondary | ICD-10-CM | POA: Diagnosis not present

## 2020-11-09 DIAGNOSIS — Z9889 Other specified postprocedural states: Secondary | ICD-10-CM | POA: Diagnosis not present

## 2020-11-09 DIAGNOSIS — M79604 Pain in right leg: Secondary | ICD-10-CM | POA: Diagnosis not present

## 2020-11-09 DIAGNOSIS — Z8781 Personal history of (healed) traumatic fracture: Secondary | ICD-10-CM | POA: Diagnosis not present

## 2020-11-10 DIAGNOSIS — D631 Anemia in chronic kidney disease: Secondary | ICD-10-CM | POA: Diagnosis not present

## 2020-11-10 DIAGNOSIS — I129 Hypertensive chronic kidney disease with stage 1 through stage 4 chronic kidney disease, or unspecified chronic kidney disease: Secondary | ICD-10-CM | POA: Diagnosis not present

## 2020-11-10 DIAGNOSIS — E871 Hypo-osmolality and hyponatremia: Secondary | ICD-10-CM | POA: Diagnosis not present

## 2020-11-10 DIAGNOSIS — I5022 Chronic systolic (congestive) heart failure: Secondary | ICD-10-CM | POA: Diagnosis not present

## 2020-11-10 DIAGNOSIS — N184 Chronic kidney disease, stage 4 (severe): Secondary | ICD-10-CM | POA: Diagnosis not present

## 2020-11-11 DIAGNOSIS — Z9889 Other specified postprocedural states: Secondary | ICD-10-CM | POA: Diagnosis not present

## 2020-11-11 DIAGNOSIS — M79604 Pain in right leg: Secondary | ICD-10-CM | POA: Diagnosis not present

## 2020-11-11 DIAGNOSIS — Z8781 Personal history of (healed) traumatic fracture: Secondary | ICD-10-CM | POA: Diagnosis not present

## 2020-11-15 DIAGNOSIS — Z8781 Personal history of (healed) traumatic fracture: Secondary | ICD-10-CM | POA: Diagnosis not present

## 2020-11-15 DIAGNOSIS — Z9889 Other specified postprocedural states: Secondary | ICD-10-CM | POA: Diagnosis not present

## 2020-11-18 DIAGNOSIS — Z8781 Personal history of (healed) traumatic fracture: Secondary | ICD-10-CM | POA: Diagnosis not present

## 2020-11-18 DIAGNOSIS — Z9889 Other specified postprocedural states: Secondary | ICD-10-CM | POA: Diagnosis not present

## 2020-11-18 DIAGNOSIS — M79604 Pain in right leg: Secondary | ICD-10-CM | POA: Diagnosis not present

## 2020-11-22 DIAGNOSIS — I509 Heart failure, unspecified: Secondary | ICD-10-CM | POA: Diagnosis not present

## 2020-11-22 DIAGNOSIS — I6523 Occlusion and stenosis of bilateral carotid arteries: Secondary | ICD-10-CM | POA: Diagnosis not present

## 2020-11-22 DIAGNOSIS — I10 Essential (primary) hypertension: Secondary | ICD-10-CM | POA: Diagnosis not present

## 2020-11-22 DIAGNOSIS — I714 Abdominal aortic aneurysm, without rupture: Secondary | ICD-10-CM | POA: Diagnosis not present

## 2020-11-22 DIAGNOSIS — N184 Chronic kidney disease, stage 4 (severe): Secondary | ICD-10-CM | POA: Diagnosis not present

## 2020-11-22 DIAGNOSIS — I350 Nonrheumatic aortic (valve) stenosis: Secondary | ICD-10-CM | POA: Diagnosis not present

## 2020-11-25 DIAGNOSIS — M79604 Pain in right leg: Secondary | ICD-10-CM | POA: Diagnosis not present

## 2020-11-25 DIAGNOSIS — Z9889 Other specified postprocedural states: Secondary | ICD-10-CM | POA: Diagnosis not present

## 2020-11-25 DIAGNOSIS — Z8781 Personal history of (healed) traumatic fracture: Secondary | ICD-10-CM | POA: Diagnosis not present

## 2020-11-29 DIAGNOSIS — Z9889 Other specified postprocedural states: Secondary | ICD-10-CM | POA: Diagnosis not present

## 2020-11-29 DIAGNOSIS — Z8781 Personal history of (healed) traumatic fracture: Secondary | ICD-10-CM | POA: Diagnosis not present

## 2020-12-02 DIAGNOSIS — Z9889 Other specified postprocedural states: Secondary | ICD-10-CM | POA: Diagnosis not present

## 2020-12-02 DIAGNOSIS — Z8781 Personal history of (healed) traumatic fracture: Secondary | ICD-10-CM | POA: Diagnosis not present

## 2020-12-02 DIAGNOSIS — M79604 Pain in right leg: Secondary | ICD-10-CM | POA: Diagnosis not present

## 2020-12-06 DIAGNOSIS — Z9889 Other specified postprocedural states: Secondary | ICD-10-CM | POA: Diagnosis not present

## 2020-12-06 DIAGNOSIS — M79604 Pain in right leg: Secondary | ICD-10-CM | POA: Diagnosis not present

## 2020-12-06 DIAGNOSIS — Z8781 Personal history of (healed) traumatic fracture: Secondary | ICD-10-CM | POA: Diagnosis not present

## 2020-12-13 DIAGNOSIS — M79604 Pain in right leg: Secondary | ICD-10-CM | POA: Diagnosis not present

## 2020-12-13 DIAGNOSIS — Z9889 Other specified postprocedural states: Secondary | ICD-10-CM | POA: Diagnosis not present

## 2020-12-13 DIAGNOSIS — Z8781 Personal history of (healed) traumatic fracture: Secondary | ICD-10-CM | POA: Diagnosis not present

## 2020-12-16 DIAGNOSIS — M79604 Pain in right leg: Secondary | ICD-10-CM | POA: Diagnosis not present

## 2020-12-16 DIAGNOSIS — Z9889 Other specified postprocedural states: Secondary | ICD-10-CM | POA: Diagnosis not present

## 2020-12-16 DIAGNOSIS — Z8781 Personal history of (healed) traumatic fracture: Secondary | ICD-10-CM | POA: Diagnosis not present

## 2020-12-19 DIAGNOSIS — M79604 Pain in right leg: Secondary | ICD-10-CM | POA: Diagnosis not present

## 2020-12-19 DIAGNOSIS — Z9889 Other specified postprocedural states: Secondary | ICD-10-CM | POA: Diagnosis not present

## 2020-12-19 DIAGNOSIS — Z8781 Personal history of (healed) traumatic fracture: Secondary | ICD-10-CM | POA: Diagnosis not present

## 2020-12-23 DIAGNOSIS — M79604 Pain in right leg: Secondary | ICD-10-CM | POA: Diagnosis not present

## 2020-12-23 DIAGNOSIS — Z8781 Personal history of (healed) traumatic fracture: Secondary | ICD-10-CM | POA: Diagnosis not present

## 2020-12-23 DIAGNOSIS — Z9889 Other specified postprocedural states: Secondary | ICD-10-CM | POA: Diagnosis not present

## 2020-12-26 DIAGNOSIS — Z9889 Other specified postprocedural states: Secondary | ICD-10-CM | POA: Diagnosis not present

## 2020-12-26 DIAGNOSIS — Z8781 Personal history of (healed) traumatic fracture: Secondary | ICD-10-CM | POA: Diagnosis not present

## 2020-12-26 DIAGNOSIS — M79604 Pain in right leg: Secondary | ICD-10-CM | POA: Diagnosis not present

## 2021-01-04 DIAGNOSIS — N1831 Chronic kidney disease, stage 3a: Secondary | ICD-10-CM | POA: Diagnosis not present

## 2021-01-11 DIAGNOSIS — N184 Chronic kidney disease, stage 4 (severe): Secondary | ICD-10-CM | POA: Diagnosis not present

## 2021-01-11 DIAGNOSIS — I2510 Atherosclerotic heart disease of native coronary artery without angina pectoris: Secondary | ICD-10-CM | POA: Diagnosis not present

## 2021-01-11 DIAGNOSIS — Z125 Encounter for screening for malignant neoplasm of prostate: Secondary | ICD-10-CM | POA: Diagnosis not present

## 2021-01-11 DIAGNOSIS — K219 Gastro-esophageal reflux disease without esophagitis: Secondary | ICD-10-CM | POA: Diagnosis not present

## 2021-01-11 DIAGNOSIS — I10 Essential (primary) hypertension: Secondary | ICD-10-CM | POA: Diagnosis not present

## 2021-01-11 DIAGNOSIS — I509 Heart failure, unspecified: Secondary | ICD-10-CM | POA: Diagnosis not present

## 2021-01-11 DIAGNOSIS — E782 Mixed hyperlipidemia: Secondary | ICD-10-CM | POA: Diagnosis not present

## 2021-01-11 DIAGNOSIS — F419 Anxiety disorder, unspecified: Secondary | ICD-10-CM | POA: Diagnosis not present

## 2021-01-16 DIAGNOSIS — J90 Pleural effusion, not elsewhere classified: Secondary | ICD-10-CM | POA: Diagnosis not present

## 2021-01-16 DIAGNOSIS — Z8679 Personal history of other diseases of the circulatory system: Secondary | ICD-10-CM | POA: Diagnosis not present

## 2021-01-16 DIAGNOSIS — J189 Pneumonia, unspecified organism: Secondary | ICD-10-CM | POA: Diagnosis not present

## 2021-01-16 DIAGNOSIS — R0600 Dyspnea, unspecified: Secondary | ICD-10-CM | POA: Diagnosis not present

## 2021-01-17 DIAGNOSIS — J90 Pleural effusion, not elsewhere classified: Secondary | ICD-10-CM | POA: Diagnosis not present

## 2021-01-20 DIAGNOSIS — I2510 Atherosclerotic heart disease of native coronary artery without angina pectoris: Secondary | ICD-10-CM | POA: Diagnosis not present

## 2021-01-20 DIAGNOSIS — R918 Other nonspecific abnormal finding of lung field: Secondary | ICD-10-CM | POA: Diagnosis not present

## 2021-01-20 DIAGNOSIS — J986 Disorders of diaphragm: Secondary | ICD-10-CM | POA: Diagnosis not present

## 2021-01-20 DIAGNOSIS — J90 Pleural effusion, not elsewhere classified: Secondary | ICD-10-CM | POA: Diagnosis not present

## 2021-01-20 DIAGNOSIS — I509 Heart failure, unspecified: Secondary | ICD-10-CM | POA: Diagnosis not present

## 2021-01-20 DIAGNOSIS — R0602 Shortness of breath: Secondary | ICD-10-CM | POA: Diagnosis not present

## 2021-01-23 DIAGNOSIS — J90 Pleural effusion, not elsewhere classified: Secondary | ICD-10-CM | POA: Diagnosis not present

## 2021-01-23 DIAGNOSIS — I5040 Unspecified combined systolic (congestive) and diastolic (congestive) heart failure: Secondary | ICD-10-CM | POA: Diagnosis not present

## 2021-01-23 DIAGNOSIS — R0600 Dyspnea, unspecified: Secondary | ICD-10-CM | POA: Diagnosis not present

## 2021-01-24 ENCOUNTER — Other Ambulatory Visit: Payer: Self-pay | Admitting: Specialist

## 2021-01-24 DIAGNOSIS — R0600 Dyspnea, unspecified: Secondary | ICD-10-CM

## 2021-01-24 DIAGNOSIS — R0609 Other forms of dyspnea: Secondary | ICD-10-CM

## 2021-01-24 DIAGNOSIS — J90 Pleural effusion, not elsewhere classified: Secondary | ICD-10-CM

## 2021-01-25 ENCOUNTER — Other Ambulatory Visit: Payer: Self-pay

## 2021-01-25 ENCOUNTER — Ambulatory Visit
Admission: RE | Admit: 2021-01-25 | Discharge: 2021-01-25 | Disposition: A | Discharge status: Home / Self Care | Payer: PPO | Source: Ambulatory Visit | Attending: Specialist | Admitting: Specialist

## 2021-01-25 ENCOUNTER — Ambulatory Visit
Admission: RE | Admit: 2021-01-25 | Discharge: 2021-01-25 | Disposition: A | Discharge status: Home / Self Care | Payer: PPO | Source: Ambulatory Visit | Attending: Interventional Radiology | Admitting: Interventional Radiology

## 2021-01-25 ENCOUNTER — Other Ambulatory Visit: Payer: Self-pay | Admitting: Interventional Radiology

## 2021-01-25 DIAGNOSIS — I509 Heart failure, unspecified: Secondary | ICD-10-CM | POA: Diagnosis not present

## 2021-01-25 DIAGNOSIS — Z9889 Other specified postprocedural states: Secondary | ICD-10-CM

## 2021-01-25 DIAGNOSIS — J90 Pleural effusion, not elsewhere classified: Secondary | ICD-10-CM | POA: Diagnosis not present

## 2021-01-25 DIAGNOSIS — R0609 Other forms of dyspnea: Secondary | ICD-10-CM

## 2021-01-25 DIAGNOSIS — R0600 Dyspnea, unspecified: Secondary | ICD-10-CM | POA: Insufficient documentation

## 2021-01-25 DIAGNOSIS — I5020 Unspecified systolic (congestive) heart failure: Secondary | ICD-10-CM | POA: Diagnosis not present

## 2021-01-25 LAB — PROTEIN, PLEURAL OR PERITONEAL FLUID: Total protein, fluid: <3 g/dL

## 2021-01-25 LAB — LACTATE DEHYDROGENASE, PLEURAL OR PERITONEAL FLUID: LD, Fluid: 97 U/L — ABNORMAL HIGH (ref 3–23)

## 2021-01-25 LAB — BODY FLUID CELL COUNT WITH DIFFERENTIAL
Eos, Fluid: 1 %
Lymphs, Fluid: 86 %
Monocyte-Macrophage-Serous Fluid: 11 %
Neutrophil Count, Fluid: 2 %
Total Nucleated Cell Count, Fluid: 374 cu mm

## 2021-01-25 LAB — GLUCOSE, PLEURAL OR PERITONEAL FLUID: Glucose, Fluid: 75 mg/dL

## 2021-01-25 LAB — AMYLASE, PLEURAL OR PERITONEAL FLUID: Amylase, Fluid: 31 U/L

## 2021-01-25 NOTE — Procedures (Signed)
Interventional Radiology Procedure Note  Procedure: [name] LEFT THORA    Complications: None  Estimated Blood Loss:  0  Findings: 500CC BLOOD TINGED PLEURAL FLD REMOVED LABS SENT CXR P    M. Daryll Brod, MD

## 2021-01-26 ENCOUNTER — Other Ambulatory Visit: Payer: Self-pay | Admitting: Specialist

## 2021-01-26 DIAGNOSIS — J90 Pleural effusion, not elsewhere classified: Secondary | ICD-10-CM

## 2021-01-26 LAB — PH, BODY FLUID: pH, Body Fluid: 7.6

## 2021-01-26 LAB — PROTEIN, BODY FLUID (OTHER): Total Protein, Body Fluid Other: 1.3 g/dL

## 2021-01-27 ENCOUNTER — Ambulatory Visit
Admission: RE | Admit: 2021-01-27 | Discharge: 2021-01-27 | Disposition: A | Discharge status: Home / Self Care | Payer: PPO | Source: Ambulatory Visit | Attending: Student | Admitting: Student

## 2021-01-27 ENCOUNTER — Ambulatory Visit: Admission: RE | Admit: 2021-01-27 | Payer: PPO | Source: Ambulatory Visit

## 2021-01-27 ENCOUNTER — Other Ambulatory Visit: Payer: Self-pay

## 2021-01-27 ENCOUNTER — Other Ambulatory Visit: Payer: Self-pay | Admitting: Student

## 2021-01-27 ENCOUNTER — Ambulatory Visit
Admission: RE | Admit: 2021-01-27 | Discharge: 2021-01-27 | Disposition: A | Discharge status: Home / Self Care | Payer: PPO | Source: Ambulatory Visit | Attending: Specialist | Admitting: Specialist

## 2021-01-27 DIAGNOSIS — Z9889 Other specified postprocedural states: Secondary | ICD-10-CM

## 2021-01-27 DIAGNOSIS — J90 Pleural effusion, not elsewhere classified: Secondary | ICD-10-CM

## 2021-01-27 DIAGNOSIS — Z7689 Persons encountering health services in other specified circumstances: Secondary | ICD-10-CM | POA: Diagnosis not present

## 2021-01-27 DIAGNOSIS — I509 Heart failure, unspecified: Secondary | ICD-10-CM | POA: Diagnosis not present

## 2021-01-27 DIAGNOSIS — J9811 Atelectasis: Secondary | ICD-10-CM | POA: Diagnosis not present

## 2021-01-27 LAB — AMYLASE, PLEURAL OR PERITONEAL FLUID: Amylase, Fluid: 25 U/L

## 2021-01-27 LAB — GLUCOSE, PLEURAL OR PERITONEAL FLUID: Glucose, Fluid: 65 mg/dL

## 2021-01-27 LAB — ACID FAST SMEAR (AFB, MYCOBACTERIA): Acid Fast Smear: NEGATIVE

## 2021-01-27 LAB — BODY FLUID CELL COUNT WITH DIFFERENTIAL
Eos, Fluid: 3 %
Lymphs, Fluid: 93 %
Monocyte-Macrophage-Serous Fluid: 4 %
Neutrophil Count, Fluid: 0 %
Total Nucleated Cell Count, Fluid: 307 cu mm

## 2021-01-27 LAB — CYTOLOGY - NON PAP

## 2021-01-27 LAB — LACTATE DEHYDROGENASE, PLEURAL OR PERITONEAL FLUID: LD, Fluid: 76 U/L — ABNORMAL HIGH (ref 3–23)

## 2021-01-27 LAB — PROTEIN, PLEURAL OR PERITONEAL FLUID: Total protein, fluid: <3 g/dL

## 2021-01-27 NOTE — Procedures (Signed)
PROCEDURE SUMMARY:  Successful US guided right thoracentesis. Yielded 400 ml of amber-colored fluid. Pt tolerated procedure well. No immediate complications.  Specimen sent for labs. CXR ordered; no post-procedure pneumothorax identified.   EBL < 2 mL  Theresa Duty, NP 01/27/2021 9:38 AM

## 2021-01-28 LAB — CHOLESTEROL, BODY FLUID: Cholesterol, Fluid: 11 mg/dL

## 2021-01-29 LAB — BODY FLUID CULTURE W GRAM STAIN: Culture: NO GROWTH

## 2021-01-29 LAB — PROTEIN, BODY FLUID (OTHER): Total Protein, Body Fluid Other: 1.2 g/dL

## 2021-01-29 LAB — ACID FAST SMEAR (AFB, MYCOBACTERIA): Acid Fast Smear: NEGATIVE

## 2021-01-30 LAB — BODY FLUID CULTURE W GRAM STAIN: Culture: NO GROWTH

## 2021-01-30 LAB — CYTOLOGY - NON PAP

## 2021-01-31 ENCOUNTER — Inpatient Hospital Stay
Admission: EM | Admit: 2021-01-31 | Discharge: 2021-02-08 | DRG: 291 | Disposition: A | Discharge status: Home Health | Payer: PPO | Attending: Obstetrics and Gynecology | Admitting: Obstetrics and Gynecology

## 2021-01-31 ENCOUNTER — Encounter: Payer: Self-pay | Admitting: Internal Medicine

## 2021-01-31 ENCOUNTER — Other Ambulatory Visit: Payer: Self-pay

## 2021-01-31 ENCOUNTER — Emergency Department: Payer: PPO

## 2021-01-31 ENCOUNTER — Inpatient Hospital Stay
Admit: 2021-01-31 | Discharge: 2021-01-31 | Disposition: A | Discharge status: Home / Self Care | Payer: PPO | Attending: Internal Medicine | Admitting: Internal Medicine

## 2021-01-31 DIAGNOSIS — J9601 Acute respiratory failure with hypoxia: Secondary | ICD-10-CM | POA: Diagnosis not present

## 2021-01-31 DIAGNOSIS — Z96653 Presence of artificial knee joint, bilateral: Secondary | ICD-10-CM | POA: Diagnosis present

## 2021-01-31 DIAGNOSIS — N184 Chronic kidney disease, stage 4 (severe): Secondary | ICD-10-CM | POA: Diagnosis present

## 2021-01-31 DIAGNOSIS — J9811 Atelectasis: Secondary | ICD-10-CM | POA: Diagnosis not present

## 2021-01-31 DIAGNOSIS — I083 Combined rheumatic disorders of mitral, aortic and tricuspid valves: Secondary | ICD-10-CM | POA: Diagnosis present

## 2021-01-31 DIAGNOSIS — E876 Hypokalemia: Secondary | ICD-10-CM | POA: Diagnosis not present

## 2021-01-31 DIAGNOSIS — J9690 Respiratory failure, unspecified, unspecified whether with hypoxia or hypercapnia: Secondary | ICD-10-CM | POA: Diagnosis not present

## 2021-01-31 DIAGNOSIS — L89150 Pressure ulcer of sacral region, unstageable: Secondary | ICD-10-CM | POA: Diagnosis present

## 2021-01-31 DIAGNOSIS — E785 Hyperlipidemia, unspecified: Secondary | ICD-10-CM | POA: Diagnosis present

## 2021-01-31 DIAGNOSIS — D631 Anemia in chronic kidney disease: Secondary | ICD-10-CM | POA: Diagnosis present

## 2021-01-31 DIAGNOSIS — Z7189 Other specified counseling: Secondary | ICD-10-CM | POA: Diagnosis not present

## 2021-01-31 DIAGNOSIS — Z87891 Personal history of nicotine dependence: Secondary | ICD-10-CM

## 2021-01-31 DIAGNOSIS — Z951 Presence of aortocoronary bypass graft: Secondary | ICD-10-CM

## 2021-01-31 DIAGNOSIS — I5023 Acute on chronic systolic (congestive) heart failure: Secondary | ICD-10-CM | POA: Diagnosis present

## 2021-01-31 DIAGNOSIS — I959 Hypotension, unspecified: Secondary | ICD-10-CM | POA: Diagnosis not present

## 2021-01-31 DIAGNOSIS — I509 Heart failure, unspecified: Secondary | ICD-10-CM

## 2021-01-31 DIAGNOSIS — Y92239 Unspecified place in hospital as the place of occurrence of the external cause: Secondary | ICD-10-CM | POA: Diagnosis not present

## 2021-01-31 DIAGNOSIS — I130 Hypertensive heart and chronic kidney disease with heart failure and stage 1 through stage 4 chronic kidney disease, or unspecified chronic kidney disease: Principal | ICD-10-CM | POA: Diagnosis present

## 2021-01-31 DIAGNOSIS — R7989 Other specified abnormal findings of blood chemistry: Secondary | ICD-10-CM | POA: Diagnosis not present

## 2021-01-31 DIAGNOSIS — J90 Pleural effusion, not elsewhere classified: Secondary | ICD-10-CM | POA: Diagnosis not present

## 2021-01-31 DIAGNOSIS — Z66 Do not resuscitate: Secondary | ICD-10-CM | POA: Diagnosis not present

## 2021-01-31 DIAGNOSIS — F419 Anxiety disorder, unspecified: Secondary | ICD-10-CM | POA: Diagnosis present

## 2021-01-31 DIAGNOSIS — Z79899 Other long term (current) drug therapy: Secondary | ICD-10-CM | POA: Diagnosis not present

## 2021-01-31 DIAGNOSIS — R000 Tachycardia, unspecified: Secondary | ICD-10-CM | POA: Diagnosis not present

## 2021-01-31 DIAGNOSIS — I110 Hypertensive heart disease with heart failure: Secondary | ICD-10-CM | POA: Diagnosis not present

## 2021-01-31 DIAGNOSIS — Z9049 Acquired absence of other specified parts of digestive tract: Secondary | ICD-10-CM | POA: Diagnosis not present

## 2021-01-31 DIAGNOSIS — R0902 Hypoxemia: Secondary | ICD-10-CM | POA: Diagnosis not present

## 2021-01-31 DIAGNOSIS — I2510 Atherosclerotic heart disease of native coronary artery without angina pectoris: Secondary | ICD-10-CM | POA: Diagnosis present

## 2021-01-31 DIAGNOSIS — Z885 Allergy status to narcotic agent status: Secondary | ICD-10-CM

## 2021-01-31 DIAGNOSIS — E7800 Pure hypercholesterolemia, unspecified: Secondary | ICD-10-CM | POA: Diagnosis present

## 2021-01-31 DIAGNOSIS — R0689 Other abnormalities of breathing: Secondary | ICD-10-CM | POA: Diagnosis not present

## 2021-01-31 DIAGNOSIS — R0602 Shortness of breath: Secondary | ICD-10-CM | POA: Diagnosis not present

## 2021-01-31 DIAGNOSIS — I454 Nonspecific intraventricular block: Secondary | ICD-10-CM | POA: Diagnosis present

## 2021-01-31 DIAGNOSIS — Z79890 Hormone replacement therapy (postmenopausal): Secondary | ICD-10-CM

## 2021-01-31 DIAGNOSIS — Z888 Allergy status to other drugs, medicaments and biological substances status: Secondary | ICD-10-CM

## 2021-01-31 DIAGNOSIS — Z7982 Long term (current) use of aspirin: Secondary | ICD-10-CM | POA: Diagnosis not present

## 2021-01-31 DIAGNOSIS — R069 Unspecified abnormalities of breathing: Secondary | ICD-10-CM | POA: Diagnosis not present

## 2021-01-31 DIAGNOSIS — L89302 Pressure ulcer of unspecified buttock, stage 2: Secondary | ICD-10-CM

## 2021-01-31 DIAGNOSIS — Z515 Encounter for palliative care: Secondary | ICD-10-CM

## 2021-01-31 DIAGNOSIS — I472 Ventricular tachycardia: Secondary | ICD-10-CM | POA: Diagnosis not present

## 2021-01-31 DIAGNOSIS — I420 Dilated cardiomyopathy: Secondary | ICD-10-CM | POA: Diagnosis present

## 2021-01-31 DIAGNOSIS — Z953 Presence of xenogenic heart valve: Secondary | ICD-10-CM | POA: Diagnosis not present

## 2021-01-31 DIAGNOSIS — I4891 Unspecified atrial fibrillation: Secondary | ICD-10-CM | POA: Diagnosis present

## 2021-01-31 DIAGNOSIS — R042 Hemoptysis: Secondary | ICD-10-CM | POA: Diagnosis not present

## 2021-01-31 DIAGNOSIS — Z20822 Contact with and (suspected) exposure to covid-19: Secondary | ICD-10-CM | POA: Diagnosis not present

## 2021-01-31 DIAGNOSIS — E8770 Fluid overload, unspecified: Secondary | ICD-10-CM | POA: Diagnosis not present

## 2021-01-31 DIAGNOSIS — J310 Chronic rhinitis: Secondary | ICD-10-CM | POA: Diagnosis present

## 2021-01-31 DIAGNOSIS — I517 Cardiomegaly: Secondary | ICD-10-CM | POA: Diagnosis not present

## 2021-01-31 DIAGNOSIS — E871 Hypo-osmolality and hyponatremia: Secondary | ICD-10-CM | POA: Diagnosis not present

## 2021-01-31 DIAGNOSIS — I10 Essential (primary) hypertension: Secondary | ICD-10-CM | POA: Diagnosis not present

## 2021-01-31 DIAGNOSIS — K219 Gastro-esophageal reflux disease without esophagitis: Secondary | ICD-10-CM | POA: Diagnosis present

## 2021-01-31 DIAGNOSIS — D472 Monoclonal gammopathy: Secondary | ICD-10-CM | POA: Diagnosis present

## 2021-01-31 DIAGNOSIS — N1832 Chronic kidney disease, stage 3b: Secondary | ICD-10-CM | POA: Diagnosis not present

## 2021-01-31 DIAGNOSIS — N179 Acute kidney failure, unspecified: Secondary | ICD-10-CM | POA: Diagnosis not present

## 2021-01-31 DIAGNOSIS — T502X5A Adverse effect of carbonic-anhydrase inhibitors, benzothiadiazides and other diuretics, initial encounter: Secondary | ICD-10-CM | POA: Diagnosis not present

## 2021-01-31 DIAGNOSIS — Z91041 Radiographic dye allergy status: Secondary | ICD-10-CM

## 2021-01-31 DIAGNOSIS — J811 Chronic pulmonary edema: Secondary | ICD-10-CM | POA: Diagnosis not present

## 2021-01-31 LAB — COMPREHENSIVE METABOLIC PANEL
ALT: 77 U/L — ABNORMAL HIGH (ref 0–44)
AST: 92 U/L — ABNORMAL HIGH (ref 15–41)
Albumin: 3.7 g/dL (ref 3.5–5.0)
Alkaline Phosphatase: 137 U/L — ABNORMAL HIGH (ref 38–126)
Anion gap: 19 — ABNORMAL HIGH (ref 5–15)
BUN: 91 mg/dL — ABNORMAL HIGH (ref 8–23)
CO2: 25 mmol/L (ref 22–32)
Calcium: 9.3 mg/dL (ref 8.9–10.3)
Chloride: 94 mmol/L — ABNORMAL LOW (ref 98–111)
Creatinine, Ser: 3.43 mg/dL — ABNORMAL HIGH (ref 0.61–1.24)
GFR, Estimated: 17 mL/min — ABNORMAL LOW (ref >60)
Glucose, Bld: 117 mg/dL — ABNORMAL HIGH (ref 70–99)
Potassium: 3.7 mmol/L (ref 3.5–5.1)
Sodium: 138 mmol/L (ref 135–145)
Total Bilirubin: 3 mg/dL — ABNORMAL HIGH (ref 0.3–1.2)
Total Protein: 6.9 g/dL (ref 6.5–8.1)

## 2021-01-31 LAB — CBC WITH DIFFERENTIAL/PLATELET
Abs Immature Granulocytes: 0.03 10*3/uL (ref 0.00–0.07)
Basophils Absolute: 0 10*3/uL (ref 0.0–0.1)
Basophils Relative: 0 %
Eosinophils Absolute: 0 10*3/uL (ref 0.0–0.5)
Eosinophils Relative: 0 %
HCT: 42.2 % (ref 39.0–52.0)
Hemoglobin: 14.6 g/dL (ref 13.0–17.0)
Immature Granulocytes: 0 %
Lymphocytes Relative: 12 %
Lymphs Abs: 0.9 10*3/uL (ref 0.7–4.0)
MCH: 31.7 pg (ref 26.0–34.0)
MCHC: 34.6 g/dL (ref 30.0–36.0)
MCV: 91.5 fL (ref 80.0–100.0)
Monocytes Absolute: 1 10*3/uL (ref 0.1–1.0)
Monocytes Relative: 13 %
Neutro Abs: 5.7 10*3/uL (ref 1.7–7.7)
Neutrophils Relative %: 75 %
Platelets: 210 10*3/uL (ref 150–400)
RBC: 4.61 MIL/uL (ref 4.22–5.81)
RDW: 15.7 % — ABNORMAL HIGH (ref 11.5–15.5)
WBC: 7.7 10*3/uL (ref 4.0–10.5)
nRBC: 0 % (ref 0.0–0.2)

## 2021-01-31 LAB — TSH: TSH: 7.914 u[IU]/mL — ABNORMAL HIGH (ref 0.350–4.500)

## 2021-01-31 LAB — TROPONIN I (HIGH SENSITIVITY)
Troponin I (High Sensitivity): 149 ng/L (ref <18)
Troponin I (High Sensitivity): 137 ng/L (ref <18)

## 2021-01-31 LAB — RESP PANEL BY RT-PCR (FLU A&B, COVID) ARPGX2
Influenza A by PCR: NEGATIVE
Influenza B by PCR: NEGATIVE
SARS Coronavirus 2 by RT PCR: NEGATIVE

## 2021-01-31 LAB — BRAIN NATRIURETIC PEPTIDE: B Natriuretic Peptide: >4500 pg/mL — ABNORMAL HIGH (ref 0.0–100.0)

## 2021-01-31 MED ORDER — MELATONIN 5 MG PO TABS
2.5000 mg | ORAL_TABLET | Freq: Every evening | ORAL | Status: DC | PRN
  Administered 2021-01-31 – 2021-02-07 (×3): 2.5 mg via ORAL
  Filled 2021-01-31 (×3): qty 1

## 2021-01-31 MED ORDER — ONDANSETRON HCL 4 MG/2ML IJ SOLN: 4.0000 mg | Freq: Four times a day (QID) | INTRAMUSCULAR | Status: DC | PRN

## 2021-01-31 MED ORDER — PRAVASTATIN SODIUM 20 MG PO TABS
20.0000 mg | ORAL_TABLET | Freq: Every evening | ORAL | Status: DC
  Administered 2021-01-31: 20 mg via ORAL
  Filled 2021-01-31 (×2): qty 1

## 2021-01-31 MED ORDER — SODIUM CHLORIDE 0.9% FLUSH
3.0000 mL | Freq: Two times a day (BID) | INTRAVENOUS | Status: DC
  Administered 2021-01-31 – 2021-02-07 (×15): 3 mL via INTRAVENOUS

## 2021-01-31 MED ORDER — HEPARIN SODIUM (PORCINE) 5000 UNIT/ML IJ SOLN
5000.0000 [IU] | Freq: Three times a day (TID) | INTRAMUSCULAR | Status: DC
  Administered 2021-01-31 – 2021-02-07 (×18): 5000 [IU] via SUBCUTANEOUS
  Filled 2021-01-31 (×18): qty 1

## 2021-01-31 MED ORDER — FUROSEMIDE 10 MG/ML IJ SOLN
80.0000 mg | Freq: Once | INTRAMUSCULAR | Status: AC
  Administered 2021-01-31: 80 mg via INTRAVENOUS
  Filled 2021-01-31: qty 8

## 2021-01-31 MED ORDER — SODIUM CHLORIDE 0.9% FLUSH: 3.0000 mL | INTRAVENOUS | Status: DC | PRN

## 2021-01-31 MED ORDER — ASPIRIN EC 81 MG PO TBEC
81.0000 mg | DELAYED_RELEASE_TABLET | Freq: Two times a day (BID) | ORAL | Status: DC
  Administered 2021-01-31 – 2021-02-08 (×16): 81 mg via ORAL
  Filled 2021-01-31 (×16): qty 1

## 2021-01-31 MED ORDER — ACETAMINOPHEN 325 MG PO TABS
650.0000 mg | ORAL_TABLET | ORAL | Status: DC | PRN
  Administered 2021-02-03: 650 mg via ORAL
  Filled 2021-01-31: qty 2

## 2021-01-31 MED ORDER — SODIUM CHLORIDE 0.9 % IV SOLN
250.0000 mL | INTRAVENOUS | Status: DC | PRN
  Administered 2021-02-03: 250 mL via INTRAVENOUS

## 2021-01-31 MED ORDER — FUROSEMIDE 10 MG/ML IJ SOLN
40.0000 mg | Freq: Two times a day (BID) | INTRAMUSCULAR | Status: DC
  Administered 2021-01-31 – 2021-02-01 (×3): 40 mg via INTRAVENOUS
  Filled 2021-01-31 (×3): qty 4

## 2021-01-31 MED ORDER — METOPROLOL TARTRATE 5 MG/5ML IV SOLN
5.0000 mg | INTRAVENOUS | Status: DC | PRN
  Administered 2021-01-31: 5 mg via INTRAVENOUS
  Filled 2021-01-31: qty 5

## 2021-01-31 MED ORDER — ALPRAZOLAM 0.25 MG PO TABS: 0.2500 mg | ORAL_TABLET | Freq: Two times a day (BID) | ORAL | Status: DC | PRN

## 2021-01-31 MED ORDER — FLUTICASONE PROPIONATE 50 MCG/ACT NA SUSP
1.0000 | Freq: Every day | NASAL | Status: DC
  Administered 2021-02-01 – 2021-02-08 (×8): 1 via NASAL
  Filled 2021-01-31: qty 16

## 2021-01-31 MED ORDER — ALBUTEROL SULFATE HFA 108 (90 BASE) MCG/ACT IN AERS
2.0000 | INHALATION_SPRAY | Freq: Four times a day (QID) | RESPIRATORY_TRACT | Status: DC | PRN
  Filled 2021-01-31 (×2): qty 6.7

## 2021-01-31 MED ORDER — SODIUM BICARBONATE 650 MG PO TABS
1300.0000 mg | ORAL_TABLET | Freq: Two times a day (BID) | ORAL | Status: DC
  Administered 2021-01-31 – 2021-02-06 (×12): 1300 mg via ORAL
  Filled 2021-01-31 (×13): qty 2

## 2021-01-31 MED ORDER — MAGNESIUM SULFATE 2 GM/50ML IV SOLN
2.0000 g | Freq: Once | INTRAVENOUS | Status: AC
  Administered 2021-01-31: 2 g via INTRAVENOUS
  Filled 2021-01-31: qty 50

## 2021-01-31 NOTE — ED Notes (Signed)
Hospitalist Acheampong at bedside speaking with family

## 2021-01-31 NOTE — ED Notes (Signed)
Transport requested , Respiratory Therapy and ER RN at bedside for transport.

## 2021-01-31 NOTE — Consult Note (Signed)
Cardiology Consultation Note    Patient ID: [name], MRN: [id number], DOB/AGE: [birth year] 87 y.o. Admit date: 01/31/2021   Date of Consult: 01/31/2021 Primary Physician: Baxter Hire, MD Primary Cardiologist: Dr. Nehemiah Massed  Chief Complaint: Shortness of breath Reason for Consultation: Tachycardia/troponin Requesting MD: Letitia Neri  HPI: [name] is a 87 y.o. male with history of chronic systolic heart failure, chronic kidney disease stage IV, coronary artery disease status post coronary artery bypass grafting as well as tissue prosthesis aortic valve replacement, hypertension who presented to emergency room with increasing shortness of breath and was noted to have sinus tachycardia.  Troponin was drawn and was mildly elevated.  EKG showed atrial fibrillation with variable ventricular response with intermittent bundle branch block.  Troponin was drawn which showed a value of 149.  Somewhat elevated pro troponins 4 months ago.  BNP is greater than 4500.  Was 3448 4 months ago.  Acute on chronic renal insufficiency with a creatinine of 3.43 BUN of 91 increased from 2.314 months ago.  Baseline appears to be around 2.  As an outpatient he is treated with enteric-coated aspirin, pravastatin 20 mg daily, torsemide 40 mg daily, and states he has been compliant with his medications.  Chest x-ray revealed pulmonary vascular congestion.  Is of note he had a thoracentesis done 4 days ago.  He had 400 cc of pleural fluid removed from the right lung space. Review of previous ekgs suggest at least intermittant nonsustained afib. Has eipsodes of wide complex beats which are irregular and appear to be abbarant  Conducted beats. No evidence of sustained VT.   Past Medical History:  Diagnosis Date  . Anemia   . Aortic aneurysm ([place])   . Aortic aneurysm ([place])   . Arthritis   . CKD (chronic kidney disease)    ALSO LEFT RENAL MASS  . Coronary artery disease   . Diverticulosis    WITH RUPTURE   . GERD (gastroesophageal reflux disease)   . History of hiatal hernia   . Hypercholesteremia   . Hypertension   . MGUS (monoclonal gammopathy of unknown significance)   . MGUS (monoclonal gammopathy of unknown significance)       Surgical History:  Past Surgical History:  Procedure Laterality Date  . AORTIC VALVE REPLACEMENT    . bipass    . CARDIAC VALVE REPLACEMENT     pig valve  . CHOLECYSTECTOMY    . COLON SURGERY    . CORONARY ANGIOPLASTY    . CORONARY ARTERY BYPASS GRAFT    . ESOPHAGOGASTRODUODENOSCOPY (EGD) WITH PROPOFOL N/A 02/07/2015   Procedure: ESOPHAGOGASTRODUODENOSCOPY (EGD) WITH PROPOFOL;  Surgeon: Hulen Luster, MD;  Location: [facility] ENDOSCOPY;  Service: Gastroenterology;  Laterality: N/A;  . EYE SURGERY    . HERNIA REPAIR    . INTRAMEDULLARY (IM) NAIL INTERTROCHANTERIC Right 05/12/2020   Procedure: INTRAMEDULLARY (IM) NAIL INTERTROCHANTRIC;  Surgeon: Lovell Sheehan, MD;  Location: [facility] ORS;  Service: Orthopedics;  Laterality: Right;  . JOINT REPLACEMENT     left knee x4  . KNEE ARTHROPLASTY Right 04/02/2016   Procedure: COMPUTER ASSISTED TOTAL KNEE ARTHROPLASTY;  Surgeon: Dereck Leep, MD;  Location: [facility] ORS;  Service: Orthopedics;  Laterality: Right;  . ORIF FEMUR FRACTURE Right 08/08/2020   Procedure: OPEN REDUCTION INTERNAL FIXATION (ORIF) DISTAL FEMUR FRACTURE;  Surgeon: Lovell Sheehan, MD;  Location: [facility] ORS;  Service: Orthopedics;  Laterality: Right;     Home Meds: Prior to Admission medications   Medication  Sig Start Date End Date Taking? Authorizing Provider  ALPRAZolam (XANAX) 0.25 MG tablet Take 0.25 mg by mouth 2 (two) times daily as needed. 08/26/20   [provider]  aspirin EC 81 MG tablet Take 1 tablet (81 mg total) by mouth 2 (two) times daily. Swallow whole. 08/10/20   Donne Hazel, MD  Melatonin 3 MG TABS Take 1.5 mg by mouth at bedtime as needed (sleep).    [provider]  pravastatin (PRAVACHOL) 20 MG tablet Take 20 mg by  mouth every evening.    [provider]  sodium bicarbonate 650 MG tablet Take 1,300 mg by mouth 2 (two) times daily.    [provider]  torsemide (DEMADEX) 20 MG tablet Take 2 tablets (40 mg total) by mouth daily. 09/10/20   Lorella Nimrod, MD  traMADol (ULTRAM) 50 MG tablet Take 50 mg by mouth every 4 (four) hours as needed. 08/16/20   [provider]    Inpatient Medications:     Allergies:  Allergies  Allergen Reactions  . Morphine Anaphylaxis  . Oxycodone Other (See Comments)    Hypotension and bradycardia  . Carvedilol     Dizziness and syncope   . Iodinated Diagnostic Agents Rash    Other reaction(s): Asthenia (finding), Other (qualifier value) paralyzed legs Other reaction(s): RASH   . Lisinopril Cough and Other (See Comments)    Social History   Socioeconomic History  . Marital status: Widowed    Spouse name: Not on file  . Number of children: Not on file  . Years of education: Not on file  . Highest education level: Not on file  Occupational History  . Not on file  Tobacco Use  . Smoking status: Former Research scientist (life sciences)  . Smokeless tobacco: Never Used  Substance and Sexual Activity  . Alcohol use: No  . Drug use: No  . Sexual activity: Not on file  Other Topics Concern  . Not on file  Social History Narrative  . Not on file   Social Determinants of Health   Financial Resource Strain: Not on file  Food Insecurity: Not on file  Transportation Needs: Not on file  Physical Activity: Not on file  Stress: Not on file  Social Connections: Not on file  Intimate Partner Violence: Not on file     Family History  Problem Relation Age of Onset  . Bladder Cancer Neg Hx   . Prostate cancer Neg Hx   . Kidney cancer Neg Hx      Review of Systems: A 12-system review of systems was performed and is negative except as noted in the HPI.  Labs: No results for input(s): CKTOTAL, CKMB, TROPONINI in the last 72 hours. Lab Results  Component  Value Date   WBC 7.7 01/31/2021   HGB 14.6 01/31/2021   HCT 42.2 01/31/2021   MCV 91.5 01/31/2021   PLT 210 01/31/2021    Recent Labs  Lab 01/31/21 1109  NA 138  K 3.7  CL 94*  CO2 25  BUN 91*  CREATININE 3.43*  CALCIUM 9.3  PROT 6.9  BILITOT 3.0*  ALKPHOS 137*  ALT 77*  AST 92*  GLUCOSE 117*   No results found for: CHOL, HDL, LDLCALC, TRIG No results found for: DDIMER  Radiology/Studies:  DG Chest 1 View  Result Date: 01/27/2021 CLINICAL DATA:  87 year old male with history of CHF status post left thoracentesis. EXAM: CHEST  1 VIEW COMPARISON:  01/25/2021 FINDINGS: Stable cardiomediastinal silhouette with unchanged moderate cardiomegaly.  Persistent but decreased blunting of the right costophrenic angle. Persistent passive in cicatricial atelectasis in the right lower lobe. Similar appearing left costophrenic angle blunting and mild retrocardiac opacities. No evidence pneumothorax. No new focal consolidations. Atherosclerotic calcification of the aortic arch. Median sternotomy wires remain in place and appear intact in addition to postsurgical changes after coronary artery bypass and aortic valve replacement. No acute osseous abnormality. IMPRESSION: Trace residual right pleural effusion after right thoracentesis. No evidence of pneumothorax. Electronically Signed   By: Ruthann Cancer MD   On: 01/27/2021 09:37   DG Chest Port 1 View  Result Date: 01/31/2021 CLINICAL DATA:  Shortness of breath. EXAM: PORTABLE CHEST 1 VIEW COMPARISON:  Chest x-ray dated Jan 27, 2021. FINDINGS: Stable cardiomegaly status post CABG and AVR. Unchanged pulmonary vascular congestion and interstitial thickening. Unchanged small bilateral pleural effusions and bibasilar atelectasis. No pneumothorax. No acute osseous abnormality. IMPRESSION: 1. Unchanged congestive heart failure. Electronically Signed   By: Titus Dubin M.D.   On: 01/31/2021 11:47   DG Chest Port 1 View  Result Date: 01/25/2021 CLINICAL  DATA:  CHF, status post left thoracentesis EXAM: PORTABLE CHEST 1 VIEW COMPARISON:  09/09/2020 FINDINGS: Improvement in the left effusion following thoracentesis. No pneumothorax. Small right effusion noted. Heart is enlarged with vascular congestion and basilar atelectasis versus scarring. Aorta atherosclerotic. Remote coronary bypass changes and cardiac valve replacement. Degenerative changes of the spine. Bones are osteopenic. IMPRESSION: No pneumothorax following left thoracentesis. Electronically Signed   By: Jerilynn Mages.  Shick M.D.   On: 01/25/2021 14:06   US THORACENTESIS ASP PLEURAL SPACE W/IMG GUIDE  Result Date: 01/27/2021 INDICATION: Patient with a history of heart failure and recurrent bilateral pleural effusions. Interventional radiology asked to perform a therapeutic and diagnostic thoracentesis. EXAM: ULTRASOUND GUIDED THORACENTESIS MEDICATIONS: % lidocaine 10 mL COMPLICATIONS: None immediate. PROCEDURE: An ultrasound guided thoracentesis was thoroughly discussed with the patient and questions answered. The benefits, risks, alternatives and complications were also discussed. The patient understands and wishes to proceed with the procedure. Written consent was obtained. Ultrasound was performed to localize and mark an adequate pocket of fluid in the right chest. The area was then prepped and draped in the normal sterile fashion. 1% Lidocaine was used for local anesthesia. Under ultrasound guidance a 6 Fr Safe-T-Centesis catheter was introduced. Thoracentesis was performed. The catheter was removed and a dressing applied. FINDINGS: A total of approximately 400 mL of amber-colored fluid was removed. Samples were sent to the laboratory as requested by the clinical team. IMPRESSION: Successful ultrasound guided right thoracentesis yielding 400 mL of pleural fluid. Read by: Soyla Dryer, NP Electronically Signed   By: Ruthann Cancer MD   On: 01/27/2021 09:41   US THORACENTESIS ASP PLEURAL SPACE W/IMG  GUIDE  Result Date: 01/25/2021 INDICATION: CHF, pleural effusions EXAM: ULTRASOUND GUIDED LEFT THORACENTESIS MEDICATIONS: 1% lidocaine local COMPLICATIONS: None immediate. PROCEDURE: An ultrasound guided thoracentesis was thoroughly discussed with the patient and questions answered. The benefits, risks, alternatives and complications were also discussed. The patient understands and wishes to proceed with the procedure. Written consent was obtained. Ultrasound was performed to localize and mark an adequate pocket of fluid in the left chest. The area was then prepped and draped in the normal sterile fashion. 1% Lidocaine was used for local anesthesia. Under ultrasound guidance a 6 Fr Safe-T-Centesis catheter was introduced. Thoracentesis was performed. The catheter was removed and a dressing applied. FINDINGS: A total of approximately 500 cc of blood tinged pleural fluid was removed. Samples were  sent to the laboratory as requested by the clinical team. IMPRESSION: Successful ultrasound guided left thoracentesis yielding 500 cc of pleural fluid. Electronically Signed   By: Jerilynn Mages.  Shick M.D.   On: 01/25/2021 14:04    Wt Readings from Last 3 Encounters:  01/31/21 72.6 kg  09/10/20 74.4 kg  08/09/20 76 kg    EKG: afib with variable vr with abbarant conduction   Physical Exam:  Blood pressure (!) 127/92, pulse (!) 58, temperature 97.7 F (36.5 C), temperature source Oral, resp. rate (!) 22, height 5\' 11"  (1.803 m), weight 72.6 kg, SpO2 100 %. Body mass index is 22.32 kg/m. General: Well developed, well nourished, in no acute distress. Head: Normocephalic, atraumatic, sclera non-icteric, no xanthomas, nares are without discharge.  Neck: Negative for carotid bruits. JVD not elevated. Lungs: Clear bilaterally to auscultation without wheezes, rales, or rhonchi. Breathing is unlabored. Heart: RRR with S1 S2. No murmurs, rubs, or gallops appreciated. Abdomen: Soft, non-tender, non-distended with normoactive  bowel sounds. No hepatomegaly. No rebound/guarding. No obvious abdominal masses. Msk:  Strength and tone appear normal for age. Extremities: No clubbing or cyanosis. No edema.  Distal pedal pulses are 2+ and equal bilaterally. Neuro: Alert and oriented X 3. No facial asymmetry. No focal deficit. Moves all extremities spontaneously. Psych:  Responds to questions appropriately with a normal affect.     Assessment and Plan  87 y.o. male with history of chronic systolic heart failure, chronic kidney disease stage IV, coronary artery disease status post coronary artery bypass grafting as well as tissue prosthesis aortic valve replacement, hypertension who presented to emergency room with increasing shortness of breath and was noted to have sinus tachycardia.  Troponin was drawn and was mildly elevated.  EKG showed atrial fibrillation with variable ventricular response with intermittent bundle branch block.  Troponin was drawn which showed a value of 149.  Somewhat elevated pro troponins 4 months ago.  BNP is greater than 4500.  Was 3448 4 months ago.  Acute on chronic renal insufficiency with a creatinine of 3.43 BUN of 91 increased from 2.314 months ago.  Baseline appears to be around 2.  As an outpatient he is treated with enteric-coated aspirin, pravastatin 20 mg daily, torsemide 40 mg daily, and states he has been compliant with his medications.  Chest x-ray revealed pulmonary vascular congestion.  Is of note he had a thoracentesis done 4 days ago.  He had 400 cc of pleural fluid removed from the right lung space.  1.  Arrhythmia-appears to be atrial fibrillation.  Very irregular underlying rhythm with baseline artifact limiting sensitivity however this appears to be most likely intermitant  A. fib with variable ventricular response with occasional aberrant conducted beats.Will add metoprolol tartrate 25 bid and follow. Will consider switching to amiodarone if rhythm is not improved with beta  blockers  2.Pleural effusions-no significant recurrence  3 CHF- Continue with careful diuresis following electrolytes, hemodynamics and renal function.    Signed, Teodoro Spray MD 01/31/2021, 12:51 PM Pager: [phone]

## 2021-01-31 NOTE — ED Notes (Signed)
Message sent to floor nurse regarding pt condition

## 2021-01-31 NOTE — ED Provider Notes (Signed)
[facility] Emergency Department Provider Note  ____________________________________________   Event Date/Time   First MD Initiated Contact with Patient 01/31/21 1105     (approximate)  I have reviewed the triage vital signs and the nursing notes.   HISTORY  Chief Complaint Shortness of Breath   HPI [name] is a 87 y.o. male presents to the ED via EMS with complaint of shortness of breath.  Patient states that he does not use oxygen at home but has a history of CHF.  He recently had a thoracentesis on 01/27/2021 at which time he reports that 1/2 L of fluid was removed from each lung.  Patient reports that today he became more short of breath than usual.  He denies any changes in his medication nor has he skipped any medications.         Past Medical History:  Diagnosis Date  . Anemia   . Aortic aneurysm ([place])   . Aortic aneurysm ([place])   . Arthritis   . CKD (chronic kidney disease)    ALSO LEFT RENAL MASS  . Coronary artery disease   . Diverticulosis    WITH RUPTURE  . GERD (gastroesophageal reflux disease)   . History of hiatal hernia   . Hypercholesteremia   . Hypertension   . MGUS (monoclonal gammopathy of unknown significance)   . MGUS (monoclonal gammopathy of unknown significance)     Patient Active Problem List   Diagnosis Date Noted  . CHF exacerbation ([place]) 01/31/2021  . Acute respiratory failure with hypoxia ([place]) 09/07/2020  . Elevated troponin 09/07/2020  . Pleural effusion 09/07/2020  . Acute on chronic systolic CHF (congestive heart failure) ([place]) 09/07/2020  . Thoracic compression fracture ([place]) 09/07/2020  . Pressure injury of skin 08/07/2020  . CHF (congestive heart failure) ([place]) 08/05/2020  . Periprosthetic fracture around internal prosthetic hip joint 08/05/2020  . Hyponatremia 08/05/2020  . Constipation 05/15/2020  . Acute renal failure superimposed on stage 4 chronic kidney disease ([place]) 05/15/2020  . Hip  fracture ([place]) 05/11/2020  . MGUS (monoclonal gammopathy of unknown significance) 05/23/2016  . S/P total knee arthroplasty 04/02/2016  . Chronic kidney disease 11/22/2015  . CAD in native artery 11/22/2015  . TI (tricuspid incompetence) 05/11/2015  . Early satiety 01/25/2015  . Benign essential HTN 01/03/2015  . Abdominal aortic aneurysm (AAA) without rupture ([place]) 07/09/2014  . MI (mitral incompetence) 07/09/2014  . Aortic heart valve narrowing 07/09/2014  . Cardiomyopathy, ischemic 06/28/2014  . Arthritis of knee, degenerative 05/17/2014  . Kidney lump 04/09/2013  . Benign prostatic hyperplasia with urinary obstruction 03/17/2013  . Chronic kidney disease, stage IV (severe) ([place]) 03/17/2013  . 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 AORTIC VALVE REPLACEMENT    . bipass    . CARDIAC VALVE REPLACEMENT     pig valve  . CHOLECYSTECTOMY    . COLON SURGERY    . CORONARY ANGIOPLASTY    . CORONARY ARTERY BYPASS GRAFT    . ESOPHAGOGASTRODUODENOSCOPY (EGD) WITH PROPOFOL N/A 02/07/2015   Procedure: ESOPHAGOGASTRODUODENOSCOPY (EGD) WITH PROPOFOL;  Surgeon: Hulen Luster, MD;  Location: [facility] ENDOSCOPY;  Service: Gastroenterology;  Laterality: N/A;  . EYE SURGERY    . HERNIA REPAIR    . INTRAMEDULLARY (IM) NAIL INTERTROCHANTERIC Right 05/12/2020   Procedure: INTRAMEDULLARY (IM) NAIL INTERTROCHANTRIC;  Surgeon: Lovell Sheehan, MD;  Location: [facility] ORS;  Service: Orthopedics;  Laterality: Right;  .  JOINT REPLACEMENT     left knee x4  . KNEE ARTHROPLASTY Right 04/02/2016   Procedure: COMPUTER ASSISTED TOTAL KNEE ARTHROPLASTY;  Surgeon: Dereck Leep, MD;  Location: [facility] ORS;  Service: Orthopedics;  Laterality: Right;  . ORIF FEMUR FRACTURE Right 08/08/2020   Procedure: OPEN REDUCTION INTERNAL FIXATION (ORIF) DISTAL FEMUR FRACTURE;  Surgeon: Lovell Sheehan, MD;  Location: [facility] ORS;  Service: Orthopedics;  Laterality:  Right;    Prior to Admission medications   Medication Sig Start Date End Date Taking? Authorizing Provider  albuterol (VENTOLIN HFA) 108 (90 Base) MCG/ACT inhaler Inhale into the lungs. 01/16/21 01/16/22 Yes [provider]  ALPRAZolam Duanne Moron) 0.25 MG tablet Take 0.25 mg by mouth 2 (two) times daily as needed. 08/26/20  Yes [provider]  aspirin EC 81 MG tablet Take 1 tablet (81 mg total) by mouth 2 (two) times daily. Swallow whole. 08/10/20  Yes Donne Hazel, MD  fluticasone [facility]) 50 MCG/ACT nasal spray USE 2 SPRAYS INTO BOTH NOSTRILS DAILY 10/11/20  Yes [provider]  Melatonin 3 MG TABS Take 1.5 mg by mouth at bedtime as needed (sleep).   Yes [provider]  pravastatin (PRAVACHOL) 20 MG tablet Take 20 mg by mouth every evening.   Yes [provider]  sodium bicarbonate 650 MG tablet Take 1,300 mg by mouth 2 (two) times daily.   Yes [provider]  torsemide (DEMADEX) 20 MG tablet Take 2 tablets (40 mg total) by mouth daily. 09/10/20  Yes Lorella Nimrod, MD  traMADol (ULTRAM) 50 MG tablet Take 50 mg by mouth every 4 (four) hours as needed. 08/16/20  Yes [provider]  zolpidem (AMBIEN) 5 MG tablet Take by mouth. 01/13/13  Yes [provider]    Allergies Morphine, Oxycodone, Carvedilol, Iodinated diagnostic agents, and Lisinopril  Family History  Problem Relation Age of Onset  . Bladder Cancer Neg Hx   . Prostate cancer Neg Hx   . Kidney cancer Neg Hx     Social History Social History   Tobacco Use  . Smoking status: Former Research scientist (life sciences)  . Smokeless tobacco: Never Used  Substance Use Topics  . Alcohol use: No  . Drug use: No    Review of Systems Constitutional: No fever/chills Eyes: No visual changes. ENT: No sore throat. Cardiovascular: Denies chest pain. Respiratory: Positive shortness of breath.  Negative for cough. Gastrointestinal: No abdominal pain.  No nausea, no vomiting.  No diarrhea.    Genitourinary: Negative for dysuria. Musculoskeletal: No musculoskeletal injuries. Skin: Negative for rash. Neurological: Negative for headaches, focal weakness or numbness. ____________________________________________   PHYSICAL EXAM:  VITAL SIGNS: ED Triage Vitals  Enc Vitals Group     BP 01/31/21 1115 119/88     Pulse Rate 01/31/21 1115 (!) 47     Resp 01/31/21 1115 18     Temp 01/31/21 1115 97.7 F (36.5 C)     Temp Source 01/31/21 1115 Oral     SpO2 01/31/21 1115 95 %     Weight 01/31/21 1104 160 lb (72.6 kg)     Height 01/31/21 1104 5\' 11"  (1.803 m)     Head Circumference --      Peak Flow --      Pain Score 01/31/21 1104 0     Pain Loc --      Pain Edu? --      Excl. in [place]? --    Constitutional: Alert and oriented. Well appearing, able to answer questions appropriately  but using abdominal muscles for breathing. Eyes: Conjunctivae are normal. PERRL. EOMI. Head: Atraumatic. Nose: No congestion/rhinnorhea. Mouth/Throat: Mucous membranes are moist.   Neck: No stridor.   Cardiovascular: Normal rate, regular rhythm. Grossly normal heart sounds.  Good peripheral circulation. Respiratory: Normal respiratory effort.  No retractions. Lungs no rales or rhonchi noted at this time. Gastrointestinal: Soft and nontender. No distention.  Bowel sounds normoactive x4 quadrants. Musculoskeletal: No lower extremity tenderness nor edema.  Positive pitting edema noted lower extremities. Neurologic:  Normal speech and language. No gross focal neurologic deficits are appreciated.  Skin:  Skin is warm, dry and intact. No rash noted. Psychiatric: Mood and affect are normal. Speech and behavior are normal.  ____________________________________________   LABS (all labs ordered are listed, but only abnormal results are displayed)  Labs Reviewed  CBC WITH DIFFERENTIAL/PLATELET - Abnormal; Notable for the following components:      Result Value   RDW 15.7 (*)    All other components  within normal limits  COMPREHENSIVE METABOLIC PANEL - Abnormal; Notable for the following components:   Chloride 94 (*)    Glucose, Bld 117 (*)    BUN 91 (*)    Creatinine, Ser 3.43 (*)    AST 92 (*)    ALT 77 (*)    Alkaline Phosphatase 137 (*)    Total Bilirubin 3.0 (*)    GFR, Estimated 17 (*)    Anion gap 19 (*)    All other components within normal limits  BRAIN NATRIURETIC PEPTIDE - Abnormal; Notable for the following components:   B Natriuretic Peptide >4,500.0 (*)    All other components within normal limits  TROPONIN I (HIGH SENSITIVITY) - Abnormal; Notable for the following components:   Troponin I (High Sensitivity) 149 (*)    All other components within normal limits  TROPONIN I (HIGH SENSITIVITY) - Abnormal; Notable for the following components:   Troponin I (High Sensitivity) 137 (*)    All other components within normal limits  RESP PANEL BY RT-PCR (FLU A&B, COVID) ARPGX2  URINALYSIS, COMPLETE (UACMP) WITH MICROSCOPIC  TSH   ____________________________________________  EKG  Ventricular tachycardia with ventricular rate of 115. ____________________________________________  RADIOLOGY Leana Gamer, personally viewed and evaluated these images (plain radiographs) as part of my medical decision making, as well as reviewing the written report by the radiologist.   Official radiology report(s): DG Chest Port 1 View  Result Date: 01/31/2021 CLINICAL DATA:  Shortness of breath. EXAM: PORTABLE CHEST 1 VIEW COMPARISON:  Chest x-ray dated Jan 27, 2021. FINDINGS: Stable cardiomegaly status post CABG and AVR. Unchanged pulmonary vascular congestion and interstitial thickening. Unchanged small bilateral pleural effusions and bibasilar atelectasis. No pneumothorax. No acute osseous abnormality. IMPRESSION: 1. Unchanged congestive heart failure. Electronically Signed   By: Titus Dubin M.D.   On: 01/31/2021 11:47     ____________________________________________   PROCEDURES  Procedure(s) performed (including Critical Care):  Procedures   ____________________________________________   INITIAL IMPRESSION / ASSESSMENT AND PLAN / ED COURSE  As part of my medical decision making, I reviewed the following data within the electronic MEDICAL RECORD NUMBER Notes from prior ED visits and [place] Controlled Substance Database  Dr. Cherylann Banas in to see patient also.    87 year old male presents to the ED with complaint of shortness of breath that has worsened since yesterday.  Patient states that walking and talking he became more short of breath this morning.  Patient has known CHF, coronary artery disease, chronic kidney disease, GERD,  hypercholesterolemia and hypertension..  Patient is alert and oriented.  He is able to answer questions.  Troponin was elevated at 149, BNP 4500, chest x-ray is unchanged CHF.  Initially O2 sat was 90%.  Admission arrangements and also Dr. Ubaldo Glassing was notified and will come and evaluate cardiac status.    ____________________________________________   FINAL CLINICAL IMPRESSION(S) / ED DIAGNOSES  Final diagnoses:  Acute on chronic congestive heart failure, unspecified heart failure type [facility])     ED Discharge Orders    None      *Please note:  [name] was evaluated in Emergency Department on 01/31/2021 for the symptoms described in the history of present illness. He was evaluated in the context of the global COVID-19 pandemic, which necessitated consideration that the patient might be at risk for infection with the SARS-CoV-2 virus that causes COVID-19. Institutional protocols and algorithms that pertain to the evaluation of patients at risk for COVID-19 are in a state of rapid change based on information released by regulatory bodies including the CDC and federal and state organizations. These policies and algorithms were followed during the patient's care in the ED.  Some  ED evaluations and interventions may be delayed as a result of limited staffing during and the pandemic.*   Note:  This document was prepared using Dragon voice recognition software and may include unintentional dictation errors.    Johnn Hai, PA-C 01/31/21 1359    Arta Silence, MD 01/31/21 1506

## 2021-01-31 NOTE — ED Notes (Signed)
Family at bedside. 

## 2021-01-31 NOTE — H&P (Addendum)
.   Chief Complaint: Shortness of breath HPI: [name] is an 87 y.o. male with known history of MGUS, chronic kidney disease stage IIIb, coronary disease status post CABG in the past, HFrEF, status post aortic valve replacement, hypertension and hyperlipidemia.  Patient has had recurrent pleural effusion and is status post recent thoracentesis a few days ago.  He presents to the emergency room on account of progressive worsening dyspnea.  He also admitted to worsening lower extremity edema.  He admits compliance with his medication including diuretics with no significant improvement.  He has developed significant dyspnea with minimal exertion.  ED course: In the ED, patient was found to be hypoxic hence requiring BiPAP for respiratory support.  He was given IV Lasix 80 mg.  Cardiology was consulted in the ED and did evaluate patient.  EKG rhythm shows frequent PVCs.  Chest x-ray was again concerning for bilateral pleural effusions.  BNP was noted to be elevated greater than 4500.  Past Medical History:  Diagnosis Date  . Anemia   . Aortic aneurysm ([place])   . Aortic aneurysm ([place])   . Arthritis   . CKD (chronic kidney disease)    ALSO LEFT RENAL MASS  . Coronary artery disease   . Diverticulosis    WITH RUPTURE  . GERD (gastroesophageal reflux disease)   . History of hiatal hernia   . Hypercholesteremia   . Hypertension   . MGUS (monoclonal gammopathy of unknown significance)   . MGUS (monoclonal gammopathy of unknown significance)     Past Surgical History:  Procedure Laterality Date  . AORTIC VALVE REPLACEMENT    . bipass    . CARDIAC VALVE REPLACEMENT     pig valve  . CHOLECYSTECTOMY    . COLON SURGERY    . CORONARY ANGIOPLASTY    . CORONARY ARTERY BYPASS GRAFT    . ESOPHAGOGASTRODUODENOSCOPY (EGD) WITH PROPOFOL N/A 02/07/2015   Procedure: ESOPHAGOGASTRODUODENOSCOPY (EGD) WITH PROPOFOL;  Surgeon: Hulen Luster, MD;  Location: [facility] ENDOSCOPY;  Service: Gastroenterology;   Laterality: N/A;  . EYE SURGERY    . HERNIA REPAIR    . INTRAMEDULLARY (IM) NAIL INTERTROCHANTERIC Right 05/12/2020   Procedure: INTRAMEDULLARY (IM) NAIL INTERTROCHANTRIC;  Surgeon: Lovell Sheehan, MD;  Location: [facility] ORS;  Service: Orthopedics;  Laterality: Right;  . JOINT REPLACEMENT     left knee x4  . KNEE ARTHROPLASTY Right 04/02/2016   Procedure: COMPUTER ASSISTED TOTAL KNEE ARTHROPLASTY;  Surgeon: Dereck Leep, MD;  Location: [facility] ORS;  Service: Orthopedics;  Laterality: Right;  . ORIF FEMUR FRACTURE Right 08/08/2020   Procedure: OPEN REDUCTION INTERNAL FIXATION (ORIF) DISTAL FEMUR FRACTURE;  Surgeon: Lovell Sheehan, MD;  Location: [facility] ORS;  Service: Orthopedics;  Laterality: Right;    Family History  Problem Relation Age of Onset  . Bladder Cancer Neg Hx   . Prostate cancer Neg Hx   . Kidney cancer Neg Hx    Social History:  reports that he has quit smoking. He has never used smokeless tobacco. He reports that he does not drink alcohol and does not use drugs.  Allergies:  Allergies  Allergen Reactions  . Morphine Anaphylaxis  . Oxycodone Other (See Comments)    Hypotension and bradycardia  . Carvedilol     Dizziness and syncope   . Iodinated Diagnostic Agents Rash    Other reaction(s): Asthenia (finding), Other (qualifier value) paralyzed legs Other reaction(s): RASH   . Lisinopril Cough and Other (See Comments)    (Not  in a hospital admission)   Results for orders placed or performed during the hospital encounter of 01/31/21 (from the past 48 hour(s))  CBC with Differential     Status: Abnormal   Collection Time: 01/31/21 11:09 AM  Result Value Ref Range   WBC 7.7 4.0 - 10.5 K/uL   RBC 4.61 4.22 - 5.81 MIL/uL   Hemoglobin 14.6 13.0 - 17.0 g/dL   HCT 42.2 39.0 - 52.0 %   MCV 91.5 80.0 - 100.0 fL   MCH 31.7 26.0 - 34.0 pg   MCHC 34.6 30.0 - 36.0 g/dL   RDW 15.7 (H) 11.5 - 15.5 %   Platelets 210 150 - 400 K/uL   nRBC 0.0 0.0 - 0.2 %   Neutrophils  Relative % 75 %   Neutro Abs 5.7 1.7 - 7.7 K/uL   Lymphocytes Relative 12 %   Lymphs Abs 0.9 0.7 - 4.0 K/uL   Monocytes Relative 13 %   Monocytes Absolute 1.0 0.1 - 1.0 K/uL   Eosinophils Relative 0 %   Eosinophils Absolute 0.0 0.0 - 0.5 K/uL   Basophils Relative 0 %   Basophils Absolute 0.0 0.0 - 0.1 K/uL   Immature Granulocytes 0 %   Abs Immature Granulocytes 0.03 0.00 - 0.07 K/uL    Comment: Performed at [facility], [address] [place], [place] [address]  Comprehensive metabolic panel     Status: Abnormal   Collection Time: 01/31/21 11:09 AM  Result Value Ref Range   Sodium 138 135 - 145 mmol/L   Potassium 3.7 3.5 - 5.1 mmol/L   Chloride 94 (L) 98 - 111 mmol/L   CO2 25 22 - 32 mmol/L   Glucose, Bld 117 (H) 70 - 99 mg/dL    Comment: Glucose reference range applies only to samples taken after fasting for at least 8 hours.   BUN 91 (H) 8 - 23 mg/dL   Creatinine, Ser 3.43 (H) 0.61 - 1.24 mg/dL   Calcium 9.3 8.9 - 10.3 mg/dL   Total Protein 6.9 6.5 - 8.1 g/dL   Albumin 3.7 3.5 - 5.0 g/dL   AST 92 (H) 15 - 41 U/L   ALT 77 (H) 0 - 44 U/L   Alkaline Phosphatase 137 (H) 38 - 126 U/L   Total Bilirubin 3.0 (H) 0.3 - 1.2 mg/dL   GFR, Estimated 17 (L) >60 mL/min    Comment: (NOTE) Calculated using the CKD-EPI Creatinine Equation (2021)    Anion gap 19 (H) 5 - 15    Comment: Performed at [facility], [address] [place], [place] [address]  Troponin I (High Sensitivity)     Status: Abnormal   Collection Time: 01/31/21 11:09 AM  Result Value Ref Range   Troponin I (High Sensitivity) 149 (HH) <18 ng/L    Comment: CRITICAL RESULT CALLED TO, READ BACK BY AND VERIFIED WITH RYANE WHITE AT 1201 01/31/2021 DLB (NOTE) Elevated high sensitivity troponin I (hsTnI) values and significant  changes across serial measurements may suggest ACS but many other  chronic and acute conditions are known to elevate hsTnI results.  Refer to the "Links" section for chest pain  algorithms and additional  guidance. Performed at [facility], [address] [place], [place] [address]   Brain natriuretic peptide     Status: Abnormal   Collection Time: 01/31/21 11:09 AM  Result Value Ref Range   B Natriuretic Peptide >4,500.0 (H) 0.0 - 100.0 pg/mL    Comment: Performed at [employer]  [facility], [place], [place] [address]  Resp Panel by RT-PCR (Flu A&B, Covid) Nasopharyngeal Swab     Status: None   Collection Time: 01/31/21 11:49 AM   Specimen: Nasopharyngeal Swab; Nasopharyngeal(NP) swabs in vial transport medium  Result Value Ref Range   SARS Coronavirus 2 by RT PCR NEGATIVE NEGATIVE    Comment: (NOTE) SARS-CoV-2 target nucleic acids are NOT DETECTED.  The SARS-CoV-2 RNA is generally detectable in upper respiratory specimens during the acute phase of infection. The lowest concentration of SARS-CoV-2 viral copies this assay can detect is 138 copies/mL. A negative result does not preclude SARS-Cov-2 infection and should not be used as the sole basis for treatment or other patient management decisions. A negative result may occur with  improper specimen collection/handling, submission of specimen other than nasopharyngeal swab, presence of viral mutation(s) within the areas targeted by this assay, and inadequate number of viral copies(<138 copies/mL). A negative result must be combined with clinical observations, patient history, and epidemiological information. The expected result is Negative.  Fact Sheet for Patients:  EntrepreneurPulse.com.au  Fact Sheet for Healthcare Providers:  IncredibleEmployment.be  This test is no t yet approved or cleared by the [place] FDA and  has been authorized for detection and/or diagnosis of SARS-CoV-2 by FDA under an Emergency Use Authorization (EUA). This EUA will remain  in effect (meaning this test can be used) for the duration of the COVID-19  declaration under Section 564(b)(1) of the Act, 21 U.S.C.section 360bbb-3(b)(1), unless the authorization is terminated  or revoked sooner.       Influenza A by PCR NEGATIVE NEGATIVE   Influenza B by PCR NEGATIVE NEGATIVE    Comment: (NOTE) The Xpert Xpress SARS-CoV-2/FLU/RSV plus assay is intended as an aid in the diagnosis of influenza from Nasopharyngeal swab specimens and should not be used as a sole basis for treatment. Nasal washings and aspirates are unacceptable for Xpert Xpress SARS-CoV-2/FLU/RSV testing.  Fact Sheet for Patients: EntrepreneurPulse.com.au  Fact Sheet for Healthcare Providers: IncredibleEmployment.be  This test is not yet approved or cleared by the [place] FDA and has been authorized for detection and/or diagnosis of SARS-CoV-2 by FDA under an Emergency Use Authorization (EUA). This EUA will remain in effect (meaning this test can be used) for the duration of the COVID-19 declaration under Section 564(b)(1) of the Act, 21 U.S.C. section 360bbb-3(b)(1), unless the authorization is terminated or revoked.  Performed at [facility], [place], [place] [address]   Troponin I (High Sensitivity)     Status: Abnormal   Collection Time: 01/31/21  1:17 PM  Result Value Ref Range   Troponin I (High Sensitivity) 137 (HH) <18 ng/L    Comment: CRITICAL VALUE NOTED. VALUE IS CONSISTENT WITH PREVIOUSLY REPORTED/CALLED VALUE DLB (NOTE) Elevated high sensitivity troponin I (hsTnI) values and significant  changes across serial measurements may suggest ACS but many other  chronic and acute conditions are known to elevate hsTnI results.  Refer to the "Links" section for chest pain algorithms and additional  guidance. Performed at [facility], [address], [place], [place] [address]    DG Chest [place] 1 View  Result Date: 01/31/2021 CLINICAL DATA:  Shortness of breath. EXAM: PORTABLE  CHEST 1 VIEW COMPARISON:  Chest x-ray dated Jan 27, 2021. FINDINGS: Stable cardiomegaly status post CABG and AVR. Unchanged pulmonary vascular congestion and interstitial thickening. Unchanged small bilateral pleural effusions and bibasilar atelectasis. No pneumothorax. No acute osseous abnormality. IMPRESSION: 1. Unchanged  congestive heart failure. Electronically Signed   By: Titus Dubin M.D.   On: 01/31/2021 11:47    Review of Systems  Constitutional: Positive for activity change and fatigue.  Eyes: Negative for discharge.  Respiratory: Positive for shortness of breath.   Cardiovascular: Positive for palpitations and leg swelling. Negative for chest pain.  Gastrointestinal: Negative for abdominal distention.  Genitourinary: Negative for difficulty urinating.  Musculoskeletal: Negative.   Neurological: Negative for dizziness.  Hematological: Does not bruise/bleed easily.  Psychiatric/Behavioral: Negative for behavioral problems and confusion.    Blood pressure (!) 123/101, pulse 91, temperature 97.7 F (36.5 C), temperature source Oral, resp. rate (!) 23, height 5\' 11"  (1.803 m), weight 72.6 kg, SpO2 96 %. Physical Exam Constitutional:      General: He is in acute distress.     Appearance: He is ill-appearing.  HENT:     Head: Atraumatic.  Eyes:     Extraocular Movements: Extraocular movements intact.  Neck:     Vascular: JVD present.  Cardiovascular:     Rate and Rhythm: Rhythm irregular.     Heart sounds: Murmur heard.    Pulmonary:     Effort: Tachypnea present.     Breath sounds: Examination of the right-middle field reveals decreased breath sounds. Examination of the left-middle field reveals decreased breath sounds. Examination of the right-lower field reveals decreased breath sounds. Examination of the left-lower field reveals decreased breath sounds. Decreased breath sounds present.     Comments: Bipap in place for respiratory support Chest:     Chest wall: No  deformity.  Abdominal:     General: Bowel sounds are normal.     Palpations: Abdomen is soft.  Musculoskeletal:     Cervical back: Neck supple.     Right lower leg: Edema present.     Left lower leg: Edema present.  Skin:    General: Skin is warm.  Neurological:     General: No focal deficit present.     Mental Status: He is alert and oriented to person, place, and time.      Assessment/Plan  In summary, patient is a male with history of 87 year old gentleman with history of coronary disease, aortic valve replacement presented with recurrent CHF and pleural effusions.  Telemetry and EKG showing intermittent episodes of tachyarrhythmia.  Cardiology consulted and involved with patient's care.  Acute dyspnea most likely secondary to CHF exacerbation: Evidence of pulmonary vascular congestion with bilateral effusion.  Elevated liver enzymes likely suggesting hepatic congestion.  Patient will be optimized with IV Lasix 40 mg twice daily.  Daily weight monitoring.  Fluid restriction advised.  At this time, patient respiratory status has been optimized with on BiPAP.Reat ECHOm pending.  Frequent PVCs versus atrial of fibrillation with aberrancy: Cardiology input will be warranted in a patient with history of aortic valve replacement.  Patient is not on any anticoagulation or rate controlling medication.  Consider beta-blockers.  Patient was given a bolus of magnesium.  Patient will be admitted to progressive cardiac unit.  Telemetry monitoring as per protocol.  Abnormal cardiac markers: Cardiac markers remained flat.  Likely due to demand ischemia from tachyarrhythmia.  Acute on chronic kidney injury: Suspect cardiorenal etiology.  Nephrology has been consulted.  Known history of MGUS.  Avoid nephrotoxic medication  Bilateral pleural effusions: Status post recent thoracentesis.  We will follow-up with serial chest x-rays.  Continue with diuretics with caution due to AKI.  Last thoracentesis  yielded lymphocyte predominant transudate.  This was attributed to  CHF and renal failure.  Patient also has underlying MGUS.  Nephrology input regarding  Hypertension: Blood pressure stable.  History of anxiety disorder: Patient is on low-dose Xanax as needed at home.  We will continue with same.  Patient daughter who was at bedside was updated regarding patient's clinical course and plan of care. Artist Beach, MD 01/31/2021, 2:17 PM

## 2021-01-31 NOTE — ED Notes (Signed)
Critical lab   Trop 149  MD. Siadecki advised

## 2021-01-31 NOTE — ED Triage Notes (Signed)
Pt BIB EMS for SOB. Pt states he is having SOB while walking and talking. Pt does not wear O2 at home. EMS noted RA O2 in low 80s. Pt placed on 6L [place]. O2 in 90s.  Pt has hx of CHF exacerbation.  Last week had 1/2 L of fluid drained from each lung.

## 2021-01-31 NOTE — ED Notes (Signed)
Patient transported to X-ray 

## 2021-02-01 DIAGNOSIS — I5023 Acute on chronic systolic (congestive) heart failure: Secondary | ICD-10-CM | POA: Diagnosis not present

## 2021-02-01 DIAGNOSIS — L89302 Pressure ulcer of unspecified buttock, stage 2: Secondary | ICD-10-CM

## 2021-02-01 DIAGNOSIS — L89150 Pressure ulcer of sacral region, unstageable: Secondary | ICD-10-CM

## 2021-02-01 LAB — CHOLESTEROL, BODY FLUID: Cholesterol, Fluid: 12 mg/dL

## 2021-02-01 LAB — ECHOCARDIOGRAM COMPLETE
AR max vel: 0.73 cm2
AV Area VTI: 0.77 cm2
AV Area mean vel: 0.72 cm2
AV Mean grad: 8.3 mmHg
AV Peak grad: 12.9 mmHg
Ao pk vel: 1.79 m/s
Height: 71 in
S' Lateral: 5.57 cm
Weight: 2560 oz

## 2021-02-01 LAB — BASIC METABOLIC PANEL
Anion gap: 16 — ABNORMAL HIGH (ref 5–15)
BUN: 99 mg/dL — ABNORMAL HIGH (ref 8–23)
CO2: 25 mmol/L (ref 22–32)
Calcium: 8.9 mg/dL (ref 8.9–10.3)
Chloride: 99 mmol/L (ref 98–111)
Creatinine, Ser: 3.41 mg/dL — ABNORMAL HIGH (ref 0.61–1.24)
GFR, Estimated: 17 mL/min — ABNORMAL LOW (ref >60)
Glucose, Bld: 113 mg/dL — ABNORMAL HIGH (ref 70–99)
Potassium: 3.8 mmol/L (ref 3.5–5.1)
Sodium: 140 mmol/L (ref 135–145)

## 2021-02-01 LAB — GLUCOSE, CAPILLARY
Glucose-Capillary: 112 mg/dL — ABNORMAL HIGH (ref 70–99)
Glucose-Capillary: 149 mg/dL — ABNORMAL HIGH (ref 70–99)

## 2021-02-01 LAB — MAGNESIUM: Magnesium: 3.2 mg/dL — ABNORMAL HIGH (ref 1.7–2.4)

## 2021-02-01 MED ORDER — AMIODARONE LOAD VIA INFUSION
150.0000 mg | Freq: Once | INTRAVENOUS | Status: AC
  Administered 2021-02-01: 75 mg via INTRAVENOUS
  Filled 2021-02-01: qty 83.34

## 2021-02-01 MED ORDER — AMIODARONE HCL IN DEXTROSE 360-4.14 MG/200ML-% IV SOLN
30.0000 mg/h | INTRAVENOUS | Status: DC
  Administered 2021-02-01 – 2021-02-03 (×4): 30 mg/h via INTRAVENOUS
  Filled 2021-02-01 (×4): qty 200

## 2021-02-01 MED ORDER — MENTHOL 3 MG MT LOZG
1.0000 | LOZENGE | OROMUCOSAL | Status: DC | PRN
  Administered 2021-02-01 – 2021-02-02 (×3): 3 mg via ORAL
  Filled 2021-02-01: qty 9

## 2021-02-01 MED ORDER — AMIODARONE HCL IN DEXTROSE 360-4.14 MG/200ML-% IV SOLN
60.0000 mg/h | INTRAVENOUS | Status: AC
  Administered 2021-02-01: 60 mg/h via INTRAVENOUS
  Filled 2021-02-01: qty 200

## 2021-02-01 MED ORDER — LOSARTAN POTASSIUM 25 MG PO TABS
25.0000 mg | ORAL_TABLET | Freq: Every day | ORAL | Status: DC
  Administered 2021-02-01 – 2021-02-08 (×8): 25 mg via ORAL
  Filled 2021-02-01 (×8): qty 1

## 2021-02-01 NOTE — Progress Notes (Signed)
PT Cancellation Note  Patient Details Name: [name] MRN: [id number] DOB: June 05, 1931   Cancelled Treatment:    Reason Eval/Treat Not Completed: Other (comment). Pt just returned to bed with nursing prior to PT attempt. Pt declined mobility at this time due to fatigue/wanting to rest. PT to re-attempt first thing tomorrow AM.    Lieutenant Diego PT, DPT 3:55 PM,02/01/21

## 2021-02-01 NOTE — TOC Initial Note (Signed)
Transition of Care [facility]) - Initial/Assessment Note    Patient Details  Name: [name] MRN: [id number] Date of Birth: 1931-07-03  Transition of Care [facility]) CM/SW Contact:    Alberteen Sam, LCSW Phone Number: 02/01/2021, 10:27 AM  Clinical Narrative:                  CSW met with patient at bedside for heart failure home health screen. Patient reports having PCP and no issues getting medications and no problems with transportation.   Patient does report currently living alone. Reports he has support from daughter in law, daughter and son if he needs them however lives alone at home. Patient reports he would potentially be agreeable with home health services if needed. RN at bedside agrees patient may need home health services upon discharge.   Patient reports he has not had home health services in the past.   CSW has requested PT/OT eval be ordered from MD to assess patient's needs at home.  Expected Discharge Plan: Industry Barriers to Discharge: Continued Medical Work up   Patient Goals and CMS Choice Patient states their goals for this hospitalization and ongoing recovery are:: to go home CMS Medicare.gov Compare Post Acute Care list provided to:: Patient Choice offered to / list presented to : Patient  Expected Discharge Plan and Services Expected Discharge Plan: [place]       Living arrangements for the past 2 months: Single Family Home                                      Prior Living Arrangements/Services Living arrangements for the past 2 months: Single Family Home Lives with:: Self Patient language and need for interpreter reviewed:: Yes        Need for Family Participation in Patient Care: Yes (Comment) Care giver support system in place?: Yes (comment)   Criminal Activity/Legal Involvement Pertinent to Current Situation/Hospitalization: No - Comment as needed  Activities of Daily Living Home Assistive  Devices/Equipment: None ADL Screening (condition at time of admission) Patient's cognitive ability adequate to safely complete daily activities?: No Is the patient deaf or have difficulty hearing?: No Does the patient have difficulty seeing, even when wearing glasses/contacts?: No Does the patient have difficulty concentrating, remembering, or making decisions?: No Patient able to express need for assistance with ADLs?: Yes Does the patient have difficulty dressing or bathing?: No Independently performs ADLs?: Yes (appropriate for developmental age) Does the patient have difficulty walking or climbing stairs?: No Weakness of Legs: None Weakness of Arms/Hands: None  Permission Sought/Granted                  Emotional Assessment Appearance:: Appears stated age Attitude/Demeanor/Rapport: Gracious Affect (typically observed): Calm Orientation: : Oriented to Place,Oriented to Self,Oriented to  Time,Oriented to Situation Alcohol / Substance Use: Not Applicable Psych Involvement: No (comment)  Admission diagnosis:  CHF exacerbation ([place]) [I50.9] CHF (congestive heart failure), NYHA class IV, acute on chronic, systolic (HCC) [Q76.22] Acute on chronic congestive heart failure, unspecified heart failure type ([place]) [I50.9] Patient Active Problem List   Diagnosis Date Noted  . CHF exacerbation ([place]) 01/31/2021  . CHF (congestive heart failure), NYHA class IV, acute on chronic, systolic ([place]) 63/33/5456  . Acute respiratory failure with hypoxia ([name]) 09/07/2020  . Elevated troponin 09/07/2020  . Pleural effusion 09/07/2020  . Acute on  chronic systolic CHF (congestive heart failure) ([place]) 09/07/2020  . Thoracic compression fracture ([place]) 09/07/2020  . Pressure injury of skin 08/07/2020  . CHF (congestive heart failure) ([place]) 08/05/2020  . Periprosthetic fracture around internal prosthetic hip joint 08/05/2020  . Hyponatremia 08/05/2020  . Constipation 05/15/2020  . Acute renal  failure superimposed on stage 4 chronic kidney disease ([place]) 05/15/2020  . Hip fracture ([place]) 05/11/2020  . MGUS (monoclonal gammopathy of unknown significance) 05/23/2016  . S/P total knee arthroplasty 04/02/2016  . Chronic kidney disease 11/22/2015  . CAD in native artery 11/22/2015  . TI (tricuspid incompetence) 05/11/2015  . Early satiety 01/25/2015  . Benign essential HTN 01/03/2015  . Abdominal aortic aneurysm (AAA) without rupture ([place]) 07/09/2014  . MI (mitral incompetence) 07/09/2014  . Aortic heart valve narrowing 07/09/2014  . Cardiomyopathy, ischemic 06/28/2014  . Arthritis of knee, degenerative 05/17/2014  . Kidney lump 04/09/2013  . Benign prostatic hyperplasia with urinary obstruction 03/17/2013  . Chronic kidney disease, stage IV (severe) ([place]) 03/17/2013  . 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

## 2021-02-01 NOTE — Progress Notes (Signed)
PROGRESS NOTE    TASHAUN OBEY  ZOX:[id number] DOB: 1931/06/09 DOA: 01/31/2021 PCP: Baxter Hire, MD   Brief Narrative: 87 y.o. male with known history of MGUS, chronic kidney disease stage IIIb, coronary disease status post CABG in the past, HFrEF, status post aortic valve replacement, hypertension and hyperlipidemia.  Patient has had recurrent pleural effusion and is status post recent thoracentesis a few days ago.  He presents to the emergency room on account of progressive worsening dyspnea.  He also admitted to worsening lower extremity edema.  He admits compliance with his medication including diuretics with no significant improvement.  He has developed significant dyspnea with minimal exertion.  Seen by cardiology.  Remains symptomatic.  EF 15 to 20% on repeat echocardiogram.  BNP markedly elevated.   Assessment & Plan:   Active Problems:   CHF exacerbation (HCC)   CHF (congestive heart failure), NYHA class IV, acute on chronic, systolic (HCC)   Unstageable pressure ulcer of sacral region [facility])  Acute hypoxic respiratory failure Acute decompensated systolic congestive heart failure Cardiology following We will attempt to optimize volume status As may be challenging considering worsening of kidney function A EF 15 to 20% Plan: Continue Lasix 40 mg IV twice daily Target net -1-1.5 L daily May need increase and Lasix dose if we do not meet this goal Fluid restrict Wean oxygen as tolerated Palliative care consultation for goals of care discussion  Tachyarrhythmia Suspected atrial fibrillation with aberrancy Cardiology following Started on amiodarone Monitor electrolytes  Acute kidney injury on chronic kidney disease stage IIIb Suspect cardiorenal Will reassess after daily diuresis Nephrology input requested  Bilateral pleural effusion S/p thoracentesis Cautious use of diuretics  Hypertension Diuretics as above  Anxiety PTA Xanax   DVT prophylaxis: SQ  heparin Code Status: Full Family Communication: None today Disposition Plan: Status is: Inpatient  Remains inpatient appropriate because:Inpatient level of care appropriate due to severity of illness   Dispo: The patient is from: Home              Anticipated d/c is to: Unknown at this time.  SNF versus home with home health              Patient currently is not medically stable to d/c.   Difficult to place patient No  Advanced heart failure.  Acute on chronic systolic congestive heart failure.     Level of care: Progressive Cardiac  Consultants:   Cardiology  Nephrology  Procedures:   None  Antimicrobials: (specify start and planned stop date. Auto populated tables are space occupying and do not give end dates)  None   Subjective: Patient seen and examined.  Reports persistent shortness of breath over interval.  No other complaints.  Objective: Vitals:   02/01/21 1300 02/01/21 1400 02/01/21 1403 02/01/21 1500  BP: (!) 125/92 101/84  (!) 108/91  Pulse:      Resp: 20 18  10   Temp:   97.7 F (36.5 C)   TempSrc:   Oral   SpO2:      Weight:      Height:        Intake/Output Summary (Last 24 hours) at 02/01/2021 1524 Last data filed at 02/01/2021 1523 Gross per 24 hour  Intake 984.55 ml  Output 1000 ml  Net -15.45 ml   Filed Weights   01/31/21 1104 02/01/21 0721  Weight: 72.6 kg 70.9 kg    Examination:  General exam: Appears calm and comfortable  Respiratory system: Tachypneic, increased work  of breathing, scattered crackles bilaterally.  2 L Cardiovascular system: Tachycardic, regular rhythm, no murmurs Gastrointestinal system: Abdomen is nondistended, soft and nontender. No organomegaly or masses felt. Normal bowel sounds heard. Central nervous system: Alert and oriented. No focal neurological deficits. Extremities: Symmetric 5 x 5 power. Skin: No rashes, lesions or ulcers Psychiatry: Judgement and insight appear normal. Mood & affect appropriate.      Data Reviewed: I have personally reviewed following labs and imaging studies  CBC: Recent Labs  Lab 01/31/21 1109  WBC 7.7  NEUTROABS 5.7  HGB 14.6  HCT 42.2  MCV 91.5  PLT 277   Basic Metabolic Panel: Recent Labs  Lab 01/31/21 1109 02/01/21 0456  NA 138 140  K 3.7 3.8  CL 94* 99  CO2 25 25  GLUCOSE 117* 113*  BUN 91* 99*  CREATININE 3.43* 3.41*  CALCIUM 9.3 8.9  MG  --  3.2*   GFR: Estimated Creatinine Clearance: 15.3 mL/min (A) (by C-G formula based on SCr of 3.41 mg/dL (H)). Liver Function Tests: Recent Labs  Lab 01/31/21 1109  AST 92*  ALT 77*  ALKPHOS 137*  BILITOT 3.0*  PROT 6.9  ALBUMIN 3.7   No results for input(s): LIPASE, AMYLASE in the last 168 hours. No results for input(s): AMMONIA in the last 168 hours. Coagulation Profile: No results for input(s): INR, PROTIME in the last 168 hours. Cardiac Enzymes: No results for input(s): CKTOTAL, CKMB, CKMBINDEX, TROPONINI in the last 168 hours. BNP (last 3 results) No results for input(s): PROBNP in the last 8760 hours. HbA1C: No results for input(s): HGBA1C in the last 72 hours. CBG: Recent Labs  Lab 02/01/21 1230  GLUCAP 112*   Lipid Profile: No results for input(s): CHOL, HDL, LDLCALC, TRIG, CHOLHDL, LDLDIRECT in the last 72 hours. Thyroid Function Tests: Recent Labs    01/31/21 1109  TSH 7.914*   Anemia Panel: No results for input(s): VITAMINB12, FOLATE, FERRITIN, TIBC, IRON, RETICCTPCT in the last 72 hours. Sepsis Labs: No results for input(s): PROCALCITON, LATICACIDVEN in the last 168 hours.  Recent Results (from the past 240 hour(s))  Fungus Culture With Stain     Status: None (Preliminary result)   Collection Time: 01/25/21  1:24 PM   Specimen: PATH Cytology Pleural fluid  Result Value Ref Range Status   Fungus Stain Final report  Final    Comment: (NOTE) Performed At: [facility] [place], [place] [id number] Rush Farmer MD [id number]     Fungus (Mycology) Culture PENDING  Incomplete   Fungal Source PLEURAL  Final    Comment: Performed at [facility], [address] [place], [place] [address]  Acid Fast Smear (AFB)     Status: None   Collection Time: 01/25/21  1:24 PM   Specimen: PATH Cytology Pleural fluid  Result Value Ref Range Status   AFB Specimen Processing Concentration  Final   Acid Fast Smear Negative  Final    Comment: (NOTE) Performed At: [facility] [place], [place] [id number] Rush Farmer MD [id number]    Source (AFB) PLEURAL  Final    Comment: Performed at [facility], [address] [place], [place] [address]  Body fluid culture w Gram Stain     Status: None   Collection Time: 01/25/21  1:24 PM   Specimen: PATH Cytology Pleural fluid  Result Value Ref Range Status   Specimen Description   Final    PLEURAL Performed at [facility], [place]  [address], [place], [place] [address]    Special Requests   Final    NONE Performed at [facility], [address] [place], [place] [address]    Gram Stain   Final    FEW WBC PRESENT, PREDOMINANTLY MONONUCLEAR NO ORGANISMS SEEN    Culture   Final    NO GROWTH Performed at [facility], [place] [address], [place], [place] [address]    Report Status 01/29/2021 FINAL  Final  Fungus Culture Result     Status: None   Collection Time: 01/25/21  1:24 PM  Result Value Ref Range Status   Result 1 Comment  Final    Comment: (NOTE) KOH/Calcofluor preparation:  no fungus observed. Performed At: [facility] [place], [place] [id number] Rush Farmer MD [id number]   Fungus Culture With Stain     Status: None (Preliminary result)   Collection Time: 01/27/21  8:50 AM   Specimen: PATH Cytology Pleural fluid  Result Value Ref Range Status   Fungus Stain Final report  Final    Comment: (NOTE) Performed At: [facility] [place], [place]  [id number] Rush Farmer MD [id number]    Fungus (Mycology) Culture PENDING  Incomplete   Fungal Source PLEURAL  Final    Comment: Performed at [facility], [address] [place], [place] [address]  Acid Fast Smear (AFB)     Status: None   Collection Time: 01/27/21  8:50 AM   Specimen: PATH Cytology Pleural fluid  Result Value Ref Range Status   AFB Specimen Processing Concentration  Final   Acid Fast Smear Negative  Final    Comment: (NOTE) Performed At: [facility] [place], [place] [id number] Rush Farmer MD QB:[id number]    Source (AFB) PLEURAL  Final    Comment: Performed at [facility], [address] [place], [place] [address]  Body fluid culture w Gram Stain     Status: None   Collection Time: 01/27/21  8:50 AM   Specimen: PATH Cytology Pleural fluid  Result Value Ref Range Status   Specimen Description   Final    PLEURAL Performed at [facility], [address] [place], [place] [address]    Special Requests   Final    NONE Performed at [facility], [address] [place], [place] [address]    Gram Stain   Final    FEW WBC PRESENT, PREDOMINANTLY MONONUCLEAR NO ORGANISMS SEEN    Culture   Final    NO GROWTH 3 DAYS Performed at [facility], [place] [address] [place], [place] [address]    Report Status 01/30/2021 FINAL  Final  Fungus Culture Result     Status: None   Collection Time: 01/27/21  8:50 AM  Result Value Ref Range Status   Result 1 Comment  Final    Comment: (NOTE) KOH/Calcofluor preparation:  no fungus observed. Performed At: [facility] [place], [place] [id number] Rush Farmer MD JH:[id number]   Resp Panel by RT-PCR (Flu A&B, Covid) Nasopharyngeal Swab     Status: None   Collection Time: 01/31/21 11:49 AM   Specimen: Nasopharyngeal Swab; Nasopharyngeal(NP) swabs in vial transport medium  Result Value Ref Range Status   SARS Coronavirus 2  by RT PCR NEGATIVE NEGATIVE Final    Comment: (NOTE) SARS-CoV-2 target nucleic acids are NOT DETECTED.  The SARS-CoV-2 RNA is generally detectable in upper respiratory specimens during the acute phase  of infection. The lowest concentration of SARS-CoV-2 viral copies this assay can detect is 138 copies/mL. A negative result does not preclude SARS-Cov-2 infection and should not be used as the sole basis for treatment or other patient management decisions. A negative result may occur with  improper specimen collection/handling, submission of specimen other than nasopharyngeal swab, presence of viral mutation(s) within the areas targeted by this assay, and inadequate number of viral copies(<138 copies/mL). A negative result must be combined with clinical observations, patient history, and epidemiological information. The expected result is Negative.  Fact Sheet for Patients:  EntrepreneurPulse.com.au  Fact Sheet for Healthcare Providers:  IncredibleEmployment.be  This test is no t yet approved or cleared by the [place] FDA and  has been authorized for detection and/or diagnosis of SARS-CoV-2 by FDA under an Emergency Use Authorization (EUA). This EUA will remain  in effect (meaning this test can be used) for the duration of the COVID-19 declaration under Section 564(b)(1) of the Act, 21 U.S.C.section 360bbb-3(b)(1), unless the authorization is terminated  or revoked sooner.       Influenza A by PCR NEGATIVE NEGATIVE Final   Influenza B by PCR NEGATIVE NEGATIVE Final    Comment: (NOTE) The Xpert Xpress SARS-CoV-2/FLU/RSV plus assay is intended as an aid in the diagnosis of influenza from Nasopharyngeal swab specimens and should not be used as a sole basis for treatment. Nasal washings and aspirates are unacceptable for Xpert Xpress SARS-CoV-2/FLU/RSV testing.  Fact Sheet for Patients: EntrepreneurPulse.com.au  Fact  Sheet for Healthcare Providers: IncredibleEmployment.be  This test is not yet approved or cleared by the [place] FDA and has been authorized for detection and/or diagnosis of SARS-CoV-2 by FDA under an Emergency Use Authorization (EUA). This EUA will remain in effect (meaning this test can be used) for the duration of the COVID-19 declaration under Section 564(b)(1) of the Act, 21 U.S.C. section 360bbb-3(b)(1), unless the authorization is terminated or revoked.  Performed at [facility], [address] [place], [place] [address]          Radiology Studies: [facility] Chest [place] 1 View  Result Date: 01/31/2021 CLINICAL DATA:  Shortness of breath. EXAM: PORTABLE CHEST 1 VIEW COMPARISON:  Chest x-ray dated Jan 27, 2021. FINDINGS: Stable cardiomegaly status post CABG and AVR. Unchanged pulmonary vascular congestion and interstitial thickening. Unchanged small bilateral pleural effusions and bibasilar atelectasis. No pneumothorax. No acute osseous abnormality. IMPRESSION: 1. Unchanged congestive heart failure. Electronically Signed   By: Titus Dubin M.D.   On: 01/31/2021 11:47   ECHOCARDIOGRAM COMPLETE  Result Date: 02/01/2021    ECHOCARDIOGRAM REPORT   Patient Name:   [name] Date of Exam: 01/31/2021 Medical Rec #:  [id number]       Height:       71.0 in Accession #:    [id number]      Weight:       160.0 lb Date of Birth:  1931/04/20       BSA:          1.918 m Patient Age:    56 years        BP:           122/101 mmHg Patient Gender: M               HR:           106 bpm. Exam Location:  [facility] Procedure: 2D Echo, Cardiac Doppler and Color Doppler Indications:     R06.03 Acute Respiratory distress  History:         Patient has no prior history of Echocardiogram examinations.                  Aortic Valve replacement; Risk Factors:Dyslipidemia, Former                  Smoker and Hypertension. Coronary artery disease. Chronic                  kidney disease.   Sonographer:     Wilford Sports Rodgers-Jones Referring Phys:  [id number] [place] Diagnosing Phys: Bartholome Bill MD IMPRESSIONS  1. Left ventricular ejection fraction, by estimation, is <20%. The left ventricle has severely decreased function. The left ventricle has no regional wall motion abnormalities. The left ventricular internal cavity size was mildly dilated. Left ventricular diastolic parameters were normal.  2. Right ventricular systolic function is normal. The right ventricular size is mildly enlarged.  3. Left atrial size was mildly dilated.  4. Right atrial size was mildly dilated.  5. The mitral valve is grossly normal. Mild to moderate mitral valve regurgitation.  6. Tricuspid valve regurgitation is moderate.  7. The aortic valve is grossly normal. Aortic valve regurgitation is trivial. Mild aortic valve stenosis.  8. Aortic dilatation noted. FINDINGS  Left Ventricle: Left ventricular ejection fraction, by estimation, is <20%. The left ventricle has severely decreased function. The left ventricle has no regional wall motion abnormalities. The left ventricular internal cavity size was mildly dilated. There is no left ventricular hypertrophy. Left ventricular diastolic parameters were normal. Right Ventricle: The right ventricular size is mildly enlarged. Right vetricular wall thickness was not well visualized. Right ventricular systolic function is normal. Left Atrium: Left atrial size was mildly dilated. Right Atrium: Right atrial size was mildly dilated. Pericardium: There is no evidence of pericardial effusion. Mitral Valve: The mitral valve is grossly normal. Mild to moderate mitral valve regurgitation. Tricuspid Valve: The tricuspid valve is not well visualized. Tricuspid valve regurgitation is moderate. Aortic Valve: The aortic valve is grossly normal. Aortic valve regurgitation is trivial. Mild aortic stenosis is present. Aortic valve mean gradient measures 8.2 mmHg. Aortic valve peak gradient  measures 12.9 mmHg. Aortic valve area, by VTI measures 0.77  cm. Pulmonic Valve: The pulmonic valve was not well visualized. Pulmonic valve regurgitation is mild. Aorta: Aortic dilatation noted. IAS/Shunts: The atrial septum is grossly normal.  LEFT VENTRICLE PLAX 2D LVIDd:         5.88 cm LVIDs:         5.57 cm LV PW:         0.79 cm LV IVS:        0.85 cm LVOT diam:     2.10 cm LV SV:         20 LV SV Index:   10 LVOT Area:     3.46 cm  RIGHT VENTRICLE            IVC RV Basal diam:  5.06 cm    IVC diam: 2.42 cm RV S prime:     7.62 cm/s TAPSE (M-mode): 0.6 cm LEFT ATRIUM             Index       RIGHT ATRIUM           Index LA diam:        4.80 cm 2.50 cm/m  RA Area:     27.90 cm LA Vol (A2C):   98.4 ml 51.31 ml/m  RA Volume:   108.00 ml 56.31 ml/m LA Vol (A4C):   82.8 ml 43.17 ml/m LA Biplane Vol: 93.1 ml 48.55 ml/m  AORTIC VALVE AV Area (Vmax):    0.73 cm AV Area (Vmean):   0.72 cm AV Area (VTI):     0.77 cm AV Vmax:           179.25 cm/s AV Vmean:          133.750 cm/s AV VTI:            0.256 m AV Peak Grad:      12.9 mmHg AV Mean Grad:      8.2 mmHg LVOT Vmax:         37.93 cm/s LVOT Vmean:        27.833 cm/s LVOT VTI:          0.057 m LVOT/AV VTI ratio: 0.22  AORTA Ao Root diam: 4.10 cm MV E velocity: 65.00 cm/s  TRICUSPID VALVE MV A velocity: 35.90 cm/s  TR Peak grad:   30.7 mmHg MV E/A ratio:  1.81        TR Vmax:        277.00 cm/s                             SHUNTS                            Systemic VTI:  0.06 m                            Systemic Diam: 2.10 cm Bartholome Bill MD Electronically signed by Bartholome Bill MD Signature Date/Time: 02/01/2021/6:58:29 AM    Final         Scheduled Meds: . aspirin EC  81 mg Oral BID  . fluticasone  1 spray Each Nare Daily  . furosemide  40 mg Intravenous BID  . heparin  5,000 Units Subcutaneous Q8H  . losartan  25 mg Oral Daily  . sodium bicarbonate  1,300 mg Oral BID  . sodium chloride flush  3 mL Intravenous Q12H   Continuous Infusions: .  sodium chloride    . amiodarone 30 mg/hr (02/01/21 1515)     LOS: 1 day    Time spent: 35 minutes    Sidney Ace, MD [facility] Pager [phone]  If 7PM-7AM, please contact night-coverage 02/01/2021, 3:24 PM

## 2021-02-01 NOTE — Evaluation (Signed)
Occupational Therapy Evaluation Patient Details Name: [name] MRN: [id number] DOB: 1931-06-12 Today's Date: 02/01/2021    History of Present Illness Pt is an 87 y/o M with PMH: MGUS, chronic kidney disease stage IIIb, coronary disease status post CABG in the past, HFrEF, status post aortic valve replacement, hypertension and hyperlipidemia. Pt presented to ED d/t progressive worsening dyspnea and LE edema. Pt adm for CHF exacerbation. EF 15-20% on repeat echo.   Clinical Impression   Pt seen for OT evaluation this date in setting of acute hospitalization d/t CHF exacerbation. Pt presents this date with decreased fxl activity tolerance, decreased strength, and cardiopulmonary status limiting his ability to safely/efficiently perform ADLs/ADL mobility. Pt reports at baseline, he primarily perform fxl mobility using w/c lately d/t LE length discrepancy (per pt self-report). Pt states he is able to drive himself at baseline and puts his w/c in the bed of his pickup truck. Pt states his kids help him with cleaning and some home repairs. Pt states he is otherwise able to care for himself. States he lives in a [facility] with level entry. On assessment this date, pt requires SETUP for seated UB ADLs, MIN/MOD A for seated LB ADLs. Pt requires MIN A for SPS ADL transfers. Will continue to follow acutely as pt will benefit from skilled OT to progress towards his baseline level of functional performance. At this time, as pt is primarily limited d/t decreased fxl activity tolerance, anticipate he will be able to return to home environment with [place] f/u once medically stable.     Follow Up Recommendations  Home health OT;Supervision - Intermittent    Equipment Recommendations  3 in 1 bedside commode;Tub/shower seat    Recommendations for Other Services       Precautions / Restrictions Precautions Precautions: Fall Restrictions Weight Bearing Restrictions: No      Mobility Bed Mobility Overal bed  mobility: Modified Independent                  Transfers Overall transfer level: Needs assistance Equipment used: 1 person hand held assist Transfers: Sit to/from Stand;Stand Pivot Transfers Sit to Stand: Min assist Stand pivot transfers: Min assist            Balance Overall balance assessment: Needs assistance   Sitting balance-Leahy Scale: Good       Standing balance-Leahy Scale: Fair Standing balance comment: at leat unilateral UE support                           ADL either performed or assessed with clinical judgement   ADL Overall ADL's : Needs assistance/impaired                                       General ADL Comments: SETUP for seated UB ADLs, MIN/MOD A for seated LB ADLs. Pt requires MIN A for SPS ADL transfers.     Vision Patient Visual Report: No change from baseline       Perception     Praxis      Pertinent Vitals/Pain Pain Assessment: Faces Faces Pain Scale: Hurts little more Pain Location: L LE Pain Descriptors / Indicators: Discomfort;Sore Pain Intervention(s): Limited activity within patient's tolerance;Monitored during session     Hand Dominance Right   Extremity/Trunk Assessment Upper Extremity Assessment Upper Extremity Assessment: Generalized weakness   Lower Extremity Assessment Lower Extremity  Assessment: RLE deficits/detail;LLE deficits/detail RLE Deficits / Details: ROM mostly WFL, some weakness with hip flexion. LLE Deficits / Details: decreased baseline ROM in L LE, decreased ability to flex/rotate hip for LB ADLs       Communication Communication Communication: No difficulties   Cognition Arousal/Alertness: Awake/alert Behavior During Therapy: WFL for tasks assessed/performed Overall Cognitive Status: Within Functional Limits for tasks assessed                                     General Comments       Exercises Other Exercises Other Exercises: OT ed re:  importance of OOB activity, role in acute setting.   Shoulder Instructions      Home Living Family/patient expects to be discharged to:: Private residence Living Arrangements: Alone Available Help at Discharge: Family;Available PRN/intermittently Type of Home: House Home Access: Level entry     Home Layout: One level     Bathroom Shower/Tub: Occupational psychologist: Standard     Home Equipment: Environmental consultant - 2 wheels;Shower seat;Walker - 4 wheels   Additional Comments: Lift Chair      Prior Functioning/Environment Level of Independence: Needs assistance  Gait / Transfers Assistance Needed: as of PT note from [facility], pt was amb with rollator versus RW, but pt reports to OT this date that he has primarily been using w/c in the home and community of late d/t leg length discrepancy (reports he was to get shoe insert from OPPT, but that hasn't happened yet). ADL's / Homemaking Assistance Needed: is independent or mod I with all ADLs and some IADLs. Does his own laundry and meal prep. Needs help for housework, reports his kids help him clean and for repairs   Comments: Pt reports driving himself in a small pickup truck. States he is able to pick his walker up and set in the bed of the truck. States his L LE is the primarily problematic jonit and he is otherwise able to drive well with use of R LE. Pt was seeing OPPT in [place] as of [facility], has had several falls.        OT Problem List: Decreased strength;Decreased range of motion;Decreased activity tolerance;Cardiopulmonary status limiting activity;Increased edema      OT Treatment/Interventions: Self-care/ADL training;DME and/or AE instruction;Therapeutic activities;Balance training;Therapeutic exercise;Patient/family education;Energy conservation    OT Goals(Current goals can be found in the care plan section) Acute Rehab OT Goals Patient Stated Goal: to go home OT Goal Formulation: With patient Time For Goal Achievement:  02/15/21 Potential to Achieve Goals: Good ADL Goals Pt Will Perform Lower Body Dressing: with supervision;sit to/from stand Pt Will Transfer to Toilet: with supervision;ambulating (RW to/from restroom as needed with rest breaks standing as needed for fxl mobility) Pt Will Perform Toileting - Clothing Manipulation and hygiene: with supervision;sitting/lateral leans Pt/caregiver will Perform Home Exercise Program: Increased strength;Both right and left upper extremity;With minimal assist  OT Frequency: Min 1X/week   Barriers to D/C:            Co-evaluation              AM-PAC OT "6 Clicks" Daily Activity     Outcome Measure Help from another person eating meals?: None Help from another person taking care of personal grooming?: A Little Help from another person toileting, which includes using toliet, bedpan, or urinal?: A Little Help from another person bathing (including washing, rinsing, drying)?:  A Lot Help from another person to put on and taking off regular upper body clothing?: A Little Help from another person to put on and taking off regular lower body clothing?: A Lot 6 Click Score: 17   End of Session Equipment Utilized During Treatment: Gait belt;Oxygen Nurse Communication: Mobility status  Activity Tolerance: Patient tolerated treatment well Patient left: in chair;with call bell/phone within reach;with chair alarm set  OT Visit Diagnosis: Unsteadiness on feet (R26.81);Muscle weakness (generalized) (M62.81)                Time: 0159-9689 OT Time Calculation (min): 26 min Charges:  OT General Charges $OT Visit: 1 Visit OT Evaluation $OT Eval Moderate Complexity: 1 Mod OT Treatments $Self Care/Home Management : 8-22 mins  Gerrianne Scale, MS, OTR/L ascom [phone] 02/01/21, 6:52 PM

## 2021-02-01 NOTE — Progress Notes (Signed)
Patient Name: [name] Date of Encounter: 02/01/2021  Hospital Problem List     Active Problems:   CHF exacerbation (HCC)   CHF (congestive heart failure), NYHA class IV, acute on chronic, systolic (HCC)   Unstageable pressure ulcer of sacral region [facility])    Patient Profile     87 y.o. male with history of chronic systolic heart failure, chronic kidney disease stage IV, coronary artery disease status post coronary artery bypass grafting as well as tissue prosthesis aortic valve replacement, hypertension who presented to emergency room with increasing shortness of breath and was noted to have sinus tachycardia.  Troponin was drawn and was mildly elevated.  EKG showed atrial fibrillation with variable ventricular response with intermittent bundle branch block.  Troponin was drawn which showed a value of 149.  Somewhat elevated pro troponins 4 months ago.  BNP is greater than 4500.  Was 3448 4 months ago.  Acute on chronic renal insufficiency with a creatinine of 3.43 BUN of 91 increased from 2.314 months ago.  Baseline appears to be around 2.  As an outpatient he is treated with enteric-coated aspirin, pravastatin 20 mg daily, torsemide 40 mg daily, and states he has been compliant with his medications.  Chest x-ray revealed pulmonary vascular congestion.  Is of note he had a thoracentesis done 4 days ago.  He had 400 cc of pleural fluid removed from the right lung space. Review of previous ekgs suggest at least intermittant nonsustained afib. Has eipsodes of wide complex beats which are irregular and appear to be abbarant  Conducted beats. No evidence of sustained VT.   Subjective   Still short of breath  Inpatient Medications    . aspirin EC  81 mg Oral BID  . fluticasone  1 spray Each Nare Daily  . furosemide  40 mg Intravenous BID  . heparin  5,000 Units Subcutaneous Q8H  . sodium bicarbonate  1,300 mg Oral BID  . sodium chloride flush  3 mL Intravenous Q12H    Vital Signs     Vitals:   02/01/21 1032 02/01/21 1043 02/01/21 1048 02/01/21 1100  BP: 101/79 (!) 117/102 105/84 114/86  Pulse:    66  Resp: 17 10  20   Temp:    (!) 97.4 F (36.3 C)  TempSrc:    Oral  SpO2:    100%  Weight:      Height:        Intake/Output Summary (Last 24 hours) at 02/01/2021 1157 Last data filed at 02/01/2021 1100 Gross per 24 hour  Intake 744.55 ml  Output 350 ml  Net 394.55 ml   Filed Weights   01/31/21 1104 02/01/21 0721  Weight: 72.6 kg 70.9 kg    Physical Exam    GEN: Chronic acutely ill-appearing male.  HEENT: normal.  Neck: Supple, no JVD, carotid bruits, or masses. Cardiac: Irregularly irregular Respiratory: Bilateral rhonchi and rales. GI: Soft, nontender, nondistended, BS + x 4. MS: no deformity or atrophy. Skin: warm and dry, no rash. Neuro:  Strength and sensation are intact. Psych: Normal affect.  Labs    CBC Recent Labs    01/31/21 1109  WBC 7.7  NEUTROABS 5.7  HGB 14.6  HCT 42.2  MCV 91.5  PLT 093   Basic Metabolic Panel Recent Labs    01/31/21 1109 02/01/21 0456  NA 138 140  K 3.7 3.8  CL 94* 99  CO2 25 25  GLUCOSE 117* 113*  BUN 91* 99*  CREATININE 3.43*  3.41*  CALCIUM 9.3 8.9  MG  --  3.2*   Liver Function Tests Recent Labs    01/31/21 1109  AST 92*  ALT 77*  ALKPHOS 137*  BILITOT 3.0*  PROT 6.9  ALBUMIN 3.7   No results for input(s): LIPASE, AMYLASE in the last 72 hours. Cardiac Enzymes No results for input(s): CKTOTAL, CKMB, CKMBINDEX, TROPONINI in the last 72 hours. BNP Recent Labs    01/31/21 1109  BNP >4,500.0*   D-Dimer No results for input(s): DDIMER in the last 72 hours. Hemoglobin A1C No results for input(s): HGBA1C in the last 72 hours. Fasting Lipid Panel No results for input(s): CHOL, HDL, LDLCALC, TRIG, CHOLHDL, LDLDIRECT in the last 72 hours. Thyroid Function Tests Recent Labs    01/31/21 1109  TSH 7.914*    Telemetry    Probable atrial fibrillation with variable ventricular  response.  Aberrant beats.  ECG    Atrial fibrillation with variable ventricular response  Radiology    DG Chest 1 View  Result Date: 01/27/2021 CLINICAL DATA:  87 year old male with history of CHF status post left thoracentesis. EXAM: CHEST  1 VIEW COMPARISON:  01/25/2021 FINDINGS: Stable cardiomediastinal silhouette with unchanged moderate cardiomegaly. Persistent but decreased blunting of the right costophrenic angle. Persistent passive in cicatricial atelectasis in the right lower lobe. Similar appearing left costophrenic angle blunting and mild retrocardiac opacities. No evidence pneumothorax. No new focal consolidations. Atherosclerotic calcification of the aortic arch. Median sternotomy wires remain in place and appear intact in addition to postsurgical changes after coronary artery bypass and aortic valve replacement. No acute osseous abnormality. IMPRESSION: Trace residual right pleural effusion after right thoracentesis. No evidence of pneumothorax. Electronically Signed   By: Ruthann Cancer MD   On: 01/27/2021 09:37   DG Chest Port 1 View  Result Date: 01/31/2021 CLINICAL DATA:  Shortness of breath. EXAM: PORTABLE CHEST 1 VIEW COMPARISON:  Chest x-ray dated Jan 27, 2021. FINDINGS: Stable cardiomegaly status post CABG and AVR. Unchanged pulmonary vascular congestion and interstitial thickening. Unchanged small bilateral pleural effusions and bibasilar atelectasis. No pneumothorax. No acute osseous abnormality. IMPRESSION: 1. Unchanged congestive heart failure. Electronically Signed   By: Titus Dubin M.D.   On: 01/31/2021 11:47   DG Chest Port 1 View  Result Date: 01/25/2021 CLINICAL DATA:  CHF, status post left thoracentesis EXAM: PORTABLE CHEST 1 VIEW COMPARISON:  09/09/2020 FINDINGS: Improvement in the left effusion following thoracentesis. No pneumothorax. Small right effusion noted. Heart is enlarged with vascular congestion and basilar atelectasis versus scarring. Aorta  atherosclerotic. Remote coronary bypass changes and cardiac valve replacement. Degenerative changes of the spine. Bones are osteopenic. IMPRESSION: No pneumothorax following left thoracentesis. Electronically Signed   By: Jerilynn Mages.  Shick M.D.   On: 01/25/2021 14:06   ECHOCARDIOGRAM COMPLETE  Result Date: 02/01/2021    ECHOCARDIOGRAM REPORT   Patient Name:   [name] Date of Exam: 01/31/2021 Medical Rec #:  [id number]       Height:       71.0 in Accession #:    [id number]      Weight:       160.0 lb Date of Birth:  Jul 16, 1931       BSA:          1.918 m Patient Age:    57 years        BP:           122/101 mmHg Patient Gender: M  HR:           106 bpm. Exam Location:  [facility] Procedure: 2D Echo, Cardiac Doppler and Color Doppler Indications:     R06.03 Acute Respiratory distress  History:         Patient has no prior history of Echocardiogram examinations.                  Aortic Valve replacement; Risk Factors:Dyslipidemia, Former                  Smoker and Hypertension. Coronary artery disease. Chronic                  kidney disease.  Sonographer:     Wilford Sports Rodgers-Jones Referring Phys:  [id number] [place] Diagnosing Phys: Bartholome Bill MD IMPRESSIONS  1. Left ventricular ejection fraction, by estimation, is <20%. The left ventricle has severely decreased function. The left ventricle has no regional wall motion abnormalities. The left ventricular internal cavity size was mildly dilated. Left ventricular diastolic parameters were normal.  2. Right ventricular systolic function is normal. The right ventricular size is mildly enlarged.  3. Left atrial size was mildly dilated.  4. Right atrial size was mildly dilated.  5. The mitral valve is grossly normal. Mild to moderate mitral valve regurgitation.  6. Tricuspid valve regurgitation is moderate.  7. The aortic valve is grossly normal. Aortic valve regurgitation is trivial. Mild aortic valve stenosis.  8. Aortic dilatation noted. FINDINGS   Left Ventricle: Left ventricular ejection fraction, by estimation, is <20%. The left ventricle has severely decreased function. The left ventricle has no regional wall motion abnormalities. The left ventricular internal cavity size was mildly dilated. There is no left ventricular hypertrophy. Left ventricular diastolic parameters were normal. Right Ventricle: The right ventricular size is mildly enlarged. Right vetricular wall thickness was not well visualized. Right ventricular systolic function is normal. Left Atrium: Left atrial size was mildly dilated. Right Atrium: Right atrial size was mildly dilated. Pericardium: There is no evidence of pericardial effusion. Mitral Valve: The mitral valve is grossly normal. Mild to moderate mitral valve regurgitation. Tricuspid Valve: The tricuspid valve is not well visualized. Tricuspid valve regurgitation is moderate. Aortic Valve: The aortic valve is grossly normal. Aortic valve regurgitation is trivial. Mild aortic stenosis is present. Aortic valve mean gradient measures 8.2 mmHg. Aortic valve peak gradient measures 12.9 mmHg. Aortic valve area, by VTI measures 0.77  cm. Pulmonic Valve: The pulmonic valve was not well visualized. Pulmonic valve regurgitation is mild. Aorta: Aortic dilatation noted. IAS/Shunts: The atrial septum is grossly normal.  LEFT VENTRICLE PLAX 2D LVIDd:         5.88 cm LVIDs:         5.57 cm LV PW:         0.79 cm LV IVS:        0.85 cm LVOT diam:     2.10 cm LV SV:         20 LV SV Index:   10 LVOT Area:     3.46 cm  RIGHT VENTRICLE            IVC RV Basal diam:  5.06 cm    IVC diam: 2.42 cm RV S prime:     7.62 cm/s TAPSE (M-mode): 0.6 cm LEFT ATRIUM             Index       RIGHT ATRIUM  Index LA diam:        4.80 cm 2.50 cm/m  RA Area:     27.90 cm LA Vol (A2C):   98.4 ml 51.31 ml/m RA Volume:   108.00 ml 56.31 ml/m LA Vol (A4C):   82.8 ml 43.17 ml/m LA Biplane Vol: 93.1 ml 48.55 ml/m  AORTIC VALVE AV Area (Vmax):    0.73 cm AV  Area (Vmean):   0.72 cm AV Area (VTI):     0.77 cm AV Vmax:           179.25 cm/s AV Vmean:          133.750 cm/s AV VTI:            0.256 m AV Peak Grad:      12.9 mmHg AV Mean Grad:      8.2 mmHg LVOT Vmax:         37.93 cm/s LVOT Vmean:        27.833 cm/s LVOT VTI:          0.057 m LVOT/AV VTI ratio: 0.22  AORTA Ao Root diam: 4.10 cm MV E velocity: 65.00 cm/s  TRICUSPID VALVE MV A velocity: 35.90 cm/s  TR Peak grad:   30.7 mmHg MV E/A ratio:  1.81        TR Vmax:        277.00 cm/s                             SHUNTS                            Systemic VTI:  0.06 m                            Systemic Diam: 2.10 cm Bartholome Bill MD Electronically signed by Bartholome Bill MD Signature Date/Time: 02/01/2021/6:58:29 AM    Final    US THORACENTESIS ASP PLEURAL SPACE W/IMG GUIDE  Result Date: 01/27/2021 INDICATION: Patient with a history of heart failure and recurrent bilateral pleural effusions. Interventional radiology asked to perform a therapeutic and diagnostic thoracentesis. EXAM: ULTRASOUND GUIDED THORACENTESIS MEDICATIONS: % lidocaine 10 mL COMPLICATIONS: None immediate. PROCEDURE: An ultrasound guided thoracentesis was thoroughly discussed with the patient and questions answered. The benefits, risks, alternatives and complications were also discussed. The patient understands and wishes to proceed with the procedure. Written consent was obtained. Ultrasound was performed to localize and mark an adequate pocket of fluid in the right chest. The area was then prepped and draped in the normal sterile fashion. 1% Lidocaine was used for local anesthesia. Under ultrasound guidance a 6 Fr Safe-T-Centesis catheter was introduced. Thoracentesis was performed. The catheter was removed and a dressing applied. FINDINGS: A total of approximately 400 mL of amber-colored fluid was removed. Samples were sent to the laboratory as requested by the clinical team. IMPRESSION: Successful ultrasound guided right thoracentesis  yielding 400 mL of pleural fluid. Read by: Soyla Dryer, NP Electronically Signed   By: Ruthann Cancer MD   On: 01/27/2021 09:41   US THORACENTESIS ASP PLEURAL SPACE W/IMG GUIDE  Result Date: 01/25/2021 INDICATION: CHF, pleural effusions EXAM: ULTRASOUND GUIDED LEFT THORACENTESIS MEDICATIONS: 1% lidocaine local COMPLICATIONS: None immediate. PROCEDURE: An ultrasound guided thoracentesis was thoroughly discussed with the patient and questions answered. The benefits, risks, alternatives and complications were also discussed. The patient understands and  wishes to proceed with the procedure. Written consent was obtained. Ultrasound was performed to localize and mark an adequate pocket of fluid in the left chest. The area was then prepped and draped in the normal sterile fashion. 1% Lidocaine was used for local anesthesia. Under ultrasound guidance a 6 Fr Safe-T-Centesis catheter was introduced. Thoracentesis was performed. The catheter was removed and a dressing applied. FINDINGS: A total of approximately 500 cc of blood tinged pleural fluid was removed. Samples were sent to the laboratory as requested by the clinical team. IMPRESSION: Successful ultrasound guided left thoracentesis yielding 500 cc of pleural fluid. Electronically Signed   By: Jerilynn Mages.  Shick M.D.   On: 01/25/2021 14:04    Assessment & Plan    87 y.o.malewith history ofchronic systolic heart failure, chronic kidney disease stage IV, coronary artery disease status post coronary artery bypass grafting as well as tissue prosthesis aortic valve replacement, hypertension who presented to emergency room with increasing shortness of breath and was noted to have sinus tachycardia. Troponin was drawn and was mildly elevated. EKG showed atrial fibrillation with variable ventricular response with intermittent bundle branch block. Troponin was drawn which showed a value of 149. Somewhat elevated pro troponins 4 months ago. BNP is greater than 4500.  Was 3448 4 months ago. Acute on chronic renal insufficiency with a creatinine of 3.43 BUN of 91 increased from 2.314 months ago. Baseline appears to be around 2. As an outpatient he is treated with enteric-coated aspirin, pravastatin 20 mg daily, torsemide 40 mg daily, and states he has been compliant with his medications. Chest x-ray revealed pulmonary vascular congestion.   1.  Arrhythmia-appears to be atrial fibrillation.  Very irregular underlying rhythm with baseline artifact limiting sensitivity however this appears to be most likely intermitant  A. fib with variable ventricular response with occasional aberrant conducted beats.Will add amiodarone IV for load.  Will need to consider adding anticoagulation.  2.Pleural effusions-no significant recurrence we will.  We will continue to follow.  3 CHF- Continue with careful diuresis following electrolytes, hemodynamics and renal function.  Echo showed EF around 15 to 20%.  Global hypokinesis.  We will need to attempt to add amiodarone for rhythm.  We will try to add low-dose afterload reduction.  Signed, Javier Docker Terris Bodin MD 02/01/2021, 11:57 AM  Pager: [phone]

## 2021-02-02 ENCOUNTER — Inpatient Hospital Stay: Payer: PPO

## 2021-02-02 DIAGNOSIS — Z515 Encounter for palliative care: Secondary | ICD-10-CM

## 2021-02-02 DIAGNOSIS — R0602 Shortness of breath: Secondary | ICD-10-CM

## 2021-02-02 DIAGNOSIS — I5023 Acute on chronic systolic (congestive) heart failure: Secondary | ICD-10-CM | POA: Diagnosis not present

## 2021-02-02 DIAGNOSIS — Z7189 Other specified counseling: Secondary | ICD-10-CM | POA: Diagnosis not present

## 2021-02-02 DIAGNOSIS — I509 Heart failure, unspecified: Secondary | ICD-10-CM

## 2021-02-02 DIAGNOSIS — Z66 Do not resuscitate: Secondary | ICD-10-CM

## 2021-02-02 LAB — BASIC METABOLIC PANEL
Anion gap: 17 — ABNORMAL HIGH (ref 5–15)
BUN: 113 mg/dL — ABNORMAL HIGH (ref 8–23)
CO2: 24 mmol/L (ref 22–32)
Calcium: 8.7 mg/dL — ABNORMAL LOW (ref 8.9–10.3)
Chloride: 98 mmol/L (ref 98–111)
Creatinine, Ser: 3.67 mg/dL — ABNORMAL HIGH (ref 0.61–1.24)
GFR, Estimated: 15 mL/min — ABNORMAL LOW (ref >60)
Glucose, Bld: 112 mg/dL — ABNORMAL HIGH (ref 70–99)
Potassium: 3.8 mmol/L (ref 3.5–5.1)
Sodium: 139 mmol/L (ref 135–145)

## 2021-02-02 LAB — PROCALCITONIN: Procalcitonin: 0.19 ng/mL

## 2021-02-02 MED ORDER — FUROSEMIDE 10 MG/ML IJ SOLN
4.0000 mg/h | INTRAVENOUS | Status: DC
  Administered 2021-02-02 – 2021-02-03 (×2): 4 mg/h via INTRAVENOUS
  Filled 2021-02-02 (×2): qty 20

## 2021-02-02 MED ORDER — POTASSIUM CHLORIDE CRYS ER 20 MEQ PO TBCR
40.0000 meq | EXTENDED_RELEASE_TABLET | Freq: Once | ORAL | Status: AC
  Administered 2021-02-02: 40 meq via ORAL
  Filled 2021-02-02: qty 2

## 2021-02-02 MED ORDER — MIRTAZAPINE 15 MG PO TABS
15.0000 mg | ORAL_TABLET | Freq: Every day | ORAL | Status: DC
  Administered 2021-02-02 – 2021-02-07 (×6): 15 mg via ORAL
  Filled 2021-02-02 (×6): qty 1

## 2021-02-02 NOTE — Progress Notes (Signed)
PT Cancellation Note  Patient Details Name: [name] MRN: [id number] DOB: May 17, 1931   Cancelled Treatment:    Reason Eval/Treat Not Completed: Other (comment). Pt awake, starting breakfast, visitor at bedside. Pt stated he does not feel well this morning and does not want to get up at this time. Pt stated he has informed his RN. PT to re-attempt as able.    Lieutenant Diego PT, DPT 8:46 AM,02/02/21

## 2021-02-02 NOTE — Consult Note (Signed)
Consultation Note Date: 02/02/2021   Patient Name: [name]  DOB: 25-Apr-1931  MRN: [id number]  Age / Sex: 87 y.o., male   PCP: Baxter Hire, MD Referring Physician: Sidney Ace, MD   REASON FOR CONSULTATION:Establishing goals of care  Palliative Care consult requested for goals of care discussion in this 87 y.o. male with a medical history significant for CKD 3B, coronary artery disease s/p CABG, CHF s/p aortic valve replacement, hyperlipidemia, hypertension, MGUS.  Patient presented to the ED from home with complaints of worsening shortness of breath.  Current pleural effusions s/p thoracentesis days prior to admission.  On work-up patient initially required BiPAP for respiratory support and was given IV Lasix.  Chest x-ray concerning for bilateral pleural effusions.  BNP greater than 4500.  Patient is currently being followed by cardiology.  Clinical Assessment and Goals of Care: I have reviewed medical records including lab results, imaging, Epic notes, and MAR, received report from the bedside RN, and assessed the patient.   I met at the bedside with patient  to discuss diagnosis prognosis, GOC, EOL wishes, disposition and options.  Mr. Shampine is awake, alert and oriented x3.  He is watching TV.  I introduced Palliative Medicine as specialized medical care for people living with serious illness. It focuses on providing relief from the symptoms and stress of a serious illness. The goal is to improve quality of life for both the patient and the family.  He verbalizes understanding and appreciation of support.  We discussed a brief life review of the patient, along with his functional and nutritional status.  Patient states he is widowed and lives in the home alone.  He has 2 children.  He spent most of his life working in [employer].  Prior to admission patient states he was ambulatory with a walker.  Able to drive short distances as needed.  His  children/grandchildren would assist with errands and medical appointments as needed.  He states he is able to independently perform ADLs however endorses significant fatigue and shortness of breath requiring frequent rest breaks to complete task.  He states his appetite has decreased over the past month with some noticeable weight loss.  We discussed His current illness and what it means in the larger context of His on-going co-morbidities. Natural disease trajectory and expectations at EOL were discussed.  Mr. Raden is able to appropriately express his understanding of current illness and comorbidities.  He states he knows he does not have much time left but wishes to continue with the current interventions until he is no longer able.  He verbalizes understanding of poor prognosis.  A detailed discussion was had today regarding advanced directives.  Concepts specific to code status, artifical feeding and hydration, continued IV antibiotics and rehospitalization.  Patient states he does not have advanced directive however his daughter [name] would be his primary medical decision-maker with support from his other daughter [name].  We discussed at length patient's wishes.  He confirms he would not want any heroic measures, no artificial feeding/PEG, and no dialysis.  Patient confirms request for DNR/DNI.  Education provided on MOST form in the setting of no advanced directives.  MOST form completed as requested.  Mr. Chagoya outlined his wishes for the following treatment decisions:  Cardiopulmonary Resuscitation: Do Not Attempt Resuscitation (DNR/No CPR)  Medical Interventions: Limited Additional Interventions: Use medical treatment, IV fluids and cardiac monitoring as indicated, DO NOT USE intubation or mechanical ventilation.  May consider use of less invasive airway support such as BiPAP or CPAP. Also provide comfort measures. Transfer to the hospital if indicated. Avoid intensive care.    Antibiotics: Determine use of limitation of antibiotics when infection occurs  IV Fluids: IV fluids for a defined trial period  Feeding Tube: No feeding tube    The difference between a aggressive medical intervention and a palliative comfort care path were discussed at length. Values and goals of care important to patient and family were attempted to be elicited.   Patient states he wishes to continue with medical interventions however as his health continues to decline he would then focus on his comfort and end-of-life.  He states "when God is ready he will take me and I hope I am at peace".  Emotional support provided.   I discussed the importance of continued conversation with family and their medical providers regarding overall plan of care and treatment options, ensuring decisions are within the context of the patients values and GOCs.  Hospice and Palliative Care services outpatient were explained and offered. Patient and family verbalized their understanding and awareness of both palliative and hospice's goals and philosophy of care.  Patient is in agreement with outpatient palliative as recommended.  He understands care may transition to a more hospice focus of care once ready.  Questions and concerns were addressed. The family was encouraged to call with questions or concerns.  PMT will continue to support holistically as needed.   CODE STATUS: DNR  ADVANCE DIRECTIVES: Primary Decision Maker: Patient   SYMPTOM MANAGEMENT: Per attending  Palliative Prophylaxis:   Delirium Protocol and Frequent Pain Assessment  PSYCHO-SOCIAL/SPIRITUAL:  Support System: Family  Desire for further Chaplaincy support: No  Additional Recommendations (Limitations, Scope, Preferences):  No Artificial Feeding, No Hemodialysis and Continue to treat the treatable, DNR/DNI  Education on hospice/palliative    PAST MEDICAL HISTORY: Past Medical History:  Diagnosis Date  . Anemia   . Aortic  aneurysm ([place])   . Aortic aneurysm ([place])   . Arthritis   . CKD (chronic kidney disease)    ALSO LEFT RENAL MASS  . Coronary artery disease   . Diverticulosis    WITH RUPTURE  . GERD (gastroesophageal reflux disease)   . History of hiatal hernia   . Hypercholesteremia   . Hypertension   . MGUS (monoclonal gammopathy of unknown significance)   . MGUS (monoclonal gammopathy of unknown significance)     ALLERGIES:  is allergic to morphine, oxycodone, carvedilol, iodinated diagnostic agents, and lisinopril.   MEDICATIONS:  Current Facility-Administered Medications  Medication Dose Route Frequency Provider Last Rate Last Admin  . 0.9 %  sodium chloride infusion  250 mL Intravenous PRN Acheampong, Warnell Bureau, MD      . acetaminophen (TYLENOL) tablet 650 mg  650 mg Oral Q4H PRN Acheampong, Warnell Bureau, MD      . albuterol (VENTOLIN HFA) 108 (90 Base) MCG/ACT inhaler 2 puff  2 puff Inhalation Q6H PRN Acheampong, Warnell Bureau, MD      . ALPRAZolam Duanne Moron) tablet 0.25 mg  0.25 mg Oral BID PRN Acheampong, Warnell Bureau, MD      . amiodarone (NEXTERONE PREMIX) 360-4.14 MG/200ML-% (1.8 mg/mL) IV infusion  30 mg/hr Intravenous Continuous Teodoro Spray, MD 16.67 mL/hr at 02/02/21 0300 30 mg/hr at 02/02/21 0300  . aspirin EC tablet 81 mg  81 mg Oral BID Artist Beach, MD   81 mg at 02/02/21 0956  . fluticasone (FLONASE) 50 MCG/ACT nasal  spray 1 spray  1 spray Each Nare Daily Acheampong, Warnell Bureau, MD   1 spray at 02/02/21 0956  . furosemide (LASIX) 200 mg in dextrose 5 % 100 mL (2 mg/mL) infusion  4 mg/hr Intravenous Continuous Teodoro Spray, MD      . heparin injection 5,000 Units  5,000 Units Subcutaneous Q8H Acheampong, Warnell Bureau, MD   5,000 Units at 02/02/21 0723  . losartan (COZAAR) tablet 25 mg  25 mg Oral Daily Teodoro Spray, MD   25 mg at 02/02/21 0956  . melatonin tablet 2.5 mg  2.5 mg Oral QHS PRN Acheampong, Warnell Bureau, MD   2.5 mg at 01/31/21 2016  . menthol-cetylpyridinium (CEPACOL) lozenge 3 mg  1  lozenge Oral PRN Ralene Muskrat B, MD   3 mg at 02/01/21 1501  . ondansetron (ZOFRAN) injection 4 mg  4 mg Intravenous Q6H PRN Acheampong, Warnell Bureau, MD      . sodium bicarbonate tablet 1,300 mg  1,300 mg Oral BID Artist Beach, MD   1,300 mg at 02/02/21 0956  . sodium chloride flush (NS) 0.9 % injection 3 mL  3 mL Intravenous Q12H Acheampong, Warnell Bureau, MD   3 mL at 02/02/21 0957  . sodium chloride flush (NS) 0.9 % injection 3 mL  3 mL Intravenous PRN Acheampong, Warnell Bureau, MD        VITAL SIGNS: BP 110/89 (BP Location: Left Arm)   Pulse 69   Temp 98 F (36.7 C)   Resp 16   Ht _0  (1.803 m)   Wt 70.2 kg   SpO2 99%   BMI 21.58 kg/m  Filed Weights   01/31/21 1104 02/01/21 0721 02/02/21 0821  Weight: 72.6 kg 70.9 kg 70.2 kg    Estimated body mass index is 21.58 kg/m as calculated from the following:   Height as of this encounter: _1  (1.803 m).   Weight as of this encounter: 70.2 kg.  LABS: CBC:    Component Value Date/Time   WBC 7.7 01/31/2021 1109   HGB 14.6 01/31/2021 1109   HGB 12.3 (L) 12/09/2014 1352   HCT 42.2 01/31/2021 1109   HCT 36.7 (L) 12/09/2014 1352   PLT 210 01/31/2021 1109   PLT 189 12/09/2014 1352   Comprehensive Metabolic Panel:    Component Value Date/Time   NA 139 02/02/2021 0452   NA 126 (L) 12/09/2014 1352   K 3.8 02/02/2021 0452   K 4.7 12/09/2014 1352   BUN 113 (H) 02/02/2021 0452   BUN 28 (H) 12/09/2014 1352   CREATININE 3.67 (H) 02/02/2021 0452   CREATININE 1.64 (H) 12/09/2014 1352   ALBUMIN 3.7 01/31/2021 1109     Review of Systems  Constitutional: Positive for fatigue.  Respiratory: Positive for shortness of breath.   Unless otherwise noted, a complete review of systems is negative.  Physical Exam General: NAD, frail chronically-ill appearing Cardiovascular: Irregular, tachycardic Pulmonary: Crackles Abdomen: soft, nontender, + bowel sounds Extremities: Trace edema, no joint deformities Skin: no rashes, warm and  dry Neurological: AAOx4, mood appropriate  Prognosis: Guarded-poor  Discharge Planning:  To Be Determined  Recommendations: . DNR/DNI-as requested and confirmed by patient.  MOST form completed see above. . Continue with current plan of care, treat the treatable.  Patient verbalized understanding of current illness and comorbidities.  He understands prognosis.  Mr. Senne is clear and expressed goals to continue medical interventions however as health continues to decline he would then be more open to focus  on his comfort. . Outpatient palliative support . PMT will continue to support and follow as needed. Please call team line with urgent needs.   Palliative Performance Scale: PPS 30%              Patient expressed understanding and was in agreement with this plan.   Thank you for allowing the Palliative Medicine Team to assist in the care of this patient. Please utilize secure chat with additional questions, if there is no response within 30 minutes please call the above phone number.   Time In: 1015 Time Out: 1110 Time Total: 55 min.   Visit consisted of counseling and education dealing with the complex and emotionally intense issues of symptom management and palliative care in the setting of serious and potentially life-threatening illness.Greater than 50%  of this time was spent counseling and coordinating care related to the above assessment and plan.  Signed by:  Alda Lea, AGPCNP-BC Palliative Medicine Team  Phone: [phone] Pager: [phone] Amion: [facility] Team providers are available by phone from 7am to 7pm daily and can be reached through the team cell phone.  Should this patient require assistance outside of these hours, please call the patient's attending physician.

## 2021-02-02 NOTE — Progress Notes (Signed)
PROGRESS NOTE    [name]  DGU:[id number] DOB: [birth year] DOA: 01/31/2021 PCP: [place] Hire, MD   Brief Narrative: 87 y.o. male with known history of MGUS, chronic kidney disease stage IIIb, coronary disease status post CABG in the past, HFrEF, status post aortic valve replacement, hypertension and hyperlipidemia.  Patient has had recurrent pleural effusion and is status post recent thoracentesis a few days ago.  He presents to the emergency room on account of progressive worsening dyspnea.  He also admitted to worsening lower extremity edema.  He admits compliance with his medication including diuretics with no significant improvement.  He has developed significant dyspnea with minimal exertion.  Seen by cardiology.  Remains symptomatic.  EF 15 to 20% on repeat echocardiogram.  BNP markedly elevated.  Minimal response to diuretics.  Case discussed with cardiology and nephrology.  Possible plan for Lasix GTT.     Assessment & Plan:   Active Problems:   CHF exacerbation (HCC)   CHF (congestive heart failure), NYHA class IV, acute on chronic, systolic (HCC)   Unstageable pressure ulcer of sacral region ([place])  Acute hypoxic respiratory failure Acute decompensated systolic congestive heart failure Cardiology following We will attempt to optimize volume status As may be challenging considering worsening of kidney function A EF 15 to 20% Plan: Nephrology considering Lasix GTT.  Defer decision to them and cardiology. Palliative care consultation for [place] oxygen as tolerated  Tachyarrhythmia Suspected atrial fibrillation with aberrancy Cardiology following Started on amiodarone Monitor electrolytes  Acute kidney injury on chronic kidney disease stage IIIb Worsening over interval.  Cardiorenal syndrome versus progression of underlying chronic kidney disease in the setting of severe cardiomyopathy.  Nephrology consulted.  Recommendations appreciated.  Bilateral pleural  effusion S/p thoracentesis Cautious use of diuretics  Hypertension Diuretics as above  Anxiety PTA Xanax   DVT prophylaxis: SQ heparin Code Status: Full Family Communication: None today Disposition Plan: Status is: Inpatient  Remains inpatient appropriate because:Inpatient level of care appropriate due to severity of illness   Dispo: The patient is from: Home              Anticipated d/c is to: Unknown at this time.  SNF versus home with home health              Patient currently is not medically stable to d/c.   Difficult to place patient No  Advanced heart failure.  Acute on chronic systolic congestive heart failure.  EF 15 to 20%.  Cardiology and nephrology following.  Palliative care also consulted.  Disposition plan pending.     Level of care: Progressive Cardiac  Consultants:   Cardiology  Nephrology  Procedures:   None  Antimicrobials:   None   Subjective: Patient seen and examined.  More depressive this morning.  Reports persistent shortness of breath.  Objective: Vitals:   02/02/21 0411 02/02/21 0821 02/02/21 1158 02/02/21 1200  BP: 110/83 110/89 (!) 99/59 104/62  Pulse: 64 69 65   Resp: 18 16 16    Temp: 97.9 F (36.6 C) 98 F (36.7 C) 97.9 F (36.6 C)   TempSrc:   Oral   SpO2: 100% 99% 99%   Weight:  70.2 kg    Height:        Intake/Output Summary (Last 24 hours) at 02/02/2021 1322 Last data filed at 02/02/2021 0825 Gross per 24 hour  Intake 442.27 ml  Output 1250 ml  Net -807.73 ml   Filed Weights   01/31/21 1104 02/01/21 0721  02/02/21 0821  Weight: 72.6 kg 70.9 kg 70.2 kg    Examination:  General exam: Appears calm and comfortable  Respiratory system: Normal work of breathing.  Bilateral scattered crackles.  2 L Cardiovascular system: Tachycardic, irregular rhythm, no murmurs Gastrointestinal system: Abdomen is nondistended, soft and nontender. No organomegaly or masses felt. Normal bowel sounds heard. Central nervous  system: Alert and oriented. No focal neurological deficits. Extremities: Symmetric 5 x 5 power. Skin: No rashes, lesions or ulcers Psychiatry: Judgement and insight appear normal. Mood & affect appropriate.     Data Reviewed: I have personally reviewed following labs and imaging studies  CBC: Recent Labs  Lab 01/31/21 1109  WBC 7.7  NEUTROABS 5.7  HGB 14.6  HCT 42.2  MCV 91.5  PLT 841   Basic Metabolic Panel: Recent Labs  Lab 01/31/21 1109 02/01/21 0456 02/02/21 0452  NA 138 140 139  K 3.7 3.8 3.8  CL 94* 99 98  CO2 25 25 24   GLUCOSE 117* 113* 112*  BUN 91* 99* 113*  CREATININE 3.43* 3.41* 3.67*  CALCIUM 9.3 8.9 8.7*  MG  --  3.2*  --    GFR: Estimated Creatinine Clearance: 14.1 mL/min (A) (by C-G formula based on SCr of 3.67 mg/dL (H)). Liver Function Tests: Recent Labs  Lab 01/31/21 1109  AST 92*  ALT 77*  ALKPHOS 137*  BILITOT 3.0*  PROT 6.9  ALBUMIN 3.7   No results for input(s): LIPASE, AMYLASE in the last 168 hours. No results for input(s): AMMONIA in the last 168 hours. Coagulation Profile: No results for input(s): INR, PROTIME in the last 168 hours. Cardiac Enzymes: No results for input(s): CKTOTAL, CKMB, CKMBINDEX, TROPONINI in the last 168 hours. BNP (last 3 results) No results for input(s): PROBNP in the last 8760 hours. HbA1C: No results for input(s): HGBA1C in the last 72 hours. CBG: Recent Labs  Lab 02/01/21 1230 02/01/21 1638  GLUCAP 112* 149*   Lipid Profile: No results for input(s): CHOL, HDL, LDLCALC, TRIG, CHOLHDL, LDLDIRECT in the last 72 hours. Thyroid Function Tests: Recent Labs    01/31/21 1109  TSH 7.914*   Anemia Panel: No results for input(s): VITAMINB12, FOLATE, FERRITIN, TIBC, IRON, RETICCTPCT in the last 72 hours. Sepsis Labs: Recent Labs  Lab 02/02/21 0452  PROCALCITON 0.19    Recent Results (from the past 240 hour(s))  Fungus Culture With Stain     Status: None (Preliminary result)   Collection Time:  01/25/21  1:24 PM   Specimen: PATH Cytology Pleural fluid  Result Value Ref Range Status   Fungus Stain Final report  Final    Comment: (NOTE) Performed At: [facility] [place], [place] [id number] Rush Farmer MD FU:[id number]    Fungus (Mycology) Culture PENDING  Incomplete   Fungal Source PLEURAL  Final    Comment: Performed at [facility], [address] [place], [place] [address]  Acid Fast Smear (AFB)     Status: None   Collection Time: 01/25/21  1:24 PM   Specimen: PATH Cytology Pleural fluid  Result Value Ref Range Status   AFB Specimen Processing Concentration  Final   Acid Fast Smear Negative  Final    Comment: (NOTE) Performed At: [facility] [place], [place] [id number] Rush Farmer MD BT:[id number]    Source (AFB) PLEURAL  Final    Comment: Performed at [facility], [address], [place], [place] [address]  Body fluid culture w Gram Stain  Status: None   Collection Time: 01/25/21  1:24 PM   Specimen: PATH Cytology Pleural fluid  Result Value Ref Range Status   Specimen Description   Final    PLEURAL Performed at [facility], [address] [place], [place] [address]    Special Requests   Final    NONE Performed at [facility], [address] [place], [place] [address]    Gram Stain   Final    FEW WBC PRESENT, PREDOMINANTLY MONONUCLEAR NO ORGANISMS SEEN    Culture   Final    NO GROWTH Performed at [facility], [place] [address], [place], [place] [address]    Report Status 01/29/2021 FINAL  Final  Fungus Culture Result     Status: None   Collection Time: 01/25/21  1:24 PM  Result Value Ref Range Status   Result 1 Comment  Final    Comment: (NOTE) KOH/Calcofluor preparation:  no fungus observed. Performed At: [facility] [place], [place] [id number] Rush Farmer MD GQ:[id number]   Fungus Culture With Stain     Status: None  (Preliminary result)   Collection Time: 01/27/21  8:50 AM   Specimen: PATH Cytology Pleural fluid  Result Value Ref Range Status   Fungus Stain Final report  Final    Comment: (NOTE) Performed At: [facility] [place], [place] [id number] Rush Farmer MD PH:[id number]    Fungus (Mycology) Culture PENDING  Incomplete   Fungal Source PLEURAL  Final    Comment: Performed at [facility], [address] [place], [place] [address]  Acid Fast Smear (AFB)     Status: None   Collection Time: 01/27/21  8:50 AM   Specimen: PATH Cytology Pleural fluid  Result Value Ref Range Status   AFB Specimen Processing Concentration  Final   Acid Fast Smear Negative  Final    Comment: (NOTE) Performed At: [facility] [place], [place] [id number] Rush Farmer MD ML:[id number]    Source (AFB) PLEURAL  Final    Comment: Performed at [facility], [address] [place], [place] [address]  Body fluid culture w Gram Stain     Status: None   Collection Time: 01/27/21  8:50 AM   Specimen: PATH Cytology Pleural fluid  Result Value Ref Range Status   Specimen Description   Final    PLEURAL Performed at [facility], [address], [place], [place] [address]    Special Requests   Final    NONE Performed at [facility], [address] [place], [place] [address]    Gram Stain   Final    FEW WBC PRESENT, PREDOMINANTLY MONONUCLEAR NO ORGANISMS SEEN    Culture   Final    NO GROWTH 3 DAYS Performed at [facility], [place] [address] [place], [place] [address]    Report Status 01/30/2021 FINAL  Final  Fungus Culture Result     Status: None   Collection Time: 01/27/21  8:50 AM  Result Value Ref Range Status   Result 1 Comment  Final    Comment: (NOTE) KOH/Calcofluor preparation:  no fungus observed. Performed At: [facility] [place], [place] [id number] Rush Farmer MD  [id number]   Resp Panel by RT-PCR (Flu A&B, Covid) Nasopharyngeal Swab     Status: None   Collection Time: 01/31/21 11:49 AM   Specimen: Nasopharyngeal Swab; Nasopharyngeal(NP) swabs in vial transport medium  Result Value  Ref Range Status   SARS Coronavirus 2 by RT PCR NEGATIVE NEGATIVE Final    Comment: (NOTE) SARS-CoV-2 target nucleic acids are NOT DETECTED.  The SARS-CoV-2 RNA is generally detectable in upper respiratory specimens during the acute phase of infection. The lowest concentration of SARS-CoV-2 viral copies this assay can detect is 138 copies/mL. A negative result does not preclude SARS-Cov-2 infection and should not be used as the sole basis for treatment or other patient management decisions. A negative result may occur with  improper specimen collection/handling, submission of specimen other than nasopharyngeal swab, presence of viral mutation(s) within the areas targeted by this assay, and inadequate number of viral copies(<138 copies/mL). A negative result must be combined with clinical observations, patient history, and epidemiological information. The expected result is Negative.  Fact Sheet for Patients:  EntrepreneurPulse.com.au  Fact Sheet for Healthcare Providers:  IncredibleEmployment.be  This test is no t yet approved or cleared by the [place] FDA and  has been authorized for detection and/or diagnosis of SARS-CoV-2 by FDA under an Emergency Use Authorization (EUA). This EUA will remain  in effect (meaning this test can be used) for the duration of the COVID-19 declaration under Section 564(b)(1) of the Act, 21 U.S.C.section 360bbb-3(b)(1), unless the authorization is terminated  or revoked sooner.       Influenza A by PCR NEGATIVE NEGATIVE Final   Influenza B by PCR NEGATIVE NEGATIVE Final    Comment: (NOTE) The Xpert Xpress SARS-CoV-2/FLU/RSV plus assay is intended as an aid in the diagnosis of  influenza from Nasopharyngeal swab specimens and should not be used as a sole basis for treatment. Nasal washings and aspirates are unacceptable for Xpert Xpress SARS-CoV-2/FLU/RSV testing.  Fact Sheet for Patients: EntrepreneurPulse.com.au  Fact Sheet for Healthcare Providers: IncredibleEmployment.be  This test is not yet approved or cleared by the [place] FDA and has been authorized for detection and/or diagnosis of SARS-CoV-2 by FDA under an Emergency Use Authorization (EUA). This EUA will remain in effect (meaning this test can be used) for the duration of the COVID-19 declaration under Section 564(b)(1) of the Act, 21 U.S.C. section 360bbb-3(b)(1), unless the authorization is terminated or revoked.  Performed at [facility], [address], [place], [place] [address]          Radiology Studies: [facility] Chest [place] 1 View  Result Date: 02/02/2021 CLINICAL DATA:  Shortness of breath EXAM: PORTABLE CHEST 1 VIEW COMPARISON:  Jan 31, 2021 FINDINGS: There is consolidation in a portion of the left base. There is a partially loculated pleural effusion on the left. There is a smaller partially loculated pleural effusion on the right. There is cardiomegaly with pulmonary vascularity within normal limits. Patient is status post coronary artery bypass grafting and aortic valve replacement. No adenopathy. There is aortic atherosclerosis. No bone lesions. IMPRESSION: Airspace opacity concerning for pneumonia with atelectasis left lower lobe. Loculated pleural effusion on each side, slightly larger on the left the right. Stable cardiomegaly with postoperative changes. Aortic Atherosclerosis (ICD10-I70.0). Electronically Signed   By: Lowella Grip III M.D.   On: 02/02/2021 08:15   ECHOCARDIOGRAM COMPLETE  Result Date: 02/01/2021    ECHOCARDIOGRAM REPORT   Patient Name:   [name] Date of Exam: 01/31/2021 Medical Rec #:  [id number]        Height:       71.0 in Accession #:    [id number]      Weight:       160.0 lb Date of  Birth:  [birth year]       BSA:          1.918 m Patient Age:    42 years        BP:           122/101 mmHg Patient Gender: M               HR:           106 bpm. Exam Location:  [facility] Procedure: 2D Echo, Cardiac Doppler and Color Doppler Indications:     R06.03 Acute Respiratory distress  History:         Patient has no prior history of Echocardiogram examinations.                  Aortic Valve replacement; Risk Factors:Dyslipidemia, Former                  Smoker and Hypertension. Coronary artery disease. Chronic                  kidney disease.  Sonographer:     Wilford Sports Rodgers-Jones Referring Phys:  [id number] [place] Diagnosing Phys: Bartholome Bill MD IMPRESSIONS  1. Left ventricular ejection fraction, by estimation, is <20%. The left ventricle has severely decreased function. The left ventricle has no regional wall motion abnormalities. The left ventricular internal cavity size was mildly dilated. Left ventricular diastolic parameters were normal.  2. Right ventricular systolic function is normal. The right ventricular size is mildly enlarged.  3. Left atrial size was mildly dilated.  4. Right atrial size was mildly dilated.  5. The mitral valve is grossly normal. Mild to moderate mitral valve regurgitation.  6. Tricuspid valve regurgitation is moderate.  7. The aortic valve is grossly normal. Aortic valve regurgitation is trivial. Mild aortic valve stenosis.  8. Aortic dilatation noted. FINDINGS  Left Ventricle: Left ventricular ejection fraction, by estimation, is <20%. The left ventricle has severely decreased function. The left ventricle has no regional wall motion abnormalities. The left ventricular internal cavity size was mildly dilated. There is no left ventricular hypertrophy. Left ventricular diastolic parameters were normal. Right Ventricle: The right ventricular size is mildly enlarged. Right vetricular wall  thickness was not well visualized. Right ventricular systolic function is normal. Left Atrium: Left atrial size was mildly dilated. Right Atrium: Right atrial size was mildly dilated. Pericardium: There is no evidence of pericardial effusion. Mitral Valve: The mitral valve is grossly normal. Mild to moderate mitral valve regurgitation. Tricuspid Valve: The tricuspid valve is not well visualized. Tricuspid valve regurgitation is moderate. Aortic Valve: The aortic valve is grossly normal. Aortic valve regurgitation is trivial. Mild aortic stenosis is present. Aortic valve mean gradient measures 8.2 mmHg. Aortic valve peak gradient measures 12.9 mmHg. Aortic valve area, by VTI measures 0.77  cm. Pulmonic Valve: The pulmonic valve was not well visualized. Pulmonic valve regurgitation is mild. Aorta: Aortic dilatation noted. IAS/Shunts: The atrial septum is grossly normal.  LEFT VENTRICLE PLAX 2D LVIDd:         5.88 cm LVIDs:         5.57 cm LV PW:         0.79 cm LV IVS:        0.85 cm LVOT diam:     2.10 cm LV SV:         20 LV SV Index:   10 LVOT Area:     3.46 cm  RIGHT VENTRICLE  IVC RV Basal diam:  5.06 cm    IVC diam: 2.42 cm RV S prime:     7.62 cm/s TAPSE (M-mode): 0.6 cm LEFT ATRIUM             Index       RIGHT ATRIUM           Index LA diam:        4.80 cm 2.50 cm/m  RA Area:     27.90 cm LA Vol (A2C):   98.4 ml 51.31 ml/m RA Volume:   108.00 ml 56.31 ml/m LA Vol (A4C):   82.8 ml 43.17 ml/m LA Biplane Vol: 93.1 ml 48.55 ml/m  AORTIC VALVE AV Area (Vmax):    0.73 cm AV Area (Vmean):   0.72 cm AV Area (VTI):     0.77 cm AV Vmax:           179.25 cm/s AV Vmean:          133.750 cm/s AV VTI:            0.256 m AV Peak Grad:      12.9 mmHg AV Mean Grad:      8.2 mmHg LVOT Vmax:         37.93 cm/s LVOT Vmean:        27.833 cm/s LVOT VTI:          0.057 m LVOT/AV VTI ratio: 0.22  AORTA Ao Root diam: 4.10 cm MV E velocity: 65.00 cm/s  TRICUSPID VALVE MV A velocity: 35.90 cm/s  TR Peak grad:    30.7 mmHg MV E/A ratio:  1.81        TR Vmax:        277.00 cm/s                             SHUNTS                            Systemic VTI:  0.06 m                            Systemic Diam: 2.10 cm Bartholome Bill MD Electronically signed by Bartholome Bill MD Signature Date/Time: 02/01/2021/6:58:29 AM    Final         Scheduled Meds: . aspirin EC  81 mg Oral BID  . fluticasone  1 spray Each Nare Daily  . heparin  5,000 Units Subcutaneous Q8H  . losartan  25 mg Oral Daily  . mirtazapine  15 mg Oral QHS  . sodium bicarbonate  1,300 mg Oral BID  . sodium chloride flush  3 mL Intravenous Q12H   Continuous Infusions: . sodium chloride    . amiodarone 30 mg/hr (02/02/21 0300)  . furosemide (LASIX) 200 mg in dextrose 5% 100 mL (2mg /mL) infusion 4 mg/hr (02/02/21 1214)     LOS: 2 days    Time spent: 25 minutes    Sidney Ace, MD [facility] Pager [phone]  If 7PM-7AM, please contact night-coverage 02/02/2021, 1:22 PM

## 2021-02-02 NOTE — Progress Notes (Signed)
Patient Name: [name] Date of Encounter: 02/02/2021  Hospital Problem List     Active Problems:   CHF exacerbation (HCC)   CHF (congestive heart failure), NYHA class IV, acute on chronic, systolic (HCC)   Unstageable pressure ulcer of sacral region [name])    Patient Profile     87 y.o.malewith history ofchronic systolic heart failure, chronic kidney disease stage IV, coronary artery disease status post coronary artery bypass grafting as well as tissue prosthesis aortic valve replacement, hypertension who presented to emergency room with increasing shortness of breath and was noted to have sinus tachycardia. Troponin was drawn and was mildly elevated. EKG showed atrial fibrillation with variable ventricular response with intermittent bundle branch block. Troponin was drawn which showed a value of 149. Somewhat elevated pro troponins 4 months ago. BNP is greater than 4500. Was 3448 4 months ago. Acute on chronic renal insufficiency with a creatinine of 3.43 BUN of 91 increased from 2.314 months ago. Baseline appears to be around 2. As an outpatient he is treated with enteric-coated aspirin, pravastatin 20 mg daily, torsemide 40 mg daily, and states he has been compliant with his medications. Chest x-ray revealed pulmonary vascular congestion. Is of note he had a thoracentesis done 4 days ago. He had 400 cc of pleural fluid removed from the right lung space.  Subjective   Complains of shortness of breath  Inpatient Medications    . aspirin EC  81 mg Oral BID  . fluticasone  1 spray Each Nare Daily  . heparin  5,000 Units Subcutaneous Q8H  . losartan  25 mg Oral Daily  . mirtazapine  15 mg Oral QHS  . sodium bicarbonate  1,300 mg Oral BID  . sodium chloride flush  3 mL Intravenous Q12H    Vital Signs    Vitals:   02/02/21 0411 02/02/21 0821 02/02/21 1158 02/02/21 1200  BP: 110/83 110/89 (!) 99/59 104/62  Pulse: 64 69 65   Resp: 18 16 16    Temp: 97.9 F (36.6  C) 98 F (36.7 C) 97.9 F (36.6 C)   TempSrc:   Oral   SpO2: 100% 99% 99%   Weight:  70.2 kg    Height:        Intake/Output Summary (Last 24 hours) at 02/02/2021 1346 Last data filed at 02/02/2021 0825 Gross per 24 hour  Intake 442.27 ml  Output 1250 ml  Net -807.73 ml   Filed Weights   01/31/21 1104 02/01/21 0721 02/02/21 0821  Weight: 72.6 kg 70.9 kg 70.2 kg    Physical Exam    GEN: Well nourished, well developed, in no acute distress.  HEENT: normal.  Neck: Supple, no JVD, carotid bruits, or masses. Cardiac: RRR, no murmurs, rubs, or gallops. No clubbing, cyanosis, edema.  Radials/DP/PT 2+ and equal bilaterally.  Respiratory:  Respirations regular and unlabored, clear to auscultation bilaterally. GI: Soft, nontender, nondistended, BS + x 4. MS: no deformity or atrophy. Skin: warm and dry, no rash. Neuro:  Strength and sensation are intact. Psych: Normal affect.  Labs    CBC Recent Labs    01/31/21 1109  WBC 7.7  NEUTROABS 5.7  HGB 14.6  HCT 42.2  MCV 91.5  PLT 242   Basic Metabolic Panel Recent Labs    02/01/21 0456 02/02/21 0452  NA 140 139  K 3.8 3.8  CL 99 98  CO2 25 24  GLUCOSE 113* 112*  BUN 99* 113*  CREATININE 3.41* 3.67*  CALCIUM 8.9 8.7*  MG 3.2*  --    Liver Function Tests Recent Labs    01/31/21 1109  AST 92*  ALT 77*  ALKPHOS 137*  BILITOT 3.0*  PROT 6.9  ALBUMIN 3.7   No results for input(s): LIPASE, AMYLASE in the last 72 hours. Cardiac Enzymes No results for input(s): CKTOTAL, CKMB, CKMBINDEX, TROPONINI in the last 72 hours. BNP Recent Labs    01/31/21 1109  BNP >4,500.0*   D-Dimer No results for input(s): DDIMER in the last 72 hours. Hemoglobin A1C No results for input(s): HGBA1C in the last 72 hours. Fasting Lipid Panel No results for input(s): CHOL, HDL, LDLCALC, TRIG, CHOLHDL, LDLDIRECT in the last 72 hours. Thyroid Function Tests Recent Labs    01/31/21 1109  TSH 7.914*    Telemetry    nsr with  intermitant non sustained runs of wide complex tachycardia  ECG    Radiology    DG Chest 1 View  Result Date: 01/27/2021 CLINICAL DATA:  87 year old male with history of CHF status post left thoracentesis. EXAM: CHEST  1 VIEW COMPARISON:  01/25/2021 FINDINGS: Stable cardiomediastinal silhouette with unchanged moderate cardiomegaly. Persistent but decreased blunting of the right costophrenic angle. Persistent passive in cicatricial atelectasis in the right lower lobe. Similar appearing left costophrenic angle blunting and mild retrocardiac opacities. No evidence pneumothorax. No new focal consolidations. Atherosclerotic calcification of the aortic arch. Median sternotomy wires remain in place and appear intact in addition to postsurgical changes after coronary artery bypass and aortic valve replacement. No acute osseous abnormality. IMPRESSION: Trace residual right pleural effusion after right thoracentesis. No evidence of pneumothorax. Electronically Signed   By: Ruthann Cancer MD   On: 01/27/2021 09:37   DG Chest Port 1 View  Result Date: 02/02/2021 CLINICAL DATA:  Shortness of breath EXAM: PORTABLE CHEST 1 VIEW COMPARISON:  Jan 31, 2021 FINDINGS: There is consolidation in a portion of the left base. There is a partially loculated pleural effusion on the left. There is a smaller partially loculated pleural effusion on the right. There is cardiomegaly with pulmonary vascularity within normal limits. Patient is status post coronary artery bypass grafting and aortic valve replacement. No adenopathy. There is aortic atherosclerosis. No bone lesions. IMPRESSION: Airspace opacity concerning for pneumonia with atelectasis left lower lobe. Loculated pleural effusion on each side, slightly larger on the left the right. Stable cardiomegaly with postoperative changes. Aortic Atherosclerosis (ICD10-I70.0). Electronically Signed   By: Lowella Grip III M.D.   On: 02/02/2021 08:15   DG Chest Port 1  View  Result Date: 01/31/2021 CLINICAL DATA:  Shortness of breath. EXAM: PORTABLE CHEST 1 VIEW COMPARISON:  Chest x-ray dated Jan 27, 2021. FINDINGS: Stable cardiomegaly status post CABG and AVR. Unchanged pulmonary vascular congestion and interstitial thickening. Unchanged small bilateral pleural effusions and bibasilar atelectasis. No pneumothorax. No acute osseous abnormality. IMPRESSION: 1. Unchanged congestive heart failure. Electronically Signed   By: Titus Dubin M.D.   On: 01/31/2021 11:47   DG Chest Port 1 View  Result Date: 01/25/2021 CLINICAL DATA:  CHF, status post left thoracentesis EXAM: PORTABLE CHEST 1 VIEW COMPARISON:  09/09/2020 FINDINGS: Improvement in the left effusion following thoracentesis. No pneumothorax. Small right effusion noted. Heart is enlarged with vascular congestion and basilar atelectasis versus scarring. Aorta atherosclerotic. Remote coronary bypass changes and cardiac valve replacement. Degenerative changes of the spine. Bones are osteopenic. IMPRESSION: No pneumothorax following left thoracentesis. Electronically Signed   By: Jerilynn Mages.  Shick M.D.   On: 01/25/2021 14:06   ECHOCARDIOGRAM COMPLETE  Result Date: 02/01/2021    ECHOCARDIOGRAM REPORT   Patient Name:   [name] Date of Exam: 01/31/2021 Medical Rec #:  [id number]       Height:       71.0 in Accession #:    [id number]      Weight:       160.0 lb Date of Birth:  04-20-1931       BSA:          1.918 m Patient Age:    24 years        BP:           122/101 mmHg Patient Gender: M               HR:           106 bpm. Exam Location:  [facility] Procedure: 2D Echo, Cardiac Doppler and Color Doppler Indications:     R06.03 Acute Respiratory distress  History:         Patient has no prior history of Echocardiogram examinations.                  Aortic Valve replacement; Risk Factors:Dyslipidemia, Former                  Smoker and Hypertension. Coronary artery disease. Chronic                  kidney disease.  Sonographer:      Wilford Sports Rodgers-Jones Referring Phys:  [id number] Artist [place] Diagnosing Phys: Bartholome Bill MD IMPRESSIONS  1. Left ventricular ejection fraction, by estimation, is <20%. The left ventricle has severely decreased function. The left ventricle has no regional wall motion abnormalities. The left ventricular internal cavity size was mildly dilated. Left ventricular diastolic parameters were normal.  2. Right ventricular systolic function is normal. The right ventricular size is mildly enlarged.  3. Left atrial size was mildly dilated.  4. Right atrial size was mildly dilated.  5. The mitral valve is grossly normal. Mild to moderate mitral valve regurgitation.  6. Tricuspid valve regurgitation is moderate.  7. The aortic valve is grossly normal. Aortic valve regurgitation is trivial. Mild aortic valve stenosis.  8. Aortic dilatation noted. FINDINGS  Left Ventricle: Left ventricular ejection fraction, by estimation, is <20%. The left ventricle has severely decreased function. The left ventricle has no regional wall motion abnormalities. The left ventricular internal cavity size was mildly dilated. There is no left ventricular hypertrophy. Left ventricular diastolic parameters were normal. Right Ventricle: The right ventricular size is mildly enlarged. Right vetricular wall thickness was not well visualized. Right ventricular systolic function is normal. Left Atrium: Left atrial size was mildly dilated. Right Atrium: Right atrial size was mildly dilated. Pericardium: There is no evidence of pericardial effusion. Mitral Valve: The mitral valve is grossly normal. Mild to moderate mitral valve regurgitation. Tricuspid Valve: The tricuspid valve is not well visualized. Tricuspid valve regurgitation is moderate. Aortic Valve: The aortic valve is grossly normal. Aortic valve regurgitation is trivial. Mild aortic stenosis is present. Aortic valve mean gradient measures 8.2 mmHg. Aortic valve peak gradient measures 12.9  mmHg. Aortic valve area, by VTI measures 0.77  cm. Pulmonic Valve: The pulmonic valve was not well visualized. Pulmonic valve regurgitation is mild. Aorta: Aortic dilatation noted. IAS/Shunts: The atrial septum is grossly normal.  LEFT VENTRICLE PLAX 2D LVIDd:         5.88 cm LVIDs:         5.57  cm LV PW:         0.79 cm LV IVS:        0.85 cm LVOT diam:     2.10 cm LV SV:         20 LV SV Index:   10 LVOT Area:     3.46 cm  RIGHT VENTRICLE            IVC RV Basal diam:  5.06 cm    IVC diam: 2.42 cm RV S prime:     7.62 cm/s TAPSE (M-mode): 0.6 cm LEFT ATRIUM             Index       RIGHT ATRIUM           Index LA diam:        4.80 cm 2.50 cm/m  RA Area:     27.90 cm LA Vol (A2C):   98.4 ml 51.31 ml/m RA Volume:   108.00 ml 56.31 ml/m LA Vol (A4C):   82.8 ml 43.17 ml/m LA Biplane Vol: 93.1 ml 48.55 ml/m  AORTIC VALVE AV Area (Vmax):    0.73 cm AV Area (Vmean):   0.72 cm AV Area (VTI):     0.77 cm AV Vmax:           179.25 cm/s AV Vmean:          133.750 cm/s AV VTI:            0.256 m AV Peak Grad:      12.9 mmHg AV Mean Grad:      8.2 mmHg LVOT Vmax:         37.93 cm/s LVOT Vmean:        27.833 cm/s LVOT VTI:          0.057 m LVOT/AV VTI ratio: 0.22  AORTA Ao Root diam: 4.10 cm MV E velocity: 65.00 cm/s  TRICUSPID VALVE MV A velocity: 35.90 cm/s  TR Peak grad:   30.7 mmHg MV E/A ratio:  1.81        TR Vmax:        277.00 cm/s                             SHUNTS                            Systemic VTI:  0.06 m                            Systemic Diam: 2.10 cm Bartholome Bill MD Electronically signed by Bartholome Bill MD Signature Date/Time: 02/01/2021/6:58:29 AM    Final    US THORACENTESIS ASP PLEURAL SPACE W/IMG GUIDE  Result Date: 01/27/2021 INDICATION: Patient with a history of heart failure and recurrent bilateral pleural effusions. Interventional radiology asked to perform a therapeutic and diagnostic thoracentesis. EXAM: ULTRASOUND GUIDED THORACENTESIS MEDICATIONS: % lidocaine 10 mL COMPLICATIONS:  None immediate. PROCEDURE: An ultrasound guided thoracentesis was thoroughly discussed with the patient and questions answered. The benefits, risks, alternatives and complications were also discussed. The patient understands and wishes to proceed with the procedure. Written consent was obtained. Ultrasound was performed to localize and mark an adequate pocket of fluid in the right chest. The area was then prepped and draped in the normal sterile fashion. 1% Lidocaine was used for local anesthesia. Under ultrasound  guidance a 6 Fr Safe-T-Centesis catheter was introduced. Thoracentesis was performed. The catheter was removed and a dressing applied. FINDINGS: A total of approximately 400 mL of amber-colored fluid was removed. Samples were sent to the laboratory as requested by the clinical team. IMPRESSION: Successful ultrasound guided right thoracentesis yielding 400 mL of pleural fluid. Read by: Soyla Dryer, NP Electronically Signed   By: Ruthann Cancer MD   On: 01/27/2021 09:41   US THORACENTESIS ASP PLEURAL SPACE W/IMG GUIDE  Result Date: 01/25/2021 INDICATION: CHF, pleural effusions EXAM: ULTRASOUND GUIDED LEFT THORACENTESIS MEDICATIONS: 1% lidocaine local COMPLICATIONS: None immediate. PROCEDURE: An ultrasound guided thoracentesis was thoroughly discussed with the patient and questions answered. The benefits, risks, alternatives and complications were also discussed. The patient understands and wishes to proceed with the procedure. Written consent was obtained. Ultrasound was performed to localize and mark an adequate pocket of fluid in the left chest. The area was then prepped and draped in the normal sterile fashion. 1% Lidocaine was used for local anesthesia. Under ultrasound guidance a 6 Fr Safe-T-Centesis catheter was introduced. Thoracentesis was performed. The catheter was removed and a dressing applied. FINDINGS: A total of approximately 500 cc of blood tinged pleural fluid was removed. Samples  were sent to the laboratory as requested by the clinical team. IMPRESSION: Successful ultrasound guided left thoracentesis yielding 500 cc of pleural fluid. Electronically Signed   By: Jerilynn Mages.  Shick M.D.   On: 01/25/2021 14:04    Assessment & Plan     87 y.o.malewith history ofchronic systolic heart failure, chronic kidney disease stage IV, coronary artery disease status post coronary artery bypass grafting as well as tissue prosthesis aortic valve replacement, hypertension who presented to emergency room with increasing shortness of breath and was noted to have sinus tachycardia. Troponin was drawn and was mildly elevated. EKG showed atrial fibrillation with variable ventricular response with intermittent bundle branch block. Troponin was drawn which showed a value of 149. Somewhat elevated pro troponins 4 months ago. BNP is greater than 4500. Was 3448 4 months ago. Acute on chronic renal insufficiency with a creatinine of 3.43 BUN of 91 increased from 2.314 months ago. Baseline appears to be around 2. As an outpatient he is treated with enteric-coated aspirin, pravastatin 20 mg daily, torsemide 40 mg daily, and states he has been compliant with his medications. Chest x-ray revealed pulmonary vascular congestion.   1. Arrhythmia-has intermittent A. fib with sinus rhythm.  Currently on IV amiodarone drip.  We will continue to load to attempt a 10 g load.  Would then convert to p.o. Somewhat soft blood pressures and tolerating this so far.  2.Pleural effusions-no significant recurrence we will.  We will continue to follow.  3 CHF-has not tolerated IV Lasix due to blood pressure and worsening renal failure.  EF is less than 20%.  Will attempt Lasix drip to see if we can carefully diurese without worsening renal function.  4.  Renal insufficiency-creatinine increased to 3.67 with a BUN of 113.  Baseline appears to be around 2.  Will need nephrology assistance.  Signed, Javier Docker Danayah Smyre  MD 02/02/2021, 1:46 PM  Pager: [phone]

## 2021-02-02 NOTE — Progress Notes (Signed)
[facility]  ROUNDING NOTE   Subjective:   [name] is a 87 y.o. male with past medical history of CAD with CABG, hypertension, hyperlipidemia, aortic valve replacement, heart failure with reduced EF, and CKD stage 3B. He presents to the ED with shortness of breath that has progressed for a while.   He is a current patient of [facility], seen by Dr Holley Raring. He states he missed his appointment earlier this week. He does not use Oxygen at home but feels like he has needed some the past few days. Admits to shortness of breath in activities and rest. Currently on 2L [place]. States he has taken his medications as prescribed and was decreased on his Torsemide recently. Admits to decreased appetite. States he took 1 bit of english muffin this morning. Denies NSAID use, diarrhea, nausea and vomiting. Denies pain  Amiodarone gtt  Objective:  Vital signs in last 24 hours:  Temp:  [97.9 F (36.6 C)-98.5 F (36.9 C)] 97.9 F (36.6 C) (05/19 1158) Pulse Rate:  [63-94] 65 (05/19 1158) Resp:  [10-20] 16 (05/19 1158) BP: (99-118)/(59-91) 104/62 (05/19 1200) SpO2:  [97 %-100 %] 99 % (05/19 1158) Weight:  [70.2 kg] 70.2 kg (05/19 0821)  Weight change: -1.678 kg Filed Weights   01/31/21 1104 02/01/21 0721 02/02/21 0821  Weight: 72.6 kg 70.9 kg 70.2 kg    Intake/Output: I/O last 3 completed shifts: In: 922.3 [P.O.:720; I.V.:202.3] Out: 1000 [Urine:1000]   Intake/Output this shift:  Total I/O In: 240 [P.O.:240] Out: 600 [Urine:600]  Physical Exam: General: NAD, laying in bed  Head: Normocephalic, atraumatic. Moist oral mucosal membranes  Eyes: Anicteric  Lungs:  Fine crackles, 2L [place]  Heart: Regular rate and rhythm  Abdomen:  Soft, nontender,   Extremities:  1+ peripheral edema.  Neurologic: Alert, moving all four extremities  Skin: No lesions       Basic Metabolic Panel: Recent Labs  Lab 01/31/21 1109 02/01/21 0456 02/02/21 0452  NA 138 140 139  K 3.7 3.8 3.8  CL 94* 99  98  CO2 25 25 24   GLUCOSE 117* 113* 112*  BUN 91* 99* 113*  CREATININE 3.43* 3.41* 3.67*  CALCIUM 9.3 8.9 8.7*  MG  --  3.2*  --     Liver Function Tests: Recent Labs  Lab 01/31/21 1109  AST 92*  ALT 77*  ALKPHOS 137*  BILITOT 3.0*  PROT 6.9  ALBUMIN 3.7   No results for input(s): LIPASE, AMYLASE in the last 168 hours. No results for input(s): AMMONIA in the last 168 hours.  CBC: Recent Labs  Lab 01/31/21 1109  WBC 7.7  NEUTROABS 5.7  HGB 14.6  HCT 42.2  MCV 91.5  PLT 210    Cardiac Enzymes: No results for input(s): CKTOTAL, CKMB, CKMBINDEX, TROPONINI in the last 168 hours.  BNP: Invalid input(s): POCBNP  CBG: Recent Labs  Lab 02/01/21 1230 02/01/21 1638  GLUCAP 112* 149*    Microbiology: Results for orders placed or performed during the hospital encounter of 01/31/21  Resp Panel by RT-PCR (Flu A&B, Covid) Nasopharyngeal Swab     Status: None   Collection Time: 01/31/21 11:49 AM   Specimen: Nasopharyngeal Swab; Nasopharyngeal(NP) swabs in vial transport medium  Result Value Ref Range Status   SARS Coronavirus 2 by RT PCR NEGATIVE NEGATIVE Final    Comment: (NOTE) SARS-CoV-2 target nucleic acids are NOT DETECTED.  The SARS-CoV-2 RNA is generally detectable in upper respiratory specimens during the acute phase of infection. The lowest  concentration of SARS-CoV-2 viral copies this assay can detect is 138 copies/mL. A negative result does not preclude SARS-Cov-2 infection and should not be used as the sole basis for treatment or other patient management decisions. A negative result may occur with  improper specimen collection/handling, submission of specimen other than nasopharyngeal swab, presence of viral mutation(s) within the areas targeted by this assay, and inadequate number of viral copies(<138 copies/mL). A negative result must be combined with clinical observations, patient history, and epidemiological information. The expected result is  Negative.  Fact Sheet for Patients:  EntrepreneurPulse.com.au  Fact Sheet for Healthcare Providers:  IncredibleEmployment.be  This test is no t yet approved or cleared by the [place] FDA and  has been authorized for detection and/or diagnosis of SARS-CoV-2 by FDA under an Emergency Use Authorization (EUA). This EUA will remain  in effect (meaning this test can be used) for the duration of the COVID-19 declaration under Section 564(b)(1) of the Act, 21 U.S.C.section 360bbb-3(b)(1), unless the authorization is terminated  or revoked sooner.       Influenza A by PCR NEGATIVE NEGATIVE Final   Influenza B by PCR NEGATIVE NEGATIVE Final    Comment: (NOTE) The Xpert Xpress SARS-CoV-2/FLU/RSV plus assay is intended as an aid in the diagnosis of influenza from Nasopharyngeal swab specimens and should not be used as a sole basis for treatment. Nasal washings and aspirates are unacceptable for Xpert Xpress SARS-CoV-2/FLU/RSV testing.  Fact Sheet for Patients: EntrepreneurPulse.com.au  Fact Sheet for Healthcare Providers: IncredibleEmployment.be  This test is not yet approved or cleared by the [place] FDA and has been authorized for detection and/or diagnosis of SARS-CoV-2 by FDA under an Emergency Use Authorization (EUA). This EUA will remain in effect (meaning this test can be used) for the duration of the COVID-19 declaration under Section 564(b)(1) of the Act, 21 U.S.C. section 360bbb-3(b)(1), unless the authorization is terminated or revoked.  Performed at [facility], [address] [place], [place] [address]     Coagulation Studies: No results for input(s): LABPROT, INR in the last 72 hours.  Urinalysis: No results for input(s): COLORURINE, LABSPEC, PHURINE, GLUCOSEU, HGBUR, BILIRUBINUR, KETONESUR, PROTEINUR, UROBILINOGEN, NITRITE, LEUKOCYTESUR in the last 72 hours.  Invalid  input(s): APPERANCEUR    Imaging: DG Chest Port 1 View  Result Date: 02/02/2021 CLINICAL DATA:  Shortness of breath EXAM: PORTABLE CHEST 1 VIEW COMPARISON:  Jan 31, 2021 FINDINGS: There is consolidation in a portion of the left base. There is a partially loculated pleural effusion on the left. There is a smaller partially loculated pleural effusion on the right. There is cardiomegaly with pulmonary vascularity within normal limits. Patient is status post coronary artery bypass grafting and aortic valve replacement. No adenopathy. There is aortic atherosclerosis. No bone lesions. IMPRESSION: Airspace opacity concerning for pneumonia with atelectasis left lower lobe. Loculated pleural effusion on each side, slightly larger on the left the right. Stable cardiomegaly with postoperative changes. Aortic Atherosclerosis (ICD10-I70.0). Electronically Signed   By: Lowella Grip III M.D.   On: 02/02/2021 08:15   ECHOCARDIOGRAM COMPLETE  Result Date: 02/01/2021    ECHOCARDIOGRAM REPORT   Patient Name:   [name] Date of Exam: 01/31/2021 Medical Rec #:  [id number]       Height:       71.0 in Accession #:    [id number]      Weight:       160.0 lb Date of Birth:  1930/11/07  BSA:          1.918 m Patient Age:    37 years        BP:           122/101 mmHg Patient Gender: M               HR:           106 bpm. Exam Location:  [facility] Procedure: 2D Echo, Cardiac Doppler and Color Doppler Indications:     R06.03 Acute Respiratory distress  History:         Patient has no prior history of Echocardiogram examinations.                  Aortic Valve replacement; Risk Factors:Dyslipidemia, Former                  Smoker and Hypertension. Coronary artery disease. Chronic                  kidney disease.  Sonographer:     Wilford Sports Rodgers-Jones Referring Phys:  [id number] [place] Diagnosing Phys: Bartholome Bill MD IMPRESSIONS  1. Left ventricular ejection fraction, by estimation, is <20%. The left ventricle has  severely decreased function. The left ventricle has no regional wall motion abnormalities. The left ventricular internal cavity size was mildly dilated. Left ventricular diastolic parameters were normal.  2. Right ventricular systolic function is normal. The right ventricular size is mildly enlarged.  3. Left atrial size was mildly dilated.  4. Right atrial size was mildly dilated.  5. The mitral valve is grossly normal. Mild to moderate mitral valve regurgitation.  6. Tricuspid valve regurgitation is moderate.  7. The aortic valve is grossly normal. Aortic valve regurgitation is trivial. Mild aortic valve stenosis.  8. Aortic dilatation noted. FINDINGS  Left Ventricle: Left ventricular ejection fraction, by estimation, is <20%. The left ventricle has severely decreased function. The left ventricle has no regional wall motion abnormalities. The left ventricular internal cavity size was mildly dilated. There is no left ventricular hypertrophy. Left ventricular diastolic parameters were normal. Right Ventricle: The right ventricular size is mildly enlarged. Right vetricular wall thickness was not well visualized. Right ventricular systolic function is normal. Left Atrium: Left atrial size was mildly dilated. Right Atrium: Right atrial size was mildly dilated. Pericardium: There is no evidence of pericardial effusion. Mitral Valve: The mitral valve is grossly normal. Mild to moderate mitral valve regurgitation. Tricuspid Valve: The tricuspid valve is not well visualized. Tricuspid valve regurgitation is moderate. Aortic Valve: The aortic valve is grossly normal. Aortic valve regurgitation is trivial. Mild aortic stenosis is present. Aortic valve mean gradient measures 8.2 mmHg. Aortic valve peak gradient measures 12.9 mmHg. Aortic valve area, by VTI measures 0.77  cm. Pulmonic Valve: The pulmonic valve was not well visualized. Pulmonic valve regurgitation is mild. Aorta: Aortic dilatation noted. IAS/Shunts: The atrial  septum is grossly normal.  LEFT VENTRICLE PLAX 2D LVIDd:         5.88 cm LVIDs:         5.57 cm LV PW:         0.79 cm LV IVS:        0.85 cm LVOT diam:     2.10 cm LV SV:         20 LV SV Index:   10 LVOT Area:     3.46 cm  RIGHT VENTRICLE            IVC  RV Basal diam:  5.06 cm    IVC diam: 2.42 cm RV S prime:     7.62 cm/s TAPSE (M-mode): 0.6 cm LEFT ATRIUM             Index       RIGHT ATRIUM           Index LA diam:        4.80 cm 2.50 cm/m  RA Area:     27.90 cm LA Vol (A2C):   98.4 ml 51.31 ml/m RA Volume:   108.00 ml 56.31 ml/m LA Vol (A4C):   82.8 ml 43.17 ml/m LA Biplane Vol: 93.1 ml 48.55 ml/m  AORTIC VALVE AV Area (Vmax):    0.73 cm AV Area (Vmean):   0.72 cm AV Area (VTI):     0.77 cm AV Vmax:           179.25 cm/s AV Vmean:          133.750 cm/s AV VTI:            0.256 m AV Peak Grad:      12.9 mmHg AV Mean Grad:      8.2 mmHg LVOT Vmax:         37.93 cm/s LVOT Vmean:        27.833 cm/s LVOT VTI:          0.057 m LVOT/AV VTI ratio: 0.22  AORTA Ao Root diam: 4.10 cm MV E velocity: 65.00 cm/s  TRICUSPID VALVE MV A velocity: 35.90 cm/s  TR Peak grad:   30.7 mmHg MV E/A ratio:  1.81        TR Vmax:        277.00 cm/s                             SHUNTS                            Systemic VTI:  0.06 m                            Systemic Diam: 2.10 cm Bartholome Bill MD Electronically signed by Bartholome Bill MD Signature Date/Time: 02/01/2021/6:58:29 AM    Final      Medications:   . sodium chloride    . amiodarone 30 mg/hr (02/02/21 0300)  . furosemide (LASIX) 200 mg in dextrose 5% 100 mL (2mg /mL) infusion 4 mg/hr (02/02/21 1214)   . aspirin EC  81 mg Oral BID  . fluticasone  1 spray Each Nare Daily  . heparin  5,000 Units Subcutaneous Q8H  . losartan  25 mg Oral Daily  . mirtazapine  15 mg Oral QHS  . sodium bicarbonate  1,300 mg Oral BID  . sodium chloride flush  3 mL Intravenous Q12H   sodium chloride, acetaminophen, albuterol, ALPRAZolam, melatonin, menthol-cetylpyridinium,  ondansetron (ZOFRAN) IV, sodium chloride flush  Assessment/ Plan:  [name] is a 87 y.o.  male with past medical history of CAD with CABG, hypertension, hyperlipidemia, aortic valve replacement, heart failure with reduced EF, and CKD stage 3B. He presents to the ED with shortness of breath that has progressed for a while.  1. Acute Kidney Injury on chronic kidney disease stage 3B with baseline creatinine 2.2 and GFR of 28 on 01/04/21.  Acute kidney injury complicated by cardiorenal syndrome Acute  kidney injury secondary to fluid overload No IV contrast exposure Dialysis not indicated at this time Lasix gtt will be beneficial for fluid removal and less stress on renal function Discussed Remeron with primary team to encourage appetite Will monitor renal function  Lab Results  Component Value Date   CREATININE 3.67 (H) 02/02/2021   CREATININE 3.41 (H) 02/01/2021   CREATININE 3.43 (H) 01/31/2021    Intake/Output Summary (Last 24 hours) at 02/02/2021 1413 Last data filed at 02/02/2021 1340 Gross per 24 hour  Intake 682.27 ml  Output 1250 ml  Net -567.73 ml   2. Anemia of chronic kidney disease Lab Results  Component Value Date   HGB 14.6 01/31/2021  Hgb at goal Will monitor  3. Hypertension managed with diurectics Lasix gtt started Can consider Midodrine for BP support Amiodarone gtt A-fib and irregularity     LOS: 2 Trissa Molina 5/19/20222:13 PM

## 2021-02-03 DIAGNOSIS — I5023 Acute on chronic systolic (congestive) heart failure: Secondary | ICD-10-CM | POA: Diagnosis not present

## 2021-02-03 LAB — CBC
HCT: 38.6 % — ABNORMAL LOW (ref 39.0–52.0)
Hemoglobin: 12.9 g/dL — ABNORMAL LOW (ref 13.0–17.0)
MCH: 30.6 pg (ref 26.0–34.0)
MCHC: 33.4 g/dL (ref 30.0–36.0)
MCV: 91.5 fL (ref 80.0–100.0)
Platelets: 141 10*3/uL — ABNORMAL LOW (ref 150–400)
RBC: 4.22 MIL/uL (ref 4.22–5.81)
RDW: 15.2 % (ref 11.5–15.5)
WBC: 13.3 10*3/uL — ABNORMAL HIGH (ref 4.0–10.5)
nRBC: 0 % (ref 0.0–0.2)

## 2021-02-03 LAB — BASIC METABOLIC PANEL
Anion gap: 13 (ref 5–15)
BUN: 105 mg/dL — ABNORMAL HIGH (ref 8–23)
CO2: 27 mmol/L (ref 22–32)
Calcium: 8.6 mg/dL — ABNORMAL LOW (ref 8.9–10.3)
Chloride: 98 mmol/L (ref 98–111)
Creatinine, Ser: 3.4 mg/dL — ABNORMAL HIGH (ref 0.61–1.24)
GFR, Estimated: 17 mL/min — ABNORMAL LOW (ref >60)
Glucose, Bld: 108 mg/dL — ABNORMAL HIGH (ref 70–99)
Potassium: 3.3 mmol/L — ABNORMAL LOW (ref 3.5–5.1)
Sodium: 138 mmol/L (ref 135–145)

## 2021-02-03 MED ORDER — MAGNESIUM SULFATE 2 GM/50ML IV SOLN
2.0000 g | Freq: Once | INTRAVENOUS | Status: AC
  Administered 2021-02-03: 2 g via INTRAVENOUS
  Filled 2021-02-03: qty 50

## 2021-02-03 MED ORDER — AMIODARONE HCL 200 MG PO TABS
200.0000 mg | ORAL_TABLET | Freq: Two times a day (BID) | ORAL | Status: DC
  Administered 2021-02-03 – 2021-02-08 (×11): 200 mg via ORAL
  Filled 2021-02-03 (×11): qty 1

## 2021-02-03 MED ORDER — POTASSIUM CHLORIDE CRYS ER 20 MEQ PO TBCR
40.0000 meq | EXTENDED_RELEASE_TABLET | Freq: Once | ORAL | Status: AC
  Administered 2021-02-03: 40 meq via ORAL
  Filled 2021-02-03: qty 2

## 2021-02-03 NOTE — Plan of Care (Signed)
  Problem: Health Behavior/Discharge Planning: Goal: Ability to manage health-related needs will improve Outcome: Progressing   Problem: Clinical Measurements: Goal: Ability to maintain clinical measurements within normal limits will improve Outcome: Progressing Goal: Will remain free from infection Outcome: Progressing   Problem: Nutrition: Goal: Adequate nutrition will be maintained Outcome: Progressing   Problem: Coping: Goal: Level of anxiety will decrease Outcome: Progressing   

## 2021-02-03 NOTE — Evaluation (Signed)
Physical Therapy Evaluation Patient Details Name: [name] MRN: [id number] DOB: July 18, 1931 Today's Date: 02/03/2021   History of Present Illness  Pt is an 87 y/o M with PMH: MGUS, chronic kidney disease stage IIIb, coronary disease status post CABG in the past, HFrEF, status post aortic valve replacement, hypertension and hyperlipidemia. Pt presented to ED d/t progressive worsening dyspnea and LE edema. Pt adm for CHF exacerbation. EF 15-20% on repeat echo.    Clinical Impression  Pt alert, in chair, ready to transfer back to bed. Pt reported fatigue, no overt complaints of pain (some generalized LE discomfort.) At baseline pt uses walker at home, modI/I for ADLs, some IADLs.   The patient demonstrated sit <> stand with supervision with RW. He was able to take several steps to bed in room ~28ft with CGA. No unsteadiness noted but pt quickly fatigued. MinA to return to supine, repositioned for comfort. Time spent discussing pt current functional status, assist at home, and potential benefit of WC for mobility.  Overall the patient demonstrated deficits (see "PT Problem List") that impede the patient's functional abilities, safety, and mobility and would benefit from skilled PT intervention. Recommendation is HHPT with supervision for mobility/OOB/intermittently, pt adamant about returning home.      Follow Up Recommendations Home health PT;Supervision for mobility/OOB;Supervision - Intermittent    Equipment Recommendations  Wheelchair (measurements PT)    Recommendations for Other Services       Precautions / Restrictions Precautions Precautions: Fall Restrictions Weight Bearing Restrictions: No      Mobility  Bed Mobility Overal bed mobility: Needs Assistance Bed Mobility: Sit to Supine       Sit to supine: Min assist   General bed mobility comments: light minA for LE assist    Transfers Overall transfer level: Needs assistance Equipment used: Rolling walker (2  wheeled) Transfers: Sit to/from Stand Sit to Stand: Supervision            Ambulation/Gait Ambulation/Gait assistance: Min guard Gait Distance (Feet): 3 Feet Assistive device: Rolling walker (2 wheeled)       General Gait Details: pt able to take several steps to bed, no unsteadiness noted  but pt fatigued quickly  Stairs            Wheelchair Mobility    Modified Rankin (Stroke Patients Only)       Balance Overall balance assessment: Needs assistance   Sitting balance-Leahy Scale: Good       Standing balance-Leahy Scale: Fair Standing balance comment: at leat unilateral UE support                             Pertinent Vitals/Pain Pain Assessment: Faces Faces Pain Scale: Hurts a little bit Pain Location: LE with mobility Pain Descriptors / Indicators: Discomfort;Sore Pain Intervention(s): Limited activity within patient's tolerance;Monitored during session;Repositioned    Home Living Family/patient expects to be discharged to:: Private residence Living Arrangements: Alone Available Help at Discharge: Family;Available PRN/intermittently Type of Home: House Home Access: Level entry     Home Layout: One level Home Equipment: Walker - 2 wheels;Shower seat;Walker - 4 wheels Additional Comments: Teacher, English as a foreign language    Prior Function Level of Independence: Needs assistance   Gait / Transfers Assistance Needed: as of PT note from [facility], pt was amb with rollator versus RW, but pt reports to OT this date that he has primarily been using w/c in the home and community of late d/t leg length  discrepancy (reports he was to get shoe insert from OPPT, but that hasn't happened yet).  ADL's / Homemaking Assistance Needed: is independent or mod I with all ADLs and some IADLs. Does his own laundry and meal prep. Needs help for housework, reports his kids help him clean and for repairs  Comments: Pt reports driving himself in a small pickup truck. States he is able to  pick his walker up and set in the bed of the truck. States his L LE is the primarily problematic jonit and he is otherwise able to drive well with use of R LE. Pt was seeing [facility] in [place] as of [facility], has had several falls.     Hand Dominance   Dominant Hand: Right    Extremity/Trunk Assessment   Upper Extremity Assessment Upper Extremity Assessment: Defer to OT evaluation    Lower Extremity Assessment Lower Extremity Assessment: Generalized weakness       Communication   Communication: No difficulties  Cognition Arousal/Alertness: Awake/alert Behavior During Therapy: WFL for tasks assessed/performed Overall Cognitive Status: Within Functional Limits for tasks assessed                                        General Comments      Exercises Other Exercises Other Exercises: potential need for assist at home, benefits of WC if needed at discharge   Assessment/Plan    PT Assessment Patient needs continued PT services  PT Problem List Decreased strength;Decreased mobility;Decreased activity tolerance;Decreased balance       PT Treatment Interventions Therapeutic exercise;DME instruction;Gait training;Stair training;Balance training;Neuromuscular re-education;Functional mobility training;Therapeutic activities;Patient/family education    PT Goals (Current goals can be found in the Care Plan section)  Acute Rehab PT Goals Patient Stated Goal: to go home PT Goal Formulation: With patient Time For Goal Achievement: 02/17/21 Potential to Achieve Goals: Good    Frequency Min 2X/week   Barriers to discharge Decreased caregiver support      Co-evaluation               AM-PAC PT "6 Clicks" Mobility  Outcome Measure Help needed turning from your back to your side while in a flat bed without using bedrails?: A Little Help needed moving from lying on your back to sitting on the side of a flat bed without using bedrails?: A Little Help needed moving to  and from a bed to a chair (including a wheelchair)?: A Little Help needed standing up from a chair using your arms (e.g., wheelchair or bedside chair)?: A Little Help needed to walk in hospital room?: A Little Help needed climbing 3-5 steps with a railing? : A Little 6 Click Score: 18    End of Session Equipment Utilized During Treatment: Gait belt Activity Tolerance: Patient tolerated treatment well Patient left: in bed;with call bell/phone within reach;with bed alarm set Nurse Communication: Mobility status PT Visit Diagnosis: Other abnormalities of gait and mobility (R26.89);Muscle weakness (generalized) (M62.81);Difficulty in walking, not elsewhere classified (R26.2)    Time: 1415-1430 PT Time Calculation (min) (ACUTE ONLY): 15 min   Charges:   PT Evaluation $PT Eval Moderate Complexity: 1 Mod          Lieutenant Diego PT, DPT 3:35 PM,02/03/21

## 2021-02-03 NOTE — Care Management Important Message (Signed)
Important Message  Patient Details  Name: [name] MRN: [id number] Date of Birth: 04-Apr-1931   Medicare Important Message Given:  Yes     Dannette Barbara 02/03/2021, 1:33 PM

## 2021-02-03 NOTE — Progress Notes (Signed)
Pt seen this am. Creatinine slightly better. Still on amiodarone drip. Will attempt to convert to po today at 200 mg bid. Conitnue lasix drip. FUll note to follow.

## 2021-02-03 NOTE — Progress Notes (Addendum)
[facility]  ROUNDING NOTE   Subjective:   [name] is a 87 y.o. male with past medical history of CAD with CABG, hypertension, hyperlipidemia, aortic valve replacement, heart failure with reduced EF, and CKD stage 3B. He presents to the ED with shortness of breath that has progressed for a while.   He is a current patient of [facility], seen by Dr Holley Raring. He states he missed his appointment earlier this week.   Patient seen today resting in bed  Alert and oriented Tolerating breakfast States his breathing has Improved,but is not at baseline Patient seen later sitting up in chair   Objective:  Vital signs in last 24 hours:  Temp:  [97.4 F (36.3 C)-98.7 F (37.1 C)] 97.4 F (36.3 C) (05/20 1204) Pulse Rate:  [65-75] 75 (05/20 1204) Resp:  [17-19] 18 (05/20 1204) BP: (93-117)/(63-83) 98/67 (05/20 1204) SpO2:  [93 %-100 %] 93 % (05/20 1204) Weight:  [68.1 kg] 68.1 kg (05/20 0938)  Weight change: -0.726 kg Filed Weights   02/01/21 0721 02/02/21 0821 02/03/21 0938  Weight: 70.9 kg 70.2 kg 68.1 kg    Intake/Output: I/O last 3 completed shifts: In: 1941 [P.O.:1080; I.V.:638] Out: 2475 [Urine:2475]   Intake/Output this shift:  Total I/O In: 360 [P.O.:360] Out: 350 [Urine:350]  Physical Exam: General: NAD, sitting in chair  Head: Normocephalic, atraumatic. Moist oral mucosal membranes  Eyes: Anicteric  Lungs:  Fine crackles, 2L [place]  Heart: Regular rate and rhythm  Abdomen:  Soft, nontender,   Extremities:  1+ peripheral edema RLE.  Neurologic: Alert, moving all four extremities  Skin: No lesions       Basic Metabolic Panel: Recent Labs  Lab 01/31/21 1109 02/01/21 0456 02/02/21 0452 02/03/21 0504  NA 138 140 139 138  K 3.7 3.8 3.8 3.3*  CL 94* 99 98 98  CO2 25 25 24 27   GLUCOSE 117* 113* 112* 108*  BUN 91* 99* 113* 105*  CREATININE 3.43* 3.41* 3.67* 3.40*  CALCIUM 9.3 8.9 8.7* 8.6*  MG  --  3.2*  --   --     Liver Function Tests: Recent  Labs  Lab 01/31/21 1109  AST 92*  ALT 77*  ALKPHOS 137*  BILITOT 3.0*  PROT 6.9  ALBUMIN 3.7   No results for input(s): LIPASE, AMYLASE in the last 168 hours. No results for input(s): AMMONIA in the last 168 hours.  CBC: Recent Labs  Lab 01/31/21 1109 02/03/21 0504  WBC 7.7 13.3*  NEUTROABS 5.7  --   HGB 14.6 12.9*  HCT 42.2 38.6*  MCV 91.5 91.5  PLT 210 141*    Cardiac Enzymes: No results for input(s): CKTOTAL, CKMB, CKMBINDEX, TROPONINI in the last 168 hours.  BNP: Invalid input(s): POCBNP  CBG: Recent Labs  Lab 02/01/21 1230 02/01/21 1638  GLUCAP 112* 149*    Microbiology: Results for orders placed or performed during the hospital encounter of 01/31/21  Resp Panel by RT-PCR (Flu A&B, Covid) Nasopharyngeal Swab     Status: None   Collection Time: 01/31/21 11:49 AM   Specimen: Nasopharyngeal Swab; Nasopharyngeal(NP) swabs in vial transport medium  Result Value Ref Range Status   SARS Coronavirus 2 by RT PCR NEGATIVE NEGATIVE Final    Comment: (NOTE) SARS-CoV-2 target nucleic acids are NOT DETECTED.  The SARS-CoV-2 RNA is generally detectable in upper respiratory specimens during the acute phase of infection. The lowest concentration of SARS-CoV-2 viral copies this assay can detect is 138 copies/mL. A negative result does not  preclude SARS-Cov-2 infection and should not be used as the sole basis for treatment or other patient management decisions. A negative result may occur with  improper specimen collection/handling, submission of specimen other than nasopharyngeal swab, presence of viral mutation(s) within the areas targeted by this assay, and inadequate number of viral copies(<138 copies/mL). A negative result must be combined with clinical observations, patient history, and epidemiological information. The expected result is Negative.  Fact Sheet for Patients:  EntrepreneurPulse.com.au  Fact Sheet for Healthcare Providers:   IncredibleEmployment.be  This test is no t yet approved or cleared by the [place] FDA and  has been authorized for detection and/or diagnosis of SARS-CoV-2 by FDA under an Emergency Use Authorization (EUA). This EUA will remain  in effect (meaning this test can be used) for the duration of the COVID-19 declaration under Section 564(b)(1) of the Act, 21 U.S.C.section 360bbb-3(b)(1), unless the authorization is terminated  or revoked sooner.       Influenza A by PCR NEGATIVE NEGATIVE Final   Influenza B by PCR NEGATIVE NEGATIVE Final    Comment: (NOTE) The Xpert Xpress SARS-CoV-2/FLU/RSV plus assay is intended as an aid in the diagnosis of influenza from Nasopharyngeal swab specimens and should not be used as a sole basis for treatment. Nasal washings and aspirates are unacceptable for Xpert Xpress SARS-CoV-2/FLU/RSV testing.  Fact Sheet for Patients: EntrepreneurPulse.com.au  Fact Sheet for Healthcare Providers: IncredibleEmployment.be  This test is not yet approved or cleared by the [place] FDA and has been authorized for detection and/or diagnosis of SARS-CoV-2 by FDA under an Emergency Use Authorization (EUA). This EUA will remain in effect (meaning this test can be used) for the duration of the COVID-19 declaration under Section 564(b)(1) of the Act, 21 U.S.C. section 360bbb-3(b)(1), unless the authorization is terminated or revoked.  Performed at [facility], [address] [place], [place] [address]     Coagulation Studies: No results for input(s): LABPROT, INR in the last 72 hours.  Urinalysis: No results for input(s): COLORURINE, LABSPEC, PHURINE, GLUCOSEU, HGBUR, BILIRUBINUR, KETONESUR, PROTEINUR, UROBILINOGEN, NITRITE, LEUKOCYTESUR in the last 72 hours.  Invalid input(s): APPERANCEUR    Imaging: DG Chest Port 1 View  Result Date: 02/02/2021 CLINICAL DATA:  Shortness of breath  EXAM: PORTABLE CHEST 1 VIEW COMPARISON:  Jan 31, 2021 FINDINGS: There is consolidation in a portion of the left base. There is a partially loculated pleural effusion on the left. There is a smaller partially loculated pleural effusion on the right. There is cardiomegaly with pulmonary vascularity within normal limits. Patient is status post coronary artery bypass grafting and aortic valve replacement. No adenopathy. There is aortic atherosclerosis. No bone lesions. IMPRESSION: Airspace opacity concerning for pneumonia with atelectasis left lower lobe. Loculated pleural effusion on each side, slightly larger on the left the right. Stable cardiomegaly with postoperative changes. Aortic Atherosclerosis (ICD10-I70.0). Electronically Signed   By: Lowella Grip III M.D.   On: 02/02/2021 08:15     Medications:   . sodium chloride 250 mL (02/03/21 0825)  . furosemide (LASIX) 200 mg in dextrose 5% 100 mL (2mg /mL) infusion 4 mg/hr (02/02/21 1946)   . amiodarone  200 mg Oral BID  . aspirin EC  81 mg Oral BID  . fluticasone  1 spray Each Nare Daily  . heparin  5,000 Units Subcutaneous Q8H  . losartan  25 mg Oral Daily  . mirtazapine  15 mg Oral QHS  . sodium bicarbonate  1,300 mg Oral BID  . sodium  chloride flush  3 mL Intravenous Q12H   sodium chloride, acetaminophen, albuterol, ALPRAZolam, melatonin, menthol-cetylpyridinium, ondansetron (ZOFRAN) IV, sodium chloride flush  Assessment/ Plan:  Mr. JADRIEL SAXER is a 87 y.o.  male with past medical history of CAD with CABG, hypertension, hyperlipidemia, aortic valve replacement, heart failure with reduced EF, and CKD. He presents to the ED with shortness of breath that has progressed for a while.  # Acute Kidney Injury on chronic kidney disease stage 4 with baseline creatinine 2.2 and GFR of 28 on 01/04/21. . # acute exacerbation of Chronic systolic CHF,  With Volume overload and acute pulmonary edema.  Left ventricular ejection fraction, by  estimation, is <20%.  Acute kidney injury complicated by cardiorenal syndrome Acute kidney injury secondary to fluid overload No IV contrast exposure Dialysis not indicated at this time Creatinine improved  Continue Lasix gtt until tomorrow consider transitioning to po diuretics tomorrow Will monitor renal function  Lab Results  Component Value Date   CREATININE 3.40 (H) 02/03/2021   CREATININE 3.67 (H) 02/02/2021   CREATININE 3.41 (H) 02/01/2021    Intake/Output Summary (Last 24 hours) at 02/03/2021 1430 Last data filed at 02/03/2021 1409 Gross per 24 hour  Intake 1357.99 ml  Output 2225 ml  Net -867.01 ml   # Anemia of chronic kidney disease Lab Results  Component Value Date   HGB 12.9 (L) 02/03/2021  Hgb at goal Will monitor  # Hypertension managed with diuretics Currently soft Receiving Losartan Lasix gtt  Can consider Midodrine for BP support Amiodarone transitioned to po BID   LOS: 3 Shantelle Breeze 5/20/20222:30 PM   Patient was seen and evaluated with Colon Flattery, NP.  Plan of care was discussed with patient as well as NP.  I agree with the note as documented.

## 2021-02-03 NOTE — Progress Notes (Signed)
Patient Name: [name] Date of Encounter: 02/03/2021  Hospital Problem List     Active Problems:   CHF exacerbation [facility])   CHF (congestive heart failure), NYHA class IV, acute on chronic, systolic (HCC)   Unstageable pressure ulcer of sacral region [facility])    Patient Profile      87 y.o.malewith history ofchronic systolic heart failure, chronic kidney disease stage IV, coronary artery disease status post coronary artery bypass grafting as well as tissue prosthesis aortic valve replacement, hypertension who presented to emergency room with increasing shortness of breath and was noted to have sinus tachycardia. Troponin was  was mildly elevated. EKG showed atrial fibrillation with variable ventricular response with intermittent bundle branch block. Troponin was drawn which showed a value of 149. Somewhat elevated pro troponins 4 months ago. BNP is greater than 4500. Was 3448 4 months ago. Acute on chronic renal insufficiency with a creatinine of 3.43 BUN of 91 increased from 2.314 months ago. Baseline appears to be around 2. As an outpatient he is treated with enteric-coated aspirin, pravastatin 20 mg daily, torsemide 40 mg daily, and states he has been compliant with his medications. Chest x-ray revealed pulmonary vascular congestion. Is of note he had a thoracentesis done 4 days ago He had 400 cc of pleural fluid removed from the right lung space.  Subjective   Still somewhat short of breath today.  Has diuresed 757 cc in the past 24 hours and 570 cc since admission.  Inpatient Medications    . amiodarone  200 mg Oral BID  . aspirin EC  81 mg Oral BID  . fluticasone  1 spray Each Nare Daily  . heparin  5,000 Units Subcutaneous Q8H  . losartan  25 mg Oral Daily  . mirtazapine  15 mg Oral QHS  . sodium bicarbonate  1,300 mg Oral BID  . sodium chloride flush  3 mL Intravenous Q12H    Vital Signs    Vitals:   02/02/21 1940 02/03/21 0020 02/03/21 0350 02/03/21  0822  BP: 93/65 102/77 113/63 117/83  Pulse: 69 65 67 68  Resp: 18 19 18 18   Temp: 97.6 F (36.4 C) 98.7 F (37.1 C)  (!) 97.4 F (36.3 C)  TempSrc: Oral   Oral  SpO2: 99% 99% 100% 100%  Weight:      Height:        Intake/Output Summary (Last 24 hours) at 02/03/2021 0843 Last data filed at 02/03/2021 0842 Gross per 24 hour  Intake 1477.99 ml  Output 2225 ml  Net -747.01 ml   Filed Weights   01/31/21 1104 02/01/21 0721 02/02/21 0821  Weight: 72.6 kg 70.9 kg 70.2 kg    Physical Exam    GEN: Chronically ill-appearing HEENT: normal.  Neck: Supple, no JVD, carotid bruits, or masses. Cardiac: R irregular Respiratory:  Respirations regular and unlabored, no crackles. GI: Soft, nontender, nondistended, BS + x 4. MS: no deformity or atrophy. Skin: warm and dry, no rash. Neuro:  Strength and sensation are intact. Psych: Normal affect.  Labs    CBC Recent Labs    01/31/21 1109 02/03/21 0504  WBC 7.7 13.3*  NEUTROABS 5.7  --   HGB 14.6 12.9*  HCT 42.2 38.6*  MCV 91.5 91.5  PLT 210 976*   Basic Metabolic Panel Recent Labs    02/01/21 0456 02/02/21 0452 02/03/21 0504  NA 140 139 138  K 3.8 3.8 3.3*  CL 99 98 98  CO2 25 24 27  GLUCOSE 113* 112* 108*  BUN 99* 113* 105*  CREATININE 3.41* 3.67* 3.40*  CALCIUM 8.9 8.7* 8.6*  MG 3.2*  --   --    Liver Function Tests Recent Labs    01/31/21 1109  AST 92*  ALT 77*  ALKPHOS 137*  BILITOT 3.0*  PROT 6.9  ALBUMIN 3.7   No results for input(s): LIPASE, AMYLASE in the last 72 hours. Cardiac Enzymes No results for input(s): CKTOTAL, CKMB, CKMBINDEX, TROPONINI in the last 72 hours. BNP Recent Labs    01/31/21 1109  BNP >4,500.0*   D-Dimer No results for input(s): DDIMER in the last 72 hours. Hemoglobin A1C No results for input(s): HGBA1C in the last 72 hours. Fasting Lipid Panel No results for input(s): CHOL, HDL, LDLCALC, TRIG, CHOLHDL, LDLDIRECT in the last 72 hours. Thyroid Function Tests Recent  Labs    01/31/21 1109  TSH 7.914*    Telemetry    Normal sinus rhythm  ECG      Radiology    DG Chest 1 View  Result Date: 01/27/2021 CLINICAL DATA:  87 year old male with history of CHF status post left thoracentesis. EXAM: CHEST  1 VIEW COMPARISON:  01/25/2021 FINDINGS: Stable cardiomediastinal silhouette with unchanged moderate cardiomegaly. Persistent but decreased blunting of the right costophrenic angle. Persistent passive in cicatricial atelectasis in the right lower lobe. Similar appearing left costophrenic angle blunting and mild retrocardiac opacities. No evidence pneumothorax. No new focal consolidations. Atherosclerotic calcification of the aortic arch. Median sternotomy wires remain in place and appear intact in addition to postsurgical changes after coronary artery bypass and aortic valve replacement. No acute osseous abnormality. IMPRESSION: Trace residual right pleural effusion after right thoracentesis. No evidence of pneumothorax. Electronically Signed   By: Ruthann Cancer MD   On: 01/27/2021 09:37   DG Chest Port 1 View  Result Date: 02/02/2021 CLINICAL DATA:  Shortness of breath EXAM: PORTABLE CHEST 1 VIEW COMPARISON:  Jan 31, 2021 FINDINGS: There is consolidation in a portion of the left base. There is a partially loculated pleural effusion on the left. There is a smaller partially loculated pleural effusion on the right. There is cardiomegaly with pulmonary vascularity within normal limits. Patient is status post coronary artery bypass grafting and aortic valve replacement. No adenopathy. There is aortic atherosclerosis. No bone lesions. IMPRESSION: Airspace opacity concerning for pneumonia with atelectasis left lower lobe. Loculated pleural effusion on each side, slightly larger on the left the right. Stable cardiomegaly with postoperative changes. Aortic Atherosclerosis (ICD10-I70.0). Electronically Signed   By: Lowella Grip III M.D.   On: 02/02/2021 08:15   DG  Chest Port 1 View  Result Date: 01/31/2021 CLINICAL DATA:  Shortness of breath. EXAM: PORTABLE CHEST 1 VIEW COMPARISON:  Chest x-ray dated Jan 27, 2021. FINDINGS: Stable cardiomegaly status post CABG and AVR. Unchanged pulmonary vascular congestion and interstitial thickening. Unchanged small bilateral pleural effusions and bibasilar atelectasis. No pneumothorax. No acute osseous abnormality. IMPRESSION: 1. Unchanged congestive heart failure. Electronically Signed   By: Titus Dubin M.D.   On: 01/31/2021 11:47   DG Chest Port 1 View  Result Date: 01/25/2021 CLINICAL DATA:  CHF, status post left thoracentesis EXAM: PORTABLE CHEST 1 VIEW COMPARISON:  09/09/2020 FINDINGS: Improvement in the left effusion following thoracentesis. No pneumothorax. Small right effusion noted. Heart is enlarged with vascular congestion and basilar atelectasis versus scarring. Aorta atherosclerotic. Remote coronary bypass changes and cardiac valve replacement. Degenerative changes of the spine. Bones are osteopenic. IMPRESSION: No pneumothorax following left thoracentesis. Electronically  Signed   By: Jerilynn Mages.  Shick M.D.   On: 01/25/2021 14:06   ECHOCARDIOGRAM COMPLETE  Result Date: 02/01/2021    ECHOCARDIOGRAM REPORT   Patient Name:   [name] Date of Exam: 01/31/2021 Medical Rec #:  [id number]       Height:       71.0 in Accession #:    [id number]      Weight:       160.0 lb Date of Birth:  07/10/1931       BSA:          1.918 m Patient Age:    82 years        BP:           122/101 mmHg Patient Gender: M               HR:           106 bpm. Exam Location:  [facility] Procedure: 2D Echo, Cardiac Doppler and Color Doppler Indications:     R06.03 Acute Respiratory distress  History:         Patient has no prior history of Echocardiogram examinations.                  Aortic Valve replacement; Risk Factors:Dyslipidemia, Former                  Smoker and Hypertension. Coronary artery disease. Chronic                  kidney disease.   Sonographer:     Wilford Sports Rodgers-Jones Referring Phys:  [id number] [place] Diagnosing Phys: Bartholome Bill MD IMPRESSIONS  1. Left ventricular ejection fraction, by estimation, is <20%. The left ventricle has severely decreased function. The left ventricle has no regional wall motion abnormalities. The left ventricular internal cavity size was mildly dilated. Left ventricular diastolic parameters were normal.  2. Right ventricular systolic function is normal. The right ventricular size is mildly enlarged.  3. Left atrial size was mildly dilated.  4. Right atrial size was mildly dilated.  5. The mitral valve is grossly normal. Mild to moderate mitral valve regurgitation.  6. Tricuspid valve regurgitation is moderate.  7. The aortic valve is grossly normal. Aortic valve regurgitation is trivial. Mild aortic valve stenosis.  8. Aortic dilatation noted. FINDINGS  Left Ventricle: Left ventricular ejection fraction, by estimation, is <20%. The left ventricle has severely decreased function. The left ventricle has no regional wall motion abnormalities. The left ventricular internal cavity size was mildly dilated. There is no left ventricular hypertrophy. Left ventricular diastolic parameters were normal. Right Ventricle: The right ventricular size is mildly enlarged. Right vetricular wall thickness was not well visualized. Right ventricular systolic function is normal. Left Atrium: Left atrial size was mildly dilated. Right Atrium: Right atrial size was mildly dilated. Pericardium: There is no evidence of pericardial effusion. Mitral Valve: The mitral valve is grossly normal. Mild to moderate mitral valve regurgitation. Tricuspid Valve: The tricuspid valve is not well visualized. Tricuspid valve regurgitation is moderate. Aortic Valve: The aortic valve is grossly normal. Aortic valve regurgitation is trivial. Mild aortic stenosis is present. Aortic valve mean gradient measures 8.2 mmHg. Aortic valve peak gradient  measures 12.9 mmHg. Aortic valve area, by VTI measures 0.77  cm. Pulmonic Valve: The pulmonic valve was not well visualized. Pulmonic valve regurgitation is mild. Aorta: Aortic dilatation noted. IAS/Shunts: The atrial septum is grossly normal.  LEFT VENTRICLE PLAX 2D LVIDd:  5.88 cm LVIDs:         5.57 cm LV PW:         0.79 cm LV IVS:        0.85 cm LVOT diam:     2.10 cm LV SV:         20 LV SV Index:   10 LVOT Area:     3.46 cm  RIGHT VENTRICLE            IVC RV Basal diam:  5.06 cm    IVC diam: 2.42 cm RV S prime:     7.62 cm/s TAPSE (M-mode): 0.6 cm LEFT ATRIUM             Index       RIGHT ATRIUM           Index LA diam:        4.80 cm 2.50 cm/m  RA Area:     27.90 cm LA Vol (A2C):   98.4 ml 51.31 ml/m RA Volume:   108.00 ml 56.31 ml/m LA Vol (A4C):   82.8 ml 43.17 ml/m LA Biplane Vol: 93.1 ml 48.55 ml/m  AORTIC VALVE AV Area (Vmax):    0.73 cm AV Area (Vmean):   0.72 cm AV Area (VTI):     0.77 cm AV Vmax:           179.25 cm/s AV Vmean:          133.750 cm/s AV VTI:            0.256 m AV Peak Grad:      12.9 mmHg AV Mean Grad:      8.2 mmHg LVOT Vmax:         37.93 cm/s LVOT Vmean:        27.833 cm/s LVOT VTI:          0.057 m LVOT/AV VTI ratio: 0.22  AORTA Ao Root diam: 4.10 cm MV E velocity: 65.00 cm/s  TRICUSPID VALVE MV A velocity: 35.90 cm/s  TR Peak grad:   30.7 mmHg MV E/A ratio:  1.81        TR Vmax:        277.00 cm/s                             SHUNTS                            Systemic VTI:  0.06 m                            Systemic Diam: 2.10 cm Bartholome Bill MD Electronically signed by Bartholome Bill MD Signature Date/Time: 02/01/2021/6:58:29 AM    Final    US THORACENTESIS ASP PLEURAL SPACE W/IMG GUIDE  Result Date: 01/27/2021 INDICATION: Patient with a history of heart failure and recurrent bilateral pleural effusions. Interventional radiology asked to perform a therapeutic and diagnostic thoracentesis. EXAM: ULTRASOUND GUIDED THORACENTESIS MEDICATIONS: % lidocaine 10 mL  COMPLICATIONS: None immediate. PROCEDURE: An ultrasound guided thoracentesis was thoroughly discussed with the patient and questions answered. The benefits, risks, alternatives and complications were also discussed. The patient understands and wishes to proceed with the procedure. Written consent was obtained. Ultrasound was performed to localize and mark an adequate pocket of fluid in the right chest. The area was then prepped and draped in the  normal sterile fashion. 1% Lidocaine was used for local anesthesia. Under ultrasound guidance a 6 Fr Safe-T-Centesis catheter was introduced. Thoracentesis was performed. The catheter was removed and a dressing applied. FINDINGS: A total of approximately 400 mL of amber-colored fluid was removed. Samples were sent to the laboratory as requested by the clinical team. IMPRESSION: Successful ultrasound guided right thoracentesis yielding 400 mL of pleural fluid. Read by: Soyla Dryer, NP Electronically Signed   By: Ruthann Cancer MD   On: 01/27/2021 09:41   US THORACENTESIS ASP PLEURAL SPACE W/IMG GUIDE  Result Date: 01/25/2021 INDICATION: CHF, pleural effusions EXAM: ULTRASOUND GUIDED LEFT THORACENTESIS MEDICATIONS: 1% lidocaine local COMPLICATIONS: None immediate. PROCEDURE: An ultrasound guided thoracentesis was thoroughly discussed with the patient and questions answered. The benefits, risks, alternatives and complications were also discussed. The patient understands and wishes to proceed with the procedure. Written consent was obtained. Ultrasound was performed to localize and mark an adequate pocket of fluid in the left chest. The area was then prepped and draped in the normal sterile fashion. 1% Lidocaine was used for local anesthesia. Under ultrasound guidance a 6 Fr Safe-T-Centesis catheter was introduced. Thoracentesis was performed. The catheter was removed and a dressing applied. FINDINGS: A total of approximately 500 cc of blood tinged pleural fluid was  removed. Samples were sent to the laboratory as requested by the clinical team. IMPRESSION: Successful ultrasound guided left thoracentesis yielding 500 cc of pleural fluid. Electronically Signed   By: Jerilynn Mages.  Shick M.D.   On: 01/25/2021 14:04    Assessment & Plan      87 y.o.malewith history ofchronic systolic heart failure, chronic kidney disease stage IV, coronary artery disease status post coronary artery bypass grafting as well as tissue prosthesis aortic valve replacement, hypertension who presented to emergency room with increasing shortness of breath and was noted to have sinus tachycardia. Troponin was drawn and was mildly elevated. EKG showed atrial fibrillation with variable ventricular response with intermittent bundle branch block. Troponin was drawn which showed a value of 149. Somewhat elevated pro troponins 4 months ago. BNP is greater than 4500. Was 3448 4 months ago. Acute on chronic renal insufficiency with a creatinine of 3.43 BUN of 91 increased from 2.314 months ago. Baseline appears to be around 2. As an outpatient he is treated with enteric-coated aspirin, pravastatin 20 mg daily, torsemide 40 mg daily, and states he has been compliant with his medications. Chest x-ray revealed pulmonary vascular congestion.   1. Arrhythmia-has predominantly converted to sinus rhythm. Currently on IV amiodarone drip.    Will convert to p.o. amiodarone at 200 mg twice daily and discontinue the IV amiodarone.  2.Pleural effusions-continue to follow.  3 CHF-has not tolerated IV Lasix due to blood pressure and worsening renal failure.  EF is less than 20%.    Lasix drip started yesterday.  Creatinine improved somewhat.  Mild diuresis.  Still short of breath.  4.  Renal insufficiency-creatinine improved slightly to 3.41.  Baseline appears to be around 2.    Appreciate nephrology assistance.  Signed, Javier Docker Fred Hammes MD 02/03/2021, 8:43 AM  Pager: [phone]   Dr. Clayborn Bigness  will be following from a cardiac standpoint this weekend.

## 2021-02-03 NOTE — Progress Notes (Signed)
PROGRESS NOTE    [name]  QMV:[id number] DOB: 04/29/1931 DOA: 01/31/2021 PCP: Baxter Hire, MD   Brief Narrative: 87 y.o. male with known history of MGUS, chronic kidney disease stage IIIb, coronary disease status post CABG in the past, HFrEF, status post aortic valve replacement, hypertension and hyperlipidemia.  Patient has had recurrent pleural effusion and is status post recent thoracentesis a few days ago.  He presents to the emergency room on account of progressive worsening dyspnea.  He also admitted to worsening lower extremity edema.  He admits compliance with his medication including diuretics with no significant improvement.  He has developed significant dyspnea with minimal exertion.  Seen by cardiology.  Remains symptomatic.  EF 15 to 20% on repeat echocardiogram.  BNP markedly elevated.  Minimal response to diuretics.  Case discussed with cardiology and nephrology.  Possible plan for Lasix GTT.  Lasix GTT initiated 5/19.  Patient endorse some symptomatic improvement.  Has converted to sinus rhythm.  Taken off amiodarone gtt. and converted to amiodarone 200 mg p.o. twice daily     Assessment & Plan:   Active Problems:   CHF exacerbation (HCC)   CHF (congestive heart failure), NYHA class IV, acute on chronic, systolic (HCC)   Unstageable pressure ulcer of sacral region [facility])  Acute hypoxic respiratory failure Acute decompensated systolic congestive heart failure Cardiology following Difficulty optimizing volume status  EF 15 to 20% Start Lasix GTT on 5/19.  Appears to be improving.  Less short of breath Plan: Continue Lasix GTT Wean oxygen as tolerated Follow kidney function and electrolytes  Tachyarrhythmia Suspected atrial fibrillation with aberrancy Cardiology following Started on amiodarone, now switched to p.o. Monitor electrolytes  Acute kidney injury on chronic kidney disease stage IIIb Improving over interval.  Cardiorenal syndrome versus  progression of underlying CKD.  Currently on Lasix gtt.  Monitor electrolytes and kidney function closely.  Bilateral pleural effusion S/p thoracentesis Cautious use of diuretics  Hypertension Diuretics as above  Anxiety PTA Xanax   DVT prophylaxis: SQ heparin Code Status: DNR Family Communication: None today.  Offered to call but patient declined Disposition Plan: Status is: Inpatient  Remains inpatient appropriate because:Inpatient level of care appropriate due to severity of illness   Dispo: The patient is from: Home              Anticipated d/c is to: Unknown at this time.  SNF versus home with home health              Patient currently is not medically stable to d/c.   Difficult to place patient No  Advanced heart failure with fluid overload.  Tachyarrhythmia improved.  On Lasix gtt.  Disposition plan pending.     Level of care: Progressive Cardiac  Consultants:   Cardiology  Nephrology  Procedures:   None  Antimicrobials:   None   Subjective: Patient seen and examined.  Improved mood this morning.  Shortness of breath starting to improve.  Objective: Vitals:   02/03/21 0350 02/03/21 0822 02/03/21 0938 02/03/21 1204  BP: 113/63 117/83  98/67  Pulse: 67 68  75  Resp: 18 18  18   Temp:  (!) 97.4 F (36.3 C)  (!) 97.4 F (36.3 C)  TempSrc:  Oral  Oral  SpO2: 100% 100%  93%  Weight:   68.1 kg   Height:        Intake/Output Summary (Last 24 hours) at 02/03/2021 1352 Last data filed at 02/03/2021 0900 Gross per 24 hour  Intake 1237.99 ml  Output 2225 ml  Net -987.01 ml   Filed Weights   02/01/21 0721 02/02/21 0821 02/03/21 0938  Weight: 70.9 kg 70.2 kg 68.1 kg    Examination:  General exam: Appears calm and comfortable  Respiratory system: Bibasilar crackles.  Normal work breathing.  2 L cardiovascular system: Regular rate and rhythm, no murmurs Gastrointestinal system: Abdomen is nondistended, soft and nontender. No organomegaly or masses  felt. Normal bowel sounds heard. Central nervous system: Alert and oriented. No focal neurological deficits. Extremities: Symmetric 5 x 5 power. Skin: No rashes, lesions or ulcers Psychiatry: Judgement and insight appear normal. Mood & affect appropriate.     Data Reviewed: I have personally reviewed following labs and imaging studies  CBC: Recent Labs  Lab 01/31/21 1109 02/03/21 0504  WBC 7.7 13.3*  NEUTROABS 5.7  --   HGB 14.6 12.9*  HCT 42.2 38.6*  MCV 91.5 91.5  PLT 210 086*   Basic Metabolic Panel: Recent Labs  Lab 01/31/21 1109 02/01/21 0456 02/02/21 0452 02/03/21 0504  NA 138 140 139 138  K 3.7 3.8 3.8 3.3*  CL 94* 99 98 98  CO2 25 25 24 27   GLUCOSE 117* 113* 112* 108*  BUN 91* 99* 113* 105*  CREATININE 3.43* 3.41* 3.67* 3.40*  CALCIUM 9.3 8.9 8.7* 8.6*  MG  --  3.2*  --   --    GFR: Estimated Creatinine Clearance: 14.7 mL/min (A) (by C-G formula based on SCr of 3.4 mg/dL (H)). Liver Function Tests: Recent Labs  Lab 01/31/21 1109  AST 92*  ALT 77*  ALKPHOS 137*  BILITOT 3.0*  PROT 6.9  ALBUMIN 3.7   No results for input(s): LIPASE, AMYLASE in the last 168 hours. No results for input(s): AMMONIA in the last 168 hours. Coagulation Profile: No results for input(s): INR, PROTIME in the last 168 hours. Cardiac Enzymes: No results for input(s): CKTOTAL, CKMB, CKMBINDEX, TROPONINI in the last 168 hours. BNP (last 3 results) No results for input(s): PROBNP in the last 8760 hours. HbA1C: No results for input(s): HGBA1C in the last 72 hours. CBG: Recent Labs  Lab 02/01/21 1230 02/01/21 1638  GLUCAP 112* 149*   Lipid Profile: No results for input(s): CHOL, HDL, LDLCALC, TRIG, CHOLHDL, LDLDIRECT in the last 72 hours. Thyroid Function Tests: No results for input(s): TSH, T4TOTAL, FREET4, T3FREE, THYROIDAB in the last 72 hours. Anemia Panel: No results for input(s): VITAMINB12, FOLATE, FERRITIN, TIBC, IRON, RETICCTPCT in the last 72 hours. Sepsis  Labs: Recent Labs  Lab 02/02/21 0452  PROCALCITON 0.19    Recent Results (from the past 240 hour(s))  Fungus Culture With Stain     Status: None (Preliminary result)   Collection Time: 01/25/21  1:24 PM   Specimen: PATH Cytology Pleural fluid  Result Value Ref Range Status   Fungus Stain Final report  Final    Comment: (NOTE) Performed At: [facility] [place], [place] [id number] Rush Farmer MD [id number]    Fungus (Mycology) Culture PENDING  Incomplete   Fungal Source PLEURAL  Final    Comment: Performed at [facility], [place], [place] [address]  Acid Fast Smear (AFB)     Status: None   Collection Time: 01/25/21  1:24 PM   Specimen: PATH Cytology Pleural fluid  Result Value Ref Range Status   AFB Specimen Processing Concentration  Final   Acid Fast Smear Negative  Final    Comment: (NOTE) Performed At: [facility] [place], [place] [id number] Rush Farmer MD [id number]    Source (AFB) PLEURAL  Final    Comment: Performed at [facility], [address] [place], [place] [address]  Body fluid culture w Gram Stain     Status: None   Collection Time: 01/25/21  1:24 PM   Specimen: PATH Cytology Pleural fluid  Result Value Ref Range Status   Specimen Description   Final    PLEURAL Performed at [facility], [address], [place], [place] [address]    Special Requests   Final    NONE Performed at [facility], [address] [place], [place] [address]    Gram Stain   Final    FEW WBC PRESENT, PREDOMINANTLY MONONUCLEAR NO ORGANISMS SEEN    Culture   Final    NO GROWTH Performed at [facility] [address] [place], [place] [address]    Report Status 01/29/2021 FINAL  Final  Fungus Culture Result     Status: None   Collection Time: 01/25/21  1:24 PM  Result Value Ref Range Status   Result 1 Comment  Final    Comment: (NOTE) KOH/Calcofluor  preparation:  no fungus observed. Performed At: [facility] [place], [place] [id number] Rush Farmer MD [id number]   Fungus Culture With Stain     Status: None (Preliminary result)   Collection Time: 01/27/21  8:50 AM   Specimen: PATH Cytology Pleural fluid  Result Value Ref Range Status   Fungus Stain Final report  Final    Comment: (NOTE) Performed At: [facility] [place], [place] [id number] Rush Farmer MD [id number]    Fungus (Mycology) Culture PENDING  Incomplete   Fungal Source PLEURAL  Final    Comment: Performed at [facility], [address] [place], [place] [address]  Acid Fast Smear (AFB)     Status: None   Collection Time: 01/27/21  8:50 AM   Specimen: PATH Cytology Pleural fluid  Result Value Ref Range Status   AFB Specimen Processing Concentration  Final   Acid Fast Smear Negative  Final    Comment: (NOTE) Performed At: [facility] [place], [place] [id number] Rush Farmer MD [id number]    Source (AFB) PLEURAL  Final    Comment: Performed at [facility], [address] [place], [place] [address]  Body fluid culture w Gram Stain     Status: None   Collection Time: 01/27/21  8:50 AM   Specimen: PATH Cytology Pleural fluid  Result Value Ref Range Status   Specimen Description   Final    PLEURAL Performed at [facility], [address], [place], [place] [address]    Special Requests   Final    NONE Performed at [facility], [address] [place], [place] [address]    Gram Stain   Final    FEW WBC PRESENT, PREDOMINANTLY MONONUCLEAR NO ORGANISMS SEEN    Culture   Final    NO GROWTH 3 DAYS Performed at [facility], [place] [address], [place], [place] [address]    Report Status 01/30/2021 FINAL  Final  Fungus Culture Result     Status: None   Collection Time: 01/27/21  8:50 AM  Result Value Ref Range Status   Result 1  Comment  Final    Comment: (NOTE) KOH/Calcofluor preparation:  no fungus observed. Performed At: [facility] [place] [address]  [place] [id number] Rush Farmer MD [id number]   Resp Panel by RT-PCR (Flu A&B, Covid) Nasopharyngeal Swab     Status: None   Collection Time: 01/31/21 11:49 AM   Specimen: Nasopharyngeal Swab; Nasopharyngeal(NP) swabs in vial transport medium  Result Value Ref Range Status   SARS Coronavirus 2 by RT PCR NEGATIVE NEGATIVE Final    Comment: (NOTE) SARS-CoV-2 target nucleic acids are NOT DETECTED.  The SARS-CoV-2 RNA is generally detectable in upper respiratory specimens during the acute phase of infection. The lowest concentration of SARS-CoV-2 viral copies this assay can detect is 138 copies/mL. A negative result does not preclude SARS-Cov-2 infection and should not be used as the sole basis for treatment or other patient management decisions. A negative result may occur with  improper specimen collection/handling, submission of specimen other than nasopharyngeal swab, presence of viral mutation(s) within the areas targeted by this assay, and inadequate number of viral copies(<138 copies/mL). A negative result must be combined with clinical observations, patient history, and epidemiological information. The expected result is Negative.  Fact Sheet for Patients:  EntrepreneurPulse.com.au  Fact Sheet for Healthcare Providers:  IncredibleEmployment.be  This test is no t yet approved or cleared by the [place] FDA and  has been authorized for detection and/or diagnosis of SARS-CoV-2 by FDA under an Emergency Use Authorization (EUA). This EUA will remain  in effect (meaning this test can be used) for the duration of the COVID-19 declaration under Section 564(b)(1) of the Act, 21 U.S.C.section 360bbb-3(b)(1), unless the authorization is terminated  or revoked sooner.       Influenza A by PCR  NEGATIVE NEGATIVE Final   Influenza B by PCR NEGATIVE NEGATIVE Final    Comment: (NOTE) The Xpert Xpress SARS-CoV-2/FLU/RSV plus assay is intended as an aid in the diagnosis of influenza from Nasopharyngeal swab specimens and should not be used as a sole basis for treatment. Nasal washings and aspirates are unacceptable for Xpert Xpress SARS-CoV-2/FLU/RSV testing.  Fact Sheet for Patients: EntrepreneurPulse.com.au  Fact Sheet for Healthcare Providers: IncredibleEmployment.be  This test is not yet approved or cleared by the [place] FDA and has been authorized for detection and/or diagnosis of SARS-CoV-2 by FDA under an Emergency Use Authorization (EUA). This EUA will remain in effect (meaning this test can be used) for the duration of the COVID-19 declaration under Section 564(b)(1) of the Act, 21 U.S.C. section 360bbb-3(b)(1), unless the authorization is terminated or revoked.  Performed at [facility], [address], [place],  [address]          Radiology Studies: [facility] Chest [place] 1 View  Result Date: 02/02/2021 CLINICAL DATA:  Shortness of breath EXAM: PORTABLE CHEST 1 VIEW COMPARISON:  Jan 31, 2021 FINDINGS: There is consolidation in a portion of the left base. There is a partially loculated pleural effusion on the left. There is a smaller partially loculated pleural effusion on the right. There is cardiomegaly with pulmonary vascularity within normal limits. Patient is status post coronary artery bypass grafting and aortic valve replacement. No adenopathy. There is aortic atherosclerosis. No bone lesions. IMPRESSION: Airspace opacity concerning for pneumonia with atelectasis left lower lobe. Loculated pleural effusion on each side, slightly larger on the left the right. Stable cardiomegaly with postoperative changes. Aortic Atherosclerosis (ICD10-I70.0). Electronically Signed   By: Lowella Grip III M.D.   On:  02/02/2021 08:15        Scheduled Meds: . amiodarone  200 mg Oral BID  . aspirin EC  81 mg Oral BID  . fluticasone  1 spray Each Nare Daily  . heparin  5,000 Units Subcutaneous Q8H  . losartan  25 mg Oral Daily  . mirtazapine  15 mg Oral QHS  . sodium bicarbonate  1,300 mg Oral BID  . sodium chloride flush  3 mL Intravenous Q12H   Continuous Infusions: . sodium chloride 250 mL (02/03/21 0825)  . furosemide (LASIX) 200 mg in dextrose 5% 100 mL (2mg /mL) infusion 4 mg/hr (02/02/21 1946)     LOS: 3 days    Time spent: 25 minutes    Sidney Ace, MD [facility] Pager [phone]  If 7PM-7AM, please contact night-coverage 02/03/2021, 1:52 PM

## 2021-02-04 DIAGNOSIS — I5023 Acute on chronic systolic (congestive) heart failure: Secondary | ICD-10-CM | POA: Diagnosis not present

## 2021-02-04 LAB — BASIC METABOLIC PANEL
Anion gap: 12 (ref 5–15)
BUN: 93 mg/dL — ABNORMAL HIGH (ref 8–23)
CO2: 28 mmol/L (ref 22–32)
Calcium: 8.2 mg/dL — ABNORMAL LOW (ref 8.9–10.3)
Chloride: 100 mmol/L (ref 98–111)
Creatinine, Ser: 3.12 mg/dL — ABNORMAL HIGH (ref 0.61–1.24)
GFR, Estimated: 19 mL/min — ABNORMAL LOW (ref >60)
Glucose, Bld: 109 mg/dL — ABNORMAL HIGH (ref 70–99)
Potassium: 3.5 mmol/L (ref 3.5–5.1)
Sodium: 140 mmol/L (ref 135–145)

## 2021-02-04 LAB — AST: AST: 52 U/L — ABNORMAL HIGH (ref 15–41)

## 2021-02-04 LAB — MAGNESIUM: Magnesium: 2.7 mg/dL — ABNORMAL HIGH (ref 1.7–2.4)

## 2021-02-04 LAB — ALT: ALT: 77 U/L — ABNORMAL HIGH (ref 0–44)

## 2021-02-04 MED ORDER — TORSEMIDE 20 MG PO TABS
40.0000 mg | ORAL_TABLET | Freq: Every day | ORAL | Status: DC
  Administered 2021-02-04 – 2021-02-08 (×5): 40 mg via ORAL
  Filled 2021-02-04 (×5): qty 2

## 2021-02-04 NOTE — Progress Notes (Signed)
[facility] Cardiology    SUBJECTIVE: Patient states he feels much better there is no significant leg edema improved shortness of breath is beginning to feel well enough to be discharged home he says he has help from his his family but still lives alone.  Feels significantly improved from initial admission   Vitals:   02/04/21 0745 02/04/21 0900 02/04/21 1155 02/04/21 1156  BP: 111/80  92/62   Pulse:    89  Resp: 19  (!) 29 19  Temp: 97.7 F (36.5 C)   97.9 F (36.6 C)  TempSrc: Axillary   Oral  SpO2:    98%  Weight:  68.8 kg    Height:         Intake/Output Summary (Last 24 hours) at 02/04/2021 1508 Last data filed at 02/04/2021 1300 Gross per 24 hour  Intake 1106.07 ml  Output 2700 ml  Net -1593.93 ml      PHYSICAL EXAM  General: Well developed, well nourished, in no acute distress HEENT:  Normocephalic and atramatic Neck:  No JVD.  Lungs: Clear bilaterally to auscultation and percussion.  Bilateral basilar rhonchi and dullness Heart: HRRR . Normal S1 and S2 without gallops or murmurs.  Abdomen: Bowel sounds are positive, abdomen soft and non-tender  Msk:  Back normal, normal gait. Normal strength and tone for age. Extremities: No clubbing, cyanosis or edema.   Neuro: Alert and oriented X 3. Psych:  Good affect, responds appropriately   LABS: Basic Metabolic Panel: Recent Labs    02/03/21 0504 02/04/21 0457  NA 138 140  K 3.3* 3.5  CL 98 100  CO2 27 28  GLUCOSE 108* 109*  BUN 105* 93*  CREATININE 3.40* 3.12*  CALCIUM 8.6* 8.2*  MG  --  2.7*   Liver Function Tests: Recent Labs    02/04/21 0457  AST 52*  ALT 77*   No results for input(s): LIPASE, AMYLASE in the last 72 hours. CBC: Recent Labs    02/03/21 0504  WBC 13.3*  HGB 12.9*  HCT 38.6*  MCV 91.5  PLT 141*   Cardiac Enzymes: No results for input(s): CKTOTAL, CKMB, CKMBINDEX, TROPONINI in the last 72 hours. BNP: Invalid input(s): POCBNP D-Dimer: No results for input(s): DDIMER in the last  72 hours. Hemoglobin A1C: No results for input(s): HGBA1C in the last 72 hours. Fasting Lipid Panel: No results for input(s): CHOL, HDL, LDLCALC, TRIG, CHOLHDL, LDLDIRECT in the last 72 hours. Thyroid Function Tests: No results for input(s): TSH, T4TOTAL, T3FREE, THYROIDAB in the last 72 hours.  Invalid input(s): FREET3 Anemia Panel: No results for input(s): VITAMINB12, FOLATE, FERRITIN, TIBC, IRON, RETICCTPCT in the last 72 hours.  No results found.   Echo severely depressed left ventricular function EF around 25%  TELEMETRY: Normal sinus rhythm rate of around 85  ASSESSMENT AND PLAN:  Active Problems:   CHF exacerbation (HCC)   CHF (congestive heart failure), NYHA class IV, acute on chronic, systolic (HCC)   Unstageable pressure ulcer of sacral region [facility]) History of coronary bypass surgery Acute on chronic renal insufficiency Prosthetic aortic valve replacement Hypertension Shortness of breath Elevated BNP Hyperlipidemia Nonsustained VT  Plan Continue current therapy for heart failure we will switch from IV Lasix to p.o. Continue amiodarone therapy for atrial fibrillation converted to p.o. Maintain therapy for heart failure patient hypotensive which limits options Agree with nephrology input for renal insufficiency Continue statin therapy for hyperlipidemia Chronic pleural effusions we will try to treat medically with diuretics   Quanisha Drewry D  Clayborn Bigness, MD 02/04/2021 3:08 PM

## 2021-02-04 NOTE — Progress Notes (Signed)
[facility]  ROUNDING NOTE   Subjective:   [name] is a 87 y.o. male with past medical history of CAD with CABG, hypertension, hyperlipidemia, aortic valve replacement, heart failure with reduced EF, and CKD stage 3B. He presents to the ED with shortness of breath that has progressed for a while.   He is a current patient of [facility], seen by Dr Holley Raring. He states he missed his appointment earlier this week.   Patient seen today resting in bed  , having breakfast.  Pt offers no new complaints    Objective:  Vital signs in last 24 hours:  Temp:  [97.4 F (36.3 C)-98.6 F (37 C)] 98.6 F (37 C) (05/21 0414) Pulse Rate:  [68-76] 74 (05/21 0414) Resp:  [18-20] 20 (05/21 0414) BP: (98-117)/(67-83) 108/69 (05/21 0414) SpO2:  [93 %-100 %] 99 % (05/21 0414) Weight:  [68.1 kg-70.6 kg] 70.6 kg (05/21 0414)  Weight change: -2.072 kg Filed Weights   02/02/21 0821 02/03/21 0938 02/04/21 0414  Weight: 70.2 kg 68.1 kg 70.6 kg    Intake/Output: I/O last 3 completed shifts: In: 1407.8 [P.O.:718; I.V.:689.8] Out: 3500 [Urine:3500]   Intake/Output this shift:  No intake/output data recorded.  Physical Exam: General: NAD, sitting in chair  Head: Normocephalic, atraumatic. Moist oral mucosal membranes  Eyes: Anicteric  Lungs:  Fine crackles, 2L [place]  Heart: Regular rate and rhythm  Abdomen:  Soft, nontender,   Extremities:  1+ peripheral edema RLE.  Neurologic: Alert, moving all four extremities  Skin: No lesions       Basic Metabolic Panel: Recent Labs  Lab 01/31/21 1109 02/01/21 0456 02/02/21 0452 02/03/21 0504 02/04/21 0457  NA 138 140 139 138 140  K 3.7 3.8 3.8 3.3* 3.5  CL 94* 99 98 98 100  CO2 25 25 24 27 28   GLUCOSE 117* 113* 112* 108* 109*  BUN 91* 99* 113* 105* 93*  CREATININE 3.43* 3.41* 3.67* 3.40* 3.12*  CALCIUM 9.3 8.9 8.7* 8.6* 8.2*  MG  --  3.2*  --   --  2.7*    Liver Function Tests: Recent Labs  Lab 01/31/21 1109 02/04/21 0457  AST  92* 52*  ALT 77* 77*  ALKPHOS 137*  --   BILITOT 3.0*  --   PROT 6.9  --   ALBUMIN 3.7  --    No results for input(s): LIPASE, AMYLASE in the last 168 hours. No results for input(s): AMMONIA in the last 168 hours.  CBC: Recent Labs  Lab 01/31/21 1109 02/03/21 0504  WBC 7.7 13.3*  NEUTROABS 5.7  --   HGB 14.6 12.9*  HCT 42.2 38.6*  MCV 91.5 91.5  PLT 210 141*    Cardiac Enzymes: No results for input(s): CKTOTAL, CKMB, CKMBINDEX, TROPONINI in the last 168 hours.  BNP: Invalid input(s): POCBNP  CBG: Recent Labs  Lab 02/01/21 1230 02/01/21 1638  GLUCAP 112* 149*    Microbiology: Results for orders placed or performed during the hospital encounter of 01/31/21  Resp Panel by RT-PCR (Flu A&B, Covid) Nasopharyngeal Swab     Status: None   Collection Time: 01/31/21 11:49 AM   Specimen: Nasopharyngeal Swab; Nasopharyngeal(NP) swabs in vial transport medium  Result Value Ref Range Status   SARS Coronavirus 2 by RT PCR NEGATIVE NEGATIVE Final    Comment: (NOTE) SARS-CoV-2 target nucleic acids are NOT DETECTED.  The SARS-CoV-2 RNA is generally detectable in upper respiratory specimens during the acute phase of infection. The lowest concentration of SARS-CoV-2 viral  copies this assay can detect is 138 copies/mL. A negative result does not preclude SARS-Cov-2 infection and should not be used as the sole basis for treatment or other patient management decisions. A negative result may occur with  improper specimen collection/handling, submission of specimen other than nasopharyngeal swab, presence of viral mutation(s) within the areas targeted by this assay, and inadequate number of viral copies(<138 copies/mL). A negative result must be combined with clinical observations, patient history, and epidemiological information. The expected result is Negative.  Fact Sheet for Patients:  EntrepreneurPulse.com.au  Fact Sheet for Healthcare Providers:   IncredibleEmployment.be  This test is no t yet approved or cleared by the [place] FDA and  has been authorized for detection and/or diagnosis of SARS-CoV-2 by FDA under an Emergency Use Authorization (EUA). This EUA will remain  in effect (meaning this test can be used) for the duration of the COVID-19 declaration under Section 564(b)(1) of the Act, 21 U.S.C.section 360bbb-3(b)(1), unless the authorization is terminated  or revoked sooner.       Influenza A by PCR NEGATIVE NEGATIVE Final   Influenza B by PCR NEGATIVE NEGATIVE Final    Comment: (NOTE) The Xpert Xpress SARS-CoV-2/FLU/RSV plus assay is intended as an aid in the diagnosis of influenza from Nasopharyngeal swab specimens and should not be used as a sole basis for treatment. Nasal washings and aspirates are unacceptable for Xpert Xpress SARS-CoV-2/FLU/RSV testing.  Fact Sheet for Patients: EntrepreneurPulse.com.au  Fact Sheet for Healthcare Providers: IncredibleEmployment.be  This test is not yet approved or cleared by the [place] FDA and has been authorized for detection and/or diagnosis of SARS-CoV-2 by FDA under an Emergency Use Authorization (EUA). This EUA will remain in effect (meaning this test can be used) for the duration of the COVID-19 declaration under Section 564(b)(1) of the Act, 21 U.S.C. section 360bbb-3(b)(1), unless the authorization is terminated or revoked.  Performed at [facility], [address] [place], [place] [address]     Coagulation Studies: No results for input(s): LABPROT, INR in the last 72 hours.  Urinalysis: No results for input(s): COLORURINE, LABSPEC, PHURINE, GLUCOSEU, HGBUR, BILIRUBINUR, KETONESUR, PROTEINUR, UROBILINOGEN, NITRITE, LEUKOCYTESUR in the last 72 hours.  Invalid input(s): APPERANCEUR    Imaging: DG Chest Port 1 View  Result Date: 02/02/2021 CLINICAL DATA:  Shortness of breath  EXAM: PORTABLE CHEST 1 VIEW COMPARISON:  Jan 31, 2021 FINDINGS: There is consolidation in a portion of the left base. There is a partially loculated pleural effusion on the left. There is a smaller partially loculated pleural effusion on the right. There is cardiomegaly with pulmonary vascularity within normal limits. Patient is status post coronary artery bypass grafting and aortic valve replacement. No adenopathy. There is aortic atherosclerosis. No bone lesions. IMPRESSION: Airspace opacity concerning for pneumonia with atelectasis left lower lobe. Loculated pleural effusion on each side, slightly larger on the left the right. Stable cardiomegaly with postoperative changes. Aortic Atherosclerosis (ICD10-I70.0). Electronically Signed   By: Lowella Grip III M.D.   On: 02/02/2021 08:15     Medications:   . sodium chloride 250 mL (02/03/21 0825)  . furosemide (LASIX) 200 mg in dextrose 5% 100 mL (2mg /mL) infusion 4 mg/hr (02/03/21 2323)   . amiodarone  200 mg Oral BID  . aspirin EC  81 mg Oral BID  . fluticasone  1 spray Each Nare Daily  . heparin  5,000 Units Subcutaneous Q8H  . losartan  25 mg Oral Daily  . mirtazapine  15 mg Oral  QHS  . sodium bicarbonate  1,300 mg Oral BID  . sodium chloride flush  3 mL Intravenous Q12H   sodium chloride, acetaminophen, albuterol, ALPRAZolam, melatonin, menthol-cetylpyridinium, ondansetron (ZOFRAN) IV, sodium chloride flush  Assessment/ Plan:  [name] is a 87 y.o.  male with past medical history of CAD with CABG, hypertension, hyperlipidemia, aortic valve replacement, heart failure with reduced EF, and CKD. He presents to the ED with shortness of breath that has progressed for a while.  #Renal Acute Kidney Injury on chronic kidney disease stage 4  with baseline creatinine 2.2 and GFR of 28 on 01/04/21. Marland Kitchen  #Acute on  Chronic systolic CHF,  Patient admitted with with Volume overload and acute pulmonary edema.  Patient 2D echo shows left  ventricular ejection fraction, by estimation, is <20%.   Acute kidney injury complicated by cardiorenal syndrome Acute kidney injury secondary to fluid overload No IV contrast exposure Dialysis not indicated at this time Creatinine is improving We will transition patient from Lasix drip to po diuretics today Will monitor renal function  Lab Results  Component Value Date   CREATININE 3.12 (H) 02/04/2021   CREATININE 3.40 (H) 02/03/2021   CREATININE 3.67 (H) 02/02/2021    Intake/Output Summary (Last 24 hours) at 02/04/2021 0754 Last data filed at 02/04/2021 0700 Gross per 24 hour  Intake 769.8 ml  Output 2650 ml  Net -1880.2 ml   # Anemia of chronic kidney disease Lab Results  Component Value Date   HGB 12.9 (L) 02/03/2021  Hgb at goal Will monitor  # Hypotension   Patient blood pressure is on lower side This is most likely secondary to IV diuresis Patient is on losartan as well. We will continue the current medication for now as we will be transitioning patient's diuretics to p.o.   Plan  Will d/c IV Lasix drip Will start on PO Torsemide 40mg  po daily, pt was on 20mg  po daily as outpt     LOS: 4 Lexiana Spindel s Iveliz Garay 5/21/20227:54 AM

## 2021-02-04 NOTE — Progress Notes (Signed)
PROGRESS NOTE    [name]  AJO:[id number] DOB: [birth year] DOA: 01/31/2021 PCP: Baxter Hire, MD   Brief Narrative: 87 y.o. male with known history of MGUS, chronic kidney disease stage IIIb, coronary disease status post CABG in the past, HFrEF, status post aortic valve replacement, hypertension and hyperlipidemia.  Patient has had recurrent pleural effusion and is status post recent thoracentesis a few days ago.  He presents to the emergency room on account of progressive worsening dyspnea.  He also admitted to worsening lower extremity edema.  He admits compliance with his medication including diuretics with no significant improvement.  He has developed significant dyspnea with minimal exertion.  Seen by cardiology.  Remains symptomatic.  EF 15 to 20% on repeat echocardiogram.  BNP markedly elevated.  Minimal response to diuretics.  Case discussed with cardiology and nephrology.  Possible plan for Lasix GTT.  Lasix GTT initiated 5/19.  Patient endorse some symptomatic improvement.  Has converted to sinus rhythm.  Taken off amiodarone gtt. and converted to amiodarone 200 mg p.o. twice daily.  Kidney function continues to improve and patient continues to feel symptomatically better.  Lasix GTT stopped on 02/04/2021 and converted to p.o. torsemide 40 mg daily (home dose 20 mg torsemide)     Assessment & Plan:   Active Problems:   CHF exacerbation (HCC)   CHF (congestive heart failure), NYHA class IV, acute on chronic, systolic (HCC)   Unstageable pressure ulcer of sacral region [facility])  Acute hypoxic respiratory failure Acute decompensated systolic congestive heart failure Cardiology following Difficulty optimizing volume status  EF 15 to 20% Start Lasix GTT on 5/19.  Appears to be improving.  Less short of breath Plan: Discontinue Lasix GTT per nephrology Start torsemide 40 mg daily, home dose 20 mg Wean oxygen as tolerated  Tachyarrhythmia Suspected atrial fibrillation  with aberrancy Cardiology following Started on amiodarone, now switched to p.o. Monitor electrolytes  Acute kidney injury on chronic kidney disease stage IIIb Improving over interval.  Cardiorenal syndrome versus progression of underlying CKD.  Currently on Lasix gtt. will be discontinued today.  Monitor electrolytes and kidney function closely.  Bilateral pleural effusion S/p thoracentesis Cautious use of diuretics  Hypertension Diuretics as above  Anxiety PTA Xanax   DVT prophylaxis: SQ heparin Code Status: DNR Family Communication: Daughter [name] [phone] on 5/21 Disposition Plan: Status is: Inpatient  Remains inpatient appropriate because:Inpatient level of care appropriate due to severity of illness   Dispo: The patient is from: Home              Anticipated d/c is to: Unknown at this time.  SNF versus home with home health              Patient currently is not medically stable to d/c.   Difficult to place patient No  Advanced heart failure with fluid overload.  Tachyarrhythmia improved.  On Lasix gtt.  Disposition plan pending.     Level of care: Progressive Cardiac  Consultants:   Cardiology  Nephrology  Procedures:   None  Antimicrobials:   None   Subjective: Patient seen and examined.  Improved mood.  Shortness of breath improving.  Objective: Vitals:   02/04/21 0745 02/04/21 0900 02/04/21 1155 02/04/21 1156  BP: 111/80  92/62   Pulse:    89  Resp: 19  (!) 29 19  Temp: 97.7 F (36.5 C)   97.9 F (36.6 C)  TempSrc: Axillary   Oral  SpO2:    98%  Weight:  68.8 kg    Height:        Intake/Output Summary (Last 24 hours) at 02/04/2021 1321 Last data filed at 02/04/2021 1100 Gross per 24 hour  Intake 1253.9 ml  Output 2900 ml  Net -1646.1 ml   Filed Weights   02/03/21 0938 02/04/21 0414 02/04/21 0900  Weight: 68.1 kg 70.6 kg 68.8 kg    Examination:  General exam: Appears calm and comfortable  Respiratory system: Bibasilar  crackles.  Normal work of breathing.  2 L  cardiovascular system: Regular rate and rhythm, no murmurs Gastrointestinal system: Abdomen is nondistended, soft and nontender. No organomegaly or masses felt. Normal bowel sounds heard. Central nervous system: Alert and oriented. No focal neurological deficits. Extremities: Symmetric 5 x 5 power. Skin: No rashes, lesions or ulcers Psychiatry: Judgement and insight appear normal. Mood & affect appropriate.     Data Reviewed: I have personally reviewed following labs and imaging studies  CBC: Recent Labs  Lab 01/31/21 1109 02/03/21 0504  WBC 7.7 13.3*  NEUTROABS 5.7  --   HGB 14.6 12.9*  HCT 42.2 38.6*  MCV 91.5 91.5  PLT 210 109*   Basic Metabolic Panel: Recent Labs  Lab 01/31/21 1109 02/01/21 0456 02/02/21 0452 02/03/21 0504 02/04/21 0457  NA 138 140 139 138 140  K 3.7 3.8 3.8 3.3* 3.5  CL 94* 99 98 98 100  CO2 25 25 24 27 28   GLUCOSE 117* 113* 112* 108* 109*  BUN 91* 99* 113* 105* 93*  CREATININE 3.43* 3.41* 3.67* 3.40* 3.12*  CALCIUM 9.3 8.9 8.7* 8.6* 8.2*  MG  --  3.2*  --   --  2.7*   GFR: Estimated Creatinine Clearance: 16.2 mL/min (A) (by C-G formula based on SCr of 3.12 mg/dL (H)). Liver Function Tests: Recent Labs  Lab 01/31/21 1109 02/04/21 0457  AST 92* 52*  ALT 77* 77*  ALKPHOS 137*  --   BILITOT 3.0*  --   PROT 6.9  --   ALBUMIN 3.7  --    No results for input(s): LIPASE, AMYLASE in the last 168 hours. No results for input(s): AMMONIA in the last 168 hours. Coagulation Profile: No results for input(s): INR, PROTIME in the last 168 hours. Cardiac Enzymes: No results for input(s): CKTOTAL, CKMB, CKMBINDEX, TROPONINI in the last 168 hours. BNP (last 3 results) No results for input(s): PROBNP in the last 8760 hours. HbA1C: No results for input(s): HGBA1C in the last 72 hours. CBG: Recent Labs  Lab 02/01/21 1230 02/01/21 1638  GLUCAP 112* 149*   Lipid Profile: No results for input(s): CHOL,  HDL, LDLCALC, TRIG, CHOLHDL, LDLDIRECT in the last 72 hours. Thyroid Function Tests: No results for input(s): TSH, T4TOTAL, FREET4, T3FREE, THYROIDAB in the last 72 hours. Anemia Panel: No results for input(s): VITAMINB12, FOLATE, FERRITIN, TIBC, IRON, RETICCTPCT in the last 72 hours. Sepsis Labs: Recent Labs  Lab 02/02/21 0452  PROCALCITON 0.19    Recent Results (from the past 240 hour(s))  Fungus Culture With Stain     Status: None (Preliminary result)   Collection Time: 01/25/21  1:24 PM   Specimen: PATH Cytology Pleural fluid  Result Value Ref Range Status   Fungus Stain Final report  Final    Comment: (NOTE) Performed At: [facility] [address], [place] [id number] Rush Farmer MD YH:[id number]    Fungus (Mycology) Culture PENDING  Incomplete   Fungal Source PLEURAL  Final    Comment: Performed at [facility], [address] [place], [place] [address]  Acid Fast Smear (AFB)     Status: None   Collection Time: 01/25/21  1:24 PM   Specimen: PATH Cytology Pleural fluid  Result Value Ref Range Status   AFB Specimen Processing Concentration  Final   Acid Fast Smear Negative  Final    Comment: (NOTE) Performed At: [facility] [place], [place] [id number] Rush Farmer MD [id number]    Source (AFB) PLEURAL  Final    Comment: Performed at [facility], [address] [place], [place] [address]  Body fluid culture w Gram Stain     Status: None   Collection Time: 01/25/21  1:24 PM   Specimen: PATH Cytology Pleural fluid  Result Value Ref Range Status   Specimen Description   Final    PLEURAL Performed at [facility], [address] [place], [place] [address]    Special Requests   Final    NONE Performed at [facility], [address] [place], [place] [address]    Gram Stain   Final    FEW WBC PRESENT, PREDOMINANTLY MONONUCLEAR NO ORGANISMS SEEN    Culture   Final    NO  GROWTH Performed at [facility], [address] [address] [place], [place] [address]    Report Status 01/29/2021 FINAL  Final  Fungus Culture Result     Status: None   Collection Time: 01/25/21  1:24 PM  Result Value Ref Range Status   Result 1 Comment  Final    Comment: (NOTE) KOH/Calcofluor preparation:  no fungus observed. Performed At: [facility] [place], [place] [id number] Rush Farmer MD [id number]   Fungus Culture With Stain     Status: None (Preliminary result)   Collection Time: 01/27/21  8:50 AM   Specimen: PATH Cytology Pleural fluid  Result Value Ref Range Status   Fungus Stain Final report  Final    Comment: (NOTE) Performed At: [facility] [place], [place] [id number] Rush Farmer MD [id number]    Fungus (Mycology) Culture PENDING  Incomplete   Fungal Source PLEURAL  Final    Comment: Performed at [facility], [address] [place], [place] [address]  Acid Fast Smear (AFB)     Status: None   Collection Time: 01/27/21  8:50 AM   Specimen: PATH Cytology Pleural fluid  Result Value Ref Range Status   AFB Specimen Processing Concentration  Final   Acid Fast Smear Negative  Final    Comment: (NOTE) Performed At: [facility], [place] [id number] Rush Farmer MD [id number]    Source (AFB) PLEURAL  Final    Comment: Performed at [facility], [address] [place], [place] [address]  Body fluid culture w Gram Stain     Status: None   Collection Time: 01/27/21  8:50 AM   Specimen: PATH Cytology Pleural fluid  Result Value Ref Range Status   Specimen Description   Final    PLEURAL Performed at [facility], [address] [place], [place] [address]    Special Requests   Final    NONE Performed at [facility], [address] [place], [place] [address]    Gram Stain   Final    FEW WBC PRESENT, PREDOMINANTLY MONONUCLEAR NO  ORGANISMS SEEN    Culture   Final    NO GROWTH 3 DAYS Performed at [facility], [place]  [address] [place], [place] [address]    Report Status 01/30/2021 FINAL  Final  Fungus Culture Result     Status: None   Collection Time: 01/27/21  8:50 AM  Result Value Ref Range Status   Result 1 Comment  Final    Comment: (NOTE) KOH/Calcofluor preparation:  no fungus observed. Performed At: [facility] [place], [place] [id number] Rush Farmer MD [id number]   Resp Panel by RT-PCR (Flu A&B, Covid) Nasopharyngeal Swab     Status: None   Collection Time: 01/31/21 11:49 AM   Specimen: Nasopharyngeal Swab; Nasopharyngeal(NP) swabs in vial transport medium  Result Value Ref Range Status   SARS Coronavirus 2 by RT PCR NEGATIVE NEGATIVE Final    Comment: (NOTE) SARS-CoV-2 target nucleic acids are NOT DETECTED.  The SARS-CoV-2 RNA is generally detectable in upper respiratory specimens during the acute phase of infection. The lowest concentration of SARS-CoV-2 viral copies this assay can detect is 138 copies/mL. A negative result does not preclude SARS-Cov-2 infection and should not be used as the sole basis for treatment or other patient management decisions. A negative result may occur with  improper specimen collection/handling, submission of specimen other than nasopharyngeal swab, presence of viral mutation(s) within the areas targeted by this assay, and inadequate number of viral copies(<138 copies/mL). A negative result must be combined with clinical observations, patient history, and epidemiological information. The expected result is Negative.  Fact Sheet for Patients:  EntrepreneurPulse.com.au  Fact Sheet for Healthcare Providers:  IncredibleEmployment.be  This test is no t yet approved or cleared by the [place] FDA and  has been authorized for detection and/or diagnosis of SARS-CoV-2 by FDA under an Emergency  Use Authorization (EUA). This EUA will remain  in effect (meaning this test can be used) for the duration of the COVID-19 declaration under Section 564(b)(1) of the Act, 21 U.S.C.section 360bbb-3(b)(1), unless the authorization is terminated  or revoked sooner.       Influenza A by PCR NEGATIVE NEGATIVE Final   Influenza B by PCR NEGATIVE NEGATIVE Final    Comment: (NOTE) The Xpert Xpress SARS-CoV-2/FLU/RSV plus assay is intended as an aid in the diagnosis of influenza from Nasopharyngeal swab specimens and should not be used as a sole basis for treatment. Nasal washings and aspirates are unacceptable for Xpert Xpress SARS-CoV-2/FLU/RSV testing.  Fact Sheet for Patients: EntrepreneurPulse.com.au  Fact Sheet for Healthcare Providers: IncredibleEmployment.be  This test is not yet approved or cleared by the [place] FDA and has been authorized for detection and/or diagnosis of SARS-CoV-2 by FDA under an Emergency Use Authorization (EUA). This EUA will remain in effect (meaning this test can be used) for the duration of the COVID-19 declaration under Section 564(b)(1) of the Act, 21 U.S.C. section 360bbb-3(b)(1), unless the authorization is terminated or revoked.  Performed at [facility], [address], [place], [place] [address]          Radiology Studies: No results found.      Scheduled Meds: . amiodarone  200 mg Oral BID  . aspirin EC  81 mg Oral BID  . fluticasone  1 spray Each Nare Daily  . heparin  5,000 Units Subcutaneous Q8H  . losartan  25 mg Oral Daily  . mirtazapine  15 mg Oral QHS  . sodium bicarbonate  1,300 mg Oral BID  . sodium chloride flush  3 mL Intravenous Q12H  . torsemide  40 mg Oral Daily   Continuous Infusions: . sodium chloride 250 mL (  02/03/21 0825)     LOS: 4 days    Time spent: 25 minutes    Sidney Ace, MD [facility] Pager [phone]  If 7PM-7AM,  please contact night-coverage 02/04/2021, 1:21 PM

## 2021-02-04 NOTE — Progress Notes (Signed)
Occupational Therapy Treatment Patient Details Name: [name] MRN: [id number] DOB: [birth year] Today's Date: 02/04/2021    History of present illness Pt is an 87 y/o M with PMH: MGUS, chronic kidney disease stage IIIb, coronary disease status post CABG in the past, HFrEF, status post aortic valve replacement, hypertension and hyperlipidemia. Pt presented to ED d/t progressive worsening dyspnea and LE edema. Pt adm for CHF exacerbation. EF 15-20% on repeat echo.   OT comments  Pt seen for OT treatment this date to f/u re: safety with ADLs/ADL mobility. Pt presents motivated and wanting to get OOB this AM. Pt requires MIN A with ADL transfers and CGA for fxl mobility to/from nurse's station with one standing rest break. Pt attempts to trial walking w/o nasal cannula, but de-sats to 86-87% on RA, requires 3Lnc while performing fxl mobility to sustain sats >90%. Pt resting on RA able to sustain sats >91%. Pt left in chair with chair alarm and all needs met and in reach. RN notified of session contents. Will continue to follow acutely.    Follow Up Recommendations  Home health OT;Supervision - Intermittent    Equipment Recommendations  3 in 1 bedside commode;Tub/shower seat    Recommendations for Other Services      Precautions / Restrictions Precautions Precautions: Fall Restrictions Weight Bearing Restrictions: No       Mobility Bed Mobility Overal bed mobility: Needs Assistance Bed Mobility: Sit to Supine       Sit to supine: Min guard;Supervision;HOB elevated   General bed mobility comments: increased time    Transfers Overall transfer level: Needs assistance Equipment used: Rolling walker (2 wheeled) Transfers: Sit to/from Stand Sit to Stand: Supervision;Min guard         General transfer comment: increasd time, cues for hand placement    Balance Overall balance assessment: Needs assistance   Sitting balance-Leahy Scale: Good     Standing balance support:  Bilateral upper extremity supported;Single extremity supported Standing balance-Leahy Scale: Fair Standing balance comment: at leat unilateral UE support                           ADL either performed or assessed with clinical judgement   ADL Overall ADL's : Needs assistance/impaired                                     Functional mobility during ADLs: Minimal assistance;Min guard;Rolling walker (~30' to nurses station and back)       Vision Patient Visual Report: No change from baseline     Perception     Praxis      Cognition Arousal/Alertness: Awake/alert Behavior During Therapy: WFL for tasks assessed/performed Overall Cognitive Status: Within Functional Limits for tasks assessed                                          Exercises Other Exercises Other Exercises: OT engages pt in fxl mobility to simulate HH distance, ed re: PLB   Shoulder Instructions       General Comments      Pertinent Vitals/ Pain       Pain Assessment: Faces Faces Pain Scale: Hurts a little bit Pain Location: LE with mobility Pain Descriptors / Indicators: Discomfort;Sore Pain Intervention(s): Limited activity within patient's  tolerance;Monitored during session;Repositioned  Home Living                                          Prior Functioning/Environment              Frequency  Min 1X/week        Progress Toward Goals  OT Goals(current goals can now be found in the care plan section)  Progress towards OT goals: Progressing toward goals  Acute Rehab OT Goals Patient Stated Goal: to go home OT Goal Formulation: With patient Time For Goal Achievement: 02/15/21 Potential to Achieve Goals: Good  Plan Discharge plan remains appropriate    Co-evaluation                 AM-PAC OT "6 Clicks" Daily Activity     Outcome Measure   Help from another person eating meals?: None Help from another person taking  care of personal grooming?: A Little Help from another person toileting, which includes using toliet, bedpan, or urinal?: A Little Help from another person bathing (including washing, rinsing, drying)?: A Little Help from another person to put on and taking off regular upper body clothing?: A Little Help from another person to put on and taking off regular lower body clothing?: A Lot 6 Click Score: 18    End of Session Equipment Utilized During Treatment: Gait belt;Oxygen  OT Visit Diagnosis: Unsteadiness on feet (R26.81);Muscle weakness (generalized) (M62.81)   Activity Tolerance Patient tolerated treatment well   Patient Left in chair;with call bell/phone within reach;with chair alarm set   Nurse Communication Mobility status;Other (comment) (O2 status, pt wanting to try on RA, sats >91% at rest, drops to 86-87% on RA, requires 2-3Lnc)        Time: 1103-1594 OT Time Calculation (min): 23 min  Charges: OT General Charges $OT Visit: 1 Visit OT Treatments $Therapeutic Activity: 23-37 mins  Gerrianne Scale, [place], OTR/L ascom [phone] 02/04/21, 2:06 PM

## 2021-02-05 ENCOUNTER — Inpatient Hospital Stay: Payer: PPO

## 2021-02-05 ENCOUNTER — Encounter: Payer: Self-pay | Admitting: Internal Medicine

## 2021-02-05 DIAGNOSIS — I5023 Acute on chronic systolic (congestive) heart failure: Secondary | ICD-10-CM | POA: Diagnosis not present

## 2021-02-05 LAB — BASIC METABOLIC PANEL
Anion gap: 12 (ref 5–15)
BUN: 83 mg/dL — ABNORMAL HIGH (ref 8–23)
CO2: 29 mmol/L (ref 22–32)
Calcium: 8.2 mg/dL — ABNORMAL LOW (ref 8.9–10.3)
Chloride: 100 mmol/L (ref 98–111)
Creatinine, Ser: 2.59 mg/dL — ABNORMAL HIGH (ref 0.61–1.24)
GFR, Estimated: 23 mL/min — ABNORMAL LOW (ref >60)
Glucose, Bld: 111 mg/dL — ABNORMAL HIGH (ref 70–99)
Potassium: 3.1 mmol/L — ABNORMAL LOW (ref 3.5–5.1)
Sodium: 141 mmol/L (ref 135–145)

## 2021-02-05 LAB — MAGNESIUM: Magnesium: 2.5 mg/dL — ABNORMAL HIGH (ref 1.7–2.4)

## 2021-02-05 MED ORDER — POTASSIUM CHLORIDE 20 MEQ PO PACK
20.0000 meq | PACK | Freq: Two times a day (BID) | ORAL | Status: AC
  Administered 2021-02-05 (×2): 20 meq via ORAL
  Filled 2021-02-05 (×2): qty 1

## 2021-02-05 NOTE — Progress Notes (Signed)
PROGRESS NOTE    [name]  FBP:[id number] DOB: [birth year] DOA: 01/31/2021 PCP: Baxter Hire, MD   Brief Narrative: 87 y.o. male with known history of MGUS, chronic kidney disease stage IIIb, coronary disease status post CABG in the past, HFrEF, status post aortic valve replacement, hypertension and hyperlipidemia.  Patient has had recurrent pleural effusion and is status post recent thoracentesis a few days ago.  He presents to the emergency room on account of progressive worsening dyspnea.  He also admitted to worsening lower extremity edema.  He admits compliance with his medication including diuretics with no significant improvement.  He has developed significant dyspnea with minimal exertion.  Seen by cardiology.  Remains symptomatic.  EF 15 to 20% on repeat echocardiogram.  BNP markedly elevated.  Minimal response to diuretics.  Case discussed with cardiology and nephrology.  Possible plan for Lasix GTT.  Lasix GTT initiated 5/19.  Patient endorse some symptomatic improvement.  Has converted to sinus rhythm.  Taken off amiodarone gtt. and converted to amiodarone 200 mg p.o. twice daily.  Kidney function continues to improve and patient continues to feel symptomatically better.  Lasix GTT stopped on 02/04/2021 and converted to p.o. torsemide 40 mg daily (home dose 20 mg torsemide)     Assessment & Plan:   Active Problems:   CHF exacerbation (HCC)   CHF (congestive heart failure), NYHA class IV, acute on chronic, systolic (HCC)   Unstageable pressure ulcer of sacral region [facility])  Acute hypoxic respiratory failure Acute decompensated systolic congestive heart failure Cardiology following Difficulty optimizing volume status  EF 15 to 20% Start Lasix GTT on 5/19.  Appears to be improving.  Less short of breath Lasix drip stopped 5/21.  Started back on torsemide 40 mg daily Plan: Continue torsemide 40 mg daily (home dose 20 mg daily) Wean oxygen as  tolerated  Tachyarrhythmia Suspected atrial fibrillation with aberrancy Cardiology following Started on amiodarone, now switched to p.o. Monitor electrolytes  Acute kidney injury on chronic kidney disease stage IIIb Improving over interval.  Cardiorenal syndrome versus progression of underlying CKD.  Lasix GTT stopped.  Currently on torsemide 40 daily.  Continue to monitor daily BMP and electrolytes.  Bilateral pleural effusion S/p thoracentesis Cautious use of diuretics  Hypertension Diuretics as above  Anxiety PTA Xanax   DVT prophylaxis: SQ heparin Code Status: DNR Family Communication: Daughter [name] [phone] on 5/21 Disposition Plan: Status is: Inpatient  Remains inpatient appropriate because:Inpatient level of care appropriate due to severity of illness   Dispo: The patient is from: Home              Anticipated d/c is to: Unknown at this time.  SNF versus home with home health              Patient currently is not medically stable to d/c.   Difficult to place patient No  Severe heart failure with associated fluid overload and DOE.  Improving over interval.  Hopefully will be medically ready for discharge within 24 hours.     Level of care: Progressive Cardiac  Consultants:   Cardiology  Nephrology  Procedures:   None  Antimicrobials:   None   Subjective: Patient seen and examined.  Improved breathing this morning.  Objective: Vitals:   02/05/21 0350 02/05/21 0500 02/05/21 0814 02/05/21 1309  BP: 106/68  100/80 117/73  Pulse:   82 89  Resp: 20  20   Temp: 97.7 F (36.5 C)  97.7 F (36.5 C)  TempSrc: Oral  Oral   SpO2: 97%  98% 100%  Weight:  70.4 kg    Height:        Intake/Output Summary (Last 24 hours) at 02/05/2021 1325 Last data filed at 02/05/2021 1230 Gross per 24 hour  Intake 1440 ml  Output 1950 ml  Net -510 ml   Filed Weights   02/04/21 0414 02/04/21 0900 02/05/21 0500  Weight: 70.6 kg 68.8 kg 70.4 kg     Examination:  General exam: Appears calm and comfortable  Respiratory system: Crackles at bases.  Normal work of breathing.  2 L  cardiovascular system: Regular rate and rhythm, no murmurs Gastrointestinal system: Abdomen is nondistended, soft and nontender. No organomegaly or masses felt. Normal bowel sounds heard. Central nervous system: Alert and oriented. No focal neurological deficits. Extremities: Symmetric 5 x 5 power. Skin: No rashes, lesions or ulcers Psychiatry: Judgement and insight appear normal. Mood & affect appropriate.     Data Reviewed: I have personally reviewed following labs and imaging studies  CBC: Recent Labs  Lab 01/31/21 1109 02/03/21 0504  WBC 7.7 13.3*  NEUTROABS 5.7  --   HGB 14.6 12.9*  HCT 42.2 38.6*  MCV 91.5 91.5  PLT 210 664*   Basic Metabolic Panel: Recent Labs  Lab 02/01/21 0456 02/02/21 0452 02/03/21 0504 02/04/21 0457 02/05/21 0638  NA 140 139 138 140 141  K 3.8 3.8 3.3* 3.5 3.1*  CL 99 98 98 100 100  CO2 25 24 27 28 29   GLUCOSE 113* 112* 108* 109* 111*  BUN 99* 113* 105* 93* 83*  CREATININE 3.41* 3.67* 3.40* 3.12* 2.59*  CALCIUM 8.9 8.7* 8.6* 8.2* 8.2*  MG 3.2*  --   --  2.7* 2.5*   GFR: Estimated Creatinine Clearance: 20 mL/min (A) (by C-G formula based on SCr of 2.59 mg/dL (H)). Liver Function Tests: Recent Labs  Lab 01/31/21 1109 02/04/21 0457  AST 92* 52*  ALT 77* 77*  ALKPHOS 137*  --   BILITOT 3.0*  --   PROT 6.9  --   ALBUMIN 3.7  --    No results for input(s): LIPASE, AMYLASE in the last 168 hours. No results for input(s): AMMONIA in the last 168 hours. Coagulation Profile: No results for input(s): INR, PROTIME in the last 168 hours. Cardiac Enzymes: No results for input(s): CKTOTAL, CKMB, CKMBINDEX, TROPONINI in the last 168 hours. BNP (last 3 results) No results for input(s): PROBNP in the last 8760 hours. HbA1C: No results for input(s): HGBA1C in the last 72 hours. CBG: Recent Labs  Lab  02/01/21 1230 02/01/21 1638  GLUCAP 112* 149*   Lipid Profile: No results for input(s): CHOL, HDL, LDLCALC, TRIG, CHOLHDL, LDLDIRECT in the last 72 hours. Thyroid Function Tests: No results for input(s): TSH, T4TOTAL, FREET4, T3FREE, THYROIDAB in the last 72 hours. Anemia Panel: No results for input(s): VITAMINB12, FOLATE, FERRITIN, TIBC, IRON, RETICCTPCT in the last 72 hours. Sepsis Labs: Recent Labs  Lab 02/02/21 0452  PROCALCITON 0.19    Recent Results (from the past 240 hour(s))  Fungus Culture With Stain     Status: None (Preliminary result)   Collection Time: 01/27/21  8:50 AM   Specimen: PATH Cytology Pleural fluid  Result Value Ref Range Status   Fungus Stain Final report  Final    Comment: (NOTE) Performed At: [facility] [place], [place] [id number] Rush Farmer MD DG:[id number]    Fungus (Mycology) Culture PENDING  Incomplete   Fungal Source PLEURAL  Final    Comment: Performed at [facility], [place], [place] [address]  Acid Fast Smear (AFB)     Status: None   Collection Time: 01/27/21  8:50 AM   Specimen: PATH Cytology Pleural fluid  Result Value Ref Range Status   AFB Specimen Processing Concentration  Final   Acid Fast Smear Negative  Final    Comment: (NOTE) Performed At: [facility] [place], [place] [id number] Rush Farmer MD GQ:[id number]    Source (AFB) PLEURAL  Final    Comment: Performed at [facility], [address] [place], [place] [address]  Body fluid culture w Gram Stain     Status: None   Collection Time: 01/27/21  8:50 AM   Specimen: PATH Cytology Pleural fluid  Result Value Ref Range Status   Specimen Description   Final    PLEURAL Performed at [facility], [address] [place], [place] [address]    Special Requests   Final    NONE Performed at [name], [address] [place], [place] [address]    Gram Stain    Final    FEW WBC PRESENT, PREDOMINANTLY MONONUCLEAR NO ORGANISMS SEEN    Culture   Final    NO GROWTH 3 DAYS Performed at [facility], [place] [address] [place], [place] [address]    Report Status 01/30/2021 FINAL  Final  Fungus Culture Result     Status: None   Collection Time: 01/27/21  8:50 AM  Result Value Ref Range Status   Result 1 Comment  Final    Comment: (NOTE) KOH/Calcofluor preparation:  no fungus observed. Performed At: [facility] [place], [place] [id number] Rush Farmer MD TK:[id number]   Resp Panel by RT-PCR (Flu A&B, Covid) Nasopharyngeal Swab     Status: None   Collection Time: 01/31/21 11:49 AM   Specimen: Nasopharyngeal Swab; Nasopharyngeal(NP) swabs in vial transport medium  Result Value Ref Range Status   SARS Coronavirus 2 by RT PCR NEGATIVE NEGATIVE Final    Comment: (NOTE) SARS-CoV-2 target nucleic acids are NOT DETECTED.  The SARS-CoV-2 RNA is generally detectable in upper respiratory specimens during the acute phase of infection. The lowest concentration of SARS-CoV-2 viral copies this assay can detect is 138 copies/mL. A negative result does not preclude SARS-Cov-2 infection and should not be used as the sole basis for treatment or other patient management decisions. A negative result may occur with  improper specimen collection/handling, submission of specimen other than nasopharyngeal swab, presence of viral mutation(s) within the areas targeted by this assay, and inadequate number of viral copies(<138 copies/mL). A negative result must be combined with clinical observations, patient history, and epidemiological information. The expected result is Negative.  Fact Sheet for Patients:  EntrepreneurPulse.com.au  Fact Sheet for Healthcare Providers:  IncredibleEmployment.be  This test is no t yet approved or cleared by the [place] FDA and  has been authorized for detection  and/or diagnosis of SARS-CoV-2 by FDA under an Emergency Use Authorization (EUA). This EUA will remain  in effect (meaning this test can be used) for the duration of the COVID-19 declaration under Section 564(b)(1) of the Act, 21 U.S.C.section 360bbb-3(b)(1), unless the authorization is terminated  or revoked sooner.       Influenza A by PCR NEGATIVE NEGATIVE Final   Influenza B by PCR NEGATIVE NEGATIVE Final    Comment: (NOTE) The Xpert Xpress SARS-CoV-2/FLU/RSV plus assay is intended as  an aid in the diagnosis of influenza from Nasopharyngeal swab specimens and should not be used as a sole basis for treatment. Nasal washings and aspirates are unacceptable for Xpert Xpress SARS-CoV-2/FLU/RSV testing.  Fact Sheet for Patients: EntrepreneurPulse.com.au  Fact Sheet for Healthcare Providers: IncredibleEmployment.be  This test is not yet approved or cleared by the [place] FDA and has been authorized for detection and/or diagnosis of SARS-CoV-2 by FDA under an Emergency Use Authorization (EUA). This EUA will remain in effect (meaning this test can be used) for the duration of the COVID-19 declaration under Section 564(b)(1) of the Act, 21 U.S.C. section 360bbb-3(b)(1), unless the authorization is terminated or revoked.  Performed at [facility], [address], [place], [place] [address]          Radiology Studies: No results found.      Scheduled Meds: . amiodarone  200 mg Oral BID  . aspirin EC  81 mg Oral BID  . fluticasone  1 spray Each Nare Daily  . heparin  5,000 Units Subcutaneous Q8H  . losartan  25 mg Oral Daily  . mirtazapine  15 mg Oral QHS  . sodium bicarbonate  1,300 mg Oral BID  . sodium chloride flush  3 mL Intravenous Q12H  . torsemide  40 mg Oral Daily   Continuous Infusions: . sodium chloride 250 mL (02/03/21 0825)     LOS: 5 days    Time spent: 25 minutes    Sidney Ace,  MD [facility] Pager [phone]  If 7PM-7AM, please contact night-coverage 02/05/2021, 1:25 PM

## 2021-02-05 NOTE — Plan of Care (Signed)
  Problem: Activity: Goal: Capacity to carry out activities will improve Outcome: Progressing   Problem: Health Behavior/Discharge Planning: Goal: Ability to manage health-related needs will improve Outcome: Progressing   Problem: Clinical Measurements: Goal: Respiratory complications will improve Outcome: Progressing   Problem: Clinical Measurements: Goal: Cardiovascular complication will be avoided Outcome: Progressing   Problem: Safety: Goal: Ability to remain free from injury will improve Outcome: Progressing

## 2021-02-05 NOTE — Progress Notes (Signed)
[facility]  ROUNDING NOTE   Subjective:   [name] is a 87 y.o. male with past medical history of CAD with CABG, hypertension, hyperlipidemia, aortic valve replacement, heart failure with reduced EF, and CKD stage 3B. He presents to the ED with shortness of breath that has progressed for a while.   He is a current patient of [facility], seen by Dr Holley Raring. He states he missed his appointment earlier this week.   Patient seen today . Patient was today out of bed having his breakfast on a chair. Patient offers no new physical complaints. Patient main concern today visit was " tell me how my kidneys are doing?" I then discussed patient kidney related issues    Objective:  Vital signs in last 24 hours:  Temp:  [97.6 F (36.4 C)-98.2 F (36.8 C)] 97.7 F (36.5 C) (05/22 0814) Pulse Rate:  [82-89] 89 (05/22 1309) Resp:  [12-38] 20 (05/22 0814) BP: (100-117)/(66-80) 117/73 (05/22 1309) SpO2:  [96 %-100 %] 100 % (05/22 1309) Weight:  [70.4 kg] 70.4 kg (05/22 0500)  Weight change: 0.7 kg Filed Weights   02/04/21 0414 02/04/21 0900 02/05/21 0500  Weight: 70.6 kg 68.8 kg 70.4 kg    Intake/Output: I/O last 3 completed shifts: In: 2066.1 [P.O.:2038; I.V.:28.1] Out: 4300 [Urine:4300]   Intake/Output this shift:  Total I/O In: 480 [P.O.:480] Out: 350 [Urine:350]  Physical Exam: General: NAD, sitting in chair  Head: Normocephalic, atraumatic. Moist oral mucosal membranes  Eyes: Anicteric  Lungs:  Fine crackles, 2L [place]  Heart: Regular rate and rhythm  Abdomen:  Soft, nontender,   Extremities:  1+ peripheral edema RLE.  Neurologic: Alert, moving all four extremities  Skin: No lesions       Basic Metabolic Panel: Recent Labs  Lab 02/01/21 0456 02/02/21 0452 02/03/21 0504 02/04/21 0457 02/05/21 0638  NA 140 139 138 140 141  K 3.8 3.8 3.3* 3.5 3.1*  CL 99 98 98 100 100  CO2 25 24 27 28 29   GLUCOSE 113* 112* 108* 109* 111*  BUN 99* 113* 105* 93* 83*   CREATININE 3.41* 3.67* 3.40* 3.12* 2.59*  CALCIUM 8.9 8.7* 8.6* 8.2* 8.2*  MG 3.2*  --   --  2.7* 2.5*    Liver Function Tests: Recent Labs  Lab 01/31/21 1109 02/04/21 0457  AST 92* 52*  ALT 77* 77*  ALKPHOS 137*  --   BILITOT 3.0*  --   PROT 6.9  --   ALBUMIN 3.7  --    No results for input(s): LIPASE, AMYLASE in the last 168 hours. No results for input(s): AMMONIA in the last 168 hours.  CBC: Recent Labs  Lab 01/31/21 1109 02/03/21 0504  WBC 7.7 13.3*  NEUTROABS 5.7  --   HGB 14.6 12.9*  HCT 42.2 38.6*  MCV 91.5 91.5  PLT 210 141*    Cardiac Enzymes: No results for input(s): CKTOTAL, CKMB, CKMBINDEX, TROPONINI in the last 168 hours.  BNP: Invalid input(s): POCBNP  CBG: Recent Labs  Lab 02/01/21 1230 02/01/21 1638  GLUCAP 112* 149*    Microbiology: Results for orders placed or performed during the hospital encounter of 01/31/21  Resp Panel by RT-PCR (Flu A&B, Covid) Nasopharyngeal Swab     Status: None   Collection Time: 01/31/21 11:49 AM   Specimen: Nasopharyngeal Swab; Nasopharyngeal(NP) swabs in vial transport medium  Result Value Ref Range Status   SARS Coronavirus 2 by RT PCR NEGATIVE NEGATIVE Final    Comment: (NOTE) SARS-CoV-2 target  nucleic acids are NOT DETECTED.  The SARS-CoV-2 RNA is generally detectable in upper respiratory specimens during the acute phase of infection. The lowest concentration of SARS-CoV-2 viral copies this assay can detect is 138 copies/mL. A negative result does not preclude SARS-Cov-2 infection and should not be used as the sole basis for treatment or other patient management decisions. A negative result may occur with  improper specimen collection/handling, submission of specimen other than nasopharyngeal swab, presence of viral mutation(s) within the areas targeted by this assay, and inadequate number of viral copies(<138 copies/mL). A negative result must be combined with clinical observations, patient history,  and epidemiological information. The expected result is Negative.  Fact Sheet for Patients:  EntrepreneurPulse.com.au  Fact Sheet for Healthcare Providers:  IncredibleEmployment.be  This test is no t yet approved or cleared by the [place] FDA and  has been authorized for detection and/or diagnosis of SARS-CoV-2 by FDA under an Emergency Use Authorization (EUA). This EUA will remain  in effect (meaning this test can be used) for the duration of the COVID-19 declaration under Section 564(b)(1) of the Act, 21 U.S.C.section 360bbb-3(b)(1), unless the authorization is terminated  or revoked sooner.       Influenza A by PCR NEGATIVE NEGATIVE Final   Influenza B by PCR NEGATIVE NEGATIVE Final    Comment: (NOTE) The Xpert Xpress SARS-CoV-2/FLU/RSV plus assay is intended as an aid in the diagnosis of influenza from Nasopharyngeal swab specimens and should not be used as a sole basis for treatment. Nasal washings and aspirates are unacceptable for Xpert Xpress SARS-CoV-2/FLU/RSV testing.  Fact Sheet for Patients: EntrepreneurPulse.com.au  Fact Sheet for Healthcare Providers: IncredibleEmployment.be  This test is not yet approved or cleared by the [place] FDA and has been authorized for detection and/or diagnosis of SARS-CoV-2 by FDA under an Emergency Use Authorization (EUA). This EUA will remain in effect (meaning this test can be used) for the duration of the COVID-19 declaration under Section 564(b)(1) of the Act, 21 U.S.C. section 360bbb-3(b)(1), unless the authorization is terminated or revoked.  Performed at [facility], [address] [place], [place] [address]     Coagulation Studies: No results for input(s): LABPROT, INR in the last 72 hours.  Urinalysis: No results for input(s): COLORURINE, LABSPEC, PHURINE, GLUCOSEU, HGBUR, BILIRUBINUR, KETONESUR, PROTEINUR, UROBILINOGEN,  NITRITE, LEUKOCYTESUR in the last 72 hours.  Invalid input(s): APPERANCEUR    Imaging: No results found.   Medications:   . sodium chloride 250 mL (02/03/21 0825)   . amiodarone  200 mg Oral BID  . aspirin EC  81 mg Oral BID  . fluticasone  1 spray Each Nare Daily  . heparin  5,000 Units Subcutaneous Q8H  . losartan  25 mg Oral Daily  . mirtazapine  15 mg Oral QHS  . sodium bicarbonate  1,300 mg Oral BID  . sodium chloride flush  3 mL Intravenous Q12H  . torsemide  40 mg Oral Daily   sodium chloride, acetaminophen, albuterol, ALPRAZolam, melatonin, menthol-cetylpyridinium, ondansetron (ZOFRAN) IV, sodium chloride flush  Assessment/ Plan:  [name] is a 87 y.o.  male with past medical history of CAD with CABG, hypertension, hyperlipidemia, aortic valve replacement, heart failure with reduced EF, and CKD. He presents to the ED with shortness of breath that has progressed for a while.  #Renal Acute Kidney Injury on chronic kidney disease stage 4  with baseline creatinine 2.2 and GFR of 28 on 01/04/21. Marland Kitchen  #Acute on  Chronic systolic CHF,  Patient admitted with with Volume overload and acute pulmonary edema.  Patient 2D echo shows left ventricular ejection fraction, by estimation, is <20%.   Acute kidney injury complicated by cardiorenal syndrome Acute kidney injury secondary to fluid overload No IV contrast exposure Dialysis not indicated at this time Creatinine is improving We  transitioned patient from Lasix drip to po diuretics yesterday  Will monitor renal function  Lab Results  Component Value Date   CREATININE 2.59 (H) 02/05/2021   CREATININE 3.12 (H) 02/04/2021   CREATININE 3.40 (H) 02/03/2021    Intake/Output Summary (Last 24 hours) at 02/05/2021 1405 Last data filed at 02/05/2021 1230 Gross per 24 hour  Intake 1440 ml  Output 1950 ml  Net -510 ml   # Anemia of chronic kidney disease Lab Results  Component Value Date   HGB 12.9 (L) 02/03/2021   Hgb at goal Will monitor  # Hypotension   Patient blood pressure is on lower side This is most likely secondary to IV diuresis Patient is on losartan as well. We will continue the current medication.  #  Hypokalemia Secondary to diuretics We will replete  Plan  We will replete potassium Will continue  on PO Torsemide 40mg  po daily.    LOS: 5 Har[place] Bula s [facility] 5/22/20222:05 PM

## 2021-02-05 NOTE — Progress Notes (Signed)
[facility] Cardiology    SUBJECTIVE: Feels much better less shortness of breath no leg swelling energy is improved still wearing oxygen and is hopeful to get off of supplemental oxygen.  No chest pain   Vitals:   02/04/21 1952 02/05/21 0000 02/05/21 0350 02/05/21 0500  BP: 103/66 103/79 106/68   Pulse:      Resp: 20 20 20    Temp: 98.2 F (36.8 C) 97.7 F (36.5 C) 97.7 F (36.5 C)   TempSrc: Oral Oral Oral   SpO2: 96% 98% 97%   Weight:    70.4 kg  Height:         Intake/Output Summary (Last 24 hours) at 02/05/2021 1128 Last data filed at 02/05/2021 1014 Gross per 24 hour  Intake 1200 ml  Output 2300 ml  Net -1100 ml      PHYSICAL EXAM  General: Well developed, well nourished, in no acute distress HEENT:  Normocephalic and atramatic Neck:  No JVD.  Lungs: Clear bilaterally to auscultation and percussion. Heart: HRRR . Normal S1 and S2 without gallops or murmurs.  Abdomen: Bowel sounds are positive, abdomen soft and non-tender  Msk:  Back normal, normal gait. Normal strength and tone for age. Extremities: No clubbing, cyanosis or edema.   Neuro: Alert and oriented X 3. Psych:  Good affect, responds appropriately   LABS: Basic Metabolic Panel: Recent Labs    02/04/21 0457 02/05/21 0638  NA 140 141  K 3.5 3.1*  CL 100 100  CO2 28 29  GLUCOSE 109* 111*  BUN 93* 83*  CREATININE 3.12* 2.59*  CALCIUM 8.2* 8.2*  MG 2.7* 2.5*   Liver Function Tests: Recent Labs    02/04/21 0457  AST 52*  ALT 77*   No results for input(s): LIPASE, AMYLASE in the last 72 hours. CBC: Recent Labs    02/03/21 0504  WBC 13.3*  HGB 12.9*  HCT 38.6*  MCV 91.5  PLT 141*   Cardiac Enzymes: No results for input(s): CKTOTAL, CKMB, CKMBINDEX, TROPONINI in the last 72 hours. BNP: Invalid input(s): POCBNP D-Dimer: No results for input(s): DDIMER in the last 72 hours. Hemoglobin A1C: No results for input(s): HGBA1C in the last 72 hours. Fasting Lipid Panel: No results for  input(s): CHOL, HDL, LDLCALC, TRIG, CHOLHDL, LDLDIRECT in the last 72 hours. Thyroid Function Tests: No results for input(s): TSH, T4TOTAL, T3FREE, THYROIDAB in the last 72 hours.  Invalid input(s): FREET3 Anemia Panel: No results for input(s): VITAMINB12, FOLATE, FERRITIN, TIBC, IRON, RETICCTPCT in the last 72 hours.  No results found.   Echo severely depressed left ventricular function EF of 20%  TELEMETRY: Atrial fibrillation rate of around 95  ASSESSMENT AND PLAN:  Active Problems:   CHF exacerbation (HCC)   CHF (congestive heart failure), NYHA class IV, acute on chronic, systolic (HCC)   Unstageable pressure ulcer of sacral region [facility]) Acute on chronic renal insufficiency Generalized weakness fatigue Shortness of breath  Plan Agree with switching to p.o. Lasix Gradually increase activity possibly with physical therapy PO. amiodarone therapy Continue heart failure therapy for cardiomyopathy Hopefully patient will be stable enough for discharge tomorrow Hopefully patient is not hypoxic and can get off of supplemental O2   Yolonda Kida, MD 02/05/2021 11:28 AM

## 2021-02-06 ENCOUNTER — Other Ambulatory Visit: Payer: Self-pay | Admitting: *Deleted

## 2021-02-06 DIAGNOSIS — I5023 Acute on chronic systolic (congestive) heart failure: Secondary | ICD-10-CM | POA: Diagnosis not present

## 2021-02-06 DIAGNOSIS — I5022 Chronic systolic (congestive) heart failure: Secondary | ICD-10-CM

## 2021-02-06 LAB — BASIC METABOLIC PANEL
Anion gap: 10 (ref 5–15)
BUN: 72 mg/dL — ABNORMAL HIGH (ref 8–23)
CO2: 32 mmol/L (ref 22–32)
Calcium: 8.3 mg/dL — ABNORMAL LOW (ref 8.9–10.3)
Chloride: 99 mmol/L (ref 98–111)
Creatinine, Ser: 2.49 mg/dL — ABNORMAL HIGH (ref 0.61–1.24)
GFR, Estimated: 24 mL/min — ABNORMAL LOW (ref >60)
Glucose, Bld: 139 mg/dL — ABNORMAL HIGH (ref 70–99)
Potassium: 3.4 mmol/L — ABNORMAL LOW (ref 3.5–5.1)
Sodium: 141 mmol/L (ref 135–145)

## 2021-02-06 LAB — MAGNESIUM: Magnesium: 2.3 mg/dL (ref 1.7–2.4)

## 2021-02-06 NOTE — TOC Transition Note (Signed)
Transition of Care [facility]) - CM/SW Discharge Note   Patient Details  Name: [name] MRN: [id number] Date of Birth: June 22, 1931  Transition [facility]) CM/SW Contact:  Alberteen Sam, LCSW Phone Number: 02/06/2021, 1:29 PM   Clinical Narrative:     CSW spoke with Corene Cornea with [facility] to accept for [facility] PT, OT and RN.   CSW spoke with Zack with [employer], to deliver oxygen to patient's room and set up in the home.   Patient declining recommended wheel chair and no other discharge needs identified at this time.    Final next level of care: [place] Barriers to Discharge: No Barriers Identified   Patient Goals and CMS Choice Patient states their goals for this hospitalization and ongoing recovery are:: to go home CMS Medicare.gov Compare Post Acute Care list provided to:: Patient Choice offered to / list presented to : Patient  Discharge Placement                       Discharge Plan and Services                DME Arranged: Oxygen DME Agency: [employer] Date DME Agency Contacted: 02/06/21 Time DME Agency Contacted: [id number] Representative spoke with at DME Agency: [place]: PT,OT,Nurse's [place] Work [employer] Agency: [place] ([place]) Date [place]: 02/06/21 Time [place]: 1329 Representative spoke with at [place]: [place] ([place]) Interventions     Readmission Risk Interventions No flowsheet data found.

## 2021-02-06 NOTE — Consult Note (Signed)
   Heart Failure Nurse Navigator Note  HFrEF<20%  He presented to the emergency room with worsening shortness of breath and lower extremity edema  Comorbidities:  Chronic kidney disease Anemia Coronary artery disease status post CABG/aortic valve replacement Hypertension Hyperlipidemia  Medications:  Aspirin 81 mg daily Amiodarone 200 mg twice a day Cozaar 25 mg daily Torsemide 40 mg daily  Labs:  Magnesium 2.3 BMP is pending Weight is 73.2 kg, question if this is accurate as yesterday's weight was documented at 70.4 kg Blood pressure 114/73  Assessment:  General-he is awake and alert sitting up in the chair at bedside.  HEENT-wears glasses, pupils are equal, no JVD noted  Cardiac-heart tones of regular rate and rhythm.  Abdomen-soft nontender  Musculoskeletal-there is no lower extremity edema noted   Initial meeting with patient on this admission today.  He states that he weighs himself daily but if he noted a weight gain dates that he did not call his physician as he felt that he could not deal with it.,  Given suggestions of calling his cardiologist or once he gets established with the heart failure outpatient clinic that he could contact Ms. Hackney.  He voices understanding.  Discussed his diet, he does not use salt at the table, admits to using frozen TV dinners but states that he does that rarely.  He states that he likes to have peanut butter and jelly toast for breakfast, may eat a couple eggs and states that he does eat a lot of salads in which he puts carrots and tomatoes.  Also discussed making good choices when he eats at a restaurant.  He voices understanding.  He also voices that he would like to become active with outpatient cardiac rehab.  Attending physician made aware.  Orders were placed.  Also notified Heath Lark in cardiac rehab.  He was given the new living with heart failure teaching booklet, along with taking control of your heart failure  flyer along with low-sodium diet flier.  He was also given information about the outpatient heart failure clinic along with an appointment time.   Pricilla Riffle RN CHFN

## 2021-02-06 NOTE — Progress Notes (Signed)
[facility]  ROUNDING NOTE   Subjective:   [name] is a 87 y.o. male with past medical history of CAD with CABG, hypertension, hyperlipidemia, aortic valve replacement, heart failure with reduced EF, and CKD stage 3B. He presents to the ED with shortness of breath that has progressed for a while.   He is a current patient of [facility], seen by Dr Holley Raring. He states he missed his appointment earlier this week.   Patient seen laying in bed States breathing has improved but continues to be short of breath with activity Currently on 2L [place] Tolerating meals Patient seen later sitting up in chair   Objective:  Vital signs in last 24 hours:  Temp:  [97.4 F (36.3 C)-98.1 F (36.7 C)] 98 F (36.7 C) (05/23 0403) Pulse Rate:  [67-89] 67 (05/23 0403) Resp:  [18-20] 18 (05/23 0403) BP: (106-117)/(63-86) 114/73 (05/23 1000) SpO2:  [98 %-100 %] 98 % (05/23 0403) Weight:  [70.8 kg-73.2 kg] 73.2 kg (05/23 1000)  Weight change: 1.961 kg Filed Weights   02/05/21 0500 02/06/21 0403 02/06/21 1000  Weight: 70.4 kg 70.8 kg 73.2 kg    Intake/Output: I/O last 3 completed shifts: In: 1920 [P.O.:1920] Out: 3700 [Urine:3700]   Intake/Output this shift:  Total I/O In: 240 [P.O.:240] Out: 500 [Urine:500]  Physical Exam: General: NAD, sitting in chair  Head: Normocephalic, atraumatic. Moist oral mucosal membranes  Eyes: Anicteric  Lungs:  Crackles (improved), 2L [place]  Heart: Regular rate and rhythm  Abdomen:  Soft, nontender,   Extremities:  trace peripheral edema LLE.  Neurologic: Alert, moving all four extremities  Skin: No lesions       Basic Metabolic Panel: Recent Labs  Lab 02/01/21 0456 02/02/21 0452 02/03/21 0504 02/04/21 0457 02/05/21 0638 02/06/21 0504  NA 140 139 138 140 141  --   K 3.8 3.8 3.3* 3.5 3.1*  --   CL 99 98 98 100 100  --   CO2 25 24 27 28 29   --   GLUCOSE 113* 112* 108* 109* 111*  --   BUN 99* 113* 105* 93* 83*  --   CREATININE 3.41* 3.67*  3.40* 3.12* 2.59*  --   CALCIUM 8.9 8.7* 8.6* 8.2* 8.2*  --   MG 3.2*  --   --  2.7* 2.5* 2.3    Liver Function Tests: Recent Labs  Lab 01/31/21 1109 02/04/21 0457  AST 92* 52*  ALT 77* 77*  ALKPHOS 137*  --   BILITOT 3.0*  --   PROT 6.9  --   ALBUMIN 3.7  --    No results for input(s): LIPASE, AMYLASE in the last 168 hours. No results for input(s): AMMONIA in the last 168 hours.  CBC: Recent Labs  Lab 01/31/21 1109 02/03/21 0504  WBC 7.7 13.3*  NEUTROABS 5.7  --   HGB 14.6 12.9*  HCT 42.2 38.6*  MCV 91.5 91.5  PLT 210 141*    Cardiac Enzymes: No results for input(s): CKTOTAL, CKMB, CKMBINDEX, TROPONINI in the last 168 hours.  BNP: Invalid input(s): POCBNP  CBG: Recent Labs  Lab 02/01/21 1230 02/01/21 1638  GLUCAP 112* 149*    Microbiology: Results for orders placed or performed during the hospital encounter of 01/31/21  Resp Panel by RT-PCR (Flu A&B, Covid) Nasopharyngeal Swab     Status: None   Collection Time: 01/31/21 11:49 AM   Specimen: Nasopharyngeal Swab; Nasopharyngeal(NP) swabs in vial transport medium  Result Value Ref Range Status   SARS Coronavirus  2 by RT PCR NEGATIVE NEGATIVE Final    Comment: (NOTE) SARS-CoV-2 target nucleic acids are NOT DETECTED.  The SARS-CoV-2 RNA is generally detectable in upper respiratory specimens during the acute phase of infection. The lowest concentration of SARS-CoV-2 viral copies this assay can detect is 138 copies/mL. A negative result does not preclude SARS-Cov-2 infection and should not be used as the sole basis for treatment or other patient management decisions. A negative result may occur with  improper specimen collection/handling, submission of specimen other than nasopharyngeal swab, presence of viral mutation(s) within the areas targeted by this assay, and inadequate number of viral copies(<138 copies/mL). A negative result must be combined with clinical observations, patient history, and  epidemiological information. The expected result is Negative.  Fact Sheet for Patients:  EntrepreneurPulse.com.au  Fact Sheet for Healthcare Providers:  IncredibleEmployment.be  This test is no t yet approved or cleared by the [place] FDA and  has been authorized for detection and/or diagnosis of SARS-CoV-2 by FDA under an Emergency Use Authorization (EUA). This EUA will remain  in effect (meaning this test can be used) for the duration of the COVID-19 declaration under Section 564(b)(1) of the Act, 21 U.S.C.section 360bbb-3(b)(1), unless the authorization is terminated  or revoked sooner.       Influenza A by PCR NEGATIVE NEGATIVE Final   Influenza B by PCR NEGATIVE NEGATIVE Final    Comment: (NOTE) The Xpert Xpress SARS-CoV-2/FLU/RSV plus assay is intended as an aid in the diagnosis of influenza from Nasopharyngeal swab specimens and should not be used as a sole basis for treatment. Nasal washings and aspirates are unacceptable for Xpert Xpress SARS-CoV-2/FLU/RSV testing.  Fact Sheet for Patients: EntrepreneurPulse.com.au  Fact Sheet for Healthcare Providers: IncredibleEmployment.be  This test is not yet approved or cleared by the [place] FDA and has been authorized for detection and/or diagnosis of SARS-CoV-2 by FDA under an Emergency Use Authorization (EUA). This EUA will remain in effect (meaning this test can be used) for the duration of the COVID-19 declaration under Section 564(b)(1) of the Act, 21 U.S.C. section 360bbb-3(b)(1), unless the authorization is terminated or revoked.  Performed at [facility], [address] [place], [place] [address]     Coagulation Studies: No results for input(s): LABPROT, INR in the last 72 hours.  Urinalysis: No results for input(s): COLORURINE, LABSPEC, PHURINE, GLUCOSEU, HGBUR, BILIRUBINUR, KETONESUR, PROTEINUR, UROBILINOGEN,  NITRITE, LEUKOCYTESUR in the last 72 hours.  Invalid input(s): APPERANCEUR    Imaging: DG Chest Port 1 View  Result Date: 02/05/2021 CLINICAL DATA:  Hemoptysis and shortness of breath. EXAM: PORTABLE CHEST 1 VIEW COMPARISON:  02/02/2021 and prior radiographs FINDINGS: Cardiomegaly, aortic valve replacement and mild pulmonary vascular congestion noted. Bilateral LOWER lung opacities/atelectasis and bilateral pleural effusions are again identified, slightly increased on the RIGHT. There is no evidence of pneumothorax. No other changes noted. IMPRESSION: 1. Bilateral LOWER lung opacities/atelectasis and bilateral pleural effusions, slightly increased on the RIGHT. 2. Cardiomegaly with mild pulmonary vascular congestion. Electronically Signed   By: Margarette Canada M.D.   On: 02/05/2021 16:02     Medications:   . sodium chloride 250 mL (02/03/21 0825)   . amiodarone  200 mg Oral BID  . aspirin EC  81 mg Oral BID  . fluticasone  1 spray Each Nare Daily  . heparin  5,000 Units Subcutaneous Q8H  . losartan  25 mg Oral Daily  . mirtazapine  15 mg Oral QHS  . sodium bicarbonate  1,300 mg  Oral BID  . sodium chloride flush  3 mL Intravenous Q12H  . torsemide  40 mg Oral Daily   sodium chloride, acetaminophen, albuterol, ALPRAZolam, melatonin, menthol-cetylpyridinium, ondansetron (ZOFRAN) IV, sodium chloride flush  Assessment/ Plan:  Mr. DEREN DEGRAZIA is a 87 y.o.  male with past medical history of CAD with CABG, hypertension, hyperlipidemia, aortic valve replacement, heart failure with reduced EF, and CKD. He presents to the ED with shortness of breath that has progressed for a while.  # Acute Kidney Injury on chronic kidney disease stage 4  with baseline creatinine 2.2 and GFR of 28 on 01/04/21. Marland Kitchen Acute kidney injury complicated by cardiorenal syndrome Acute kidney injury secondary to fluid overload Transitioned to po diuretics over weekend Creatinine continues to improve UOP 2.1L Will  continue to monitor progress  #Acute on  Chronic systolic CHF,  Patient admitted with with Volume overload and acute pulmonary edema.  Patient 2D echo shows left ventricular ejection fraction, by estimation, is <20%.    # Anemia of chronic kidney disease Lab Results  Component Value Date   HGB 12.9 (L) 02/03/2021  Hgb above goal Will continue to monitor  # Hypotension   Patient blood pressure is on lower side This is most likely secondary to IV diuresis Patient is on losartan as well. We will continue the current medication.  #  Hypokalemia Secondary to diuretics Will continue to monitor     LOS: 6 Salil Raineri 5/23/202212:40 PM

## 2021-02-06 NOTE — Progress Notes (Signed)
PROGRESS NOTE    [name]  NID:[id number] DOB: 07/13/1931 DOA: 01/31/2021 PCP: Baxter Hire, MD   Brief Narrative: 87 y.o. male with known history of MGUS, chronic kidney disease stage IIIb, coronary disease status post CABG in the past, HFrEF, status post aortic valve replacement, hypertension and hyperlipidemia.  Patient has had recurrent pleural effusion and is status post recent thoracentesis a few days ago.  He presents to the emergency room on account of progressive worsening dyspnea.  He also admitted to worsening lower extremity edema.  He admits compliance with his medication including diuretics with no significant improvement.  He has developed significant dyspnea with minimal exertion.  Seen by cardiology.  Remains symptomatic.  EF 15 to 20% on repeat echocardiogram.  BNP markedly elevated.  Minimal response to diuretics.  Case discussed with cardiology and nephrology.  Possible plan for Lasix GTT.  Lasix GTT initiated 5/19.  Patient endorse some symptomatic improvement.  Has converted to sinus rhythm.  Taken off amiodarone gtt. and converted to amiodarone 200 mg p.o. twice daily.  Kidney function continues to improve and patient continues to feel symptomatically better.  Lasix GTT stopped on 02/04/2021 and converted to p.o. torsemide 40 mg daily (home dose 20 mg torsemide)  5/23: Patient approaching baseline.  Endorses some fatigue and poor sleep but overall improvement     Assessment & Plan:   Active Problems:   CHF exacerbation (HCC)   CHF (congestive heart failure), NYHA class IV, acute on chronic, systolic (HCC)   Unstageable pressure ulcer of sacral region [facility])  Acute hypoxic respiratory failure Acute decompensated systolic congestive heart failure Cardiology following Difficulty optimizing volume status  EF 15 to 20% Start Lasix GTT on 5/19.  Appears to be improving.  Less short of breath Lasix drip stopped 5/21.  Started back on torsemide 40 mg  daily Plan: Continue torsemide 40 mg daily (home dose 20 mg daily) Wean oxygen as tolerated Will need home oxygen Anticipate discharge home on torsemide 40 mg daily  Tachyarrhythmia Suspected atrial fibrillation with aberrancy Cardiology following Started on amiodarone, now switched to p.o. Monitor electrolytes  Acute kidney injury on chronic kidney disease stage IIIb Improving over interval.  Cardiorenal syndrome versus progression of underlying CKD.  Lasix GTT stopped.  Currently on torsemide 40 daily.  Continue to monitor daily BMP and electrolytes.  Bilateral pleural effusion S/p thoracentesis Cautious use of diuretics  Hypertension Diuretics as above  Anxiety PTA Xanax   DVT prophylaxis: SQ heparin Code Status: DNR Family Communication: Daughter [name] [phone] on 5/21 Disposition Plan: Status is: Inpatient  Remains inpatient appropriate because:Inpatient level of care appropriate due to severity of illness   Dispo: The patient is from: Home              Anticipated d/c is to: Unknown at this time.  SNF versus home with home health              Patient currently is not medically stable to d/c.   Difficult to place patient No  Heart failure, fluid overload, dyspnea on exertion.  Improving over interval.  Hopefully medically ready for discharge home within 24 hours.  Will need to establish home health, home DME oxygen prior to discharge.     Level of care: Progressive Cardiac  Consultants:   Cardiology  Nephrology  Procedures:   None  Antimicrobials:   None   Subjective: Patient seen and examined.  Improved breathing this morning.  Objective: Vitals:   02/05/21 2012  02/06/21 0013 02/06/21 0403 02/06/21 1000  BP: 106/63 106/82 113/86 114/73  Pulse: 86 81 67   Resp: 20 20 18    Temp: (!) 97.4 F (36.3 C) 98.1 F (36.7 C) 98 F (36.7 C)   TempSrc: Oral Oral    SpO2: 98% 98% 98%   Weight:   70.8 kg 73.2 kg  Height:        Intake/Output  Summary (Last 24 hours) at 02/06/2021 1448 Last data filed at 02/06/2021 1342 Gross per 24 hour  Intake 840 ml  Output 2250 ml  Net -1410 ml   Filed Weights   02/05/21 0500 02/06/21 0403 02/06/21 1000  Weight: 70.4 kg 70.8 kg 73.2 kg    Examination:  General exam: Appears calm and comfortable  Respiratory system: Crackles at bases.  Normal work of breathing.  2 L  cardiovascular system: Regular rate and rhythm, no murmurs Gastrointestinal system: Abdomen is nondistended, soft and nontender. No organomegaly or masses felt. Normal bowel sounds heard. Central nervous system: Alert and oriented. No focal neurological deficits. Extremities: Symmetric 5 x 5 power. Skin: No rashes, lesions or ulcers Psychiatry: Judgement and insight appear normal. Mood & affect appropriate.     Data Reviewed: I have personally reviewed following labs and imaging studies  CBC: Recent Labs  Lab 01/31/21 1109 02/03/21 0504  WBC 7.7 13.3*  NEUTROABS 5.7  --   HGB 14.6 12.9*  HCT 42.2 38.6*  MCV 91.5 91.5  PLT 210 409*   Basic Metabolic Panel: Recent Labs  Lab 02/01/21 0456 02/02/21 0452 02/03/21 0504 02/04/21 0457 02/05/21 0638 02/06/21 0504 02/06/21 1322  NA 140 139 138 140 141  --  141  K 3.8 3.8 3.3* 3.5 3.1*  --  3.4*  CL 99 98 98 100 100  --  99  CO2 25 24 27 28 29   --  32  GLUCOSE 113* 112* 108* 109* 111*  --  139*  BUN 99* 113* 105* 93* 83*  --  72*  CREATININE 3.41* 3.67* 3.40* 3.12* 2.59*  --  2.49*  CALCIUM 8.9 8.7* 8.6* 8.2* 8.2*  --  8.3*  MG 3.2*  --   --  2.7* 2.5* 2.3  --    GFR: Estimated Creatinine Clearance: 21.6 mL/min (A) (by C-G formula based on SCr of 2.49 mg/dL (H)). Liver Function Tests: Recent Labs  Lab 01/31/21 1109 02/04/21 0457  AST 92* 52*  ALT 77* 77*  ALKPHOS 137*  --   BILITOT 3.0*  --   PROT 6.9  --   ALBUMIN 3.7  --    No results for input(s): LIPASE, AMYLASE in the last 168 hours. No results for input(s): AMMONIA in the last 168  hours. Coagulation Profile: No results for input(s): INR, PROTIME in the last 168 hours. Cardiac Enzymes: No results for input(s): CKTOTAL, CKMB, CKMBINDEX, TROPONINI in the last 168 hours. BNP (last 3 results) No results for input(s): PROBNP in the last 8760 hours. HbA1C: No results for input(s): HGBA1C in the last 72 hours. CBG: Recent Labs  Lab 02/01/21 1230 02/01/21 1638  GLUCAP 112* 149*   Lipid Profile: No results for input(s): CHOL, HDL, LDLCALC, TRIG, CHOLHDL, LDLDIRECT in the last 72 hours. Thyroid Function Tests: No results for input(s): TSH, T4TOTAL, FREET4, T3FREE, THYROIDAB in the last 72 hours. Anemia Panel: No results for input(s): VITAMINB12, FOLATE, FERRITIN, TIBC, IRON, RETICCTPCT in the last 72 hours. Sepsis Labs: Recent Labs  Lab 02/02/21 0452  PROCALCITON 0.19    Recent  Results (from the past 240 hour(s))  Resp Panel by RT-PCR (Flu A&B, Covid) Nasopharyngeal Swab     Status: None   Collection Time: 01/31/21 11:49 AM   Specimen: Nasopharyngeal Swab; Nasopharyngeal(NP) swabs in vial transport medium  Result Value Ref Range Status   SARS Coronavirus 2 by RT PCR NEGATIVE NEGATIVE Final    Comment: (NOTE) SARS-CoV-2 target nucleic acids are NOT DETECTED.  The SARS-CoV-2 RNA is generally detectable in upper respiratory specimens during the acute phase of infection. The lowest concentration of SARS-CoV-2 viral copies this assay can detect is 138 copies/mL. A negative result does not preclude SARS-Cov-2 infection and should not be used as the sole basis for treatment or other patient management decisions. A negative result may occur with  improper specimen collection/handling, submission of specimen other than nasopharyngeal swab, presence of viral mutation(s) within the areas targeted by this assay, and inadequate number of viral copies(<138 copies/mL). A negative result must be combined with clinical observations, patient history, and  epidemiological information. The expected result is Negative.  Fact Sheet for Patients:  EntrepreneurPulse.com.au  Fact Sheet for Healthcare Providers:  IncredibleEmployment.be  This test is no t yet approved or cleared by the [place] FDA and  has been authorized for detection and/or diagnosis of SARS-CoV-2 by FDA under an Emergency Use Authorization (EUA). This EUA will remain  in effect (meaning this test can be used) for the duration of the COVID-19 declaration under Section 564(b)(1) of the Act, 21 U.S.C.section 360bbb-3(b)(1), unless the authorization is terminated  or revoked sooner.       Influenza A by PCR NEGATIVE NEGATIVE Final   Influenza B by PCR NEGATIVE NEGATIVE Final    Comment: (NOTE) The Xpert Xpress SARS-CoV-2/FLU/RSV plus assay is intended as an aid in the diagnosis of influenza from Nasopharyngeal swab specimens and should not be used as a sole basis for treatment. Nasal washings and aspirates are unacceptable for Xpert Xpress SARS-CoV-2/FLU/RSV testing.  Fact Sheet for Patients: EntrepreneurPulse.com.au  Fact Sheet for Healthcare Providers: IncredibleEmployment.be  This test is not yet approved or cleared by the [place] FDA and has been authorized for detection and/or diagnosis of SARS-CoV-2 by FDA under an Emergency Use Authorization (EUA). This EUA will remain in effect (meaning this test can be used) for the duration of the COVID-19 declaration under Section 564(b)(1) of the Act, 21 U.S.C. section 360bbb-3(b)(1), unless the authorization is terminated or revoked.  Performed at [facility], [address], [place], [place] [address]          Radiology Studies: [facility] Chest [place] 1 View  Result Date: 02/05/2021 CLINICAL DATA:  Hemoptysis and shortness of breath. EXAM: PORTABLE CHEST 1 VIEW COMPARISON:  02/02/2021 and prior radiographs FINDINGS:  Cardiomegaly, aortic valve replacement and mild pulmonary vascular congestion noted. Bilateral LOWER lung opacities/atelectasis and bilateral pleural effusions are again identified, slightly increased on the RIGHT. There is no evidence of pneumothorax. No other changes noted. IMPRESSION: 1. Bilateral LOWER lung opacities/atelectasis and bilateral pleural effusions, slightly increased on the RIGHT. 2. Cardiomegaly with mild pulmonary vascular congestion. Electronically Signed   By: Margarette Canada M.D.   On: 02/05/2021 16:02        Scheduled Meds: . amiodarone  200 mg Oral BID  . aspirin EC  81 mg Oral BID  . fluticasone  1 spray Each Nare Daily  . heparin  5,000 Units Subcutaneous Q8H  . losartan  25 mg Oral Daily  . mirtazapine  15 mg Oral QHS  .  sodium bicarbonate  1,300 mg Oral BID  . sodium chloride flush  3 mL Intravenous Q12H  . torsemide  40 mg Oral Daily   Continuous Infusions: . sodium chloride 250 mL (02/03/21 0825)     LOS: 6 days    Time spent: 15 minutes    Sidney Ace, MD [facility] Pager [phone]  If 7PM-7AM, please contact night-coverage 02/06/2021, 2:48 PM

## 2021-02-06 NOTE — Progress Notes (Signed)
Ambulated with patient down the hall on room air. His SpO2 went down to 87% while ambulating and back in room sitting down, he quickly came up to 90% at rest. On 2L nasal cannula at rest he is 99%.

## 2021-02-06 NOTE — Care Management Important Message (Signed)
Important Message  Patient Details  Name: [name] MRN: [id number] Date of Birth: 09-Jan-1931   Medicare Important Message Given:  Yes     Dannette Barbara 02/06/2021, 2:34 PM

## 2021-02-06 NOTE — Progress Notes (Signed)
CARDIOLOGY CONSULT NOTE               Patient ID: [name] MRN: [id number] DOB/AGE: 02/05/1931 87 y.o.  Admit date: 01/31/2021 Referring Physician Dr Priscella Mann: Hospitalist Primary Physician Dr Harrel Lemon Primary Cardiologist Dr Mauri Reading Reason for Consultation CHF/SOB  HPI: Patient complains of persistent dyspnea tried to walk without oxygen and became dyspneic he does not have oxygen at home but is still relatively short of breath denies any pain no leg swelling.  Currently patient is on. Lasix as well as amiodarone feels reasonably comfortable at rest except when he tries to eat he has significant dyspnea  Review of systems complete and found to be negative unless listed above     Past Medical History:  Diagnosis Date  . Anemia   . Aortic aneurysm ([place])   . Aortic aneurysm ([name])   . Arthritis   . CKD (chronic kidney disease)    ALSO LEFT RENAL MASS  . Coronary artery disease   . Diverticulosis    WITH RUPTURE  . GERD (gastroesophageal reflux disease)   . History of hiatal hernia   . Hypercholesteremia   . Hypertension   . MGUS (monoclonal gammopathy of unknown significance)   . MGUS (monoclonal gammopathy of unknown significance)     Past Surgical History:  Procedure Laterality Date  . AORTIC VALVE REPLACEMENT    . bipass    . CARDIAC VALVE REPLACEMENT     pig valve  . CHOLECYSTECTOMY    . COLON SURGERY    . CORONARY ANGIOPLASTY    . CORONARY ARTERY BYPASS GRAFT    . ESOPHAGOGASTRODUODENOSCOPY (EGD) WITH PROPOFOL N/A 02/07/2015   Procedure: ESOPHAGOGASTRODUODENOSCOPY (EGD) WITH PROPOFOL;  Surgeon: Hulen Luster, MD;  Location: [facility] ENDOSCOPY;  Service: Gastroenterology;  Laterality: N/A;  . EYE SURGERY    . HERNIA REPAIR    . INTRAMEDULLARY (IM) NAIL INTERTROCHANTERIC Right 05/12/2020   Procedure: INTRAMEDULLARY (IM) NAIL INTERTROCHANTRIC;  Surgeon: Lovell Sheehan, MD;  Location: [facility] ORS;  Service: Orthopedics;  Laterality: Right;  . JOINT  REPLACEMENT     left knee x4  . KNEE ARTHROPLASTY Right 04/02/2016   Procedure: COMPUTER ASSISTED TOTAL KNEE ARTHROPLASTY;  Surgeon: Dereck Leep, MD;  Location: [facility] ORS;  Service: Orthopedics;  Laterality: Right;  . ORIF FEMUR FRACTURE Right 08/08/2020   Procedure: OPEN REDUCTION INTERNAL FIXATION (ORIF) DISTAL FEMUR FRACTURE;  Surgeon: Lovell Sheehan, MD;  Location: [facility] ORS;  Service: Orthopedics;  Laterality: Right;    Medications Prior to Admission  Medication Sig Dispense Refill Last Dose  . albuterol (VENTOLIN HFA) 108 (90 Base) MCG/ACT inhaler Inhale into the lungs.   01/31/2021 at Unknown time  . ALPRAZolam (XANAX) 0.25 MG tablet Take 0.25 mg by mouth 2 (two) times daily as needed.   01/30/2021 at Unknown time  . aspirin EC 81 MG tablet Take 1 tablet (81 mg total) by mouth 2 (two) times daily. Swallow whole. 30 tablet 0 01/31/2021 at Unknown time  . fluticasone (FLONASE) 50 MCG/ACT nasal spray USE 2 SPRAYS INTO BOTH NOSTRILS DAILY   Past Week at Unknown time  . Melatonin 3 MG TABS Take 1.5 mg by mouth at bedtime as needed (sleep).   01/30/2021 at Unknown time  . pravastatin (PRAVACHOL) 20 MG tablet Take 20 mg by mouth every evening.   01/30/2021 at Unknown time  . sodium bicarbonate 650 MG tablet Take 1,300 mg by mouth 2 (two) times daily.   01/31/2021 at Unknown  time  . torsemide (DEMADEX) 20 MG tablet Take 2 tablets (40 mg total) by mouth daily. 60 tablet 1 01/31/2021 at Unknown time  . traMADol (ULTRAM) 50 MG tablet Take 50 mg by mouth every 4 (four) hours as needed.   01/30/2021 at Unknown time  . zolpidem (AMBIEN) 5 MG tablet Take by mouth.   01/30/2021 at Unknown time   Social History   Socioeconomic History  . Marital status: Widowed    Spouse name: Not on file  . Number of children: Not on file  . Years of education: Not on file  . Highest education level: Not on file  Occupational History  . Not on file  Tobacco Use  . Smoking status: Former Research scientist (life sciences)  . Smokeless tobacco:  Never Used  Substance and Sexual Activity  . Alcohol use: No  . Drug use: No  . Sexual activity: Not on file  Other Topics Concern  . Not on file  Social History Narrative  . Not on file   Social Determinants of Health   Financial Resource Strain: Not on file  Food Insecurity: Not on file  Transportation Needs: Not on file  Physical Activity: Not on file  Stress: Not on file  Social Connections: Not on file  Intimate Partner Violence: Not on file    Family History  Problem Relation Age of Onset  . Bladder Cancer Neg Hx   . Prostate cancer Neg Hx   . Kidney cancer Neg Hx       Review of systems complete and found to be negative unless listed above      PHYSICAL EXAM  General: Well developed, well nourished, in no acute distress HEENT:  Normocephalic and atramatic Neck:  No JVD.  Lungs: Clear bilaterally to auscultation and percussion. Heart: HRRR . Normal S1 and S2 without gallops or murmurs.  Abdomen: Bowel sounds are positive, abdomen soft and non-tender  Msk:  Back normal, normal gait. Normal strength and tone for age. Extremities: No clubbing, cyanosis or edema.   Neuro: Alert and oriented X 3. Psych:  Good affect, responds appropriately  Labs:   Lab Results  Component Value Date   WBC 13.3 (H) 02/03/2021   HGB 12.9 (L) 02/03/2021   HCT 38.6 (L) 02/03/2021   MCV 91.5 02/03/2021   PLT 141 (L) 02/03/2021    Recent Labs  Lab 01/31/21 1109 02/01/21 0456 02/04/21 0457 02/05/21 0638  NA 138   < > 140 141  K 3.7   < > 3.5 3.1*  CL 94*   < > 100 100  CO2 25   < > 28 29  BUN 91*   < > 93* 83*  CREATININE 3.43*   < > 3.12* 2.59*  CALCIUM 9.3   < > 8.2* 8.2*  PROT 6.9  --   --   --   BILITOT 3.0*  --   --   --   ALKPHOS 137*  --   --   --   ALT 77*  --  77*  --   AST 92*  --  52*  --   GLUCOSE 117*   < > 109* 111*   < > = values in this interval not displayed.   No results found for: CKTOTAL, CKMB, CKMBINDEX, TROPONINI No results found for:  CHOL No results found for: HDL No results found for: LDLCALC No results found for: TRIG No results found for: CHOLHDL No results found for: LDLDIRECT    Radiology: DG Chest  1 View  Result Date: 01/27/2021 CLINICAL DATA:  87 year old male with history of CHF status post left thoracentesis. EXAM: CHEST  1 VIEW COMPARISON:  01/25/2021 FINDINGS: Stable cardiomediastinal silhouette with unchanged moderate cardiomegaly. Persistent but decreased blunting of the right costophrenic angle. Persistent passive in cicatricial atelectasis in the right lower lobe. Similar appearing left costophrenic angle blunting and mild retrocardiac opacities. No evidence pneumothorax. No new focal consolidations. Atherosclerotic calcification of the aortic arch. Median sternotomy wires remain in place and appear intact in addition to postsurgical changes after coronary artery bypass and aortic valve replacement. No acute osseous abnormality. IMPRESSION: Trace residual right pleural effusion after right thoracentesis. No evidence of pneumothorax. Electronically Signed   By: Ruthann Cancer MD   On: 01/27/2021 09:37   DG Chest Port 1 View  Result Date: 02/05/2021 CLINICAL DATA:  Hemoptysis and shortness of breath. EXAM: PORTABLE CHEST 1 VIEW COMPARISON:  02/02/2021 and prior radiographs FINDINGS: Cardiomegaly, aortic valve replacement and mild pulmonary vascular congestion noted. Bilateral LOWER lung opacities/atelectasis and bilateral pleural effusions are again identified, slightly increased on the RIGHT. There is no evidence of pneumothorax. No other changes noted. IMPRESSION: 1. Bilateral LOWER lung opacities/atelectasis and bilateral pleural effusions, slightly increased on the RIGHT. 2. Cardiomegaly with mild pulmonary vascular congestion. Electronically Signed   By: Margarette Canada M.D.   On: 02/05/2021 16:02   DG Chest Port 1 View  Result Date: 02/02/2021 CLINICAL DATA:  Shortness of breath EXAM: PORTABLE CHEST 1 VIEW  COMPARISON:  Jan 31, 2021 FINDINGS: There is consolidation in a portion of the left base. There is a partially loculated pleural effusion on the left. There is a smaller partially loculated pleural effusion on the right. There is cardiomegaly with pulmonary vascularity within normal limits. Patient is status post coronary artery bypass grafting and aortic valve replacement. No adenopathy. There is aortic atherosclerosis. No bone lesions. IMPRESSION: Airspace opacity concerning for pneumonia with atelectasis left lower lobe. Loculated pleural effusion on each side, slightly larger on the left the right. Stable cardiomegaly with postoperative changes. Aortic Atherosclerosis (ICD10-I70.0). Electronically Signed   By: Lowella Grip III M.D.   On: 02/02/2021 08:15   DG Chest Port 1 View  Result Date: 01/31/2021 CLINICAL DATA:  Shortness of breath. EXAM: PORTABLE CHEST 1 VIEW COMPARISON:  Chest x-ray dated Jan 27, 2021. FINDINGS: Stable cardiomegaly status post CABG and AVR. Unchanged pulmonary vascular congestion and interstitial thickening. Unchanged small bilateral pleural effusions and bibasilar atelectasis. No pneumothorax. No acute osseous abnormality. IMPRESSION: 1. Unchanged congestive heart failure. Electronically Signed   By: Titus Dubin M.D.   On: 01/31/2021 11:47   DG Chest Port 1 View  Result Date: 01/25/2021 CLINICAL DATA:  CHF, status post left thoracentesis EXAM: PORTABLE CHEST 1 VIEW COMPARISON:  09/09/2020 FINDINGS: Improvement in the left effusion following thoracentesis. No pneumothorax. Small right effusion noted. Heart is enlarged with vascular congestion and basilar atelectasis versus scarring. Aorta atherosclerotic. Remote coronary bypass changes and cardiac valve replacement. Degenerative changes of the spine. Bones are osteopenic. IMPRESSION: No pneumothorax following left thoracentesis. Electronically Signed   By: Jerilynn Mages.  Shick M.D.   On: 01/25/2021 14:06   ECHOCARDIOGRAM  COMPLETE  Result Date: 02/01/2021    ECHOCARDIOGRAM REPORT   Patient Name:   [name] Date of Exam: 01/31/2021 Medical Rec #:  [id number]       Height:       71.0 in Accession #:    [id number]      Weight:  160.0 lb Date of Birth:  [birth year]       BSA:          1.918 m Patient Age:    44 years        BP:           122/101 mmHg Patient Gender: M               HR:           106 bpm. Exam Location:  [facility] Procedure: 2D Echo, Cardiac Doppler and Color Doppler Indications:     R06.03 Acute Respiratory distress  History:         Patient has no prior history of Echocardiogram examinations.                  Aortic Valve replacement; Risk Factors:Dyslipidemia, Former                  Smoker and Hypertension. Coronary artery disease. Chronic                  kidney disease.  Sonographer:     Wilford Sports Rodgers-Jones Referring Phys:  [id number] [place] Diagnosing Phys: Bartholome Bill MD IMPRESSIONS  1. Left ventricular ejection fraction, by estimation, is <20%. The left ventricle has severely decreased function. The left ventricle has no regional wall motion abnormalities. The left ventricular internal cavity size was mildly dilated. Left ventricular diastolic parameters were normal.  2. Right ventricular systolic function is normal. The right ventricular size is mildly enlarged.  3. Left atrial size was mildly dilated.  4. Right atrial size was mildly dilated.  5. The mitral valve is grossly normal. Mild to moderate mitral valve regurgitation.  6. Tricuspid valve regurgitation is moderate.  7. The aortic valve is grossly normal. Aortic valve regurgitation is trivial. Mild aortic valve stenosis.  8. Aortic dilatation noted. FINDINGS  Left Ventricle: Left ventricular ejection fraction, by estimation, is <20%. The left ventricle has severely decreased function. The left ventricle has no regional wall motion abnormalities. The left ventricular internal cavity size was mildly dilated. There is no left ventricular  hypertrophy. Left ventricular diastolic parameters were normal. Right Ventricle: The right ventricular size is mildly enlarged. Right vetricular wall thickness was not well visualized. Right ventricular systolic function is normal. Left Atrium: Left atrial size was mildly dilated. Right Atrium: Right atrial size was mildly dilated. Pericardium: There is no evidence of pericardial effusion. Mitral Valve: The mitral valve is grossly normal. Mild to moderate mitral valve regurgitation. Tricuspid Valve: The tricuspid valve is not well visualized. Tricuspid valve regurgitation is moderate. Aortic Valve: The aortic valve is grossly normal. Aortic valve regurgitation is trivial. Mild aortic stenosis is present. Aortic valve mean gradient measures 8.2 mmHg. Aortic valve peak gradient measures 12.9 mmHg. Aortic valve area, by VTI measures 0.77  cm. Pulmonic Valve: The pulmonic valve was not well visualized. Pulmonic valve regurgitation is mild. Aorta: Aortic dilatation noted. IAS/Shunts: The atrial septum is grossly normal.  LEFT VENTRICLE PLAX 2D LVIDd:         5.88 cm LVIDs:         5.57 cm LV PW:         0.79 cm LV IVS:        0.85 cm LVOT diam:     2.10 cm LV SV:         20 LV SV Index:   10 LVOT Area:     3.46 cm  RIGHT VENTRICLE  IVC RV Basal diam:  5.06 cm    IVC diam: 2.42 cm RV S prime:     7.62 cm/s TAPSE (M-mode): 0.6 cm LEFT ATRIUM             Index       RIGHT ATRIUM           Index LA diam:        4.80 cm 2.50 cm/m  RA Area:     27.90 cm LA Vol (A2C):   98.4 ml 51.31 ml/m RA Volume:   108.00 ml 56.31 ml/m LA Vol (A4C):   82.8 ml 43.17 ml/m LA Biplane Vol: 93.1 ml 48.55 ml/m  AORTIC VALVE AV Area (Vmax):    0.73 cm AV Area (Vmean):   0.72 cm AV Area (VTI):     0.77 cm AV Vmax:           179.25 cm/s AV Vmean:          133.750 cm/s AV VTI:            0.256 m AV Peak Grad:      12.9 mmHg AV Mean Grad:      8.2 mmHg LVOT Vmax:         37.93 cm/s LVOT Vmean:        27.833 cm/s LVOT VTI:           0.057 m LVOT/AV VTI ratio: 0.22  AORTA Ao Root diam: 4.10 cm MV E velocity: 65.00 cm/s  TRICUSPID VALVE MV A velocity: 35.90 cm/s  TR Peak grad:   30.7 mmHg MV E/A ratio:  1.81        TR Vmax:        277.00 cm/s                             SHUNTS                            Systemic VTI:  0.06 m                            Systemic Diam: 2.10 cm Bartholome Bill MD Electronically signed by Bartholome Bill MD Signature Date/Time: 02/01/2021/6:58:29 AM    Final    US THORACENTESIS ASP PLEURAL SPACE W/IMG GUIDE  Result Date: 01/27/2021 INDICATION: Patient with a history of heart failure and recurrent bilateral pleural effusions. Interventional radiology asked to perform a therapeutic and diagnostic thoracentesis. EXAM: ULTRASOUND GUIDED THORACENTESIS MEDICATIONS: % lidocaine 10 mL COMPLICATIONS: None immediate. PROCEDURE: An ultrasound guided thoracentesis was thoroughly discussed with the patient and questions answered. The benefits, risks, alternatives and complications were also discussed. The patient understands and wishes to proceed with the procedure. Written consent was obtained. Ultrasound was performed to localize and mark an adequate pocket of fluid in the right chest. The area was then prepped and draped in the normal sterile fashion. 1% Lidocaine was used for local anesthesia. Under ultrasound guidance a 6 Fr Safe-T-Centesis catheter was introduced. Thoracentesis was performed. The catheter was removed and a dressing applied. FINDINGS: A total of approximately 400 mL of amber-colored fluid was removed. Samples were sent to the laboratory as requested by the clinical team. IMPRESSION: Successful ultrasound guided right thoracentesis yielding 400 mL of pleural fluid. Read by: Soyla Dryer, NP Electronically Signed   By: Ruthann Cancer MD  On: 01/27/2021 09:41   US THORACENTESIS ASP PLEURAL SPACE W/IMG GUIDE  Result Date: 01/25/2021 INDICATION: CHF, pleural effusions EXAM: ULTRASOUND GUIDED LEFT THORACENTESIS  MEDICATIONS: 1% lidocaine local COMPLICATIONS: None immediate. PROCEDURE: An ultrasound guided thoracentesis was thoroughly discussed with the patient and questions answered. The benefits, risks, alternatives and complications were also discussed. The patient understands and wishes to proceed with the procedure. Written consent was obtained. Ultrasound was performed to localize and mark an adequate pocket of fluid in the left chest. The area was then prepped and draped in the normal sterile fashion. 1% Lidocaine was used for local anesthesia. Under ultrasound guidance a 6 Fr Safe-T-Centesis catheter was introduced. Thoracentesis was performed. The catheter was removed and a dressing applied. FINDINGS: A total of approximately 500 cc of blood tinged pleural fluid was removed. Samples were sent to the laboratory as requested by the clinical team. IMPRESSION: Successful ultrasound guided left thoracentesis yielding 500 cc of pleural fluid. Electronically Signed   By: Jerilynn Mages.  Shick M.D.   On: 01/25/2021 14:04    EKG:A Fibrillation rate of around 90  ASSESSMENT AND PLAN:  Shortness of breath dyspnea congestive heart failure Cardiomyopathy systolic Chronic renal insufficiency Generalized weakness and fatigue Ischemic cardiomyopathy Hypoxemia Atrial fibrillation Aortic Valve Replacement  Plan Continue p.o. amiodarone for rate of and arrhythmia Agree with p.o. Lasix therapy consider switching to torsemide Aggressive heart failure therapy end-stage Supplemental oxygen for hypoxemia may need home O2 Inhalers as necessary Probably try to arrange home health as well as physical therapy With nephrology input for renal insufficiency Recommend PT/OT  Signed: Yolonda Kida MD 02/06/2021, 11:36 AM

## 2021-02-06 NOTE — Progress Notes (Signed)
Physical Therapy Treatment Patient Details Name: [name] MRN: [id number] DOB: [birth year] Today's Date: 02/06/2021    History of Present Illness Pt is an 87 y/o M with PMH: MGUS, chronic kidney disease stage IIIb, coronary disease status post CABG in the past, HFrEF, status post aortic valve replacement, hypertension and hyperlipidemia. Pt presented to ED d/t progressive worsening dyspnea and LE edema. Pt adm for CHF exacerbation. EF 15-20% on repeat echo.    PT Comments    Pt alert, eager to mobility. Overall the patient demonstrated great progress towards goals. Sit <> stand several times during session with RW and supervision. He was able to ambulate in several bouts; 169ft, 26ft, and additional 54ft to return to room with RW and supervision. On 2L via [place], spO2 >91%. The patient did exhibit some fatigue, PT led rest breaks with education on activity pacing/modification to improve safety. Returned to bed at pt request, all needs in reach. The patient would benefit from further skilled PT intervention to continue to maximize function and safety. Recommendation remains appropriate.        Follow Up Recommendations  Home health PT;Supervision for mobility/OOB;Supervision - Intermittent     Equipment Recommendations  None recommended by PT    Recommendations for Other Services       Precautions / Restrictions Precautions Precautions: Fall Restrictions Weight Bearing Restrictions: No    Mobility  Bed Mobility Overal bed mobility: Needs Assistance Bed Mobility: Sit to Supine       Sit to supine: Min guard;HOB elevated        Transfers Overall transfer level: Needs assistance Equipment used: Rolling walker (2 wheeled) Transfers: Sit to/from Stand Sit to Stand: Supervision;Min guard            Ambulation/Gait Ambulation/Gait assistance: Supervision Gait Distance (Feet):  (128ft, 66ft, additional 64ft to return to room) Assistive device: Rolling walker (2  wheeled)       General Gait Details: close chair follow, RW, some fatigue noted but no LOB   Stairs             Wheelchair Mobility    Modified Rankin (Stroke Patients Only)       Balance Overall balance assessment: Needs assistance   Sitting balance-Leahy Scale: Good     Standing balance support: Bilateral upper extremity supported Standing balance-Leahy Scale: Fair                              Cognition Arousal/Alertness: Awake/alert Behavior During Therapy: WFL for tasks assessed/performed Overall Cognitive Status: Within Functional Limits for tasks assessed                                        Exercises      General Comments        Pertinent Vitals/Pain Pain Assessment: Faces Faces Pain Scale: No hurt    Home Living                      Prior Function            PT Goals (current goals can now be found in the care plan section) Progress towards PT goals: Progressing toward goals    Frequency    Min 2X/week      PT Plan Current plan remains appropriate    Co-evaluation  AM-PAC PT "6 Clicks" Mobility   Outcome Measure  Help needed turning from your back to your side while in a flat bed without using bedrails?: A Little Help needed moving from lying on your back to sitting on the side of a flat bed without using bedrails?: A Little Help needed moving to and from a bed to a chair (including a wheelchair)?: A Little Help needed standing up from a chair using your arms (e.g., wheelchair or bedside chair)?: A Little Help needed to walk in hospital room?: A Little Help needed climbing 3-5 steps with a railing? : A Little 6 Click Score: 18    End of Session Equipment Utilized During Treatment: Gait belt Activity Tolerance: Patient tolerated treatment well Patient left: in bed;with call bell/phone within reach Nurse Communication: Mobility status PT Visit Diagnosis: Other  abnormalities of gait and mobility (R26.89);Muscle weakness (generalized) (M62.81);Difficulty in walking, not elsewhere classified (R26.2)     Time: 9485-4627 PT Time Calculation (min) (ACUTE ONLY): 26 min  Charges:  $Therapeutic Exercise: 23-37 mins                     Lieutenant Diego PT, DPT 3:50 PM,02/06/21

## 2021-02-07 ENCOUNTER — Inpatient Hospital Stay: Payer: PPO

## 2021-02-07 DIAGNOSIS — I5023 Acute on chronic systolic (congestive) heart failure: Secondary | ICD-10-CM | POA: Diagnosis not present

## 2021-02-07 LAB — CBC WITH DIFFERENTIAL/PLATELET
Abs Immature Granulocytes: 0.02 10*3/uL (ref 0.00–0.07)
Basophils Absolute: 0 10*3/uL (ref 0.0–0.1)
Basophils Relative: 0 %
Eosinophils Absolute: 0.3 10*3/uL (ref 0.0–0.5)
Eosinophils Relative: 4 %
HCT: 38 % — ABNORMAL LOW (ref 39.0–52.0)
Hemoglobin: 12.5 g/dL — ABNORMAL LOW (ref 13.0–17.0)
Immature Granulocytes: 0 %
Lymphocytes Relative: 11 %
Lymphs Abs: 0.9 10*3/uL (ref 0.7–4.0)
MCH: 30.1 pg (ref 26.0–34.0)
MCHC: 32.9 g/dL (ref 30.0–36.0)
MCV: 91.6 fL (ref 80.0–100.0)
Monocytes Absolute: 1 10*3/uL (ref 0.1–1.0)
Monocytes Relative: 12 %
Neutro Abs: 6.5 10*3/uL (ref 1.7–7.7)
Neutrophils Relative %: 73 %
Platelets: 138 10*3/uL — ABNORMAL LOW (ref 150–400)
RBC: 4.15 MIL/uL — ABNORMAL LOW (ref 4.22–5.81)
RDW: 14.9 % (ref 11.5–15.5)
WBC: 8.8 10*3/uL (ref 4.0–10.5)
nRBC: 0 % (ref 0.0–0.2)

## 2021-02-07 LAB — MAGNESIUM: Magnesium: 2.1 mg/dL (ref 1.7–2.4)

## 2021-02-07 MED ORDER — FUROSEMIDE 10 MG/ML IJ SOLN
40.0000 mg | Freq: Once | INTRAMUSCULAR | Status: AC
  Administered 2021-02-07: 40 mg via INTRAVENOUS
  Filled 2021-02-07: qty 4

## 2021-02-07 NOTE — Progress Notes (Signed)
PROGRESS NOTE    [name]  JAS:[id number] DOB: 1931/07/18 DOA: 01/31/2021 PCP: Baxter Hire, MD   Brief Narrative: 87 y.o. male with known history of MGUS, chronic kidney disease stage IIIb, coronary disease status post CABG in the past, HFrEF, status post aortic valve replacement, hypertension and hyperlipidemia.  Patient has had recurrent pleural effusion and is status post recent thoracentesis a few days ago.  He presents to the emergency room on account of progressive worsening dyspnea.  He also admitted to worsening lower extremity edema.  He admits compliance with his medication including diuretics with no significant improvement.  He has developed significant dyspnea with minimal exertion.  Seen by cardiology.  Remains symptomatic.  EF 15 to 20% on repeat echocardiogram.  BNP markedly elevated.  Minimal response to diuretics.  Case discussed with cardiology and nephrology.  Possible plan for Lasix GTT.  Lasix GTT initiated 5/19.  Patient endorse some symptomatic improvement.  Has converted to sinus rhythm.  Taken off amiodarone gtt. and converted to amiodarone 200 mg p.o. twice daily.  Kidney function continues to improve and patient continues to feel symptomatically better.  Lasix GTT stopped on 02/04/2021 and converted to p.o. torsemide 40 mg daily (home dose 20 mg torsemide)  5/23: Patient approaching baseline.  Endorses some fatigue and poor sleep but overall improvement  5/24: Patient continues to slowly improve.  Did endorse some shortness of breath and apparently small-volume hemoptysis this morning.  Pulmonary consulted.  Felt that hemoptysis was due to dry nasal passages in the setting of DVT prophylaxis use.  Hemodynamically stable.     Assessment & Plan:   Active Problems:   CHF exacerbation (HCC)   CHF (congestive heart failure), NYHA class IV, acute on chronic, systolic (HCC)   Unstageable pressure ulcer of sacral region [facility])  Acute hypoxic respiratory  failure Acute decompensated systolic congestive heart failure Cardiology following Difficulty optimizing volume status  EF 15 to 20% Start Lasix GTT on 5/19.  Appears to be improving.  Less short of breath Lasix drip stopped 5/21.  Started back on torsemide 40 mg daily Remains on 2 L nasal cannula, ordered for home use Plan: Continue torsemide 40 mg daily (home dose 20 mg daily) Recommend to continue torsemide 40 mg daily at time of discharge Wean oxygen as tolerated Home oxygen ordered  Tachyarrhythmia Suspected atrial fibrillation with aberrancy Cardiology following Started on amiodarone, now switched to p.o. Monitor electrolytes Continue amiodarone on discharge Follow-up outpatient cardiology  Acute kidney injury on chronic kidney disease stage IIIb Improving over interval.  Cardiorenal syndrome versus progression of underlying CKD.  Lasix GTT stopped.  Currently on torsemide 40 daily.  Continue to monitor daily BMP and electrolytes.  Bilateral pleural effusion S/p thoracentesis Cautious use of diuretics  Hypertension Diuretics as above  Anxiety PTA Xanax   DVT prophylaxis: SQ heparin Code Status: DNR Family Communication: Daughter [name] [phone] on 5/24 Disposition Plan: Status is: Inpatient  Remains inpatient appropriate because:Inpatient level of care appropriate due to severity of illness   Dispo: The patient is from: Home              Anticipated d/c is to: Home with home health              Patient currently is not medically stable to d/c.   Difficult to place patient No    Heart failure, fluid overload, dyspnea on exertion.  Improving over interval.  Anticipate medical readiness for discharge on 5/25.  Home health  services and home oxygen has been ordered.     Level of care: Progressive Cardiac  Consultants:   Cardiology  Nephrology  Procedures:   None  Antimicrobials:   None   Subjective: Patient seen and examined.  Breathing  improved.  Objective: Vitals:   02/07/21 0500 02/07/21 0733 02/07/21 1156 02/07/21 1539  BP:  109/78 110/66 101/73  Pulse:  72 85 70  Resp:  16 18 18   Temp:  97.9 F (36.6 C) 98 F (36.7 C) 98.1 F (36.7 C)  TempSrc:      SpO2:  99% 99% 99%  Weight: 72.5 kg     Height:        Intake/Output Summary (Last 24 hours) at 02/07/2021 1547 Last data filed at 02/07/2021 1539 Gross per 24 hour  Intake 960 ml  Output 2425 ml  Net -1465 ml   Filed Weights   02/06/21 0403 02/06/21 1000 02/07/21 0500  Weight: 70.8 kg 73.2 kg 72.5 kg    Examination:  General exam: Appears calm and comfortable  Respiratory system: Crackles at bases.  Normal work of breathing.  2 L  cardiovascular system: Regular rate and rhythm, no murmurs Gastrointestinal system: Abdomen is nondistended, soft and nontender. No organomegaly or masses felt. Normal bowel sounds heard. Central nervous system: Alert and oriented. No focal neurological deficits. Extremities: Symmetric 5 x 5 power. Skin: No rashes, lesions or ulcers Psychiatry: Judgement and insight appear normal. Mood & affect appropriate.     Data Reviewed: I have personally reviewed following labs and imaging studies  CBC: Recent Labs  Lab 02/03/21 0504 02/07/21 1240  WBC 13.3* 8.8  NEUTROABS  --  6.5  HGB 12.9* 12.5*  HCT 38.6* 38.0*  MCV 91.5 91.6  PLT 141* 315*   Basic Metabolic Panel: Recent Labs  Lab 02/01/21 0456 02/02/21 0452 02/03/21 0504 02/04/21 0457 02/05/21 0638 02/06/21 0504 02/06/21 1322 02/07/21 0433  NA 140 139 138 140 141  --  141  --   K 3.8 3.8 3.3* 3.5 3.1*  --  3.4*  --   CL 99 98 98 100 100  --  99  --   CO2 25 24 27 28 29   --  32  --   GLUCOSE 113* 112* 108* 109* 111*  --  139*  --   BUN 99* 113* 105* 93* 83*  --  72*  --   CREATININE 3.41* 3.67* 3.40* 3.12* 2.59*  --  2.49*  --   CALCIUM 8.9 8.7* 8.6* 8.2* 8.2*  --  8.3*  --   MG 3.2*  --   --  2.7* 2.5* 2.3  --  2.1   GFR: Estimated Creatinine  Clearance: 21.4 mL/min (A) (by C-G formula based on SCr of 2.49 mg/dL (H)). Liver Function Tests: Recent Labs  Lab 02/04/21 0457  AST 52*  ALT 77*   No results for input(s): LIPASE, AMYLASE in the last 168 hours. No results for input(s): AMMONIA in the last 168 hours. Coagulation Profile: No results for input(s): INR, PROTIME in the last 168 hours. Cardiac Enzymes: No results for input(s): CKTOTAL, CKMB, CKMBINDEX, TROPONINI in the last 168 hours. BNP (last 3 results) No results for input(s): PROBNP in the last 8760 hours. HbA1C: No results for input(s): HGBA1C in the last 72 hours. CBG: Recent Labs  Lab 02/01/21 1230 02/01/21 1638  GLUCAP 112* 149*   Lipid Profile: No results for input(s): CHOL, HDL, LDLCALC, TRIG, CHOLHDL, LDLDIRECT in the last 72 hours. Thyroid Function  Tests: No results for input(s): TSH, T4TOTAL, FREET4, T3FREE, THYROIDAB in the last 72 hours. Anemia Panel: No results for input(s): VITAMINB12, FOLATE, FERRITIN, TIBC, IRON, RETICCTPCT in the last 72 hours. Sepsis Labs: Recent Labs  Lab 02/02/21 0452  PROCALCITON 0.19    Recent Results (from the past 240 hour(s))  Resp Panel by RT-PCR (Flu A&B, Covid) Nasopharyngeal Swab     Status: None   Collection Time: 01/31/21 11:49 AM   Specimen: Nasopharyngeal Swab; Nasopharyngeal(NP) swabs in vial transport medium  Result Value Ref Range Status   SARS Coronavirus 2 by RT PCR NEGATIVE NEGATIVE Final    Comment: (NOTE) SARS-CoV-2 target nucleic acids are NOT DETECTED.  The SARS-CoV-2 RNA is generally detectable in upper respiratory specimens during the acute phase of infection. The lowest concentration of SARS-CoV-2 viral copies this assay can detect is 138 copies/mL. A negative result does not preclude SARS-Cov-2 infection and should not be used as the sole basis for treatment or other patient management decisions. A negative result may occur with  improper specimen collection/handling, submission of  specimen other than nasopharyngeal swab, presence of viral mutation(s) within the areas targeted by this assay, and inadequate number of viral copies(<138 copies/mL). A negative result must be combined with clinical observations, patient history, and epidemiological information. The expected result is Negative.  Fact Sheet for Patients:  EntrepreneurPulse.com.au  Fact Sheet for Healthcare Providers:  IncredibleEmployment.be  This test is no t yet approved or cleared by the [place] FDA and  has been authorized for detection and/or diagnosis of SARS-CoV-2 by FDA under an Emergency Use Authorization (EUA). This EUA will remain  in effect (meaning this test can be used) for the duration of the COVID-19 declaration under Section 564(b)(1) of the Act, 21 U.S.C.section 360bbb-3(b)(1), unless the authorization is terminated  or revoked sooner.       Influenza A by PCR NEGATIVE NEGATIVE Final   Influenza B by PCR NEGATIVE NEGATIVE Final    Comment: (NOTE) The Xpert Xpress SARS-CoV-2/FLU/RSV plus assay is intended as an aid in the diagnosis of influenza from Nasopharyngeal swab specimens and should not be used as a sole basis for treatment. Nasal washings and aspirates are unacceptable for Xpert Xpress SARS-CoV-2/FLU/RSV testing.  Fact Sheet for Patients: EntrepreneurPulse.com.au  Fact Sheet for Healthcare Providers: IncredibleEmployment.be  This test is not yet approved or cleared by the [place] FDA and has been authorized for detection and/or diagnosis of SARS-CoV-2 by FDA under an Emergency Use Authorization (EUA). This EUA will remain in effect (meaning this test can be used) for the duration of the COVID-19 declaration under Section 564(b)(1) of the Act, 21 U.S.C. section 360bbb-3(b)(1), unless the authorization is terminated or revoked.  Performed at [facility], [address], [place], [place] [address]          Radiology Studies: [facility] Chest [place] 1 View  Result Date: 02/07/2021 CLINICAL DATA:  Acute hypoxemic respiratory failure. EXAM: PORTABLE CHEST 1 VIEW COMPARISON:  Feb 05, 2021. FINDINGS: Stable cardiomegaly. No pneumothorax is noted. Status post aortic valve repair and coronary artery bypass graft. Stable bibasilar atelectasis or infiltrates are noted with small pleural effusions. Bony thorax is unremarkable. IMPRESSION: Stable bibasilar atelectasis or infiltrates are noted with small bilateral pleural effusions. Aortic Atherosclerosis (ICD10-I70.0). Electronically Signed   By: Marijo Conception M.D.   On: 02/07/2021 11:53        Scheduled Meds: . amiodarone  200 mg Oral BID  . aspirin EC  81 mg  Oral BID  . fluticasone  1 spray Each Nare Daily  . furosemide  40 mg Intravenous Once  . losartan  25 mg Oral Daily  . mirtazapine  15 mg Oral QHS  . sodium chloride flush  3 mL Intravenous Q12H  . torsemide  40 mg Oral Daily   Continuous Infusions: . sodium chloride 250 mL (02/03/21 0825)     LOS: 7 days    Time spent: 15 minutes    Sidney Ace, MD [facility] Pager [phone]  If 7PM-7AM, please contact night-coverage 02/07/2021, 3:47 PM

## 2021-02-07 NOTE — TOC Progression Note (Addendum)
Transition of Care [facility]) - Progression Note    Patient Details  Name: SAMER DUTTON MRN: [id number] Date of Birth: Apr 16, 1931  Transition [facility]) CM/SW [place], [place] Phone Number: 02/07/2021, 11:36 AM  Clinical Narrative:      Update 3:01 pm: CSW received request from MD to call patient's daughter [name] to inform of home health services patient agreed to which is [facility] PT. CSW called Pam, no answer lvm.        CSW met with patient at bedside, confirmed oxygen was delivered to room via [employer] and patient to take home. Patient in agreement with [place] services that has been set up, reports he believes he only needs Physical Therapy services at this time for home health. Is hopeful to regain his strength enough to resume outpatient PT services.   No other dc needs identified at this time.   Expected Discharge Plan: [place] Barriers to Discharge: No Barriers Identified  Expected Discharge Plan and Services Expected Discharge Plan: [place] arrangements for the past 2 months: Single Family Home                 DME Arranged: Oxygen DME Agency: [employer] Date DME Agency Contacted: 02/06/21 Time DME Agency Contacted: [id number] Representative spoke with at DME Agency: [place]: PT,OT,[facility] [place] Work [employer] Agency: [place]biologist ([place]) Date [place]: 02/06/21 Time [place]: [place] Representative spoke with at [place]: [place] ([place]) Interventions    Readmission Risk Interventions No flowsheet data found.

## 2021-02-07 NOTE — Progress Notes (Signed)
Occupational Therapy Treatment Patient Details Name: [name] MRN: [id number] DOB: [birth year] Today's Date: 02/07/2021    History of present illness Pt is an 87 y/o M with PMH: MGUS, chronic kidney disease stage IIIb, coronary disease status post CABG in the past, HFrEF, status post aortic valve replacement, hypertension and hyperlipidemia. Pt presented to ED d/t progressive worsening dyspnea and LE edema. Pt adm for CHF exacerbation. EF 15-20% on repeat echo.   OT comments  Pt seen for OT treatment on this date. Upon arrival to room, pt awake and seated upright in bed on 2L of O2 via . Pt agreeable to OT tx. Pt currently presents with decreased strength, balance, and activity tolerance, and requires MIN GUARD for bed mobility (with HOB elevated), MIN GUARD for seated LB dressing at EOB, and MIN A for standing UB dressing. Following dressing, pt verbalized that he wanted to walk in hallway and was able to walk 76ft with RW, requiring MIN A and mod verbal cues for RW management. Following return to bedroom, pt performed toilet transfer with MIN GUARD, sit>stand toilet hygiene with MIN A, and seated grooming tasks with set-up assistance. Pt educated  on activity pacing and rate of perceived exertion scale, with pt verbalizing understanding however requiring min verbal cues to implement energy conservation strategies during ADLs/functional mobility. Vitals stable throughout. Pt is making good progress toward goals and continues to benefit from skilled OT services to maximize return to PLOF and minimize risk of future falls, injury, caregiver burden, and readmission. Will continue to follow POC. Discharge recommendation remains appropriate.    Follow Up Recommendations  Home health OT;Supervision - Intermittent    Equipment Recommendations  3 in 1 bedside commode;Tub/shower seat       Precautions / Restrictions Precautions Precautions: Fall Restrictions Weight Bearing Restrictions: No        Mobility Bed Mobility Overal bed mobility: Needs Assistance Bed Mobility: Sit to Supine       Sit to supine: Min guard;HOB elevated   General bed mobility comments: increased time only. no physical assist    Transfers Overall transfer level: Needs assistance Equipment used: Rolling walker (2 wheeled) Transfers: Sit to/from Stand Sit to Stand: Min guard              Balance Overall balance assessment: Needs assistance Sitting-balance support: No upper extremity supported;Feet supported Sitting balance-Leahy Scale: Good Sitting balance - Comments: good sitting balance at EOB to don socks   Standing balance support: Single extremity supported;During functional activity Standing balance-Leahy Scale: Fair Standing balance comment: MIN A for steadying during sit>stand peri-care, with unilateral UE support from grab bar                           ADL either performed or assessed with clinical judgement   ADL Overall ADL's : Needs assistance/impaired     Grooming: Oral care;Supervision/safety;Set up;Sitting           Upper Body Dressing : Minimal assistance;Standing Upper Body Dressing Details (indicate cue type and reason): to don robe Lower Body Dressing: Min guard;Sitting/lateral leans Lower Body Dressing Details (indicate cue type and reason): to don socks Toilet Transfer: Min guard;Regular Toilet;Grab bars;RW   Toileting- Clothing Manipulation and Hygiene: Minimal assistance;Sit to/from stand Toileting - Clothing Manipulation Details (indicate cue type and reason): MIN A for steadying     Functional mobility during ADLs: Min guard;Minimal assistance;Rolling walker (to walk ~41ft)  Cognition Arousal/Alertness: Awake/alert Behavior During Therapy: WFL for tasks assessed/performed Overall Cognitive Status: Within Functional Limits for tasks assessed                                 General Comments: Pt  motivated to participate in therapy. Requires verbal cues for activity pacing        Exercises Other Exercises Other Exercises: Education on activity pacing and rate of perceived exertion scale, with pt verbalizing understanding        Frequency  Min 1X/week        Progress Toward Goals  OT Goals(current goals can now be found in the care plan section)  Progress towards OT goals: Progressing toward goals  Acute Rehab OT Goals Patient Stated Goal: to go home OT Goal Formulation: With patient Time For Goal Achievement: 02/15/21 Potential to Achieve Goals: Good  Plan Discharge plan remains appropriate;Frequency remains appropriate       AM-PAC OT "6 Clicks" Daily Activity     Outcome Measure   Help from another person eating meals?: None Help from another person taking care of personal grooming?: A Little Help from another person toileting, which includes using toliet, bedpan, or urinal?: A Little Help from another person bathing (including washing, rinsing, drying)?: A Little Help from another person to put on and taking off regular upper body clothing?: A Little Help from another person to put on and taking off regular lower body clothing?: A Little 6 Click Score: 19    End of Session Equipment Utilized During Treatment: Gait belt;Oxygen  OT Visit Diagnosis: Unsteadiness on feet (R26.81);Muscle weakness (generalized) (M62.81)   Activity Tolerance Patient tolerated treatment well   Patient Left in chair;with call bell/phone within reach;with chair alarm set   Nurse Communication Mobility status        Time: 1005-1050 OT Time Calculation (min): 45 min  Charges: OT General Charges $OT Visit: 1 Visit OT Treatments $Self Care/Home Management : 38-52 mins  Fredirick Maudlin, OTR/L [place]

## 2021-02-07 NOTE — Consult Note (Signed)
Pulmonary Medicine          Date: 02/07/2021,   MRN# [id number] [name] 05-09-1931     AdmissionWeight: 72.6 kg                 CurrentWeight: 72.5 kg  Referring physician: Dr. Priscella Mann    CHIEF COMPLAINT:   Non-- massive hemoptysis   HISTORY OF PRESENT ILLNESS   This is a pleasant 87 year old male with a history of chronic anemia, arthritis, CKD, diverticulosis, history of hiatal hernia with GERD, dyslipidemia, MGUS, essential hypertension, who came in after findings of recurrent pleural effusion status postthoracentesis.  He has been on chronic diuretic therapy and presented to pulmonology on outpatient with Dr. Raul Del with complaints of progressive dyspnea.  In the ER he was placed on BiPAP and found to have hypoxemia with pulmonary edema on chest x-ray.  He also had bilateral pleural effusions.  Initial BNP was severely elevated over 4500.  He had cardiology and nephrology evaluation and is being optimized medically for CHF and CKD.  He is developed nonmassive hemoptysis and PCCM was consulted for additional evaluation management.  He has been on outpatient Flonase due to nasal rhinitis.  He has been wearing nasal cannula with desiccation of naris and on physical exam has edema bilaterally with clotting noted passages bilaterally as well as left nare with redundant tissue and narrowing of passage with approximately 75% obstruction.  We discussed possibly needing outpatient ear nose and throat evaluation.  He has been started on heparin for DVT prophylaxis.  Oxygen has improved since evaluation with cardiology and nephrology.    PAST MEDICAL HISTORY   Past Medical History:  Diagnosis Date  . Anemia   . Aortic aneurysm ([place])   . Aortic aneurysm ([place])   . Arthritis   . CKD (chronic kidney disease)    ALSO LEFT RENAL MASS  . Coronary artery disease   . Diverticulosis    WITH RUPTURE  . GERD (gastroesophageal reflux disease)   . History of hiatal hernia   .  Hypercholesteremia   . Hypertension   . MGUS (monoclonal gammopathy of unknown significance)   . MGUS (monoclonal gammopathy of unknown significance)      SURGICAL HISTORY   Past Surgical History:  Procedure Laterality Date  . AORTIC VALVE REPLACEMENT    . bipass    . CARDIAC VALVE REPLACEMENT     pig valve  . CHOLECYSTECTOMY    . COLON SURGERY    . CORONARY ANGIOPLASTY    . CORONARY ARTERY BYPASS GRAFT    . ESOPHAGOGASTRODUODENOSCOPY (EGD) WITH PROPOFOL N/A 02/07/2015   Procedure: ESOPHAGOGASTRODUODENOSCOPY (EGD) WITH PROPOFOL;  Surgeon: Hulen Luster, MD;  Location: [facility] ENDOSCOPY;  Service: Gastroenterology;  Laterality: N/A;  . EYE SURGERY    . HERNIA REPAIR    . INTRAMEDULLARY (IM) NAIL INTERTROCHANTERIC Right 05/12/2020   Procedure: INTRAMEDULLARY (IM) NAIL INTERTROCHANTRIC;  Surgeon: Lovell Sheehan, MD;  Location: [facility] ORS;  Service: Orthopedics;  Laterality: Right;  . JOINT REPLACEMENT     left knee x4  . KNEE ARTHROPLASTY Right 04/02/2016   Procedure: COMPUTER ASSISTED TOTAL KNEE ARTHROPLASTY;  Surgeon: Dereck Leep, MD;  Location: [facility] ORS;  Service: Orthopedics;  Laterality: Right;  . ORIF FEMUR FRACTURE Right 08/08/2020   Procedure: OPEN REDUCTION INTERNAL FIXATION (ORIF) DISTAL FEMUR FRACTURE;  Surgeon: Lovell Sheehan, MD;  Location: [facility] ORS;  Service: Orthopedics;  Laterality: Right;     FAMILY HISTORY   Family  History  Problem Relation Age of Onset  . Bladder Cancer Neg Hx   . Prostate cancer Neg Hx   . Kidney cancer Neg Hx      SOCIAL HISTORY   Social History   Tobacco Use  . Smoking status: Former Research scientist (life sciences)  . Smokeless tobacco: Never Used  Substance Use Topics  . Alcohol use: No  . Drug use: No     MEDICATIONS    Home Medication:    Current Medication:  Current Facility-Administered Medications:  .  0.9 %  sodium chloride infusion, 250 mL, Intravenous, PRN, Acheampong, Warnell Bureau, MD, Last Rate: 10 mL/hr at 02/03/21 0825, 250 mL at 02/03/21  0825 .  acetaminophen (TYLENOL) tablet 650 mg, 650 mg, Oral, Q4H PRN, Artist Beach, MD, 650 mg at 02/03/21 0803 .  albuterol (VENTOLIN HFA) 108 (90 Base) MCG/ACT inhaler 2 puff, 2 puff, Inhalation, Q6H PRN, Acheampong, Warnell Bureau, MD .  ALPRAZolam Duanne Moron) tablet 0.25 mg, 0.25 mg, Oral, BID PRN, Acheampong, Warnell Bureau, MD .  amiodarone (PACERONE) tablet 200 mg, 200 mg, Oral, BID, Teodoro Spray, MD, 200 mg at 02/07/21 1002 .  aspirin EC tablet 81 mg, 81 mg, Oral, BID, Acheampong, Warnell Bureau, MD, 81 mg at 02/07/21 1002 .  fluticasone (FLONASE) 50 MCG/ACT nasal spray 1 spray, 1 spray, Each Nare, Daily, Acheampong, Warnell Bureau, MD, 1 spray at 02/07/21 1003 .  furosemide (LASIX) injection 40 mg, 40 mg, Intravenous, Once, Sreenath, Sudheer B, MD .  heparin injection 5,000 Units, 5,000 Units, Subcutaneous, Q8H, Acheampong, Warnell Bureau, MD, 5,000 Units at 02/07/21 [phone] .  losartan (COZAAR) tablet 25 mg, 25 mg, Oral, Daily, Fath, Javier Docker, MD, 25 mg at 02/07/21 1001 .  melatonin tablet 2.5 mg, 2.5 mg, Oral, QHS PRN, Acheampong, Warnell Bureau, MD, 2.5 mg at 02/05/21 2135 .  menthol-cetylpyridinium (CEPACOL) lozenge 3 mg, 1 lozenge, Oral, PRN, Priscella Mann, Sudheer B, MD, 3 mg at 02/02/21 2228 .  mirtazapine (REMERON) tablet 15 mg, 15 mg, Oral, QHS, Sreenath, Sudheer B, MD, 15 mg at 02/06/21 2141 .  ondansetron (ZOFRAN) injection 4 mg, 4 mg, Intravenous, Q6H PRN, Acheampong, Warnell Bureau, MD .  sodium chloride flush (NS) 0.9 % injection 3 mL, 3 mL, Intravenous, Q12H, Acheampong, Warnell Bureau, MD, 3 mL at 02/07/21 1003 .  sodium chloride flush (NS) 0.9 % injection 3 mL, 3 mL, Intravenous, PRN, Acheampong, Warnell Bureau, MD .  torsemide (DEMADEX) tablet 40 mg, 40 mg, Oral, Daily, Bhutani, Manpreet S, MD, 40 mg at 02/07/21 1002    ALLERGIES   Morphine, Oxycodone, Carvedilol, Iodinated diagnostic agents, and Lisinopril     REVIEW OF SYSTEMS    Review of Systems:  Gen:  Denies  fever, sweats, chills weigh loss  HEENT: Denies blurred  vision, double vision, ear pain, eye pain, hearing loss, nose bleeds, sore throat Cardiac:  No dizziness, chest pain or heaviness, chest tightness,edema Resp:   Denies cough or sputum porduction, shortness of breath,wheezing, hemoptysis,  Gi: Denies swallowing difficulty, stomach pain, nausea or vomiting, diarrhea, constipation, bowel incontinence Gu:  Denies bladder incontinence, burning urine Ext:   Denies Joint pain, stiffness or swelling Skin: Denies  skin rash, easy bruising or bleeding or hives Endoc:  Denies polyuria, polydipsia , polyphagia or weight change Psych:   Denies depression, insomnia or hallucinations   Other:  All other systems negative   VS: BP 110/66 (BP Location: Right Arm)   Pulse 85   Temp 98 F (36.7 C)   Resp 18  Ht 5\' 11"  (1.803 m)   Wt 72.5 kg   SpO2 99%   BMI 22.29 kg/m      PHYSICAL EXAM    GENERAL:NAD, no fevers, chills, no weakness no fatigue HEAD: Normocephalic, atraumatic.  EYES: Pupils equal, round, reactive to light. Extraocular muscles intact. No scleral icterus.  MOUTH: Moist mucosal membrane. Dentition intact. No abscess noted.  EAR, NOSE, THROAT: Clear without exudates. No external lesions.  NECK: Supple. No thyromegaly. No nodules. No JVD.  PULMONARY: Mild crackles bilaterally worse posteriorly at the bases CARDIOVASCULAR: S1 and S2. Regular rate and rhythm. No murmurs, rubs, or gallops. No edema. Pedal pulses 2+ bilaterally.  GASTROINTESTINAL: Soft, nontender, nondistended. No masses. Positive bowel sounds. No hepatosplenomegaly.  MUSCULOSKELETAL: No swelling, clubbing, or edema. Range of motion full in all extremities.  NEUROLOGIC: Cranial nerves II through XII are intact. No gross focal neurological deficits. Sensation intact. Reflexes intact.  SKIN: No ulceration, lesions, rashes, or cyanosis. Skin warm and dry. Turgor intact.  PSYCHIATRIC: Mood, affect within normal limits. The patient is awake, alert and oriented x 3. Insight,  judgment intact.       IMAGING    DG Chest 1 View  Result Date: 01/27/2021 CLINICAL DATA:  87 year old male with history of CHF status post left thoracentesis. EXAM: CHEST  1 VIEW COMPARISON:  01/25/2021 FINDINGS: Stable cardiomediastinal silhouette with unchanged moderate cardiomegaly. Persistent but decreased blunting of the right costophrenic angle. Persistent passive in cicatricial atelectasis in the right lower lobe. Similar appearing left costophrenic angle blunting and mild retrocardiac opacities. No evidence pneumothorax. No new focal consolidations. Atherosclerotic calcification of the aortic arch. Median sternotomy wires remain in place and appear intact in addition to postsurgical changes after coronary artery bypass and aortic valve replacement. No acute osseous abnormality. IMPRESSION: Trace residual right pleural effusion after right thoracentesis. No evidence of pneumothorax. Electronically Signed   By: Ruthann Cancer MD   On: 01/27/2021 09:37   DG Chest Port 1 View  Result Date: 02/07/2021 CLINICAL DATA:  Acute hypoxemic respiratory failure. EXAM: PORTABLE CHEST 1 VIEW COMPARISON:  Feb 05, 2021. FINDINGS: Stable cardiomegaly. No pneumothorax is noted. Status post aortic valve repair and coronary artery bypass graft. Stable bibasilar atelectasis or infiltrates are noted with small pleural effusions. Bony thorax is unremarkable. IMPRESSION: Stable bibasilar atelectasis or infiltrates are noted with small bilateral pleural effusions. Aortic Atherosclerosis (ICD10-I70.0). Electronically Signed   By: Marijo Conception M.D.   On: 02/07/2021 11:53   DG Chest Port 1 View  Result Date: 02/05/2021 CLINICAL DATA:  Hemoptysis and shortness of breath. EXAM: PORTABLE CHEST 1 VIEW COMPARISON:  02/02/2021 and prior radiographs FINDINGS: Cardiomegaly, aortic valve replacement and mild pulmonary vascular congestion noted. Bilateral LOWER lung opacities/atelectasis and bilateral pleural effusions are  again identified, slightly increased on the RIGHT. There is no evidence of pneumothorax. No other changes noted. IMPRESSION: 1. Bilateral LOWER lung opacities/atelectasis and bilateral pleural effusions, slightly increased on the RIGHT. 2. Cardiomegaly with mild pulmonary vascular congestion. Electronically Signed   By: Margarette Canada M.D.   On: 02/05/2021 16:02   DG Chest Port 1 View  Result Date: 02/02/2021 CLINICAL DATA:  Shortness of breath EXAM: PORTABLE CHEST 1 VIEW COMPARISON:  Jan 31, 2021 FINDINGS: There is consolidation in a portion of the left base. There is a partially loculated pleural effusion on the left. There is a smaller partially loculated pleural effusion on the right. There is cardiomegaly with pulmonary vascularity within normal limits. Patient is status post coronary  artery bypass grafting and aortic valve replacement. No adenopathy. There is aortic atherosclerosis. No bone lesions. IMPRESSION: Airspace opacity concerning for pneumonia with atelectasis left lower lobe. Loculated pleural effusion on each side, slightly larger on the left the right. Stable cardiomegaly with postoperative changes. Aortic Atherosclerosis (ICD10-I70.0). Electronically Signed   By: Lowella Grip III M.D.   On: 02/02/2021 08:15   DG Chest Port 1 View  Result Date: 01/31/2021 CLINICAL DATA:  Shortness of breath. EXAM: PORTABLE CHEST 1 VIEW COMPARISON:  Chest x-ray dated Jan 27, 2021. FINDINGS: Stable cardiomegaly status post CABG and AVR. Unchanged pulmonary vascular congestion and interstitial thickening. Unchanged small bilateral pleural effusions and bibasilar atelectasis. No pneumothorax. No acute osseous abnormality. IMPRESSION: 1. Unchanged congestive heart failure. Electronically Signed   By: Titus Dubin M.D.   On: 01/31/2021 11:47   DG Chest Port 1 View  Result Date: 01/25/2021 CLINICAL DATA:  CHF, status post left thoracentesis EXAM: PORTABLE CHEST 1 VIEW COMPARISON:  09/09/2020 FINDINGS:  Improvement in the left effusion following thoracentesis. No pneumothorax. Small right effusion noted. Heart is enlarged with vascular congestion and basilar atelectasis versus scarring. Aorta atherosclerotic. Remote coronary bypass changes and cardiac valve replacement. Degenerative changes of the spine. Bones are osteopenic. IMPRESSION: No pneumothorax following left thoracentesis. Electronically Signed   By: Jerilynn Mages.  Shick M.D.   On: 01/25/2021 14:06   ECHOCARDIOGRAM COMPLETE  Result Date: 02/01/2021    ECHOCARDIOGRAM REPORT   Patient Name:   [name] Date of Exam: 01/31/2021 Medical Rec #:  [id number]       Height:       71.0 in Accession #:    [id number]      Weight:       160.0 lb Date of Birth:  02/12/1931       BSA:          1.918 m Patient Age:    57 years        BP:           122/101 mmHg Patient Gender: M               HR:           106 bpm. Exam Location:  [facility] Procedure: 2D Echo, Cardiac Doppler and Color Doppler Indications:     R06.03 Acute Respiratory distress  History:         Patient has no prior history of Echocardiogram examinations.                  Aortic Valve replacement; Risk Factors:Dyslipidemia, Former                  Smoker and Hypertension. Coronary artery disease. Chronic                  kidney disease.  Sonographer:     Wilford Sports Rodgers-Jones Referring Phys:  [id number] Artist Beach Diagnosing Phys: Bartholome Bill MD IMPRESSIONS  1. Left ventricular ejection fraction, by estimation, is <20%. The left ventricle has severely decreased function. The left ventricle has no regional wall motion abnormalities. The left ventricular internal cavity size was mildly dilated. Left ventricular diastolic parameters were normal.  2. Right ventricular systolic function is normal. The right ventricular size is mildly enlarged.  3. Left atrial size was mildly dilated.  4. Right atrial size was mildly dilated.  5. The mitral valve is grossly normal. Mild to moderate mitral valve regurgitation.  6.  Tricuspid valve regurgitation is moderate.  7. The aortic valve is grossly normal. Aortic valve regurgitation is trivial. Mild aortic valve stenosis.  8. Aortic dilatation noted. FINDINGS  Left Ventricle: Left ventricular ejection fraction, by estimation, is <20%. The left ventricle has severely decreased function. The left ventricle has no regional wall motion abnormalities. The left ventricular internal cavity size was mildly dilated. There is no left ventricular hypertrophy. Left ventricular diastolic parameters were normal. Right Ventricle: The right ventricular size is mildly enlarged. Right vetricular wall thickness was not well visualized. Right ventricular systolic function is normal. Left Atrium: Left atrial size was mildly dilated. Right Atrium: Right atrial size was mildly dilated. Pericardium: There is no evidence of pericardial effusion. Mitral Valve: The mitral valve is grossly normal. Mild to moderate mitral valve regurgitation. Tricuspid Valve: The tricuspid valve is not well visualized. Tricuspid valve regurgitation is moderate. Aortic Valve: The aortic valve is grossly normal. Aortic valve regurgitation is trivial. Mild aortic stenosis is present. Aortic valve mean gradient measures 8.2 mmHg. Aortic valve peak gradient measures 12.9 mmHg. Aortic valve area, by VTI measures 0.77  cm. Pulmonic Valve: The pulmonic valve was not well visualized. Pulmonic valve regurgitation is mild. Aorta: Aortic dilatation noted. IAS/Shunts: The atrial septum is grossly normal.  LEFT VENTRICLE PLAX 2D LVIDd:         5.88 cm LVIDs:         5.57 cm LV PW:         0.79 cm LV IVS:        0.85 cm LVOT diam:     2.10 cm LV SV:         20 LV SV Index:   10 LVOT Area:     3.46 cm  RIGHT VENTRICLE            IVC RV Basal diam:  5.06 cm    IVC diam: 2.42 cm RV S prime:     7.62 cm/s TAPSE (M-mode): 0.6 cm LEFT ATRIUM             Index       RIGHT ATRIUM           Index LA diam:        4.80 cm 2.50 cm/m  RA Area:     27.90  cm LA Vol (A2C):   98.4 ml 51.31 ml/m RA Volume:   108.00 ml 56.31 ml/m LA Vol (A4C):   82.8 ml 43.17 ml/m LA Biplane Vol: 93.1 ml 48.55 ml/m  AORTIC VALVE AV Area (Vmax):    0.73 cm AV Area (Vmean):   0.72 cm AV Area (VTI):     0.77 cm AV Vmax:           179.25 cm/s AV Vmean:          133.750 cm/s AV VTI:            0.256 m AV Peak Grad:      12.9 mmHg AV Mean Grad:      8.2 mmHg LVOT Vmax:         37.93 cm/s LVOT Vmean:        27.833 cm/s LVOT VTI:          0.057 m LVOT/AV VTI ratio: 0.22  AORTA Ao Root diam: 4.10 cm MV E velocity: 65.00 cm/s  TRICUSPID VALVE MV A velocity: 35.90 cm/s  TR Peak grad:   30.7 mmHg MV E/A ratio:  1.81        TR Vmax:  277.00 cm/s                             SHUNTS                            Systemic VTI:  0.06 m                            Systemic Diam: 2.10 cm Bartholome Bill MD Electronically signed by Bartholome Bill MD Signature Date/Time: 02/01/2021/6:58:29 AM    Final    US THORACENTESIS ASP PLEURAL SPACE W/IMG GUIDE  Result Date: 01/27/2021 INDICATION: Patient with a history of heart failure and recurrent bilateral pleural effusions. Interventional radiology asked to perform a therapeutic and diagnostic thoracentesis. EXAM: ULTRASOUND GUIDED THORACENTESIS MEDICATIONS: % lidocaine 10 mL COMPLICATIONS: None immediate. PROCEDURE: An ultrasound guided thoracentesis was thoroughly discussed with the patient and questions answered. The benefits, risks, alternatives and complications were also discussed. The patient understands and wishes to proceed with the procedure. Written consent was obtained. Ultrasound was performed to localize and mark an adequate pocket of fluid in the right chest. The area was then prepped and draped in the normal sterile fashion. 1% Lidocaine was used for local anesthesia. Under ultrasound guidance a 6 Fr Safe-T-Centesis catheter was introduced. Thoracentesis was performed. The catheter was removed and a dressing applied. FINDINGS: A total of  approximately 400 mL of amber-colored fluid was removed. Samples were sent to the laboratory as requested by the clinical team. IMPRESSION: Successful ultrasound guided right thoracentesis yielding 400 mL of pleural fluid. Read by: Soyla Dryer, NP Electronically Signed   By: Ruthann Cancer MD   On: 01/27/2021 09:41   US THORACENTESIS ASP PLEURAL SPACE W/IMG GUIDE  Result Date: 01/25/2021 INDICATION: CHF, pleural effusions EXAM: ULTRASOUND GUIDED LEFT THORACENTESIS MEDICATIONS: 1% lidocaine local COMPLICATIONS: None immediate. PROCEDURE: An ultrasound guided thoracentesis was thoroughly discussed with the patient and questions answered. The benefits, risks, alternatives and complications were also discussed. The patient understands and wishes to proceed with the procedure. Written consent was obtained. Ultrasound was performed to localize and mark an adequate pocket of fluid in the left chest. The area was then prepped and draped in the normal sterile fashion. 1% Lidocaine was used for local anesthesia. Under ultrasound guidance a 6 Fr Safe-T-Centesis catheter was introduced. Thoracentesis was performed. The catheter was removed and a dressing applied. FINDINGS: A total of approximately 500 cc of blood tinged pleural fluid was removed. Samples were sent to the laboratory as requested by the clinical team. IMPRESSION: Successful ultrasound guided left thoracentesis yielding 500 cc of pleural fluid. Electronically Signed   By: Jerilynn Mages.  Shick M.D.   On: 01/25/2021 14:04      ASSESSMENT/PLAN   Nonmassive hemoptysis -Etiology at this point seems to be due to desiccation of nasal mucosa with oxygen therapy with background of subcu heparin and as needed Flonase intranasally -There are dried blood clots bilaterally in nares with left nare showing 75% obstruction with redundant tissue as well as mucosal edema-recommend ENT evaluation post discharge -will hold subcu heparin for now and add SCDs -will Switch oxygen  therapy to facemask interface  Bilateral pleural effusions -That is post thoracentesis with lymphocyte predominant simple transudate of effusion likely due to CKD with concomitant CHF -Agree with  diuresis  Acute on chronic systolic CHF with EF 15 to 20% and  global hypokinesis. -Cardiology on case -appreciate recommendations and input   Chronic kidney disease -Attributing to pulmonary edema and effusions -Nephrology on case-appreciate input  Thank you for allowing me to participate in the care of this patient.   Patient/Family are satisfied with care plan and all questions have been answered.  This document was prepared using Dragon voice recognition software and may include unintentional dictation errors.     Ottie Glazier, M.D.  Division of [place]

## 2021-02-07 NOTE — Progress Notes (Signed)
[facility]  ROUNDING NOTE   Subjective:   [name] is a 87 y.o. male with past medical history of CAD with CABG, hypertension, hyperlipidemia, aortic valve replacement, heart failure with reduced EF, and CKD stage 3B. He presents to the ED with shortness of breath that has progressed for a while.   He is a current patient of [facility], seen by Dr Holley Raring. He states he missed his appointment earlier this week.   Patient seen laying in bed States he feels better today Completed breakfast tray at bedside States he was able to ambulate and sit in chair yesterday.  Reports some shortness of breath with those activities    Objective:  Vital signs in last 24 hours:  Temp:  [97.5 F (36.4 C)-98.2 F (36.8 C)] 97.9 F (36.6 C) (05/24 0733) Pulse Rate:  [72-86] 72 (05/24 0733) Resp:  [16-18] 16 (05/24 0733) BP: (100-109)/(75-78) 109/78 (05/24 0733) SpO2:  [96 %-99 %] 99 % (05/24 0733) Weight:  [72.5 kg] 72.5 kg (05/24 0500)  Weight change: 2.449 kg Filed Weights   02/06/21 0403 02/06/21 1000 02/07/21 0500  Weight: 70.8 kg 73.2 kg 72.5 kg    Intake/Output: I/O last 3 completed shifts: In: 1320 [P.O.:1320] Out: 2350 [Urine:2350]   Intake/Output this shift:  Total I/O In: 240 [P.O.:240] Out: 475 [Urine:475]  Physical Exam: General: NAD, laying in bed  Head: Normocephalic, atraumatic. Moist oral mucosal membranes  Eyes: Anicteric  Lungs:  Crackles in bases, 2L [place]  Heart: Regular rate and rhythm  Abdomen:  Soft, nontender,   Extremities:  trace peripheral edema LLE.  Neurologic: Alert, moving all four extremities  Skin: No lesions       Basic Metabolic Panel: Recent Labs  Lab 02/01/21 0456 02/02/21 0452 02/03/21 0504 02/04/21 0457 02/05/21 0638 02/06/21 0504 02/06/21 1322 02/07/21 0433  NA 140 139 138 140 141  --  141  --   K 3.8 3.8 3.3* 3.5 3.1*  --  3.4*  --   CL 99 98 98 100 100  --  99  --   CO2 25 24 27 28 29   --  32  --   GLUCOSE 113*  112* 108* 109* 111*  --  139*  --   BUN 99* 113* 105* 93* 83*  --  72*  --   CREATININE 3.41* 3.67* 3.40* 3.12* 2.59*  --  2.49*  --   CALCIUM 8.9 8.7* 8.6* 8.2* 8.2*  --  8.3*  --   MG 3.2*  --   --  2.7* 2.5* 2.3  --  2.1    Liver Function Tests: Recent Labs  Lab 01/31/21 1109 02/04/21 0457  AST 92* 52*  ALT 77* 77*  ALKPHOS 137*  --   BILITOT 3.0*  --   PROT 6.9  --   ALBUMIN 3.7  --    No results for input(s): LIPASE, AMYLASE in the last 168 hours. No results for input(s): AMMONIA in the last 168 hours.  CBC: Recent Labs  Lab 01/31/21 1109 02/03/21 0504  WBC 7.7 13.3*  NEUTROABS 5.7  --   HGB 14.6 12.9*  HCT 42.2 38.6*  MCV 91.5 91.5  PLT 210 141*    Cardiac Enzymes: No results for input(s): CKTOTAL, CKMB, CKMBINDEX, TROPONINI in the last 168 hours.  BNP: Invalid input(s): POCBNP  CBG: Recent Labs  Lab 02/01/21 1230 02/01/21 1638  GLUCAP 112* 149*    Microbiology: Results for orders placed or performed during the hospital encounter of  01/31/21  Resp Panel by RT-PCR (Flu A&B, Covid) Nasopharyngeal Swab     Status: None   Collection Time: 01/31/21 11:49 AM   Specimen: Nasopharyngeal Swab; Nasopharyngeal(NP) swabs in vial transport medium  Result Value Ref Range Status   SARS Coronavirus 2 by RT PCR NEGATIVE NEGATIVE Final    Comment: (NOTE) SARS-CoV-2 target nucleic acids are NOT DETECTED.  The SARS-CoV-2 RNA is generally detectable in upper respiratory specimens during the acute phase of infection. The lowest concentration of SARS-CoV-2 viral copies this assay can detect is 138 copies/mL. A negative result does not preclude SARS-Cov-2 infection and should not be used as the sole basis for treatment or other patient management decisions. A negative result may occur with  improper specimen collection/handling, submission of specimen other than nasopharyngeal swab, presence of viral mutation(s) within the areas targeted by this assay, and inadequate  number of viral copies(<138 copies/mL). A negative result must be combined with clinical observations, patient history, and epidemiological information. The expected result is Negative.  Fact Sheet for Patients:  EntrepreneurPulse.com.au  Fact Sheet for Healthcare Providers:  IncredibleEmployment.be  This test is no t yet approved or cleared by the [place] FDA and  has been authorized for detection and/or diagnosis of SARS-CoV-2 by FDA under an Emergency Use Authorization (EUA). This EUA will remain  in effect (meaning this test can be used) for the duration of the COVID-19 declaration under Section 564(b)(1) of the Act, 21 U.S.C.section 360bbb-3(b)(1), unless the authorization is terminated  or revoked sooner.       Influenza A by PCR NEGATIVE NEGATIVE Final   Influenza B by PCR NEGATIVE NEGATIVE Final    Comment: (NOTE) The Xpert Xpress SARS-CoV-2/FLU/RSV plus assay is intended as an aid in the diagnosis of influenza from Nasopharyngeal swab specimens and should not be used as a sole basis for treatment. Nasal washings and aspirates are unacceptable for Xpert Xpress SARS-CoV-2/FLU/RSV testing.  Fact Sheet for Patients: EntrepreneurPulse.com.au  Fact Sheet for Healthcare Providers: IncredibleEmployment.be  This test is not yet approved or cleared by the [place] FDA and has been authorized for detection and/or diagnosis of SARS-CoV-2 by FDA under an Emergency Use Authorization (EUA). This EUA will remain in effect (meaning this test can be used) for the duration of the COVID-19 declaration under Section 564(b)(1) of the Act, 21 U.S.C. section 360bbb-3(b)(1), unless the authorization is terminated or revoked.  Performed at [facility], [address] [place], [place] [address]     Coagulation Studies: No results for input(s): LABPROT, INR in the last 72  hours.  Urinalysis: No results for input(s): COLORURINE, LABSPEC, PHURINE, GLUCOSEU, HGBUR, BILIRUBINUR, KETONESUR, PROTEINUR, UROBILINOGEN, NITRITE, LEUKOCYTESUR in the last 72 hours.  Invalid input(s): APPERANCEUR    Imaging: DG Chest Port 1 View  Result Date: 02/05/2021 CLINICAL DATA:  Hemoptysis and shortness of breath. EXAM: PORTABLE CHEST 1 VIEW COMPARISON:  02/02/2021 and prior radiographs FINDINGS: Cardiomegaly, aortic valve replacement and mild pulmonary vascular congestion noted. Bilateral LOWER lung opacities/atelectasis and bilateral pleural effusions are again identified, slightly increased on the RIGHT. There is no evidence of pneumothorax. No other changes noted. IMPRESSION: 1. Bilateral LOWER lung opacities/atelectasis and bilateral pleural effusions, slightly increased on the RIGHT. 2. Cardiomegaly with mild pulmonary vascular congestion. Electronically Signed   By: Margarette Canada M.D.   On: 02/05/2021 16:02     Medications:   . sodium chloride 250 mL (02/03/21 0825)   . amiodarone  200 mg Oral BID  . aspirin EC  81 mg Oral BID  . fluticasone  1 spray Each Nare Daily  . heparin  5,000 Units Subcutaneous Q8H  . losartan  25 mg Oral Daily  . mirtazapine  15 mg Oral QHS  . sodium chloride flush  3 mL Intravenous Q12H  . torsemide  40 mg Oral Daily   sodium chloride, acetaminophen, albuterol, ALPRAZolam, melatonin, menthol-cetylpyridinium, ondansetron (ZOFRAN) IV, sodium chloride flush  Assessment/ Plan:  [name] is a 87 y.o.  male with past medical history of CAD with CABG, hypertension, hyperlipidemia, aortic valve replacement, heart failure with reduced EF, and CKD. He presents to the ED with shortness of breath that has progressed for a while.  # Acute Kidney Injury on chronic kidney disease stage 4  with baseline creatinine 2.2 and GFR of 28 on 01/04/21. Marland Kitchen Acute kidney injury complicated by cardiorenal syndrome Acute kidney injury secondary to fluid  overload Transitioned to po diuretics over weekend Creatinine improved UOP 1.5L Will continue to monitor progress Cleared to discharge from renal stance with follow up with our office  #Acute on  Chronic systolic CHF,  Patient admitted with with Volume overload and acute pulmonary edema.  Patient 2D echo shows left ventricular ejection fraction, by estimation, is <20%.    # Anemia of chronic kidney disease Lab Results  Component Value Date   HGB 12.9 (L) 02/03/2021  Hgb within range Will monitor  # Hypotension   Patient blood pressure is on lower side, but stable for him Likely secondary to IV diuresis Patient is on losartan as well. We will continue the current medication.  #  Hypokalemia Secondary to diuretics Will monitor and treat if needed     LOS: 7 Audyn Dimercurio 5/24/202210:26 AM

## 2021-02-08 DIAGNOSIS — I5023 Acute on chronic systolic (congestive) heart failure: Secondary | ICD-10-CM | POA: Diagnosis not present

## 2021-02-08 LAB — BASIC METABOLIC PANEL
Anion gap: 10 (ref 5–15)
BUN: 68 mg/dL — ABNORMAL HIGH (ref 8–23)
CO2: 29 mmol/L (ref 22–32)
Calcium: 8.2 mg/dL — ABNORMAL LOW (ref 8.9–10.3)
Chloride: 98 mmol/L (ref 98–111)
Creatinine, Ser: 2.24 mg/dL — ABNORMAL HIGH (ref 0.61–1.24)
GFR, Estimated: 28 mL/min — ABNORMAL LOW (ref >60)
Glucose, Bld: 157 mg/dL — ABNORMAL HIGH (ref 70–99)
Potassium: 3.2 mmol/L — ABNORMAL LOW (ref 3.5–5.1)
Sodium: 137 mmol/L (ref 135–145)

## 2021-02-08 LAB — MAGNESIUM: Magnesium: 2.3 mg/dL (ref 1.7–2.4)

## 2021-02-08 LAB — CBC
HCT: 37.1 % — ABNORMAL LOW (ref 39.0–52.0)
Hemoglobin: 12.3 g/dL — ABNORMAL LOW (ref 13.0–17.0)
MCH: 30.5 pg (ref 26.0–34.0)
MCHC: 33.2 g/dL (ref 30.0–36.0)
MCV: 92.1 fL (ref 80.0–100.0)
Platelets: 134 K/uL — ABNORMAL LOW (ref 150–400)
RBC: 4.03 MIL/uL — ABNORMAL LOW (ref 4.22–5.81)
RDW: 14.7 % (ref 11.5–15.5)
WBC: 6.5 K/uL (ref 4.0–10.5)
nRBC: 0 % (ref 0.0–0.2)

## 2021-02-08 LAB — URIC ACID: Uric Acid, Serum: 9.1 mg/dL — ABNORMAL HIGH (ref 3.7–8.6)

## 2021-02-08 MED ORDER — POTASSIUM CHLORIDE CRYS ER 20 MEQ PO TBCR
40.0000 meq | EXTENDED_RELEASE_TABLET | Freq: Once | ORAL | Status: AC
  Administered 2021-02-08: 40 meq via ORAL
  Filled 2021-02-08: qty 2

## 2021-02-08 MED ORDER — POTASSIUM CHLORIDE CRYS ER 20 MEQ PO TBCR: 40.0000 meq | EXTENDED_RELEASE_TABLET | Freq: Every day | ORAL | 1 refills | Status: DC

## 2021-02-08 MED ORDER — AMIODARONE HCL 200 MG PO TABS: 200.0000 mg | ORAL_TABLET | Freq: Two times a day (BID) | ORAL | 1 refills | Status: DC

## 2021-02-08 MED ORDER — LOSARTAN POTASSIUM 25 MG PO TABS: 25.0000 mg | ORAL_TABLET | Freq: Every day | ORAL | 1 refills | Status: DC

## 2021-02-08 NOTE — Progress Notes (Signed)
   02/08/21 1335  Clinical Encounter Type  Visited With Patient  Visit Type Spiritual support;Social support Firefighter)  Referral From Chaplain  Consult/Referral To Chaplain  Spiritual Encounters  Spiritual Needs  (Prayer)  Chaplain Burris returned to [employer] after realizing it was his birthday. This prompted some additional conversation about home and family. Chaplain Burris shared prayer with [name] and wished him improved strength and comfort upon returning home.

## 2021-02-08 NOTE — Progress Notes (Signed)
[facility]  ROUNDING NOTE   Subjective:   [name] is a 87 y.o. male with past medical history of CAD with CABG, hypertension, hyperlipidemia, aortic valve replacement, heart failure with reduced EF, and CKD stage 3B. He presents to the ED with shortness of breath that has progressed for a while.   He is a current patient of [facility], seen by Dr Holley Raring. He states he missed his appointment earlier this week.   Patient seen laying in bed Alert and oriented Appears better today Tolerating meals, denies nausea Remains on 2L [place] Ambulated with therapy yesterday    Objective:  Vital signs in last 24 hours:  Temp:  [97.6 F (36.4 C)-98.2 F (36.8 C)] 97.6 F (36.4 C) (05/25 0956) Pulse Rate:  [69-85] 74 (05/25 0956) Resp:  [16-21] 18 (05/25 0959) BP: (95-112)/(59-73) 95/64 (05/25 0956) SpO2:  [99 %-100 %] 100 % (05/25 0956) Weight:  [73.5 kg-74.4 kg] 73.5 kg (05/25 0956)  Weight change: 1.19 kg Filed Weights   02/07/21 0500 02/08/21 0352 02/08/21 0956  Weight: 72.5 kg 74.4 kg 73.5 kg    Intake/Output: I/O last 3 completed shifts: In: 723 [P.O.:720; I.V.:3] Out: 3075 [Urine:3075]   Intake/Output this shift:  Total I/O In: -  Out: 775 [Urine:775]  Physical Exam: General: NAD, laying in bed  Head: Normocephalic, atraumatic. Moist oral mucosal membranes  Eyes: Anicteric  Lungs:  Diminished on Right, 2L [place]  Heart: Regular rate and rhythm  Abdomen:  Soft, nontender,   Extremities:  trace peripheral edema LLE.  Neurologic: Alert, moving all four extremities  Skin: No lesions       Basic Metabolic Panel: Recent Labs  Lab 02/03/21 0504 02/04/21 0457 02/05/21 3536 02/06/21 0504 02/06/21 1322 02/07/21 0433 02/08/21 0446 02/08/21 0903  NA 138 140 141  --  141  --   --  137  K 3.3* 3.5 3.1*  --  3.4*  --   --  3.2*  CL 98 100 100  --  99  --   --  98  CO2 27 28 29   --  32  --   --  29  GLUCOSE 108* 109* 111*  --  139*  --   --  157*  BUN 105* 93*  83*  --  72*  --   --  68*  CREATININE 3.40* 3.12* 2.59*  --  2.49*  --   --  2.24*  CALCIUM 8.6* 8.2* 8.2*  --  8.3*  --   --  8.2*  MG  --  2.7* 2.5* 2.3  --  2.1 2.3  --     Liver Function Tests: Recent Labs  Lab 02/04/21 0457  AST 52*  ALT 77*   No results for input(s): LIPASE, AMYLASE in the last 168 hours. No results for input(s): AMMONIA in the last 168 hours.  CBC: Recent Labs  Lab 02/03/21 0504 02/07/21 1240 02/08/21 0903  WBC 13.3* 8.8 6.5  NEUTROABS  --  6.5  --   HGB 12.9* 12.5* 12.3*  HCT 38.6* 38.0* 37.1*  MCV 91.5 91.6 92.1  PLT 141* 138* 134*    Cardiac Enzymes: No results for input(s): CKTOTAL, CKMB, CKMBINDEX, TROPONINI in the last 168 hours.  BNP: Invalid input(s): POCBNP  CBG: Recent Labs  Lab 02/01/21 1230 02/01/21 1638  GLUCAP 112* 149*    Microbiology: Results for orders placed or performed during the hospital encounter of 01/31/21  Resp Panel by RT-PCR (Flu A&B, Covid) Nasopharyngeal Swab  Status: None   Collection Time: 01/31/21 11:49 AM   Specimen: Nasopharyngeal Swab; Nasopharyngeal(NP) swabs in vial transport medium  Result Value Ref Range Status   SARS Coronavirus 2 by RT PCR NEGATIVE NEGATIVE Final    Comment: (NOTE) SARS-CoV-2 target nucleic acids are NOT DETECTED.  The SARS-CoV-2 RNA is generally detectable in upper respiratory specimens during the acute phase of infection. The lowest concentration of SARS-CoV-2 viral copies this assay can detect is 138 copies/mL. A negative result does not preclude SARS-Cov-2 infection and should not be used as the sole basis for treatment or other patient management decisions. A negative result may occur with  improper specimen collection/handling, submission of specimen other than nasopharyngeal swab, presence of viral mutation(s) within the areas targeted by this assay, and inadequate number of viral copies(<138 copies/mL). A negative result must be combined with clinical  observations, patient history, and epidemiological information. The expected result is Negative.  Fact Sheet for Patients:  EntrepreneurPulse.com.au  Fact Sheet for Healthcare Providers:  IncredibleEmployment.be  This test is no t yet approved or cleared by the [place] FDA and  has been authorized for detection and/or diagnosis of SARS-CoV-2 by FDA under an Emergency Use Authorization (EUA). This EUA will remain  in effect (meaning this test can be used) for the duration of the COVID-19 declaration under Section 564(b)(1) of the Act, 21 U.S.C.section 360bbb-3(b)(1), unless the authorization is terminated  or revoked sooner.       Influenza A by PCR NEGATIVE NEGATIVE Final   Influenza B by PCR NEGATIVE NEGATIVE Final    Comment: (NOTE) The Xpert Xpress SARS-CoV-2/FLU/RSV plus assay is intended as an aid in the diagnosis of influenza from Nasopharyngeal swab specimens and should not be used as a sole basis for treatment. Nasal washings and aspirates are unacceptable for Xpert Xpress SARS-CoV-2/FLU/RSV testing.  Fact Sheet for Patients: EntrepreneurPulse.com.au  Fact Sheet for Healthcare Providers: IncredibleEmployment.be  This test is not yet approved or cleared by the [place] FDA and has been authorized for detection and/or diagnosis of SARS-CoV-2 by FDA under an Emergency Use Authorization (EUA). This EUA will remain in effect (meaning this test can be used) for the duration of the COVID-19 declaration under Section 564(b)(1) of the Act, 21 U.S.C. section 360bbb-3(b)(1), unless the authorization is terminated or revoked.  Performed at [facility], [address] [place], [place] [address]     Coagulation Studies: No results for input(s): LABPROT, INR in the last 72 hours.  Urinalysis: No results for input(s): COLORURINE, LABSPEC, PHURINE, GLUCOSEU, HGBUR, BILIRUBINUR,  KETONESUR, PROTEINUR, UROBILINOGEN, NITRITE, LEUKOCYTESUR in the last 72 hours.  Invalid input(s): APPERANCEUR    Imaging: DG Chest Port 1 View  Result Date: 02/07/2021 CLINICAL DATA:  Acute hypoxemic respiratory failure. EXAM: PORTABLE CHEST 1 VIEW COMPARISON:  Feb 05, 2021. FINDINGS: Stable cardiomegaly. No pneumothorax is noted. Status post aortic valve repair and coronary artery bypass graft. Stable bibasilar atelectasis or infiltrates are noted with small pleural effusions. Bony thorax is unremarkable. IMPRESSION: Stable bibasilar atelectasis or infiltrates are noted with small bilateral pleural effusions. Aortic Atherosclerosis (ICD10-I70.0). Electronically Signed   By: Marijo Conception M.D.   On: 02/07/2021 11:53     Medications:   . sodium chloride 250 mL (02/03/21 0825)   . amiodarone  200 mg Oral BID  . aspirin EC  81 mg Oral BID  . fluticasone  1 spray Each Nare Daily  . losartan  25 mg Oral Daily  . mirtazapine  15  mg Oral QHS  . sodium chloride flush  3 mL Intravenous Q12H  . torsemide  40 mg Oral Daily   sodium chloride, acetaminophen, albuterol, ALPRAZolam, melatonin, menthol-cetylpyridinium, ondansetron (ZOFRAN) IV, sodium chloride flush  Assessment/ Plan:  Mr. KENI ELISON is a 87 y.o.  male with past medical history of CAD with CABG, hypertension, hyperlipidemia, aortic valve replacement, heart failure with reduced EF, and CKD. He presents to the ED with shortness of breath that has progressed for a while.  # Acute Kidney Injury on chronic kidney disease stage 4  with baseline creatinine 2.2 and GFR of 28 on 01/04/21. Marland Kitchen Acute kidney injury complicated by cardiorenal syndrome Acute kidney injury secondary to fluid overload Torsemide 40mg  daily Creatinine at baseline UOP 2L Cleared to discharge from renal stance with follow up with our office  #Acute on  Chronic systolic CHF,  Patient admitted with with Volume overload and acute pulmonary edema.  Patient 2D  echo shows left ventricular ejection fraction, by estimation, is <20%.    # Anemia of chronic kidney disease Lab Results  Component Value Date   HGB 12.3 (L) 02/08/2021  Hgb above target Will monitor  # Hypotension   Blood pressure stable for this patient Likely secondary to IV diuresis Losartan Will monitor and adjust medications as needed  #  Hypokalemia Secondary to diuretics 3.2 today Potassium 52mEq po once     LOS: 8 Kylin Dubs 5/25/202210:02 AM

## 2021-02-08 NOTE — Discharge Summary (Signed)
[name] VZD:[id number] DOB: October 22, 1930 DOA: 01/31/2021  PCP: Baxter Hire, MD  Admit date: 01/31/2021 Discharge date: 02/08/2021  Time spent: 35 minutes  Recommendations for Outpatient Follow-up:  1. Cardiology f/u 1 week, will need check of electrolytes 2. pcp f/u  3. ENT f/u if continued nose-bleeds    Discharge Diagnoses:  Active Problems:   CHF exacerbation (HCC)   CHF (congestive heart failure), NYHA class IV, acute on chronic, systolic (HCC)   Unstageable pressure ulcer of sacral region [facility])   Discharge Condition: fair  Diet recommendation: heart healthy  Filed Weights   02/06/21 1000 02/07/21 0500 02/08/21 0352  Weight: 73.2 kg 72.5 kg 74.4 kg    History of present illness:  87 y.o.malewith known history of MGUS, chronic kidney disease stage IIIb, coronary disease status post CABG in the past, HFrEF, status post aortic valve replacement, hypertension and hyperlipidemia.Patient has had recurrent pleural effusion and is status post recent thoracentesis a few days ago. He presents to the emergency room on account of progressive worsening dyspnea. He also admitted to worsening lower extremity edema. He admits compliance with his medication including diuretics with no significant improvement. He has developed significant dyspnea with minimal exertion.   Hospital Course:  Acute hypoxic respiratory failure Acute decompensated systolic congestive heart failure Cardiology following Difficulty optimizing volume status  EF 15 to 20% Start Lasix GTT on 5/19.  Appears to be improving.  Less short of breath Lasix drip stopped 5/21.  Started back on torsemide 40 mg daily Remains on 2 L nasal cannula, ordered for home use Plan: Continue torsemide 40 mg daily, start potassium supplement, will need check of kidney function and electrolytes at 1 wk f/u Wean oxygen as tolerated Home oxygen ordered  Tachyarrhythmia Suspected atrial fibrillation with  aberrancy Cardiology following Started on amiodarone, now switched to p.o. Continue amiodarone on discharge Follow-up outpatient cardiology Cardiology did not advise anticoagulation  Acute kidney injury on chronic kidney disease stage IIIb Improved to baseline cr of 2.2 w/diuresis - f/u nephrology outpatient  Bilateral pleural effusion S/p thoracentesis, transudative - o2 as above, diuresis as above  Epistaxis Evaluated by pulm, thought 2/2 nasal cannula, heparin - outpt ENT f/u if persists   Procedures:  none   Consultations:  Cardiology, nephrology, pulmonology  Discharge Exam: Vitals:   02/08/21 0352 02/08/21 0956  BP: 104/67 95/64  Pulse: 69 74  Resp:  (!) 21  Temp: 97.6 F (36.4 C) 97.6 F (36.4 C)  SpO2: 100% 100%    General exam: Appears calm and comfortable  Respiratory system: Crackles at bases.  Normal work of breathing.  2 L  cardiovascular system: Regular rate and rhythm, no murmurs. No le edema Gastrointestinal system: Abdomen is nondistended, soft and nontender. No organomegaly or masses felt. Normal bowel sounds heard. Central nervous system: Alert and oriented. No focal neurological deficits. Extremities: Symmetric 5 x 5 power. Skin: No rashes, lesions or ulcers Psychiatry: Judgement and insight appear normal. Mood & affect appropriate.   Discharge Instructions   Discharge Instructions    Diet - low sodium heart healthy   Complete by: As directed    Increase activity slowly   Complete by: As directed    Leave dressing on - Keep it clean, dry, and intact until clinic visit   Complete by: As directed      Allergies as of 02/08/2021      Reactions   Morphine Anaphylaxis   Oxycodone Other (See Comments)   Hypotension and bradycardia  Carvedilol    Dizziness and syncope    Iodinated Diagnostic Agents Rash   Other reaction(s): Asthenia (finding), Other (qualifier value) paralyzed legs Other reaction(s): RASH   Lisinopril Cough,  Other (See Comments)      Medication List    STOP taking these medications   sodium bicarbonate 650 MG tablet     TAKE these medications   albuterol 108 (90 Base) MCG/ACT inhaler Commonly known as: VENTOLIN HFA Inhale into the lungs.   ALPRAZolam 0.25 MG tablet Commonly known as: XANAX Take 0.25 mg by mouth 2 (two) times daily as needed.   amiodarone 200 MG tablet Commonly known as: PACERONE Take 1 tablet (200 mg total) by mouth 2 (two) times daily.   aspirin EC 81 MG tablet Take 1 tablet (81 mg total) by mouth 2 (two) times daily. Swallow whole.   fluticasone 50 MCG/ACT nasal spray Commonly known as: FLONASE USE 2 SPRAYS INTO BOTH NOSTRILS DAILY   losartan 25 MG tablet Commonly known as: COZAAR Take 1 tablet (25 mg total) by mouth daily.   melatonin 3 MG Tabs tablet Take 1.5 mg by mouth at bedtime as needed (sleep).   potassium chloride SA 20 MEQ tablet Commonly known as: KLOR-CON Take 2 tablets (40 mEq total) by mouth daily.   pravastatin 20 MG tablet Commonly known as: PRAVACHOL Take 20 mg by mouth every evening.   torsemide 20 MG tablet Commonly known as: DEMADEX Take 2 tablets (40 mg total) by mouth daily.   traMADol 50 MG tablet Commonly known as: ULTRAM Take 50 mg by mouth every 4 (four) hours as needed.   zolpidem 5 MG tablet Commonly known as: AMBIEN Take by mouth.            Durable Medical Equipment  (From admission, onward)         Start     Ordered   02/06/21 1038  For home use only DME oxygen  Once       Question Answer Comment  Length of Need Lifetime   Mode or (Route) Nasal cannula   Liters per Minute 2   Frequency Continuous (stationary and portable oxygen unit needed)   Oxygen conserving device Yes   Oxygen delivery system Gas      02/06/21 1038   Unscheduled  DME Oxygen  Once       Question Answer Comment  Length of Need 6 Months   Mode or (Route) Nasal cannula   Liters per Minute 2   Frequency Continuous (stationary  and portable oxygen unit needed)   Oxygen conserving device Yes   Oxygen delivery system Gas      02/08/21 0959           Discharge Care Instructions  (From admission, onward)         Start     Ordered   02/08/21 0000  Leave dressing on - Keep it clean, dry, and intact until clinic visit        02/08/21 0959         Allergies  Allergen Reactions  . Morphine Anaphylaxis  . Oxycodone Other (See Comments)    Hypotension and bradycardia  . Carvedilol     Dizziness and syncope   . Iodinated Diagnostic Agents Rash    Other reaction(s): Asthenia (finding), Other (qualifier value) paralyzed legs Other reaction(s): RASH   . Lisinopril Cough and Other (See Comments)    Follow-up Information    Corey Skains, MD. Schedule an appointment as  soon as possible for a visit in 1 week(s).   Specialty: Cardiology Contact information: [facility] [place] [address] [phone]        Baxter Hire, MD. Schedule an appointment as soon as possible for a visit.   Specialty: Internal Medicine Contact information: [place] [address] [place], [place] [facility].   Specialty: Nephrology Contact information: [address] [place] [address] [phone]        [facility].   Why: as needed if persistent nose bleeds Contact information: [place] [phone]               The results of significant diagnostics from this hospitalization (including imaging, microbiology, ancillary and laboratory) are listed below for reference.    Significant Diagnostic Studies: DG Chest 1 View  Result Date: 01/27/2021 CLINICAL DATA:  87 year old male with history of CHF status post left thoracentesis. EXAM: CHEST  1 VIEW COMPARISON:  01/25/2021 FINDINGS: Stable  cardiomediastinal silhouette with unchanged moderate cardiomegaly. Persistent but decreased blunting of the right costophrenic angle. Persistent passive in cicatricial atelectasis in the right lower lobe. Similar appearing left costophrenic angle blunting and mild retrocardiac opacities. No evidence pneumothorax. No new focal consolidations. Atherosclerotic calcification of the aortic arch. Median sternotomy wires remain in place and appear intact in addition to postsurgical changes after coronary artery bypass and aortic valve replacement. No acute osseous abnormality. IMPRESSION: Trace residual right pleural effusion after right thoracentesis. No evidence of pneumothorax. Electronically Signed   By: Ruthann Cancer MD   On: 01/27/2021 09:37   DG Chest Port 1 View  Result Date: 02/07/2021 CLINICAL DATA:  Acute hypoxemic respiratory failure. EXAM: PORTABLE CHEST 1 VIEW COMPARISON:  Feb 05, 2021. FINDINGS: Stable cardiomegaly. No pneumothorax is noted. Status post aortic valve repair and coronary artery bypass graft. Stable bibasilar atelectasis or infiltrates are noted with small pleural effusions. Bony thorax is unremarkable. IMPRESSION: Stable bibasilar atelectasis or infiltrates are noted with small bilateral pleural effusions. Aortic Atherosclerosis (ICD10-I70.0). Electronically Signed   By: Marijo Conception M.D.   On: 02/07/2021 11:53   DG Chest Port 1 View  Result Date: 02/05/2021 CLINICAL DATA:  Hemoptysis and shortness of breath. EXAM: PORTABLE CHEST 1 VIEW COMPARISON:  02/02/2021 and prior radiographs FINDINGS: Cardiomegaly, aortic valve replacement and mild pulmonary vascular congestion noted. Bilateral LOWER lung opacities/atelectasis and bilateral pleural effusions are again identified, slightly increased on the RIGHT. There is no evidence of pneumothorax. No other changes noted. IMPRESSION: 1. Bilateral LOWER lung opacities/atelectasis and bilateral pleural effusions, slightly increased on the  RIGHT. 2. Cardiomegaly with mild pulmonary vascular congestion. Electronically Signed   By: Margarette Canada M.D.   On: 02/05/2021 16:02   DG Chest Port 1 View  Result Date: 02/02/2021 CLINICAL DATA:  Shortness of breath EXAM: PORTABLE CHEST 1 VIEW COMPARISON:  Jan 31, 2021 FINDINGS: There is consolidation in a portion of the left base. There is a partially loculated pleural effusion on the left. There is a smaller partially loculated pleural effusion on the right. There is cardiomegaly with pulmonary vascularity within normal limits. Patient is status post coronary artery bypass grafting and aortic valve replacement. No adenopathy. There is aortic atherosclerosis. No bone lesions. IMPRESSION: Airspace opacity concerning for pneumonia with atelectasis left lower lobe. Loculated pleural effusion on each side, slightly  larger on the left the right. Stable cardiomegaly with postoperative changes. Aortic Atherosclerosis (ICD10-I70.0). Electronically Signed   By: Lowella Grip III M.D.   On: 02/02/2021 08:15   DG Chest Port 1 View  Result Date: 01/31/2021 CLINICAL DATA:  Shortness of breath. EXAM: PORTABLE CHEST 1 VIEW COMPARISON:  Chest x-ray dated Jan 27, 2021. FINDINGS: Stable cardiomegaly status post CABG and AVR. Unchanged pulmonary vascular congestion and interstitial thickening. Unchanged small bilateral pleural effusions and bibasilar atelectasis. No pneumothorax. No acute osseous abnormality. IMPRESSION: 1. Unchanged congestive heart failure. Electronically Signed   By: Titus Dubin M.D.   On: 01/31/2021 11:47   DG Chest Port 1 View  Result Date: 01/25/2021 CLINICAL DATA:  CHF, status post left thoracentesis EXAM: PORTABLE CHEST 1 VIEW COMPARISON:  09/09/2020 FINDINGS: Improvement in the left effusion following thoracentesis. No pneumothorax. Small right effusion noted. Heart is enlarged with vascular congestion and basilar atelectasis versus scarring. Aorta atherosclerotic. Remote coronary bypass  changes and cardiac valve replacement. Degenerative changes of the spine. Bones are osteopenic. IMPRESSION: No pneumothorax following left thoracentesis. Electronically Signed   By: Jerilynn Mages.  Shick M.D.   On: 01/25/2021 14:06   ECHOCARDIOGRAM COMPLETE  Result Date: 02/01/2021    ECHOCARDIOGRAM REPORT   Patient Name:   BURNICE OESTREICHER Date of Exam: 01/31/2021 Medical Rec #:  [id number]       Height:       71.0 in Accession #:    [id number]      Weight:       160.0 lb Date of Birth:  1930-10-28       BSA:          1.918 m Patient Age:    87 years        BP:           122/101 mmHg Patient Gender: M               HR:           106 bpm. Exam Location:  [facility] Procedure: 2D Echo, Cardiac Doppler and Color Doppler Indications:     R06.03 Acute Respiratory distress  History:         Patient has no prior history of Echocardiogram examinations.                  Aortic Valve replacement; Risk Factors:Dyslipidemia, Former                  Smoker and Hypertension. Coronary artery disease. Chronic                  kidney disease.  Sonographer:     Wilford Sports Rodgers-Jones Referring Phys:  [id number] [place] Diagnosing Phys: Bartholome Bill MD IMPRESSIONS  1. Left ventricular ejection fraction, by estimation, is <20%. The left ventricle has severely decreased function. The left ventricle has no regional wall motion abnormalities. The left ventricular internal cavity size was mildly dilated. Left ventricular diastolic parameters were normal.  2. Right ventricular systolic function is normal. The right ventricular size is mildly enlarged.  3. Left atrial size was mildly dilated.  4. Right atrial size was mildly dilated.  5. The mitral valve is grossly normal. Mild to moderate mitral valve regurgitation.  6. Tricuspid valve regurgitation is moderate.  7. The aortic valve is grossly normal. Aortic valve regurgitation is trivial. Mild aortic valve stenosis.  8. Aortic dilatation noted. FINDINGS  Left Ventricle: Left ventricular ejection  fraction, by estimation, is <20%. The  left ventricle has severely decreased function. The left ventricle has no regional wall motion abnormalities. The left ventricular internal cavity size was mildly dilated. There is no left ventricular hypertrophy. Left ventricular diastolic parameters were normal. Right Ventricle: The right ventricular size is mildly enlarged. Right vetricular wall thickness was not well visualized. Right ventricular systolic function is normal. Left Atrium: Left atrial size was mildly dilated. Right Atrium: Right atrial size was mildly dilated. Pericardium: There is no evidence of pericardial effusion. Mitral Valve: The mitral valve is grossly normal. Mild to moderate mitral valve regurgitation. Tricuspid Valve: The tricuspid valve is not well visualized. Tricuspid valve regurgitation is moderate. Aortic Valve: The aortic valve is grossly normal. Aortic valve regurgitation is trivial. Mild aortic stenosis is present. Aortic valve mean gradient measures 8.2 mmHg. Aortic valve peak gradient measures 12.9 mmHg. Aortic valve area, by VTI measures 0.77  cm. Pulmonic Valve: The pulmonic valve was not well visualized. Pulmonic valve regurgitation is mild. Aorta: Aortic dilatation noted. IAS/Shunts: The atrial septum is grossly normal.  LEFT VENTRICLE PLAX 2D LVIDd:         5.88 cm LVIDs:         5.57 cm LV PW:         0.79 cm LV IVS:        0.85 cm LVOT diam:     2.10 cm LV SV:         20 LV SV Index:   10 LVOT Area:     3.46 cm  RIGHT VENTRICLE            IVC RV Basal diam:  5.06 cm    IVC diam: 2.42 cm RV S prime:     7.62 cm/s TAPSE (M-mode): 0.6 cm LEFT ATRIUM             Index       RIGHT ATRIUM           Index LA diam:        4.80 cm 2.50 cm/m  RA Area:     27.90 cm LA Vol (A2C):   98.4 ml 51.31 ml/m RA Volume:   108.00 ml 56.31 ml/m LA Vol (A4C):   82.8 ml 43.17 ml/m LA Biplane Vol: 93.1 ml 48.55 ml/m  AORTIC VALVE AV Area (Vmax):    0.73 cm AV Area (Vmean):   0.72 cm AV Area (VTI):      0.77 cm AV Vmax:           179.25 cm/s AV Vmean:          133.750 cm/s AV VTI:            0.256 m AV Peak Grad:      12.9 mmHg AV Mean Grad:      8.2 mmHg LVOT Vmax:         37.93 cm/s LVOT Vmean:        27.833 cm/s LVOT VTI:          0.057 m LVOT/AV VTI ratio: 0.22  AORTA Ao Root diam: 4.10 cm MV E velocity: 65.00 cm/s  TRICUSPID VALVE MV A velocity: 35.90 cm/s  TR Peak grad:   30.7 mmHg MV E/A ratio:  1.81        TR Vmax:        277.00 cm/s                             SHUNTS  Systemic VTI:  0.06 m                            Systemic Diam: 2.10 cm Bartholome Bill MD Electronically signed by Bartholome Bill MD Signature Date/Time: 02/01/2021/6:58:29 AM    Final    US THORACENTESIS ASP PLEURAL SPACE W/IMG GUIDE  Result Date: 01/27/2021 INDICATION: Patient with a history of heart failure and recurrent bilateral pleural effusions. Interventional radiology asked to perform a therapeutic and diagnostic thoracentesis. EXAM: ULTRASOUND GUIDED THORACENTESIS MEDICATIONS: % lidocaine 10 mL COMPLICATIONS: None immediate. PROCEDURE: An ultrasound guided thoracentesis was thoroughly discussed with the patient and questions answered. The benefits, risks, alternatives and complications were also discussed. The patient understands and wishes to proceed with the procedure. Written consent was obtained. Ultrasound was performed to localize and mark an adequate pocket of fluid in the right chest. The area was then prepped and draped in the normal sterile fashion. 1% Lidocaine was used for local anesthesia. Under ultrasound guidance a 6 Fr Safe-T-Centesis catheter was introduced. Thoracentesis was performed. The catheter was removed and a dressing applied. FINDINGS: A total of approximately 400 mL of amber-colored fluid was removed. Samples were sent to the laboratory as requested by the clinical team. IMPRESSION: Successful ultrasound guided right thoracentesis yielding 400 mL of pleural fluid. Read by: Soyla Dryer, NP Electronically Signed   By: Ruthann Cancer MD   On: 01/27/2021 09:41   US THORACENTESIS ASP PLEURAL SPACE W/IMG GUIDE  Result Date: 01/25/2021 INDICATION: CHF, pleural effusions EXAM: ULTRASOUND GUIDED LEFT THORACENTESIS MEDICATIONS: 1% lidocaine local COMPLICATIONS: None immediate. PROCEDURE: An ultrasound guided thoracentesis was thoroughly discussed with the patient and questions answered. The benefits, risks, alternatives and complications were also discussed. The patient understands and wishes to proceed with the procedure. Written consent was obtained. Ultrasound was performed to localize and mark an adequate pocket of fluid in the left chest. The area was then prepped and draped in the normal sterile fashion. 1% Lidocaine was used for local anesthesia. Under ultrasound guidance a 6 Fr Safe-T-Centesis catheter was introduced. Thoracentesis was performed. The catheter was removed and a dressing applied. FINDINGS: A total of approximately 500 cc of blood tinged pleural fluid was removed. Samples were sent to the laboratory as requested by the clinical team. IMPRESSION: Successful ultrasound guided left thoracentesis yielding 500 cc of pleural fluid. Electronically Signed   By: Jerilynn Mages.  Shick M.D.   On: 01/25/2021 14:04    Microbiology: Recent Results (from the past 240 hour(s))  Resp Panel by RT-PCR (Flu A&B, Covid) Nasopharyngeal Swab     Status: None   Collection Time: 01/31/21 11:49 AM   Specimen: Nasopharyngeal Swab; Nasopharyngeal(NP) swabs in vial transport medium  Result Value Ref Range Status   SARS Coronavirus 2 by RT PCR NEGATIVE NEGATIVE Final    Comment: (NOTE) SARS-CoV-2 target nucleic acids are NOT DETECTED.  The SARS-CoV-2 RNA is generally detectable in upper respiratory specimens during the acute phase of infection. The lowest concentration of SARS-CoV-2 viral copies this assay can detect is 138 copies/mL. A negative result does not preclude SARS-Cov-2 infection and  should not be used as the sole basis for treatment or other patient management decisions. A negative result may occur with  improper specimen collection/handling, submission of specimen other than nasopharyngeal swab, presence of viral mutation(s) within the areas targeted by this assay, and inadequate number of viral copies(<138 copies/mL). A negative result must be combined with clinical observations, patient  history, and epidemiological information. The expected result is Negative.  Fact Sheet for Patients:  EntrepreneurPulse.com.au  Fact Sheet for Healthcare Providers:  IncredibleEmployment.be  This test is no t yet approved or cleared by the [place] FDA and  has been authorized for detection and/or diagnosis of SARS-CoV-2 by FDA under an Emergency Use Authorization (EUA). This EUA will remain  in effect (meaning this test can be used) for the duration of the COVID-19 declaration under Section 564(b)(1) of the Act, 21 U.S.C.section 360bbb-3(b)(1), unless the authorization is terminated  or revoked sooner.       Influenza A by PCR NEGATIVE NEGATIVE Final   Influenza B by PCR NEGATIVE NEGATIVE Final    Comment: (NOTE) The Xpert Xpress SARS-CoV-2/FLU/RSV plus assay is intended as an aid in the diagnosis of influenza from Nasopharyngeal swab specimens and should not be used as a sole basis for treatment. Nasal washings and aspirates are unacceptable for Xpert Xpress SARS-CoV-2/FLU/RSV testing.  Fact Sheet for Patients: EntrepreneurPulse.com.au  Fact Sheet for Healthcare Providers: IncredibleEmployment.be  This test is not yet approved or cleared by the [place] FDA and has been authorized for detection and/or diagnosis of SARS-CoV-2 by FDA under an Emergency Use Authorization (EUA). This EUA will remain in effect (meaning this test can be used) for the duration of the COVID-19 declaration  under Section 564(b)(1) of the Act, 21 U.S.C. section 360bbb-3(b)(1), unless the authorization is terminated or revoked.  Performed at [facility], [address] [place], [place] [address]      Labs: Basic Metabolic Panel: Recent Labs  Lab 02/03/21 [phone] 02/04/21 0457 02/05/21 6712 02/06/21 0504 02/06/21 1322 02/07/21 0433 02/08/21 0446 02/08/21 0903  NA 138 140 141  --  141  --   --  137  K 3.3* 3.5 3.1*  --  3.4*  --   --  3.2*  CL 98 100 100  --  99  --   --  98  CO2 27 28 29   --  32  --   --  29  GLUCOSE 108* 109* 111*  --  139*  --   --  157*  BUN 105* 93* 83*  --  72*  --   --  68*  CREATININE 3.40* 3.12* 2.59*  --  2.49*  --   --  2.24*  CALCIUM 8.6* 8.2* 8.2*  --  8.3*  --   --  8.2*  MG  --  2.7* 2.5* 2.3  --  2.1 2.3  --    Liver Function Tests: Recent Labs  Lab 02/04/21 0457  AST 52*  ALT 77*   No results for input(s): LIPASE, AMYLASE in the last 168 hours. No results for input(s): AMMONIA in the last 168 hours. CBC: Recent Labs  Lab 02/03/21 0504 02/07/21 1240 02/08/21 0903  WBC 13.3* 8.8 6.5  NEUTROABS  --  6.5  --   HGB 12.9* 12.5* 12.3*  HCT 38.6* 38.0* 37.1*  MCV 91.5 91.6 92.1  PLT 141* 138* 134*   Cardiac Enzymes: No results for input(s): CKTOTAL, CKMB, CKMBINDEX, TROPONINI in the last 168 hours. BNP: BNP (last 3 results) Recent Labs    08/05/20 2018 09/06/20 2342 01/31/21 1109  BNP 2,713.8* 3,448.5* >4,500.0*    ProBNP (last 3 results) No results for input(s): PROBNP in the last 8760 hours.  CBG: Recent Labs  Lab 02/01/21 1230 02/01/21 1638  GLUCAP 112* 149*       Signed:  Desma Maxim MD.  [facility] 02/08/2021, 9:59 AM

## 2021-02-08 NOTE — TOC Transition Note (Signed)
Transition of Care [facility]) - CM/SW Discharge Note   Patient Details  Name: [name] MRN: [id number] Date of Birth: [birth year]  [facility] CM/SW Contact:  Alberteen Sam, LCSW Phone Number: 02/08/2021, 10:23 AM   Clinical Narrative:     Patient will DC to: Home with health health Anticipated DC date: 02/08/21 Family notified: Pam - lvm Transport by: family Marcello Moores and Butch Penny  Per MD patient ready for DC to home w/home health . RN, patient, patient's family notified of DC.   CSW spoke with patient's daughter [name] informed of home health services PT, OT and RN being provided through [facility] per patient choice, and that oxygen is in room. Pam inquired about CNA, CSW informed her extra help would be private pay they can look into. Pam expressed understanding and reports her brother Leroy Pettway and his wife Austine Wiedeman will be picking patient up today and to call them. CSW tried [facility] at [phone] no answer and vm not set up. CSW called Butch Penny at [phone] no answer.   Patient is set up with home health services and oxygen in room, no other discharge needs identified as family should be picking up today.   CSW signing off.  Pricilla Riffle, LCSW    Final next level of care: [place] Barriers to Discharge: No Barriers Identified   Patient Goals and CMS Choice Patient states their goals for this hospitalization and ongoing recovery are:: to go home CMS Medicare.gov Compare Post Acute Care list provided to:: Patient Choice offered to / list presented to : Patient  Discharge Placement                       Discharge Plan and Services                DME Arranged: Oxygen DME Agency: [employer] Date DME Agency Contacted: 02/06/21 Time DME Agency Contacted: [id number] Representative spoke with at DME Agency: [place]: OT,PT,RN [place]: [place] ([place]) Date [place]: 02/08/21 Time [place]:  1022 Representative spoke with at [place]: [place] ([place]) Interventions     Readmission Risk Interventions No flowsheet data found.

## 2021-02-08 NOTE — Progress Notes (Signed)
[facility]    SUBJECTIVE: Complains of persistent shortness of breath fatigue but somewhat improved from yesterday no pain no leg edema no further hemoptysis or hematemesis slept better last night   Vitals:   02/07/21 1912 02/07/21 2322 02/08/21 0258 02/08/21 0352  BP: 112/69 (!) 99/59 101/65 104/67  Pulse: 75 76 69 69  Resp: 18 16 16    Temp: 98.2 F (36.8 C) 98.1 F (36.7 C) 97.8 F (36.6 C) 97.6 F (36.4 C)  TempSrc: Oral Oral  Oral  SpO2: 100% 99%  100%  Weight:    74.4 kg  Height:         Intake/Output Summary (Last 24 hours) at 02/08/2021 8250 Last data filed at 02/07/2021 2140 Gross per 24 hour  Intake 243 ml  Output 1600 ml  Net -1357 ml      PHYSICAL EXAM  General: Well developed, well nourished, in no acute distress HEENT:  Normocephalic and atramatic Neck:  No JVD.  Lungs: Clear bilaterally to auscultation and percussion. Heart: HRRR . Normal S1 and S2 without gallops or murmurs.  Abdomen: Bowel sounds are positive, abdomen soft and non-tender  Msk:  Back normal, normal gait. Normal strength and tone for age. Extremities: No clubbing, cyanosis or edema.   Neuro: Alert and oriented X 3. Psych:  Good affect, responds appropriately   LABS: Basic Metabolic Panel: Recent Labs    02/06/21 1322 02/07/21 0433 02/08/21 0446  NA 141  --   --   K 3.4*  --   --   CL 99  --   --   CO2 32  --   --   GLUCOSE 139*  --   --   BUN 72*  --   --   CREATININE 2.49*  --   --   CALCIUM 8.3*  --   --   MG  --  2.1 2.3   Liver Function Tests: No results for input(s): AST, ALT, ALKPHOS, BILITOT, PROT, ALBUMIN in the last 72 hours. No results for input(s): LIPASE, AMYLASE in the last 72 hours. CBC: Recent Labs    02/07/21 1240  WBC 8.8  NEUTROABS 6.5  HGB 12.5*  HCT 38.0*  MCV 91.6  PLT 138*   Cardiac Enzymes: No results for input(s): CKTOTAL, CKMB, CKMBINDEX, TROPONINI in the last 72 hours. BNP: Invalid input(s): POCBNP D-Dimer: No results for  input(s): DDIMER in the last 72 hours. Hemoglobin A1C: No results for input(s): HGBA1C in the last 72 hours. Fasting Lipid Panel: No results for input(s): CHOL, HDL, LDLCALC, TRIG, CHOLHDL, LDLDIRECT in the last 72 hours. Thyroid Function Tests: No results for input(s): TSH, T4TOTAL, T3FREE, THYROIDAB in the last 72 hours.  Invalid input(s): FREET3 Anemia Panel: No results for input(s): VITAMINB12, FOLATE, FERRITIN, TIBC, IRON, RETICCTPCT in the last 72 hours.  DG Chest Port 1 View  Result Date: 02/07/2021 CLINICAL DATA:  Acute hypoxemic respiratory failure. EXAM: PORTABLE CHEST 1 VIEW COMPARISON:  Feb 05, 2021. FINDINGS: Stable cardiomegaly. No pneumothorax is noted. Status post aortic valve repair and coronary artery bypass graft. Stable bibasilar atelectasis or infiltrates are noted with small pleural effusions. Bony thorax is unremarkable. IMPRESSION: Stable bibasilar atelectasis or infiltrates are noted with small bilateral pleural effusions. Aortic Atherosclerosis (ICD10-I70.0). Electronically Signed   By: Marijo Conception M.D.   On: 02/07/2021 11:53     Echo severely depressed left ventricular function EF less than 25%  TELEMETRY: Atrial fibrillation rate of around 90:  ASSESSMENT AND PLAN:  Active  Problems:   CHF exacerbation (HCC)   CHF (congestive heart failure), NYHA class IV, acute on chronic, systolic (HCC)   Unstageable pressure ulcer of sacral region ([place]) End-stage dilated cardiomyopathy Persistent dyspnea shortness of breath Hypoxemia Atrial fibrillation  Plan Continue diuretic therapy Maintain p.o. amiodarone therapy for arrhythmia Continue to try to wean supplemental oxygen Monitor and correct electrolyte abnormalities Gradually increase activity with physical therapy occupational therapy Recommend nephrology input to follow renal sufficiency Chronic recurrent pleural effusion status postthoracentesis Hopefully able to discharge home soon Follow-up with  cardiology 1 to 2 weeks after discharge Reduce amiodarone 200 mg once a day within a week   Yolonda Kida, MD 02/08/2021 8:14 AM

## 2021-02-08 NOTE — Progress Notes (Signed)
Chaplain Burris met with [name] who expressed looking forward to being discharged today. Chaplain Burris provided compassionate presence and returned to room briefly to wish [name] a happy birthday.

## 2021-02-08 NOTE — Discharge Instructions (Signed)
Heart Failure, Self-Care Heart failure is a serious condition. The following information explains things you need to do to take care of yourself at home. To help you stay as healthy as possible, you may be asked to change your diet, take certain medicines, and make other changes in your life. Your doctor may also give you more specific instructions. If you have problems or questions, call your doctor. What are the risks? Having heart failure makes it more likely for you to have some problems. These problems can get worse if you do not take good care of yourself. Problems may include:  Damage to the kidneys, liver, or lungs.  Malnutrition.  Abnormal heart rhythms.  Blood clotting problems that could cause a stroke. Supplies needed:  Scale for weighing yourself.  Blood pressure monitor.  Notebook.  Medicines. How to care for yourself when you have heart failure Medicines Take over-the-counter and prescription medicines only as told by your doctor. Take your medicines every day.  Do not stop taking your medicine unless your doctor tells you to do so.  Do not skip any medicines.  Get your prescriptions refilled before you run out of medicine. This is important.  Talk with your doctor if you cannot afford your medicines. Eating and drinking  Eat heart-healthy foods. Talk with a diet specialist (dietitian) to create an eating plan.  Limit salt (sodium) if told by your doctor. Ask your diet specialist to tell you which seasonings are healthy for your heart.  Cook in healthy ways instead of frying. Healthy ways of cooking include roasting, grilling, broiling, baking, poaching, steaming, and stir-frying.  Choose foods that: ? Have no trans fat. ? Are low in saturated fat and cholesterol.  Choose healthy foods, such as: ? Fresh or frozen fruits and vegetables. ? Fish. ? Low-fat (lean) meats. ? Legumes, such as beans, peas, and lentils. ? Fat-free or low-fat dairy  products. ? Whole-grain foods. ? High-fiber foods.  Limit how much fluid you drink, if told by your doctor.   Alcohol use  Do not drink alcohol if: ? Your doctor tells you not to drink. ? Your heart was damaged by alcohol, or you have very bad heart failure. ? You are pregnant, may be pregnant, or are planning to become pregnant.  If you drink alcohol: ? Limit how much you have to:  0-1 drink a day for women.  0-2 drinks a day for men. ? Know how much alcohol is in your drink. In the [place] one drink equals one 12 oz bottle of beer (355 mL), one 5 oz glass of wine (148 mL), or one 1 oz glass of hard liquor (44 mL). Lifestyle  Do not smoke or use any products that contain nicotine or tobacco. If you need help quitting, ask your doctor. ? Do not use nicotine gum or patches before talking to your doctor.  Do not use illegal drugs.  Lose weight if told by your doctor.  Do physical activity if told by your doctor. Talk to your doctor before you begin an exercise if: ? You are an older adult. ? You have very bad heart failure.  Learn to manage stress. If you need help, ask your doctor.  Get physical rehab (rehabilitation) to help you stay independent and to help with your quality of life.  Participate in a cardiac rehab program. This program helps you improve your health through exercise, education, and counseling.  Plan time to rest when you get tired.   Check weight   and blood pressure  Weigh yourself every day. This will help you to know if fluid is building up in your body. ? Weigh yourself every morning after you pee (urinate) and before you eat breakfast. ? Wear the same amount of clothing each time. ? Write down your daily weight. Give your record to your doctor.  Check and write down your blood pressure as told by your doctor.  Check your pulse as told by your doctor.   Dealing with very hot and very cold weather  If it is very hot: ? Avoid activities that take a  lot of energy. ? Use air conditioning or fans, or find a cooler place. ? Avoid caffeine and alcohol. ? Wear clothing that is loose-fitting, lightweight, and light-colored.  If it is very cold: ? Avoid activities that take a lot of energy. ? Layer your clothes. ? Wear mittens or gloves, a hat, and a face covering when you go outside. ? Avoid alcohol. Follow these instructions at home:  Stay up to date with shots (vaccines). Get pneumococcal and flu (influenza) shots.  Keep all follow-up visits. Contact a doctor if:  You gain 2-3 lb (1-1.4 kg) in 24 hours or 5 lb (2.3 kg) in a week.  You have increasing shortness of breath.  You cannot do your normal activities.  You get tired easily.  You cough a lot.  You do not feel like eating or feel like you may vomit (nauseous).  You have swelling in your hands, feet, ankles, or belly (abdomen).  You cannot sleep well because it is hard to breathe.  You feel like your heart is beating fast (palpitations).  You get dizzy when you stand up.  You feel depressed or sad. Get help right away if:  You have trouble breathing.  You or someone else notices a change in your behavior, such as having trouble staying awake.  You have chest pain or discomfort.  You pass out (faint). These symptoms may be an emergency. Get help right away. Call your local emergency services (911 in the [place]  Do not wait to see if the symptoms will go away.  Do not drive yourself to the hospital. Summary  Heart failure is a serious condition. To care for yourself, you may have to change your diet, take medicines, and make other lifestyle changes.  Take your medicines every day. Do not stop taking them unless your doctor tells you to do so.  Limit salt and eat heart-healthy foods.  Ask your doctor if you can drink alcohol. You may have to stop alcohol use if you have very bad heart failure.  Contact your doctor if you gain weight quickly or feel  that your heart is beating too fast. Get help right away if you pass out or have chest pain or trouble breathing. This information is not intended to replace advice given to you by your health care provider. Make sure you discuss any questions you have with your health care provider. Document Revised: 03/26/2020 Document Reviewed: 03/26/2020 Elsevier Patient Education  2021 Elsevier Inc.  

## 2021-02-09 DIAGNOSIS — I509 Heart failure, unspecified: Secondary | ICD-10-CM | POA: Diagnosis not present

## 2021-02-09 DIAGNOSIS — I5023 Acute on chronic systolic (congestive) heart failure: Secondary | ICD-10-CM | POA: Diagnosis not present

## 2021-02-10 ENCOUNTER — Telehealth: Payer: Self-pay

## 2021-02-10 DIAGNOSIS — F419 Anxiety disorder, unspecified: Secondary | ICD-10-CM | POA: Diagnosis not present

## 2021-02-10 DIAGNOSIS — N1832 Chronic kidney disease, stage 3b: Secondary | ICD-10-CM | POA: Diagnosis not present

## 2021-02-10 DIAGNOSIS — K219 Gastro-esophageal reflux disease without esophagitis: Secondary | ICD-10-CM | POA: Diagnosis not present

## 2021-02-10 DIAGNOSIS — D472 Monoclonal gammopathy: Secondary | ICD-10-CM | POA: Diagnosis not present

## 2021-02-10 DIAGNOSIS — J918 Pleural effusion in other conditions classified elsewhere: Secondary | ICD-10-CM | POA: Diagnosis not present

## 2021-02-10 DIAGNOSIS — I493 Ventricular premature depolarization: Secondary | ICD-10-CM | POA: Diagnosis not present

## 2021-02-10 DIAGNOSIS — E7800 Pure hypercholesterolemia, unspecified: Secondary | ICD-10-CM | POA: Diagnosis not present

## 2021-02-10 DIAGNOSIS — I130 Hypertensive heart and chronic kidney disease with heart failure and stage 1 through stage 4 chronic kidney disease, or unspecified chronic kidney disease: Secondary | ICD-10-CM | POA: Diagnosis not present

## 2021-02-10 DIAGNOSIS — I5023 Acute on chronic systolic (congestive) heart failure: Secondary | ICD-10-CM | POA: Diagnosis not present

## 2021-02-10 DIAGNOSIS — E785 Hyperlipidemia, unspecified: Secondary | ICD-10-CM | POA: Diagnosis not present

## 2021-02-10 DIAGNOSIS — J9601 Acute respiratory failure with hypoxia: Secondary | ICD-10-CM | POA: Diagnosis not present

## 2021-02-10 DIAGNOSIS — I2510 Atherosclerotic heart disease of native coronary artery without angina pectoris: Secondary | ICD-10-CM | POA: Diagnosis not present

## 2021-02-10 DIAGNOSIS — D631 Anemia in chronic kidney disease: Secondary | ICD-10-CM | POA: Diagnosis not present

## 2021-02-10 NOTE — Telephone Encounter (Signed)
Post hospital discharge phone call.   Called and spoke with patient, he states that he is doing as well as he can do.  Has not started to weigh himself daily as he needs to get a scale.  Plans on getting one today.  Family is helping him with eating more low-sodium foods.  Also discussed using fresh and frozen fruits and vegetables to avoid what is canned.  Discussed his medication list, daughter-in-law is helping him with setting up his medications and he is taking those as ordered.  States that the medical supply company set up a new oxygen tank in his and its fairly noisy and he did not get much sleep last evening.  Has upcoming appointments with Dr. Wynetta Emery on May 31 at 245, he sees his kidney doctor on June 2, he will see Darylene Price in the heart failure clinic on June 6.  He states that he will call today and make his follow-up appointment with Dr. Nehemiah Massed.  Pricilla Riffle RN [facility]

## 2021-02-14 DIAGNOSIS — J9611 Chronic respiratory failure with hypoxia: Secondary | ICD-10-CM | POA: Diagnosis not present

## 2021-02-14 DIAGNOSIS — I517 Cardiomegaly: Secondary | ICD-10-CM | POA: Diagnosis not present

## 2021-02-14 DIAGNOSIS — I509 Heart failure, unspecified: Secondary | ICD-10-CM | POA: Diagnosis not present

## 2021-02-14 DIAGNOSIS — Z09 Encounter for follow-up examination after completed treatment for conditions other than malignant neoplasm: Secondary | ICD-10-CM | POA: Diagnosis not present

## 2021-02-14 DIAGNOSIS — N184 Chronic kidney disease, stage 4 (severe): Secondary | ICD-10-CM | POA: Diagnosis not present

## 2021-02-14 DIAGNOSIS — J90 Pleural effusion, not elsewhere classified: Secondary | ICD-10-CM | POA: Diagnosis not present

## 2021-02-14 LAB — SEROTONIN RELEASE ASSAY (SRA)
SRA .2 IU/mL UFH Ser-aCnc: <1 % (ref 0–20)
SRA 100IU/mL UFH Ser-aCnc: <1 % (ref 0–20)

## 2021-02-15 DIAGNOSIS — K219 Gastro-esophageal reflux disease without esophagitis: Secondary | ICD-10-CM | POA: Diagnosis not present

## 2021-02-15 DIAGNOSIS — J918 Pleural effusion in other conditions classified elsewhere: Secondary | ICD-10-CM | POA: Diagnosis not present

## 2021-02-15 DIAGNOSIS — E785 Hyperlipidemia, unspecified: Secondary | ICD-10-CM | POA: Diagnosis not present

## 2021-02-15 DIAGNOSIS — I130 Hypertensive heart and chronic kidney disease with heart failure and stage 1 through stage 4 chronic kidney disease, or unspecified chronic kidney disease: Secondary | ICD-10-CM | POA: Diagnosis not present

## 2021-02-15 DIAGNOSIS — I2510 Atherosclerotic heart disease of native coronary artery without angina pectoris: Secondary | ICD-10-CM | POA: Diagnosis not present

## 2021-02-15 DIAGNOSIS — I5023 Acute on chronic systolic (congestive) heart failure: Secondary | ICD-10-CM | POA: Diagnosis not present

## 2021-02-15 DIAGNOSIS — D472 Monoclonal gammopathy: Secondary | ICD-10-CM | POA: Diagnosis not present

## 2021-02-15 DIAGNOSIS — J9601 Acute respiratory failure with hypoxia: Secondary | ICD-10-CM | POA: Diagnosis not present

## 2021-02-15 DIAGNOSIS — F419 Anxiety disorder, unspecified: Secondary | ICD-10-CM | POA: Diagnosis not present

## 2021-02-15 DIAGNOSIS — D631 Anemia in chronic kidney disease: Secondary | ICD-10-CM | POA: Diagnosis not present

## 2021-02-15 DIAGNOSIS — E7800 Pure hypercholesterolemia, unspecified: Secondary | ICD-10-CM | POA: Diagnosis not present

## 2021-02-15 DIAGNOSIS — I493 Ventricular premature depolarization: Secondary | ICD-10-CM | POA: Diagnosis not present

## 2021-02-15 DIAGNOSIS — N1832 Chronic kidney disease, stage 3b: Secondary | ICD-10-CM | POA: Diagnosis not present

## 2021-02-16 DIAGNOSIS — N184 Chronic kidney disease, stage 4 (severe): Secondary | ICD-10-CM | POA: Diagnosis not present

## 2021-02-16 DIAGNOSIS — I129 Hypertensive chronic kidney disease with stage 1 through stage 4 chronic kidney disease, or unspecified chronic kidney disease: Secondary | ICD-10-CM | POA: Diagnosis not present

## 2021-02-16 DIAGNOSIS — E871 Hypo-osmolality and hyponatremia: Secondary | ICD-10-CM | POA: Diagnosis not present

## 2021-02-16 DIAGNOSIS — N2581 Secondary hyperparathyroidism of renal origin: Secondary | ICD-10-CM | POA: Diagnosis not present

## 2021-02-16 DIAGNOSIS — I5022 Chronic systolic (congestive) heart failure: Secondary | ICD-10-CM | POA: Diagnosis not present

## 2021-02-17 ENCOUNTER — Other Ambulatory Visit: Payer: Self-pay

## 2021-02-17 ENCOUNTER — Ambulatory Visit
Admission: RE | Admit: 2021-02-17 | Discharge: 2021-02-17 | Disposition: A | Discharge status: Home / Self Care | Payer: PPO | Source: Ambulatory Visit | Attending: Nephrology | Admitting: Nephrology

## 2021-02-17 DIAGNOSIS — N183 Chronic kidney disease, stage 3 unspecified: Secondary | ICD-10-CM | POA: Diagnosis not present

## 2021-02-17 DIAGNOSIS — R600 Localized edema: Secondary | ICD-10-CM | POA: Diagnosis not present

## 2021-02-17 LAB — RENAL FUNCTION PANEL
Albumin: 3.3 g/dL — ABNORMAL LOW (ref 3.5–5.0)
Anion gap: 11 (ref 5–15)
BUN: 85 mg/dL — ABNORMAL HIGH (ref 8–23)
CO2: 23 mmol/L (ref 22–32)
Calcium: 8.6 mg/dL — ABNORMAL LOW (ref 8.9–10.3)
Chloride: 98 mmol/L (ref 98–111)
Creatinine, Ser: 3.15 mg/dL — ABNORMAL HIGH (ref 0.61–1.24)
GFR, Estimated: 18 mL/min — ABNORMAL LOW (ref >60)
Glucose, Bld: 94 mg/dL (ref 70–99)
Phosphorus: 4.4 mg/dL (ref 2.5–4.6)
Potassium: 5.1 mmol/L (ref 3.5–5.1)
Sodium: 132 mmol/L — ABNORMAL LOW (ref 135–145)

## 2021-02-17 MED ORDER — FUROSEMIDE 10 MG/ML IJ SOLN: 40.0000 mg | Freq: Once | INTRAMUSCULAR | Status: AC

## 2021-02-17 MED ORDER — FUROSEMIDE 10 MG/ML IJ SOLN
INTRAMUSCULAR | Status: AC
  Administered 2021-02-17: 40 mg via INTRAVENOUS
  Filled 2021-02-17: qty 4

## 2021-02-17 MED ORDER — POTASSIUM CHLORIDE CRYS ER 20 MEQ PO TBCR: 20.0000 meq | EXTENDED_RELEASE_TABLET | Freq: Once | ORAL | Status: AC

## 2021-02-17 MED ORDER — POTASSIUM CHLORIDE CRYS ER 20 MEQ PO TBCR
EXTENDED_RELEASE_TABLET | ORAL | Status: AC
  Administered 2021-02-17: 20 meq via ORAL
  Filled 2021-02-17: qty 2

## 2021-02-17 MED ORDER — SODIUM CHLORIDE FLUSH 0.9 % IV SOLN
INTRAVENOUS | Status: AC
  Filled 2021-02-17: qty 30

## 2021-02-17 MED ORDER — POTASSIUM CHLORIDE CRYS ER 20 MEQ PO TBCR
20.0000 meq | EXTENDED_RELEASE_TABLET | Freq: Once | ORAL | Status: AC
  Administered 2021-02-17: 20 meq via ORAL

## 2021-02-20 ENCOUNTER — Ambulatory Visit
Admission: RE | Admit: 2021-02-20 | Discharge: 2021-02-20 | Disposition: A | Discharge status: Home / Self Care | Payer: PPO | Source: Ambulatory Visit | Attending: Nephrology | Admitting: Nephrology

## 2021-02-20 ENCOUNTER — Other Ambulatory Visit: Payer: Self-pay

## 2021-02-20 ENCOUNTER — Ambulatory Visit: Payer: PPO

## 2021-02-20 DIAGNOSIS — R600 Localized edema: Secondary | ICD-10-CM | POA: Diagnosis not present

## 2021-02-20 DIAGNOSIS — N183 Chronic kidney disease, stage 3 unspecified: Secondary | ICD-10-CM | POA: Diagnosis not present

## 2021-02-20 MED ORDER — POTASSIUM CHLORIDE CRYS ER 20 MEQ PO TBCR: 20.0000 meq | EXTENDED_RELEASE_TABLET | Freq: Once | ORAL | Status: AC

## 2021-02-20 MED ORDER — FUROSEMIDE 10 MG/ML IJ SOLN
INTRAMUSCULAR | Status: AC
  Administered 2021-02-20: 40 mg via INTRAVENOUS
  Filled 2021-02-20: qty 4

## 2021-02-20 MED ORDER — FUROSEMIDE 10 MG/ML IJ SOLN: 40.0000 mg | Freq: Once | INTRAMUSCULAR | Status: DC

## 2021-02-20 MED ORDER — FUROSEMIDE 10 MG/ML IJ SOLN: 40.0000 mg | Freq: Once | INTRAMUSCULAR | Status: AC

## 2021-02-20 MED ORDER — POTASSIUM CHLORIDE CRYS ER 20 MEQ PO TBCR
EXTENDED_RELEASE_TABLET | ORAL | Status: AC
  Administered 2021-02-20: 20 meq via ORAL
  Filled 2021-02-20: qty 1

## 2021-02-20 MED ORDER — SODIUM CHLORIDE FLUSH 0.9 % IV SOLN
INTRAVENOUS | Status: AC
  Filled 2021-02-20: qty 10

## 2021-02-20 NOTE — Discharge Instructions (Signed)
Return on Wednesday at 12:00.

## 2021-02-21 ENCOUNTER — Ambulatory Visit: Payer: PPO | Admitting: Family

## 2021-02-22 ENCOUNTER — Other Ambulatory Visit: Payer: Self-pay

## 2021-02-22 ENCOUNTER — Ambulatory Visit
Admission: RE | Admit: 2021-02-22 | Discharge: 2021-02-22 | Disposition: A | Discharge status: Home / Self Care | Payer: PPO | Source: Ambulatory Visit | Attending: Nephrology | Admitting: Nephrology

## 2021-02-22 DIAGNOSIS — N183 Chronic kidney disease, stage 3 unspecified: Secondary | ICD-10-CM | POA: Insufficient documentation

## 2021-02-22 DIAGNOSIS — R609 Edema, unspecified: Secondary | ICD-10-CM | POA: Insufficient documentation

## 2021-02-22 LAB — RENAL FUNCTION PANEL
Albumin: 3.4 g/dL — ABNORMAL LOW (ref 3.5–5.0)
Anion gap: 10 (ref 5–15)
BUN: 90 mg/dL — ABNORMAL HIGH (ref 8–23)
CO2: 21 mmol/L — ABNORMAL LOW (ref 22–32)
Calcium: 8.9 mg/dL (ref 8.9–10.3)
Chloride: 98 mmol/L (ref 98–111)
Creatinine, Ser: 3.72 mg/dL — ABNORMAL HIGH (ref 0.61–1.24)
GFR, Estimated: 15 mL/min — ABNORMAL LOW (ref >60)
Glucose, Bld: 97 mg/dL (ref 70–99)
Phosphorus: 4.6 mg/dL (ref 2.5–4.6)
Potassium: 6 mmol/L — ABNORMAL HIGH (ref 3.5–5.1)
Sodium: 129 mmol/L — ABNORMAL LOW (ref 135–145)

## 2021-02-22 MED ORDER — FUROSEMIDE 10 MG/ML IJ SOLN
INTRAMUSCULAR | Status: AC
  Filled 2021-02-22: qty 4

## 2021-02-22 MED ORDER — FUROSEMIDE 10 MG/ML IJ SOLN
40.0000 mg | Freq: Once | INTRAMUSCULAR | Status: AC
  Administered 2021-02-22: 40 mg via INTRAVENOUS

## 2021-02-22 MED ORDER — FUROSEMIDE 10 MG/ML IJ SOLN
INTRAMUSCULAR | Status: AC
  Filled 2021-02-22: qty 2

## 2021-02-23 DIAGNOSIS — I10 Essential (primary) hypertension: Secondary | ICD-10-CM | POA: Diagnosis not present

## 2021-02-23 DIAGNOSIS — I700 Atherosclerosis of aorta: Secondary | ICD-10-CM | POA: Diagnosis not present

## 2021-02-23 DIAGNOSIS — I255 Ischemic cardiomyopathy: Secondary | ICD-10-CM | POA: Diagnosis not present

## 2021-02-23 DIAGNOSIS — I2510 Atherosclerotic heart disease of native coronary artery without angina pectoris: Secondary | ICD-10-CM | POA: Diagnosis not present

## 2021-02-23 DIAGNOSIS — I471 Supraventricular tachycardia: Secondary | ICD-10-CM | POA: Diagnosis not present

## 2021-02-23 DIAGNOSIS — I350 Nonrheumatic aortic (valve) stenosis: Secondary | ICD-10-CM | POA: Diagnosis not present

## 2021-02-23 DIAGNOSIS — I6523 Occlusion and stenosis of bilateral carotid arteries: Secondary | ICD-10-CM | POA: Diagnosis not present

## 2021-02-23 LAB — FUNGAL ORGANISM REFLEX

## 2021-02-23 LAB — FUNGUS CULTURE WITH STAIN

## 2021-02-23 LAB — FUNGUS CULTURE RESULT

## 2021-02-24 ENCOUNTER — Other Ambulatory Visit: Payer: Self-pay

## 2021-02-24 ENCOUNTER — Ambulatory Visit
Admission: RE | Admit: 2021-02-24 | Discharge: 2021-02-24 | Disposition: A | Discharge status: Home / Self Care | Payer: PPO | Source: Ambulatory Visit | Attending: Nephrology | Admitting: Nephrology

## 2021-02-24 DIAGNOSIS — N183 Chronic kidney disease, stage 3 unspecified: Secondary | ICD-10-CM | POA: Diagnosis not present

## 2021-02-24 DIAGNOSIS — R600 Localized edema: Secondary | ICD-10-CM | POA: Diagnosis not present

## 2021-02-24 MED ORDER — FUROSEMIDE 10 MG/ML IJ SOLN: 40.0000 mg | Freq: Once | INTRAMUSCULAR | Status: AC

## 2021-02-24 MED ORDER — FUROSEMIDE 10 MG/ML IJ SOLN
INTRAMUSCULAR | Status: AC
  Administered 2021-02-24: 40 mg via INTRAVENOUS
  Filled 2021-02-24: qty 4

## 2021-02-27 ENCOUNTER — Other Ambulatory Visit: Payer: Self-pay

## 2021-02-27 ENCOUNTER — Encounter
Admission: RE | Admit: 2021-02-27 | Discharge: 2021-02-27 | Disposition: A | Discharge status: Home / Self Care | Payer: PPO | Source: Ambulatory Visit | Attending: Nephrology | Admitting: Nephrology

## 2021-02-27 DIAGNOSIS — R4182 Altered mental status, unspecified: Secondary | ICD-10-CM | POA: Diagnosis not present

## 2021-02-27 DIAGNOSIS — Z885 Allergy status to narcotic agent status: Secondary | ICD-10-CM | POA: Insufficient documentation

## 2021-02-27 DIAGNOSIS — S31809A Unspecified open wound of unspecified buttock, initial encounter: Secondary | ICD-10-CM | POA: Diagnosis not present

## 2021-02-27 LAB — FUNGUS CULTURE WITH STAIN

## 2021-02-27 LAB — FUNGAL ORGANISM REFLEX

## 2021-02-27 LAB — FUNGUS CULTURE RESULT

## 2021-02-27 MED ORDER — FUROSEMIDE 10 MG/ML IJ SOLN
INTRAMUSCULAR | Status: AC
  Administered 2021-02-27: 40 mg via INTRAVENOUS
  Filled 2021-02-27: qty 4

## 2021-02-27 MED ORDER — POTASSIUM CHLORIDE CRYS ER 20 MEQ PO TBCR
EXTENDED_RELEASE_TABLET | ORAL | Status: AC
  Filled 2021-02-27: qty 1

## 2021-02-27 MED ORDER — POTASSIUM CHLORIDE CRYS ER 20 MEQ PO TBCR: 20.0000 meq | EXTENDED_RELEASE_TABLET | Freq: Once | ORAL | Status: DC

## 2021-02-27 MED ORDER — FUROSEMIDE 10 MG/ML IJ SOLN: 40.0000 mg | Freq: Once | INTRAMUSCULAR | Status: AC

## 2021-02-27 NOTE — Discharge Instructions (Signed)
Results for DAVIS, AMBROSINI (MRN [id number]) as of 02/27/2021 12:48  Ref. Range 02/22/2021 12:45  Sodium Latest Ref Range: 135 - 145 mmol/L 129 (L)  Potassium Latest Ref Range: 3.5 - 5.1 mmol/L 6.0 (H)  Chloride Latest Ref Range: 98 - 111 mmol/L 98  CO2 Latest Ref Range: 22 - 32 mmol/L 21 (L)  Glucose Latest Ref Range: 70 - 99 mg/dL 97  BUN Latest Ref Range: 8 - 23 mg/dL 90 (H)  Creatinine Latest Ref Range: 0.61 - 1.24 mg/dL 3.72 (H)  Calcium Latest Ref Range: 8.9 - 10.3 mg/dL 8.9  Anion gap Latest Ref Range: 5 - 15  10  Phosphorus Latest Ref Range: 2.5 - 4.6 mg/dL 4.6  Albumin Latest Ref Range: 3.5 - 5.0 g/dL 3.4 (L)  GFR, Estimated Latest Ref Range: >60 mL/min 15 (L)  Next appt. Wednesday at 12:30

## 2021-03-01 ENCOUNTER — Other Ambulatory Visit: Payer: Self-pay

## 2021-03-01 ENCOUNTER — Ambulatory Visit
Admission: RE | Admit: 2021-03-01 | Discharge: 2021-03-01 | Disposition: A | Discharge status: Home / Self Care | Payer: PPO | Source: Ambulatory Visit | Attending: Student in an Organized Health Care Education/Training Program | Admitting: Student in an Organized Health Care Education/Training Program

## 2021-03-01 ENCOUNTER — Encounter: Payer: PPO | Admitting: Internal Medicine

## 2021-03-01 DIAGNOSIS — S31809A Unspecified open wound of unspecified buttock, initial encounter: Secondary | ICD-10-CM | POA: Diagnosis not present

## 2021-03-01 DIAGNOSIS — R609 Edema, unspecified: Secondary | ICD-10-CM | POA: Diagnosis not present

## 2021-03-01 DIAGNOSIS — L89153 Pressure ulcer of sacral region, stage 3: Secondary | ICD-10-CM | POA: Diagnosis not present

## 2021-03-01 DIAGNOSIS — N183 Chronic kidney disease, stage 3 unspecified: Secondary | ICD-10-CM | POA: Diagnosis not present

## 2021-03-01 LAB — RENAL FUNCTION PANEL
Albumin: 3.3 g/dL — ABNORMAL LOW (ref 3.5–5.0)
Anion gap: 7 (ref 5–15)
BUN: 62 mg/dL — ABNORMAL HIGH (ref 8–23)
CO2: 20 mmol/L — ABNORMAL LOW (ref 22–32)
Calcium: 8.5 mg/dL — ABNORMAL LOW (ref 8.9–10.3)
Chloride: 103 mmol/L (ref 98–111)
Creatinine, Ser: 2.46 mg/dL — ABNORMAL HIGH (ref 0.61–1.24)
GFR, Estimated: 25 mL/min — ABNORMAL LOW (ref >60)
Glucose, Bld: 104 mg/dL — ABNORMAL HIGH (ref 70–99)
Phosphorus: 3.4 mg/dL (ref 2.5–4.6)
Potassium: 3.3 mmol/L — ABNORMAL LOW (ref 3.5–5.1)
Sodium: 130 mmol/L — ABNORMAL LOW (ref 135–145)

## 2021-03-01 MED ORDER — FUROSEMIDE 10 MG/ML IJ SOLN: 40.0000 mg | Freq: Once | INTRAMUSCULAR | Status: AC

## 2021-03-01 MED ORDER — FUROSEMIDE 10 MG/ML IJ SOLN
INTRAMUSCULAR | Status: AC
  Administered 2021-03-01: 40 mg via INTRAVENOUS
  Filled 2021-03-01: qty 4

## 2021-03-01 MED ORDER — POTASSIUM CHLORIDE CRYS ER 20 MEQ PO TBCR
20.0000 meq | EXTENDED_RELEASE_TABLET | Freq: Once | ORAL | Status: AC
  Administered 2021-03-01: 20 meq via ORAL

## 2021-03-01 MED ORDER — POTASSIUM CHLORIDE CRYS ER 20 MEQ PO TBCR
EXTENDED_RELEASE_TABLET | ORAL | Status: AC
  Filled 2021-03-01: qty 1

## 2021-03-01 NOTE — Progress Notes (Signed)
[name], [name] ([id number]) Visit Report for 03/01/2021 Abuse/Suicide Risk Screen Details Patient Name: [name], [name]. Date of Service: 03/01/2021 2:30 PM Medical Record Number: [id number] Patient Account Number: [id number] Date of Birth/Sex: 01/10/1931 (87 y.o. M) Treating RN: Carlene Coria Primary Care Arleen Bar: Harrel Lemon Other Clinician: Referring Daria Mcmeekin: Harrel Lemon Treating Kwinton Maahs/Extender: Tito Dine in Treatment: 0 Abuse/Suicide Risk Screen Items Answer ABUSE RISK SCREEN: Has anyone close to you tried to hurt or harm you recentlyo No Do you feel uncomfortable with anyone in your familyo No Has anyone forced you do things that you didnot want to doo No Electronic Signature(s) Signed: 03/01/2021 3:34:38 PM By: Carlene Coria RN Entered By: Carlene Coria on 03/01/2021 14:45:29 Barraco, [name] ([id number]) -------------------------------------------------------------------------------- Activities of Daily Living Details Patient Name: James Prude T. Date of Service: 03/01/2021 2:30 PM Medical Record Number: [id number] Patient Account Number: [id number] Date of Birth/Sex: 1931/03/21 (87 y.o. M) Treating RN: Carlene Coria Primary Care Tenisha Fleece: Harrel Lemon Other Clinician: Referring Jayleena Stille: Harrel Lemon Treating Tyquavious Gamel/Extender: Tito Dine in Treatment: 0 Activities of Daily Living Items Answer Activities of Daily Living (Please select one for each item) Drive Automobile Not Able Take Medications Need Assistance Use Telephone Not Able Care for Appearance Need Assistance Use Toilet Need Assistance Bath / Shower Need Assistance Dress Self Need Assistance Feed Self Completely Able Walk Need Assistance Get In / Out Bed Need Assistance Housework Need Assistance Prepare Meals Need Assistance Handle Money Need Assistance Shop for Self Need Assistance Electronic Signature(s) Signed: 03/01/2021 3:34:38 PM By: Carlene Coria RN Entered  By: Carlene Coria on 03/01/2021 14:47:18 Rubert, [name] ([id number]) -------------------------------------------------------------------------------- Education Screening Details Patient Name: James Prude T. Date of Service: 03/01/2021 2:30 PM Medical Record Number: [id number] Patient Account Number: [id number] Date of Birth/Sex: [birth year] (87 y.o. M) Treating RN: Carlene Coria Primary Care Ciaira Natividad: Harrel Lemon Other Clinician: Referring Janvi Ammar: Harrel Lemon Treating Correne Lalani/Extender: Tito Dine in Treatment: 0 Learning Preferences/Education Level/Primary Language Learning Preference: Explanation Highest Education Level: High School Preferred Language: English Cognitive Barrier Language Barrier: No Translator Needed: No Memory Deficit: No Emotional Barrier: No Cultural/Religious Beliefs Affecting Medical Care: No Physical Barrier Impaired Vision: Yes Glasses Impaired Hearing: Yes Hearing Aid Decreased Hand dexterity: No Knowledge/Comprehension Knowledge Level: Medium Comprehension Level: High Ability to understand written instructions: High Ability to understand verbal instructions: High Motivation Anxiety Level: Anxious Cooperation: Cooperative Education Importance: Acknowledges Need Interest in Health Problems: Asks Questions Perception: Coherent Willingness to Engage in Self-Management High Activities: Readiness to Engage in Self-Management High Activities: Electronic Signature(s) Signed: 03/01/2021 3:34:38 PM By: Carlene Coria RN Entered By: Carlene Coria on 03/01/2021 14:48:05 Segovia, [name] -------------------------------------------------------------------------------- Fall Risk Assessment Details Patient Name: James Prude T. Date of Service: 03/01/2021 2:30 PM Medical Record Number: [id number] Patient Account Number: [id number] Date of Birth/Sex: Mar 11, 1931 (87 y.o. M) Treating RN: Carlene Coria Primary Care Danford Tat:  Harrel Lemon Other Clinician: Referring Melinda Gwinner: Harrel Lemon Treating Desaray Marschner/Extender: Tito Dine in Treatment: 0 Fall Risk Assessment Items Have you had 2 or more falls in the last 12 monthso 0 No Have you had any fall that resulted in injury in the last 12 monthso 0 No FALLS RISK SCREEN History of falling - immediate or within 3 months 0 No Secondary diagnosis (Do you have 2 or more medical diagnoseso) 0 No Ambulatory aid None/bed rest/wheelchair/nurse 0 No Crutches/cane/walker 0 No Furniture 0 No Intravenous therapy Access/Saline/Heparin Lock 0 No Gait/Transferring Normal/ bed rest/ wheelchair 0 No Weak (short  steps with or without shuffle, stooped but able to lift head while walking, may 0 No seek support from furniture) Impaired (short steps with shuffle, may have difficulty arising from chair, head down, impaired 0 No balance) Mental Status Oriented to own ability 0 No Electronic Signature(s) Signed: 03/01/2021 3:34:38 PM By: Carlene Coria RN Entered By: Carlene Coria on 03/01/2021 14:48:12 Valtierra, [name] ([id number]) -------------------------------------------------------------------------------- Foot Assessment Details Patient Name: James Prude T. Date of Service: 03/01/2021 2:30 PM Medical Record Number: [id number] Patient Account Number: [id number] Date of Birth/Sex: [birth year] (87 y.o. M) Treating RN: Carlene Coria Primary Care Fatina Sprankle: Harrel Lemon Other Clinician: Referring Tyquisha Sharps: Harrel Lemon Treating Kastiel Simonian/Extender: Tito Dine in Treatment: 0 Foot Assessment Items Site Locations + = Sensation present, - = Sensation absent, C = Callus, U = Ulcer R = Redness, W = Warmth, M = Maceration, PU = Pre-ulcerative lesion F = Fissure, S = Swelling, D = Dryness Assessment Right: Left: Other Deformity: No No Prior Foot Ulcer: No No Prior Amputation: No No Charcot Joint: No No Ambulatory Status: Ambulatory With  Help Assistance Device: Walker Gait: Steady Electronic Signature(s) Signed: 03/01/2021 3:34:38 PM By: Carlene Coria RN Entered By: Carlene Coria on 03/01/2021 14:48:48 Cipriani, [name] ([id number]) -------------------------------------------------------------------------------- Nutrition Risk Screening Details Patient Name: James Prude T. Date of Service: 03/01/2021 2:30 PM Medical Record Number: [id number] Patient Account Number: [id number] Date of Birth/Sex: [birth year] (87 y.o. M) Treating RN: Carlene Coria Primary Care Yittel Emrich: Harrel Lemon Other Clinician: Referring Curley Fayette: Harrel Lemon Treating Markayla Reichart/Extender: Tito Dine in Treatment: 0 Height (in): 71 Weight (lbs): 164 Body Mass Index (BMI): 22.9 Nutrition Risk Screening Items Score Screening NUTRITION RISK SCREEN: I have an illness or condition that made me change the kind and/or amount of food I eat 0 No I eat fewer than two meals per day 0 No I eat few fruits and vegetables, or milk products 0 No I have three or more drinks of beer, liquor or wine almost every day 0 No I have tooth or mouth problems that make it hard for me to eat 0 No I don't always have enough money to buy the food I need 0 No I eat alone most of the time 0 No I take three or more different prescribed or over-the-counter drugs a day 1 Yes Without wanting to, I have lost or gained 10 pounds in the last six months 0 No I am not always physically able to shop, cook and/or feed myself 2 Yes Nutrition Protocols Good Risk Protocol Moderate Risk Protocol 0 Provide education on nutrition High Risk Proctocol Risk Level: Moderate Risk Score: 3 Electronic Signature(s) Signed: 03/01/2021 3:34:38 PM By: Carlene Coria RN Entered By: Carlene Coria on 03/01/2021 14:48:26

## 2021-03-02 NOTE — Progress Notes (Signed)
CLAYVON, PARLETT ([id number]) Visit Report for 03/01/2021 Debridement Details Patient Name: [name], [name]. Date of Service: 03/01/2021 2:30 PM Medical Record Number: [id number] Patient Account Number: [id number] Date of Birth/Sex: 06-09-1931 (87 y.o. M) Treating RN: Cornell Barman Primary Care Provider: Harrel Lemon Other Clinician: Referring Provider: Harrel Lemon Treating Provider/Extender: Tito Dine in Treatment: 0 Debridement Performed for Wound #3 Proximal Sacrum Assessment: Performed By: Physician Ricard Dillon, MD Debridement Type: Chemical/Enzymatic/Mechanical Agent Used: gauze and saline Level of Consciousness (Pre- Awake and Alert procedure): Pre-procedure Verification/Time Out Yes - 15:17 Taken: Instrument: Other : saline and gauze Bleeding: Minimum Hemostasis Achieved: Pressure Response to Treatment: Procedure was tolerated well Level of Consciousness (Post- Awake and Alert procedure): Post Debridement Measurements of Total Wound Length: (cm) 0.6 Stage: Category/Stage III Width: (cm) 0.7 Depth: (cm) 0.1 Volume: (cm) 0.033 Character of Wound/Ulcer Post Debridement: Stable Post Procedure Diagnosis Same as Pre-procedure Electronic Signature(s) Signed: 03/01/2021 4:10:05 PM By: Linton Ham MD Signed: 03/01/2021 5:10:04 PM By: Gretta Cool, BSN, RN, CWS, Kim RN, BSN Entered By: Linton Ham on 03/01/2021 15:32:28 [name] ([id number]) -------------------------------------------------------------------------------- Debridement Details Patient Name: James Prude T. Date of Service: 03/01/2021 2:30 PM Medical Record Number: [id number] Patient Account Number: [id number] Date of Birth/Sex: Jul 17, 1931 (87 y.o. M) Treating RN: Cornell Barman Primary Care Provider: Harrel Lemon Other Clinician: Referring Provider: Harrel Lemon Treating Provider/Extender: Tito Dine in Treatment: 0 Debridement Performed for Wound #4 Distal  Sacrum Assessment: Performed By: Physician Ricard Dillon, MD Debridement Type: Chemical/Enzymatic/Mechanical Agent Used: gauze and saline Level of Consciousness (Pre- Awake and Alert procedure): Pre-procedure Verification/Time Out Yes - 15:17 Taken: Instrument: Other : saline and gauze Bleeding: Minimum Hemostasis Achieved: Pressure Response to Treatment: Procedure was tolerated well Level of Consciousness (Post- Awake and Alert procedure): Post Debridement Measurements of Total Wound Length: (cm) 2.5 Stage: Category/Stage III Width: (cm) 2.1 Depth: (cm) 0.1 Volume: (cm) 0.412 Character of Wound/Ulcer Post Debridement: Stable Post Procedure Diagnosis Same as Pre-procedure Electronic Signature(s) Signed: 03/01/2021 4:10:05 PM By: Linton Ham MD Signed: 03/01/2021 5:10:04 PM By: Gretta Cool, BSN, RN, CWS, Kim RN, BSN Entered By: Linton Ham on 03/01/2021 15:32:37 [name], [name] ([id number]) -------------------------------------------------------------------------------- HPI Details Patient Name: James Prude T. Date of Service: 03/01/2021 2:30 PM Medical Record Number: [id number] Patient Account Number: [id number] Date of Birth/Sex: 08/01/1931 (87 y.o. M) Treating RN: Cornell Barman Primary Care Provider: Harrel Lemon Other Clinician: Referring Provider: Harrel Lemon Treating Provider/Extender: Tito Dine in Treatment: 0 History of Present Illness HPI Description: ADMISSION 08/31/2020 This is an 87 year old independent man who lives at home on his own. His problem started in August when he fell and fractured his right femur. He required an ORIF. He was sent to a rehab facility. At some point in this timeframe he developed bilateral pressure ulcers on his buttock. He was seen by dermatology Dr. Nehemiah Massed on 06/27/2020 noting the pressure injuries of his bilateral buttocks at the time treatment order for salicylic acid 3% ointment. Saw his primary  doctor in the same timeframe. He is currently using a compounded ointment to the area including iodoquinol 1% / 0.25% hydrocortisone/2% niacinamide. I am not really sure how this is been progressing. The wounds are stage II on the bilateral buttocks surrounding the coccyx area. The patient has been changing his dressing himself. He is a very active man in fact had another fall in November suffering a periprosthetic hip fracture requiring additional pinning. He says actually after the second surgery he improved  and he no longer has as much pain. The patient is here with his daughter. He also has a son that lives nearby. Past medical history includes systolic congestive heart failure, ischemic cardiomyopathy, right femur fracture and subsequent periprosthetic fracture as described, chronic kidney disease stage III, MGUS and abdominal aortic aneurysm 12/29; since the patient was last here he was apparently admitted with congestive heart failure. He was not given any wound care supplies he was discharged on Christmas day he apparently called here to get [name] to some orders and more of the collagen and apparently we did so but none never arrived. His son has been applying topical antibiotics. There is not too much change in the wound area on either side 09/28/2020; 2 small open areas on the mirror-image of the buttock bilaterally just below the coccyx level. This is in the fold of the gluteal cleft. Both surface areas are a lot smaller we have been using silver collagen and foam dressing 10/19/2020; 2 small areas in the mirror image of the buttock bilaterally just below the coccyx level in the gluteal cleft. These are actually totally epithelialized today which is good to see READMISSION 03/01/2021 This is a patient we had earlier this year with 2 small mirror-image wounds on his bilateral buttocks in close proximity to the gluteal cleft around his sacrum. We managed to get these to close over with meticulous  pressure relief and collagen. He tells me everything was going well until he was hospitalized from 01/31/2021 through 02/08/2021 with congestive heart failure. He required IV diuretics. He was discharged on chronic oxygen. They have been using some collagen that they had leftover from last time but ran out of supplies. Once again the patient has 3 small areas all in close proximity to the lower sacrum/coccyx and the gluteal cleft. 2 on the left and a small area on the right. Although these have clean surfaces. The larger area on the left almost is shaped like an S, not really sure how he would have done this unless he lay on an oxygen cord in the hospital. Past medical history is essentially unchanged from above except that he now wears oxygen Electronic Signature(s) Signed: 03/01/2021 4:10:05 PM By: Linton Ham MD Entered By: Linton Ham on 03/01/2021 15:41:44 [name], [name] ([id number]) -------------------------------------------------------------------------------- Physical Exam Details Patient Name: James Prude T. Date of Service: 03/01/2021 2:30 PM Medical Record Number: [id number] Patient Account Number: [id number] Date of Birth/Sex: 1930-11-25 (87 y.o. M) Treating RN: Cornell Barman Primary Care Provider: Harrel Lemon Other Clinician: Referring Provider: Harrel Lemon Treating Provider/Extender: Tito Dine in Treatment: 0 Constitutional Sitting or standing Blood Pressure is within target range for patient.. Pulse regular and within target range for patient.Marland Kitchen Respirations regular, non- labored and within target range.. Temperature is normal and within the target range for the patient.Marland Kitchen appears in no distress but somewhat frail. Respiratory Respiratory effort is easy and symmetric bilaterally. Rate is normal at rest and on room air.. Probably bronchial at both bases suggestive of pleural fluid. Cardiovascular Heart sounds are distant. Notes Wound exam; lower  sacrum/coccyx. On the surrounding buttock areas in close proximity to small areas on the left and 1 on the right. All of these look as though they have healthy surfaces. There is no evidence of infection no debridement was required Electronic Signature(s) Signed: 03/01/2021 4:10:05 PM By: Linton Ham MD Entered By: Linton Ham on 03/01/2021 15:43:27 [name], [name] ([id number]) -------------------------------------------------------------------------------- Physician Orders Details Patient Name: James Prude T. Date  of Service: 03/01/2021 2:30 PM Medical Record Number: [id number] Patient Account Number: [id number] Date of Birth/Sex: 01/09/1931 (87 y.o. M) Treating RN: Cornell Barman Primary Care Provider: Harrel Lemon Other Clinician: Referring Provider: Harrel Lemon Treating Provider/Extender: Tito Dine in Treatment: 0 Verbal / Phone Orders: No Diagnosis Coding Follow-up Appointments o Return Appointment in 2 weeks. Bathing/ Shower/ Hygiene o May shower with wound dressing protected with water repellent cover or cast protector. Off-Loading o Turn and reposition every 2 hours - Keep pressure off of wounded areas Wound Treatment Wound #3 - Sacrum Wound Laterality: Proximal Primary Dressing: Prisma 4.34 (in) (DME) (Generic) 3 x Per Week/30 Days Discharge Instructions: Moisten w/normal saline or sterile water; Cover wound as directed. Do not remove from wound bed. Secondary Dressing: Mepilex Border Flex, 4x4 (in/in) (DME) (Generic) 3 x Per Week/30 Days Discharge Instructions: Apply to wound as directed. Do not cut. Wound #4 - Sacrum Wound Laterality: Distal Primary Dressing: Prisma 4.34 (in) (DME) (Generic) 3 x Per Week/30 Days Discharge Instructions: Moisten w/normal saline or sterile water; Cover wound as directed. Do not remove from wound bed. Secondary Dressing: Mepilex Border Flex, 4x4 (in/in) (DME) (Generic) 3 x Per Week/30 Days Discharge Instructions:  Apply to wound as directed. Do not cut. Electronic Signature(s) Signed: 03/01/2021 4:10:05 PM By: Linton Ham MD Signed: 03/01/2021 5:10:04 PM By: Gretta Cool, BSN, RN, CWS, Kim RN, BSN Entered By: Gretta Cool, BSN, RN, CWS, Kim on 03/01/2021 15:25:13 [name], [name] ([id number]) -------------------------------------------------------------------------------- Problem List Details Patient Name: TYJUAN, DEMETRO. Date of Service: 03/01/2021 2:30 PM Medical Record Number: [id number] Patient Account Number: [id number] Date of Birth/Sex: 02-May-1931 (87 y.o. M) Treating RN: Cornell Barman Primary Care Provider: Harrel Lemon Other Clinician: Referring Provider: Harrel Lemon Treating Provider/Extender: Tito Dine in Treatment: 0 Active Problems ICD-10 Encounter Code Description Active Date MDM Diagnosis L89.152 Pressure ulcer of sacral region, stage 2 03/01/2021 No Yes Inactive Problems Resolved Problems Electronic Signature(s) Signed: 03/01/2021 4:10:05 PM By: Linton Ham MD Entered By: Linton Ham on 03/01/2021 15:25:05 [name], [name] ([id number]) -------------------------------------------------------------------------------- Progress Note Details Patient Name: James Prude T. Date of Service: 03/01/2021 2:30 PM Medical Record Number: [id number] Patient Account Number: [id number] Date of Birth/Sex: 24-Apr-1931 (87 y.o. M) Treating RN: Cornell Barman Primary Care Provider: Harrel Lemon Other Clinician: Referring Provider: Harrel Lemon Treating Provider/Extender: Tito Dine in Treatment: 0 Subjective History of Present Illness (HPI) ADMISSION 08/31/2020 This is an 87 year old independent man who lives at home on his own. His problem started in August when he fell and fractured his right femur. He required an ORIF. He was sent to a rehab facility. At some point in this timeframe he developed bilateral pressure ulcers on his buttock. He was seen by dermatology  Dr. Nehemiah Massed on 06/27/2020 noting the pressure injuries of his bilateral buttocks at the time treatment order for salicylic acid 3% ointment. Saw his primary doctor in the same timeframe. He is currently using a compounded ointment to the area including iodoquinol 1% / 0.25% hydrocortisone/2% niacinamide. I am not really sure how this is been progressing. The wounds are stage II on the bilateral buttocks surrounding the coccyx area. The patient has been changing his dressing himself. He is a very active man in fact had another fall in November suffering a periprosthetic hip fracture requiring additional pinning. He says actually after the second surgery he improved and he no longer has as much pain. The patient is here with his daughter. He also has a son that  lives nearby. Past medical history includes systolic congestive heart failure, ischemic cardiomyopathy, right femur fracture and subsequent periprosthetic fracture as described, chronic kidney disease stage III, MGUS and abdominal aortic aneurysm 12/29; since the patient was last here he was apparently admitted with congestive heart failure. He was not given any wound care supplies he was discharged on Christmas day he apparently called here to get [name] to some orders and more of the collagen and apparently we did so but none never arrived. His son has been applying topical antibiotics. There is not too much change in the wound area on either side 09/28/2020; 2 small open areas on the mirror-image of the buttock bilaterally just below the coccyx level. This is in the fold of the gluteal cleft. Both surface areas are a lot smaller we have been using silver collagen and foam dressing 10/19/2020; 2 small areas in the mirror image of the buttock bilaterally just below the coccyx level in the gluteal cleft. These are actually totally epithelialized today which is good to see READMISSION 03/01/2021 This is a patient we had earlier this year with 2 small  mirror-image wounds on his bilateral buttocks in close proximity to the gluteal cleft around his sacrum. We managed to get these to close over with meticulous pressure relief and collagen. He tells me everything was going well until he was hospitalized from 01/31/2021 through 02/08/2021 with congestive heart failure. He required IV diuretics. He was discharged on chronic oxygen. They have been using some collagen that they had leftover from last time but ran out of supplies. Once again the patient has 3 small areas all in close proximity to the lower sacrum/coccyx and the gluteal cleft. 2 on the left and a small area on the right. Although these have clean surfaces. The larger area on the left almost is shaped like an S, not really sure how he would have done this unless he lay on an oxygen cord in the hospital. Past medical history is essentially unchanged from above except that he now wears oxygen Patient History Information obtained from Patient. Allergies morphine (Severity: Severe, Reaction: anaphylaxis), oxycodone, carvedilol (Reaction: dizziness), Iodinated Contrast Media (Reaction: rash), lisinopril (Severity: Mild, Reaction: cough) Family History Diabetes - Mother, No family history of Cancer, Heart Disease, Hereditary Spherocytosis, Hypertension, Kidney Disease, Lung Disease, Seizures, Stroke, Thyroid Problems, Tuberculosis. Social History Former smoker - quit 57 years ago, Alcohol Use - Rarely, Drug Use - No History, Caffeine Use - Daily. Medical History Eyes Denies history of Cataracts, Glaucoma, Optic Neuritis Ear/Nose/Mouth/Throat Denies history of Chronic sinus problems/congestion, Middle ear problems Hematologic/Lymphatic Denies history of Anemia, Hemophilia, Human Immunodeficiency Virus, Lymphedema, Sickle Cell Disease Respiratory Denies history of Aspiration, Asthma, Chronic Obstructive Pulmonary Disease (COPD), Pneumothorax, Sleep Apnea,  Tuberculosis Cardiovascular Patient has history of Coronary Artery Disease, Hypertension [name], James T. ([id number]) Denies history of Angina, Arrhythmia, Congestive Heart Failure, Deep Vein Thrombosis, Hypotension, Myocardial Infarction, Peripheral Arterial Disease, Peripheral Venous Disease, Phlebitis, Vasculitis Gastrointestinal Denies history of Cirrhosis , Colitis, Crohn s, Hepatitis A, Hepatitis B, Hepatitis C Endocrine Denies history of Type I Diabetes, Type II Diabetes Immunological Denies history of Lupus Erythematosus, Raynaud s, Scleroderma Integumentary (Skin) Patient has history of History of pressure wounds Denies history of History of Burn Musculoskeletal Denies history of Gout, Rheumatoid Arthritis, Osteoarthritis, Osteomyelitis Neurologic Denies history of Dementia, Neuropathy, Quadriplegia, Paraplegia, Seizure Disorder Psychiatric Denies history of Anorexia/bulimia, Confinement Anxiety Medical And Surgical History Notes Genitourinary stage 3 chronic kidney disease Objective Constitutional Sitting or standing Blood  Pressure is within target range for patient.. Pulse regular and within target range for patient.Marland Kitchen Respirations regular, non- labored and within target range.. Temperature is normal and within the target range for the patient.Marland Kitchen appears in no distress but somewhat frail. Vitals Time Taken: 2:44 PM, Height: 71 in, Source: Stated, Weight: 164 lbs, Source: Stated, BMI: 22.9, Temperature: 97.8 F, Pulse: 71 bpm, Respiratory Rate: 18 breaths/min, Blood Pressure: 115/71 mmHg. Respiratory Respiratory effort is easy and symmetric bilaterally. Rate is normal at rest and on room air.. Probably bronchial at both bases suggestive of pleural fluid. Cardiovascular Heart sounds are distant. General Notes: Wound exam; lower sacrum/coccyx. On the surrounding buttock areas in close proximity to small areas on the left and 1 on the right. All of these look as though they  have healthy surfaces. There is no evidence of infection no debridement was required Integumentary (Hair, Skin) Wound #3 status is Open. Original cause of wound was Gradually Appeared. The date acquired was: 02/12/2021. The wound is located on the Proximal Sacrum. The wound measures 0.6cm length x 0.7cm width x 0.1cm depth; 0.33cm^2 area and 0.033cm^3 volume. There is Fat Layer (Subcutaneous Tissue) exposed. There is no tunneling or undermining noted. There is a medium amount of serosanguineous drainage noted. There is medium (34-66%) pink granulation within the wound bed. There is a medium (34-66%) amount of necrotic tissue within the wound bed including Adherent Slough. Wound #4 status is Open. Original cause of wound was Gradually Appeared. The date acquired was: 02/12/2021. The wound is located on the Distal Sacrum. The wound measures 2.5cm length x 2.1cm width x 0.1cm depth; 4.123cm^2 area and 0.412cm^3 volume. There is Fat Layer (Subcutaneous Tissue) exposed. There is no tunneling or undermining noted. There is a medium amount of serosanguineous drainage noted. There is medium (34-66%) pink granulation within the wound bed. There is a medium (34-66%) amount of necrotic tissue within the wound bed including Adherent Slough. Assessment Active Problems ICD-10 Pressure ulcer of sacral region, stage 2 [name], James T. ([id number]) Procedures Wound #3 Pre-procedure diagnosis of Wound #3 is a Pressure Ulcer located on the Proximal Sacrum . There was a Chemical/Enzymatic/Mechanical debridement performed by Ricard Dillon, MD. With the following instrument(s): saline and gauze. Other agent used was gauze and saline. A time out was conducted at 15:17, prior to the start of the procedure. A Minimum amount of bleeding was controlled with Pressure. The procedure was tolerated well. Post Debridement Measurements: 0.6cm length x 0.7cm width x 0.1cm depth; 0.033cm^3 volume. Post debridement Stage noted  as Category/Stage III. Character of Wound/Ulcer Post Debridement is stable. Post procedure Diagnosis Wound #3: Same as Pre-Procedure Wound #4 Pre-procedure diagnosis of Wound #4 is a Pressure Ulcer located on the Distal Sacrum . There was a Chemical/Enzymatic/Mechanical debridement performed by Ricard Dillon, MD. With the following instrument(s): saline and gauze. Other agent used was gauze and saline. A time out was conducted at 15:17, prior to the start of the procedure. A Minimum amount of bleeding was controlled with Pressure. The procedure was tolerated well. Post Debridement Measurements: 2.5cm length x 2.1cm width x 0.1cm depth; 0.412cm^3 volume. Post debridement Stage noted as Category/Stage III. Character of Wound/Ulcer Post Debridement is stable. Post procedure Diagnosis Wound #4: Same as Pre-Procedure Plan Follow-up Appointments: Return Appointment in 2 weeks. Bathing/ Shower/ Hygiene: May shower with wound dressing protected with water repellent cover or cast protector. Off-Loading: Turn and reposition every 2 hours - Keep pressure off of wounded areas WOUND #3: -  Sacrum Wound Laterality: Proximal Primary Dressing: Prisma 4.34 (in) (DME) (Generic) 3 x Per Week/30 Days Discharge Instructions: Moisten w/normal saline or sterile water; Cover wound as directed. Do not remove from wound bed. Secondary Dressing: Mepilex Border Flex, 4x4 (in/in) (DME) (Generic) 3 x Per Week/30 Days Discharge Instructions: Apply to wound as directed. Do not cut. WOUND #4: - Sacrum Wound Laterality: Distal Primary Dressing: Prisma 4.34 (in) (DME) (Generic) 3 x Per Week/30 Days Discharge Instructions: Moisten w/normal saline or sterile water; Cover wound as directed. Do not remove from wound bed. Secondary Dressing: Mepilex Border Flex, 4x4 (in/in) (DME) (Generic) 3 x Per Week/30 Days Discharge Instructions: Apply to wound as directed. Do not cut. 1. We will dress this area with moistened collagen  and a border covering. He seemed to do well with this last time 2. Again we went over the issue of offloading this area. He spends most of his time sitting although he does walk in his home. He has a recliner and spends at least part of the night in the recliner on many occasions. We advised him when he is sitting to take the weight on his backs of his thighs and over his ischial tuberosities rather than slouching back on his lower back is difficult as this may be. 3. We had good success with this last time and he seemed to be compliant. We will see him back in 2 weeks 4. I saw no convincing evidence of congestive heart failure at the bedside I spent 30 minutes in review of this patient's past medical history, face-to-face evaluation and preparation of this record Electronic Signature(s) Signed: 03/01/2021 4:10:05 PM By: Linton Ham MD Entered By: Linton Ham on 03/01/2021 15:46:53 [name], [name] ([id number]) -------------------------------------------------------------------------------- ROS/PFSH Details Patient Name: James Prude T. Date of Service: 03/01/2021 2:30 PM Medical Record Number: [id number] Patient Account Number: [id number] Date of Birth/Sex: 09/11/1931 (87 y.o. M) Treating RN: Carlene Coria Primary Care Provider: Harrel Lemon Other Clinician: Referring Provider: Harrel Lemon Treating Provider/Extender: Tito Dine in Treatment: 0 Information Obtained From Patient Eyes Medical History: Negative for: Cataracts; Glaucoma; Optic Neuritis Ear/Nose/Mouth/Throat Medical History: Negative for: Chronic sinus problems/congestion; Middle ear problems Hematologic/Lymphatic Medical History: Negative for: Anemia; Hemophilia; Human Immunodeficiency Virus; Lymphedema; Sickle Cell Disease Respiratory Medical History: Negative for: Aspiration; Asthma; Chronic Obstructive Pulmonary Disease (COPD); Pneumothorax; Sleep Apnea; Tuberculosis Cardiovascular Medical  History: Positive for: Coronary Artery Disease; Hypertension Negative for: Angina; Arrhythmia; Congestive Heart Failure; Deep Vein Thrombosis; Hypotension; Myocardial Infarction; Peripheral Arterial Disease; Peripheral Venous Disease; Phlebitis; Vasculitis Gastrointestinal Medical History: Negative for: Cirrhosis ; Colitis; Crohnos; Hepatitis A; Hepatitis B; Hepatitis C Endocrine Medical History: Negative for: Type I Diabetes; Type II Diabetes Genitourinary Medical History: Past Medical History Notes: stage 3 chronic kidney disease Immunological Medical History: Negative for: Lupus Erythematosus; Raynaudos; Scleroderma Integumentary (Skin) Medical History: Positive for: History of pressure wounds Negative for: History of Burn Musculoskeletal [name], [name] ([id number]) Medical History: Negative for: Gout; Rheumatoid Arthritis; Osteoarthritis; Osteomyelitis Neurologic Medical History: Negative for: Dementia; Neuropathy; Quadriplegia; Paraplegia; Seizure Disorder Psychiatric Medical History: Negative for: Anorexia/bulimia; Confinement Anxiety Immunizations Pneumococcal Vaccine: Received Pneumococcal Vaccination: Yes Implantable Devices None Family and Social History Cancer: No; Diabetes: Yes - Mother; Heart Disease: No; Hereditary Spherocytosis: No; Hypertension: No; Kidney Disease: No; Lung Disease: No; Seizures: No; Stroke: No; Thyroid Problems: No; Tuberculosis: No; Former smoker - quit 57 years ago; Alcohol Use: Rarely; Drug Use: No History; Caffeine Use: Daily Electronic Signature(s) Signed: 03/01/2021 3:34:38 PM By: Carlene Coria RN  Signed: 03/01/2021 4:10:05 PM By: Linton Ham MD Entered By: Carlene Coria on 03/01/2021 14:45:21 [name], [name] ([id number]) -------------------------------------------------------------------------------- SuperBill Details Patient Name: James Prude T. Date of Service: 03/01/2021 Medical Record Number: [id number] Patient Account  Number: [id number] Date of Birth/Sex: 1930-11-18 (87 y.o. M) Treating RN: Cornell Barman Primary Care Provider: Harrel Lemon Other Clinician: Referring Provider: Harrel Lemon Treating Provider/Extender: Tito Dine in Treatment: 0 Diagnosis Coding ICD-10 Codes Code Description [phone] Pressure ulcer of sacral region, stage 2 Facility Procedures CPT4 Code: [id number] Description: 22567 - WOUND CARE VISIT-LEV 4 EST PT Modifier: Quantity: 1 Physician Procedures CPT4 Code: 2091980 Description: 22179 - WC PHYS LEVEL 4 - EST PT Modifier: Quantity: 1 CPT4 Code: Description: ICD-10 Diagnosis Description L89.152 Pressure ulcer of sacral region, stage 2 Modifier: Quantity: Electronic Signature(s) Signed: 03/01/2021 4:10:05 PM By: Linton Ham MD Entered By: Linton Ham on 03/01/2021 15:47:10

## 2021-03-03 ENCOUNTER — Encounter
Admission: RE | Admit: 2021-03-03 | Discharge: 2021-03-03 | Disposition: A | Discharge status: Home / Self Care | Payer: PPO | Source: Ambulatory Visit | Attending: Nephrology | Admitting: Nephrology

## 2021-03-03 ENCOUNTER — Other Ambulatory Visit: Payer: Self-pay

## 2021-03-03 DIAGNOSIS — L89310 Pressure ulcer of right buttock, unstageable: Secondary | ICD-10-CM | POA: Diagnosis not present

## 2021-03-03 DIAGNOSIS — S31809A Unspecified open wound of unspecified buttock, initial encounter: Secondary | ICD-10-CM | POA: Diagnosis not present

## 2021-03-03 MED ORDER — FUROSEMIDE 10 MG/ML IJ SOLN: 40.0000 mg | Freq: Once | INTRAMUSCULAR | Status: AC

## 2021-03-03 MED ORDER — POTASSIUM CHLORIDE CRYS ER 20 MEQ PO TBCR: 20.0000 meq | EXTENDED_RELEASE_TABLET | Freq: Once | ORAL | Status: AC

## 2021-03-03 MED ORDER — FUROSEMIDE 10 MG/ML IJ SOLN
INTRAMUSCULAR | Status: AC
  Administered 2021-03-03: 40 mg via INTRAVENOUS
  Filled 2021-03-03: qty 4

## 2021-03-03 MED ORDER — POTASSIUM CHLORIDE CRYS ER 20 MEQ PO TBCR
EXTENDED_RELEASE_TABLET | ORAL | Status: AC
  Administered 2021-03-03: 20 meq via ORAL
  Filled 2021-03-03: qty 1

## 2021-03-06 DIAGNOSIS — I129 Hypertensive chronic kidney disease with stage 1 through stage 4 chronic kidney disease, or unspecified chronic kidney disease: Secondary | ICD-10-CM | POA: Diagnosis not present

## 2021-03-06 DIAGNOSIS — I10 Essential (primary) hypertension: Secondary | ICD-10-CM | POA: Diagnosis not present

## 2021-03-06 DIAGNOSIS — I5022 Chronic systolic (congestive) heart failure: Secondary | ICD-10-CM | POA: Diagnosis not present

## 2021-03-06 DIAGNOSIS — N2581 Secondary hyperparathyroidism of renal origin: Secondary | ICD-10-CM | POA: Diagnosis not present

## 2021-03-06 DIAGNOSIS — N184 Chronic kidney disease, stage 4 (severe): Secondary | ICD-10-CM | POA: Diagnosis not present

## 2021-03-06 NOTE — Progress Notes (Signed)
James, Holt ([id number]) Visit Report for 03/01/2021 Allergy List Details Patient Name: [name], [name]. Date of Service: 03/01/2021 2:30 PM Medical Record Number: [id number] Patient Account Number: [id number] Date of Birth/Sex: Mar 04, 1931 (87 y.o. M) Treating RN: Carlene Coria Primary Care Curly Mackowski: Harrel Lemon Other Clinician: Referring Rebecca Motta: Harrel Lemon Treating Ezri Fanguy/Extender: Tito Dine in Treatment: 0 Allergies Active Allergies morphine Reaction: anaphylaxis Severity: Severe oxycodone carvedilol Reaction: dizziness Iodinated Contrast Media Reaction: rash lisinopril Reaction: cough Severity: Mild Allergy Notes Electronic Signature(s) Signed: 03/01/2021 3:34:38 PM By: Carlene Coria RN Entered By: Carlene Coria on 03/01/2021 14:45:08 Sheets, [name] ([id number]) -------------------------------------------------------------------------------- Arrival Information Details Patient Name: James Prude T. Date of Service: 03/01/2021 2:30 PM Medical Record Number: [id number] Patient Account Number: [id number] Date of Birth/Sex: June 23, 1931 (87 y.o. M) Treating RN: Carlene Coria Primary Care Avyn Coate: Harrel Lemon Other Clinician: Referring Quinn Quam: Harrel Lemon Treating Malasha Kleppe/Extender: Tito Dine in Treatment: 0 Visit Information Patient Arrived: Wheel Chair Arrival Time: 14:42 Accompanied By: son Transfer Assistance: None Patient Identification Verified: Yes Secondary Verification Process Completed: Yes Patient Requires Transmission-Based Precautions: No Patient Has Alerts: No History Since Last Visit All ordered tests and consults were completed: No Added or deleted any medications: No Any new allergies or adverse reactions: No Had a fall or experienced change in activities of daily living that may affect risk of falls: No Signs or symptoms of abuse/neglect since last visito No Hospitalized since last visit:  No Implantable device outside of the clinic excluding cellular tissue based products placed in the center since last visit: No Electronic Signature(s) Signed: 03/01/2021 3:34:38 PM By: Carlene Coria RN Entered By: Carlene Coria on 03/01/2021 14:42:57 Miyamoto, [name] ([id number]) -------------------------------------------------------------------------------- Clinic Level of Care Assessment Details Patient Name: James Prude T. Date of Service: 03/01/2021 2:30 PM Medical Record Number: [id number] Patient Account Number: [id number] Date of Birth/Sex: [birth year] (87 y.o. M) Treating RN: Cornell Barman Primary Care Jolin Benavides: Harrel Lemon Other Clinician: Referring Nori Poland: Harrel Lemon Treating Surafel Hilleary/Extender: Tito Dine in Treatment: 0 Clinic Level of Care Assessment Items TOOL 2 Quantity Score []  - Use when only an EandM is performed on the INITIAL visit 0 ASSESSMENTS - Nursing Assessment / Reassessment X - General Physical Exam (combine w/ comprehensive assessment (listed just below) when performed on new 1 20 pt. evals) X- 1 25 Comprehensive Assessment (HX, ROS, Risk Assessments, Wounds Hx, etc.) ASSESSMENTS - Wound and Skin Assessment / Reassessment []  - Simple Wound Assessment / Reassessment - one wound 0 X- 2 5 Complex Wound Assessment / Reassessment - multiple wounds []  - 0 Dermatologic / Skin Assessment (not related to wound area) ASSESSMENTS - Ostomy and/or Continence Assessment and Care []  - Incontinence Assessment and Management 0 []  - 0 Ostomy Care Assessment and Management (repouching, etc.) PROCESS - Coordination of Care X - Simple Patient / Family Education for ongoing care 1 15 []  - 0 Complex (extensive) Patient / Family Education for ongoing care X- 1 10 Staff obtains Programmer, systems, Records, Test Results / Process Orders []  - 0 Staff telephones HHA, Nursing Homes / Clarify orders / etc []  - 0 Routine Transfer to another Facility (non-emergent  condition) []  - 0 Routine Hospital Admission (non-emergent condition) X- 1 15 New Admissions / Biomedical engineer / Ordering NPWT, Apligraf, etc. []  - 0 Emergency Hospital Admission (emergent condition) X- 1 10 Simple Discharge Coordination []  - 0 Complex (extensive) Discharge Coordination PROCESS - Special Needs []  - Pediatric / Minor Patient Management 0 []  - 0 Isolation  Patient Management []  - 0 Hearing / Language / Visual special needs []  - 0 Assessment of Community assistance (transportation, D/C planning, etc.) []  - 0 Additional assistance / Altered mentation []  - 0 Support Surface(s) Assessment (bed, cushion, seat, etc.) INTERVENTIONS - Wound Cleansing / Measurement X - Wound Imaging (photographs - any number of wounds) 1 5 []  - 0 Wound Tracing (instead of photographs) X- 1 5 Simple Wound Measurement - one wound []  - 0 Complex Wound Measurement - multiple wounds Holt, James T. ([id number]) X- 1 5 Simple Wound Cleansing - one wound []  - 0 Complex Wound Cleansing - multiple wounds INTERVENTIONS - Wound Dressings []  - Small Wound Dressing one or multiple wounds 0 X- 2 15 Medium Wound Dressing one or multiple wounds []  - 0 Large Wound Dressing one or multiple wounds []  - 0 Application of Medications - injection INTERVENTIONS - Miscellaneous []  - External ear exam 0 []  - 0 Specimen Collection (cultures, biopsies, blood, body fluids, etc.) []  - 0 Specimen(s) / Culture(s) sent or taken to Lab for analysis []  - 0 Patient Transfer (multiple staff / Civil Service fast streamer / Similar devices) []  - 0 Simple Staple / Suture removal (25 or less) []  - 0 Complex Staple / Suture removal (26 or more) []  - 0 Hypo / Hyperglycemic Management (close monitor of Blood Glucose) []  - 0 Ankle / Brachial Index (ABI) - do not check if billed separately Has the patient been seen at the hospital within the last three years: Yes Total Score: 150 Level Of Care: New/Established -  Level 4 Electronic Signature(s) Signed: 03/01/2021 5:10:04 PM By: Gretta Cool, BSN, RN, CWS, Kim RN, BSN Entered By: Gretta Cool, BSN, RN, CWS, Kim on 03/01/2021 15:26:10 Llamas, [name] ([id number]) -------------------------------------------------------------------------------- Encounter Discharge Information Details Patient Name: James Prude T. Date of Service: 03/01/2021 2:30 PM Medical Record Number: [id number] Patient Account Number: [id number] Date of Birth/Sex: 01-01-1931 (87 y.o. M) Treating RN: Donnamarie Poag Primary Care Laurana Magistro: Harrel Lemon Other Clinician: Referring Giomar Gusler: Harrel Lemon Treating Kymberley Raz/Extender: Tito Dine in Treatment: 0 Encounter Discharge Information Items Post Procedure Vitals Discharge Condition: Stable Temperature (F): 97.8 Ambulatory Status: Wheelchair Pulse (bpm): 71 Discharge Destination: Home Respiratory Rate (breaths/min): 18 Transportation: Private Auto Blood Pressure (mmHg): 115/71 Accompanied By: son Schedule Follow-up Appointment: Yes Clinical Summary of Care: Electronic Signature(s) Signed: 03/06/2021 11:12:12 AM By: Donnamarie Poag Entered By: Donnamarie Poag on 03/01/2021 15:40:26 Thurlow, [name] ([id number]) -------------------------------------------------------------------------------- Lower Extremity Assessment Details Patient Name: James Prude T. Date of Service: 03/01/2021 2:30 PM Medical Record Number: [id number] Patient Account Number: [id number] Date of Birth/Sex: 05/08/1931 (87 y.o. M) Treating RN: Carlene Coria Primary Care Foxx Klarich: Harrel Lemon Other Clinician: Referring Lolamae Voisin: Harrel Lemon Treating Westly Hinnant/Extender: Tito Dine in Treatment: 0 Electronic Signature(s) Signed: 03/01/2021 3:34:38 PM By: Carlene Coria RN Entered By: Carlene Coria on 03/01/2021 15:04:18 Cullimore, [name] ([id number]) -------------------------------------------------------------------------------- Multi Wound  Chart Details Patient Name: James Prude T. Date of Service: 03/01/2021 2:30 PM Medical Record Number: [id number] Patient Account Number: [id number] Date of Birth/Sex: [birth year] (87 y.o. M) Treating RN: Cornell Barman Primary Care Samary Shatz: Harrel Lemon Other Clinician: Referring Coleton Woon: Harrel Lemon Treating Asad Keeven/Extender: Tito Dine in Treatment: 0 Vital Signs Height(in): 71 Pulse(bpm): 98 Weight(lbs): 164 Blood Pressure(mmHg): 115/71 Body Mass Index(BMI): 23 Temperature(F): 97.8 Respiratory Rate(breaths/min): 18 Photos: [N/A:N/A] Wound Location: Proximal Sacrum Distal Sacrum N/A Wounding Event: Gradually Appeared Gradually Appeared N/A Primary Etiology: Pressure Ulcer Pressure Ulcer N/A Comorbid History: Coronary Artery Disease, Coronary Artery Disease, N/A  Hypertension, History of pressure Hypertension, History of pressure wounds wounds Date Acquired: 02/12/2021 02/12/2021 N/A Weeks of Treatment: 0 0 N/A Wound Status: Open Open N/A Measurements L x W x D (cm) 0.6x0.7x0.1 2.5x2.1x0.1 N/A Area (cm) : 0.33 4.123 N/A Volume (cm) : 0.033 0.412 N/A % Reduction in Area: 0.00% N/A N/A % Reduction in Volume: 0.00% N/A N/A Classification: Category/Stage III Category/Stage III N/A Exudate Amount: Medium Medium N/A Exudate Type: Serosanguineous Serosanguineous N/A Exudate Color: red, brown red, brown N/A Granulation Amount: Medium (34-66%) Medium (34-66%) N/A Granulation Quality: Pink Pink N/A Necrotic Amount: Medium (34-66%) Medium (34-66%) N/A Exposed Structures: Fat Layer (Subcutaneous Tissue): Fat Layer (Subcutaneous Tissue): N/A Yes Yes Fascia: No Fascia: No Tendon: No Tendon: No Muscle: No Muscle: No Joint: No Joint: No Bone: No Bone: No Epithelialization: None None N/A Debridement: Chemical/Enzymatic/Mechanical Chemical/Enzymatic/Mechanical N/A Pre-procedure Verification/Time 15:17 15:17 N/A Out Taken: Instrument: Other(saline and gauze)  Other(saline and gauze) N/A Bleeding: Minimum Minimum N/A Hemostasis Achieved: Pressure Pressure N/A Debridement Treatment Procedure was tolerated well Procedure was tolerated well N/A Response: Post Debridement 0.6x0.7x0.1 2.5x2.1x0.1 N/A Measurements L x W x D (cm) Post Debridement Volume: 0.033 0.412 N/A (cm) Post Debridement Stage: Category/Stage III Category/Stage III N/A Procedures Performed: Debridement Debridement N/A DEMARIO, FANIEL ([id number]) Treatment Notes Electronic Signature(s) Signed: 03/01/2021 4:10:05 PM By: Linton Ham MD Entered By: Linton Ham on 03/01/2021 15:32:16 Dobransky, [name] ([id number]) -------------------------------------------------------------------------------- Multi-Disciplinary Care Plan Details Patient Name: James Prude T. Date of Service: 03/01/2021 2:30 PM Medical Record Number: [id number] Patient Account Number: [id number] Date of Birth/Sex: [birth year] (87 y.o. M) Treating RN: Cornell Barman Primary Care Trixie Maclaren: Harrel Lemon Other Clinician: Referring Tywana Robotham: Harrel Lemon Treating Sargun Rummell/Extender: Tito Dine in Treatment: 0 Active Inactive Orientation to the Wound Care Program Nursing Diagnoses: Knowledge deficit related to the wound healing center program Goals: Patient/caregiver will verbalize understanding of the [place] Date Initiated: 03/01/2021 Target Resolution Date: 03/01/2021 Goal Status: Active Interventions: Provide education on orientation to the wound center Notes: Pressure Nursing Diagnoses: Knowledge deficit related to causes and risk factors for pressure ulcer development Knowledge deficit related to management of pressures ulcers Goals: Patient will remain free from development of additional pressure ulcers Date Initiated: 03/01/2021 Target Resolution Date: 03/22/2021 Goal Status: Active Patient will remain free of pressure ulcers Date Initiated: 03/01/2021 Target  Resolution Date: 03/22/2021 Goal Status: Active Patient/caregiver will verbalize risk factors for pressure ulcer development Date Initiated: 03/01/2021 Target Resolution Date: 03/01/2021 Goal Status: Active Patient/caregiver will verbalize understanding of pressure ulcer management Date Initiated: 03/01/2021 Target Resolution Date: 03/01/2021 Goal Status: Active Interventions: Assess: immobility, friction, shearing, incontinence upon admission and as needed Assess offloading mechanisms upon admission and as needed Assess potential for pressure ulcer upon admission and as needed Provide education on pressure ulcers Notes: Wound/Skin Impairment Nursing Diagnoses: Impaired tissue integrity Knowledge deficit related to ulceration/compromised skin integrity Goals: Patient/caregiver will verbalize understanding of skin care regimen Date Initiated: 03/01/2021 Target Resolution Date: 03/08/2021 Goal Status: Active Fregia, Saamir T. ([id number]) Ulcer/skin breakdown will have a volume reduction of 30% by week 4 Date Initiated: 03/01/2021 Target Resolution Date: 03/29/2021 Goal Status: Active Interventions: Assess patient/caregiver ability to obtain necessary supplies Assess patient/caregiver ability to perform ulcer/skin care regimen upon admission and as needed Assess ulceration(s) every visit Provide education on ulcer and skin care Treatment Activities: Skin care regimen initiated : 03/01/2021 Topical wound management initiated : 03/01/2021 Notes: Electronic Signature(s) Signed: 03/01/2021 5:10:04 PM By: Gretta Cool, BSN, RN, CWS, Kim RN, BSN  Entered By: Gretta Cool, BSN, RN, CWS, Kim on 03/01/2021 15:14:47 Flippin, [name] ([id number]) -------------------------------------------------------------------------------- Pain Assessment Details Patient Name: James Prude T. Date of Service: 03/01/2021 2:30 PM Medical Record Number: [id number] Patient Account Number: [id number] Date of Birth/Sex: [birth year]  (87 y.o. M) Treating RN: Carlene Coria Primary Care Doralee Kocak: Harrel Lemon Other Clinician: Referring Vaniyah Lansky: Harrel Lemon Treating Julliana Whitmyer/Extender: Tito Dine in Treatment: 0 Active Problems Location of Pain Severity and Description of Pain Patient Has Paino Yes Site Locations With Dressing Change: No Duration of the Pain. Constant / Intermittento Intermittent How Long Does it Lasto Hours: Minutes: 15 Rate the pain. Current Pain Level: 3 Worst Pain Level: 7 Least Pain Level: 0 Tolerable Pain Level: 5 Character of Pain Describe the Pain: Aching Pain Management and Medication Current Pain Management: Medication: No Cold Application: No Rest: Yes Massage: No Activity: No HoltE.N.S.: No Heat Application: No Leg drop or elevation: No Is the Current Pain Management Adequate: Inadequate How does your wound impact your activities of daily livingo Sleep: No Bathing: No Appetite: No Relationship With Others: No Bladder Continence: No Emotions: No Bowel Continence: No Work: No Toileting: No Drive: No Dressing: No Hobbies: No Electronic Signature(s) Signed: 03/01/2021 3:34:38 PM By: Carlene Coria RN Entered By: Carlene Coria on 03/01/2021 14:43:59 Mehta, [name] ([id number]) -------------------------------------------------------------------------------- Patient/Caregiver Education Details Patient Name: James Prude T. Date of Service: 03/01/2021 2:30 PM Medical Record Number: [id number] Patient Account Number: [id number] Date of Birth/Gender: 1931-03-12 (87 y.o. M) Treating RN: Cornell Barman Primary Care Physician: Harrel Lemon Other Clinician: Referring Physician: Harrel Lemon Treating Physician/Extender: Tito Dine in Treatment: 0 Education Assessment Education Provided To: Patient Education Topics Provided Pressure: Handouts: Pressure Ulcers: Care and Offloading Methods: Explain/Verbal Responses: State content  correctly Welcome To [facility] [place]: Handouts: Welcome To [facility] [place] Methods: Explain/Verbal Responses: State content correctly Wound/Skin Impairment: Handouts: Caring for Your Ulcer Methods: Explain/Verbal Responses: State content correctly Electronic Signature(s) Signed: 03/01/2021 5:10:04 PM By: Gretta Cool, BSN, RN, CWS, Kim RN, BSN Entered By: Gretta Cool, BSN, RN, CWS, Kim on 03/01/2021 15:26:47 Kant, [name] ([id number]) -------------------------------------------------------------------------------- Wound Assessment Details Patient Name: James Prude T. Date of Service: 03/01/2021 2:30 PM Medical Record Number: [id number] Patient Account Number: [id number] Date of Birth/Sex: [birth year] (87 y.o. M) Treating RN: Carlene Coria Primary Care Sammantha Mehlhaff: Harrel Lemon Other Clinician: Referring Novaleigh Kohlman: Harrel Lemon Treating Deeann Servidio/Extender: Tito Dine in Treatment: 0 Wound Status Wound Number: 3 Primary Pressure Ulcer Etiology: Wound Location: Proximal Sacrum Wound Status: Open Wounding Event: Gradually Appeared Comorbid Coronary Artery Disease, Hypertension, History of Date Acquired: 02/12/2021 History: pressure wounds Weeks Of Treatment: 0 Clustered Wound: No Photos Wound Measurements Length: (cm) 0.6 % Redu Width: (cm) 0.7 % Redu Depth: (cm) 0.1 Epithe Area: (cm) 0.33 Tunne Volume: (cm) 0.033 Under ction in Area: 0% ction in Volume: 0% lialization: None ling: No mining: No Wound Description Classification: Category/Stage III Foul O Exudate Amount: Medium Slough Exudate Type: Serosanguineous Exudate Color: red, brown dor After Cleansing: No /Fibrino Yes Wound Bed Granulation Amount: Medium (34-66%) Exposed Structure Granulation Quality: Pink Fascia Exposed: No Necrotic Amount: Medium (34-66%) Fat Layer (Subcutaneous Tissue) Exposed: Yes Necrotic Quality: Adherent Slough Tendon Exposed: No Muscle Exposed: No Joint Exposed:  No Bone Exposed: No Treatment Notes Wound #3 (Sacrum) Wound Laterality: Proximal Cleanser Peri-Wound Care Topical Primary Dressing Afzal, Maciej T. ([id number]) Prisma 4.34 (in) Discharge Instruction: Moisten w/normal saline or sterile water; Cover wound as directed. Do not remove from wound bed. Secondary  Dressing Mepilex Border Flex, 4x4 (in/in) Discharge Instruction: Apply to wound as directed. Do not cut. Secured With Compression Wrap Compression Stockings Environmental education officer) Signed: 03/01/2021 3:34:38 PM By: Carlene Coria RN Signed: 03/01/2021 5:10:04 PM By: Gretta Cool, BSN, RN, CWS, Kim RN, BSN Entered By: Gretta Cool, BSN, RN, CWS, Kim on 03/01/2021 15:27:48 Gauss, [name] ([id number]) -------------------------------------------------------------------------------- Wound Assessment Details Patient Name: James Prude T. Date of Service: 03/01/2021 2:30 PM Medical Record Number: [id number] Patient Account Number: [id number] Date of Birth/Sex: [birth year] (87 y.o. M) Treating RN: Carlene Coria Primary Care Rory Xiang: Harrel Lemon Other Clinician: Referring Peyson Delao: Harrel Lemon Treating Sheera Illingworth/Extender: Tito Dine in Treatment: 0 Wound Status Wound Number: 4 Primary Pressure Ulcer Etiology: Wound Location: Distal Sacrum Wound Status: Open Wounding Event: Gradually Appeared Comorbid Coronary Artery Disease, Hypertension, History of Date Acquired: 02/12/2021 History: pressure wounds Weeks Of Treatment: 0 Clustered Wound: No Photos Wound Measurements Length: (cm) 2.5 % Redu Width: (cm) 2.1 % Redu Depth: (cm) 0.1 Epithe Area: (cm) 4.123 Tunne Volume: (cm) 0.412 Under ction in Area: ction in Volume: lialization: None ling: No mining: No Wound Description Classification: Category/Stage III Foul O Exudate Amount: Medium Slough Exudate Type: Serosanguineous Exudate Color: red, brown dor After Cleansing: No /Fibrino Yes Wound  Bed Granulation Amount: Medium (34-66%) Exposed Structure Granulation Quality: Pink Fascia Exposed: No Necrotic Amount: Medium (34-66%) Fat Layer (Subcutaneous Tissue) Exposed: Yes Necrotic Quality: Adherent Slough Tendon Exposed: No Muscle Exposed: No Joint Exposed: No Bone Exposed: No Treatment Notes Wound #4 (Sacrum) Wound Laterality: Distal Cleanser Peri-Wound Care Topical Primary Dressing Prieto, Frederich T. ([id number]) Prisma 4.34 (in) Discharge Instruction: Moisten w/normal saline or sterile water; Cover wound as directed. Do not remove from wound bed. Secondary Dressing Mepilex Border Flex, 4x4 (in/in) Discharge Instruction: Apply to wound as directed. Do not cut. Secured With Compression Wrap Compression Stockings Environmental education officer) Signed: 03/01/2021 3:34:38 PM By: Carlene Coria RN Entered By: Carlene Coria on 03/01/2021 15:02:09 Brum, [name] ([id number]) -------------------------------------------------------------------------------- Vitals Details Patient Name: James Prude T. Date of Service: 03/01/2021 2:30 PM Medical Record Number: [id number] Patient Account Number: [id number] Date of Birth/Sex: 07-May-1931 (87 y.o. M) Treating RN: Carlene Coria Primary Care Monzerrath Mcburney: Harrel Lemon Other Clinician: Referring Kortny Lirette: Harrel Lemon Treating Yasser Hepp/Extender: Tito Dine in Treatment: 0 Vital Signs Time Taken: 14:44 Temperature (F): 97.8 Height (in): 71 Pulse (bpm): 71 Source: Stated Respiratory Rate (breaths/min): 18 Weight (lbs): 164 Blood Pressure (mmHg): 115/71 Source: Stated Reference Range: 80 - 120 mg / dl Body Mass Index (BMI): 22.9 Electronic Signature(s) Signed: 03/01/2021 3:34:38 PM By: Carlene Coria RN Entered By: Carlene Coria on 03/01/2021 14:44:44

## 2021-03-09 ENCOUNTER — Encounter: Payer: Self-pay | Admitting: Emergency Medicine

## 2021-03-09 ENCOUNTER — Emergency Department: Payer: PPO

## 2021-03-09 ENCOUNTER — Other Ambulatory Visit: Payer: Self-pay

## 2021-03-09 ENCOUNTER — Emergency Department
Admission: EM | Admit: 2021-03-09 | Discharge: 2021-03-09 | Disposition: A | Discharge status: Home / Self Care | Payer: PPO | Attending: Emergency Medicine | Admitting: Emergency Medicine

## 2021-03-09 DIAGNOSIS — R2242 Localized swelling, mass and lump, left lower limb: Secondary | ICD-10-CM | POA: Diagnosis not present

## 2021-03-09 DIAGNOSIS — Z7982 Long term (current) use of aspirin: Secondary | ICD-10-CM | POA: Insufficient documentation

## 2021-03-09 DIAGNOSIS — Z87891 Personal history of nicotine dependence: Secondary | ICD-10-CM | POA: Diagnosis not present

## 2021-03-09 DIAGNOSIS — I130 Hypertensive heart and chronic kidney disease with heart failure and stage 1 through stage 4 chronic kidney disease, or unspecified chronic kidney disease: Secondary | ICD-10-CM | POA: Insufficient documentation

## 2021-03-09 DIAGNOSIS — R350 Frequency of micturition: Secondary | ICD-10-CM | POA: Diagnosis not present

## 2021-03-09 DIAGNOSIS — N184 Chronic kidney disease, stage 4 (severe): Secondary | ICD-10-CM | POA: Diagnosis not present

## 2021-03-09 DIAGNOSIS — I5023 Acute on chronic systolic (congestive) heart failure: Secondary | ICD-10-CM | POA: Diagnosis not present

## 2021-03-09 DIAGNOSIS — Z955 Presence of coronary angioplasty implant and graft: Secondary | ICD-10-CM | POA: Diagnosis not present

## 2021-03-09 DIAGNOSIS — R194 Change in bowel habit: Secondary | ICD-10-CM | POA: Diagnosis present

## 2021-03-09 DIAGNOSIS — Z951 Presence of aortocoronary bypass graft: Secondary | ICD-10-CM | POA: Insufficient documentation

## 2021-03-09 DIAGNOSIS — I2510 Atherosclerotic heart disease of native coronary artery without angina pectoris: Secondary | ICD-10-CM | POA: Insufficient documentation

## 2021-03-09 DIAGNOSIS — Z79899 Other long term (current) drug therapy: Secondary | ICD-10-CM | POA: Insufficient documentation

## 2021-03-09 DIAGNOSIS — Z96653 Presence of artificial knee joint, bilateral: Secondary | ICD-10-CM | POA: Diagnosis not present

## 2021-03-09 DIAGNOSIS — E876 Hypokalemia: Secondary | ICD-10-CM | POA: Insufficient documentation

## 2021-03-09 DIAGNOSIS — K5900 Constipation, unspecified: Secondary | ICD-10-CM | POA: Diagnosis not present

## 2021-03-09 LAB — CBC WITH DIFFERENTIAL/PLATELET
Abs Immature Granulocytes: 0.02 10*3/uL (ref 0.00–0.07)
Basophils Absolute: 0 10*3/uL (ref 0.0–0.1)
Basophils Relative: 0 %
Eosinophils Absolute: 0.1 10*3/uL (ref 0.0–0.5)
Eosinophils Relative: 2 %
HCT: 35.3 % — ABNORMAL LOW (ref 39.0–52.0)
Hemoglobin: 12.3 g/dL — ABNORMAL LOW (ref 13.0–17.0)
Immature Granulocytes: 0 %
Lymphocytes Relative: 11 %
Lymphs Abs: 0.8 10*3/uL (ref 0.7–4.0)
MCH: 30.9 pg (ref 26.0–34.0)
MCHC: 34.8 g/dL (ref 30.0–36.0)
MCV: 88.7 fL (ref 80.0–100.0)
Monocytes Absolute: 0.7 10*3/uL (ref 0.1–1.0)
Monocytes Relative: 11 %
Neutro Abs: 5 10*3/uL (ref 1.7–7.7)
Neutrophils Relative %: 76 %
Platelets: 132 10*3/uL — ABNORMAL LOW (ref 150–400)
RBC: 3.98 MIL/uL — ABNORMAL LOW (ref 4.22–5.81)
RDW: 18.2 % — ABNORMAL HIGH (ref 11.5–15.5)
WBC: 6.6 10*3/uL (ref 4.0–10.5)
nRBC: 0 % (ref 0.0–0.2)

## 2021-03-09 LAB — BASIC METABOLIC PANEL
Anion gap: 10 (ref 5–15)
BUN: 64 mg/dL — ABNORMAL HIGH (ref 8–23)
CO2: 23 mmol/L (ref 22–32)
Calcium: 8.6 mg/dL — ABNORMAL LOW (ref 8.9–10.3)
Chloride: 97 mmol/L — ABNORMAL LOW (ref 98–111)
Creatinine, Ser: 2.71 mg/dL — ABNORMAL HIGH (ref 0.61–1.24)
GFR, Estimated: 22 mL/min — ABNORMAL LOW (ref >60)
Glucose, Bld: 135 mg/dL — ABNORMAL HIGH (ref 70–99)
Potassium: 3.2 mmol/L — ABNORMAL LOW (ref 3.5–5.1)
Sodium: 130 mmol/L — ABNORMAL LOW (ref 135–145)

## 2021-03-09 NOTE — ED Notes (Signed)
Patient given soap suds enema and assisted to bedside commode. No BM noted at this time. [place], FNP aware.

## 2021-03-09 NOTE — Discharge Instructions (Addendum)
Please continue to take MiraLax unless you begin to have diarrhea.  Follow up with primary care in a week or so for repeat potassium screening.  Return to the ER for symptoms that change or worsen if unable to schedule an appointment.

## 2021-03-09 NOTE — ED Triage Notes (Signed)
Pt reports has not been able to have a BM in a week. Pt states has had the issue before but he is usually able to relieve himself. Pt also reports his MD told him that his K+ was low

## 2021-03-09 NOTE — ED Provider Notes (Signed)
[facility] Emergency Department Provider Note ____________________________________________   Event Date/Time   First MD Initiated Contact with Patient 03/09/21 1456     (approximate)  I have reviewed the triage vital signs and the nursing notes.   HISTORY  Chief Complaint Constipation  HPI [name] is a 87 y.o. male with history of CHF, renal failure not on dialysis, colon resection presents to the emergency department for treatment and evaluation of constipation and hypokalemia. He reports that he has not had a bowel movement in a week or more. He is able to pass gas, but feels bloated and "full."  He was taken off lasix and placed on torsemide due to decreasing kidney function.  He states that he has been urinating frequently for the past couple of days.  His PCP has advised him to take 2 tablets if he gains 3 pounds or more in a week.  He states that he has gained 2 pounds this week but is unsure if it is due to stool retention or fluid retention.  He denies abdominal pain.  He has some swelling in the left lower extremity which is common for him and is not more excessive than usual.     Past Medical History:  Diagnosis Date   Anemia    Aortic aneurysm (HCC)    Aortic aneurysm (HCC)    Arthritis    CKD (chronic kidney disease)    ALSO LEFT RENAL MASS   Coronary artery disease    Diverticulosis    WITH RUPTURE   GERD (gastroesophageal reflux disease)    History of hiatal hernia    Hypercholesteremia    Hypertension    MGUS (monoclonal gammopathy of unknown significance)    MGUS (monoclonal gammopathy of unknown significance)     Patient Active Problem List   Diagnosis Date Noted   Unstageable pressure ulcer of sacral region ([place]) 02/01/2021   CHF exacerbation ([place]) 01/31/2021   CHF (congestive heart failure), NYHA class IV, acute on chronic, systolic ([place]) 54/65/0354   Acute respiratory failure with hypoxia ([place]) 09/07/2020   Elevated  troponin 09/07/2020   Pleural effusion 09/07/2020   Acute on chronic systolic CHF (congestive heart failure) ([place]) 09/07/2020   Thoracic compression fracture ([place]) 09/07/2020   Pressure injury of skin 08/07/2020   CHF (congestive heart failure) ([place]) 08/05/2020   Periprosthetic fracture around internal prosthetic hip joint 08/05/2020   Hyponatremia 08/05/2020   Constipation 05/15/2020   Acute renal failure superimposed on stage 4 chronic kidney disease ([place]) 05/15/2020   Hip fracture ([place]) 05/11/2020   MGUS (monoclonal gammopathy of unknown significance) 05/23/2016   S/P total knee arthroplasty 04/02/2016   Chronic kidney disease 11/22/2015   CAD in native artery 11/22/2015   TI (tricuspid incompetence) 05/11/2015   Early satiety 01/25/2015   Benign essential HTN 01/03/2015   Abdominal aortic aneurysm (AAA) without rupture ([place]) 07/09/2014   MI (mitral incompetence) 07/09/2014   Aortic heart valve narrowing 07/09/2014   Cardiomyopathy, ischemic 06/28/2014   Arthritis of knee, degenerative 05/17/2014   Kidney lump 04/09/2013   Benign prostatic hyperplasia with urinary obstruction 03/17/2013   Chronic kidney disease, stage IV (severe) ([place]) 03/17/2013   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    Past Surgical History:  Procedure Laterality Date   AORTIC VALVE REPLACEMENT     bipass     CARDIAC VALVE REPLACEMENT     pig valve   CHOLECYSTECTOMY     COLON SURGERY  CORONARY ANGIOPLASTY     CORONARY ARTERY BYPASS GRAFT     ESOPHAGOGASTRODUODENOSCOPY (EGD) WITH PROPOFOL N/A 02/07/2015   Procedure: ESOPHAGOGASTRODUODENOSCOPY (EGD) WITH PROPOFOL;  Surgeon: Hulen Luster, MD;  Location[facility] ENDOSCOPY;  Service: Gastroenterology;  Laterality: N/A;   EYE SURGERY     HERNIA REPAIR     INTRAMEDULLARY (IM) NAIL INTERTROCHANTERIC Right 05/12/2020   Procedure: INTRAMEDULLARY (IM) NAIL INTERTROCHANTRIC;  Surgeon: Lovell Sheehan, MD;  Location: [facility] ORS;  Service:  Orthopedics;  Laterality: Right;   JOINT REPLACEMENT     left knee x4   KNEE ARTHROPLASTY Right 04/02/2016   Procedure: COMPUTER ASSISTED TOTAL KNEE ARTHROPLASTY;  Surgeon: Dereck Leep, MD;  Location: [facility] ORS;  Service: Orthopedics;  Laterality: Right;   ORIF FEMUR FRACTURE Right 08/08/2020   Procedure: OPEN REDUCTION INTERNAL FIXATION (ORIF) DISTAL FEMUR FRACTURE;  Surgeon: Lovell Sheehan, MD;  Location: [facility] ORS;  Service: Orthopedics;  Laterality: Right;    Prior to Admission medications   Medication Sig Start Date End Date Taking? Authorizing Provider  albuterol (VENTOLIN HFA) 108 (90 Base) MCG/ACT inhaler Inhale into the lungs. 01/16/21 01/16/22  [provider]  ALPRAZolam Duanne Moron) 0.25 MG tablet Take 0.25 mg by mouth 2 (two) times daily as needed. 08/26/20   [provider]  amiodarone (PACERONE) 200 MG tablet Take 1 tablet (200 mg total) by mouth 2 (two) times daily. 02/08/21   Wouk, Ailene Rud, MD  aspirin EC 81 MG tablet Take 1 tablet (81 mg total) by mouth 2 (two) times daily. Swallow whole. 08/10/20   Donne Hazel, MD  fluticasone [facility]) 50 MCG/ACT nasal spray USE 2 SPRAYS INTO BOTH NOSTRILS DAILY 10/11/20   [provider]  losartan (COZAAR) 25 MG tablet Take 1 tablet (25 mg total) by mouth daily. 02/08/21   Wouk, Ailene Rud, MD  Melatonin 3 MG TABS Take 1.5 mg by mouth at bedtime as needed (sleep).    [provider]  potassium chloride SA (KLOR-CON) 20 MEQ tablet Take 2 tablets (40 mEq total) by mouth daily. 02/08/21   Wouk, Ailene Rud, MD  pravastatin (PRAVACHOL) 20 MG tablet Take 20 mg by mouth every evening.    [provider]  torsemide (DEMADEX) 20 MG tablet Take 2 tablets (40 mg total) by mouth daily. 09/10/20   Lorella Nimrod, MD  traMADol (ULTRAM) 50 MG tablet Take 50 mg by mouth every 4 (four) hours as needed. 08/16/20   [provider]  zolpidem (AMBIEN) 5 MG tablet Take by mouth. 01/13/13   [provider]    Allergies Morphine, Oxycodone, Carvedilol, Iodinated diagnostic agents, and Lisinopril  Family History  Problem Relation Age of Onset   Bladder Cancer Neg Hx    Prostate cancer Neg Hx    Kidney cancer Neg Hx     Social History Social History   Tobacco Use   Smoking status: Former    Pack years: 0.00   Smokeless tobacco: Never  Substance Use Topics   Alcohol use: No   Drug use: No    Review of Systems  Constitutional: No fever/chills Eyes: No visual changes. ENT: No sore throat. Cardiovascular: Denies chest pain. Respiratory: Denies shortness of breath. Gastrointestinal: No abdominal pain.  No nausea, no vomiting.  No diarrhea.  Positive constipation. Genitourinary: Negative for dysuria. Musculoskeletal: Negative for back pain. Skin: Negative for rash. Neurological: Negative for headaches, focal weakness or numbness. ____________________________________________   PHYSICAL EXAM:  VITAL SIGNS: ED Triage Vitals  Enc Vitals  Group     BP 03/09/21 1451 116/83     Pulse Rate 03/09/21 1451 75     Resp 03/09/21 1451 18     Temp 03/09/21 1451 97.6 F (36.4 C)     Temp Source 03/09/21 1451 Oral     SpO2 03/09/21 1451 90 %     Weight --      Height --      Head Circumference --      Peak Flow --      Pain Score 03/09/21 1446 0     Pain Loc --      Pain Edu? --      Excl. in [place]? --     Constitutional: Alert and oriented. Well appearing and in no acute distress. Eyes: Conjunctivae are normal.  Head: Atraumatic. Nose: No congestion/rhinnorhea. Mouth/Throat: Mucous membranes are moist.  Oropharynx non-erythematous. Neck: No stridor.   Hematological/Lymphatic/Immunilogical: No cervical lymphadenopathy. Cardiovascular: Normal rate, regular rhythm. Grossly normal heart sounds.  Good peripheral circulation. Respiratory: Normal respiratory effort.  No retractions. Lungs CTAB. Gastrointestinal: Soft and nontender. No distention. No abdominal bruits.   Bowel sounds active and present x4 quadrants.. Genitourinary:  Musculoskeletal: Left lower extremity with 2+ pitting edema  neurologic:  Normal speech and language. No gross focal neurologic deficits are appreciated. No gait instability. Skin:  Skin is warm, dry and intact. No rash noted. Psychiatric: Mood and affect are normal. Speech and behavior are normal.  ____________________________________________   LABS (all labs ordered are listed, but only abnormal results are displayed)  Labs Reviewed  BASIC METABOLIC PANEL - Abnormal; Notable for the following components:      Result Value   Sodium 130 (*)    Potassium 3.2 (*)    Chloride 97 (*)    Glucose, Bld 135 (*)    BUN 64 (*)    Creatinine, Ser 2.71 (*)    Calcium 8.6 (*)    GFR, Estimated 22 (*)    All other components within normal limits  CBC WITH DIFFERENTIAL/PLATELET - Abnormal; Notable for the following components:   RBC 3.98 (*)    Hemoglobin 12.3 (*)    HCT 35.3 (*)    RDW 18.2 (*)    Platelets 132 (*)    All other components within normal limits   ____________________________________________  EKG  Not indicated ____________________________________________  RADIOLOGY  ED MD interpretation:    Nonobstructive bowel pattern with moderate stool retention.  I, Sherrie George, personally viewed and evaluated these images (plain radiographs) as part of my medical decision making, as well as reviewing the written report by the radiologist.  Official radiology report(s): DG Abdomen 1 View  Result Date: 03/09/2021 CLINICAL DATA:  Constipation EXAM: ABDOMEN - 1 VIEW COMPARISON:  05/16/2020 FINDINGS: Nonobstructed bowel gas pattern with mild to moderate stool burden. Moderate feces at the rectum. Surgical sutures in the left lower quadrant. Multiple surgical clips over the abdomen. Vascular calcifications. Intramedullary rod within the proximal right femur across chronic fracture deformity of the trochanter.  IMPRESSION: Nonobstructed bowel-gas pattern with mild to moderate stool. Moderate retained feces at the rectum Electronically Signed   By: Donavan Foil M.D.   On: 03/09/2021 15:44    ____________________________________________   PROCEDURES  Procedure(s) performed (including Critical Care):  Procedures  ____________________________________________   INITIAL IMPRESSION / ASSESSMENT AND PLAN     87 year old male presenting to the emergency department for treatment and evaluation of constipation and low potassium.  See HPI for further details.  Plan  will be to get some baseline labs and abdominal x-ray.  Exam is overall reassuring.  DIFFERENTIAL DIAGNOSIS  Constipation, ileus, bowel obstruction, hypokalemia  ED COURSE  Little to no results after soapsuds enema per nursing staff, however patient only retained the enema solution for about 2 minutes.  Plan will be to wait a bit before considering additional treatments in hopes that he will begin to feel the urge to have a bowel movement.  Patient was able to have a bowel movement and felt better. Discharged home. Encouraged to continue his MiraLax and follow up with PCP.    ___________________________________________   FINAL CLINICAL IMPRESSION(S) / ED DIAGNOSES  Final diagnoses:  Constipation, unspecified constipation type  Hypokalemia     ED Discharge Orders     None        [name] was evaluated in Emergency Department on 03/09/2021 for the symptoms described in the history of present illness. He was evaluated in the context of the global COVID-19 pandemic, which necessitated consideration that the patient might be at risk for infection with the SARS-CoV-2 virus that causes COVID-19. Institutional protocols and algorithms that pertain to the evaluation of patients at risk for COVID-19 are in a state of rapid change based on information released by regulatory bodies including the CDC and federal and state  organizations. These policies and algorithms were followed during the patient's care in the ED.   Note:  This document was prepared using Dragon voice recognition software and may include unintentional dictation errors.    Victorino Dike, FNP 03/09/21 1835    Naaman Plummer, MD 03/13/21 [phone]

## 2021-03-10 LAB — ACID FAST CULTURE WITH REFLEXED SENSITIVITIES (MYCOBACTERIA): Acid Fast Culture: NEGATIVE

## 2021-03-12 DIAGNOSIS — I509 Heart failure, unspecified: Secondary | ICD-10-CM | POA: Diagnosis not present

## 2021-03-12 DIAGNOSIS — I5023 Acute on chronic systolic (congestive) heart failure: Secondary | ICD-10-CM | POA: Diagnosis not present

## 2021-03-13 DIAGNOSIS — D631 Anemia in chronic kidney disease: Secondary | ICD-10-CM | POA: Diagnosis not present

## 2021-03-13 DIAGNOSIS — I2510 Atherosclerotic heart disease of native coronary artery without angina pectoris: Secondary | ICD-10-CM | POA: Diagnosis not present

## 2021-03-13 DIAGNOSIS — J9601 Acute respiratory failure with hypoxia: Secondary | ICD-10-CM | POA: Diagnosis not present

## 2021-03-13 DIAGNOSIS — I5023 Acute on chronic systolic (congestive) heart failure: Secondary | ICD-10-CM | POA: Diagnosis not present

## 2021-03-13 DIAGNOSIS — J918 Pleural effusion in other conditions classified elsewhere: Secondary | ICD-10-CM | POA: Diagnosis not present

## 2021-03-13 DIAGNOSIS — I130 Hypertensive heart and chronic kidney disease with heart failure and stage 1 through stage 4 chronic kidney disease, or unspecified chronic kidney disease: Secondary | ICD-10-CM | POA: Diagnosis not present

## 2021-03-13 DIAGNOSIS — N1832 Chronic kidney disease, stage 3b: Secondary | ICD-10-CM | POA: Diagnosis not present

## 2021-03-15 ENCOUNTER — Other Ambulatory Visit: Payer: Self-pay

## 2021-03-15 ENCOUNTER — Encounter (HOSPITAL_BASED_OUTPATIENT_CLINIC_OR_DEPARTMENT_OTHER): Payer: PPO | Admitting: Internal Medicine

## 2021-03-15 ENCOUNTER — Ambulatory Visit: Payer: PPO | Admitting: Family

## 2021-03-15 DIAGNOSIS — S31809A Unspecified open wound of unspecified buttock, initial encounter: Secondary | ICD-10-CM | POA: Diagnosis not present

## 2021-03-15 DIAGNOSIS — L89152 Pressure ulcer of sacral region, stage 2: Secondary | ICD-10-CM

## 2021-03-15 NOTE — Progress Notes (Signed)
ILIAS, STCHARLES ([id number]) Visit Report for 03/15/2021 Chief Complaint Document Details Patient Name: [name]. Date of Service: 03/15/2021 10:45 AM Medical Record Number: [id number] Patient Account Number: [id number] Date of Birth/Sex: [birth year] (87 y.o. M) Treating RN: Dolan Amen Primary Care Provider: Harrel Lemon Other Clinician: Referring Provider: Harrel Lemon Treating Provider/Extender: Yaakov Guthrie in Treatment: 2 Information Obtained from: Patient Chief Complaint 08/31/2020; patient is here for review of wounds on his bilateral buttocks surrounding his coccyx Electronic Signature(s) Signed: 03/15/2021 11:25:38 AM By: Kalman Shan DO Entered By: Kalman Shan on 03/15/2021 11:19:56 Seeberger, [name] -------------------------------------------------------------------------------- HPI Details Patient Name: James Prude T. Date of Service: 03/15/2021 10:45 AM Medical Record Number: [id number] Patient Account Number: [id number] Date of Birth/Sex: 1930/12/17 (87 y.o. M) Treating RN: Dolan Amen Primary Care Provider: Harrel Lemon Other Clinician: Referring Provider: Harrel Lemon Treating Provider/Extender: Yaakov Guthrie in Treatment: 2 History of Present Illness HPI Description: ADMISSION 08/31/2020 This is an 87 year old independent man who lives at home on his own. His problem started in August when he fell and fractured his right femur. He required an ORIF. He was sent to a rehab facility. At some point in this timeframe he developed bilateral pressure ulcers on his buttock. He was seen by dermatology Dr. Nehemiah Massed on 06/27/2020 noting the pressure injuries of his bilateral buttocks at the time treatment order for salicylic acid 3% ointment. Saw his primary doctor in the same timeframe. He is currently using a compounded ointment to the area including iodoquinol 1% / 0.25% hydrocortisone/2% niacinamide. I am not  really sure how this is been progressing. The wounds are stage II on the bilateral buttocks surrounding the coccyx area. The patient has been changing his dressing himself. He is a very active man in fact had another fall in November suffering a periprosthetic hip fracture requiring additional pinning. He says actually after the second surgery he improved and he no longer has as much pain. The patient is here with his daughter. He also has a son that lives nearby. Past medical history includes systolic congestive heart failure, ischemic cardiomyopathy, right femur fracture and subsequent periprosthetic fracture as described, chronic kidney disease stage III, MGUS and abdominal aortic aneurysm 12/29; since the patient was last here he was apparently admitted with congestive heart failure. He was not given any wound care supplies he was discharged on Christmas day he apparently called here to get [name] to some orders and more of the collagen and apparently we did so but none never arrived. His son has been applying topical antibiotics. There is not too much change in the wound area on either side 09/28/2020; 2 small open areas on the mirror-image of the buttock bilaterally just below the coccyx level. This is in the fold of the gluteal cleft. Both surface areas are a lot smaller we have been using silver collagen and foam dressing 10/19/2020; 2 small areas in the mirror image of the buttock bilaterally just below the coccyx level in the gluteal cleft. These are actually totally epithelialized today which is good to see READMISSION 03/01/2021 This is a patient we had earlier this year with 2 small mirror-image wounds on his bilateral buttocks in close proximity to the gluteal cleft around his sacrum. We managed to get these to close over with meticulous pressure relief and collagen. He tells me everything was going well until he was hospitalized from 01/31/2021 through 02/08/2021 with congestive heart failure.  He required IV diuretics. He was discharged on chronic  oxygen. They have been using some collagen that they had leftover from last time but ran out of supplies. Once again the patient has 3 small areas all in close proximity to the lower sacrum/coccyx and the gluteal cleft. 2 on the left and a small area on the right. Although these have clean surfaces. The larger area on the left almost is shaped like an S, not really sure how he would have done this unless he lay on an oxygen cord in the hospital. Past medical history is essentially unchanged from above except that he now wears oxygen 6/29; patient presents for 2-week follow-up. He has been using collagen to the sacral wounds and aggressively offloading the area. He denies signs of infection and has no issues today. Electronic Signature(s) Signed: 03/15/2021 11:25:38 AM By: Kalman Shan DO Entered By: Kalman Shan on 03/15/2021 11:20:30 [name], [name] -------------------------------------------------------------------------------- Physical Exam Details Patient Name: James Prude T. Date of Service: 03/15/2021 10:45 AM Medical Record Number: [id number] Patient Account Number: [id number] Date of Birth/Sex: 1931-02-20 (87 y.o. M) Treating RN: Dolan Amen Primary Care Provider: Harrel Lemon Other Clinician: Referring Provider: Harrel Lemon Treating Provider/Extender: Yaakov Guthrie in Treatment: 2 Constitutional . Psychiatric . Notes Left buttocks: There are 2 open wounds That appear well-healing, with no signs of infection. Granulation tissue present, overall very clean. Electronic Signature(s) Signed: 03/15/2021 11:25:38 AM By: Kalman Shan DO Entered By: Kalman Shan on 03/15/2021 11:21:32 Fairburn, [name] -------------------------------------------------------------------------------- Physician Orders Details Patient Name: James Prude T. Date of Service: 03/15/2021 10:45  AM Medical Record Number: [id number] Patient Account Number: [id number] Date of Birth/Sex: 05-12-1931 (87 y.o. M) Treating RN: Dolan Amen Primary Care Provider: Harrel Lemon Other Clinician: Referring Provider: Harrel Lemon Treating Provider/Extender: Yaakov Guthrie in Treatment: 2 Verbal / Phone Orders: No Diagnosis Coding Follow-up Appointments o Return Appointment in 2 weeks. Bathing/ Shower/ Hygiene o May shower with wound dressing protected with water repellent cover or cast protector. Off-Loading o Turn and reposition every 2 hours - Keep pressure off of wounded areas Wound Treatment Wound #3 - Sacrum Wound Laterality: Proximal Cleanser: Normal Saline 3 x Per Week/30 Days Discharge Instructions: Wash your hands with soap and water. Remove old dressing, discard into plastic bag and place into trash. Cleanse the wound with Normal Saline prior to applying a clean dressing using gauze sponges, not tissues or cotton balls. Do not scrub or use excessive force. Pat dry using gauze sponges, not tissue or cotton balls. Primary Dressing: Prisma 4.34 (in) (Generic) 3 x Per Week/30 Days Discharge Instructions: Moisten w/normal saline or sterile water; Cover wound as directed. Do not remove from wound bed. Secondary Dressing: Mepilex Border Flex, 4x4 (in/in) (Generic) 3 x Per Week/30 Days Discharge Instructions: Apply to wound as directed. Do not cut. Wound #4 - Sacrum Wound Laterality: Distal Cleanser: Normal Saline 3 x Per Week/30 Days Discharge Instructions: Wash your hands with soap and water. Remove old dressing, discard into plastic bag and place into trash. Cleanse the wound with Normal Saline prior to applying a clean dressing using gauze sponges, not tissues or cotton balls. Do not scrub or use excessive force. Pat dry using gauze sponges, not tissue or cotton balls. Primary Dressing: Prisma 4.34 (in) (Generic) 3 x Per Week/30 Days Discharge Instructions: Moisten  w/normal saline or sterile water; Cover wound as directed. Do not remove from wound bed. Secondary Dressing: Mepilex Border Flex, 4x4 (in/in) (Generic) 3 x Per Week/30 Days Discharge Instructions: Apply to wound as directed.  Do not cut. Electronic Signature(s) Signed: 03/15/2021 11:25:38 AM By: Kalman Shan DO Signed: 03/15/2021 12:27:32 PM By: Georges Mouse, Minus Breeding RN Entered By: Georges Mouse, Minus Breeding on 03/15/2021 11:17:57 Ruppert, [name] ([id number]) -------------------------------------------------------------------------------- Problem List Details Patient Name: James Prude T. Date of Service: 03/15/2021 10:45 AM Medical Record Number: [id number] Patient Account Number: [id number] Date of Birth/Sex: [birth year] (87 y.o. M) Treating RN: Dolan Amen Primary Care Provider: Harrel Lemon Other Clinician: Referring Provider: Harrel Lemon Treating Provider/Extender: Yaakov Guthrie in Treatment: 2 Active Problems ICD-10 Encounter Code Description Active Date MDM Diagnosis L89.152 Pressure ulcer of sacral region, stage 2 03/01/2021 No Yes Inactive Problems Resolved Problems Electronic Signature(s) Signed: 03/15/2021 11:25:38 AM By: Kalman Shan DO Entered By: Kalman Shan on 03/15/2021 11:19:32 Meisinger, [name] ([id number]) -------------------------------------------------------------------------------- Progress Note Details Patient Name: James Prude T. Date of Service: 03/15/2021 10:45 AM Medical Record Number: [id number] Patient Account Number: [id number] Date of Birth/Sex: [birth year] (87 y.o. M) Treating RN: Dolan Amen Primary Care Provider: Harrel Lemon Other Clinician: Referring Provider: Harrel Lemon Treating Provider/Extender: Yaakov Guthrie in Treatment: 2 Subjective Chief Complaint Information obtained from Patient 08/31/2020; patient is here for review of wounds on his bilateral buttocks surrounding his coccyx History of  Present Illness (HPI) ADMISSION 08/31/2020 This is an 87 year old independent man who lives at home on his own. His problem started in August when he fell and fractured his right femur. He required an ORIF. He was sent to a rehab facility. At some point in this timeframe he developed bilateral pressure ulcers on his buttock. He was seen by dermatology Dr. Nehemiah Massed on 06/27/2020 noting the pressure injuries of his bilateral buttocks at the time treatment order for salicylic acid 3% ointment. Saw his primary doctor in the same timeframe. He is currently using a compounded ointment to the area including iodoquinol 1% / 0.25% hydrocortisone/2% niacinamide. I am not really sure how this is been progressing. The wounds are stage II on the bilateral buttocks surrounding the coccyx area. The patient has been changing his dressing himself. He is a very active man in fact had another fall in November suffering a periprosthetic hip fracture requiring additional pinning. He says actually after the second surgery he improved and he no longer has as much pain. The patient is here with his daughter. He also has a son that lives nearby. Past medical history includes systolic congestive heart failure, ischemic cardiomyopathy, right femur fracture and subsequent periprosthetic fracture as described, chronic kidney disease stage III, MGUS and abdominal aortic aneurysm 12/29; since the patient was last here he was apparently admitted with congestive heart failure. He was not given any wound care supplies he was discharged on Christmas day he apparently called here to get [name] to some orders and more of the collagen and apparently we did so but none never arrived. His son has been applying topical antibiotics. There is not too much change in the wound area on either side 09/28/2020; 2 small open areas on the mirror-image of the buttock bilaterally just below the coccyx level. This is in the fold of the gluteal cleft. Both  surface areas are a lot smaller we have been using silver collagen and foam dressing 10/19/2020; 2 small areas in the mirror image of the buttock bilaterally just below the coccyx level in the gluteal cleft. These are actually totally epithelialized today which is good to see READMISSION 03/01/2021 This is a patient we had earlier this year with 2 small mirror-image wounds on his bilateral buttocks  in close proximity to the gluteal cleft around his sacrum. We managed to get these to close over with meticulous pressure relief and collagen. He tells me everything was going well until he was hospitalized from 01/31/2021 through 02/08/2021 with congestive heart failure. He required IV diuretics. He was discharged on chronic oxygen. They have been using some collagen that they had leftover from last time but ran out of supplies. Once again the patient has 3 small areas all in close proximity to the lower sacrum/coccyx and the gluteal cleft. 2 on the left and a small area on the right. Although these have clean surfaces. The larger area on the left almost is shaped like an S, not really sure how he would have done this unless he lay on an oxygen cord in the hospital. Past medical history is essentially unchanged from above except that he now wears oxygen 6/29; patient presents for 2-week follow-up. He has been using collagen to the sacral wounds and aggressively offloading the area. He denies signs of infection and has no issues today. Patient History Information obtained from Patient. Family History Diabetes - Mother, No family history of Cancer, Heart Disease, Hereditary Spherocytosis, Hypertension, Kidney Disease, Lung Disease, Seizures, Stroke, Thyroid Problems, Tuberculosis. Social History Former smoker - quit 57 years ago, Alcohol Use - Rarely, Drug Use - No History, Caffeine Use - Daily. Medical History Eyes Denies history of Cataracts, Glaucoma, Optic Neuritis Ear/Nose/Mouth/Throat Denies  history of Chronic sinus problems/congestion, Middle ear problems Hematologic/Lymphatic Denies history of Anemia, Hemophilia, Human Immunodeficiency Virus, Lymphedema, Sickle Cell Disease Respiratory Planck, Bertis T. ([id number]) Denies history of Aspiration, Asthma, Chronic Obstructive Pulmonary Disease (COPD), Pneumothorax, Sleep Apnea, Tuberculosis Cardiovascular Patient has history of Coronary Artery Disease, Hypertension Denies history of Angina, Arrhythmia, Congestive Heart Failure, Deep Vein Thrombosis, Hypotension, Myocardial Infarction, Peripheral Arterial Disease, Peripheral Venous Disease, Phlebitis, Vasculitis Gastrointestinal Denies history of Cirrhosis , Colitis, Crohn s, Hepatitis A, Hepatitis B, Hepatitis C Endocrine Denies history of Type I Diabetes, Type II Diabetes Immunological Denies history of Lupus Erythematosus, Raynaud s, Scleroderma Integumentary (Skin) Patient has history of History of pressure wounds Denies history of History of Burn Musculoskeletal Denies history of Gout, Rheumatoid Arthritis, Osteoarthritis, Osteomyelitis Neurologic Denies history of Dementia, Neuropathy, Quadriplegia, Paraplegia, Seizure Disorder Psychiatric Denies history of Anorexia/bulimia, Confinement Anxiety Medical And Surgical History Notes Genitourinary stage 3 chronic kidney disease Objective Constitutional Vitals Time Taken: 10:50 AM, Height: 71 in, Weight: 164 lbs, BMI: 22.9, Temperature: 97.36 F, Pulse: 73 bpm, Respiratory Rate: 18 breaths/min, Blood Pressure: 113/75 mmHg. General Notes: Left buttocks: There are 2 open wounds That appear well-healing, with no signs of infection. Granulation tissue present, overall very clean. Integumentary (Hair, Skin) Wound #3 status is Open. Original cause of wound was Gradually Appeared. The date acquired was: 02/12/2021. The wound has been in treatment 2 weeks. The wound is located on the Proximal Sacrum. The wound measures 0.1cm  length x 0.1cm width x 0.1cm depth; 0.008cm^2 area and 0.001cm^3 volume. There is no tunneling or undermining noted. There is a none present amount of drainage noted. There is no granulation within the wound bed. There is no necrotic tissue within the wound bed. Wound #4 status is Open. Original cause of wound was Gradually Appeared. The date acquired was: 02/12/2021. The wound has been in treatment 2 weeks. The wound is located on the Distal Sacrum. The wound measures 0.3cm length x 0.2cm width x 0.1cm depth; 0.047cm^2 area and 0.005cm^3 volume. There is Fat Layer (Subcutaneous Tissue) exposed. There  is no tunneling or undermining noted. There is a medium amount of serosanguineous drainage noted. There is medium (34-66%) pink granulation within the wound bed. There is a medium (34-66%) amount of necrotic tissue within the wound bed including Eschar and Adherent Slough. Assessment Active Problems ICD-10 Pressure ulcer of sacral region, stage 2 Patient's wounds have improved nicely over the past 2 weeks. He still has 2 small open wounds to the left buttocks region. I recommended continuing collagen to this area with dressing changes. We also discussed continuing to aggressively offload the area. I suspect these will close up in 2 weeks. He has good support at home and his son changes the dressings. If these are closed In 2 weeks I gave him the option of not Peralta, Handy T. ([id number]) coming in if he did not want to and I told him to continue zinc oxide for the next 1 to 2 weeks while the area continues to heal. He was agreeable to this. Plan Follow-up Appointments: Return Appointment in 2 weeks. Bathing/ Shower/ Hygiene: May shower with wound dressing protected with water repellent cover or cast protector. Off-Loading: Turn and reposition every 2 hours - Keep pressure off of wounded areas WOUND #3: - Sacrum Wound Laterality: Proximal Cleanser: Normal Saline 3 x Per Week/30 Days Discharge  Instructions: Wash your hands with soap and water. Remove old dressing, discard into plastic bag and place into trash. Cleanse the wound with Normal Saline prior to applying a clean dressing using gauze sponges, not tissues or cotton balls. Do not scrub or use excessive force. Pat dry using gauze sponges, not tissue or cotton balls. Primary Dressing: Prisma 4.34 (in) (Generic) 3 x Per Week/30 Days Discharge Instructions: Moisten w/normal saline or sterile water; Cover wound as directed. Do not remove from wound bed. Secondary Dressing: Mepilex Border Flex, 4x4 (in/in) (Generic) 3 x Per Week/30 Days Discharge Instructions: Apply to wound as directed. Do not cut. WOUND #4: - Sacrum Wound Laterality: Distal Cleanser: Normal Saline 3 x Per Week/30 Days Discharge Instructions: Wash your hands with soap and water. Remove old dressing, discard into plastic bag and place into trash. Cleanse the wound with Normal Saline prior to applying a clean dressing using gauze sponges, not tissues or cotton balls. Do not scrub or use excessive force. Pat dry using gauze sponges, not tissue or cotton balls. Primary Dressing: Prisma 4.34 (in) (Generic) 3 x Per Week/30 Days Discharge Instructions: Moisten w/normal saline or sterile water; Cover wound as directed. Do not remove from wound bed. Secondary Dressing: Mepilex Border Flex, 4x4 (in/in) (Generic) 3 x Per Week/30 Days Discharge Instructions: Apply to wound as directed. Do not cut. 1. Continue collagen 2. Follow-up in 2 weeks Electronic Signature(s) Signed: 03/15/2021 11:25:38 AM By: Kalman Shan DO Entered By: Kalman Shan on 03/15/2021 11:24:27 Sparacino, [name] ([id number]) -------------------------------------------------------------------------------- ROS/PFSH Details Patient Name: James Prude T. Date of Service: 03/15/2021 10:45 AM Medical Record Number: [id number] Patient Account Number: [id number] Date of Birth/Sex: [birth year] (87 y.o.  M) Treating RN: Dolan Amen Primary Care Provider: Harrel Lemon Other Clinician: Referring Provider: Harrel Lemon Treating Provider/Extender: Yaakov Guthrie in Treatment: 2 Information Obtained From Patient Eyes Medical History: Negative for: Cataracts; Glaucoma; Optic Neuritis Ear/Nose/Mouth/Throat Medical History: Negative for: Chronic sinus problems/congestion; Middle ear problems Hematologic/Lymphatic Medical History: Negative for: Anemia; Hemophilia; Human Immunodeficiency Virus; Lymphedema; Sickle Cell Disease Respiratory Medical History: Negative for: Aspiration; Asthma; Chronic Obstructive Pulmonary Disease (COPD); Pneumothorax; Sleep Apnea; Tuberculosis Cardiovascular Medical History: Positive for: Coronary Artery Disease; Hypertension  Negative for: Angina; Arrhythmia; Congestive Heart Failure; Deep Vein Thrombosis; Hypotension; Myocardial Infarction; Peripheral Arterial Disease; Peripheral Venous Disease; Phlebitis; Vasculitis Gastrointestinal Medical History: Negative for: Cirrhosis ; Colitis; Crohnos; Hepatitis A; Hepatitis B; Hepatitis C Endocrine Medical History: Negative for: Type I Diabetes; Type II Diabetes Genitourinary Medical History: Past Medical History Notes: stage 3 chronic kidney disease Immunological Medical History: Negative for: Lupus Erythematosus; Raynaudos; Scleroderma Integumentary (Skin) Medical History: Positive for: History of pressure wounds Negative for: History of Burn Musculoskeletal JCION, BUDDENHAGEN ([id number]) Medical History: Negative for: Gout; Rheumatoid Arthritis; Osteoarthritis; Osteomyelitis Neurologic Medical History: Negative for: Dementia; Neuropathy; Quadriplegia; Paraplegia; Seizure Disorder Psychiatric Medical History: Negative for: Anorexia/bulimia; Confinement Anxiety Immunizations Pneumococcal Vaccine: Received Pneumococcal Vaccination: Yes Implantable Devices None Family and Social  History Cancer: No; Diabetes: Yes - Mother; Heart Disease: No; Hereditary Spherocytosis: No; Hypertension: No; Kidney Disease: No; Lung Disease: No; Seizures: No; Stroke: No; Thyroid Problems: No; Tuberculosis: No; Former smoker - quit 57 years ago; Alcohol Use: Rarely; Drug Use: No History; Caffeine Use: Daily Electronic Signature(s) Signed: 03/15/2021 11:25:38 AM By: Kalman Shan DO Signed: 03/15/2021 12:27:32 PM By: Georges Mouse, Minus Breeding RN Entered By: Kalman Shan on 03/15/2021 11:20:38 Demartini, [name] ([id number]) -------------------------------------------------------------------------------- SuperBill Details Patient Name: James Prude T. Date of Service: 03/15/2021 Medical Record Number: [id number] Patient Account Number: [id number] Date of Birth/Sex: 11-Aug-1931 (87 y.o. M) Treating RN: Dolan Amen Primary Care Provider: Harrel Lemon Other Clinician: Referring Provider: Harrel Lemon Treating Provider/Extender: Yaakov Guthrie in Treatment: 2 Diagnosis Coding ICD-10 Codes Code Description [phone] Pressure ulcer of sacral region, stage 2 Facility Procedures CPT4 Code: [id number] Description: 51761 - WOUND CARE VISIT-LEV 3 EST PT Modifier: Quantity: 1 Physician Procedures CPT4 Code: [id number] Description: 62694 - WC PHYS LEVEL 3 - EST PT Modifier: Quantity: 1 CPT4 Code: Description: ICD-10 Diagnosis Description L89.152 Pressure ulcer of sacral region, stage 2 Modifier: Quantity: Electronic Signature(s) Signed: 03/15/2021 11:25:38 AM By: Kalman Shan DO Entered By: Kalman Shan on 03/15/2021 11:25:11

## 2021-03-17 ENCOUNTER — Other Ambulatory Visit: Payer: Self-pay

## 2021-03-17 ENCOUNTER — Encounter: Payer: PPO | Attending: Internal Medicine | Admitting: *Deleted

## 2021-03-17 DIAGNOSIS — I5022 Chronic systolic (congestive) heart failure: Secondary | ICD-10-CM | POA: Insufficient documentation

## 2021-03-17 DIAGNOSIS — E7800 Pure hypercholesterolemia, unspecified: Secondary | ICD-10-CM | POA: Diagnosis not present

## 2021-03-17 DIAGNOSIS — E785 Hyperlipidemia, unspecified: Secondary | ICD-10-CM | POA: Diagnosis not present

## 2021-03-17 DIAGNOSIS — K219 Gastro-esophageal reflux disease without esophagitis: Secondary | ICD-10-CM | POA: Diagnosis not present

## 2021-03-17 DIAGNOSIS — J918 Pleural effusion in other conditions classified elsewhere: Secondary | ICD-10-CM | POA: Diagnosis not present

## 2021-03-17 DIAGNOSIS — F419 Anxiety disorder, unspecified: Secondary | ICD-10-CM | POA: Diagnosis not present

## 2021-03-17 DIAGNOSIS — D472 Monoclonal gammopathy: Secondary | ICD-10-CM | POA: Diagnosis not present

## 2021-03-17 DIAGNOSIS — I130 Hypertensive heart and chronic kidney disease with heart failure and stage 1 through stage 4 chronic kidney disease, or unspecified chronic kidney disease: Secondary | ICD-10-CM | POA: Diagnosis not present

## 2021-03-17 DIAGNOSIS — I2510 Atherosclerotic heart disease of native coronary artery without angina pectoris: Secondary | ICD-10-CM | POA: Diagnosis not present

## 2021-03-17 DIAGNOSIS — D631 Anemia in chronic kidney disease: Secondary | ICD-10-CM | POA: Diagnosis not present

## 2021-03-17 DIAGNOSIS — I5023 Acute on chronic systolic (congestive) heart failure: Secondary | ICD-10-CM | POA: Diagnosis not present

## 2021-03-17 DIAGNOSIS — N1832 Chronic kidney disease, stage 3b: Secondary | ICD-10-CM | POA: Diagnosis not present

## 2021-03-17 DIAGNOSIS — I493 Ventricular premature depolarization: Secondary | ICD-10-CM | POA: Diagnosis not present

## 2021-03-17 DIAGNOSIS — J9601 Acute respiratory failure with hypoxia: Secondary | ICD-10-CM | POA: Diagnosis not present

## 2021-03-17 NOTE — Progress Notes (Signed)
[name] ([id number]) Visit Report for 03/15/2021 Arrival Information Details Patient Name: [name], [name]. Date of Service: 03/15/2021 10:45 AM Medical Record Number: [id number] Patient Account Number: [id number] Date of Birth/Sex: 1931-09-16 (87 y.o. M) Treating Holt: Donnamarie Poag Primary Care [name]: [name] Other Clinician: Referring [name]: [name] Treating [name]/Extender: [name] in Treatment: 2 Visit Information History Since Last Visit Added or deleted any medications: No Patient Arrived: Wheel Chair Had a fall or experienced change in No Arrival Time: 10:51 activities of daily living that may affect Accompanied By: daughter risk of falls: Transfer Assistance: [employer] Patient Hospitalized since last visit: No Lift Has Dressing in Place as Prescribed: Yes Patient Identification Verified: Yes Pain Present Now: No Secondary Verification Process Completed: Yes Patient Requires Transmission-Based No Precautions: Patient Has Alerts: No Electronic Signature(s) Signed: 03/15/2021 3:28:20 PM By: Donnamarie Poag Entered By: Donnamarie Poag on 03/15/2021 10:51:40 [name] ([id number]) -------------------------------------------------------------------------------- Clinic Level of Care Assessment Details Patient Name: James Prude T. Date of Service: 03/15/2021 10:45 AM Medical Record Number: [id number] Patient Account Number: [id number] Date of Birth/Sex: October 19, 1930 (87 y.o. M) Treating Holt: [name] Primary Care [name]: [name] Other Clinician: Referring [name]: [name] Treating [name]/Extender: [name] in Treatment: 2 Clinic Level of Care Assessment Items TOOL 4 Quantity Score X - Use when only an EandM is performed on FOLLOW-UP visit 1 0 ASSESSMENTS - Nursing Assessment / Reassessment X - Reassessment of Co-morbidities (includes updates in patient status) 1 10 X- 1 5 Reassessment of  Adherence to Treatment Plan ASSESSMENTS - Wound and Skin Assessment / Reassessment []  - Simple Wound Assessment / Reassessment - one wound 0 X- 2 5 Complex Wound Assessment / Reassessment - multiple wounds []  - 0 Dermatologic / Skin Assessment (not related to wound area) ASSESSMENTS - Focused Assessment []  - Circumferential Edema Measurements - multi extremities 0 []  - 0 Nutritional Assessment / Counseling / Intervention []  - 0 Lower Extremity Assessment (monofilament, tuning fork, pulses) []  - 0 Peripheral Arterial Disease Assessment (using hand held doppler) ASSESSMENTS - Ostomy and/or Continence Assessment and Care []  - Incontinence Assessment and Management 0 []  - 0 Ostomy Care Assessment and Management (repouching, etc.) PROCESS - Coordination of Care X - Simple Patient / Family Education for ongoing care 1 15 []  - 0 Complex (extensive) Patient / Family Education for ongoing care []  - 0 Staff obtains Programmer, systems, Records, Test Results / Process Orders []  - 0 Staff telephones HHA, Nursing Homes / Clarify orders / etc []  - 0 Routine Transfer to another Facility (non-emergent condition) []  - 0 Routine Hospital Admission (non-emergent condition) []  - 0 New Admissions / Biomedical engineer / Ordering NPWT, Apligraf, etc. []  - 0 Emergency Hospital Admission (emergent condition) X- 1 10 Simple Discharge Coordination []  - 0 Complex (extensive) Discharge Coordination PROCESS - Special Needs []  - Pediatric / Minor Patient Management 0 []  - 0 Isolation Patient Management []  - 0 Hearing / Language / Visual special needs []  - 0 Assessment of Community assistance (transportation, D/C planning, etc.) []  - 0 Additional assistance / Altered mentation []  - 0 Support Surface(s) Assessment (bed, cushion, seat, etc.) INTERVENTIONS - Wound Cleansing / Measurement Cerezo, James T. ([id number]) []  - 0 Simple Wound Cleansing - one wound X- 2 5 Complex Wound Cleansing - multiple  wounds X- 1 5 Wound Imaging (photographs - any number of wounds) []  - 0 Wound Tracing (instead of photographs) []  - 0 Simple Wound Measurement - one wound X- 2 5  Complex Wound Measurement - multiple wounds INTERVENTIONS - Wound Dressings []  - Small Wound Dressing one or multiple wounds 0 X- 1 15 Medium Wound Dressing one or multiple wounds []  - 0 Large Wound Dressing one or multiple wounds []  - 0 Application of Medications - topical []  - 0 Application of Medications - injection INTERVENTIONS - Miscellaneous []  - External ear exam 0 []  - 0 Specimen Collection (cultures, biopsies, blood, body fluids, etc.) []  - 0 Specimen(s) / Culture(s) sent or taken to Lab for analysis []  - 0 Patient Transfer (multiple staff / [name] Lift / Similar devices) []  - 0 Simple Staple / Suture removal (25 or less) []  - 0 Complex Staple / Suture removal (26 or more) []  - 0 Hypo / Hyperglycemic Management (close monitor of Blood Glucose) []  - 0 Ankle / Brachial Index (ABI) - do not check if billed separately X- 1 5 Vital Signs Has the patient been seen at the hospital within the last three years: Yes Total Score: 95 Level Of Care: New/Established - Level 3 Electronic Signature(s) Signed: 03/15/2021 12:27:32 PM By: [name], [name] Entered By: [name], Kenia on 03/15/2021 11:18:55 [name], [name] ([id number]) -------------------------------------------------------------------------------- Encounter Discharge Information Details Patient Name: James Prude T. Date of Service: 03/15/2021 10:45 AM Medical Record Number: [id number] Patient Account Number: [id number] Date of Birth/Sex: [birth year] (87 y.o. M) Treating Holt: [name] Primary Care Shalandria Elsbernd: [name] Other Clinician: Referring [name]: [name] Treating [name]/Extender: [name] in Treatment: 2 Encounter Discharge Information Items Discharge Condition: Stable Ambulatory Status:  Wheelchair Discharge Destination: Home Transportation: Private Auto Accompanied By: daughter Schedule Follow-up Appointment: Yes Clinical Summary of Care: Patient Declined Electronic Signature(s) Signed: 03/16/2021 6:25:03 PM By: James Coria Holt Entered By: [name] on 03/15/2021 11:29:55 [name], [name] ([id number]) -------------------------------------------------------------------------------- Lower Extremity Assessment Details Patient Name: James Prude T. Date of Service: 03/15/2021 10:45 AM Medical Record Number: [id number] Patient Account Number: [id number] Date of Birth/Sex: 01-27-1931 (87 y.o. M) Treating Holt: Donnamarie Poag Primary Care Stormie Ventola: [name] Other Clinician: Referring Kamiah Fite: [name] Treating Montanna Mcbain/Extender: [name] in Treatment: 2 Electronic Signature(s) Signed: 03/15/2021 3:28:20 PM By: Donnamarie Poag Entered By: Donnamarie Poag on 03/15/2021 11:03:32 [name], [name] ([id number]) -------------------------------------------------------------------------------- Multi Wound Chart Details Patient Name: James Prude T. Date of Service: 03/15/2021 10:45 AM Medical Record Number: [id number] Patient Account Number: [id number] Date of Birth/Sex: August 10, 1931 (87 y.o. M) Treating Holt: [name] Primary Care Kymora Sciara: [name] Other Clinician: Referring Griselda Bramblett: [name] Treating Brittnye Josephs/Extender: [name] in Treatment: 2 Vital Signs Height(in): 71 Pulse(bpm): 61 Weight(lbs): 164 Blood Pressure(mmHg): 113/75 Body Mass Index(BMI): 23 Temperature(F): 97.36 Respiratory Rate(breaths/min): 18 Photos: [N/A:N/A] Wound Location: Proximal Sacrum Distal Sacrum N/A Wounding Event: Gradually Appeared Gradually Appeared N/A Primary Etiology: Pressure Ulcer Pressure Ulcer N/A Comorbid History: Coronary Artery Disease, Coronary Artery Disease, N/A Hypertension, History of pressure Hypertension, History of  pressure wounds wounds Date Acquired: 02/12/2021 02/12/2021 N/A Weeks of Treatment: 2 2 N/A Wound Status: Open Open N/A Measurements L x W x D (cm) 0.1x0.1x0.1 0.3x0.2x0.1 N/A Area (cm) : 0.008 0.047 N/A Volume (cm) : 0.001 0.005 N/A % Reduction in Area: 97.60% 98.90% N/A % Reduction in Volume: 97.00% 98.80% N/A Classification: Category/Stage III Category/Stage III N/A Exudate Amount: None Present Medium N/A Exudate Type: N/A Serosanguineous N/A Exudate Color: N/A red, brown N/A Granulation Amount: None Present (0%) Medium (34-66%) N/A Granulation Quality: N/A Pink N/A Necrotic Amount: None Present (0%) Medium (34-66%) N/A Necrotic Tissue: N/A Eschar, Adherent  Slough N/A Exposed Structures: Fascia: No Fat Layer (Subcutaneous Tissue): N/A Fat Layer (Subcutaneous Tissue): Yes No Fascia: No Tendon: No Tendon: No Muscle: No Muscle: No Joint: No Joint: No Bone: No Bone: No Epithelialization: Large (67-100%) Medium (34-66%) N/A Treatment Notes Electronic Signature(s) Signed: 03/15/2021 11:25:38 AM By: Kalman Shan DO Entered By: Kalman Shan on 03/15/2021 11:19:45 [name], [name] ([id number]) -------------------------------------------------------------------------------- Multi-Disciplinary Care Plan Details Patient Name: James Prude T. Date of Service: 03/15/2021 10:45 AM Medical Record Number: [id number] Patient Account Number: [id number] Date of Birth/Sex: [birth year] (87 y.o. M) Treating Holt: [name] Primary Care Lorrie Gargan: [name] Other Clinician: Referring Leta Bucklin: [name] Treating Amyri Frenz/Extender: [name] in Treatment: 2 Active Inactive Pressure Nursing Diagnoses: Knowledge deficit related to causes and risk factors for pressure ulcer development Knowledge deficit related to management of pressures ulcers Goals: Patient will remain free from development of additional pressure ulcers Date Initiated: 03/01/2021 Target  Resolution Date: 03/22/2021 Goal Status: Active Patient will remain free of pressure ulcers Date Initiated: 03/01/2021 Target Resolution Date: 03/22/2021 Goal Status: Active Patient/caregiver will verbalize risk factors for pressure ulcer development Date Initiated: 03/01/2021 Target Resolution Date: 03/01/2021 Goal Status: Active Patient/caregiver will verbalize understanding of pressure ulcer management Date Initiated: 03/01/2021 Target Resolution Date: 03/01/2021 Goal Status: Active Interventions: Assess: immobility, friction, shearing, incontinence upon admission and as needed Assess offloading mechanisms upon admission and as needed Assess potential for pressure ulcer upon admission and as needed Provide education on pressure ulcers Notes: Wound/Skin Impairment Nursing Diagnoses: Impaired tissue integrity Knowledge deficit related to ulceration/compromised skin integrity Goals: Patient/caregiver will verbalize understanding of skin care regimen Date Initiated: 03/01/2021 Target Resolution Date: 03/08/2021 Goal Status: Active Ulcer/skin breakdown will have a volume reduction of 30% by week 4 Date Initiated: 03/01/2021 Target Resolution Date: 03/29/2021 Goal Status: Active Interventions: Assess patient/caregiver ability to obtain necessary supplies Assess patient/caregiver ability to perform ulcer/skin care regimen upon admission and as needed Assess ulceration(s) every visit Provide education on ulcer and skin care Treatment Activities: Skin care regimen initiated : 03/01/2021 Topical wound management initiated : 03/01/2021 Notes: [name], [name] ([id number]) Electronic Signature(s) Signed: 03/15/2021 12:27:32 PM By: [name], [name] Entered By: [name], James Breeding on 03/15/2021 11:17:05 Trego, [name] ([id number]) -------------------------------------------------------------------------------- Pain Assessment Details Patient Name: James Prude T. Date of  Service: 03/15/2021 10:45 AM Medical Record Number: [id number] Patient Account Number: [id number] Date of Birth/Sex: Feb 01, 1931 (87 y.o. M) Treating Holt: Donnamarie Poag Primary Care Legacy Carrender: [name] Other Clinician: Referring Babe Anthis: [name] Treating Pheng Prokop/Extender: [name] in Treatment: 2 Active Problems Location of Pain Severity and Description of Pain Patient Has Paino Yes Site Locations Rate the pain. Current Pain Level: 0 Pain Management and Medication Current Pain Management: Electronic Signature(s) Signed: 03/15/2021 3:28:20 PM By: Donnamarie Poag Entered By: Donnamarie Poag on 03/15/2021 10:58:14 [name], [name] ([id number]) -------------------------------------------------------------------------------- Patient/Caregiver Education Details Patient Name: James Prude T. Date of Service: 03/15/2021 10:45 AM Medical Record Number: [id number] Patient Account Number: [id number] Date of Birth/Gender: 03/19/1931 (87 y.o. M) Treating Holt: [name] Primary Care Physician: [name] Other Clinician: Referring Physician: Harrel Holt Treating Physician/Extender: [name] in Treatment: 2 Education Assessment Education Provided To: Patient Education Topics Provided Wound/Skin Impairment: Methods: Explain/Verbal Responses: State content correctly Electronic Signature(s) Signed: 03/15/2021 12:27:32 PM By: [name], [name] Entered By: [name], James Breeding on 03/15/2021 11:19:06 [name], [name] ([id number]) -------------------------------------------------------------------------------- Wound Assessment Details Patient Name: James Prude T. Date of Service: 03/15/2021 10:45 AM Medical Record Number: [id number] Patient  Account Number: [id number] Date of Birth/Sex: 03-15-1931 (87 y.o. M) Treating Holt: Donnamarie Poag Primary Care Fidel Caggiano: [name] Other Clinician: Referring Blen Ransome: [name] Treating  Tanna Loeffler/Extender: [name] in Treatment: 2 Wound Status Wound Number: 3 Primary Pressure Ulcer Etiology: Wound Location: Proximal Sacrum Wound Status: Open Wounding Event: Gradually Appeared Comorbid Coronary Artery Disease, Hypertension, History of Date Acquired: 02/12/2021 History: pressure wounds Weeks Of Treatment: 2 Clustered Wound: No Photos Wound Measurements Length: (cm) 0.1 Width: (cm) 0.1 Depth: (cm) 0.1 Area: (cm) 0.008 Volume: (cm) 0.001 % Reduction in Area: 97.6% % Reduction in Volume: 97% Epithelialization: Large (67-100%) Tunneling: No Undermining: No Wound Description Classification: Category/Stage III Exudate Amount: None Present Foul Odor After Cleansing: No Slough/Fibrino No Wound Bed Granulation Amount: None Present (0%) Exposed Structure Necrotic Amount: None Present (0%) Fascia Exposed: No Fat Layer (Subcutaneous Tissue) Exposed: No Tendon Exposed: No Muscle Exposed: No Joint Exposed: No Bone Exposed: No Treatment Notes Wound #3 (Sacrum) Wound Laterality: Proximal Cleanser Normal Saline Discharge Instruction: Wash your hands with soap and water. Remove old dressing, discard into plastic bag and place into trash. Cleanse the wound with Normal Saline prior to applying a clean dressing using gauze sponges, not tissues or cotton balls. Do not scrub or use excessive force. Pat dry using gauze sponges, not tissue or cotton balls. Peri-Wound Care [place], [name] ([id number]) Topical Primary Dressing Prisma 4.34 (in) Discharge Instruction: Moisten w/normal saline or sterile water; Cover wound as directed. Do not remove from wound bed. Secondary Dressing Mepilex Border Flex, 4x4 (in/in) Discharge Instruction: Apply to wound as directed. Do not cut. Secured With Compression Wrap Compression Stockings Add-Ons Electronic Signature(s) Signed: 03/15/2021 3:28:20 PM By: Donnamarie Poag Entered By: Donnamarie Poag on 03/15/2021  11:01:33 [name], [name] ([id number]) -------------------------------------------------------------------------------- Wound Assessment Details Patient Name: James Prude T. Date of Service: 03/15/2021 10:45 AM Medical Record Number: [id number] Patient Account Number: [id number] Date of Birth/Sex: [birth year] (87 y.o. M) Treating Holt: Donnamarie Poag Primary Care Donnamae Muilenburg: [name] Other Clinician: Referring [name]: [name] Treating Alcario Tinkey/Extender: [name] in Treatment: 2 Wound Status Wound Number: 4 Primary Pressure Ulcer Etiology: Wound Location: Distal Sacrum Wound Status: Open Wounding Event: Gradually Appeared Comorbid Coronary Artery Disease, Hypertension, History of Date Acquired: 02/12/2021 History: pressure wounds Weeks Of Treatment: 2 Clustered Wound: No Photos Wound Measurements Length: (cm) 0.3 Width: (cm) 0.2 Depth: (cm) 0.1 Area: (cm) 0.047 Volume: (cm) 0.005 % Reduction in Area: 98.9% % Reduction in Volume: 98.8% Epithelialization: Medium (34-66%) Tunneling: No Undermining: No Wound Description Classification: Category/Stage III Exudate Amount: Medium Exudate Type: Serosanguineous Exudate Color: red, brown Foul Odor After Cleansing: No Slough/Fibrino Yes Wound Bed Granulation Amount: Medium (34-66%) Exposed Structure Granulation Quality: Pink Fascia Exposed: No Necrotic Amount: Medium (34-66%) Fat Layer (Subcutaneous Tissue) Exposed: Yes Necrotic Quality: Eschar, Adherent Slough Tendon Exposed: No Muscle Exposed: No Joint Exposed: No Bone Exposed: No Treatment Notes Wound #4 (Sacrum) Wound Laterality: Distal Cleanser Normal Saline Discharge Instruction: Wash your hands with soap and water. Remove old dressing, discard into plastic bag and place into trash. Cleanse the wound with Normal Saline prior to applying a clean dressing using gauze sponges, not tissues or cotton balls. Do not scrub or use excessive force.  Pat dry using gauze sponges, not tissue or cotton balls. Thammavong, RAMEEN QUINNEY ([id number]) Peri-Wound Care Topical Primary Dressing Prisma 4.34 (in) Discharge Instruction: Moisten w/normal saline or sterile water; Cover wound as directed. Do not remove from wound bed. Secondary Dressing Mepilex Border Flex, 4x4 (in/in)  Discharge Instruction: Apply to wound as directed. Do not cut. Secured With Compression Wrap Compression Stockings Add-Ons Electronic Signature(s) Signed: 03/15/2021 3:28:20 PM By: Donnamarie Poag Entered By: Donnamarie Poag on 03/15/2021 11:03:00 Macbeth, [name] ([id number]) -------------------------------------------------------------------------------- Vitals Details Patient Name: James Prude T. Date of Service: 03/15/2021 10:45 AM Medical Record Number: [id number] Patient Account Number: [id number] Date of Birth/Sex: March 27, 1931 (87 y.o. M) Treating Holt: Donnamarie Poag Primary Care Arley Garant: [name] Other Clinician: Referring Josua Ferrebee: [name] Treating Samyah Bilbo/Extender: [name] in Treatment: 2 Vital Signs Time Taken: 10:50 Temperature (F): 97.36 Height (in): 71 Pulse (bpm): 73 Weight (lbs): 164 Respiratory Rate (breaths/min): 18 Body Mass Index (BMI): 22.9 Blood Pressure (mmHg): 113/75 Reference Range: 80 - 120 mg / dl Electronic Signature(s) Signed: 03/15/2021 3:28:20 PM By: Donnamarie Poag Entered ByDonnamarie Poag on 03/15/2021 10:58:04

## 2021-03-17 NOTE — Progress Notes (Signed)
Initial telephone orientation completed. Diagnosis can be found in [facility] 6/9. EP orientation scheduled for Thursday 7/14 at 2:30

## 2021-03-19 LAB — ACID FAST CULTURE WITH REFLEXED SENSITIVITIES (MYCOBACTERIA): Acid Fast Culture: NEGATIVE

## 2021-03-21 ENCOUNTER — Ambulatory Visit
Admission: RE | Admit: 2021-03-21 | Discharge: 2021-03-21 | Disposition: A | Discharge status: Home / Self Care | Payer: PPO | Source: Ambulatory Visit | Attending: Internal Medicine | Admitting: Internal Medicine

## 2021-03-21 ENCOUNTER — Other Ambulatory Visit (HOSPITAL_COMMUNITY): Payer: Self-pay | Admitting: Internal Medicine

## 2021-03-21 ENCOUNTER — Other Ambulatory Visit: Payer: Self-pay | Admitting: Internal Medicine

## 2021-03-21 DIAGNOSIS — M79662 Pain in left lower leg: Secondary | ICD-10-CM

## 2021-03-21 DIAGNOSIS — M7989 Other specified soft tissue disorders: Secondary | ICD-10-CM

## 2021-03-24 DIAGNOSIS — I6523 Occlusion and stenosis of bilateral carotid arteries: Secondary | ICD-10-CM | POA: Diagnosis not present

## 2021-03-24 DIAGNOSIS — I2510 Atherosclerotic heart disease of native coronary artery without angina pectoris: Secondary | ICD-10-CM | POA: Diagnosis not present

## 2021-03-24 DIAGNOSIS — E782 Mixed hyperlipidemia: Secondary | ICD-10-CM | POA: Diagnosis not present

## 2021-03-24 DIAGNOSIS — I350 Nonrheumatic aortic (valve) stenosis: Secondary | ICD-10-CM | POA: Diagnosis not present

## 2021-03-24 DIAGNOSIS — I493 Ventricular premature depolarization: Secondary | ICD-10-CM | POA: Diagnosis not present

## 2021-03-24 DIAGNOSIS — I255 Ischemic cardiomyopathy: Secondary | ICD-10-CM | POA: Diagnosis not present

## 2021-03-24 DIAGNOSIS — I10 Essential (primary) hypertension: Secondary | ICD-10-CM | POA: Diagnosis not present

## 2021-03-27 DIAGNOSIS — J3489 Other specified disorders of nose and nasal sinuses: Secondary | ICD-10-CM | POA: Diagnosis not present

## 2021-03-27 DIAGNOSIS — R040 Epistaxis: Secondary | ICD-10-CM | POA: Diagnosis not present

## 2021-03-28 NOTE — Progress Notes (Signed)
Patient ID: [name], male    DOB: 16-Sep-1931, 87 y.o.   MRN: [id number]  HPI  [name] is a 87 y/o male with a history of CAD, hyperlipidemia, HTN, CKD, aneami, aortic aneurysm, GERD, MGUS, previous tobacco use and chronic heart failure.   Echo report from 01/31/21 reviewed and showed an EF of <20% along with mild/moderate James and mild AS.   Was in the ED 03/09/21 due to constipation where he was treated and released.   He presents today for his initial visit with a chief complaint of moderate fatigue upon minimal exertion. He describes this as having been present for several months. He has associated nasal congestion, cough, shortness of breath, pedal edema and easy bruising along with this. He denies any difficulty sleeping, abdominal distention, palpitations, chest pain, dizziness or weight gain.   He says that he always has more swelling in his left leg versus his right. Has worn compression socks in the past but admits that he has great difficulty in getting them on. Does do walking at home and would like to go back to cardiac rehab program for a more structured exercise program.   Past Medical History:  Diagnosis Date   Anemia    Aortic aneurysm (HCC)    Aortic aneurysm (HCC)    Arthritis    CHF (congestive heart failure) (HCC)    CKD (chronic kidney disease)    ALSO LEFT RENAL MASS   Coronary artery disease    Diverticulosis    WITH RUPTURE   GERD (gastroesophageal reflux disease)    History of hiatal hernia    Hypercholesteremia    Hypertension    MGUS (monoclonal gammopathy of unknown significance)    MGUS (monoclonal gammopathy of unknown significance)    Past Surgical History:  Procedure Laterality Date   AORTIC VALVE REPLACEMENT     bipass     CARDIAC VALVE REPLACEMENT     pig valve   CHOLECYSTECTOMY     COLON SURGERY     CORONARY ANGIOPLASTY     CORONARY ARTERY BYPASS GRAFT     ESOPHAGOGASTRODUODENOSCOPY (EGD) WITH PROPOFOL N/A 02/07/2015   Procedure:  ESOPHAGOGASTRODUODENOSCOPY (EGD) WITH PROPOFOL;  Surgeon: Hulen Luster, MD;  Location: [facility];  Service: Gastroenterology;  Laterality: N/A;   EYE SURGERY     HERNIA REPAIR     INTRAMEDULLARY (IM) NAIL INTERTROCHANTERIC Right 05/12/2020   Procedure: INTRAMEDULLARY (IM) NAIL INTERTROCHANTRIC;  Surgeon: Lovell Sheehan, MD;  Location: [facility];  Service: Orthopedics;  Laterality: Right;   JOINT REPLACEMENT     left knee x4   KNEE ARTHROPLASTY Right 04/02/2016   Procedure: COMPUTER ASSISTED TOTAL KNEE ARTHROPLASTY;  Surgeon: Dereck Leep, MD;  Location: [facility];  Service: Orthopedics;  Laterality: Right;   ORIF FEMUR FRACTURE Right 08/08/2020   Procedure: OPEN REDUCTION INTERNAL FIXATION (ORIF) DISTAL FEMUR FRACTURE;  Surgeon: Lovell Sheehan, MD;  Location: [facility];  Service: Orthopedics;  Laterality: Right;   Family History  Problem Relation Age of Onset   Bladder Cancer Neg Hx    Prostate cancer Neg Hx    Kidney cancer Neg Hx    Social History   Tobacco Use   Smoking status: Former    Pack years: 0.00   Smokeless tobacco: Never  Substance Use Topics   Alcohol use: No   Allergies  Allergen Reactions   Morphine Anaphylaxis   Oxycodone Other (See Comments)    Hypotension and bradycardia   Carvedilol  Dizziness and syncope    Iodinated Diagnostic Agents Rash    Other reaction(s): Asthenia (finding), Other (qualifier value) paralyzed legs Other reaction(s): RASH    Lisinopril Cough and Other (See Comments)   Prior to Admission medications   Medication Sig Start Date End Date Taking? Authorizing Provider  albuterol (VENTOLIN HFA) 108 (90 Base) MCG/ACT inhaler Inhale into the lungs. 01/16/21 01/16/22 Yes [provider]  amiodarone (PACERONE) 200 MG tablet Take 1 tablet (200 mg total) by mouth 2 (two) times daily. 02/08/21  Yes Wouk, Ailene Rud, MD  aspirin EC 81 MG tablet Take 1 tablet (81 mg total) by mouth 2 (two) times daily. Swallow whole. 08/10/20  Yes  Donne Hazel, MD  fluticasone [facility]) 50 MCG/ACT nasal spray USE 2 SPRAYS INTO BOTH NOSTRILS DAILY 10/11/20  Yes [provider]  losartan (COZAAR) 25 MG tablet Take 1 tablet (25 mg total) by mouth daily. 02/08/21  Yes Wouk, Ailene Rud, MD  Melatonin 3 MG TABS Take 1.5 mg by mouth at bedtime as needed (sleep).   Yes [provider]  pravastatin (PRAVACHOL) 20 MG tablet Take 20 mg by mouth every evening.   Yes [provider]  torsemide (DEMADEX) 20 MG tablet Take 2 tablets (40 mg total) by mouth daily. 09/10/20  Yes Lorella Nimrod, MD  traMADol (ULTRAM) 50 MG tablet Take 50 mg by mouth every 4 (four) hours as needed. 08/16/20  Yes [provider]  ALPRAZolam (XANAX) 0.25 MG tablet Take 0.25 mg by mouth 2 (two) times daily as needed. Patient not taking: Reported on 03/29/2021 08/26/20   [provider]  potassium chloride SA (KLOR-CON) 20 MEQ tablet Take 2 tablets (40 mEq total) by mouth daily. Patient not taking: Reported on 03/29/2021 02/08/21   Gwynne Edinger, MD   Review of Systems  Constitutional:  Positive for fatigue (tire easily). Negative for appetite change.  HENT:  Positive for congestion. Negative for postnasal drip and sore throat.   Eyes: Negative.   Respiratory:  Positive for cough and shortness of breath. Negative for chest tightness.   Cardiovascular:  Positive for leg swelling. Negative for chest pain and palpitations.  Gastrointestinal:  Negative for abdominal distention and abdominal pain.  Endocrine: Negative.   Genitourinary: Negative.   Musculoskeletal:  Positive for arthralgias (left knee).  Skin: Negative.   Allergic/Immunologic: Negative.   Neurological:  Negative for dizziness and light-headedness.  Hematological:  Negative for adenopathy. Bruises/bleeds easily.  Psychiatric/Behavioral:  Negative for dysphoric mood and sleep disturbance (wearing oxygen @2L  at bedtime and PRN during the day). The patient is not  nervous/anxious.    Vitals:   03/29/21 1249  BP: 108/71  Pulse: 72  Resp: 16  SpO2: 100%  Weight: 161 lb (73 kg)  Height: 5\' 11"  (1.803 m)   Wt Readings from Last 3 Encounters:  03/29/21 161 lb (73 kg)  02/08/21 162 lb 0.6 oz (73.5 kg)  09/10/20 164 lb 0.4 oz (74.4 kg)   Lab Results  Component Value Date   CREATININE 2.71 (H) 03/09/2021   CREATININE 2.46 (H) 03/01/2021   CREATININE 3.72 (H) 02/22/2021    Physical Exam Vitals and nursing note reviewed. Exam conducted with a chaperone present (daughter-in-law).  Constitutional:      Appearance: Normal appearance.  HENT:     Head: Normocephalic and atraumatic.  Cardiovascular:     Rate and Rhythm: Normal rate and regular rhythm.  Pulmonary:     Effort: Pulmonary effort is normal. No respiratory distress.  Breath sounds: No wheezing or rales.  Abdominal:     General: There is no distension.     Palpations: Abdomen is soft.     Tenderness: There is no abdominal tenderness.  Musculoskeletal:        General: No tenderness.     Cervical back: Normal range of motion and neck supple.     Right lower leg: Edema (2+ pitting) present.     Left lower leg: Edema (2+ pitting) present.  Skin:    General: Skin is warm and dry.  Neurological:     General: No focal deficit present.     Mental Status: He is alert and oriented to person, place, and time.  Psychiatric:        Mood and Affect: Mood normal.        Behavior: Behavior normal.        Thought Content: Thought content normal.    Assessment & Plan:  1: Chronic heart failure with reduced ejection fraction- - NYHA class III - euvolemic today - weighing daily; reminded to call for an overnight weight gain of > 2 pounds or a weekly weight gain of >5 pounds - not adding salt to his food and family doesn't cook with salt - saw cardiology Nehemiah Massed) 03/24/21 - on GDMT of losartan - had dizziness/ syncope with carvedilol - renal function will not allow for spironolactone or  SGLT2 - drinking ~48 ounces of fluid/ day per nephrology - BNP 01/31/21 was >4500.00  2: HTN- - BP looks good (108/71) - saw PCP Edwina Barth) 03/21/21 - BMP 03/09/21 reviewed and showed sodium 130, potassium 3.2, creatinine 2.71 and GFR 22  3: Lymphedema- - edema improves some over night - has worn compression socks in the past but not recently; willing to try wearing them again - has been exercising by walking at home - consider lymphapress compression boots if edema persists   Medication list reviewed.  Return in 6 weeks or sooner for any questions/problems before then.

## 2021-03-29 ENCOUNTER — Encounter: Payer: Self-pay | Admitting: Family

## 2021-03-29 ENCOUNTER — Ambulatory Visit: Payer: PPO | Attending: Family | Admitting: Family

## 2021-03-29 ENCOUNTER — Other Ambulatory Visit: Payer: Self-pay

## 2021-03-29 VITALS — BP 108/71 | HR 72 | Resp 16 | Ht 71.0 in | Wt 161.0 lb

## 2021-03-29 DIAGNOSIS — Z888 Allergy status to other drugs, medicaments and biological substances status: Secondary | ICD-10-CM | POA: Insufficient documentation

## 2021-03-29 DIAGNOSIS — I2510 Atherosclerotic heart disease of native coronary artery without angina pectoris: Secondary | ICD-10-CM | POA: Diagnosis not present

## 2021-03-29 DIAGNOSIS — I10 Essential (primary) hypertension: Secondary | ICD-10-CM

## 2021-03-29 DIAGNOSIS — Z885 Allergy status to narcotic agent status: Secondary | ICD-10-CM | POA: Diagnosis not present

## 2021-03-29 DIAGNOSIS — Z7982 Long term (current) use of aspirin: Secondary | ICD-10-CM | POA: Insufficient documentation

## 2021-03-29 DIAGNOSIS — Z951 Presence of aortocoronary bypass graft: Secondary | ICD-10-CM | POA: Diagnosis not present

## 2021-03-29 DIAGNOSIS — N189 Chronic kidney disease, unspecified: Secondary | ICD-10-CM | POA: Diagnosis not present

## 2021-03-29 DIAGNOSIS — Z79899 Other long term (current) drug therapy: Secondary | ICD-10-CM | POA: Diagnosis not present

## 2021-03-29 DIAGNOSIS — Z952 Presence of prosthetic heart valve: Secondary | ICD-10-CM | POA: Insufficient documentation

## 2021-03-29 DIAGNOSIS — I890 Lymphedema, not elsewhere classified: Secondary | ICD-10-CM | POA: Diagnosis not present

## 2021-03-29 DIAGNOSIS — Z87891 Personal history of nicotine dependence: Secondary | ICD-10-CM | POA: Diagnosis not present

## 2021-03-29 DIAGNOSIS — I130 Hypertensive heart and chronic kidney disease with heart failure and stage 1 through stage 4 chronic kidney disease, or unspecified chronic kidney disease: Secondary | ICD-10-CM | POA: Diagnosis not present

## 2021-03-29 DIAGNOSIS — E7800 Pure hypercholesterolemia, unspecified: Secondary | ICD-10-CM | POA: Insufficient documentation

## 2021-03-29 DIAGNOSIS — I5022 Chronic systolic (congestive) heart failure: Secondary | ICD-10-CM | POA: Insufficient documentation

## 2021-03-29 DIAGNOSIS — E785 Hyperlipidemia, unspecified: Secondary | ICD-10-CM | POA: Insufficient documentation

## 2021-03-29 NOTE — Patient Instructions (Signed)
Continue weighing daily and call for an overnight weight gain of > 2 pounds or a weekly weight gain of >5 pounds. 

## 2021-03-30 ENCOUNTER — Ambulatory Visit: Payer: PPO | Admitting: Physician Assistant

## 2021-03-30 DIAGNOSIS — Z96652 Presence of left artificial knee joint: Secondary | ICD-10-CM | POA: Diagnosis not present

## 2021-03-30 DIAGNOSIS — Z96653 Presence of artificial knee joint, bilateral: Secondary | ICD-10-CM | POA: Diagnosis not present

## 2021-03-30 DIAGNOSIS — M1711 Unilateral primary osteoarthritis, right knee: Secondary | ICD-10-CM | POA: Diagnosis not present

## 2021-03-30 DIAGNOSIS — M1712 Unilateral primary osteoarthritis, left knee: Secondary | ICD-10-CM | POA: Diagnosis not present

## 2021-04-03 DIAGNOSIS — I129 Hypertensive chronic kidney disease with stage 1 through stage 4 chronic kidney disease, or unspecified chronic kidney disease: Secondary | ICD-10-CM | POA: Diagnosis not present

## 2021-04-03 DIAGNOSIS — I5022 Chronic systolic (congestive) heart failure: Secondary | ICD-10-CM | POA: Diagnosis not present

## 2021-04-03 DIAGNOSIS — N184 Chronic kidney disease, stage 4 (severe): Secondary | ICD-10-CM | POA: Diagnosis not present

## 2021-04-03 DIAGNOSIS — R809 Proteinuria, unspecified: Secondary | ICD-10-CM | POA: Diagnosis not present

## 2021-04-03 DIAGNOSIS — N2581 Secondary hyperparathyroidism of renal origin: Secondary | ICD-10-CM | POA: Diagnosis not present

## 2021-04-03 DIAGNOSIS — I10 Essential (primary) hypertension: Secondary | ICD-10-CM | POA: Diagnosis not present

## 2021-04-07 ENCOUNTER — Other Ambulatory Visit: Payer: Self-pay

## 2021-04-07 ENCOUNTER — Encounter: Payer: PPO | Admitting: Physician Assistant

## 2021-04-07 DIAGNOSIS — L89153 Pressure ulcer of sacral region, stage 3: Secondary | ICD-10-CM | POA: Diagnosis not present

## 2021-04-07 DIAGNOSIS — I5022 Chronic systolic (congestive) heart failure: Secondary | ICD-10-CM | POA: Diagnosis not present

## 2021-04-07 NOTE — Progress Notes (Addendum)
PHARAOH, PIO ([id number]) Visit Report for 04/07/2021 Chief Complaint Document Details Patient Name: [name], [name]. Date of Service: 04/07/2021 10:30 AM Medical Record Number: [id number] Patient Account Number: [id number] Date of Birth/Sex: [birth year] (87 y.o. M) Treating RN: Donnamarie Poag Primary Care Provider: Harrel Lemon Other Clinician: Referring Provider: Harrel Lemon Treating Provider/Extender: Skipper Cliche in Treatment: 5 Information Obtained from: Patient Chief Complaint 08/31/2020; patient is here for review of wounds on his bilateral buttocks surrounding his coccyx Electronic Signature(s) Signed: 04/07/2021 10:40:17 AM By: Worthy Keeler PA-C Entered By: Worthy Keeler on 04/07/2021 10:40:17 Bilotta, [name] ([id number]) -------------------------------------------------------------------------------- HPI Details Patient Name: James Prude T. Date of Service: 04/07/2021 10:30 AM Medical Record Number: [id number] Patient Account Number: [id number] Date of Birth/Sex: [birth year] (87 y.o. M) Treating RN: Donnamarie Poag Primary Care Provider: Harrel Lemon Other Clinician: Referring Provider: Harrel Lemon Treating Provider/Extender: Skipper Cliche in Treatment: 5 History of Present Illness HPI Description: ADMISSION 08/31/2020 This is an 87 year old independent man who lives at home on his own. His problem started in August when he fell and fractured his right femur. He required an ORIF. He was sent to a rehab facility. At some point in this timeframe he developed bilateral pressure ulcers on his buttock. He was seen by dermatology Dr. Nehemiah Massed on 06/27/2020 noting the pressure injuries of his bilateral buttocks at the time treatment order for salicylic acid 3% ointment. Saw his primary doctor in the same timeframe. He is currently using a compounded ointment to the area including iodoquinol 1% / 0.25% hydrocortisone/2% niacinamide. I am not really sure how this  is been progressing. The wounds are stage II on the bilateral buttocks surrounding the coccyx area. The patient has been changing his dressing himself. He is a very active man in fact had another fall in November suffering a periprosthetic hip fracture requiring additional pinning. He says actually after the second surgery he improved and he no longer has as much pain. The patient is here with his daughter. He also has a son that lives nearby. Past medical history includes systolic congestive heart failure, ischemic cardiomyopathy, right femur fracture and subsequent periprosthetic fracture as described, chronic kidney disease stage III, MGUS and abdominal aortic aneurysm 12/29; since the patient was last here he was apparently admitted with congestive heart failure. He was not given any wound care supplies he was discharged on Christmas day he apparently called here to get [name] to some orders and more of the collagen and apparently we did so but none never arrived. His son has been applying topical antibiotics. There is not too much change in the wound area on either side 09/28/2020; 2 small open areas on the mirror-image of the buttock bilaterally just below the coccyx level. This is in the fold of the gluteal cleft. Both surface areas are a lot smaller we have been using silver collagen and foam dressing 10/19/2020; 2 small areas in the mirror image of the buttock bilaterally just below the coccyx level in the gluteal cleft. These are actually totally epithelialized today which is good to see READMISSION 03/01/2021 This is a patient we had earlier this year with 2 small mirror-image wounds on his bilateral buttocks in close proximity to the gluteal cleft around his sacrum. We managed to get these to close over with meticulous pressure relief and collagen. He tells me everything was going well until he was hospitalized from 01/31/2021 through 02/08/2021 with congestive heart failure. He required IV  diuretics. He was discharged  on chronic oxygen. They have been using some collagen that they had leftover from last time but ran out of supplies. Once again the patient has 3 small areas all in close proximity to the lower sacrum/coccyx and the gluteal cleft. 2 on the left and a small area on the right. Although these have clean surfaces. The larger area on the left almost is shaped like an S, not really sure how he would have done this unless he lay on an oxygen cord in the hospital. Past medical history is essentially unchanged from above except that he now wears oxygen 6/29; patient presents for 2-week follow-up. He has been using collagen to the sacral wounds and aggressively offloading the area. He denies signs of infection and has no issues today. 04/07/2021 upon evaluation today patient appears to be doing excellent. In fact he appears to be completely healed which is great news. There is no signs of active infection at all at this time which is also great news. In general I think that he is truly ready for discharge at this point. Electronic Signature(s) Signed: 04/07/2021 6:33:26 PM By: Worthy Keeler PA-C Entered By: Worthy Keeler on 04/07/2021 18:33:26 [name], [name] ([id number]) -------------------------------------------------------------------------------- Physical Exam Details Patient Name: James Prude T. Date of Service: 04/07/2021 10:30 AM Medical Record Number: [id number] Patient Account Number: [id number] Date of Birth/Sex: 11-11-1930 (87 y.o. M) Treating RN: Donnamarie Poag Primary Care Provider: Harrel Lemon Other Clinician: Referring Provider: Harrel Lemon Treating Provider/Extender: Jeri Cos Weeks in Treatment: 5 Constitutional Well-nourished and well-hydrated in no acute distress. Respiratory normal breathing without difficulty. Psychiatric this patient is able to make decisions and demonstrates good insight into disease process. Alert and Oriented x 3.  pleasant and cooperative. Notes Patient's wound bed actually showed signs again of good granulation and epithelization there is no evidence of active infection which is great and overall I am extremely pleased with where things stand today. No fevers, chills, nausea, vomiting, or diarrhea. Electronic Signature(s) Signed: 04/07/2021 6:33:57 PM By: Worthy Keeler PA-C Previous Signature: 04/07/2021 6:33:43 PM Version By: Worthy Keeler PA-C Entered By: Worthy Keeler on 04/07/2021 18:33:57 [name], [name] ([id number]) -------------------------------------------------------------------------------- Physician Orders Details Patient Name: James Prude T. Date of Service: 04/07/2021 10:30 AM Medical Record Number: [id number] Patient Account Number: [id number] Date of Birth/Sex: Nov 20, 1930 (87 y.o. M) Treating RN: Donnamarie Poag Primary Care Provider: Harrel Lemon Other Clinician: Referring Provider: Harrel Lemon Treating Provider/Extender: Skipper Cliche in Treatment: 5 Verbal / Phone Orders: No Diagnosis Coding ICD-10 Coding Code Description L89.152 Pressure ulcer of sacral region, stage 2 Discharge From [facility] o Discharge from [place] [facility] Complete Off-Loading o Turn and reposition every 2 hours - keep pressure off of the wound site-don't sit too long-sleep in the bed is desired Additional Orders / Instructions o Follow Nutritious Diet and Increase Protein Intake o Other: - If moisture is a problem you can use Zinc Oxide (Desitin) or use AandD ointment for protection May use a padded dressing for protection if desired Electronic Signature(s) Signed: 04/07/2021 6:24:05 PM By: Donnamarie Poag Signed: 04/07/2021 6:45:55 PM By: Worthy Keeler PA-C Entered By: Donnamarie Poag on 04/07/2021 11:12:50 [name], [name] ([id number]) -------------------------------------------------------------------------------- Problem List Details Patient Name: James Prude  T. Date of Service: 04/07/2021 10:30 AM Medical Record Number: [id number] Patient Account Number: [id number] Date of Birth/Sex: Jan 25, 1931 (87 y.o. M) Treating RN: Donnamarie Poag Primary Care Provider: Harrel Lemon Other Clinician: Referring Provider: Harrel Lemon Treating Provider/Extender: Joaquim Lai,  Vance Gather in Treatment: 5 Active Problems ICD-10 Encounter Code Description Active Date MDM Diagnosis L89.152 Pressure ulcer of sacral region, stage 2 03/01/2021 No Yes Inactive Problems Resolved Problems Electronic Signature(s) Signed: 04/07/2021 10:40:12 AM By: Worthy Keeler PA-C Entered By: Worthy Keeler on 04/07/2021 10:40:11 [name], [name] ([id number]) -------------------------------------------------------------------------------- Progress Note Details Patient Name: James Prude T. Date of Service: 04/07/2021 10:30 AM Medical Record Number: [id number] Patient Account Number: [id number] Date of Birth/Sex: [birth year] (87 y.o. M) Treating RN: Donnamarie Poag Primary Care Provider: Harrel Lemon Other Clinician: Referring Provider: Harrel Lemon Treating Provider/Extender: Skipper Cliche in Treatment: 5 Subjective Chief Complaint Information obtained from Patient 08/31/2020; patient is here for review of wounds on his bilateral buttocks surrounding his coccyx History of Present Illness (HPI) ADMISSION 08/31/2020 This is an 87 year old independent man who lives at home on his own. His problem started in August when he fell and fractured his right femur. He required an ORIF. He was sent to a rehab facility. At some point in this timeframe he developed bilateral pressure ulcers on his buttock. He was seen by dermatology Dr. Nehemiah Massed on 06/27/2020 noting the pressure injuries of his bilateral buttocks at the time treatment order for salicylic acid 3% ointment. Saw his primary doctor in the same timeframe. He is currently using a compounded ointment to the area including  iodoquinol 1% / 0.25% hydrocortisone/2% niacinamide. I am not really sure how this is been progressing. The wounds are stage II on the bilateral buttocks surrounding the coccyx area. The patient has been changing his dressing himself. He is a very active man in fact had another fall in November suffering a periprosthetic hip fracture requiring additional pinning. He says actually after the second surgery he improved and he no longer has as much pain. The patient is here with his daughter. He also has a son that lives nearby. Past medical history includes systolic congestive heart failure, ischemic cardiomyopathy, right femur fracture and subsequent periprosthetic fracture as described, chronic kidney disease stage III, MGUS and abdominal aortic aneurysm 12/29; since the patient was last here he was apparently admitted with congestive heart failure. He was not given any wound care supplies he was discharged on Christmas day he apparently called here to get [name] to some orders and more of the collagen and apparently we did so but none never arrived. His son has been applying topical antibiotics. There is not too much change in the wound area on either side 09/28/2020; 2 small open areas on the mirror-image of the buttock bilaterally just below the coccyx level. This is in the fold of the gluteal cleft. Both surface areas are a lot smaller we have been using silver collagen and foam dressing 10/19/2020; 2 small areas in the mirror image of the buttock bilaterally just below the coccyx level in the gluteal cleft. These are actually totally epithelialized today which is good to see READMISSION 03/01/2021 This is a patient we had earlier this year with 2 small mirror-image wounds on his bilateral buttocks in close proximity to the gluteal cleft around his sacrum. We managed to get these to close over with meticulous pressure relief and collagen. He tells me everything was going well until he was hospitalized  from 01/31/2021 through 02/08/2021 with congestive heart failure. He required IV diuretics. He was discharged on chronic oxygen. They have been using some collagen that they had leftover from last time but ran out of supplies. Once again the patient has 3 small areas all  in close proximity to the lower sacrum/coccyx and the gluteal cleft. 2 on the left and a small area on the right. Although these have clean surfaces. The larger area on the left almost is shaped like an S, not really sure how he would have done this unless he lay on an oxygen cord in the hospital. Past medical history is essentially unchanged from above except that he now wears oxygen 6/29; patient presents for 2-week follow-up. He has been using collagen to the sacral wounds and aggressively offloading the area. He denies signs of infection and has no issues today. 04/07/2021 upon evaluation today patient appears to be doing excellent. In fact he appears to be completely healed which is great news. There is no signs of active infection at all at this time which is also great news. In general I think that he is truly ready for discharge at this point. Objective Constitutional Well-nourished and well-hydrated in no acute distress. Vitals Time Taken: 10:41 AM, Height: 71 in, Weight: 164 lbs, BMI: 22.9, Temperature: 97.5 F, Pulse: 71 bpm, Respiratory Rate: 18 breaths/min, Blood Pressure: 114/73 mmHg. [name], James T. ([id number]) Respiratory normal breathing without difficulty. Psychiatric this patient is able to make decisions and demonstrates good insight into disease process. Alert and Oriented x 3. pleasant and cooperative. General Notes: Patient's wound bed actually showed signs again of good granulation and epithelization there is no evidence of active infection which is great and overall I am extremely pleased with where things stand today. No fevers, chills, nausea, vomiting, or diarrhea. Integumentary (Hair, Skin) Wound  #3 status is Healed - Epithelialized. Original cause of wound was Gradually Appeared. The date acquired was: 02/12/2021. The wound has been in treatment 5 weeks. The wound is located on the Proximal Sacrum. The wound measures 0cm length x 0cm width x 0cm depth; 0cm^2 area and 0cm^3 volume. There is a none present amount of drainage noted. There is no granulation within the wound bed. There is no necrotic tissue within the wound bed. Wound #4 status is Healed - Epithelialized. Original cause of wound was Gradually Appeared. The date acquired was: 02/12/2021. The wound has been in treatment 5 weeks. The wound is located on the Distal Sacrum. The wound measures 0cm length x 0cm width x 0cm depth; 0cm^2 area and 0cm^3 volume. There is a none present amount of drainage noted. There is no granulation within the wound bed. There is no necrotic tissue within the wound bed. Assessment Active Problems ICD-10 Pressure ulcer of sacral region, stage 2 Plan Discharge From [facility] Services: Discharge from [facility] Complete Off-Loading: Turn and reposition every 2 hours - keep pressure off of the wound site-don't sit too long-sleep in the bed is desired Additional Orders / Instructions: Follow Nutritious Diet and Increase Protein Intake Other: - If moisture is a problem you can use Zinc Oxide (Desitin) or use AandD ointment for protection May use a padded dressing for protection if desired 1. I am going to recommend currently that we go ahead and discontinue wound care services as the patient appears to be completely healed. I do think that he can use AandE ointment or Desitin depending on whether the he needs moisture to the gluteal/sacral region such as what AandE ointment would provide or else needs to prevent moisture such as what he would get out of the use of Desitin. Either way I think that that is really all that he needs at this point I do not think that having  an actual dressing on  this is necessary. 2. With regard to the overall appearance of the area I think it is very well-healed and seems to be doing excellent at this point. We will see the patient back for follow-up visit as needed. Electronic Signature(s) Signed: 04/07/2021 6:34:37 PM By: Worthy Keeler PA-C Entered By: Worthy Keeler on 04/07/2021 18:34:36 [name], [name] ([id number]) -------------------------------------------------------------------------------- SuperBill Details Patient Name: James Prude T. Date of Service: 04/07/2021 Medical Record Number: [id number] Patient Account Number: [id number] Date of Birth/Sex: 03-09-1931 (87 y.o. M) Treating RN: Donnamarie Poag Primary Care Provider: Harrel Lemon Other Clinician: Referring Provider: Harrel Lemon Treating Provider/Extender: Jeri Cos Weeks in Treatment: 5 Diagnosis Coding ICD-10 Codes Code Description [phone] Pressure ulcer of sacral region, stage 2 Facility Procedures CPT4 Code: [id number] Description: [phone] - WOUND CARE VISIT-LEV 2 EST PT Modifier: Quantity: 1 Physician Procedures CPT4 Code: [id number] Description: [id number] - WC PHYS LEVEL 3 - EST PT Modifier: Quantity: 1 CPT4 Code: Description: ICD-10 Diagnosis Description L89.152 Pressure ulcer of sacral region, stage 2 Modifier: Quantity: Electronic Signature(s) Signed: 04/07/2021 6:34:59 PM By: Worthy Keeler PA-C Previous Signature: 04/07/2021 6:24:05 PM Version By: Donnamarie Poag Entered By: Worthy Keeler on 04/07/2021 18:34:59

## 2021-04-07 NOTE — Progress Notes (Addendum)
J[place], RAVENSCROFT ([id number]) Visit Report for 04/07/2021 Arrival Information Details Patient Name: [name], [name]. Date of Service: 04/07/2021 10:30 AM Medical Record Number: [id number] Patient Account Number: [id number] Date of Birth/Sex: 1931/02/02 (87 y.o. M) Treating RN: Donnamarie Poag Primary Care Robbert Langlinais: Harrel Lemon Other Clinician: Referring Valgene Deloatch: Harrel Lemon Treating Cristan Scherzer/Extender: Skipper Cliche in Treatment: 5 Visit Information History Since Last Visit Added or deleted any medications: No Patient Arrived: [name] Had a fall or experienced change in No Arrival Time: 10:40 activities of daily living that may affect Accompanied By: daughter in law risk of falls: Transfer Assistance: None Hospitalized since last visit: No Patient Identification Verified: Yes Has Dressing in Place as Prescribed: Yes Secondary Verification Process Completed: Yes Pain Present Now: No Patient Requires Transmission-Based Precautions: No Patient Has Alerts: No Electronic Signature(s) Signed: 04/07/2021 6:24:05 PM By: Donnamarie Poag Entered By: Donnamarie Poag on 04/07/2021 10:43:34 Cushman, Durenda Holt ([id number]) -------------------------------------------------------------------------------- Clinic Level of Care Assessment Details Patient Name: James Prude T. Date of Service: 04/07/2021 10:30 AM Medical Record Number: [id number] Patient Account Number: [id number] Date of Birth/Sex: 02/28/1931 (87 y.o. M) Treating RN: Donnamarie Poag Primary Care Omario Ander: Harrel Lemon Other Clinician: Referring Taquanna Borras: Harrel Lemon Treating Yuktha Kerchner/Extender: Skipper Cliche in Treatment: 5 Clinic Level of Care Assessment Items TOOL 4 Quantity Score []  - Use when only an EandM is performed on FOLLOW-UP visit 0 ASSESSMENTS - Nursing Assessment / Reassessment []  - Reassessment of Co-morbidities (includes updates in patient status) 0 []  - 0 Reassessment of Adherence to Treatment  Plan ASSESSMENTS - Wound and Skin Assessment / Reassessment []  - Simple Wound Assessment / Reassessment - one wound 0 X- 2 5 Complex Wound Assessment / Reassessment - multiple wounds []  - 0 Dermatologic / Skin Assessment (not related to wound area) ASSESSMENTS - Focused Assessment []  - Circumferential Edema Measurements - multi extremities 0 []  - 0 Nutritional Assessment / Counseling / Intervention []  - 0 Lower Extremity Assessment (monofilament, tuning fork, pulses) []  - 0 Peripheral Arterial Disease Assessment (using hand held doppler) ASSESSMENTS - Ostomy and/or Continence Assessment and Care []  - Incontinence Assessment and Management 0 []  - 0 Ostomy Care Assessment and Management (repouching, etc.) PROCESS - Coordination of Care X - Simple Patient / Family Education for ongoing care 1 15 []  - 0 Complex (extensive) Patient / Family Education for ongoing care []  - 0 Staff obtains Programmer, systems, Records, Test Results / Process Orders []  - 0 Staff telephones HHA, Nursing Homes / Clarify orders / etc []  - 0 Routine Transfer to another Facility (non-emergent condition) []  - 0 Routine Hospital Admission (non-emergent condition) []  - 0 New Admissions / Biomedical engineer / Ordering NPWT, Apligraf, etc. []  - 0 Emergency Hospital Admission (emergent condition) X- 1 10 Simple Discharge Coordination []  - 0 Complex (extensive) Discharge Coordination PROCESS - Special Needs []  - Pediatric / Minor Patient Management 0 []  - 0 Isolation Patient Management []  - 0 Hearing / Language / Visual special needs []  - 0 Assessment of Community assistance (transportation, D/C planning, etc.) []  - 0 Additional assistance / Altered mentation []  - 0 Support Surface(s) Assessment (bed, cushion, seat, etc.) INTERVENTIONS - Wound Cleansing / Measurement Olinde, Khyre T. ([id number]) []  - 0 Simple Wound Cleansing - one wound X- 2 5 Complex Wound Cleansing - multiple wounds []  - 0 Wound  Imaging (photographs - any number of wounds) []  - 0 Wound Tracing (instead of photographs) []  - 0 Simple Wound Measurement - one wound []  - 0 Complex Wound Measurement -  multiple wounds INTERVENTIONS - Wound Dressings []  - Small Wound Dressing one or multiple wounds 0 []  - 0 Medium Wound Dressing one or multiple wounds []  - 0 Large Wound Dressing one or multiple wounds []  - 0 Application of Medications - topical []  - 0 Application of Medications - injection INTERVENTIONS - Miscellaneous []  - External ear exam 0 []  - 0 Specimen Collection (cultures, biopsies, blood, body fluids, etc.) []  - 0 Specimen(s) / Culture(s) sent or taken to Lab for analysis []  - 0 Patient Transfer (multiple staff / Civil Service fast streamer / Similar devices) []  - 0 Simple Staple / Suture removal (25 or less) []  - 0 Complex Staple / Suture removal (26 or more) []  - 0 Hypo / Hyperglycemic Management (close monitor of Blood Glucose) []  - 0 Ankle / Brachial Index (ABI) - do not check if billed separately X- 1 5 Vital Signs Has the patient been seen at the hospital within the last three years: Yes Total Score: 50 Level Of Care: New/Established - Level 2 Electronic Signature(s) Signed: 04/07/2021 6:24:05 PM By: Donnamarie Poag Entered By: Donnamarie Poag on 04/07/2021 11:13:37 [name] ([id number]) -------------------------------------------------------------------------------- Encounter Discharge Information Details Patient Name: James Prude T. Date of Service: 04/07/2021 10:30 AM Medical Record Number: [id number] Patient Account Number: [id number] Date of Birth/Sex: Jan 19, 1931 (87 y.o. M) Treating RN: Donnamarie Poag Primary Care Panda Crossin: Harrel Lemon Other Clinician: Referring Arshi Duarte: Harrel Lemon Treating Deniel Mcquiston/Extender: Skipper Cliche in Treatment: 5 Encounter Discharge Information Items Discharge Condition: Stable Ambulatory Status: Walker Discharge Destination: Home Transportation: Private  Auto Accompanied By: daughter in law Schedule Follow-up Appointment: Yes Clinical Summary of Care: Electronic Signature(s) Signed: 04/07/2021 6:24:05 PM By: Donnamarie Poag Entered By: Donnamarie Poag on 04/07/2021 11:16:40 Cassarino, Durenda Holt ([id number]) -------------------------------------------------------------------------------- Lower Extremity Assessment Details Patient Name: James Prude T. Date of Service: 04/07/2021 10:30 AM Medical Record Number: [id number] Patient Account Number: [id number] Date of Birth/Sex: 02/03/1931 (87 y.o. M) Treating RN: Donnamarie Poag Primary Care Razi Hickle: Harrel Lemon Other Clinician: Referring Cagney Steenson: Harrel Lemon Treating Iyannah Blake/Extender: Jeri Cos Weeks in Treatment: 5 Electronic Signature(s) Signed: 04/07/2021 6:24:05 PM By: Donnamarie Poag Entered By: Donnamarie Poag on 04/07/2021 10:49:19 Katayama, Durenda Holt ([id number]) -------------------------------------------------------------------------------- Multi Wound Chart Details Patient Name: James Prude T. Date of Service: 04/07/2021 10:30 AM Medical Record Number: [id number] Patient Account Number: [id number] Date of Birth/Sex: 1931-03-24 (87 y.o. M) Treating RN: Donnamarie Poag Primary Care Shatonya Passon: Harrel Lemon Other Clinician: Referring Danean Marner: Harrel Lemon Treating Zekiah Coen/Extender: Skipper Cliche in Treatment: 5 Vital Signs Height(in): 71 Pulse(bpm): 77 Weight(lbs): 164 Blood Pressure(mmHg): 114/73 Body Mass Index(BMI): 23 Temperature(F): 97.5 Respiratory Rate(breaths/min): 18 Photos: [3:No Photos] [N/A:N/A] Wound Location: Proximal Sacrum Distal Sacrum N/A Wounding Event: Gradually Appeared Gradually Appeared N/A Primary Etiology: Pressure Ulcer Pressure Ulcer N/A Comorbid History: Coronary Artery Disease, Coronary Artery Disease, N/A Hypertension, History of pressure Hypertension, History of pressure wounds wounds Date Acquired: 02/12/2021 02/12/2021 N/A Weeks of Treatment: 5  5 N/A Wound Status: Healed - Epithelialized Open N/A Measurements L x W x D (cm) 0x0x0 0.1x0.1x0.1 N/A Area (cm) : 0 0.008 N/A Volume (cm) : 0 0.001 N/A % Reduction in Area: 100.00% 99.80% N/A % Reduction in Volume: 100.00% 99.80% N/A Classification: Category/Stage III Category/Stage III N/A Exudate Amount: None Present None Present N/A Granulation Amount: None Present (0%) None Present (0%) N/A Necrotic Amount: None Present (0%) None Present (0%) N/A Exposed Structures: Fascia: No Fascia: No N/A Fat Layer (Subcutaneous Tissue): Fat Layer (Subcutaneous Tissue): No No Tendon: No Tendon: No Muscle:  No Muscle: No Joint: No Joint: No Bone: No Bone: No Epithelialization: Large (67-100%) Large (67-100%) N/A Treatment Notes Electronic Signature(s) Signed: 04/07/2021 6:24:05 PM By: Donnamarie Poag Entered By: Donnamarie Poag on 04/07/2021 10:49:51 Alatorre, Durenda Holt ([id number]) -------------------------------------------------------------------------------- [place] Details Patient Name: James Prude T. Date of Service: 04/07/2021 10:30 AM Medical Record Number: [id number] Patient Account Number: [id number] Date of Birth/Sex: 10-Jun-1931 (87 y.o. M) Treating RN: Donnamarie Poag Primary Care Demarrius Guerrero: Harrel Lemon Other Clinician: Referring Yuritzy Zehring: Harrel Lemon Treating Chidubem Chaires/Extender: Jeri Cos Weeks in Treatment: 5 Active Inactive Electronic Signature(s) Signed: 04/07/2021 6:24:05 PM By: Donnamarie Poag Entered By: Donnamarie Poag on 04/07/2021 10:49:42 Bogdon, Durenda Holt ([id number]) -------------------------------------------------------------------------------- Pain Assessment Details Patient Name: James Prude T. Date of Service: 04/07/2021 10:30 AM Medical Record Number: [id number] Patient Account Number: [id number] Date of Birth/Sex: 1930-10-30 (87 y.o. M) Treating RN: Donnamarie Poag Primary Care Valerian Jewel: Harrel Lemon Other Clinician: Referring Xayvier Vallez:  Harrel Lemon Treating Ellanore Vanhook/Extender: Skipper Cliche in Treatment: 5 Active Problems Location of Pain Severity and Description of Pain Patient Has Paino No Site Locations Rate the pain. Current Pain Level: 0 Pain Management and Medication Current Pain Management: Electronic Signature(s) Signed: 04/07/2021 6:24:05 PM By: Donnamarie Poag Entered By: Donnamarie Poag on 04/07/2021 10:44:00 Even, Durenda Holt ([id number]) -------------------------------------------------------------------------------- Patient/Caregiver Education Details Patient Name: James Prude T. Date of Service: 04/07/2021 10:30 AM Medical Record Number: [id number] Patient Account Number: [id number] Date of Birth/Gender: 13-Feb-1931 (87 y.o. M) Treating RN: Donnamarie Poag Primary Care Physician: Harrel Lemon Other Clinician: Referring Physician: Harrel Lemon Treating Physician/Extender: Skipper Cliche in Treatment: 5 Education Assessment Education Provided To: Patient and Caregiver Education Topics Provided Basic Hygiene: Offloading: Electronic Signature(s) Signed: 04/07/2021 6:24:05 PM By: Donnamarie Poag Entered By: Donnamarie Poag on 04/07/2021 10:50:07 Wisinski, Durenda Holt ([id number]) -------------------------------------------------------------------------------- Wound Assessment Details Patient Name: James Prude T. Date of Service: 04/07/2021 10:30 AM Medical Record Number: [id number] Patient Account Number: [id number] Date of Birth/Sex: [birth year] (87 y.o. M) Treating RN: Donnamarie Poag Primary Care Joshau Code: Harrel Lemon Other Clinician: Referring Gabryelle Whitmoyer: Harrel Lemon Treating Reneisha Stilley/Extender: Jeri Cos Weeks in Treatment: 5 Wound Status Wound Number: 3 Primary Pressure Ulcer Etiology: Wound Location: Proximal Sacrum Wound Status: Healed - Epithelialized Wounding Event: Gradually Appeared Comorbid Coronary Artery Disease, Hypertension, History of Date Acquired: 02/12/2021 History: pressure  wounds Weeks Of Treatment: 5 Clustered Wound: No Wound Measurements Length: (cm) 0 Width: (cm) 0 Depth: (cm) 0 Area: (cm) 0 Volume: (cm) 0 % Reduction in Area: 100% % Reduction in Volume: 100% Epithelialization: Large (67-100%) Wound Description Classification: Category/Stage III Exudate Amount: None Present Foul Odor After Cleansing: No Slough/Fibrino No Wound Bed Granulation Amount: None Present (0%) Exposed Structure Necrotic Amount: None Present (0%) Fascia Exposed: No Fat Layer (Subcutaneous Tissue) Exposed: No Tendon Exposed: No Muscle Exposed: No Joint Exposed: No Bone Exposed: No Electronic Signature(s) Signed: 04/07/2021 6:24:05 PM By: Donnamarie Poag Entered ByDonnamarie Poag on 04/07/2021 10:48:08 Amsden, Durenda Holt ([id number]) -------------------------------------------------------------------------------- Wound Assessment Details Patient Name: James Prude T. Date of Service: 04/07/2021 10:30 AM Medical Record Number: [id number] Patient Account Number: [id number] Date of Birth/Sex: 06/06/1931 (87 y.o. M) Treating RN: Donnamarie Poag Primary Care Alima Naser: Harrel Lemon Other Clinician: Referring Rockne Dearinger: Harrel Lemon Treating Delancey Moraes/Extender: Jeri Cos Weeks in Treatment: 5 Wound Status Wound Number: 4 Primary Pressure Ulcer Etiology: Wound Location: Distal Sacrum Wound Status: Healed - Epithelialized Wounding Event: Gradually Appeared Comorbid Coronary Artery Disease, Hypertension, History of Date Acquired: 02/12/2021 History: pressure wounds Weeks Of Treatment: 5 Clustered Wound: No Photos Wound  Measurements Length: (cm) 0 Width: (cm) 0 Depth: (cm) 0 Area: (cm) 0 Volume: (cm) 0 % Reduction in Area: 100% % Reduction in Volume: 100% Epithelialization: Large (67-100%) Wound Description Classification: Category/Stage III Foul Exudate Amount: None Present Slou Odor After Cleansing: No gh/Fibrino No Wound Bed Granulation Amount: None Present  (0%) Exposed Structure Necrotic Amount: None Present (0%) Fascia Exposed: No Fat Layer (Subcutaneous Tissue) Exposed: No Tendon Exposed: No Muscle Exposed: No Joint Exposed: No Bone Exposed: No Treatment Notes Wound #4 (Sacrum) Wound Laterality: Distal Cleanser Peri-Wound Care Topical Primary Dressing GARRIE, WOODIN ([id number]) Secondary Dressing Secured With Compression Wrap Compression Stockings Add-Ons Electronic Signature(s) Signed: 04/07/2021 6:24:05 PM By: Donnamarie Poag Entered By: Donnamarie Poag on 04/07/2021 11:10:59 Dotts, Durenda Holt ([id number]) -------------------------------------------------------------------------------- [place] Details Patient Name: James Prude T. Date of Service: 04/07/2021 10:30 AM Medical Record Number: [id number] Patient Account Number: [id number] Date of Birth/Sex: 1931-09-06 (87 y.o. M) Treating RN: Donnamarie Poag Primary Care Breeanne Oblinger: Harrel Lemon Other Clinician: Referring Hannelore Bova: Harrel Lemon Treating Guerline Happ/Extender: Skipper Cliche in Treatment: 5 Vital Signs Time Taken: 10:41 Temperature (F): 97.5 Height (in): 71 Pulse (bpm): 71 Weight (lbs): 164 Respiratory Rate (breaths/min): 18 Body Mass Index (BMI): 22.9 Blood Pressure (mmHg): 114/73 Reference Range: 80 - 120 mg / dl Electronic Signature(s) Signed: 04/07/2021 6:24:05 PM By: Donnamarie Poag Entered ByDonnamarie Poag on 04/07/2021 10:43:53

## 2021-04-11 DIAGNOSIS — I5023 Acute on chronic systolic (congestive) heart failure: Secondary | ICD-10-CM | POA: Diagnosis not present

## 2021-04-11 DIAGNOSIS — I509 Heart failure, unspecified: Secondary | ICD-10-CM | POA: Diagnosis not present

## 2021-05-01 DIAGNOSIS — J90 Pleural effusion, not elsewhere classified: Secondary | ICD-10-CM | POA: Diagnosis not present

## 2021-05-01 DIAGNOSIS — Z9981 Dependence on supplemental oxygen: Secondary | ICD-10-CM | POA: Diagnosis not present

## 2021-05-01 DIAGNOSIS — R0609 Other forms of dyspnea: Secondary | ICD-10-CM | POA: Diagnosis not present

## 2021-05-01 DIAGNOSIS — I5022 Chronic systolic (congestive) heart failure: Secondary | ICD-10-CM | POA: Diagnosis not present

## 2021-05-01 DIAGNOSIS — Z87448 Personal history of other diseases of urinary system: Secondary | ICD-10-CM | POA: Diagnosis not present

## 2021-05-01 DIAGNOSIS — R918 Other nonspecific abnormal finding of lung field: Secondary | ICD-10-CM | POA: Diagnosis not present

## 2021-05-02 DIAGNOSIS — F419 Anxiety disorder, unspecified: Secondary | ICD-10-CM | POA: Diagnosis not present

## 2021-05-11 DIAGNOSIS — I509 Heart failure, unspecified: Secondary | ICD-10-CM | POA: Diagnosis not present

## 2021-05-11 DIAGNOSIS — R918 Other nonspecific abnormal finding of lung field: Secondary | ICD-10-CM | POA: Diagnosis not present

## 2021-05-11 DIAGNOSIS — I517 Cardiomegaly: Secondary | ICD-10-CM | POA: Diagnosis not present

## 2021-05-11 DIAGNOSIS — R0600 Dyspnea, unspecified: Secondary | ICD-10-CM | POA: Diagnosis not present

## 2021-05-11 DIAGNOSIS — Z125 Encounter for screening for malignant neoplasm of prostate: Secondary | ICD-10-CM | POA: Diagnosis not present

## 2021-05-11 DIAGNOSIS — R0609 Other forms of dyspnea: Secondary | ICD-10-CM | POA: Diagnosis not present

## 2021-05-11 DIAGNOSIS — J90 Pleural effusion, not elsewhere classified: Secondary | ICD-10-CM | POA: Diagnosis not present

## 2021-05-11 DIAGNOSIS — N184 Chronic kidney disease, stage 4 (severe): Secondary | ICD-10-CM | POA: Diagnosis not present

## 2021-05-12 ENCOUNTER — Other Ambulatory Visit (HOSPITAL_COMMUNITY): Payer: Self-pay | Admitting: Specialist

## 2021-05-12 ENCOUNTER — Other Ambulatory Visit: Payer: Self-pay | Admitting: Specialist

## 2021-05-12 DIAGNOSIS — I509 Heart failure, unspecified: Secondary | ICD-10-CM | POA: Diagnosis not present

## 2021-05-12 DIAGNOSIS — R918 Other nonspecific abnormal finding of lung field: Secondary | ICD-10-CM

## 2021-05-12 DIAGNOSIS — I5023 Acute on chronic systolic (congestive) heart failure: Secondary | ICD-10-CM | POA: Diagnosis not present

## 2021-05-15 DIAGNOSIS — I2510 Atherosclerotic heart disease of native coronary artery without angina pectoris: Secondary | ICD-10-CM | POA: Diagnosis not present

## 2021-05-15 DIAGNOSIS — F419 Anxiety disorder, unspecified: Secondary | ICD-10-CM | POA: Diagnosis not present

## 2021-05-15 DIAGNOSIS — I10 Essential (primary) hypertension: Secondary | ICD-10-CM | POA: Diagnosis not present

## 2021-05-15 DIAGNOSIS — Z23 Encounter for immunization: Secondary | ICD-10-CM | POA: Diagnosis not present

## 2021-05-15 DIAGNOSIS — J9611 Chronic respiratory failure with hypoxia: Secondary | ICD-10-CM | POA: Diagnosis not present

## 2021-05-15 DIAGNOSIS — I5023 Acute on chronic systolic (congestive) heart failure: Secondary | ICD-10-CM | POA: Diagnosis not present

## 2021-05-15 DIAGNOSIS — Z0001 Encounter for general adult medical examination with abnormal findings: Secondary | ICD-10-CM | POA: Diagnosis not present

## 2021-05-15 DIAGNOSIS — I350 Nonrheumatic aortic (valve) stenosis: Secondary | ICD-10-CM | POA: Diagnosis not present

## 2021-05-15 DIAGNOSIS — E782 Mixed hyperlipidemia: Secondary | ICD-10-CM | POA: Diagnosis not present

## 2021-05-15 DIAGNOSIS — N184 Chronic kidney disease, stage 4 (severe): Secondary | ICD-10-CM | POA: Diagnosis not present

## 2021-05-18 ENCOUNTER — Ambulatory Visit: Payer: PPO | Admitting: Family

## 2021-05-19 ENCOUNTER — Ambulatory Visit (HOSPITAL_BASED_OUTPATIENT_CLINIC_OR_DEPARTMENT_OTHER): Payer: PPO | Admitting: Family

## 2021-05-19 ENCOUNTER — Ambulatory Visit
Admission: RE | Admit: 2021-05-19 | Discharge: 2021-05-19 | Disposition: A | Discharge status: Home / Self Care | Payer: PPO | Source: Ambulatory Visit | Attending: Specialist | Admitting: Specialist

## 2021-05-19 ENCOUNTER — Encounter: Payer: Self-pay | Admitting: Family

## 2021-05-19 ENCOUNTER — Other Ambulatory Visit: Payer: Self-pay

## 2021-05-19 VITALS — BP 130/91 | HR 63 | Resp 16 | Ht 71.0 in | Wt 169.0 lb

## 2021-05-19 DIAGNOSIS — J189 Pneumonia, unspecified organism: Secondary | ICD-10-CM | POA: Diagnosis not present

## 2021-05-19 DIAGNOSIS — I890 Lymphedema, not elsewhere classified: Secondary | ICD-10-CM | POA: Insufficient documentation

## 2021-05-19 DIAGNOSIS — J90 Pleural effusion, not elsewhere classified: Secondary | ICD-10-CM | POA: Diagnosis not present

## 2021-05-19 DIAGNOSIS — R918 Other nonspecific abnormal finding of lung field: Secondary | ICD-10-CM | POA: Diagnosis not present

## 2021-05-19 DIAGNOSIS — I5022 Chronic systolic (congestive) heart failure: Secondary | ICD-10-CM | POA: Insufficient documentation

## 2021-05-19 DIAGNOSIS — I10 Essential (primary) hypertension: Secondary | ICD-10-CM | POA: Insufficient documentation

## 2021-05-19 DIAGNOSIS — I700 Atherosclerosis of aorta: Secondary | ICD-10-CM | POA: Diagnosis not present

## 2021-05-19 DIAGNOSIS — R0602 Shortness of breath: Secondary | ICD-10-CM | POA: Diagnosis not present

## 2021-05-19 NOTE — Progress Notes (Signed)
Patient ID: [name], male    DOB: Oct 01, 1930, 86 y.o.   MRN: [id number]   [name] is a 87 y/o male with a history of CAD, hyperlipidemia, HTN, CKD, aneami, aortic aneurysm, GERD, MGUS, previous tobacco use and chronic heart failure.   Echo report from 01/31/21 reviewed and showed an EF of <20% along with mild/moderate James and mild AS.   Was in the ED 03/09/21 due to constipation where he was treated and released.   He presents today for a follow-up visit with a chief complaint of moderate fatigue upon minimal exertion. He describes this as chronic in nature having been present for several years. He has associated shortness of breath & pedal edema along with this. He denies any difficulty sleeping, dizziness, cough, chest pain, palpitations, abdominal distention or weight gain.   Has been wearing compression socks daily and says that the edema persists. Was told that in pulmonary rehab, he would need to walk for 6 minutes and he says that he wouldn't be able to do that. Would like to work on the machines to build up his muscles.   Past Medical History:  Diagnosis Date   Anemia    Aortic aneurysm (HCC)    Aortic aneurysm (HCC)    Arthritis    CHF (congestive heart failure) (HCC)    CKD (chronic kidney disease)    ALSO LEFT RENAL MASS   Coronary artery disease    Diverticulosis    WITH RUPTURE   GERD (gastroesophageal reflux disease)    History of hiatal hernia    Hypercholesteremia    Hypertension    MGUS (monoclonal gammopathy of unknown significance)    MGUS (monoclonal gammopathy of unknown significance)    Past Surgical History:  Procedure Laterality Date   AORTIC VALVE REPLACEMENT     bipass     CARDIAC VALVE REPLACEMENT     pig valve   CHOLECYSTECTOMY     COLON SURGERY     CORONARY ANGIOPLASTY     CORONARY ARTERY BYPASS GRAFT     ESOPHAGOGASTRODUODENOSCOPY (EGD) WITH PROPOFOL N/A 02/07/2015   Procedure: ESOPHAGOGASTRODUODENOSCOPY (EGD) WITH PROPOFOL;  Surgeon:  Hulen Luster, MD;  Location: [facility] END[facility]COPY;  Service: Gastroenterology;  Laterality: N/A;   EYE SURGERY     HERNIA REPAIR     INTRAMEDULLARY (IM) NAIL INTERTROCHANTERIC Right 05/12/2020   Procedure: INTRAMEDULLARY (IM) NAIL INTERTROCHANTRIC;  Surgeon: Lovell Sheehan, MD;  Location: [facility] ORS;  Service: Orthopedics;  Laterality: Right;   JOINT REPLACEMENT     left knee x4   KNEE ARTHROPLASTY Right 04/02/2016   Procedure: COMPUTER ASSISTED TOTAL KNEE ARTHROPLASTY;  Surgeon: Dereck Leep, MD;  Location: [facility] ORS;  Service: Orthopedics;  Laterality: Right;   ORIF FEMUR FRACTURE Right 08/08/2020   Procedure: OPEN REDUCTION INTERNAL FIXATION (ORIF) DISTAL FEMUR FRACTURE;  Surgeon: Lovell Sheehan, MD;  Location: [facility] ORS;  Service: Orthopedics;  Laterality: Right;   Family History  Problem Relation Age of Onset   Bladder Cancer Neg Hx    Prostate cancer Neg Hx    Kidney cancer Neg Hx    Social History   Tobacco Use   Smoking status: Former   Smokeless tobacco: Never  Substance Use Topics   Alcohol use: No   Allergies  Allergen Reactions   Morphine Anaphylaxis   Oxycodone Other (See Comments)    Hypotension and bradycardia   Carvedilol     Dizziness and syncope    Iodinated Diagnostic Agents Rash  Other reaction(s): Asthenia (finding), Other (qualifier value) paralyzed legs Other reaction(s): RASH    Lisinopril Cough and Other (See Comments)   Prior to Admission medications   Medication Sig Start Date End Date Taking? Authorizing Provider  albuterol (VENTOLIN HFA) 108 (90 Base) MCG/ACT inhaler Inhale into the lungs. 01/16/21 01/16/22 Yes [provider]  ALPRAZolam Duanne Moron) 0.25 MG tablet Take 0.25 mg by mouth 2 (two) times daily as needed. 08/26/20  Yes [provider]  amiodarone (PACERONE) 200 MG tablet Take 1 tablet (200 mg total) by mouth 2 (two) times daily. 02/08/21  Yes Wouk, Ailene Rud, MD  aspirin EC 81 MG tablet Take 1 tablet (81 mg total) by mouth 2  (two) times daily. Swallow whole. 08/10/20  Yes Donne Hazel, MD  fluticasone [facility]) 50 MCG/ACT nasal spray USE 2 SPRAYS INTO BOTH NOSTRILS DAILY 10/11/20  Yes [provider]  losartan (COZAAR) 25 MG tablet Take 1 tablet (25 mg total) by mouth daily. 02/08/21  Yes Wouk, Ailene Rud, MD  Melatonin 3 MG TABS Take 1.5 mg by mouth at bedtime as needed (sleep).   Yes [provider]  potassium chloride SA (KLOR-CON) 20 MEQ tablet Take 2 tablets (40 mEq total) by mouth daily. 02/08/21  Yes Wouk, Ailene Rud, MD  pravastatin (PRAVACHOL) 20 MG tablet Take 20 mg by mouth every evening.   Yes [provider]  torsemide (DEMADEX) 20 MG tablet Take 2 tablets (40 mg total) by mouth daily. 09/10/20  Yes Lorella Nimrod, MD  traMADol (ULTRAM) 50 MG tablet Take 50 mg by mouth every 4 (four) hours as needed. 08/16/20  Yes [provider]   Review of Systems  Constitutional:  Positive for fatigue (tire easily). Negative for appetite change.  HENT:  Positive for congestion. Negative for postnasal drip and sore throat.   Eyes: Negative.   Respiratory:  Positive for shortness of breath. Negative for cough and chest tightness.   Cardiovascular:  Positive for leg swelling. Negative for chest pain and palpitations.  Gastrointestinal:  Negative for abdominal distention and abdominal pain.  Endocrine: Negative.   Genitourinary: Negative.   Musculoskeletal:  Positive for arthralgias (left knee).  Skin: Negative.   Allergic/Immunologic: Negative.   Neurological:  Negative for dizziness and light-headedness.  Hematological:  Negative for adenopathy. Bruises/bleeds easily.  Psychiatric/Behavioral:  Negative for dysphoric mood and sleep disturbance (wearing oxygen @2L  at bedtime and PRN during the day). The patient is not nervous/anxious.    Vitals:   05/19/21 1304  BP: (!) 130/91  Pulse: 63  Resp: 16  SpO2: 100%  Weight: 169 lb (76.7 kg)  Height: 5\' 11"  (1.803 m)   Wt  Readings from Last 3 Encounters:  05/19/21 169 lb (76.7 kg)  03/29/21 161 lb (73 kg)  02/08/21 162 lb 0.6 oz (73.5 kg)   Lab Results  Component Value Date   CREATININE 2.71 (H) 03/09/2021   CREATININE 2.46 (H) 03/01/2021   CREATININE 3.72 (H) 02/22/2021   Physical Exam Vitals and nursing note reviewed. Exam conducted with a chaperone present (daughter-in-law).  Constitutional:      Appearance: Normal appearance.  HENT:     Head: Normocephalic and atraumatic.  Cardiovascular:     Rate and Rhythm: Normal rate and regular rhythm.  Pulmonary:     Effort: Pulmonary effort is normal. No respiratory distress.     Breath sounds: No wheezing or rales.  Abdominal:     General: There is no distension.     Palpations: Abdomen  is soft.     Tenderness: There is no abdominal tenderness.  Musculoskeletal:        General: No tenderness.     Cervical back: Normal range of motion and neck supple.     Right lower leg: Edema (1+ pitting) present.     Left lower leg: Edema (1+ pitting) present.  Skin:    General: Skin is warm and dry.  Neurological:     General: No focal deficit present.     Mental Status: He is alert and oriented to person, place, and time.  Psychiatric:        Mood and Affect: Mood normal.        Behavior: Behavior normal.        Thought Content: Thought content normal.   Assessment & Plan:  1: Chronic heart failure with reduced ejection fraction- - NYHA class III - euvolemic today - weighing daily; reminded to call for an overnight weight gain of > 2 pounds or a weekly weight gain of >5 pounds - weight up 8 pounds from last visit here 6 weeks ago - not adding salt to his food and family doesn't cook with salt - saw cardiology Nehemiah Massed) 03/24/21 - on GDMT of losartan - had dizziness/ syncope with carvedilol - renal function will not allow for spironolactone or SGLT2 - drinking ~48 ounces of fluid/ day per nephrology - BNP 01/31/21 was >4500.00  2: HTN- - BP looks  good today (130/91) - saw PCP Edwina Barth) 05/15/21 - BMP 05/11/21 reviewed and showed sodium 134, potassium 4.8, creatinine 3.2 and GFR 18 - saw nephrology Holley Raring) 04/03/21  3: Lymphedema- - edema improves some over night - has being wearing compression socks daily but edema persists - has been exercising by walking at home - he will check with pulmonary rehab again to see if he can participate without having to walk for 6 minutes - will make referral for lymphapress compression boots   Medication list reviewed.  Return in 3 months or sooner for any questions/problems before then.

## 2021-05-19 NOTE — Patient Instructions (Signed)
Continue weighing daily and call for an overnight weight gain of > 2 pounds or a weekly weight gain of >5 pounds. 

## 2021-05-25 DIAGNOSIS — R001 Bradycardia, unspecified: Secondary | ICD-10-CM | POA: Diagnosis not present

## 2021-05-25 DIAGNOSIS — I350 Nonrheumatic aortic (valve) stenosis: Secondary | ICD-10-CM | POA: Diagnosis not present

## 2021-05-25 DIAGNOSIS — I700 Atherosclerosis of aorta: Secondary | ICD-10-CM | POA: Diagnosis not present

## 2021-05-25 DIAGNOSIS — I2510 Atherosclerotic heart disease of native coronary artery without angina pectoris: Secondary | ICD-10-CM | POA: Diagnosis not present

## 2021-05-25 DIAGNOSIS — I340 Nonrheumatic mitral (valve) insufficiency: Secondary | ICD-10-CM | POA: Diagnosis not present

## 2021-05-25 DIAGNOSIS — I471 Supraventricular tachycardia: Secondary | ICD-10-CM | POA: Diagnosis not present

## 2021-05-25 DIAGNOSIS — I77810 Thoracic aortic ectasia: Secondary | ICD-10-CM | POA: Diagnosis not present

## 2021-05-25 DIAGNOSIS — I10 Essential (primary) hypertension: Secondary | ICD-10-CM | POA: Diagnosis not present

## 2021-05-25 DIAGNOSIS — R0609 Other forms of dyspnea: Secondary | ICD-10-CM | POA: Diagnosis not present

## 2021-05-25 DIAGNOSIS — I255 Ischemic cardiomyopathy: Secondary | ICD-10-CM | POA: Diagnosis not present

## 2021-05-25 DIAGNOSIS — I493 Ventricular premature depolarization: Secondary | ICD-10-CM | POA: Diagnosis not present

## 2021-05-25 DIAGNOSIS — I5023 Acute on chronic systolic (congestive) heart failure: Secondary | ICD-10-CM | POA: Diagnosis not present

## 2021-05-25 DIAGNOSIS — I071 Rheumatic tricuspid insufficiency: Secondary | ICD-10-CM | POA: Diagnosis not present

## 2021-05-29 ENCOUNTER — Other Ambulatory Visit: Payer: Self-pay

## 2021-05-29 ENCOUNTER — Inpatient Hospital Stay
Admission: EM | Admit: 2021-05-29 | Discharge: 2021-06-01 | DRG: 682 | Disposition: A | Discharge status: Home / Self Care | Payer: PPO | Attending: Internal Medicine | Admitting: Internal Medicine

## 2021-05-29 ENCOUNTER — Emergency Department: Payer: PPO

## 2021-05-29 DIAGNOSIS — Z20822 Contact with and (suspected) exposure to covid-19: Secondary | ICD-10-CM | POA: Diagnosis not present

## 2021-05-29 DIAGNOSIS — J9811 Atelectasis: Secondary | ICD-10-CM | POA: Diagnosis present

## 2021-05-29 DIAGNOSIS — N2581 Secondary hyperparathyroidism of renal origin: Secondary | ICD-10-CM | POA: Diagnosis present

## 2021-05-29 DIAGNOSIS — J90 Pleural effusion, not elsewhere classified: Secondary | ICD-10-CM

## 2021-05-29 DIAGNOSIS — Z885 Allergy status to narcotic agent status: Secondary | ICD-10-CM

## 2021-05-29 DIAGNOSIS — N138 Other obstructive and reflux uropathy: Secondary | ICD-10-CM | POA: Diagnosis present

## 2021-05-29 DIAGNOSIS — I714 Abdominal aortic aneurysm, without rupture, unspecified: Secondary | ICD-10-CM | POA: Diagnosis present

## 2021-05-29 DIAGNOSIS — I10 Essential (primary) hypertension: Secondary | ICD-10-CM | POA: Diagnosis present

## 2021-05-29 DIAGNOSIS — Z91041 Radiographic dye allergy status: Secondary | ICD-10-CM | POA: Diagnosis not present

## 2021-05-29 DIAGNOSIS — J168 Pneumonia due to other specified infectious organisms: Secondary | ICD-10-CM | POA: Diagnosis not present

## 2021-05-29 DIAGNOSIS — R809 Proteinuria, unspecified: Secondary | ICD-10-CM | POA: Diagnosis not present

## 2021-05-29 DIAGNOSIS — I130 Hypertensive heart and chronic kidney disease with heart failure and stage 1 through stage 4 chronic kidney disease, or unspecified chronic kidney disease: Secondary | ICD-10-CM | POA: Diagnosis not present

## 2021-05-29 DIAGNOSIS — I5023 Acute on chronic systolic (congestive) heart failure: Secondary | ICD-10-CM | POA: Diagnosis not present

## 2021-05-29 DIAGNOSIS — Z952 Presence of prosthetic heart valve: Secondary | ICD-10-CM | POA: Diagnosis not present

## 2021-05-29 DIAGNOSIS — Z8701 Personal history of pneumonia (recurrent): Secondary | ICD-10-CM

## 2021-05-29 DIAGNOSIS — I2510 Atherosclerotic heart disease of native coronary artery without angina pectoris: Secondary | ICD-10-CM | POA: Diagnosis not present

## 2021-05-29 DIAGNOSIS — J690 Pneumonitis due to inhalation of food and vomit: Secondary | ICD-10-CM | POA: Diagnosis not present

## 2021-05-29 DIAGNOSIS — Z7982 Long term (current) use of aspirin: Secondary | ICD-10-CM

## 2021-05-29 DIAGNOSIS — D472 Monoclonal gammopathy: Secondary | ICD-10-CM | POA: Diagnosis present

## 2021-05-29 DIAGNOSIS — N184 Chronic kidney disease, stage 4 (severe): Secondary | ICD-10-CM | POA: Diagnosis present

## 2021-05-29 DIAGNOSIS — K219 Gastro-esophageal reflux disease without esophagitis: Secondary | ICD-10-CM | POA: Diagnosis present

## 2021-05-29 DIAGNOSIS — J189 Pneumonia, unspecified organism: Secondary | ICD-10-CM | POA: Diagnosis not present

## 2021-05-29 DIAGNOSIS — N401 Enlarged prostate with lower urinary tract symptoms: Secondary | ICD-10-CM | POA: Diagnosis not present

## 2021-05-29 DIAGNOSIS — N179 Acute kidney failure, unspecified: Principal | ICD-10-CM | POA: Diagnosis present

## 2021-05-29 DIAGNOSIS — Z888 Allergy status to other drugs, medicaments and biological substances status: Secondary | ICD-10-CM

## 2021-05-29 DIAGNOSIS — K449 Diaphragmatic hernia without obstruction or gangrene: Secondary | ICD-10-CM | POA: Diagnosis not present

## 2021-05-29 DIAGNOSIS — F419 Anxiety disorder, unspecified: Secondary | ICD-10-CM | POA: Diagnosis present

## 2021-05-29 DIAGNOSIS — I129 Hypertensive chronic kidney disease with stage 1 through stage 4 chronic kidney disease, or unspecified chronic kidney disease: Secondary | ICD-10-CM | POA: Diagnosis not present

## 2021-05-29 DIAGNOSIS — G4700 Insomnia, unspecified: Secondary | ICD-10-CM | POA: Diagnosis present

## 2021-05-29 DIAGNOSIS — I509 Heart failure, unspecified: Secondary | ICD-10-CM | POA: Diagnosis not present

## 2021-05-29 DIAGNOSIS — Z951 Presence of aortocoronary bypass graft: Secondary | ICD-10-CM

## 2021-05-29 DIAGNOSIS — E7800 Pure hypercholesterolemia, unspecified: Secondary | ICD-10-CM | POA: Diagnosis present

## 2021-05-29 DIAGNOSIS — Z9861 Coronary angioplasty status: Secondary | ICD-10-CM

## 2021-05-29 DIAGNOSIS — Z87891 Personal history of nicotine dependence: Secondary | ICD-10-CM | POA: Diagnosis not present

## 2021-05-29 DIAGNOSIS — D696 Thrombocytopenia, unspecified: Secondary | ICD-10-CM | POA: Diagnosis present

## 2021-05-29 DIAGNOSIS — Z96651 Presence of right artificial knee joint: Secondary | ICD-10-CM | POA: Diagnosis present

## 2021-05-29 DIAGNOSIS — D631 Anemia in chronic kidney disease: Secondary | ICD-10-CM | POA: Diagnosis present

## 2021-05-29 DIAGNOSIS — I255 Ischemic cardiomyopathy: Secondary | ICD-10-CM | POA: Diagnosis not present

## 2021-05-29 DIAGNOSIS — R1310 Dysphagia, unspecified: Secondary | ICD-10-CM | POA: Diagnosis not present

## 2021-05-29 DIAGNOSIS — Z79899 Other long term (current) drug therapy: Secondary | ICD-10-CM

## 2021-05-29 DIAGNOSIS — Z96659 Presence of unspecified artificial knee joint: Secondary | ICD-10-CM

## 2021-05-29 DIAGNOSIS — I5022 Chronic systolic (congestive) heart failure: Secondary | ICD-10-CM | POA: Diagnosis not present

## 2021-05-29 DIAGNOSIS — N189 Chronic kidney disease, unspecified: Secondary | ICD-10-CM | POA: Diagnosis present

## 2021-05-29 DIAGNOSIS — I517 Cardiomegaly: Secondary | ICD-10-CM | POA: Diagnosis not present

## 2021-05-29 DIAGNOSIS — R0602 Shortness of breath: Secondary | ICD-10-CM | POA: Diagnosis not present

## 2021-05-29 DIAGNOSIS — E871 Hypo-osmolality and hyponatremia: Secondary | ICD-10-CM | POA: Diagnosis not present

## 2021-05-29 LAB — BASIC METABOLIC PANEL
Anion gap: 14 (ref 5–15)
BUN: 110 mg/dL — ABNORMAL HIGH (ref 8–23)
CO2: 22 mmol/L (ref 22–32)
Calcium: 8.7 mg/dL — ABNORMAL LOW (ref 8.9–10.3)
Chloride: 98 mmol/L (ref 98–111)
Creatinine, Ser: 4.01 mg/dL — ABNORMAL HIGH (ref 0.61–1.24)
GFR, Estimated: 14 mL/min — ABNORMAL LOW (ref >60)
Glucose, Bld: 104 mg/dL — ABNORMAL HIGH (ref 70–99)
Potassium: 3.7 mmol/L (ref 3.5–5.1)
Sodium: 134 mmol/L — ABNORMAL LOW (ref 135–145)

## 2021-05-29 LAB — CBC
HCT: 33.7 % — ABNORMAL LOW (ref 39.0–52.0)
Hemoglobin: 11.4 g/dL — ABNORMAL LOW (ref 13.0–17.0)
MCH: 31.4 pg (ref 26.0–34.0)
MCHC: 33.8 g/dL (ref 30.0–36.0)
MCV: 92.8 fL (ref 80.0–100.0)
Platelets: 122 10*3/uL — ABNORMAL LOW (ref 150–400)
RBC: 3.63 MIL/uL — ABNORMAL LOW (ref 4.22–5.81)
RDW: 21.7 % — ABNORMAL HIGH (ref 11.5–15.5)
WBC: 6.5 10*3/uL (ref 4.0–10.5)
nRBC: 0 % (ref 0.0–0.2)

## 2021-05-29 LAB — RESP PANEL BY RT-PCR (FLU A&B, COVID) ARPGX2
Influenza A by PCR: NEGATIVE
Influenza B by PCR: NEGATIVE
SARS Coronavirus 2 by RT PCR: NEGATIVE

## 2021-05-29 LAB — BRAIN NATRIURETIC PEPTIDE: B Natriuretic Peptide: 4370.9 pg/mL — ABNORMAL HIGH (ref 0.0–100.0)

## 2021-05-29 MED ORDER — PRAVASTATIN SODIUM 20 MG PO TABS
20.0000 mg | ORAL_TABLET | Freq: Every evening | ORAL | Status: DC
  Administered 2021-05-29 – 2021-05-31 (×3): 20 mg via ORAL
  Filled 2021-05-29 (×3): qty 1

## 2021-05-29 MED ORDER — ONDANSETRON HCL 4 MG PO TABS: 4.0000 mg | ORAL_TABLET | Freq: Four times a day (QID) | ORAL | Status: DC | PRN

## 2021-05-29 MED ORDER — SODIUM CHLORIDE 0.9 % IV SOLN
3.0000 g | Freq: Once | INTRAVENOUS | Status: AC
  Administered 2021-05-29: 3 g via INTRAVENOUS
  Filled 2021-05-29: qty 8

## 2021-05-29 MED ORDER — ACETAMINOPHEN 325 MG PO TABS: 650.0000 mg | ORAL_TABLET | Freq: Four times a day (QID) | ORAL | Status: DC | PRN

## 2021-05-29 MED ORDER — FLUTICASONE PROPIONATE 50 MCG/ACT NA SUSP
2.0000 | Freq: Every day | NASAL | Status: DC
  Filled 2021-05-29 (×2): qty 16

## 2021-05-29 MED ORDER — HEPARIN SODIUM (PORCINE) 5000 UNIT/ML IJ SOLN
5000.0000 [IU] | Freq: Three times a day (TID) | INTRAMUSCULAR | Status: DC
  Administered 2021-05-29 – 2021-05-31 (×7): 5000 [IU] via SUBCUTANEOUS
  Filled 2021-05-29 (×8): qty 1

## 2021-05-29 MED ORDER — ALBUTEROL SULFATE (2.5 MG/3ML) 0.083% IN NEBU: 3.0000 mL | INHALATION_SOLUTION | Freq: Four times a day (QID) | RESPIRATORY_TRACT | Status: DC | PRN

## 2021-05-29 MED ORDER — SODIUM CHLORIDE 0.9 % IV SOLN: Freq: Once | INTRAVENOUS | Status: AC

## 2021-05-29 MED ORDER — ONDANSETRON HCL 4 MG/2ML IJ SOLN: 4.0000 mg | Freq: Four times a day (QID) | INTRAMUSCULAR | Status: DC | PRN

## 2021-05-29 MED ORDER — ACETAMINOPHEN 650 MG RE SUPP
650.0000 mg | Freq: Four times a day (QID) | RECTAL | Status: DC | PRN
Start: 2021-05-29 — End: 2021-06-01

## 2021-05-29 NOTE — ED Triage Notes (Signed)
Pt to ED from doctor sent for aspiration pneumonia for a week in half, states had xray done last week and was told had fluid and food particles in lung. Denies pain. Reports shob. Wears 2 l [place] NAD noted, alert and oriented.  Just finished 2 antibiotics for pneumonia

## 2021-05-29 NOTE — H&P (Signed)
History and Physical   [name]:[id number] DOB: [birth year] DOA: 05/29/2021  PCP: Baxter Hire, MD  Outpatient Specialists: Dr. Holley Raring Patient coming from: Nephrology office  I have personally briefly reviewed patient's old medical records in [place].  Chief Concern: Difficulty swallowing  HPI: [name] is a 87 y.o. male with medical history significant for CKD stage IV, hypertension, proteinuria, hyperparathyroidism due to renal insufficiency, hyperlipidemia, history of aspiration pneumonia treated outpatient, heart failure exacerbation, ischemic cardiomyopathy, bilateral carotid stenosis, severe aortic stenosis, paroxysmal SVT, EF of 20%, history of CABG, status post LIMA to LAD, SVG to OM1, OM2, D1, PDA, status post bioprosthetic valve replacement in 2011, who presents to the emergency department at the urging of his nephrology office for inpatient treatment of persistent aspiration pneumonia.  He reports he drinks at least three bottles of 16 oz per day. He pees well especially at night.  He states that his urination duration/amounts/frequency has been unchanged.  He states that his consumption of water has remained unchanged.  He states that he weighs himself every day.  He has difficulty swallowing. He reports this has been ongoing since May 2022. He denies passing out or lost of consciousness. He denies fever, chest pain, new shortness of breath. He denies new cough. He denies new swelling in his legs. He denies nausea, vomiting, diarrhea.   At bedside, patient is able to tell me his full name, his age, his current location of hospital, the current calendar year.  Patient lives at home by himself however he does perform most of his ADLs.  He states that he cooks for himself.  He states that he has a son and a daughter and they buy groceries for him.  He states he no longer drives.  He states that he frequently ambulates around his house with a walker in order to  get exercise.    He states that in the last week, his ability to ambulate around his house has remained unchanged.  He states that he presents emergency department at the urging of his nephrologist due to concerns of persistent presence of pneumonia on imaging outpatient.  He also would like a speech consult as he has difficulty swallowing sometimes and he notes that CT imaging showed that he has persistent attenuation/possible food matter on CT.  Social history: He lives by himself. He quit smoking at age 71 years old. He formerly smoked a pipe. He drinks etoh infrequently. He denies recreational drugs. 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  Vaccination history: He is vaccinated for covid-19, three from [employer].   ROS: Constitutional: no weight change, no fever ENT/Mouth: no sore throat, no rhinorrhea Eyes: no eye pain, no vision changes Cardiovascular: no chest pain, no dyspnea,  no edema, no palpitations Respiratory: no cough, no sputum, no wheezing Gastrointestinal: no nausea, no vomiting, no diarrhea, no constipation Genitourinary: no urinary incontinence, no dysuria, no hematuria Musculoskeletal: no arthralgias, no myalgias Skin: no skin lesions, no pruritus, Neuro: + weakness, no loss of consciousness, no syncope Psych: no anxiety, no depression, + decrease appetite Heme/Lymph: no bruising, no bleeding  ED Course: Discussed with emergency medicine provider, patient requiring hospitalization for chief concerns of persistent pneumonia failed outpatient therapy.  Vitals in the emergency department was remarkable for temperature 97.9, respiration rate of 20, heart rate of 65, blood pressure 121/81, SPO2 of 99%.  Assessment/Plan  Principal Problem:   AKI (acute kidney injury) ([place]) Active Problems:  Abdominal aortic aneurysm (AAA) without rupture (HCC)   Benign essential HTN   Benign prostatic hyperplasia with urinary obstruction   Chronic kidney disease    CAD in native artery   S/P total knee arthroplasty   MGUS (monoclonal gammopathy of unknown significance)   Acute renal failure superimposed on stage 4 chronic kidney disease (HCC)   # Aspiration pneumonia-present on admission - Patient was initially treated with cefuroxime and had issues with swelling of his face and was ultimately switched to doxycycline - Patient completed doxycycline on day of presentation -On review of patient's 2 view chest x-ray ordered in the emergency department, I do not perceive worsening pneumonia - Status post Unasyn 1 dose per EDP - Check procalcitonin-if elevated consider initiation of antibiotics including Zosyn for aspiration coverage - Patient has a history of food products in bilateral lower lobe collapse/consolidation with high attenuation material again noted in collapsed lung parenchyma - SLP consulted at the request of patient  # Bilateral pleural effusion-status post Lasix 60 mg IV once - Repeat portable chest x-ray in the a.m., if no improvement, recommend a.m. team to consult IR for thoracentesis  # Acute kidney injury on CKD4-presumed secondary to cardiorenal in setting of small loculated appearing bilateral pleural effusion and associated atelectasis/consolidation - Lasix 60 mg one-time dose - AM team to consult neprhology  - Patient at bedside adamantly states that he does not want dialysis - BMP in the a.m.  # Difficulty swallowing/dysphagia-SLP ordered - Dysphagia diet type II ordered - A.m. team to consider GI consultation and/or referral patient to GI - I advised that patient could also consider speaking with his PCP for a GI referral for endoscopy  # CAD - resumed aspirin 81 mg daily, pravastatin 20 mg nightly  # Heart failure reduced ejection fraction-resumed home amiodarone 200 mg daily # Hypertension # Hypertensive cardiovascular disease # Insomnia # History of MGUS # Hyperlipidemia  #  Insomnia-resumed home melatonin 1.5 mg  p.o. nightly as needed for sleep # Anxiety - alprazolam 0.25 mg p.o. twice daily as needed for anxiety resumed # Progressive mobility-PT, OT ordered  Chart reviewed.   Complete echo ordered for 01/31/2021: Left ventricular ejection fraction by estimated less than 20%, severely decreased function.  DVT prophylaxis: heparin 5000 units subc q8h  Code Status: Limited. He does want chest compression, shock, acls treatments. He does not want intubation, or hemodialysis Diet: dysphagia 2 Family Communication: No Disposition Plan: Pending clinical course Consults called: none at this time, AM  Admission status: MedSurg, observation, telemetry  Past Medical History:  Diagnosis Date   Anemia    Aortic aneurysm (HCC)    Aortic aneurysm ([place])    Arthritis    CHF (congestive heart failure) (HCC)    CKD (chronic kidney disease)    ALSO LEFT RENAL MASS   Coronary artery disease    Diverticulosis    WITH RUPTURE   GERD (gastroesophageal reflux disease)    History of hiatal hernia    Hypercholesteremia    Hypertension    MGUS (monoclonal gammopathy of unknown significance)    MGUS (monoclonal gammopathy of unknown significance)    Past Surgical History:  Procedure Laterality Date   AORTIC VALVE REPLACEMENT     bipass     CARDIAC VALVE REPLACEMENT     pig valve   CHOLECYSTECTOMY     COLON SURGERY     CORONARY ANGIOPLASTY     CORONARY ARTERY BYPASS GRAFT     ESOPHAGOGASTRODUODENOSCOPY (EGD) WITH PROPOFOL  N/A 02/07/2015   Procedure: ESOPHAGOGASTRODUODENOSCOPY (EGD) WITH PROPOFOL;  Surgeon: Hulen Luster, MD;  Location: [facility] ENDOSCOPY;  Service: Gastroenterology;  Laterality: N/A;   EYE SURGERY     HERNIA REPAIR     INTRAMEDULLARY (IM) NAIL INTERTROCHANTERIC Right 05/12/2020   Procedure: INTRAMEDULLARY (IM) NAIL INTERTROCHANTRIC;  Surgeon: Lovell Sheehan, MD;  Location: [facility] ORS;  Service: Orthopedics;  Laterality: Right;   JOINT REPLACEMENT     left knee x4   KNEE ARTHROPLASTY Right  04/02/2016   Procedure: COMPUTER ASSISTED TOTAL KNEE ARTHROPLASTY;  Surgeon: Dereck Leep, MD;  Location: [facility] ORS;  Service: Orthopedics;  Laterality: Right;   ORIF FEMUR FRACTURE Right 08/08/2020   Procedure: OPEN REDUCTION INTERNAL FIXATION (ORIF) DISTAL FEMUR FRACTURE;  Surgeon: Lovell Sheehan, MD;  Location: [facility] ORS;  Service: Orthopedics;  Laterality: Right;   Social History:  reports that he has quit smoking. He has never used smokeless tobacco. He reports that he does not drink alcohol and does not use drugs.  Allergies  Allergen Reactions   Morphine Anaphylaxis   Oxycodone Other (See Comments)    Hypotension and bradycardia   Carvedilol     Dizziness and syncope    Iodinated Diagnostic Agents Rash    Other reaction(s): Asthenia (finding), Other (qualifier value) paralyzed legs Other reaction(s): RASH    Lisinopril Cough and Other (See Comments)   Family History  Problem Relation Age of Onset   Bladder Cancer Neg Hx    Prostate cancer Neg Hx    Kidney cancer Neg Hx    Family history: Family history reviewed and not pertinent  Prior to Admission medications   Medication Sig Start Date End Date Taking? Authorizing Provider  albuterol (VENTOLIN HFA) 108 (90 Base) MCG/ACT inhaler Inhale into the lungs. 01/16/21 01/16/22  [provider]  ALPRAZolam Duanne Moron) 0.25 MG tablet Take 0.25 mg by mouth 2 (two) times daily as needed. 08/26/20   [provider]  amiodarone (PACERONE) 200 MG tablet Take 1 tablet (200 mg total) by mouth 2 (two) times daily. 02/08/21   Wouk, Ailene Rud, MD  aspirin EC 81 MG tablet Take 1 tablet (81 mg total) by mouth 2 (two) times daily. Swallow whole. 08/10/20   Donne Hazel, MD  fluticasone [facility]) 50 MCG/ACT nasal spray USE 2 SPRAYS INTO BOTH NOSTRILS DAILY 10/11/20   [provider]  losartan (COZAAR) 25 MG tablet Take 1 tablet (25 mg total) by mouth daily. 02/08/21   Wouk, Ailene Rud, MD  Melatonin 3 MG TABS Take 1.5 mg  by mouth at bedtime as needed (sleep).    [provider]  potassium chloride SA (KLOR-CON) 20 MEQ tablet Take 2 tablets (40 mEq total) by mouth daily. 02/08/21   Wouk, Ailene Rud, MD  pravastatin (PRAVACHOL) 20 MG tablet Take 20 mg by mouth every evening.    [provider]  torsemide (DEMADEX) 20 MG tablet Take 2 tablets (40 mg total) by mouth daily. 09/10/20   Lorella Nimrod, MD  traMADol (ULTRAM) 50 MG tablet Take 50 mg by mouth every 4 (four) hours as needed. 08/16/20   [provider]   Physical Exam: Vitals:   05/29/21 2005 05/29/21 2014 05/29/21 2030 05/29/21 2100  BP: 121/84  127/87 129/87  Pulse: 67  62 63  Resp: 14  18 17   Temp:      TempSrc:      SpO2: 96% 98% 100% 99%  Weight:      Height:  Constitutional: appears younger than chronological age, NAD, calm, comfortable Eyes: PERRL, lids and conjunctivae normal ENMT: Mucous membranes are moist. Posterior pharynx clear of any exudate or lesions. Age-appropriate dentition. Hearing appropriate Neck: normal, supple, no masses, no thyromegaly Respiratory: clear to auscultation bilaterally, no wheezing, no crackles. Normal respiratory effort. No accessory muscle use.  Cardiovascular: Regular rate and rhythm, no murmurs / rubs / gallops. 1+ bilateral lower extremity edema. 2+ pedal pulses. No carotid bruits.  Abdomen: no tenderness, no masses palpated, no hepatosplenomegaly. Bowel sounds positive.  Musculoskeletal: no clubbing / cyanosis. No joint deformity upper and lower extremities. Good ROM, no contractures, no atrophy. Normal muscle tone.  Skin: no rashes, lesions, ulcers. No induration Neurologic: Sensation intact. Strength 5/5 in all 4.  Psychiatric: Normal judgment and insight. Alert and oriented x 3. Normal mood.   EKG: independently reviewed, showing sinus rate of 65, QTC 507.  Chest x-ray on Admission: I personally reviewed and I agree with radiologist reading as below.  DG Chest 2  View  Result Date: 05/29/2021 CLINICAL DATA:  Shortness of breath, pneumonia EXAM: CHEST - 2 VIEW COMPARISON:  02/07/2021 FINDINGS: Gross cardiomegaly status post median sternotomy and CABG with aortic valve prosthesis. Small, loculated appearing bilateral pleural effusions and associated atelectasis or consolidation, similar in appearance to prior examination. Mild, diffuse interstitial pulmonary opacity and pulmonary vascular prominence. IMPRESSION: 1. Small, loculated appearing bilateral pleural effusions and associated atelectasis or consolidation, similar in appearance to prior examination. 2. Mild, diffuse interstitial pulmonary opacity and pulmonary vascular prominence, likely mild edema in the setting of cardiomegaly and similar to prior. No focal airspace opacity. Electronically Signed   By: Eddie Candle M.D.   On: 05/29/2021 18:26    Labs on Admission: I have personally reviewed following labs  CBC: Recent Labs  Lab 05/29/21 1736  WBC 6.5  HGB 11.4*  HCT 33.7*  MCV 92.8  PLT 158*   Basic Metabolic Panel: Recent Labs  Lab 05/29/21 1736  NA 134*  K 3.7  CL 98  CO2 22  GLUCOSE 104*  BUN 110*  CREATININE 4.01*  CALCIUM 8.7*   GFR: Estimated Creatinine Clearance: 13.1 mL/min (A) (by C-G formula based on SCr of 4.01 mg/dL (H)).  Urine analysis:    Component Value Date/Time   COLORURINE YELLOW (A) 08/09/2020 1637   APPEARANCEUR CLEAR (A) 08/09/2020 1637   APPEARANCEUR Clear 11/22/2015 0950   LABSPEC 1.012 08/09/2020 1637   PHURINE 6.0 08/09/2020 1637   GLUCOSEU NEGATIVE 08/09/2020 1637   HGBUR SMALL (A) 08/09/2020 1637   BILIRUBINUR NEGATIVE 08/09/2020 1637   BILIRUBINUR Negative 11/22/2015 0950   KETONESUR NEGATIVE 08/09/2020 1637   PROTEINUR 30 (A) 08/09/2020 1637   UROBILINOGEN 1.0 02/16/2010 1336   NITRITE NEGATIVE 08/09/2020 1637   LEUKOCYTESUR NEGATIVE 08/09/2020 1637   Dr. Tobie Poet [facility]  If 7PM-7AM, please contact overnight-coverage provider If  7AM-7PM, please contact day coverage provider [email or web address]  05/29/2021, 10:03 PM

## 2021-05-29 NOTE — ED Provider Notes (Signed)
[facility] Emergency Department Provider Note   ____________________________________________   I have reviewed the triage vital signs and the nursing notes.   HISTORY  Chief Complaint Shortness of Breath   History limited by: Not Limited   HPI [name] is a 87 y.o. male who presents to the emergency department today at the advice of his nephrologist for IV antibiotics.  Patient states that he has been treated for aspiration pneumonia over the past month and a half.  He states that they think he is having difficulty with swallowing that is causing the aspiration.  While the patient denies any significant chest pain or worsening shortness of breath he does state that he has been getting increasingly weak.  He does have some baseline lung disease and is on oxygen at home.  Patient denies any high fevers although has had occasional low-grade fevers. Just finished course of doxycycline this morning.    Records reviewed. Per medical record review patient has a history of CHF, CKD, HLD, HTN.   Past Medical History:  Diagnosis Date   Anemia    Aortic aneurysm (HCC)    Aortic aneurysm (HCC)    Arthritis    CHF (congestive heart failure) (HCC)    CKD (chronic kidney disease)    ALSO LEFT RENAL MASS   Coronary artery disease    Diverticulosis    WITH RUPTURE   GERD (gastroesophageal reflux disease)    History of hiatal hernia    Hypercholesteremia    Hypertension    MGUS (monoclonal gammopathy of unknown significance)    MGUS (monoclonal gammopathy of unknown significance)     Patient Active Problem List   Diagnosis Date Noted   Unstageable pressure ulcer of sacral region ([place]) 02/01/2021   CHF exacerbation ([place]) 01/31/2021   CHF (congestive heart failure), NYHA class IV, acute on chronic, systolic ([place]) 72/62/0355   Acute respiratory failure with hypoxia ([place]) 09/07/2020   Elevated troponin 09/07/2020   Pleural effusion 09/07/2020   Acute on  chronic systolic CHF (congestive heart failure) ([place]) 09/07/2020   Thoracic compression fracture ([place]) 09/07/2020   Pressure injury of skin 08/07/2020   CHF (congestive heart failure) ([place]) 08/05/2020   Periprosthetic fracture around internal prosthetic hip joint 08/05/2020   Hyponatremia 08/05/2020   Constipation 05/15/2020   Acute renal failure superimposed on stage 4 chronic kidney disease ([place]) 05/15/2020   Hip fracture ([place]) 05/11/2020   MGUS (monoclonal gammopathy of unknown significance) 05/23/2016   S/P total knee arthroplasty 04/02/2016   Chronic kidney disease 11/22/2015   CAD in native artery 11/22/2015   TI (tricuspid incompetence) 05/11/2015   Early satiety 01/25/2015   Benign essential HTN 01/03/2015   Abdominal aortic aneurysm (AAA) without rupture ([place]) 07/09/2014   MI (mitral incompetence) 07/09/2014   Aortic heart valve narrowing 07/09/2014   Cardiomyopathy, ischemic 06/28/2014   Arthritis of knee, degenerative 05/17/2014   Kidney lump 04/09/2013   Benign prostatic hyperplasia with urinary obstruction 03/17/2013   Chronic kidney disease, stage IV (severe) ([place]) 03/17/2013   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    Past Surgical History:  Procedure Laterality Date   AORTIC VALVE REPLACEMENT     bipass     CARDIAC VALVE REPLACEMENT     pig valve   CHOLECYSTECTOMY     COLON SURGERY     CORONARY ANGIOPLASTY     CORONARY ARTERY BYPASS GRAFT     ESOPHAGOGASTRODUODENOSCOPY (EGD) WITH PROPOFOL N/A 02/07/2015   Procedure: ESOPHAGOGASTRODUODENOSCOPY (  EGD) WITH PROPOFOL;  Surgeon: Hulen Luster, MD;  Location: [facility] ENDOSCOPY;  Service: Gastroenterology;  Laterality: N/A;   EYE SURGERY     HERNIA REPAIR     INTRAMEDULLARY (IM) NAIL INTERTROCHANTERIC Right 05/12/2020   Procedure: INTRAMEDULLARY (IM) NAIL INTERTROCHANTRIC;  Surgeon: Lovell Sheehan, MD;  Location: [facility] ORS;  Service: Orthopedics;  Laterality: Right;   JOINT REPLACEMENT     left knee x4    KNEE ARTHROPLASTY Right 04/02/2016   Procedure: COMPUTER ASSISTED TOTAL KNEE ARTHROPLASTY;  Surgeon: Dereck Leep, MD;  Location: [facility] ORS;  Service: Orthopedics;  Laterality: Right;   ORIF FEMUR FRACTURE Right 08/08/2020   Procedure: OPEN REDUCTION INTERNAL FIXATION (ORIF) DISTAL FEMUR FRACTURE;  Surgeon: Lovell Sheehan, MD;  Location: [facility] ORS;  Service: Orthopedics;  Laterality: Right;    Prior to Admission medications   Medication Sig Start Date End Date Taking? Authorizing Provider  albuterol (VENTOLIN HFA) 108 (90 Base) MCG/ACT inhaler Inhale into the lungs. 01/16/21 01/16/22  [provider]  ALPRAZolam Duanne Moron) 0.25 MG tablet Take 0.25 mg by mouth 2 (two) times daily as needed. 08/26/20   [provider]  amiodarone (PACERONE) 200 MG tablet Take 1 tablet (200 mg total) by mouth 2 (two) times daily. 02/08/21   Wouk, Ailene Rud, MD  aspirin EC 81 MG tablet Take 1 tablet (81 mg total) by mouth 2 (two) times daily. Swallow whole. 08/10/20   Donne Hazel, MD  fluticasone [facility]) 50 MCG/ACT nasal spray USE 2 SPRAYS INTO BOTH NOSTRILS DAILY 10/11/20   [provider]  losartan (COZAAR) 25 MG tablet Take 1 tablet (25 mg total) by mouth daily. 02/08/21   Wouk, Ailene Rud, MD  Melatonin 3 MG TABS Take 1.5 mg by mouth at bedtime as needed (sleep).    [provider]  potassium chloride SA (KLOR-CON) 20 MEQ tablet Take 2 tablets (40 mEq total) by mouth daily. 02/08/21   Wouk, Ailene Rud, MD  pravastatin (PRAVACHOL) 20 MG tablet Take 20 mg by mouth every evening.    [provider]  torsemide (DEMADEX) 20 MG tablet Take 2 tablets (40 mg total) by mouth daily. 09/10/20   Lorella Nimrod, MD  traMADol (ULTRAM) 50 MG tablet Take 50 mg by mouth every 4 (four) hours as needed. 08/16/20   [provider]    Allergies Morphine, Oxycodone, Carvedilol, Iodinated diagnostic agents, and Lisinopril  Family History  Problem Relation Age of Onset    Bladder Cancer Neg Hx    Prostate cancer Neg Hx    Kidney cancer Neg Hx     Social History Social History   Tobacco Use   Smoking status: Former   Smokeless tobacco: Never  Substance Use Topics   Alcohol use: No   Drug use: No    Review of Systems Constitutional: Positive for low grade fevers. Positive for increasing generalized weakness.  Eyes: No visual changes. ENT: No sore throat. Cardiovascular: Denies chest pain. Respiratory: Positive for chronic O2 use  Gastrointestinal: No abdominal pain.  Positive for difficulty swallowing.  Genitourinary: Negative for dysuria. Musculoskeletal: Negative for back pain. Skin: Negative for rash. Neurological: Negative for headaches, focal weakness or numbness.  ____________________________________________   PHYSICAL EXAM:  VITAL SIGNS: ED Triage Vitals  Enc Vitals Group     BP 05/29/21 1739 121/81     Pulse Rate 05/29/21 1739 65     Resp 05/29/21 1739 20     Temp 05/29/21 1739 97.9 F (36.6 C)  Temp Source 05/29/21 1739 Oral     SpO2 05/29/21 1739 99 %     Weight 05/29/21 1736 161 lb (73 kg)     Height 05/29/21 1736 5\' 11"  (1.803 m)     Head Circumference --      Peak Flow --      Pain Score 05/29/21 1735 0   Constitutional: Alert and oriented.  Eyes: Conjunctivae are normal.  ENT      Head: Normocephalic and atraumatic.      Nose: No congestion/rhinnorhea.      Mouth/Throat: Mucous membranes are moist.      Neck: No stridor. Hematological/Lymphatic/Immunilogical: No cervical lymphadenopathy. Cardiovascular: Normal rate, regular rhythm.  No murmurs, rubs, or gallops.  Respiratory: Normal respiratory effort without tachypnea nor retractions. Crackles appreciated at bases.  Gastrointestinal: Soft and non tender. No rebound. No guarding.  Genitourinary: Deferred Musculoskeletal: Normal range of motion in all extremities. No lower extremity edema. Neurologic:  Normal speech and language. No gross focal neurologic  deficits are appreciated.  Skin:  Skin is warm, dry and intact. No rash noted. Psychiatric: Mood and affect are normal. Speech and behavior are normal. Patient exhibits appropriate insight and judgment.  ____________________________________________    LABS (pertinent positives/negatives)  BMP na 134, k 3.7, glu 104, cr 4.01 CBC wbc 6.5, hgb 11.4, plt 122  ____________________________________________   EKG  I, Nance Pear, attending physician, personally viewed and interpreted this EKG  EKG Time: 1739 Rate: 65 Rhythm: sinus rhythm with 1st degree av block Axis: left axis deviation Intervals: qtc 507 QRS: LBBB ST changes: no st elevation Impression: abnormal ekg   ____________________________________________    RADIOLOGY  CXR Loculated bilateral pleural effusions with associated consolidation vs atelectasis. Likely pulmonary edema as well.  ____________________________________________   PROCEDURES  Procedures  ____________________________________________   INITIAL IMPRESSION / ASSESSMENT AND PLAN / ED COURSE  Pertinent labs & imaging results that were available during my care of the patient were reviewed by me and considered in my medical decision making (see chart for details).   Patient presented to the emergency department today at the advice of nephrology for IV antibiotics for persistent pneumonia.  Work-up here in the emergency department does have x-ray concerning for possible consolidation.  Will start IV antibiotics.  Patient presented to the AKI.  Will start IV fluids.  ____________________________________________   FINAL CLINICAL IMPRESSION(S) / ED DIAGNOSES  Final diagnoses:  Pneumonia due to infectious organism, unspecified laterality, unspecified part of lung  AKI (acute kidney injury) ([place])     Note: This dictation was prepared with Dragon dictation. Any transcriptional errors that result from this process are unintentional      Nance Pear, MD 05/29/21 2145

## 2021-05-30 ENCOUNTER — Encounter: Payer: Self-pay | Admitting: Internal Medicine

## 2021-05-30 DIAGNOSIS — N401 Enlarged prostate with lower urinary tract symptoms: Secondary | ICD-10-CM | POA: Diagnosis present

## 2021-05-30 DIAGNOSIS — J90 Pleural effusion, not elsewhere classified: Secondary | ICD-10-CM | POA: Diagnosis present

## 2021-05-30 DIAGNOSIS — D631 Anemia in chronic kidney disease: Secondary | ICD-10-CM | POA: Diagnosis present

## 2021-05-30 DIAGNOSIS — J9811 Atelectasis: Secondary | ICD-10-CM | POA: Diagnosis present

## 2021-05-30 DIAGNOSIS — N138 Other obstructive and reflux uropathy: Secondary | ICD-10-CM | POA: Diagnosis present

## 2021-05-30 DIAGNOSIS — K449 Diaphragmatic hernia without obstruction or gangrene: Secondary | ICD-10-CM | POA: Diagnosis present

## 2021-05-30 DIAGNOSIS — R1310 Dysphagia, unspecified: Secondary | ICD-10-CM | POA: Diagnosis present

## 2021-05-30 DIAGNOSIS — N179 Acute kidney failure, unspecified: Secondary | ICD-10-CM | POA: Diagnosis present

## 2021-05-30 DIAGNOSIS — D696 Thrombocytopenia, unspecified: Secondary | ICD-10-CM | POA: Diagnosis present

## 2021-05-30 DIAGNOSIS — J690 Pneumonitis due to inhalation of food and vomit: Secondary | ICD-10-CM | POA: Diagnosis present

## 2021-05-30 DIAGNOSIS — D472 Monoclonal gammopathy: Secondary | ICD-10-CM | POA: Diagnosis present

## 2021-05-30 DIAGNOSIS — Z952 Presence of prosthetic heart valve: Secondary | ICD-10-CM | POA: Diagnosis not present

## 2021-05-30 DIAGNOSIS — I130 Hypertensive heart and chronic kidney disease with heart failure and stage 1 through stage 4 chronic kidney disease, or unspecified chronic kidney disease: Secondary | ICD-10-CM | POA: Diagnosis present

## 2021-05-30 DIAGNOSIS — Z91041 Radiographic dye allergy status: Secondary | ICD-10-CM | POA: Diagnosis not present

## 2021-05-30 DIAGNOSIS — N184 Chronic kidney disease, stage 4 (severe): Secondary | ICD-10-CM | POA: Diagnosis present

## 2021-05-30 DIAGNOSIS — N2581 Secondary hyperparathyroidism of renal origin: Secondary | ICD-10-CM | POA: Diagnosis present

## 2021-05-30 DIAGNOSIS — I5023 Acute on chronic systolic (congestive) heart failure: Secondary | ICD-10-CM | POA: Diagnosis present

## 2021-05-30 DIAGNOSIS — I714 Abdominal aortic aneurysm, without rupture: Secondary | ICD-10-CM | POA: Diagnosis present

## 2021-05-30 DIAGNOSIS — Z885 Allergy status to narcotic agent status: Secondary | ICD-10-CM | POA: Diagnosis not present

## 2021-05-30 DIAGNOSIS — Z888 Allergy status to other drugs, medicaments and biological substances status: Secondary | ICD-10-CM | POA: Diagnosis not present

## 2021-05-30 DIAGNOSIS — Z87891 Personal history of nicotine dependence: Secondary | ICD-10-CM | POA: Diagnosis not present

## 2021-05-30 DIAGNOSIS — Z20822 Contact with and (suspected) exposure to covid-19: Secondary | ICD-10-CM | POA: Diagnosis present

## 2021-05-30 DIAGNOSIS — I2510 Atherosclerotic heart disease of native coronary artery without angina pectoris: Secondary | ICD-10-CM | POA: Diagnosis present

## 2021-05-30 DIAGNOSIS — Z951 Presence of aortocoronary bypass graft: Secondary | ICD-10-CM | POA: Diagnosis not present

## 2021-05-30 DIAGNOSIS — I255 Ischemic cardiomyopathy: Secondary | ICD-10-CM | POA: Diagnosis present

## 2021-05-30 LAB — URINALYSIS, COMPLETE (UACMP) WITH MICROSCOPIC
Bilirubin Urine: NEGATIVE
Glucose, UA: NEGATIVE mg/dL
Ketones, ur: NEGATIVE mg/dL
Leukocytes,Ua: NEGATIVE
Nitrite: NEGATIVE
Protein, ur: NEGATIVE mg/dL
Specific Gravity, Urine: 1.006 (ref 1.005–1.030)
pH: 6 (ref 5.0–8.0)

## 2021-05-30 LAB — BASIC METABOLIC PANEL
Anion gap: 13 (ref 5–15)
BUN: 110 mg/dL — ABNORMAL HIGH (ref 8–23)
CO2: 23 mmol/L (ref 22–32)
Calcium: 8.6 mg/dL — ABNORMAL LOW (ref 8.9–10.3)
Chloride: 99 mmol/L (ref 98–111)
Creatinine, Ser: 3.88 mg/dL — ABNORMAL HIGH (ref 0.61–1.24)
GFR, Estimated: 14 mL/min — ABNORMAL LOW (ref >60)
Glucose, Bld: 99 mg/dL (ref 70–99)
Potassium: 3.6 mmol/L (ref 3.5–5.1)
Sodium: 135 mmol/L (ref 135–145)

## 2021-05-30 LAB — CBC
HCT: 32.8 % — ABNORMAL LOW (ref 39.0–52.0)
Hemoglobin: 11.2 g/dL — ABNORMAL LOW (ref 13.0–17.0)
MCH: 31.7 pg (ref 26.0–34.0)
MCHC: 34.1 g/dL (ref 30.0–36.0)
MCV: 92.9 fL (ref 80.0–100.0)
Platelets: 111 10*3/uL — ABNORMAL LOW (ref 150–400)
RBC: 3.53 MIL/uL — ABNORMAL LOW (ref 4.22–5.81)
RDW: 21.6 % — ABNORMAL HIGH (ref 11.5–15.5)
WBC: 5.7 10*3/uL (ref 4.0–10.5)
nRBC: 0 % (ref 0.0–0.2)

## 2021-05-30 LAB — PROCALCITONIN: Procalcitonin: <0.1 ng/mL

## 2021-05-30 MED ORDER — FUROSEMIDE 10 MG/ML IJ SOLN
60.0000 mg | Freq: Once | INTRAMUSCULAR | Status: AC
  Administered 2021-05-30: 60 mg via INTRAVENOUS
  Filled 2021-05-30: qty 8

## 2021-05-30 MED ORDER — POTASSIUM CHLORIDE CRYS ER 20 MEQ PO TBCR
40.0000 meq | EXTENDED_RELEASE_TABLET | Freq: Every day | ORAL | Status: DC
  Administered 2021-05-30 – 2021-06-01 (×3): 40 meq via ORAL
  Filled 2021-05-30 (×3): qty 2

## 2021-05-30 MED ORDER — AMIODARONE HCL 200 MG PO TABS
200.0000 mg | ORAL_TABLET | Freq: Every day | ORAL | Status: DC
  Administered 2021-05-30 – 2021-06-01 (×3): 200 mg via ORAL
  Filled 2021-05-30 (×4): qty 1

## 2021-05-30 MED ORDER — ENSURE ENLIVE PO LIQD
237.0000 mL | Freq: Two times a day (BID) | ORAL | Status: DC
  Administered 2021-05-30 – 2021-05-31 (×2): 237 mL via ORAL

## 2021-05-30 MED ORDER — FUROSEMIDE 10 MG/ML IJ SOLN
60.0000 mg | Freq: Every day | INTRAMUSCULAR | Status: DC
  Administered 2021-05-30 – 2021-05-31 (×2): 60 mg via INTRAVENOUS
  Filled 2021-05-30 (×2): qty 8

## 2021-05-30 MED ORDER — FUROSEMIDE 10 MG/ML IJ SOLN: 60.0000 mg | Freq: Once | INTRAMUSCULAR | Status: DC

## 2021-05-30 MED ORDER — MELATONIN 5 MG PO TABS: 2.5000 mg | ORAL_TABLET | Freq: Every evening | ORAL | Status: DC | PRN

## 2021-05-30 MED ORDER — FUROSEMIDE 10 MG/ML IJ SOLN: 80.0000 mg | Freq: Once | INTRAMUSCULAR | Status: DC

## 2021-05-30 MED ORDER — ALPRAZOLAM 0.25 MG PO TABS: 0.2500 mg | ORAL_TABLET | Freq: Two times a day (BID) | ORAL | Status: DC | PRN

## 2021-05-30 MED ORDER — ASPIRIN EC 81 MG PO TBEC: 81.0000 mg | DELAYED_RELEASE_TABLET | Freq: Two times a day (BID) | ORAL | Status: DC

## 2021-05-30 MED ORDER — ASPIRIN EC 81 MG PO TBEC
81.0000 mg | DELAYED_RELEASE_TABLET | Freq: Every day | ORAL | Status: DC
  Administered 2021-05-30 – 2021-06-01 (×3): 81 mg via ORAL
  Filled 2021-05-30 (×3): qty 1

## 2021-05-30 NOTE — Progress Notes (Signed)
Initial Nutrition Assessment  DOCUMENTATION CODES:   Not applicable  INTERVENTION:   - Ensure Enlive po BID, each supplement provides 350 kcal and 20 grams of protein  - Encourage PO intake  NUTRITION DIAGNOSIS:   Increased nutrient needs related to chronic illness (CHF) as evidenced by estimated needs.  GOAL:   Patient will meet greater than or equal to 90% of their needs  MONITOR:   Supplement acceptance, PO intake, Labs, Weight trends, Skin, I & O's  REASON FOR ASSESSMENT:   Malnutrition Screening Tool    ASSESSMENT:   87 year old male who presented to the ED on 9/12 with SOB after being treated for aspiration pneumonia. PMH of CKD stage IV, HTN, proteinuria, hyperparathyroidism due to renal insufficiency, HLD, aspiration pneumonia, CHF, dysphagia. Pt admitted with aspiration pneumonia, AKI.  Spoke with pt via phone call to room. Pt reports that he prefers to eat mostly soft foods. Pt states that he eats steak every now and again but that he does not eat hamburgers. Pt states that he also does not eat a lot of bread. Pt occasionally gets McDonald's to eat. Pt states that his family brings or makes him food to eat and that they also bring his groceries. Pt states that he consumes 3 16-oz water bottles each day. He occasionally consumes sugar-free Coke and whiskey. Pt also consumes protein shakes (Ensure Original, Ensure Max, Ensure Plus) at home.  Pt typically eats 2-3 meals. Breakfast includes a protein shake or an egg cup made with eggs, sausage, cheese, and vegetables. Pt reports that he eats a light lunch. For dinner, pt may have more of a snack or a piece of cake as he is very full and bloated by this point in the day. Pt states that his swallowing is "not that hard" and that his main issue is early satiety, bloating/gas, and other reflux-type symptoms.  Pt states that he weighs himself daily. He reports that his weight fluctuates between 161-165 lbs. Reviewed weight  history in chart. Weight stable between 73-76 kg over the last 10 months. Weight on admission of 161 lbs (73 kg) appears to be stated rather than measured. Furthermore, pt with +4 pitting edema to RLE and +3 pitting edema to LLE per nursing documentation. Unsure of pt's dry weight at this time.  Discussed with pt the importance of adequate kcal and protein intake in meeting nutritional needs, promoting maintenance of lean muscle mass, and preventing dry weight loss. Pt expresses understanding. Encouraged pt to follow up with GI doctor as an outpatient. Pt willing to consume oral nutrition supplements during admission. RD encouraged pt to consume at least 2 oral nutrition supplements daily at discharge.  Meal Completion: 90% x 1 documented meal  Medications reviewed and include: IV lasix, klor-con 40 mEq daily  Labs reviewed: BUN 110, creatinine 3.88  UOP: 800 x 12 hours I/O's: -1.8 L since admit  NUTRITION - FOCUSED PHYSICAL EXAM:  Unable to complete at this time. RD working remotely.  Diet Order:   Diet Order             DIET DYS 3 Room service appropriate? Yes; Fluid consistency: Thin  Diet effective now                   EDUCATION NEEDS:   Education needs have been addressed  Skin:  Skin Assessment: Skin Integrity Issues: Stage I: R buttocks, L buttocks  Last BM:  05/29/21  Height:   Ht Readings from Last 1 Encounters:  05/30/21 5\' 11"  (1.803 m)    Weight:   Wt Readings from Last 1 Encounters:  05/29/21 73 kg    BMI:  Body mass index is 22.45 kg/m.  Estimated Nutritional Needs:   Kcal:  1800-2000  Protein:  85-100 grams  Fluid:  >/= 1.8 L    Gustavus Bryant, MS, RD, LDN Inpatient Clinical Dietitian Please see AMiON for contact information.

## 2021-05-30 NOTE — Progress Notes (Signed)
[facility]  ROUNDING NOTE   Subjective:   [name] is a 87 y.o. male with past medical history including hypertension, proteinuria, ischemic cardiomyopathy, bilaternal carotid stenosis, and CKD stage 4. He has presented to ED after his nephrology appt with concerns of prolonged aspiration pneumonia. He has been admitted to [facility] for Pleural effusion [J90] AKI (acute kidney injury) ([place]) [N17.9] Pneumonia due to infectious organism, unspecified laterality, unspecified part of lung [J18.9]   Patient is followed by our clinic, under the supervision of Dr Holley Raring. He reports drinking 3-16oz bottles of water daily. He continues to take medications prescribed, including diuretic. He denies nausea, vomiting and diarrhea. He reports shortness of breath with activity. Denies cough. States he is able to lay flat in bed, using 1 pillow for his head. Denies change in appetite, but prolonging swallowing difficulties. Patient seen sitting up in chair. States he wears oxygen at night and intermittently during the day. Reports no swelling to his knowledge.   Objective:  Vital signs in last 24 hours:  Temp:  [97.6 F (36.4 C)-97.9 F (36.6 C)] 97.6 F (36.4 C) (09/13 0737) Pulse Rate:  [55-67] 57 (09/13 0737) Resp:  [12-20] 17 (09/13 0737) BP: (107-129)/(67-87) 107/67 (09/13 0737) SpO2:  [96 %-100 %] 97 % (09/13 0737) Weight:  [73 kg] 73 kg (09/12 1736)  Weight change:  Filed Weights   05/29/21 1736  Weight: 73 kg    Intake/Output: I/O last 3 completed shifts: In: 0  Out: 800 [Urine:800]   Intake/Output this shift:  No intake/output data recorded.  Physical Exam: General: NAD, sitting in chair  Head: Normocephalic, atraumatic. Moist oral mucosal membranes  Eyes: Anicteric  Lungs:  Clear to auscultation, [place] O2  Heart: Regular rate and rhythm  Abdomen:  Soft, nontender  Extremities:  1+ peripheral edema.  Neurologic: Nonfocal, moving all four extremities  Skin: No  lesions       Basic Metabolic Panel: Recent Labs  Lab 05/29/21 1736 05/30/21 0140  NA 134* 135  K 3.7 3.6  CL 98 99  CO2 22 23  GLUCOSE 104* 99  BUN 110* 110*  CREATININE 4.01* 3.88*  CALCIUM 8.7* 8.6*    Liver Function Tests: No results for input(s): AST, ALT, ALKPHOS, BILITOT, PROT, ALBUMIN in the last 168 hours. No results for input(s): LIPASE, AMYLASE in the last 168 hours. No results for input(s): AMMONIA in the last 168 hours.  CBC: Recent Labs  Lab 05/29/21 1736 05/30/21 0140  WBC 6.5 5.7  HGB 11.4* 11.2*  HCT 33.7* 32.8*  MCV 92.8 92.9  PLT 122* 111*    Cardiac Enzymes: No results for input(s): CKTOTAL, CKMB, CKMBINDEX, TROPONINI in the last 168 hours.  BNP: Invalid input(s): POCBNP  CBG: No results for input(s): GLUCAP in the last 168 hours.  Microbiology: Results for orders placed or performed during the hospital encounter of 05/29/21  Resp Panel by RT-PCR (Flu A&B, Covid) Nasopharyngeal Swab     Status: None   Collection Time: 05/29/21  8:14 PM   Specimen: Nasopharyngeal Swab; Nasopharyngeal(NP) swabs in vial transport medium  Result Value Ref Range Status   SARS Coronavirus 2 by RT PCR NEGATIVE NEGATIVE Final    Comment: (NOTE) SARS-CoV-2 target nucleic acids are NOT DETECTED.  The SARS-CoV-2 RNA is generally detectable in upper respiratory specimens during the acute phase of infection. The lowest concentration of SARS-CoV-2 viral copies this assay can detect is 138 copies/mL. A negative result does not preclude SARS-Cov-2 infection and should  not be used as the sole basis for treatment or other patient management decisions. A negative result may occur with  improper specimen collection/handling, submission of specimen other than nasopharyngeal swab, presence of viral mutation(s) within the areas targeted by this assay, and inadequate number of viral copies(<138 copies/mL). A negative result must be combined with clinical observations,  patient history, and epidemiological information. The expected result is Negative.  Fact Sheet for Patients:  EntrepreneurPulse.com.au  Fact Sheet for Healthcare Providers:  IncredibleEmployment.be  This test is no t yet approved or cleared by the [place] FDA and  has been authorized for detection and/or diagnosis of SARS-CoV-2 by FDA under an Emergency Use Authorization (EUA). This EUA will remain  in effect (meaning this test can be used) for the duration of the COVID-19 declaration under Section 564(b)(1) of the Act, 21 U.S.C.section 360bbb-3(b)(1), unless the authorization is terminated  or revoked sooner.       Influenza A by PCR NEGATIVE NEGATIVE Final   Influenza B by PCR NEGATIVE NEGATIVE Final    Comment: (NOTE) The Xpert Xpress SARS-CoV-2/FLU/RSV plus assay is intended as an aid in the diagnosis of influenza from Nasopharyngeal swab specimens and should not be used as a sole basis for treatment. Nasal washings and aspirates are unacceptable for Xpert Xpress SARS-CoV-2/FLU/RSV testing.  Fact Sheet for Patients: EntrepreneurPulse.com.au  Fact Sheet for Healthcare Providers: IncredibleEmployment.be  This test is not yet approved or cleared by the [place] FDA and has been authorized for detection and/or diagnosis of SARS-CoV-2 by FDA under an Emergency Use Authorization (EUA). This EUA will remain in effect (meaning this test can be used) for the duration of the COVID-19 declaration under Section 564(b)(1) of the Act, 21 U.S.C. section 360bbb-3(b)(1), unless the authorization is terminated or revoked.  Performed at [facility], [address] [place], [place] [address]     Coagulation Studies: No results for input(s): LABPROT, INR in the last 72 hours.  Urinalysis: Recent Labs    05/30/21 0600  COLORURINE STRAW*  LABSPEC 1.006  PHURINE 6.0  GLUCOSEU NEGATIVE   HGBUR MODERATE*  BILIRUBINUR NEGATIVE  KETONESUR NEGATIVE  PROTEINUR NEGATIVE  NITRITE NEGATIVE  LEUKOCYTESUR NEGATIVE      Imaging: DG Chest 2 View  Result Date: 05/29/2021 CLINICAL DATA:  Shortness of breath, pneumonia EXAM: CHEST - 2 VIEW COMPARISON:  02/07/2021 FINDINGS: Gross cardiomegaly status post median sternotomy and CABG with aortic valve prosthesis. Small, loculated appearing bilateral pleural effusions and associated atelectasis or consolidation, similar in appearance to prior examination. Mild, diffuse interstitial pulmonary opacity and pulmonary vascular prominence. IMPRESSION: 1. Small, loculated appearing bilateral pleural effusions and associated atelectasis or consolidation, similar in appearance to prior examination. 2. Mild, diffuse interstitial pulmonary opacity and pulmonary vascular prominence, likely mild edema in the setting of cardiomegaly and similar to prior. No focal airspace opacity. Electronically Signed   By: Eddie Candle M.D.   On: 05/29/2021 18:26     Medications:     amiodarone  200 mg Oral Daily   aspirin EC  81 mg Oral Daily   fluticasone  2 spray Each Nare Daily   furosemide  60 mg Intravenous Daily   heparin  5,000 Units Subcutaneous Q8H   potassium chloride SA  40 mEq Oral Daily   pravastatin  20 mg Oral QPM   acetaminophen **OR** acetaminophen, albuterol, ALPRAZolam, melatonin, ondansetron **OR** ondansetron (ZOFRAN) IV  Assessment/ Plan:  [name] is a 87 y.o.  male with past medical  history including hypertension, proteinuria, ischemic cardiomyopathy, bilaternal carotid stenosis, and CKD stage 4. He has presented to ED after his nephrology appt with concerns of prolonged aspiration pneumonia. He has been admitted to [facility] for Pleural effusion [J90] AKI (acute kidney injury) ([place]) [N17.9] Pneumonia due to infectious organism, unspecified laterality, unspecified part of lung [J18.9]   Acute Kidney Injury with hyponatremia on  chronic kidney disease stage 4 with baseline creatinine 2.71 and GFR of 22 on 03/09/21.  Acute kidney injury secondary to cardiorenal syndrome  No IV contrast exposure. Losartan held Creatinine currently 3.88 and eGFR 14. No indication for dialysis at this time. Sodium improved to 135 today. Will monitor closely  Lab Results  Component Value Date   CREATININE 3.88 (H) 05/30/2021   CREATININE 4.01 (H) 05/29/2021   CREATININE 2.71 (H) 03/09/2021    Intake/Output Summary (Last 24 hours) at 05/30/2021 1244 Last data filed at 05/30/2021 0500 Gross per 24 hour  Intake 0 ml  Output 800 ml  Net -800 ml    2.Systolic heart failure, home regimen includes Torsemide 52m daily ECHO on 01/31/21 shows EF <20. Will give IV Furosemide 651mdaily.  3. Hypertension Home regimen includes Losartan, amiodarone and torsemide. Losartan held. BP 107/67  4. Anemia of chronic kidney disease Lab Results  Component Value Date   HGB 11.2 (L) 05/30/2021   Hgb at goal   LOS: 0 Delaynee Alred 9/13/202212:44 PM

## 2021-05-30 NOTE — Evaluation (Signed)
Occupational Therapy Evaluation Patient Details Name: [name] MRN: [id number] DOB: July 16, 1931 Today's Date: 05/30/2021   History of Present Illness Akshith Moncus is an 66yoM who comes to [facility] on 9/12 c difficulty swallowing.   PMH: CKD4, HTN, proteinuria, hyperparathyroidism due to renal disease, HLD, aspiration PNA, MGUS, HF c EF: 20%, CABG, ICM, carotid stenosis, AS, paroxysmal SVT. PTA pt lives alone, household AMB with RW, independent in ADL, Son/DTR assist with groceries.   Clinical Impression   Pt seen for OT evaluation this date. Pt was modified independent in basic ADL, ADL mobility, and IADL tasks such as laundry, meal prep, and light home exercise. Pt lives by himself since his spouse passed away ~16 yrs ago in a 1 story home with level entry. Pt reports using O2 at night and PRN. Pt also endorses monitoring daily weights and verbalizes his routine for documenting along with parameters for calling the MD. Pt denies weight gain or swelling prior to admission. Pt does endorse some mild fatigue with exertion. Pt currently near baseline. Pt instructed in ECS including positioning, home/routines modifications, and strategies to support breath recovery and gradual return to routines including walking the driveway for exercise, lifting 5lb weights, and ADL/IADL. Pt verbalized understanding and politely declined additional OT needs at this time. Pt would benefit from portable O2 concentrator to help maximize the pt's independence at home and in the community, maximize safety, and maximize quality of life. TOC notified. Will sign off. Please re-consult if additional needs arise.     Recommendations for follow up therapy are one component of a multi-disciplinary discharge planning process, led by the attending physician.  Recommendations may be updated based on patient status, additional functional criteria and insurance authorization.   Follow Up Recommendations  No OT follow up;Supervision -  Intermittent    Equipment Recommendations  Other (comment) (Pt would benefit from portable O2 concentrator to maximize independence and quality of life)    Recommendations for Other Services       Precautions / Restrictions Precautions Precautions: Fall Precaution Comments: needs shoes for AMB due to LLD Restrictions Weight Bearing Restrictions: No      Mobility Bed Mobility Overal bed mobility: Modified Independent                  Transfers Overall transfer level: Needs assistance Equipment used: Rolling walker (2 wheeled) Transfers: Sit to/from Stand Sit to Stand: Supervision         General transfer comment: Pryor Curia assists with lines/leads    Balance Overall balance assessment: Modified Independent;No apparent balance deficits (not formally assessed)                                         ADL either performed or assessed with clinical judgement   ADL Overall ADL's : Modified independent                                       General ADL Comments: Pt able to demo mod indep with basic ADL, no overt difficulty. SpO2 96% on 2L O2. Denied SOB.     Vision         Perception     Praxis      Pertinent Vitals/Pain Pain Assessment: No/denies pain     Hand Dominance Right   Extremity/Trunk  Assessment Upper Extremity Assessment Upper Extremity Assessment: Overall WFL for tasks assessed (grossly are appropriate [facility])   Lower Extremity Assessment Lower Extremity Assessment: Generalized weakness;Overall [facility] for tasks assessed   Cervical / Trunk Assessment Cervical / Trunk Assessment: Normal   Communication Communication Communication: No difficulties   Cognition Arousal/Alertness: Awake/alert Behavior During Therapy: WFL for tasks assessed/performed Overall Cognitive Status: Within Functional Limits for tasks assessed                                     General Comments       Exercises Other  Exercises Other Exercises: Pt instructed in ECS including positioning, home/routines modifications, and strategies to support breath recovery and gradual return to routines including walking the driveway for exercise, lifting 5lb weights, and ADL/IADL.   Shoulder Instructions      Home Living Family/patient expects to be discharged to:: Private residence Living Arrangements: Alone Available Help at Discharge: Family;Available PRN/intermittently;Neighbor Type of Home: House Home Access: Level entry     Home Layout: One level     Bathroom Shower/Tub: Occupational psychologist: Standard Bathroom Accessibility: Yes How Accessible: Accessible via walker Home Equipment: [place] - 2 wheels;Shower seat;Walker - 4 wheels;Grab bars - tub/shower;Hand held shower head;Other (comment)   Additional Comments: Lift Chair      Prior Functioning/Environment Level of Independence: Needs assistance  Gait / Transfers Assistance Needed: Pt reports RW use at home ADL's / Homemaking Assistance Needed: Pt reports mod indep with basic ADL, meal prep, and TED hose. Son/daughter provide groceries as pt no longer drives.            OT Problem List: Decreased activity tolerance;Decreased strength      OT Treatment/Interventions:      OT Goals(Current goals can be found in the care plan section) Acute Rehab OT Goals Patient Stated Goal: be less SOB with mobility OT Goal Formulation: All assessment and education complete, DC therapy  OT Frequency:     Barriers to D/C:            Co-evaluation              AM-PAC OT "6 Clicks" Daily Activity     Outcome Measure Help from another person eating meals?: None Help from another person taking care of personal grooming?: None Help from another person toileting, which includes using toliet, bedpan, or urinal?: A Little Help from another person bathing (including washing, rinsing, drying)?: None Help from another person to put on and  taking off regular upper body clothing?: None Help from another person to put on and taking off regular lower body clothing?: None 6 Click Score: 23   End of Session    Activity Tolerance: Patient tolerated treatment well Patient left: in chair;with call bell/phone within reach;with chair alarm set  OT Visit Diagnosis: Other abnormalities of gait and mobility (R26.89);Muscle weakness (generalized) (M62.81)                Time: 6160-7371 OT Time Calculation (min): 18 min Charges:  OT General Charges $OT Visit: 1 Visit OT Evaluation $OT Eval Low Complexity: 1 Low OT Treatments $Self Care/Home Management : 8-22 mins  Ardeth Perfect., MPH, MS, OTR/L ascom [phone] 05/30/21, 2:41 PM

## 2021-05-30 NOTE — Progress Notes (Addendum)
[facility] NAME: [name]    MR#:  [id number]  DATE OF BIRTH:  Aug 04, 1931  SUBJECTIVE:  Doing well patient breathing much comfortably. Denies any cough with phlegm or fever. He does have leg swelling. No family at bedside.  REVIEW OF SYSTEMS:   Review of Systems  Constitutional:  Negative for chills, fever and weight loss.  HENT:  Negative for ear discharge, ear pain and nosebleeds.   Eyes:  Negative for blurred vision, pain and discharge.  Respiratory:  Positive for shortness of breath. Negative for sputum production, wheezing and stridor.   Cardiovascular:  Positive for leg swelling. Negative for chest pain, palpitations, orthopnea and PND.  Gastrointestinal:  Negative for abdominal pain, diarrhea, nausea and vomiting.  Genitourinary:  Negative for frequency and urgency.  Musculoskeletal:  Negative for back pain and joint pain.  Neurological:  Negative for sensory change, speech change, focal weakness and weakness.  Psychiatric/Behavioral:  Negative for depression and hallucinations. The patient is not nervous/anxious.   Tolerating Diet:yes Tolerating PT: No PT needs  DRUG ALLERGIES:   Allergies  Allergen Reactions   Morphine Anaphylaxis   Oxycodone Other (See Comments)    Hypotension and bradycardia   Carvedilol     Dizziness and syncope    Iodinated Diagnostic Agents Rash    Other reaction(s): Asthenia (finding), Other (qualifier value) paralyzed legs Other reaction(s): RASH    Lisinopril Cough and Other (See Comments)    VITALS:  Blood pressure 107/67, pulse (!) 57, temperature 97.6 F (36.4 C), resp. rate 17, height 5\' 11"  (1.803 m), weight 73 kg, SpO2 97 %.  PHYSICAL EXAMINATION:   Physical Exam  GENERAL:  87 y.o.-year-old patient lying in the bed with no acute distress.  LUNGS:decreased breath sounds bilaterally, no wheezing, rales, rhonchi. No use of accessory muscles of respiration.   CARDIOVASCULAR: S1, S2 normal. No murmurs, rubs, or gallops.  ABDOMEN: Soft, nontender, nondistended. Bowel sounds present. No organomegaly or mass.  EXTREMITIES: ++ edema b/l.    NEUROLOGIC: Cranial nerves II through XII are intact. No focal Motor or sensory deficits b/l.   PSYCHIATRIC:  patient is alert and oriented x 2  SKIN: No obvious rash, lesion, or ulcer.   LABORATORY PANEL:  CBC Recent Labs  Lab 05/30/21 0140  WBC 5.7  HGB 11.2*  HCT 32.8*  PLT 111*    Chemistries  Recent Labs  Lab 05/30/21 0140  NA 135  K 3.6  CL 99  CO2 23  GLUCOSE 99  BUN 110*  CREATININE 3.88*  CALCIUM 8.6*   Cardiac Enzymes No results for input(s): TROPONINI in the last 168 hours. RADIOLOGY:  DG Chest 2 View  Result Date: 05/29/2021 CLINICAL DATA:  Shortness of breath, pneumonia EXAM: CHEST - 2 VIEW COMPARISON:  02/07/2021 FINDINGS: Gross cardiomegaly status post median sternotomy and CABG with aortic valve prosthesis. Small, loculated appearing bilateral pleural effusions and associated atelectasis or consolidation, similar in appearance to prior examination. Mild, diffuse interstitial pulmonary opacity and pulmonary vascular prominence. IMPRESSION: 1. Small, loculated appearing bilateral pleural effusions and associated atelectasis or consolidation, similar in appearance to prior examination. 2. Mild, diffuse interstitial pulmonary opacity and pulmonary vascular prominence, likely mild edema in the setting of cardiomegaly and similar to prior. No focal airspace opacity. Electronically Signed   By: Eddie Candle M.D.   On: 05/29/2021 18:26   ASSESSMENT AND PLAN:  : [name] is a 87 y.o. male with medical history  significant for CKD stage IV, hypertension, proteinuria, hyperparathyroidism due to renal insufficiency, hyperlipidemia, history of aspiration pneumonia treated outpatient, heart failure exacerbation, ischemic cardiomyopathy, bilateral carotid stenosis, severe aortic stenosis,  paroxysmal SVT, EF of 20%, history of CABG, status post LIMA to LAD, SVG to OM1, OM2, D1, PDA, status post bioprosthetic valve replacement in 2011, who presents to the emergency department with possibility of PNA  acute on chronic kidney disease stage IV presume secondary to cardio renal in the setting of pulmonary edema/bilateral pleural effusion -- patient noted to have elevated BNP and shortness of breath. -- Clinically does not seem to have pneumonia. He recently completed round of antibiotic -- no fever, no cough with flame, white count normal, Pro calcitonin less than 0.1 -- will hold off antibiotic -- discussed with Dr. Juleen China continue IV Lasix daily monitor input output and creatinine  difficulty swallowing/dysphagia -- see by speech therapy continue current diet. May require modified barium study if patient continues to have symptoms -- speech therapy recommends G.I. referral as outpatient if symptoms continue  CAD -- continue aspirin and pravastatin  acute on chronic congestive heart failure, systolic -- continue IV diuresis with Lasix as above -- continue amiodarone -- patient uses oxygen at nighttime and as needed daytime  history of MG [name]  Hyperlipidemia on statins  Pt was seen by physical therapy no PT follow-up or home meals recommended  Procedures: Family communication :dter [name] Consults :nephrology CODE STATUS: Partial DVT Prophylaxis :heparin Level of care: Med-Surg Status is: Inpatient  Remains inpatient appropriate because:Inpatient level of care appropriate due to severity of illness  Dispo: The patient is from: Home              Anticipated d/c is to: Home              Patient currently is not medically stable to d/c.   Difficult to place patient No     TOTAL TIME TAKING CARE OF THIS PATIENT: 25 minutes.  >50% time spent on counselling and coordination of care  Note: This dictation was prepared with Dragon dictation along with smaller phrase  technology. Any transcriptional errors that result from this process are unintentional.  Fritzi Mandes M.D    [facility]   CC: Primary care physician; Baxter Hire, MD Patient ID: [name], male   DOB: [birth year], 87 y.o.   MRN: [id number]

## 2021-05-30 NOTE — Progress Notes (Signed)
Clinical/Bedside Swallow Evaluation Patient Details  Name: [name] MRN: [id number] Date of Birth: [birth year]  Today's Date: 05/30/2021 Time: SLP Start Time (ACUTE ONLY): 0900 SLP Stop Time (ACUTE ONLY): 0930 SLP Time Calculation (min) (ACUTE ONLY): 30 min  Past Medical History:  Past Medical History:  Diagnosis Date   Anemia    Aortic aneurysm (HCC)    Aortic aneurysm (HCC)    Arthritis    CHF (congestive heart failure) (HCC)    CKD (chronic kidney disease)    ALSO LEFT RENAL MASS   Coronary artery disease    Diverticulosis    WITH RUPTURE   GERD (gastroesophageal reflux disease)    History of hiatal hernia    Hypercholesteremia    Hypertension    MGUS (monoclonal gammopathy of unknown significance)    MGUS (monoclonal gammopathy of unknown significance)    Past Surgical History:  Past Surgical History:  Procedure Laterality Date   AORTIC VALVE REPLACEMENT     bipass     CARDIAC VALVE REPLACEMENT     pig valve   CHOLECYSTECTOMY     COLON SURGERY     CORONARY ANGIOPLASTY     CORONARY ARTERY BYPASS GRAFT     ESOPHAGOGASTRODUODENOSCOPY (EGD) WITH PROPOFOL N/A 02/07/2015   Procedure: ESOPHAGOGASTRODUODENOSCOPY (EGD) WITH PROPOFOL;  Surgeon: Hulen Luster, MD;  Location: [facility] ENDOSCOPY;  Service: Gastroenterology;  Laterality: N/A;   EYE SURGERY     HERNIA REPAIR     INTRAMEDULLARY (IM) NAIL INTERTROCHANTERIC Right 05/12/2020   Procedure: INTRAMEDULLARY (IM) NAIL INTERTROCHANTRIC;  Surgeon: Lovell Sheehan, MD;  Location: [facility] ORS;  Service: Orthopedics;  Laterality: Right;   JOINT REPLACEMENT     left knee x4   KNEE ARTHROPLASTY Right 04/02/2016   Procedure: COMPUTER ASSISTED TOTAL KNEE ARTHROPLASTY;  Surgeon: Dereck Leep, MD;  Location: [facility] ORS;  Service: Orthopedics;  Laterality: Right;   ORIF FEMUR FRACTURE Right 08/08/2020   Procedure: OPEN REDUCTION INTERNAL FIXATION (ORIF) DISTAL FEMUR FRACTURE;  Surgeon: Lovell Sheehan, MD;  Location: [facility] ORS;  Service:  Orthopedics;  Laterality: Right;   HPI:  Patient is an 87 year-old-male with past medical history noted for CHF, CKD stage IV, HTN, proteinuria, hyperparathyroidism due to renal insufficiency, HLD,  ischemic cardiomyopathy, CAD s/p CABG, and GERD. Was being treated for aspiration PNA as an outpatient; reports his pulmonologist (seen on 05/25/21) planned to refer him for swallow study with SLP but this has not been completed. Patient denies difficulty swallowing but reports globus sensation, early satiety and "tasting" his food long after he has eaten. CT chest on 05/19/21 showed "high attenuation material noted in the collapsed lung parenchyma". This admission CXR on 05/29/21 showed: "bilateral pleural effusions and associated atelectasis or consolidation" and "mild, diffuse, interstitial pulmonary opacity and pulmonary vascular prominence, likely mild edema in the setting of cardiomegaly."   Assessment / Plan / Recommendation Clinical Impression  Patient presents with oropharyngeal swallow which appears at bedside to be within functional limits with adequate airway protection. Oral motor examination is unremarkable. Patient is alert, oriented, following commands and able to self-feed without difficulty. Oral stage is characterized by appearance of adequate and timely bolus preparation and oral transfer. Patient consumed regular solids, thin liquids, and puree with no overt signs of aspiration observed despite challenging with consecutive straw sips of thin liquids in excess of 3oz. Patient denies difficulties with swallowing but reports early satiety and "tasting" his food sometimes hours after eating. He prefers softer foods. Recommend dysphagia 3 (  mechanical soft) diet and thin liquids, meds whole with liquids, with general aspiration/esophageal precautions. Upright during and 30 minutes after meals, slow rate, small bites and sips. Consider GI consult given history of GERD and pt complaints, which appear more  consistent with esophageal vs oropharyngeal dysphagia. Will follow briefly for diet tolerance.   SLP Visit Diagnosis: Dysphagia, oropharyngeal phase (R13.12)    Aspiration Risk  Mild aspiration risk    Diet Recommendation Dysphagia 3 (Mech soft);Thin liquid   Liquid Administration via: Cup;Straw Medication Administration: Whole meds with liquid Supervision: Patient able to self feed Compensations: Slow rate;Small sips/bites Postural Changes: Seated upright at 90 degrees;Remain upright for at least 30 minutes after po intake    Other  Recommendations Recommended Consults: Consider GI evaluation;Consider esophageal assessment Oral Care Recommendations: Oral care BID   Follow up Recommendations None      Frequency and Duration min 1 x/week  1 week       Prognosis Prognosis for Safe Diet Advancement: Fair (dys 3 is his baseline diet (soft foods, per pt))      Swallow Study   General Date of Onset: 05/29/21 HPI: Patient is an 87 year-old-male with past medical history noted for CHF, CKD stage IV, HTN, proteinuria, hyperparathyroidism due to renal insufficiency, HLD,  ischemic cardiomyopathy, CAD s/p CABG, and GERD. Was being treated for aspiration PNA as an outpatient; reports his pulmonologist (seen on 05/25/21) planned to refer him for swallow study with SLP but this has not been completed. Patient denies difficulty swallowing but reports globus sensation, early satiety and "tasting" his food long after he has eaten. CT chest on 05/19/21 showed "high attenuation material noted in the collapsed lung parenchyma". This admission CXR on 05/29/21 showed: "bilateral pleural effusions and associated atelectasis or consolidation" and "mild, diffuse, interstitial pulmonary opacity and pulmonary vascular prominence, likely mild edema in the setting of cardiomegaly." Type of Study: Bedside Swallow Evaluation Previous Swallow Assessment: none on file; his MD recommended SLP consult but this was not set  up Diet Prior to this Study: NPO Temperature Spikes Noted: No Respiratory Status: Nasal cannula History of Recent Intubation: No Behavior/Cognition: Alert;Cooperative;Pleasant mood Oral Cavity Assessment: Within Functional Limits Oral Care Completed by SLP: No Oral Cavity - Dentition: Dentures, top Vision: Functional for self-feeding Self-Feeding Abilities: Able to feed self Patient Positioning: Upright in bed Baseline Vocal Quality: Normal Volitional Cough: Strong Volitional Swallow: Able to elicit    Oral/Motor/Sensory Function Overall Oral Motor/Sensory Function: Within functional limits   Ice Chips Ice chips: Within functional limits Presentation: Spoon   Thin Liquid Thin Liquid: Within functional limits Presentation: Cup;Straw    Nectar Thick Nectar Thick Liquid: Not tested   Honey Thick Honey Thick Liquid: Not tested   Puree Puree: Within functional limits Presentation: Self Fed;Spoon   Solid     Solid: Within functional limits Presentation: [place], [place], [place] Speech-Language Pathologist  Aliene Altes 05/30/2021,9:54 AM

## 2021-05-30 NOTE — Progress Notes (Signed)
   Heart Failure Nurse Navigator Note  Met with patient today, he is sitting up in the chair at bedside in no acute distress.  He denies any worsening shortness of breath.  He states prior to admission he had noted that weight had gotten up to 165 pounds but the day of admission he was down to 161 pounds.  He denied any dietary indiscretions or drinking over his 3 bottles of water daily.  Also discussed the documentation for the lymphedema boots.  He showed me the email that he had received from the company stating that he had submitted the wrong birthdate.  I helped him with resubmitting the documentation with the corrected birthdate and his signature.  Also discussed with him following up in the heart failure clinic after discharge.  Instructed that I am available if he has any further question and would check on him daily as long as he is an inpatient.  Pricilla Riffle RN [facility]

## 2021-05-30 NOTE — Evaluation (Signed)
Physical Therapy Evaluation Patient Details Name: [name] MRN: [id number] DOB: 1931-02-26 Today's Date: 05/30/2021  History of Present Illness  [name] is an 82yoM who comes to [facility] on 9/12 c difficulty swallowing.   PMH: CKD4, HTN, proteinuria, hyperparathyroidism due to renal disease, HLD, aspiration PNA, MGUS, HF c EF: 20%, CABG, ICM, carotid stenosis, AS, paroxysmal SVT. PTA pt lives alone, household AMB with RW, independent in ADL, Son/DTR assist with groceries.  Clinical Impression  Pt admitted with above diagnosis. Pt currently with functional limitations due to the deficits listed below (see "PT Problem List"). Upon entry, pt in bed, awake and agreeable to participate. The pt is alert, pleasant, interactive, and able to provide info regarding prior level of function, both in tolerance and independence. No physical assist needed for bed mobility or transfers. Author provides minGuardA for AMB in hallway, trial on room air as pt typical wears O2 at night, occasionally during day. Pt's AMB distance limited significantly compared to baseline, desats to 81% on room air. Pt reports significant burden of O2 cylinder at home limiting his ability to AMB outside of the home, go to restaurants, visit with friends. Author reaches out to [facility] regarding pt's ability to obtain a Marine scientist. Patient's performance this date reveals decreased ability, independence, and tolerance in performing all basic mobility required for performance of activities of daily living. Pt requires additional DME, close physical assistance, and cues for safe participate in mobility. Pt will benefit from skilled PT intervention to increase independence and safety with basic mobility in preparation for discharge to the venue listed below.        Recommendations for follow up therapy are one component of a multi-disciplinary discharge planning process, led by the attending physician.  Recommendations may be updated  based on patient status, additional functional criteria and insurance authorization.  Follow Up Recommendations Supervision - Intermittent;No PT follow up    Equipment Recommendations  Other (comment) (portable O2 concentrator; cannot manage his cylinder and RW at the same time, has remained home bound mostly)    Recommendations for Other Services       Precautions / Restrictions Precautions Precautions: Fall Precaution Comments: needs shoes for AMB due to LLD Restrictions Weight Bearing Restrictions: No      Mobility  Bed Mobility Overal bed mobility: Modified Independent                  Transfers Overall transfer level: Needs assistance Equipment used: Rolling walker (2 wheeled) Transfers: Sit to/from Stand Sit to Stand: Supervision;Min guard         General transfer comment: Pryor Curia assists with lines/leads  Ambulation/Gait Ambulation/Gait assistance: Min guard Gait Distance (Feet): 140 Feet Assistive device: Rolling walker (2 wheeled) Gait Pattern/deviations: WFL(Within Functional Limits)     General Gait Details: DOE halfway and at end; on Room air for trial, terminal sats at 81% SpO2.  Stairs            Wheelchair Mobility    Modified Rankin (Stroke Patients Only)       Balance Overall balance assessment: Modified Independent;No apparent balance deficits (not formally assessed)                                           Pertinent Vitals/Pain Pain Assessment: No/denies pain    Home Living  Prior Function                 Hand Dominance        Extremity/Trunk Assessment   Upper Extremity Assessment Upper Extremity Assessment: Generalized weakness;Overall [facility] for tasks assessed    Lower Extremity Assessment Lower Extremity Assessment: Generalized weakness;Overall [facility] for tasks assessed    Cervical / Trunk Assessment Cervical / Trunk Assessment: Normal  Communication       Cognition Arousal/Alertness: Awake/alert Behavior During Therapy: WFL for tasks assessed/performed Overall Cognitive Status: Within Functional Limits for tasks assessed                                        General Comments      Exercises     Assessment/Plan    PT Assessment Patient needs continued PT services  PT Problem List Decreased strength;Decreased range of motion;Decreased activity tolerance;Decreased balance;Decreased mobility;Decreased knowledge of precautions;Decreased knowledge of use of DME;Cardiopulmonary status limiting activity       PT Treatment Interventions DME instruction;Balance training;Gait training;Stair training;Functional mobility training;Therapeutic activities;Therapeutic exercise;Patient/family education    PT Goals (Current goals can be found in the Care Plan section)  Acute Rehab PT Goals Patient Stated Goal: be less SOB with mobility PT Goal Formulation: With patient Time For Goal Achievement: 06/13/21 Potential to Achieve Goals: Fair    Frequency Min 2X/week   Barriers to discharge        Co-evaluation               AM-PAC PT "6 Clicks" Mobility  Outcome Measure Help needed turning from your back to your side while in a flat bed without using bedrails?: None Help needed moving from lying on your back to sitting on the side of a flat bed without using bedrails?: None Help needed moving to and from a bed to a chair (including a wheelchair)?: A Little Help needed standing up from a chair using your arms (e.g., wheelchair or bedside chair)?: A Little Help needed to walk in hospital room?: A Little Help needed climbing 3-5 steps with a railing? : A Lot 6 Click Score: 19    End of Session Equipment Utilized During Treatment: Gait belt;Oxygen Activity Tolerance: Patient limited by fatigue;Patient tolerated treatment well Patient left: in chair;with call bell/phone within reach Nurse Communication: Mobility  status PT Visit Diagnosis: Difficulty in walking, not elsewhere classified (R26.2);Other symptoms and signs involving the nervous system (R29.898);Other abnormalities of gait and mobility (R26.89)    Time: 0940-7680 PT Time Calculation (min) (ACUTE ONLY): 28 min   Charges:   PT Evaluation $PT Eval Low Complexity: 1 Low PT Treatments $Therapeutic Activity: 8-22 mins       12:18 PM, 05/30/21 Etta Grandchild, PT, DPT Physical Therapist - [facility]  [phone] ([place])    Karina Lenderman C 05/30/2021, 11:36 AM

## 2021-05-31 ENCOUNTER — Encounter: Payer: Self-pay | Admitting: Internal Medicine

## 2021-05-31 ENCOUNTER — Inpatient Hospital Stay: Payer: PPO

## 2021-05-31 LAB — RENAL FUNCTION PANEL
Albumin: 3.3 g/dL — ABNORMAL LOW (ref 3.5–5.0)
Anion gap: 12 (ref 5–15)
BUN: 95 mg/dL — ABNORMAL HIGH (ref 8–23)
CO2: 24 mmol/L (ref 22–32)
Calcium: 8.7 mg/dL — ABNORMAL LOW (ref 8.9–10.3)
Chloride: 99 mmol/L (ref 98–111)
Creatinine, Ser: 3.75 mg/dL — ABNORMAL HIGH (ref 0.61–1.24)
GFR, Estimated: 15 mL/min — ABNORMAL LOW (ref >60)
Glucose, Bld: 105 mg/dL — ABNORMAL HIGH (ref 70–99)
Phosphorus: 5 mg/dL — ABNORMAL HIGH (ref 2.5–4.6)
Potassium: 3.8 mmol/L (ref 3.5–5.1)
Sodium: 135 mmol/L (ref 135–145)

## 2021-05-31 LAB — CBC
HCT: 32 % — ABNORMAL LOW (ref 39.0–52.0)
Hemoglobin: 11.2 g/dL — ABNORMAL LOW (ref 13.0–17.0)
MCH: 32.7 pg (ref 26.0–34.0)
MCHC: 35 g/dL (ref 30.0–36.0)
MCV: 93.6 fL (ref 80.0–100.0)
Platelets: 118 10*3/uL — ABNORMAL LOW (ref 150–400)
RBC: 3.42 MIL/uL — ABNORMAL LOW (ref 4.22–5.81)
RDW: 21.7 % — ABNORMAL HIGH (ref 11.5–15.5)
WBC: 6.2 10*3/uL (ref 4.0–10.5)
nRBC: 0 % (ref 0.0–0.2)

## 2021-05-31 MED ORDER — TORSEMIDE 20 MG PO TABS
20.0000 mg | ORAL_TABLET | Freq: Every day | ORAL | Status: DC
  Administered 2021-05-31 – 2021-06-01 (×2): 20 mg via ORAL
  Filled 2021-05-31 (×2): qty 1

## 2021-05-31 NOTE — Progress Notes (Addendum)
  Speech Language Pathology Treatment: Dysphagia  Patient Details Name: [name] MRN: [id number] DOB: 1931/01/03 Today's Date: 05/31/2021 Time: 6222-9798 SLP Time Calculation (min) (ACUTE ONLY): 30 min  Assessment / Plan / Recommendation Clinical Impression  Pt seen today for ongoing education w/ general aspiration precautions; toleration of current diet. Pt also given education and recommendations for f/u w/ GI post Discharge d/t his frank s/s of Esophageal Dysmotility and REFLUX. Education and handouts given on REFLUX, precautions. MD also contacted for recommendation of GI f/u and potential PPI d/t pt's c/o REFLUX activity. DG Esophagus was ordered to assess any Esophageal Dysmotility as this can increase risk for aspiration of REFLUX material.   Pt assisted w/ positioning for midline sitting in chair. He consulted Thin liquids via straw. NO overt clinical s/s of aspiration were noted w/ any consistency; respiratory status remained calm and unlabored, vocal quality clear b/t trials. Oral phase appeared [facility] for bolus management and timely A-P transfer for swallowing; oral clearing achieved w/ all consistencies. NSG denied any deficits in swallowing as well.   Pt appears at reduced risk for aspriation when following general aspiration precautions. Recommend continue a Regular/mech soft diet for ease of soft foods w/ gravies added to moisten foods; Thin liquids. Recommend general aspiration precautions; Pills Whole in Puree; tray setup and positioning assistance for meals. REFLUX precautions d/t pt's baseline. ST services will sign off at this time w/ MD to reconsult if needed while admitted. NSG updated. Precautions posted at bedside.     HPI HPI: Patient is an 87 year-old-male with past medical history noted for CHF, CKD stage IV, HTN, proteinuria, hyperparathyroidism due to renal insufficiency, HLD,  ischemic cardiomyopathy, CAD s/p CABG, and GERD. Was being treated for aspiration PNA as an  outpatient; reports his pulmonologist (seen on 05/25/21) planned to refer him for swallow study with SLP but this has not been completed. Patient denies difficulty swallowing but reports globus sensation, early satiety and "tasting" his food long after he has eaten. CT chest on 05/19/21 showed "high attenuation material noted in the collapsed lung parenchyma". This admission CXR on 05/29/21 showed: "bilateral pleural effusions and associated atelectasis or consolidation" and "mild, diffuse, interstitial pulmonary opacity and pulmonary vascular prominence, likely mild edema in the setting of cardiomegaly."      SLP Plan  All goals met       Recommendations  Diet recommendations: Dysphagia 3 (mechanical soft);Thin liquid (for ease of cut foods/meats) Liquids provided via: Cup;Straw (monitor) Medication Administration: Whole meds with liquid (or if desires, Whole in tsp of Puree for ease of intake) Supervision: Patient able to self feed Compensations: Minimize environmental distractions;Slow rate;Small sips/bites Postural Changes and/or Swallow Maneuvers: Out of bed for meals;Seated upright 90 degrees;Upright 30-60 min after meal (REFLUX precautions)                General recommendations:  (GI f/u) Oral Care Recommendations: Oral care BID;Oral care before and after PO;Patient independent with oral care Follow up Recommendations: None SLP Visit Diagnosis: Dysphagia, unspecified (R13.10) Plan: All goals met       GO                 Orinda Kenner, MS, CCC-SLP Speech Language Pathologist Rehab Services [phone] [facility] 05/31/2021, 5:47 PM

## 2021-05-31 NOTE — Progress Notes (Signed)
Mobility Specialist - Progress Note   05/31/21 1100  Mobility  Activity Ambulated in hall  Level of Assistance Standby assist, set-up cues, supervision of patient - no hands on  Assistive Device Front wheel walker  Distance Ambulated (ft) 180 ft  Mobility Ambulated with assistance in hallway  Mobility Response Tolerated well  Mobility performed by Mobility specialist  $Mobility charge 1 Mobility    Pre-mobility: 58 HR, 96% SpO2 During mobility: 71 HR, 86% SpO2 Post-mobility: 66 HR, 91% SpO2   Pt in recliner upon arrival to room, utilizing 1L. Bumped to 2L for OOB activity. Pt ambulated in hallway with RW and supervision. VC to slow pace. Rest break initiated as pt voiced feeling winded during activity. Pt left in recliner, still on 2L. Motivated to ambulate later this date, will attempt as time permits.   Kathee Delton Mobility Specialist 05/31/21, 11:40 AM

## 2021-05-31 NOTE — Progress Notes (Addendum)
Progress Note    James STGERMAINE  FIE:[id number] DOB: Nov 24, 1930  DOA: 05/29/2021 PCP: Baxter Hire, MD      Brief Narrative:    Medical records reviewed and are as summarized below:  [name] is a 87 y.o. male with medical history significant for CKD stage IV, hypertension, proteinuria, hyperparathyroidism due to CKD, hyperlipidemia, history of aspiration pneumonia treated outpatient, chronic systolic CHF with EF of 16%, ischemic cardiomyopathy, bilateral carotid stenosis, severe aortic stenosis, paroxysmal SVT, history of CABG, status post LIMA to LAD, SVG to OM1, OM2, D1, PDA, status post bioprosthetic valve replacement in 2011.  He was referred to the hospital because of shortness of breath and concern for aspiration pneumonia.      Assessment/Plan:   Principal Problem:   AKI (acute kidney injury) ([place]) Active Problems:   Abdominal aortic aneurysm (AAA) without rupture (HCC)   Benign essential HTN   Benign prostatic hyperplasia with urinary obstruction   Chronic kidney disease   CAD in native artery   S/P total knee arthroplasty   MGUS (monoclonal gammopathy of unknown significance)   Acute renal failure superimposed on stage 4 chronic kidney disease (HCC)   Nutrition Problem: Increased nutrient needs Etiology: chronic illness (CHF)  Signs/Symptoms: estimated needs   Body mass index is 22.11 kg/m.  Acute on chronic systolic CHF: Continue IV Lasix.  Monitor BMP, daily weight and urine output.  2D echo in May 2022 showed EF estimated at less than 20%.  AKI on CKD stage IV: presumed to be from cardiorenal syndrome.  Continue IV Lasix and monitor BMP.  Follow-up with nephrologist for further recommendations.  Dysphagia: Speech therapist recommended dysphagia 3 diet.  Barium swallow has been ordered for further evaluation.  Start Protonix for ? GERD.  Outpatient follow-up with gastroenterologist as needed.  Chronic thrombocytopenia: Platelet count is  stable.  Other comorbidities include CAD, MGUS,    Diet Order             DIET DYS 3 Room service appropriate? Yes; Fluid consistency: Thin  Diet effective now                      Consultants: Nephrologist  Procedures: None    Medications:    amiodarone  200 mg Oral Daily   aspirin EC  81 mg Oral Daily   feeding supplement  237 mL Oral BID BM   fluticasone  2 spray Each Nare Daily   furosemide  60 mg Intravenous Daily   heparin  5,000 Units Subcutaneous Q8H   potassium chloride SA  40 mEq Oral Daily   pravastatin  20 mg Oral QPM   Continuous Infusions:   Anti-infectives (From admission, onward)    Start     Dose/Rate Route Frequency Ordered Stop   05/29/21 2115  Ampicillin-Sulbactam (UNASYN) 3 g in sodium chloride 0.9 % 100 mL IVPB        3 g 200 mL/hr over 30 Minutes Intravenous  Once 05/29/21 2112 05/29/21 2212              Family Communication/Anticipated D/C date and plan/Code Status   DVT prophylaxis: heparin injection 5,000 Units Start: 05/29/21 2215 Place TED hose Start: 05/29/21 2201     Code Status: Partial Code  Family Communication: None Disposition Plan:    Status is: Inpatient  Remains inpatient appropriate because:Inpatient level of care appropriate due to severity of illness  Dispo: The patient is from: Home  Anticipated d/c is to: Home              Patient currently is not medically stable to d/c.   Difficult to place patient No           Subjective:   Interval events noted.  He still has some swelling in the legs.  No shortness of breath or chest pain.  He asked for a smaller oxygen tank that he can easily carry around.  Objective:    Vitals:   05/30/21 0737 05/30/21 2010 05/31/21 0500 05/31/21 0745  BP: 107/67 101/67 113/76 114/76  Pulse: (!) 57 64 (!) 58 62  Resp: 17 20  18   Temp: 97.6 F (36.4 C) 97.6 F (36.4 C) 98 F (36.7 C) 97.7 F (36.5 C)  TempSrc:  Oral Oral Oral  SpO2:  97% 95% 98% 99%  Weight:   71.9 kg   Height:       No data found.   Intake/Output Summary (Last 24 hours) at 05/31/2021 0938 Last data filed at 05/31/2021 0231 Gross per 24 hour  Intake 240 ml  Output 2500 ml  Net -2260 ml   Filed Weights   05/29/21 1736 05/31/21 0500  Weight: 73 kg 71.9 kg    Exam:  GEN: NAD SKIN: No rash EYES: EOMI ENT: MMM CV: RRR PULM: CTA B ABD: soft, ND, NT, +BS CNS: AAO x 3, non focal EXT: B/l leg edema, no tenderness or tenderness    Data Reviewed:   I have personally reviewed following labs and imaging studies:  Labs: Labs show the following:   Basic Metabolic Panel: Recent Labs  Lab 05/29/21 1736 05/30/21 0140  NA 134* 135  K 3.7 3.6  CL 98 99  CO2 22 23  GLUCOSE 104* 99  BUN 110* 110*  CREATININE 4.01* 3.88*  CALCIUM 8.7* 8.6*   GFR Estimated Creatinine Clearance: 13.4 mL/min (A) (by C-G formula based on SCr of 3.88 mg/dL (H)). Liver Function Tests: No results for input(s): AST, ALT, ALKPHOS, BILITOT, PROT, ALBUMIN in the last 168 hours. No results for input(s): LIPASE, AMYLASE in the last 168 hours. No results for input(s): AMMONIA in the last 168 hours. Coagulation profile No results for input(s): INR, PROTIME in the last 168 hours.  CBC: Recent Labs  Lab 05/29/21 1736 05/30/21 0140 05/31/21 0838  WBC 6.5 5.7 6.2  HGB 11.4* 11.2* 11.2*  HCT 33.7* 32.8* 32.0*  MCV 92.8 92.9 93.6  PLT 122* 111* 118*   Cardiac Enzymes: No results for input(s): CKTOTAL, CKMB, CKMBINDEX, TROPONINI in the last 168 hours. BNP (last 3 results) No results for input(s): PROBNP in the last 8760 hours. CBG: No results for input(s): GLUCAP in the last 168 hours. D-Dimer: No results for input(s): DDIMER in the last 72 hours. Hgb A1c: No results for input(s): HGBA1C in the last 72 hours. Lipid Profile: No results for input(s): CHOL, HDL, LDLCALC, TRIG, CHOLHDL, LDLDIRECT in the last 72 hours. Thyroid function studies: No results for  input(s): TSH, T4TOTAL, T3FREE, THYROIDAB in the last 72 hours.  Invalid input(s): FREET3 Anemia work up: No results for input(s): VITAMINB12, FOLATE, FERRITIN, TIBC, IRON, RETICCTPCT in the last 72 hours. Sepsis Labs: Recent Labs  Lab 05/29/21 1736 05/30/21 0140 05/31/21 0838  PROCALCITON  --  <0.10  --   WBC 6.5 5.7 6.2    Microbiology Recent Results (from the past 240 hour(s))  Resp Panel by RT-PCR (Flu A&B, Covid) Nasopharyngeal Swab     Status: None  Collection Time: 05/29/21  8:14 PM   Specimen: Nasopharyngeal Swab; Nasopharyngeal(NP) swabs in vial transport medium  Result Value Ref Range Status   SARS Coronavirus 2 by RT PCR NEGATIVE NEGATIVE Final    Comment: (NOTE) SARS-CoV-2 target nucleic acids are NOT DETECTED.  The SARS-CoV-2 RNA is generally detectable in upper respiratory specimens during the acute phase of infection. The lowest concentration of SARS-CoV-2 viral copies this assay can detect is 138 copies/mL. A negative result does not preclude SARS-Cov-2 infection and should not be used as the sole basis for treatment or other patient management decisions. A negative result may occur with  improper specimen collection/handling, submission of specimen other than nasopharyngeal swab, presence of viral mutation(s) within the areas targeted by this assay, and inadequate number of viral copies(<138 copies/mL). A negative result must be combined with clinical observations, patient history, and epidemiological information. The expected result is Negative.  Fact Sheet for Patients:  EntrepreneurPulse.com.au  Fact Sheet for Healthcare Providers:  IncredibleEmployment.be  This test is no t yet approved or cleared by the [place] FDA and  has been authorized for detection and/or diagnosis of SARS-CoV-2 by FDA under an Emergency Use Authorization (EUA). This EUA will remain  in effect (meaning this test can be used) for the  duration of the COVID-19 declaration under Section 564(b)(1) of the Act, 21 U.S.C.section 360bbb-3(b)(1), unless the authorization is terminated  or revoked sooner.       Influenza A by PCR NEGATIVE NEGATIVE Final   Influenza B by PCR NEGATIVE NEGATIVE Final    Comment: (NOTE) The Xpert Xpress SARS-CoV-2/FLU/RSV plus assay is intended as an aid in the diagnosis of influenza from Nasopharyngeal swab specimens and should not be used as a sole basis for treatment. Nasal washings and aspirates are unacceptable for Xpert Xpress SARS-CoV-2/FLU/RSV testing.  Fact Sheet for Patients: EntrepreneurPulse.com.au  Fact Sheet for Healthcare Providers: IncredibleEmployment.be  This test is not yet approved or cleared by the [place] FDA and has been authorized for detection and/or diagnosis of SARS-CoV-2 by FDA under an Emergency Use Authorization (EUA). This EUA will remain in effect (meaning this test can be used) for the duration of the COVID-19 declaration under Section 564(b)(1) of the Act, 21 U.S.C. section 360bbb-3(b)(1), unless the authorization is terminated or revoked.  Performed at [facility], [address] [place], [place] [address]     Procedures and diagnostic studies:  DG Chest 2 View  Result Date: 05/29/2021 CLINICAL DATA:  Shortness of breath, pneumonia EXAM: CHEST - 2 VIEW COMPARISON:  02/07/2021 FINDINGS: Gross cardiomegaly status post median sternotomy and CABG with aortic valve prosthesis. Small, loculated appearing bilateral pleural effusions and associated atelectasis or consolidation, similar in appearance to prior examination. Mild, diffuse interstitial pulmonary opacity and pulmonary vascular prominence. IMPRESSION: 1. Small, loculated appearing bilateral pleural effusions and associated atelectasis or consolidation, similar in appearance to prior examination. 2. Mild, diffuse interstitial pulmonary opacity and  pulmonary vascular prominence, likely mild edema in the setting of cardiomegaly and similar to prior. No focal airspace opacity. Electronically Signed   By: Eddie Candle M.D.   On: 05/29/2021 18:26   DG Chest Port 1 View  Result Date: 05/31/2021 CLINICAL DATA:  Pleural effusion shortness of breath. EXAM: PORTABLE CHEST 1 VIEW COMPARISON:  05/29/2021 FINDINGS: Low volume film. The cardio pericardial silhouette is enlarged. Similar vascular congestion and bibasilar airspace disease. Small bilateral pleural effusions noted. Bones are diffusely demineralized. IMPRESSION: 1. No substantial interval change. 2. Cardiomegaly with vascular  congestion and possible interstitial edema. 3. Bibasilar collapse/consolidation with small bilateral pleural effusions . Electronically Signed   By: Misty Stanley M.D.   On: 05/31/2021 07:35               LOS: 1 day   Derick Seminara  [facility]   Pager on [email or web address]. If 7PM-7AM, please contact night-coverage at [email or web address]     05/31/2021, 9:38 AM

## 2021-05-31 NOTE — TOC Initial Note (Signed)
Transition of Care [facility]) - Initial/Assessment Note    Patient Details  Name: [name] MRN: [id number] Date of Birth: 1931/01/20  Transition of [facility] CM/SW Contact:    Candie Chroman, LCSW Phone Number: 05/31/2021, 12:35 PM  Clinical Narrative:   Readmission prevention screen complete. CSW met with patient. No supports at bedside. CSW introduced role and explained that discharge planning would be discussed. PCP is Harrel Lemon, MD at [facility]. His daughter, son, or daughter-in-law transport him to appointments. [facility]. No issues obtaining medications. No home health services prior to admission. Patient uses a walker at home. Also has an electric chair, shower chair, bars in the shower, and oxygen. Patient with concerns about size of his oxygen tanks and difficulty getting around with them. Updated him on conversation with [employer] yesterday. They said his tanks are only a little over a foot long but they can do an OCD evaluation at the store to see if he qualifies for smaller tanks. MD entered order yesterday. No further concerns. CSW encouraged patient to contact CSW as needed. CSW will continue to follow patient for support and facilitate return home when stable.        Expected Discharge Plan: Home/Self Care Barriers to Discharge: Continued Medical Work up   Patient Goals and CMS Choice        Expected Discharge Plan and Services Expected Discharge Plan: Home/Self Care     Post Acute Care Choice: NA Living arrangements for the past 2 months: Single Family Home                                      Prior Living Arrangements/Services Living arrangements for the past 2 months: Single Family Home Lives with:: Self Patient language and need for interpreter reviewed:: Yes Do you feel safe going back to the place where you live?: Yes      Need for Family Participation in Patient Care: Yes (Comment) Care giver support system in place?: Yes  (comment) Current home services: DME Criminal Activity/Legal Involvement Pertinent to Current Situation/Hospitalization: No - Comment as needed  Activities of Daily Living Home Assistive Devices/Equipment: Oxygen, Eyeglasses, Walker (specify type) ADL Screening (condition at time of admission) Patient's cognitive ability adequate to safely complete daily activities?: Yes Is the patient deaf or have difficulty hearing?: Yes Does the patient have difficulty seeing, even when wearing glasses/contacts?: No Does the patient have difficulty concentrating, remembering, or making decisions?: No Patient able to express need for assistance with ADLs?: Yes Does the patient have difficulty dressing or bathing?: No Independently performs ADLs?: Yes (appropriate for developmental age) Does the patient have difficulty walking or climbing stairs?: No Weakness of Legs: Both Weakness of Arms/Hands: None  Permission Sought/Granted                  Emotional Assessment Appearance:: Appears stated age Attitude/Demeanor/Rapport: Engaged, Gracious Affect (typically observed): Accepting, Appropriate, Calm, Pleasant Orientation: : Oriented to Self, Oriented to Place, Oriented to  Time, Oriented to Situation Alcohol / Substance Use: Not Applicable Psych Involvement: No (comment)  Admission diagnosis:  Pleural effusion [J90] AKI (acute kidney injury) ([place]) [N17.9] Pneumonia due to infectious organism, unspecified laterality, unspecified part of lung [J18.9] Patient Active Problem List   Diagnosis Date Noted   AKI (acute kidney injury) ([place]) 05/29/2021   Unstageable pressure ulcer of sacral region ([place]) 02/01/2021   CHF exacerbation (  [place]) 01/31/2021   CHF (congestive heart failure), NYHA class IV, acute on chronic, systolic ([place]) 51/70/0174   Acute respiratory failure with hypoxia (HCC) 09/07/2020   Elevated troponin 09/07/2020   Pleural effusion 09/07/2020   Acute on chronic systolic CHF  (congestive heart failure) ([place]) 09/07/2020   Thoracic compression fracture ([place]) 09/07/2020   Pressure injury of skin 08/07/2020   CHF (congestive heart failure) ([place]) 08/05/2020   Periprosthetic fracture around internal prosthetic hip joint 08/05/2020   Hyponatremia 08/05/2020   Constipation 05/15/2020   Acute renal failure superimposed on stage 4 chronic kidney disease ([place]) 05/15/2020   Hip fracture ([place]) 05/11/2020   MGUS (monoclonal gammopathy of unknown significance) 05/23/2016   S/P total knee arthroplasty 04/02/2016   Chronic kidney disease 11/22/2015   CAD in native artery 11/22/2015   TI (tricuspid incompetence) 05/11/2015   Early satiety 01/25/2015   Benign essential HTN 01/03/2015   Abdominal aortic aneurysm (AAA) without rupture ([place]) 07/09/2014   MI (mitral incompetence) 07/09/2014   Aortic heart valve narrowing 07/09/2014   Cardiomyopathy, ischemic 06/28/2014   Arthritis of knee, degenerative 05/17/2014   Kidney lump 04/09/2013   Benign prostatic hyperplasia with urinary obstruction 03/17/2013   Chronic kidney disease, stage IV (severe) ([place]) 03/17/2013   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

## 2021-05-31 NOTE — Progress Notes (Signed)
[facility]  ROUNDING NOTE   Subjective:   [name] is a 87 y.o. male with past medical history including hypertension, proteinuria, ischemic cardiomyopathy, bilaternal carotid stenosis, and CKD stage 4. He has presented to ED after his nephrology appt with concerns of prolonged aspiration pneumonia. He has been admitted to [facility] for Pleural effusion [J90] AKI (acute kidney injury) ([place]) [N17.9] Pneumonia due to infectious organism, unspecified laterality, unspecified part of lung [J18.9]   Patient is followed by our clinic, under the supervision of Dr Holley Raring. He reports drinking 3-16oz bottles of water daily. He continues to take medications prescribed, including diuretic.  Patient seen sitting up in chair Alert and oriented Recorded UOP 2.5L in last 24 hours Denies shortness of breath Complains of fatigue, but reports improvement since admission   Objective:  Vital signs in last 24 hours:  Temp:  [97.6 F (36.4 C)-98 F (36.7 C)] 97.7 F (36.5 C) (09/14 0745) Pulse Rate:  [58-64] 62 (09/14 0745) Resp:  [18-20] 18 (09/14 0745) BP: (101-114)/(67-76) 114/76 (09/14 0745) SpO2:  [95 %-99 %] 99 % (09/14 0745) Weight:  [71.9 kg] 71.9 kg (09/14 0500)  Weight change: -1.129 kg Filed Weights   05/29/21 1736 05/31/21 0500  Weight: 73 kg 71.9 kg    Intake/Output: I/O last 3 completed shifts: In: 240 [P.O.:240] Out: 3300 [Urine:3300]   Intake/Output this shift:  Total I/O In: 180 [P.O.:180] Out: 500 [Urine:500]  Physical Exam: General: NAD, sitting in chair  Head: Normocephalic, atraumatic. Moist oral mucosal membranes  Eyes: Anicteric  Lungs:  Clear to auscultation, [place] O2  Heart: Regular rate and rhythm  Abdomen:  Soft, nontender  Extremities:  1+ peripheral edema Left lower extremity   Neurologic: Nonfocal, moving all four extremities  Skin: No lesions       Basic Metabolic Panel: Recent Labs  Lab 05/29/21 1736 05/30/21 0140 05/31/21 0838   NA 134* 135 135  K 3.7 3.6 3.8  CL 98 99 99  CO2 22 23 24   GLUCOSE 104* 99 105*  BUN 110* 110* 95*  CREATININE 4.01* 3.88* 3.75*  CALCIUM 8.7* 8.6* 8.7*  PHOS  --   --  5.0*     Liver Function Tests: Recent Labs  Lab 05/31/21 0838  ALBUMIN 3.3*   No results for input(s): LIPASE, AMYLASE in the last 168 hours. No results for input(s): AMMONIA in the last 168 hours.  CBC: Recent Labs  Lab 05/29/21 1736 05/30/21 0140 05/31/21 0838  WBC 6.5 5.7 6.2  HGB 11.4* 11.2* 11.2*  HCT 33.7* 32.8* 32.0*  MCV 92.8 92.9 93.6  PLT 122* 111* 118*     Cardiac Enzymes: No results for input(s): CKTOTAL, CKMB, CKMBINDEX, TROPONINI in the last 168 hours.  BNP: Invalid input(s): POCBNP  CBG: No results for input(s): GLUCAP in the last 168 hours.  Microbiology: Results for orders placed or performed during the hospital encounter of 05/29/21  Resp Panel by RT-PCR (Flu A&B, Covid) Nasopharyngeal Swab     Status: None   Collection Time: 05/29/21  8:14 PM   Specimen: Nasopharyngeal Swab; Nasopharyngeal(NP) swabs in vial transport medium  Result Value Ref Range Status   SARS Coronavirus 2 by RT PCR NEGATIVE NEGATIVE Final    Comment: (NOTE) SARS-CoV-2 target nucleic acids are NOT DETECTED.  The SARS-CoV-2 RNA is generally detectable in upper respiratory specimens during the acute phase of infection. The lowest concentration of SARS-CoV-2 viral copies this assay can detect is 138 copies/mL. A negative result does not  preclude SARS-Cov-2 infection and should not be used as the sole basis for treatment or other patient management decisions. A negative result may occur with  improper specimen collection/handling, submission of specimen other than nasopharyngeal swab, presence of viral mutation(s) within the areas targeted by this assay, and inadequate number of viral copies(<138 copies/mL). A negative result must be combined with clinical observations, patient history, and  epidemiological information. The expected result is Negative.  Fact Sheet for Patients:  EntrepreneurPulse.com.au  Fact Sheet for Healthcare Providers:  IncredibleEmployment.be  This test is no t yet approved or cleared by the [place] FDA and  has been authorized for detection and/or diagnosis of SARS-CoV-2 by FDA under an Emergency Use Authorization (EUA). This EUA will remain  in effect (meaning this test can be used) for the duration of the COVID-19 declaration under Section 564(b)(1) of the Act, 21 U.S.C.section 360bbb-3(b)(1), unless the authorization is terminated  or revoked sooner.       Influenza A by PCR NEGATIVE NEGATIVE Final   Influenza B by PCR NEGATIVE NEGATIVE Final    Comment: (NOTE) The Xpert Xpress SARS-CoV-2/FLU/RSV plus assay is intended as an aid in the diagnosis of influenza from Nasopharyngeal swab specimens and should not be used as a sole basis for treatment. Nasal washings and aspirates are unacceptable for Xpert Xpress SARS-CoV-2/FLU/RSV testing.  Fact Sheet for Patients: EntrepreneurPulse.com.au  Fact Sheet for Healthcare Providers: IncredibleEmployment.be  This test is not yet approved or cleared by the [place] FDA and has been authorized for detection and/or diagnosis of SARS-CoV-2 by FDA under an Emergency Use Authorization (EUA). This EUA will remain in effect (meaning this test can be used) for the duration of the COVID-19 declaration under Section 564(b)(1) of the Act, 21 U.S.C. section 360bbb-3(b)(1), unless the authorization is terminated or revoked.  Performed at [facility], [address] [place], [name] [address]     Coagulation Studies: No results for input(s): LABPROT, INR in the last 72 hours.  Urinalysis: Recent Labs    05/30/21 0600  COLORURINE STRAW*  LABSPEC 1.006  PHURINE 6.0  GLUCOSEU NEGATIVE  HGBUR MODERATE*   BILIRUBINUR NEGATIVE  KETONESUR NEGATIVE  PROTEINUR NEGATIVE  NITRITE NEGATIVE  LEUKOCYTESUR NEGATIVE       Imaging: DG Chest 2 View  Result Date: 05/29/2021 CLINICAL DATA:  Shortness of breath, pneumonia EXAM: CHEST - 2 VIEW COMPARISON:  02/07/2021 FINDINGS: Gross cardiomegaly status post median sternotomy and CABG with aortic valve prosthesis. Small, loculated appearing bilateral pleural effusions and associated atelectasis or consolidation, similar in appearance to prior examination. Mild, diffuse interstitial pulmonary opacity and pulmonary vascular prominence. IMPRESSION: 1. Small, loculated appearing bilateral pleural effusions and associated atelectasis or consolidation, similar in appearance to prior examination. 2. Mild, diffuse interstitial pulmonary opacity and pulmonary vascular prominence, likely mild edema in the setting of cardiomegaly and similar to prior. No focal airspace opacity. Electronically Signed   By: Eddie Candle M.D.   On: 05/29/2021 18:26   DG Chest Port 1 View  Result Date: 05/31/2021 CLINICAL DATA:  Pleural effusion shortness of breath. EXAM: PORTABLE CHEST 1 VIEW COMPARISON:  05/29/2021 FINDINGS: Low volume film. The cardio pericardial silhouette is enlarged. Similar vascular congestion and bibasilar airspace disease. Small bilateral pleural effusions noted. Bones are diffusely demineralized. IMPRESSION: 1. No substantial interval change. 2. Cardiomegaly with vascular congestion and possible interstitial edema. 3. Bibasilar collapse/consolidation with small bilateral pleural effusions . Electronically Signed   By: Misty Stanley M.D.   On: 05/31/2021 07:35  Medications:     amiodarone  200 mg Oral Daily   aspirin EC  81 mg Oral Daily   feeding supplement  237 mL Oral BID BM   fluticasone  2 spray Each Nare Daily   heparin  5,000 Units Subcutaneous Q8H   potassium chloride SA  40 mEq Oral Daily   pravastatin  20 mg Oral QPM   torsemide  20 mg Oral Daily    acetaminophen **OR** acetaminophen, albuterol, ALPRAZolam, melatonin, ondansetron **OR** ondansetron (ZOFRAN) IV  Assessment/ Plan:  [name] is a 87 y.o.  male with past medical history including hypertension, proteinuria, ischemic cardiomyopathy, bilaternal carotid stenosis, and CKD stage 4. He has presented to ED after his nephrology appt with concerns of prolonged aspiration pneumonia. He has been admitted to [facility] for Pleural effusion [J90] AKI (acute kidney injury) ([place]) [N17.9] Pneumonia due to infectious organism, unspecified laterality, unspecified part of lung [J18.9]   Acute Kidney Injury with hyponatremia on chronic kidney disease stage 4 with baseline creatinine 2.71 and GFR of 22 on 03/09/21.  Acute kidney injury secondary to cardiorenal syndrome  No IV contrast exposure. Losartan held Creatinine remains elevated at 3.75. Will transition to oral diuretics today and monitor levels. Sodium stable at 135.  Lab Results  Component Value Date   CREATININE 3.75 (H) 05/31/2021   CREATININE 3.88 (H) 05/30/2021   CREATININE 4.01 (H) 05/29/2021    Intake/Output Summary (Last 24 hours) at 05/31/2021 1219 Last data filed at 05/31/2021 1100 Gross per 24 hour  Intake 420 ml  Output 2100 ml  Net -1680 ml     2.Systolic heart failure, home regimen includes Torsemide 20mg  daily ECHO on 01/31/21 shows EF <20. Successful diuresis with IV lasix. Will transition to Torsemide 20mg  daily today.  3. Hypertension Home regimen includes Losartan, amiodarone and torsemide. Losartan held. BP stable 114/76  4. Anemia of chronic kidney disease Lab Results  Component Value Date   HGB 11.2 (L) 05/31/2021   Hgb at goal    LOS: 1 [place] 9/14/202212:19 PM

## 2021-05-31 NOTE — Progress Notes (Signed)
Mobility Specialist - Progress Note   05/31/21 1500  Mobility  Activity Ambulated in hall  Level of Assistance Standby assist, set-up cues, supervision of patient - no hands on  Assistive Device Front wheel walker  Distance Ambulated (ft) 180 ft  Mobility Ambulated with assistance in hallway  Mobility Response Tolerated well  Mobility performed by Mobility specialist  $Mobility charge 1 Mobility    Pre-mobility: 61 HR, 99% SpO2 During mobility: 72 HR, 91% SpO2 Post-mobility: 64 HR, 94% SpO2   Pt sitting in recliner upon arrival, utilizing 2L. Ambulated in hallway with RW at good pace, self-initiating short rest breaks every ~40'. O2 maintained > 90% during activity, desat to a low of 86% once seated. Denied SOB, but does voice feeling mildly winded. Pt left in recliner with needs in reach.    Kathee Delton Mobility Specialist 05/31/21, 3:41 PM

## 2021-05-31 NOTE — Plan of Care (Signed)
Pt rested well during night. No prns given. Pt med compliant. Continues to have 2+ edema BLE. Pt reports breathing better during the night than he has in a while. Vitals stable.  Problem: Education: Goal: Knowledge of General Education information will improve Description: Including pain rating scale, medication(s)/side effects and non-pharmacologic comfort measures Outcome: Progressing   Problem: Health Behavior/Discharge Planning: Goal: Ability to manage health-related needs will improve Outcome: Progressing   Problem: Clinical Measurements: Goal: Ability to maintain clinical measurements within normal limits will improve Outcome: Progressing Goal: Will remain free from infection Outcome: Progressing Goal: Diagnostic test results will improve Outcome: Progressing Goal: Respiratory complications will improve Outcome: Progressing Goal: Cardiovascular complication will be avoided Outcome: Progressing   Problem: Activity: Goal: Risk for activity intolerance will decrease Outcome: Progressing   Problem: Nutrition: Goal: Adequate nutrition will be maintained Outcome: Progressing   Problem: Coping: Goal: Level of anxiety will decrease Outcome: Progressing   Problem: Pain Managment: Goal: General experience of comfort will improve Outcome: Progressing   Problem: Safety: Goal: Ability to remain free from injury will improve Outcome: Progressing   Problem: Skin Integrity: Goal: Risk for impaired skin integrity will decrease Outcome: Progressing   Problem: Fluid Volume: Goal: Compliance with measures to maintain balanced fluid volume will improve Outcome: Progressing

## 2021-05-31 NOTE — Progress Notes (Addendum)
Met with patient today.  He is sitting up in the chair at bedside and states that he feels good and does not have any complaints.  He states that he has not heard any more from the compression [place].  I told him that I had talked to [facility] from the outpatient heart failure clinic about his hospitalization.  She felt as long as I felt good with how he was doing that he could keep his previously scheduled appointment.  Discussed moving his appointment from December to November and he was good with that.  And knows to call if he feels that he is getting into trouble to be seen sooner.  Pricilla Riffle RN CHFN

## 2021-06-01 ENCOUNTER — Inpatient Hospital Stay: Payer: PPO

## 2021-06-01 MED ORDER — TORSEMIDE 20 MG PO TABS
20.0000 mg | ORAL_TABLET | Freq: Every day | ORAL | 1 refills | Status: DC
Start: 2021-06-01 — End: 2021-10-16

## 2021-06-01 MED ORDER — AMIODARONE HCL 200 MG PO TABS: 200.0000 mg | ORAL_TABLET | Freq: Every day | ORAL | 1 refills | Status: DC

## 2021-06-01 MED ORDER — OMEPRAZOLE MAGNESIUM 20 MG PO TBEC: 20.0000 mg | DELAYED_RELEASE_TABLET | Freq: Every day | ORAL | Status: DC

## 2021-06-01 NOTE — Plan of Care (Signed)
Pt rested during overnight. Pt had 2 occurrence of heparin injection site bleeding. Rachael Fee NP msg prior to 0600 dose and dose was held for 0600. Teds hose placed this am. Decrease in edema noted to bilateral lower extremities.  Problem: Education: Goal: Knowledge of General Education information will improve Description: Including pain rating scale, medication(s)/side effects and non-pharmacologic comfort measures Outcome: Progressing   Problem: Health Behavior/Discharge Planning: Goal: Ability to manage health-related needs will improve Outcome: Progressing   Problem: Clinical Measurements: Goal: Ability to maintain clinical measurements within normal limits will improve Outcome: Progressing Goal: Will remain free from infection Outcome: Progressing Goal: Diagnostic test results will improve Outcome: Progressing Goal: Respiratory complications will improve Outcome: Progressing Goal: Cardiovascular complication will be avoided Outcome: Progressing   Problem: Activity: Goal: Risk for activity intolerance will decrease Outcome: Progressing   Problem: Nutrition: Goal: Adequate nutrition will be maintained Outcome: Progressing   Problem: Coping: Goal: Level of anxiety will decrease Outcome: Progressing   Problem: Pain Managment: Goal: General experience of comfort will improve Outcome: Progressing   Problem: Safety: Goal: Ability to remain free from injury will improve Outcome: Progressing   Problem: Skin Integrity: Goal: Risk for impaired skin integrity will decrease Outcome: Progressing   Problem: Fluid Volume: Goal: Compliance with measures to maintain balanced fluid volume will improve Outcome: Progressing

## 2021-06-01 NOTE — Progress Notes (Signed)
[facility]  ROUNDING NOTE   Subjective:   UOP 2.8 liters. - torsemide 10mg  PO daily. Given at 39 yesterday.   Patient states he is breathing well.   Difficulty with swallowing.   No new labs today.    Objective:  Vital signs in last 24 hours:  Temp:  [97.5 F (36.4 C)-98 F (36.7 C)] 97.5 F (36.4 C) (09/15 0836) Pulse Rate:  [58-64] 64 (09/15 0836) Resp:  [16-20] 18 (09/15 0836) BP: (105-108)/(66-70) 105/66 (09/15 0836) SpO2:  [99 %-100 %] 100 % (09/15 0836) Weight:  [71.4 kg] 71.4 kg (09/15 0600)  Weight change: -0.5 kg Filed Weights   05/29/21 1736 05/31/21 0500 06/01/21 0600  Weight: 73 kg 71.9 kg 71.4 kg    Intake/Output: I/O last 3 completed shifts: In: 540 [P.O.:540] Out: 3575 [Urine:3575]   Intake/Output this shift:  Total I/O In: 120 [P.O.:120] Out: 725 [Urine:725]  Physical Exam: General: NAD, sitting in chair  Head: Normocephalic, atraumatic. Moist oral mucosal membranes  Eyes: Anicteric  Lungs:  Clear to auscultation, [place] O2  Heart: Regular rate and rhythm  Abdomen:  Soft, nontender  Extremities:  no peripheral edema    Neurologic: Nonfocal, moving all four extremities  Skin: No lesions       Basic Metabolic Panel: Recent Labs  Lab 05/29/21 1736 05/30/21 0140 05/31/21 0838  NA 134* 135 135  K 3.7 3.6 3.8  CL 98 99 99  CO2 22 23 24   GLUCOSE 104* 99 105*  BUN 110* 110* 95*  CREATININE 4.01* 3.88* 3.75*  CALCIUM 8.7* 8.6* 8.7*  PHOS  --   --  5.0*     Liver Function Tests: Recent Labs  Lab 05/31/21 0838  ALBUMIN 3.3*    No results for input(s): LIPASE, AMYLASE in the last 168 hours. No results for input(s): AMMONIA in the last 168 hours.  CBC: Recent Labs  Lab 05/29/21 1736 05/30/21 0140 05/31/21 0838  WBC 6.5 5.7 6.2  HGB 11.4* 11.2* 11.2*  HCT 33.7* 32.8* 32.0*  MCV 92.8 92.9 93.6  PLT 122* 111* 118*     Cardiac Enzymes: No results for input(s): CKTOTAL, CKMB, CKMBINDEX, TROPONINI in the last  168 hours.  BNP: Invalid input(s): POCBNP  CBG: No results for input(s): GLUCAP in the last 168 hours.  Microbiology: Results for orders placed or performed during the hospital encounter of 05/29/21  Resp Panel by RT-PCR (Flu A&B, Covid) Nasopharyngeal Swab     Status: None   Collection Time: 05/29/21  8:14 PM   Specimen: Nasopharyngeal Swab; Nasopharyngeal(NP) swabs in vial transport medium  Result Value Ref Range Status   SARS Coronavirus 2 by RT PCR NEGATIVE NEGATIVE Final    Comment: (NOTE) SARS-CoV-2 target nucleic acids are NOT DETECTED.  The SARS-CoV-2 RNA is generally detectable in upper respiratory specimens during the acute phase of infection. The lowest concentration of SARS-CoV-2 viral copies this assay can detect is 138 copies/mL. A negative result does not preclude SARS-Cov-2 infection and should not be used as the sole basis for treatment or other patient management decisions. A negative result may occur with  improper specimen collection/handling, submission of specimen other than nasopharyngeal swab, presence of viral mutation(s) within the areas targeted by this assay, and inadequate number of viral copies(<138 copies/mL). A negative result must be combined with clinical observations, patient history, and epidemiological information. The expected result is Negative.  Fact Sheet for Patients:  EntrepreneurPulse.com.au  Fact Sheet for Healthcare Providers:  IncredibleEmployment.be  This test  is no t yet approved or cleared by the [place] and  has been authorized for detection and/or diagnosis of SARS-CoV-2 by FDA under an Emergency Use Authorization (EUA). This EUA will remain  in effect (meaning this test can be used) for the duration of the COVID-19 declaration under Section 564(b)(1) of the Act, 21 U.S.C.section 360bbb-3(b)(1), unless the authorization is terminated  or revoked sooner.       Influenza A by  PCR NEGATIVE NEGATIVE Final   Influenza B by PCR NEGATIVE NEGATIVE Final    Comment: (NOTE) The Xpert Xpress SARS-CoV-2/FLU/RSV plus assay is intended as an aid in the diagnosis of influenza from Nasopharyngeal swab specimens and should not be used as a sole basis for treatment. Nasal washings and aspirates are unacceptable for Xpert Xpress SARS-CoV-2/FLU/RSV testing.  Fact Sheet for Patients: EntrepreneurPulse.com.au  Fact Sheet for Healthcare Providers: IncredibleEmployment.be  This test is not yet approved or cleared by the [place] FDA and has been authorized for detection and/or diagnosis of SARS-CoV-2 by FDA under an Emergency Use Authorization (EUA). This EUA will remain in effect (meaning this test can be used) for the duration of the COVID-19 declaration under Section 564(b)(1) of the Act, 21 U.S.C. section 360bbb-3(b)(1), unless the authorization is terminated or revoked.  Performed at [facility], [address] [place], [place] [address]     Coagulation Studies: No results for input(s): LABPROT, INR in the last 72 hours.  Urinalysis: Recent Labs    05/30/21 0600  COLORURINE STRAW*  LABSPEC 1.006  PHURINE 6.0  GLUCOSEU NEGATIVE  HGBUR MODERATE*  BILIRUBINUR NEGATIVE  KETONESUR NEGATIVE  PROTEINUR NEGATIVE  NITRITE NEGATIVE  LEUKOCYTESUR NEGATIVE       Imaging: DG Chest Port 1 View  Result Date: 05/31/2021 CLINICAL DATA:  Pleural effusion shortness of breath. EXAM: PORTABLE CHEST 1 VIEW COMPARISON:  05/29/2021 FINDINGS: Low volume film. The cardio pericardial silhouette is enlarged. Similar vascular congestion and bibasilar airspace disease. Small bilateral pleural effusions noted. Bones are diffusely demineralized. IMPRESSION: 1. No substantial interval change. 2. Cardiomegaly with vascular congestion and possible interstitial edema. 3. Bibasilar collapse/consolidation with small bilateral pleural  effusions . Electronically Signed   By: Misty Stanley M.D.   On: 05/31/2021 07:35     Medications:     amiodarone  200 mg Oral Daily   aspirin EC  81 mg Oral Daily   feeding supplement  237 mL Oral BID BM   fluticasone  2 spray Each Nare Daily   heparin  5,000 Units Subcutaneous Q8H   potassium chloride SA  40 mEq Oral Daily   pravastatin  20 mg Oral QPM   torsemide  20 mg Oral Daily   acetaminophen **OR** acetaminophen, albuterol, ALPRAZolam, melatonin, ondansetron **OR** ondansetron (ZOFRAN) IV  Assessment/ Plan:  Mr. TYNAN BOESEL is a 87 y.o. white male with chronic systolic congestive heart failure EF of  <20% on 01/31/21, hypertension, coronary artery disease, bilateral carotid stenosis who is admitted to [facility] on 05/29/2021 from nephrology clinic with Pleural effusion [J90] AKI (acute kidney injury) ([place]) [N17.9] Pneumonia due to infectious organism, unspecified laterality, unspecified part of lung [J18.9] Concerns for aspiration.   Acute Kidney Injury on chronic kidney disease stage 4 with baseline creatinine 2.71 and GFR of 22 on 03/09/21.  Acute kidney injury secondary to cardiorenal syndrome  - Continue torsemide PO  Lab Results  Component Value Date   CREATININE 3.75 (H) 05/31/2021   CREATININE 3.88 (H) 05/30/2021   CREATININE  4.01 (H) 05/29/2021    Intake/Output Summary (Last 24 hours) at 06/01/2021 1220 Last data filed at 06/01/2021 1049 Gross per 24 hour  Intake 480 ml  Output 3050 ml  Net -2570 ml     2. Acute exacerbation of Systolic congestive heart failure. Status post IV furosemide.  - continue Torsemide 20mg  daily  3. Hypertension: 105/66. Currently holding losartan.   4. Anemia of chronic kidney disease Lab Results  Component Value Date   HGB 11.2 (L) 05/31/2021   Hgb at goal    LOS: 2 Jazmina Muhlenkamp 9/15/202212:20 PM

## 2021-06-01 NOTE — Care Management Important Message (Signed)
Important Message  Patient Details  Name: [name] MRN: [id number] Date of Birth: 1931-03-11   Medicare Important Message Given:  N/A - LOS <3 / Initial given by admissions     Dannette Barbara 06/01/2021, 2:17 PM

## 2021-06-01 NOTE — TOC Transition Note (Signed)
Transition of Care [facility]) - CM/SW Discharge Note   Patient Details  Name: [name] MRN: [id number] Date of Birth: Jun 17, 1931  [facility] CM/SW Contact:  Candie Chroman, LCSW Phone Number: 06/01/2021, 2:38 PM   Clinical Narrative:   Patient has orders to discharge home today. Notified [place] representative. He said OCD evaluation has been scheduled for 9/27. No further concerns. CSW signing off.  Final next level of care: Home/Self Care Barriers to Discharge: Barriers Resolved   Patient Goals and CMS Choice        Discharge Placement                    Patient and family notified of of transfer: 06/01/21  Discharge Plan and Services     Post Acute Care Choice: NA                               Social Determinants of Health (SDOH) Interventions     Readmission Risk Interventions Readmission Risk Prevention Plan 05/31/2021  Transportation Screening Complete  PCP or Specialist Appt within 3-5 Days Complete  Social Work Consult for [place] Planning/Counseling [place] Not Applicable  Medication Review [employer]) Complete  Some recent data might be hidden

## 2021-06-01 NOTE — Discharge Summary (Signed)
Physician Discharge Summary  [name] WHQ:[id number] DOB: [birth year] DOA: 05/29/2021  PCP: James Hire, MD  Admit date: 05/29/2021 Discharge date: 06/01/2021  Discharge disposition: Home   Recommendations for Outpatient Follow-Up:   Follow-up with PCP in 1 week Follow-up with nephrologist in 1 week   Discharge Diagnosis:   Principal Problem:   AKI (acute kidney injury) ([place]) Active Problems:   Abdominal aortic aneurysm (AAA) without rupture ([place])   Benign essential HTN   Benign prostatic hyperplasia with urinary obstruction   Chronic kidney disease   CAD in native artery   S/P total knee arthroplasty   MGUS (monoclonal gammopathy of unknown significance)   Acute renal failure superimposed on stage 4 chronic kidney disease ([place])    Discharge Condition: Stable.  Diet recommendation:  Diet Order             Diet - low sodium heart healthy           DIET DYS 3 Room service appropriate? Yes; Fluid consistency: Thin  Diet effective now                     Code Status: Partial Code     Hospital Course:   [name] is a 87 y.o. male with medical history significant for CKD stage IV, hypertension, proteinuria, hyperparathyroidism due to CKD, hyperlipidemia, history of aspiration pneumonia treated outpatient, chronic systolic CHF with EF of 65%, ischemic cardiomyopathy, bilateral carotid stenosis, severe aortic stenosis, paroxysmal SVT, history of CABG, status post LIMA to LAD, SVG to OM1, OM2, D1, PDA, status post bioprosthetic valve replacement in 2011.  He was referred to the hospital because of shortness of breath and concern for aspiration pneumonia.   He was admitted to the hospital for acute on chronic systolic CHF and AKI on CKD stage IV.  AKI was attributed to cardiorenal syndrome.  He was treated with IV Lasix.  Nephrologist was consulted to assist with management.  He complained of difficulty swallowing and regurgitation of food up  into his throat/mouth.  He was evaluated by the speech therapist.  Barium swallow was performed showed minimal hiatal hernia which likely explains his reflux symptoms.  PPI was prescribed.  His condition has improved and he is deemed stable for discharge.  Case was discussed with nephrologist, Dr. Juleen Holt, via secure chat.  Patient is okay for discharge from my standpoint.  Discharge plan was discussed with the patient and his daughter (Ms. [name]) over the phone.  They understand that PPI may cause renal failure but they wish to proceed with treatment.     Medical Consultants:   Nephrologist   Discharge Exam:    Vitals:   05/31/21 1947 06/01/21 0500 06/01/21 0600 06/01/21 0836  BP: 107/70 108/66  105/66  Pulse: 62 (!) 58  64  Resp: 16 20  18   Temp: 98 F (36.7 C) 97.9 F (36.6 C)  (!) 97.5 F (36.4 C)  TempSrc: Oral Oral  Oral  SpO2: 100% 100%  100%  Weight:   71.4 kg   Height:         GEN: NAD SKIN: Warm and dry EYES: No pallor or icterus ENT: MMM CV: RRR PULM: CTA B ABD: soft, ND, NT, +BS CNS: AAO x 3, non focal EXT: Mild bilateral leg edema (L>R).  No erythema or tenderness   The results of significant diagnostics from this hospitalization (including imaging, microbiology, ancillary and laboratory) are listed below for reference.  Procedures and Diagnostic Studies:   DG Chest 2 View  Result Date: 05/29/2021 CLINICAL DATA:  Shortness of breath, pneumonia EXAM: CHEST - 2 VIEW COMPARISON:  02/07/2021 FINDINGS: Gross cardiomegaly status post median sternotomy and CABG with aortic valve prosthesis. Small, loculated appearing bilateral pleural effusions and associated atelectasis or consolidation, similar in appearance to prior examination. Mild, diffuse interstitial pulmonary opacity and pulmonary vascular prominence. IMPRESSION: 1. Small, loculated appearing bilateral pleural effusions and associated atelectasis or consolidation, similar in appearance to prior  examination. 2. Mild, diffuse interstitial pulmonary opacity and pulmonary vascular prominence, likely mild edema in the setting of cardiomegaly and similar to prior. No focal airspace opacity. Electronically Signed   By: [name] M.D.   On: 05/29/2021 18:26     Labs:   Basic Metabolic Panel: Recent Labs  Lab 05/29/21 1736 05/30/21 0140 05/31/21 0838  NA 134* 135 135  K 3.7 3.6 3.8  CL 98 99 99  CO2 22 23 24   GLUCOSE 104* 99 105*  BUN 110* 110* 95*  CREATININE 4.01* 3.88* 3.75*  CALCIUM 8.7* 8.6* 8.7*  PHOS  --   --  5.0*   GFR Estimated Creatinine Clearance: 13.8 mL/min (A) (by C-G formula based on SCr of 3.75 mg/dL (H)). Liver Function Tests: Recent Labs  Lab 05/31/21 0838  ALBUMIN 3.3*   No results for input(s): LIPASE, AMYLASE in the last 168 hours. No results for input(s): AMMONIA in the last 168 hours. Coagulation profile No results for input(s): INR, PROTIME in the last 168 hours.  CBC: Recent Labs  Lab 05/29/21 1736 05/30/21 0140 05/31/21 0838  WBC 6.5 5.7 6.2  HGB 11.4* 11.2* 11.2*  HCT 33.7* 32.8* 32.0*  MCV 92.8 92.9 93.6  PLT 122* 111* 118*   Cardiac Enzymes: No results for input(s): CKTOTAL, CKMB, CKMBINDEX, TROPONINI in the last 168 hours. BNP: Invalid input(s): POCBNP CBG: No results for input(s): GLUCAP in the last 168 hours. D-Dimer No results for input(s): DDIMER in the last 72 hours. Hgb A1c No results for input(s): HGBA1C in the last 72 hours. Lipid Profile No results for input(s): CHOL, HDL, LDLCALC, TRIG, CHOLHDL, LDLDIRECT in the last 72 hours. Thyroid function studies No results for input(s): TSH, T4TOTAL, T3FREE, THYROIDAB in the last 72 hours.  Invalid input(s): FREET3 Anemia work up No results for input(s): VITAMINB12, FOLATE, FERRITIN, TIBC, IRON, RETICCTPCT in the last 72 hours. Microbiology Recent Results (from the past 240 hour(s))  Resp Panel by RT-PCR (Flu A&B, Covid) Nasopharyngeal Swab     Status: None    Collection Time: 05/29/21  8:14 PM   Specimen: Nasopharyngeal Swab; Nasopharyngeal(NP) swabs in vial transport medium  Result Value Ref Range Status   SARS Coronavirus 2 by RT PCR NEGATIVE NEGATIVE Final    Comment: (NOTE) SARS-CoV-2 target nucleic acids are NOT DETECTED.  The SARS-CoV-2 RNA is generally detectable in upper respiratory specimens during the acute phase of infection. The lowest concentration of SARS-CoV-2 viral copies this assay can detect is 138 copies/mL. A negative result does not preclude SARS-Cov-2 infection and should not be used as the sole basis for treatment or other patient management decisions. A negative result may occur with  improper specimen collection/handling, submission of specimen other than nasopharyngeal swab, presence of viral mutation(s) within the areas targeted by this assay, and inadequate number of viral copies(<138 copies/mL). A negative result must be combined with clinical observations, patient history, and epidemiological information. The expected result is Negative.  Fact Sheet for Patients:  EntrepreneurPulse.com.au  Fact Sheet for Healthcare Providers:  IncredibleEmployment.be  This test is no t yet approved or cleared by the [place] FDA and  has been authorized for detection and/or diagnosis of SARS-CoV-2 by FDA under an Emergency Use Authorization (EUA). This EUA will remain  in effect (meaning this test can be used) for the duration of the COVID-19 declaration under Section 564(b)(1) of the Act, 21 U.S.C.section 360bbb-3(b)(1), unless the authorization is terminated  or revoked sooner.       Influenza A by PCR NEGATIVE NEGATIVE Final   Influenza B by PCR NEGATIVE NEGATIVE Final    Comment: (NOTE) The Xpert Xpress SARS-CoV-2/FLU/RSV plus assay is intended as an aid in the diagnosis of influenza from Nasopharyngeal swab specimens and should not be used as a sole basis for treatment.  Nasal washings and aspirates are unacceptable for Xpert Xpress SARS-CoV-2/FLU/RSV testing.  Fact Sheet for Patients: EntrepreneurPulse.com.au  Fact Sheet for Healthcare Providers: IncredibleEmployment.be  This test is not yet approved or cleared by the [place] FDA and has been authorized for detection and/or diagnosis of SARS-CoV-2 by FDA under an Emergency Use Authorization (EUA). This EUA will remain in effect (meaning this test can be used) for the duration of the COVID-19 declaration under Section 564(b)(1) of the Act, 21 U.S.C. section 360bbb-3(b)(1), unless the authorization is terminated or revoked.  Performed at [facility], [address] [place], [place] [address]      Discharge Instructions:   Discharge Instructions     Diet - low sodium heart healthy   Complete by: As directed    Increase activity slowly   Complete by: As directed    No wound care   Complete by: As directed       Allergies as of 06/01/2021       Reactions   Morphine Anaphylaxis   Oxycodone Other (See Comments)   Hypotension and bradycardia   Carvedilol    Dizziness and syncope    Iodinated Diagnostic Agents Rash   Other reaction(s): Asthenia (finding), Other (qualifier value) paralyzed legs Other reaction(s): RASH   Lisinopril Cough, Other (See Comments)        Medication List     STOP taking these medications    ALPRAZolam 0.25 MG tablet Commonly known as: XANAX   fluticasone 50 MCG/ACT nasal spray Commonly known as: FLONASE   potassium chloride SA 20 MEQ tablet Commonly known as: KLOR-CON       TAKE these medications    albuterol 108 (90 Base) MCG/ACT inhaler Commonly known as: VENTOLIN HFA Inhale into the lungs.   amiodarone 200 MG tablet Commonly known as: PACERONE Take 1 tablet (200 mg total) by mouth daily. What changed: when to take this   aspirin EC 81 MG tablet Take 1 tablet (81 mg total) by  mouth 2 (two) times daily. Swallow whole.   losartan 25 MG tablet Commonly known as: COZAAR Take 1 tablet (25 mg total) by mouth daily.   melatonin 3 MG Tabs tablet Take 10 mg by mouth at bedtime as needed (sleep).   omeprazole 20 MG tablet Commonly known as: PriLOSEC OTC Take 1 tablet (20 mg total) by mouth daily.   pravastatin 20 MG tablet Commonly known as: PRAVACHOL Take 20 mg by mouth every evening.   torsemide 20 MG tablet Commonly known as: DEMADEX Take 1 tablet (20 mg total) by mouth daily. What changed: how much to take   traMADol 50 MG tablet Commonly known as: ULTRAM Take 50 mg by  mouth every 4 (four) hours as needed.               Durable Medical Equipment  (From admission, onward)           Start     Ordered   05/30/21 1022  For home use only DME Other see comment  Once       Comments: OCD evaluation  Question:  Length of Need  Answer:  Lifetime   05/30/21 1021            Follow-up Information     Kolluru, Lurena Nida, MD. Schedule an appointment as soon as possible for a visit in 1 week(s).   Specialty: Nephrology Contact information: [address] [place] [address] [phone]                   If you experience worsening of your admission symptoms, develop shortness of breath, life threatening emergency, suicidal or homicidal thoughts you must seek medical attention immediately by calling 911 or calling your MD immediately  if symptoms less severe.   You must read complete instructions/literature along with all the possible adverse reactions/side effects for all the medicines you take and that have been prescribed to you. Take any new medicines after you have completely understood and accept all the possible adverse reactions/side effects.    Please note   You were cared for by a hospitalist during your hospital stay. If you have any questions about your discharge medications or the care you received  while you were in the hospital after you are discharged, you can call the unit and asked to speak with the hospitalist on call if the hospitalist that took care of you is not available. Once you are discharged, your primary care physician will handle any further medical issues. Please note that NO REFILLS for any discharge medications will be authorized once you are discharged, as it is imperative that you return to your primary care physician (or establish a relationship with a primary care physician if you do not have one) for your aftercare needs so that they can reassess your need for medications and monitor your lab values.       Time coordinating discharge: 32 minutes  Signed:  Graydon Fofana  [facility] 06/01/2021, 2:15 PM   Pager on [email or web address]. If 7PM-7AM, please contact night-coverage at [email or web address]

## 2021-06-01 NOTE — Progress Notes (Signed)
Mobility Specialist - Progress Note   06/01/21 1000  Mobility  Activity Refused mobility  Mobility performed by Mobility specialist    Pt sitting in recliner upon arrival, politely declined mobility at this time as he would like to rest today. Pt hoping to be d/c later this date. Will attempt session another date/time.    Kathee Delton Mobility Specialist 06/01/21, 10:31 AM

## 2021-06-12 DIAGNOSIS — I5023 Acute on chronic systolic (congestive) heart failure: Secondary | ICD-10-CM | POA: Diagnosis not present

## 2021-06-12 DIAGNOSIS — I509 Heart failure, unspecified: Secondary | ICD-10-CM | POA: Diagnosis not present

## 2021-06-14 ENCOUNTER — Encounter: Payer: PPO | Attending: Internal Medicine | Admitting: Internal Medicine

## 2021-06-14 ENCOUNTER — Other Ambulatory Visit: Payer: Self-pay

## 2021-06-14 DIAGNOSIS — Z833 Family history of diabetes mellitus: Secondary | ICD-10-CM | POA: Insufficient documentation

## 2021-06-14 DIAGNOSIS — I5022 Chronic systolic (congestive) heart failure: Secondary | ICD-10-CM | POA: Insufficient documentation

## 2021-06-14 DIAGNOSIS — Z885 Allergy status to narcotic agent status: Secondary | ICD-10-CM | POA: Insufficient documentation

## 2021-06-14 DIAGNOSIS — L89323 Pressure ulcer of left buttock, stage 3: Secondary | ICD-10-CM | POA: Diagnosis not present

## 2021-06-14 DIAGNOSIS — I255 Ischemic cardiomyopathy: Secondary | ICD-10-CM | POA: Insufficient documentation

## 2021-06-14 DIAGNOSIS — Z888 Allergy status to other drugs, medicaments and biological substances status: Secondary | ICD-10-CM | POA: Insufficient documentation

## 2021-06-14 DIAGNOSIS — I130 Hypertensive heart and chronic kidney disease with heart failure and stage 1 through stage 4 chronic kidney disease, or unspecified chronic kidney disease: Secondary | ICD-10-CM | POA: Diagnosis not present

## 2021-06-14 DIAGNOSIS — N184 Chronic kidney disease, stage 4 (severe): Secondary | ICD-10-CM | POA: Diagnosis not present

## 2021-06-14 DIAGNOSIS — Z87891 Personal history of nicotine dependence: Secondary | ICD-10-CM | POA: Insufficient documentation

## 2021-06-14 DIAGNOSIS — L89313 Pressure ulcer of right buttock, stage 3: Secondary | ICD-10-CM | POA: Diagnosis not present

## 2021-06-15 NOTE — Progress Notes (Signed)
[name], [name] ([id number]) Visit Report for 06/14/2021 Abuse/Suicide Risk Screen Details Patient Name: [name], [name]. Date of Service: 06/14/2021 3:00 PM Medical Record Number: [id number] Patient Account Number: [id number] Date of Birth/Sex: [birth year] (87 y.o. M) Treating RN: Carlene Coria Primary Care Wyeth Hoffer: Harrel Lemon Other Clinician: Referring Tayon Parekh: Referral, Self Treating Cylan Borum/Extender: Yaakov Guthrie in Treatment: 0 Abuse/Suicide Risk Screen Items Answer ABUSE RISK SCREEN: Has anyone close to you tried to hurt or harm you recentlyo No Do you feel uncomfortable with anyone in your familyo No Has anyone forced you do things that you didnot want to doo No Electronic Signature(s) Signed: 06/15/2021 5:08:20 PM By: Carlene Coria RN Entered By: Carlene Coria on 06/14/2021 15:37:52 Bastidas, [name] ([id number]) -------------------------------------------------------------------------------- Activities of Daily Living Details Patient Name: James Prude T. Date of Service: 06/14/2021 3:00 PM Medical Record Number: [id number] Patient Account Number: [id number] Date of Birth/Sex: 28-Jun-1931 (87 y.o. M) Treating RN: Carlene Coria Primary Care Ahnesty Finfrock: Harrel Lemon Other Clinician: Referring Katie Moch: Referral, Self Treating Sang Blount/Extender: Yaakov Guthrie in Treatment: 0 Activities of Daily Living Items Answer Activities of Daily Living (Please select one for each item) Drive Automobile Not Able Take Medications Need Assistance Use Telephone Need Assistance Care for Appearance Need Assistance Use Toilet Need Assistance Bath / Shower Need Assistance Dress Self Need Assistance Feed Self Not Able Walk Need Assistance Get In / Out Bed Need Assistance Housework Not Able Prepare Meals Not Able Handle Money Not Able Shop for Self Not Able Electronic Signature(s) Signed: 06/15/2021 5:08:20 PM By: Carlene Coria RN Entered By: Carlene Coria on  06/14/2021 15:38:46 Lykens, [name] ([id number]) -------------------------------------------------------------------------------- Education Screening Details Patient Name: James Prude T. Date of Service: 06/14/2021 3:00 PM Medical Record Number: [id number] Patient Account Number: [id number] Date of Birth/Sex: 02-19-1931 (87 y.o. M) Treating RN: Carlene Coria Primary Care Milford Cilento: Harrel Lemon Other Clinician: Referring Eain Mullendore: Referral, Self Treating Miguel Christiana/Extender: Yaakov Guthrie in Treatment: 0 Primary Learner Assessed: Patient Learning Preferences/Education Level/Primary Language Learning Preference: Explanation Highest Education Level: High School Preferred Language: English Cognitive Barrier Language Barrier: No Translator Needed: No Memory Deficit: No Emotional Barrier: No Cultural/Religious Beliefs Affecting Medical Care: No Physical Barrier Impaired Vision: Yes Glasses Impaired Hearing: No Decreased Hand dexterity: No Knowledge/Comprehension Knowledge Level: Medium Comprehension Level: High Ability to understand written instructions: High Ability to understand verbal instructions: High Motivation Anxiety Level: Anxious Cooperation: Cooperative Education Importance: Acknowledges Need Interest in Health Problems: Asks Questions Perception: Coherent Willingness to Engage in Self-Management High Activities: Readiness to Engage in Self-Management High Activities: Electronic Signature(s) Signed: 06/15/2021 5:08:20 PM By: Carlene Coria RN Entered By: Carlene Coria on 06/14/2021 15:39:25 Aird, [name] ([id number]) -------------------------------------------------------------------------------- Fall Risk Assessment Details Patient Name: James Prude T. Date of Service: 06/14/2021 3:00 PM Medical Record Number: [id number] Patient Account Number: [id number] Date of Birth/Sex: January 11, 1931 (87 y.o. M) Treating RN: Carlene Coria Primary Care Shironda Kain:  Harrel Lemon Other Clinician: Referring Reid Nawrot: Referral, Self Treating Idris Edmundson/Extender: Yaakov Guthrie in Treatment: 0 Fall Risk Assessment Items Have you had 2 or more falls in the last 12 monthso 0 No Have you had any fall that resulted in injury in the last 12 monthso 0 No FALLS RISK SCREEN History of falling - immediate or within 3 months 0 No Secondary diagnosis (Do you have 2 or more medical diagnoseso) 0 No Ambulatory aid None/bed rest/wheelchair/nurse 0 No Crutches/cane/walker 0 No Furniture 0 No Intravenous therapy Access/Saline/Heparin Lock 0 No Gait/Transferring Normal/ bed rest/ wheelchair 0 No Weak (short steps with  or without shuffle, stooped but able to lift head while walking, may 0 No seek support from furniture) Impaired (short steps with shuffle, may have difficulty arising from chair, head down, impaired 0 No balance) Mental Status Oriented to own ability 0 No Electronic Signature(s) Signed: 06/15/2021 5:08:20 PM By: Carlene Coria RN Entered By: Carlene Coria on 06/14/2021 15:39:32 Ingrum, [name] ([id number]) -------------------------------------------------------------------------------- Foot Assessment Details Patient Name: James Prude T. Date of Service: 06/14/2021 3:00 PM Medical Record Number: [id number] Patient Account Number: [id number] Date of Birth/Sex: 05/13/1931 (87 y.o. M) Treating RN: Carlene Coria Primary Care Spiro Ausborn: Harrel Lemon Other Clinician: Referring Zula Hovsepian: Referral, Self Treating Delman Goshorn/Extender: Yaakov Guthrie in Treatment: 0 Foot Assessment Items Site Locations + = Sensation present, - = Sensation absent, C = Callus, U = Ulcer R = Redness, W = Warmth, M = Maceration, PU = Pre-ulcerative lesion F = Fissure, S = Swelling, D = Dryness Assessment Right: Left: Other Deformity: No No Prior Foot Ulcer: No No Prior Amputation: No No Charcot Joint: No No Ambulatory Status: Ambulatory With  Help Assistance Device: Wheelchair Gait: Steady Electronic Signature(s) Signed: 06/15/2021 5:08:20 PM By: Carlene Coria RN Entered By: Carlene Coria on 06/14/2021 15:40:48 Eckersley, [name] ([id number]) -------------------------------------------------------------------------------- Nutrition Risk Screening Details Patient Name: James Prude T. Date of Service: 06/14/2021 3:00 PM Medical Record Number: [id number] Patient Account Number: [id number] Date of Birth/Sex: 09/25/1930 (87 y.o. M) Treating RN: Carlene Coria Primary Care Biff Rutigliano: Harrel Lemon Other Clinician: Referring Chloris Marcoux: Referral, Self Treating Joia Doyle/Extender: Yaakov Guthrie in Treatment: 0 Height (in): 71 Weight (lbs): 154 Body Mass Index (BMI): 21.5 Nutrition Risk Screening Items Score Screening NUTRITION RISK SCREEN: I have an illness or condition that made me change the kind and/or amount of food I eat 0 No I eat fewer than two meals per day 0 No I eat few fruits and vegetables, or milk products 0 No I have three or more drinks of beer, liquor or wine almost every day 0 No I have tooth or mouth problems that make it hard for me to eat 0 No I don't always have enough money to buy the food I need 0 No I eat alone most of the time 0 No I take three or more different prescribed or over-the-counter drugs a day 1 Yes Without wanting to, I have lost or gained 10 pounds in the last six months 0 No I am not always physically able to shop, cook and/or feed myself 2 Yes Nutrition Protocols Good Risk Protocol Moderate Risk Protocol 0 Provide education on nutrition High Risk Proctocol Risk Level: Moderate Risk Score: 3 Electronic Signature(s) Signed: 06/15/2021 5:08:20 PM By: Carlene Coria RN Entered By: Carlene Coria on 06/14/2021 15:40:01

## 2021-06-15 NOTE — Progress Notes (Signed)
OLAND, ARQUETTE ([id number]) Visit Report for 06/14/2021 Allergy List Details Patient Name: [name], [name]. Date of Service: 06/14/2021 3:00 PM Medical Record Number: [id number] Patient Account Number: [id number] Date of Birth/Sex: Feb 16, 1931 (87 y.o. M) Treating RN: Carlene Coria Primary Care Zenaya Ulatowski: Harrel Lemon Other Clinician: Referring Arliss Hepburn: Referral, Self Treating Kieara Schwark/Extender: [name]: 0 Allergies Active Allergies morphine Reaction: anaphylaxis Severity: Severe oxycodone carvedilol Reaction: dizziness Iodinated Contrast Media Reaction: rash lisinopril Reaction: cough Severity: Mild Allergy Notes Electronic Signature(s) Signed: 06/15/2021 5:08:20 PM By: Carlene Coria RN Entered By: Carlene Coria on 06/14/2021 15:36:24 Errington, [name] ([id number]) -------------------------------------------------------------------------------- Arrival Information Details Patient Name: James Prude T. Date of Service: 06/14/2021 3:00 PM Medical Record Number: [id number] Patient Account Number: [id number] Date of Birth/Sex: 07-25-1931 (87 y.o. M) Treating RN: Carlene Coria Primary Care Shawnita Krizek: Harrel Lemon Other Clinician: Referring Jameria Bradway: Referral, Self Treating Tammey Deeg/Extender: [name]: 0 Visit Information Patient Arrived: Wheel Chair Arrival Time: 15:33 Accompanied By: daughter Transfer Assistance: Manual Patient Identification Verified: Yes Secondary Verification Process Completed: Yes Patient Requires Transmission-Based Precautions: No Patient Has Alerts: No History Since Last Visit All ordered tests and consults were completed: No Added or deleted any medications: No Any new allergies or adverse reactions: No Had a fall or experienced change in activities of daily living that may affect risk of falls: No Signs or symptoms of abuse/neglect since last visito No Hospitalized since last visit:  No Implantable device outside of the clinic excluding cellular tissue based products placed in the center since last visit: No Has Dressing in Place as Prescribed: Yes Electronic Signature(s) Signed: 06/15/2021 5:08:20 PM By: Carlene Coria RN Entered By: Carlene Coria on 06/14/2021 15:34:26 Piech, [name] ([id number]) -------------------------------------------------------------------------------- Clinic Level of Care Assessment Details Patient Name: James Prude T. Date of Service: 06/14/2021 3:00 PM Medical Record Number: [id number] Patient Account Number: [id number] Date of Birth/Sex: 06/25/1931 (87 y.o. M) Treating RN: Carlene Coria Primary Care Giordano Getman: Harrel Lemon Other Clinician: Referring Maryalyce Sanjuan: Referral, Self Treating Hau Sanor/Extender: [name]: 0 Clinic Level of Care Assessment Items TOOL 2 Quantity Score X - Use when only an EandM is performed on the INITIAL visit 1 0 ASSESSMENTS - Nursing Assessment / Reassessment X - General Physical Exam (combine w/ comprehensive assessment (listed just below) when performed on new 1 20 pt. evals) X- 1 25 Comprehensive Assessment (HX, ROS, Risk Assessments, Wounds Hx, etc.) ASSESSMENTS - Wound and Skin Assessment / Reassessment []  - Simple Wound Assessment / Reassessment - one wound 0 X- 2 5 Complex Wound Assessment / Reassessment - multiple wounds []  - 0 Dermatologic / Skin Assessment (not related to wound area) ASSESSMENTS - Ostomy and/or Continence Assessment and Care []  - Incontinence Assessment and Management 0 []  - 0 Ostomy Care Assessment and Management (repouching, etc.) PROCESS - Coordination of Care X - Simple Patient / Family Education for ongoing care 1 15 []  - 0 Complex (extensive) Patient / Family Education for ongoing care []  - 0 Staff obtains Programmer, systems, Records, Test Results / Process Orders []  - 0 Staff telephones HHA, Nursing Homes / Clarify orders / etc []  - 0 Routine  Transfer to another Facility (non-emergent condition) []  - 0 Routine Hospital Admission (non-emergent condition) X- 1 15 New Admissions / Biomedical engineer / Ordering NPWT, Apligraf, etc. []  - 0 Emergency Hospital Admission (emergent condition) X- 1 10 Simple Discharge Coordination []  - 0 Complex (extensive) Discharge Coordination PROCESS - Special Needs []  - Pediatric / Minor Patient Management  0 []  - 0 Isolation Patient Management []  - 0 Hearing / Language / Visual special needs []  - 0 Assessment of Community assistance (transportation, D/C planning, etc.) []  - 0 Additional assistance / Altered mentation []  - 0 Support Surface(s) Assessment (bed, cushion, seat, etc.) INTERVENTIONS - Wound Cleansing / Measurement X - Wound Imaging (photographs - any number of wounds) 1 5 []  - 0 Wound Tracing (instead of photographs) []  - 0 Simple Wound Measurement - one wound X- 2 5 Complex Wound Measurement - multiple wounds [name], James T. ([id number]) []  - 0 Simple Wound Cleansing - one wound X- 2 5 Complex Wound Cleansing - multiple wounds INTERVENTIONS - Wound Dressings X - Small Wound Dressing one or multiple wounds 2 10 []  - 0 Medium Wound Dressing one or multiple wounds []  - 0 Large Wound Dressing one or multiple wounds []  - 0 Application of Medications - injection INTERVENTIONS - Miscellaneous []  - External ear exam 0 []  - 0 Specimen Collection (cultures, biopsies, blood, body fluids, etc.) []  - 0 Specimen(s) / Culture(s) sent or taken to Lab for analysis []  - 0 Patient Transfer (multiple staff / Civil Service fast streamer / Similar devices) []  - 0 Simple Staple / Suture removal (25 or less) []  - 0 Complex Staple / Suture removal (26 or more) []  - 0 Hypo / Hyperglycemic Management (close monitor of Blood Glucose) []  - 0 Ankle / Brachial Index (ABI) - do not check if billed separately Has the patient been seen at the hospital within the last three years: Yes Total Score:  140 Level Of Care: New/Established - Level 4 Electronic Signature(s) Signed: 06/15/2021 5:08:20 PM By: Carlene Coria RN Entered By: Carlene Coria on 06/14/2021 16:17:23 Volkert, [name] ([id number]) -------------------------------------------------------------------------------- Encounter Discharge Information Details Patient Name: James Prude T. Date of Service: 06/14/2021 3:00 PM Medical Record Number: [id number] Patient Account Number: [id number] Date of Birth/Sex: [birth year] (87 y.o. M) Treating RN: Carlene Coria Primary Care Petrina Melby: Harrel Lemon Other Clinician: Referring Erikah Thumm: Referral, Self Treating Aarin Bluett/Extender: [name]: 0 Encounter Discharge Information Items Post Procedure Vitals Discharge Condition: Stable Temperature (F): 98.6 Ambulatory Status: Wheelchair Pulse (bpm): 66 Discharge Destination: Home Respiratory Rate (breaths/min): 18 Transportation: Private Auto Blood Pressure (mmHg): 101/65 Accompanied By: daughter Schedule Follow-up Appointment: Yes Clinical Summary of Care: Patient Declined Electronic Signature(s) Signed: 06/14/2021 4:18:53 PM By: Carlene Coria RN Entered By: Carlene Coria on 06/14/2021 16:18:53 Spainhour, [name] ([id number]) -------------------------------------------------------------------------------- Lower Extremity Assessment Details Patient Name: James Prude T. Date of Service: 06/14/2021 3:00 PM Medical Record Number: [id number] Patient Account Number: [id number] Date of Birth/Sex: 06/28/1931 (87 y.o. M) Treating RN: Carlene Coria Primary Care Arial Galligan: Harrel Lemon Other Clinician: Referring Emma Birchler: Referral, Self Treating Nili Honda/Extender: [name]: 0 Electronic Signature(s) Signed: 06/15/2021 5:08:20 PM By: Carlene Coria RN Entered By: Carlene Coria on 06/14/2021 15:36:18 Verdi, [name]  ([id number]) -------------------------------------------------------------------------------- Multi Wound Chart Details Patient Name: James Prude T. Date of Service: 06/14/2021 3:00 PM Medical Record Number: [id number] Patient Account Number: [id number] Date of Birth/Sex: 1930-10-12 (87 y.o. M) Treating RN: Carlene Coria Primary Care Shiasia Porro: Harrel Lemon Other Clinician: Referring Radiance Deady: Referral, Self Treating Fadia Marlar/Extender: [name]: 0 Vital Signs Height(in): 71 Pulse(bpm): 42 Weight(lbs): 154 Blood Pressure(mmHg): 101/65 Body Mass Index(BMI): 21 Temperature(F): 98.6 Respiratory Rate(breaths/min): 18 Photos: [N/A:N/A] Wound Location: Right Gluteus Left Gluteus N/A Wounding Event: Gradually Appeared Gradually Appeared N/A Primary Etiology: Pressure Ulcer Pressure Ulcer N/A Comorbid History: Coronary Artery Disease, Coronary Artery Disease, N/A Hypertension,  History of pressure Hypertension, History of pressure wounds wounds Date Acquired: 05/18/2021 05/18/2021 N/A Weeks of Treatment: 0 0 N/A Wound Status: Open Open N/A Measurements L x W x D (cm) 0.4x0.3x0.1 1x0.4x0.1 N/A Area (cm) : 0.094 0.314 N/A Volume (cm) : 0.009 0.031 N/A % Reduction in Area: 0.00% 0.00% N/A % Reduction in Volume: 0.00% 0.00% N/A Classification: Category/Stage III Category/Stage III N/A Exudate Amount: Medium Medium N/A Exudate Type: Serosanguineous Serosanguineous N/A Exudate Color: red, brown red, brown N/A Granulation Amount: Large (67-100%) Large (67-100%) N/A Granulation Quality: Red Red N/A Necrotic Amount: Small (1-33%) Small (1-33%) N/A Exposed Structures: Fat Layer (Subcutaneous Tissue): Fat Layer (Subcutaneous Tissue): N/A Yes Yes Fascia: No Fascia: No Tendon: No Tendon: No Muscle: No Muscle: No Joint: No Joint: No Bone: No Bone: No Epithelialization: None None N/A Debridement: Chemical/Enzymatic/Mechanical Chemical/Enzymatic/Mechanical  N/A Pre-procedure Verification/Time 15:38 15:38 N/A Out Taken: Instrument: Other(saline and gauze) Other(saline and gauze) N/A Bleeding: None None N/A Procedural Pain: 0 0 N/A Post Procedural Pain: 0 0 N/A Debridement Treatment Procedure was tolerated well Procedure was tolerated well N/A Response: Post Debridement 0.4x0.3x0.1 1x0.4x0.1 N/A Measurements L x W x D (cm) Post Debridement Volume: 0.009 0.031 N/A (cm) Post Debridement Stage: Category/Stage III Category/Stage III N/A [name], [name] ([id number]) Procedures Performed: Debridement Debridement N/A Treatment Notes Electronic Signature(s) Signed: 06/14/2021 4:22:13 PM By: Kalman Shan DO Entered By: Kalman Shan on 06/14/2021 16:17:25 Lasser, [name] ([id number]) -------------------------------------------------------------------------------- [place] Plan Details Patient Name: James Prude T. Date of Service: 06/14/2021 3:00 PM Medical Record Number: [id number] Patient Account Number: [id number] Date of Birth/Sex: 07/29/1931 (87 y.o. M) Treating RN: Carlene Coria Primary Care Ettore Trebilcock: Harrel Lemon Other Clinician: Referring Regnia Mathwig: Referral, Self Treating Demarian Epps/Extender: [name]: 0 Active Inactive Wound/Skin Impairment Nursing Diagnoses: Knowledge deficit related to ulceration/compromised skin integrity Goals: Patient/caregiver will verbalize understanding of skin care regimen Date Initiated: 06/14/2021 Target Resolution Date: 07/14/2021 Goal Status: Active Ulcer/skin breakdown will have a volume reduction of 30% by week 4 Date Initiated: 06/14/2021 Target Resolution Date: 07/14/2021 Goal Status: Active Ulcer/skin breakdown will have a volume reduction of 50% by week 8 Date Initiated: 06/14/2021 Target Resolution Date: 08/14/2021 Goal Status: Active Ulcer/skin breakdown will have a volume reduction of 80% by week 12 Date Initiated: 06/14/2021 Target  Resolution Date: 09/13/2021 Goal Status: Active Ulcer/skin breakdown will heal within 14 weeks Date Initiated: 06/14/2021 Target Resolution Date: 10/14/2021 Goal Status: Active Interventions: Assess patient/caregiver ability to obtain necessary supplies Assess patient/caregiver ability to perform ulcer/skin care regimen upon admission and as needed Assess ulceration(s) every visit Notes: Electronic Signature(s) Signed: 06/15/2021 5:08:20 PM By: Carlene Coria RN Entered By: Carlene Coria on 06/14/2021 15:56:47 [place], [name] ([id number]) -------------------------------------------------------------------------------- Pain Assessment Details Patient Name: James Prude T. Date of Service: 06/14/2021 3:00 PM Medical Record Number: [id number] Patient Account Number: [id number] Date of Birth/Sex: Aug 15, 1931 (87 y.o. M) Treating RN: Carlene Coria Primary Care Jerame Hedding: Harrel Lemon Other Clinician: Referring Terran Hollenkamp: Referral, Self Treating Jessenya Berdan/Extender: [name]: 0 Active Problems Location of Pain Severity and Description of Pain Patient Has Paino No Site Locations Pain Management and Medication Current Pain Management: Electronic Signature(s) Signed: 06/15/2021 5:08:20 PM By: Carlene Coria RN Entered By: Carlene Coria on 06/14/2021 15:34:41 Sanden, [name] ([id number]) -------------------------------------------------------------------------------- Patient/Caregiver Education Details Patient Name: James Prude T. Date of Service: 06/14/2021 3:00 PM Medical Record Number: [id number] Patient Account Number: [id number] Date of Birth/Gender: 10-Mar-1931 (87 y.o. M) Treating RN: Carlene Coria Primary Care Physician: Harrel Lemon Other  Clinician: Referring Physician: Referral, Self Treating Physician/Extender: [name]: 0 Education Assessment Education Provided To: Patient Education Topics Provided Wound/Skin  Impairment: Methods: Explain/Verbal Responses: State content correctly Electronic Signature(s) Signed: 06/15/2021 5:08:20 PM By: Carlene Coria RN Entered By: Carlene Coria on 06/14/2021 16:17:48 Dep[place], [name] ([id number]) -------------------------------------------------------------------------------- Wound Assessment Details Patient Name: James Prude T. Date of Service: 06/14/2021 3:00 PM Medical Record Number: [id number] Patient Account Number: [id number] Date of Birth/Sex: 12-Jan-1931 (87 y.o. M) Treating RN: Carlene Coria Primary Care Devaun Hernandez: Harrel Lemon Other Clinician: Referring Hanz Winterhalter: Referral, Self Treating Bettyjean Stefanski/Extender: [name]: 0 Wound Status Wound Number: 5 Primary Pressure Ulcer Etiology: Wound Location: Right Gluteus Wound Status: Open Wounding Event: Gradually Appeared Comorbid Coronary Artery Disease, Hypertension, History of Date Acquired: 05/18/2021 History: pressure wounds Weeks Of Treatment: 0 Clustered Wound: No Photos Wound Measurements Length: (cm) 0.4 Width: (cm) 0.3 Depth: (cm) 0.1 Area: (cm) 0.094 Volume: (cm) 0.009 % Reduction in Area: 0% % Reduction in Volume: 0% Epithelialization: None Tunneling: No Undermining: No Wound Description Classification: Category/Stage III Exudate Amount: Medium Exudate Type: Serosanguineous Exudate Color: red, brown Foul Odor After Cleansing: No Slough/Fibrino Yes Wound Bed Granulation Amount: Large (67-100%) Exposed Structure Granulation Quality: Red Fascia Exposed: No Necrotic Amount: Small (1-33%) Fat Layer (Subcutaneous Tissue) Exposed: Yes Necrotic Quality: Adherent Slough Tendon Exposed: No Muscle Exposed: No Joint Exposed: No Bone Exposed: No Treatment Notes Wound #5 (Gluteus) Wound Laterality: Right Cleanser Byram Ancillary Kit - 15 Day Supply Discharge Instruction: Use supplies as instructed; Kit contains: (15) Saline Bullets; (15) 3x3 Gauze; 15 pr  Gloves Normal Saline Discharge Instruction: Wash your hands with soap and water. Remove old dressing, discard into plastic bag and place into trash. Cleanse the wound with Normal Saline prior to applying a clean dressing using gauze sponges, not tissues or cotton balls. Do not [place], James T. ([id number]) scrub or use excessive force. Pat dry using gauze sponges, not tissue or cotton balls. Peri-Wound Care Topical Primary Dressing Prisma 4.34 (in) Discharge Instruction: Moisten w/normal saline or sterile water; Cover wound as directed. Do not remove from wound bed. Secondary Dressing Zetuvit Plus Silicone Border Dressing 5x5 (in/in) Secured With Compression Wrap Compression Stockings Add-Ons Electronic Signature(s) Signed: 06/14/2021 4:12:19 PM By: Carlene Coria RN Entered By: Carlene Coria on 06/14/2021 16:12:18 [name], [name] ([id number]) -------------------------------------------------------------------------------- Wound Assessment Details Patient Name: James Prude T. Date of Service: 06/14/2021 3:00 PM Medical Record Number: [id number] Patient Account Number: [id number] Date of Birth/Sex: November 29, 1930 (87 y.o. M) Treating RN: Carlene Coria Primary Care Lindsey Demonte: Harrel Lemon Other Clinician: Referring Cortlandt Capuano: Referral, Self Treating Chelan Heringer/Extender: [name]: 0 Wound Status Wound Number: 6 Primary Pressure Ulcer Etiology: Wound Location: Left Gluteus Wound Status: Open Wounding Event: Gradually Appeared Comorbid Coronary Artery Disease, Hypertension, History of Date Acquired: 05/18/2021 History: pressure wounds Weeks Of Treatment: 0 Clustered Wound: No Photos Wound Measurements Length: (cm) 1 Width: (cm) 0.4 Depth: (cm) 0.1 Area: (cm) 0.314 Volume: (cm) 0.031 % Reduction in Area: 0% % Reduction in Volume: 0% Epithelialization: None Tunneling: No Undermining: No Wound Description Classification: Category/Stage III Exudate  Amount: Medium Exudate Type: Serosanguineous Exudate Color: red, brown Foul Odor After Cleansing: No Slough/Fibrino Yes Wound Bed Granulation Amount: Large (67-100%) Exposed Structure Granulation Quality: Red Fascia Exposed: No Necrotic Amount: Small (1-33%) Fat Layer (Subcutaneous Tissue) Exposed: Yes Necrotic Quality: Adherent Slough Tendon Exposed: No Muscle Exposed: No Joint Exposed: No Bone Exposed: No Treatment Notes Wound #6 (Gluteus) Wound Laterality: Left Cleanser  Byram Ancillary Kit - 15 Day Supply Discharge Instruction: Use supplies as instructed; Kit contains: (15) Saline Bullets; (15) 3x3 Gauze; 15 pr Gloves Normal Saline Discharge Instruction: Wash your hands with soap and water. Remove old dressing, discard into plastic bag and place into trash. Cleanse the wound with Normal Saline prior to applying a clean dressing using gauze sponges, not tissues or cotton balls. Do not [name], James T. ([id number]) scrub or use excessive force. Pat dry using gauze sponges, not tissue or cotton balls. Peri-Wound Care Topical Primary Dressing Prisma 4.34 (in) Discharge Instruction: Moisten w/normal saline or sterile water; Cover wound as directed. Do not remove from wound bed. Secondary Dressing Zetuvit Plus Silicone Border Dressing 5x5 (in/in) Secured With Compression Wrap Compression Stockings Add-Ons Electronic Signature(s) Signed: 06/14/2021 4:12:36 PM By: Carlene Coria RN Entered By: Carlene Coria on 06/14/2021 16:12:36 [name], [name] ([id number]) -------------------------------------------------------------------------------- Vitals Details Patient Name: James Prude T. Date of Service: 06/14/2021 3:00 PM Medical Record Number: [id number] Patient Account Number: [id number] Date of Birth/Sex: 1931/08/26 (87 y.o. M) Treating RN: Carlene Coria Primary Care Eupha Lobb: Harrel Lemon Other Clinician: Referring Ashir Kunz: Referral, Self Treating Johnye Kist/Extender: [name]: 0 Vital Signs Time Taken: 15:34 Temperature (F): 98.6 Height (in): 71 Pulse (bpm): 66 Source: Stated Respiratory Rate (breaths/min): 18 Weight (lbs): 154 Blood Pressure (mmHg): 101/65 Source: Stated Reference Range: 80 - 120 mg / dl Body Mass Index (BMI): 21.5 Electronic Signature(s) Signed: 06/15/2021 5:08:20 PM By: Carlene Coria RN Entered By: Carlene Coria on 06/14/2021 15:35:32

## 2021-06-15 NOTE — Progress Notes (Signed)
[name] ([id number]) Visit Report for 06/14/2021 Chief Complaint Document Details Patient Name: [name], [name]. Date of Service: 06/14/2021 3:00 PM Medical Record Number: [id number] Patient Account Number: [id number] Date of Birth/Sex: July 02, 1931 (87 y.o. M) Treating RN: Carlene Coria Primary Care Provider: Harrel Lemon Other Clinician: Referring Provider: Referral, Self Treating Provider/Extender: Yaakov Guthrie in Treatment: 0 Information Obtained from: Patient Chief Complaint 06/14/2021; patient is here for review of wounds on his bilateral buttocks surrounding his coccyx Electronic Signature(s) Signed: 06/14/2021 4:22:13 PM By: Kalman Shan DO Entered By: Kalman Shan on 06/14/2021 16:17:53 Traweek, [name] ([id number]) -------------------------------------------------------------------------------- Debridement Details Patient Name: James Prude T. Date of Service: 06/14/2021 3:00 PM Medical Record Number: [id number] Patient Account Number: [id number] Date of Birth/Sex: [birth year] (87 y.o. M) Treating RN: Carlene Coria Primary Care Provider: Harrel Lemon Other Clinician: Referring Provider: Referral, Self Treating Provider/Extender: Yaakov Guthrie in Treatment: 0 Debridement Performed for Wound #5 Right Gluteus Assessment: Performed By: Physician Kalman Shan, MD Debridement Type: Chemical/Enzymatic/Mechanical Agent Used: gauze and saline Level of Consciousness (Pre- Awake and Alert procedure): Pre-procedure Verification/Time Out Yes - 15:38 Taken: Start Time: 15:38 Instrument: Other : saline and gauze Bleeding: None End Time: 16:00 Procedural Pain: 0 Post Procedural Pain: 0 Response to Treatment: Procedure was tolerated well Level of Consciousness (Post- Awake and Alert procedure): Post Debridement Measurements of Total Wound Length: (cm) 0.4 Stage: Category/Stage III Width: (cm) 0.3 Depth: (cm) 0.1 Volume: (cm)  0.009 Character of Wound/Ulcer Post Debridement: Improved Post Procedure Diagnosis Same as Pre-procedure Electronic Signature(s) Signed: 06/14/2021 4:22:13 PM By: Kalman Shan DO Signed: 06/15/2021 5:08:20 PM By: Carlene Coria RN Entered By: Carlene Coria on 06/14/2021 16:00:43 Mcwilliams, [name] ([id number]) -------------------------------------------------------------------------------- Debridement Details Patient Name: James Prude T. Date of Service: 06/14/2021 3:00 PM Medical Record Number: [id number] Patient Account Number: [id number] Date of Birth/Sex: 07-Jan-1931 (87 y.o. M) Treating RN: Carlene Coria Primary Care Provider: Harrel Lemon Other Clinician: Referring Provider: Referral, Self Treating Provider/Extender: Yaakov Guthrie in Treatment: 0 Debridement Performed for Wound #6 Left Gluteus Assessment: Performed By: Physician Kalman Shan, MD Debridement Type: Chemical/Enzymatic/Mechanical Agent Used: gauze and saline Level of Consciousness (Pre- Awake and Alert procedure): Pre-procedure Verification/Time Out Yes - 15:38 Taken: Start Time: 15:38 Instrument: Other : saline and gauze Bleeding: None End Time: 16:00 Procedural Pain: 0 Post Procedural Pain: 0 Response to Treatment: Procedure was tolerated well Level of Consciousness (Post- Awake and Alert procedure): Post Debridement Measurements of Total Wound Length: (cm) 1 Stage: Category/Stage III Width: (cm) 0.4 Depth: (cm) 0.1 Volume: (cm) 0.031 Character of Wound/Ulcer Post Debridement: Improved Post Procedure Diagnosis Same as Pre-procedure Electronic Signature(s) Signed: 06/14/2021 4:22:13 PM By: Kalman Shan DO Signed: 06/15/2021 5:08:20 PM By: Carlene Coria RN Entered By: Carlene Coria on 06/14/2021 16:01:09 Winne, [name] ([id number]) -------------------------------------------------------------------------------- HPI Details Patient Name: James Prude T. Date of Service:  06/14/2021 3:00 PM Medical Record Number: [id number] Patient Account Number: [id number] Date of Birth/Sex: 04-09-1931 (87 y.o. M) Treating RN: Carlene Coria Primary Care Provider: Harrel Lemon Other Clinician: Referring Provider: Referral, Self Treating Provider/Extender: Yaakov Guthrie in Treatment: 0 History of Present Illness HPI Description: Admission 06/14/2021 [name] is an 87 year old male with a past medical history of CKD stage IV, chronic systolic heart failure, Ischemic cardiomyopathy and recurrent pressure ulcers of his sacrum that presents to the clinic for bilateral buttocks wounds. He was recently hospitalized for acute on chronic systolic congestive heart failure from 9/12 to 06/01/2021. He subsequently developed pressure ulcers to his  buttocks. This is not an uncommon history for the patient as he often has recurrence of sacral and buttocks wounds after hospitalizations. He has been using collagen from previous clinic admissions to the wound beds. He reports improvement in wound healing with this dressing. He currently denies signs of infection. Electronic Signature(s) Signed: 06/14/2021 4:22:13 PM By: Kalman Shan DO Entered By: Kalman Shan on 06/14/2021 16:19:36 Prigmore, [name] ([id number]) -------------------------------------------------------------------------------- Physical Exam Details Patient Name: James Prude T. Date of Service: 06/14/2021 3:00 PM Medical Record Number: [id number] Patient Account Number: [id number] Date of Birth/Sex: [birth year] (87 y.o. M) Treating RN: Carlene Coria Primary Care Provider: Harrel Lemon Other Clinician: Referring Provider: Referral, Self Treating Provider/Extender: Yaakov Guthrie in Treatment: 0 Constitutional . Psychiatric . Notes Bilateral buttocks wounds to the medial aspects with granulation tissue and nonviable tissue present. Electronic Signature(s) Signed: 06/14/2021 4:22:13 PM By:  Kalman Shan DO Entered By: Kalman Shan on 06/14/2021 16:20:09 Mcmeekin, [name] -------------------------------------------------------------------------------- Physician Orders Details Patient Name: James Prude T. Date of Service: 06/14/2021 3:00 PM Medical Record Number: [id number] Patient Account Number: [id number] Date of Birth/Sex: [birth year] (87 y.o. M) Treating RN: Carlene Coria Primary Care Provider: Harrel Lemon Other Clinician: Referring Provider: Referral, Self Treating Provider/Extender: Yaakov Guthrie in Treatment: 0 Verbal / Phone Orders: No Diagnosis Coding Follow-up Appointments o Return Appointment in 1 week. Bathing/ Shower/ Hygiene o Clean wound with Normal Saline or wound cleanser. o Wash wounds with antibacterial soap and water. Off-Loading o Turn and reposition every 2 hours - Keep pressure off of wounded areas Wound Treatment Wound #5 - Gluteus Wound Laterality: Right Cleanser: Byram Ancillary Kit - 15 Day Supply (DME) (Generic) 1 x Per Day/30 Days Discharge Instructions: Use supplies as instructed; Kit contains: (15) Saline Bullets; (15) 3x3 Gauze; 15 pr Gloves Cleanser: Normal Saline (DME) (Generic) 1 x Per Day/30 Days Discharge Instructions: Wash your hands with soap and water. Remove old dressing, discard into plastic bag and place into trash. Cleanse the wound with Normal Saline prior to applying a clean dressing using gauze sponges, not tissues or cotton balls. Do not scrub or use excessive force. Pat dry using gauze sponges, not tissue or cotton balls. Primary Dressing: Prisma 4.34 (in) (DME) (Generic) 1 x Per Day/30 Days Discharge Instructions: Moisten w/normal saline or sterile water; Cover wound as directed. Do not remove from wound bed. Secondary Dressing: Zetuvit Plus Silicone Border Dressing 5x5 (in/in) (DME) (Generic) 1 x Per Day/30 Days Wound #6 - Gluteus Wound Laterality: Left Cleanser: Byram Ancillary  Kit - 15 Day Supply (DME) (Generic) 1 x Per Day/30 Days Discharge Instructions: Use supplies as instructed; Kit contains: (15) Saline Bullets; (15) 3x3 Gauze; 15 pr Gloves Cleanser: Normal Saline (DME) (Generic) 1 x Per Day/30 Days Discharge Instructions: Wash your hands with soap and water. Remove old dressing, discard into plastic bag and place into trash. Cleanse the wound with Normal Saline prior to applying a clean dressing using gauze sponges, not tissues or cotton balls. Do not scrub or use excessive force. Pat dry using gauze sponges, not tissue or cotton balls. Primary Dressing: Prisma 4.34 (in) (DME) (Generic) 1 x Per Day/30 Days Discharge Instructions: Moisten w/normal saline or sterile water; Cover wound as directed. Do not remove from wound bed. Secondary Dressing: Zetuvit Plus Silicone Border Dressing 5x5 (in/in) (DME) (Generic) 1 x Per Day/30 Days Electronic Signature(s) Signed: 06/14/2021 4:14:51 PM By: Carlene Coria RN Signed: 06/14/2021 4:22:13 PM By: Kalman Shan DO Entered By: Carlene Coria on 06/14/2021 16:14:51  RECTOR, DEVONSHIRE ([id number]) -------------------------------------------------------------------------------- Problem List Details Patient Name: RENNIE, ROUCH. Date of Service: 06/14/2021 3:00 PM Medical Record Number: [id number] Patient Account Number: [id number] Date of Birth/Sex: 1930/10/11 (87 y.o. M) Treating RN: Carlene Coria Primary Care Provider: Harrel Lemon Other Clinician: Referring Provider: Referral, Self Treating Provider/Extender: Yaakov Guthrie in Treatment: 0 Active Problems ICD-10 Encounter Code Description Active Date MDM Diagnosis L89.313 Pressure ulcer of right buttock, stage 3 06/14/2021 No Yes L89.323 Pressure ulcer of left buttock, stage 3 06/14/2021 No Yes Inactive Problems Resolved Problems Electronic Signature(s) Signed: 06/14/2021 4:22:13 PM By: Kalman Shan DO Entered By: Kalman Shan on 06/14/2021  16:17:19 Spurrier, [name] ([id number]) -------------------------------------------------------------------------------- Progress Note Details Patient Name: James Prude T. Date of Service: 06/14/2021 3:00 PM Medical Record Number: [id number] Patient Account Number: [id number] Date of Birth/Sex: 12/31/1930 (87 y.o. M) Treating RN: Carlene Coria Primary Care Provider: Harrel Lemon Other Clinician: Referring Provider: Referral, Self Treating Provider/Extender: Yaakov Guthrie in Treatment: 0 Subjective Chief Complaint Information obtained from Patient 06/14/2021; patient is here for review of wounds on his bilateral buttocks surrounding his coccyx History of Present Illness (HPI) Admission 06/14/2021 Mr. Evelyn Moch is an 87 year old male with a past medical history of CKD stage IV, chronic systolic heart failure, Ischemic cardiomyopathy and recurrent pressure ulcers of his sacrum that presents to the clinic for bilateral buttocks wounds. He was recently hospitalized for acute on chronic systolic congestive heart failure from 9/12 to 06/01/2021. He subsequently developed pressure ulcers to his buttocks. This is not an uncommon history for the patient as he often has recurrence of sacral and buttocks wounds after hospitalizations. He has been using collagen from previous clinic admissions to the wound beds. He reports improvement in wound healing with this dressing. He currently denies signs of infection. Patient History Information obtained from Patient. Allergies morphine (Severity: Severe, Reaction: anaphylaxis), oxycodone, carvedilol (Reaction: dizziness), Iodinated Contrast Media (Reaction: rash), lisinopril (Severity: Mild, Reaction: cough) Family History Diabetes - Mother, No family history of Cancer, Heart Disease, Hereditary Spherocytosis, Hypertension, Kidney Disease, Lung Disease, Seizures, Stroke, Thyroid Problems, Tuberculosis. Social History Former smoker - quit 57  years ago, Alcohol Use - Rarely, Drug Use - No History, Caffeine Use - Daily. Medical History Eyes Denies history of Cataracts, Glaucoma, Optic Neuritis Ear/Nose/Mouth/Throat Denies history of Chronic sinus problems/congestion, Middle ear problems Hematologic/Lymphatic Denies history of Anemia, Hemophilia, Human Immunodeficiency Virus, Lymphedema, Sickle Cell Disease Respiratory Denies history of Aspiration, Asthma, Chronic Obstructive Pulmonary Disease (COPD), Pneumothorax, Sleep Apnea, Tuberculosis Cardiovascular Patient has history of Coronary Artery Disease, Hypertension Denies history of Angina, Arrhythmia, Congestive Heart Failure, Deep Vein Thrombosis, Hypotension, Myocardial Infarction, Peripheral Arterial Disease, Peripheral Venous Disease, Phlebitis, Vasculitis Gastrointestinal Denies history of Cirrhosis , Colitis, Crohn s, Hepatitis A, Hepatitis B, Hepatitis C Endocrine Denies history of Type I Diabetes, Type II Diabetes Immunological Denies history of Lupus Erythematosus, Raynaud s, Scleroderma Integumentary (Skin) Patient has history of History of pressure wounds Denies history of History of Burn Musculoskeletal Denies history of Gout, Rheumatoid Arthritis, Osteoarthritis, Osteomyelitis Neurologic Denies history of Dementia, Neuropathy, Quadriplegia, Paraplegia, Seizure Disorder Psychiatric Denies history of Anorexia/bulimia, Confinement Anxiety Medical And Surgical History Notes Genitourinary stage 3 chronic kidney disease Shambaugh, Jamerius T. ([id number]) Objective Constitutional Vitals Time Taken: 3:34 PM, Height: 71 in, Source: Stated, Weight: 154 lbs, Source: Stated, BMI: 21.5, Temperature: 98.6 F, Pulse: 66 bpm, Respiratory Rate: 18 breaths/min, Blood Pressure: 101/65 mmHg. General Notes: Bilateral buttocks wounds to the medial aspects with granulation tissue and nonviable tissue present.  Integumentary (Hair, Skin) Wound #5 status is Open. Original cause of  wound was Gradually Appeared. The date acquired was: 05/18/2021. The wound is located on the Right Gluteus. The wound measures 0.4cm length x 0.3cm width x 0.1cm depth; 0.094cm^2 area and 0.009cm^3 volume. There is Fat Layer (Subcutaneous Tissue) exposed. There is no tunneling or undermining noted. There is a medium amount of serosanguineous drainage noted. There is large (67-100%) red granulation within the wound bed. There is a small (1-33%) amount of necrotic tissue within the wound bed including Adherent Slough. Wound #6 status is Open. Original cause of wound was Gradually Appeared. The date acquired was: 05/18/2021. The wound is located on the Left Gluteus. The wound measures 1cm length x 0.4cm width x 0.1cm depth; 0.314cm^2 area and 0.031cm^3 volume. There is Fat Layer (Subcutaneous Tissue) exposed. There is no tunneling or undermining noted. There is a medium amount of serosanguineous drainage noted. There is large (67-100%) red granulation within the wound bed. There is a small (1-33%) amount of necrotic tissue within the wound bed including Adherent Slough. Assessment Active Problems ICD-10 Pressure ulcer of right buttock, stage 3 Pressure ulcer of left buttock, stage 3 Patient presents with a 2-week history of nonhealing wounds to his buttocks bilaterally. He has been using collagen with benefit. I recommended continuing this. If no improvement over time he may benefit from [facility].. No signs of infection on exam. We discussed aggressive offloading. Procedures Wound #5 Pre-procedure diagnosis of Wound #5 is a Pressure Ulcer located on the Right Gluteus . There was a Chemical/Enzymatic/Mechanical debridement performed by Kalman Shan, MD. With the following instrument(s): saline and gauze. Other agent used was gauze and saline. A time out was conducted at 15:38, prior to the start of the procedure. There was no bleeding. The procedure was tolerated well with a pain level of 0  throughout and a pain level of 0 following the procedure. Post Debridement Measurements: 0.4cm length x 0.3cm width x 0.1cm depth; 0.009cm^3 volume. Post debridement Stage noted as Category/Stage III. Character of Wound/Ulcer Post Debridement is improved. Post procedure Diagnosis Wound #5: Same as Pre-Procedure Wound #6 Pre-procedure diagnosis of Wound #6 is a Pressure Ulcer located on the Left Gluteus . There was a Chemical/Enzymatic/Mechanical debridement performed by Kalman Shan, MD. With the following instrument(s): saline and gauze. Other agent used was gauze and saline. A time out was conducted at 15:38, prior to the start of the procedure. There was no bleeding. The procedure was tolerated well with a pain level of 0 throughout and a pain level of 0 following the procedure. Post Debridement Measurements: 1cm length x 0.4cm width x 0.1cm depth; 0.031cm^3 volume. Post debridement Stage noted as Category/Stage III. Character of Wound/Ulcer Post Debridement is improved. Post procedure Diagnosis Wound #6: Same as Pre-Procedure Conover, Adonte T. ([id number]) Plan Follow-up Appointments: Return Appointment in 1 week. Bathing/ Shower/ Hygiene: Clean wound with Normal Saline or wound cleanser. Wash wounds with antibacterial soap and water. Off-Loading: Turn and reposition every 2 hours - Keep pressure off of wounded areas WOUND #5: - Gluteus Wound Laterality: Right Cleanser: Byram Ancillary Kit - 15 Day Supply (DME) (Generic) 1 x Per Day/30 Days Discharge Instructions: Use supplies as instructed; Kit contains: (15) Saline Bullets; (15) 3x3 Gauze; 15 pr Gloves Cleanser: Normal Saline (DME) (Generic) 1 x Per Day/30 Days Discharge Instructions: Wash your hands with soap and water. Remove old dressing, discard into plastic bag and place into trash. Cleanse the wound with Normal Saline prior to  applying a clean dressing using gauze sponges, not tissues or cotton balls. Do not scrub or  use excessive force. Pat dry using gauze sponges, not tissue or cotton balls. Primary Dressing: Prisma 4.34 (in) (DME) (Generic) 1 x Per Day/30 Days Discharge Instructions: Moisten w/normal saline or sterile water; Cover wound as directed. Do not remove from wound bed. Secondary Dressing: Zetuvit Plus Silicone Border Dressing 5x5 (in/in) (DME) (Generic) 1 x Per Day/30 Days WOUND #6: - Gluteus Wound Laterality: Left Cleanser: Byram Ancillary Kit - 15 Day Supply (DME) (Generic) 1 x Per Day/30 Days Discharge Instructions: Use supplies as instructed; Kit contains: (15) Saline Bullets; (15) 3x3 Gauze; 15 pr Gloves Cleanser: Normal Saline (DME) (Generic) 1 x Per Day/30 Days Discharge Instructions: Wash your hands with soap and water. Remove old dressing, discard into plastic bag and place into trash. Cleanse the wound with Normal Saline prior to applying a clean dressing using gauze sponges, not tissues or cotton balls. Do not scrub or use excessive force. Pat dry using gauze sponges, not tissue or cotton balls. Primary Dressing: Prisma 4.34 (in) (DME) (Generic) 1 x Per Day/30 Days Discharge Instructions: Moisten w/normal saline or sterile water; Cover wound as directed. Do not remove from wound bed. Secondary Dressing: Zetuvit Plus Silicone Border Dressing 5x5 (in/in) (DME) (Generic) 1 x Per Day/30 Days 1. Collagen 2. Aggressive offloading 3. Follow-up in 1 week Electronic Signature(s) Signed: 06/14/2021 4:22:13 PM By: Kalman Shan DO Entered By: Kalman Shan on 06/14/2021 16:21:37 Loeper, [name] ([id number]) -------------------------------------------------------------------------------- ROS/PFSH Details Patient Name: James Prude T. Date of Service: 06/14/2021 3:00 PM Medical Record Number: [id number] Patient Account Number: [id number] Date of Birth/Sex: 11-Jun-1931 (87 y.o. M) Treating RN: Carlene Coria Primary Care Provider: Harrel Lemon Other Clinician: Referring Provider:  Referral, Self Treating Provider/Extender: Yaakov Guthrie in Treatment: 0 Information Obtained From Patient Eyes Medical History: Negative for: Cataracts; Glaucoma; Optic Neuritis Ear/Nose/Mouth/Throat Medical History: Negative for: Chronic sinus problems/congestion; Middle ear problems Hematologic/Lymphatic Medical History: Negative for: Anemia; Hemophilia; Human Immunodeficiency Virus; Lymphedema; Sickle Cell Disease Respiratory Medical History: Negative for: Aspiration; Asthma; Chronic Obstructive Pulmonary Disease (COPD); Pneumothorax; Sleep Apnea; Tuberculosis Cardiovascular Medical History: Positive for: Coronary Artery Disease; Hypertension Negative for: Angina; Arrhythmia; Congestive Heart Failure; Deep Vein Thrombosis; Hypotension; Myocardial Infarction; Peripheral Arterial Disease; Peripheral Venous Disease; Phlebitis; Vasculitis Gastrointestinal Medical History: Negative for: Cirrhosis ; Colitis; Crohnos; Hepatitis A; Hepatitis B; Hepatitis C Endocrine Medical History: Negative for: Type I Diabetes; Type II Diabetes Genitourinary Medical History: Past Medical History Notes: stage 3 chronic kidney disease Immunological Medical History: Negative for: Lupus Erythematosus; Raynaudos; Scleroderma Integumentary (Skin) Medical History: Positive for: History of pressure wounds Negative for: History of Burn Musculoskeletal RICKARD, KENNERLY ([id number]) Medical History: Negative for: Gout; Rheumatoid Arthritis; Osteoarthritis; Osteomyelitis Neurologic Medical History: Negative for: Dementia; Neuropathy; Quadriplegia; Paraplegia; Seizure Disorder Psychiatric Medical History: Negative for: Anorexia/bulimia; Confinement Anxiety Immunizations Pneumococcal Vaccine: Received Pneumococcal Vaccination: Yes Received Pneumococcal Vaccination On or After 60th Birthday: No Implantable Devices None Family and Social History Cancer: No; Diabetes: Yes - Mother; Heart  Disease: No; Hereditary Spherocytosis: No; Hypertension: No; Kidney Disease: No; Lung Disease: No; Seizures: No; Stroke: No; Thyroid Problems: No; Tuberculosis: No; Former smoker - quit 57 years ago; Alcohol Use: Rarely; Drug Use: No History; Caffeine Use: Daily Electronic Signature(s) Signed: 06/14/2021 4:22:13 PM By: Kalman Shan DO Signed: 06/15/2021 5:08:20 PM By: Carlene Coria RN Entered By: Carlene Coria on 06/14/2021 15:37:44 Browe, [name] ([id number]) -------------------------------------------------------------------------------- SuperBill Details Patient Name: James Prude T. Date of  Service: 06/14/2021 Medical Record Number: [id number] Patient Account Number: [id number] Date of Birth/Sex: 11-Jan-1931 (87 y.o. M) Treating RN: Carlene Coria Primary Care Provider: Harrel Lemon Other Clinician: Referring Provider: Referral, Self Treating Provider/Extender: Yaakov Guthrie in Treatment: 0 Diagnosis Coding ICD-10 Codes Code Description L89.313 Pressure ulcer of right buttock, stage 3 L89.323 Pressure ulcer of left buttock, stage 3 Facility Procedures CPT4 Code: [id number] Description: 86381 - WOUND CARE VISIT-LEV 4 EST PT Modifier: Quantity: 1 Physician Procedures CPT4 Code: 7711657 Description: 90383 - WC PHYS LEVEL 3 - EST PT Modifier: Quantity: 1 CPT4 Code: Description: ICD-10 Diagnosis Description L89.313 Pressure ulcer of right buttock, stage 3 L89.323 Pressure ulcer of left buttock, stage 3 Modifier: Quantity: Electronic Signature(s) Signed: 06/14/2021 4:22:13 PM By: Kalman Shan DO Previous Signature: 06/14/2021 4:17:38 PM Version By: Carlene Coria RN Previous Signature: 06/14/2021 4:16:13 PM Version By: Carlene Coria RN Entered By: Kalman Shan on 06/14/2021 16:21:55

## 2021-06-19 DIAGNOSIS — L89310 Pressure ulcer of right buttock, unstageable: Secondary | ICD-10-CM | POA: Diagnosis not present

## 2021-06-20 DIAGNOSIS — R29898 Other symptoms and signs involving the musculoskeletal system: Secondary | ICD-10-CM | POA: Diagnosis not present

## 2021-06-20 DIAGNOSIS — J189 Pneumonia, unspecified organism: Secondary | ICD-10-CM | POA: Diagnosis not present

## 2021-06-20 DIAGNOSIS — I5023 Acute on chronic systolic (congestive) heart failure: Secondary | ICD-10-CM | POA: Diagnosis not present

## 2021-06-20 DIAGNOSIS — Z09 Encounter for follow-up examination after completed treatment for conditions other than malignant neoplasm: Secondary | ICD-10-CM | POA: Diagnosis not present

## 2021-06-20 DIAGNOSIS — J90 Pleural effusion, not elsewhere classified: Secondary | ICD-10-CM | POA: Diagnosis not present

## 2021-06-20 DIAGNOSIS — I517 Cardiomegaly: Secondary | ICD-10-CM | POA: Diagnosis not present

## 2021-06-21 ENCOUNTER — Other Ambulatory Visit: Payer: Self-pay

## 2021-06-21 ENCOUNTER — Encounter: Payer: PPO | Attending: Internal Medicine | Admitting: Internal Medicine

## 2021-06-21 DIAGNOSIS — N184 Chronic kidney disease, stage 4 (severe): Secondary | ICD-10-CM | POA: Insufficient documentation

## 2021-06-21 DIAGNOSIS — Z87891 Personal history of nicotine dependence: Secondary | ICD-10-CM | POA: Diagnosis not present

## 2021-06-21 DIAGNOSIS — L89323 Pressure ulcer of left buttock, stage 3: Secondary | ICD-10-CM | POA: Diagnosis not present

## 2021-06-21 DIAGNOSIS — L89313 Pressure ulcer of right buttock, stage 3: Secondary | ICD-10-CM | POA: Insufficient documentation

## 2021-06-21 DIAGNOSIS — I255 Ischemic cardiomyopathy: Secondary | ICD-10-CM | POA: Insufficient documentation

## 2021-06-21 DIAGNOSIS — I2510 Atherosclerotic heart disease of native coronary artery without angina pectoris: Secondary | ICD-10-CM | POA: Diagnosis not present

## 2021-06-21 DIAGNOSIS — I5022 Chronic systolic (congestive) heart failure: Secondary | ICD-10-CM | POA: Diagnosis not present

## 2021-06-21 DIAGNOSIS — I130 Hypertensive heart and chronic kidney disease with heart failure and stage 1 through stage 4 chronic kidney disease, or unspecified chronic kidney disease: Secondary | ICD-10-CM | POA: Insufficient documentation

## 2021-06-22 DIAGNOSIS — J811 Chronic pulmonary edema: Secondary | ICD-10-CM | POA: Diagnosis not present

## 2021-06-22 DIAGNOSIS — J810 Acute pulmonary edema: Secondary | ICD-10-CM | POA: Diagnosis not present

## 2021-06-22 DIAGNOSIS — J90 Pleural effusion, not elsewhere classified: Secondary | ICD-10-CM | POA: Diagnosis not present

## 2021-06-22 DIAGNOSIS — R0609 Other forms of dyspnea: Secondary | ICD-10-CM | POA: Diagnosis not present

## 2021-06-22 DIAGNOSIS — R918 Other nonspecific abnormal finding of lung field: Secondary | ICD-10-CM | POA: Diagnosis not present

## 2021-06-22 NOTE — Progress Notes (Signed)
TERYL, MCCONAGHY ([id number]) Visit Report for 06/21/2021 Chief Complaint Document Details Patient Name: [name], [name]. Date of Service: 06/21/2021 3:30 PM Medical Record Number: [id number] Patient Account Number: [id number] Date of Birth/Sex: 1931-06-11 (87 y.o. M) Treating RN: Dolan Amen Primary Care Provider: Harrel Lemon Other Clinician: Referring Provider: Harrel Lemon Treating Provider/Extender: Yaakov Guthrie in Treatment: 1 Information Obtained from: Patient Chief Complaint 06/14/2021; patient is here for review of wounds on his bilateral buttocks surrounding his coccyx Electronic Signature(s) Signed: 06/22/2021 1:47:04 PM By: Kalman Shan DO Entered By: Kalman Shan on 06/21/2021 15:55:39 [place], [name] ([id number]) -------------------------------------------------------------------------------- HPI Details Patient Name: James Prude T. Date of Service: 06/21/2021 3:30 PM Medical Record Number: [id number] Patient Account Number: [id number] Date of Birth/Sex: 1931/02/02 (87 y.o. M) Treating RN: Dolan Amen Primary Care Provider: Harrel Lemon Other Clinician: Referring Provider: Harrel Lemon Treating Provider/Extender: Yaakov Guthrie in Treatment: 1 History of Present Illness HPI Description: Admission 06/14/2021 [name] is an 87 year old male with a past medical history of CKD stage IV, chronic systolic heart failure, Ischemic cardiomyopathy and recurrent pressure ulcers of his sacrum that presents to the clinic for bilateral buttocks wounds. He was recently hospitalized for acute on chronic systolic congestive heart failure from 9/12 to 06/01/2021. He subsequently developed pressure ulcers to his buttocks. This is not an uncommon history for the patient as he often has recurrence of sacral and buttocks wounds after hospitalizations. He has been using collagen from previous clinic admissions to the wound beds. He reports  improvement in wound healing with this dressing. He currently denies signs of infection. 10/5; patient presents for follow-up. He has been using collagen to the wound beds. He has no issues today. He reports some pain to the left sided wound at times. Overall he feels well. Electronic Signature(s) Signed: 06/22/2021 1:47:04 PM By: Kalman Shan DO Entered By: Kalman Shan on 06/21/2021 15:56:25 Gaiser, [name] ([id number]) -------------------------------------------------------------------------------- Physical Exam Details Patient Name: James Prude T. Date of Service: 06/21/2021 3:30 PM Medical Record Number: [id number] Patient Account Number: [id number] Date of Birth/Sex: [birth year] (87 y.o. M) Treating RN: Dolan Amen Primary Care Provider: Harrel Lemon Other Clinician: Referring Provider: Harrel Lemon Treating Provider/Extender: Yaakov Guthrie in Treatment: 1 Constitutional . Psychiatric . Notes Left-sided buttocks wound with granulation tissue present. Right sided buttocks wound has a scab over it. Overall appears well-healing. Electronic Signature(s) Signed: 06/22/2021 1:47:04 PM By: Kalman Shan DO Entered By: Kalman Shan on 06/21/2021 15:57:16 [place], [name] ([id number]) -------------------------------------------------------------------------------- Physician Orders Details Patient Name: James Prude T. Date of Service: 06/21/2021 3:30 PM Medical Record Number: [id number] Patient Account Number: [id number] Date of Birth/Sex: [birth year] (87 y.o. M) Treating RN: Dolan Amen Primary Care Provider: Harrel Lemon Other Clinician: Referring Provider: Harrel Lemon Treating Provider/Extender: Yaakov Guthrie in Treatment: 1 Verbal / Phone Orders: No Diagnosis Coding Follow-up Appointments o Return Appointment in 2 weeks. Bathing/ Shower/ Hygiene o Clean wound with Normal Saline or wound cleanser. o Wash wounds with  antibacterial soap and water. Off-Loading o Turn and reposition every 2 hours - Keep pressure off of wounded areas Wound Treatment Wound #5 - Gluteus Wound Laterality: Right Cleanser: Normal Saline (Generic) 1 x Per Day/30 Days Discharge Instructions: Wash your hands with soap and water. Remove old dressing, discard into plastic bag and place into trash. Cleanse the wound with Normal Saline prior to applying a clean dressing using gauze sponges, not tissues or cotton balls. Do not scrub or use excessive force. Pat dry using  gauze sponges, not tissue or cotton balls. Primary Dressing: Prisma 4.34 (in) (Generic) 1 x Per Day/30 Days Discharge Instructions: Moisten w/normal saline or sterile water; Cover wound as directed. Do not remove from wound bed. Secondary Dressing: Zetuvit Plus Silicone Border Dressing 5x5 (in/in) (Generic) 1 x Per Day/30 Days Discharge Instructions: Secure dressing Wound #6 - Gluteus Wound Laterality: Left Cleanser: Wound Cleanser 1 x Per Day/30 Days Discharge Instructions: Wash your hands with soap and water. Remove old dressing, discard into plastic bag and place into trash. Cleanse the wound with Wound Cleanser prior to applying a clean dressing using gauze sponges, not tissues or cotton balls. Do not scrub or use excessive force. Pat dry using gauze sponges, not tissue or cotton balls. Topical: Desitin Maximum Strength Ointment, 1 (oz) tube 1 x Per Day/30 Days Discharge Instructions: Apply zinc oxide to reddened area Electronic Signature(s) Signed: 06/22/2021 1:42:44 PM By: Dolan Amen RN Signed: 06/22/2021 1:47:04 PM By: Kalman Shan DO Entered By: Dolan Amen on 06/21/2021 15:55:45 [place], [name] ([id number]) -------------------------------------------------------------------------------- Problem List Details Patient Name: James Prude T. Date of Service: 06/21/2021 3:30 PM Medical Record Number: [id number] Patient Account Number: [id number] Date  of Birth/Sex: 03-16-1931 (87 y.o. M) Treating RN: Dolan Amen Primary Care Provider: Harrel Lemon Other Clinician: Referring Provider: Harrel Lemon Treating Provider/Extender: Yaakov Guthrie in Treatment: 1 Active Problems ICD-10 Encounter Code Description Active Date MDM Diagnosis L89.313 Pressure ulcer of right buttock, stage 3 06/14/2021 No Yes L89.323 Pressure ulcer of left buttock, stage 3 06/14/2021 No Yes Inactive Problems Resolved Problems Electronic Signature(s) Signed: 06/22/2021 1:47:04 PM By: Kalman Shan DO Entered By: Kalman Shan on 06/21/2021 15:55:20 [place]as, [name] ([id number]) -------------------------------------------------------------------------------- Progress Note Details Patient Name: James Prude T. Date of Service: 06/21/2021 3:30 PM Medical Record Number: [id number] Patient Account Number: [id number] Date of Birth/Sex: Mar 05, 1931 (87 y.o. M) Treating RN: Dolan Amen Primary Care Provider: Harrel Lemon Other Clinician: Referring Provider: Harrel Lemon Treating Provider/Extender: Yaakov Guthrie in Treatment: 1 Subjective Chief Complaint Information obtained from Patient 06/14/2021; patient is here for review of wounds on his bilateral buttocks surrounding his coccyx History of Present Illness (HPI) Admission 06/14/2021 Mr. Milburn Freeney is an 87 year old male with a past medical history of CKD stage IV, chronic systolic heart failure, Ischemic cardiomyopathy and recurrent pressure ulcers of his sacrum that presents to the clinic for bilateral buttocks wounds. He was recently hospitalized for acute on chronic systolic congestive heart failure from 9/12 to 06/01/2021. He subsequently developed pressure ulcers to his buttocks. This is not an uncommon history for the patient as he often has recurrence of sacral and buttocks wounds after hospitalizations. He has been using collagen from previous clinic admissions to the  wound beds. He reports improvement in wound healing with this dressing. He currently denies signs of infection. 10/5; patient presents for follow-up. He has been using collagen to the wound beds. He has no issues today. He reports some pain to the left sided wound at times. Overall he feels well. Patient History Information obtained from Patient. Family History Diabetes - Mother, No family history of Cancer, Heart Disease, Hereditary Spherocytosis, Hypertension, Kidney Disease, Lung Disease, Seizures, Stroke, Thyroid Problems, Tuberculosis. Social History Former smoker - quit 57 years ago, Alcohol Use - Rarely, Drug Use - No History, Caffeine Use - Daily. Medical History Eyes Denies history of Cataracts, Glaucoma, Optic Neuritis Ear/Nose/Mouth/Throat Denies history of Chronic sinus problems/congestion, Middle ear problems Hematologic/Lymphatic Denies history of Anemia, Hemophilia, Human Immunodeficiency Virus, Lymphedema, Sickle Cell  Disease Respiratory Denies history of Aspiration, Asthma, Chronic Obstructive Pulmonary Disease (COPD), Pneumothorax, Sleep Apnea, Tuberculosis Cardiovascular Patient has history of Coronary Artery Disease, Hypertension Denies history of Angina, Arrhythmia, Congestive Heart Failure, Deep Vein Thrombosis, Hypotension, Myocardial Infarction, Peripheral Arterial Disease, Peripheral Venous Disease, Phlebitis, Vasculitis Gastrointestinal Denies history of Cirrhosis , Colitis, Crohn s, Hepatitis A, Hepatitis B, Hepatitis C Endocrine Denies history of Type I Diabetes, Type II Diabetes Immunological Denies history of Lupus Erythematosus, Raynaud s, Scleroderma Integumentary (Skin) Patient has history of History of pressure wounds Denies history of History of Burn Musculoskeletal Denies history of Gout, Rheumatoid Arthritis, Osteoarthritis, Osteomyelitis Neurologic Denies history of Dementia, Neuropathy, Quadriplegia, Paraplegia, Seizure  Disorder Psychiatric Denies history of Anorexia/bulimia, Confinement Anxiety Medical And Surgical History Notes Genitourinary stage 3 chronic kidney disease Shelvin, Courtez T. ([id number]) Objective Constitutional Vitals Time Taken: 3:30 PM, Height: 71 in, Weight: 154 lbs, BMI: 21.5, Temperature: 97.9 F, Pulse: 66 bpm, Respiratory Rate: 18 breaths/min, Blood Pressure: 116/68 mmHg. General Notes: Left-sided buttocks wound with granulation tissue present. Right sided buttocks wound has a scab over it. Overall appears well- healing. Integumentary (Hair, Skin) Wound #5 status is Open. Original cause of wound was Gradually Appeared. The date acquired was: 05/18/2021. The wound has been in treatment 1 weeks. The wound is located on the Right Gluteus. The wound measures 0.1cm length x 0.1cm width x 0.1cm depth; 0.008cm^2 area and 0.001cm^3 volume. There is Fat Layer (Subcutaneous Tissue) exposed. There is no tunneling or undermining noted. There is a none present amount of drainage noted. There is no granulation within the wound bed. There is no necrotic tissue within the wound bed. Wound #6 status is Open. Original cause of wound was Gradually Appeared. The date acquired was: 05/18/2021. The wound has been in treatment 1 weeks. The wound is located on the Left Gluteus. The wound measures 0.6cm length x 0.2cm width x 0.1cm depth; 0.094cm^2 area and 0.009cm^3 volume. There is Fat Layer (Subcutaneous Tissue) exposed. There is no tunneling or undermining noted. There is a medium amount of serosanguineous drainage noted. There is large (67-100%) red granulation within the wound bed. There is a small (1-33%) amount of necrotic tissue within the wound bed including Adherent Slough. Assessment Active Problems ICD-10 Pressure ulcer of right buttock, stage 3 Pressure ulcer of left buttock, stage 3 Patient's wounds have shown improvement in size and appearance since last clinic visit. The right buttocks wound  has a scab and I recommended zinc oxide to this area. To the left sided buttocks wound I recommended continuing collagen. We discussed aggressive offloading. No signs of infection on exam. Follow-up in 2 weeks Plan Follow-up Appointments: Return Appointment in 2 weeks. Bathing/ Shower/ Hygiene: Clean wound with Normal Saline or wound cleanser. Wash wounds with antibacterial soap and water. Off-Loading: Turn and reposition every 2 hours - Keep pressure off of wounded areas WOUND #5: - Gluteus Wound Laterality: Right Cleanser: Normal Saline (Generic) 1 x Per Day/30 Days Discharge Instructions: Wash your hands with soap and water. Remove old dressing, discard into plastic bag and place into trash. Cleanse the wound with Normal Saline prior to applying a clean dressing using gauze sponges, not tissues or cotton balls. Do not scrub or use excessive force. Pat dry using gauze sponges, not tissue or cotton balls. Primary Dressing: Prisma 4.34 (in) (Generic) 1 x Per Day/30 Days Discharge Instructions: Moisten w/normal saline or sterile water; Cover wound as directed. Do not remove from wound bed. Secondary Dressing: Zetuvit Plus Silicone Border Dressing  5x5 (in/in) (Generic) 1 x Per Day/30 Days Discharge Instructions: Secure dressing WOUND #6: - Gluteus Wound Laterality: Left Cleanser: Wound Cleanser 1 x Per Day/30 Days Discharge Instructions: Wash your hands with soap and water. Remove old dressing, discard into plastic bag and place into trash. Cleanse the wound with Wound Cleanser prior to applying a clean dressing using gauze sponges, not tissues or cotton balls. Do not scrub or use excessive force. Pat dry using gauze sponges, not tissue or cotton balls. Topical: Desitin Maximum Strength Ointment, 1 (oz) tube 1 x Per Day/30 Days Discharge Instructions: Apply zinc oxide to reddened area Lesinski, Tameron T. ([id number]) 1. Silver collagen 2. Zinc oxide 3. Aggressive offloading 4. Follow-up  in 2 weeks Electronic Signature(s) Signed: 06/22/2021 1:47:04 PM By: Kalman Shan DO Entered By: Kalman Shan on 06/21/2021 15:58:27 Fitz, [name] ([id number]) -------------------------------------------------------------------------------- ROS/PFSH Details Patient Name: James Prude T. Date of Service: 06/21/2021 3:30 PM Medical Record Number: [id number] Patient Account Number: [id number] Date of Birth/Sex: 1931/09/04 (87 y.o. M) Treating RN: Dolan Amen Primary Care Provider: Harrel Lemon Other Clinician: Referring Provider: Harrel Lemon Treating Provider/Extender: Yaakov Guthrie in Treatment: 1 Information Obtained From Patient Eyes Medical History: Negative for: Cataracts; Glaucoma; Optic Neuritis Ear/Nose/Mouth/Throat Medical History: Negative for: Chronic sinus problems/congestion; Middle ear problems Hematologic/Lymphatic Medical History: Negative for: Anemia; Hemophilia; Human Immunodeficiency Virus; Lymphedema; Sickle Cell Disease Respiratory Medical History: Negative for: Aspiration; Asthma; Chronic Obstructive Pulmonary Disease (COPD); Pneumothorax; Sleep Apnea; Tuberculosis Cardiovascular Medical History: Positive for: Coronary Artery Disease; Hypertension Negative for: Angina; Arrhythmia; Congestive Heart Failure; Deep Vein Thrombosis; Hypotension; Myocardial Infarction; Peripheral Arterial Disease; Peripheral Venous Disease; Phlebitis; Vasculitis Gastrointestinal Medical History: Negative for: Cirrhosis ; Colitis; Crohnos; Hepatitis A; Hepatitis B; Hepatitis C Endocrine Medical History: Negative for: Type I Diabetes; Type II Diabetes Genitourinary Medical History: Past Medical History Notes: stage 3 chronic kidney disease Immunological Medical History: Negative for: Lupus Erythematosus; Raynaudos; Scleroderma Integumentary (Skin) Medical History: Positive for: History of pressure wounds Negative for: History of  Burn Musculoskeletal ARTAVIOUS, TREBILCOCK ([id number]) Medical History: Negative for: Gout; Rheumatoid Arthritis; Osteoarthritis; Osteomyelitis Neurologic Medical History: Negative for: Dementia; Neuropathy; Quadriplegia; Paraplegia; Seizure Disorder Psychiatric Medical History: Negative for: Anorexia/bulimia; Confinement Anxiety Immunizations Pneumococcal Vaccine: Received Pneumococcal Vaccination: Yes Received Pneumococcal Vaccination On or After 60th Birthday: No Implantable Devices None Family and Social History Cancer: No; Diabetes: Yes - Mother; Heart Disease: No; Hereditary Spherocytosis: No; Hypertension: No; Kidney Disease: No; Lung Disease: No; Seizures: No; Stroke: No; Thyroid Problems: No; Tuberculosis: No; Former smoker - quit 57 years ago; Alcohol Use: Rarely; Drug Use: No History; Caffeine Use: Daily Electronic Signature(s) Signed: 06/22/2021 1:42:44 PM By: Dolan Amen RN Signed: 06/22/2021 1:47:04 PM By: Kalman Shan DO Entered By: Kalman Shan on 06/21/2021 15:56:30 Koppenhaver, [name] ([id number]) -------------------------------------------------------------------------------- SuperBill Details Patient Name: James Prude T. Date of Service: 06/21/2021 Medical Record Number: [id number] Patient Account Number: [id number] Date of Birth/Sex: June 16, 1931 (87 y.o. M) Treating RN: Dolan Amen Primary Care Provider: Harrel Lemon Other Clinician: Referring Provider: Harrel Lemon Treating Provider/Extender: Yaakov Guthrie in Treatment: 1 Diagnosis Coding ICD-10 Codes Code Description L89.313 Pressure ulcer of right buttock, stage 3 L89.323 Pressure ulcer of left buttock, stage 3 Facility Procedures CPT4 Code: [id number] Description: [phone] - WOUND CARE VISIT-LEV 2 EST PT Modifier: Quantity: 1 Physician Procedures CPT4 Code: [id number] Description: [id number] - WC PHYS LEVEL 3 - EST PT Modifier: Quantity: 1 CPT4 Code: Description: ICD-10 Diagnosis  Description L89.313 Pressure ulcer of right buttock, stage 3 L89.323 Pressure ulcer  of left buttock, stage 3 Modifier: Quantity: Electronic Signature(s) Signed: 06/22/2021 1:47:04 PM By: Kalman Shan DO Entered By: Kalman Shan on 06/21/2021 15:58:43

## 2021-06-22 NOTE — Progress Notes (Addendum)
James, Holt ([id number]) Visit Report for 06/21/2021 Arrival Information Details Patient Name: [name], [name]. Date of Service: 06/21/2021 3:30 PM Medical Record Number: [id number] Patient Account Number: [id number] Date of Birth/Sex: [birth year] (87 y.o. M) Treating RN: Dolan Amen Primary Care Deamonte Sayegh: Harrel Lemon Other Clinician: Referring Avleen Bordwell: Harrel Lemon Treating Jaleya Pebley/Extender: Yaakov Guthrie in Treatment: 1 Visit Information History Since Last Visit Pain Present Now: Yes Patient Arrived: Wheel Chair Arrival Time: 15:30 Accompanied By: daughter in law Transfer Assistance: Manual Patient Identification Verified: Yes Secondary Verification Process Completed: Yes Patient Requires Transmission-Based Precautions: No Patient Has Alerts: No Electronic Signature(s) Signed: 06/22/2021 1:42:44 PM By: Dolan Amen RN Entered By: Dolan Amen on 06/21/2021 15:30:21 Popov, [name] ([id number]) -------------------------------------------------------------------------------- Clinic Level of Care Assessment Details Patient Name: James Prude T. Date of Service: 06/21/2021 3:30 PM Medical Record Number: [id number] Patient Account Number: [id number] Date of Birth/Sex: Feb 14, 1931 (87 y.o. M) Treating RN: Dolan Amen Primary Care Valeriano Bain: Harrel Lemon Other Clinician: Referring Peachie Barkalow: Harrel Lemon Treating Dionysios Massman/Extender: Yaakov Guthrie in Treatment: 1 Clinic Level of Care Assessment Items TOOL 4 Quantity Score X - Use when only an EandM is performed on FOLLOW-UP visit 1 0 ASSESSMENTS - Nursing Assessment / Reassessment X - Reassessment of Co-morbidities (includes updates in patient status) 1 10 X- 1 5 Reassessment of Adherence to Treatment Plan ASSESSMENTS - Wound and Skin Assessment / Reassessment []  - Simple Wound Assessment / Reassessment - one wound 0 X- 1 5 Complex Wound Assessment / Reassessment - multiple wounds []  -  0 Dermatologic / Skin Assessment (not related to wound area) ASSESSMENTS - Focused Assessment []  - Circumferential Edema Measurements - multi extremities 0 []  - 0 Nutritional Assessment / Counseling / Intervention []  - 0 Lower Extremity Assessment (monofilament, tuning fork, pulses) []  - 0 Peripheral Arterial Disease Assessment (using hand held doppler) ASSESSMENTS - Ostomy and/or Continence Assessment and Care []  - Incontinence Assessment and Management 0 []  - 0 Ostomy Care Assessment and Management (repouching, etc.) PROCESS - Coordination of Care X - Simple Patient / Family Education for ongoing care 1 15 []  - 0 Complex (extensive) Patient / Family Education for ongoing care []  - 0 Staff obtains Programmer, systems, Records, Test Results / Process Orders []  - 0 Staff telephones HHA, Nursing Homes / Clarify orders / etc []  - 0 Routine Transfer to another Facility (non-emergent condition) []  - 0 Routine Hospital Admission (non-emergent condition) []  - 0 New Admissions / Biomedical engineer / Ordering NPWT, Apligraf, etc. []  - 0 Emergency Hospital Admission (emergent condition) X- 1 10 Simple Discharge Coordination []  - 0 Complex (extensive) Discharge Coordination PROCESS - Special Needs []  - Pediatric / Minor Patient Management 0 []  - 0 Isolation Patient Management []  - 0 Hearing / Language / Visual special needs []  - 0 Assessment of Community assistance (transportation, D/C planning, etc.) []  - 0 Additional assistance / Altered mentation []  - 0 Support Surface(s) Assessment (bed, cushion, seat, etc.) INTERVENTIONS - Wound Cleansing / Measurement Holt, James T. ([id number]) X- 1 5 Simple Wound Cleansing - one wound []  - 0 Complex Wound Cleansing - multiple wounds []  - 0 Wound Imaging (photographs - any number of wounds) []  - 0 Wound Tracing (instead of photographs) X- 1 5 Simple Wound Measurement - one wound []  - 0 Complex Wound Measurement - multiple  wounds INTERVENTIONS - Wound Dressings []  - Small Wound Dressing one or multiple wounds 0 X- 1 15 Medium Wound Dressing one or multiple wounds []  - 0 Large Wound  Dressing one or multiple wounds []  - 0 Application of Medications - topical []  - 0 Application of Medications - injection INTERVENTIONS - Miscellaneous []  - External ear exam 0 []  - 0 Specimen Collection (cultures, biopsies, blood, body fluids, etc.) []  - 0 Specimen(s) / Culture(s) sent or taken to Lab for analysis []  - 0 Patient Transfer (multiple staff / Civil Service fast streamer / Similar devices) []  - 0 Simple Staple / Suture removal (25 or less) []  - 0 Complex Staple / Suture removal (26 or more) []  - 0 Hypo / Hyperglycemic Management (close monitor of Blood Glucose) []  - 0 Ankle / Brachial Index (ABI) - do not check if billed separately X- 1 5 Vital Signs Has the patient been seen at the hospital within the last three years: Yes Total Score: 75 Level Of Care: New/Established - Level 2 Electronic Signature(s) Signed: 06/22/2021 1:42:44 PM By: Dolan Amen RN Entered By: Dolan Amen on 06/21/2021 15:56:40 Zmuda, [name] ([id number]) -------------------------------------------------------------------------------- Encounter Discharge Information Details Patient Name: James Prude T. Date of Service: 06/21/2021 3:30 PM Medical Record Number: [id number] Patient Account Number: [id number] Date of Birth/Sex: 06-Aug-1931 (87 y.o. M) Treating RN: Dolan Amen Primary Care Epifanio Labrador: Harrel Lemon Other Clinician: Referring Laia Wiley: Harrel Lemon Treating Arrington Yohe/Extender: Yaakov Guthrie in Treatment: 1 Encounter Discharge Information Items Discharge Condition: Stable Ambulatory Status: Wheelchair Discharge Destination: Home Transportation: Private Auto Accompanied By: daughter in law Schedule Follow-up Appointment: Yes Clinical Summary of Care: Electronic Signature(s) Signed: 06/21/2021 4:07:16 PM By:  Dolan Amen RN Entered By: Dolan Amen on 06/21/2021 16:07:16 Ballen, [name] ([id number]) -------------------------------------------------------------------------------- Lower Extremity Assessment Details Patient Name: James Prude T. Date of Service: 06/21/2021 3:30 PM Medical Record Number: [id number] Patient Account Number: [id number] Date of Birth/Sex: 25-Jan-1931 (87 y.o. M) Treating RN: Dolan Amen Primary Care Llewellyn Choplin: Harrel Lemon Other Clinician: Referring Shenika Quint: Harrel Lemon Treating Glenna Brunkow/Extender: Yaakov Guthrie in Treatment: 1 Electronic Signature(s) Signed: 06/22/2021 1:42:44 PM By: Dolan Amen RN Entered By: Dolan Amen on 06/21/2021 15:40:47 Ryle, [name] ([id number]) -------------------------------------------------------------------------------- Multi Wound Chart Details Patient Name: James Prude T. Date of Service: 06/21/2021 3:30 PM Medical Record Number: [id number] Patient Account Number: [id number] Date of Birth/Sex: 1931-08-06 (87 y.o. M) Treating RN: Dolan Amen Primary Care Lisabeth Mian: Harrel Lemon Other Clinician: Referring Glendene Wyer: Harrel Lemon Treating Phallon Haydu/Extender: Yaakov Guthrie in Treatment: 1 Vital Signs Height(in): 71 Pulse(bpm): 37 Weight(lbs): 154 Blood Pressure(mmHg): 116/68 Body Mass Index(BMI): 21 Temperature(F): 97.9 Respiratory Rate(breaths/min): 18 Photos: [N/A:N/A] Wound Location: Right Gluteus Left Gluteus N/A Wounding Event: Gradually Appeared Gradually Appeared N/A Primary Etiology: Pressure Ulcer Pressure Ulcer N/A Comorbid History: Coronary Artery Disease, Coronary Artery Disease, N/A Hypertension, History of pressure Hypertension, History of pressure wounds wounds Date Acquired: 05/18/2021 05/18/2021 N/A Weeks of Treatment: 1 1 N/A Wound Status: Open Open N/A Measurements L x W x D (cm) 0.1x0.1x0.1 0.6x0.2x0.1 N/A Area (cm) : 0.008 0.094 N/A Volume (cm) : 0.001  0.009 N/A % Reduction in Area: 91.50% 70.10% N/A % Reduction in Volume: 88.90% 71.00% N/A Classification: Category/Stage III Category/Stage III N/A Exudate Amount: None Present Medium N/A Exudate Type: N/A Serosanguineous N/A Exudate Color: N/A red, brown N/A Granulation Amount: None Present (0%) Large (67-100%) N/A Granulation Quality: N/A Red N/A Necrotic Amount: None Present (0%) Small (1-33%) N/A Exposed Structures: Fat Layer (Subcutaneous Tissue): Fat Layer (Subcutaneous Tissue): N/A Yes Yes Fascia: No Fascia: No Tendon: No Tendon: No Muscle: No Muscle: No Joint: No Joint: No Bone: No Bone: No Epithelialization: Large (67-100%) None N/A Treatment Notes Electronic  Signature(s) Signed: 06/22/2021 1:47:04 PM By: Kalman Shan DO Entered By: Kalman Shan on 06/21/2021 15:55:27 Simmering, [name] ([id number]) -------------------------------------------------------------------------------- [place] Details Patient Name: James Prude T. Date of Service: 06/21/2021 3:30 PM Medical Record Number: [id number] Patient Account Number: [id number] Date of Birth/Sex: 01/02/1931 (87 y.o. M) Treating RN: Dolan Amen Primary Care [place] Millon: Harrel Lemon Other Clinician: Referring Doreena Maulden: Harrel Lemon Treating Freddi Forster/Extender: Yaakov Guthrie in Treatment: 1 Active Inactive Electronic Signature(s) Signed: 07/05/2021 12:08:11 PM By: Gretta Cool, BSN, RN, CWS, Kim RN, BSN Signed: 07/07/2021 5:06:41 PM By: Dolan Amen RN Previous Signature: 06/22/2021 1:42:44 PM Version By: Dolan Amen RN Entered By: Gretta Cool BSN, RN, CWS, Kim on 07/05/2021 12:08:11 [place], [name] ([id number]) -------------------------------------------------------------------------------- Pain Assessment Details Patient Name: James Prude T. Date of Service: 06/21/2021 3:30 PM Medical Record Number: [id number] Patient Account Number: [id number] Date of Birth/Sex: November 13, 1930  (87 y.o. M) Treating RN: Dolan Amen Primary Care Orvin Netter: Harrel Lemon Other Clinician: Referring Shams Fill: Harrel Lemon Treating Judieth Mckown/Extender: Yaakov Guthrie in Treatment: 1 Active Problems Location of Pain Severity and Description of Pain Patient Has Paino Yes Site Locations Pain Location: Pain in Ulcers Rate the pain. Current Pain Level: 4 Pain Management and Medication Current Pain Management: Electronic Signature(s) Signed: 06/22/2021 1:42:44 PM By: Dolan Amen RN Entered By: Dolan Amen on 06/21/2021 15:34:29 Rabine, [name] ([id number]) -------------------------------------------------------------------------------- Patient/Caregiver Education Details Patient Name: James Prude T. Date of Service: 06/21/2021 3:30 PM Medical Record Number: [id number] Patient Account Number: [id number] Date of Birth/Gender: [birth year] (87 y.o. M) Treating RN: Dolan Amen Primary Care Physician: Harrel Lemon Other Clinician: Referring Physician: Harrel Lemon Treating Physician/Extender: Yaakov Guthrie in Treatment: 1 Education Assessment Education Provided To: Patient Education Topics Provided Pressure: Methods: Explain/Verbal Responses: State content correctly Electronic Signature(s) Signed: 06/22/2021 1:42:44 PM By: Dolan Amen RN Entered By: Dolan Amen on 06/21/2021 16:06:46 Postiglione, [name] ([id number]) -------------------------------------------------------------------------------- Wound Assessment Details Patient Name: James Prude T. Date of Service: 06/21/2021 3:30 PM Medical Record Number: [id number] Patient Account Number: [id number] Date of Birth/Sex: 1931/01/05 (87 y.o. M) Treating RN: Dolan Amen Primary Care Claritza July: Harrel Lemon Other Clinician: Referring Jossette Zirbel: Harrel Lemon Treating Dazaria Macneill/Extender: Yaakov Guthrie in Treatment: 1 Wound Status Wound Number: 5 Primary Pressure  Ulcer Etiology: Wound Location: Right Gluteus Wound Status: Open Wounding Event: Gradually Appeared Comorbid Coronary Artery Disease, Hypertension, History of Date Acquired: 05/18/2021 History: pressure wounds Weeks Of Treatment: 1 Clustered Wound: No Photos Wound Measurements Length: (cm) 0.1 Width: (cm) 0.1 Depth: (cm) 0.1 Area: (cm) 0.008 Volume: (cm) 0.001 % Reduction in Area: 91.5% % Reduction in Volume: 88.9% Epithelialization: Large (67-100%) Tunneling: No Undermining: No Wound Description Classification: Category/Stage III Exudate Amount: None Present Foul Odor After Cleansing: No Slough/Fibrino No Wound Bed Granulation Amount: None Present (0%) Exposed Structure Necrotic Amount: None Present (0%) Fascia Exposed: No Fat Layer (Subcutaneous Tissue) Exposed: Yes Tendon Exposed: No Muscle Exposed: No Joint Exposed: No Bone Exposed: No Electronic Signature(s) Signed: 06/22/2021 1:42:44 PM By: Dolan Amen RN Entered By: Dolan Amen on 06/21/2021 15:39:45 Avedisian, [name] ([id number]) -------------------------------------------------------------------------------- Wound Assessment Details Patient Name: James Prude T. Date of Service: 06/21/2021 3:30 PM Medical Record Number: [id number] Patient Account Number: [id number] Date of Birth/Sex: [birth year] (87 y.o. M) Treating RN: Dolan Amen Primary Care Florentino Laabs: Harrel Lemon Other Clinician: Referring Maddux First: Harrel Lemon Treating Vedansh Kerstetter/Extender: Yaakov Guthrie in Treatment: 1 Wound Status Wound Number: 6 Primary Pressure Ulcer Etiology: Wound Location: Left Gluteus Wound Status: Open Wounding Event: Gradually Appeared Comorbid Coronary Artery Disease,  Hypertension, History of Date Acquired: 05/18/2021 History: pressure wounds Weeks Of Treatment: 1 Clustered Wound: No Photos Wound Measurements Length: (cm) 0.6 Width: (cm) 0.2 Depth: (cm) 0.1 Area: (cm) 0.094 Volume: (cm)  0.009 % Reduction in Area: 70.1% % Reduction in Volume: 71% Epithelialization: None Tunneling: No Undermining: No Wound Description Classification: Category/Stage III Exudate Amount: Medium Exudate Type: Serosanguineous Exudate Color: red, brown Foul Odor After Cleansing: No Slough/Fibrino Yes Wound Bed Granulation Amount: Large (67-100%) Exposed Structure Granulation Quality: Red Fascia Exposed: No Necrotic Amount: Small (1-33%) Fat Layer (Subcutaneous Tissue) Exposed: Yes Necrotic Quality: Adherent Slough Tendon Exposed: No Muscle Exposed: No Joint Exposed: No Bone Exposed: No Electronic Signature(s) Signed: 06/22/2021 1:42:44 PM By: Dolan Amen RN Entered By: Dolan Amen on 06/21/2021 15:40:15 [place], [name] ([id number]) -------------------------------------------------------------------------------- Vitals Details Patient Name: James Prude T. Date of Service: 06/21/2021 3:30 PM Medical Record Number: [id number] Patient Account Number: [id number] Date of Birth/Sex: [birth year] (87 y.o. M) Treating RN: Dolan Amen Primary Care Shalae Belmonte: Harrel Lemon Other Clinician: Referring Tayen Narang: Harrel Lemon Treating Meyli Boice/Extender: Yaakov Guthrie in Treatment: 1 Vital Signs Time Taken: 15:30 Temperature (F): 97.9 Height (in): 71 Pulse (bpm): 66 Weight (lbs): 154 Respiratory Rate (breaths/min): 18 Body Mass Index (BMI): 21.5 Blood Pressure (mmHg): 116/68 Reference Range: 80 - 120 mg / dl Electronic Signature(s) Signed: 06/22/2021 1:42:44 PM By: Dolan Amen RN Entered By: Dolan Amen on 06/21/2021 15:33:57

## 2021-06-26 DIAGNOSIS — Z9981 Dependence on supplemental oxygen: Secondary | ICD-10-CM | POA: Diagnosis not present

## 2021-06-26 DIAGNOSIS — I5023 Acute on chronic systolic (congestive) heart failure: Secondary | ICD-10-CM | POA: Diagnosis not present

## 2021-06-26 DIAGNOSIS — J189 Pneumonia, unspecified organism: Secondary | ICD-10-CM | POA: Diagnosis not present

## 2021-06-26 DIAGNOSIS — Z7982 Long term (current) use of aspirin: Secondary | ICD-10-CM | POA: Diagnosis not present

## 2021-06-26 DIAGNOSIS — E782 Mixed hyperlipidemia: Secondary | ICD-10-CM | POA: Diagnosis not present

## 2021-06-26 DIAGNOSIS — I739 Peripheral vascular disease, unspecified: Secondary | ICD-10-CM | POA: Diagnosis not present

## 2021-06-26 DIAGNOSIS — I10 Essential (primary) hypertension: Secondary | ICD-10-CM | POA: Diagnosis not present

## 2021-06-26 DIAGNOSIS — R29898 Other symptoms and signs involving the musculoskeletal system: Secondary | ICD-10-CM | POA: Diagnosis not present

## 2021-06-26 DIAGNOSIS — I2510 Atherosclerotic heart disease of native coronary artery without angina pectoris: Secondary | ICD-10-CM | POA: Diagnosis not present

## 2021-06-28 ENCOUNTER — Ambulatory Visit: Payer: PPO | Admitting: Internal Medicine

## 2021-06-29 DIAGNOSIS — N184 Chronic kidney disease, stage 4 (severe): Secondary | ICD-10-CM | POA: Diagnosis not present

## 2021-06-29 DIAGNOSIS — J811 Chronic pulmonary edema: Secondary | ICD-10-CM | POA: Diagnosis not present

## 2021-06-29 DIAGNOSIS — I517 Cardiomegaly: Secondary | ICD-10-CM | POA: Diagnosis not present

## 2021-07-05 ENCOUNTER — Ambulatory Visit: Payer: PPO | Admitting: Internal Medicine

## 2021-07-10 DIAGNOSIS — Z872 Personal history of diseases of the skin and subcutaneous tissue: Secondary | ICD-10-CM | POA: Diagnosis not present

## 2021-07-10 DIAGNOSIS — Z09 Encounter for follow-up examination after completed treatment for conditions other than malignant neoplasm: Secondary | ICD-10-CM | POA: Diagnosis not present

## 2021-07-12 DIAGNOSIS — I5023 Acute on chronic systolic (congestive) heart failure: Secondary | ICD-10-CM | POA: Diagnosis not present

## 2021-07-12 DIAGNOSIS — I509 Heart failure, unspecified: Secondary | ICD-10-CM | POA: Diagnosis not present

## 2021-07-19 ENCOUNTER — Ambulatory Visit: Payer: PPO | Admitting: Family

## 2021-07-19 DIAGNOSIS — E782 Mixed hyperlipidemia: Secondary | ICD-10-CM | POA: Diagnosis not present

## 2021-07-19 DIAGNOSIS — J189 Pneumonia, unspecified organism: Secondary | ICD-10-CM | POA: Diagnosis not present

## 2021-07-19 DIAGNOSIS — Z9981 Dependence on supplemental oxygen: Secondary | ICD-10-CM | POA: Diagnosis not present

## 2021-07-19 DIAGNOSIS — I5023 Acute on chronic systolic (congestive) heart failure: Secondary | ICD-10-CM | POA: Diagnosis not present

## 2021-07-19 DIAGNOSIS — I10 Essential (primary) hypertension: Secondary | ICD-10-CM | POA: Diagnosis not present

## 2021-07-19 DIAGNOSIS — Z7982 Long term (current) use of aspirin: Secondary | ICD-10-CM | POA: Diagnosis not present

## 2021-07-19 DIAGNOSIS — I2510 Atherosclerotic heart disease of native coronary artery without angina pectoris: Secondary | ICD-10-CM | POA: Diagnosis not present

## 2021-07-19 DIAGNOSIS — I739 Peripheral vascular disease, unspecified: Secondary | ICD-10-CM | POA: Diagnosis not present

## 2021-07-19 DIAGNOSIS — R29898 Other symptoms and signs involving the musculoskeletal system: Secondary | ICD-10-CM | POA: Diagnosis not present

## 2021-07-20 DIAGNOSIS — I129 Hypertensive chronic kidney disease with stage 1 through stage 4 chronic kidney disease, or unspecified chronic kidney disease: Secondary | ICD-10-CM | POA: Diagnosis not present

## 2021-07-20 DIAGNOSIS — D631 Anemia in chronic kidney disease: Secondary | ICD-10-CM | POA: Diagnosis not present

## 2021-07-20 DIAGNOSIS — N2581 Secondary hyperparathyroidism of renal origin: Secondary | ICD-10-CM | POA: Diagnosis not present

## 2021-07-20 DIAGNOSIS — N184 Chronic kidney disease, stage 4 (severe): Secondary | ICD-10-CM | POA: Diagnosis not present

## 2021-07-20 DIAGNOSIS — I5022 Chronic systolic (congestive) heart failure: Secondary | ICD-10-CM | POA: Diagnosis not present

## 2021-07-26 ENCOUNTER — Other Ambulatory Visit
Admission: RE | Admit: 2021-07-26 | Discharge: 2021-07-26 | Disposition: A | Discharge status: Home / Self Care | Payer: PPO | Source: Ambulatory Visit | Attending: Pulmonary Disease | Admitting: Pulmonary Disease

## 2021-07-26 ENCOUNTER — Ambulatory Visit: Payer: PPO | Admitting: Family

## 2021-07-26 DIAGNOSIS — J9601 Acute respiratory failure with hypoxia: Secondary | ICD-10-CM | POA: Insufficient documentation

## 2021-07-26 LAB — D-DIMER, QUANTITATIVE: D-Dimer, Quant: 1.58 ug/mL-FEU — ABNORMAL HIGH (ref 0.00–0.50)

## 2021-07-27 ENCOUNTER — Other Ambulatory Visit: Payer: Self-pay | Admitting: Pulmonary Disease

## 2021-07-27 ENCOUNTER — Other Ambulatory Visit (HOSPITAL_COMMUNITY): Payer: Self-pay | Admitting: Pulmonary Disease

## 2021-07-27 DIAGNOSIS — I129 Hypertensive chronic kidney disease with stage 1 through stage 4 chronic kidney disease, or unspecified chronic kidney disease: Secondary | ICD-10-CM | POA: Diagnosis not present

## 2021-07-27 DIAGNOSIS — R7989 Other specified abnormal findings of blood chemistry: Secondary | ICD-10-CM

## 2021-07-27 DIAGNOSIS — N184 Chronic kidney disease, stage 4 (severe): Secondary | ICD-10-CM | POA: Diagnosis not present

## 2021-07-27 DIAGNOSIS — D631 Anemia in chronic kidney disease: Secondary | ICD-10-CM | POA: Diagnosis not present

## 2021-07-28 ENCOUNTER — Other Ambulatory Visit: Payer: Self-pay

## 2021-07-28 ENCOUNTER — Other Ambulatory Visit: Payer: Self-pay | Admitting: Student

## 2021-07-28 ENCOUNTER — Ambulatory Visit
Admission: RE | Admit: 2021-07-28 | Discharge: 2021-07-28 | Disposition: A | Discharge status: Home / Self Care | Payer: PPO | Source: Ambulatory Visit | Attending: Pulmonary Disease | Admitting: Pulmonary Disease

## 2021-07-28 ENCOUNTER — Ambulatory Visit
Admission: RE | Admit: 2021-07-28 | Discharge: 2021-07-28 | Disposition: A | Discharge status: Home / Self Care | Payer: PPO | Source: Ambulatory Visit | Attending: Student | Admitting: Student

## 2021-07-28 ENCOUNTER — Other Ambulatory Visit: Payer: Self-pay | Admitting: Pulmonary Disease

## 2021-07-28 DIAGNOSIS — R7989 Other specified abnormal findings of blood chemistry: Secondary | ICD-10-CM

## 2021-07-28 DIAGNOSIS — I10 Essential (primary) hypertension: Secondary | ICD-10-CM | POA: Diagnosis not present

## 2021-07-28 DIAGNOSIS — N2581 Secondary hyperparathyroidism of renal origin: Secondary | ICD-10-CM | POA: Diagnosis not present

## 2021-07-28 DIAGNOSIS — Z9889 Other specified postprocedural states: Secondary | ICD-10-CM | POA: Diagnosis not present

## 2021-07-28 DIAGNOSIS — J90 Pleural effusion, not elsewhere classified: Secondary | ICD-10-CM

## 2021-07-28 DIAGNOSIS — J9611 Chronic respiratory failure with hypoxia: Secondary | ICD-10-CM | POA: Diagnosis not present

## 2021-07-28 DIAGNOSIS — N184 Chronic kidney disease, stage 4 (severe): Secondary | ICD-10-CM | POA: Diagnosis not present

## 2021-07-28 DIAGNOSIS — I255 Ischemic cardiomyopathy: Secondary | ICD-10-CM | POA: Diagnosis not present

## 2021-07-28 DIAGNOSIS — I5023 Acute on chronic systolic (congestive) heart failure: Secondary | ICD-10-CM | POA: Diagnosis not present

## 2021-07-28 DIAGNOSIS — I517 Cardiomegaly: Secondary | ICD-10-CM | POA: Diagnosis not present

## 2021-07-28 DIAGNOSIS — E782 Mixed hyperlipidemia: Secondary | ICD-10-CM | POA: Diagnosis not present

## 2021-07-28 DIAGNOSIS — R0602 Shortness of breath: Secondary | ICD-10-CM | POA: Diagnosis not present

## 2021-07-28 DIAGNOSIS — I2510 Atherosclerotic heart disease of native coronary artery without angina pectoris: Secondary | ICD-10-CM | POA: Diagnosis not present

## 2021-07-28 MED ORDER — TECHNETIUM TO 99M ALBUMIN AGGREGATED
4.0000 | Freq: Once | INTRAVENOUS | Status: AC | PRN
  Administered 2021-07-28: 4.7 via INTRAVENOUS

## 2021-07-29 DIAGNOSIS — I509 Heart failure, unspecified: Secondary | ICD-10-CM | POA: Diagnosis not present

## 2021-07-29 DIAGNOSIS — I5023 Acute on chronic systolic (congestive) heart failure: Secondary | ICD-10-CM | POA: Diagnosis not present

## 2021-07-31 DIAGNOSIS — J811 Chronic pulmonary edema: Secondary | ICD-10-CM | POA: Diagnosis not present

## 2021-07-31 DIAGNOSIS — I517 Cardiomegaly: Secondary | ICD-10-CM | POA: Diagnosis not present

## 2021-07-31 DIAGNOSIS — J90 Pleural effusion, not elsewhere classified: Secondary | ICD-10-CM | POA: Diagnosis not present

## 2021-07-31 DIAGNOSIS — J9690 Respiratory failure, unspecified, unspecified whether with hypoxia or hypercapnia: Secondary | ICD-10-CM | POA: Diagnosis not present

## 2021-08-01 DIAGNOSIS — N185 Chronic kidney disease, stage 5: Secondary | ICD-10-CM | POA: Diagnosis not present

## 2021-08-01 DIAGNOSIS — N2581 Secondary hyperparathyroidism of renal origin: Secondary | ICD-10-CM | POA: Diagnosis not present

## 2021-08-01 DIAGNOSIS — I129 Hypertensive chronic kidney disease with stage 1 through stage 4 chronic kidney disease, or unspecified chronic kidney disease: Secondary | ICD-10-CM | POA: Diagnosis not present

## 2021-08-01 DIAGNOSIS — D631 Anemia in chronic kidney disease: Secondary | ICD-10-CM | POA: Diagnosis not present

## 2021-08-03 ENCOUNTER — Ambulatory Visit: Payer: PPO | Attending: Family | Admitting: Family

## 2021-08-03 ENCOUNTER — Other Ambulatory Visit: Payer: Self-pay

## 2021-08-03 ENCOUNTER — Encounter: Payer: Self-pay | Admitting: Family

## 2021-08-03 VITALS — BP 100/67 | HR 58 | Resp 16 | Ht 71.0 in | Wt 143.0 lb

## 2021-08-03 DIAGNOSIS — I10 Essential (primary) hypertension: Secondary | ICD-10-CM

## 2021-08-03 DIAGNOSIS — I890 Lymphedema, not elsewhere classified: Secondary | ICD-10-CM | POA: Diagnosis not present

## 2021-08-03 DIAGNOSIS — R0981 Nasal congestion: Secondary | ICD-10-CM | POA: Insufficient documentation

## 2021-08-03 DIAGNOSIS — I132 Hypertensive heart and chronic kidney disease with heart failure and with stage 5 chronic kidney disease, or end stage renal disease: Secondary | ICD-10-CM | POA: Insufficient documentation

## 2021-08-03 DIAGNOSIS — R0602 Shortness of breath: Secondary | ICD-10-CM | POA: Diagnosis not present

## 2021-08-03 DIAGNOSIS — I5022 Chronic systolic (congestive) heart failure: Secondary | ICD-10-CM

## 2021-08-03 DIAGNOSIS — I719 Aortic aneurysm of unspecified site, without rupture: Secondary | ICD-10-CM | POA: Diagnosis not present

## 2021-08-03 DIAGNOSIS — I2510 Atherosclerotic heart disease of native coronary artery without angina pectoris: Secondary | ICD-10-CM | POA: Insufficient documentation

## 2021-08-03 DIAGNOSIS — Z87891 Personal history of nicotine dependence: Secondary | ICD-10-CM | POA: Diagnosis not present

## 2021-08-03 DIAGNOSIS — K219 Gastro-esophageal reflux disease without esophagitis: Secondary | ICD-10-CM | POA: Insufficient documentation

## 2021-08-03 DIAGNOSIS — R5383 Other fatigue: Secondary | ICD-10-CM | POA: Diagnosis not present

## 2021-08-03 DIAGNOSIS — Z09 Encounter for follow-up examination after completed treatment for conditions other than malignant neoplasm: Secondary | ICD-10-CM | POA: Diagnosis not present

## 2021-08-03 DIAGNOSIS — E785 Hyperlipidemia, unspecified: Secondary | ICD-10-CM | POA: Diagnosis not present

## 2021-08-03 DIAGNOSIS — N186 End stage renal disease: Secondary | ICD-10-CM | POA: Diagnosis not present

## 2021-08-03 DIAGNOSIS — R238 Other skin changes: Secondary | ICD-10-CM | POA: Insufficient documentation

## 2021-08-03 NOTE — Progress Notes (Signed)
Patient ID: [name], male    DOB: 12-03-1930, 87 y.o.   MRN: [id number]   Mr Schiff is a 87 y/o male with a history of CAD, hyperlipidemia, HTN, CKD, aneami, aortic aneurysm, GERD, MGUS, previous tobacco use and chronic heart failure.   Echo report from 01/31/21 reviewed and showed an EF of <20% along with mild/moderate MR and mild AS.   Admitted 05/29/21 due to shortness of breath. Found to be in AKI. Initially given IV lasix with transition to oral diuretics. Nephrology consulted. Speech therapy evaluated patient and barium swallow was obtained which showed minimal hiatal hernia. Discharged after 3 days. Was in the ED 03/09/21 due to constipation where he was treated and released.   He presents today for a follow-up visit with a chief complaint of moderate fatigue upon minimal exertion. He describes this as chronic in nature having been present for several years. He has associated nasal congestion, shortness of breath, easy bruising and weight loss along with this. He denies any difficultly sleeping, dizziness, cough, chest pain, pedal edema, palpitations, abdominal distention or weight gain.   Says that he has food to eat but just doesn't have much of an appetite. Says that he does drink 1 can of ensure every day.   Says that he will be starting dialysis soon and has appointment to get port-a-cath placed for dialysis access. He admits that he's not excited about starting dialysis but understands that he doesn't have much option and is hoping he will feel better once it starts.   Past Medical History:  Diagnosis Date   Anemia    Aortic aneurysm (HCC)    Aortic aneurysm (HCC)    Arthritis    CHF (congestive heart failure) (HCC)    CKD (chronic kidney disease)    ALSO LEFT RENAL MASS   Coronary artery disease    Diverticulosis    WITH RUPTURE   GERD (gastroesophageal reflux disease)    History of hiatal hernia    Hypercholesteremia    Hypertension    MGUS (monoclonal gammopathy  of unknown significance)    MGUS (monoclonal gammopathy of unknown significance)    Past Surgical History:  Procedure Laterality Date   AORTIC VALVE REPLACEMENT     bipass     CARDIAC VALVE REPLACEMENT     pig valve   CHOLECYSTECTOMY     COLON SURGERY     CORONARY ANGIOPLASTY     CORONARY ARTERY BYPASS GRAFT     ESOPHAGOGASTRODUODENOSCOPY (EGD) WITH PROPOFOL N/A 02/07/2015   Procedure: ESOPHAGOGASTRODUODENOSCOPY (EGD) WITH PROPOFOL;  Surgeon: Hulen Luster, MD;  Location: [facility];  Service: Gastroenterology;  Laterality: N/A;   EYE SURGERY     HERNIA REPAIR     INTRAMEDULLARY (IM) NAIL INTERTROCHANTERIC Right 05/12/2020   Procedure: INTRAMEDULLARY (IM) NAIL INTERTROCHANTRIC;  Surgeon: Lovell Sheehan, MD;  Location: [facility] ORS;  Service: Orthopedics;  Laterality: Right;   JOINT REPLACEMENT     left knee x4   KNEE ARTHROPLASTY Right 04/02/2016   Procedure: COMPUTER ASSISTED TOTAL KNEE ARTHROPLASTY;  Surgeon: Dereck Leep, MD;  Location: [facility] ORS;  Service: Orthopedics;  Laterality: Right;   ORIF FEMUR FRACTURE Right 08/08/2020   Procedure: OPEN REDUCTION INTERNAL FIXATION (ORIF) DISTAL FEMUR FRACTURE;  Surgeon: Lovell Sheehan, MD;  Location: [facility] ORS;  Service: Orthopedics;  Laterality: Right;   Family History  Problem Relation Age of Onset   Hypertension Mother    Heart failure Mother    Heart failure Father  Bladder Cancer Neg Hx    Prostate cancer Neg Hx    Kidney cancer Neg Hx    Social History   Tobacco Use   Smoking status: Former   Smokeless tobacco: Never  Substance Use Topics   Alcohol use: No   Allergies  Allergen Reactions   Morphine Anaphylaxis   Oxycodone Other (See Comments)    Hypotension and bradycardia   Carvedilol     Dizziness and syncope    Iodinated Diagnostic Agents Rash    Other reaction(s): Asthenia (finding), Other (qualifier value) paralyzed legs Other reaction(s): RASH    Lisinopril Cough and Other (See Comments)   Prior to  Admission medications   Medication Sig Start Date End Date Taking? Authorizing Provider  albuterol (VENTOLIN HFA) 108 (90 Base) MCG/ACT inhaler Inhale into the lungs. 01/16/21 01/16/22 Yes [provider]  amiodarone (PACERONE) 200 MG tablet Take 1 tablet (200 mg total) by mouth daily. 06/01/21  Yes Jennye Boroughs, MD  aspirin EC 81 MG tablet Take 1 tablet (81 mg total) by mouth 2 (two) times daily. Swallow whole. 08/10/20  Yes Donne Hazel, MD  losartan (COZAAR) 25 MG tablet Take 1 tablet (25 mg total) by mouth daily. 02/08/21  Yes Wouk, Ailene Rud, MD  Melatonin 3 MG TABS Take 10 mg by mouth at bedtime as needed (sleep).   Yes [provider]  omeprazole (PRILOSEC OTC) 20 MG tablet Take 1 tablet (20 mg total) by mouth daily. 06/01/21 06/01/22 Yes Jennye Boroughs, MD  pravastatin (PRAVACHOL) 20 MG tablet Take 20 mg by mouth every evening.   Yes [provider]  torsemide (DEMADEX) 20 MG tablet Take 1 tablet (20 mg total) by mouth daily. 06/01/21  Yes Jennye Boroughs, MD  traMADol (ULTRAM) 50 MG tablet Take 50 mg by mouth every 4 (four) hours as needed. 08/16/20  Yes [provider]   Review of Systems  Constitutional:  Positive for fatigue (tire easily). Negative for appetite change.  HENT:  Positive for congestion. Negative for postnasal drip and sore throat.   Eyes: Negative.   Respiratory:  Positive for shortness of breath. Negative for cough and chest tightness.   Cardiovascular:  Negative for chest pain, palpitations and leg swelling.  Gastrointestinal:  Negative for abdominal distention and abdominal pain.  Endocrine: Negative.   Genitourinary: Negative.   Musculoskeletal:  Positive for arthralgias (left knee).  Skin: Negative.   Allergic/Immunologic: Negative.   Neurological:  Negative for dizziness and light-headedness.  Hematological:  Negative for adenopathy. Bruises/bleeds easily.  Psychiatric/Behavioral:  Negative for dysphoric mood and sleep  disturbance (wearing oxygen @2L  at bedtime and PRN during the day). The patient is not nervous/anxious.    Vitals:   08/03/21 1431  BP: 100/67  Pulse: (!) 58  Resp: 16  SpO2: 100%  Weight: 143 lb (64.9 kg)  Height: 5\' 11"  (1.803 m)   Wt Readings from Last 3 Encounters:  08/03/21 143 lb (64.9 kg)  06/01/21 157 lb 6.5 oz (71.4 kg)  05/19/21 169 lb (76.7 kg)   Lab Results  Component Value Date   CREATININE 3.75 (H) 05/31/2021   CREATININE 3.88 (H) 05/30/2021   CREATININE 4.01 (H) 05/29/2021   Physical Exam Vitals and nursing note reviewed. Exam conducted with a chaperone present (son).  Constitutional:      Appearance: Normal appearance.  HENT:     Head: Normocephalic and atraumatic.  Cardiovascular:     Rate and Rhythm: Regular rhythm. Bradycardia present.  Pulmonary:  Effort: Pulmonary effort is normal. No respiratory distress.     Breath sounds: No wheezing or rales.  Abdominal:     General: There is no distension.     Palpations: Abdomen is soft.     Tenderness: There is no abdominal tenderness.  Musculoskeletal:        General: No tenderness.     Cervical back: Normal range of motion and neck supple.     Right lower leg: No edema.     Left lower leg: No edema.  Skin:    General: Skin is warm and dry.  Neurological:     General: No focal deficit present.     Mental Status: He is alert and oriented to person, place, and time.  Psychiatric:        Mood and Affect: Mood normal.        Behavior: Behavior normal.        Thought Content: Thought content normal.   Assessment & Plan:  1: Chronic heart failure with reduced ejection fraction- - NYHA class III - euvolemic today - weighing daily & notes a gradual weight gain; reminded to call for an overnight weight gain of > 2 pounds or a weekly weight gain of >5 pounds - weight down 26 pounds from last visit here 2 months ago; he says that he doesn't have much of an appetite and drinks 1 can of ensure daily - not  adding salt to his food and family doesn't cook with salt - saw cardiology Nehemiah Massed) 05/25/21 - saw pulmonology Lanney Gins) 07/26/21 - on GDMT of losartan; BP will not allow for titration - had dizziness/ syncope with carvedilol - renal function will not allow for spironolactone or SGLT2 - drinking ~48 ounces of fluid/ day per nephrology - BNP 05/29/21 was 4370.9  2: HTN- - BP on the low side (100/67) - saw PCP Edwina Barth) 07/28/21 - BMP 07/27/21 reviewed and showed sodium 133, potassium 3.9, creatinine 4.14 and GFR 13  3: Lymphedema- - resolved - no need for compression boots  4: ESRD- - saw nephrology Holley Raring) 08/01/21 - son & patient say that patient will be getting port placed soon to begin dialysis   Medication list reviewed.  Due to HF stability & upcoming dialysis starting, will not make a return appointment at this time. Emphasized that he maintain close contact with cardiology but that he could call back at anytime to make another appointment and he was comfortable with this plan.

## 2021-08-03 NOTE — Patient Instructions (Addendum)
Continue weighing daily and call for an overnight weight gain of > 2 pounds or a weekly weight gain of >5 pounds.     Call us if you need [name] for anything

## 2021-08-04 ENCOUNTER — Encounter: Payer: Self-pay | Admitting: Family

## 2021-08-08 DIAGNOSIS — Z23 Encounter for immunization: Secondary | ICD-10-CM | POA: Diagnosis not present

## 2021-08-08 DIAGNOSIS — N184 Chronic kidney disease, stage 4 (severe): Secondary | ICD-10-CM | POA: Diagnosis not present

## 2021-08-08 DIAGNOSIS — S51811A Laceration without foreign body of right forearm, initial encounter: Secondary | ICD-10-CM | POA: Diagnosis not present

## 2021-08-12 DIAGNOSIS — I5023 Acute on chronic systolic (congestive) heart failure: Secondary | ICD-10-CM | POA: Diagnosis not present

## 2021-08-12 DIAGNOSIS — I509 Heart failure, unspecified: Secondary | ICD-10-CM | POA: Diagnosis not present

## 2021-08-15 DIAGNOSIS — S51811A Laceration without foreign body of right forearm, initial encounter: Secondary | ICD-10-CM | POA: Diagnosis not present

## 2021-08-15 DIAGNOSIS — I5023 Acute on chronic systolic (congestive) heart failure: Secondary | ICD-10-CM | POA: Diagnosis not present

## 2021-08-15 DIAGNOSIS — I2510 Atherosclerotic heart disease of native coronary artery without angina pectoris: Secondary | ICD-10-CM | POA: Diagnosis not present

## 2021-08-15 DIAGNOSIS — N2581 Secondary hyperparathyroidism of renal origin: Secondary | ICD-10-CM | POA: Diagnosis not present

## 2021-08-15 DIAGNOSIS — J9611 Chronic respiratory failure with hypoxia: Secondary | ICD-10-CM | POA: Diagnosis not present

## 2021-08-15 DIAGNOSIS — E782 Mixed hyperlipidemia: Secondary | ICD-10-CM | POA: Diagnosis not present

## 2021-08-15 DIAGNOSIS — N184 Chronic kidney disease, stage 4 (severe): Secondary | ICD-10-CM | POA: Diagnosis not present

## 2021-08-15 DIAGNOSIS — J90 Pleural effusion, not elsewhere classified: Secondary | ICD-10-CM | POA: Diagnosis not present

## 2021-08-15 DIAGNOSIS — I10 Essential (primary) hypertension: Secondary | ICD-10-CM | POA: Diagnosis not present

## 2021-08-18 ENCOUNTER — Ambulatory Visit: Payer: PPO | Admitting: Family

## 2021-08-18 DIAGNOSIS — Z9981 Dependence on supplemental oxygen: Secondary | ICD-10-CM | POA: Diagnosis not present

## 2021-08-18 DIAGNOSIS — I739 Peripheral vascular disease, unspecified: Secondary | ICD-10-CM | POA: Diagnosis not present

## 2021-08-18 DIAGNOSIS — I5023 Acute on chronic systolic (congestive) heart failure: Secondary | ICD-10-CM | POA: Diagnosis not present

## 2021-08-18 DIAGNOSIS — Z7982 Long term (current) use of aspirin: Secondary | ICD-10-CM | POA: Diagnosis not present

## 2021-08-18 DIAGNOSIS — R29898 Other symptoms and signs involving the musculoskeletal system: Secondary | ICD-10-CM | POA: Diagnosis not present

## 2021-08-18 DIAGNOSIS — J189 Pneumonia, unspecified organism: Secondary | ICD-10-CM | POA: Diagnosis not present

## 2021-08-18 DIAGNOSIS — I2510 Atherosclerotic heart disease of native coronary artery without angina pectoris: Secondary | ICD-10-CM | POA: Diagnosis not present

## 2021-08-18 DIAGNOSIS — E782 Mixed hyperlipidemia: Secondary | ICD-10-CM | POA: Diagnosis not present

## 2021-08-18 DIAGNOSIS — I10 Essential (primary) hypertension: Secondary | ICD-10-CM | POA: Diagnosis not present

## 2021-08-21 ENCOUNTER — Telehealth: Payer: Self-pay

## 2021-08-21 ENCOUNTER — Encounter: Payer: Self-pay | Admitting: Surgery

## 2021-08-21 ENCOUNTER — Ambulatory Visit: Payer: PPO | Admitting: Surgery

## 2021-08-21 ENCOUNTER — Other Ambulatory Visit: Payer: Self-pay

## 2021-08-21 VITALS — BP 98/64 | HR 58 | Temp 97.6°F | Ht 72.0 in | Wt 153.0 lb

## 2021-08-21 DIAGNOSIS — Z992 Dependence on renal dialysis: Secondary | ICD-10-CM | POA: Diagnosis not present

## 2021-08-21 DIAGNOSIS — N185 Chronic kidney disease, stage 5: Secondary | ICD-10-CM

## 2021-08-21 NOTE — Patient Instructions (Addendum)
Our surgery scheduler Pamala Hurry will call you within 24-48 hours to get you scheduled. If you have not heard from her after 48 hours, please call our office. You will not need to get Covid tested before surgery and have the blue sheet available when she calls to write down important information.  A clearance will be faxed to your Cardiologist, Primary Care, and Pulmonologist.    If you have any concerns or questions, please feel free to call our office.  Peritoneal Dialysis Catheter Placement Peritoneal dialysis catheter placement is a surgery to insert a thin, flexible tube (catheter) into the abdomen. The catheter will be used for peritoneal dialysis, which is a process for filtering the blood. The catheter is small, soft, and easy to conceal. The catheter placement is usually done at least 2 weeks before peritoneal dialysis is started. During dialysis, wastes, salt, and extra water are removed from the blood. In peritoneal dialysis, these tasks are performed by transferring a fluid (dialysate) to and from the abdomen during each session. The fluid goes through the catheter to enter the abdomen at the start of each dialysis session, and it drains out of the body through the catheter at the end of each session. This procedure is done using one of the following techniques: Open technique. This is when the surgery is performed through one large incision. Laparoscopic technique. This is when smaller incisions are made and a tube with a light and camera (laparoscope) is inserted through one of the incisions to help perform the surgery. The camera sends images to a video screen in the operating room. This lets the surgeon see inside the abdomen during the procedure. Tell a health care provider about: Any allergies you have. All medicines you are taking, including vitamins, herbs, eye drops, creams, and over-the-counter medicines. Any problems you or family members have had with anesthetic medicines. Any  blood disorders you have. Any surgeries you have had. Any history of smoking. Any medical conditions you have. Whether you are pregnant or may be pregnant. What are the risks? Generally, this is a safe procedure. However, problems may occur, including: Infection. Too much bleeding. A collection of blood near the incision (hematoma). Damage to blood vessels, tissues, or organs in the abdomen area. Allergic reactions to medicines. Pain or cramping. Slow healing. Catheter problems after the surgery. The catheter may: Become blocked. Move out of place. Poke or wrap around intestines. Allow fluid to leak around it. Scarring. Skin damage. What happens before the procedure? Staying hydrated Follow instructions from your health care provider about hydration, which may include: Up to 2 hours before the procedure - you may continue to drink clear liquids, such as water, clear fruit juice, black coffee, and plain tea.  Eating and drinking restrictions Follow instructions from your health care provider about eating and drinking, which may include: 8 hours before the procedure - stop eating heavy meals or foods, such as meat, fried foods, or fatty foods. 6 hours before the procedure - stop eating light meals or foods, such as toast or cereal. 6 hours before the procedure - stop drinking milk or drinks that contain milk. 2 hours before the procedure - stop drinking clear liquids. Medicines Ask your health care provider about: Changing or stopping your regular medicines. This is especially important if you are taking diabetes medicines or blood thinners. Taking medicines such as aspirin and ibuprofen. These medicines can thin your blood. Do not take these medicines unless your health care provider tells you to  take them. Taking over-the-counter medicines, vitamins, herbs, and supplements. Surgery safety Ask your health care provider: How your surgery site will be marked. What steps will be  taken to help prevent infection. These steps may include: Removing hair at the surgery site. Washing skin with a germ-killing soap. Taking antibiotic medicine. General instructions You may have a CT scan or ultrasound of your abdomen. You may have a blood sample taken. Plan to have a responsible adult take you home from the hospital or clinic. Your health care provider will discuss the best site for the catheter to be placed. The site will be chosen: To help prevent the catheter from being flattened or damaged. To make it as comfortable as possible for you. What happens during the procedure? An IV will be inserted into one of your veins. You will be given one or both of the following: A medicine to help you relax (sedative). A medicine to numb the area (local anesthetic). If you are having an open surgery, one large incision will be made in the abdomen. If you are having laparoscopic surgery, small incisions will be made in the abdomen. A laparoscope and instruments will be put through the incisions. The catheter will be put in place. A short tunnel will be made under the skin to a location where the catheter exits the abdomen. Stitches (sutures) will be placed around the catheter to hold it in place. Your incisions will be closed with sutures or staples. The procedure may vary among health care providers and hospitals. What happens after the procedure? Your blood pressure, heart rate, breathing rate, and blood oxygen level will be monitored until you leave the hospital or clinic. You may have some pain. You will be given pain medicine as needed. You will be given instructions about how to care for your catheter and how it is used for the dialysis process. Summary Peritoneal dialysis catheter placement is a surgery to insert a thin, flexible tube (catheter) in your abdomen. This surgery must be done before you begin peritoneal dialysis. Before the procedure, your health care provider  will discuss the best site for the catheter to be placed. The site will be chosen to help prevent the catheter from being flattened or damaged, and to make it as comfortable as possible for you. After the procedure, you will be given instructions about how to care for your catheter and how it is used for the dialysis process. This information is not intended to replace advice given to you by your health care provider. Make sure you discuss any questions you have with your health care provider. Document Revised: 04/21/2020 Document Reviewed: 04/21/2020 Elsevier Patient Education  2022 [employer].

## 2021-08-21 NOTE — Telephone Encounter (Signed)
Faxed medical clearance to Dr. Harrel Lemon at [phone].

## 2021-08-21 NOTE — Telephone Encounter (Signed)
Faxed Pulmonary Clearance to Dr. Lanney Gins at [phone].

## 2021-08-21 NOTE — Telephone Encounter (Signed)
Faxed Cardiac Clearance to Dr. Serafina Royals at [phone].

## 2021-08-21 NOTE — Progress Notes (Signed)
08/21/2021  Reason for Visit: Peritoneal dialysis catheter placement  Requesting Provider:  Anthonette Legato, MD  History of Present Illness: [name] is a 87 y.o. male presenting for evaluation of peritoneal dialysis catheter placement due to progressively worsening chronic kidney disease, now at stage V.  The patient is followed by [facility] by Dr. Holley Raring.  The patient's GFR has been progressively decreasing and most recently has been between 15 and 17.  He was recently hospitalized on 05/29/2021 due to concerns for possible pneumonia as well as acute on chronic systolic congestive heart failure and AKI.  He was also complaining of difficulty swallowing and regurgitation of his food but a barium swallow did not show any esophageal pathology.  He did have a minimal hiatal hernia.  He also had an admission on 01/31/2021 with worsening dyspnea an echocardiogram done on 01/31/2021 showed a left ventricular ejection fraction of less than 20%.  With his worsening kidney function, and after further discussion his nephrology team, he has been referred for peritoneal dialysis catheter placement.  The patient has significant medical and surgical history with chronic kidney disease, hypertension, coronary artery disease status post CABG and stents in the past, aortic valve replacement, diverticulosis with episode of perforated diverticulitis status post Hartman's and subsequent ostomy reversal many years ago, abdominal aortic aneurysm, CHF with EF of less than 20%, prior orthopedic fractures status post repairs, MGUS, GERD, history of open cholecystectomy, and progressive dyspnea.  He does use 2 L of nasal cannula at night.  He walks with a walker and reports that he will get short winded if he walked from the parking lot over to the exam room today.  He has follow-up appointments with his PCP on 08/23/2021, with his cardiologist on 08/24/2021, and with his pulmonologist on  09/28/2021.  Past Medical History: Past Medical History:  Diagnosis Date   Anemia    Aortic aneurysm (HCC)    Aortic aneurysm (HCC)    Arthritis    CHF (congestive heart failure) (HCC)    CKD (chronic kidney disease)    ALSO LEFT RENAL MASS   Coronary artery disease    Diverticulosis    History of perforated diverticulitis   GERD (gastroesophageal reflux disease)    History of hiatal hernia    Hypercholesteremia    Hypertension    MGUS (monoclonal gammopathy of unknown significance)    MGUS (monoclonal gammopathy of unknown significance)      Past Surgical History: Past Surgical History:  Procedure Laterality Date   AORTIC VALVE REPLACEMENT     bipass     CARDIAC VALVE REPLACEMENT     pig valve   CHOLECYSTECTOMY     Open   COLON SURGERY     History of Hartman's procedure with subsequent colostomy reversal   CORONARY ANGIOPLASTY     CORONARY ARTERY BYPASS GRAFT     ESOPHAGOGASTRODUODENOSCOPY (EGD) WITH PROPOFOL N/A 02/07/2015   Procedure: ESOPHAGOGASTRODUODENOSCOPY (EGD) WITH PROPOFOL;  Surgeon: Hulen Luster, MD;  Location: [facility] ENDOSCOPY;  Service: Gastroenterology;  Laterality: N/A;   EYE SURGERY     HERNIA REPAIR     INTRAMEDULLARY (IM) NAIL INTERTROCHANTERIC Right 05/12/2020   Procedure: INTRAMEDULLARY (IM) NAIL INTERTROCHANTRIC;  Surgeon: Lovell Sheehan, MD;  Location: [facility] ORS;  Service: Orthopedics;  Laterality: Right;   JOINT REPLACEMENT     left knee x4   KNEE ARTHROPLASTY Right 04/02/2016   Procedure: COMPUTER ASSISTED TOTAL KNEE ARTHROPLASTY;  Surgeon: Dereck Leep, MD;  Location:  [facility] ORS;  Service: Orthopedics;  Laterality: Right;   ORIF FEMUR FRACTURE Right 08/08/2020   Procedure: OPEN REDUCTION INTERNAL FIXATION (ORIF) DISTAL FEMUR FRACTURE;  Surgeon: Lovell Sheehan, MD;  Location: [facility] ORS;  Service: Orthopedics;  Laterality: Right;    Home Medications: Prior to Admission medications   Medication Sig Start Date End Date Taking? Authorizing Provider   albuterol (VENTOLIN HFA) 108 (90 Base) MCG/ACT inhaler Inhale into the lungs. 01/16/21 01/16/22 Yes [provider]  amiodarone (PACERONE) 200 MG tablet Take 1 tablet (200 mg total) by mouth daily. 06/01/21  Yes Jennye Boroughs, MD  aspirin EC 81 MG tablet Take 1 tablet (81 mg total) by mouth 2 (two) times daily. Swallow whole. 08/10/20  Yes Donne Hazel, MD  losartan (COZAAR) 25 MG tablet Take 1 tablet (25 mg total) by mouth daily. 02/08/21  Yes Wouk, Ailene Rud, MD  pravastatin (PRAVACHOL) 20 MG tablet Take 20 mg by mouth every evening.   Yes [provider]  torsemide (DEMADEX) 20 MG tablet Take 1 tablet (20 mg total) by mouth daily. 06/01/21  Yes Jennye Boroughs, MD  traMADol (ULTRAM) 50 MG tablet Take 50 mg by mouth every 4 (four) hours as needed. 08/16/20  Yes [provider]    Allergies: Allergies  Allergen Reactions   Morphine Anaphylaxis   Oxycodone Other (See Comments)    Hypotension and bradycardia   Carvedilol     Dizziness and syncope    Iodinated Diagnostic Agents Rash    Other reaction(s): Asthenia (finding), Other (qualifier value) paralyzed legs Other reaction(s): RASH    Lisinopril Cough and Other (See Comments)    Social History:  reports that he has quit smoking. He has never used smokeless tobacco. He reports that he does not drink alcohol and does not use drugs.   Family History: Family History  Problem Relation Age of Onset   Hypertension Mother    Heart failure Mother    Heart failure Father    Bladder Cancer Neg Hx    Prostate cancer Neg Hx    Kidney cancer Neg Hx     Review of Systems: Review of Systems  Constitutional:  Negative for chills and fever.  HENT:  Negative for hearing loss.   Respiratory:  Positive for shortness of breath.   Cardiovascular:  Negative for chest pain.  Gastrointestinal:  Negative for abdominal pain, constipation, diarrhea, nausea and vomiting.  Genitourinary:  Negative for dysuria.   Musculoskeletal:  Negative for myalgias.  Skin:  Negative for rash.  Neurological:  Negative for dizziness.  Psychiatric/Behavioral:  Negative for depression.    Physical Exam BP 98/64   Pulse (!) 58   Temp 97.6 F (36.4 C) (Oral)   Ht 6' (1.829 m)   Wt 153 lb (69.4 kg)   SpO2 92%   BMI 20.75 kg/m  CONSTITUTIONAL: No acute distress, but does appear somewhat frail HEENT:  Normocephalic, atraumatic, extraocular motion intact. NECK: Trachea is midline, and there is no jugular venous distension.  RESPIRATORY: Lungs are mildly coarse bilaterally. Normal respiratory effort without pathologic use of accessory muscles. CARDIOVASCULAR: Regular rhythm and rate. GI: The abdomen is soft, nondistended, nontender to palpation.  Patient has multiple scars consistent with his prior surgeries.  MUSCULOSKELETAL:  Normal muscle strength and tone in all four extremities.  No peripheral edema or cyanosis. SKIN: Skin turgor is normal. There are no pathologic skin lesions.  NEUROLOGIC:  Motor and sensation is grossly normal.  Cranial nerves are grossly intact. PSYCH:  Alert and oriented to person, place and time. Affect is normal.  Laboratory Analysis: Labs from 08/08/2021: Sodium 130, potassium 4.4, chloride 97, CO2 22.5, BUN 110, creatinine 3.5, GFR 17.  WBC 4.2, hemoglobin 10.5, hematocrit 32, platelets 197.  Imaging: VQ scan on 07/28/2021: IMPRESSION: 1. No evidence acute pulmonary embolism. 2. Decreased perfusion to the posterior LEFT lower lobe matches dense consolidation in the LEFT lower lobe on comparison same day radiograph. Potential pneumonia or dense atelectasis  Echocardiogram on 01/31/2021: IMPRESSIONS   1. Left ventricular ejection fraction, by estimation, is <20%. The left  ventricle has severely decreased function. The left ventricle has no  regional wall motion abnormalities. The left ventricular internal cavity  size was mildly dilated. Left ventricular diastolic parameters  were normal.   2. Right ventricular systolic function is normal. The right ventricular  size is mildly enlarged.   3. Left atrial size was mildly dilated.   4. Right atrial size was mildly dilated.   5. The mitral valve is grossly normal. Mild to moderate mitral valve  regurgitation.   6. Tricuspid valve regurgitation is moderate.   7. The aortic valve is grossly normal. Aortic valve regurgitation is  trivial. Mild aortic valve stenosis.   8. Aortic dilatation noted.   Assessment and Plan: This is a 87 y.o. male with chronic kidney disease stage V, now approaching need for dialysis.  - Discussed with the patient that he has pretty significant medical conditions that at this point I do not know how well he could tolerate surgery from those standpoints.  His heart function is very diminished, he is requiring 2 L nasal cannula at night, and he has had multiple prior abdominal surgeries.  I think prior to surgery, he would definitely need clearances from his PCP, cardiologist, and pulmonologist.  I do not know if he would be able to tolerate general anesthesia for surgery.  At this point, might surgical plan would be a robotic assisted lysis of adhesions with peritoneal dialysis catheter placement.  Discussed with him that likely given his prior abdominal surgeries, he would have adhesions and will need to be lysed in order to make enough room for the catheter placement in the pelvis and this will require general anesthesia.  Discussed with him that he would also need to stop his aspirin 5 days prior to surgery. - Discussed with the patient and his family the risks of bleeding, infection, injury to surrounding structures, that this is potentially an outpatient procedure, postoperative restrictions, pain control.  Currently has multiple questions about how peritoneal dialysis works, the machine that is used, the size of it, the difficulty of this, and I have asked him to call the nephrology team for  further information on this. - Tentatively, I will schedule him for robotic assisted peritoneal dialysis catheter placement and lysis of adhesions on 09/19/2021.  However again, I emphasized that this will be pending completely on him being cleared for surgery by all 3 teams.  If unable to get cleared, then potentially he may require hemodialysis and a referral for vascular surgery or interventional radiology for dialysis catheter placement versus AV fistula.  If he needs further work-up from his teams prior to being cleared, we may need to postpone the surgery date and he is aware of this and in agreement.  I spent 80 minutes dedicated to the care of this patient on the date of this encounter to include pre-visit review of records, face-to-face time with the patient discussing diagnosis and management,  and any post-visit coordination of care.   Melvyn Neth, [facility]

## 2021-08-22 ENCOUNTER — Telehealth: Payer: Self-pay | Admitting: Surgery

## 2021-08-22 NOTE — Telephone Encounter (Signed)
Patient has been advised of Pre-Admission date/time, COVID Testing date and Surgery date.  Surgery Date: 09/19/21 Preadmission Testing Date: 09/05/21 (phone 7:30 am -1:00 pm) Covid Testing Date: Not needed.     Patient has been made aware to call [phone], between 1-3:00pm the day before surgery, to find out what time to arrive for surgery.    Also is surgery remains on 09/19/21 per  Dr. Hampton Abbot patient's last does of aspirin to be on 09/13/21, patient informed of this as well and verbalized understanding.

## 2021-08-23 DIAGNOSIS — S51811A Laceration without foreign body of right forearm, initial encounter: Secondary | ICD-10-CM | POA: Diagnosis not present

## 2021-08-24 DIAGNOSIS — E782 Mixed hyperlipidemia: Secondary | ICD-10-CM | POA: Diagnosis not present

## 2021-08-24 DIAGNOSIS — I700 Atherosclerosis of aorta: Secondary | ICD-10-CM | POA: Diagnosis not present

## 2021-08-24 DIAGNOSIS — I350 Nonrheumatic aortic (valve) stenosis: Secondary | ICD-10-CM | POA: Diagnosis not present

## 2021-08-24 DIAGNOSIS — I714 Abdominal aortic aneurysm, without rupture, unspecified: Secondary | ICD-10-CM | POA: Diagnosis not present

## 2021-08-24 DIAGNOSIS — I255 Ischemic cardiomyopathy: Secondary | ICD-10-CM | POA: Diagnosis not present

## 2021-08-24 DIAGNOSIS — I071 Rheumatic tricuspid insufficiency: Secondary | ICD-10-CM | POA: Diagnosis not present

## 2021-08-24 DIAGNOSIS — I5023 Acute on chronic systolic (congestive) heart failure: Secondary | ICD-10-CM | POA: Diagnosis not present

## 2021-08-24 DIAGNOSIS — I77810 Thoracic aortic ectasia: Secondary | ICD-10-CM | POA: Diagnosis not present

## 2021-08-24 DIAGNOSIS — I10 Essential (primary) hypertension: Secondary | ICD-10-CM | POA: Diagnosis not present

## 2021-08-24 DIAGNOSIS — I471 Supraventricular tachycardia: Secondary | ICD-10-CM | POA: Diagnosis not present

## 2021-08-24 DIAGNOSIS — I2510 Atherosclerotic heart disease of native coronary artery without angina pectoris: Secondary | ICD-10-CM | POA: Diagnosis not present

## 2021-08-24 DIAGNOSIS — R0602 Shortness of breath: Secondary | ICD-10-CM | POA: Diagnosis not present

## 2021-08-28 DIAGNOSIS — I5023 Acute on chronic systolic (congestive) heart failure: Secondary | ICD-10-CM | POA: Diagnosis not present

## 2021-08-28 DIAGNOSIS — I509 Heart failure, unspecified: Secondary | ICD-10-CM | POA: Diagnosis not present

## 2021-08-28 NOTE — Progress Notes (Unsigned)
Medical Clearance has been received from Dr Tillman Sers office. The patient is cleared at Low risk.

## 2021-08-29 ENCOUNTER — Ambulatory Visit: Payer: PPO | Admitting: Physician Assistant

## 2021-08-29 ENCOUNTER — Telehealth: Payer: Self-pay

## 2021-08-29 DIAGNOSIS — N185 Chronic kidney disease, stage 5: Secondary | ICD-10-CM | POA: Diagnosis not present

## 2021-08-29 DIAGNOSIS — I509 Heart failure, unspecified: Secondary | ICD-10-CM | POA: Diagnosis not present

## 2021-08-29 DIAGNOSIS — I480 Paroxysmal atrial fibrillation: Secondary | ICD-10-CM | POA: Diagnosis not present

## 2021-08-29 DIAGNOSIS — Z9981 Dependence on supplemental oxygen: Secondary | ICD-10-CM | POA: Diagnosis not present

## 2021-08-29 NOTE — Telephone Encounter (Signed)
Received Cardiac Clearance from Dr. Nehemiah Massed. Pt's risk assessment is low and is optimized for surgery.

## 2021-08-31 ENCOUNTER — Telehealth: Payer: Self-pay

## 2021-08-31 DIAGNOSIS — D631 Anemia in chronic kidney disease: Secondary | ICD-10-CM | POA: Diagnosis not present

## 2021-08-31 DIAGNOSIS — I129 Hypertensive chronic kidney disease with stage 1 through stage 4 chronic kidney disease, or unspecified chronic kidney disease: Secondary | ICD-10-CM | POA: Diagnosis not present

## 2021-08-31 DIAGNOSIS — N185 Chronic kidney disease, stage 5: Secondary | ICD-10-CM | POA: Diagnosis not present

## 2021-08-31 DIAGNOSIS — I5022 Chronic systolic (congestive) heart failure: Secondary | ICD-10-CM | POA: Diagnosis not present

## 2021-08-31 DIAGNOSIS — N2581 Secondary hyperparathyroidism of renal origin: Secondary | ICD-10-CM | POA: Diagnosis not present

## 2021-08-31 NOTE — Telephone Encounter (Signed)
Spoke with Clsea at [facility] office- patient is currently scheduled for additional testing 09/28/2021 for Pulmonary clearance--Patient also is scheduled for Echo /US 09/22/2021  Surgery scheduled 09/19/2021 but will need to be pushed back until all results are in from testing-notified Elizabeth regarding the above.

## 2021-08-31 NOTE — Telephone Encounter (Signed)
Called the patient back to get his surgery rescheduled to a later date.  Per the patient at this time, does not want to reschedule surgery until he gets all of his cardiac work up done.  Patient further states that he doesn't have an appointment with pulmonology yet.  Patient further states has an aneurysm in his abdomen and until he gets cleared with everything, does not want to reschedule surgery just yet.  Patient will call us back mid January or so to see where he is at that point.  Surgery at this time is cancelled and at patient request no reschedule at this time.  Prior to rescheduling, patient will need follow up in the office again with Dr. Hampton Abbot.  We will also keep watch with his medical treatment and follow up with him as well.

## 2021-09-04 DIAGNOSIS — N185 Chronic kidney disease, stage 5: Secondary | ICD-10-CM | POA: Diagnosis not present

## 2021-09-04 DIAGNOSIS — I129 Hypertensive chronic kidney disease with stage 1 through stage 4 chronic kidney disease, or unspecified chronic kidney disease: Secondary | ICD-10-CM | POA: Diagnosis not present

## 2021-09-05 ENCOUNTER — Other Ambulatory Visit: Payer: PPO

## 2021-09-06 ENCOUNTER — Telehealth: Payer: Self-pay

## 2021-09-06 DIAGNOSIS — J90 Pleural effusion, not elsewhere classified: Secondary | ICD-10-CM | POA: Diagnosis not present

## 2021-09-06 DIAGNOSIS — J9611 Chronic respiratory failure with hypoxia: Secondary | ICD-10-CM | POA: Diagnosis not present

## 2021-09-06 DIAGNOSIS — I5023 Acute on chronic systolic (congestive) heart failure: Secondary | ICD-10-CM | POA: Diagnosis not present

## 2021-09-06 DIAGNOSIS — I2510 Atherosclerotic heart disease of native coronary artery without angina pectoris: Secondary | ICD-10-CM | POA: Diagnosis not present

## 2021-09-06 DIAGNOSIS — I10 Essential (primary) hypertension: Secondary | ICD-10-CM | POA: Diagnosis not present

## 2021-09-06 DIAGNOSIS — N184 Chronic kidney disease, stage 4 (severe): Secondary | ICD-10-CM | POA: Diagnosis not present

## 2021-09-06 NOTE — Telephone Encounter (Signed)
Received Pulmonary clearance from Dr. Lanney Gins. Pt's risk assessment is low and is optimized for surgery.

## 2021-09-11 DIAGNOSIS — I5023 Acute on chronic systolic (congestive) heart failure: Secondary | ICD-10-CM | POA: Diagnosis not present

## 2021-09-11 DIAGNOSIS — I509 Heart failure, unspecified: Secondary | ICD-10-CM | POA: Diagnosis not present

## 2021-09-19 ENCOUNTER — Ambulatory Visit: Admit: 2021-09-19 | Payer: PPO | Admitting: Surgery

## 2021-09-19 SURGERY — LAPAROSCOPIC INSERTION CONTINUOUS AMBULATORY PERITONEAL DIALYSIS  (CAPD) CATHETER
Anesthesia: General

## 2021-09-22 DIAGNOSIS — R0602 Shortness of breath: Secondary | ICD-10-CM | POA: Diagnosis not present

## 2021-09-22 DIAGNOSIS — I714 Abdominal aortic aneurysm, without rupture, unspecified: Secondary | ICD-10-CM | POA: Diagnosis not present

## 2021-09-22 DIAGNOSIS — R062 Wheezing: Secondary | ICD-10-CM | POA: Diagnosis not present

## 2021-09-22 DIAGNOSIS — I255 Ischemic cardiomyopathy: Secondary | ICD-10-CM | POA: Diagnosis not present

## 2021-09-26 DIAGNOSIS — E785 Hyperlipidemia, unspecified: Secondary | ICD-10-CM | POA: Diagnosis not present

## 2021-09-26 DIAGNOSIS — I10 Essential (primary) hypertension: Secondary | ICD-10-CM | POA: Diagnosis not present

## 2021-09-26 DIAGNOSIS — Z7982 Long term (current) use of aspirin: Secondary | ICD-10-CM | POA: Diagnosis not present

## 2021-09-26 DIAGNOSIS — I2510 Atherosclerotic heart disease of native coronary artery without angina pectoris: Secondary | ICD-10-CM | POA: Diagnosis not present

## 2021-09-26 DIAGNOSIS — J90 Pleural effusion, not elsewhere classified: Secondary | ICD-10-CM | POA: Diagnosis not present

## 2021-09-26 DIAGNOSIS — I5023 Acute on chronic systolic (congestive) heart failure: Secondary | ICD-10-CM | POA: Diagnosis not present

## 2021-09-26 DIAGNOSIS — Z9981 Dependence on supplemental oxygen: Secondary | ICD-10-CM | POA: Diagnosis not present

## 2021-09-26 DIAGNOSIS — Z9181 History of falling: Secondary | ICD-10-CM | POA: Diagnosis not present

## 2021-09-26 DIAGNOSIS — J9611 Chronic respiratory failure with hypoxia: Secondary | ICD-10-CM | POA: Diagnosis not present

## 2021-09-26 DIAGNOSIS — N184 Chronic kidney disease, stage 4 (severe): Secondary | ICD-10-CM | POA: Diagnosis not present

## 2021-09-28 ENCOUNTER — Emergency Department: Payer: PPO

## 2021-09-28 ENCOUNTER — Other Ambulatory Visit: Payer: Self-pay

## 2021-09-28 ENCOUNTER — Inpatient Hospital Stay
Admission: EM | Admit: 2021-09-28 | Discharge: 2021-10-16 | DRG: 080 | Disposition: A | Discharge status: Skilled Nursing Facility | Payer: PPO | Attending: Internal Medicine | Admitting: Internal Medicine

## 2021-09-28 DIAGNOSIS — N179 Acute kidney failure, unspecified: Secondary | ICD-10-CM | POA: Diagnosis present

## 2021-09-28 DIAGNOSIS — Z9181 History of falling: Secondary | ICD-10-CM | POA: Diagnosis not present

## 2021-09-28 DIAGNOSIS — J90 Pleural effusion, not elsewhere classified: Secondary | ICD-10-CM | POA: Diagnosis not present

## 2021-09-28 DIAGNOSIS — Z7982 Long term (current) use of aspirin: Secondary | ICD-10-CM

## 2021-09-28 DIAGNOSIS — I10 Essential (primary) hypertension: Secondary | ICD-10-CM | POA: Diagnosis not present

## 2021-09-28 DIAGNOSIS — I350 Nonrheumatic aortic (valve) stenosis: Secondary | ICD-10-CM | POA: Diagnosis not present

## 2021-09-28 DIAGNOSIS — R570 Cardiogenic shock: Secondary | ICD-10-CM | POA: Diagnosis present

## 2021-09-28 DIAGNOSIS — R4182 Altered mental status, unspecified: Secondary | ICD-10-CM | POA: Diagnosis not present

## 2021-09-28 DIAGNOSIS — R0902 Hypoxemia: Secondary | ICD-10-CM | POA: Diagnosis not present

## 2021-09-28 DIAGNOSIS — R7989 Other specified abnormal findings of blood chemistry: Secondary | ICD-10-CM | POA: Diagnosis not present

## 2021-09-28 DIAGNOSIS — E871 Hypo-osmolality and hyponatremia: Secondary | ICD-10-CM | POA: Diagnosis present

## 2021-09-28 DIAGNOSIS — I248 Other forms of acute ischemic heart disease: Secondary | ICD-10-CM | POA: Diagnosis present

## 2021-09-28 DIAGNOSIS — N2581 Secondary hyperparathyroidism of renal origin: Secondary | ICD-10-CM | POA: Diagnosis present

## 2021-09-28 DIAGNOSIS — R0989 Other specified symptoms and signs involving the circulatory and respiratory systems: Secondary | ICD-10-CM

## 2021-09-28 DIAGNOSIS — E039 Hypothyroidism, unspecified: Secondary | ICD-10-CM | POA: Diagnosis not present

## 2021-09-28 DIAGNOSIS — I132 Hypertensive heart and chronic kidney disease with heart failure and with stage 5 chronic kidney disease, or end stage renal disease: Secondary | ICD-10-CM | POA: Diagnosis not present

## 2021-09-28 DIAGNOSIS — R601 Generalized edema: Secondary | ICD-10-CM | POA: Diagnosis not present

## 2021-09-28 DIAGNOSIS — N189 Chronic kidney disease, unspecified: Secondary | ICD-10-CM

## 2021-09-28 DIAGNOSIS — E872 Acidosis, unspecified: Secondary | ICD-10-CM | POA: Diagnosis not present

## 2021-09-28 DIAGNOSIS — Z7189 Other specified counseling: Secondary | ICD-10-CM | POA: Diagnosis not present

## 2021-09-28 DIAGNOSIS — I5023 Acute on chronic systolic (congestive) heart failure: Secondary | ICD-10-CM | POA: Diagnosis present

## 2021-09-28 DIAGNOSIS — D472 Monoclonal gammopathy: Secondary | ICD-10-CM | POA: Diagnosis present

## 2021-09-28 DIAGNOSIS — R54 Age-related physical debility: Secondary | ICD-10-CM | POA: Diagnosis present

## 2021-09-28 DIAGNOSIS — K219 Gastro-esophageal reflux disease without esophagitis: Secondary | ICD-10-CM | POA: Diagnosis present

## 2021-09-28 DIAGNOSIS — D696 Thrombocytopenia, unspecified: Secondary | ICD-10-CM | POA: Diagnosis not present

## 2021-09-28 DIAGNOSIS — I420 Dilated cardiomyopathy: Secondary | ICD-10-CM | POA: Diagnosis not present

## 2021-09-28 DIAGNOSIS — J9601 Acute respiratory failure with hypoxia: Secondary | ICD-10-CM | POA: Diagnosis present

## 2021-09-28 DIAGNOSIS — Z66 Do not resuscitate: Secondary | ICD-10-CM | POA: Diagnosis not present

## 2021-09-28 DIAGNOSIS — E7800 Pure hypercholesterolemia, unspecified: Secondary | ICD-10-CM | POA: Diagnosis not present

## 2021-09-28 DIAGNOSIS — L89312 Pressure ulcer of right buttock, stage 2: Secondary | ICD-10-CM | POA: Diagnosis not present

## 2021-09-28 DIAGNOSIS — Z8249 Family history of ischemic heart disease and other diseases of the circulatory system: Secondary | ICD-10-CM

## 2021-09-28 DIAGNOSIS — Z96653 Presence of artificial knee joint, bilateral: Secondary | ICD-10-CM | POA: Diagnosis present

## 2021-09-28 DIAGNOSIS — E785 Hyperlipidemia, unspecified: Secondary | ICD-10-CM | POA: Diagnosis not present

## 2021-09-28 DIAGNOSIS — N184 Chronic kidney disease, stage 4 (severe): Secondary | ICD-10-CM | POA: Diagnosis not present

## 2021-09-28 DIAGNOSIS — R5381 Other malaise: Secondary | ICD-10-CM | POA: Diagnosis not present

## 2021-09-28 DIAGNOSIS — I340 Nonrheumatic mitral (valve) insufficiency: Secondary | ICD-10-CM | POA: Diagnosis not present

## 2021-09-28 DIAGNOSIS — Z452 Encounter for adjustment and management of vascular access device: Secondary | ICD-10-CM

## 2021-09-28 DIAGNOSIS — Z951 Presence of aortocoronary bypass graft: Secondary | ICD-10-CM

## 2021-09-28 DIAGNOSIS — D638 Anemia in other chronic diseases classified elsewhere: Secondary | ICD-10-CM

## 2021-09-28 DIAGNOSIS — L89322 Pressure ulcer of left buttock, stage 2: Secondary | ICD-10-CM | POA: Diagnosis present

## 2021-09-28 DIAGNOSIS — N185 Chronic kidney disease, stage 5: Secondary | ICD-10-CM | POA: Diagnosis not present

## 2021-09-28 DIAGNOSIS — I480 Paroxysmal atrial fibrillation: Secondary | ICD-10-CM | POA: Diagnosis not present

## 2021-09-28 DIAGNOSIS — D649 Anemia, unspecified: Secondary | ICD-10-CM

## 2021-09-28 DIAGNOSIS — D631 Anemia in chronic kidney disease: Secondary | ICD-10-CM | POA: Diagnosis not present

## 2021-09-28 DIAGNOSIS — I739 Peripheral vascular disease, unspecified: Secondary | ICD-10-CM | POA: Diagnosis present

## 2021-09-28 DIAGNOSIS — I083 Combined rheumatic disorders of mitral, aortic and tricuspid valves: Secondary | ICD-10-CM | POA: Diagnosis present

## 2021-09-28 DIAGNOSIS — E035 Myxedema coma: Secondary | ICD-10-CM | POA: Diagnosis not present

## 2021-09-28 DIAGNOSIS — Z01818 Encounter for other preprocedural examination: Secondary | ICD-10-CM | POA: Diagnosis not present

## 2021-09-28 DIAGNOSIS — Z20822 Contact with and (suspected) exposure to covid-19: Secondary | ICD-10-CM | POA: Diagnosis present

## 2021-09-28 DIAGNOSIS — Z7401 Bed confinement status: Secondary | ICD-10-CM | POA: Diagnosis not present

## 2021-09-28 DIAGNOSIS — Z87891 Personal history of nicotine dependence: Secondary | ICD-10-CM

## 2021-09-28 DIAGNOSIS — I471 Supraventricular tachycardia: Secondary | ICD-10-CM | POA: Diagnosis not present

## 2021-09-28 DIAGNOSIS — R680 Hypothermia, not associated with low environmental temperature: Secondary | ICD-10-CM | POA: Diagnosis present

## 2021-09-28 DIAGNOSIS — Z9049 Acquired absence of other specified parts of digestive tract: Secondary | ICD-10-CM

## 2021-09-28 DIAGNOSIS — I509 Heart failure, unspecified: Secondary | ICD-10-CM | POA: Diagnosis not present

## 2021-09-28 DIAGNOSIS — I130 Hypertensive heart and chronic kidney disease with heart failure and stage 1 through stage 4 chronic kidney disease, or unspecified chronic kidney disease: Secondary | ICD-10-CM | POA: Diagnosis not present

## 2021-09-28 DIAGNOSIS — I2489 Other forms of acute ischemic heart disease: Secondary | ICD-10-CM

## 2021-09-28 DIAGNOSIS — Z91041 Radiographic dye allergy status: Secondary | ICD-10-CM

## 2021-09-28 DIAGNOSIS — R000 Tachycardia, unspecified: Secondary | ICD-10-CM | POA: Diagnosis not present

## 2021-09-28 DIAGNOSIS — N19 Unspecified kidney failure: Secondary | ICD-10-CM

## 2021-09-28 DIAGNOSIS — I2510 Atherosclerotic heart disease of native coronary artery without angina pectoris: Secondary | ICD-10-CM | POA: Diagnosis present

## 2021-09-28 DIAGNOSIS — R579 Shock, unspecified: Secondary | ICD-10-CM | POA: Diagnosis not present

## 2021-09-28 DIAGNOSIS — Z79899 Other long term (current) drug therapy: Secondary | ICD-10-CM

## 2021-09-28 DIAGNOSIS — R0689 Other abnormalities of breathing: Secondary | ICD-10-CM | POA: Diagnosis not present

## 2021-09-28 DIAGNOSIS — I714 Abdominal aortic aneurysm, without rupture, unspecified: Secondary | ICD-10-CM | POA: Diagnosis present

## 2021-09-28 DIAGNOSIS — I959 Hypotension, unspecified: Secondary | ICD-10-CM | POA: Diagnosis not present

## 2021-09-28 DIAGNOSIS — Z9981 Dependence on supplemental oxygen: Secondary | ICD-10-CM | POA: Diagnosis not present

## 2021-09-28 DIAGNOSIS — J811 Chronic pulmonary edema: Secondary | ICD-10-CM | POA: Diagnosis not present

## 2021-09-28 DIAGNOSIS — Z885 Allergy status to narcotic agent status: Secondary | ICD-10-CM

## 2021-09-28 DIAGNOSIS — I255 Ischemic cardiomyopathy: Secondary | ICD-10-CM | POA: Diagnosis present

## 2021-09-28 DIAGNOSIS — L89302 Pressure ulcer of unspecified buttock, stage 2: Secondary | ICD-10-CM | POA: Diagnosis present

## 2021-09-28 DIAGNOSIS — I517 Cardiomegaly: Secondary | ICD-10-CM | POA: Diagnosis not present

## 2021-09-28 DIAGNOSIS — N401 Enlarged prostate with lower urinary tract symptoms: Secondary | ICD-10-CM | POA: Diagnosis not present

## 2021-09-28 DIAGNOSIS — R531 Weakness: Secondary | ICD-10-CM | POA: Diagnosis not present

## 2021-09-28 DIAGNOSIS — Z888 Allergy status to other drugs, medicaments and biological substances status: Secondary | ICD-10-CM

## 2021-09-28 DIAGNOSIS — I429 Cardiomyopathy, unspecified: Secondary | ICD-10-CM | POA: Diagnosis not present

## 2021-09-28 DIAGNOSIS — Z22322 Carrier or suspected carrier of Methicillin resistant Staphylococcus aureus: Secondary | ICD-10-CM

## 2021-09-28 DIAGNOSIS — J9611 Chronic respiratory failure with hypoxia: Secondary | ICD-10-CM | POA: Diagnosis not present

## 2021-09-28 DIAGNOSIS — Z953 Presence of xenogenic heart valve: Secondary | ICD-10-CM

## 2021-09-28 DIAGNOSIS — Z515 Encounter for palliative care: Secondary | ICD-10-CM | POA: Diagnosis not present

## 2021-09-28 DIAGNOSIS — I4891 Unspecified atrial fibrillation: Secondary | ICD-10-CM | POA: Diagnosis not present

## 2021-09-28 DIAGNOSIS — R001 Bradycardia, unspecified: Secondary | ICD-10-CM | POA: Diagnosis not present

## 2021-09-28 DIAGNOSIS — Z955 Presence of coronary angioplasty implant and graft: Secondary | ICD-10-CM | POA: Diagnosis not present

## 2021-09-28 LAB — URINALYSIS, ROUTINE W REFLEX MICROSCOPIC
Bilirubin Urine: NEGATIVE
Glucose, UA: NEGATIVE mg/dL
Ketones, ur: NEGATIVE mg/dL
Leukocytes,Ua: NEGATIVE
Nitrite: NEGATIVE
Protein, ur: 30 mg/dL — AB
Specific Gravity, Urine: 1.011 (ref 1.005–1.030)
pH: 5 (ref 5.0–8.0)

## 2021-09-28 LAB — CBC WITH DIFFERENTIAL/PLATELET
Abs Immature Granulocytes: 0.02 10*3/uL (ref 0.00–0.07)
Basophils Absolute: 0 10*3/uL (ref 0.0–0.1)
Basophils Relative: 0 %
Eosinophils Absolute: 0.7 10*3/uL — ABNORMAL HIGH (ref 0.0–0.5)
Eosinophils Relative: 14 %
HCT: 26.4 % — ABNORMAL LOW (ref 39.0–52.0)
Hemoglobin: 8.7 g/dL — ABNORMAL LOW (ref 13.0–17.0)
Immature Granulocytes: 0 %
Lymphocytes Relative: 16 %
Lymphs Abs: 0.8 10*3/uL (ref 0.7–4.0)
MCH: 34.8 pg — ABNORMAL HIGH (ref 26.0–34.0)
MCHC: 33 g/dL (ref 30.0–36.0)
MCV: 105.6 fL — ABNORMAL HIGH (ref 80.0–100.0)
Monocytes Absolute: 0.4 10*3/uL (ref 0.1–1.0)
Monocytes Relative: 9 %
Neutro Abs: 2.9 10*3/uL (ref 1.7–7.7)
Neutrophils Relative %: 61 %
Platelets: 134 10*3/uL — ABNORMAL LOW (ref 150–400)
RBC: 2.5 MIL/uL — ABNORMAL LOW (ref 4.22–5.81)
RDW: 18.5 % — ABNORMAL HIGH (ref 11.5–15.5)
WBC: 4.9 10*3/uL (ref 4.0–10.5)
nRBC: 0 % (ref 0.0–0.2)

## 2021-09-28 LAB — COMPREHENSIVE METABOLIC PANEL
ALT: 20 U/L (ref 0–44)
AST: 33 U/L (ref 15–41)
Albumin: 3.2 g/dL — ABNORMAL LOW (ref 3.5–5.0)
Alkaline Phosphatase: 63 U/L (ref 38–126)
Anion gap: 12 (ref 5–15)
BUN: 133 mg/dL — ABNORMAL HIGH (ref 8–23)
CO2: 16 mmol/L — ABNORMAL LOW (ref 22–32)
Calcium: 7.9 mg/dL — ABNORMAL LOW (ref 8.9–10.3)
Chloride: 102 mmol/L (ref 98–111)
Creatinine, Ser: 4.69 mg/dL — ABNORMAL HIGH (ref 0.61–1.24)
GFR, Estimated: 11 mL/min — ABNORMAL LOW (ref >60)
Glucose, Bld: 102 mg/dL — ABNORMAL HIGH (ref 70–99)
Potassium: 4.3 mmol/L (ref 3.5–5.1)
Sodium: 130 mmol/L — ABNORMAL LOW (ref 135–145)
Total Bilirubin: 1 mg/dL (ref 0.3–1.2)
Total Protein: 6.6 g/dL (ref 6.5–8.1)

## 2021-09-28 LAB — RESP PANEL BY RT-PCR (FLU A&B, COVID) ARPGX2
Influenza A by PCR: NEGATIVE
Influenza B by PCR: NEGATIVE
SARS Coronavirus 2 by RT PCR: NEGATIVE

## 2021-09-28 LAB — TROPONIN I (HIGH SENSITIVITY)
Troponin I (High Sensitivity): 40 ng/L — ABNORMAL HIGH (ref <18)
Troponin I (High Sensitivity): 51 ng/L — ABNORMAL HIGH (ref <18)

## 2021-09-28 LAB — LACTIC ACID, PLASMA
Lactic Acid, Venous: 1.8 mmol/L (ref 0.5–1.9)
Lactic Acid, Venous: 1.5 mmol/L (ref 0.5–1.9)

## 2021-09-28 MED ORDER — POLYETHYLENE GLYCOL 3350 17 G PO PACK
17.0000 g | PACK | Freq: Every day | ORAL | Status: DC | PRN
  Filled 2021-09-28: qty 1

## 2021-09-28 MED ORDER — DOCUSATE SODIUM 100 MG PO CAPS: 100.0000 mg | ORAL_CAPSULE | Freq: Two times a day (BID) | ORAL | Status: DC | PRN

## 2021-09-28 MED ORDER — NOREPINEPHRINE 4 MG/250ML-% IV SOLN
2.0000 ug/min | INTRAVENOUS | Status: DC
  Administered 2021-09-28: 5 ug/min via INTRAVENOUS
  Administered 2021-09-29 (×2): 9 ug/min via INTRAVENOUS
  Administered 2021-09-29 – 2021-09-30 (×2): 7 ug/min via INTRAVENOUS
  Administered 2021-09-30: 6 ug/min via INTRAVENOUS
  Administered 2021-10-01: 5 ug/min via INTRAVENOUS
  Filled 2021-09-28 (×7): qty 250

## 2021-09-28 MED ORDER — HEPARIN SODIUM (PORCINE) 5000 UNIT/ML IJ SOLN
5000.0000 [IU] | Freq: Three times a day (TID) | INTRAMUSCULAR | Status: DC
  Administered 2021-09-29 – 2021-10-16 (×53): 5000 [IU] via SUBCUTANEOUS
  Filled 2021-09-28 (×53): qty 1

## 2021-09-28 MED ORDER — SODIUM CHLORIDE 0.9 % IV SOLN
250.0000 mL | INTRAVENOUS | Status: DC
  Administered 2021-09-28: 250 mL via INTRAVENOUS

## 2021-09-28 MED ORDER — CALCIUM GLUCONATE 10 % IV SOLN
1.0000 g | Freq: Once | INTRAVENOUS | Status: AC
  Administered 2021-09-28: 1 g via INTRAVENOUS
  Filled 2021-09-28: qty 10

## 2021-09-28 NOTE — ED Notes (Signed)
PT ON [facility] AT THIS TIME

## 2021-09-28 NOTE — ED Triage Notes (Signed)
from home [facility] HR 20's BP 80/60 initial pt recieved 2mg  versed pt being paced at 70 and 110 mu PT FULL CODE PER FAMILY at scene 1568ml fluid on board levo was going with ems at 57mcg pt A&Ox2 GCS 9

## 2021-09-28 NOTE — ED Provider Notes (Signed)
[facility] Provider Note    Event Date/Time   First MD Initiated Contact with Patient 09/28/21 2016     (approximate)   History   Chief Complaint Bradycardia and unresponsive   HPI  [name] is a 88 y.o. male with past medical history of hypertension, hyperlipidemia, CAD status post CABG, CHF (EF less than 20%), CKD, and MGUS who presents to the ED for unresponsivene episode.  Per EMS, patient found minimally responsive in a chair at his home with a heart rate in the 20s and BP of 80/60.  Pacing was initiated at 70 bpm and 110 mA, patient given 2 mg of IV Versed.  Patient reportedly full code per family on scene, EMS had also started Levophed drip.  On arrival, patient is somnolent but easily arousable to voice, states that he "feels fine."  He denies any pain in his chest or difficulty breathing, does not remember what happened earlier.  Family states that patient was seen by his pulmonologist earlier today, who recommended he come to the ED due to worsening fluid overload and concern for cardiorenal syndrome.  They had taken him to get something to eat and brought him home afterwards, at which point they called EMS.     Physical Exam   Triage Vital Signs: ED Triage Vitals  Enc Vitals Group     BP 09/28/21 2009 (!) 104/53     Pulse Rate 09/28/21 2009 70     Resp 09/28/21 2009 20     Temp --      Temp src --      SpO2 09/28/21 2009 (!) 82 %     Weight 09/28/21 2010 154 lb 5.2 oz (70 kg)     Height 09/28/21 2010 6' (1.829 m)     Head Circumference --      Peak Flow --      Pain Score 09/28/21 2010 0     Pain Loc --      Pain Edu? --      Excl. in [place]? --     Most recent vital signs: Vitals:   09/28/21 2250 09/28/21 2300  BP: (!) 81/51 (!) 80/54  Pulse: (!) 51 (!) 50  Resp: 12 (!) 21  Temp: (!) 94.5 F (34.7 C) (!) 94.7 F (34.8 C)  SpO2: 100% 98%    Constitutional: Somnolent but arousable to voice, oriented to person and place, but  not time. Eyes: Conjunctivae are normal.  Pupils equal, round, and reactive to light bilaterally. Head: Atraumatic. Nose: No congestion/rhinnorhea. Mouth/Throat: Mucous membranes are moist.  Cardiovascular: Bradycardic, regular rhythm. Grossly normal heart sounds.  1+ radial pulses bilaterally. Respiratory: Normal respiratory effort.  No retractions. Lungs CTAB. Gastrointestinal: Soft and nontender. No distention. Musculoskeletal: No lower extremity tenderness, 1+ pitting edema noted. Neurologic:  Normal speech and language. No gross focal neurologic deficits are appreciated.    ED Results / Procedures / Treatments   Labs (all labs ordered are listed, but only abnormal results are displayed) Labs Reviewed  CBC WITH DIFFERENTIAL/PLATELET - Abnormal; Notable for the following components:      Result Value   RBC 2.50 (*)    Hemoglobin 8.7 (*)    HCT 26.4 (*)    MCV 105.6 (*)    MCH 34.8 (*)    RDW 18.5 (*)    Platelets 134 (*)    Eosinophils Absolute 0.7 (*)    All other components within normal limits  URINALYSIS, ROUTINE W  REFLEX MICROSCOPIC - Abnormal; Notable for the following components:   Color, Urine YELLOW (*)    APPearance CLEAR (*)    Hgb urine dipstick MODERATE (*)    Protein, ur 30 (*)    Bacteria, UA RARE (*)    All other components within normal limits  COMPREHENSIVE METABOLIC PANEL - Abnormal; Notable for the following components:   Sodium 130 (*)    CO2 16 (*)    Glucose, Bld 102 (*)    BUN 133 (*)    Creatinine, Ser 4.69 (*)    Calcium 7.9 (*)    Albumin 3.2 (*)    GFR, Estimated 11 (*)    All other components within normal limits  TROPONIN I (HIGH SENSITIVITY) - Abnormal; Notable for the following components:   Troponin I (High Sensitivity) 40 (*)    All other components within normal limits  TROPONIN I (HIGH SENSITIVITY) - Abnormal; Notable for the following components:   Troponin I (High Sensitivity) 51 (*)    All other components within normal  limits  RESP PANEL BY RT-PCR (FLU A&B, COVID) ARPGX2  CULTURE, BLOOD (ROUTINE X 2)  CULTURE, BLOOD (ROUTINE X 2)  LACTIC ACID, PLASMA  LACTIC ACID, PLASMA     EKG  ED ECG REPORT I, Blake Divine, the attending physician, personally viewed and interpreted this ECG.   Date: 09/28/2021  EKG Time: 20:12  Rate: 54  Rhythm: Junctional Bradycardia  Axis: Normal  Intervals:nonspecific intraventricular conduction delay  ST&T Change: None  RADIOLOGY Chest x-ray reviewed by me with cardiomegaly and bibasilar infiltrates concerning for edema.  CT head reviewed by me with no hemorrhage or midline shift.  PROCEDURES:  Critical Care performed: Yes, see critical care procedure note(s)  .Critical Care Performed by: Blake Divine, MD Authorized by: Blake Divine, MD   Critical care provider statement:    Critical care time (minutes):  45   Critical care time was exclusive of:  Separately billable procedures and treating other patients and teaching time   Critical care was necessary to treat or prevent imminent or life-threatening deterioration of the following conditions:  Cardiac failure and renal failure   Critical care was time spent personally by me on the following activities:  Development of treatment plan with patient or surrogate, discussions with consultants, evaluation of patient's response to treatment, examination of patient, ordering and review of laboratory studies, ordering and review of radiographic studies, ordering and performing treatments and interventions, pulse oximetry, re-evaluation of patient's condition and review of old charts   I assumed direction of critical care for this patient from another provider in my specialty: no     Care discussed with: admitting provider     MEDICATIONS ORDERED IN ED: Medications  0.9 %  sodium chloride infusion (has no administration in time range)  norepinephrine (LEVOPHED) 4mg  in 274mL (0.016 mg/mL) premix infusion (has no  administration in time range)  calcium gluconate inj 10% (1 g) URGENT USE ONLY! (1 g Intravenous Given 09/28/21 2036)     IMPRESSION / MDM / [place] / ED COURSE  I reviewed the triage vital signs and the nursing notes.                              88 y.o. male with past medical history of hypertension, hyperlipidemia, CAD status post CABG, CHF (EF less than 20%), CKD, and MGUS who presents to the ED for decreased responsiveness and concern  for bradycardia with EMS.  Differential diagnosis includes, but is not limited to, complete heart block, intermittent heart block, electrolyte abnormality, hyperkalemia, arrhythmia, ACS, AKI.  On arrival, patient being paced by EMS at a rate of 70, is arousable to voice and complains of pain at his chest where he is being shocked.  When pacing is stopped, patient is noted to have a heart rate in the 50s consistent with a junctional bradycardia.  No ischemic changes noted however patient has a nonspecific intraventricular conduction delay that is dissimilar from prior left bundle branch block.  Given his advanced kidney disease, we will give dose of calcium for possible hyperkalemia as the cause of his bradycardia.  Patient maintaining O2 sats of 100% on 15 L, denies any difficulty breathing and we will gradually wean oxygen.  Case discussed with Dr. Corky Sox of cardiology, agrees no indication for pacing at this time and no ischemic changes on EKG.  Labs are remarkable for significant renal dysfunction with BUN of 133 and creatinine of greater than 4, no hyperkalemia noted.  His blood pressure is trending downwards and hypothermia is minimally improved on Bair hugger.  At this point, I am concerned that the patient is actively dying and I discussed my concerns with daughter at the bedside.  Daughter is extremely hesitant to transition to comfort care, states the patient would not want this and wishes to continue aggressive care.  Given worsening hypotension  and suspected cardiogenic shock, we will start patient on Levophed.  Low suspicion for sepsis as chest x-ray does not appear consistent with infection and UA shows no signs of infection.  Case discussed with ICU provider for admission.  The patient is on the cardiac monitor to evaluate for evidence of arrhythmia and/or significant heart rate changes.        FINAL CLINICAL IMPRESSION(S) / ED DIAGNOSES   Final diagnoses:  Acute on chronic systolic congestive heart failure (HCC)  Anasarca  Chronic kidney disease, unspecified CKD stage  Uremia  Junctional bradycardia     Rx / DC Orders   ED Discharge Orders     None        Note:  This document was prepared using Dragon voice recognition software and may include unintentional dictation errors.   Blake Divine, MD 09/28/21 2320

## 2021-09-29 ENCOUNTER — Inpatient Hospital Stay: Payer: PPO

## 2021-09-29 DIAGNOSIS — R570 Cardiogenic shock: Secondary | ICD-10-CM

## 2021-09-29 LAB — BLOOD GAS, VENOUS
Acid-base deficit: 9.2 mmol/L — ABNORMAL HIGH (ref 0.0–2.0)
Bicarbonate: 17 mmol/L — ABNORMAL LOW (ref 20.0–28.0)
FIO2: 0.28
O2 Saturation: 57.5 %
Patient temperature: 37
pCO2, Ven: 37 mmHg — ABNORMAL LOW (ref 44.0–60.0)
pH, Ven: 7.27 (ref 7.250–7.430)
pO2, Ven: 35 mmHg (ref 32.0–45.0)

## 2021-09-29 LAB — CBC
HCT: 26.7 % — ABNORMAL LOW (ref 39.0–52.0)
Hemoglobin: 8.9 g/dL — ABNORMAL LOW (ref 13.0–17.0)
MCH: 33.8 pg (ref 26.0–34.0)
MCHC: 33.3 g/dL (ref 30.0–36.0)
MCV: 101.5 fL — ABNORMAL HIGH (ref 80.0–100.0)
Platelets: 144 10*3/uL — ABNORMAL LOW (ref 150–400)
RBC: 2.63 MIL/uL — ABNORMAL LOW (ref 4.22–5.81)
RDW: 17.6 % — ABNORMAL HIGH (ref 11.5–15.5)
WBC: 5.1 10*3/uL (ref 4.0–10.5)
nRBC: 0 % (ref 0.0–0.2)

## 2021-09-29 LAB — BASIC METABOLIC PANEL
Anion gap: 11 (ref 5–15)
BUN: 127 mg/dL — ABNORMAL HIGH (ref 8–23)
CO2: 18 mmol/L — ABNORMAL LOW (ref 22–32)
Calcium: 8 mg/dL — ABNORMAL LOW (ref 8.9–10.3)
Chloride: 104 mmol/L (ref 98–111)
Creatinine, Ser: 4.69 mg/dL — ABNORMAL HIGH (ref 0.61–1.24)
GFR, Estimated: 11 mL/min — ABNORMAL LOW (ref >60)
Glucose, Bld: 117 mg/dL — ABNORMAL HIGH (ref 70–99)
Potassium: 4.2 mmol/L (ref 3.5–5.1)
Sodium: 133 mmol/L — ABNORMAL LOW (ref 135–145)

## 2021-09-29 LAB — POTASSIUM: Potassium: 4.3 mmol/L (ref 3.5–5.1)

## 2021-09-29 LAB — T4, FREE: Free T4: <0.25 ng/dL — ABNORMAL LOW (ref 0.61–1.12)

## 2021-09-29 LAB — PROTIME-INR
INR: 1.3 — ABNORMAL HIGH (ref 0.8–1.2)
Prothrombin Time: 15.7 seconds — ABNORMAL HIGH (ref 11.4–15.2)

## 2021-09-29 LAB — PROCALCITONIN: Procalcitonin: <0.1 ng/mL

## 2021-09-29 LAB — MAGNESIUM: Magnesium: 2.4 mg/dL (ref 1.7–2.4)

## 2021-09-29 LAB — TROPONIN I (HIGH SENSITIVITY)
Troponin I (High Sensitivity): 72 ng/L — ABNORMAL HIGH (ref <18)
Troponin I (High Sensitivity): 68 ng/L — ABNORMAL HIGH (ref <18)

## 2021-09-29 LAB — LACTIC ACID, PLASMA
Lactic Acid, Venous: 0.8 mmol/L (ref 0.5–1.9)
Lactic Acid, Venous: 0.9 mmol/L (ref 0.5–1.9)
Lactic Acid, Venous: 0.9 mmol/L (ref 0.5–1.9)
Lactic Acid, Venous: 1 mmol/L (ref 0.5–1.9)

## 2021-09-29 LAB — MRSA NEXT GEN BY PCR, NASAL: MRSA by PCR Next Gen: DETECTED — AB

## 2021-09-29 LAB — STREP PNEUMONIAE URINARY ANTIGEN: Strep Pneumo Urinary Antigen: NEGATIVE

## 2021-09-29 LAB — GLUCOSE, CAPILLARY: Glucose-Capillary: 97 mg/dL (ref 70–99)

## 2021-09-29 LAB — PHOSPHORUS: Phosphorus: 6.6 mg/dL — ABNORMAL HIGH (ref 2.5–4.6)

## 2021-09-29 LAB — CORTISOL: Cortisol, Plasma: 16.9 ug/dL

## 2021-09-29 LAB — BRAIN NATRIURETIC PEPTIDE: B Natriuretic Peptide: 2137.5 pg/mL — ABNORMAL HIGH (ref 0.0–100.0)

## 2021-09-29 LAB — TSH: TSH: 213 u[IU]/mL — ABNORMAL HIGH (ref 0.350–4.500)

## 2021-09-29 MED ORDER — HYDROCORTISONE SOD SUC (PF) 100 MG IJ SOLR
100.0000 mg | Freq: Three times a day (TID) | INTRAMUSCULAR | Status: DC
  Administered 2021-09-29 – 2021-10-02 (×10): 100 mg via INTRAVENOUS
  Filled 2021-09-29 (×10): qty 2

## 2021-09-29 MED ORDER — FUROSEMIDE 10 MG/ML IJ SOLN
6.0000 mg/h | INTRAVENOUS | Status: DC
  Administered 2021-09-29 – 2021-10-01 (×3): 6 mg/h via INTRAVENOUS
  Filled 2021-09-29 (×2): qty 20

## 2021-09-29 MED ORDER — CHLORHEXIDINE GLUCONATE CLOTH 2 % EX PADS
6.0000 | MEDICATED_PAD | Freq: Every day | CUTANEOUS | Status: DC
  Administered 2021-09-29 – 2021-10-16 (×18): 6 via TOPICAL

## 2021-09-29 MED ORDER — LEVOTHYROXINE SODIUM 50 MCG PO TABS
25.0000 ug | ORAL_TABLET | Freq: Every day | ORAL | Status: DC
  Administered 2021-09-30 – 2021-10-02 (×3): 25 ug via ORAL
  Filled 2021-09-29 (×3): qty 1

## 2021-09-29 MED ORDER — MIDODRINE HCL 5 MG PO TABS
10.0000 mg | ORAL_TABLET | Freq: Three times a day (TID) | ORAL | Status: DC
  Administered 2021-09-29 – 2021-10-16 (×53): 10 mg via ORAL
  Filled 2021-09-29 (×52): qty 2

## 2021-09-29 MED ORDER — ENSURE ENLIVE PO LIQD
237.0000 mL | Freq: Two times a day (BID) | ORAL | Status: DC
  Administered 2021-09-29 – 2021-10-02 (×6): 237 mL via ORAL

## 2021-09-29 MED ORDER — LEVOTHYROXINE SODIUM 100 MCG/5ML IV SOLN
200.0000 ug | Freq: Once | INTRAVENOUS | Status: AC
  Administered 2021-09-29: 200 ug via INTRAVENOUS
  Filled 2021-09-29: qty 10

## 2021-09-29 MED ORDER — VANCOMYCIN HCL 1750 MG/350ML IV SOLN
1750.0000 mg | Freq: Once | INTRAVENOUS | Status: AC
  Administered 2021-09-29: 1750 mg via INTRAVENOUS
  Filled 2021-09-29: qty 350

## 2021-09-29 MED ORDER — FUROSEMIDE 10 MG/ML IJ SOLN
20.0000 mg | Freq: Two times a day (BID) | INTRAMUSCULAR | Status: DC
  Administered 2021-09-29: 20 mg via INTRAVENOUS
  Filled 2021-09-29: qty 2

## 2021-09-29 MED ORDER — ADULT MULTIVITAMIN W/MINERALS CH
1.0000 | ORAL_TABLET | Freq: Every day | ORAL | Status: DC
  Administered 2021-09-29 – 2021-10-16 (×18): 1 via ORAL
  Filled 2021-09-29 (×18): qty 1

## 2021-09-29 MED ORDER — VANCOMYCIN VARIABLE DOSE PER UNSTABLE RENAL FUNCTION (PHARMACIST DOSING): Status: DC

## 2021-09-29 MED ORDER — MUPIROCIN 2 % EX OINT
TOPICAL_OINTMENT | Freq: Two times a day (BID) | CUTANEOUS | Status: DC
  Administered 2021-09-29 – 2021-10-07 (×8): 1 via NASAL
  Filled 2021-09-29 (×4): qty 22

## 2021-09-29 MED ORDER — CEFAZOLIN SODIUM-DEXTROSE 1-4 GM/50ML-% IV SOLN
1.0000 g | Freq: Two times a day (BID) | INTRAVENOUS | Status: DC
  Administered 2021-09-29: 1 g via INTRAVENOUS
  Filled 2021-09-29 (×2): qty 50

## 2021-09-29 NOTE — Progress Notes (Signed)
Initial Nutrition Assessment  DOCUMENTATION CODES:   Not applicable  INTERVENTION:   -Ensure Enlive po BID, each supplement provides 350 kcal and 20 grams of protein  -MVI with minerals daily -Liberalize diet to 2 gram sodium for increased variety of meal selections  NUTRITION DIAGNOSIS:   Increased nutrient needs related to chronic illness (CHF) as evidenced by estimated needs.  GOAL:   Patient will meet greater than or equal to 90% of their needs  MONITOR:   PO intake, Supplement acceptance, Labs, Weight trends, I & O's, Skin  REASON FOR ASSESSMENT:   Malnutrition Screening Tool    ASSESSMENT:   88 y.o male with significant PMH of  CKD stage IV, hypertension, proteinuria, hyperparathyroidism due to renal insufficiency, hyperlipidemia, aspiration pneumonia, Systolic HF EF of 74-08%, ischemic cardiomyopathy, bilateral carotid stenosis, severe aortic stenosis, AAA, MGUS, paroxysmal SVT, CABG status post LIMA to LAD, SVG to OM1, OM2, D1, PDA, status post bioprosthetic valve replacement in 2011 who presented to the ED with unresponsiveness.  Pt admitted with acute hypoxic respiratory failure secondary to heart failure, pulmonary edema, and possible pneumonia.   Reviewed I/O's: -374 ml x 24 hours  UOP: 650 ml x 24 hours  Case discussed with MD, RN, and during ICU rounds. Pt is a limited code and admitted with cardiogenic shock. He is currently on levoped and lasix drip will be initiated. Pt is awake, alert, and follows commands.   Spoke with pt at bedside, who was pleasant, but hard of hearing. History obtained by pt and granddaughter at bedside. Pt typically eats 2-3 meals per day- granddaughter states that pt is very conscious of his diet as he does not want to gain weight. PTA he consumed breakfast of a muffin and Ensure, lunch of spaghetti, or a meat, starch and vegetable, and will sometimes snack at night. Pt has a very supportive family and visits him daily as well as  provides meals.   Noted pt consumed 100% of grits and 50% of pancakes and sausage. Pt granddaughter shares that he is "not a big breakfast person".   Pt granddaughter shares that pt was very active and independent until last year, when he broke his hip. His mobility has declined significantly since then and uses a walker to get around. Pt also with restless leg syndrome, which worsens at night. He is unsteady on his feet. Pt with generalized fat and muscle depletions, likely related to advanced age and decreased mobility.   Pt and granddaughter deny any weight loss. He weighs himself daily and weight has been stable. Reviewed wt hx; wt has been stable over the past year.   Discussed importance of good meal and supplement intake to promote healing. Pt amenable to Ensure. Given advanced age and pt's history of CHF, will liberalize diet for increased variety of food selections and to promote adequate intake.   Medications reviewed and include levophed, lasix, and solucortef.   Labs reviewed: Na: 133, Mg: 6.6, CBGS: 97.   NUTRITION - FOCUSED PHYSICAL EXAM:  Flowsheet Row Most Recent Value  Orbital Region No depletion  Upper Arm Region Mild depletion  Thoracic and Lumbar Region No depletion  Buccal Region No depletion  Temple Region Mild depletion  Clavicle Bone Region Mild depletion  Clavicle and Acromion Bone Region No depletion  Scapular Bone Region No depletion  Dorsal Hand Mild depletion  Patellar Region Mild depletion  Anterior Thigh Region Mild depletion  Posterior Calf Region Mild depletion  Edema (RD Assessment) Mild  Hair Reviewed  Eyes Reviewed  Mouth Reviewed  Skin Reviewed  Nails Reviewed       Diet Order:   Diet Order             Diet Heart Room service appropriate? Yes; Fluid consistency: Thin  Diet effective now                   EDUCATION NEEDS:   Education needs have been addressed  Skin:  Skin Assessment: Skin Integrity Issues: Skin Integrity  Issues:: Stage II Stage II: bilateral buttocks  Last BM:  09/27/21  Height:   Ht Readings from Last 1 Encounters:  09/29/21 6' (1.829 m)    Weight:   Wt Readings from Last 1 Encounters:  09/29/21 77.6 kg    Ideal Body Weight:  80.9 kg  BMI:  Body mass index is 23.2 kg/m.  Estimated Nutritional Needs:   Kcal:  2150-2350  Protein:  100-115 grams  Fluid:  > 2 L    Loistine Chance, RD, LDN, [place] Registered Dietitian II Certified Diabetes Care and Education Specialist Please refer to [facility] for RD and/or RD on-call/weekend/after hours pager

## 2021-09-29 NOTE — Plan of Care (Signed)
Continuing with plan of care. 

## 2021-09-29 NOTE — Progress Notes (Signed)
[facility] Progress Note Patient Name: [name] DOB: Mar 02, 1931 MRN: [id number]   Date of Service  09/29/2021  HPI/Events of Note  Patient admitted with altered mental status, severe bradycardia with hypotension, in the context of severe cardiomyopathy with systolic heart failure (EF 15 - 20 %), advanced chronic kidney disease with hypervolemia, and marked hypothyroidism. He was briefly paced en-route to the hospital. Work up is in progress, and cardiology has been consulted.  eICU Interventions  New Patient Evaluation.        [place] Ganoe U Bryttany Tortorelli 09/29/2021, 3:08 AM

## 2021-09-29 NOTE — ED Notes (Signed)
Pt right forearm tegaderm changed and cleaned up. IV tubing changed as well.

## 2021-09-29 NOTE — Progress Notes (Signed)
PHARMACY CONSULT NOTE - FOLLOW UP  Pharmacy Consult for Electrolyte Monitoring and Replacement   Recent Labs: Potassium (mmol/L)  Date Value  09/29/2021 4.2  12/09/2014 4.7   Magnesium (mg/dL)  Date Value  09/29/2021 2.4   Calcium (mg/dL)  Date Value  09/29/2021 8.0 (L)   Calcium, Total (mg/dL)  Date Value  12/09/2014 8.5 (L)   Albumin (g/dL)  Date Value  09/28/2021 3.2 (L)   Phosphorus (mg/dL)  Date Value  09/29/2021 6.6 (H)   Sodium (mmol/L)  Date Value  09/29/2021 133 (L)  12/09/2014 126 (L)     Assessment: 88 year old male presented with bradycardia PTA, initially being paced on arrival. Labs revealed significantly elevated TSH and low T4. Patient followed by Nephrology outpatient, awaiting PD catheter placement. Cardiology also consulted, patient with HFrEF. Patient to start on Lasix infusion for volume overload. Pharmacy consult to manage electrolytes.   Goal of Therapy:  Electrolytes WNL  Plan:  --No electrolyte replacement indicated at this time --Given ongoing diuresis, will recheck potassium this evening to monitor trend. Will determine frequency of monitoring based on trend --All other electrolytes with morning labs  Tawnya Crook, PharmD, BCPS Clinical Pharmacist 09/29/2021 10:49 AM

## 2021-09-29 NOTE — Procedures (Signed)
Central Venous Catheter Insertion Procedure Note  [name]  [id number]  May 04, 1931  Date:09/29/21  Time:4:39 AM   Provider Performing:Drystan Reader A Leianne Callins   Procedure: Insertion of Non-tunneled Central Venous [phone]) with US guidance ([id number])   Indication(s) Medication administration and Difficult access  Consent Risks of the procedure as well as the alternatives and risks of each were explained to the patient and/or caregiver.  Consent for the procedure was obtained and is signed in the bedside chart  Anesthesia Topical only with 1% lidocaine   Timeout Verified patient identification, verified procedure, site/side was marked, verified correct patient position, special equipment/implants available, medications/allergies/relevant history reviewed, required imaging and test results available.  Sterile Technique Maximal sterile technique including full sterile barrier drape, hand hygiene, sterile gown, sterile gloves, mask, hair covering, sterile ultrasound probe cover (if used).  Procedure Description Area of catheter insertion was cleaned with chlorhexidine and draped in sterile fashion.  With real-time ultrasound guidance a central venous catheter was placed into the right internal jugular vein. Nonpulsatile blood flow and easy flushing noted in all ports.  The catheter was sutured in place and sterile dressing applied.  Complications/Tolerance None; patient tolerated the procedure well. Chest X-ray is ordered to verify placement for internal jugular or subclavian cannulation.   Chest x-ray is not ordered for femoral cannulation.  EBL Minimal  Specimen(s) None    Rufina Falco, DNP, CCRN, FNP-C, AGACNP-BC Acute Care Nurse Practitioner  [facility] Pager: [phone] [place] [facility]

## 2021-09-29 NOTE — H&P (Signed)
NAME:  [name], MRN:  [id number], DOB:  Sep 17, 1931, LOS: 1 ADMISSION DATE:  09/28/2021, CONSULTATION DATE:  09/29/2021 REFERRING MD: Blake Divine MD, CHIEF COMPLAINT:  unresponsiveness    HPI  88 y.o male with significant PMH of  CKD stage IV, hypertension, proteinuria, hyperparathyroidism due to renal insufficiency, hyperlipidemia, aspiration pneumonia, Systolic HF EF of 48-18%, ischemic cardiomyopathy, bilateral carotid stenosis, severe aortic stenosis, AAA, MGUS, paroxysmal SVT, CABG status post LIMA to LAD, SVG to OM1, OM2, D1, PDA, status post bioprosthetic valve replacement in 2011 who presented to the ED with unresponsiveness.  Per ED reports, patient was found minimally responsive in a chair at his home with heart rate in the 20s and BP was 80/60.  EMS initiated pacing at 70 bpm and 110 mA following administration of 2 mg of IV Versed.  He was also started on Levophed drip for hypotension and transported to the ED.  ED Course: On arrival to the ED, he was initially somnolent but arousable to voice and complaining of chest pain where he is being shocked.  Pacing was stopped and patient was noted to have a heart rate in the 50s consistent with junctional bradycardia.  He was hypothermic 94.5 F (34.7 C) with blood pressure  81/51 mm Hg and pulse rate 51 beats/min., RR 21 and oxygen saturation of 100% on 15 L. Chest Xray>Cardiomegaly with bilateral pleural effusions and bibasilar Opacities. Pertinent Labs in Findings: Na+/ K+: 130/4.3, Glucose: 102, BUN/Cr.: 133/4.69, Calcium: 7.9, WBC: 4.9, Hgb/Hct: 8.7/26.4, Plts: 134, PCT: negative <0.10, Lactic acid: 1.8, COVID PCR: Negative, Troponin: 40>51, BNP: 2137, TSH: 213. Given worsening hypotension and suspected cardiogenic shock, we will start patient on Levophed. PCCM consulted.  Past Medical History  CKD stage IV, hypertension, proteinuria, hyperparathyroidism due to renal insufficiency, hyperlipidemia, aspiration pneumonia, Systolic HF  EF of 56-31%, ischemic cardiomyopathy, bilateral carotid stenosis, severe aortic stenosis, AAA, MGUS, paroxysmal SVT, CABG status post LIMA to LAD, SVG to OM1, OM2, D1, PDA, status post bioprosthetic valve replacement in 2011  [place] [facility] Events   1/13:   Consults:  Nephrology Cardiology  Procedures:  1/13> Right IJ Central line  Significant Diagnostic Tests:  1/12: Chest Xray>Cardiomegaly with bilateral pleural effusions and bibasilar opacities, mildly increased from prior radiograph. Chronic interstitial coarsening. 1/12: Noncontrast CT head>No acute intracranial abnormality  Micro Data:  1/12: SARS-CoV-2 PCR> negative 1/12: Influenza PCR> negative 1/12: Blood culture x2> 1/12: Urine Culture> 1/12: MRSA PCR>> POSITIVE 1/12: Strep pneumo urinary antigen> 1/12: Legionella urinary antigen>  Antimicrobials:  Vancomycin 1/13> Cefazolin 1/13>  OBJECTIVE  Blood pressure (!) 94/58, pulse (!) 50, temperature (!) 96.4 F (35.8 C), resp. rate 17, height 6' (1.829 m), weight 70 kg, SpO2 99 %.       No intake or output data in the 24 hours ending 09/29/21 0022 Filed Weights   09/28/21 2010  Weight: 70 kg   Physical Examination  GENERAL: 88 year-old critically ill patient lying in the bed with no acute distress.  EYES: Pupils equal, round, reactive to light and accommodation. No scleral icterus. Extraocular muscles intact.  HEENT: Head atraumatic, normocephalic. Oropharynx and nasopharynx clear.  NECK:  Supple, no jugular venous distention. No thyroid enlargement, no tenderness.  LUNGS: Decreased breath sounds bilaterally, no wheezing, mild to moderate rales, crackles and rhonchi. No use of accessory muscles of respiration.  CARDIOVASCULAR: S1, S2 normal. No murmurs, rubs, or gallops.  ABDOMEN: Soft, nontender, nondistended. Bowel sounds present. No organomegaly or mass.  EXTREMITIES: +1 pitting edema  of lower extremities, cyanosis, or clubbing.  NEUROLOGIC: Cranial  nerves II through XII are intact.  Muscle strength 3/5 in all extremities. Sensation intact. Gait not checked.  PSYCHIATRIC: The patient is alert and oriented x 3.  SKIN: No obvious rash, lesion, or ulcer.   Labs/imaging that I havepersonally reviewed  (right click and "Reselect all SmartList Selections" daily)     Labs   CBC: Recent Labs  Lab 09/28/21 2015  WBC 4.9  NEUTROABS 2.9  HGB 8.7*  HCT 26.4*  MCV 105.6*  PLT 134*    Basic Metabolic Panel: Recent Labs  Lab 09/28/21 2147  NA 130*  K 4.3  CL 102  CO2 16*  GLUCOSE 102*  BUN 133*  CREATININE 4.69*  CALCIUM 7.9*   GFR: Estimated Creatinine Clearance: 10.8 mL/min (A) (by C-G formula based on SCr of 4.69 mg/dL (H)). Recent Labs  Lab 09/28/21 2015 09/28/21 2126  WBC 4.9  --   LATICACIDVEN 1.8 1.5    Liver Function Tests: Recent Labs  Lab 09/28/21 2147  AST 33  ALT 20  ALKPHOS 63  BILITOT 1.0  PROT 6.6  ALBUMIN 3.2*   No results for input(s): LIPASE, AMYLASE in the last 168 hours. No results for input(s): AMMONIA in the last 168 hours.  ABG    Component Value Date/Time   PHART 7.377 02/23/2010 2118   PCO2ART 39.5 02/23/2010 2118   PO2ART 124.0 (H) 02/23/2010 2118   HCO3 23.2 02/23/2010 2118   TCO2 23 02/23/2010 2217   ACIDBASEDEF 2.0 02/23/2010 2118   O2SAT 99.0 02/23/2010 2118     Coagulation Profile: No results for input(s): INR, PROTIME in the last 168 hours.  Cardiac Enzymes: No results for input(s): CKTOTAL, CKMB, CKMBINDEX, TROPONINI in the last 168 hours.  HbA1C: Hgb A1c MFr Bld  Date/Time Value Ref Range Status  02/23/2010 05:34 AM  <5.7 % Final   5.4 (NOTE)                                                                       According to the ADA Clinical Practice Recommendations for 2011, when HbA1c is used as a screening test:   >=6.5%   Diagnostic of Diabetes Mellitus           (if abnormal result  is confirmed)  5.7-6.4%   Increased risk of developing Diabetes Mellitus   References:Diagnosis and Classification of Diabetes Mellitus,Diabetes WUJW,1191,47(WGNFA 1):S62-S69 and Standards of Medical Care in         Diabetes - 2011,Diabetes OZHY,8657,84  (Suppl 1):S11-S61.    CBG: No results for input(s): GLUCAP in the last 168 hours.  Review of Systems:   UNABLE TO OBTAIN DUE TO ALTERED MENTAL STATUS  Past Medical History  He,  has a past medical history of Anemia, Aortic aneurysm ([place]), Aortic aneurysm ([place]), Arthritis, CHF (congestive heart failure) ([place]), CKD (chronic kidney disease), Coronary artery disease, Diverticulosis, GERD (gastroesophageal reflux disease), History of hiatal hernia, Hypercholesteremia, Hypertension, MGUS (monoclonal gammopathy of unknown significance), and MGUS (monoclonal gammopathy of unknown significance).   Surgical History    Past Surgical History:  Procedure Laterality Date   AORTIC VALVE REPLACEMENT     bipass     CARDIAC VALVE REPLACEMENT  pig valve   CHOLECYSTECTOMY     Open   COLON SURGERY     History of Hartman's procedure with subsequent colostomy reversal   CORONARY ANGIOPLASTY     CORONARY ARTERY BYPASS GRAFT     ESOPHAGOGASTRODUODENOSCOPY (EGD) WITH PROPOFOL N/A 02/07/2015   Procedure: ESOPHAGOGASTRODUODENOSCOPY (EGD) WITH PROPOFOL;  Surgeon: Hulen Luster, MD;  Location[facility] ENDOSCOPY;  Service: Gastroenterology;  Laterality: N/A;   EYE SURGERY     HERNIA REPAIR     INTRAMEDULLARY (IM) NAIL INTERTROCHANTERIC Right 05/12/2020   Procedure: INTRAMEDULLARY (IM) NAIL INTERTROCHANTRIC;  Surgeon: Lovell Sheehan, MD;  Location: [facility] ORS;  Service: Orthopedics;  Laterality: Right;   JOINT REPLACEMENT     left knee x4   KNEE ARTHROPLASTY Right 04/02/2016   Procedure: COMPUTER ASSISTED TOTAL KNEE ARTHROPLASTY;  Surgeon: Dereck Leep, MD;  Location: [facility] ORS;  Service: Orthopedics;  Laterality: Right;   ORIF FEMUR FRACTURE Right 08/08/2020   Procedure: OPEN REDUCTION INTERNAL FIXATION (ORIF) DISTAL FEMUR FRACTURE;   Surgeon: Lovell Sheehan, MD;  Location: [facility] ORS;  Service: Orthopedics;  Laterality: Right;     Social History   reports that he has quit smoking. He has never used smokeless tobacco. He reports that he does not drink alcohol and does not use drugs.   Family History   His family history includes Heart failure in his father and mother; Hypertension in his mother. There is no history of Bladder Cancer, Prostate cancer, or Kidney cancer.   Allergies Allergies  Allergen Reactions   Morphine Anaphylaxis   Oxycodone Other (See Comments)    Hypotension and bradycardia   Carvedilol     Dizziness and syncope    Cefuroxime Axetil Swelling   Iodinated Contrast Media Rash    Other reaction(s): Asthenia (finding), Other (qualifier value) paralyzed legs Other reaction(s): RASH    Lisinopril Cough and Other (See Comments)     Home Medications  Prior to Admission medications   Medication Sig Start Date End Date Taking? Authorizing Provider  acetaminophen (TYLENOL) 500 MG tablet Take 1,000 mg by mouth every 6 (six) hours as needed for mild pain.   Yes [provider]  albuterol (VENTOLIN HFA) 108 (90 Base) MCG/ACT inhaler Inhale 1-2 puffs into the lungs every 6 (six) hours as needed for wheezing or shortness of breath. 01/16/21 01/16/22 Yes [provider]  amiodarone (PACERONE) 200 MG tablet Take 1 tablet (200 mg total) by mouth daily. 06/01/21  Yes Jennye Boroughs, MD  aspirin EC 81 MG tablet Take 1 tablet (81 mg total) by mouth 2 (two) times daily. Swallow whole. 08/10/20  Yes Donne Hazel, MD  calcitRIOL (ROCALTROL) 0.25 MCG capsule Take 0.25 mcg by mouth daily. 07/03/21  Yes [provider]  losartan (COZAAR) 25 MG tablet Take 1 tablet (25 mg total) by mouth daily. Patient taking differently: Take 12.5 mg by mouth daily. 02/08/21  Yes Wouk, Ailene Rud, MD  pravastatin (PRAVACHOL) 20 MG tablet Take 20 mg by mouth every evening.   Yes [provider]   torsemide (DEMADEX) 20 MG tablet Take 1 tablet (20 mg total) by mouth daily. Patient taking differently: Take 20 mg by mouth daily. (May take additional 20mg  tablet if needed for excessive fluid weight) 06/01/21  Yes Jennye Boroughs, MD    Scheduled Meds:  heparin  5,000 Units Subcutaneous Q8H   Continuous Infusions:  sodium chloride 250 mL (09/28/21 2318)   norepinephrine (LEVOPHED) Adult infusion 9 mcg/min (09/29/21 0200)   PRN  Meds:.docusate sodium, polyethylene glycol  Assessment & Plan:  Acute Hypoxic Respiratory Failure secondary to Acute Decompensated HF, Pulmonary Edema and  ?Pneumonia Chest xray shows bilateral pleural effusion and opacities , right loculated pleural effusion  -Supplemental O2 as needed to maintain O2 saturations 88 to 92% -Intermittent chest x-ray & ABG PRN -Ensure adequate pulmonary hygiene  -F/u cultures, trend PCT -Continue CAP coverage with vancomycin and Cefepime for now -Budesonide inhaler/nebs BID, bronchodilators PRN -Consider Thoracentesis if no improvement with current management   Acute Decompensated on Chronic Systolic HF with Cardiogenic Shock +/- Septic Shock Hx: CAD status post CABG, ICM, HLD, Atrial fibrillation with Aberrancy -EF previously <20%% on 05/22.  -? If etiology worsening aortic valve stenosis, ? Bradycardia mediated, currently in junctional rhythm required external pacing -Continue levophed to maintain MAP > 65 -Assess CVP Q 4 h -Trend troponin, lactic -IV Lasix as blood pressure and renal function permits; start Lasix 20 mg BID for now pending Nephro input -Hold BP meds in the setting of hypotension  -Repeat 2D Echocardiogram pending -Cardiology consult -Palliative care consult to assist with [place] discussion with patient and family. Not sure he'd be a candidate for any invasive management with baseline functional impairment and multiple comorbidities.  AKI on CKD Stage III Likely hemodynamically mediated with hypotension  (requiring  levophed), Cardiorenal Syndrome, and venous congestion with concomitant ACE, Diurectic and NSAID use Hyponatremia -Monitor I&O's / urinary output -Follow BMP -Ensure adequate renal perfusion -Avoid nephrotoxic agents as able -Replace electrolytes as indicated -Nephrology consult   Thyroid Disorder TSH is elevated at 213 -Check Free T4 -Will treat underlying thyroid dysfunction   Best practice:  Diet:  Oral Pain/Anxiety/Delirium protocol (if indicated): No VAP protocol (if indicated): Not indicated DVT prophylaxis: Subcutaneous Heparin GI prophylaxis: PPI Glucose control:  SSI No Central venous access:  Yes, and it is still needed Arterial line:  N/A Foley:  Yes, and it is still needed Mobility:  bed rest  PT consulted: N/A Last date of multidisciplinary goals of care discussion [1/12] Code Status:  full code Disposition: ICU   = Goals of Care = Code Status Order: FULL  Primary Emergency Contact: Ludwig,Pam Wishes to pursue full aggressive treatment and intervention options, including CPR and intubation, but goals of care will be addressed on going with family if that should become necessary.  Critical care time: 45 minutes     Rufina Falco, DNP, CCRN, FNP-C, AGACNP-BC Acute Care Nurse Practitioner  [facility] Pager: [phone] [place] [facility]  .

## 2021-09-29 NOTE — Consult Note (Signed)
CARDIOLOGY CONSULT NOTE               Patient ID: [name] MRN: [id number] DOB/AGE: 1931-05-12 88 y.o.  Admit date: 09/28/2021 Referring Physician Rufina Falco, NP Primary Physician Dr. Harrel Lemon Primary Cardiologist Dr. Nehemiah Massed Reason for Consultation heart failure, bradycardia  HPI 87 year old male with a past medical history significant for CAD s/p CABG x5 in 2011, HFrEF (LVEF <20%), ischemic cardiomyopathy, CKD stage V, paroxysmal atrial fibrillation, severe AS s/p bioprosthetic valve replacement 2011, AAA (3.2 cm in 2020), history of MGUS, who presented to [facility] ED 09/28/2021 after being found minimally responsive in a chair at home with reported vital signs of heart rate in the 20s and blood pressure 80/60 by EMS.  Cardiology was consulted for assistance with his heart failure exacerbation.  EMS initiated pacing at 70 bpm and 110 MA, given 2 mg of IV Versed and started on Levophed drip.  On arrival to the ED the patient was somnolent but easily arousable to voice and states he "felt fine."  He had apparently seen his pulmonologist earlier in the day who recommended the patient go directly to the ER due to severe dyspnea, however patient apparently went home to eat some food before he could present to the ED.  The patient states he "does not feel bad" and is examined sitting upright in ICU eating his breakfast.  He says he is just felt weak for the past 3 days and states that "he is just here because his blood pressure is low and his breathing got a little worse."  Admits to lower extremity edema and chronic shortness of breath on 2 L of oxygen at home.  Denies chest pain.  He states he feels a little bit stronger than he did yesterday.   Vital signs on arrival are significant for a temperature of 93.6 F, heart rate between 49-52.  And a blood pressure of 80/55 at the lowest.  Overnight his vitals have improved to 97.3 F, heart rate 59, blood pressure 98/64 most recently  and on 2L oxygen by nasal cannula.  Labs are significant for sodium 130, glucose 102.  Creatinine 4.69, EGFR 11 (05/2021 creatinine was 3.75, GFR 15) he is followed by Dr. Holley Raring of nephrology.  BNP elevated to 2137.  Troponin trending 40-51 with repeats pending.  H&H 8.9/26.7.  Decreased from 4 months ago 11/32.  Notably TSH elevated to 213, free T4 less than 0.25. cortisol of 16.9.  Chest x-ray significant for cardiomegaly with bilateral pleural effusions and bibasilar opacities, chronic interstitial coarsening.   Review of systems complete and found to be negative unless listed above  Past Medical History:  Diagnosis Date   Anemia    Aortic aneurysm (HCC)    Aortic aneurysm (HCC)    Arthritis    CHF (congestive heart failure) (HCC)    CKD (chronic kidney disease)    ALSO LEFT RENAL MASS   Coronary artery disease    Diverticulosis    History of perforated diverticulitis   GERD (gastroesophageal reflux disease)    History of hiatal hernia    Hypercholesteremia    Hypertension    MGUS (monoclonal gammopathy of unknown significance)    MGUS (monoclonal gammopathy of unknown significance)     Past Surgical History:  Procedure Laterality Date   AORTIC VALVE REPLACEMENT     bipass     CARDIAC VALVE REPLACEMENT     pig valve   CHOLECYSTECTOMY     Open  COLON SURGERY     History of Hartman's procedure with subsequent colostomy reversal   CORONARY ANGIOPLASTY     CORONARY ARTERY BYPASS GRAFT     ESOPHAGOGASTRODUODENOSCOPY (EGD) WITH PROPOFOL N/A 02/07/2015   Procedure: ESOPHAGOGASTRODUODENOSCOPY (EGD) WITH PROPOFOL;  Surgeon: Hulen Luster, MD;  Location: [facility] ENDOSCOPY;  Service: Gastroenterology;  Laterality: N/A;   EYE SURGERY     HERNIA REPAIR     INTRAMEDULLARY (IM) NAIL INTERTROCHANTERIC Right 05/12/2020   Procedure: INTRAMEDULLARY (IM) NAIL INTERTROCHANTRIC;  Surgeon: Lovell Sheehan, MD;  Location: [facility] ORS;  Service: Orthopedics;  Laterality: Right;   JOINT REPLACEMENT      left knee x4   KNEE ARTHROPLASTY Right 04/02/2016   Procedure: COMPUTER ASSISTED TOTAL KNEE ARTHROPLASTY;  Surgeon: Dereck Leep, MD;  Location: [facility] ORS;  Service: Orthopedics;  Laterality: Right;   ORIF FEMUR FRACTURE Right 08/08/2020   Procedure: OPEN REDUCTION INTERNAL FIXATION (ORIF) DISTAL FEMUR FRACTURE;  Surgeon: Lovell Sheehan, MD;  Location: [facility] ORS;  Service: Orthopedics;  Laterality: Right;    Medications Prior to Admission  Medication Sig Dispense Refill Last Dose   acetaminophen (TYLENOL) 500 MG tablet Take 1,000 mg by mouth every 6 (six) hours as needed for mild pain.   Unknown at PRN   albuterol (VENTOLIN HFA) 108 (90 Base) MCG/ACT inhaler Inhale 1-2 puffs into the lungs every 6 (six) hours as needed for wheezing or shortness of breath.   Unknown at PRN   amiodarone (PACERONE) 200 MG tablet Take 1 tablet (200 mg total) by mouth daily. 60 tablet 1 09/28/2021 at 0800   aspirin EC 81 MG tablet Take 1 tablet (81 mg total) by mouth 2 (two) times daily. Swallow whole. 30 tablet 0 09/28/2021 at 0800   calcitRIOL (ROCALTROL) 0.25 MCG capsule Take 0.25 mcg by mouth daily.   09/28/2021 at 0800   losartan (COZAAR) 25 MG tablet Take 1 tablet (25 mg total) by mouth daily. (Patient taking differently: Take 12.5 mg by mouth daily.) 30 tablet 1 09/28/2021 at 0800   pravastatin (PRAVACHOL) 20 MG tablet Take 20 mg by mouth every evening.   09/27/2021 at 2000   torsemide (DEMADEX) 20 MG tablet Take 1 tablet (20 mg total) by mouth daily. (Patient taking differently: Take 20 mg by mouth daily. (May take additional 40m tablet if needed for excessive fluid weight)) 60 tablet 1 09/28/2021 at 0800   Social History   Socioeconomic History   Marital status: Widowed    Spouse name: Not on file   Number of children: Not on file   Years of education: Not on file   Highest education level: Not on file  Occupational History   Not on file  Tobacco Use   Smoking status: Former   Smokeless tobacco: Never   Vaping Use   Vaping Use: Never used  Substance and Sexual Activity   Alcohol use: No   Drug use: No   Sexual activity: Not on file  Other Topics Concern   Not on file  Social History Narrative   Not on file   Social Determinants of Health   Financial Resource Strain: Not on file  Food Insecurity: Not on file  Transportation Needs: Not on file  Physical Activity: Not on file  Stress: Not on file  Social Connections: Not on file  Intimate Partner Violence: Not on file    Family History  Problem Relation Age of Onset   Hypertension Mother    Heart failure Mother  Heart failure Father    Bladder Cancer Neg Hx    Prostate cancer Neg Hx    Kidney cancer Neg Hx     Review of systems complete and found to be negative unless listed above  PHYSICAL EXAM General: Pleasant elderly and frail appearing Caucasian male, well nourished, in no acute distress.  Sitting upright in ICU bed eating breakfast HEENT:  Normocephalic and atraumatic. Neck:  No JVD.  Lungs: Normal respiratory effort on 2 L by nasal cannula.  Bibasilar crackles Heart: HRRR . Normal S1 and S2, difficult to auscultate murmurs due to external pacing pads. Radial pulses 2+ bilaterally. Abdomen: Non-distended appearing.  Msk: Normal strength and tone for age. Extremities: No clubbing, cyanosis.  1+ bilateral pedal edema and associated chronic venous stasis changes in bilateral lower extremities.  Ecchymosis on dorsum of left hand.  Neuro: Alert and oriented X 3. Psych:  Mood appropriate, affect congruent.   Labs:   Lab Results  Component Value Date   WBC 5.1 09/29/2021   HGB 8.9 (L) 09/29/2021   HCT 26.7 (L) 09/29/2021   MCV 101.5 (H) 09/29/2021   PLT 144 (L) 09/29/2021    Recent Labs  Lab 09/28/21 2147 09/29/21 0520  NA 130* 133*  K 4.3 4.2  CL 102 104  CO2 16* 18*  BUN 133* 127*  CREATININE 4.69* 4.69*  CALCIUM 7.9* 8.0*  PROT 6.6  --   BILITOT 1.0  --   ALKPHOS 63  --   ALT 20  --   AST 33   --   GLUCOSE 102* 117*   No results found for: CKTOTAL, CKMB, CKMBINDEX, TROPONINI No results found for: CHOL No results found for: HDL No results found for: LDLCALC No results found for: TRIG No results found for: CHOLHDL No results found for: LDLDIRECT    Radiology: CT Head Wo Contrast  Result Date: 09/28/2021 CLINICAL DATA:  Altered mental status.  Unresponsive. EXAM: CT HEAD WITHOUT CONTRAST TECHNIQUE: Contiguous axial images were obtained from the base of the skull through the vertex without intravenous contrast. RADIATION DOSE REDUCTION: This exam was performed according to the departmental dose-optimization program which includes automated exposure control, adjustment of the mA and/or kV according to patient size and/or use of iterative reconstruction technique. COMPARISON:  05/11/2020 FINDINGS: Brain: Stable degree of atrophy and chronic small vessel ischemia. No intracranial hemorrhage, mass effect, or midline shift. No hydrocephalus. The basilar cisterns are patent. No evidence of territorial infarct or acute ischemia. No extra-axial or intracranial fluid collection. Vascular: Atherosclerosis of skullbase vasculature without hyperdense vessel or abnormal calcification. Patchy air in the cavernous sinuses, typically related to IV catheter placement. Skull: No fracture or focal lesion. Sinuses/Orbits: No evidence of acute fracture. No paranasal sinus inflammation. Bilateral cataract resection. Mastoid air cells are clear. Other: There is air within the subcutaneous soft tissues about the facial bones and temporal as musculature. This is typically related to IV catheter placement. IMPRESSION: 1. No acute intracranial abnormality. 2. Stable atrophy and chronic small vessel ischemia. 3. Patchy air in the cavernous sinuses and periorbital soft tissues, typically related to IV catheter placement. Electronically Signed   By: Keith Rake M.D.   On: 09/28/2021 21:06   DG Chest Port 1  View  Result Date: 09/29/2021 CLINICAL DATA:  Encounter for central line placement EXAM: PORTABLE CHEST 1 VIEW COMPARISON:  Yesterday FINDINGS: New right IJ line with tip at the upper cavoatrial junction. Cardiomegaly. Prior CABG and aortic valve replacement. Pleural effusions and pulmonary  opacity which are unchanged. No visible pneumothorax. IMPRESSION: 1. Right IJ line without complicating feature. 2. Stable pleural effusions and pulmonary opacity. Electronically Signed   By: Jorje Guild M.D.   On: 09/29/2021 04:42   DG Chest Portable 1 View  Result Date: 09/28/2021 CLINICAL DATA:  Altered mental status. EXAM: PORTABLE CHEST 1 VIEW COMPARISON:  07/28/2021 FINDINGS: Post median sternotomy with prosthetic cardiac valve. The heart is enlarged. Bilateral pleural effusions and bibasilar opacities, mildly increased. Right pleural effusion is likely loculated tracking towards the apex. Chronic interstitial coarsening. No pneumothorax. IMPRESSION: Cardiomegaly with bilateral pleural effusions and bibasilar opacities, mildly increased from prior radiograph. Chronic interstitial coarsening. Electronically Signed   By: Keith Rake M.D.   On: 09/28/2021 20:58    ECHO 01/2021 LVEF less than 20%, mild aortic stenosis  TELEMETRY reviewed by me: Junctional rhythm rate of 56  EKG reviewed by me: Junctional rhythm rate of 52  ASSESSMENT AND PLAN:  58-year-old male with a past medical history significant for CAD s/p CABG x5 in 2011, HFrEF (LVEF <20%), ischemic cardiomyopathy, CKD stage IV, paroxysmal atrial fibrillation, severe AS s/p bioprosthetic valve replacement 2011, AAA (3.2 cm in 2020), history of MGUS, who presented to [facility] ED 09/28/2021 after being found minimally responsive in a chair at home with reported vital signs of temperature 93 F, heart rate in the 20s and blood pressure 80/60 by EMS.  Cardiology was consulted for assistance with his heart failure exacerbation.  #Acute on chronic HFrEF  (LVEF less than 20% 01/2021) / cardiogenic shock #Ischemic cardiomyopathy #Stage V CKD #Macrocytic anemia Requiring Levophed drip and midodrine to maintain his blood pressure in the low 100s currently.  Titrate drip as tolerated per primary team. Agree with Lasix drip this morning per nephrology. BNP >2000 on arrival.  He is followed by nephrology on an outpatient basis there were discussions of when to start home dialysis versus peritoneal dialysis prior to this hospitalization.   -repeat echocardiogram -Monitor H&H, currently 8.9/26.  -Home medications on hold for now for hypotension. losartan 12.5 mg once daily, torsemide 20 mg once daily with as needed additional 20 mg if he gained weight.  We will escalate GDMT as appropriate in patient's clinical course.  #Acute hypoxic respiratory failure Antibiotics and supportive care per primary team, on 2 L of oxygen at baseline.   #Significantly elevated TSH at 213, free T4 less than 0.25 ?History of thyroid disorder. Patient presented hypothermic, mildly hyponatremic, bradycardic and hypotensive.   -Would strongly recommend further endocrine work-up and supportive care to achieve normothermia  #CAD s/p CABGx5 2011 #Hyperlipidemia Continue pravastatin 20 milligrams and aspirin  #Paroxysmal atrial fibrillation Hold amiodarone. On 200 mg once daily at home, not anticoagulated due to patient preference and concerns of significant bleeding and his known anemia.  This patient's plan of care was discussed and created with Dr. Donnelly Angelica and he is in agreement.  Signed: Tristan Schroeder , PA-C 09/29/2021, 8:18 AM

## 2021-09-29 NOTE — Progress Notes (Signed)
[facility]  ROUNDING NOTE   Subjective:  Patient well-known to [name] from the office as we follow him for chronic kidney disease stage V. We were in the process of placing peritoneal dialysis catheter for the patient however he did have to undergo preoperative cardiac evaluation. He presents now with increasing shortness of breath, dyspnea with exertion, and increasing lower extremity edema. Currently on norepinephrine drip.   Objective:  Vital signs in last 24 hours:  Temp:  [93.6 F (34.2 C)-98.3 F (36.8 C)] 97 F (36.1 C) (01/13 1000) Pulse Rate:  [46-119] 119 (01/13 1000) Resp:  [9-22] 15 (01/13 1000) BP: (80-113)/(51-100) 104/68 (01/13 1000) SpO2:  [69 %-100 %] 100 % (01/13 1000) Weight:  [70 kg-77.6 kg] 77.6 kg (01/13 0238)  Weight change:  Filed Weights   09/28/21 2010 09/29/21 0238  Weight: 70 kg 77.6 kg    Intake/Output: I/O last 3 completed shifts: In: 275.6 [I.V.:222.8; IV Piggyback:52.8] Out: 650 [Urine:650]   Intake/Output this shift:  Total I/O In: 475.4 [I.V.:128.2; IV Piggyback:347.1] Out: -   Physical Exam: General: Chronically ill-appearing  Head: Normocephalic, atraumatic. Moist oral mucosal membranes  Eyes: Anicteric  Neck: Supple  Lungs:  Basilar rales, normal effort  Heart: S1S2 no rubs  Abdomen:  Soft, nontender, bowel sounds present  Extremities: 3+ lower extremity edema.  Neurologic: Awake, alert, following commands  Skin: No acute rash  Access: No dialysis access    Basic Metabolic Panel: Recent Labs  Lab 09/28/21 2147 09/29/21 0520  NA 130* 133*  K 4.3 4.2  CL 102 104  CO2 16* 18*  GLUCOSE 102* 117*  BUN 133* 127*  CREATININE 4.69* 4.69*  CALCIUM 7.9* 8.0*  MG  --  2.4  PHOS  --  6.6*    Liver Function Tests: Recent Labs  Lab 09/28/21 2147  AST 33  ALT 20  ALKPHOS 63  BILITOT 1.0  PROT 6.6  ALBUMIN 3.2*   No results for input(s): LIPASE, AMYLASE in the last 168 hours. No results for input(s):  AMMONIA in the last 168 hours.  CBC: Recent Labs  Lab 09/28/21 2015 09/29/21 0520  WBC 4.9 5.1  NEUTROABS 2.9  --   HGB 8.7* 8.9*  HCT 26.4* 26.7*  MCV 105.6* 101.5*  PLT 134* 144*    Cardiac Enzymes: No results for input(s): CKTOTAL, CKMB, CKMBINDEX, TROPONINI in the last 168 hours.  BNP: Invalid input(s): POCBNP  CBG: Recent Labs  Lab 09/29/21 0231  WIOMBT 59    Microbiology: Results for orders placed or performed during the hospital encounter of 09/28/21  Resp Panel by RT-PCR (Flu A&B, Covid) Nasopharyngeal Swab     Status: None   Collection Time: 09/28/21  8:19 PM   Specimen: Nasopharyngeal Swab; Nasopharyngeal(NP) swabs in vial transport medium  Result Value Ref Range Status   SARS Coronavirus 2 by RT PCR NEGATIVE NEGATIVE Final    Comment: (NOTE) SARS-CoV-2 target nucleic acids are NOT DETECTED.  The SARS-CoV-2 RNA is generally detectable in upper respiratory specimens during the acute phase of infection. The lowest concentration of SARS-CoV-2 viral copies this assay can detect is 138 copies/mL. A negative result does not preclude SARS-Cov-2 infection and should not be used as the sole basis for treatment or other patient management decisions. A negative result may occur with  improper specimen collection/handling, submission of specimen other than nasopharyngeal swab, presence of viral mutation(s) within the areas targeted by this assay, and inadequate number of viral copies(<138 copies/mL). A negative result must  be combined with clinical observations, patient history, and epidemiological information. The expected result is Negative.  Fact Sheet for Patients:  EntrepreneurPulse.com.au  Fact Sheet for Healthcare Providers:  IncredibleEmployment.be  This test is no t yet approved or cleared by the [place] FDA and  has been authorized for detection and/or diagnosis of SARS-CoV-2 by FDA under an Emergency Use  Authorization (EUA). This EUA will remain  in effect (meaning this test can be used) for the duration of the COVID-19 declaration under Section 564(b)(1) of the Act, 21 U.S.C.section 360bbb-3(b)(1), unless the authorization is terminated  or revoked sooner.       Influenza A by PCR NEGATIVE NEGATIVE Final   Influenza B by PCR NEGATIVE NEGATIVE Final    Comment: (NOTE) The Xpert Xpress SARS-CoV-2/FLU/RSV plus assay is intended as an aid in the diagnosis of influenza from Nasopharyngeal swab specimens and should not be used as a sole basis for treatment. Nasal washings and aspirates are unacceptable for Xpert Xpress SARS-CoV-2/FLU/RSV testing.  Fact Sheet for Patients: EntrepreneurPulse.com.au  Fact Sheet for Healthcare Providers: IncredibleEmployment.be  This test is not yet approved or cleared by the [place] FDA and has been authorized for detection and/or diagnosis of SARS-CoV-2 by FDA under an Emergency Use Authorization (EUA). This EUA will remain in effect (meaning this test can be used) for the duration of the COVID-19 declaration under Section 564(b)(1) of the Act, 21 U.S.C. section 360bbb-3(b)(1), unless the authorization is terminated or revoked.  Performed at [facility], [address] [place], [place] [address]   Culture, blood (routine x 2)     Status: None (Preliminary result)   Collection Time: 09/28/21  9:47 PM   Specimen: BLOOD  Result Value Ref Range Status   Specimen Description BLOOD LEFT FOREARM  Final   Special Requests   Final    BOTTLES DRAWN AEROBIC AND ANAEROBIC Blood Culture adequate volume   Culture   Final    NO GROWTH < 12 HOURS Performed at [facility], [address], [place], [place] [address]    Report Status PENDING  Incomplete  Culture, blood (routine x 2)     Status: None (Preliminary result)   Collection Time: 09/28/21  9:47 PM   Specimen: BLOOD  Result Value Ref Range  Status   Specimen Description BLOOD LEFT FOREARM  Final   Special Requests IN PEDIATRIC BOTTLE Blood Culture adequate volume  Final   Culture   Final    NO GROWTH < 12 HOURS Performed at [facility], [address], [place], [place] [address]    Report Status PENDING  Incomplete  MRSA Next Gen by PCR, Nasal     Status: Abnormal   Collection Time: 09/29/21  2:38 AM   Specimen: Nasal Mucosa; Nasal Swab  Result Value Ref Range Status   MRSA by PCR Next Gen DETECTED (A) NOT DETECTED Final    Comment: RESULT CALLED TO, READ BACK BY AND VERIFIED WITH: Hansel Starling @0353  on 09/29/21 SKL (NOTE) The GeneXpert MRSA Assay (FDA approved for NASAL specimens only), is one component of a comprehensive MRSA colonization surveillance program. It is not intended to diagnose MRSA infection nor to guide or monitor treatment for MRSA infections. Test performance is not FDA approved in patients less than 18 years old. Performed at [facility], [address], [place],  [address]     Coagulation Studies: Recent Labs    09/29/21 0115  LABPROT 15.7*  INR 1.3*  Urinalysis: Recent Labs    09/28/21 2019  COLORURINE YELLOW*  LABSPEC 1.011  PHURINE 5.0  GLUCOSEU NEGATIVE  HGBUR MODERATE*  BILIRUBINUR NEGATIVE  KETONESUR NEGATIVE  PROTEINUR 30*  NITRITE NEGATIVE  LEUKOCYTESUR NEGATIVE      Imaging: CT Head Wo Contrast  Result Date: 09/28/2021 CLINICAL DATA:  Altered mental status.  Unresponsive. EXAM: CT HEAD WITHOUT CONTRAST TECHNIQUE: Contiguous axial images were obtained from the base of the skull through the vertex without intravenous contrast. RADIATION DOSE REDUCTION: This exam was performed according to the departmental dose-optimization program which includes automated exposure control, adjustment of the mA and/or kV according to patient size and/or use of iterative reconstruction technique. COMPARISON:  05/11/2020 FINDINGS: Brain: Stable degree of atrophy  and chronic small vessel ischemia. No intracranial hemorrhage, mass effect, or midline shift. No hydrocephalus. The basilar cisterns are patent. No evidence of territorial infarct or acute ischemia. No extra-axial or intracranial fluid collection. Vascular: Atherosclerosis of skullbase vasculature without hyperdense vessel or abnormal calcification. Patchy air in the cavernous sinuses, typically related to IV catheter placement. Skull: No fracture or focal lesion. Sinuses/Orbits: No evidence of acute fracture. No paranasal sinus inflammation. Bilateral cataract resection. Mastoid air cells are clear. Other: There is air within the subcutaneous soft tissues about the facial bones and temporal as musculature. This is typically related to IV catheter placement. IMPRESSION: 1. No acute intracranial abnormality. 2. Stable atrophy and chronic small vessel ischemia. 3. Patchy air in the cavernous sinuses and periorbital soft tissues, typically related to IV catheter placement. Electronically Signed   By: Keith Rake M.D.   On: 09/28/2021 21:06   DG Chest Port 1 View  Result Date: 09/29/2021 CLINICAL DATA:  Encounter for central line placement EXAM: PORTABLE CHEST 1 VIEW COMPARISON:  Yesterday FINDINGS: New right IJ line with tip at the upper cavoatrial junction. Cardiomegaly. Prior CABG and aortic valve replacement. Pleural effusions and pulmonary opacity which are unchanged. No visible pneumothorax. IMPRESSION: 1. Right IJ line without complicating feature. 2. Stable pleural effusions and pulmonary opacity. Electronically Signed   By: Jorje Guild M.D.   On: 09/29/2021 04:42   DG Chest Portable 1 View  Result Date: 09/28/2021 CLINICAL DATA:  Altered mental status. EXAM: PORTABLE CHEST 1 VIEW COMPARISON:  07/28/2021 FINDINGS: Post median sternotomy with prosthetic cardiac valve. The heart is enlarged. Bilateral pleural effusions and bibasilar opacities, mildly increased. Right pleural effusion is likely  loculated tracking towards the apex. Chronic interstitial coarsening. No pneumothorax. IMPRESSION: Cardiomegaly with bilateral pleural effusions and bibasilar opacities, mildly increased from prior radiograph. Chronic interstitial coarsening. Electronically Signed   By: Keith Rake M.D.   On: 09/28/2021 20:58     Medications:    sodium chloride 250 mL (09/28/21 2318)   furosemide (LASIX) 200 mg in dextrose 5% 100 mL (2mg /mL) infusion     norepinephrine (LEVOPHED) Adult infusion 9 mcg/min (09/29/21 1011)    Chlorhexidine Gluconate Cloth  6 each Topical Q0600   furosemide  20 mg Intravenous BID   heparin  5,000 Units Subcutaneous Q8H   hydrocortisone sod succinate (SOLU-CORTEF) inj  100 mg Intravenous Q8H   [START ON 09/30/2021] levothyroxine  25 mcg Oral Q0600   levothyroxine  200 mcg Intravenous Once   midodrine  10 mg Oral TID WC   mupirocin ointment   Nasal BID   docusate sodium, polyethylene glycol  Assessment/ Plan:  88 y.o. male with past medical history of hypertension, coronary artery disease, aortic valve replacement, osteoarthritis, diverticulosis, ruptured abdominal viscus,  history of hyponatremia, history of hiatal hernia, abdominal aortic aneurysm, constipation, right hip fracture status post repair, chronic systolic heart failure ejection fraction 20% who presents with heart failure exacerbation.  1.  Chronic kidney disease stage V.  We were in the process of having the patient evaluated for PD catheter placement.  He was in need of preoperative cardiac evaluation.  Cardiology is currently seeing the patient.  At the moment he is not quite ready for surgical procedure given acute heart failure exacerbation.  We will start the patient on Lasix drip and continue norepinephrine.  If we are able to optimize his respiratory and cardiac status we may be able to consider PD catheter placement sometime next week if family continues to desire aggressive care.  2.  Acute on chronic  systolic heart failure exacerbation.  We will start the patient on Lasix drip as above.  Appreciate cardiology input as well.  3.  Hypotension.  Maintain the patient on Levophed drip.  4.  Anemia of chronic kidney disease.  Hemoglobin 8.9.  Consider use of Epogen during his hospitalization.   LOS: 1 Agapito Hanway 1/13/202310:23 AM

## 2021-09-29 NOTE — Progress Notes (Signed)
Pharmacy Antibiotic Note  [name] is a 88 y.o. male admitted on 09/28/2021 with sepsis.  Pharmacy has been consulted for Cefazolin and Vancomycin dosing.  Contacted provider regarding pt's recently documented allergy to PO Cefuroxime causing possible facial swelling (not throat/mouth).  Pt has been given IV Cefazolin in past w/o any allergic reaction.  Per NP, transition pt from cefepime consult to cefazolin consult w/ Vanc.  Plan: With CrCl currently at 11.9, start Cefazolin 1 gm q12h per indication.  Vancomycin Placed initial order of Vancomycin 1750 mg.  Pharmacy will continue to follow and will adjust and/or order additional abx orders whenever warranted.  Height: 6' (182.9 cm) Weight: 77.6 kg (171 lb 1.2 oz) IBW/kg (Calculated) : 77.6  Temp (24hrs), Avg:96.3 F (35.7 C), Min:93.6 F (34.2 C), Max:98.3 F (36.8 C)  Recent Labs  Lab 09/28/21 2015 09/28/21 2126 09/28/21 2147 09/29/21 0006  WBC 4.9  --   --   --   CREATININE  --   --  4.69*  --   LATICACIDVEN 1.8 1.5  --  1.0    Estimated Creatinine Clearance: 11.9 mL/min (A) (by C-G formula based on SCr of 4.69 mg/dL (H)).    Allergies  Allergen Reactions   Morphine Anaphylaxis   Oxycodone Other (See Comments)    Hypotension and bradycardia   Carvedilol     Dizziness and syncope    Cefuroxime Axetil Swelling   Iodinated Contrast Media Rash    Other reaction(s): Asthenia (finding), Other (qualifier value) paralyzed legs Other reaction(s): RASH    Lisinopril Cough and Other (See Comments)    Antimicrobials this admission: 1/13 Cefepime >>  1/13 Vancomycin >>   Microbiology results: 1/12 BCx: Pending 1/12 MRSA PCR: Detected 1/13  Thank you for allowing pharmacy to be a part of this patients care.  Renda Rolls, PharmD, [facility] 09/29/2021 5:19 AM

## 2021-09-30 ENCOUNTER — Other Ambulatory Visit: Payer: Self-pay

## 2021-09-30 ENCOUNTER — Inpatient Hospital Stay: Payer: PPO

## 2021-09-30 DIAGNOSIS — R570 Cardiogenic shock: Secondary | ICD-10-CM | POA: Diagnosis not present

## 2021-09-30 LAB — CBC
HCT: 27.2 % — ABNORMAL LOW (ref 39.0–52.0)
Hemoglobin: 9.4 g/dL — ABNORMAL LOW (ref 13.0–17.0)
MCH: 34.6 pg — ABNORMAL HIGH (ref 26.0–34.0)
MCHC: 34.6 g/dL (ref 30.0–36.0)
MCV: 100 fL (ref 80.0–100.0)
Platelets: 141 10*3/uL — ABNORMAL LOW (ref 150–400)
RBC: 2.72 MIL/uL — ABNORMAL LOW (ref 4.22–5.81)
RDW: 17.7 % — ABNORMAL HIGH (ref 11.5–15.5)
WBC: 6.1 10*3/uL (ref 4.0–10.5)
nRBC: 0 % (ref 0.0–0.2)

## 2021-09-30 LAB — BASIC METABOLIC PANEL
Anion gap: 12 (ref 5–15)
BUN: 126 mg/dL — ABNORMAL HIGH (ref 8–23)
CO2: 18 mmol/L — ABNORMAL LOW (ref 22–32)
Calcium: 8 mg/dL — ABNORMAL LOW (ref 8.9–10.3)
Chloride: 102 mmol/L (ref 98–111)
Creatinine, Ser: 4.47 mg/dL — ABNORMAL HIGH (ref 0.61–1.24)
GFR, Estimated: 12 mL/min — ABNORMAL LOW (ref >60)
Glucose, Bld: 121 mg/dL — ABNORMAL HIGH (ref 70–99)
Potassium: 3.9 mmol/L (ref 3.5–5.1)
Sodium: 132 mmol/L — ABNORMAL LOW (ref 135–145)

## 2021-09-30 LAB — PHOSPHORUS: Phosphorus: 6.4 mg/dL — ABNORMAL HIGH (ref 2.5–4.6)

## 2021-09-30 LAB — T3, FREE: T3, Free: 0.7 pg/mL — ABNORMAL LOW (ref 2.0–4.4)

## 2021-09-30 LAB — MAGNESIUM: Magnesium: 2.4 mg/dL (ref 1.7–2.4)

## 2021-09-30 NOTE — Progress Notes (Signed)
NAME:  [name], MRN:  [id number], DOB:  1930-12-17, LOS: 2 ADMISSION DATE:  09/28/2021 88 y.o male with significant PMH of  CKD stage IV, hypertension, proteinuria, hyperparathyroidism due to renal insufficiency, hyperlipidemia, aspiration pneumonia, Systolic HF EF of 74-08%, ischemic cardiomyopathy, bilateral carotid stenosis, severe aortic stenosis, AAA, MGUS, paroxysmal SVT, CABG status post LIMA to LAD, SVG to OM1, OM2, D1, PDA, status post bioprosthetic valve replacement in 2011 who presented to the ED with unresponsiveness.   Per ED reports, patient was found minimally responsive in a chair at his home with heart rate in the 20s and BP was 80/60.  EMS initiated pacing at 70 bpm and 110 mA following administration of 2 mg of IV Versed.  He was also started on Levophed drip for hypotension and transported to the ED.   ED Course: On arrival to the ED, he was initially somnolent but arousable to voice and complaining of chest pain where he is being shocked.  Pacing was stopped and patient was noted to have a heart rate in the 50s consistent with junctional bradycardia.  He was hypothermic 94.5 F (34.7 C) with blood pressure  81/51 mm Hg and pulse rate 51 beats/min., RR 21 and oxygen saturation of 100% on 15 L. Chest Xray>Cardiomegaly with bilateral pleural effusions and bibasilar Opacities. Pertinent Labs in Findings: Na+/ K+: 130/4.3, Glucose: 102, BUN/Cr.: 133/4.69, Calcium: 7.9, WBC: 4.9, Hgb/Hct: 8.7/26.4, Plts: 134, PCT: negative <0.10, Lactic acid: 1.8, COVID PCR: Negative, Troponin: 40>51, BNP: 2137, TSH: 213. Given worsening hypotension and suspected cardiogenic shock, we will start patient on Levophed. PCCM consulted.   Past Medical History  CKD stage IV, hypertension, proteinuria, hyperparathyroidism due to renal insufficiency, hyperlipidemia, aspiration pneumonia, Systolic HF EF of 14-48%, ischemic cardiomyopathy, bilateral carotid stenosis, severe aortic stenosis, AAA, MGUS,  paroxysmal SVT, CABG status post LIMA to LAD, SVG to OM1, OM2, D1, PDA, status post bioprosthetic valve replacement in 2011   [place] [facility] Events   1/13: admitted for severe shock, severe hypothyroidism 1/14 remains on pressors   Consults:  Nephrology Cardiology   Procedures:  1/13> Right IJ Central line   Significant Diagnostic Tests:  1/12: Chest Xray>Cardiomegaly with bilateral pleural effusions and bibasilar opacities, mildly increased from prior radiograph. Chronic interstitial coarsening. 1/12: Noncontrast CT head>No acute intracranial abnormality   Micro Data:  1/12: SARS-CoV-2 PCR> negative 1/12: Influenza PCR> negative 1/12: Blood culture x2> 1/12: Urine Culture> 1/12: MRSA PCR>> POSITIVE 1/12: Strep pneumo urinary antigen> 1/12: Legionella urinary antigen>   Antimicrobials:  Vancomycin 1/13 Cefazolin 1/13   Micro Data:  COVID and FLU NEG  Antimicrobials:   Antibiotics Given (last 72 hours)     Date/Time Action Medication Dose Rate   09/29/21 0528 New Bag/Given   ceFAZolin (ANCEF) IVPB 1 g/50 mL premix 1 g 100 mL/hr   09/29/21 0622 New Bag/Given   vancomycin (VANCOREADY) IVPB 1750 mg/350 mL 1,750 mg 175 mL/hr            Interim History / Subjective:  Alert and awake Remains on pressors Severe shock    Objective   Blood pressure 104/67, pulse (!) 57, temperature (!) 97.5 F (36.4 C), temperature source Bladder, resp. rate 13, height 6' (1.829 m), weight 72.9 kg, SpO2 98 %. CVP:  [12 mmHg-32 mmHg] 21 mmHg      Intake/Output Summary (Last 24 hours) at 09/30/2021 0922 Last data filed at 09/30/2021 0800 Gross per 24 hour  Intake 1351.6 ml  Output 3215 ml  Net -1863.4  ml   Filed Weights   09/28/21 2010 09/29/21 0238 09/30/21 0500  Weight: 70 kg 77.6 kg 72.9 kg      Review of Systems:  Gen:  Denies  fever, sweats, chills weight loss  HEENT: Denies blurred vision, double vision, ear pain, eye pain, hearing loss, nose bleeds,  sore throat Cardiac:  No dizziness, chest pain or heaviness, chest tightness,edema, No JVD Resp:   No cough, -sputum production, -shortness of breath,-wheezing, -hemoptysis,  Other:  All other systems negative    Physical Examination:   General Appearance: No distress  EYES PERRLA, EOM intact.   NECK Supple, No JVD Pulmonary: normal breath sounds, No wheezing.  CardiovascularNormal S1,S2.  No m/r/g.   Abdomen: Benign, Soft, non-tender. Skin:   warm, no rashes, no ecchymosis  Extremities: normal, no cyanosis, clubbing. Neuro:without focal findings,  speech normal  PSYCHIATRIC: Mood, affect within normal limits.   ALL OTHER ROS ARE NEGATIVE   Labs/imaging that I havepersonally reviewed  (right click and "Reselect all SmartList Selections" daily)     ASSESSMENT AND PLAN SYNOPSIS  Admitted with Myxedema Coma with severe Hypothyroidism with cardiogenic shock with progressive renal failure cardiorenal syndrome  Cardiogenic shock, severe hypothyroidism SOURCE-severe sCHF and severe hypothyroidism -use vasopressors to keep MAP>65 as needed -follow ABG and LA as needed -follow up cultures -emperic ABX  stress dose steroids  CARDIAC FAILURE-acute systolic dysfunction -oxygen as needed -Lasix as tolerated -follow up cardiac enzymes as indicated   CARDIAC ICU monitoring   ACUTE KIDNEY INJURY/Renal Failure -continue Foley Catheter-assess need -Avoid nephrotoxic agents -Follow urine output, BMP -Ensure adequate renal perfusion, optimize oxygenation -Renal dose medications   Intake/Output Summary (Last 24 hours) at 09/30/2021 6759 Last data filed at 09/30/2021 0800 Gross per 24 hour  Intake 1351.6 ml  Output 3215 ml  Net -1863.4 ml     ENDO - ICU hypoglycemic\Hyperglycemia protocol -check FSBS per protocol Severe Hypothyroidism Loaded Synthroid and continue maintenance dose and stress dose steroids  GI GI PROPHYLAXIS as indicated  NUTRITIONAL  STATUS DIET-->as tolerated Constipation protocol as indicated   ELECTROLYTES -follow labs as needed -replace as needed -pharmacy consultation and following     Best practice (right click and "Reselect all SmartList Selections" daily)  Diet:  NPO DVT prophylaxis: Subcutaneous Heparin GI prophylaxis: PPI Central venous access:  Yes, and it is still needed Arterial line:  N/A Foley:  Yes, and it is still needed Mobility:  bed rest  Code Status:  LimiTED Disposition: ICU  Labs   CBC: Recent Labs  Lab 09/28/21 2015 09/29/21 0520 09/30/21 0549  WBC 4.9 5.1 6.1  NEUTROABS 2.9  --   --   HGB 8.7* 8.9* 9.4*  HCT 26.4* 26.7* 27.2*  MCV 105.6* 101.5* 100.0  PLT 134* 144* 141*    Basic Metabolic Panel: Recent Labs  Lab 09/28/21 2147 09/29/21 0520 09/29/21 2017 09/30/21 0549  NA 130* 133*  --  132*  K 4.3 4.2 4.3 3.9  CL 102 104  --  102  CO2 16* 18*  --  18*  GLUCOSE 102* 117*  --  121*  BUN 133* 127*  --  126*  CREATININE 4.69* 4.69*  --  4.47*  CALCIUM 7.9* 8.0*  --  8.0*  MG  --  2.4  --  2.4  PHOS  --  6.6*  --  6.4*   GFR: Estimated Creatinine Clearance: 11.8 mL/min (A) (by C-G formula based on SCr of 4.47 mg/dL (H)). Recent Labs  Lab 09/28/21  2015 09/28/21 2126 09/29/21 0006 09/29/21 0520 09/29/21 0856 09/29/21 1105 09/30/21 0549  PROCALCITON  --   --  <0.10  --   --   --   --   WBC 4.9  --   --  5.1  --   --  6.1  LATICACIDVEN 1.8   < > 1.0 0.8 0.9 0.9  --    < > = values in this interval not displayed.    Liver Function Tests: Recent Labs  Lab 09/28/21 2147  AST 33  ALT 20  ALKPHOS 63  BILITOT 1.0  PROT 6.6  ALBUMIN 3.2*   No results for input(s): LIPASE, AMYLASE in the last 168 hours. No results for input(s): AMMONIA in the last 168 hours.  ABG    Component Value Date/Time   PHART 7.377 02/23/2010 2118   PCO2ART 39.5 02/23/2010 2118   PO2ART 124.0 (H) 02/23/2010 2118   HCO3 17.0 (L) 09/29/2021 0853   TCO2 23 02/23/2010 2217    ACIDBASEDEF 9.2 (H) 09/29/2021 0853   O2SAT 57.5 09/29/2021 0853     Coagulation Profile: Recent Labs  Lab 09/29/21 0115  INR 1.3*    Cardiac Enzymes: No results for input(s): CKTOTAL, CKMB, CKMBINDEX, TROPONINI in the last 168 hours.  HbA1C: Hgb A1c MFr Bld  Date/Time Value Ref Range Status  02/23/2010 05:34 AM  <5.7 % Final   5.4 (NOTE)                                                                       According to the ADA Clinical Practice Recommendations for 2011, when HbA1c is used as a screening test:   >=6.5%   Diagnostic of Diabetes Mellitus           (if abnormal result  is confirmed)  5.7-6.4%   Increased risk of developing Diabetes Mellitus  References:Diagnosis and Classification of Diabetes Mellitus,Diabetes OEVO,3500,93(GHWEX 1):S62-S69 and Standards of Medical Care in         Diabetes - 2011,Diabetes HBZJ,6967,89  (Suppl 1):S11-S61.    CBG: Recent Labs  Lab 09/29/21 0231  GLUCAP 97    Allergies Allergies  Allergen Reactions   Morphine Anaphylaxis   Oxycodone Other (See Comments)    Hypotension and bradycardia   Carvedilol     Dizziness and syncope    Cefuroxime Axetil Swelling   Iodinated Contrast Media Rash    Other reaction(s): Asthenia (finding), Other (qualifier value) paralyzed legs Other reaction(s): RASH    Lisinopril Cough and Other (See Comments)        Critical Care Time devoted to patient care services described in this note is 45 minutes.  Critical care was necessary to treat or prevent imminent or life-threatening deterioration.   PATIENT WITH VERY POOR PROGNOSIS I ANTICIPATE PROLONGED ICU LOS  Patient with Multiorgan failure and at high risk for cardiac arrest and death.    Corrin Parker, M.D.  [facility]  Medical Director [place] Director [facility] Cardio-Pulmonary Department

## 2021-09-30 NOTE — Progress Notes (Signed)
[place] for Electrolyte Monitoring and Replacement   Recent Labs: Potassium (mmol/L)  Date Value  09/30/2021 3.9  12/09/2014 4.7   Magnesium (mg/dL)  Date Value  09/30/2021 2.4   Calcium (mg/dL)  Date Value  09/30/2021 8.0 (L)   Calcium, Total (mg/dL)  Date Value  12/09/2014 8.5 (L)   Albumin (g/dL)  Date Value  09/28/2021 3.2 (L)   Phosphorus (mg/dL)  Date Value  09/30/2021 6.4 (H)   Sodium (mmol/L)  Date Value  09/30/2021 132 (L)  12/09/2014 126 (L)   Corrected Ca: 8.6 mg/dL  Assessment: 88 year old male presented with bradycardia PTA, initially being paced on arrival. Labs revealed significantly elevated TSH and low T4. Patient followed by Nephrology outpatient, awaiting PD catheter placement. Cardiology also consulted, patient with HFrEF. Patient to start on Lasix infusion for volume overload. Pharmacy consult to manage electrolytes.   Goal of Therapy:  Electrolytes WNL  Plan:  No electrolyte replacement indicated at this time Recheck electrolytes with morning labs  Dallie Piles, PharmD, BCPS Clinical Pharmacist 09/30/2021 1:19 PM

## 2021-09-30 NOTE — Procedures (Signed)
Central Venous Catheter Placement:TRIPLE LUMEN-Replaced over guidewire   Procedure: Insertion of Non-tunneled Central Venous Catheter(36556) with US guidance (15400)   Indication(s) Medication administration and Difficult access  CVP monitoring Patient receiving vesicant or irritant drug.; Patient receiving intravenous therapy for longer than 5 days.; Patient has limited or no vascular access.    Consent Risks of the procedure as well as the alternatives and risks of each were explained to the patient and/or caregiver.  Consent for the procedure was obtained and is signed in the bedside chart  Consent:emergent   Anesthesia Topical only with 1% lidocaine    Timeout Verified patient identification, verified procedure, site/side was marked, verified correct patient position, special equipment/implants available, medications/allergies/relevant history reviewed, required imaging and test results available. Patient comfort was obtained.     Sterile Technique Maximal sterile technique including full sterile barrier drape, hand hygiene, sterile gown, sterile gloves, mask, hair covering, sterile ultrasound probe cover (if used).   Hand washing performed prior to starting the procedure.    Procedure Description Area of catheter insertion was cleaned with chlorhexidine and draped in sterile fashion.  With real-time ultrasound guidance a central venous catheter was placed into the right internal jugular vein. Nonpulsatile blood flow and easy flushing noted in all ports.  The catheter was sutured in place and sterile dressing applied.   A triple lumen catheter was placed in RT Internal Jugular Vein There was good blood return, catheter caps were placed on lumens, catheter flushed easily, the line was secured and a sterile dressing and BIO-PATCH applied. CVL replaced over guidewire   Complications/Tolerance None; patient tolerated the procedure well. Chest X-ray is ordered to verify placement      EBL Minimal   Specimen(s) None   Number of Attempts: 1 Complications:none Estimated Blood Loss: none Chest Radiograph indicated and ordered.  Operator: Gusta Marksberry.   Corrin Parker, M.D.  [facility]  Medical Director [place] Director [facility] Cardio-Pulmonary Department

## 2021-09-30 NOTE — Consult Note (Signed)
CARDIOLOGY CONSULT NOTE               Patient ID: [name] MRN: [id number] DOB/AGE: [birth year] 88 y.o.  Admit date: 09/28/2021 Referring Physician Rufina Falco, NP Primary Physician Dr. Harrel Lemon Primary Cardiologist Dr. Nehemiah Massed Reason for Consultation heart failure, bradycardia  HPI 88 year old male with a past medical history significant for CAD s/p CABG x5 in 2011, HFrEF (LVEF <20%), ischemic cardiomyopathy, CKD stage V, paroxysmal atrial fibrillation, severe AS s/p bioprosthetic valve replacement 2011, AAA (3.2 cm in 2020), history of MGUS, who presented to [facility] ED 09/28/2021 after being found minimally responsive in a chair at home with reported vital signs of heart rate in the 20s and blood pressure 80/60 by EMS.  Cardiology was consulted for assistance with his heart failure exacerbation in setting of myxedema coma.   Interval history: - Remains on low dose NE, midodrine, Stress dose steroids. - No complaints again this morning. Denies shortness of breath or chest pain.    Review of systems complete and found to be negative unless listed above  Past Medical History:  Diagnosis Date   Anemia    Aortic aneurysm (HCC)    Aortic aneurysm (HCC)    Arthritis    CHF (congestive heart failure) (HCC)    CKD (chronic kidney disease)    ALSO LEFT RENAL MASS   Coronary artery disease    Diverticulosis    History of perforated diverticulitis   GERD (gastroesophageal reflux disease)    History of hiatal hernia    Hypercholesteremia    Hypertension    MGUS (monoclonal gammopathy of unknown significance)    MGUS (monoclonal gammopathy of unknown significance)     Past Surgical History:  Procedure Laterality Date   AORTIC VALVE REPLACEMENT     bipass     CARDIAC VALVE REPLACEMENT     pig valve   CHOLECYSTECTOMY     Open   COLON SURGERY     History of Hartman's procedure with subsequent colostomy reversal   CORONARY ANGIOPLASTY     CORONARY ARTERY BYPASS  GRAFT     ESOPHAGOGASTRODUODENOSCOPY (EGD) WITH PROPOFOL N/A 02/07/2015   Procedure: ESOPHAGOGASTRODUODENOSCOPY (EGD) WITH PROPOFOL;  Surgeon: Hulen Luster, MD;  Location: [facility] ENDOSCOPY;  Service: Gastroenterology;  Laterality: N/A;   EYE SURGERY     HERNIA REPAIR     INTRAMEDULLARY (IM) NAIL INTERTROCHANTERIC Right 05/12/2020   Procedure: INTRAMEDULLARY (IM) NAIL INTERTROCHANTRIC;  Surgeon: Lovell Sheehan, MD;  Location: [facility] ORS;  Service: Orthopedics;  Laterality: Right;   JOINT REPLACEMENT     left knee x4   KNEE ARTHROPLASTY Right 04/02/2016   Procedure: COMPUTER ASSISTED TOTAL KNEE ARTHROPLASTY;  Surgeon: Dereck Leep, MD;  Location: [facility] ORS;  Service: Orthopedics;  Laterality: Right;   ORIF FEMUR FRACTURE Right 08/08/2020   Procedure: OPEN REDUCTION INTERNAL FIXATION (ORIF) DISTAL FEMUR FRACTURE;  Surgeon: Lovell Sheehan, MD;  Location: [facility] ORS;  Service: Orthopedics;  Laterality: Right;    Medications Prior to Admission  Medication Sig Dispense Refill Last Dose   acetaminophen (TYLENOL) 500 MG tablet Take 1,000 mg by mouth every 6 (six) hours as needed for mild pain.   Unknown at PRN   albuterol (VENTOLIN HFA) 108 (90 Base) MCG/ACT inhaler Inhale 1-2 puffs into the lungs every 6 (six) hours as needed for wheezing or shortness of breath.   Unknown at PRN   amiodarone (PACERONE) 200 MG tablet Take 1 tablet (200 mg total) by mouth daily. [place]  tablet 1 09/28/2021 at 0800   aspirin EC 81 MG tablet Take 1 tablet (81 mg total) by mouth 2 (two) times daily. Swallow whole. 30 tablet 0 09/28/2021 at 0800   calcitRIOL (ROCALTROL) 0.25 MCG capsule Take 0.25 mcg by mouth daily.   09/28/2021 at 0800   losartan (COZAAR) 25 MG tablet Take 1 tablet (25 mg total) by mouth daily. (Patient taking differently: Take 12.5 mg by mouth daily.) 30 tablet 1 09/28/2021 at 0800   pravastatin (PRAVACHOL) 20 MG tablet Take 20 mg by mouth every evening.   09/27/2021 at 2000   torsemide (DEMADEX) 20 MG tablet Take 1  tablet (20 mg total) by mouth daily. (Patient taking differently: Take 20 mg by mouth daily. (May take additional 20mg  tablet if needed for excessive fluid weight)) 60 tablet 1 09/28/2021 at 0800   Social History   Socioeconomic History   Marital status: Widowed    Spouse name: Not on file   Number of children: Not on file   Years of education: Not on file   Highest education level: Not on file  Occupational History   Not on file  Tobacco Use   Smoking status: Former   Smokeless tobacco: Never  Vaping Use   Vaping Use: Never used  Substance and Sexual Activity   Alcohol use: No   Drug use: No   Sexual activity: Not on file  Other Topics Concern   Not on file  Social History Narrative   Not on file   Social Determinants of Health   Financial Resource Strain: Not on file  Food Insecurity: Not on file  Transportation Needs: Not on file  Physical Activity: Not on file  Stress: Not on file  Social Connections: Not on file  Intimate Partner Violence: Not on file    Family History  Problem Relation Age of Onset   Hypertension Mother    Heart failure Mother    Heart failure Father    Bladder Cancer Neg Hx    Prostate cancer Neg Hx    Kidney cancer Neg Hx     Review of systems complete and found to be negative unless listed above  PHYSICAL EXAM General: Pleasant elderly and frail appearing Caucasian male, well nourished, in no acute distress.  Sitting upright in ICU bed eating breakfast HEENT:  Normocephalic and atraumatic. Neck:  No JVD.  Lungs: Normal respiratory effort on 2 L by nasal cannula.  Bibasilar crackles Heart: HRRR . Normal S1 and S2, difficult to auscultate murmurs due to external pacing pads. Radial pulses 2+ bilaterally. Abdomen: Non-distended appearing.  Msk: Normal strength and tone for age. Extremities: No clubbing, cyanosis.  1+ bilateral pedal edema and associated chronic venous stasis changes in bilateral lower extremities.  Ecchymosis on dorsum of  left hand.  Neuro: Alert and oriented X 3. Psych:  Mood appropriate, affect congruent.   Labs:   Lab Results  Component Value Date   WBC 6.1 09/30/2021   HGB 9.4 (L) 09/30/2021   HCT 27.2 (L) 09/30/2021   MCV 100.0 09/30/2021   PLT 141 (L) 09/30/2021    Recent Labs  Lab 09/28/21 2147 09/29/21 0520 09/30/21 0549  NA 130*   < > 132*  K 4.3   < > 3.9  CL 102   < > 102  CO2 16*   < > 18*  BUN 133*   < > 126*  CREATININE 4.69*   < > 4.47*  CALCIUM 7.9*   < > 8.0*  PROT 6.6  --   --   BILITOT 1.0  --   --   ALKPHOS 63  --   --   ALT 20  --   --   AST 33  --   --   GLUCOSE 102*   < > 121*   < > = values in this interval not displayed.    No results found for: CKTOTAL, CKMB, CKMBINDEX, TROPONINI No results found for: CHOL No results found for: HDL No results found for: LDLCALC No results found for: TRIG No results found for: CHOLHDL No results found for: LDLDIRECT    Radiology: CT Head Wo Contrast  Result Date: 09/28/2021 CLINICAL DATA:  Altered mental status.  Unresponsive. EXAM: CT HEAD WITHOUT CONTRAST TECHNIQUE: Contiguous axial images were obtained from the base of the skull through the vertex without intravenous contrast. RADIATION DOSE REDUCTION: This exam was performed according to the departmental dose-optimization program which includes automated exposure control, adjustment of the mA and/or kV according to patient size and/or use of iterative reconstruction technique. COMPARISON:  05/11/2020 FINDINGS: Brain: Stable degree of atrophy and chronic small vessel ischemia. No intracranial hemorrhage, mass effect, or midline shift. No hydrocephalus. The basilar cisterns are patent. No evidence of territorial infarct or acute ischemia. No extra-axial or intracranial fluid collection. Vascular: Atherosclerosis of skullbase vasculature without hyperdense vessel or abnormal calcification. Patchy air in the cavernous sinuses, typically related to IV catheter placement. Skull: No  fracture or focal lesion. Sinuses/Orbits: No evidence of acute fracture. No paranasal sinus inflammation. Bilateral cataract resection. Mastoid air cells are clear. Other: There is air within the subcutaneous soft tissues about the facial bones and temporal as musculature. This is typically related to IV catheter placement. IMPRESSION: 1. No acute intracranial abnormality. 2. Stable atrophy and chronic small vessel ischemia. 3. Patchy air in the cavernous sinuses and periorbital soft tissues, typically related to IV catheter placement. Electronically Signed   By: Keith Rake M.D.   On: 09/28/2021 21:06   DG Chest Port 1 View  Result Date: 09/29/2021 CLINICAL DATA:  Encounter for central line placement EXAM: PORTABLE CHEST 1 VIEW COMPARISON:  Yesterday FINDINGS: New right IJ line with tip at the upper cavoatrial junction. Cardiomegaly. Prior CABG and aortic valve replacement. Pleural effusions and pulmonary opacity which are unchanged. No visible pneumothorax. IMPRESSION: 1. Right IJ line without complicating feature. 2. Stable pleural effusions and pulmonary opacity. Electronically Signed   By: Jorje Guild M.D.   On: 09/29/2021 04:42   DG Chest Portable 1 View  Result Date: 09/28/2021 CLINICAL DATA:  Altered mental status. EXAM: PORTABLE CHEST 1 VIEW COMPARISON:  07/28/2021 FINDINGS: Post median sternotomy with prosthetic cardiac valve. The heart is enlarged. Bilateral pleural effusions and bibasilar opacities, mildly increased. Right pleural effusion is likely loculated tracking towards the apex. Chronic interstitial coarsening. No pneumothorax. IMPRESSION: Cardiomegaly with bilateral pleural effusions and bibasilar opacities, mildly increased from prior radiograph. Chronic interstitial coarsening. Electronically Signed   By: Keith Rake M.D.   On: 09/28/2021 20:58    ECHO 01/2021 LVEF less than 20%, mild aortic stenosis  TELEMETRY reviewed by me: Junctional rhythm rate of 56  EKG  reviewed by me: Junctional rhythm rate of 52  ASSESSMENT AND PLAN:  62-year-old male with a past medical history significant for CAD s/p CABG x5 in 2011, HFrEF (LVEF <20%), ischemic cardiomyopathy, CKD stage IV, paroxysmal atrial fibrillation, severe AS s/p bioprosthetic valve replacement 2011, AAA (3.2 cm in 2020), history of MGUS, who presented  to [facility] ED 09/28/2021 after being found minimally responsive in a chair at home with reported vital signs of temperature 93 F, heart rate in the 20s and blood pressure 80/60 by EMS.  Cardiology was consulted for assistance with his heart failure exacerbation.  #Severe hypothyroidism ?History of thyroid disorder. Patient presented hypothermic, mildly hyponatremic, bradycardic and hypotensive.  TSH > 200. -Management per primary team. Likely driving shock state.   #Acute on chronic HFrEF (LVEF less than 20% 01/2021) / cardiogenic shock #Ischemic cardiomyopathy #Stage V CKD #Macrocytic anemia Requiring Levophed drip and midodrine to maintain his blood pressure in setting of severe hypothyroidism.  - Continue levophed and midodirine per primary team - Treatment of hypothyroidism is key.  - Continue lasix infusion at 6 mg /hr -repeat echocardiogram -Home medications on hold for now for hypotension. losartan 12.5 mg once daily, torsemide 20 mg once daily with as needed additional 20 mg if he gained weight.  We will escalate GDMT as appropriate in patient's clinical course.  #Acute hypoxic respiratory failure Antibiotics and supportive care per primary team, on 2 L of oxygen at baseline.   #CAD s/p CABGx5 2011 #Hyperlipidemia Continue pravastatin 20 milligrams and aspirin  #Paroxysmal atrial fibrillation Hold amiodarone. On 200 mg once daily at home, not anticoagulated due to patient preference and concerns of significant bleeding and his known anemia.    Signed: Andrez Grime , MD 09/30/2021, 12:52 PM

## 2021-09-30 NOTE — Plan of Care (Signed)
Discussed with patient and daughter plan of care for the evening, pain management and left arm with some teach back displayed.  Problem: Education: Goal: Knowledge of General Education information will improve Description: Including pain rating scale, medication(s)/side effects and non-pharmacologic comfort measures Outcome: Progressing   Problem: Health Behavior/Discharge Planning: Goal: Ability to manage health-related needs will improve Outcome: Progressing

## 2021-09-30 NOTE — Progress Notes (Signed)
[facility]  ROUNDING NOTE   Subjective:   Sitting up in bed eating breakfast this morning.  Furosemide gtt 6mg  /hr Norepinephrine gtt  UOP 3236mL  Objective:  Vital signs in last 24 hours:  Temp:  [96.6 F (35.9 C)-98.1 F (36.7 C)] 97.5 F (36.4 C) (01/14 0900) Pulse Rate:  [53-59] 58 (01/14 0900) Resp:  [12-25] 16 (01/14 0900) BP: (92-109)/(51-83) 104/63 (01/14 0900) SpO2:  [93 %-100 %] 100 % (01/14 0900) Weight:  [72.9 kg] 72.9 kg (01/14 0500)  Weight change: 2.9 kg Filed Weights   09/28/21 2010 09/29/21 0238 09/30/21 0500  Weight: 70 kg 77.6 kg 72.9 kg    Intake/Output: I/O last 3 completed shifts: In: 7741 [P.O.:120; I.V.:1018.1; IV Piggyback:400] Out: 2878 [Urine:3865]   Intake/Output this shift:  Total I/O In: 89.2 [P.O.:60; I.V.:29.2] Out: -   Physical Exam: General: Chronically ill-appearing  Head: Normocephalic, atraumatic. Moist oral mucosal membranes  Eyes: Anicteric  Neck: Supple  Lungs:  Basilar rales, normal effort, 2 L [place] O2  Heart: regular  Abdomen:  Soft, nontender, bowel sounds present  Extremities: trace lower extremity edema.  Neurologic: Awake, alert, following commands  Skin: No acute rash  Access: NONE    Basic Metabolic Panel: Recent Labs  Lab 09/28/21 2147 09/29/21 0520 09/29/21 2017 09/30/21 0549  NA 130* 133*  --  132*  K 4.3 4.2 4.3 3.9  CL 102 104  --  102  CO2 16* 18*  --  18*  GLUCOSE 102* 117*  --  121*  BUN 133* 127*  --  126*  CREATININE 4.69* 4.69*  --  4.47*  CALCIUM 7.9* 8.0*  --  8.0*  MG  --  2.4  --  2.4  PHOS  --  6.6*  --  6.4*     Liver Function Tests: Recent Labs  Lab 09/28/21 2147  AST 33  ALT 20  ALKPHOS 63  BILITOT 1.0  PROT 6.6  ALBUMIN 3.2*    No results for input(s): LIPASE, AMYLASE in the last 168 hours. No results for input(s): AMMONIA in the last 168 hours.  CBC: Recent Labs  Lab 09/28/21 2015 09/29/21 0520 09/30/21 0549  WBC 4.9 5.1 6.1  NEUTROABS 2.9   --   --   HGB 8.7* 8.9* 9.4*  HCT 26.4* 26.7* 27.2*  MCV 105.6* 101.5* 100.0  PLT 134* 144* 141*     Cardiac Enzymes: No results for input(s): CKTOTAL, CKMB, CKMBINDEX, TROPONINI in the last 168 hours.  BNP: Invalid input(s): POCBNP  CBG: Recent Labs  Lab 09/29/21 0231  [facility] 94     Microbiology: Results for orders placed or performed during the hospital encounter of 09/28/21  Resp Panel by RT-PCR (Flu A&B, Covid) Nasopharyngeal Swab     Status: None   Collection Time: 09/28/21  8:19 PM   Specimen: Nasopharyngeal Swab; Nasopharyngeal(NP) swabs in vial transport medium  Result Value Ref Range Status   SARS Coronavirus 2 by RT PCR NEGATIVE NEGATIVE Final    Comment: (NOTE) SARS-CoV-2 target nucleic acids are NOT DETECTED.  The SARS-CoV-2 RNA is generally detectable in upper respiratory specimens during the acute phase of infection. The lowest concentration of SARS-CoV-2 viral copies this assay can detect is 138 copies/mL. A negative result does not preclude SARS-Cov-2 infection and should not be used as the sole basis for treatment or other patient management decisions. A negative result may occur with  improper specimen collection/handling, submission of specimen other than nasopharyngeal swab, presence of viral  mutation(s) within the areas targeted by this assay, and inadequate number of viral copies(<138 copies/mL). A negative result must be combined with clinical observations, patient history, and epidemiological information. The expected result is Negative.  Fact Sheet for Patients:  EntrepreneurPulse.com.au  Fact Sheet for Healthcare Providers:  IncredibleEmployment.be  This test is no t yet approved or cleared by the [place] FDA and  has been authorized for detection and/or diagnosis of SARS-CoV-2 by FDA under an Emergency Use Authorization (EUA). This EUA will remain  in effect (meaning this test can be used) for  the duration of the COVID-19 declaration under Section 564(b)(1) of the Act, 21 U.S.C.section 360bbb-3(b)(1), unless the authorization is terminated  or revoked sooner.       Influenza A by PCR NEGATIVE NEGATIVE Final   Influenza B by PCR NEGATIVE NEGATIVE Final    Comment: (NOTE) The Xpert Xpress SARS-CoV-2/FLU/RSV plus assay is intended as an aid in the diagnosis of influenza from Nasopharyngeal swab specimens and should not be used as a sole basis for treatment. Nasal washings and aspirates are unacceptable for Xpert Xpress SARS-CoV-2/FLU/RSV testing.  Fact Sheet for Patients: EntrepreneurPulse.com.au  Fact Sheet for Healthcare Providers: IncredibleEmployment.be  This test is not yet approved or cleared by the [place] FDA and has been authorized for detection and/or diagnosis of SARS-CoV-2 by FDA under an Emergency Use Authorization (EUA). This EUA will remain in effect (meaning this test can be used) for the duration of the COVID-19 declaration under Section 564(b)(1) of the Act, 21 U.S.C. section 360bbb-3(b)(1), unless the authorization is terminated or revoked.  Performed at [facility], [address] [place], [place] [address]   Culture, blood (routine x 2)     Status: None (Preliminary result)   Collection Time: 09/28/21  9:47 PM   Specimen: BLOOD  Result Value Ref Range Status   Specimen Description BLOOD LEFT FOREARM  Final   Special Requests   Final    BOTTLES DRAWN AEROBIC AND ANAEROBIC Blood Culture adequate volume   Culture   Final    NO GROWTH 2 DAYS Performed at [facility], [address], [place], [place] [address]    Report Status PENDING  Incomplete  Culture, blood (routine x 2)     Status: None (Preliminary result)   Collection Time: 09/28/21  9:47 PM   Specimen: BLOOD  Result Value Ref Range Status   Specimen Description BLOOD LEFT FOREARM  Final   Special Requests IN PEDIATRIC  BOTTLE Blood Culture adequate volume  Final   Culture   Final    NO GROWTH 2 DAYS Performed at [facility], [address], [place], [place] [address]    Report Status PENDING  Incomplete  MRSA Next Gen by PCR, Nasal     Status: Abnormal   Collection Time: 09/29/21  2:38 AM   Specimen: Nasal Mucosa; Nasal Swab  Result Value Ref Range Status   MRSA by PCR Next Gen DETECTED (A) NOT DETECTED Final    Comment: RESULT CALLED TO, READ BACK BY AND VERIFIED WITH: Hansel Starling @0353  on 09/29/21 SKL (NOTE) The GeneXpert MRSA Assay (FDA approved for NASAL specimens only), is one component of a comprehensive MRSA colonization surveillance program. It is not intended to diagnose MRSA infection nor to guide or monitor treatment for MRSA infections. Test performance is not FDA approved in patients less than 18 years old. Performed at [facility], [address], [place], [place] [address]     Coagulation  Studies: Recent Labs    09/29/21 0115  LABPROT 15.7*  INR 1.3*     Urinalysis: Recent Labs    09/28/21 2019  COLORURINE YELLOW*  LABSPEC 1.011  PHURINE 5.0  GLUCOSEU NEGATIVE  HGBUR MODERATE*  BILIRUBINUR NEGATIVE  KETONESUR NEGATIVE  PROTEINUR 30*  NITRITE NEGATIVE  LEUKOCYTESUR NEGATIVE       Imaging: CT Head Wo Contrast  Result Date: 09/28/2021 CLINICAL DATA:  Altered mental status.  Unresponsive. EXAM: CT HEAD WITHOUT CONTRAST TECHNIQUE: Contiguous axial images were obtained from the base of the skull through the vertex without intravenous contrast. RADIATION DOSE REDUCTION: This exam was performed according to the departmental dose-optimization program which includes automated exposure control, adjustment of the mA and/or kV according to patient size and/or use of iterative reconstruction technique. COMPARISON:  05/11/2020 FINDINGS: Brain: Stable degree of atrophy and chronic small vessel ischemia. No intracranial hemorrhage, mass effect, or midline  shift. No hydrocephalus. The basilar cisterns are patent. No evidence of territorial infarct or acute ischemia. No extra-axial or intracranial fluid collection. Vascular: Atherosclerosis of skullbase vasculature without hyperdense vessel or abnormal calcification. Patchy air in the cavernous sinuses, typically related to IV catheter placement. Skull: No fracture or focal lesion. Sinuses/Orbits: No evidence of acute fracture. No paranasal sinus inflammation. Bilateral cataract resection. Mastoid air cells are clear. Other: There is air within the subcutaneous soft tissues about the facial bones and temporal as musculature. This is typically related to IV catheter placement. IMPRESSION: 1. No acute intracranial abnormality. 2. Stable atrophy and chronic small vessel ischemia. 3. Patchy air in the cavernous sinuses and periorbital soft tissues, typically related to IV catheter placement. Electronically Signed   By: Keith Rake M.D.   On: 09/28/2021 21:06   DG Chest Port 1 View  Result Date: 09/29/2021 CLINICAL DATA:  Encounter for central line placement EXAM: PORTABLE CHEST 1 VIEW COMPARISON:  Yesterday FINDINGS: New right IJ line with tip at the upper cavoatrial junction. Cardiomegaly. Prior CABG and aortic valve replacement. Pleural effusions and pulmonary opacity which are unchanged. No visible pneumothorax. IMPRESSION: 1. Right IJ line without complicating feature. 2. Stable pleural effusions and pulmonary opacity. Electronically Signed   By: Jorje Guild M.D.   On: 09/29/2021 04:42   DG Chest Portable 1 View  Result Date: 09/28/2021 CLINICAL DATA:  Altered mental status. EXAM: PORTABLE CHEST 1 VIEW COMPARISON:  07/28/2021 FINDINGS: Post median sternotomy with prosthetic cardiac valve. The heart is enlarged. Bilateral pleural effusions and bibasilar opacities, mildly increased. Right pleural effusion is likely loculated tracking towards the apex. Chronic interstitial coarsening. No pneumothorax.  IMPRESSION: Cardiomegaly with bilateral pleural effusions and bibasilar opacities, mildly increased from prior radiograph. Chronic interstitial coarsening. Electronically Signed   By: Keith Rake M.D.   On: 09/28/2021 20:58     Medications:    sodium chloride Stopped (09/29/21 2200)   furosemide (LASIX) 200 mg in dextrose 5% 100 mL (2mg /mL) infusion 6 mg/hr (09/30/21 0800)   norepinephrine (LEVOPHED) Adult infusion 7 mcg/min (09/30/21 0800)    Chlorhexidine Gluconate Cloth  6 each Topical Q0600   feeding supplement  237 mL Oral BID BM   heparin  5,000 Units Subcutaneous Q8H   hydrocortisone sod succinate (SOLU-CORTEF) inj  100 mg Intravenous Q8H   levothyroxine  25 mcg Oral Q0600   midodrine  10 mg Oral TID WC   multivitamin with minerals  1 tablet Oral Daily   mupirocin ointment   Nasal BID   docusate sodium, polyethylene glycol  Assessment/ Plan:  [name] is a 88 y.o. white male with hypertension, coronary artery disease, aortic valve replacement, osteoarthritis, diverticulosis, ruptured abdominal viscus, history of hyponatremia, history of hiatal hernia, abdominal aortic aneurysm, constipation, right hip fracture status post repair, chronic systolic heart failure who presents to [facility] on 09/28/2021 for Cardiogenic shock ([place]) [R57.0] Anasarca [R60.1] Uremia [N19] Junctional bradycardia [R00.1] Acute on chronic systolic congestive heart failure (HCC) [I50.23] Chronic kidney disease, unspecified CKD stage [N18.9]  1. Acute kidney injury on Chronic kidney disease stage V: baseline creatinine of 3.9 on 09/04/21. Patient with chronic kidney disease secondary to hypertension and vascular disease. Acute kidney injury secondary to acute exacerbation of chronic systolic congestive heart failure:   - holding losartan due to hypotension and acute kidney injury - Low threshold to initiate dialysis with uremia, metabolic acidosis, hyponatremia and hyperphosphatemia. - Continue  furosemide gtt. Continue to monitor volume status, urine output and renal function.   2. Hypotension: with acute exacerbation of systolic congestive heart failure: echo from 01/31/21 with Ejection Fraction of <20%. Cardiogenic shock. Holding home regimen of losartan and PO torsemide.  - Continue vasopressors - Continue furosemide gtt  3. Anemia with chronic kidney disease: hemoglobin 9.4. Discussed ESA with patient.    4. Hyponatremia: secondary to chronic kidney disease and congestive heart failure.  - Continue furosemide gtt  5. Secondary Hyperparathyroidism: PTH 110 on 08/31/21. Holding calcitriol.     LOS: 2 Dane Bloch 1/14/202310:39 AM

## 2021-10-01 DIAGNOSIS — R570 Cardiogenic shock: Secondary | ICD-10-CM | POA: Diagnosis not present

## 2021-10-01 LAB — CBC
HCT: 26.2 % — ABNORMAL LOW (ref 39.0–52.0)
Hemoglobin: 9 g/dL — ABNORMAL LOW (ref 13.0–17.0)
MCH: 34.7 pg — ABNORMAL HIGH (ref 26.0–34.0)
MCHC: 34.4 g/dL (ref 30.0–36.0)
MCV: 101.2 fL — ABNORMAL HIGH (ref 80.0–100.0)
Platelets: 143 10*3/uL — ABNORMAL LOW (ref 150–400)
RBC: 2.59 MIL/uL — ABNORMAL LOW (ref 4.22–5.81)
RDW: 17.3 % — ABNORMAL HIGH (ref 11.5–15.5)
WBC: 6.9 10*3/uL (ref 4.0–10.5)
nRBC: 0 % (ref 0.0–0.2)

## 2021-10-01 LAB — RENAL FUNCTION PANEL
Albumin: 3 g/dL — ABNORMAL LOW (ref 3.5–5.0)
Anion gap: 12 (ref 5–15)
BUN: 116 mg/dL — ABNORMAL HIGH (ref 8–23)
CO2: 18 mmol/L — ABNORMAL LOW (ref 22–32)
Calcium: 8.1 mg/dL — ABNORMAL LOW (ref 8.9–10.3)
Chloride: 102 mmol/L (ref 98–111)
Creatinine, Ser: 4.49 mg/dL — ABNORMAL HIGH (ref 0.61–1.24)
GFR, Estimated: 12 mL/min — ABNORMAL LOW (ref >60)
Glucose, Bld: 112 mg/dL — ABNORMAL HIGH (ref 70–99)
Phosphorus: 6.4 mg/dL — ABNORMAL HIGH (ref 2.5–4.6)
Potassium: 3.6 mmol/L (ref 3.5–5.1)
Sodium: 132 mmol/L — ABNORMAL LOW (ref 135–145)

## 2021-10-01 LAB — LEGIONELLA PNEUMOPHILA SEROGP 1 UR AG: L. pneumophila Serogp 1 Ur Ag: NEGATIVE

## 2021-10-01 LAB — MAGNESIUM: Magnesium: 2.3 mg/dL (ref 1.7–2.4)

## 2021-10-01 MED ORDER — FUROSEMIDE 10 MG/ML IJ SOLN
60.0000 mg | Freq: Two times a day (BID) | INTRAMUSCULAR | Status: DC
  Administered 2021-10-01 – 2021-10-02 (×2): 60 mg via INTRAVENOUS
  Filled 2021-10-01 (×2): qty 6

## 2021-10-01 MED ORDER — POTASSIUM CHLORIDE CRYS ER 20 MEQ PO TBCR
40.0000 meq | EXTENDED_RELEASE_TABLET | Freq: Once | ORAL | Status: AC
  Administered 2021-10-01: 40 meq via ORAL
  Filled 2021-10-01: qty 2

## 2021-10-01 NOTE — Progress Notes (Signed)
[place] for Electrolyte Monitoring and Replacement   Recent Labs: Potassium (mmol/L)  Date Value  10/01/2021 3.6  12/09/2014 4.7   Magnesium (mg/dL)  Date Value  10/01/2021 2.3   Calcium (mg/dL)  Date Value  10/01/2021 8.1 (L)   Calcium, Total (mg/dL)  Date Value  12/09/2014 8.5 (L)   Albumin (g/dL)  Date Value  10/01/2021 3.0 (L)   Phosphorus (mg/dL)  Date Value  10/01/2021 6.4 (H)   Sodium (mmol/L)  Date Value  10/01/2021 132 (L)  12/09/2014 126 (L)   Corrected Ca: 8.6 mg/dL  Assessment: 88 year old male presented with bradycardia PTA, initially being paced on arrival. Labs revealed significantly elevated TSH and low T4. Patient followed by Nephrology outpatient, awaiting PD catheter placement. Cardiology also consulted, patient with HFrEF. Patient was started on Lasix infusion for volume overload but currently on 60 mg IV q12h. He remains on low-dose norepinephrine. Pharmacy consult to follow and replace electrolytes.   Diuretics: furosemide 60 mg IV every 12 hours  Goal of Therapy:  Electrolytes WNL  Plan:  40 mEq oral KCl x 1 Recheck electrolytes with morning labs  Dallie Piles, PharmD, BCPS Clinical Pharmacist 10/01/2021 6:55 AM

## 2021-10-01 NOTE — Consult Note (Signed)
CARDIOLOGY CONSULT NOTE               Patient ID: [name] MRN: [id number] DOB/AGE: 1931/03/19 88 y.o.  Admit date: 09/28/2021 Referring Physician Rufina Falco, NP Primary Physician Dr. Harrel Lemon Primary Cardiologist Dr. Nehemiah Massed Reason for Consultation heart failure, bradycardia  HPI 88 year old male with a past medical history significant for CAD s/p CABG x5 in 2011, HFrEF (LVEF <20%), ischemic cardiomyopathy, CKD stage V, paroxysmal atrial fibrillation, severe AS s/p bioprosthetic valve replacement 2011, AAA (3.2 cm in 2020), history of MGUS, who presented to [facility] ED 09/28/2021 after being found minimally responsive in a chair at home with reported vital signs of heart rate in the 20s and blood pressure 80/60 by EMS.  Cardiology was consulted for assistance with his heart failure exacerbation in setting of myxedema coma.   Interval history: - Remains on low dose NE, midodrine, Stress dose steroids. - Confused yesterday afternoon.  - No complaints this morning. Denies shortness of breath, chest pain.    Review of systems complete and found to be negative unless listed above  Past Medical History:  Diagnosis Date   Anemia    Aortic aneurysm (HCC)    Aortic aneurysm (HCC)    Arthritis    CHF (congestive heart failure) (HCC)    CKD (chronic kidney disease)    ALSO LEFT RENAL MASS   Coronary artery disease    Diverticulosis    History of perforated diverticulitis   GERD (gastroesophageal reflux disease)    History of hiatal hernia    Hypercholesteremia    Hypertension    MGUS (monoclonal gammopathy of unknown significance)    MGUS (monoclonal gammopathy of unknown significance)     Past Surgical History:  Procedure Laterality Date   AORTIC VALVE REPLACEMENT     bipass     CARDIAC VALVE REPLACEMENT     pig valve   CHOLECYSTECTOMY     Open   COLON SURGERY     History of Hartman's procedure with subsequent colostomy reversal   CORONARY ANGIOPLASTY      CORONARY ARTERY BYPASS GRAFT     ESOPHAGOGASTRODUODENOSCOPY (EGD) WITH PROPOFOL N/A 02/07/2015   Procedure: ESOPHAGOGASTRODUODENOSCOPY (EGD) WITH PROPOFOL;  Surgeon: Hulen Luster, MD;  Location: [facility] ENDOSCOPY;  Service: Gastroenterology;  Laterality: N/A;   EYE SURGERY     HERNIA REPAIR     INTRAMEDULLARY (IM) NAIL INTERTROCHANTERIC Right 05/12/2020   Procedure: INTRAMEDULLARY (IM) NAIL INTERTROCHANTRIC;  Surgeon: Lovell Sheehan, MD;  Location: [facility] ORS;  Service: Orthopedics;  Laterality: Right;   JOINT REPLACEMENT     left knee x4   KNEE ARTHROPLASTY Right 04/02/2016   Procedure: COMPUTER ASSISTED TOTAL KNEE ARTHROPLASTY;  Surgeon: Dereck Leep, MD;  Location: [facility] ORS;  Service: Orthopedics;  Laterality: Right;   ORIF FEMUR FRACTURE Right 08/08/2020   Procedure: OPEN REDUCTION INTERNAL FIXATION (ORIF) DISTAL FEMUR FRACTURE;  Surgeon: Lovell Sheehan, MD;  Location: [facility] ORS;  Service: Orthopedics;  Laterality: Right;    Medications Prior to Admission  Medication Sig Dispense Refill Last Dose   acetaminophen (TYLENOL) 500 MG tablet Take 1,000 mg by mouth every 6 (six) hours as needed for mild pain.   Unknown at PRN   albuterol (VENTOLIN HFA) 108 (90 Base) MCG/ACT inhaler Inhale 1-2 puffs into the lungs every 6 (six) hours as needed for wheezing or shortness of breath.   Unknown at PRN   amiodarone (PACERONE) 200 MG tablet Take 1 tablet (200 mg total) by  mouth daily. 60 tablet 1 09/28/2021 at 0800   aspirin EC 81 MG tablet Take 1 tablet (81 mg total) by mouth 2 (two) times daily. Swallow whole. 30 tablet 0 09/28/2021 at 0800   calcitRIOL (ROCALTROL) 0.25 MCG capsule Take 0.25 mcg by mouth daily.   09/28/2021 at 0800   losartan (COZAAR) 25 MG tablet Take 1 tablet (25 mg total) by mouth daily. (Patient taking differently: Take 12.5 mg by mouth daily.) 30 tablet 1 09/28/2021 at 0800   pravastatin (PRAVACHOL) 20 MG tablet Take 20 mg by mouth every evening.   09/27/2021 at 2000   torsemide (DEMADEX)  20 MG tablet Take 1 tablet (20 mg total) by mouth daily. (Patient taking differently: Take 20 mg by mouth daily. (May take additional 20mg  tablet if needed for excessive fluid weight)) 60 tablet 1 09/28/2021 at 0800   Social History   Socioeconomic History   Marital status: Widowed    Spouse name: Not on file   Number of children: Not on file   Years of education: Not on file   Highest education level: Not on file  Occupational History   Not on file  Tobacco Use   Smoking status: Former   Smokeless tobacco: Never  Vaping Use   Vaping Use: Never used  Substance and Sexual Activity   Alcohol use: No   Drug use: No   Sexual activity: Not on file  Other Topics Concern   Not on file  Social History Narrative   Not on file   Social Determinants of Health   Financial Resource Strain: Not on file  Food Insecurity: Not on file  Transportation Needs: Not on file  Physical Activity: Not on file  Stress: Not on file  Social Connections: Not on file  Intimate Partner Violence: Not on file    Family History  Problem Relation Age of Onset   Hypertension Mother    Heart failure Mother    Heart failure Father    Bladder Cancer Neg Hx    Prostate cancer Neg Hx    Kidney cancer Neg Hx     Review of systems complete and found to be negative unless listed above  PHYSICAL EXAM General: Pleasant elderly and frail appearing Caucasian male, well nourished, in no acute distress.  Sitting upright in ICU bed eating breakfast HEENT:  Normocephalic and atraumatic. Neck:  No JVD.  Lungs: Normal respiratory effort on 2 L by nasal cannula.  Bibasilar crackles Heart: HRRR . Normal S1 and S2, difficult to auscultate murmurs due to external pacing pads. Radial pulses 2+ bilaterally. Abdomen: Non-distended appearing.  Msk: Normal strength and tone for age. Extremities: No clubbing, cyanosis.  1+ bilateral pedal edema and associated chronic venous stasis changes in bilateral lower extremities.   Ecchymosis on dorsum of left hand.  Neuro: Alert and oriented X 3. Psych:  Mood appropriate, affect congruent.   Labs:   Lab Results  Component Value Date   WBC 6.9 10/01/2021   HGB 9.0 (L) 10/01/2021   HCT 26.2 (L) 10/01/2021   MCV 101.2 (H) 10/01/2021   PLT 143 (L) 10/01/2021    Recent Labs  Lab 09/28/21 2147 09/29/21 0520 10/01/21 0411  NA 130*   < > 132*  K 4.3   < > 3.6  CL 102   < > 102  CO2 16*   < > 18*  BUN 133*   < > 116*  CREATININE 4.69*   < > 4.49*  CALCIUM 7.9*   < >  8.1*  PROT 6.6  --   --   BILITOT 1.0  --   --   ALKPHOS 63  --   --   ALT 20  --   --   AST 33  --   --   GLUCOSE 102*   < > 112*   < > = values in this interval not displayed.    No results found for: CKTOTAL, CKMB, CKMBINDEX, TROPONINI No results found for: CHOL No results found for: HDL No results found for: LDLCALC No results found for: TRIG No results found for: CHOLHDL No results found for: LDLDIRECT    Radiology: CT Head Wo Contrast  Result Date: 09/28/2021 CLINICAL DATA:  Altered mental status.  Unresponsive. EXAM: CT HEAD WITHOUT CONTRAST TECHNIQUE: Contiguous axial images were obtained from the base of the skull through the vertex without intravenous contrast. RADIATION DOSE REDUCTION: This exam was performed according to the departmental dose-optimization program which includes automated exposure control, adjustment of the mA and/or kV according to patient size and/or use of iterative reconstruction technique. COMPARISON:  05/11/2020 FINDINGS: Brain: Stable degree of atrophy and chronic small vessel ischemia. No intracranial hemorrhage, mass effect, or midline shift. No hydrocephalus. The basilar cisterns are patent. No evidence of territorial infarct or acute ischemia. No extra-axial or intracranial fluid collection. Vascular: Atherosclerosis of skullbase vasculature without hyperdense vessel or abnormal calcification. Patchy air in the cavernous sinuses, typically related to IV  catheter placement. Skull: No fracture or focal lesion. Sinuses/Orbits: No evidence of acute fracture. No paranasal sinus inflammation. Bilateral cataract resection. Mastoid air cells are clear. Other: There is air within the subcutaneous soft tissues about the facial bones and temporal as musculature. This is typically related to IV catheter placement. IMPRESSION: 1. No acute intracranial abnormality. 2. Stable atrophy and chronic small vessel ischemia. 3. Patchy air in the cavernous sinuses and periorbital soft tissues, typically related to IV catheter placement. Electronically Signed   By: Keith Rake M.D.   On: 09/28/2021 21:06   DG Chest Port 1 View  Result Date: 09/30/2021 CLINICAL DATA:  Encounter for central line placement EXAM: PORTABLE CHEST 1 VIEW COMPARISON:  Chest x-ray 09/30/2021 5:47 p.m., CT chest 05/19/2021, chest x-ray 09/29/2021 FINDINGS: Interval advancement of right internal jugular central venous catheter with tip overlying the expected region of the superior caval junction. Lines and tubes overlie the patient. Stable enlarged cardiac silhouette. The heart and mediastinal contours are unchanged. Aortic calcification prominent hilar vasculature. Patchy airspace opacities within the right lower lobe. Limited evaluation left lower lobe due to overlying cardiac paddle. Increased interstitial markings. Bilateral trace to small volume pleural effusion. No pneumothorax. No acute osseous abnormality. IMPRESSION: 1. Interval advancement of right internal jugular central venous catheter with tip overlying the expected region of the superior caval junction. 2. Pulmonary edema. 3. Bilateral trace to small volume pleural effusion. 4. Patchy right lower lobe opacities suggestive of infection/inflammation. Followup PA and lateral chest X-ray is recommended in 3-4 weeks following therapy to ensure resolution and exclude underlying malignancy. Electronically Signed   By: Iven Finn M.D.   On:  09/30/2021 19:12   DG Chest Port 1 View  Result Date: 09/30/2021 CLINICAL DATA:  Central line placement. Clinical concern for central lying being pulled back. EXAM: PORTABLE CHEST 1 VIEW COMPARISON:  09/29/2021. FINDINGS: Right internal jugular central venous line now has its tip projecting over the right apex consistent with placement in the internal jugular vein superior to its confluence with the subclavian vein.  No pneumothorax. Interstitial and hazy airspace lung opacities are noted that are without significant change from the prior exam. Small bilateral pleural effusions are stable. IMPRESSION: 1. Retracted right internal jugular central venous line as detailed. 2. No other change from the prior chest radiograph. Electronically Signed   By: Lajean Manes M.D.   On: 09/30/2021 18:05   DG Chest Port 1 View  Result Date: 09/29/2021 CLINICAL DATA:  Encounter for central line placement EXAM: PORTABLE CHEST 1 VIEW COMPARISON:  Yesterday FINDINGS: New right IJ line with tip at the upper cavoatrial junction. Cardiomegaly. Prior CABG and aortic valve replacement. Pleural effusions and pulmonary opacity which are unchanged. No visible pneumothorax. IMPRESSION: 1. Right IJ line without complicating feature. 2. Stable pleural effusions and pulmonary opacity. Electronically Signed   By: Jorje Guild M.D.   On: 09/29/2021 04:42   DG Chest Portable 1 View  Result Date: 09/28/2021 CLINICAL DATA:  Altered mental status. EXAM: PORTABLE CHEST 1 VIEW COMPARISON:  07/28/2021 FINDINGS: Post median sternotomy with prosthetic cardiac valve. The heart is enlarged. Bilateral pleural effusions and bibasilar opacities, mildly increased. Right pleural effusion is likely loculated tracking towards the apex. Chronic interstitial coarsening. No pneumothorax. IMPRESSION: Cardiomegaly with bilateral pleural effusions and bibasilar opacities, mildly increased from prior radiograph. Chronic interstitial coarsening. Electronically  Signed   By: Keith Rake M.D.   On: 09/28/2021 20:58    ECHO 01/2021 LVEF less than 20%, mild aortic stenosis  TELEMETRY reviewed by me: Junctional rhythm rate of 56  EKG reviewed by me: Junctional rhythm rate of 52  ASSESSMENT AND PLAN:  62-year-old male with a past medical history significant for CAD s/p CABG x5 in 2011, HFrEF (LVEF <20%), ischemic cardiomyopathy, CKD stage IV, paroxysmal atrial fibrillation, severe AS s/p bioprosthetic valve replacement 2011, AAA (3.2 cm in 2020), history of MGUS, who presented to [facility] ED 09/28/2021 after being found minimally responsive in a chair at home with reported vital signs of temperature 93 F, heart rate in the 20s and blood pressure 80/60 by EMS.  Cardiology was consulted for assistance with his heart failure exacerbation.  #Severe hypothyroidism ?History of thyroid disorder. Patient presented hypothermic, mildly hyponatremic, bradycardic and hypotensive.  TSH > 200. -Management per primary team. Likely driving shock state.   #Acute on chronic HFrEF (LVEF less than 20% 01/2021) / cardiogenic shock #Ischemic cardiomyopathy #Stage V CKD #Macrocytic anemia Requiring Levophed drip and midodrine to maintain his blood pressure in setting of severe hypothyroidism and known severe cardiomyopathy.  - Continue levophed and midodirine per primary team - Treatment of hypothyroidism is key.  - Continue lasix infusion at 6 mg /hr -Home medications on hold for now for hypotension. losartan 12.5 mg once daily, torsemide 20 mg once daily with as needed additional 20 mg if he gained weight.  We will escalate GDMT as appropriate in patient's clinical course.  #Acute hypoxic respiratory failure Antibiotics and supportive care per primary team, on 2 L of oxygen at baseline.   #CAD s/p CABGx5 2011 #Hyperlipidemia Continue pravastatin 20 milligrams and aspirin  #Paroxysmal atrial fibrillation Hold amiodarone. On 200 mg once daily at home, not anticoagulated  due to patient preference and concerns of significant bleeding and his known anemia.    Signed: Andrez Grime , MD 10/01/2021, 8:25 AM

## 2021-10-01 NOTE — Progress Notes (Signed)
[facility]  ROUNDING NOTE   Subjective:   Spoke to patient's daughter, [name], over the phone this morning.   Patient with no complaints.   Furosemide gtt 6mg  /hr Norepinephrine gtt  UOP 2257mL  Creatomome 4.49 (4.47)  Objective:  Vital signs in last 24 hours:  Temp:  [96.4 F (35.8 C)-97.5 F (36.4 C)] 96.8 F (36 C) (01/15 0800) Pulse Rate:  [52-77] 54 (01/15 0800) Resp:  [11-24] 17 (01/15 0800) BP: (84-117)/(45-70) 96/63 (01/15 0800) SpO2:  [91 %-100 %] 94 % (01/15 0800) Weight:  [74.4 kg-74.6 kg] 74.4 kg (01/15 0119)  Weight change: 1.7 kg Filed Weights   09/30/21 0500 09/30/21 2015 10/01/21 0119  Weight: 72.9 kg 74.6 kg 74.4 kg    Intake/Output: I/O last 3 completed shifts: In: 1675.5 [P.O.:690; I.V.:985.5] Out: 4225 [Urine:4225]   Intake/Output this shift:  Total I/O In: 25.4 [I.V.:25.4] Out: -   Physical Exam: General: Chronically ill-appearing  Head: Normocephalic, atraumatic. Moist oral mucosal membranes  Eyes: Anicteric  Neck: Supple  Lungs:  Basilar rales, normal effort, room air  Heart: regular  Abdomen:  Soft, nontender, bowel sounds present  Extremities: No lower extremity edema.  Neurologic: Awake, alert, following commands  Skin: No acute rash  Access: NONE    Basic Metabolic Panel: Recent Labs  Lab 09/28/21 2147 09/29/21 0520 09/29/21 2017 09/30/21 0549 10/01/21 0411  NA 130* 133*  --  132* 132*  K 4.3 4.2 4.3 3.9 3.6  CL 102 104  --  102 102  CO2 16* 18*  --  18* 18*  GLUCOSE 102* 117*  --  121* 112*  BUN 133* 127*  --  126* 116*  CREATININE 4.69* 4.69*  --  4.47* 4.49*  CALCIUM 7.9* 8.0*  --  8.0* 8.1*  MG  --  2.4  --  2.4 2.3  PHOS  --  6.6*  --  6.4* 6.4*     Liver Function Tests: Recent Labs  Lab 09/28/21 2147 10/01/21 0411  AST 33  --   ALT 20  --   ALKPHOS 63  --   BILITOT 1.0  --   PROT 6.6  --   ALBUMIN 3.2* 3.0*    No results for input(s): LIPASE, AMYLASE in the last 168 hours. No  results for input(s): AMMONIA in the last 168 hours.  CBC: Recent Labs  Lab 09/28/21 2015 09/29/21 0520 09/30/21 0549 10/01/21 0411  WBC 4.9 5.1 6.1 6.9  NEUTROABS 2.9  --   --   --   HGB 8.7* 8.9* 9.4* 9.0*  HCT 26.4* 26.7* 27.2* 26.2*  MCV 105.6* 101.5* 100.0 101.2*  PLT 134* 144* 141* 143*     Cardiac Enzymes: No results for input(s): CKTOTAL, CKMB, CKMBINDEX, TROPONINI in the last 168 hours.  BNP: Invalid input(s): POCBNP  CBG: Recent Labs  Lab 09/29/21 0231  KKXFGH 82     Microbiology: Results for orders placed or performed during the hospital encounter of 09/28/21  Resp Panel by RT-PCR (Flu A&B, Covid) Nasopharyngeal Swab     Status: None   Collection Time: 09/28/21  8:19 PM   Specimen: Nasopharyngeal Swab; Nasopharyngeal(NP) swabs in vial transport medium  Result Value Ref Range Status   SARS Coronavirus 2 by RT PCR NEGATIVE NEGATIVE Final    Comment: (NOTE) SARS-CoV-2 target nucleic acids are NOT DETECTED.  The SARS-CoV-2 RNA is generally detectable in upper respiratory specimens during the acute phase of infection. The lowest concentration of SARS-CoV-2 viral copies this assay  can detect is 138 copies/mL. A negative result does not preclude SARS-Cov-2 infection and should not be used as the sole basis for treatment or other patient management decisions. A negative result may occur with  improper specimen collection/handling, submission of specimen other than nasopharyngeal swab, presence of viral mutation(s) within the areas targeted by this assay, and inadequate number of viral copies(<138 copies/mL). A negative result must be combined with clinical observations, patient history, and epidemiological information. The expected result is Negative.  Fact Sheet for Patients:  EntrepreneurPulse.com.au  Fact Sheet for Healthcare Providers:  IncredibleEmployment.be  This test is no t yet approved or cleared by the  [place] FDA and  has been authorized for detection and/or diagnosis of SARS-CoV-2 by FDA under an Emergency Use Authorization (EUA). This EUA will remain  in effect (meaning this test can be used) for the duration of the COVID-19 declaration under Section 564(b)(1) of the Act, 21 U.S.C.section 360bbb-3(b)(1), unless the authorization is terminated  or revoked sooner.       Influenza A by PCR NEGATIVE NEGATIVE Final   Influenza B by PCR NEGATIVE NEGATIVE Final    Comment: (NOTE) The Xpert Xpress SARS-CoV-2/FLU/RSV plus assay is intended as an aid in the diagnosis of influenza from Nasopharyngeal swab specimens and should not be used as a sole basis for treatment. Nasal washings and aspirates are unacceptable for Xpert Xpress SARS-CoV-2/FLU/RSV testing.  Fact Sheet for Patients: EntrepreneurPulse.com.au  Fact Sheet for Healthcare Providers: IncredibleEmployment.be  This test is not yet approved or cleared by the [place] FDA and has been authorized for detection and/or diagnosis of SARS-CoV-2 by FDA under an Emergency Use Authorization (EUA). This EUA will remain in effect (meaning this test can be used) for the duration of the COVID-19 declaration under Section 564(b)(1) of the Act, 21 U.S.C. section 360bbb-3(b)(1), unless the authorization is terminated or revoked.  Performed at [facility], [address] [place], [place] [address]   Culture, blood (routine x 2)     Status: None (Preliminary result)   Collection Time: 09/28/21  9:47 PM   Specimen: BLOOD  Result Value Ref Range Status   Specimen Description BLOOD LEFT FOREARM  Final   Special Requests   Final    BOTTLES DRAWN AEROBIC AND ANAEROBIC Blood Culture adequate volume   Culture   Final    NO GROWTH 3 DAYS Performed at [facility], [address], [place], [place] [address]    Report Status PENDING  Incomplete  Culture, blood (routine x 2)      Status: None (Preliminary result)   Collection Time: 09/28/21  9:47 PM   Specimen: BLOOD  Result Value Ref Range Status   Specimen Description BLOOD LEFT FOREARM  Final   Special Requests IN PEDIATRIC BOTTLE Blood Culture adequate volume  Final   Culture   Final    NO GROWTH 3 DAYS Performed at [facility], [address], [place], [place] [address]    Report Status PENDING  Incomplete  MRSA Next Gen by PCR, Nasal     Status: Abnormal   Collection Time: 09/29/21  2:38 AM   Specimen: Nasal Mucosa; Nasal Swab  Result Value Ref Range Status   MRSA by PCR Next Gen DETECTED (A) NOT DETECTED Final    Comment: RESULT CALLED TO, READ BACK BY AND VERIFIED WITH: Hansel Starling @0353  on 09/29/21 SKL (NOTE) The GeneXpert MRSA Assay (FDA approved for NASAL specimens only), is one component of a comprehensive MRSA colonization  surveillance program. It is not intended to diagnose MRSA infection nor to guide or monitor treatment for MRSA infections. Test performance is not FDA approved in patients less than 20 years old. Performed at [facility], [address] [place], [place] [address]     Coagulation Studies: Recent Labs    09/29/21 0115  LABPROT 15.7*  INR 1.3*     Urinalysis: Recent Labs    09/28/21 2019  COLORURINE YELLOW*  LABSPEC 1.011  PHURINE 5.0  GLUCOSEU NEGATIVE  HGBUR MODERATE*  BILIRUBINUR NEGATIVE  KETONESUR NEGATIVE  PROTEINUR 30*  NITRITE NEGATIVE  LEUKOCYTESUR NEGATIVE       Imaging: DG Chest Port 1 View  Result Date: 09/30/2021 CLINICAL DATA:  Encounter for central line placement EXAM: PORTABLE CHEST 1 VIEW COMPARISON:  Chest x-ray 09/30/2021 5:47 p.m., CT chest 05/19/2021, chest x-ray 09/29/2021 FINDINGS: Interval advancement of right internal jugular central venous catheter with tip overlying the expected region of the superior caval junction. Lines and tubes overlie the patient. Stable enlarged cardiac silhouette. The heart and  mediastinal contours are unchanged. Aortic calcification prominent hilar vasculature. Patchy airspace opacities within the right lower lobe. Limited evaluation left lower lobe due to overlying cardiac paddle. Increased interstitial markings. Bilateral trace to small volume pleural effusion. No pneumothorax. No acute osseous abnormality. IMPRESSION: 1. Interval advancement of right internal jugular central venous catheter with tip overlying the expected region of the superior caval junction. 2. Pulmonary edema. 3. Bilateral trace to small volume pleural effusion. 4. Patchy right lower lobe opacities suggestive of infection/inflammation. Followup PA and lateral chest X-ray is recommended in 3-4 weeks following therapy to ensure resolution and exclude underlying malignancy. Electronically Signed   By: Iven Finn M.D.   On: 09/30/2021 19:12   DG Chest Port 1 View  Result Date: 09/30/2021 CLINICAL DATA:  Central line placement. Clinical concern for central lying being pulled back. EXAM: PORTABLE CHEST 1 VIEW COMPARISON:  09/29/2021. FINDINGS: Right internal jugular central venous line now has its tip projecting over the right apex consistent with placement in the internal jugular vein superior to its confluence with the subclavian vein. No pneumothorax. Interstitial and hazy airspace lung opacities are noted that are without significant change from the prior exam. Small bilateral pleural effusions are stable. IMPRESSION: 1. Retracted right internal jugular central venous line as detailed. 2. No other change from the prior chest radiograph. Electronically Signed   By: Lajean Manes M.D.   On: 09/30/2021 18:05     Medications:    sodium chloride Stopped (09/29/21 2200)   furosemide (LASIX) 200 mg in dextrose 5% 100 mL (2mg /mL) infusion 6 mg/hr (10/01/21 0707)   norepinephrine (LEVOPHED) Adult infusion 4 mcg/min (10/01/21 0800)    Chlorhexidine Gluconate Cloth  6 each Topical Q0600   feeding supplement   237 mL Oral BID BM   heparin  5,000 Units Subcutaneous Q8H   hydrocortisone sod succinate (SOLU-CORTEF) inj  100 mg Intravenous Q8H   levothyroxine  25 mcg Oral Q0600   midodrine  10 mg Oral TID WC   multivitamin with minerals  1 tablet Oral Daily   mupirocin ointment   Nasal BID   docusate sodium, polyethylene glycol  Assessment/ Plan:   Mr. QUINCEY QUESINBERRY is a 88 y.o. 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 Started on IV levothyroxine and now on levothyroxine 25mg  daily. Also on stress dose steroids.   1. Acute kidney injury on Chronic kidney disease stage V: baseline creatinine of 3.9 on 09/04/21. Patient with chronic kidney disease secondary to hypertension and vascular disease. Acute kidney injury secondary to acute exacerbation of chronic systolic congestive heart failure:   - holding losartan due to hypotension and acute kidney injury - Low threshold to initiate dialysis with uremia, metabolic acidosis, hyponatremia and hyperphosphatemia. - Continue furosemide - change to IV bolus.  - Continue to monitor volume status, urine output and renal function.  - Family was interested in peritoneal dialysis prior to admission.   2. Hypotension: with acute exacerbation of systolic congestive heart failure: echo from 01/31/21 with Ejection Fraction of <20%. Cardiogenic shock. Holding home regimen of losartan and PO torsemide.  - Continue  vasopressors - Change to IV furosemide bolus.   3. Anemia with chronic kidney disease: hemoglobin 9. Discussed ESA with patient.    4. Hyponatremia: secondary to chronic kidney disease and congestive heart failure.  - Continue furosemide    5. Secondary Hyperparathyroidism: PTH 110 on 08/31/21. Holding calcitriol.     LOS: 3 Jadarion Halbig 1/15/20239:17 AM

## 2021-10-01 NOTE — Progress Notes (Addendum)
NAME:  [name], MRN:  [id number], DOB:  [birth year], LOS: 3 ADMISSION DATE:  09/28/2021, INITIAL CONSULTATION DATE:  09/29/2021 REFERRING MD:  Blake Divine, MD  CHIEF COMPLAINT:  Unresponsiveness   Brief Patient Description  88 yr. male who presented to the ED with unresponsiveness. Per ED reports, patient was found in a chair at his home with heart rate in the 20s and BP was 80/60.  EMS initiated pacing at 70 bpm and 110 mA following administration of 2 mg of IV Versed.  He was also started on Levophed drip for hypotension and transported to the ED.  ED Course: On arrival to the ED, he was initially somnolent but arousable to voice and complaining of chest pain where he is being shocked.  Pacing was stopped and patient was noted to have a heart rate in the 50s consistent with junctional bradycardia.  He was hypothermic 94.5 F (34.7 C) with blood pressure 81/51 mm Hg and pulse rate 51 beats/min., RR 21 and oxygen saturation of 100% on 15 L. Chest X-ray>Cardiomegaly with bilateral pleural effusions and bibasilar Opacities. Pertinent Labs in Findings: Na+/K+:130/4.3, Glucose: 120, BUN/Cr:133/4.69, Calcium: 7.9, WBC:4.9, Hgb/Hct:8.7/26.4, PCT: Negative<0.10, Lactic acid: 1.8, COVID PCR: Negative, Troponin: 40, BNP: 2137, TSH: 213. Given worsening hypotension and suspected cardiogenic shock, patient started on Levophed. PCCM consulted.  Pertinent  Medical History  CKD stage IV, hypertension, proteinuria, hyperparathyroidism due to renal insufficiency, hyperlipidemia, aspiration pneumonia, Systolic HF EF of 93-57%, ischemic cardiomyopathy, bilateral carotid stenosis, severe aortic stenosis, AAA, MGUS, paroxysmal SVT, CABG status post LIMA to LAD, SVG to OM1, OM2, D1, PDA, status post bioprosthetic valve replacement in 2011  [facility] Events: Including procedures, antibiotic start and stop dates in addition to other pertinent events   1/13: Admitted to ICU with Acute on Chronic CHF  exacerbation in the setting of severe hypothyroidism 1/14: Started on Lasix gtt 1/15: Remains on Lasix gtt and Levo  Cultures:  1/12: SARS-CoV-2 PCR> negative 1/12: Influenza PCR> negative 1/12: Blood culture x2> 1/12: Urine Culture> 1/12: MRSA PCR>> POSITIVE 1/12: Strep pneumo urinary antigen> 1/12: Legionella urinary antigen> Antimicrobials:  Vancomycin 1/13>STOPPED Cefazolin 1/13> STOPPED  Interim History / Subjective:  -No significant events overnight, remains on Levo and Lasix gtt -Urine output adequate 2216ml/24 hour  OBJECTIVE   Blood pressure 95/65, pulse (!) 58, temperature (!) 96.8 F (36 C), resp. rate 15, height 6' (1.829 m), weight 74.4 kg, SpO2 97 %. CVP:  [14 mmHg-29 mmHg] 18 mmHg      Intake/Output Summary (Last 24 hours) at 10/01/2021 0757 Last data filed at 10/01/2021 0177 Gross per 24 hour  Intake 1166.55 ml  Output 2200 ml  Net -1033.45 ml   Filed Weights   09/30/21 0500 09/30/21 2015 10/01/21 0119  Weight: 72.9 kg 74.6 kg 74.4 kg    Physical Examination: GENERAL: 88 year-old critically ill patient lying in the bed with no acute distress.  EYES: Pupils equal, round, reactive to light and accommodation. No scleral icterus. Extraocular muscles intact.  HEENT: Head atraumatic, normocephalic. Oropharynx and nasopharynx clear.  NECK:  Supple, no jugular venous distention. No thyroid enlargement, no tenderness.  LUNGS: Decreased breath sounds bilaterally, no wheezing, mild to moderate rales, crackles and rhonchi. No use of accessory muscles of respiration.  CARDIOVASCULAR: S1, S2 normal. No murmurs, rubs, or gallops.  ABDOMEN: Soft, nontender, nondistended. Bowel sounds present. No organomegaly or mass.  EXTREMITIES: +1 pitting edema of lower extremities, cyanosis, or clubbing.  NEUROLOGIC: Cranial nerves II through XII  are intact.  Muscle strength 3/5 in all extremities. Sensation intact. Gait not checked.  PSYCHIATRIC: The patient is alert and oriented x  3.  SKIN: No obvious rash, lesion, or ulcer.   Labs/imaging that I havepersonally reviewed  (right click and "Reselect all SmartList Selections" daily)    Labs   CBC: Recent Labs  Lab 09/28/21 2015 09/29/21 0520 09/30/21 0549 10/01/21 0411  WBC 4.9 5.1 6.1 6.9  NEUTROABS 2.9  --   --   --   HGB 8.7* 8.9* 9.4* 9.0*  HCT 26.4* 26.7* 27.2* 26.2*  MCV 105.6* 101.5* 100.0 101.2*  PLT 134* 144* 141* 143*    Basic Metabolic Panel: Recent Labs  Lab 09/28/21 2147 09/29/21 0520 09/29/21 2017 09/30/21 0549 10/01/21 0411  NA 130* 133*  --  132* 132*  K 4.3 4.2 4.3 3.9 3.6  CL 102 104  --  102 102  CO2 16* 18*  --  18* 18*  GLUCOSE 102* 117*  --  121* 112*  BUN 133* 127*  --  126* 116*  CREATININE 4.69* 4.69*  --  4.47* 4.49*  CALCIUM 7.9* 8.0*  --  8.0* 8.1*  MG  --  2.4  --  2.4 2.3  PHOS  --  6.6*  --  6.4* 6.4*   GFR: Estimated Creatinine Clearance: 12 mL/min (A) (by C-G formula based on SCr of 4.49 mg/dL (H)). Recent Labs  Lab 09/28/21 2015 09/28/21 2126 09/29/21 0006 09/29/21 0520 09/29/21 0856 09/29/21 1105 09/30/21 0549 10/01/21 0411  PROCALCITON  --   --  <0.10  --   --   --   --   --   WBC 4.9  --   --  5.1  --   --  6.1 6.9  LATICACIDVEN 1.8   < > 1.0 0.8 0.9 0.9  --   --    < > = values in this interval not displayed.    Liver Function Tests: Recent Labs  Lab 09/28/21 2147 10/01/21 0411  AST 33  --   ALT 20  --   ALKPHOS 63  --   BILITOT 1.0  --   PROT 6.6  --   ALBUMIN 3.2* 3.0*   No results for input(s): LIPASE, AMYLASE in the last 168 hours. No results for input(s): AMMONIA in the last 168 hours.  ABG    Component Value Date/Time   PHART 7.377 02/23/2010 2118   PCO2ART 39.5 02/23/2010 2118   PO2ART 124.0 (H) 02/23/2010 2118   HCO3 17.0 (L) 09/29/2021 0853   TCO2 23 02/23/2010 2217   ACIDBASEDEF 9.2 (H) 09/29/2021 0853   O2SAT 57.5 09/29/2021 0853     Coagulation Profile: Recent Labs  Lab 09/29/21 0115  INR 1.3*    Cardiac  Enzymes: No results for input(s): CKTOTAL, CKMB, CKMBINDEX, TROPONINI in the last 168 hours.  HbA1C: Hgb A1c MFr Bld  Date/Time Value Ref Range Status  02/23/2010 05:34 AM  <5.7 % Final   5.4 (NOTE)                                                                       According to the ADA Clinical Practice Recommendations for 2011, when HbA1c is used as a screening test:   >=  6.5%   Diagnostic of Diabetes Mellitus           (if abnormal result  is confirmed)  5.7-6.4%   Increased risk of developing Diabetes Mellitus  References:Diagnosis and Classification of Diabetes Mellitus,Diabetes VOHY,0737,10(GYIRS 1):S62-S69 and Standards of Medical Care in         Diabetes - 2011,Diabetes Care,2011,34  (Suppl 1):S11-S61.    CBG: Recent Labs  Lab 09/29/21 0231  GLUCAP 97    Allergies Allergies  Allergen Reactions   Morphine Anaphylaxis   Oxycodone Other (See Comments)    Hypotension and bradycardia   Carvedilol     Dizziness and syncope    Cefuroxime Axetil Swelling   Iodinated Contrast Media Rash    Other reaction(s): Asthenia (finding), Other (qualifier value) paralyzed legs Other reaction(s): RASH    Lisinopril Cough and Other (See Comments)     Home Medications  Prior to Admission medications   Medication Sig Start Date End Date Taking? Authorizing Provider  acetaminophen (TYLENOL) 500 MG tablet Take 1,000 mg by mouth every 6 (six) hours as needed for mild pain.   Yes [provider]  albuterol (VENTOLIN HFA) 108 (90 Base) MCG/ACT inhaler Inhale 1-2 puffs into the lungs every 6 (six) hours as needed for wheezing or shortness of breath. 01/16/21 01/16/22 Yes [provider]  amiodarone (PACERONE) 200 MG tablet Take 1 tablet (200 mg total) by mouth daily. 06/01/21  Yes Jennye Boroughs, MD  aspirin EC 81 MG tablet Take 1 tablet (81 mg total) by mouth 2 (two) times daily. Swallow whole. 08/10/20  Yes Donne Hazel, MD  calcitRIOL (ROCALTROL) 0.25 MCG capsule Take 0.25  mcg by mouth daily. 07/03/21  Yes [provider]  losartan (COZAAR) 25 MG tablet Take 1 tablet (25 mg total) by mouth daily. Patient taking differently: Take 12.5 mg by mouth daily. 02/08/21  Yes Wouk, Ailene Rud, MD  pravastatin (PRAVACHOL) 20 MG tablet Take 20 mg by mouth every evening.   Yes [provider]  torsemide (DEMADEX) 20 MG tablet Take 1 tablet (20 mg total) by mouth daily. Patient taking differently: Take 20 mg by mouth daily. (May take additional 20mg  tablet if needed for excessive fluid weight) 06/01/21  Yes Jennye Boroughs, MD    Scheduled Meds:  Chlorhexidine Gluconate Cloth  6 each Topical Q0600   feeding supplement  237 mL Oral BID BM   heparin  5,000 Units Subcutaneous Q8H   hydrocortisone sod succinate (SOLU-CORTEF) inj  100 mg Intravenous Q8H   levothyroxine  25 mcg Oral Q0600   midodrine  10 mg Oral TID WC   multivitamin with minerals  1 tablet Oral Daily   mupirocin ointment   Nasal BID   Continuous Infusions:  sodium chloride Stopped (09/29/21 2200)   furosemide (LASIX) 200 mg in dextrose 5% 100 mL (2mg /mL) infusion 6 mg/hr (10/01/21 0707)   norepinephrine (LEVOPHED) Adult infusion 5 mcg/min (10/01/21 0700)   PRN Meds:.docusate sodium, polyethylene glycol   Resolved Hospital Problem list     ASSESSMENT & PLAN  Acute Hypoxic Respiratory Failure secondary to Acute on Chronic Systolic CHF, Pulmonary Edema and  ?Pneumonia Chest xray shows bilateral pleural effusion and opacities , right loculated pleural effusion  -Supplemental O2 as needed to maintain O2 saturations 88 to 92% -Intermittent chest x-ray & ABG PRN -Ensure adequate pulmonary hygiene  -F/u cultures, trend PCT -Budesonide inhaler/nebs BID, bronchodilators PRN  Myxedema Coma (Patient presenting with unresponsiveness, Severe bradycardia requiring external pacing, hypotension on Levophed and severe  hypothermia found to be in severe hypothyroidism) TSH 213, Free T3 0.7, Free T4  <0.25 -s/p IV synthroid 265mcg x 1 -Continue Synthroid 25 mcg daily -Continue stress dose steroids Solucortef 100 mg Q8   Acute on Chronic Chronic Systolic HF with Cardiogenic Shock +/- Septic Shock (EF previously <20%% on 05/22) in the setting of severe hypothyroidism PMHx: CAD status post CABG x5, ICM, HLD,  -Continue levophed to maintain MAP > 65, add midodrine in an attempt to wean off Levo -Assess CVP Q 4 h -Trend troponin, lactic -Continue IV Lasix gtt as blood pressure and renal function permits -Hold BP meds in the setting of hypotension  -Repeat 2D Echocardiogram pending -Cardiology consult -Palliative care consult to assist with [place] discussion with patient and family.   Paroxysmal Atrial Fibrillation -Hold Amiodarone -Not anticoagulated due to hx of significant GI Bleed and Anemia  AKI on CKD Stage III Likely hemodynamically mediated with hypotension (requiring  levophed), Cardiorenal Syndrome, and venous congestion with concomitant ACE, Diurectic and NSAID use Hyponatremia Anemia of Chronic Disease -Monitor I&O's / urinary output -Follow BMP -Ensure adequate renal perfusion -Avoid nephrotoxic agents as able -Replace electrolytes as indicated -Nephrology consult, input appreciated   Best practice (right click and "Reselect all SmartList Selections" daily)  Diet:  Oral Pain/Anxiety/Delirium protocol (if indicated): No VAP protocol (if indicated): Not indicated DVT prophylaxis: Subcutaneous Heparin GI prophylaxis: PPI Glucose control:  SSI No Central venous access:  Yes, and it is still needed Arterial line:  N/A Foley:  Yes, and it is still needed Mobility:  bed rest  PT consulted: N/A Last date of multidisciplinary goals of care discussion [1/12] Code Status:  full code Disposition: ICU  Critical care time: 35 minutes       Rufina Falco, DNP, FNP-C, AGACNP-BC Acute Care Nurse Practitioner  [facility] Pager:  [phone] [place] at [facility]

## 2021-10-02 ENCOUNTER — Inpatient Hospital Stay
Admit: 2021-10-02 | Discharge: 2021-10-02 | Disposition: A | Discharge status: Home / Self Care | Payer: PPO | Attending: Pulmonary Disease | Admitting: Pulmonary Disease

## 2021-10-02 DIAGNOSIS — J9601 Acute respiratory failure with hypoxia: Secondary | ICD-10-CM | POA: Diagnosis not present

## 2021-10-02 DIAGNOSIS — E035 Myxedema coma: Secondary | ICD-10-CM | POA: Diagnosis not present

## 2021-10-02 DIAGNOSIS — R570 Cardiogenic shock: Secondary | ICD-10-CM | POA: Diagnosis not present

## 2021-10-02 LAB — BASIC METABOLIC PANEL
Anion gap: 10 (ref 5–15)
Anion gap: 10 (ref 5–15)
BUN: 134 mg/dL — ABNORMAL HIGH (ref 8–23)
BUN: 125 mg/dL — ABNORMAL HIGH (ref 8–23)
CO2: 19 mmol/L — ABNORMAL LOW (ref 22–32)
CO2: 19 mmol/L — ABNORMAL LOW (ref 22–32)
Calcium: 7.9 mg/dL — ABNORMAL LOW (ref 8.9–10.3)
Calcium: 7.6 mg/dL — ABNORMAL LOW (ref 8.9–10.3)
Chloride: 103 mmol/L (ref 98–111)
Chloride: 104 mmol/L (ref 98–111)
Creatinine, Ser: 4.99 mg/dL — ABNORMAL HIGH (ref 0.61–1.24)
Creatinine, Ser: 4.78 mg/dL — ABNORMAL HIGH (ref 0.61–1.24)
GFR, Estimated: 11 mL/min — ABNORMAL LOW (ref >60)
GFR, Estimated: 11 mL/min — ABNORMAL LOW (ref >60)
Glucose, Bld: 90 mg/dL (ref 70–99)
Glucose, Bld: 143 mg/dL — ABNORMAL HIGH (ref 70–99)
Potassium: 4.2 mmol/L (ref 3.5–5.1)
Potassium: 4 mmol/L (ref 3.5–5.1)
Sodium: 132 mmol/L — ABNORMAL LOW (ref 135–145)
Sodium: 133 mmol/L — ABNORMAL LOW (ref 135–145)

## 2021-10-02 LAB — CBC
HCT: 22.8 % — ABNORMAL LOW (ref 39.0–52.0)
Hemoglobin: 8 g/dL — ABNORMAL LOW (ref 13.0–17.0)
MCH: 35.4 pg — ABNORMAL HIGH (ref 26.0–34.0)
MCHC: 35.1 g/dL (ref 30.0–36.0)
MCV: 100.9 fL — ABNORMAL HIGH (ref 80.0–100.0)
Platelets: 109 10*3/uL — ABNORMAL LOW (ref 150–400)
RBC: 2.26 MIL/uL — ABNORMAL LOW (ref 4.22–5.81)
RDW: 17.3 % — ABNORMAL HIGH (ref 11.5–15.5)
WBC: 6.5 10*3/uL (ref 4.0–10.5)
nRBC: 0 % (ref 0.0–0.2)

## 2021-10-02 LAB — MAGNESIUM: Magnesium: 2.4 mg/dL (ref 1.7–2.4)

## 2021-10-02 LAB — TSH: TSH: 84 u[IU]/mL — ABNORMAL HIGH (ref 0.350–4.500)

## 2021-10-02 LAB — PHOSPHORUS: Phosphorus: 7 mg/dL — ABNORMAL HIGH (ref 2.5–4.6)

## 2021-10-02 MED ORDER — LEVOTHYROXINE SODIUM 50 MCG PO TABS
75.0000 ug | ORAL_TABLET | Freq: Every day | ORAL | Status: DC
  Administered 2021-10-03 – 2021-10-16 (×14): 75 ug via ORAL
  Filled 2021-10-02 (×3): qty 1
  Filled 2021-10-02 (×2): qty 2
  Filled 2021-10-02 (×9): qty 1

## 2021-10-02 MED ORDER — HYDROCORTISONE SOD SUC (PF) 100 MG IJ SOLR
100.0000 mg | Freq: Two times a day (BID) | INTRAMUSCULAR | Status: DC
  Administered 2021-10-02: 100 mg via INTRAVENOUS
  Filled 2021-10-02: qty 2

## 2021-10-02 MED ORDER — LEVOTHYROXINE SODIUM 50 MCG PO TABS
50.0000 ug | ORAL_TABLET | Freq: Once | ORAL | Status: AC
  Administered 2021-10-02: 50 ug via ORAL
  Filled 2021-10-02: qty 1

## 2021-10-02 MED ORDER — ALBUMIN HUMAN 5 % IV SOLN
25.0000 g | Freq: Once | INTRAVENOUS | Status: AC
  Administered 2021-10-02: 25 g via INTRAVENOUS
  Filled 2021-10-02: qty 500

## 2021-10-02 NOTE — TOC Progression Note (Signed)
Transition of Care [facility]) - Progression Note    Patient Details  Name: [name] MRN: [id number] Date of Birth: 05/03/1931  Transition of Care [facility] CM/SW Contact  Shelbie Hutching, RN Phone Number: 10/02/2021, 3:52 PM  Clinical Narrative:    Patient admitted to the hospital with cardiogenic shock came in from home.  History of CHF.  Patient is currently in the ICU on lasix drip and levophed drip.   TOC will follow for needs.        Expected Discharge Plan and Services                                                 Social Determinants of Health (SDOH) Interventions    Readmission Risk Interventions Readmission Risk Prevention Plan 05/31/2021  Transportation Screening Complete  PCP or Specialist Appt within 3-5 Days Complete  Social Work Consult for [place] Planning/Counseling Complete  Palliative Care Screening Not Applicable  Medication Review Press photographer) Complete  Some recent data might be hidden

## 2021-10-02 NOTE — Progress Notes (Signed)
CARDIOLOGY CONSULT NOTE               Patient ID: BOBBY BARTON MRN: [id number] DOB/AGE: 02/05/1931 88 y.o.  Admit date: 09/28/2021 Referring Physician Rufina Falco, NP Primary Physician Dr. Harrel Lemon Primary Cardiologist Dr. Nehemiah Massed Reason for Consultation heart failure, bradycardia  HPI 88 year old male with a past medical history significant for CAD s/p CABG x5 in 2011, HFrEF (LVEF <20%), ischemic cardiomyopathy, CKD stage V, paroxysmal atrial fibrillation, severe AS s/p bioprosthetic valve replacement 2011, AAA (3.2 cm in 2020), history of MGUS, who presented [facility] ED 09/28/2021 after being found minimally responsive in a chair at home with reported vital signs of heart rate in the 20s and blood pressure 80/60 by EMS.  Cardiology was consulted for assistance with his heart failure exacerbation in setting of myxedema coma.   Interval history: -he didn't sleep well last night, falling asleep mid conversation during interview.  -continues to say 'he doesn't feel bad" -off levophed gtt as of yesterday, net negative ~754mL over the last 24 hrs -on lasix 60mg  IV BID  Review of systems complete and found to be negative unless listed above  Past Medical History:  Diagnosis Date   Anemia    Aortic aneurysm (HCC)    Aortic aneurysm (HCC)    Arthritis    CHF (congestive heart failure) (HCC)    CKD (chronic kidney disease)    ALSO LEFT RENAL MASS   Coronary artery disease    Diverticulosis    History of perforated diverticulitis   GERD (gastroesophageal reflux disease)    History of hiatal hernia    Hypercholesteremia    Hypertension    MGUS (monoclonal gammopathy of unknown significance)    MGUS (monoclonal gammopathy of unknown significance)     Past Surgical History:  Procedure Laterality Date   AORTIC VALVE REPLACEMENT     bipass     CARDIAC VALVE REPLACEMENT     pig valve   CHOLECYSTECTOMY     Open   COLON SURGERY     History of Hartman's procedure with  subsequent colostomy reversal   CORONARY ANGIOPLASTY     CORONARY ARTERY BYPASS GRAFT     ESOPHAGOGASTRODUODENOSCOPY (EGD) WITH PROPOFOL N/A 02/07/2015   Procedure: ESOPHAGOGASTRODUODENOSCOPY (EGD) WITH PROPOFOL;  Surgeon: Hulen Luster, MD;  Location: [facility] ENDOSCOPY;  Service: Gastroenterology;  Laterality: N/A;   EYE SURGERY     HERNIA REPAIR     INTRAMEDULLARY (IM) NAIL INTERTROCHANTERIC Right 05/12/2020   Procedure: INTRAMEDULLARY (IM) NAIL INTERTROCHANTRIC;  Surgeon: Lovell Sheehan, MD;  Location: [facility] ORS;  Service: Orthopedics;  Laterality: Right;   JOINT REPLACEMENT     left knee x4   KNEE ARTHROPLASTY Right 04/02/2016   Procedure: COMPUTER ASSISTED TOTAL KNEE ARTHROPLASTY;  Surgeon: Dereck Leep, MD;  Location: [facility] ORS;  Service: Orthopedics;  Laterality: Right;   ORIF FEMUR FRACTURE Right 08/08/2020   Procedure: OPEN REDUCTION INTERNAL FIXATION (ORIF) DISTAL FEMUR FRACTURE;  Surgeon: Lovell Sheehan, MD;  Location: [facility] ORS;  Service: Orthopedics;  Laterality: Right;    Medications Prior to Admission  Medication Sig Dispense Refill Last Dose   acetaminophen (TYLENOL) 500 MG tablet Take 1,000 mg by mouth every 6 (six) hours as needed for mild pain.   Unknown at PRN   albuterol (VENTOLIN HFA) 108 (90 Base) MCG/ACT inhaler Inhale 1-2 puffs into the lungs every 6 (six) hours as needed for wheezing or shortness of breath.   Unknown at PRN   amiodarone (  PACERONE) 200 MG tablet Take 1 tablet (200 mg total) by mouth daily. 60 tablet 1 09/28/2021 at 0800   aspirin EC 81 MG tablet Take 1 tablet (81 mg total) by mouth 2 (two) times daily. Swallow whole. 30 tablet 0 09/28/2021 at 0800   calcitRIOL (ROCALTROL) 0.25 MCG capsule Take 0.25 mcg by mouth daily.   09/28/2021 at 0800   losartan (COZAAR) 25 MG tablet Take 1 tablet (25 mg total) by mouth daily. (Patient taking differently: Take 12.5 mg by mouth daily.) 30 tablet 1 09/28/2021 at 0800   pravastatin (PRAVACHOL) 20 MG tablet Take 20 mg by mouth  every evening.   09/27/2021 at 2000   torsemide (DEMADEX) 20 MG tablet Take 1 tablet (20 mg total) by mouth daily. (Patient taking differently: Take 20 mg by mouth daily. (May take additional 20mg  tablet if needed for excessive fluid weight)) 60 tablet 1 09/28/2021 at 0800   Social History   Socioeconomic History   Marital status: Widowed    Spouse name: Not on file   Number of children: Not on file   Years of education: Not on file   Highest education level: Not on file  Occupational History   Not on file  Tobacco Use   Smoking status: Former   Smokeless tobacco: Never  Vaping Use   Vaping Use: Never used  Substance and Sexual Activity   Alcohol use: No   Drug use: No   Sexual activity: Not on file  Other Topics Concern   Not on file  Social History Narrative   Not on file   Social Determinants of Health   Financial Resource Strain: Not on file  Food Insecurity: Not on file  Transportation Needs: Not on file  Physical Activity: Not on file  Stress: Not on file  Social Connections: Not on file  Intimate Partner Violence: Not on file    Family History  Problem Relation Age of Onset   Hypertension Mother    Heart failure Mother    Heart failure Father    Bladder Cancer Neg Hx    Prostate cancer Neg Hx    Kidney cancer Neg Hx     Review of systems complete and found to be negative unless listed above  PHYSICAL EXAM General: Pleasant elderly and frail appearing Caucasian male, well nourished, in no acute distress. Laying in ICU bed at incline, sleeping.  HEENT:  Normocephalic and atraumatic. Neck:  No JVD.  Lungs: Normal respiratory effort on 2 L by nasal cannula.  Clear to auscultation anteriorly Heart: HRRR . Normal S1 and S2. Radial pulses 2+ bilaterally. Abdomen: Non-distended appearing.  Msk: Normal strength and tone for age. Extremities: No clubbing, cyanosis.  Trace bilateral pedal edema and associated chronic venous stasis changes in bilateral lower  extremities.  Ecchymosis on dorsum of left hand.  Neuro: somnolent, unable to assess Psych:  unable to assess  Labs:   Lab Results  Component Value Date   WBC 6.5 10/02/2021   HGB 8.0 (L) 10/02/2021   HCT 22.8 (L) 10/02/2021   MCV 100.9 (H) 10/02/2021   PLT 109 (L) 10/02/2021    Recent Labs  Lab 09/28/21 2147 09/29/21 0520 10/02/21 0411  NA 130*   < > 132*  K 4.3   < > 4.2  CL 102   < > 103  CO2 16*   < > 19*  BUN 133*   < > 134*  CREATININE 4.69*   < > 4.99*  CALCIUM 7.9*   < >  7.9*  PROT 6.6  --   --   BILITOT 1.0  --   --   ALKPHOS 63  --   --   ALT 20  --   --   AST 33  --   --   GLUCOSE 102*   < > 90   < > = values in this interval not displayed.    No results found for: CKTOTAL, CKMB, CKMBINDEX, TROPONINI No results found for: CHOL No results found for: HDL No results found for: LDLCALC No results found for: TRIG No results found for: CHOLHDL No results found for: LDLDIRECT    Radiology: CT Head Wo Contrast  Result Date: 09/28/2021 CLINICAL DATA:  Altered mental status.  Unresponsive. EXAM: CT HEAD WITHOUT CONTRAST TECHNIQUE: Contiguous axial images were obtained from the base of the skull through the vertex without intravenous contrast. RADIATION DOSE REDUCTION: This exam was performed according to the departmental dose-optimization program which includes automated exposure control, adjustment of the mA and/or kV according to patient size and/or use of iterative reconstruction technique. COMPARISON:  05/11/2020 FINDINGS: Brain: Stable degree of atrophy and chronic small vessel ischemia. No intracranial hemorrhage, mass effect, or midline shift. No hydrocephalus. The basilar cisterns are patent. No evidence of territorial infarct or acute ischemia. No extra-axial or intracranial fluid collection. Vascular: Atherosclerosis of skullbase vasculature without hyperdense vessel or abnormal calcification. Patchy air in the cavernous sinuses, typically related to IV catheter  placement. Skull: No fracture or focal lesion. Sinuses/Orbits: No evidence of acute fracture. No paranasal sinus inflammation. Bilateral cataract resection. Mastoid air cells are clear. Other: There is air within the subcutaneous soft tissues about the facial bones and temporal as musculature. This is typically related to IV catheter placement. IMPRESSION: 1. No acute intracranial abnormality. 2. Stable atrophy and chronic small vessel ischemia. 3. Patchy air in the cavernous sinuses and periorbital soft tissues, typically related to IV catheter placement. Electronically Signed   By: Keith Rake M.D.   On: 09/28/2021 21:06   DG Chest Port 1 View  Result Date: 09/30/2021 CLINICAL DATA:  Encounter for central line placement EXAM: PORTABLE CHEST 1 VIEW COMPARISON:  Chest x-ray 09/30/2021 5:47 p.m., CT chest 05/19/2021, chest x-ray 09/29/2021 FINDINGS: Interval advancement of right internal jugular central venous catheter with tip overlying the expected region of the superior caval junction. Lines and tubes overlie the patient. Stable enlarged cardiac silhouette. The heart and mediastinal contours are unchanged. Aortic calcification prominent hilar vasculature. Patchy airspace opacities within the right lower lobe. Limited evaluation left lower lobe due to overlying cardiac paddle. Increased interstitial markings. Bilateral trace to small volume pleural effusion. No pneumothorax. No acute osseous abnormality. IMPRESSION: 1. Interval advancement of right internal jugular central venous catheter with tip overlying the expected region of the superior caval junction. 2. Pulmonary edema. 3. Bilateral trace to small volume pleural effusion. 4. Patchy right lower lobe opacities suggestive of infection/inflammation. Followup PA and lateral chest X-ray is recommended in 3-4 weeks following therapy to ensure resolution and exclude underlying malignancy. Electronically Signed   By: Iven Finn M.D.   On: 09/30/2021  19:12   DG Chest Port 1 View  Result Date: 09/30/2021 CLINICAL DATA:  Central line placement. Clinical concern for central lying being pulled back. EXAM: PORTABLE CHEST 1 VIEW COMPARISON:  09/29/2021. FINDINGS: Right internal jugular central venous line now has its tip projecting over the right apex consistent with placement in the internal jugular vein superior to its confluence with the subclavian vein.  No pneumothorax. Interstitial and hazy airspace lung opacities are noted that are without significant change from the prior exam. Small bilateral pleural effusions are stable. IMPRESSION: 1. Retracted right internal jugular central venous line as detailed. 2. No other change from the prior chest radiograph. Electronically Signed   By: Lajean Manes M.D.   On: 09/30/2021 18:05   DG Chest Port 1 View  Result Date: 09/29/2021 CLINICAL DATA:  Encounter for central line placement EXAM: PORTABLE CHEST 1 VIEW COMPARISON:  Yesterday FINDINGS: New right IJ line with tip at the upper cavoatrial junction. Cardiomegaly. Prior CABG and aortic valve replacement. Pleural effusions and pulmonary opacity which are unchanged. No visible pneumothorax. IMPRESSION: 1. Right IJ line without complicating feature. 2. Stable pleural effusions and pulmonary opacity. Electronically Signed   By: Jorje Guild M.D.   On: 09/29/2021 04:42   DG Chest Portable 1 View  Result Date: 09/28/2021 CLINICAL DATA:  Altered mental status. EXAM: PORTABLE CHEST 1 VIEW COMPARISON:  07/28/2021 FINDINGS: Post median sternotomy with prosthetic cardiac valve. The heart is enlarged. Bilateral pleural effusions and bibasilar opacities, mildly increased. Right pleural effusion is likely loculated tracking towards the apex. Chronic interstitial coarsening. No pneumothorax. IMPRESSION: Cardiomegaly with bilateral pleural effusions and bibasilar opacities, mildly increased from prior radiograph. Chronic interstitial coarsening. Electronically Signed    By: Keith Rake M.D.   On: 09/28/2021 20:58    ECHO 01/2021 LVEF less than 20%, mild aortic stenosis  TELEMETRY reviewed by me: Junctional rhythm rate of 56  EKG reviewed by me: Junctional rhythm rate of 52  ASSESSMENT AND PLAN:  77-year-old male with a past medical history significant for CAD s/p CABG x5 in 2011, HFrEF (LVEF <20%), ischemic cardiomyopathy, CKD stage IV, paroxysmal atrial fibrillation, severe AS s/p bioprosthetic valve replacement 2011, AAA (3.2 cm in 2020), history of MGUS, who presented to [facility] ED 09/28/2021 after being found minimally responsive in a chair at home with reported vital signs of temperature 93 F, heart rate in the 20s and blood pressure 80/60 by EMS.  Cardiology was consulted for assistance with his heart failure exacerbation in setting of myxedema coma.   #Severe hypothyroidism / myxedema coma ?History of thyroid disorder. Patient presented hypothermic, mildly hyponatremic, bradycardic and hypotensive.  TSH > 200.  -Clinically improving.  -Management per primary team. Likely driving shock state.   #Acute on chronic HFrEF (LVEF less than 20% 01/2021) / cardiogenic shock #Ischemic cardiomyopathy #Stage V CKD #Macrocytic anemia -- Treatment of hypothyroidism is key.  - Levophed gtt. stopped the afternoon of 1/15.  Remains on midodirine. - lasix infusion discontinued 1/15, now on 60 mg IV twice daily.  Continue to monitor I's and O's. -Echocardiogram complete performed today and formal read is pending.  Will follow results. -Home medications on hold for now for hypotension. losartan 12.5 mg once daily, torsemide 20 mg once daily with as needed additional 20 mg if he gained weight.  We will escalate GDMT as appropriate in patient's clinical course.  #Acute hypoxic respiratory failure supportive care per primary team, on 2 L of oxygen at baseline.   #CAD s/p CABGx5 2011 #Hyperlipidemia Continue pravastatin 20 milligrams and aspirin  #Paroxysmal atrial  fibrillation Hold amiodarone. On 200 mg once daily at home, not anticoagulated due to patient preference and concerns of significant bleeding and his known anemia.  This patient's case was discussed with Dr. Serafina Royals and he is in agreement.   Signed: Tristan Schroeder , PA-C 10/02/2021, 9:59 AM

## 2021-10-02 NOTE — Progress Notes (Signed)
°   10/02/21 1000  Clinical Encounter Type  Visited With Patient and family together  Visit Type Initial;Social support  Referral From Nurse   Chaplain engaged with patient and visiting daughter-in-law to provide a compassionate presence and support.

## 2021-10-02 NOTE — Progress Notes (Signed)
[place] for Electrolyte Monitoring and Replacement   Recent Labs: Potassium (mmol/L)  Date Value  10/02/2021 4.2  12/09/2014 4.7   Magnesium (mg/dL)  Date Value  10/02/2021 2.4   Calcium (mg/dL)  Date Value  10/02/2021 7.9 (L)   Calcium, Total (mg/dL)  Date Value  12/09/2014 8.5 (L)   Albumin (g/dL)  Date Value  10/01/2021 3.0 (L)   Phosphorus (mg/dL)  Date Value  10/02/2021 7.0 (H)   Sodium (mmol/L)  Date Value  10/02/2021 132 (L)  12/09/2014 126 (L)   Assessment: 88 year old male presented with bradycardia PTA, initially being paced on arrival. Labs revealed significantly elevated TSH and low T4. Patient followed by Nephrology outpatient, awaiting PD catheter placement. Cardiology also consulted, patient with HFrEF. Patient was started on Lasix infusion for volume overload but currently on 60 mg IV q12h. He remains on low-dose norepinephrine. Pharmacy consult to follow and replace electrolytes.   Diuretics: Lasix 60 mg IV q12h  Goal of Therapy:  Electrolytes within normal limits  Plan:  No electrolyte replacement indicated at this time; worsening Scr noted Follow-up electrolytes with AM labs tomorrow  Benita Gutter 10/02/2021 7:57 AM

## 2021-10-02 NOTE — Progress Notes (Signed)
[facility]  ROUNDING NOTE   Subjective:   Transitioned to IV furosemide 60mg  q12  Weaned off Norepinephrine gtt  UOP 1373mL.   Creatinine 4.99.   Objective:  Vital signs in last 24 hours:  Temp:  [95 F (35 C)-99.1 F (37.3 C)] 97.5 F (36.4 C) (01/16 0900) Pulse Rate:  [50-63] 62 (01/16 0900) Resp:  [9-25] 15 (01/16 0900) BP: (75-111)/(54-71) 101/67 (01/16 0900) SpO2:  [91 %-100 %] 98 % (01/16 0900) FiO2 (%):  [21 %] 21 % (01/16 0800) Weight:  [74.8 kg] 74.8 kg (01/16 0412)  Weight change: 0.2 kg Filed Weights   09/30/21 2015 10/01/21 0119 10/02/21 0412  Weight: 74.6 kg 74.4 kg 74.8 kg    Intake/Output: I/O last 3 completed shifts: In: 1242.9 [P.O.:870; I.V.:372.9] Out: 2300 [Urine:2300]   Intake/Output this shift:  Total I/O In: 350 [P.O.:350] Out: 200 [Urine:200]  Physical Exam: General: Chronically ill-appearing, sitting up in bed  Head: Normocephalic, atraumatic. Moist oral mucosal membranes  Eyes: Anicteric  Neck: Supple  Lungs:  Basilar rales, normal effort, room air  Heart: regular  Abdomen:  Soft, nontender, bowel sounds present  Extremities: No lower extremity edema.  Neurologic: Awake, alert, following commands  Skin: No acute rash  Access: NONE    Basic Metabolic Panel: Recent Labs  Lab 09/28/21 2147 09/29/21 0520 09/29/21 2017 09/30/21 0549 10/01/21 0411 10/02/21 0411  NA 130* 133*  --  132* 132* 132*  K 4.3 4.2 4.3 3.9 3.6 4.2  CL 102 104  --  102 102 103  CO2 16* 18*  --  18* 18* 19*  GLUCOSE 102* 117*  --  121* 112* 90  BUN 133* 127*  --  126* 116* 134*  CREATININE 4.69* 4.69*  --  4.47* 4.49* 4.99*  CALCIUM 7.9* 8.0*  --  8.0* 8.1* 7.9*  MG  --  2.4  --  2.4 2.3 2.4  PHOS  --  6.6*  --  6.4* 6.4* 7.0*     Liver Function Tests: Recent Labs  Lab 09/28/21 2147 10/01/21 0411  AST 33  --   ALT 20  --   ALKPHOS 63  --   BILITOT 1.0  --   PROT 6.6  --   ALBUMIN 3.2* 3.0*    No results for input(s):  LIPASE, AMYLASE in the last 168 hours. No results for input(s): AMMONIA in the last 168 hours.  CBC: Recent Labs  Lab 09/28/21 2015 09/29/21 0520 09/30/21 0549 10/01/21 0411 10/02/21 0411  WBC 4.9 5.1 6.1 6.9 6.5  NEUTROABS 2.9  --   --   --   --   HGB 8.7* 8.9* 9.4* 9.0* 8.0*  HCT 26.4* 26.7* 27.2* 26.2* 22.8*  MCV 105.6* 101.5* 100.0 101.2* 100.9*  PLT 134* 144* 141* 143* 109*     Cardiac Enzymes: No results for input(s): CKTOTAL, CKMB, CKMBINDEX, TROPONINI in the last 168 hours.  BNP: Invalid input(s): POCBNP  CBG: Recent Labs  Lab 09/29/21 0231  VEHMCN 47     Microbiology: Results for orders placed or performed during the hospital encounter of 09/28/21  Resp Panel by RT-PCR (Flu A&B, Covid) Nasopharyngeal Swab     Status: None   Collection Time: 09/28/21  8:19 PM   Specimen: Nasopharyngeal Swab; Nasopharyngeal(NP) swabs in vial transport medium  Result Value Ref Range Status   SARS Coronavirus 2 by RT PCR NEGATIVE NEGATIVE Final    Comment: (NOTE) SARS-CoV-2 target nucleic acids are NOT DETECTED.  The SARS-CoV-2 RNA  is generally detectable in upper respiratory specimens during the acute phase of infection. The lowest concentration of SARS-CoV-2 viral copies this assay can detect is 138 copies/mL. A negative result does not preclude SARS-Cov-2 infection and should not be used as the sole basis for treatment or other patient management decisions. A negative result may occur with  improper specimen collection/handling, submission of specimen other than nasopharyngeal swab, presence of viral mutation(s) within the areas targeted by this assay, and inadequate number of viral copies(<138 copies/mL). A negative result must be combined with clinical observations, patient history, and epidemiological information. The expected result is Negative.  Fact Sheet for Patients:  EntrepreneurPulse.com.au  Fact Sheet for Healthcare Providers:   IncredibleEmployment.be  This test is no t yet approved or cleared by the [place] FDA and  has been authorized for detection and/or diagnosis of SARS-CoV-2 by FDA under an Emergency Use Authorization (EUA). This EUA will remain  in effect (meaning this test can be used) for the duration of the COVID-19 declaration under Section 564(b)(1) of the Act, 21 U.S.C.section 360bbb-3(b)(1), unless the authorization is terminated  or revoked sooner.       Influenza A by PCR NEGATIVE NEGATIVE Final   Influenza B by PCR NEGATIVE NEGATIVE Final    Comment: (NOTE) The Xpert Xpress SARS-CoV-2/FLU/RSV plus assay is intended as an aid in the diagnosis of influenza from Nasopharyngeal swab specimens and should not be used as a sole basis for treatment. Nasal washings and aspirates are unacceptable for Xpert Xpress SARS-CoV-2/FLU/RSV testing.  Fact Sheet for Patients: EntrepreneurPulse.com.au  Fact Sheet for Healthcare Providers: IncredibleEmployment.be  This test is not yet approved or cleared by the [place] FDA and has been authorized for detection and/or diagnosis of SARS-CoV-2 by FDA under an Emergency Use Authorization (EUA). This EUA will remain in effect (meaning this test can be used) for the duration of the COVID-19 declaration under Section 564(b)(1) of the Act, 21 U.S.C. section 360bbb-3(b)(1), unless the authorization is terminated or revoked.  Performed at [facility], [address] [place], [place] [address]   Culture, blood (routine x 2)     Status: None (Preliminary result)   Collection Time: 09/28/21  9:47 PM   Specimen: BLOOD  Result Value Ref Range Status   Specimen Description BLOOD LEFT FOREARM  Final   Special Requests   Final    BOTTLES DRAWN AEROBIC AND ANAEROBIC Blood Culture adequate volume   Culture   Final    NO GROWTH 4 DAYS Performed at [facility], [address] [place], [place] [address]    Report Status PENDING  Incomplete  Culture, blood (routine x 2)     Status: None (Preliminary result)   Collection Time: 09/28/21  9:47 PM   Specimen: BLOOD  Result Value Ref Range Status   Specimen Description BLOOD LEFT FOREARM  Final   Special Requests IN PEDIATRIC BOTTLE Blood Culture adequate volume  Final   Culture   Final    NO GROWTH 4 DAYS Performed at [facility], [address], [place], [place] [address]    Report Status PENDING  Incomplete  MRSA Next Gen by PCR, Nasal     Status: Abnormal   Collection Time: 09/29/21  2:38 AM   Specimen: Nasal Mucosa; Nasal Swab  Result Value Ref Range Status   MRSA by PCR Next Gen DETECTED (A) NOT DETECTED Final    Comment: RESULT CALLED TO, READ BACK BY AND VERIFIED WITH: Hansel Starling @0353   on 09/29/21 SKL (NOTE) The GeneXpert MRSA Assay (FDA approved for NASAL specimens only), is one component of a comprehensive MRSA colonization surveillance program. It is not intended to diagnose MRSA infection nor to guide or monitor treatment for MRSA infections. Test performance is not FDA approved in patients less than 59 years old. Performed at [facility], [address] [place], [place] [address]     Coagulation Studies: No results for input(s): LABPROT, INR in the last 72 hours.   Urinalysis: No results for input(s): COLORURINE, LABSPEC, PHURINE, GLUCOSEU, HGBUR, BILIRUBINUR, KETONESUR, PROTEINUR, UROBILINOGEN, NITRITE, LEUKOCYTESUR in the last 72 hours.  Invalid input(s): APPERANCEUR     Imaging: DG Chest Port 1 View  Result Date: 09/30/2021 CLINICAL DATA:  Encounter for central line placement EXAM: PORTABLE CHEST 1 VIEW COMPARISON:  Chest x-ray 09/30/2021 5:47 p.m., CT chest 05/19/2021, chest x-ray 09/29/2021 FINDINGS: Interval advancement of right internal jugular central venous catheter with tip overlying the expected region of the superior caval junction. Lines and tubes  overlie the patient. Stable enlarged cardiac silhouette. The heart and mediastinal contours are unchanged. Aortic calcification prominent hilar vasculature. Patchy airspace opacities within the right lower lobe. Limited evaluation left lower lobe due to overlying cardiac paddle. Increased interstitial markings. Bilateral trace to small volume pleural effusion. No pneumothorax. No acute osseous abnormality. IMPRESSION: 1. Interval advancement of right internal jugular central venous catheter with tip overlying the expected region of the superior caval junction. 2. Pulmonary edema. 3. Bilateral trace to small volume pleural effusion. 4. Patchy right lower lobe opacities suggestive of infection/inflammation. Followup PA and lateral chest X-ray is recommended in 3-4 weeks following therapy to ensure resolution and exclude underlying malignancy. Electronically Signed   By: Iven Finn M.D.   On: 09/30/2021 19:12   DG Chest Port 1 View  Result Date: 09/30/2021 CLINICAL DATA:  Central line placement. Clinical concern for central lying being pulled back. EXAM: PORTABLE CHEST 1 VIEW COMPARISON:  09/29/2021. FINDINGS: Right internal jugular central venous line now has its tip projecting over the right apex consistent with placement in the internal jugular vein superior to its confluence with the subclavian vein. No pneumothorax. Interstitial and hazy airspace lung opacities are noted that are without significant change from the prior exam. Small bilateral pleural effusions are stable. IMPRESSION: 1. Retracted right internal jugular central venous line as detailed. 2. No other change from the prior chest radiograph. Electronically Signed   By: Lajean Manes M.D.   On: 09/30/2021 18:05     Medications:    sodium chloride Stopped (09/29/21 2200)   norepinephrine (LEVOPHED) Adult infusion Stopped (10/01/21 1453)    Chlorhexidine Gluconate Cloth  6 each Topical Q0600   feeding supplement  237 mL Oral BID BM    furosemide  60 mg Intravenous Q12H   heparin  5,000 Units Subcutaneous Q8H   hydrocortisone sod succinate (SOLU-CORTEF) inj  100 mg Intravenous Q8H   levothyroxine  25 mcg Oral Q0600   midodrine  10 mg Oral TID WC   multivitamin with minerals  1 tablet Oral Daily   mupirocin ointment   Nasal BID   docusate sodium, polyethylene glycol  Assessment/ Plan:   [name] is a 88 y.o. white male with hypertension, coronary artery disease, aortic valve replacement, osteoarthritis, diverticulosis, ruptured abdominal viscus, history of hyponatremia, history of hiatal hernia, abdominal aortic aneurysm, constipation, right hip fracture status post repair, chronic systolic heart failure who presents to [facility] on 09/28/2021 for Cardiogenic shock [facility]) [  R57.0] Anasarca [R60.1] Uremia [N19] Junctional bradycardia [R00.1] Acute on chronic systolic congestive heart failure (HCC) [I50.23] Chronic kidney disease, unspecified CKD stage [N18.9]  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 Started on IV levothyroxine and now on levothyroxine 25mg  daily. Also on stress dose steroids.   1. Acute kidney injury on Chronic kidney disease stage V: baseline creatinine of 3.9 on 09/04/21. Patient with chronic kidney disease secondary to hypertension and vascular disease. Acute kidney injury secondary to acute exacerbation of chronic systolic congestive heart failure:   - holding losartan due to hypotension and acute kidney injury - Low threshold to initiate dialysis with uremia, metabolic acidosis, hyponatremia and hyperphosphatemia. - Continue furosemide IV for one more day.  - Continue to monitor volume status, urine output and renal function.  - Family was interested in peritoneal dialysis prior to admission. Needs cardiology clearance before proceeding with PD catheter placement.   2. Hypotension: with acute exacerbation of systolic congestive heart failure: echo from 01/31/21 with Ejection Fraction of  <20%. Cardiogenic shock. Holding home regimen of losartan and PO torsemide.  - weaned off vasopressors - IV furosemide.   3. Anemia with chronic kidney disease: hemoglobin 8. Discussed ESA with patient.  Precipitous drop. May need to evaluate for GI bleed.   4. Hyponatremia: secondary to chronic kidney disease and congestive heart failure.  - Continue furosemide as above, one more day.   5. Secondary Hyperparathyroidism: PTH 110 on 08/31/21. Holding calcitriol.     LOS: [place] 1/16/202310:09 AM

## 2021-10-02 NOTE — Progress Notes (Signed)
NAME:  [name], MRN:  [id number], DOB:  September 10, 1931, LOS: 4 ADMISSION DATE:  09/28/2021, INITIAL CONSULTATION DATE:  09/29/2021 REFERRING MD:  Blake Divine, MD  CHIEF COMPLAINT:  Unresponsiveness   Brief Patient Description  88 yr. male who presented to the ED with unresponsiveness. Per ED reports, patient was found in a chair at his home with heart rate in the 20s and BP was 80/60.  EMS initiated pacing at 70 bpm and 110 mA following administration of 2 mg of IV Versed.  He was also started on Levophed drip for hypotension and transported to the ED.  ED Course: On arrival to the ED, he was initially somnolent but arousable to voice and complaining of chest pain where he is being shocked.  Pacing was stopped and patient was noted to have a heart rate in the 50s consistent with junctional bradycardia.  He was hypothermic 94.5 F (34.7 C) with blood pressure 81/51 mm Hg and pulse rate 51 beats/min., RR 21 and oxygen saturation of 100% on 15 L. Chest X-ray>Cardiomegaly with bilateral pleural effusions and bibasilar Opacities. Pertinent Labs in Findings: Na+/K+:130/4.3, Glucose: 120, BUN/Cr:133/4.69, Calcium: 7.9, WBC:4.9, Hgb/Hct:8.7/26.4, PCT: Negative<0.10, Lactic acid: 1.8, COVID PCR: Negative, Troponin: 40, BNP: 2137, TSH: 213. Given worsening hypotension and suspected cardiogenic shock, patient started on Levophed. PCCM consulted.  Pertinent  Medical History  CKD stage IV, hypertension, proteinuria, hyperparathyroidism due to renal insufficiency, hyperlipidemia, aspiration pneumonia, Systolic HF EF of 98-92%, ischemic cardiomyopathy, bilateral carotid stenosis, severe aortic stenosis, AAA, MGUS, paroxysmal SVT, CABG status post LIMA to LAD, SVG to OM1, OM2, D1, PDA, status post bioprosthetic valve replacement in 2011  [facility] Events: Including procedures, antibiotic start and stop dates in addition to other pertinent events   1/13: Admitted to ICU with Acute on Chronic CHF  exacerbation in the setting of severe hypothyroidism 1/14: Started on Lasix gtt 1/15: Remains on Lasix gtt and Levo  Cultures:  1/12: SARS-CoV-2 PCR> negative 1/12: Influenza PCR> negative 1/12: Blood culture x2> 1/12: Urine Culture> 1/12: MRSA PCR>> POSITIVE 1/12: Strep pneumo urinary antigen> 1/12: Legionella urinary antigen> Antimicrobials:  Vancomycin 1/13>STOPPED Cefazolin 1/13> STOPPED  Interim History / Subjective:  -No significant events overnight, remains on Levo and Lasix gtt -Urine output adequate 2242ml/24 hour  OBJECTIVE   Blood pressure 101/67, pulse 62, temperature (!) 97.5 F (36.4 C), resp. rate 15, height 6' (1.829 m), weight 74.8 kg, SpO2 98 %.    FiO2 (%):  [21 %] 21 %   Intake/Output Summary (Last 24 hours) at 10/02/2021 0953 Last data filed at 10/02/2021 0900 Gross per 24 hour  Intake 802.2 ml  Output 1500 ml  Net -697.8 ml    Filed Weights   09/30/21 2015 10/01/21 0119 10/02/21 0412  Weight: 74.6 kg 74.4 kg 74.8 kg    Physical Examination: GENERAL: 88 year-old critically ill patient lying in the bed with no acute distress.  EYES: Pupils equal, round, reactive to light and accommodation. No scleral icterus. Extraocular muscles intact.  HEENT: Head atraumatic, normocephalic. Oropharynx and nasopharynx clear.  NECK:  Supple, no jugular venous distention. No thyroid enlargement, no tenderness.  LUNGS: Decreased breath sounds bilaterally, no wheezing, mild to moderate rales, crackles and rhonchi. No use of accessory muscles of respiration.  CARDIOVASCULAR: S1, S2 normal. No murmurs, rubs, or gallops.  ABDOMEN: Soft, nontender, nondistended. Bowel sounds present. No organomegaly or mass.  EXTREMITIES: +1 pitting edema of lower extremities, cyanosis, or clubbing.  NEUROLOGIC: Cranial nerves II through XII  are intact.  Muscle strength 3/5 in all extremities. Sensation intact. Gait not checked.  PSYCHIATRIC: The patient is alert and oriented x 3.  SKIN:  No obvious rash, lesion, or ulcer.   Labs/imaging that I havepersonally reviewed  (right click and "Reselect all SmartList Selections" daily)    Labs   CBC: Recent Labs  Lab 09/28/21 2015 09/29/21 0520 09/30/21 0549 10/01/21 0411 10/02/21 0411  WBC 4.9 5.1 6.1 6.9 6.5  NEUTROABS 2.9  --   --   --   --   HGB 8.7* 8.9* 9.4* 9.0* 8.0*  HCT 26.4* 26.7* 27.2* 26.2* 22.8*  MCV 105.6* 101.5* 100.0 101.2* 100.9*  PLT 134* 144* 141* 143* 109*     Basic Metabolic Panel: Recent Labs  Lab 09/28/21 2147 09/29/21 0520 09/29/21 2017 09/30/21 0549 10/01/21 0411 10/02/21 0411  NA 130* 133*  --  132* 132* 132*  K 4.3 4.2 4.3 3.9 3.6 4.2  CL 102 104  --  102 102 103  CO2 16* 18*  --  18* 18* 19*  GLUCOSE 102* 117*  --  121* 112* 90  BUN 133* 127*  --  126* 116* 134*  CREATININE 4.69* 4.69*  --  4.47* 4.49* 4.99*  CALCIUM 7.9* 8.0*  --  8.0* 8.1* 7.9*  MG  --  2.4  --  2.4 2.3 2.4  PHOS  --  6.6*  --  6.4* 6.4* 7.0*    GFR: Estimated Creatinine Clearance: 10.8 mL/min (A) (by C-G formula based on SCr of 4.99 mg/dL (H)). Recent Labs  Lab 09/29/21 0006 09/29/21 0520 09/29/21 0856 09/29/21 1105 09/30/21 0549 10/01/21 0411 10/02/21 0411  PROCALCITON <0.10  --   --   --   --   --   --   WBC  --  5.1  --   --  6.1 6.9 6.5  LATICACIDVEN 1.0 0.8 0.9 0.9  --   --   --      Liver Function Tests: Recent Labs  Lab 09/28/21 2147 10/01/21 0411  AST 33  --   ALT 20  --   ALKPHOS 63  --   BILITOT 1.0  --   PROT 6.6  --   ALBUMIN 3.2* 3.0*    No results for input(s): LIPASE, AMYLASE in the last 168 hours. No results for input(s): AMMONIA in the last 168 hours.  ABG    Component Value Date/Time   PHART 7.377 02/23/2010 2118   PCO2ART 39.5 02/23/2010 2118   PO2ART 124.0 (H) 02/23/2010 2118   HCO3 17.0 (L) 09/29/2021 0853   TCO2 23 02/23/2010 2217   ACIDBASEDEF 9.2 (H) 09/29/2021 0853   O2SAT 57.5 09/29/2021 0853      Coagulation Profile: Recent Labs  Lab  09/29/21 0115  INR 1.3*     Cardiac Enzymes: No results for input(s): CKTOTAL, CKMB, CKMBINDEX, TROPONINI in the last 168 hours.  HbA1C: Hgb A1c MFr Bld  Date/Time Value Ref Range Status  02/23/2010 05:34 AM  <5.7 % Final   5.4 (NOTE)                                                                       According to the ADA Clinical Practice Recommendations for 2011, when  HbA1c is used as a screening test:   >=6.5%   Diagnostic of Diabetes Mellitus           (if abnormal result  is confirmed)  5.7-6.4%   Increased risk of developing Diabetes Mellitus  References:Diagnosis and Classification of Diabetes Mellitus,Diabetes DZHG,9924,26(STMHD 1):S62-S69 and Standards of Medical Care in         Diabetes - 2011,Diabetes QQIW,9798,92  (Suppl 1):S11-S61.    CBG: Recent Labs  Lab 09/29/21 0231  GLUCAP 97     Allergies Allergies  Allergen Reactions   Morphine Anaphylaxis   Oxycodone Other (See Comments)    Hypotension and bradycardia   Carvedilol     Dizziness and syncope    Cefuroxime Axetil Swelling   Iodinated Contrast Media Rash    Other reaction(s): Asthenia (finding), Other (qualifier value) paralyzed legs Other reaction(s): RASH    Lisinopril Cough and Other (See Comments)     Home Medications  Prior to Admission medications   Medication Sig Start Date End Date Taking? Authorizing Provider  acetaminophen (TYLENOL) 500 MG tablet Take 1,000 mg by mouth every 6 (six) hours as needed for mild pain.   Yes [provider]  albuterol (VENTOLIN HFA) 108 (90 Base) MCG/ACT inhaler Inhale 1-2 puffs into the lungs every 6 (six) hours as needed for wheezing or shortness of breath. 01/16/21 01/16/22 Yes [provider]  amiodarone (PACERONE) 200 MG tablet Take 1 tablet (200 mg total) by mouth daily. 06/01/21  Yes Jennye Boroughs, MD  aspirin EC 81 MG tablet Take 1 tablet (81 mg total) by mouth 2 (two) times daily. Swallow whole. 08/10/20  Yes Donne Hazel, MD   calcitRIOL (ROCALTROL) 0.25 MCG capsule Take 0.25 mcg by mouth daily. 07/03/21  Yes [provider]  losartan (COZAAR) 25 MG tablet Take 1 tablet (25 mg total) by mouth daily. Patient taking differently: Take 12.5 mg by mouth daily. 02/08/21  Yes Wouk, Ailene Rud, MD  pravastatin (PRAVACHOL) 20 MG tablet Take 20 mg by mouth every evening.   Yes [provider]  torsemide (DEMADEX) 20 MG tablet Take 1 tablet (20 mg total) by mouth daily. Patient taking differently: Take 20 mg by mouth daily. (May take additional 20mg  tablet if needed for excessive fluid weight) 06/01/21  Yes Jennye Boroughs, MD    Scheduled Meds:  Chlorhexidine Gluconate Cloth  6 each Topical Q0600   feeding supplement  237 mL Oral BID BM   furosemide  60 mg Intravenous Q12H   heparin  5,000 Units Subcutaneous Q8H   hydrocortisone sod succinate (SOLU-CORTEF) inj  100 mg Intravenous Q8H   levothyroxine  25 mcg Oral Q0600   midodrine  10 mg Oral TID WC   multivitamin with minerals  1 tablet Oral Daily   mupirocin ointment   Nasal BID   Continuous Infusions:  sodium chloride Stopped (09/29/21 2200)   norepinephrine (LEVOPHED) Adult infusion Stopped (10/01/21 1453)   PRN Meds:.docusate sodium, polyethylene glycol   Resolved Hospital Problem list     ASSESSMENT & PLAN  Acute Hypoxic Respiratory Failure   Acute on Chronic Systolic CHF,  Pulmonary Edema  Less likely Pneumonia  Chest xray shows bilateral pleural effusion and opacities , right loculated pleural effusion  -No dense lobar infiltrate -Ensure adequate pulmonary hygiene  -F/u cultures, trend PCT remains <0.10 without leukocytosis -BNP >2K, trend -Supplemental O2 as needed to maintain O2 saturations 88 to 92% -Intermittent chest x-ray & ABG PRN -Budesonide inhaler/nebs BID, bronchodilators PRN  Myxedema Coma (  Patient presenting with unresponsiveness, Severe bradycardia requiring external pacing, hypotension on Levophed and severe  hypothermia found to be in severe hypothyroidism) TSH 213, Free T3 0.7, Free T4 <0.25 -s/p IV synthroid 217mcg x 1 -Continued Synthroid 25 mcg daily -Increase to 8mcg today -Trend TSH -Continue stress dose steroids Solucortef 100 mg but decrease to q12h   Acute on Chronic Chronic Systolic HF with Cardiogenic Shock +/- Septic Shock (EF previously <20%% on 05/22) in the setting of severe hypothyroidism PMHx: CAD status post CABG x5, ICM, HLD,  -Recheck TSH, and correct hypothyroid state in setting of heart failure -Reduce, but maintain hydrocortisone at this time  -Continue levophed to maintain MAP > 65, add midodrine in an attempt to wean off Levo -Assess CVP Q 4 h -Trend troponin, lactic -Continue IV Lasix gtt as blood pressure and renal function permits -Hold BP meds in the setting of hypotension  -Repeat 2D Echocardiogram pending -Cardiology consult -Treatment of hypothyroidism -Palliative care consult to assist with [place] discussion with patient and family.   Paroxysmal Atrial Fibrillation -Hold Amiodarone -Not anticoagulated due to hx of significant GI Bleed and Anemia  AKI on CKD Stage III Likely hemodynamically mediated with hypotension (requiring  levophed), Cardiorenal Syndrome, and venous congestion with concomitant ACE, Diurectic and NSAID use Hyponatremia Anemia of Chronic Disease -Monitor I&O's / urinary output -Follow BMP -Ensure adequate renal perfusion -Avoid nephrotoxic agents as able -Replace electrolytes as indicated -Nephrology consult, input appreciated   Best practice (right click and "Reselect all SmartList Selections" daily)  Diet:  Oral Pain/Anxiety/Delirium protocol (if indicated): No VAP protocol (if indicated): Not indicated DVT prophylaxis: Subcutaneous Heparin GI prophylaxis: PPI Glucose control:  SSI No Central venous access:  Yes, and it is still needed Arterial line:  N/A Foley:  Yes, and it is still needed Mobility:  bed rest  PT  consulted: N/A Last date of multidisciplinary goals of care discussion [1/12] Code Status:  full code Disposition: ICU  Critical care time: 35 minutes    //Brigitt Mcclish

## 2021-10-03 ENCOUNTER — Inpatient Hospital Stay
Admit: 2021-10-03 | Discharge: 2021-10-03 | Disposition: A | Discharge status: Home / Self Care | Payer: PPO | Attending: Pulmonary Disease | Admitting: Pulmonary Disease

## 2021-10-03 DIAGNOSIS — N185 Chronic kidney disease, stage 5: Secondary | ICD-10-CM | POA: Diagnosis not present

## 2021-10-03 DIAGNOSIS — I340 Nonrheumatic mitral (valve) insufficiency: Secondary | ICD-10-CM | POA: Diagnosis not present

## 2021-10-03 DIAGNOSIS — R7989 Other specified abnormal findings of blood chemistry: Secondary | ICD-10-CM | POA: Diagnosis not present

## 2021-10-03 DIAGNOSIS — R570 Cardiogenic shock: Secondary | ICD-10-CM | POA: Diagnosis not present

## 2021-10-03 LAB — CULTURE, BLOOD (ROUTINE X 2)
Culture: NO GROWTH
Culture: NO GROWTH
Special Requests: ADEQUATE
Special Requests: ADEQUATE

## 2021-10-03 LAB — ECHOCARDIOGRAM COMPLETE
AR max vel: 0.84 cm2
AV Area VTI: 1.02 cm2
AV Area mean vel: 0.87 cm2
AV Mean grad: 3.3 mmHg
AV Peak grad: 6.1 mmHg
Ao pk vel: 1.24 m/s
Area-P 1/2: 3.61 cm2
Calc EF: 20.7 %
Height: 72 in
MV VTI: 1.23 cm2
S' Lateral: 5.6 cm
Single Plane A2C EF: 26.9 %
Single Plane A4C EF: 14.5 %
Weight: 2656.1 oz

## 2021-10-03 LAB — THYROID PANEL WITH TSH
Free Thyroxine Index: 1.9 (ref 1.2–4.9)
T3 Uptake Ratio: 36 % (ref 24–39)
T4, Total: 5.4 ug/dL (ref 4.5–12.0)
TSH: 88 u[IU]/mL — ABNORMAL HIGH (ref 0.450–4.500)

## 2021-10-03 LAB — BASIC METABOLIC PANEL
Anion gap: 12 (ref 5–15)
BUN: 130 mg/dL — ABNORMAL HIGH (ref 8–23)
CO2: 19 mmol/L — ABNORMAL LOW (ref 22–32)
Calcium: 7.8 mg/dL — ABNORMAL LOW (ref 8.9–10.3)
Chloride: 103 mmol/L (ref 98–111)
Creatinine, Ser: 4.81 mg/dL — ABNORMAL HIGH (ref 0.61–1.24)
GFR, Estimated: 11 mL/min — ABNORMAL LOW (ref >60)
Glucose, Bld: 107 mg/dL — ABNORMAL HIGH (ref 70–99)
Potassium: 4.3 mmol/L (ref 3.5–5.1)
Sodium: 134 mmol/L — ABNORMAL LOW (ref 135–145)

## 2021-10-03 LAB — CBC
HCT: 24.7 % — ABNORMAL LOW (ref 39.0–52.0)
Hemoglobin: 8.3 g/dL — ABNORMAL LOW (ref 13.0–17.0)
MCH: 34 pg (ref 26.0–34.0)
MCHC: 33.6 g/dL (ref 30.0–36.0)
MCV: 101.2 fL — ABNORMAL HIGH (ref 80.0–100.0)
Platelets: 108 10*3/uL — ABNORMAL LOW (ref 150–400)
RBC: 2.44 MIL/uL — ABNORMAL LOW (ref 4.22–5.81)
RDW: 17.7 % — ABNORMAL HIGH (ref 11.5–15.5)
WBC: 6.5 10*3/uL (ref 4.0–10.5)
nRBC: 0.3 % — ABNORMAL HIGH (ref 0.0–0.2)

## 2021-10-03 MED ORDER — FUROSEMIDE 10 MG/ML IJ SOLN
60.0000 mg | Freq: Two times a day (BID) | INTRAMUSCULAR | Status: DC
  Administered 2021-10-03 – 2021-10-07 (×7): 60 mg via INTRAVENOUS
  Filled 2021-10-03 (×7): qty 6

## 2021-10-03 NOTE — Progress Notes (Signed)
[facility]  ROUNDING NOTE   Subjective:   Spoke with [name] on telephone, concerned about confusion Intermittent confusion reported by staff and witnessed by myself later in conversation.   Denies nausea, vomiting. States he hasn't had sense of taste for years  UOP 1270mL.   Creatinine 4.81.   Objective:  Vital signs in last 24 hours:  Temp:  [97 F (36.1 C)-97.7 F (36.5 C)] 97.7 F (36.5 C) (01/17 0800) Pulse Rate:  [57-66] 62 (01/17 0800) Resp:  [11-25] 14 (01/17 0800) BP: (93-107)/(52-73) 106/73 (01/17 0800) SpO2:  [94 %-100 %] 98 % (01/17 0800) FiO2 (%):  [28 %] 28 % (01/16 1800) Weight:  [75.3 kg] 75.3 kg (01/17 0500)  Weight change: 0.5 kg Filed Weights   10/01/21 0119 10/02/21 0412 10/03/21 0500  Weight: 74.4 kg 74.8 kg 75.3 kg    Intake/Output: I/O last 3 completed shifts: In: 1130.7 [P.O.:650; IV Piggyback:480.7] Out: 1810 [Urine:1810]   Intake/Output this shift:  Total I/O In: 0  Out: 125 [Urine:125]  Physical Exam: General: Chronically ill-appearing, sitting up in bed  Head: Normocephalic, atraumatic. Moist oral mucosal membranes  Eyes: Anicteric  Neck: Supple  Lungs:  Basilar rales, normal effort, 2L [place]  Heart: regular  Abdomen:  Soft, nontender, bowel sounds present  Extremities: 1+ lower extremity edema.2+ BUE edema  Neurologic: Awake, alert, following commands  Skin: No acute rash  Access: NONE    Basic Metabolic Panel: Recent Labs  Lab 09/29/21 0520 09/29/21 2017 09/30/21 0549 10/01/21 0411 10/02/21 0411 10/02/21 1730 10/03/21 0523  NA 133*  --  132* 132* 132* 133* 134*  K 4.2   < > 3.9 3.6 4.2 4.0 4.3  CL 104  --  102 102 103 104 103  CO2 18*  --  18* 18* 19* 19* 19*  GLUCOSE 117*  --  121* 112* 90 143* 107*  BUN 127*  --  126* 116* 134* 125* 130*  CREATININE 4.69*  --  4.47* 4.49* 4.99* 4.78* 4.81*  CALCIUM 8.0*  --  8.0* 8.1* 7.9* 7.6* 7.8*  MG 2.4  --  2.4 2.3 2.4  --   --   PHOS 6.6*  --  6.4* 6.4* 7.0*  --    --    < > = values in this interval not displayed.     Liver Function Tests: Recent Labs  Lab 09/28/21 2147 10/01/21 0411  AST 33  --   ALT 20  --   ALKPHOS 63  --   BILITOT 1.0  --   PROT 6.6  --   ALBUMIN 3.2* 3.0*    No results for input(s): LIPASE, AMYLASE in the last 168 hours. No results for input(s): AMMONIA in the last 168 hours.  CBC: Recent Labs  Lab 09/28/21 2015 09/29/21 0520 09/30/21 0549 10/01/21 0411 10/02/21 0411 10/03/21 0523  WBC 4.9 5.1 6.1 6.9 6.5 6.5  NEUTROABS 2.9  --   --   --   --   --   HGB 8.7* 8.9* 9.4* 9.0* 8.0* 8.3*  HCT 26.4* 26.7* 27.2* 26.2* 22.8* 24.7*  MCV 105.6* 101.5* 100.0 101.2* 100.9* 101.2*  PLT 134* 144* 141* 143* 109* 108*     Cardiac Enzymes: No results for input(s): CKTOTAL, CKMB, CKMBINDEX, TROPONINI in the last 168 hours.  BNP: Invalid input(s): POCBNP  CBG: Recent Labs  Lab 09/29/21 0231  IOMBTD 97     Microbiology: Results for orders placed or performed during the hospital encounter of 09/28/21  Resp  Panel by RT-PCR (Flu A&B, Covid) Nasopharyngeal Swab     Status: None   Collection Time: 09/28/21  8:19 PM   Specimen: Nasopharyngeal Swab; Nasopharyngeal(NP) swabs in vial transport medium  Result Value Ref Range Status   SARS Coronavirus 2 by RT PCR NEGATIVE NEGATIVE Final    Comment: (NOTE) SARS-CoV-2 target nucleic acids are NOT DETECTED.  The SARS-CoV-2 RNA is generally detectable in upper respiratory specimens during the acute phase of infection. The lowest concentration of SARS-CoV-2 viral copies this assay can detect is 138 copies/mL. A negative result does not preclude SARS-Cov-2 infection and should not be used as the sole basis for treatment or other patient management decisions. A negative result may occur with  improper specimen collection/handling, submission of specimen other than nasopharyngeal swab, presence of viral mutation(s) within the areas targeted by this assay, and inadequate  number of viral copies(<138 copies/mL). A negative result must be combined with clinical observations, patient history, and epidemiological information. The expected result is Negative.  Fact Sheet for Patients:  EntrepreneurPulse.com.au  Fact Sheet for Healthcare Providers:  IncredibleEmployment.be  This test is no t yet approved or cleared by the [place] FDA and  has been authorized for detection and/or diagnosis of SARS-CoV-2 by FDA under an Emergency Use Authorization (EUA). This EUA will remain  in effect (meaning this test can be used) for the duration of the COVID-19 declaration under Section 564(b)(1) of the Act, 21 U.S.C.section 360bbb-3(b)(1), unless the authorization is terminated  or revoked sooner.       Influenza A by PCR NEGATIVE NEGATIVE Final   Influenza B by PCR NEGATIVE NEGATIVE Final    Comment: (NOTE) The Xpert Xpress SARS-CoV-2/FLU/RSV plus assay is intended as an aid in the diagnosis of influenza from Nasopharyngeal swab specimens and should not be used as a sole basis for treatment. Nasal washings and aspirates are unacceptable for Xpert Xpress SARS-CoV-2/FLU/RSV testing.  Fact Sheet for Patients: EntrepreneurPulse.com.au  Fact Sheet for Healthcare Providers: IncredibleEmployment.be  This test is not yet approved or cleared by the [place] FDA and has been authorized for detection and/or diagnosis of SARS-CoV-2 by FDA under an Emergency Use Authorization (EUA). This EUA will remain in effect (meaning this test can be used) for the duration of the COVID-19 declaration under Section 564(b)(1) of the Act, 21 U.S.C. section 360bbb-3(b)(1), unless the authorization is terminated or revoked.  Performed at [facility], [address] [place], [place] [address]   Culture, blood (routine x 2)     Status: None   Collection Time: 09/28/21  9:47 PM   Specimen:  BLOOD  Result Value Ref Range Status   Specimen Description BLOOD LEFT FOREARM  Final   Special Requests   Final    BOTTLES DRAWN AEROBIC AND ANAEROBIC Blood Culture adequate volume   Culture   Final    NO GROWTH 5 DAYS Performed at [facility], [address], [place], [place] [address]    Report Status 10/03/2021 FINAL  Final  Culture, blood (routine x 2)     Status: None   Collection Time: 09/28/21  9:47 PM   Specimen: BLOOD  Result Value Ref Range Status   Specimen Description BLOOD LEFT FOREARM  Final   Special Requests IN PEDIATRIC BOTTLE Blood Culture adequate volume  Final   Culture   Final    NO GROWTH 5 DAYS Performed at [facility], [address], [place], [place] [address]    Report Status 10/03/2021 FINAL  Final  MRSA Next Gen by PCR, Nasal     Status: Abnormal   Collection Time: 09/29/21  2:38 AM   Specimen: Nasal Mucosa; Nasal Swab  Result Value Ref Range Status   MRSA by PCR Next Gen DETECTED (A) NOT DETECTED Final    Comment: RESULT CALLED TO, READ BACK BY AND VERIFIED WITH: Hansel Starling @0353  on 09/29/21 SKL (NOTE) The GeneXpert MRSA Assay (FDA approved for NASAL specimens only), is one component of a comprehensive MRSA colonization surveillance program. It is not intended to diagnose MRSA infection nor to guide or monitor treatment for MRSA infections. Test performance is not FDA approved in patients less than 66 years old. Performed at [facility], [address] [place], [place] [id number]     Coagulation Studies: No results for input(s): LABPROT, INR in the last 72 hours.   Urinalysis: No results for input(s): COLORURINE, LABSPEC, PHURINE, GLUCOSEU, HGBUR, BILIRUBINUR, KETONESUR, PROTEINUR, UROBILINOGEN, NITRITE, LEUKOCYTESUR in the last 72 hours.  Invalid input(s): APPERANCEUR     Imaging: No results found.   Medications:    sodium chloride Stopped (09/29/21 2200)   norepinephrine (LEVOPHED) Adult  infusion Stopped (10/01/21 1453)    Chlorhexidine Gluconate Cloth  6 each Topical Q0600   feeding supplement  237 mL Oral BID BM   heparin  5,000 Units Subcutaneous Q8H   hydrocortisone sod succinate (SOLU-CORTEF) inj  100 mg Intravenous Q12H   levothyroxine  75 mcg Oral Q0600   midodrine  10 mg Oral TID WC   multivitamin with minerals  1 tablet Oral Daily   mupirocin ointment   Nasal BID   docusate sodium, polyethylene glycol  Assessment/ Plan:   [name] is a 88 y.o. white male with hypertension, coronary artery disease, aortic valve replacement, osteoarthritis, diverticulosis, ruptured abdominal viscus, history of hyponatremia, history of hiatal hernia, abdominal aortic aneurysm, constipation, right hip fracture status post repair, chronic systolic heart failure who presents to [facility] on 09/28/2021 for Cardiogenic shock ([place]) [R57.0] Anasarca [R60.1] Uremia [N19] Junctional bradycardia [R00.1] Acute on chronic systolic congestive heart failure (HCC) [I50.23] Chronic kidney disease, unspecified CKD stage [N18.9]  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 Started on IV levothyroxine and now on levothyroxine 25mg  daily. Also on stress dose steroids.   1. Acute kidney injury on Chronic kidney disease stage V: baseline creatinine of 3.9 on 09/04/21. Patient with chronic kidney disease secondary to hypertension and vascular disease. Acute kidney injury secondary to acute exacerbation of chronic systolic congestive heart failure: Family was interested in peritoneal dialysis prior to admission. Needed cardiology clearance before proceeding with PD catheter placement.   - holding losartan due to hypotension and acute kidney injury -Uremic symptoms of confusion, poor appetite, drowsy - Discussed with patient and family that dialysis may be required to clear these symptoms. Will discuss with cardiology about this and stability of patient to place a HD cath. Patient is informed that it  is his decision to proceed with dialysis and unable to verbalize decision during visit. Will follow up with patient after we confer with team of treatment plan.  - Will continue Lasix at this time due to edema - Continue to monitor volume status, urine output and renal function.  - After discussing with patient and daughter in law at bedside, family has multiple health conditions and may not be available to assist with PD at this time. We will continue to monitor this situation.   2. Hypotension: with acute exacerbation of systolic  congestive heart failure: echo from 01/31/21 with Ejection Fraction of <20%. Cardiogenic shock. Holding home regimen of losartan and PO torsemide.   - Follow up ECHO pending - weaned off vasopressors - IV furosemide continued  3. Anemia with chronic kidney disease: hemoglobin 8.3.Slowly recovering. Will continue to monitor  4. Hyponatremia: secondary to chronic kidney disease and congestive heart failure.  - Continue Furosemide  5. Secondary Hyperparathyroidism: PTH 110 on 08/31/21. Holding calcitriol.     LOS: 5 Clarissa Laird 1/17/20239:06 AM

## 2021-10-03 NOTE — Progress Notes (Signed)
[facility] for Electrolyte Monitoring and Replacement   Recent Labs: Potassium (mmol/L)  Date Value  10/03/2021 4.3  12/09/2014 4.7   Magnesium (mg/dL)  Date Value  10/02/2021 2.4   Calcium (mg/dL)  Date Value  10/03/2021 7.8 (L)   Calcium, Total (mg/dL)  Date Value  12/09/2014 8.5 (L)   Albumin (g/dL)  Date Value  10/01/2021 3.0 (L)   Phosphorus (mg/dL)  Date Value  10/02/2021 7.0 (H)   Sodium (mmol/L)  Date Value  10/03/2021 134 (L)  12/09/2014 126 (L)   Assessment: 88 year old male presented with bradycardia PTA, initially being paced on arrival. Labs revealed significantly elevated TSH and low T4. Patient followed by Nephrology outpatient, awaiting PD catheter placement. Cardiology also consulted, patient with HFrEF. Patient was started on Lasix infusion for volume overload but currently on 60 mg IV q12h. He remains on low-dose norepinephrine. Pharmacy consult to follow and replace electrolytes.   Diuretics: On hold given worsening SCr  Goal of Therapy:  Electrolytes within normal limits  Plan:  No electrolyte replacement indicated at this time; Scr slightly improved with diuretics on hold & albumin yesterday Follow-up electrolytes with AM labs tomorrow  Benita Gutter 10/03/2021 7:40 AM

## 2021-10-03 NOTE — Progress Notes (Signed)
°   10/03/21 1300  Clinical Encounter Type  Visited With Patient  Visit Type Follow-up  Spiritual Encounters  Spiritual Needs Emotional;Grief support   Chaplain found the patient a little discouraged about his progress. Patient was eating lunch and so Chaplain didn't stay long but reassured patient that he would be praying for him. This word seemed comforting to the patient.

## 2021-10-03 NOTE — Progress Notes (Signed)
CARDIOLOGY CONSULT NOTE               Patient ID: [name] MRN: [id number] DOB/AGE: 1931-05-19 88 y.o.  Admit date: 09/28/2021 Referring Physician Rufina Falco, NP Primary Physician Dr. Harrel Lemon Primary Cardiologist Dr. Nehemiah Massed Reason for Consultation heart failure, bradycardia  HPI 88 year old male with a past medical history significant for CAD s/p CABG x5 in 2011, HFrEF (LVEF <20%), ischemic cardiomyopathy, CKD stage V, paroxysmal atrial fibrillation, severe AS s/p bioprosthetic valve replacement 2011, AAA (3.2 cm in 2020), history of MGUS, who presented to [facility] ED 09/28/2021 after being found minimally responsive in a chair at home with reported vital signs of heart rate in the 20s and blood pressure 80/60 by EMS.  Cardiology was consulted for assistance with his heart failure exacerbation in setting of myxedema coma.   Interval history: -very somnolent and unable to be kept awake long enough for interview -his renal function is worsening, not responding to IV diuretics.  -goals of care discussion with patient and family regarding next steps is important  Past Medical History:  Diagnosis Date   Anemia    Aortic aneurysm (HCC)    Aortic aneurysm (HCC)    Arthritis    CHF (congestive heart failure) (HCC)    CKD (chronic kidney disease)    ALSO LEFT RENAL MASS   Coronary artery disease    Diverticulosis    History of perforated diverticulitis   GERD (gastroesophageal reflux disease)    History of hiatal hernia    Hypercholesteremia    Hypertension    MGUS (monoclonal gammopathy of unknown significance)    MGUS (monoclonal gammopathy of unknown significance)     Past Surgical History:  Procedure Laterality Date   AORTIC VALVE REPLACEMENT     bipass     CARDIAC VALVE REPLACEMENT     pig valve   CHOLECYSTECTOMY     Open   COLON SURGERY     History of Hartman's procedure with subsequent colostomy reversal   CORONARY ANGIOPLASTY     CORONARY ARTERY  BYPASS GRAFT     ESOPHAGOGASTRODUODENOSCOPY (EGD) WITH PROPOFOL N/A 02/07/2015   Procedure: ESOPHAGOGASTRODUODENOSCOPY (EGD) WITH PROPOFOL;  Surgeon: Hulen Luster, MD;  Location: [facility];  Service: Gastroenterology;  Laterality: N/A;   EYE SURGERY     HERNIA REPAIR     INTRAMEDULLARY (IM) NAIL INTERTROCHANTERIC Right 05/12/2020   Procedure: INTRAMEDULLARY (IM) NAIL INTERTROCHANTRIC;  Surgeon: Lovell Sheehan, MD;  Location: [facility] ORS;  Service: Orthopedics;  Laterality: Right;   JOINT REPLACEMENT     left knee x4   KNEE ARTHROPLASTY Right 04/02/2016   Procedure: COMPUTER ASSISTED TOTAL KNEE ARTHROPLASTY;  Surgeon: Dereck Leep, MD;  Location: [facility] ORS;  Service: Orthopedics;  Laterality: Right;   ORIF FEMUR FRACTURE Right 08/08/2020   Procedure: OPEN REDUCTION INTERNAL FIXATION (ORIF) DISTAL FEMUR FRACTURE;  Surgeon: Lovell Sheehan, MD;  Location: [facility] ORS;  Service: Orthopedics;  Laterality: Right;    Medications Prior to Admission  Medication Sig Dispense Refill Last Dose   acetaminophen (TYLENOL) 500 MG tablet Take 1,000 mg by mouth every 6 (six) hours as needed for mild pain.   Unknown at PRN   albuterol (VENTOLIN HFA) 108 (90 Base) MCG/ACT inhaler Inhale 1-2 puffs into the lungs every 6 (six) hours as needed for wheezing or shortness of breath.   Unknown at PRN   amiodarone (PACERONE) 200 MG tablet Take 1 tablet (200 mg total) by mouth daily. 60 tablet 1  09/28/2021 at 0800   aspirin EC 81 MG tablet Take 1 tablet (81 mg total) by mouth 2 (two) times daily. Swallow whole. 30 tablet 0 09/28/2021 at 0800   calcitRIOL (ROCALTROL) 0.25 MCG capsule Take 0.25 mcg by mouth daily.   09/28/2021 at 0800   losartan (COZAAR) 25 MG tablet Take 1 tablet (25 mg total) by mouth daily. (Patient taking differently: Take 12.5 mg by mouth daily.) 30 tablet 1 09/28/2021 at 0800   pravastatin (PRAVACHOL) 20 MG tablet Take 20 mg by mouth every evening.   09/27/2021 at 2000   torsemide (DEMADEX) 20 MG tablet Take  1 tablet (20 mg total) by mouth daily. (Patient taking differently: Take 20 mg by mouth daily. (May take additional 20mg  tablet if needed for excessive fluid weight)) 60 tablet 1 09/28/2021 at 0800   Social History   Socioeconomic History   Marital status: Widowed    Spouse name: Not on file   Number of children: Not on file   Years of education: Not on file   Highest education level: Not on file  Occupational History   Not on file  Tobacco Use   Smoking status: Former   Smokeless tobacco: Never  Vaping Use   Vaping Use: Never used  Substance and Sexual Activity   Alcohol use: No   Drug use: No   Sexual activity: Not on file  Other Topics Concern   Not on file  Social History Narrative   Not on file   Social Determinants of Health   Financial Resource Strain: Not on file  Food Insecurity: Not on file  Transportation Needs: Not on file  Physical Activity: Not on file  Stress: Not on file  Social Connections: Not on file  Intimate Partner Violence: Not on file    Family History  Problem Relation Age of Onset   Hypertension Mother    Heart failure Mother    Heart failure Father    Bladder Cancer Neg Hx    Prostate cancer Neg Hx    Kidney cancer Neg Hx     Review of systems complete and found to be negative unless listed above  PHYSICAL EXAM General: Pleasant elderly and frail appearing Caucasian male, well nourished, in no acute distress. Laying in ICU bed at incline, sleeping.  HEENT:  Normocephalic and atraumatic. Neck:  No JVD.  Lungs: Normal respiratory effort on 2 L by nasal cannula.  Clear to auscultation anteriorly Heart: HRRR . Normal S1 and S2. Radial pulses 2+ bilaterally. Abdomen: Non-distended appearing.  Msk: Normal strength and tone for age. Extremities: No clubbing, cyanosis.  Trace bilateral pedal edema and associated chronic venous stasis changes in bilateral lower extremities.  Ecchymosis on dorsum of left hand.  Neuro: somnolent, unable to  assess Psych:  unable to assess  Labs:   Lab Results  Component Value Date   WBC 6.5 10/03/2021   HGB 8.3 (L) 10/03/2021   HCT 24.7 (L) 10/03/2021   MCV 101.2 (H) 10/03/2021   PLT 108 (L) 10/03/2021    Recent Labs  Lab 09/28/21 2147 09/29/21 0520 10/03/21 0523  NA 130*   < > 134*  K 4.3   < > 4.3  CL 102   < > 103  CO2 16*   < > 19*  BUN 133*   < > 130*  CREATININE 4.69*   < > 4.81*  CALCIUM 7.9*   < > 7.8*  PROT 6.6  --   --   BILITOT 1.0  --   --  ALKPHOS 63  --   --   ALT 20  --   --   AST 33  --   --   GLUCOSE 102*   < > 107*   < > = values in this interval not displayed.    No results found for: CKTOTAL, CKMB, CKMBINDEX, TROPONINI No results found for: CHOL No results found for: HDL No results found for: LDLCALC No results found for: TRIG No results found for: CHOLHDL No results found for: LDLDIRECT    Radiology: CT Head Wo Contrast  Result Date: 09/28/2021 CLINICAL DATA:  Altered mental status.  Unresponsive. EXAM: CT HEAD WITHOUT CONTRAST TECHNIQUE: Contiguous axial images were obtained from the base of the skull through the vertex without intravenous contrast. RADIATION DOSE REDUCTION: This exam was performed according to the departmental dose-optimization program which includes automated exposure control, adjustment of the mA and/or kV according to patient size and/or use of iterative reconstruction technique. COMPARISON:  05/11/2020 FINDINGS: Brain: Stable degree of atrophy and chronic small vessel ischemia. No intracranial hemorrhage, mass effect, or midline shift. No hydrocephalus. The basilar cisterns are patent. No evidence of territorial infarct or acute ischemia. No extra-axial or intracranial fluid collection. Vascular: Atherosclerosis of skullbase vasculature without hyperdense vessel or abnormal calcification. Patchy air in the cavernous sinuses, typically related to IV catheter placement. Skull: No fracture or focal lesion. Sinuses/Orbits: No evidence  of acute fracture. No paranasal sinus inflammation. Bilateral cataract resection. Mastoid air cells are clear. Other: There is air within the subcutaneous soft tissues about the facial bones and temporal as musculature. This is typically related to IV catheter placement. IMPRESSION: 1. No acute intracranial abnormality. 2. Stable atrophy and chronic small vessel ischemia. 3. Patchy air in the cavernous sinuses and periorbital soft tissues, typically related to IV catheter placement. Electronically Signed   By: Keith Rake M.D.   On: 09/28/2021 21:06   DG Chest Port 1 View  Result Date: 09/30/2021 CLINICAL DATA:  Encounter for central line placement EXAM: PORTABLE CHEST 1 VIEW COMPARISON:  Chest x-ray 09/30/2021 5:47 p.m., CT chest 05/19/2021, chest x-ray 09/29/2021 FINDINGS: Interval advancement of right internal jugular central venous catheter with tip overlying the expected region of the superior caval junction. Lines and tubes overlie the patient. Stable enlarged cardiac silhouette. The heart and mediastinal contours are unchanged. Aortic calcification prominent hilar vasculature. Patchy airspace opacities within the right lower lobe. Limited evaluation left lower lobe due to overlying cardiac paddle. Increased interstitial markings. Bilateral trace to small volume pleural effusion. No pneumothorax. No acute osseous abnormality. IMPRESSION: 1. Interval advancement of right internal jugular central venous catheter with tip overlying the expected region of the superior caval junction. 2. Pulmonary edema. 3. Bilateral trace to small volume pleural effusion. 4. Patchy right lower lobe opacities suggestive of infection/inflammation. Followup PA and lateral chest X-ray is recommended in 3-4 weeks following therapy to ensure resolution and exclude underlying malignancy. Electronically Signed   By: Iven Finn M.D.   On: 09/30/2021 19:12   DG Chest Port 1 View  Result Date: 09/30/2021 CLINICAL DATA:   Central line placement. Clinical concern for central lying being pulled back. EXAM: PORTABLE CHEST 1 VIEW COMPARISON:  09/29/2021. FINDINGS: Right internal jugular central venous line now has its tip projecting over the right apex consistent with placement in the internal jugular vein superior to its confluence with the subclavian vein. No pneumothorax. Interstitial and hazy airspace lung opacities are noted that are without significant change from the prior exam. Small  bilateral pleural effusions are stable. IMPRESSION: 1. Retracted right internal jugular central venous line as detailed. 2. No other change from the prior chest radiograph. Electronically Signed   By: Lajean Manes M.D.   On: 09/30/2021 18:05   DG Chest Port 1 View  Result Date: 09/29/2021 CLINICAL DATA:  Encounter for central line placement EXAM: PORTABLE CHEST 1 VIEW COMPARISON:  Yesterday FINDINGS: New right IJ line with tip at the upper cavoatrial junction. Cardiomegaly. Prior CABG and aortic valve replacement. Pleural effusions and pulmonary opacity which are unchanged. No visible pneumothorax. IMPRESSION: 1. Right IJ line without complicating feature. 2. Stable pleural effusions and pulmonary opacity. Electronically Signed   By: Jorje Guild M.D.   On: 09/29/2021 04:42   DG Chest Portable 1 View  Result Date: 09/28/2021 CLINICAL DATA:  Altered mental status. EXAM: PORTABLE CHEST 1 VIEW COMPARISON:  07/28/2021 FINDINGS: Post median sternotomy with prosthetic cardiac valve. The heart is enlarged. Bilateral pleural effusions and bibasilar opacities, mildly increased. Right pleural effusion is likely loculated tracking towards the apex. Chronic interstitial coarsening. No pneumothorax. IMPRESSION: Cardiomegaly with bilateral pleural effusions and bibasilar opacities, mildly increased from prior radiograph. Chronic interstitial coarsening. Electronically Signed   By: Keith Rake M.D.   On: 09/28/2021 20:58    ECHO 10/03/2021  1.  Left ventricular ejection fraction, by estimation, is <20%. The left  ventricle has severely decreased function. The left ventricle demonstrates  global hypokinesis. The left ventricular internal cavity size was severely  dilated. Left ventricular  diastolic parameters were normal.   2. Right ventricular systolic function is mildly reduced. The right  ventricular size is moderately enlarged.   3. Left atrial size was moderately dilated.   4. Right atrial size was moderately dilated.   5. The mitral valve is myxomatous. Moderate to severe mitral valve  regurgitation.   6. Tricuspid valve regurgitation is moderate to severe.   7. The aortic valve has been repaired/replaced. Aortic valve  regurgitation is trivial. There is a bioprosthetic valve present in the  aortic position. Echo findings are consistent with normal structure and  function of the aortic valve prosthesis.   TELEMETRY reviewed by me: Junctional rhythm rate of 56  EKG reviewed by me: Junctional rhythm rate of 52  ASSESSMENT AND PLAN:  29-year-old male with a past medical history significant for CAD s/p CABG x5 in 2011, HFrEF (LVEF <20%), ischemic cardiomyopathy, CKD stage IV, paroxysmal atrial fibrillation, severe AS s/p bioprosthetic valve replacement 2011, AAA (3.2 cm in 2020), history of MGUS, who presented to [facility] ED 09/28/2021 after being found minimally responsive in a chair at home with reported vital signs of temperature 93 F, heart rate in the 20s and blood pressure 80/60 by EMS.  Cardiology was consulted for assistance with his heart failure exacerbation in setting of myxedema coma.   #Severe hypothyroidism / myxedema coma ?History of thyroid disorder. Patient presented hypothermic, mildly hyponatremic, bradycardic and hypotensive.  TSH > 200.  -Management per primary team. Likely driving shock state.   #Acute on chronic HFrEF (LVEF less than 20% 01/2021)  #Ischemic cardiomyopathy #Stage V CKD #Macrocytic anemia -  Levophed gtt. stopped the afternoon of 1/15.  Remains on midodirine. - lasix infusion discontinued 1/15, now failing lasix IV and renal function is worsening.  -Goals of care discussions are prudent.  -Echocardiogram revealed LVEF <20% and global hypokinesis -Home medications on hold for hypotension. losartan 12.5 mg once daily, torsemide 20 mg once daily with as needed additional 20 mg if  he gained weight.  We will escalate GDMT as appropriate in patient's clinical course.  #Acute hypoxic respiratory failure supportive care per primary team, on 2 L of oxygen at baseline.   #CAD s/p CABGx5 2011 #Hyperlipidemia Continue pravastatin 20 milligrams and aspirin  #Paroxysmal atrial fibrillation Continue to hold amiodarone. On 200 mg once daily at home, not anticoagulated due to patient preference and concerns of significant bleeding and his known anemia.  This patient's case was discussed with Dr. Serafina Royals and he is in agreement.   Signed: Tristan Schroeder , PA-C 10/03/2021, 12:11 PM

## 2021-10-03 NOTE — Progress Notes (Signed)
*  PRELIMINARY RESULTS* Echocardiogram 2D Echocardiogram has been performed.  [name] 10/03/2021, 7:52 AM

## 2021-10-03 NOTE — Progress Notes (Signed)
NAME:  [name], MRN:  [id number], DOB:  02-19-1931, LOS: 5 ADMISSION DATE:  09/28/2021, INITIAL CONSULTATION DATE:  09/29/2021 REFERRING MD:  Blake Divine, MD  CHIEF COMPLAINT:  Unresponsiveness   Brief Patient Description  74 yr. male who presented to the ED with unresponsiveness. Per ED reports, patient was found in a chair at his home with heart rate in the 20s and BP was 80/60.  EMS initiated pacing at 70 bpm and 110 mA following administration of 2 mg of IV Versed.  He was also started on Levophed drip for hypotension and transported to the ED.  ED Course: On arrival to the ED, he was initially somnolent but arousable to voice and complaining of chest pain where he is being shocked.  Pacing was stopped and patient was noted to have a heart rate in the 50s consistent with junctional bradycardia.  He was hypothermic 94.5 F (34.7 C) with blood pressure 81/51 mm Hg and pulse rate 51 beats/min., RR 21 and oxygen saturation of 100% on 15 L. Chest X-ray>Cardiomegaly with bilateral pleural effusions and bibasilar Opacities. Pertinent Labs in Findings: Na+/K+:130/4.3, Glucose: 120, BUN/Cr:133/4.69, Calcium: 7.9, WBC:4.9, Hgb/Hct:8.7/26.4, PCT: Negative<0.10, Lactic acid: 1.8, COVID PCR: Negative, Troponin: 40, BNP: 2137, TSH: 213. Given worsening hypotension and suspected cardiogenic shock, patient started on Levophed. PCCM consulted.  Pertinent  Medical History  CKD stage IV, hypertension, proteinuria, hyperparathyroidism due to renal insufficiency, hyperlipidemia, aspiration pneumonia, Systolic HF EF of 03-50%, ischemic cardiomyopathy, bilateral carotid stenosis, severe aortic stenosis, AAA, MGUS, paroxysmal SVT, CABG status post LIMA to LAD, SVG to OM1, OM2, D1, PDA, status post bioprosthetic valve replacement in 2011  [facility] [facility] Events: Including procedures, antibiotic start and stop dates in addition to other pertinent events   1/13: Admitted to ICU with Acute on Chronic CHF  exacerbation in the setting of severe hypothyroidism 1/14: Started on Lasix gtt 1/15: Remains on Lasix gtt and Levo  Cultures:  1/12: SARS-CoV-2 PCR> negative 1/12: Influenza PCR> negative 1/12: Blood culture x2> 1/12: Urine Culture> 1/12: MRSA PCR>> POSITIVE 1/12: Strep pneumo urinary antigen> 1/12: Legionella urinary antigen> Antimicrobials:  Vancomycin 1/13>STOPPED Cefazolin 1/13> STOPPED  Interim History / Subjective:  -No significant events overnight, remains on Levo and Lasix gtt -Urine output adequate 2231ml/24 hour  OBJECTIVE   Blood pressure 101/61, pulse 63, temperature 97.7 F (36.5 C), temperature source Bladder, resp. rate 14, height 6' (1.829 m), weight 75.3 kg, SpO2 98 %.    FiO2 (%):  [28 %] 28 %   Intake/Output Summary (Last 24 hours) at 10/03/2021 1713 Last data filed at 10/03/2021 1600 Gross per 24 hour  Intake 520 ml  Output 1058 ml  Net -538 ml    Filed Weights   10/01/21 0119 10/02/21 0412 10/03/21 0500  Weight: 74.4 kg 74.8 kg 75.3 kg    Physical Examination: GENERAL: 88 year-old critically ill patient lying in the bed with no acute distress.  EYES: Pupils equal, round, reactive to light and accommodation. No scleral icterus. Extraocular muscles intact.  HEENT: Head atraumatic, normocephalic. Oropharynx and nasopharynx clear.  NECK:  Supple, no jugular venous distention. No thyroid enlargement, no tenderness.  LUNGS: Decreased breath sounds bilaterally, no wheezing, mild to moderate rales, crackles and rhonchi. No use of accessory muscles of respiration.  CARDIOVASCULAR: S1, S2 normal. No murmurs, rubs, or gallops.  ABDOMEN: Soft, nontender, nondistended. Bowel sounds present. No organomegaly or mass.  EXTREMITIES: +1 pitting edema of lower extremities, cyanosis, or clubbing.  NEUROLOGIC: Cranial nerves II  through XII are intact.  Muscle strength 3/5 in all extremities. Sensation intact. Gait not checked.  PSYCHIATRIC: The patient is alert and  oriented x 3.  SKIN: No obvious rash, lesion, or ulcer.   Labs/imaging that I havepersonally reviewed  (right click and "Reselect all SmartList Selections" daily)    Labs   CBC: Recent Labs  Lab 09/28/21 2015 09/29/21 0520 09/30/21 0549 10/01/21 0411 10/02/21 0411 10/03/21 0523  WBC 4.9 5.1 6.1 6.9 6.5 6.5  NEUTROABS 2.9  --   --   --   --   --   HGB 8.7* 8.9* 9.4* 9.0* 8.0* 8.3*  HCT 26.4* 26.7* 27.2* 26.2* 22.8* 24.7*  MCV 105.6* 101.5* 100.0 101.2* 100.9* 101.2*  PLT 134* 144* 141* 143* 109* 108*     Basic Metabolic Panel: Recent Labs  Lab 09/29/21 0520 09/29/21 2017 09/30/21 0549 10/01/21 0411 10/02/21 0411 10/02/21 1730 10/03/21 0523  NA 133*  --  132* 132* 132* 133* 134*  K 4.2   < > 3.9 3.6 4.2 4.0 4.3  CL 104  --  102 102 103 104 103  CO2 18*  --  18* 18* 19* 19* 19*  GLUCOSE 117*  --  121* 112* 90 143* 107*  BUN 127*  --  126* 116* 134* 125* 130*  CREATININE 4.69*  --  4.47* 4.49* 4.99* 4.78* 4.81*  CALCIUM 8.0*  --  8.0* 8.1* 7.9* 7.6* 7.8*  MG 2.4  --  2.4 2.3 2.4  --   --   PHOS 6.6*  --  6.4* 6.4* 7.0*  --   --    < > = values in this interval not displayed.    GFR: Estimated Creatinine Clearance: 11.3 mL/min (A) (by C-G formula based on SCr of 4.81 mg/dL (H)). Recent Labs  Lab 09/29/21 0006 09/29/21 0520 09/29/21 0856 09/29/21 1105 09/30/21 0549 10/01/21 0411 10/02/21 0411 10/03/21 0523  PROCALCITON <0.10  --   --   --   --   --   --   --   WBC  --  5.1  --   --  6.1 6.9 6.5 6.5  LATICACIDVEN 1.0 0.8 0.9 0.9  --   --   --   --      Liver Function Tests: Recent Labs  Lab 09/28/21 2147 10/01/21 0411  AST 33  --   ALT 20  --   ALKPHOS 63  --   BILITOT 1.0  --   PROT 6.6  --   ALBUMIN 3.2* 3.0*    No results for input(s): LIPASE, AMYLASE in the last 168 hours. No results for input(s): AMMONIA in the last 168 hours.  ABG    Component Value Date/Time   PHART 7.377 02/23/2010 2118   PCO2ART 39.5 02/23/2010 2118   PO2ART  124.0 (H) 02/23/2010 2118   HCO3 17.0 (L) 09/29/2021 0853   TCO2 23 02/23/2010 2217   ACIDBASEDEF 9.2 (H) 09/29/2021 0853   O2SAT 57.5 09/29/2021 0853      Coagulation Profile: Recent Labs  Lab 09/29/21 0115  INR 1.3*     Cardiac Enzymes: No results for input(s): CKTOTAL, CKMB, CKMBINDEX, TROPONINI in the last 168 hours.  HbA1C: Hgb A1c MFr Bld  Date/Time Value Ref Range Status  02/23/2010 05:34 AM  <5.7 % Final   5.4 (NOTE)  According to the ADA Clinical Practice Recommendations for 2011, when HbA1c is used as a screening test:   >=6.5%   Diagnostic of Diabetes Mellitus           (if abnormal result  is confirmed)  5.7-6.4%   Increased risk of developing Diabetes Mellitus  References:Diagnosis and Classification of Diabetes Mellitus,Diabetes WUXL,2440,10(UVOZD 1):S62-S69 and Standards of Medical Care in         Diabetes - 2011,Diabetes [id number],42  (Suppl 1):S11-S61.    CBG: Recent Labs  Lab 09/29/21 0231  GLUCAP 97     Allergies Allergies  Allergen Reactions   Morphine Anaphylaxis   Oxycodone Other (See Comments)    Hypotension and bradycardia   Carvedilol     Dizziness and syncope    Cefuroxime Axetil Swelling   Iodinated Contrast Media Rash    Other reaction(s): Asthenia (finding), Other (qualifier value) paralyzed legs Other reaction(s): RASH    Lisinopril Cough and Other (See Comments)     Home Medications  Prior to Admission medications   Medication Sig Start Date End Date Taking? Authorizing Provider  acetaminophen (TYLENOL) 500 MG tablet Take 1,000 mg by mouth every 6 (six) hours as needed for mild pain.   Yes [provider]  albuterol (VENTOLIN HFA) 108 (90 Base) MCG/ACT inhaler Inhale 1-2 puffs into the lungs every 6 (six) hours as needed for wheezing or shortness of breath. 01/16/21 01/16/22 Yes [provider]  amiodarone (PACERONE) 200 MG tablet Take 1 tablet  (200 mg total) by mouth daily. 06/01/21  Yes Jennye Boroughs, MD  aspirin EC 81 MG tablet Take 1 tablet (81 mg total) by mouth 2 (two) times daily. Swallow whole. 08/10/20  Yes Donne Hazel, MD  calcitRIOL (ROCALTROL) 0.25 MCG capsule Take 0.25 mcg by mouth daily. 07/03/21  Yes [provider]  losartan (COZAAR) 25 MG tablet Take 1 tablet (25 mg total) by mouth daily. Patient taking differently: Take 12.5 mg by mouth daily. 02/08/21  Yes Wouk, Ailene Rud, MD  pravastatin (PRAVACHOL) 20 MG tablet Take 20 mg by mouth every evening.   Yes [provider]  torsemide (DEMADEX) 20 MG tablet Take 1 tablet (20 mg total) by mouth daily. Patient taking differently: Take 20 mg by mouth daily. (May take additional 20mg  tablet if needed for excessive fluid weight) 06/01/21  Yes Jennye Boroughs, MD    Scheduled Meds:  Chlorhexidine Gluconate Cloth  6 each Topical Q0600   feeding supplement  237 mL Oral BID BM   furosemide  60 mg Intravenous Q12H   heparin  5,000 Units Subcutaneous Q8H   levothyroxine  75 mcg Oral Q0600   midodrine  10 mg Oral TID WC   multivitamin with minerals  1 tablet Oral Daily   mupirocin ointment   Nasal BID   Continuous Infusions:  sodium chloride Stopped (09/29/21 2200)   PRN Meds:.docusate sodium, polyethylene glycol   Resolved Hospital Problem list     ASSESSMENT & PLAN  Acute Hypoxic Respiratory Failure   Acute on Chronic Systolic CHF,  Pulmonary Edema  Less likely Pneumonia  Chest xray shows bilateral pleural effusion and opacities , right loculated pleural effusion -No dense lobar infiltrate -Ensure adequate pulmonary hygiene  -F/u cultures, trend PCT remains <0.10 without leukocytosis -BNP >2K, trend -Supplemental O2 as needed to maintain O2 saturations 88 to 92% -Intermittent chest x-ray & ABG PRN -Budesonide inhaler/nebs BID, bronchodilators PRN  Myxedema Coma (Patient presenting with unresponsiveness, Severe bradycardia requiring  external pacing, hypotension on  Levophed and severe hypothermia found to be in severe hypothyroidism) TSH 213, Free T3 0.7, Free T4 <0.25 -s/p IV synthroid 225mcg x 1 -Increase to 48mcg 10/02/21 -Trend TSH -Hold steroids   Acute on Chronic Chronic Systolic HF with Cardiogenic Shock +/- Septic Shock (EF previously <20%% on 05/22) in the setting of severe hypothyroidism PMHx: CAD status post CABG x5, ICM, HLD,   ECHO 10/03/21  1. Left ventricular ejection fraction, by estimation, is <20%. The left  ventricle has severely decreased function. The left ventricle demonstrates  global hypokinesis. The left ventricular internal cavity size was severely  dilated. Left ventricular  diastolic parameters were normal.   2. Right ventricular systolic function is mildly reduced. The right  ventricular size is moderately enlarged.   3. Left atrial size was moderately dilated.   4. Right atrial size was moderately dilated.   5. The mitral valve is myxomatous. Moderate to severe mitral valve  regurgitation.   6. Tricuspid valve regurgitation is moderate to severe.   7. The aortic valve has been repaired/replaced. Aortic valve  regurgitation is trivial. There is a bioprosthetic valve present in the  aortic position. Echo findings are consistent with normal structure and  function of the aortic valve prosthesis.   -MR worsening from prior echo, will contribute to pulmonary edema at any volume status -Trend TSH, and correct hypothyroid state in setting of heart failure -Trend troponin, lactic -Continue IV Lasix gtt as blood pressure and renal function permits -Hold BP meds in the setting of hypotension  -Cardiology consult -Treatment of hypothyroidism -Palliative care consult to assist with [place] discussion with patient and family.   Paroxysmal Atrial Fibrillation -Hold Amiodarone -Not anticoagulated due to hx of significant GI Bleed and Anemia  AKI on CKD Stage III Likely hemodynamically mediated  with hypotension (requiring  levophed), Cardiorenal Syndrome, and venous congestion with concomitant ACE, Diurectic and NSAID use Hyponatremia Anemia of Chronic Disease -Monitor I&O's / urinary output -Follow BMP -Ensure adequate renal perfusion -Avoid nephrotoxic agents as able -Replace electrolytes as indicated -Nephrology consult, input appreciated -Patient is not a candidate for PD/HD with advanced cardiac disease, discussed with nephro   Best practice (right click and "Reselect all SmartList Selections" daily)  Diet:  Oral Pain/Anxiety/Delirium protocol (if indicated): No VAP protocol (if indicated): Not indicated DVT prophylaxis: Subcutaneous Heparin GI prophylaxis: PPI Glucose control:  SSI No Central venous access:  Yes, and it is still needed Arterial line:  N/A Foley:  Yes, and it is still needed Mobility:  bed rest  PT consulted: N/A Last date of multidisciplinary goals of care discussion [1/12] Code Status:  full code Disposition: ICU  //Dajuan Turnley

## 2021-10-04 DIAGNOSIS — N189 Chronic kidney disease, unspecified: Secondary | ICD-10-CM | POA: Diagnosis not present

## 2021-10-04 DIAGNOSIS — J90 Pleural effusion, not elsewhere classified: Secondary | ICD-10-CM | POA: Diagnosis not present

## 2021-10-04 DIAGNOSIS — I5023 Acute on chronic systolic (congestive) heart failure: Secondary | ICD-10-CM | POA: Diagnosis not present

## 2021-10-04 DIAGNOSIS — R601 Generalized edema: Secondary | ICD-10-CM | POA: Diagnosis not present

## 2021-10-04 DIAGNOSIS — Z9181 History of falling: Secondary | ICD-10-CM | POA: Diagnosis not present

## 2021-10-04 DIAGNOSIS — Z9981 Dependence on supplemental oxygen: Secondary | ICD-10-CM | POA: Diagnosis not present

## 2021-10-04 DIAGNOSIS — Z515 Encounter for palliative care: Secondary | ICD-10-CM

## 2021-10-04 DIAGNOSIS — I10 Essential (primary) hypertension: Secondary | ICD-10-CM | POA: Diagnosis not present

## 2021-10-04 DIAGNOSIS — N184 Chronic kidney disease, stage 4 (severe): Secondary | ICD-10-CM | POA: Diagnosis not present

## 2021-10-04 DIAGNOSIS — J9611 Chronic respiratory failure with hypoxia: Secondary | ICD-10-CM | POA: Diagnosis not present

## 2021-10-04 DIAGNOSIS — R570 Cardiogenic shock: Secondary | ICD-10-CM | POA: Diagnosis not present

## 2021-10-04 DIAGNOSIS — Z7982 Long term (current) use of aspirin: Secondary | ICD-10-CM | POA: Diagnosis not present

## 2021-10-04 LAB — BASIC METABOLIC PANEL
Anion gap: 10 (ref 5–15)
BUN: 140 mg/dL — ABNORMAL HIGH (ref 8–23)
CO2: 20 mmol/L — ABNORMAL LOW (ref 22–32)
Calcium: 7.8 mg/dL — ABNORMAL LOW (ref 8.9–10.3)
Chloride: 104 mmol/L (ref 98–111)
Creatinine, Ser: 4.97 mg/dL — ABNORMAL HIGH (ref 0.61–1.24)
GFR, Estimated: 11 mL/min — ABNORMAL LOW (ref >60)
Glucose, Bld: 100 mg/dL — ABNORMAL HIGH (ref 70–99)
Potassium: 4.2 mmol/L (ref 3.5–5.1)
Sodium: 134 mmol/L — ABNORMAL LOW (ref 135–145)

## 2021-10-04 LAB — CBC
HCT: 24.4 % — ABNORMAL LOW (ref 39.0–52.0)
Hemoglobin: 8.4 g/dL — ABNORMAL LOW (ref 13.0–17.0)
MCH: 34.9 pg — ABNORMAL HIGH (ref 26.0–34.0)
MCHC: 34.4 g/dL (ref 30.0–36.0)
MCV: 101.2 fL — ABNORMAL HIGH (ref 80.0–100.0)
Platelets: 102 10*3/uL — ABNORMAL LOW (ref 150–400)
RBC: 2.41 MIL/uL — ABNORMAL LOW (ref 4.22–5.81)
RDW: 17.3 % — ABNORMAL HIGH (ref 11.5–15.5)
WBC: 6.2 10*3/uL (ref 4.0–10.5)
nRBC: 0.3 % — ABNORMAL HIGH (ref 0.0–0.2)

## 2021-10-04 MED ORDER — BOOST / RESOURCE BREEZE PO LIQD CUSTOM
1.0000 | Freq: Three times a day (TID) | ORAL | Status: DC
  Administered 2021-10-04 – 2021-10-09 (×13): 1 via ORAL

## 2021-10-04 NOTE — Consult Note (Signed)
Consultation Note Date: 10/04/2021   Patient Name: [name]  DOB: 10/24/1930  MRN: [id number]  Age / Sex: 88 y.o., male  PCP: Baxter Hire, MD Referring Physician: Nelle Don, MD  Reason for Consultation: Establishing goals of care  HPI/Patient Profile: 88 y.o. male  with past medical history of CKD (stage IV), HTN, hyperparathyroidism (D/T renal insufficiency), HLD, aspiration pneumonia, systolic HF (EF 15 -58%), ischemic cardiomyopathy, bilateral carotid stenosis, severe aortic stenosis, AAA, MGUS, paroxysmal SVT, CABG (s/p LIMA to LAD, s/p bioprosthetic valve replacement-2011) admitted on 09/28/2021 with bradycardia (heart rate in the 20s) and hypotension.  Patient being treated for heart failure exacerbation in setting of myexedema coma.   Of note, MOST form completed 02/02/21 and available for [facility] docs. Pt has elected DNR, limited additional interventions, determine use or limitation of antibiotics when infection occurs, IV fluids for a defined trial period, and no feeding tube.  Clinical Assessment and Goals of Care: I have reviewed medical records including EPIC notes, labs and imaging, assessed the patient and then met with patient at bedside to discuss diagnosis prognosis, GOC, EOL wishes, disposition and options.  I introduced Palliative Medicine as specialized medical care for people living with serious illness. It focuses on providing relief from the symptoms and stress of a serious illness. The goal is to improve quality of life for both the patient and the family.  I reminded patient that he met with my coworker in May of last year.  He nodded his head and said he remembers her.  I shared that he made his wishes known and a medical orders for scope of treatment (MOST) form.  He nodded his head and said yes.  I shared that we would like to honor his wishes and ensure that all  decisions moving forward are what he would like to have happen with his medical plan of care.  He nodded his his head and again shared he would like for me to discuss this with his family.  We discussed patient's current illness and what it means in the larger context of patient's on-going co-morbidities. I attempted to elicit values and goals of care important to the patient.  At first, patient shared he did not know.  Then, he shared that he wants to feel better.  Education offered regarding concept specific to human mortality and the limitations of medical interventions to prolong life when the body begins to fail to thrive.  Family is facing treatment option decisions, advanced directive, and anticipatory care needs.    Discussed with patient the importance of continued conversation with family and the medical providers regarding overall plan of care and treatment options, ensuring decisions are within the context of the patients values and GOCs.  Patient confirmed he would like for me to speak with his son and daughters regarding his plan of care.  During our discussion nursing entered the room and shared that a family meeting is scheduled for 4 PM today.  I will be off campus and  unavailable during this meeting time today.  Dr. Merrilee Jansky and nursing made aware.  Palliative medicine team will continue to follow the patient throughout his hospitalization.  Primary Decision Maker PATIENT  Code Status/Advance Care Planning: Limited code  Prognosis:   Unable to determine  Discharge Planning: To Be Determined  Primary Diagnoses: Present on Admission:  Cardiogenic shock [facility])   Physical Exam Vitals and nursing note reviewed.  Constitutional:      Appearance: Normal appearance.  HENT:     Head: Normocephalic and atraumatic.     Mouth/Throat:     Mouth: Mucous membranes are moist.  Eyes:     Pupils: Pupils are equal, round, and reactive to light.  Cardiovascular:     Rate and  Rhythm: Bradycardia present.     Pulses: Normal pulses.  Pulmonary:     Effort: Pulmonary effort is normal.     Comments: RR 8 Abdominal:     Palpations: Abdomen is soft.  Musculoskeletal:     Comments: Generalized weakness  Skin:    General: Skin is warm and dry.  Neurological:     Mental Status: He is alert. Mental status is at baseline.  Psychiatric:        Behavior: Behavior normal.        Thought Content: Thought content normal.        Judgment: Judgment normal.    Palliative Assessment/Data: 30%     I discussed this patient's plan of care with patient, nursing, Dr. Merrilee Jansky.  Thank you for this consult. Palliative medicine will continue to follow and assist holistically.   Time Total: 70 minutes Greater than 50%  of this time was spent counseling and coordinating care related to the above assessment and plan.  Signed by: Jordan Hawks, DNP, FNP-BC Palliative Medicine    Please contact Palliative Medicine Team phone at [phone] for questions and concerns.  For individual provider: See Shea Evans

## 2021-10-04 NOTE — Progress Notes (Signed)
Telephone report called to Agustina Caroli, RN.  Patient taken to room 242 via bed by this RN.

## 2021-10-04 NOTE — Progress Notes (Signed)
CARDIOLOGY CONSULT NOTE               Patient ID: [name] MRN: [id number] DOB/AGE: [birth year] 88 y.o.  Admit date: 09/28/2021 Referring Physician Rufina Falco, NP Primary Physician Dr. Harrel Lemon Primary Cardiologist Dr. Nehemiah Massed Reason for Consultation heart failure, bradycardia  HPI 88 year old male with a past medical history significant for CAD s/p CABG x5 in 2011, HFrEF (LVEF <20%), ischemic cardiomyopathy, CKD stage V, paroxysmal atrial fibrillation, severe AS s/p bioprosthetic valve replacement 2011, AAA (3.2 cm in 2020), history of MGUS, who presented to [facility] ED 09/28/2021 after being found minimally responsive in a chair at home with reported vital signs of heart rate in the 20s and blood pressure 80/60 by EMS.  Cardiology was consulted for assistance with his heart failure exacerbation in setting of myxedema coma.   Interval history: -no acute changes -no chest pain, sob.  -goals of care discussion with patient and family regarding next steps is important  Past Medical History:  Diagnosis Date   Anemia    Aortic aneurysm (HCC)    Aortic aneurysm (HCC)    Arthritis    CHF (congestive heart failure) (HCC)    CKD (chronic kidney disease)    ALSO LEFT RENAL MASS   Coronary artery disease    Diverticulosis    History of perforated diverticulitis   GERD (gastroesophageal reflux disease)    History of hiatal hernia    Hypercholesteremia    Hypertension    MGUS (monoclonal gammopathy of unknown significance)    MGUS (monoclonal gammopathy of unknown significance)     Past Surgical History:  Procedure Laterality Date   AORTIC VALVE REPLACEMENT     bipass     CARDIAC VALVE REPLACEMENT     pig valve   CHOLECYSTECTOMY     Open   COLON SURGERY     History of Hartman's procedure with subsequent colostomy reversal   CORONARY ANGIOPLASTY     CORONARY ARTERY BYPASS GRAFT     ESOPHAGOGASTRODUODENOSCOPY (EGD) WITH PROPOFOL N/A 02/07/2015   Procedure:  ESOPHAGOGASTRODUODENOSCOPY (EGD) WITH PROPOFOL;  Surgeon: Hulen Luster, MD;  Location: [facility] ENDOSCOPY;  Service: Gastroenterology;  Laterality: N/A;   EYE SURGERY     HERNIA REPAIR     INTRAMEDULLARY (IM) NAIL INTERTROCHANTERIC Right 05/12/2020   Procedure: INTRAMEDULLARY (IM) NAIL INTERTROCHANTRIC;  Surgeon: Lovell Sheehan, MD;  Location: [facility] ORS;  Service: Orthopedics;  Laterality: Right;   JOINT REPLACEMENT     left knee x4   KNEE ARTHROPLASTY Right 04/02/2016   Procedure: COMPUTER ASSISTED TOTAL KNEE ARTHROPLASTY;  Surgeon: Dereck Leep, MD;  Location: [facility] ORS;  Service: Orthopedics;  Laterality: Right;   ORIF FEMUR FRACTURE Right 08/08/2020   Procedure: OPEN REDUCTION INTERNAL FIXATION (ORIF) DISTAL FEMUR FRACTURE;  Surgeon: Lovell Sheehan, MD;  Location: [facility] ORS;  Service: Orthopedics;  Laterality: Right;    Medications Prior to Admission  Medication Sig Dispense Refill Last Dose   acetaminophen (TYLENOL) 500 MG tablet Take 1,000 mg by mouth every 6 (six) hours as needed for mild pain.   Unknown at PRN   albuterol (VENTOLIN HFA) 108 (90 Base) MCG/ACT inhaler Inhale 1-2 puffs into the lungs every 6 (six) hours as needed for wheezing or shortness of breath.   Unknown at PRN   amiodarone (PACERONE) 200 MG tablet Take 1 tablet (200 mg total) by mouth daily. 60 tablet 1 09/28/2021 at 0800   aspirin EC 81 MG tablet Take 1 tablet (81 mg  total) by mouth 2 (two) times daily. Swallow whole. 30 tablet 0 09/28/2021 at 0800   calcitRIOL (ROCALTROL) 0.25 MCG capsule Take 0.25 mcg by mouth daily.   09/28/2021 at 0800   losartan (COZAAR) 25 MG tablet Take 1 tablet (25 mg total) by mouth daily. (Patient taking differently: Take 12.5 mg by mouth daily.) 30 tablet 1 09/28/2021 at 0800   pravastatin (PRAVACHOL) 20 MG tablet Take 20 mg by mouth every evening.   09/27/2021 at 2000   torsemide (DEMADEX) 20 MG tablet Take 1 tablet (20 mg total) by mouth daily. (Patient taking differently: Take 20 mg by mouth  daily. (May take additional 20mg  tablet if needed for excessive fluid weight)) 60 tablet 1 09/28/2021 at 0800   Social History   Socioeconomic History   Marital status: Widowed    Spouse name: Not on file   Number of children: Not on file   Years of education: Not on file   Highest education level: Not on file  Occupational History   Not on file  Tobacco Use   Smoking status: Former   Smokeless tobacco: Never  Vaping Use   Vaping Use: Never used  Substance and Sexual Activity   Alcohol use: No   Drug use: No   Sexual activity: Not on file  Other Topics Concern   Not on file  Social History Narrative   Not on file   Social Determinants of Health   Financial Resource Strain: Not on file  Food Insecurity: Not on file  Transportation Needs: Not on file  Physical Activity: Not on file  Stress: Not on file  Social Connections: Not on file  Intimate Partner Violence: Not on file    Family History  Problem Relation Age of Onset   Hypertension Mother    Heart failure Mother    Heart failure Father    Bladder Cancer Neg Hx    Prostate cancer Neg Hx    Kidney cancer Neg Hx     Review of systems complete and found to be negative unless listed above  PHYSICAL EXAM General: Pleasant elderly and frail appearing Caucasian male, in no acute distress. Sitting upright eating breakfast.  HEENT:  Normocephalic and atraumatic. Neck:  No JVD.  Lungs: Normal respiratory effort on 2 L by nasal cannula.  Heart: HRRR . Normal S1 and S2. Radial pulses 2+ bilaterally. Abdomen: Non-distended appearing.  Msk: Normal strength and tone for age. Extremities: No clubbing, cyanosis.  Trace bilateral pedal edema and associated chronic venous stasis changes in bilateral lower extremities.  Ecchymosis on dorsum of left hand.  Neuro: alert and oriented x 3 Psych:  frustrated mood, affect congruent.   Labs:   Lab Results  Component Value Date   WBC 6.2 10/04/2021   HGB 8.4 (L) 10/04/2021   HCT  24.4 (L) 10/04/2021   MCV 101.2 (H) 10/04/2021   PLT 102 (L) 10/04/2021    Recent Labs  Lab 09/28/21 2147 09/29/21 0520 10/04/21 0404  NA 130*   < > 134*  K 4.3   < > 4.2  CL 102   < > 104  CO2 16*   < > 20*  BUN 133*   < > 140*  CREATININE 4.69*   < > 4.97*  CALCIUM 7.9*   < > 7.8*  PROT 6.6  --   --   BILITOT 1.0  --   --   ALKPHOS 63  --   --   ALT 20  --   --  AST 33  --   --   GLUCOSE 102*   < > 100*   < > = values in this interval not displayed.    No results found for: CKTOTAL, CKMB, CKMBINDEX, TROPONINI No results found for: CHOL No results found for: HDL No results found for: LDLCALC No results found for: TRIG No results found for: CHOLHDL No results found for: LDLDIRECT    Radiology: CT Head Wo Contrast  Result Date: 09/28/2021 CLINICAL DATA:  Altered mental status.  Unresponsive. EXAM: CT HEAD WITHOUT CONTRAST TECHNIQUE: Contiguous axial images were obtained from the base of the skull through the vertex without intravenous contrast. RADIATION DOSE REDUCTION: This exam was performed according to the departmental dose-optimization program which includes automated exposure control, adjustment of the mA and/or kV according to patient size and/or use of iterative reconstruction technique. COMPARISON:  05/11/2020 FINDINGS: Brain: Stable degree of atrophy and chronic small vessel ischemia. No intracranial hemorrhage, mass effect, or midline shift. No hydrocephalus. The basilar cisterns are patent. No evidence of territorial infarct or acute ischemia. No extra-axial or intracranial fluid collection. Vascular: Atherosclerosis of skullbase vasculature without hyperdense vessel or abnormal calcification. Patchy air in the cavernous sinuses, typically related to IV catheter placement. Skull: No fracture or focal lesion. Sinuses/Orbits: No evidence of acute fracture. No paranasal sinus inflammation. Bilateral cataract resection. Mastoid air cells are clear. Other: There is air within  the subcutaneous soft tissues about the facial bones and temporal as musculature. This is typically related to IV catheter placement. IMPRESSION: 1. No acute intracranial abnormality. 2. Stable atrophy and chronic small vessel ischemia. 3. Patchy air in the cavernous sinuses and periorbital soft tissues, typically related to IV catheter placement. Electronically Signed   By: Keith Rake M.D.   On: 09/28/2021 21:06   DG Chest Port 1 View  Result Date: 09/30/2021 CLINICAL DATA:  Encounter for central line placement EXAM: PORTABLE CHEST 1 VIEW COMPARISON:  Chest x-ray 09/30/2021 5:47 p.m., CT chest 05/19/2021, chest x-ray 09/29/2021 FINDINGS: Interval advancement of right internal jugular central venous catheter with tip overlying the expected region of the superior caval junction. Lines and tubes overlie the patient. Stable enlarged cardiac silhouette. The heart and mediastinal contours are unchanged. Aortic calcification prominent hilar vasculature. Patchy airspace opacities within the right lower lobe. Limited evaluation left lower lobe due to overlying cardiac paddle. Increased interstitial markings. Bilateral trace to small volume pleural effusion. No pneumothorax. No acute osseous abnormality. IMPRESSION: 1. Interval advancement of right internal jugular central venous catheter with tip overlying the expected region of the superior caval junction. 2. Pulmonary edema. 3. Bilateral trace to small volume pleural effusion. 4. Patchy right lower lobe opacities suggestive of infection/inflammation. Followup PA and lateral chest X-ray is recommended in 3-4 weeks following therapy to ensure resolution and exclude underlying malignancy. Electronically Signed   By: Iven Finn M.D.   On: 09/30/2021 19:12   DG Chest Port 1 View  Result Date: 09/30/2021 CLINICAL DATA:  Central line placement. Clinical concern for central lying being pulled back. EXAM: PORTABLE CHEST 1 VIEW COMPARISON:  09/29/2021. FINDINGS:  Right internal jugular central venous line now has its tip projecting over the right apex consistent with placement in the internal jugular vein superior to its confluence with the subclavian vein. No pneumothorax. Interstitial and hazy airspace lung opacities are noted that are without significant change from the prior exam. Small bilateral pleural effusions are stable. IMPRESSION: 1. Retracted right internal jugular central venous line as detailed. 2. No  other change from the prior chest radiograph. Electronically Signed   By: Lajean Manes M.D.   On: 09/30/2021 18:05   DG Chest Port 1 View  Result Date: 09/29/2021 CLINICAL DATA:  Encounter for central line placement EXAM: PORTABLE CHEST 1 VIEW COMPARISON:  Yesterday FINDINGS: New right IJ line with tip at the upper cavoatrial junction. Cardiomegaly. Prior CABG and aortic valve replacement. Pleural effusions and pulmonary opacity which are unchanged. No visible pneumothorax. IMPRESSION: 1. Right IJ line without complicating feature. 2. Stable pleural effusions and pulmonary opacity. Electronically Signed   By: Jorje Guild M.D.   On: 09/29/2021 04:42   DG Chest Portable 1 View  Result Date: 09/28/2021 CLINICAL DATA:  Altered mental status. EXAM: PORTABLE CHEST 1 VIEW COMPARISON:  07/28/2021 FINDINGS: Post median sternotomy with prosthetic cardiac valve. The heart is enlarged. Bilateral pleural effusions and bibasilar opacities, mildly increased. Right pleural effusion is likely loculated tracking towards the apex. Chronic interstitial coarsening. No pneumothorax. IMPRESSION: Cardiomegaly with bilateral pleural effusions and bibasilar opacities, mildly increased from prior radiograph. Chronic interstitial coarsening. Electronically Signed   By: Keith Rake M.D.   On: 09/28/2021 20:58   ECHOCARDIOGRAM COMPLETE  Result Date: 10/03/2021    ECHOCARDIOGRAM REPORT   Patient Name:   [name] Date of Exam: 10/03/2021 Medical Rec #:  [id number]        Height:       72.0 in Accession #:    [id number]      Weight:       166.0 lb Date of Birth:  1931-03-04       BSA:          1.968 m Patient Age:    64 years        BP:           103/69 mmHg Patient Gender: M               HR:           61 bpm. Exam Location:  [facility] Procedure: 2D Echo, Cardiac Doppler and Color Doppler Indications:     CHF-acute systolic G25.42  History:         Patient has prior history of Echocardiogram examinations, most                  recent 01/31/2021. CHF; Risk Factors:Hypertension. CKD.                  Aortic Valve: bioprosthetic valve is present in the aortic                  position.  Sonographer:     Sherrie Sport Referring Phys:  [id number] Bradly Bienenstock Diagnosing Phys: Serafina Royals MD  Sonographer Comments: No subcostal window. IMPRESSIONS  1. Left ventricular ejection fraction, by estimation, is <20%. The left ventricle has severely decreased function. The left ventricle demonstrates global hypokinesis. The left ventricular internal cavity size was severely dilated. Left ventricular diastolic parameters were normal.  2. Right ventricular systolic function is mildly reduced. The right ventricular size is moderately enlarged.  3. Left atrial size was moderately dilated.  4. Right atrial size was moderately dilated.  5. The mitral valve is myxomatous. Moderate to severe mitral valve regurgitation.  6. Tricuspid valve regurgitation is moderate to severe.  7. The aortic valve has been repaired/replaced. Aortic valve regurgitation is trivial. There is a bioprosthetic valve present in the aortic position. Echo findings are consistent with normal structure and  function of the aortic valve prosthesis. FINDINGS  Left Ventricle: Left ventricular ejection fraction, by estimation, is <20%. The left ventricle has severely decreased function. The left ventricle demonstrates global hypokinesis. The left ventricular internal cavity size was severely dilated. There is no left ventricular hypertrophy.  Left ventricular diastolic parameters were normal. Right Ventricle: The right ventricular size is moderately enlarged. No increase in right ventricular wall thickness. Right ventricular systolic function is mildly reduced. Left Atrium: Left atrial size was moderately dilated. Right Atrium: Right atrial size was moderately dilated. Pericardium: There is no evidence of pericardial effusion. Mitral Valve: The mitral valve is myxomatous. Moderate to severe mitral valve regurgitation. MV peak gradient, 2.1 mmHg. The mean mitral valve gradient is 1.0 mmHg. Tricuspid Valve: The tricuspid valve is normal in structure. Tricuspid valve regurgitation is moderate to severe. Aortic Valve: The aortic valve has been repaired/replaced. Aortic valve regurgitation is trivial. Aortic valve mean gradient measures 3.3 mmHg. Aortic valve peak gradient measures 6.1 mmHg. Aortic valve area, by VTI measures 1.02 cm. There is a bioprosthetic valve present in the aortic position. Echo findings are consistent with normal structure and function of the aortic valve prosthesis. Pulmonic Valve: The pulmonic valve was normal in structure. Pulmonic valve regurgitation is mild. Aorta: The aortic root and ascending aorta are structurally normal, with no evidence of dilitation. IAS/Shunts: No atrial level shunt detected by color flow Doppler.  LEFT VENTRICLE PLAX 2D LVIDd:         6.00 cm      Diastology LVIDs:         5.60 cm      LV e' medial:    3.48 cm/s LV PW:         0.80 cm      LV E/e' medial:  19.6 LV IVS:        1.10 cm      LV e' lateral:   3.70 cm/s LVOT diam:     2.10 cm      LV E/e' lateral: 18.4 LV SV:         20 LV SV Index:   10 LVOT Area:     3.46 cm  LV Volumes (MOD) LV vol d, MOD A2C: 245.0 ml LV vol d, MOD A4C: 173.0 ml LV vol s, MOD A2C: 179.0 ml LV vol s, MOD A4C: 148.0 ml LV SV MOD A2C:     66.0 ml LV SV MOD A4C:     173.0 ml LV SV MOD BP:      43.0 ml RIGHT VENTRICLE RV Basal diam:  5.20 cm RV S prime:     5.55 cm/s TAPSE  (M-mode): 1.1 cm LEFT ATRIUM              Index        RIGHT ATRIUM           Index LA diam:        4.50 cm  2.29 cm/m   RA Area:     30.40 cm LA Vol (A2C):   121.0 ml 61.47 ml/m  RA Volume:   93.60 ml  47.55 ml/m LA Vol (A4C):   78.7 ml  39.98 ml/m LA Biplane Vol: 99.7 ml  50.65 ml/m  AORTIC VALVE                    PULMONIC VALVE AV Area (Vmax):    0.84 cm     PV Vmax:  0.50 m/s AV Area (Vmean):   0.87 cm     PV Vmean:         31.600 cm/s AV Area (VTI):     1.02 cm     PV VTI:           0.092 m AV Vmax:           123.67 cm/s  PV Peak grad:     1.0 mmHg AV Vmean:          82.900 cm/s  PV Mean grad:     0.0 mmHg AV VTI:            0.199 m      PR End Diast Vel: 11.72 msec AV Peak Grad:      6.1 mmHg     RVOT Peak grad:   2 mmHg AV Mean Grad:      3.3 mmHg LVOT Vmax:         30.00 cm/s LVOT Vmean:        20.800 cm/s LVOT VTI:          0.059 m LVOT/AV VTI ratio: 0.29  AORTA Ao Root diam: 4.33 cm MITRAL VALVE               TRICUSPID VALVE MV Area (PHT): 3.61 cm    TR Peak grad:   53.6 mmHg MV Area VTI:   1.23 cm    TR Vmax:        366.00 cm/s MV Peak grad:  2.1 mmHg MV Mean grad:  1.0 mmHg    SHUNTS MV Vmax:       0.72 m/s    Systemic VTI:  0.06 m MV Vmean:      51.9 cm/s   Systemic Diam: 2.10 cm MV Decel Time: 210 msec    Pulmonic VTI:  0.162 m MV E velocity: 68.10 cm/s MV A velocity: 55.70 cm/s MV E/A ratio:  1.22 Serafina Royals MD Electronically signed by Serafina Royals MD Signature Date/Time: 10/03/2021/1:49:11 PM    Final     ECHO 10/03/2021  1. Left ventricular ejection fraction, by estimation, is <20%. The left  ventricle has severely decreased function. The left ventricle demonstrates  global hypokinesis. The left ventricular internal cavity size was severely  dilated. Left ventricular  diastolic parameters were normal.   2. Right ventricular systolic function is mildly reduced. The right  ventricular size is moderately enlarged.   3. Left atrial size was moderately dilated.   4.  Right atrial size was moderately dilated.   5. The mitral valve is myxomatous. Moderate to severe mitral valve  regurgitation.   6. Tricuspid valve regurgitation is moderate to severe.   7. The aortic valve has been repaired/replaced. Aortic valve  regurgitation is trivial. There is a bioprosthetic valve present in the  aortic position. Echo findings are consistent with normal structure and  function of the aortic valve prosthesis.   TELEMETRY reviewed by me: NSR rate 72  EKG reviewed by me: Junctional rhythm rate of 52  ASSESSMENT AND PLAN:  67-year-old male with a past medical history significant for CAD s/p CABG x5 in 2011, HFrEF (LVEF <20%), ischemic cardiomyopathy, CKD stage IV, paroxysmal atrial fibrillation, severe AS s/p bioprosthetic valve replacement 2011, AAA (3.2 cm in 2020), history of MGUS, who presented to [facility] ED 09/28/2021 after being found minimally responsive in a chair at home with reported vital signs of temperature 93 F, heart rate in the 20s and blood pressure 80/60  by EMS.  Cardiology was consulted for assistance with his heart failure exacerbation in setting of myxedema coma.  #Severe hypothyroidism / myxedema coma ?History of thyroid disorder. Patient presented hypothermic, mildly hyponatremic, bradycardic and hypotensive.  TSH > 200.  -Management per primary team. Likely driving shock state.   #Acute on chronic HFrEF (LVEF less than 20% 01/2021)  #Ischemic cardiomyopathy #Stage V CKD #Macrocytic anemia - Levophed gtt. stopped the afternoon of 1/15.  Remains on midodirine. - lasix infusion discontinued 1/15, remains on lasix IV and renal function is worsening.  -Echocardiogram revealed LVEF <20% and global hypokinesis and myxomatous mitral valve -Home medications on hold for hypotension. losartan 12.5 mg once daily, torsemide 20 mg once daily with as needed additional 20 mg if he gained weight.  We will escalate GDMT as appropriate in patient's clinical course. -no  further cardiac diagnostics at this time.  -Agree with palliative consult for goals of care discussion.   #Acute hypoxic respiratory failure supportive care per primary team, on 2 L of oxygen at baseline.   #CAD s/p CABGx5 2011 #Hyperlipidemia Continue pravastatin 20 milligrams and aspirin  #Paroxysmal atrial fibrillation Continue to hold amiodarone. On 200 mg once daily at home, not anticoagulated due to patient preference and concerns of significant bleeding and his known anemia.  This patient's case was discussed with Dr. Serafina Royals and he is in agreement.   Signed: Tristan Schroeder , PA-C 10/04/2021, 10:28 AM

## 2021-10-04 NOTE — Progress Notes (Signed)
°   10/04/21 1400  Clinical Encounter Type  Visited With Patient and family together  Visit Type Follow-up   Chaplain popped in to meet new family members that were visiting. The visit was brief but assured them of his prayers.

## 2021-10-04 NOTE — Progress Notes (Signed)
[place] for Electrolyte Monitoring and Replacement   Recent Labs: Potassium (mmol/L)  Date Value  10/04/2021 4.2  12/09/2014 4.7   Magnesium (mg/dL)  Date Value  10/02/2021 2.4   Calcium (mg/dL)  Date Value  10/04/2021 7.8 (L)   Calcium, Total (mg/dL)  Date Value  12/09/2014 8.5 (L)   Albumin (g/dL)  Date Value  10/01/2021 3.0 (L)   Phosphorus (mg/dL)  Date Value  10/02/2021 7.0 (H)   Sodium (mmol/L)  Date Value  10/04/2021 134 (L)  12/09/2014 126 (L)   Assessment: 88 year old male presented with bradycardia PTA, initially being paced on arrival. Labs revealed significantly elevated TSH and low T4. Patient followed by Nephrology outpatient, awaiting PD catheter placement. Cardiology also consulted, patient with HFrEF. Patient was started on Lasix infusion for volume overload but currently on 60 mg IV q12h. Pharmacy consult to follow and replace electrolytes.   Goal of Therapy:  Electrolytes within normal limits  Plan:  No electrolyte replacement indicated at this time Follow-up electrolytes with AM labs tomorrow  Owens Loffler, PharmD Candidate 10/04/2021 7:36 AM

## 2021-10-04 NOTE — Progress Notes (Signed)
[facility]  ROUNDING NOTE   Subjective:   Patient resting quietly on arrival Appears agitated during interview Alert Voiced frustration with plan and possible palliative recommendation  Remains edematous  UOP remains 1278mL.   Creatinine 4.81.   Objective:  Vital signs in last 24 hours:  Temp:  [96.4 F (35.8 C)-99.1 F (37.3 C)] 99 F (37.2 C) (01/18 0700) Pulse Rate:  [57-68] 66 (01/18 0700) Resp:  [0-25] 17 (01/18 0700) BP: (85-101)/(56-68) 88/61 (01/18 0700) SpO2:  [97 %-100 %] 98 % (01/18 0700) Weight:  [75.4 kg] 75.4 kg (01/18 0500)  Weight change: 0.1 kg Filed Weights   10/02/21 0412 10/03/21 0500 10/04/21 0500  Weight: 74.8 kg 75.3 kg 75.4 kg    Intake/Output: I/O last 3 completed shifts: In: 360 [P.O.:240; IV Piggyback:120] Out: 1763 [Urine:1763]   Intake/Output this shift:  Total I/O In: 240 [P.O.:240] Out: -   Physical Exam: General: Chronically ill-appearing, sitting up in bed  Head: Normocephalic, atraumatic. Moist oral mucosal membranes  Eyes: Anicteric  Neck: Supple  Lungs:  Basilar rales, normal effort, 2L [place]  Heart: regular  Abdomen:  Soft, nontender, bowel sounds present  Extremities: 1+ lower extremity edema.2+ BUE edema  Neurologic: Awake, alert, following commands  Skin: No acute rash, generalized bruising  Access: NONE    Basic Metabolic Panel: Recent Labs  Lab 09/29/21 0520 09/29/21 2017 09/30/21 0549 10/01/21 0411 10/02/21 0411 10/02/21 1730 10/03/21 0523 10/04/21 0404  NA 133*  --  132* 132* 132* 133* 134* 134*  K 4.2   < > 3.9 3.6 4.2 4.0 4.3 4.2  CL 104  --  102 102 103 104 103 104  CO2 18*  --  18* 18* 19* 19* 19* 20*  GLUCOSE 117*  --  121* 112* 90 143* 107* 100*  BUN 127*  --  126* 116* 134* 125* 130* 140*  CREATININE 4.69*  --  4.47* 4.49* 4.99* 4.78* 4.81* 4.97*  CALCIUM 8.0*  --  8.0* 8.1* 7.9* 7.6* 7.8* 7.8*  MG 2.4  --  2.4 2.3 2.4  --   --   --   PHOS 6.6*  --  6.4* 6.4* 7.0*  --   --   --     < > = values in this interval not displayed.     Liver Function Tests: Recent Labs  Lab 09/28/21 2147 10/01/21 0411  AST 33  --   ALT 20  --   ALKPHOS 63  --   BILITOT 1.0  --   PROT 6.6  --   ALBUMIN 3.2* 3.0*    No results for input(s): LIPASE, AMYLASE in the last 168 hours. No results for input(s): AMMONIA in the last 168 hours.  CBC: Recent Labs  Lab 09/28/21 2015 09/29/21 0520 09/30/21 0549 10/01/21 0411 10/02/21 0411 10/03/21 0523 10/04/21 0404  WBC 4.9   < > 6.1 6.9 6.5 6.5 6.2  NEUTROABS 2.9  --   --   --   --   --   --   HGB 8.7*   < > 9.4* 9.0* 8.0* 8.3* 8.4*  HCT 26.4*   < > 27.2* 26.2* 22.8* 24.7* 24.4*  MCV 105.6*   < > 100.0 101.2* 100.9* 101.2* 101.2*  PLT 134*   < > 141* 143* 109* 108* 102*   < > = values in this interval not displayed.     Cardiac Enzymes: No results for input(s): CKTOTAL, CKMB, CKMBINDEX, TROPONINI in the last 168 hours.  BNP:  Invalid input(s): POCBNP  CBG: Recent Labs  Lab 09/29/21 0231  ELFYBO 17     Microbiology: Results for orders placed or performed during the hospital encounter of 09/28/21  Resp Panel by RT-PCR (Flu A&B, Covid) Nasopharyngeal Swab     Status: None   Collection Time: 09/28/21  8:19 PM   Specimen: Nasopharyngeal Swab; Nasopharyngeal(NP) swabs in vial transport medium  Result Value Ref Range Status   SARS Coronavirus 2 by RT PCR NEGATIVE NEGATIVE Final    Comment: (NOTE) SARS-CoV-2 target nucleic acids are NOT DETECTED.  The SARS-CoV-2 RNA is generally detectable in upper respiratory specimens during the acute phase of infection. The lowest concentration of SARS-CoV-2 viral copies this assay can detect is 138 copies/mL. A negative result does not preclude SARS-Cov-2 infection and should not be used as the sole basis for treatment or other patient management decisions. A negative result may occur with  improper specimen collection/handling, submission of specimen other than nasopharyngeal  swab, presence of viral mutation(s) within the areas targeted by this assay, and inadequate number of viral copies(<138 copies/mL). A negative result must be combined with clinical observations, patient history, and epidemiological information. The expected result is Negative.  Fact Sheet for Patients:  EntrepreneurPulse.com.au  Fact Sheet for Healthcare Providers:  IncredibleEmployment.be  This test is no t yet approved or cleared by the [place] FDA and  has been authorized for detection and/or diagnosis of SARS-CoV-2 by FDA under an Emergency Use Authorization (EUA). This EUA will remain  in effect (meaning this test can be used) for the duration of the COVID-19 declaration under Section 564(b)(1) of the Act, 21 U.S.C.section 360bbb-3(b)(1), unless the authorization is terminated  or revoked sooner.       Influenza A by PCR NEGATIVE NEGATIVE Final   Influenza B by PCR NEGATIVE NEGATIVE Final    Comment: (NOTE) The Xpert Xpress SARS-CoV-2/FLU/RSV plus assay is intended as an aid in the diagnosis of influenza from Nasopharyngeal swab specimens and should not be used as a sole basis for treatment. Nasal washings and aspirates are unacceptable for Xpert Xpress SARS-CoV-2/FLU/RSV testing.  Fact Sheet for Patients: EntrepreneurPulse.com.au  Fact Sheet for Healthcare Providers: IncredibleEmployment.be  This test is not yet approved or cleared by the [place] FDA and has been authorized for detection and/or diagnosis of SARS-CoV-2 by FDA under an Emergency Use Authorization (EUA). This EUA will remain in effect (meaning this test can be used) for the duration of the COVID-19 declaration under Section 564(b)(1) of the Act, 21 U.S.C. section 360bbb-3(b)(1), unless the authorization is terminated or revoked.  Performed at [facility], [address] [place], [place] [address]   Culture,  blood (routine x 2)     Status: None   Collection Time: 09/28/21  9:47 PM   Specimen: BLOOD  Result Value Ref Range Status   Specimen Description BLOOD LEFT FOREARM  Final   Special Requests   Final    BOTTLES DRAWN AEROBIC AND ANAEROBIC Blood Culture adequate volume   Culture   Final    NO GROWTH 5 DAYS Performed at [facility], [address] [place], [place] [address]    Report Status 10/03/2021 FINAL  Final  Culture, blood (routine x 2)     Status: None   Collection Time: 09/28/21  9:47 PM   Specimen: BLOOD  Result Value Ref Range Status   Specimen Description BLOOD LEFT FOREARM  Final   Special Requests IN PEDIATRIC BOTTLE Blood Culture adequate volume  Final   Culture   Final    NO GROWTH 5 DAYS Performed at [facility], [address] [place], [place] [address]    Report Status 10/03/2021 FINAL  Final  MRSA Next Gen by PCR, Nasal     Status: Abnormal   Collection Time: 09/29/21  2:38 AM   Specimen: Nasal Mucosa; Nasal Swab  Result Value Ref Range Status   MRSA by PCR Next Gen DETECTED (A) NOT DETECTED Final    Comment: RESULT CALLED TO, READ BACK BY AND VERIFIED WITH: Hansel Starling @0353  on 09/29/21 SKL (NOTE) The GeneXpert MRSA Assay (FDA approved for NASAL specimens only), is one component of a comprehensive MRSA colonization surveillance program. It is not intended to diagnose MRSA infection nor to guide or monitor treatment for MRSA infections. Test performance is not FDA approved in patients less than 49 years old. Performed at [facility], [address] [place], [place] [address]     Coagulation Studies: No results for input(s): LABPROT, INR in the last 72 hours.   Urinalysis: No results for input(s): COLORURINE, LABSPEC, PHURINE, GLUCOSEU, HGBUR, BILIRUBINUR, KETONESUR, PROTEINUR, UROBILINOGEN, NITRITE, LEUKOCYTESUR in the last 72 hours.  Invalid input(s): APPERANCEUR     Imaging: ECHOCARDIOGRAM COMPLETE  Result  Date: 10/03/2021    ECHOCARDIOGRAM REPORT   Patient Name:   [name] Date of Exam: 10/03/2021 Medical Rec #:  [id number]       Height:       72.0 in Accession #:    [id number]      Weight:       166.0 lb Date of Birth:  05/18/1931       BSA:          1.968 m Patient Age:    88 years        BP:           103/69 mmHg Patient Gender: M               HR:           61 bpm. Exam Location:  [facility] Procedure: 2D Echo, Cardiac Doppler and Color Doppler Indications:     CHF-acute systolic Q11.94  History:         Patient has prior history of Echocardiogram examinations, most                  recent 01/31/2021. CHF; Risk Factors:Hypertension. CKD.                  Aortic Valve: bioprosthetic valve is present in the aortic                  position.  Sonographer:     Sherrie Sport Referring Phys:  [id number] Bradly Bienenstock Diagnosing Phys: Serafina Royals MD  Sonographer Comments: No subcostal window. IMPRESSIONS  1. Left ventricular ejection fraction, by estimation, is <20%. The left ventricle has severely decreased function. The left ventricle demonstrates global hypokinesis. The left ventricular internal cavity size was severely dilated. Left ventricular diastolic parameters were normal.  2. Right ventricular systolic function is mildly reduced. The right ventricular size is moderately enlarged.  3. Left atrial size was moderately dilated.  4. Right atrial size was moderately dilated.  5. The mitral valve is myxomatous. Moderate to severe mitral valve regurgitation.  6. Tricuspid valve regurgitation is moderate to severe.  7. The aortic valve has been repaired/replaced. Aortic valve regurgitation is trivial. There is a bioprosthetic valve present  in the aortic position. Echo findings are consistent with normal structure and function of the aortic valve prosthesis. FINDINGS  Left Ventricle: Left ventricular ejection fraction, by estimation, is <20%. The left ventricle has severely decreased function. The left ventricle  demonstrates global hypokinesis. The left ventricular internal cavity size was severely dilated. There is no left ventricular hypertrophy. Left ventricular diastolic parameters were normal. Right Ventricle: The right ventricular size is moderately enlarged. No increase in right ventricular wall thickness. Right ventricular systolic function is mildly reduced. Left Atrium: Left atrial size was moderately dilated. Right Atrium: Right atrial size was moderately dilated. Pericardium: There is no evidence of pericardial effusion. Mitral Valve: The mitral valve is myxomatous. Moderate to severe mitral valve regurgitation. MV peak gradient, 2.1 mmHg. The mean mitral valve gradient is 1.0 mmHg. Tricuspid Valve: The tricuspid valve is normal in structure. Tricuspid valve regurgitation is moderate to severe. Aortic Valve: The aortic valve has been repaired/replaced. Aortic valve regurgitation is trivial. Aortic valve mean gradient measures 3.3 mmHg. Aortic valve peak gradient measures 6.1 mmHg. Aortic valve area, by VTI measures 1.02 cm. There is a bioprosthetic valve present in the aortic position. Echo findings are consistent with normal structure and function of the aortic valve prosthesis. Pulmonic Valve: The pulmonic valve was normal in structure. Pulmonic valve regurgitation is mild. Aorta: The aortic root and ascending aorta are structurally normal, with no evidence of dilitation. IAS/Shunts: No atrial level shunt detected by color flow Doppler.  LEFT VENTRICLE PLAX 2D LVIDd:         6.00 cm      Diastology LVIDs:         5.60 cm      LV e' medial:    3.48 cm/s LV PW:         0.80 cm      LV E/e' medial:  19.6 LV IVS:        1.10 cm      LV e' lateral:   3.70 cm/s LVOT diam:     2.10 cm      LV E/e' lateral: 18.4 LV SV:         20 LV SV Index:   10 LVOT Area:     3.46 cm  LV Volumes (MOD) LV vol d, MOD A2C: 245.0 ml LV vol d, MOD A4C: 173.0 ml LV vol s, MOD A2C: 179.0 ml LV vol s, MOD A4C: 148.0 ml LV SV MOD A2C:      66.0 ml LV SV MOD A4C:     173.0 ml LV SV MOD BP:      43.0 ml RIGHT VENTRICLE RV Basal diam:  5.20 cm RV S prime:     5.55 cm/s TAPSE (M-mode): 1.1 cm LEFT ATRIUM              Index        RIGHT ATRIUM           Index LA diam:        4.50 cm  2.29 cm/m   RA Area:     30.40 cm LA Vol (A2C):   121.0 ml 61.47 ml/m  RA Volume:   93.60 ml  47.55 ml/m LA Vol (A4C):   78.7 ml  39.98 ml/m LA Biplane Vol: 99.7 ml  50.65 ml/m  AORTIC VALVE                    PULMONIC VALVE AV Area (Vmax):    0.84 cm  PV Vmax:          0.50 m/s AV Area (Vmean):   0.87 cm     PV Vmean:         31.600 cm/s AV Area (VTI):     1.02 cm     PV VTI:           0.092 m AV Vmax:           123.67 cm/s  PV Peak grad:     1.0 mmHg AV Vmean:          82.900 cm/s  PV Mean grad:     0.0 mmHg AV VTI:            0.199 m      PR End Diast Vel: 11.72 msec AV Peak Grad:      6.1 mmHg     RVOT Peak grad:   2 mmHg AV Mean Grad:      3.3 mmHg LVOT Vmax:         30.00 cm/s LVOT Vmean:        20.800 cm/s LVOT VTI:          0.059 m LVOT/AV VTI ratio: 0.29  AORTA Ao Root diam: 4.33 cm MITRAL VALVE               TRICUSPID VALVE MV Area (PHT): 3.61 cm    TR Peak grad:   53.6 mmHg MV Area VTI:   1.23 cm    TR Vmax:        366.00 cm/s MV Peak grad:  2.1 mmHg MV Mean grad:  1.0 mmHg    SHUNTS MV Vmax:       0.72 m/s    Systemic VTI:  0.06 m MV Vmean:      51.9 cm/s   Systemic Diam: 2.10 cm MV Decel Time: 210 msec    Pulmonic VTI:  0.162 m MV E velocity: 68.10 cm/s MV A velocity: 55.70 cm/s MV E/A ratio:  1.22 Serafina Royals MD Electronically signed by Serafina Royals MD Signature Date/Time: 10/03/2021/1:49:11 PM    Final      Medications:    sodium chloride Stopped (09/29/21 2200)    Chlorhexidine Gluconate Cloth  6 each Topical Q0600   feeding supplement  237 mL Oral BID BM   furosemide  60 mg Intravenous Q12H   heparin  5,000 Units Subcutaneous Q8H   levothyroxine  75 mcg Oral Q0600   midodrine  10 mg Oral TID WC   multivitamin with minerals  1  tablet Oral Daily   mupirocin ointment   Nasal BID   docusate sodium, polyethylene glycol  Assessment/ Plan:   [name] is a 88 y.o. 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 Started on IV levothyroxine and now on levothyroxine 25mg  daily. Also on stress dose steroids.   1. Acute kidney injury on Chronic kidney disease stage V: baseline creatinine of 3.9 on 09/04/21. Patient with chronic kidney disease secondary to hypertension and vascular disease. Acute kidney injury secondary to acute exacerbation of chronic  systolic congestive heart failure: Family was interested in peritoneal dialysis prior to admission. Needed cardiology clearance before proceeding with PD catheter placement.   - holding losartan due to hypotension and acute kidney injury -Discussed with patient that overall prognosis is very poor and dialysis may not be efficient with current cardiac concerns. Discussed this with Pam over the telephone as well. Explained the effects of dialysis on the heart muscle and in the weakened state of patient's muscle, he may not tolerate it.  - Will continue Lasix at this time due to edema - Recommend Palliative consult to discuss goals of care   2. Hypotension: with acute exacerbation of systolic congestive heart failure: echo from 01/31/21 with Ejection Fraction of <20%. Cardiogenic shock. Holding home regimen of losartan and PO  torsemide.   - ECHO on 10/03/21 shows EF <20% with global hypokinesis - IV furosemide continued  3. Anemia with chronic kidney disease: hemoglobin 8.4.Will continue to monitor  4. Hyponatremia: secondary to chronic kidney disease and congestive heart failure.  - Continue Furosemide  5. Secondary Hyperparathyroidism: PTH 110 on 08/31/21. Holding calcitriol.     LOS: 6 Elianne Gubser 1/18/202312:09 PM

## 2021-10-04 NOTE — Progress Notes (Signed)
Neuro: intermittent confusion, alert to person, place. Disoriented to time and situation Resp: 2L [place] Prn, stats stable on RA Cardio: NSR GI/GU: BM 10-03-21 Skin: scattered bruising, left elbow +3 edema, Lower extremity edema +2 Psych: anxious and cooperative  Events: daughter and daughter in law updated at bedside by nursing, nephrology and MD

## 2021-10-04 NOTE — Progress Notes (Signed)
BP has consistently been low.  Current BP 79/54.  Dr. Merrilee Jansky aware.  Information acknowledged and as long as patients mentation does not change and he maintains MAP greater than 55 he would like to continue to monitor patient.

## 2021-10-04 NOTE — Progress Notes (Signed)
Nutrition Follow-up  DOCUMENTATION CODES:   Not applicable  INTERVENTION:   -D/c Ensure ENlive -Boost Breeze po TID, each supplement provides 250 kcal and 9 grams of protein  -Continue MVI with minerals daily  NUTRITION DIAGNOSIS:   Increased nutrient needs related to chronic illness (CHF) as evidenced by estimated needs.  Ongoing  GOAL:   Patient will meet greater than or equal to 90% of their needs  Progressing   MONITOR:   PO intake, Supplement acceptance, Labs, Weight trends, I & O's, Skin  REASON FOR ASSESSMENT:   Malnutrition Screening Tool    ASSESSMENT:   88 y.o male with significant PMH of  CKD stage IV, hypertension, proteinuria, hyperparathyroidism due to renal insufficiency, hyperlipidemia, aspiration pneumonia, Systolic HF EF of 99-83%, ischemic cardiomyopathy, bilateral carotid stenosis, severe aortic stenosis, AAA, MGUS, paroxysmal SVT, CABG status post LIMA to LAD, SVG to OM1, OM2, D1, PDA, status post bioprosthetic valve replacement in 2011 who presented to the ED with unresponsiveness.  Reviewed I/O's: -973 ml x 24 hours and -5.1 L since admission  UOP: 1.2 L x 24 hours   Pt unavailable at time of visit. Attempted to speak with pt via call to hospital room phone, however, unable to reach.   Pt with good appetite. Noted meal completion 25-80%. Pt refusing Ensure supplements.   Medications reviewed and include lasix.   Palliative care following; plan for goals of care meeting today.   Labs reviewed: Na: 134.    Diet Order:   Diet Order             Diet 2 gram sodium Room service appropriate? Yes; Fluid consistency: Thin  Diet effective now                   EDUCATION NEEDS:   Education needs have been addressed  Skin:  Skin Assessment: Skin Integrity Issues: Skin Integrity Issues:: Stage II Stage II: bilateral buttocks  Last BM:  10/04/21  Height:   Ht Readings from Last 1 Encounters:  09/29/21 6' (1.829 m)    Weight:    Wt Readings from Last 1 Encounters:  10/04/21 75.4 kg    Ideal Body Weight:  80.9 kg  BMI:  Body mass index is 22.54 kg/m.  Estimated Nutritional Needs:   Kcal:  2150-2350  Protein:  100-115 grams  Fluid:  > 2 L    Loistine Chance, RD, LDN, [place] Registered Dietitian II Certified Diabetes Care and Education Specialist Please refer to [facility] for RD and/or RD on-call/weekend/after hours pager

## 2021-10-05 DIAGNOSIS — R570 Cardiogenic shock: Secondary | ICD-10-CM | POA: Diagnosis not present

## 2021-10-05 DIAGNOSIS — N189 Chronic kidney disease, unspecified: Secondary | ICD-10-CM | POA: Diagnosis not present

## 2021-10-05 DIAGNOSIS — R601 Generalized edema: Secondary | ICD-10-CM | POA: Diagnosis not present

## 2021-10-05 DIAGNOSIS — I5023 Acute on chronic systolic (congestive) heart failure: Secondary | ICD-10-CM | POA: Diagnosis not present

## 2021-10-05 LAB — CBC
HCT: 25.1 % — ABNORMAL LOW (ref 39.0–52.0)
Hemoglobin: 8.7 g/dL — ABNORMAL LOW (ref 13.0–17.0)
MCH: 35.5 pg — ABNORMAL HIGH (ref 26.0–34.0)
MCHC: 34.7 g/dL (ref 30.0–36.0)
MCV: 102.4 fL — ABNORMAL HIGH (ref 80.0–100.0)
Platelets: 101 10*3/uL — ABNORMAL LOW (ref 150–400)
RBC: 2.45 MIL/uL — ABNORMAL LOW (ref 4.22–5.81)
RDW: 17.5 % — ABNORMAL HIGH (ref 11.5–15.5)
WBC: 6.2 10*3/uL (ref 4.0–10.5)
nRBC: 0.5 % — ABNORMAL HIGH (ref 0.0–0.2)

## 2021-10-05 LAB — BASIC METABOLIC PANEL
Anion gap: 11 (ref 5–15)
BUN: 142 mg/dL — ABNORMAL HIGH (ref 8–23)
CO2: 20 mmol/L — ABNORMAL LOW (ref 22–32)
Calcium: 7.8 mg/dL — ABNORMAL LOW (ref 8.9–10.3)
Chloride: 107 mmol/L (ref 98–111)
Creatinine, Ser: 4.82 mg/dL — ABNORMAL HIGH (ref 0.61–1.24)
GFR, Estimated: 11 mL/min — ABNORMAL LOW (ref >60)
Glucose, Bld: 100 mg/dL — ABNORMAL HIGH (ref 70–99)
Potassium: 3.9 mmol/L (ref 3.5–5.1)
Sodium: 138 mmol/L (ref 135–145)

## 2021-10-05 LAB — GLUCOSE, CAPILLARY: Glucose-Capillary: 135 mg/dL — ABNORMAL HIGH (ref 70–99)

## 2021-10-05 LAB — TSH: TSH: 84 u[IU]/mL — ABNORMAL HIGH (ref 0.350–4.500)

## 2021-10-05 NOTE — Progress Notes (Signed)
Palliative Care Progress Note, Assessment & Plan   Patient Name: [name]       Date: 10/05/2021 DOB: 01-May-1931  Age: 88 y.o. MRN#: [id number] Attending Physician: Kayleen Memos, DO Primary Care Physician: Baxter Hire, MD Admit Date: 09/28/2021  Reason for Consultation/Follow-up: Establishing goals of care  Subjective: Patient is lying in bed.  He does not acknowledge my presence.  He does not open his eyes.  After light touch patient is arousable.  He says hello and that he can hear me.  He denies being in any pain.  No family is at bedside.  HPI: 88 y.o. male  with past medical history of CKD (stage IV), HTN, hyperparathyroidism (D/T renal insufficiency), HLD, aspiration pneumonia, systolic HF (EF 15 -94%), ischemic cardiomyopathy, bilateral carotid stenosis, severe aortic stenosis, AAA, MGUS, paroxysmal SVT, CABG (s/p LIMA to LAD, s/p bioprosthetic valve replacement-2011) admitted on 09/28/2021 with bradycardia (heart rate in the 20s) and hypotension.  Patient being treated for heart failure exacerbation in setting of myexedema coma.    Of note, MOST form completed 02/02/21 and available for [facility] docs. Pt has elected DNR, limited additional interventions, determine use or limitation of antibiotics when infection occurs, IV fluids for a defined trial period, and no feeding tube.  Summary of counseling/coordination of care: After reviewing the patient's chart, I spoke with the patient's daughter [name].  Introduced palliative medicine and specialized medical care for people living with serious illness.  I shared that PMT focuses on the relief of symptoms as well as discussion of advanced care planning.  The goal is to improve the quality of life for both the patient and the family.  Pam  shared she had research and read articles regarding palliative and that she did not think her father needed to have palliative care at this time.  Pam shared that she did not want the patient to get any medicine or treatment for pain since he was having none of these issues.  Therapeutic silence and active listening provided for Pam to share thoughts and emotions regarding her father's current health status.  Pam shared that her father's kidneys are holding their own and that the main issue is his heart.  She shared she would like for him to be reevaluated for physical therapy as well as occupational therapy.  Discussed with Pam the need to have continuing discussions with the patient regarding his overall plan of care and treatment options while also keeping in mind that medical decisions are made within the context of what the patient's values and goals of care are.  I shared with Pam that I would be speaking with the patient and asking him what his goals of care and what is important to him.  She shared the patient is confused.  After speaking with Pam I met with the patient at bedside.  He was easily arousable but did not open his eyes during our discussion.  We discussed a brief life review.  He shared that he met his wife on a blind date and married her shortly thereafter.  He also shared that he worked in [employer] most of his life.  During our discussion  he became somnolent and started to snore.  I shared that since he had moved out of the ICU to a regular floor that he was 1 step closer to going home.  Patient shared he did not think he was any closer to going home.  I asked him to elaborate.  Patient remains silent.  I attempted to elicit goals important to the patient.  Again, he remains silent.  After my visit with the patient I spoke with the patient's son Fritz Pickerel and his wife Butch Penny over the phone.  Both family member shared they understand that the patient has no further appropriate,  available medical treatments for his kidney and heart conditions.  They are both in agreement to move forward with keeping the patient comfortable and focusing on his quality of life.  With no HC POA named, both son and daughter will need to make decisions for the patient moving forward in the event he is unable to make his own decisions.  I shared my concern with Fritz Pickerel and Butch Penny that Pam has a very different plan of care in mind for the patient.  Fritz Pickerel and Butch Penny shared they were going to have a frank discussion with the patient and speak with Pam today regarding the patient's bigger picture and overall goals of care.  Palliative medicine team will continue to be available to the patient and his family throughout his hospitalization.  Questions and concerns were addressed.  Code Status: Limited code  Prognosis: Unable to determine  Discharge Planning: To Be Determined  Recommendations/Plan: Partial code remains Continue discussion and education regarding patient's current poor prognosis  Care plan was discussed with patient, patient's son and his wife, patient's daughter, Dr. Nevada Crane, [facility] Liaison Sonia Baller, [facility] SW Caryl Pina, RN Vickie  Physical Exam Vitals and nursing note reviewed.  Constitutional:      General: He is not in acute distress.    Appearance: Normal appearance. He is not ill-appearing or toxic-appearing.  HENT:     Head: Normocephalic and atraumatic.     Mouth/Throat:     Mouth: Mucous membranes are moist.  Cardiovascular:     Rate and Rhythm: Normal rate.  Pulmonary:     Effort: Pulmonary effort is normal.  Musculoskeletal:     Comments: Generalized weakness  Skin:    General: Skin is warm and dry.  Neurological:     Mental Status: Mental status is at baseline.  Psychiatric:        Mood and Affect: Mood normal.            Palliative Assessment/Data: 30%    Total Time 35 minutes  Greater than 50%  of this time was spent counseling and coordinating care  related to the above assessment and plan.  Thank you for allowing the Palliative Medicine Team to assist in the care of this patient.  Glenmora Ilsa Iha, FNP-BC Palliative Medicine Team Team Phone # [phone]

## 2021-10-05 NOTE — Progress Notes (Signed)
[facility] for Electrolyte Monitoring and Replacement   Recent Labs: Potassium (mmol/L)  Date Value  10/05/2021 3.9  12/09/2014 4.7   Magnesium (mg/dL)  Date Value  10/02/2021 2.4   Calcium (mg/dL)  Date Value  10/05/2021 7.8 (L)   Calcium, Total (mg/dL)  Date Value  12/09/2014 8.5 (L)   Albumin (g/dL)  Date Value  10/01/2021 3.0 (L)   Phosphorus (mg/dL)  Date Value  10/02/2021 7.0 (H)   Sodium (mmol/L)  Date Value  10/05/2021 138  12/09/2014 126 (L)   Assessment: 88 year old male presented with bradycardia PTA, initially being paced on arrival. Labs revealed significantly elevated TSH and low T4. Patient followed by Nephrology outpatient, awaiting PD catheter placement. Cardiology also consulted, patient with HFrEF. Patient was started on Lasix infusion for volume overload but currently on 60 mg IV q12h. Pharmacy consult to follow and replace electrolytes.   Goal of Therapy:  Electrolytes within normal limits  Plan:  No electrolyte replacement indicated at this time. Pharmacy will sign off at this time. Please re-consult if needed.    Oswald Hillock, PharmD  10/05/2021 9:03 AM

## 2021-10-05 NOTE — Progress Notes (Signed)
[facility]  ROUNDING NOTE   Subjective:   Patient sitting up preparing for breakfast Appears somber today Denies shortness of breath   UOP 96ml  Creatinine 4.82   Objective:  Vital signs in last 24 hours:  Temp:  [97.4 F (36.3 C)-99.5 F (37.5 C)] 97.5 F (36.4 C) (01/19 0729) Pulse Rate:  [64-71] 71 (01/19 0421) Resp:  [17-22] 20 (01/19 0729) BP: (79-101)/(54-72) 97/66 (01/19 0729) SpO2:  [96 %-100 %] 100 % (01/19 0729) Weight:  [74 kg] 74 kg (01/19 0729)  Weight change:  Filed Weights   10/03/21 0500 10/04/21 0500 10/05/21 0729  Weight: 75.3 kg 75.4 kg 74 kg    Intake/Output: I/O last 3 completed shifts: In: 850 [P.O.:850] Out: 1600 [Urine:1600]   Intake/Output this shift:  Total I/O In: 360 [P.O.:360] Out: -   Physical Exam: General: NAD, sitting up in bed  Head: Normocephalic, atraumatic. Moist oral mucosal membranes  Eyes: Anicteric  Neck: Supple  Lungs:  Basilar rales, normal effort, 2L [place]  Heart: regular  Abdomen:  Soft, nontender, bowel sounds present  Extremities: 1+ lower extremity edema.2+ BUE edema  Neurologic: Awake, alert, following commands  Skin: No acute rash, generalized bruising  Access: NONE    Basic Metabolic Panel: Recent Labs  Lab 09/29/21 0520 09/29/21 2017 09/30/21 0549 10/01/21 0411 10/02/21 0411 10/02/21 1730 10/03/21 0523 10/04/21 0404 10/05/21 0610  NA 133*  --  132* 132* 132* 133* 134* 134* 138  K 4.2   < > 3.9 3.6 4.2 4.0 4.3 4.2 3.9  CL 104  --  102 102 103 104 103 104 107  CO2 18*  --  18* 18* 19* 19* 19* 20* 20*  GLUCOSE 117*  --  121* 112* 90 143* 107* 100* 100*  BUN 127*  --  126* 116* 134* 125* 130* 140* 142*  CREATININE 4.69*  --  4.47* 4.49* 4.99* 4.78* 4.81* 4.97* 4.82*  CALCIUM 8.0*  --  8.0* 8.1* 7.9* 7.6* 7.8* 7.8* 7.8*  MG 2.4  --  2.4 2.3 2.4  --   --   --   --   PHOS 6.6*  --  6.4* 6.4* 7.0*  --   --   --   --    < > = values in this interval not displayed.     Liver  Function Tests: Recent Labs  Lab 09/28/21 2147 10/01/21 0411  AST 33  --   ALT 20  --   ALKPHOS 63  --   BILITOT 1.0  --   PROT 6.6  --   ALBUMIN 3.2* 3.0*    No results for input(s): LIPASE, AMYLASE in the last 168 hours. No results for input(s): AMMONIA in the last 168 hours.  CBC: Recent Labs  Lab 09/28/21 2015 09/29/21 0520 10/01/21 0411 10/02/21 0411 10/03/21 0523 10/04/21 0404 10/05/21 0610  WBC 4.9   < > 6.9 6.5 6.5 6.2 6.2  NEUTROABS 2.9  --   --   --   --   --   --   HGB 8.7*   < > 9.0* 8.0* 8.3* 8.4* 8.7*  HCT 26.4*   < > 26.2* 22.8* 24.7* 24.4* 25.1*  MCV 105.6*   < > 101.2* 100.9* 101.2* 101.2* 102.4*  PLT 134*   < > 143* 109* 108* 102* 101*   < > = values in this interval not displayed.     Cardiac Enzymes: No results for input(s): CKTOTAL, CKMB, CKMBINDEX, TROPONINI in the last 168  hours.  BNP: Invalid input(s): POCBNP  CBG: Recent Labs  Lab 09/29/21 0231  BTDVVO 16     Microbiology: Results for orders placed or performed during the hospital encounter of 09/28/21  Resp Panel by RT-PCR (Flu A&B, Covid) Nasopharyngeal Swab     Status: None   Collection Time: 09/28/21  8:19 PM   Specimen: Nasopharyngeal Swab; Nasopharyngeal(NP) swabs in vial transport medium  Result Value Ref Range Status   SARS Coronavirus 2 by RT PCR NEGATIVE NEGATIVE Final    Comment: (NOTE) SARS-CoV-2 target nucleic acids are NOT DETECTED.  The SARS-CoV-2 RNA is generally detectable in upper respiratory specimens during the acute phase of infection. The lowest concentration of SARS-CoV-2 viral copies this assay can detect is 138 copies/mL. A negative result does not preclude SARS-Cov-2 infection and should not be used as the sole basis for treatment or other patient management decisions. A negative result may occur with  improper specimen collection/handling, submission of specimen other than nasopharyngeal swab, presence of viral mutation(s) within the areas targeted  by this assay, and inadequate number of viral copies(<138 copies/mL). A negative result must be combined with clinical observations, patient history, and epidemiological information. The expected result is Negative.  Fact Sheet for Patients:  EntrepreneurPulse.com.au  Fact Sheet for Healthcare Providers:  IncredibleEmployment.be  This test is no t yet approved or cleared by the [place] FDA and  has been authorized for detection and/or diagnosis of SARS-CoV-2 by FDA under an Emergency Use Authorization (EUA). This EUA will remain  in effect (meaning this test can be used) for the duration of the COVID-19 declaration under Section 564(b)(1) of the Act, 21 U.S.C.section 360bbb-3(b)(1), unless the authorization is terminated  or revoked sooner.       Influenza A by PCR NEGATIVE NEGATIVE Final   Influenza B by PCR NEGATIVE NEGATIVE Final    Comment: (NOTE) The Xpert Xpress SARS-CoV-2/FLU/RSV plus assay is intended as an aid in the diagnosis of influenza from Nasopharyngeal swab specimens and should not be used as a sole basis for treatment. Nasal washings and aspirates are unacceptable for Xpert Xpress SARS-CoV-2/FLU/RSV testing.  Fact Sheet for Patients: EntrepreneurPulse.com.au  Fact Sheet for Healthcare Providers: IncredibleEmployment.be  This test is not yet approved or cleared by the [place] FDA and has been authorized for detection and/or diagnosis of SARS-CoV-2 by FDA under an Emergency Use Authorization (EUA). This EUA will remain in effect (meaning this test can be used) for the duration of the COVID-19 declaration under Section 564(b)(1) of the Act, 21 U.S.C. section 360bbb-3(b)(1), unless the authorization is terminated or revoked.  Performed at [facility], [address] [place], [place] [address]   Culture, blood (routine x 2)     Status: None   Collection Time:  09/28/21  9:47 PM   Specimen: BLOOD  Result Value Ref Range Status   Specimen Description BLOOD LEFT FOREARM  Final   Special Requests   Final    BOTTLES DRAWN AEROBIC AND ANAEROBIC Blood Culture adequate volume   Culture   Final    NO GROWTH 5 DAYS Performed at [facility], [address] [place], [place] [address]    Report Status 10/03/2021 FINAL  Final  Culture, blood (routine x 2)     Status: None   Collection Time: 09/28/21  9:47 PM   Specimen: BLOOD  Result Value Ref Range Status   Specimen Description BLOOD LEFT FOREARM  Final   Special Requests IN PEDIATRIC BOTTLE Blood Culture  adequate volume  Final   Culture   Final    NO GROWTH 5 DAYS Performed at [facility], [address] [place], [place] [address]    Report Status 10/03/2021 FINAL  Final  MRSA Next Gen by PCR, Nasal     Status: Abnormal   Collection Time: 09/29/21  2:38 AM   Specimen: Nasal Mucosa; Nasal Swab  Result Value Ref Range Status   MRSA by PCR Next Gen DETECTED (A) NOT DETECTED Final    Comment: RESULT CALLED TO, READ BACK BY AND VERIFIED WITH: Hansel Starling @0353  on 09/29/21 SKL (NOTE) The GeneXpert MRSA Assay (FDA approved for NASAL specimens only), is one component of a comprehensive MRSA colonization surveillance program. It is not intended to diagnose MRSA infection nor to guide or monitor treatment for MRSA infections. Test performance is not FDA approved in patients less than 29 years old. Performed at [facility], [address] [place], [place] [address]     Coagulation Studies: No results for input(s): LABPROT, INR in the last 72 hours.   Urinalysis: No results for input(s): COLORURINE, LABSPEC, PHURINE, GLUCOSEU, HGBUR, BILIRUBINUR, KETONESUR, PROTEINUR, UROBILINOGEN, NITRITE, LEUKOCYTESUR in the last 72 hours.  Invalid input(s): APPERANCEUR     Imaging: No results found.   Medications:    sodium chloride Stopped (09/29/21 2200)     Chlorhexidine Gluconate Cloth  6 each Topical Q0600   feeding supplement  1 Container Oral TID BM   furosemide  60 mg Intravenous Q12H   heparin  5,000 Units Subcutaneous Q8H   levothyroxine  75 mcg Oral Q0600   midodrine  10 mg Oral TID WC   multivitamin with minerals  1 tablet Oral Daily   mupirocin ointment   Nasal BID   docusate sodium, polyethylene glycol  Assessment/ Plan:   [name] is a 88 y.o. 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 Started on IV levothyroxine and now on levothyroxine 25mg  daily. Also on stress dose steroids.   1. Acute kidney injury on Chronic kidney disease stage V: baseline creatinine of 3.9 on 09/04/21. Patient with chronic kidney disease secondary to hypertension and vascular disease. Acute kidney injury secondary to acute exacerbation of chronic systolic congestive heart failure: Family was interested in peritoneal dialysis prior to admission. Needed cardiology clearance before proceeding with PD catheter placement.   - holding losartan due to hypotension and acute kidney injury -Creatinine stable, UOP 973ml recorded in 24 hours.  - Continue Lasix - Not a dialysis candidate, due to weakened cardiac state - Recommend Palliative consult to discuss goals of care   2. Hypotension: with acute exacerbation of systolic congestive heart failure: echo from 01/31/21 with Ejection Fraction of <20%.  Cardiogenic shock. Holding home regimen of losartan and PO torsemide.   - ECHO on 10/03/21 shows EF <20% with global hypokinesis - Continue diuresis  3. Anemia with chronic kidney disease: hemoglobin 8.7.Will continue to monitor  4. Hyponatremia: secondary to chronic kidney disease and congestive heart failure.  - Continue Furosemide  5. Secondary Hyperparathyroidism: PTH 110 on 08/31/21.  Holding calcitriol.     LOS: 7 [place] 1/19/202310:40 AM

## 2021-10-05 NOTE — Progress Notes (Signed)
PROGRESS NOTE  [name] SNK:[id number] DOB: 05-12-1931 DOA: 09/28/2021 PCP: Baxter Hire, MD  HPI/Recap of past 24 hours: 88 yr. male who presented to the ED with unresponsiveness. Per ED reports, patient was found in a chair at his home with heart rate in the 20s and BP was 80/60.  EMS initiated pacing at 70 bpm and 110 mA following administration of 2 mg of IV Versed.  He was also started on Levophed drip for hypotension and transported to the ED.   ED Course: On arrival to the ED, he was initially somnolent but arousable to voice and complaining of chest pain where he is being shocked.  Pacing was stopped and patient was noted to have a heart rate in the 50s consistent with junctional bradycardia.  He was hypothermic 94.5 F (34.7 C) with blood pressure 81/51 mm Hg and pulse rate 51 beats/min., RR 21 and oxygen saturation of 100% on 15 L. Chest X-ray>Cardiomegaly with bilateral pleural effusions and bibasilar Opacities. Pertinent Labs in Findings: Na+/K+:130/4.3, Glucose: 120, BUN/Cr:133/4.69, Calcium: 7.9, WBC:4.9, Hgb/Hct:8.7/26.4, PCT: Negative<0.10, Lactic acid: 1.8, COVID PCR: Negative, Troponin: 40, BNP: 2137, TSH: 213. Given worsening hypotension and suspected cardiogenic shock, patient started on Levophed. PCCM consulted.   Pertinent  Medical History  CKD stage IV, hypertension, proteinuria, hyperparathyroidism due to renal insufficiency, hyperlipidemia, aspiration pneumonia, Systolic HF EF of 93-79%, ischemic cardiomyopathy, bilateral carotid stenosis, severe aortic stenosis, AAA, MGUS, paroxysmal SVT, CABG status post LIMA to LAD, SVG to OM1, OM2, D1, PDA, status post bioprosthetic valve replacement in 2011   [place] [facility] Events: Including procedures, antibiotic start and stop dates in addition to other pertinent events   1/13: Admitted to ICU with Acute on Chronic CHF exacerbation in the setting of severe hypothyroidism 1/14: Started on Lasix gtt 1/15: Remains on  Lasix gtt and Levo   Cultures:  1/12: SARS-CoV-2 PCR> negative 1/12: Influenza PCR> negative 1/12: Blood culture x2> 1/12: Urine Culture> 1/12: MRSA PCR>> POSITIVE 1/12: Strep pneumo urinary antigen> 1/12: Legionella urinary antigen> Antimicrobials:  Vancomycin 1/13>STOPPED Cefazolin 1/13> STOPPED   Interim History / Subjective:  PCCM pickup from 10/05/2021.  Patient was seen and examined at his bedside.  He has no new complaints.  There were no acute events overnight.  He was seen by palliative care team.  Ongoing goals of care discussions.  Assessment/Plan: Principal Problem:   Cardiogenic shock ([place])  Acute hypoxic respiratory failure secondary to pulmonary edema in the setting of advanced heart failure. Maintain O2 saturation greater than 92% Personally reviewed chest x-ray which shows bilateral pulmonary infiltrates, increase in pulmonary vascularity with cardiomegaly with concern for pulmonary edema. Ongoing diuresing, continue Continue to closely monitor on progressive unit Wean off oxygen supplementation as tolerated  Acute on chronic systolic CHF Last 2D echo LVEF less than 20% Seen by cardiology no further work-up planned Also seen by palliative care team, ongoing goals of care discussions Strict I's and O's and daily weight  Elevated troponin, suspect demand ischemia in the setting of hypoxia Denies any anginal symptoms at the time of this visit. Continue to monitor on telemetry  Myxedema coma Patient presented with unresponsiveness, severe bradycardia requiring external pacing, hypotension on Levophed and severe hypothermia found to be in severe hypothyroidism. TSH 213, free T4 less than 0.25. Free T3 0.7 Status post IV Synthroid 200 mcg x1 Synthroid dose increased to 75 mcg 10/02/2021 Trend TSH and hold steroids as recommended by PCCM  Paroxysmal A. fib Amiodarone is on hold. Not  anticoagulated due to history of significant GI bleed and anemia  AKI on  CKD 4, likely multifactorial secondary to severe hypotension requiring vasopressor, cardiorenal syndrome, venous congestion with con commitment ACE inhibitor, diuretics and NSAID use. Continue to monitor renal function and urine output Seen by nephrology, not a candidate for long-term hemodialysis. Baseline creatinine 3.75 with GFR of 15 Creatinine uptrending 4.97 from 4.81.  Hypotension, improved Continue midodrine 10 mg 3 times daily. Maintain MAP greater than 65  Physical debility PT OT to assess Fall precautions  Goals of care DO NOT INTUBATE Ongoing goals of care discussions with palliative care team.   Critical care time: 65 minutes.    Code Status: DO NOT INTUBATE  Family Communication: None at bedside  Disposition Plan: Likely will discharge to home with home health services   Consultants: Cardiology PCCM Nephrology  Procedures: Foley placement Central line placement  Antimicrobials: None  DVT prophylaxis: Subcu heparin 3 times daily  Status is: Inpatient  Patient requires at least 2 midnights for further evaluation and treatment of present condition.      Objective: Vitals:   10/05/21 0421 10/05/21 0729 10/05/21 1240 10/05/21 1446  BP: 92/64 97/66 90/64  96/67  Pulse: 71  64 65  Resp: 20 20 18 17   Temp: (!) 97.4 F (36.3 C) (!) 97.5 F (36.4 C) 97.9 F (36.6 C) 98.1 F (36.7 C)  TempSrc: Oral Oral Oral   SpO2: 100% 100% 99% 99%  Weight:  74 kg    Height:        Intake/Output Summary (Last 24 hours) at 10/05/2021 1509 Last data filed at 10/05/2021 1330 Gross per 24 hour  Intake 970 ml  Output 1700 ml  Net -730 ml   Filed Weights   10/03/21 0500 10/04/21 0500 10/05/21 0729  Weight: 75.3 kg 75.4 kg 74 kg    Exam:  General: 88 y.o. year-old male well developed well nourished in no acute distress.  Alert and oriented x3. Cardiovascular: Regular rate and rhythm with no rubs or gallops.  No thyromegaly or JVD noted.   Respiratory:  Mild rales at bases no wheezing noted.  Poor inspiratory effort. Abdomen: Soft nontender nondistended with normal bowel sounds x4 quadrants. Musculoskeletal: Trace lower extremity edema bilaterally.  Skin: No ulcerative lesions noted or rashes, Psychiatry: Mood is appropriate for condition and setting Neuro: Moves all 4 extremities.   Data Reviewed: CBC: Recent Labs  Lab 09/28/21 2015 09/29/21 0520 10/01/21 0411 10/02/21 0411 10/03/21 0523 10/04/21 0404 10/05/21 0610  WBC 4.9   < > 6.9 6.5 6.5 6.2 6.2  NEUTROABS 2.9  --   --   --   --   --   --   HGB 8.7*   < > 9.0* 8.0* 8.3* 8.4* 8.7*  HCT 26.4*   < > 26.2* 22.8* 24.7* 24.4* 25.1*  MCV 105.6*   < > 101.2* 100.9* 101.2* 101.2* 102.4*  PLT 134*   < > 143* 109* 108* 102* 101*   < > = values in this interval not displayed.   Basic Metabolic Panel: Recent Labs  Lab 09/29/21 0520 09/29/21 2017 09/30/21 0549 10/01/21 0411 10/02/21 0411 10/02/21 1730 10/03/21 0523 10/04/21 0404 10/05/21 0610  NA 133*  --  132* 132* 132* 133* 134* 134* 138  K 4.2   < > 3.9 3.6 4.2 4.0 4.3 4.2 3.9  CL 104  --  102 102 103 104 103 104 107  CO2 18*  --  18* 18* 19* 19* 19* 20*  20*  GLUCOSE 117*  --  121* 112* 90 143* 107* 100* 100*  BUN 127*  --  126* 116* 134* 125* 130* 140* 142*  CREATININE 4.69*  --  4.47* 4.49* 4.99* 4.78* 4.81* 4.97* 4.82*  CALCIUM 8.0*  --  8.0* 8.1* 7.9* 7.6* 7.8* 7.8* 7.8*  MG 2.4  --  2.4 2.3 2.4  --   --   --   --   PHOS 6.6*  --  6.4* 6.4* 7.0*  --   --   --   --    < > = values in this interval not displayed.   GFR: Estimated Creatinine Clearance: 11.1 mL/min (A) (by C-G formula based on SCr of 4.82 mg/dL (H)). Liver Function Tests: Recent Labs  Lab 09/28/21 2147 10/01/21 0411  AST 33  --   ALT 20  --   ALKPHOS 63  --   BILITOT 1.0  --   PROT 6.6  --   ALBUMIN 3.2* 3.0*   No results for input(s): LIPASE, AMYLASE in the last 168 hours. No results for input(s): AMMONIA in the last 168  hours. Coagulation Profile: Recent Labs  Lab 09/29/21 0115  INR 1.3*   Cardiac Enzymes: No results for input(s): CKTOTAL, CKMB, CKMBINDEX, TROPONINI in the last 168 hours. BNP (last 3 results) No results for input(s): PROBNP in the last 8760 hours. HbA1C: No results for input(s): HGBA1C in the last 72 hours. CBG: Recent Labs  Lab 09/29/21 0231  GLUCAP 97   Lipid Profile: No results for input(s): CHOL, HDL, LDLCALC, TRIG, CHOLHDL, LDLDIRECT in the last 72 hours. Thyroid Function Tests: Recent Labs    10/05/21 0610  TSH 84.000*   Anemia Panel: No results for input(s): VITAMINB12, FOLATE, FERRITIN, TIBC, IRON, RETICCTPCT in the last 72 hours. Urine analysis:    Component Value Date/Time   COLORURINE YELLOW (A) 09/28/2021 2019   APPEARANCEUR CLEAR (A) 09/28/2021 2019   APPEARANCEUR Clear 11/22/2015 0950   LABSPEC 1.011 09/28/2021 2019   PHURINE 5.0 09/28/2021 2019   GLUCOSEU NEGATIVE 09/28/2021 2019   HGBUR MODERATE (A) 09/28/2021 2019   BILIRUBINUR NEGATIVE 09/28/2021 2019   BILIRUBINUR Negative 11/22/2015 [place] 09/28/2021 2019   PROTEINUR 30 (A) 09/28/2021 2019   UROBILINOGEN 1.0 02/16/2010 1336   NITRITE NEGATIVE 09/28/2021 2019   LEUKOCYTESUR NEGATIVE 09/28/2021 2019   Sepsis Labs: @LABRCNTIP (procalcitonin:4,lacticidven:4)  ) Recent Results (from the past 240 hour(s))  Resp Panel by RT-PCR (Flu A&B, Covid) Nasopharyngeal Swab     Status: None   Collection Time: 09/28/21  8:19 PM   Specimen: Nasopharyngeal Swab; Nasopharyngeal(NP) swabs in vial transport medium  Result Value Ref Range Status   SARS Coronavirus 2 by RT PCR NEGATIVE NEGATIVE Final    Comment: (NOTE) SARS-CoV-2 target nucleic acids are NOT DETECTED.  The SARS-CoV-2 RNA is generally detectable in upper respiratory specimens during the acute phase of infection. The lowest concentration of SARS-CoV-2 viral copies this assay can detect is 138 copies/mL. A negative result does  not preclude SARS-Cov-2 infection and should not be used as the sole basis for treatment or other patient management decisions. A negative result may occur with  improper specimen collection/handling, submission of specimen other than nasopharyngeal swab, presence of viral mutation(s) within the areas targeted by this assay, and inadequate number of viral copies(<138 copies/mL). A negative result must be combined with clinical observations, patient history, and epidemiological information. The expected result is Negative.  Fact Sheet for Patients:  EntrepreneurPulse.com.au  Fact Sheet for Healthcare Providers:  IncredibleEmployment.be  This test is no t yet approved or cleared by the [place] FDA and  has been authorized for detection and/or diagnosis of SARS-CoV-2 by FDA under an Emergency Use Authorization (EUA). This EUA will remain  in effect (meaning this test can be used) for the duration of the COVID-19 declaration under Section 564(b)(1) of the Act, 21 U.S.C.section 360bbb-3(b)(1), unless the authorization is terminated  or revoked sooner.       Influenza A by PCR NEGATIVE NEGATIVE Final   Influenza B by PCR NEGATIVE NEGATIVE Final    Comment: (NOTE) The Xpert Xpress SARS-CoV-2/FLU/RSV plus assay is intended as an aid in the diagnosis of influenza from Nasopharyngeal swab specimens and should not be used as a sole basis for treatment. Nasal washings and aspirates are unacceptable for Xpert Xpress SARS-CoV-2/FLU/RSV testing.  Fact Sheet for Patients: EntrepreneurPulse.com.au  Fact Sheet for Healthcare Providers: IncredibleEmployment.be  This test is not yet approved or cleared by the [place] FDA and has been authorized for detection and/or diagnosis of SARS-CoV-2 by FDA under an Emergency Use Authorization (EUA). This EUA will remain in effect (meaning this test can be used) for the  duration of the COVID-19 declaration under Section 564(b)(1) of the Act, 21 U.S.C. section 360bbb-3(b)(1), unless the authorization is terminated or revoked.  Performed at [facility], [address] [place], [place] [address]   Culture, blood (routine x 2)     Status: None   Collection Time: 09/28/21  9:47 PM   Specimen: BLOOD  Result Value Ref Range Status   Specimen Description BLOOD LEFT FOREARM  Final   Special Requests   Final    BOTTLES DRAWN AEROBIC AND ANAEROBIC Blood Culture adequate volume   Culture   Final    NO GROWTH 5 DAYS Performed at [facility], [address] [place], [place] [address]    Report Status 10/03/2021 FINAL  Final  Culture, blood (routine x 2)     Status: None   Collection Time: 09/28/21  9:47 PM   Specimen: BLOOD  Result Value Ref Range Status   Specimen Description BLOOD LEFT FOREARM  Final   Special Requests IN PEDIATRIC BOTTLE Blood Culture adequate volume  Final   Culture   Final    NO GROWTH 5 DAYS Performed at [facility], [address], [place], [place] [address]    Report Status 10/03/2021 FINAL  Final  MRSA Next Gen by PCR, Nasal     Status: Abnormal   Collection Time: 09/29/21  2:38 AM   Specimen: Nasal Mucosa; Nasal Swab  Result Value Ref Range Status   MRSA by PCR Next Gen DETECTED (A) NOT DETECTED Final    Comment: RESULT CALLED TO, READ BACK BY AND VERIFIED WITH: Hansel Starling @0353  on 09/29/21 SKL (NOTE) The GeneXpert MRSA Assay (FDA approved for NASAL specimens only), is one component of a comprehensive MRSA colonization surveillance program. It is not intended to diagnose MRSA infection nor to guide or monitor treatment for MRSA infections. Test performance is not FDA approved in patients less than 56 years old. Performed at [facility], [address], [place], [place] [address]       Studies: No results found.  Scheduled Meds:  Chlorhexidine Gluconate Cloth  6 each  Topical Q0600   feeding supplement  1 Container Oral TID BM   furosemide  60 mg Intravenous Q12H   heparin  5,000 Units Subcutaneous Q8H  levothyroxine  75 mcg Oral Q0600   midodrine  10 mg Oral TID WC   multivitamin with minerals  1 tablet Oral Daily   mupirocin ointment   Nasal BID    Continuous Infusions:  sodium chloride Stopped (09/29/21 2200)     LOS: 7 days     Kayleen Memos, MD [facility] Pager [phone]  If 7PM-7AM, please contact night-coverage [email or web address] Password [facility] 10/05/2021, 3:09 PM

## 2021-10-05 NOTE — Plan of Care (Signed)
°  Problem: Education: Goal: Knowledge of General Education information will improve Description: Including pain rating scale, medication(s)/side effects and non-pharmacologic comfort measures Outcome: Progressing   Problem: Clinical Measurements: Goal: Respiratory complications will improve Outcome: Progressing   Problem: Clinical Measurements: Goal: Cardiovascular complication will be avoided Outcome: Progressing   Problem: Activity: Goal: Risk for activity intolerance will decrease Outcome: Progressing   Problem: Nutrition: Goal: Adequate nutrition will be maintained Outcome: Progressing   Problem: Coping: Goal: Level of anxiety will decrease Outcome: Progressing   Problem: Elimination: Goal: Will not experience complications related to urinary retention Outcome: Progressing   Problem: Pain Managment: Goal: General experience of comfort will improve Outcome: Progressing

## 2021-10-06 ENCOUNTER — Encounter: Payer: Self-pay | Admitting: Family Medicine

## 2021-10-06 DIAGNOSIS — I248 Other forms of acute ischemic heart disease: Secondary | ICD-10-CM

## 2021-10-06 DIAGNOSIS — J9601 Acute respiratory failure with hypoxia: Secondary | ICD-10-CM | POA: Diagnosis not present

## 2021-10-06 DIAGNOSIS — D649 Anemia, unspecified: Secondary | ICD-10-CM

## 2021-10-06 DIAGNOSIS — D638 Anemia in other chronic diseases classified elsewhere: Secondary | ICD-10-CM

## 2021-10-06 DIAGNOSIS — E035 Myxedema coma: Secondary | ICD-10-CM | POA: Diagnosis not present

## 2021-10-06 DIAGNOSIS — I480 Paroxysmal atrial fibrillation: Secondary | ICD-10-CM

## 2021-10-06 DIAGNOSIS — N179 Acute kidney failure, unspecified: Secondary | ICD-10-CM

## 2021-10-06 DIAGNOSIS — I5023 Acute on chronic systolic (congestive) heart failure: Secondary | ICD-10-CM

## 2021-10-06 DIAGNOSIS — I2489 Other forms of acute ischemic heart disease: Secondary | ICD-10-CM

## 2021-10-06 HISTORY — DX: Myxedema coma: E03.5

## 2021-10-06 LAB — BASIC METABOLIC PANEL
Anion gap: 12 (ref 5–15)
BUN: 121 mg/dL — ABNORMAL HIGH (ref 8–23)
CO2: 21 mmol/L — ABNORMAL LOW (ref 22–32)
Calcium: 7.9 mg/dL — ABNORMAL LOW (ref 8.9–10.3)
Chloride: 106 mmol/L (ref 98–111)
Creatinine, Ser: 4.42 mg/dL — ABNORMAL HIGH (ref 0.61–1.24)
GFR, Estimated: 12 mL/min — ABNORMAL LOW (ref >60)
Glucose, Bld: 93 mg/dL (ref 70–99)
Potassium: 3.6 mmol/L (ref 3.5–5.1)
Sodium: 139 mmol/L (ref 135–145)

## 2021-10-06 LAB — CBC
HCT: 26 % — ABNORMAL LOW (ref 39.0–52.0)
Hemoglobin: 9 g/dL — ABNORMAL LOW (ref 13.0–17.0)
MCH: 34.7 pg — ABNORMAL HIGH (ref 26.0–34.0)
MCHC: 34.6 g/dL (ref 30.0–36.0)
MCV: 100.4 fL — ABNORMAL HIGH (ref 80.0–100.0)
Platelets: 110 10*3/uL — ABNORMAL LOW (ref 150–400)
RBC: 2.59 MIL/uL — ABNORMAL LOW (ref 4.22–5.81)
RDW: 17.7 % — ABNORMAL HIGH (ref 11.5–15.5)
WBC: 7.7 10*3/uL (ref 4.0–10.5)
nRBC: 0.3 % — ABNORMAL HIGH (ref 0.0–0.2)

## 2021-10-06 LAB — GLUCOSE, CAPILLARY: Glucose-Capillary: 108 mg/dL — ABNORMAL HIGH (ref 70–99)

## 2021-10-06 LAB — T4, FREE: Free T4: 0.47 ng/dL — ABNORMAL LOW (ref 0.61–1.12)

## 2021-10-06 NOTE — Assessment & Plan Note (Signed)
- 

## 2021-10-06 NOTE — Assessment & Plan Note (Signed)
TSH is dropped by more than half, free T4 almost normal Clinically he is improving - Check T3 - Continue levothyroxine

## 2021-10-06 NOTE — Care Management Important Message (Signed)
Important Message  Patient Details  Name: VICTORINO FATZINGER MRN: [id number] Date of Birth: July 03, 1931   Medicare Important Message Given:  Yes     Juliann Pulse A Herb Beltre 10/06/2021, 3:03 PM

## 2021-10-06 NOTE — Assessment & Plan Note (Signed)
This is improving, and is weaning down to 1.5 L oxygen, does come off of Levophed. - Continue midodrine - Continue Lasix 60 twice daily -Daily BMP -Hold losartan given hypotension

## 2021-10-06 NOTE — Assessment & Plan Note (Signed)
Due to hypothyroidism.  Macrocytic.  Hemoglobin trending up -Continue levothyroxine

## 2021-10-06 NOTE — Progress Notes (Signed)
Progress Note   Patient: [name]:[id number] DOB: 05/06/1931 DOA: 09/28/2021     8 DOS: the patient was seen and examined on 10/06/2021   Brief hospital course: 88 yr. male who presented to the ED with unresponsiveness. Per ED reports, patient was found in a chair at his home with heart rate in the 20s and BP was 80/60.  EMS initiated pacing at 70 bpm and 110 mA following administration of 2 mg of IV Versed.  He was also started on Levophed drip for hypotension and transported to the ED.  On arrival to the ED, he was somnolent, getting externally paced.  ECG showed junctional bradycardia.  Hypothermic, hypotensive, CXR with bilateral effusions and edema.  Cr 4.69 BNP: 2137, TSH: 213.   Started on Levophed and admitted to ICU   Assessment and Plan * 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[place]) TSH is dropped by more than half, free T4 almost normal Clinically he is improving - Check T3 - Continue levothyroxine  Cardiogenic shock (H[facility])- (present on admission) - Hold losartan given hypotension  Acute on chronic systolic CHF (congestive heart failure) ([place])- (present on admission) This is improving, and is weaning down to 1.5 L oxygen, does come off of Levophed. - Continue midodrine - Continue Lasix 60 twice daily -Daily BMP -Hold losartan given hypotension  AKI (acute kidney injury) ([place])- (present on admission) Baseline creatinine is CKD stage V, family were considering peritoneal dialysis.  Baseline creatinine was around 3.94.  Here his creatinine has been up to 4.5, but he is still making 2 L of urine, and creatinine is stable. -Consult nephrology, appreciate cares - Strict ins and outs  Anemia in other chronic diseases classified elsewhere Due to hypothyroidism.  Macrocytic.  Hemoglobin trending up -Continue levothyroxine  AF (paroxysmal atrial fibrillation) (HCC) Rate controlled here, not on anticoagulation - Hold home amiodarone   Pressure injury of buttock, stage 2 (HCC)- (present on  admission) Local wound care  Acute respiratory failure with hypoxia ([place])- (present on admission) Patient presented with cardiogenic shock and respiratory failure.  This is improved and he has been weaned down to 1.5 L oxygen.  Hyponatremia- (present on admission) Diuresed and this resolved.  CKD (chronic kidney disease), stage V ([place])- (present on admission) See above  Benign essential HTN- (present on admission) Blood pressure soft - Hold losartan, torsemide     Subjective: Patient wakes up and is able to eat, able to answer some questions but not many.  Still extremely debilitated and weak.  Objective Vital signs were reviewed and unremarkable.  Still on 1.5 L oxygen. Chronically ill-appearing elderly adult male, appears frail and weak and tired.  Opens eyes, nods head, then falls back asleep.  Heart rate regular, I did not appreciate anything but scant lower extremity edema, I cannot see the JVP is neck bandages, but the lung sounds are clear, without rales or wheezing.  Abdomen soft.  Attention diminished, affect blunted  Data Reviewed:  My review of labs and imaging shows TSH 84, platelets slightly low, hemoglobin up to 9, sodium up to normal, creatinine down to 4.4.     Family Communication: Daughter, son, and daughter-in-law at the bedside.  Disposition: Status is: Inpatient  Remains inpatient appropriate because: He was admitted with myxedema,, and severe cardiogenic shock as a result.  He was treated with supplemental thyroid hormone, and he is stabilized.  Myxedema in an 88 year old with CKD V has extremely high mortality, family are aware.  Given his day by day clinical and  cognitive improvement, they are hoping to see over the weekend if he can rehabilitate at all, or should go home with hospice.  These arrangements are ongoing.          Author: Edwin Dada, MD 10/06/2021 8:25 PM  For on call review [email or web address].

## 2021-10-06 NOTE — Assessment & Plan Note (Signed)
Baseline creatinine is CKD stage V, family were considering peritoneal dialysis.  Baseline creatinine was around 3.94.  Here his creatinine has been up to 4.5, but he is still making 2 L of urine, and creatinine is stable. -Consult nephrology, appreciate cares - Strict ins and outs

## 2021-10-06 NOTE — Assessment & Plan Note (Signed)
Blood pressure soft - Hold losartan, torsemide

## 2021-10-06 NOTE — Assessment & Plan Note (Signed)
Diuresed and this resolved.

## 2021-10-06 NOTE — Assessment & Plan Note (Signed)
See above

## 2021-10-06 NOTE — Assessment & Plan Note (Signed)
Rate controlled here, not on anticoagulation - Hold home amiodarone

## 2021-10-06 NOTE — Assessment & Plan Note (Signed)
-   Hold losartan given hypotension

## 2021-10-06 NOTE — Assessment & Plan Note (Signed)
Patient presented with cardiogenic shock and respiratory failure.  This is improved and he has been weaned down to 1.5 L oxygen.

## 2021-10-06 NOTE — Hospital Course (Addendum)
[name] is an 79 yr. M with CKD V, baseline 3.9, sCHF EF <20% due to ischemic CM, AS and CAD s/p CABG and AVR 2011, pAF not on AC,  and PVD who presented to the ED with unresponsiveness.   Patient had gone to Pulmonary clinic for evaluation of dyspnea, fatigue, was told to go to the ER.  Reportedly, family took patient to eat and then home, where he became unresponsive, EMS activated.  EMS found in a chair at home with HR 20s and BP was 80/60, hypothermic, started pacing, started Levophed and transported.     In the ER he was lethargic, somnolent and getting externally paced.  ECG showed junctional bradycardia.  Hypothermic, hypotensive, CXR with bilateral effusions and edema.  Cr 4.69 BNP: 2137, TSH: 213.   1/13: Started on levothyroxine, HC and Levophed and admitted to ICU, Cardiology and Nephrology consulted  1/14: Started Lasix gtt 1/15: Weaned off Levophed and Lasix drip to bolus Lasix IV 1/17: Echo shows myxomatous MV, with mod to severe mitral regurg 1/18: Palliative consulted 1/19: Transferred O[facility]

## 2021-10-06 NOTE — Progress Notes (Signed)
[facility]  ROUNDING NOTE   Subjective:   Patient sitting up in bed Currently eating breakfast Poor appetite Remains on 2L [place] Appears withdrawn  Objective:  Vital signs in last 24 hours:  Temp:  [97.4 F (36.3 C)-98.1 F (36.7 C)] 97.4 F (36.3 C) (01/20 0745) Pulse Rate:  [64-71] 69 (01/20 0745) Resp:  [17-19] 19 (01/20 0745) BP: (90-103)/(64-72) 99/71 (01/20 0745) SpO2:  [99 %-100 %] 100 % (01/20 0745)  Weight change:  Filed Weights   10/03/21 0500 10/04/21 0500 10/05/21 0729  Weight: 75.3 kg 75.4 kg 74 kg    Intake/Output: I/O last 3 completed shifts: In: 49 [P.O.:970] Out: 2675 [Urine:2675]   Intake/Output this shift:  Total I/O In: 360 [P.O.:360] Out: -   Physical Exam: General: NAD, withdrawn  Head: Normocephalic, atraumatic. Moist oral mucosal membranes  Eyes: Anicteric  Neck: Supple  Lungs:  Basilar rales, normal effort, 2L [place]  Heart: regular  Abdomen:  Soft, nontender, bowel sounds present  Extremities: 1+ lower extremity edema.2+ BUE edema  Neurologic: Awake, alert, following commands  Skin: No acute rash, generalized bruising  Access: NONE    Basic Metabolic Panel: Recent Labs  Lab 09/30/21 0549 10/01/21 0411 10/02/21 0411 10/02/21 1730 10/03/21 0523 10/04/21 0404 10/05/21 0610 10/06/21 0659  NA 132* 132* 132* 133* 134* 134* 138 139  K 3.9 3.6 4.2 4.0 4.3 4.2 3.9 3.6  CL 102 102 103 104 103 104 107 106  CO2 18* 18* 19* 19* 19* 20* 20* 21*  GLUCOSE 121* 112* 90 143* 107* 100* 100* 93  BUN 126* 116* 134* 125* 130* 140* 142* 121*  CREATININE 4.47* 4.49* 4.99* 4.78* 4.81* 4.97* 4.82* 4.42*  CALCIUM 8.0* 8.1* 7.9* 7.6* 7.8* 7.8* 7.8* 7.9*  MG 2.4 2.3 2.4  --   --   --   --   --   PHOS 6.4* 6.4* 7.0*  --   --   --   --   --      Liver Function Tests: Recent Labs  Lab 10/01/21 0411  ALBUMIN 3.0*    No results for input(s): LIPASE, AMYLASE in the last 168 hours. No results for input(s): AMMONIA in the last 168  hours.  CBC: Recent Labs  Lab 10/02/21 0411 10/03/21 0523 10/04/21 0404 10/05/21 0610 10/06/21 0659  WBC 6.5 6.5 6.2 6.2 7.7  HGB 8.0* 8.3* 8.4* 8.7* 9.0*  HCT 22.8* 24.7* 24.4* 25.1* 26.0*  MCV 100.9* 101.2* 101.2* 102.4* 100.4*  PLT 109* 108* 102* 101* 110*     Cardiac Enzymes: No results for input(s): CKTOTAL, CKMB, CKMBINDEX, TROPONINI in the last 168 hours.  BNP: Invalid input(s): POCBNP  CBG: Recent Labs  Lab 10/05/21 2000 10/06/21 0804  GLUCAP 135* 108*     Microbiology: Results for orders placed or performed during the hospital encounter of 09/28/21  Resp Panel by RT-PCR (Flu A&B, Covid) Nasopharyngeal Swab     Status: None   Collection Time: 09/28/21  8:19 PM   Specimen: Nasopharyngeal Swab; Nasopharyngeal(NP) swabs in vial transport medium  Result Value Ref Range Status   SARS Coronavirus 2 by RT PCR NEGATIVE NEGATIVE Final    Comment: (NOTE) SARS-CoV-2 target nucleic acids are NOT DETECTED.  The SARS-CoV-2 RNA is generally detectable in upper respiratory specimens during the acute phase of infection. The lowest concentration of SARS-CoV-2 viral copies this assay can detect is 138 copies/mL. A negative result does not preclude SARS-Cov-2 infection and should not be used as the sole basis for  treatment or other patient management decisions. A negative result may occur with  improper specimen collection/handling, submission of specimen other than nasopharyngeal swab, presence of viral mutation(s) within the areas targeted by this assay, and inadequate number of viral copies(<138 copies/mL). A negative result must be combined with clinical observations, patient history, and epidemiological information. The expected result is Negative.  Fact Sheet for Patients:  EntrepreneurPulse.com.au  Fact Sheet for Healthcare Providers:  IncredibleEmployment.be  This test is no t yet approved or cleared by the [place] FDA  and  has been authorized for detection and/or diagnosis of SARS-CoV-2 by FDA under an Emergency Use Authorization (EUA). This EUA will remain  in effect (meaning this test can be used) for the duration of the COVID-19 declaration under Section 564(b)(1) of the Act, 21 U.S.C.section 360bbb-3(b)(1), unless the authorization is terminated  or revoked sooner.       Influenza A by PCR NEGATIVE NEGATIVE Final   Influenza B by PCR NEGATIVE NEGATIVE Final    Comment: (NOTE) The Xpert Xpress SARS-CoV-2/FLU/RSV plus assay is intended as an aid in the diagnosis of influenza from Nasopharyngeal swab specimens and should not be used as a sole basis for treatment. Nasal washings and aspirates are unacceptable for Xpert Xpress SARS-CoV-2/FLU/RSV testing.  Fact Sheet for Patients: EntrepreneurPulse.com.au  Fact Sheet for Healthcare Providers: IncredibleEmployment.be  This test is not yet approved or cleared by the [place] FDA and has been authorized for detection and/or diagnosis of SARS-CoV-2 by FDA under an Emergency Use Authorization (EUA). This EUA will remain in effect (meaning this test can be used) for the duration of the COVID-19 declaration under Section 564(b)(1) of the Act, 21 U.S.C. section 360bbb-3(b)(1), unless the authorization is terminated or revoked.  Performed at [facility], [address] [place], [place] [address]   Culture, blood (routine x 2)     Status: None   Collection Time: 09/28/21  9:47 PM   Specimen: BLOOD  Result Value Ref Range Status   Specimen Description BLOOD LEFT FOREARM  Final   Special Requests   Final    BOTTLES DRAWN AEROBIC AND ANAEROBIC Blood Culture adequate volume   Culture   Final    NO GROWTH 5 DAYS Performed at [facility], [address] [place], [place] [address]    Report Status 10/03/2021 FINAL  Final  Culture, blood (routine x 2)     Status: None   Collection Time:  09/28/21  9:47 PM   Specimen: BLOOD  Result Value Ref Range Status   Specimen Description BLOOD LEFT FOREARM  Final   Special Requests IN PEDIATRIC BOTTLE Blood Culture adequate volume  Final   Culture   Final    NO GROWTH 5 DAYS Performed at [facility], [address] [place], [place] [address]    Report Status 10/03/2021 FINAL  Final  MRSA Next Gen by PCR, Nasal     Status: Abnormal   Collection Time: 09/29/21  2:38 AM   Specimen: Nasal Mucosa; Nasal Swab  Result Value Ref Range Status   MRSA by PCR Next Gen DETECTED (A) NOT DETECTED Final    Comment: RESULT CALLED TO, READ BACK BY AND VERIFIED WITH: Hansel Starling @0353  on 09/29/21 SKL (NOTE) The GeneXpert MRSA Assay (FDA approved for NASAL specimens only), is one component of a comprehensive MRSA colonization surveillance program. It is not intended to diagnose MRSA infection nor to guide or monitor treatment for MRSA infections. Test performance is not FDA approved  in patients less than 89 years old. Performed at [facility], [address] [place], [place] [address]     Coagulation Studies: No results for input(s): LABPROT, INR in the last 72 hours.   Urinalysis: No results for input(s): COLORURINE, LABSPEC, PHURINE, GLUCOSEU, HGBUR, BILIRUBINUR, KETONESUR, PROTEINUR, UROBILINOGEN, NITRITE, LEUKOCYTESUR in the last 72 hours.  Invalid input(s): APPERANCEUR     Imaging: No results found.   Medications:    sodium chloride Stopped (09/29/21 2200)    Chlorhexidine Gluconate Cloth  6 each Topical Q0600   feeding supplement  1 Container Oral TID BM   furosemide  60 mg Intravenous Q12H   heparin  5,000 Units Subcutaneous Q8H   levothyroxine  75 mcg Oral Q0600   midodrine  10 mg Oral TID WC   multivitamin with minerals  1 tablet Oral Daily   mupirocin ointment   Nasal BID   docusate sodium, polyethylene glycol  Assessment/ Plan:   [name] is a 88 y.o. 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 Started on IV levothyroxine and now on levothyroxine 25mg  daily. Also on stress dose steroids.   1. Acute kidney injury on Chronic kidney disease stage V: baseline creatinine of 3.9 on 09/04/21. Patient with chronic kidney disease secondary to hypertension and vascular disease. Acute kidney injury secondary to acute exacerbation of chronic systolic congestive heart failure: Family was interested in peritoneal dialysis prior to admission. Needed cardiology clearance before proceeding with PD catheter placement.   - holding losartan due to hypotension and acute kidney injury -Creatinine remains stable, UOP 2.2L recorded in 24 hours.  - Continue diuresis  - Not a dialysis candidate, due to weakened cardiac state - Palliative care to continue goals fo care discussion with family and patient   2. Hypotension: with acute exacerbation of systolic congestive heart failure: echo from 01/31/21 with Ejection Fraction of <20%. Cardiogenic shock. Holding home regimen of losartan and PO torsemide.   - ECHO on 10/03/21 shows EF <20% with global hypokinesis - Continue diuresis  3. Anemia with chronic kidney disease: improved hemoglobin 9.0.  4. Hyponatremia: secondary to chronic kidney disease and congestive heart failure.   -Improved with diuresis - Continue Furosemide  5. Secondary Hyperparathyroidism: PTH 110 on 08/31/21. Continue  holding calcitriol.     LOS: [place] 1/20/202310:46 AM

## 2021-10-07 ENCOUNTER — Inpatient Hospital Stay: Payer: PPO

## 2021-10-07 DIAGNOSIS — E035 Myxedema coma: Secondary | ICD-10-CM | POA: Diagnosis not present

## 2021-10-07 DIAGNOSIS — I5023 Acute on chronic systolic (congestive) heart failure: Secondary | ICD-10-CM | POA: Diagnosis not present

## 2021-10-07 DIAGNOSIS — D696 Thrombocytopenia, unspecified: Secondary | ICD-10-CM

## 2021-10-07 DIAGNOSIS — J9601 Acute respiratory failure with hypoxia: Secondary | ICD-10-CM | POA: Diagnosis not present

## 2021-10-07 DIAGNOSIS — I480 Paroxysmal atrial fibrillation: Secondary | ICD-10-CM | POA: Diagnosis not present

## 2021-10-07 LAB — CBC
HCT: 27.3 % — ABNORMAL LOW (ref 39.0–52.0)
Hemoglobin: 9.2 g/dL — ABNORMAL LOW (ref 13.0–17.0)
MCH: 34.6 pg — ABNORMAL HIGH (ref 26.0–34.0)
MCHC: 33.7 g/dL (ref 30.0–36.0)
MCV: 102.6 fL — ABNORMAL HIGH (ref 80.0–100.0)
Platelets: 109 10*3/uL — ABNORMAL LOW (ref 150–400)
RBC: 2.66 MIL/uL — ABNORMAL LOW (ref 4.22–5.81)
RDW: 17.6 % — ABNORMAL HIGH (ref 11.5–15.5)
WBC: 7.9 10*3/uL (ref 4.0–10.5)
nRBC: 0.3 % — ABNORMAL HIGH (ref 0.0–0.2)

## 2021-10-07 LAB — COMPREHENSIVE METABOLIC PANEL
ALT: 27 U/L (ref 0–44)
AST: 35 U/L (ref 15–41)
Albumin: 3.1 g/dL — ABNORMAL LOW (ref 3.5–5.0)
Alkaline Phosphatase: 61 U/L (ref 38–126)
Anion gap: 12 (ref 5–15)
BUN: 121 mg/dL — ABNORMAL HIGH (ref 8–23)
CO2: 25 mmol/L (ref 22–32)
Calcium: 8.1 mg/dL — ABNORMAL LOW (ref 8.9–10.3)
Chloride: 103 mmol/L (ref 98–111)
Creatinine, Ser: 4.06 mg/dL — ABNORMAL HIGH (ref 0.61–1.24)
GFR, Estimated: 13 mL/min — ABNORMAL LOW (ref >60)
Glucose, Bld: 92 mg/dL (ref 70–99)
Potassium: 3.9 mmol/L (ref 3.5–5.1)
Sodium: 140 mmol/L (ref 135–145)
Total Bilirubin: 1 mg/dL (ref 0.3–1.2)
Total Protein: 6.5 g/dL (ref 6.5–8.1)

## 2021-10-07 LAB — T3: T3, Total: 31 ng/dL — ABNORMAL LOW (ref 71–180)

## 2021-10-07 MED ORDER — LIOTHYRONINE SODIUM 5 MCG PO TABS
5.0000 ug | ORAL_TABLET | Freq: Every day | ORAL | Status: DC
  Administered 2021-10-07 – 2021-10-16 (×10): 5 ug via ORAL
  Filled 2021-10-07 (×10): qty 1

## 2021-10-07 MED ORDER — FUROSEMIDE 40 MG PO TABS
80.0000 mg | ORAL_TABLET | Freq: Two times a day (BID) | ORAL | Status: DC
  Administered 2021-10-07: 80 mg via ORAL
  Filled 2021-10-07: qty 2

## 2021-10-07 MED ORDER — FUROSEMIDE 40 MG PO TABS
40.0000 mg | ORAL_TABLET | Freq: Two times a day (BID) | ORAL | Status: DC
  Administered 2021-10-07: 40 mg via ORAL
  Filled 2021-10-07: qty 1

## 2021-10-07 NOTE — Assessment & Plan Note (Addendum)
Rate controlled here, not on anticoagulation at baseline due to patient preference - Hold home amiodarone

## 2021-10-07 NOTE — Assessment & Plan Note (Signed)
- 

## 2021-10-07 NOTE — Assessment & Plan Note (Addendum)
Still on O2.  BP maintaining on midodrine.    CXR with massive CM, still substantial edema, but weaned to room air. Given his MR (not mitraclip amenable given dilated CM) this is probably inevitable even with high doses torsemide.    Thankfully, his symptoms (dyspnea at rest, without orthopnea or swelling) are mild and he is able to stand with nursing.  - Continue midodrine - Transition to PO high dose torsemide 40 BID and monitor UOP, O2 needs  - Daily BMP - Hold losartan given hypotension

## 2021-10-07 NOTE — Assessment & Plan Note (Addendum)
Blood pressure controlled, normal - Hold losartan

## 2021-10-07 NOTE — Assessment & Plan Note (Addendum)
Baseline CKD stage V, family were considering peritoneal dialysis.  Baseline creatinine was around 3.9.    UOP good, 1.1L yesterday, Cr continue to trend down.    - Consult nephrology, appreciate cares - Strict ins and outs - Follow 24 hour urine

## 2021-10-07 NOTE — Assessment & Plan Note (Signed)
See above

## 2021-10-07 NOTE — Assessment & Plan Note (Signed)
Due to hypothyroidism.  Macrocytic.  Hemoglobin trending up -Continue levothyroxine

## 2021-10-07 NOTE — Assessment & Plan Note (Addendum)
Patient presented with cardiogenic shock and respiratory failure.  This is improved and he has been weaned down to 1.5 L oxygen, his home O2 level

## 2021-10-07 NOTE — Progress Notes (Signed)
PT Cancellation Note  Patient Details Name: [name] MRN: [id number] DOB: March 17, 1931   Cancelled Treatment:    Reason Eval/Treat Not Completed: Patient at procedure or test/unavailable Due to patient's in room with imaging for a chest X-ray, will hold on PT at this time, and will re-attempt at a later time/date as available. Thank You!    Iva Boop, PT  10/07/21. 2:01 PM

## 2021-10-07 NOTE — Plan of Care (Signed)
°  Problem: Education: Goal: Knowledge of General Education information will improve Description: Including pain rating scale, medication(s)/side effects and non-pharmacologic comfort measures Outcome: Progressing   Problem: Health Behavior/Discharge Planning: Goal: Ability to manage health-related needs will improve Outcome: Progressing   Problem: Clinical Measurements: Goal: Ability to maintain clinical measurements within normal limits will improve Outcome: Progressing Goal: Will remain free from infection Outcome: Progressing Goal: Diagnostic test results will improve Outcome: Progressing Goal: Respiratory complications will improve Outcome: Progressing Goal: Cardiovascular complication will be avoided Outcome: Progressing   Problem: Activity: Goal: Risk for activity intolerance will decrease Outcome: Progressing   Problem: Coping: Goal: Level of anxiety will decrease Outcome: Progressing   Problem: Pain Managment: Goal: General experience of comfort will improve Outcome: Progressing   Problem: Safety: Goal: Ability to remain free from injury will improve Outcome: Progressing   Problem: Skin Integrity: Goal: Risk for impaired skin integrity will decrease Outcome: Progressing   Problem: Nutrition: Goal: Adequate nutrition will be maintained Outcome: Not Progressing   Problem: Elimination: Goal: Will not experience complications related to bowel motility Outcome: Not Progressing Goal: Will not experience complications related to urinary retention Outcome: Not Progressing   

## 2021-10-07 NOTE — Progress Notes (Signed)
Progress Note   Patient: [name] LNL:[id number] DOB: May 13, 1931 DOA: 09/28/2021     9 DOS: the patient was seen and examined on 10/07/2021       Brief hospital course: Mr. Reap is an 48 yr. M with CKD V, baseline 3.9, sCHF EF <20% due to ischemic CM, AS and CAD s/p CABG and AVR 2011, pAF not on AC,  and PVD who presented to the ED with unresponsiveness.   Patient had gone to Pulmonary clnic for evaluation of dyspnea, fatigue, was told to go to the ER.  Reportedly, family took patient to eat and then home, where he became unresponsive, EMS activated.  EMS found in a chair at home with HR 20s and BP was 80/60, hypothermic, started pacing, started Levophed and transported.     In the ER he was lethargic, somnolent and getting externally paced.  ECG showed junctional bradycardia.  Hypothermic, hypotensive, CXR with bilateral effusions and edema.  Cr 4.69 BNP: 2137, TSH: 213.   Started on Levophed and admitted to ICU      Assessment and Plan * 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[place]) TSH has dropped by more than half, free T4 almost normal, T3 still low.  Clinically improved alertness again today. - Continue levothyroxine - Cautiously add T3  Cardiogenic shock ([place])- (present on admission) - Hold losartan given hypotension  Acute on chronic systolic CHF (congestive heart failure) ([place])- (present on admission) Still on O2.  BP normal on midodrine.    Asymptomatic, no orthopnea, no dyspnea, no swelling. - Continue midodrine - Transition to oral Lasix - Daily BMP - Hold losartan given hypotension  AKI (acute kidney injury) ([place])- (present on admission) Baseline creatinine is CKD stage V, family were considering peritoneal dialysis.  Baseline creatinine was around 3.9.    UOP good, 1.5L yesterday, Cr trending down to 4.1 today.   - Consult nephrology, appreciate cares - Strict ins and outs  Thrombocytopenia ([place]) - Okay to continue heparin for dvt ppx  Anemia in other chronic diseases  classified elsewhere Due to hypothyroidism.  Macrocytic.  Hemoglobin trending up -Continue levothyroxine  AF (paroxysmal atrial fibrillation) (HCC) Rate controlled here, not on anticoagulation - Hold home amiodarone   Pressure injury of buttock, stage 2 (HCC)- (present on admission) Local wound care  Acute respiratory failure with hypoxia ([place])- (present on admission) Patient presented with cardiogenic shock and respiratory failure.  This is improved and he has been weaned down to 1.5 L oxygen.  Hyponatremia- (present on admission) Diuresed and this resolved.  CKD (chronic kidney disease), stage V ([place])- (present on admission) See above  Benign essential HTN- (present on admission) Blood pressure soft - Hold losartan, torsemide          Subjective: Patient feels more alert, still very tired.  No dyspnea, orthopnea, swelling.  No fever.  Objective Vital signs were reviewed and unremarkable. General elderly adult male, lying in bed, appears very weak and tired.  However he is awake and walking, makes eye contact and is responding to questions appropriately most of the time.  He has some slowed responses, and occasionally makes inappropriate responses or seems to drift off to sleep during our conversation, but otherwise is oriented to situation.  Heart rate regular, no JVD, no rales, no systolic murmur, no lower extremity edema.  Data Reviewed:  My review of labs is notable for creatinine down to 4.06, free T4 slightly down, T3 slightly down, hemoglobin up to 9.2, stable, platelets 106.  Family Communication: None present  Disposition: Status is: Inpatient  Remains inpatient appropriate because: The patient presented with thyroid failure leading to multiple organ failure and cardiogenic shock.  Thyroid replacement was started, and his shock is resolved.  Clinically he seems to be recovering from hypothyroidism crisis.  We will begin to explore rehab feasibility.   Patient strongly desires to recover and live as long as possible.  However this is all occurred in the backdrop of very severe advanced chronic heart failure (EF less than 20%), and CKD stage V.  Even without the myxedema coma, I am unsure that he would be able to sustain dialysis, certainly when (not if) his renal function deteriorates, he starts to have uremia, fluid overload not resolving with diuretics, or any other medical complication that required dialysis he would need to transfer to hospice          Author: Edwin Dada, MD 10/07/2021 2:37 PM  For on call review [email or web address].

## 2021-10-07 NOTE — Assessment & Plan Note (Signed)
-   Okay to continue heparin for dvt ppx

## 2021-10-07 NOTE — Assessment & Plan Note (Signed)
Diuresed and this resolved.

## 2021-10-07 NOTE — Progress Notes (Signed)
[facility]  ROUNDING NOTE   Subjective:   Patient found resting in bed,attempting to consume his breakfast. He is not in any acute distress,appears slightly forgetful. Clear yellow urine is draining via F/C.  UOP for the preceding 24 hours is 1550 ml  Objective:  Vital signs in last 24 hours:  Temp:  [97.5 F (36.4 C)-97.8 F (36.6 C)] 97.6 F (36.4 C) (01/21 0730) Pulse Rate:  [60-66] 66 (01/21 0730) Resp:  [16-20] 18 (01/21 0730) BP: (91-101)/(61-76) 100/66 (01/21 0730) SpO2:  [95 %-100 %] 95 % (01/21 0730) Weight:  [71.7 kg] 71.7 kg (01/21 0112)  Weight change: -2.3 kg Filed Weights   10/04/21 0500 10/05/21 0729 10/07/21 0112  Weight: 75.4 kg 74 kg 71.7 kg    Intake/Output: I/O last 3 completed shifts: In: 840 [P.O.:840] Out: 2675 [Urine:2675]   Intake/Output this shift:  Total I/O In: 120 [P.O.:120] Out: -   Physical Exam: General: In no acute distress  Head: Normocephalic, atraumatic. Moist oral mucosal membranes  Eyes: Anicteric  Lungs:  Respirations even, slightly labored on exertion, lungs clear  Heart: regular  Abdomen:  Soft, nontender, bowel sounds present  Extremities: No peripheral edema  Neurologic: Awake, alert, following commands,fogetful  Skin: Bilateral arms with ecchymotic areas   Access: NONE    Basic Metabolic Panel: Recent Labs  Lab 10/01/21 0411 10/02/21 0411 10/02/21 1730 10/03/21 0523 10/04/21 0404 10/05/21 0610 10/06/21 0659 10/07/21 0654  NA 132* 132*   < > 134* 134* 138 139 140  K 3.6 4.2   < > 4.3 4.2 3.9 3.6 3.9  CL 102 103   < > 103 104 107 106 103  CO2 18* 19*   < > 19* 20* 20* 21* 25  GLUCOSE 112* 90   < > 107* 100* 100* 93 92  BUN 116* 134*   < > 130* 140* 142* 121* 121*  CREATININE 4.49* 4.99*   < > 4.81* 4.97* 4.82* 4.42* 4.06*  CALCIUM 8.1* 7.9*   < > 7.8* 7.8* 7.8* 7.9* 8.1*  MG 2.3 2.4  --   --   --   --   --   --   PHOS 6.4* 7.0*  --   --   --   --   --   --    < > = values in this interval  not displayed.     Liver Function Tests: Recent Labs  Lab 10/01/21 0411 10/07/21 0654  AST  --  35  ALT  --  27  ALKPHOS  --  61  BILITOT  --  1.0  PROT  --  6.5  ALBUMIN 3.0* 3.1*    No results for input(s): LIPASE, AMYLASE in the last 168 hours. No results for input(s): AMMONIA in the last 168 hours.  CBC: Recent Labs  Lab 10/03/21 0523 10/04/21 0404 10/05/21 0610 10/06/21 0659 10/07/21 0654  WBC 6.5 6.2 6.2 7.7 7.9  HGB 8.3* 8.4* 8.7* 9.0* 9.2*  HCT 24.7* 24.4* 25.1* 26.0* 27.3*  MCV 101.2* 101.2* 102.4* 100.4* 102.6*  PLT 108* 102* 101* 110* 109*     Cardiac Enzymes: No results for input(s): CKTOTAL, CKMB, CKMBINDEX, TROPONINI in the last 168 hours.  BNP: Invalid input(s): POCBNP  CBG: Recent Labs  Lab 10/05/21 2000 10/06/21 0804  GLUCAP 135* 108*     Microbiology: Results for orders placed or performed during the hospital encounter of 09/28/21  Resp Panel by RT-PCR (Flu A&B, Covid) Nasopharyngeal Swab  Status: None   Collection Time: 09/28/21  8:19 PM   Specimen: Nasopharyngeal Swab; Nasopharyngeal(NP) swabs in vial transport medium  Result Value Ref Range Status   SARS Coronavirus 2 by RT PCR NEGATIVE NEGATIVE Final    Comment: (NOTE) SARS-CoV-2 target nucleic acids are NOT DETECTED.  The SARS-CoV-2 RNA is generally detectable in upper respiratory specimens during the acute phase of infection. The lowest concentration of SARS-CoV-2 viral copies this assay can detect is 138 copies/mL. A negative result does not preclude SARS-Cov-2 infection and should not be used as the sole basis for treatment or other patient management decisions. A negative result may occur with  improper specimen collection/handling, submission of specimen other than nasopharyngeal swab, presence of viral mutation(s) within the areas targeted by this assay, and inadequate number of viral copies(<138 copies/mL). A negative result must be combined with clinical  observations, patient history, and epidemiological information. The expected result is Negative.  Fact Sheet for Patients:  EntrepreneurPulse.com.au  Fact Sheet for Healthcare Providers:  IncredibleEmployment.be  This test is no t yet approved or cleared by the [place] FDA and  has been authorized for detection and/or diagnosis of SARS-CoV-2 by FDA under an Emergency Use Authorization (EUA). This EUA will remain  in effect (meaning this test can be used) for the duration of the COVID-19 declaration under Section 564(b)(1) of the Act, 21 U.S.C.section 360bbb-3(b)(1), unless the authorization is terminated  or revoked sooner.       Influenza A by PCR NEGATIVE NEGATIVE Final   Influenza B by PCR NEGATIVE NEGATIVE Final    Comment: (NOTE) The Xpert Xpress SARS-CoV-2/FLU/RSV plus assay is intended as an aid in the diagnosis of influenza from Nasopharyngeal swab specimens and should not be used as a sole basis for treatment. Nasal washings and aspirates are unacceptable for Xpert Xpress SARS-CoV-2/FLU/RSV testing.  Fact Sheet for Patients: EntrepreneurPulse.com.au  Fact Sheet for Healthcare Providers: IncredibleEmployment.be  This test is not yet approved or cleared by the [place] FDA and has been authorized for detection and/or diagnosis of SARS-CoV-2 by FDA under an Emergency Use Authorization (EUA). This EUA will remain in effect (meaning this test can be used) for the duration of the COVID-19 declaration under Section 564(b)(1) of the Act, 21 U.S.C. section 360bbb-3(b)(1), unless the authorization is terminated or revoked.  Performed at [facility], [address] [place], [place] [address]   Culture, blood (routine x 2)     Status: None   Collection Time: 09/28/21  9:47 PM   Specimen: BLOOD  Result Value Ref Range Status   Specimen Description BLOOD LEFT FOREARM  Final    Special Requests   Final    BOTTLES DRAWN AEROBIC AND ANAEROBIC Blood Culture adequate volume   Culture   Final    NO GROWTH 5 DAYS Performed at [facility], [address], [place], [place] [address]    Report Status 10/03/2021 FINAL  Final  Culture, blood (routine x 2)     Status: None   Collection Time: 09/28/21  9:47 PM   Specimen: BLOOD  Result Value Ref Range Status   Specimen Description BLOOD LEFT FOREARM  Final   Special Requests IN PEDIATRIC BOTTLE Blood Culture adequate volume  Final   Culture   Final    NO GROWTH 5 DAYS Performed at [facility], [address], [place],  [address]    Report Status 10/03/2021 FINAL  Final  MRSA Next Gen by PCR, Nasal  Status: Abnormal   Collection Time: 09/29/21  2:38 AM   Specimen: Nasal Mucosa; Nasal Swab  Result Value Ref Range Status   MRSA by PCR Next Gen DETECTED (A) NOT DETECTED Final    Comment: RESULT CALLED TO, READ BACK BY AND VERIFIED WITH: Hansel Starling @0353  on 09/29/21 SKL (NOTE) The GeneXpert MRSA Assay (FDA approved for NASAL specimens only), is one component of a comprehensive MRSA colonization surveillance program. It is not intended to diagnose MRSA infection nor to guide or monitor treatment for MRSA infections. Test performance is not FDA approved in patients less than 63 years old. Performed at [facility], [address] [place],  [address]     Coagulation Studies: No results for input(s): LABPROT, INR in the last 72 hours.   Urinalysis: No results for input(s): COLORURINE, LABSPEC, PHURINE, GLUCOSEU, HGBUR, BILIRUBINUR, KETONESUR, PROTEINUR, UROBILINOGEN, NITRITE, LEUKOCYTESUR in the last 72 hours.  Invalid input(s): APPERANCEUR     Imaging: No results found.   Medications:    sodium chloride Stopped (09/29/21 2200)    Chlorhexidine Gluconate Cloth  6 each Topical Q0600   feeding supplement  1 Container Oral TID BM   furosemide  40 mg Oral BID    heparin  5,000 Units Subcutaneous Q8H   levothyroxine  75 mcg Oral Q0600   midodrine  10 mg Oral TID WC   multivitamin with minerals  1 tablet Oral Daily   mupirocin ointment   Nasal BID   docusate sodium, polyethylene glycol  Assessment/ Plan:   [name] is a 88 y.o. 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 Started on IV levothyroxine and now on levothyroxine 25mg  daily. Also on stress dose steroids.   1. Acute kidney injury on Chronic kidney disease stage V: baseline creatinine of 3.9 on 09/04/21. Patient with chronic kidney disease secondary to hypertension and vascular disease. Acute kidney injury secondary to acute exacerbation of chronic systolic congestive heart failure: Family was interested in peritoneal dialysis prior to admission. Needed cardiology clearance before proceeding with PD catheter placement, possibly not a dialysis candidate, due to weakened cardiac state.Palliative care to continue goals fo care discussion with family and patient Lab Results  Component Value Date   CREATININE 4.06 (H) 10/07/2021   CREATININE 4.42 (H) 10/06/2021   CREATININE 4.82 (H) 10/05/2021  Patient is currently on Furosemide 40 mg PO  BID  Urine output adequate In no acute respiratory distress  2. Hypotension: with acute exacerbation of systolic congestive heart failure: echo from 01/31/21 with Ejection Fraction of <20%.  Cardiogenic shock. Holding home regimen of losartan and PO torsemide.   - ECHO on 10/03/21 shows EF <20% with global hypokinesis - Continue diuresis with Furosemide, changed to oral now BP 100/66 today,on Midodrine 10 mg TID  3. Anemia with chronic kidney disease Lab Results  Component Value Date   HGB 9.2 (L) 10/07/2021     4. Hyponatremia: secondary to chronic kidney disease and congestive heart failure.   Sodium 140 today  5. Secondary Hyperparathyroidism: PTH 110 on 08/31/21. Lab Results  Component  Value Date   CALCIUM 8.1 (L) 10/07/2021   CAION 1.12 02/23/2010   PHOS 7.0 (H) 10/02/2021      LOS: 9 Karleen Seebeck 1/21/202311:11 AM

## 2021-10-07 NOTE — Assessment & Plan Note (Addendum)
Admitted and started on levothyroxine, HC.  TSH has dropped by more than half, free T4 almost normal, T3 still low.  HC weaned off.  Has made some gradual improvements in cognition over last few days, no real change today from yesterday.  Able to stand with nursing, expresses desire to rehab.  - Continue levothyroxine and liothyronine

## 2021-10-07 NOTE — Assessment & Plan Note (Signed)
-   Hold losartan given hypotension

## 2021-10-08 DIAGNOSIS — I480 Paroxysmal atrial fibrillation: Secondary | ICD-10-CM | POA: Diagnosis not present

## 2021-10-08 DIAGNOSIS — J9601 Acute respiratory failure with hypoxia: Secondary | ICD-10-CM | POA: Diagnosis not present

## 2021-10-08 DIAGNOSIS — E035 Myxedema coma: Secondary | ICD-10-CM | POA: Diagnosis not present

## 2021-10-08 DIAGNOSIS — I5023 Acute on chronic systolic (congestive) heart failure: Secondary | ICD-10-CM | POA: Diagnosis not present

## 2021-10-08 LAB — BASIC METABOLIC PANEL
Anion gap: 14 (ref 5–15)
BUN: 117 mg/dL — ABNORMAL HIGH (ref 8–23)
CO2: 23 mmol/L (ref 22–32)
Calcium: 8 mg/dL — ABNORMAL LOW (ref 8.9–10.3)
Chloride: 103 mmol/L (ref 98–111)
Creatinine, Ser: 3.89 mg/dL — ABNORMAL HIGH (ref 0.61–1.24)
GFR, Estimated: 14 mL/min — ABNORMAL LOW (ref >60)
Glucose, Bld: 86 mg/dL (ref 70–99)
Potassium: 3.5 mmol/L (ref 3.5–5.1)
Sodium: 140 mmol/L (ref 135–145)

## 2021-10-08 LAB — CREATININE CLEARANCE, URINE, 24 HOUR
Collection Interval-CRCL: 24 hours
Creatinine Clearance: 14 mL/min — ABNORMAL LOW (ref 75–125)
Creatinine, 24H Ur: 779 mg/d — ABNORMAL LOW (ref 800–2000)
Creatinine, Urine: 41 mg/dL
Urine Total Volume-CRCL: 1900 mL

## 2021-10-08 MED ORDER — TORSEMIDE 20 MG PO TABS
40.0000 mg | ORAL_TABLET | Freq: Two times a day (BID) | ORAL | Status: DC
  Administered 2021-10-08 – 2021-10-14 (×14): 40 mg via ORAL
  Filled 2021-10-08 (×15): qty 2

## 2021-10-08 MED ORDER — TORSEMIDE 20 MG PO TABS
20.0000 mg | ORAL_TABLET | Freq: Two times a day (BID) | ORAL | Status: DC
Start: 2021-10-08 — End: 2021-10-08

## 2021-10-08 MED ORDER — TORSEMIDE 20 MG PO TABS: 60.0000 mg | ORAL_TABLET | Freq: Two times a day (BID) | ORAL | Status: DC

## 2021-10-08 NOTE — Progress Notes (Signed)
Progress Note   Patient: [name] UVO:[id number] DOB: 1930-10-08 DOA: 09/28/2021     10 DOS: the patient was seen and examined on 10/08/2021        Brief hospital course: [name] is an 32 yr. M with CKD V, baseline 3.9, sCHF EF <20% due to ischemic CM, AS and CAD s/p CABG and AVR 2011, pAF not on AC,  and PVD who presented to the ED with unresponsiveness.   Patient had gone to Pulmonary clinic for evaluation of dyspnea, fatigue, was told to go to the ER.  Reportedly, family took patient to eat and then home, where he became unresponsive, EMS activated.  EMS found in a chair at home with HR 20s and BP was 80/60, hypothermic, started pacing, started Levophed and transported.     In the ER he was lethargic, somnolent and getting externally paced.  ECG showed junctional bradycardia.  Hypothermic, hypotensive, CXR with bilateral effusions and edema.  Cr 4.69 BNP: 2137, TSH: 213.   1/13: Started on levothyroxine, HC and Levophed and admitted to ICU, Cardiology and Nephrology consulted  1/14: Started Lasix gtt 1/15: Weaned off Levophed and Lasix drip to bolus Lasix IV 1/17: Echo shows myxomatous MV, with mod to severe mitral regurg 1/18: Palliative consulted 1/19: Transferred OOU       Assessment and Plan * 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[place]) Admitted and started on levothyroxine, HC.  TSH has dropped by more than half, free T4 almost normal, T3 still low.  HC weaned off.  Has made some gradual improvements in cognition over last few days, no real change today from yesterday.  Able to stand with nursing, expresses desire to rehab.  - Continue levothyroxine and liothyronine   Cardiogenic shock ([place])- (present on admission) - Hold losartan given hypotension  Acute on chronic systolic CHF (congestive heart failure) ([place])- (present on admission) Still on O2.  BP maintaining on midodrine.    CXR with massive CM, still substantial edema, but weaned to room air. Given his MR (not mitraclip  amenable given dilated CM) this is probably inevitable even with high doses torsemide.    Thankfully, his symptoms (dyspnea at rest, without orthopnea or swelling) are mild and he is able to stand with nursing.  - Continue midodrine - Transition to PO high dose torsemide 40 BID and monitor UOP, O2 needs  - Daily BMP - Hold losartan given hypotension  AKI (acute kidney injury) ([place])- (present on admission) Baseline CKD stage V, family were considering peritoneal dialysis.  Baseline creatinine was around 3.9.    UOP good, 1.1L yesterday, Cr continue to trend down.    - Consult nephrology, appreciate cares - Strict ins and outs - Follow 24 hour urine  Thrombocytopenia ([place]) - Okay to continue heparin for dvt ppx  Anemia in other chronic diseases classified elsewhere Due to hypothyroidism.  Macrocytic.  Hemoglobin trending up -Continue levothyroxine  AF (paroxysmal atrial fibrillation) (HCC) Rate controlled here, not on anticoagulation at baseline due to patient preference - Hold home amiodarone   Pressure injury of buttock, stage 2 ([place])- (present on admission) Local wound care  Acute respiratory failure with hypoxia ([place])- (present on admission) Patient presented with cardiogenic shock and respiratory failure.  This is improved and he has been weaned down to 1.5 L oxygen, his home O2 level  Hyponatremia- (present on admission) Diuresed and this resolved.  CKD (chronic kidney disease), stage V ([place])- (present on admission) See above  Benign essential HTN- (present on admission) Blood pressure  controlled, normal - Hold losartan          Subjective: Tired, has some dyspnea at rest at times.  Has good appetite.  Sleepy often but mentating well.  Able to stand up with nursing.  No fever.  Increased swelling.  No orthopnea.  Objective Vital signs were reviewed and unremarkable. Tired elderly adult male, lying in bed.  Makes eye contact and responds to questions  appropriately.  Heart rate with a soft systolic murmur, no tachycardia, no lower extremity edema, no JVD.  Lung sounds diminished at the bases, no wheezing, relatively poor air movement.  Psychomotor slowing noted, attention diminished.      Data Reviewed:  Chest x-ray personally reviewed shows large cardiomegaly, bilateral pulmonary edema and effusion, complete blood count notable for improving hemoglobin.  Basic metabolic panel notable for him proving creatinine.  Family Communication: Daughter by phone  Disposition: Status is: Inpatient  Remains inpatient appropriate because: At present we have no safe disposition.  Patient admitted with thyroid failure, cardiogenic shock.  He has been stabilized and is improving from the standpoint of the thyroid.  Shock physiology resolved, and we are testing out today if he can maintain with oral diuretics.  Patient is not a dialysis candidate, although thankfully his renal function is not in crisis.  He does appear to have congestive heart failure and mitral regurgitation symptoms at rest now, but if he is able to maintain his volume status on oral diuretics, he may be able to survive on the order of weeks to months.  Family wish for him to get strong enough that they can take him home.  PT eval pending.           Author: Edwin Dada, MD 10/08/2021 3:43 PM  For on call review [email or web address].

## 2021-10-08 NOTE — Progress Notes (Signed)
PT Cancellation Note  Patient Details Name: [name] MRN: [id number] DOB: 30-Oct-1930   Cancelled Treatment:    Reason Eval/Treat Not Completed: Patient declined, no reason specified PT evaluation started with pt consent at [phone]; pt refusing further service upon breakfast arrival 1324: patient refusing PT services.  Will follow up at later date.   [name] A Chiquitta Matty 10/08/2021, 1:48 PM

## 2021-10-09 DIAGNOSIS — E035 Myxedema coma: Secondary | ICD-10-CM | POA: Diagnosis not present

## 2021-10-09 LAB — RENAL FUNCTION PANEL
Albumin: 3 g/dL — ABNORMAL LOW (ref 3.5–5.0)
Anion gap: 10 (ref 5–15)
BUN: 108 mg/dL — ABNORMAL HIGH (ref 8–23)
CO2: 24 mmol/L (ref 22–32)
Calcium: 8 mg/dL — ABNORMAL LOW (ref 8.9–10.3)
Chloride: 104 mmol/L (ref 98–111)
Creatinine, Ser: 3.83 mg/dL — ABNORMAL HIGH (ref 0.61–1.24)
GFR, Estimated: 14 mL/min — ABNORMAL LOW (ref >60)
Glucose, Bld: 93 mg/dL (ref 70–99)
Phosphorus: 5.1 mg/dL — ABNORMAL HIGH (ref 2.5–4.6)
Potassium: 4 mmol/L (ref 3.5–5.1)
Sodium: 138 mmol/L (ref 135–145)

## 2021-10-09 LAB — CBC
HCT: 26.5 % — ABNORMAL LOW (ref 39.0–52.0)
Hemoglobin: 8.9 g/dL — ABNORMAL LOW (ref 13.0–17.0)
MCH: 34.5 pg — ABNORMAL HIGH (ref 26.0–34.0)
MCHC: 33.6 g/dL (ref 30.0–36.0)
MCV: 102.7 fL — ABNORMAL HIGH (ref 80.0–100.0)
Platelets: 120 10*3/uL — ABNORMAL LOW (ref 150–400)
RBC: 2.58 MIL/uL — ABNORMAL LOW (ref 4.22–5.81)
RDW: 17.8 % — ABNORMAL HIGH (ref 11.5–15.5)
WBC: 6.6 10*3/uL (ref 4.0–10.5)
nRBC: 0.3 % — ABNORMAL HIGH (ref 0.0–0.2)

## 2021-10-09 NOTE — TOC Progression Note (Signed)
Transition of Care [facility]) - Progression Note    Patient Details  Name: [name] MRN: [id number] Date of Birth: 1930-09-21  Transition of Care [facility]) CM/SW [place], [place] Phone Number: 10/09/2021, 3:41 PM  Clinical Narrative:     CSW spoke with patient's daughter Butch Penny to inform patient worked with PT today and evals recommend Snf. She reports she will speak with family and get back with CSW if they are in agreement with this discharge plan. Reports they will be at the hospital this evening.         Expected Discharge Plan and Services                                                 Social Determinants of Health (SDOH) Interventions    Readmission Risk Interventions Readmission Risk Prevention Plan 05/31/2021  Transportation Screening Complete  PCP or Specialist Appt within 3-5 Days Complete  Social Work Consult for [place] Planning/Counseling Complete  Palliative Care Screening Not Applicable  Medication Review Press photographer) Complete  Some recent data might be hidden

## 2021-10-09 NOTE — Evaluation (Signed)
Physical Therapy Evaluation Patient Details Name: [name] MRN: [id number] DOB: [birth year] Today's Date: 10/09/2021  History of Present Illness  Pt is an 88 y.o male with significant PMH of  CKD stage IV, hypertension, proteinuria, hyperparathyroidism, HLD, aspiration pneumonia, EF 15-20%, ischemic cardiomyopathy, bilateral carotid stenosis, severe aortic stenosis, AAA, MGUS, paroxysmal SVT, CABG, PDA, status post bioprosthetic valve replacement in 2011 who presented to the ED with unresponsiveness. MD assessment includes: Myxedema coma, cadiogenic shock, acute on chronic CHF, AKI, thrombocytopenia, anemia, A-fib, acute respiratory failure, pressure injury of the buttock stage 2, and hyponatremia.   Clinical Impression  Pt was pleasant and motivated to participate during the session and put forth good effort throughout. Pt required physical assistance with bed mobility and transfers and was only able to amb a maximum of around 3' at the EOB and from bed to chair with very slow, effortful, shuffling steps.  Pt's SpO2 and HR were both WNL on 1LO2/min with no adverse symptoms reported by the pt.  Pt will benefit from PT services in a SNF setting upon discharge to safely address deficits listed in patient problem list for decreased caregiver assistance and eventual return to PLOF.         Recommendations for follow up therapy are one component of a multi-disciplinary discharge planning process, led by the attending physician.  Recommendations may be updated based on patient status, additional functional criteria and insurance authorization.  Follow Up Recommendations Skilled nursing-short term rehab (<3 hours/day)    Assistance Recommended at Discharge None  Patient can return home with the following  Two people to help with walking and/or transfers;A lot of help with bathing/dressing/bathroom;Assistance with cooking/housework;Direct supervision/assist for medications management;Direct  supervision/assist for financial management;Assist for transportation    Equipment Recommendations Other (comment) (TBD at next venue of care)  Recommendations for Other Services       Functional Status Assessment Patient has had a recent decline in their functional status and demonstrates the ability to make significant improvements in function in a reasonable and predictable amount of time.     Precautions / Restrictions Precautions Precautions: Fall Restrictions Weight Bearing Restrictions: No      Mobility  Bed Mobility Overal bed mobility: Needs Assistance Bed Mobility: Rolling, Sidelying to Sit Rolling: Min assist Sidelying to sit: Mod assist       General bed mobility comments: Min-mod A for BLE and trunk control and cuing for sequencing    Transfers Overall transfer level: Needs assistance Equipment used: Rolling walker (2 wheels) Transfers: Sit to/from Stand Sit to Stand: Min assist, Mod assist           General transfer comment: Min to mod A to come to standing from bed in standard position    Ambulation/Gait Ambulation/Gait assistance: Min guard Gait Distance (Feet): 3 Feet Assistive device: Rolling walker (2 wheels) Gait Pattern/deviations: Step-to pattern, Shuffle, Trunk flexed Gait velocity: decreased     General Gait Details: Pt able to take shuffling steps from bed to chair with very slow cadence and heavily flexed trunk  Stairs            Wheelchair Mobility    Modified Rankin (Stroke Patients Only)       Balance Overall balance assessment: Needs assistance   Sitting balance-Leahy Scale: Fair     Standing balance support: Bilateral upper extremity supported, During functional activity Standing balance-Leahy Scale: Fair  Pertinent Vitals/Pain Pain Assessment Pain Assessment: No/denies pain    Home Living Family/patient expects to be discharged to:: Private residence Living  Arrangements: Alone Available Help at Discharge: Family;Available PRN/intermittently Type of Home: House Home Access: Level entry       Home Layout: One level Home Equipment: Conservation officer, nature (2 wheels);Rollator (4 wheels);Shower seat Additional Comments: Lift Chair    Prior Function Prior Level of Function : Independent/Modified Independent             Mobility Comments: Mod Ind amb with a rollator limited community distances, no fall history ADLs Comments: Ind with ADLs, family assists with transportation only     Hand Dominance   Dominant Hand: Right    Extremity/Trunk Assessment   Upper Extremity Assessment Upper Extremity Assessment: Generalized weakness    Lower Extremity Assessment Lower Extremity Assessment: Generalized weakness       Communication   Communication: Other (comment) (Slow to process and respond)  Cognition Arousal/Alertness: Lethargic Behavior During Therapy: Flat affect Overall Cognitive Status: No family/caregiver present to determine baseline cognitive functioning                                 General Comments: Pt able to follow commands with extra time and cuing but slow to process and respond        General Comments      Exercises Total Joint Exercises Ankle Circles/Pumps: AROM, Strengthening, Both, 10 reps Quad Sets: Strengthening, Both, 10 reps Heel Slides: Strengthening, AAROM, Both, 5 reps Hip ABduction/ADduction: AAROM, Strengthening, Both, 5 reps Straight Leg Raises: AAROM, Strengthening, Both, 5 reps Long Arc Quad: AROM, Strengthening, Both, 10 reps Knee Flexion: AROM, Strengthening, Both, 10 reps Marching in Standing: AROM, Strengthening, Both, 5 reps, Standing Other Exercises Other Exercises: HEP education and review for BLE APs, QS, and GS x 10 each every 1-2/hours daily   Assessment/Plan    PT Assessment Patient needs continued PT services  PT Problem List Decreased strength;Decreased activity  tolerance;Decreased balance;Decreased mobility;Decreased knowledge of use of DME       PT Treatment Interventions DME instruction;Gait training;Functional mobility training;Therapeutic activities;Therapeutic exercise;Balance training;Patient/family education    PT Goals (Current goals can be found in the Care Plan section)  Acute Rehab PT Goals Patient Stated Goal: To walk again PT Goal Formulation: With patient Time For Goal Achievement: 10/22/21 Potential to Achieve Goals: Good    Frequency Min 2X/week     Co-evaluation               AM-PAC PT "6 Clicks" Mobility  Outcome Measure Help needed turning from your back to your side while in a flat bed without using bedrails?: A Little Help needed moving from lying on your back to sitting on the side of a flat bed without using bedrails?: A Lot Help needed moving to and from a bed to a chair (including a wheelchair)?: A Lot Help needed standing up from a chair using your arms (e.g., wheelchair or bedside chair)?: A Lot Help needed to walk in hospital room?: Total Help needed climbing 3-5 steps with a railing? : Total 6 Click Score: 11    End of Session Equipment Utilized During Treatment: Gait belt;Oxygen Activity Tolerance: Patient tolerated treatment well Patient left: in chair;with call bell/phone within reach;with chair alarm set Nurse Communication: Mobility status PT Visit Diagnosis: Unsteadiness on feet (R26.81);Difficulty in walking, not elsewhere classified (R26.2);Muscle weakness (generalized) (M62.81)  Time: 6948-5462 PT Time Calculation (min) (ACUTE ONLY): 34 min   Charges:   PT Evaluation $PT Eval Moderate Complexity: 1 Mod PT Treatments $Therapeutic Exercise: 8-22 mins       D. Scott Zosia Lucchese PT, DPT 10/09/21, 11:56 AM

## 2021-10-09 NOTE — Progress Notes (Signed)
PROGRESS NOTE    [name]  EXN:[id number] DOB: Feb 11, 1931 DOA: 09/28/2021 PCP: Baxter Hire, MD   Chief Complain: Unresponsiveness  Brief Narrative: [name] is an 20 yr. M with CKD V, baseline 3.9, sCHF EF <20% due to ischemic CM, AS and CAD s/p CABG and AVR 2011, pAF not on AC,  and PVD who presented to the ED with unresponsiveness.   Patient had gone to Pulmonary clinic for evaluation of dyspnea, fatigue, was told to go to the ER.  Reportedly, family took patient to eat and then home, where he became unresponsive, EMS activated.  EMS found in a chair at home with HR 20s and BP was 80/60, hypothermic, started pacing, started Levophed and transported. In the ER he was lethargic, somnolent and getting externally paced.  ECG showed junctional bradycardia.  Hypothermic, hypotensive, CXR with bilateral effusions and edema.  Cr 4.69 BNP: 2137, TSH: 213.  He was started on levothyroxine, hydrocortisone and Levophed and admitted to ICU, Cardiology and Nephrology consulted .  Overall status is improving but he remains significantly weak Palliative care following for goals of care. PT recommended skilled nursing facility on discharge.  TOC consulted  Assessment & Plan:   Principal Problem:   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[place]) Active Problems:   Benign essential HTN   CKD (chronic kidney disease), stage V (HCC)   Hyponatremia   Acute respiratory failure with hypoxia (HCC)   Acute on chronic systolic CHF (congestive heart failure) (HCC)   Pressure injury of buttock, stage 2 (HCC)   AKI (acute kidney injury) (HCC)   Cardiogenic shock (HCC)   AF (paroxysmal atrial fibrillation) (HCC)   Demand ischemia (HCC)   Anemia in other chronic diseases classified elsewhere   Thrombocytopenia ([place])   Myxedema coma  -Presented with unresponsiveness, severe bradycardia requiring external pacing, hypertension, severe hypothermia.  Found to have severe hypothyroidism.  TSH was 213, free T4 less than 0.25.  Given  1 dose of IV Synthyroid TSH has dropped by more than half, free T4 almost normal, T3 still low. Hydrocortisone weaned off.  Has made some gradual improvements in cognition over last few days  Able to stand with nursing, expresses desire to rehab. Continue levothyroxine and liothyronine   Acute on chronic systolic CHF /cardiogenic shock -BP maintaining on midodrine.   - CXR with massive CM, still substantial edema, but weaned to room air. Given his MR (not mitraclip amenable given dilated CM) this is probably inevitable even with high doses torsemide.   -Echo has shown EF of less than 20%.  Seen by cardiology, no further work-up planned - Continue midodrine - Transitioned to PO high dose torsemide 40 BID and monitor UOP, O2 needs - Daily BMP - Hold losartan given hypotension -Cardiology signed off   AKI on CkD stage 4. - being followed by nephrology, appreciate cares - Strict ins and outs - Follow 24 hour urine -Baseline creatinine around 3.9.   Thrombocytopenia  - Stable.Continue monitoring   Microcytic anemia Likely associated with CKD, hypothyroidism. .  Hemoglobin stable -Continue monitoring   Paroxysmal  AF  Rate controlled here, not on anticoagulation at baseline due to patient preference - Hold home amiodarone   Pressure injury of buttock, stage 2  Continue  wound care   Acute respiratory failure with hypoxia  Patient presented with cardiogenic shock and respiratory failure.  This has  improved and he has been weaned down to 1.5 L oxygen, his home O2 level     Benign essential  HTN BP soft, antihypertensives on hold  Debility/deconditioning/goals of care: Palliative care following for goals of care.  Partial code .Has poor prognosis.  PT recommending skilled nursing facility.     Nutrition Problem: Increased nutrient needs Etiology: chronic illness (CHF)      DVT prophylaxis:[place] heparin Code Status: Full Family Communication: None at the bedside Patient  status:Inpatient  Dispo: The patient is from: Home              Anticipated d/c is to: Skilled nursing facility              anticipated d/c date is: After skin nursing facility bed is available  Consultants: Nephrology, PCCM, cardiology  Procedures: None  Antimicrobials:  Anti-infectives (From admission, onward)    Start     Dose/Rate Route Frequency Ordered Stop   09/29/21 0738  vancomycin variable dose per unstable renal function (pharmacist dosing)  Status:  Discontinued         Does not apply See admin instructions 09/29/21 0738 09/29/21 1004   09/29/21 0600  vancomycin (VANCOREADY) IVPB 1750 mg/350 mL        1,750 mg 175 mL/hr over 120 Minutes Intravenous  Once 09/29/21 0455 09/29/21 0822   09/29/21 0545  ceFAZolin (ANCEF) IVPB 1 g/50 mL premix  Status:  Discontinued        1 g 100 mL/hr over 30 Minutes Intravenous Every 12 hours 09/29/21 0454 09/29/21 1004       Subjective: Patient seen and examined at the bedside this afternoon.  He was sitting on the chair.  Very weak.  He wants to stand up and walk.  Oriented.  Not in any kind of distress.    Objective: Vitals:   10/09/21 0430 10/09/21 0500 10/09/21 0816 10/09/21 1106  BP: 98/67  98/67 (!) 92/59  Pulse: 64  70 66  Resp: 16     Temp: 98 F (36.7 C)  98.4 F (36.9 C) 97.9 F (36.6 C)  TempSrc: Oral  Oral   SpO2: 100%  100% 99%  Weight:  71.9 kg    Height:        Intake/Output Summary (Last 24 hours) at 10/09/2021 1134 Last data filed at 10/09/2021 1100 Gross per 24 hour  Intake 240 ml  Output 2275 ml  Net -2035 ml   Filed Weights   10/07/21 0112 10/08/21 0500 10/09/21 0500  Weight: 71.7 kg 72.1 kg 71.9 kg    Examination:  General exam: Chronically ill looking, weak, debilitated HEENT: PERRL Respiratory system:  no wheezes or crackles  Cardiovascular system: S1 & S2 heard, RRR.  Gastrointestinal system: Abdomen is nondistended, soft and nontender. Central nervous system: Alert and  oriented Extremities: No edema, no clubbing ,no cyanosis Skin: No rashes, no ulcers,no icterus  GU: Foley     Data Reviewed: I have personally reviewed following labs and imaging studies  CBC: Recent Labs  Lab 10/04/21 0404 10/05/21 0610 10/06/21 0659 10/07/21 0654 10/09/21 0501  WBC 6.2 6.2 7.7 7.9 6.6  HGB 8.4* 8.7* 9.0* 9.2* 8.9*  HCT 24.4* 25.1* 26.0* 27.3* 26.5*  MCV 101.2* 102.4* 100.4* 102.6* 102.7*  PLT 102* 101* 110* 109* 355*   Basic Metabolic Panel: Recent Labs  Lab 10/05/21 0610 10/06/21 0659 10/07/21 0654 10/08/21 0603 10/09/21 0501  NA 138 139 140 140 138  K 3.9 3.6 3.9 3.5 4.0  CL 107 106 103 103 104  CO2 20* 21* 25 23 24   GLUCOSE 100* 93 92 86 93  BUN  142* 121* 121* 117* 108*  CREATININE 4.82* 4.42* 4.06* 3.89* 3.83*  CALCIUM 7.8* 7.9* 8.1* 8.0* 8.0*  PHOS  --   --   --   --  5.1*   GFR: Estimated Creatinine Clearance: 13.6 mL/min (A) (by C-G formula based on SCr of 3.83 mg/dL (H)). Liver Function Tests: Recent Labs  Lab 10/07/21 0654 10/09/21 0501  AST 35  --   ALT 27  --   ALKPHOS 61  --   BILITOT 1.0  --   PROT 6.5  --   ALBUMIN 3.1* 3.0*   No results for input(s): LIPASE, AMYLASE in the last 168 hours. No results for input(s): AMMONIA in the last 168 hours. Coagulation Profile: No results for input(s): INR, PROTIME in the last 168 hours. Cardiac Enzymes: No results for input(s): CKTOTAL, CKMB, CKMBINDEX, TROPONINI in the last 168 hours. BNP (last 3 results) No results for input(s): PROBNP in the last 8760 hours. HbA1C: No results for input(s): HGBA1C in the last 72 hours. CBG: Recent Labs  Lab 10/05/21 2000 10/06/21 0804  GLUCAP 135* 108*   Lipid Profile: No results for input(s): CHOL, HDL, LDLCALC, TRIG, CHOLHDL, LDLDIRECT in the last 72 hours. Thyroid Function Tests: No results for input(s): TSH, T4TOTAL, FREET4, T3FREE, THYROIDAB in the last 72 hours. Anemia Panel: No results for input(s): VITAMINB12, FOLATE,  FERRITIN, TIBC, IRON, RETICCTPCT in the last 72 hours. Sepsis Labs: No results for input(s): PROCALCITON, LATICACIDVEN in the last 168 hours.  No results found for this or any previous visit (from the past 240 hour(s)).       Radiology Studies: DG Chest Port 1 View  Result Date: 10/07/2021 CLINICAL DATA:  Unresponsive EXAM: PORTABLE CHEST 1 VIEW COMPARISON:  09/30/2021, CT 05/19/2001, 05/31/2021, chest x-ray 09/09/2020 FINDINGS: Post sternotomy changes and valve prosthesis. Cardiomegaly with vascular congestion and small right greater than left pleural effusions. Probable underlying interstitial edema. Patchy airspace opacities at the bases without significant change IMPRESSION: 1. Overall no great change in radiographic appearance of the chest since 09/30/2021. 2. Cardiomegaly with vascular congestion, small bilateral effusions and probable pulmonary edema. 3. Patchy airspace disease at the bases which may be due to atelectasis or pneumonia Electronically Signed   By: Donavan Foil M.D.   On: 10/07/2021 15:46        Scheduled Meds:  Chlorhexidine Gluconate Cloth  6 each Topical Q0600   feeding supplement  1 Container Oral TID BM   heparin  5,000 Units Subcutaneous Q8H   levothyroxine  75 mcg Oral Q0600   liothyronine  5 mcg Oral Daily   midodrine  10 mg Oral TID WC   multivitamin with minerals  1 tablet Oral Daily   mupirocin ointment   Nasal BID   torsemide  40 mg Oral BID   Continuous Infusions:   LOS: 11 days       Shelly Coss, MD [facility] P1/23/2023, 11:34 AM

## 2021-10-09 NOTE — Progress Notes (Signed)
[facility]  ROUNDING NOTE   Subjective:   Patient seen sitting up in chair  Tolerating meals Continues to complain of shortness of breath on exertion  Creatinine slowly improving  UOP 2L in 24 hours  Objective:  Vital signs in last 24 hours:  Temp:  [97.9 F (36.6 C)-98.4 F (36.9 C)] 97.9 F (36.6 C) (01/23 1106) Pulse Rate:  [62-70] 66 (01/23 1106) Resp:  [16-20] 16 (01/23 0430) BP: (91-98)/(52-71) 92/59 (01/23 1106) SpO2:  [99 %-100 %] 99 % (01/23 1106) Weight:  [71.9 kg] 71.9 kg (01/23 0500)  Weight change: -0.2 kg Filed Weights   10/07/21 0112 10/08/21 0500 10/09/21 0500  Weight: 71.7 kg 72.1 kg 71.9 kg    Intake/Output: I/O last 3 completed shifts: In: 840 [P.O.:840] Out: 2075 [Urine:2075]   Intake/Output this shift:  Total I/O In: 0  Out: 650 [Urine:650]  Physical Exam: General: In no acute distress  Head: Normocephalic, atraumatic. Moist oral mucosal membranes  Eyes: Anicteric  Lungs:  Respirations even, Clear to auscultation  Heart: regular  Abdomen:  Soft, nontender, bowel sounds present  Extremities: No peripheral edema  Neurologic: Awake, alert, following commands,fogetful  Skin: Bilateral arms with ecchymotic areas   Access: NONE    Basic Metabolic Panel: Recent Labs  Lab 10/05/21 0610 10/06/21 0659 10/07/21 0654 10/08/21 0603 10/09/21 0501  NA 138 139 140 140 138  K 3.9 3.6 3.9 3.5 4.0  CL 107 106 103 103 104  CO2 20* 21* 25 23 24   GLUCOSE 100* 93 92 86 93  BUN 142* 121* 121* 117* 108*  CREATININE 4.82* 4.42* 4.06* 3.89* 3.83*  CALCIUM 7.8* 7.9* 8.1* 8.0* 8.0*  PHOS  --   --   --   --  5.1*     Liver Function Tests: Recent Labs  Lab 10/07/21 0654 10/09/21 0501  AST 35  --   ALT 27  --   ALKPHOS 61  --   BILITOT 1.0  --   PROT 6.5  --   ALBUMIN 3.1* 3.0*    No results for input(s): LIPASE, AMYLASE in the last 168 hours. No results for input(s): AMMONIA in the last 168 hours.  CBC: Recent Labs  Lab  10/04/21 0404 10/05/21 0610 10/06/21 0659 10/07/21 0654 10/09/21 0501  WBC 6.2 6.2 7.7 7.9 6.6  HGB 8.4* 8.7* 9.0* 9.2* 8.9*  HCT 24.4* 25.1* 26.0* 27.3* 26.5*  MCV 101.2* 102.4* 100.4* 102.6* 102.7*  PLT 102* 101* 110* 109* 120*     Cardiac Enzymes: No results for input(s): CKTOTAL, CKMB, CKMBINDEX, TROPONINI in the last 168 hours.  BNP: Invalid input(s): POCBNP  CBG: Recent Labs  Lab 10/05/21 2000 10/06/21 0804  GLUCAP 135* 108*     Microbiology: Results for orders placed or performed during the hospital encounter of 09/28/21  Resp Panel by RT-PCR (Flu A&B, Covid) Nasopharyngeal Swab     Status: None   Collection Time: 09/28/21  8:19 PM   Specimen: Nasopharyngeal Swab; Nasopharyngeal(NP) swabs in vial transport medium  Result Value Ref Range Status   SARS Coronavirus 2 by RT PCR NEGATIVE NEGATIVE Final    Comment: (NOTE) SARS-CoV-2 target nucleic acids are NOT DETECTED.  The SARS-CoV-2 RNA is generally detectable in upper respiratory specimens during the acute phase of infection. The lowest concentration of SARS-CoV-2 viral copies this assay can detect is 138 copies/mL. A negative result does not preclude SARS-Cov-2 infection and should not be used as the sole basis for treatment or other patient management  decisions. A negative result may occur with  improper specimen collection/handling, submission of specimen other than nasopharyngeal swab, presence of viral mutation(s) within the areas targeted by this assay, and inadequate number of viral copies(<138 copies/mL). A negative result must be combined with clinical observations, patient history, and epidemiological information. The expected result is Negative.  Fact Sheet for Patients:  EntrepreneurPulse.com.au  Fact Sheet for Healthcare Providers:  IncredibleEmployment.be  This test is no t yet approved or cleared by the [place] FDA and  has been authorized for  detection and/or diagnosis of SARS-CoV-2 by FDA under an Emergency Use Authorization (EUA). This EUA will remain  in effect (meaning this test can be used) for the duration of the COVID-19 declaration under Section 564(b)(1) of the Act, 21 U.S.C.section 360bbb-3(b)(1), unless the authorization is terminated  or revoked sooner.       Influenza A by PCR NEGATIVE NEGATIVE Final   Influenza B by PCR NEGATIVE NEGATIVE Final    Comment: (NOTE) The Xpert Xpress SARS-CoV-2/FLU/RSV plus assay is intended as an aid in the diagnosis of influenza from Nasopharyngeal swab specimens and should not be used as a sole basis for treatment. Nasal washings and aspirates are unacceptable for Xpert Xpress SARS-CoV-2/FLU/RSV testing.  Fact Sheet for Patients: EntrepreneurPulse.com.au  Fact Sheet for Healthcare Providers: IncredibleEmployment.be  This test is not yet approved or cleared by the [place] FDA and has been authorized for detection and/or diagnosis of SARS-CoV-2 by FDA under an Emergency Use Authorization (EUA). This EUA will remain in effect (meaning this test can be used) for the duration of the COVID-19 declaration under Section 564(b)(1) of the Act, 21 U.S.C. section 360bbb-3(b)(1), unless the authorization is terminated or revoked.  Performed at [facility], [address] [place], [place] [address]   Culture, blood (routine x 2)     Status: None   Collection Time: 09/28/21  9:47 PM   Specimen: BLOOD  Result Value Ref Range Status   Specimen Description BLOOD LEFT FOREARM  Final   Special Requests   Final    BOTTLES DRAWN AEROBIC AND ANAEROBIC Blood Culture adequate volume   Culture   Final    NO GROWTH 5 DAYS Performed at [facility], [address], [place],  [address]    Report Status 10/03/2021 FINAL  Final  Culture, blood (routine x 2)     Status: None   Collection Time: 09/28/21  9:47 PM   Specimen:  BLOOD  Result Value Ref Range Status   Specimen Description BLOOD LEFT FOREARM  Final   Special Requests IN PEDIATRIC BOTTLE Blood Culture adequate volume  Final   Culture   Final    NO GROWTH 5 DAYS Performed at [facility], [address], [place],  [address]    Report Status 10/03/2021 FINAL  Final  MRSA Next Gen by PCR, Nasal     Status: Abnormal   Collection Time: 09/29/21  2:38 AM   Specimen: Nasal Mucosa; Nasal Swab  Result Value Ref Range Status   MRSA by PCR Next Gen DETECTED (A) NOT DETECTED Final    Comment: RESULT CALLED TO, READ BACK BY AND VERIFIED WITH: Hansel Starling @0353  on 09/29/21 SKL (NOTE) The GeneXpert MRSA Assay (FDA approved for NASAL specimens only), is one component of a comprehensive MRSA colonization surveillance program. It is not intended to diagnose MRSA infection nor to guide or monitor treatment for MRSA infections. Test performance is not FDA approved in patients less than 2  years old. Performed at [facility], [address] [place], [place] [address]     Coagulation Studies: No results for input(s): LABPROT, INR in the last 72 hours.   Urinalysis: No results for input(s): COLORURINE, LABSPEC, PHURINE, GLUCOSEU, HGBUR, BILIRUBINUR, KETONESUR, PROTEINUR, UROBILINOGEN, NITRITE, LEUKOCYTESUR in the last 72 hours.  Invalid input(s): APPERANCEUR     Imaging: DG Chest Port 1 View  Result Date: 10/07/2021 CLINICAL DATA:  Unresponsive EXAM: PORTABLE CHEST 1 VIEW COMPARISON:  09/30/2021, CT 05/19/2001, 05/31/2021, chest x-ray 09/09/2020 FINDINGS: Post sternotomy changes and valve prosthesis. Cardiomegaly with vascular congestion and small right greater than left pleural effusions. Probable underlying interstitial edema. Patchy airspace opacities at the bases without significant change IMPRESSION: 1. Overall no great change in radiographic appearance of the chest since 09/30/2021. 2. Cardiomegaly with vascular congestion,  small bilateral effusions and probable pulmonary edema. 3. Patchy airspace disease at the bases which may be due to atelectasis or pneumonia Electronically Signed   By: Donavan Foil M.D.   On: 10/07/2021 15:46     Medications:      Chlorhexidine Gluconate Cloth  6 each Topical Q0600   feeding supplement  1 Container Oral TID BM   heparin  5,000 Units Subcutaneous Q8H   levothyroxine  75 mcg Oral Q0600   liothyronine  5 mcg Oral Daily   midodrine  10 mg Oral TID WC   multivitamin with minerals  1 tablet Oral Daily   mupirocin ointment   Nasal BID   torsemide  40 mg Oral BID   docusate sodium, polyethylene glycol  Assessment/ Plan:   [name] is a 88 y.o. 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 Started on IV levothyroxine and now on levothyroxine 25mg  daily. Also on stress dose steroids.   1. Acute kidney injury on Chronic kidney disease stage V: baseline creatinine of 3.9 on 09/04/21. Patient with chronic kidney disease secondary to hypertension and vascular disease. Acute kidney injury secondary to acute exacerbation of chronic systolic congestive heart failure: Family was interested in peritoneal dialysis prior to admission. Needed cardiology clearance before proceeding with PD catheter placement, possibly not a dialysis candidate, due to weakened cardiac state.Palliative care to continue goals fo care discussion with  family and patient Lab Results  Component Value Date   CREATININE 3.83 (H) 10/09/2021   CREATININE 3.89 (H) 10/08/2021   CREATININE 4.06 (H) 10/07/2021  Continue Torsemide 40 mg PO  BID  Adequate urine output recorded Will continue to monitor renal function.   2. Hypotension: with acute exacerbation of systolic congestive heart failure: echo from 01/31/21 with Ejection Fraction of <20%. Cardiogenic shock. Holding home regimen of losartan and PO torsemide.   - ECHO on 10/03/21 shows EF <20% with global hypokinesis - Continue diuresis with oral torsemide BP soft at 92/59, continue midodrine 10 mg TID  3. Anemia with chronic kidney disease Lab Results  Component Value Date   HGB 8.9 (L) 10/09/2021  We will continue to monitor  4. Hyponatremia: secondary to chronic kidney disease and congestive heart failure.   Sodium  138   5. Secondary Hyperparathyroidism: PTH 110 on 08/31/21. Lab Results  Component Value Date   CALCIUM 8.0 (L) 10/09/2021   CAION 1.12 02/23/2010   PHOS 5.1 (H) 10/09/2021    Calcium and phosphorus are at acceptable range.   LOS: [place] 1/23/20231:40 PM

## 2021-10-09 NOTE — Evaluation (Signed)
Occupational Therapy Evaluation Patient Details Name: [name] MRN: [id number] DOB: 1931-03-11 Today's Date: 10/09/2021   History of Present Illness Pt is an 88 y.o male with significant PMH of  CKD stage IV, hypertension, proteinuria, hyperparathyroidism, HLD, aspiration pneumonia, EF 15-20%, ischemic cardiomyopathy, bilateral carotid stenosis, severe aortic stenosis, AAA, MGUS, paroxysmal SVT, CABG, PDA, status post bioprosthetic valve replacement in 2011 who presented to the ED with unresponsiveness. MD assessment includes: Myxedema coma, cadiogenic shock, acute on chronic CHF, AKI, thrombocytopenia, anemia, A-fib, acute respiratory failure, pressure injury of the buttock stage 2, and hyponatremia.   Clinical Impression   Patient presenting with decreased Ind in self care, balance, functional mobility/transfers, endurance, and safety awareness. Patient reports living at home alone with use of RW for mobility.Pt's family assist with IADL tasks and taking him to appointments.  Patient currently functioning at mod A to stand from recliner chair. Min - mod A with use of RW to step to the left towards bed. Pt then takes side steps to the L and R along be but needing assistance to move/manage RW.  Patient will benefit from acute OT to increase overall independence in the areas of ADLs, functional mobility, and safety awareness in order to safely discharge to next venue of care.     Recommendations for follow up therapy are one component of a multi-disciplinary discharge planning process, led by the attending physician.  Recommendations may be updated based on patient status, additional functional criteria and insurance authorization.   Follow Up Recommendations  Skilled nursing-short term rehab (<3 hours/day)    Assistance Recommended at Discharge Frequent or constant Supervision/Assistance  Patient can return home with the following A lot of help with walking and/or transfers;A lot of help  with bathing/dressing/bathroom;Assistance with cooking/housework;Assistance with feeding    Functional Status Assessment  Patient has had a recent decline in their functional status and demonstrates the ability to make significant improvements in function in a reasonable and predictable amount of time.  Equipment Recommendations  Other (comment) (defer to next venue of care)       Precautions / Restrictions Precautions Precautions: Fall      Mobility Bed Mobility Overal bed mobility: Needs Assistance Bed Mobility: Sit to Supine       Sit to supine: Mod assist   General bed mobility comments: Assist with trunk support and R LE to return to bed    Transfers Overall transfer level: Needs assistance Equipment used: Rolling walker (2 wheels) Transfers: Sit to/from Stand Sit to Stand: Mod assist           General transfer comment: mod lifting assistance      Balance Overall balance assessment: Needs assistance   Sitting balance-Leahy Scale: Fair     Standing balance support: Bilateral upper extremity supported, During functional activity Standing balance-Leahy Scale: Fair                             ADL either performed or assessed with clinical judgement   ADL Overall ADL's : Needs assistance/impaired     Grooming: Wash/dry hands;Wash/dry face;Sitting;Supervision/safety                   Armed forces technical officer: Moderate assistance;Rolling walker (2 wheels) Toilet Transfer Details (indicate cue type and reason): simulated                 Vision Patient Visual Report: No change from baseline  Pertinent Vitals/Pain Pain Assessment Pain Assessment: No/denies pain     Hand Dominance Right   Extremity/Trunk Assessment Upper Extremity Assessment Upper Extremity Assessment: Generalized weakness   Lower Extremity Assessment Lower Extremity Assessment: Generalized weakness       Communication  Communication Communication: Other (comment)   Cognition Arousal/Alertness: Lethargic Behavior During Therapy: Flat affect Overall Cognitive Status: No family/caregiver present to determine baseline cognitive functioning                                 General Comments: Pt able to follow commands with extra time and cuing but slow to process and respond                Home Living Family/patient expects to be discharged to:: Private residence Living Arrangements: Alone Available Help at Discharge: Family;Available PRN/intermittently Type of Home: House Home Access: Level entry     Home Layout: One level     Bathroom Shower/Tub: Occupational psychologist: Standard Bathroom Accessibility: Yes How Accessible: Accessible via walker Home Equipment: Cora (2 wheels);Rollator (4 wheels);Shower seat   Additional Comments: Lift Chair      Prior Functioning/Environment Prior Level of Function : Independent/Modified Independent             Mobility Comments: Mod Ind amb with a rollator limited community distances, no fall history ADLs Comments: Ind with ADLs, family assists with transportation only        OT Problem List: Decreased strength;Decreased activity tolerance;Impaired balance (sitting and/or standing);Decreased safety awareness;Decreased knowledge of use of DME or AE;Decreased knowledge of precautions      OT Treatment/Interventions: Self-care/ADL training;Therapeutic exercise;Therapeutic activities;Energy conservation;DME and/or AE instruction;Patient/family education;Manual therapy;Balance training    OT Goals(Current goals can be found in the care plan section) Acute Rehab OT Goals Patient Stated Goal: to get stronger and regain independence OT Goal Formulation: With patient/family Time For Goal Achievement: 10/23/21 Potential to Achieve Goals: Fair ADL Goals Pt Will Perform Grooming: with min guard assist;standing Pt Will  Perform Lower Body Dressing: with min guard assist;sit to/from stand Pt Will Transfer to Toilet: with min guard assist;ambulating Pt Will Perform Toileting - Clothing Manipulation and hygiene: with min guard assist;sit to/from stand  OT Frequency: Min 2X/week       AM-PAC OT "6 Clicks" Daily Activity     Outcome Measure Help from another person eating meals?: None Help from another person taking care of personal grooming?: A Little Help from another person toileting, which includes using toliet, bedpan, or urinal?: A Lot Help from another person bathing (including washing, rinsing, drying)?: A Lot Help from another person to put on and taking off regular upper body clothing?: A Little Help from another person to put on and taking off regular lower body clothing?: A Lot 6 Click Score: 16   End of Session Equipment Utilized During Treatment: Rolling walker (2 wheels);Oxygen Nurse Communication: Mobility status  Activity Tolerance: Patient tolerated treatment well;Patient limited by fatigue Patient left: in bed;with call bell/phone within reach;with bed alarm set  OT Visit Diagnosis: Unsteadiness on feet (R26.81);Muscle weakness (generalized) (M62.81)                Time: 0923-3007 OT Time Calculation (min): 24 min Charges:  OT General Charges $OT Visit: 1 Visit OT Evaluation $OT Eval Moderate Complexity: 1 Mod OT Treatments $Self Care/Home Management : 8-22 mins  Darleen Crocker, MS, OTR/L , CBIS ascom  [phone]  10/09/21, 4:04 PM

## 2021-10-10 DIAGNOSIS — Z7189 Other specified counseling: Secondary | ICD-10-CM

## 2021-10-10 DIAGNOSIS — E035 Myxedema coma: Secondary | ICD-10-CM | POA: Diagnosis not present

## 2021-10-10 LAB — CBC WITH DIFFERENTIAL/PLATELET
Abs Immature Granulocytes: 0.02 10*3/uL (ref 0.00–0.07)
Basophils Absolute: 0 10*3/uL (ref 0.0–0.1)
Basophils Relative: 0 %
Eosinophils Absolute: 0.7 10*3/uL — ABNORMAL HIGH (ref 0.0–0.5)
Eosinophils Relative: 12 %
HCT: 27.2 % — ABNORMAL LOW (ref 39.0–52.0)
Hemoglobin: 9 g/dL — ABNORMAL LOW (ref 13.0–17.0)
Immature Granulocytes: 0 %
Lymphocytes Relative: 12 %
Lymphs Abs: 0.7 10*3/uL (ref 0.7–4.0)
MCH: 34.5 pg — ABNORMAL HIGH (ref 26.0–34.0)
MCHC: 33.1 g/dL (ref 30.0–36.0)
MCV: 104.2 fL — ABNORMAL HIGH (ref 80.0–100.0)
Monocytes Absolute: 0.4 10*3/uL (ref 0.1–1.0)
Monocytes Relative: 7 %
Neutro Abs: 4.1 10*3/uL (ref 1.7–7.7)
Neutrophils Relative %: 69 %
Platelets: 114 10*3/uL — ABNORMAL LOW (ref 150–400)
RBC: 2.61 MIL/uL — ABNORMAL LOW (ref 4.22–5.81)
RDW: 17.9 % — ABNORMAL HIGH (ref 11.5–15.5)
WBC: 6 10*3/uL (ref 4.0–10.5)
nRBC: 0 % (ref 0.0–0.2)

## 2021-10-10 LAB — BASIC METABOLIC PANEL
Anion gap: 12 (ref 5–15)
BUN: 119 mg/dL — ABNORMAL HIGH (ref 8–23)
CO2: 25 mmol/L (ref 22–32)
Calcium: 8.1 mg/dL — ABNORMAL LOW (ref 8.9–10.3)
Chloride: 101 mmol/L (ref 98–111)
Creatinine, Ser: 3.63 mg/dL — ABNORMAL HIGH (ref 0.61–1.24)
GFR, Estimated: 15 mL/min — ABNORMAL LOW (ref >60)
Glucose, Bld: 92 mg/dL (ref 70–99)
Potassium: 3.6 mmol/L (ref 3.5–5.1)
Sodium: 138 mmol/L (ref 135–145)

## 2021-10-10 MED ORDER — CALCITRIOL 0.25 MCG PO CAPS
0.2500 ug | ORAL_CAPSULE | Freq: Every day | ORAL | Status: DC
  Administered 2021-10-10 – 2021-10-16 (×7): 0.25 ug via ORAL
  Filled 2021-10-10 (×7): qty 1

## 2021-10-10 MED ORDER — ASPIRIN EC 81 MG PO TBEC
81.0000 mg | DELAYED_RELEASE_TABLET | Freq: Every day | ORAL | Status: DC
  Administered 2021-10-10 – 2021-10-16 (×7): 81 mg via ORAL
  Filled 2021-10-10 (×7): qty 1

## 2021-10-10 MED ORDER — PRAVASTATIN SODIUM 20 MG PO TABS
20.0000 mg | ORAL_TABLET | Freq: Every evening | ORAL | Status: DC
  Administered 2021-10-10 – 2021-10-15 (×6): 20 mg via ORAL
  Filled 2021-10-10 (×6): qty 1

## 2021-10-10 NOTE — Progress Notes (Signed)
[facility]  ROUNDING NOTE   Subjective:   Patient seen sitting up in bed, eating breakfast Tolerating meals without nausea and vomiting Denies shortness of breath currently on 1 L nasal cannula  Creatinine stable Urine output 1.6 L in the past 24 hours  Objective:  Vital signs in last 24 hours:  Temp:  [97.3 F (36.3 C)-97.8 F (36.6 C)] 97.3 F (36.3 C) (01/24 1154) Pulse Rate:  [58-68] 65 (01/24 1154) Resp:  [16-18] 16 (01/24 0350) BP: (92-100)/(60-71) 92/63 (01/24 1154) SpO2:  [84 %-100 %] 98 % (01/24 1154)  Weight change:  Filed Weights   10/07/21 0112 10/08/21 0500 10/09/21 0500  Weight: 71.7 kg 72.1 kg 71.9 kg    Intake/Output: I/O last 3 completed shifts: In: 840 [P.O.:840] Out: 2875 [Urine:2875]   Intake/Output this shift:  Total I/O In: 120 [P.O.:120] Out: -   Physical Exam: General: In no acute distress  Head: Normocephalic, atraumatic. Moist oral mucosal membranes  Eyes: Anicteric  Lungs:  Respirations even, Clear to auscultation, [place] O2  Heart: regular  Abdomen:  Soft, nontender, bowel sounds present  Extremities: No peripheral edema  Neurologic: Awake, alert, following commands,fogetful  Skin: Bilateral arms with ecchymotic areas   Access: NONE    Basic Metabolic Panel: Recent Labs  Lab 10/06/21 0659 10/07/21 0654 10/08/21 0603 10/09/21 0501 10/10/21 0725  NA 139 140 140 138 138  K 3.6 3.9 3.5 4.0 3.6  CL 106 103 103 104 101  CO2 21* 25 23 24 25   GLUCOSE 93 92 86 93 92  BUN 121* 121* 117* 108* 119*  CREATININE 4.42* 4.06* 3.89* 3.83* 3.63*  CALCIUM 7.9* 8.1* 8.0* 8.0* 8.1*  PHOS  --   --   --  5.1*  --      Liver Function Tests: Recent Labs  Lab 10/07/21 0654 10/09/21 0501  AST 35  --   ALT 27  --   ALKPHOS 61  --   BILITOT 1.0  --   PROT 6.5  --   ALBUMIN 3.1* 3.0*    No results for input(s): LIPASE, AMYLASE in the last 168 hours. No results for input(s): AMMONIA in the last 168 hours.  CBC: Recent  Labs  Lab 10/05/21 0610 10/06/21 0659 10/07/21 0654 10/09/21 0501 10/10/21 0725  WBC 6.2 7.7 7.9 6.6 6.0  NEUTROABS  --   --   --   --  4.1  HGB 8.7* 9.0* 9.2* 8.9* 9.0*  HCT 25.1* 26.0* 27.3* 26.5* 27.2*  MCV 102.4* 100.4* 102.6* 102.7* 104.2*  PLT 101* 110* 109* 120* 114*     Cardiac Enzymes: No results for input(s): CKTOTAL, CKMB, CKMBINDEX, TROPONINI in the last 168 hours.  BNP: Invalid input(s): POCBNP  CBG: Recent Labs  Lab 10/05/21 2000 10/06/21 0804  GLUCAP 135* 108*     Microbiology: Results for orders placed or performed during the hospital encounter of 09/28/21  Resp Panel by RT-PCR (Flu A&B, Covid) Nasopharyngeal Swab     Status: None   Collection Time: 09/28/21  8:19 PM   Specimen: Nasopharyngeal Swab; Nasopharyngeal(NP) swabs in vial transport medium  Result Value Ref Range Status   SARS Coronavirus 2 by RT PCR NEGATIVE NEGATIVE Final    Comment: (NOTE) SARS-CoV-2 target nucleic acids are NOT DETECTED.  The SARS-CoV-2 RNA is generally detectable in upper respiratory specimens during the acute phase of infection. The lowest concentration of SARS-CoV-2 viral copies this assay can detect is 138 copies/mL. A negative result does not preclude SARS-Cov-2  infection and should not be used as the sole basis for treatment or other patient management decisions. A negative result may occur with  improper specimen collection/handling, submission of specimen other than nasopharyngeal swab, presence of viral mutation(s) within the areas targeted by this assay, and inadequate number of viral copies(<138 copies/mL). A negative result must be combined with clinical observations, patient history, and epidemiological information. The expected result is Negative.  Fact Sheet for Patients:  EntrepreneurPulse.com.au  Fact Sheet for Healthcare Providers:  IncredibleEmployment.be  This test is no t yet approved or cleared by the  [place] FDA and  has been authorized for detection and/or diagnosis of SARS-CoV-2 by FDA under an Emergency Use Authorization (EUA). This EUA will remain  in effect (meaning this test can be used) for the duration of the COVID-19 declaration under Section 564(b)(1) of the Act, 21 U.S.C.section 360bbb-3(b)(1), unless the authorization is terminated  or revoked sooner.       Influenza A by PCR NEGATIVE NEGATIVE Final   Influenza B by PCR NEGATIVE NEGATIVE Final    Comment: (NOTE) The Xpert Xpress SARS-CoV-2/FLU/RSV plus assay is intended as an aid in the diagnosis of influenza from Nasopharyngeal swab specimens and should not be used as a sole basis for treatment. Nasal washings and aspirates are unacceptable for Xpert Xpress SARS-CoV-2/FLU/RSV testing.  Fact Sheet for Patients: EntrepreneurPulse.com.au  Fact Sheet for Healthcare Providers: IncredibleEmployment.be  This test is not yet approved or cleared by the [place] FDA and has been authorized for detection and/or diagnosis of SARS-CoV-2 by FDA under an Emergency Use Authorization (EUA). This EUA will remain in effect (meaning this test can be used) for the duration of the COVID-19 declaration under Section 564(b)(1) of the Act, 21 U.S.C. section 360bbb-3(b)(1), unless the authorization is terminated or revoked.  Performed at [facility], [address] [place], [place] [address]   Culture, blood (routine x 2)     Status: None   Collection Time: 09/28/21  9:47 PM   Specimen: BLOOD  Result Value Ref Range Status   Specimen Description BLOOD LEFT FOREARM  Final   Special Requests   Final    BOTTLES DRAWN AEROBIC AND ANAEROBIC Blood Culture adequate volume   Culture   Final    NO GROWTH 5 DAYS Performed at [facility], [address], [place], [place] [address]    Report Status 10/03/2021 FINAL  Final  Culture, blood (routine x 2)     Status: None    Collection Time: 09/28/21  9:47 PM   Specimen: BLOOD  Result Value Ref Range Status   Specimen Description BLOOD LEFT FOREARM  Final   Special Requests IN PEDIATRIC BOTTLE Blood Culture adequate volume  Final   Culture   Final    NO GROWTH 5 DAYS Performed at [facility], [address], [place], [place] [address]    Report Status 10/03/2021 FINAL  Final  MRSA Next Gen by PCR, Nasal     Status: Abnormal   Collection Time: 09/29/21  2:38 AM   Specimen: Nasal Mucosa; Nasal Swab  Result Value Ref Range Status   MRSA by PCR Next Gen DETECTED (A) NOT DETECTED Final    Comment: RESULT CALLED TO, READ BACK BY AND VERIFIED WITH: Hansel Starling @0353  on 09/29/21 SKL (NOTE) The GeneXpert MRSA Assay (FDA approved for NASAL specimens only), is one component of a comprehensive MRSA colonization surveillance program. It is not intended to diagnose MRSA infection nor to guide or  monitor treatment for MRSA infections. Test performance is not FDA approved in patients less than 42 years old. Performed at [facility], [address] [place], [place] [address]     Coagulation Studies: No results for input(s): LABPROT, INR in the last 72 hours.   Urinalysis: No results for input(s): COLORURINE, LABSPEC, PHURINE, GLUCOSEU, HGBUR, BILIRUBINUR, KETONESUR, PROTEINUR, UROBILINOGEN, NITRITE, LEUKOCYTESUR in the last 72 hours.  Invalid input(s): APPERANCEUR     Imaging: No results found.   Medications:      aspirin EC  81 mg Oral Daily   calcitRIOL  0.25 mcg Oral Daily   Chlorhexidine Gluconate Cloth  6 each Topical Q0600   feeding supplement  1 Container Oral TID BM   heparin  5,000 Units Subcutaneous Q8H   levothyroxine  75 mcg Oral Q0600   liothyronine  5 mcg Oral Daily   midodrine  10 mg Oral TID WC   multivitamin with minerals  1 tablet Oral Daily   mupirocin ointment   Nasal BID   pravastatin  20 mg Oral QPM   torsemide  40 mg Oral BID   docusate sodium,  polyethylene glycol  Assessment/ Plan:   Mr. GARIN MATA is a 88 y.o. white male with hypertension, coronary artery disease, aortic valve replacement, osteoarthritis, diverticulosis, ruptured abdominal viscus, history of hyponatremia, history of hiatal hernia, abdominal aortic aneurysm, constipation, right hip fracture status post repair, chronic systolic heart failure who presents to [name] on 09/28/2021 for Cardiogenic shock ([place]) [R57.0] Anasarca [R60.1] Uremia [N19] Junctional bradycardia [R00.1] Acute on chronic systolic congestive heart failure (HCC) [I50.23] Chronic kidney disease, unspecified CKD stage [N18.9]  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 Started on IV levothyroxine and now on levothyroxine 25mg  daily. Also on stress dose steroids.   1. Acute kidney injury on Chronic kidney disease stage V: baseline creatinine of 3.9 on 09/04/21. Patient with chronic kidney disease secondary to hypertension and vascular disease. Acute kidney injury secondary to acute exacerbation of chronic systolic congestive heart failure: Family was interested in peritoneal dialysis prior to admission. Needed cardiology clearance before proceeding with PD catheter placement, possibly not a dialysis candidate, due to weakened cardiac state.Palliative care to continue goals fo care discussion with family and patient Lab Results  Component Value Date   CREATININE 3.63 (H) 10/10/2021   CREATININE 3.83 (H) 10/09/2021   CREATININE 3.89 (H) 10/08/2021  Continue Torsemide 40 mg PO  BID  Creatinine continues to slowly improve Adequate urine output recorded overnight Palliative care currently following to establish goals of care  2. Hypotension: with acute exacerbation of systolic congestive heart failure: echo from 01/31/21 with Ejection Fraction of <20%. Cardiogenic shock. Holding home regimen of losartan and PO torsemide.   - ECHO on 10/03/21 shows EF <20% with global hypokinesis - Continue diuresis  with oral torsemide BP remains soft at 92/63, continue midodrine 10 mg TID  3. Anemia with chronic kidney disease Lab Results  Component Value Date   HGB 9.0 (L) 10/10/2021   hemoglobin at lower end of normal range.  We will continue to monitor  4. Hyponatremia: secondary to chronic kidney disease and congestive heart failure.   Sodium remains 138   5. Secondary Hyperparathyroidism: PTH 110 on 08/31/21. Lab Results  Component Value Date   CALCIUM 8.1 (L) 10/10/2021   CAION 1.12 02/23/2010   PHOS 5.1 (H) 10/09/2021    We will continue to monitor bone minerals during this admission   LOS: [place] 1/24/20232:52  PM

## 2021-10-10 NOTE — Progress Notes (Signed)
Daily Progress Note   Patient Name: [name]       Date: 10/10/2021 DOB: 01-12-1931  Age: 88 y.o. MRN#: [id number] Attending Physician: Antonieta Pert, MD Primary Care Physician: Baxter Hire, MD Admit Date: 09/28/2021  Reason for Consultation/Follow-up: Establishing goals of care  Subjective: Patient is sitting in bed.  No family at bedside.  He answers questions very deliberately, and with very short answers.  He states that he lives alone at home.  His family helps with cooking and cleaning.  Inquired what a typical day is like for him, but he states "it is hard to say".  His son entered to bedside.  He states they would like an update from cardiology.  He voices understanding of his father's heart failure and prognosis.  He also understands there is recommendation for skilled nursing.  We discussed working to have maximal independence at home.  He understands that palliative will follow, with transition to hospice level care when patient and family is ready.   Patient is more talkative with his son at bedside.  Patient states that he would never want to be put on a ventilator or life support.  He states he would not want CPR as he has been told "it is dangerous and would not work". He states he would not want a feeding tube.   I completed a MOST form today with patient. Son was at bedside. The signed original was placed in the chart. A photocopy was placed in the chart to be scanned into EMR. The patient outlined their wishes for the following treatment decisions:  Cardiopulmonary Resuscitation: Do Not Attempt Resuscitation (DNR/No CPR)  Medical Interventions: Limited Additional Interventions: Use medical treatment, IV fluids and cardiac monitoring as indicated, DO NOT USE intubation or  mechanical ventilation. May consider use of less invasive airway support such as BiPAP or CPAP. Also provide comfort measures. Transfer to the hospital if indicated. Avoid intensive care.   Antibiotics: Antibiotics if indicated  IV Fluids: IV fluids if indicated  Feeding Tube: No feeding tube     Length of Stay: 12  Current Medications: Scheduled Meds:   aspirin EC  81 mg Oral Daily   calcitRIOL  0.25 mcg Oral Daily   Chlorhexidine Gluconate Cloth  6 each Topical Q0600   feeding supplement  1 Container  Oral TID BM   heparin  5,000 Units Subcutaneous Q8H   levothyroxine  75 mcg Oral Q0600   liothyronine  5 mcg Oral Daily   midodrine  10 mg Oral TID WC   multivitamin with minerals  1 tablet Oral Daily   mupirocin ointment   Nasal BID   pravastatin  20 mg Oral QPM   torsemide  40 mg Oral BID    Continuous Infusions:   PRN Meds: docusate sodium, polyethylene glycol  Physical Exam Pulmonary:     Effort: Pulmonary effort is normal.  Neurological:     Mental Status: He is alert.            Vital Signs: BP 92/63    Pulse 65    Temp (!) 97.3 F (36.3 C) (Oral)    Resp 16    Ht 6' (1.829 m)    Wt 71.9 kg    SpO2 98%    BMI 21.50 kg/m  SpO2: SpO2: 98 % O2 Device: O2 Device: Nasal Cannula O2 Flow Rate: O2 Flow Rate (L/min): 1 L/min  Intake/output summary:  Intake/Output Summary (Last 24 hours) at 10/10/2021 1257 Last data filed at 10/10/2021 1047 Gross per 24 hour  Intake 600 ml  Output 1000 ml  Net -400 ml   LBM: Last BM Date: 10/09/21 Baseline Weight: Weight: 70 kg Most recent weight: Weight: 71.9 kg     Patient Active Problem List   Diagnosis Date Noted   Thrombocytopenia ([place]) 10/07/2021   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[place]) 10/06/2021   AF (paroxysmal atrial fibrillation) ([place]) 10/06/2021   Demand ischemia ([place]) 10/06/2021   Anemia in other chronic diseases classified elsewhere 10/06/2021   Cardiogenic shock ([place]) 09/28/2021   AKI (acute kidney injury) ([place]) 05/29/2021    Pressure injury of buttock, stage 2 ([place]) 02/01/2021   Acute respiratory failure with hypoxia ([place]) 09/07/2020   Elevated troponin 09/07/2020   Pleural effusion 09/07/2020   Acute on chronic systolic CHF (congestive heart failure) ([place]) 09/07/2020   Thoracic compression fracture ([place]) 09/07/2020   Hyponatremia 08/05/2020   MGUS (monoclonal gammopathy of unknown significance) 05/23/2016   S/P total knee arthroplasty 04/02/2016   CAD in native artery 11/22/2015   TI (tricuspid incompetence) 05/11/2015   Early satiety 01/25/2015   Benign essential HTN 01/03/2015   Abdominal aortic aneurysm (AAA) without rupture 07/09/2014   MI (mitral incompetence) 07/09/2014   Aortic heart valve narrowing 07/09/2014   Cardiomyopathy, ischemic 06/28/2014   Arthritis of knee, degenerative 05/17/2014   Kidney lump 04/09/2013   Benign prostatic hyperplasia with urinary obstruction 03/17/2013   CKD (chronic kidney disease), stage V ([place]) 03/17/2013   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 No feeding tube. Outpatient palliative with transition to hospice when patient and family are ready.   Code Status:    Code Status Orders  (From admission, onward)           Start     Ordered   09/29/21 1005  Limited resuscitation (code)  Continuous       Question Answer Comment  In the event of cardiac or respiratory ARREST: Initiate Code Blue, Call Rapid Response Yes   In the event of cardiac or respiratory ARREST: Perform CPR Yes   In the event of cardiac or respiratory ARREST: Perform Intubation/Mechanical Ventilation No   In the event of cardiac or respiratory ARREST: Use NIPPV/BiPAp only if indicated Yes   In  the event of cardiac or respiratory ARREST: Administer ACLS medications if indicated Yes   In the event of cardiac or respiratory ARREST: Perform Defibrillation or Cardioversion if indicated Yes       09/29/21 1004           Code Status History     Date Active Date Inactive Code Status Order ID Comments User Context   09/28/2021 2357 09/29/2021 1004 Full Code [id number]  Lang Snow, NP ED   05/29/2021 2243 06/01/2021 2216 Partial Code [id number]  Cox, Amy N, DO ED   05/29/2021 2202 05/29/2021 2243 Full Code [id number]  Cox, Amy N, DO ED   02/06/2021 1027 02/08/2021 2050 DNR [id number]  Sidney Ace, MD Inpatient   02/06/2021 0909 02/06/2021 1026 Full Code [id number]  Sidney Ace, MD Inpatient   02/02/2021 1003 02/06/2021 0909 DNR [id number]  Sidney Ace, MD Inpatient   01/31/2021 1342 02/02/2021 1002 Full Code [id number]  Artist Beach, MD ED   09/07/2020 0430 09/10/2020 1628 Full Code [id number]  Vianne Bulls, MD ED   08/05/2020 2155 08/10/2020 2321 Full Code [id number]  Collier Bullock, MD ED   05/11/2020 2147 05/17/2020 2241 Full Code [id number]  Clarnce Flock, MD Inpatient   04/02/2016 1640 04/05/2016 1816 Full Code [id number]  Dereck Leep, MD Inpatient   05/19/2015 1121 05/20/2015 0326 Full Code [id number]  Sabino Dick, MD HOV      Advance Directive Documentation    Flowsheet Row Most Recent Value  Type of Advance Directive Living will  Pre-existing out of facility DNR order (yellow form or pink MOST form) --  "MOST" Form in Place? --    Thank you for allowing the Palliative Medicine Team to assist in the care of this patient.   Time In: 9:30 Time Out: 10:30 Total Time 60 min Prolonged Time Billed  no       Greater than 50%  of this time was spent counseling and coordinating care related to the above assessment and plan.  Asencion Gowda, NP  Please contact Palliative Medicine Team phone at [phone] for questions and concerns.

## 2021-10-10 NOTE — Progress Notes (Signed)
PROGRESS NOTE    [name]  ZOX:[id number] DOB: 04/03/1931 DOA: 09/28/2021 PCP: Baxter Hire, MD   Chief Complaint  Patient presents with   Bradycardia   unresponsive  Brief Narrative/Hospital Course: [name], 88 y.o. male with PMH of  with CKD V, baseline 3.9, sCHF EF <20% due to ischemic CM, AS and CAD s/p CABG and AVR 2011, pAF not on [facility],  and PVD who presented to the ED with unresponsiveness.  Patient had gone to Pulmonary clinic for evaluation of dyspnea, fatigue, was told to go to the ER  Reportedly, family took patient to eat and then home, where he became unresponsive, EMS activated.  EMS found in a chair at home with HR 20s and BP was 80/60, hypothermic, started pacing, started Levophed and transported.  In the ER he was lethargic, somnolent and getting externally paced.  ECG showed junctional bradycardia. Hypothermic, hypotensive, CXR with bilateral effusions and edema.  Cr 4.69 BNP: 2137, TSH: 213.  He was started on levothyroxine, hydrocortisone and Levophed and admitted to ICU, Cardiology and Nephrology consulted .  At this time patient overall stable and improving tolerating diuretics, PT OT has recommended skilled nursing facility, seen by palliative care   Subjective: Seen thsi am- done with food and was snoring, woke up on calling- alert awake. No new complaints Overnight no fever blood pressure 90s to low 100 Labs with creatinine stable as yesterday and slowly downtrending, CBC fairly stable with improving thrombocytopenia  Assessment & Plan:  Myxedema coma: Presented with unresponsiveness severe bradycardia requiring external pacing, hypothermia.  TSH was 213 Free T4 less than 0.2 S/P IV Synthroid, IV hydrocortisone.  At this time mental status fairly stable on bedtime sleepy.  Continue PT OT deconditioning continue current levothyroxine liothyronine.  Repeat TSH has dropped by more than however free T4 almost normal T3 still low.  Acute on chronic  systolic CHF Cardiogenic shock: Dilated cardiomyopathy: Volume status overall much improved blood pressure stable on midodrine, transitioned to oral diuretics.  Holding losartan due to blood pressure being soft, cardiology has seen and signed off.  Monitor intake output , and weight as below Net IO Since Admission: -9,971.12 mL [10/10/21 1152]  Filed Weights   10/07/21 0112 10/08/21 0500 10/09/21 0500  Weight: 71.7 kg 72.1 kg 71.9 kg    MR w/ dilated cardiomyopathy: Not amenable to MitraClip due to DCM  CAD with CABG x5 in 2011 HLD AAA 3.2 cm in 2020: Seen by cardiology this admission continue patient's statin.  Echo showed EF less than 20%.  Cardiology encouraged good discussion.  Continue aspirin and statin.  AKI on CKD stage IV: Baseline creatinine around 3.9 followed by nephrology continue diuretics as above monitor intake output and renal function. Recent Labs  Lab 10/06/21 0659 10/07/21 0654 10/08/21 0603 10/09/21 0501 10/10/21 0725  BUN 121* 121* 117* 108* 119*  CREATININE 4.42* 4.06* 3.89* 3.83* 3.63*    Benign essential HTN: BP is soft.  On midodrine  Hyponatremia: Stable  Acute respiratory failure with hypoxia: Due to patient's CHF.  Continue supplemental oxygen  PAF: Rate control not on anticoagulation at this time due to patient's preference amiodarone on hold  Demand ischemia: In the setting of CHF. Anemia in other chronic diseases -hemoglobin stable in the setting of CKD and hypothyroidism.  Monitor   Recent Labs  Lab 10/05/21 0610 10/06/21 0659 10/07/21 0654 10/09/21 0501 10/10/21 0725  HGB 8.7* 9.0* 9.2* 8.9* 9.0*  HCT 25.1* 26.0* 27.3* 26.5* 27.2*  Thrombocytopenia: Due to acute illness.  Stable Recent Labs  Lab 10/05/21 0610 10/06/21 0659 10/07/21 0654 10/09/21 0501 10/10/21 0725  PLT 101* 110* 109* 120* 114*   Goals of care Debility Deconditioning: Partial code,continue PT OT plan for skilled nursing facility soon. Overall Prognosis  is not bright.  Pressure injury  POA see below Pressure Injury 09/29/21 Buttocks Right;Mid Stage 2 -  Partial thickness loss of dermis presenting as a shallow open injury with a red, pink wound bed without slough. (Active)  09/29/21 0230  Location: Buttocks  Location Orientation: Right;Mid  Staging: Stage 2 -  Partial thickness loss of dermis presenting as a shallow open injury with a red, pink wound bed without slough.  Wound Description (Comments):   Present on Admission: Yes     Pressure Injury 09/29/21 Buttocks Left;Mid Stage 2 -  Partial thickness loss of dermis presenting as a shallow open injury with a red, pink wound bed without slough. (Active)  09/29/21 0230  Location: Buttocks  Location Orientation: Left;Mid  Staging: Stage 2 -  Partial thickness loss of dermis presenting as a shallow open injury with a red, pink wound bed without slough.  Wound Description (Comments):   Present on Admission: Yes  DVT prophylaxis: heparin injection 5,000 Units Start: 09/29/21 0000 Code Status:   Code Status: Partial Code Family Communication: plan of care discussed with patient at bedside. Updated daughter on the phone.  Status UE:[id number]. Remains inpatient appropriate because: for ongoing deconditioning, Disposition:Currently not medically stable for discharge. Anticipated Disposition:SNF hopefully in 24 hrs once bed available  Total time spent in the care of this patient 35 MINUTES Objective: Vitals last 24 hrs: Vitals:   10/09/21 1913 10/10/21 0014 10/10/21 0350 10/10/21 0720  BP: 100/63 97/60 98/63  98/66  Pulse: 64 (!) 58 62 68  Resp: 18 16 16    Temp: 97.6 F (36.4 C) (!) 97.5 F (36.4 C) 97.8 F (36.6 C) 97.8 F (36.6 C)  TempSrc:  Oral Oral   SpO2: 100% 100% (!) 84% 99%  Weight:      Height:       Weight change:   Intake/Output Summary (Last 24 hours) at 10/10/2021 1016 Last data filed at 10/10/2021 0600 Gross per 24 hour  Intake 480 ml  Output 1650 ml  Net -1170  ml  Net IO Since Admission: -10,091.12 mL [10/10/21 1016]   Physical Examination: General exam:AA0 to self place. Weak, older than stated age. HEENT:Oral mucosa moist,Ear/Nose WNL grossly,dentition normal. Respiratory system: B/l diminished BS,no use of accessory muscle, non tender. Cardiovascular system:S1 & S2 +,No JVD. Gastrointestinal system:Abdomen soft, NT,ND,BS+. Nervous System:Alert, awake, moving extremities. Extremities:Edema none, distal peripheral pulses palpable.  Skin:No rashes, no icterus. JYN:WGNF muscle bulk, tone,power.  Medications reviewed:  Scheduled Meds:  Chlorhexidine Gluconate Cloth  6 each Topical Q0600   feeding supplement  1 Container Oral TID BM   heparin  5,000 Units Subcutaneous Q8H   levothyroxine  75 mcg Oral Q0600   liothyronine  5 mcg Oral Daily   midodrine  10 mg Oral TID WC   multivitamin with minerals  1 tablet Oral Daily   mupirocin ointment   Nasal BID   torsemide  40 mg Oral BID   Continuous Infusions:  Diet Order             Diet 2 gram sodium Room service appropriate? Yes; Fluid consistency: Thin  Diet effective now  Nutrition Problem: Increased nutrient needs Etiology: chronic illness (CHF) Signs/Symptoms: estimated needs Interventions: Ensure Enlive (each supplement provides 350kcal and 20 grams of protein), MVI, Liberalize Diet  Weight change:   Wt Readings from Last 3 Encounters:  10/09/21 71.9 kg  08/21/21 69.4 kg  08/03/21 64.9 kg     Consultants:see note  Procedures:see note Antimicrobials: Anti-infectives (From admission, onward)    Start     Dose/Rate Route Frequency Ordered Stop   09/29/21 0738  vancomycin variable dose per unstable renal function (pharmacist dosing)  Status:  Discontinued         Does not apply See admin instructions 09/29/21 0738 09/29/21 1004   09/29/21 0600  vancomycin (VANCOREADY) IVPB 1750 mg/350 mL        1,750 mg 175 mL/hr over 120 Minutes Intravenous  Once  09/29/21 0455 09/29/21 0822   09/29/21 0545  ceFAZolin (ANCEF) IVPB 1 g/50 mL premix  Status:  Discontinued        1 g 100 mL/hr over 30 Minutes Intravenous Every 12 hours 09/29/21 0454 09/29/21 1004      Culture/Microbiology    Component Value Date/Time   SDES BLOOD LEFT FOREARM 09/28/2021 2147   SDES BLOOD LEFT FOREARM 09/28/2021 2147   SPECREQUEST  09/28/2021 2147    BOTTLES DRAWN AEROBIC AND ANAEROBIC Blood Culture adequate volume   SPECREQUEST IN PEDIATRIC BOTTLE Blood Culture adequate volume 09/28/2021 2147   CULT  09/28/2021 2147    NO GROWTH 5 DAYS Performed at [facility], [address] [place], [place] [address]    CULT  09/28/2021 2147    NO GROWTH 5 DAYS Performed at [facility], [address] [place], [place] [address]    REPTSTATUS 10/03/2021 FINAL 09/28/2021 2147   REPTSTATUS 10/03/2021 FINAL 09/28/2021 2147    Other culture-see note  Unresulted Labs (From admission, onward)    None     Data Reviewed: I have personally reviewed following labs and imaging studies CBC: Recent Labs  Lab 10/05/21 0610 10/06/21 0659 10/07/21 0654 10/09/21 0501 10/10/21 0725  WBC 6.2 7.7 7.9 6.6 6.0  NEUTROABS  --   --   --   --  4.1  HGB 8.7* 9.0* 9.2* 8.9* 9.0*  HCT 25.1* 26.0* 27.3* 26.5* 27.2*  MCV 102.4* 100.4* 102.6* 102.7* 104.2*  PLT 101* 110* 109* 120* 841*   Basic Metabolic Panel: Recent Labs  Lab 10/06/21 0659 10/07/21 0654 10/08/21 0603 10/09/21 0501 10/10/21 0725  NA 139 140 140 138 138  K 3.6 3.9 3.5 4.0 3.6  CL 106 103 103 104 101  CO2 21* 25 23 24 25   GLUCOSE 93 92 86 93 92  BUN 121* 121* 117* 108* 119*  CREATININE 4.42* 4.06* 3.89* 3.83* 3.63*  CALCIUM 7.9* 8.1* 8.0* 8.0* 8.1*  PHOS  --   --   --  5.1*  --    GFR: Estimated Creatinine Clearance: 14.3 mL/min (A) (by C-G formula based on SCr of 3.63 mg/dL (H)). Liver Function Tests: Recent Labs  Lab 10/07/21 0654 10/09/21 0501  AST 35  --   ALT 27  --    ALKPHOS 61  --   BILITOT 1.0  --   PROT 6.5  --   ALBUMIN 3.1* 3.0*   No results for input(s): LIPASE, AMYLASE in the last 168 hours. No results for input(s): AMMONIA in the last 168 hours. Coagulation Profile: No results for input(s): INR, PROTIME in the last 168 hours. Cardiac Enzymes: No results for  input(s): CKTOTAL, CKMB, CKMBINDEX, TROPONINI in the last 168 hours. BNP (last 3 results) No results for input(s): PROBNP in the last 8760 hours. HbA1C: No results for input(s): HGBA1C in the last 72 hours. CBG: Recent Labs  Lab 10/05/21 2000 10/06/21 0804  GLUCAP 135* 108*   Lipid Profile: No results for input(s): CHOL, HDL, LDLCALC, TRIG, CHOLHDL, LDLDIRECT in the last 72 hours. Thyroid Function Tests: No results for input(s): TSH, T4TOTAL, FREET4, T3FREE, THYROIDAB in the last 72 hours. Anemia Panel: No results for input(s): VITAMINB12, FOLATE, FERRITIN, TIBC, IRON, RETICCTPCT in the last 72 hours. Sepsis Labs: No results for input(s): PROCALCITON, LATICACIDVEN in the last 168 hours.  No results found for this or any previous visit (from the past 240 hour(s)).   Radiology Studies: No results found.   LOS: 12 days   Antonieta Pert, MD [facility]  10/10/2021, 10:16 AM

## 2021-10-11 DIAGNOSIS — E035 Myxedema coma: Secondary | ICD-10-CM | POA: Diagnosis not present

## 2021-10-11 DIAGNOSIS — Z7189 Other specified counseling: Secondary | ICD-10-CM | POA: Diagnosis not present

## 2021-10-11 LAB — BASIC METABOLIC PANEL
Anion gap: 11 (ref 5–15)
BUN: 115 mg/dL — ABNORMAL HIGH (ref 8–23)
CO2: 24 mmol/L (ref 22–32)
Calcium: 8.2 mg/dL — ABNORMAL LOW (ref 8.9–10.3)
Chloride: 102 mmol/L (ref 98–111)
Creatinine, Ser: 3.54 mg/dL — ABNORMAL HIGH (ref 0.61–1.24)
GFR, Estimated: 16 mL/min — ABNORMAL LOW (ref >60)
Glucose, Bld: 89 mg/dL (ref 70–99)
Potassium: 3.9 mmol/L (ref 3.5–5.1)
Sodium: 137 mmol/L (ref 135–145)

## 2021-10-11 MED ORDER — ZINC SULFATE 220 (50 ZN) MG PO CAPS
220.0000 mg | ORAL_CAPSULE | Freq: Every day | ORAL | Status: DC
  Administered 2021-10-11 – 2021-10-16 (×6): 220 mg via ORAL
  Filled 2021-10-11 (×6): qty 1

## 2021-10-11 MED ORDER — PROSOURCE PLUS PO LIQD
30.0000 mL | Freq: Two times a day (BID) | ORAL | Status: DC
  Administered 2021-10-11 – 2021-10-16 (×9): 30 mL via ORAL
  Filled 2021-10-11 (×11): qty 30

## 2021-10-11 MED ORDER — ASCORBIC ACID 500 MG PO TABS
500.0000 mg | ORAL_TABLET | Freq: Two times a day (BID) | ORAL | Status: DC
  Administered 2021-10-11 – 2021-10-16 (×11): 500 mg via ORAL
  Filled 2021-10-11 (×11): qty 1

## 2021-10-11 NOTE — Progress Notes (Signed)
[facility]  ROUNDING NOTE   Subjective:   Patient seen sitting up in bed, eating breakfast Denies shortness of breath Denies pain and discomfort Tolerating meals without nausea and vomiting  Creatinine 3.54 Urine output recorded at 1.4 L in 24 hours  Objective:  Vital signs in last 24 hours:  Temp:  [97.4 F (36.3 C)-98.3 F (36.8 C)] 97.5 F (36.4 C) (01/25 1119) Pulse Rate:  [62-67] 64 (01/25 1119) Resp:  [20] 20 (01/24 1725) BP: (88-102)/(60-68) 88/60 (01/25 1119) SpO2:  [99 %-100 %] 100 % (01/25 1119) Weight:  [67.4 kg] 67.4 kg (01/25 0500)  Weight change:  Filed Weights   10/08/21 0500 10/09/21 0500 10/11/21 0500  Weight: 72.1 kg 71.9 kg 67.4 kg    Intake/Output: I/O last 3 completed shifts: In: 120 [P.O.:120] Out: 2400 [Urine:2400]   Intake/Output this shift:  Total I/O In: 720 [P.O.:720] Out: 600 [Urine:600]  Physical Exam: General: In no acute distress  Head: Normocephalic, atraumatic. Moist oral mucosal membranes  Eyes: Anicteric  Lungs:  Respirations even, Clear to auscultation, [place] O2  Heart: Regular rate and rhythm  Abdomen:  Soft, nontender, bowel sounds present  Extremities: No peripheral edema  Neurologic: Awake, alert, following commands,fogetful  Skin: Bilateral arms with ecchymotic areas   Access: NONE    Basic Metabolic Panel: Recent Labs  Lab 10/07/21 0654 10/08/21 0603 10/09/21 0501 10/10/21 0725 10/11/21 0847  NA 140 140 138 138 137  K 3.9 3.5 4.0 3.6 3.9  CL 103 103 104 101 102  CO2 25 23 24 25 24   GLUCOSE 92 86 93 92 89  BUN 121* 117* 108* 119* 115*  CREATININE 4.06* 3.89* 3.83* 3.63* 3.54*  CALCIUM 8.1* 8.0* 8.0* 8.1* 8.2*  PHOS  --   --  5.1*  --   --      Liver Function Tests: Recent Labs  Lab 10/07/21 0654 10/09/21 0501  AST 35  --   ALT 27  --   ALKPHOS 61  --   BILITOT 1.0  --   PROT 6.5  --   ALBUMIN 3.1* 3.0*    No results for input(s): LIPASE, AMYLASE in the last 168 hours. No  results for input(s): AMMONIA in the last 168 hours.  CBC: Recent Labs  Lab 10/05/21 0610 10/06/21 0659 10/07/21 0654 10/09/21 0501 10/10/21 0725  WBC 6.2 7.7 7.9 6.6 6.0  NEUTROABS  --   --   --   --  4.1  HGB 8.7* 9.0* 9.2* 8.9* 9.0*  HCT 25.1* 26.0* 27.3* 26.5* 27.2*  MCV 102.4* 100.4* 102.6* 102.7* 104.2*  PLT 101* 110* 109* 120* 114*     Cardiac Enzymes: No results for input(s): CKTOTAL, CKMB, CKMBINDEX, TROPONINI in the last 168 hours.  BNP: Invalid input(s): POCBNP  CBG: Recent Labs  Lab 10/05/21 2000 10/06/21 0804  GLUCAP 135* 108*     Microbiology: Results for orders placed or performed during the hospital encounter of 09/28/21  Resp Panel by RT-PCR (Flu A&B, Covid) Nasopharyngeal Swab     Status: None   Collection Time: 09/28/21  8:19 PM   Specimen: Nasopharyngeal Swab; Nasopharyngeal(NP) swabs in vial transport medium  Result Value Ref Range Status   SARS Coronavirus 2 by RT PCR NEGATIVE NEGATIVE Final    Comment: (NOTE) SARS-CoV-2 target nucleic acids are NOT DETECTED.  The SARS-CoV-2 RNA is generally detectable in upper respiratory specimens during the acute phase of infection. The lowest concentration of SARS-CoV-2 viral copies this assay can detect is  138 copies/mL. A negative result does not preclude SARS-Cov-2 infection and should not be used as the sole basis for treatment or other patient management decisions. A negative result may occur with  improper specimen collection/handling, submission of specimen other than nasopharyngeal swab, presence of viral mutation(s) within the areas targeted by this assay, and inadequate number of viral copies(<138 copies/mL). A negative result must be combined with clinical observations, patient history, and epidemiological information. The expected result is Negative.  Fact Sheet for Patients:  EntrepreneurPulse.com.au  Fact Sheet for Healthcare Providers:   IncredibleEmployment.be  This test is no t yet approved or cleared by the [place] FDA and  has been authorized for detection and/or diagnosis of SARS-CoV-2 by FDA under an Emergency Use Authorization (EUA). This EUA will remain  in effect (meaning this test can be used) for the duration of the COVID-19 declaration under Section 564(b)(1) of the Act, 21 U.S.C.section 360bbb-3(b)(1), unless the authorization is terminated  or revoked sooner.       Influenza A by PCR NEGATIVE NEGATIVE Final   Influenza B by PCR NEGATIVE NEGATIVE Final    Comment: (NOTE) The Xpert Xpress SARS-CoV-2/FLU/RSV plus assay is intended as an aid in the diagnosis of influenza from Nasopharyngeal swab specimens and should not be used as a sole basis for treatment. Nasal washings and aspirates are unacceptable for Xpert Xpress SARS-CoV-2/FLU/RSV testing.  Fact Sheet for Patients: EntrepreneurPulse.com.au  Fact Sheet for Healthcare Providers: IncredibleEmployment.be  This test is not yet approved or cleared by the [place] FDA and has been authorized for detection and/or diagnosis of SARS-CoV-2 by FDA under an Emergency Use Authorization (EUA). This EUA will remain in effect (meaning this test can be used) for the duration of the COVID-19 declaration under Section 564(b)(1) of the Act, 21 U.S.C. section 360bbb-3(b)(1), unless the authorization is terminated or revoked.  Performed at [facility], [address] [place], [place] [address]   Culture, blood (routine x 2)     Status: None   Collection Time: 09/28/21  9:47 PM   Specimen: BLOOD  Result Value Ref Range Status   Specimen Description BLOOD LEFT FOREARM  Final   Special Requests   Final    BOTTLES DRAWN AEROBIC AND ANAEROBIC Blood Culture adequate volume   Culture   Final    NO GROWTH 5 DAYS Performed at [facility], [address] [place], [place]  [address]    Report Status 10/03/2021 FINAL  Final  Culture, blood (routine x 2)     Status: None   Collection Time: 09/28/21  9:47 PM   Specimen: BLOOD  Result Value Ref Range Status   Specimen Description BLOOD LEFT FOREARM  Final   Special Requests IN PEDIATRIC BOTTLE Blood Culture adequate volume  Final   Culture   Final    NO GROWTH 5 DAYS Performed at [facility], [address], [place], [place] [address]    Report Status 10/03/2021 FINAL  Final  MRSA Next Gen by PCR, Nasal     Status: Abnormal   Collection Time: 09/29/21  2:38 AM   Specimen: Nasal Mucosa; Nasal Swab  Result Value Ref Range Status   MRSA by PCR Next Gen DETECTED (A) NOT DETECTED Final    Comment: RESULT CALLED TO, READ BACK BY AND VERIFIED WITH: Hansel Starling @0353  on 09/29/21 SKL (NOTE) The GeneXpert MRSA Assay (FDA approved for NASAL specimens only), is one component of a comprehensive MRSA colonization surveillance program. It is not  intended to diagnose MRSA infection nor to guide or monitor treatment for MRSA infections. Test performance is not FDA approved in patients less than 81 years old. Performed at [facility], [address] [name], [place] [address]     Coagulation Studies: No results for input(s): LABPROT, INR in the last 72 hours.   Urinalysis: No results for input(s): COLORURINE, LABSPEC, PHURINE, GLUCOSEU, HGBUR, BILIRUBINUR, KETONESUR, PROTEINUR, UROBILINOGEN, NITRITE, LEUKOCYTESUR in the last 72 hours.  Invalid input(s): APPERANCEUR     Imaging: No results found.   Medications:      (feeding supplement) PROSource Plus  30 mL Oral BID BM   vitamin C  500 mg Oral BID   aspirin EC  81 mg Oral Daily   calcitRIOL  0.25 mcg Oral Daily   Chlorhexidine Gluconate Cloth  6 each Topical Q0600   heparin  5,000 Units Subcutaneous Q8H   levothyroxine  75 mcg Oral Q0600   liothyronine  5 mcg Oral Daily   midodrine  10 mg Oral TID WC   multivitamin with minerals   1 tablet Oral Daily   mupirocin ointment   Nasal BID   pravastatin  20 mg Oral QPM   torsemide  40 mg Oral BID   zinc sulfate  220 mg Oral Daily   docusate sodium, polyethylene glycol  Assessment/ Plan:   Mr. KEBRON PULSE is a 88 y.o. white male with hypertension, coronary artery disease, aortic valve replacement, osteoarthritis, diverticulosis, ruptured abdominal viscus, history of hyponatremia, history of hiatal hernia, abdominal aortic aneurysm, constipation, right hip fracture status post repair, chronic systolic heart failure who presents to [facility] on 09/28/2021 for Cardiogenic shock ([place]) [R57.0] Anasarca [R60.1] Uremia [N19] Junctional bradycardia [R00.1] Acute on chronic systolic congestive heart failure (HCC) [I50.23] Chronic kidney disease, unspecified CKD stage [N18.9]  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 Started on IV levothyroxine and now on levothyroxine 25mg  daily. Also on stress dose steroids.   1. Acute kidney injury on Chronic kidney disease stage V: baseline creatinine of 3.9 on 09/04/21. Patient with chronic kidney disease secondary to hypertension and vascular disease. Acute kidney injury secondary to acute exacerbation of chronic systolic congestive heart failure: Family was interested in peritoneal dialysis prior to admission. Needed cardiology clearance before proceeding with PD catheter placement, possibly not a dialysis candidate, due to weakened cardiac state.Palliative care to continue goals fo care discussion with family and patient Lab Results  Component Value Date   CREATININE 3.54 (H) 10/11/2021   CREATININE 3.63 (H) 10/10/2021   CREATININE 3.83 (H) 10/09/2021  Continue Torsemide 40 mg PO  BID  Creatinine improving with adequate urine output recorded  Palliative care following to establish goals of care  2. Hypotension: with acute exacerbation of systolic congestive heart failure: echo from 01/31/21 with Ejection Fraction of <20%. Cardiogenic  shock. Holding home regimen of losartan and PO torsemide.   - ECHO on 10/03/21 shows EF <20% with global hypokinesis - Continue diuresis with oral torsemide BP remains soft at 88/60, continue midodrine 10 mg TID  3. Anemia with chronic kidney disease Lab Results  Component Value Date   HGB 9.0 (L) 10/10/2021  We will continue to monitor  4. Hyponatremia: secondary to chronic kidney disease and congestive heart failure.   Sodium remains 137  5. Secondary Hyperparathyroidism: PTH 110 on 08/31/21. Lab Results  Component Value Date   CALCIUM 8.2 (L) 10/11/2021   CAION 1.12 02/23/2010   PHOS 5.1 (H) 10/09/2021    We  will continue to monitor bone minerals during this admission   LOS: [place] 1/25/20232:50 PM

## 2021-10-11 NOTE — Progress Notes (Signed)
Nutrition Follow-up  DOCUMENTATION CODES:   Not applicable  INTERVENTION:   -D/c Boost Breeze -Continue MVI with minerals daily -30 ml Prosource Plus TID, each supplement provides 100 kcals and 15 grams protein -500 mg vitamin C BID -220 mg zinc sulfate daily x 14 days  NUTRITION DIAGNOSIS:   Increased nutrient needs related to chronic illness (CHF) as evidenced by estimated needs.  Ongoing  GOAL:   Patient will meet greater than or equal to 90% of their needs  Progressing   MONITOR:   PO intake, Supplement acceptance, Labs, Weight trends, I & O's, Skin  REASON FOR ASSESSMENT:   Malnutrition Screening Tool    ASSESSMENT:   88 y.o male with significant PMH of  CKD stage IV, hypertension, proteinuria, hyperparathyroidism due to renal insufficiency, hyperlipidemia, aspiration pneumonia, Systolic HF EF of 06-26%, ischemic cardiomyopathy, bilateral carotid stenosis, severe aortic stenosis, AAA, MGUS, paroxysmal SVT, CABG status post LIMA to LAD, SVG to OM1, OM2, D1, PDA, status post bioprosthetic valve replacement in 2011 who presented to the ED with unresponsiveness.  Reviewed I/O's: -1.3 L x 24 hours and -11.4 L since admission  UOP: 1.4 L x 24 hours  Pt refusing Boost Breeze supplements. He remains on a 2 gram sodium diet. Noted meal completions 25-100%.   Palliative care following for goals of care discussions. Per nephrology notes, pt with CKD and may not be an HD candidate (also need cardiology clearance prior to PD cath placement).  Pt is DNI/DNR and does not desire feeding tube placement. Plan for outpatient palliative care with plans to transition to hospice when ready.   Labs reviewed: CBGS: 108 (inpatient orders for glycemic control are none).    Diet Order:   Diet Order             Diet 2 gram sodium Room service appropriate? Yes; Fluid consistency: Thin  Diet effective now                   EDUCATION NEEDS:   Education needs have been  addressed  Skin:  Skin Assessment: Skin Integrity Issues: Skin Integrity Issues:: Stage II Stage II: bilateral buttocks  Last BM:  10/09/21  Height:   Ht Readings from Last 1 Encounters:  09/29/21 6' (1.829 m)    Weight:   Wt Readings from Last 1 Encounters:  10/11/21 67.4 kg    Ideal Body Weight:  80.9 kg  BMI:  Body mass index is 20.15 kg/m.  Estimated Nutritional Needs:   Kcal:  2150-2350  Protein:  100-115 grams  Fluid:  > 2 L    Loistine Chance, RD, LDN, [place] Registered Dietitian II Certified Diabetes Care and Education Specialist Please refer to [facility] for RD and/or RD on-call/weekend/after hours pager

## 2021-10-11 NOTE — Progress Notes (Signed)
PROGRESS NOTE    OAKLAND FANT  ZOX:[id number] DOB: 1930-10-17 DOA: 09/28/2021 PCP: Baxter Hire, MD   Chief Complaint  Patient presents with   Bradycardia   unresponsive  Brief Narrative/Hospital Course: [name], 88 y.o. male with PMH of  with CKD V, baseline 3.9, sCHF EF <20% due to ischemic CM, AS and CAD s/p CABG and AVR 2011, pAF not on [facility],  and PVD who presented to the ED with unresponsiveness.  Patient had gone to Pulmonary clinic for evaluation of dyspnea, fatigue, was told to go to the ER  Reportedly, family took patient to eat and then home, where he became unresponsive, EMS activated.  EMS found in a chair at home with HR 20s and BP was 80/60, hypothermic, started pacing, started Levophed and transported.  In the ER he was lethargic, somnolent and getting externally paced.  ECG showed junctional bradycardia. Hypothermic, hypotensive, CXR with bilateral effusions and edema.  Cr 4.69 BNP: 2137, TSH: 213.  He was started on levothyroxine, hydrocortisone and Levophed and admitted to ICU, Cardiology and Nephrology consulted .  At this time patient overall stable and improving tolerating diuretics, PT OT has recommended skilled nursing facility, seen by palliative care   Subjective: Seen thsi am Having breakfast, no complaints, is alert awake and oriented Overnight afebrile blood pressure 90s to 100 BMP with overall stable creatinine at 3.5.  Assessment & Plan:  Myxedema coma:Presented with unresponsiveness severe bradycardia requiring external pacing, hypothermia.  TSH was 213 Free T4 less than 0.2 S/P IV Synthroid, IV hydrocortisone repeat TSH/free T4 has improved to 3 still low.  Continue with current levothyroxine and liothyronine. Repeat TST/FT4/FT3 in 2-3 wks  Acute on chronic systolic CHF Cardiogenic shock: Dilated cardiomyopathy: Volume status significantly improved , BP soft remains on midodrine.  Continue oral diuretics on torsemide 40 twice daily.  Holding  losartan due to blood pressure being soft, cardiology has seen and signed off.  Monitor intake output , and weight as below-weight on admit 171 lb> 158> 148.5 lb.Net IO Since Admission: -11,371.12 mL [10/11/21 0915]  Filed Weights   10/08/21 0500 10/09/21 0500 10/11/21 0500  Weight: 72.1 kg 71.9 kg 67.4 kg    MR w/ dilated cardiomyopathy: Not amenable to MitraClip due to DCM.  Follow-up with cardiology for further recommendation  CAD with CABG x5 in 2011 HLD AAA 3.2 cm in 2020: Seen by cardiology this admission,Echo showed EF less than 20%.  Cardiology encouraged goals of care discussion.Continue aspirin and statin.  AKI on CKD stage IV: Baseline creatinine around 3.9 followed by nephrology continue diuretics with torsemide per nephrology monitor intake output, labs this morning shows creatinine at 3.5 BUN 119.Does not appear to be ideal candidate for dialysis will defer to nephrology to address as outpatient. Recent Labs  Lab 10/06/21 0659 10/07/21 0654 10/08/21 0603 10/09/21 0501 10/10/21 0725  BUN 121* 121* 117* 108* 119*  CREATININE 4.42* 4.06* 3.89* 3.83* 3.63*    Benign essential HTN: BP is soft, continue midodrine  Hyponatremia:Stable  Acute respiratory failure with hypoxia: Due to patient's CHF.  Continue supplemental oxygen to maintain saturation at least 93% or above  PAF: Rate controlled, not on anticoagulation at this time due to patient's preference amiodarone on hold  Demand ischemia: In the setting of CHF. Anemia in other chronic diseases -hemoglobin stable in the setting of CKD and hypothyroidism.  Monitor   Recent Labs  Lab 10/05/21 0610 10/06/21 0659 10/07/21 0654 10/09/21 0501 10/10/21 0725  HGB 8.7*  9.0* 9.2* 8.9* 9.0*  HCT 25.1* 26.0* 27.3* 26.5* 27.2*    Thrombocytopenia: Due to acute illness.  Stable Recent Labs  Lab 10/05/21 0610 10/06/21 0659 10/07/21 0654 10/09/21 0501 10/10/21 0725  PLT 101* 110* 109* 120* 114*   Goals of  care Debility Deconditioning: Partial code,continue PT OT plan for skilled nursing facility soon. Overall prognosis does not appear bright, he is guarded given complex comorbidities severe systolic dysfunction with CKD.  Palliative care following, MOST form completed 1/24, currently is DNR.  Patient may benefit with transition to hospice level care when patient family is ready  Pressure injury  POA see below Pressure Injury 09/29/21 Buttocks Right;Mid Stage 2 -  Partial thickness loss of dermis presenting as a shallow open injury with a red, pink wound bed without slough. (Active)  09/29/21 0230  Location: Buttocks  Location Orientation: Right;Mid  Staging: Stage 2 -  Partial thickness loss of dermis presenting as a shallow open injury with a red, pink wound bed without slough.  Wound Description (Comments):   Present on Admission: Yes     Pressure Injury 09/29/21 Buttocks Left;Mid Stage 2 -  Partial thickness loss of dermis presenting as a shallow open injury with a red, pink wound bed without slough. (Active)  09/29/21 0230  Location: Buttocks  Location Orientation: Left;Mid  Staging: Stage 2 -  Partial thickness loss of dermis presenting as a shallow open injury with a red, pink wound bed without slough.  Wound Description (Comments):   Present on Admission: Yes  DVT prophylaxis: heparin injection 5,000 Units Start: 09/29/21 0000 Code Status:   Code Status: DNR Family Communication: plan of care discussed with patient at bedside. Updated daughter on the phone 1/24.  Status GU:[id number]. Remains inpatient appropriate because: for ongoing deconditioning, Disposition:Currently not medically stable for discharge. Anticipated Disposition:SNF for short term rehab once okay w/ nephrology hopefully in next 24 hrs or so.  Total time spent in the care of this patient 35 MINUTES Objective: Vitals last 24 hrs: Vitals:   10/10/21 2031 10/11/21 0044 10/11/21 0500 10/11/21 0729  BP: 102/64  100/64  99/68  Pulse: 64 62  67  Resp:      Temp: 98 F (36.7 C) 98.3 F (36.8 C)  (!) 97.4 F (36.3 C)  TempSrc:      SpO2: 100% 100%  100%  Weight:   67.4 kg   Height:       Weight change:   Intake/Output Summary (Last 24 hours) at 10/11/2021 0915 Last data filed at 10/11/2021 0500 Gross per 24 hour  Intake 120 ml  Output 1400 ml  Net -1280 ml  Net IO Since Admission: -11,371.12 mL [10/11/21 0915]   Physical Examination: General exam: AAOx 3, weak, frail pleasant HEENT:Oral mucosa moist, Ear/Nose WNL grossly, dentition normal. Respiratory system: bilaterally clear ,no use of accessory muscle Cardiovascular system: S1 & S2 +, No JVD,. Gastrointestinal system: Abdomen soft,NT,ND, BS+ Nervous System:Alert, awake, moving extremities and grossly nonfocal Extremities: no edema, distal peripheral pulses palpable.  Skin: No rashes,no icterus. MSK: Normal muscle bulk,tone, power  Medications reviewed:  Scheduled Meds:  aspirin EC  81 mg Oral Daily   calcitRIOL  0.25 mcg Oral Daily   Chlorhexidine Gluconate Cloth  6 each Topical Q0600   feeding supplement  1 Container Oral TID BM   heparin  5,000 Units Subcutaneous Q8H   levothyroxine  75 mcg Oral Q0600   liothyronine  5 mcg Oral Daily   midodrine  10 mg Oral  TID WC   multivitamin with minerals  1 tablet Oral Daily   mupirocin ointment   Nasal BID   pravastatin  20 mg Oral QPM   torsemide  40 mg Oral BID   Continuous Infusions:  Diet Order             Diet 2 gram sodium Room service appropriate? Yes; Fluid consistency: Thin  Diet effective now                   Nutrition Problem: Increased nutrient needs Etiology: chronic illness (CHF) Signs/Symptoms: estimated needs Interventions: Ensure Enlive (each supplement provides 350kcal and 20 grams of protein), MVI, Liberalize Diet  Weight change:   Wt Readings from Last 3 Encounters:  10/11/21 67.4 kg  08/21/21 69.4 kg  08/03/21 64.9 kg     Consultants:see  note  Procedures:see note Antimicrobials: Anti-infectives (From admission, onward)    Start     Dose/Rate Route Frequency Ordered Stop   09/29/21 0738  vancomycin variable dose per unstable renal function (pharmacist dosing)  Status:  Discontinued         Does not apply See admin instructions 09/29/21 0738 09/29/21 1004   09/29/21 0600  vancomycin (VANCOREADY) IVPB 1750 mg/350 mL        1,750 mg 175 mL/hr over 120 Minutes Intravenous  Once 09/29/21 0455 09/29/21 0822   09/29/21 0545  ceFAZolin (ANCEF) IVPB 1 g/50 mL premix  Status:  Discontinued        1 g 100 mL/hr over 30 Minutes Intravenous Every 12 hours 09/29/21 0454 09/29/21 1004      Culture/Microbiology    Component Value Date/Time   SDES BLOOD LEFT FOREARM 09/28/2021 2147   SDES BLOOD LEFT FOREARM 09/28/2021 2147   SPECREQUEST  09/28/2021 2147    BOTTLES DRAWN AEROBIC AND ANAEROBIC Blood Culture adequate volume   SPECREQUEST IN PEDIATRIC BOTTLE Blood Culture adequate volume 09/28/2021 2147   CULT  09/28/2021 2147    NO GROWTH 5 DAYS Performed at [facility], [address] [place], [place] [address]    CULT  09/28/2021 2147    NO GROWTH 5 DAYS Performed at [facility], [address] [place], [place] [address]    REPTSTATUS 10/03/2021 FINAL 09/28/2021 2147   REPTSTATUS 10/03/2021 FINAL 09/28/2021 2147    Other culture-see note  Unresulted Labs (From admission, onward)     Start     Ordered   10/12/21 6734  Basic metabolic panel  Daily,   R     Question:  Specimen collection method  Answer:  Lab=Lab collect   10/11/21 0746   10/11/21 1937  Basic metabolic panel  Once-Timed,   TIMED       Question:  Specimen collection method  Answer:  Lab=Lab collect   10/11/21 0746          Data Reviewed: I have personally reviewed following labs and imaging studies CBC: Recent Labs  Lab 10/05/21 0610 10/06/21 0659 10/07/21 0654 10/09/21 0501 10/10/21 0725  WBC 6.2 7.7 7.9 6.6 6.0   NEUTROABS  --   --   --   --  4.1  HGB 8.7* 9.0* 9.2* 8.9* 9.0*  HCT 25.1* 26.0* 27.3* 26.5* 27.2*  MCV 102.4* 100.4* 102.6* 102.7* 104.2*  PLT 101* 110* 109* 120* 902*   Basic Metabolic Panel: Recent Labs  Lab 10/06/21 0659 10/07/21 0654 10/08/21 0603 10/09/21 0501 10/10/21 0725  NA 139 140 140 138 138  K 3.6 3.9 3.5 4.0  3.6  CL 106 103 103 104 101  CO2 21* 25 23 24 25   GLUCOSE 93 92 86 93 92  BUN 121* 121* 117* 108* 119*  CREATININE 4.42* 4.06* 3.89* 3.83* 3.63*  CALCIUM 7.9* 8.1* 8.0* 8.0* 8.1*  PHOS  --   --   --  5.1*  --    GFR: Estimated Creatinine Clearance: 13.4 mL/min (A) (by C-G formula based on SCr of 3.63 mg/dL (H)). Liver Function Tests: Recent Labs  Lab 10/07/21 0654 10/09/21 0501  AST 35  --   ALT 27  --   ALKPHOS 61  --   BILITOT 1.0  --   PROT 6.5  --   ALBUMIN 3.1* 3.0*   No results for input(s): LIPASE, AMYLASE in the last 168 hours. No results for input(s): AMMONIA in the last 168 hours. Coagulation Profile: No results for input(s): INR, PROTIME in the last 168 hours. Cardiac Enzymes: No results for input(s): CKTOTAL, CKMB, CKMBINDEX, TROPONINI in the last 168 hours. BNP (last 3 results) No results for input(s): PROBNP in the last 8760 hours. HbA1C: No results for input(s): HGBA1C in the last 72 hours. CBG: Recent Labs  Lab 10/05/21 2000 10/06/21 0804  GLUCAP 135* 108*   Lipid Profile: No results for input(s): CHOL, HDL, LDLCALC, TRIG, CHOLHDL, LDLDIRECT in the last 72 hours. Thyroid Function Tests: No results for input(s): TSH, T4TOTAL, FREET4, T3FREE, THYROIDAB in the last 72 hours. Anemia Panel: No results for input(s): VITAMINB12, FOLATE, FERRITIN, TIBC, IRON, RETICCTPCT in the last 72 hours. Sepsis Labs: No results for input(s): PROCALCITON, LATICACIDVEN in the last 168 hours.  No results found for this or any previous visit (from the past 240 hour(s)).   Radiology Studies: No results found.   LOS: 13 days   Antonieta Pert,  MD [facility]  10/11/2021, 9:15 AM

## 2021-10-11 NOTE — Progress Notes (Addendum)
Daily Progress Note   Patient Name: [name]       Date: 10/11/2021 DOB: 10-29-1930  Age: 88 y.o. MRN#: [id number] Attending Physician: Antonieta Pert, MD Primary Care Physician: Baxter Hire, MD Admit Date: 09/28/2021  Reason for Consultation/Follow-up: Establishing goals of care  Subjective: Son called to speak with me. He discusses wanting to speak with cardiology in a face to face meeting with his sister. He is aware of his father's poor and limited prognosis. He states he and his sister need to hear together from cardiology directly his diagnoses, options/ or lack of options, and prognosis. He tells me he understands his father's level of CKD. Agricultural consultant paging cardiology.  Discussed patient autonomy in decision making and supporting his wishes.   Would recommend palliative to follow at D/C, with transition to hospice when they are ready.    Length of Stay: 13  Current Medications: Scheduled Meds:   aspirin EC  81 mg Oral Daily   calcitRIOL  0.25 mcg Oral Daily   Chlorhexidine Gluconate Cloth  6 each Topical Q0600   feeding supplement  1 Container Oral TID BM   heparin  5,000 Units Subcutaneous Q8H   levothyroxine  75 mcg Oral Q0600   liothyronine  5 mcg Oral Daily   midodrine  10 mg Oral TID WC   multivitamin with minerals  1 tablet Oral Daily   mupirocin ointment   Nasal BID   pravastatin  20 mg Oral QPM   torsemide  40 mg Oral BID    Continuous Infusions:   PRN Meds: docusate sodium, polyethylene glycol  Physical Exam Pulmonary:     Effort: Pulmonary effort is normal.  Neurological:     Mental Status: He is alert.            Vital Signs: BP (!) 88/60    Pulse 64    Temp (!) 97.5 F (36.4 C) (Oral)    Resp 20    Ht 6' (1.829 m)    Wt 67.4 kg    SpO2 100%     BMI 20.15 kg/m  SpO2: SpO2: 100 % O2 Device: O2 Device: Nasal Cannula O2 Flow Rate: O2 Flow Rate (L/min): 1 L/min  Intake/output summary:  Intake/Output Summary (Last 24 hours) at 10/11/2021 1247 Last data filed at 10/11/2021 1035  Gross per 24 hour  Intake 480 ml  Output 2000 ml  Net -1520 ml   LBM: Last BM Date: 10/09/21 Baseline Weight: Weight: 70 kg Most recent weight: Weight: 67.4 kg         Patient Active Problem List   Diagnosis Date Noted   Thrombocytopenia ([place]) 10/07/2021   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[place]) 10/06/2021   AF (paroxysmal atrial fibrillation) ([place]) 10/06/2021   Demand ischemia ([place]) 10/06/2021   Anemia in other chronic diseases classified elsewhere 10/06/2021   Cardiogenic shock ([place]) 09/28/2021   AKI (acute kidney injury) ([place]) 05/29/2021   Pressure injury of buttock, stage 2 ([place]) 02/01/2021   Acute respiratory failure with hypoxia ([place]) 09/07/2020   Elevated troponin 09/07/2020   Pleural effusion 09/07/2020   Acute on chronic systolic CHF (congestive heart failure) ([place]) 09/07/2020   Thoracic compression fracture ([place]) 09/07/2020   Hyponatremia 08/05/2020   MGUS (monoclonal gammopathy of unknown significance) 05/23/2016   S/P total knee arthroplasty 04/02/2016   CAD in native artery 11/22/2015   TI (tricuspid incompetence) 05/11/2015   Early satiety 01/25/2015   Benign essential HTN 01/03/2015   Abdominal aortic aneurysm (AAA) without rupture 07/09/2014   MI (mitral incompetence) 07/09/2014   Aortic heart valve narrowing 07/09/2014   Cardiomyopathy, ischemic 06/28/2014   Arthritis of knee, degenerative 05/17/2014   Kidney lump 04/09/2013   Benign prostatic hyperplasia with urinary obstruction 03/17/2013   CKD (chronic kidney disease), stage V ([place]) 03/17/2013   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      Code Status:    Code Status Orders  (From admission, onward)           Start     Ordered   10/10/21 1309  Do not attempt resuscitation (DNR)  Continuous       Question Answer Comment  In the event of cardiac or respiratory ARREST Do not call a code blue   In the event of cardiac or respiratory ARREST Do not perform Intubation, CPR, defibrillation or ACLS   In the event of cardiac or respiratory ARREST Use medication by any route, position, wound care, and other measures to relive pain and suffering. May use oxygen, suction and manual treatment of airway obstruction as needed for comfort.   Comments MOST form on chart.      10/10/21 1308           Code Status History     Date Active Date Inactive Code Status Order ID Comments User Context   09/29/2021 1005 10/10/2021 1308 Partial Code [id number]  Flora Lipps, MD Inpatient   09/28/2021 2357 09/29/2021 1004 Full Code [id number]  Lang Snow, NP ED   05/29/2021 2243 06/01/2021 2216 Partial Code [id number]  Cox, Amy N, DO ED   05/29/2021 2202 05/29/2021 2243 Full Code [id number]  Cox, Amy N, DO ED   02/06/2021 1027 02/08/2021 2050 DNR [id number]  Sidney Ace, MD Inpatient   02/06/2021 0909 02/06/2021 1026 Full Code [id number]  Sidney Ace, MD Inpatient   02/02/2021 1003 02/06/2021 0909 DNR [id number]  Sidney Ace, MD Inpatient   01/31/2021 1342 02/02/2021 1002 Full Code [id number]  Acheampong, Warnell Bureau, MD ED   09/07/2020 0430 09/10/2020 1628 Full Code [id number]  Vianne Bulls, MD ED   08/05/2020 2155 08/10/2020 2321 Full Code [id number]  Collier Bullock, MD ED   05/11/2020 2147 05/17/2020 2241 Full Code [id number]  Clarnce Flock, MD Inpatient   04/02/2016 1640 04/05/2016 1816 Full Code [id number]  Dereck Leep, MD Inpatient   05/19/2015 1121 05/20/2015 0326 Full Code [id number]  Sabino Dick, MD HOV      Advance Directive Documentation    Flowsheet Row Most Recent Value   Type of Advance Directive Living will  Pre-existing out of facility DNR order (yellow form or pink MOST form) --  "MOST" Form in Place? --       Prognosis:  < 6 months  Thank you for allowing the Palliative Medicine Team to assist in the care of this patient.       Total Time 25 min Prolonged Time Billed  no       Greater than 50%  of this time was spent counseling and coordinating care related to the above assessment and plan.  Asencion Gowda, NP  Please contact Palliative Medicine Team phone at [phone] for questions and concerns.

## 2021-10-11 NOTE — TOC Progression Note (Signed)
Transition of Care [facility]) - Progression Note    Patient Details  Name: [name] MRN: [id number] Date of Birth: 04/26/1931  [facility] CM/SW [place], [place] Phone Number: 10/11/2021, 2:56 PM  Clinical Narrative:      CSW spoke with [name] and Butch Penny, they report not all family members are in agreement with SNF vs Hospice, however right now they would like CSW to send out referrals to SNF to see if they can be accepted at [facility] and if insurance will cover SNF for short term rehab followed by outpatient palliative.    CSW has sent out referrals pending bed offer.       Expected Discharge Plan and Services                                                 Social Determinants of Health (SDOH) Interventions    Readmission Risk Interventions Readmission Risk Prevention Plan 05/31/2021  Transportation Screening Complete  PCP or Specialist Appt within 3-5 Days Complete  Social Work Consult for [employer] Planning/Counseling Complete  Palliative Care Screening Not Applicable  Medication Review Press photographer) Complete  Some recent data might be hidden

## 2021-10-11 NOTE — Progress Notes (Signed)
Physical Therapy Treatment Patient Details Name: [name] MRN: [id number] DOB: 18-Oct-1930 Today's Date: 10/11/2021   History of Present Illness Pt is an 88 y.o male with significant PMH of  CKD stage IV, hypertension, proteinuria, hyperparathyroidism, HLD, aspiration pneumonia, EF 15-20%, ischemic cardiomyopathy, bilateral carotid stenosis, severe aortic stenosis, AAA, MGUS, paroxysmal SVT, CABG, PDA, status post bioprosthetic valve replacement in 2011 who presented to the ED with unresponsiveness. MD assessment includes: Myxedema coma, cadiogenic shock, acute on chronic CHF, AKI, thrombocytopenia, anemia, A-fib, acute respiratory failure, pressure injury of the buttock stage 2, and hyponatremia.    PT Comments    Pt reluctant to participate with PT services initially but after providing encouragement and education on benefits of activity pt agreed and ended up putting forth very good effort. Pt required min A with bed mobility and transfers but was able to take several steps at the EOB with grossly improved stability and foot clearance compared to prior session. Pt remains functionally weak and at high risk for falls and will benefit from PT services in a SNF setting upon discharge to safely address deficits listed in patient problem list for decreased caregiver assistance and eventual return to PLOF.     Recommendations for follow up therapy are one component of a multi-disciplinary discharge planning process, led by the attending physician.  Recommendations may be updated based on patient status, additional functional criteria and insurance authorization.  Follow Up Recommendations  Skilled nursing-short term rehab (<3 hours/day)     Assistance Recommended at Discharge None  Patient can return home with the following Two people to help with walking and/or transfers;A lot of help with bathing/dressing/bathroom;Assistance with cooking/housework;Direct supervision/assist for medications  management;Direct supervision/assist for financial management;Assist for transportation   Equipment Recommendations  Other (comment) (TBD at next venue of care)    Recommendations for Other Services       Precautions / Restrictions Precautions Precautions: Fall Restrictions Weight Bearing Restrictions: No     Mobility  Bed Mobility Overal bed mobility: Needs Assistance Bed Mobility: Sit to Supine, Supine to Sit, Rolling Rolling: Min assist   Supine to sit: Min assist Sit to supine: Supervision   General bed mobility comments: Min A with BLE and trunk control during sup to sit but pt able to go sit to sup without physical assist with good effort and use of rail    Transfers Overall transfer level: Needs assistance Equipment used: Rolling walker (2 wheels) Transfers: Sit to/from Stand Sit to Stand: Min assist, From elevated surface           General transfer comment: Pt able to come to standing from an elevated surface with min A for stability    Ambulation/Gait Ambulation/Gait assistance: Min guard Gait Distance (Feet): 4 Feet Assistive device: Rolling walker (2 wheels) Gait Pattern/deviations: Step-to pattern, Shuffle, Trunk flexed Gait velocity: decreased     General Gait Details: Pt able to take shuffling steps forwards/backwards and side-stepping with very slow cadence and heavily flexed trunk   Stairs             Wheelchair Mobility    Modified Rankin (Stroke Patients Only)       Balance Overall balance assessment: Needs assistance   Sitting balance-Leahy Scale: Fair     Standing balance support: Bilateral upper extremity supported, During functional activity Standing balance-Leahy Scale: Fair  Cognition Arousal/Alertness: Awake/alert Behavior During Therapy: Flat affect Overall Cognitive Status: No family/caregiver present to determine baseline cognitive functioning                                           Exercises Total Joint Exercises Ankle Circles/Pumps: AROM, Strengthening, Both, 10 reps Quad Sets: Strengthening, Both, 10 reps Heel Slides: Strengthening, AAROM, Both, 5 reps Long Arc Quad: AROM, Strengthening, Both, 10 reps Knee Flexion: AROM, Strengthening, Both, 10 reps Marching in Standing: AROM, Strengthening, Both, 5 reps, Standing Other Exercises Other Exercises: Pt education provided on physiological benefits of activity    General Comments        Pertinent Vitals/Pain Pain Assessment Pain Assessment: No/denies pain    Home Living                          Prior Function            PT Goals (current goals can now be found in the care plan section) Progress towards PT goals: Progressing toward goals    Frequency    Min 2X/week      PT Plan Current plan remains appropriate    Co-evaluation              AM-PAC PT "6 Clicks" Mobility   Outcome Measure  Help needed turning from your back to your side while in a flat bed without using bedrails?: A Little Help needed moving from lying on your back to sitting on the side of a flat bed without using bedrails?: A Little Help needed moving to and from a bed to a chair (including a wheelchair)?: A Little Help needed standing up from a chair using your arms (e.g., wheelchair or bedside chair)?: A Little Help needed to walk in hospital room?: Total Help needed climbing 3-5 steps with a railing? : Total 6 Click Score: 14    End of Session Equipment Utilized During Treatment: Gait belt Activity Tolerance: Patient tolerated treatment well Patient left: with call bell/phone within reach;in bed;with bed alarm set Nurse Communication: Mobility status PT Visit Diagnosis: Unsteadiness on feet (R26.81);Difficulty in walking, not elsewhere classified (R26.2);Muscle weakness (generalized) (M62.81)     Time: 1610-9604 PT Time Calculation (min) (ACUTE ONLY): 26  min  Charges:  $Therapeutic Exercise: 8-22 mins $Therapeutic Activity: 8-22 mins                     D. Scott Diasia Henken PT, DPT 10/11/21, 4:02 PM

## 2021-10-11 NOTE — Care Management Important Message (Signed)
Important Message  Patient Details  Name: [name] MRN: [id number] Date of Birth: 10-Aug-1931   Medicare Important Message Given:  Yes     Dannette Barbara 10/11/2021, 11:47 AM

## 2021-10-11 NOTE — NC FL2 (Signed)
[place] LEVEL OF CARE SCREENING TOOL     IDENTIFICATION  Patient Name: [name] Birthdate: 1930/12/18 Sex: male Admission Date (Current Location): 09/28/2021  [facility] and [place] Number:  Engineering geologist and Address:  [facility], [name] [place], [place] [address]      Provider Number: [id number]  Attending Physician Name and Address:  Antonieta Pert, MD  Relative Name and Phone Number:  [name] (son) [phone]    Current Level of Care: Hospital Recommended Level of Care: [place] Prior Approval Number:    Date Approved/Denied:   PASRR Number: [id number]  Discharge Plan: SNF    Current Diagnoses: Patient Active Problem List   Diagnosis Date Noted   Thrombocytopenia ([place]) 10/07/2021   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[place]) 10/06/2021   AF (paroxysmal atrial fibrillation) ([place]) 10/06/2021   Demand ischemia ([place]) 10/06/2021   Anemia in other chronic diseases classified elsewhere 10/06/2021   Cardiogenic shock ([place]) 09/28/2021   AKI (acute kidney injury) ([place]) 05/29/2021   Pressure injury of buttock, stage 2 ([place]) 02/01/2021   Acute respiratory failure with hypoxia ([place]) 09/07/2020   Elevated troponin 09/07/2020   Pleural effusion 09/07/2020   Acute on chronic systolic CHF (congestive heart failure) ([place]) 09/07/2020   Thoracic compression fracture ([place]) 09/07/2020   Hyponatremia 08/05/2020   MGUS (monoclonal gammopathy of unknown significance) 05/23/2016   S/P total knee arthroplasty 04/02/2016   CAD in native artery 11/22/2015   TI (tricuspid incompetence) 05/11/2015   Early satiety 01/25/2015   Benign essential HTN 01/03/2015   Abdominal aortic aneurysm (AAA) without rupture 07/09/2014   MI (mitral incompetence) 07/09/2014   Aortic heart valve narrowing 07/09/2014   Cardiomyopathy, ischemic 06/28/2014   Arthritis of knee, degenerative 05/17/2014   Kidney lump 04/09/2013   Benign prostatic hyperplasia with  urinary obstruction 03/17/2013   CKD (chronic kidney disease), stage V ([place]) 03/17/2013   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    Orientation RESPIRATION BLADDER Height & Weight     Self, Place, Situation  O2 (1L nasal cannula) Incontinent, External catheter Weight: 148 lb 9.4 oz (67.4 kg) Height:  6' (182.9 cm)  BEHAVIORAL SYMPTOMS/MOOD NEUROLOGICAL BOWEL NUTRITION STATUS      Incontinent Diet (see discharge summary)  AMBULATORY STATUS COMMUNICATION OF NEEDS Skin   Extensive Assist Verbally Other (Comment) (pressure injury stage 2 buttocks)                       Personal Care Assistance Level of Assistance  Bathing, Feeding, Dressing, Total care Bathing Assistance: Maximum assistance Feeding assistance: Limited assistance Dressing Assistance: Maximum assistance Total Care Assistance: Maximum assistance   Functional Limitations Info  Sight, Hearing, Speech Sight Info: Impaired Hearing Info: Impaired Speech Info: Adequate    SPECIAL CARE FACTORS FREQUENCY  PT (By licensed PT), OT (By licensed OT)     PT Frequency: min 4x weekly OT Frequency: min 4x weekly            Contractures Contractures Info: Not present    Additional Factors Info  Code Status, Allergies Code Status Info: DNR Allergies Info: Morphine   Oxycodone   Carvedilol   Cefuroxime Axetil   Iodinated Contrast Media   Lisinopril           Current Medications (10/11/2021):  This is the current hospital active medication list Current Facility-Administered Medications  Medication Dose Route Frequency Provider Last Rate Last Admin   (feeding supplement) PROSource Plus  liquid 30 mL  30 mL Oral BID BM Kc, Ramesh, MD       ascorbic acid (VITAMIN C) tablet 500 mg  500 mg Oral BID Kc, Ramesh, MD       aspirin EC tablet 81 mg  81 mg Oral Daily Kc, Ramesh, MD   81 mg at 10/11/21 6811   calcitRIOL (ROCALTROL) capsule 0.25 mcg  0.25 mcg Oral Daily Kc, Ramesh, MD   0.25 mcg at 10/11/21  5726   Chlorhexidine Gluconate Cloth 2 % PADS 6 each  6 each Topical Q0600 Lang Snow, NP   6 each at 10/11/21 0442   docusate sodium (COLACE) capsule 100 mg  100 mg Oral BID PRN Lang Snow, NP       heparin injection 5,000 Units  5,000 Units Subcutaneous Q8H Lang Snow, NP   5,000 Units at 10/11/21 1259   levothyroxine (SYNTHROID) tablet 75 mcg  75 mcg Oral Q0600 Nelle Don, MD   75 mcg at 10/11/21 2035   liothyronine (CYTOMEL) tablet 5 mcg  5 mcg Oral Daily Edwin Dada, MD   5 mcg at 10/11/21 5974   midodrine (PROAMATINE) tablet 10 mg  10 mg Oral TID WC Flora Lipps, MD   10 mg at 10/11/21 1258   multivitamin with minerals tablet 1 tablet  1 tablet Oral Daily Flora Lipps, MD   1 tablet at 10/11/21 1638   mupirocin ointment (BACTROBAN) 2 %   Nasal BID Lang Snow, NP   Given at 10/11/21 4536   polyethylene glycol (MIRALAX / GLYCOLAX) packet 17 g  17 g Oral Daily PRN Lang Snow, NP       pravastatin (PRAVACHOL) tablet 20 mg  20 mg Oral QPM Kc, Ramesh, MD   20 mg at 10/10/21 1735   torsemide (DEMADEX) tablet 40 mg  40 mg Oral BID Edwin Dada, MD   40 mg at 10/11/21 4680   zinc sulfate capsule 220 mg  220 mg Oral Daily Antonieta Pert, MD         Discharge Medications: Please see discharge summary for a list of discharge medications.  Relevant Imaging Results:  Relevant Lab Results:   Additional Information SSN: [id number]  Alberteen Sam, LCSW

## 2021-10-11 NOTE — Progress Notes (Signed)
Occupational Therapy Treatment Patient Details Name: [name] MRN: [id number] DOB: 1931/05/06 Today's Date: 10/11/2021   History of present illness Pt is an 88 y.o male with significant PMH of  CKD stage IV, hypertension, proteinuria, hyperparathyroidism, HLD, aspiration pneumonia, EF 15-20%, ischemic cardiomyopathy, bilateral carotid stenosis, severe aortic stenosis, AAA, MGUS, paroxysmal SVT, CABG, PDA, status post bioprosthetic valve replacement in 2011 who presented to the ED with unresponsiveness. MD assessment includes: Myxedema coma, cadiogenic shock, acute on chronic CHF, AKI, thrombocytopenia, anemia, A-fib, acute respiratory failure, pressure injury of the buttock stage 2, and hyponatremia.   OT comments  Upon entering the room, pt supine in bed with no c/o pain but does report extreme fatigue and requesting to return to bed to rest. Pt needing mod A to stand from recliner chair and min A for stand pivot transfer to the L with RW . Mod A for sit >supine. Pt repositioned for comfort and asleep as therapist exits the room. All needs within reach. Pt continues to benefit from OT intervention. Bed alarm activated for safety.    Recommendations for follow up therapy are one component of a multi-disciplinary discharge planning process, led by the attending physician.  Recommendations may be updated based on patient status, additional functional criteria and insurance authorization.    Follow Up Recommendations  Skilled nursing-short term rehab (<3 hours/day)    Assistance Recommended at Discharge Frequent or constant Supervision/Assistance  Patient can return home with the following  A lot of help with walking and/or transfers;A lot of help with bathing/dressing/bathroom;Assistance with cooking/housework;Assistance with feeding   Equipment Recommendations  Other (comment) (defer to next venue of care)       Precautions / Restrictions Precautions Precautions:  Fall Restrictions Weight Bearing Restrictions: No       Mobility Bed Mobility Overal bed mobility: Needs Assistance Bed Mobility: Sit to Supine       Sit to supine: Mod assist   General bed mobility comments: assist with trunk support and B LEs    Transfers Overall transfer level: Needs assistance Equipment used: Rolling walker (2 wheels) Transfers: Sit to/from Stand, Bed to chair/wheelchair/BSC Sit to Stand: Mod assist     Step pivot transfers: Min assist     General transfer comment: Pt is very fatigued but puts forth good effort     Balance Overall balance assessment: Needs assistance   Sitting balance-Leahy Scale: Fair     Standing balance support: Bilateral upper extremity supported, During functional activity Standing balance-Leahy Scale: Fair                             ADL either performed or assessed with clinical judgement    Extremity/Trunk Assessment Upper Extremity Assessment Upper Extremity Assessment: Generalized weakness   Lower Extremity Assessment Lower Extremity Assessment: Generalized weakness        Vision Patient Visual Report: No change from baseline            Cognition Arousal/Alertness: Awake/alert Behavior During Therapy: Flat affect Overall Cognitive Status: Impaired/Different from baseline                                 General Comments: Pt able to follow commands with increased time secondary to slow processing                   Pertinent Vitals/ Pain  Pain Assessment Pain Assessment: No/denies pain         Frequency  Min 2X/week        Progress Toward Goals  OT Goals(current goals can now be found in the care plan section)  Progress towards OT goals: Progressing toward goals  Acute Rehab OT Goals Patient Stated Goal: to return home OT Goal Formulation: With patient Time For Goal Achievement: 10/23/21 Potential to Achieve Goals: [place] Discharge plan remains  appropriate;Frequency remains appropriate       AM-PAC OT "6 Clicks" Daily Activity     Outcome Measure   Help from another person eating meals?: None Help from another person taking care of personal grooming?: A Little Help from another person toileting, which includes using toliet, bedpan, or urinal?: A Lot Help from another person bathing (including washing, rinsing, drying)?: A Lot Help from another person to put on and taking off regular upper body clothing?: A Little Help from another person to put on and taking off regular lower body clothing?: A Lot 6 Click Score: 16    End of Session Equipment Utilized During Treatment: Rolling walker (2 wheels);Oxygen  OT Visit Diagnosis: Unsteadiness on feet (R26.81);Muscle weakness (generalized) (M62.81)   Activity Tolerance Patient tolerated treatment well;Patient limited by fatigue   Patient Left in bed;with call bell/phone within reach;with bed alarm set   Nurse Communication Mobility status        Time: 1430-1445 OT Time Calculation (min): 15 min  Charges: OT General Charges $OT Visit: 1 Visit OT Treatments $Therapeutic Activity: 8-22 mins  Darleen Crocker, MS, OTR/L , CBIS ascom [phone]  10/11/21, 3:01 PM

## 2021-10-12 LAB — BASIC METABOLIC PANEL
Anion gap: 10 (ref 5–15)
BUN: 118 mg/dL — ABNORMAL HIGH (ref 8–23)
CO2: 28 mmol/L (ref 22–32)
Calcium: 8.4 mg/dL — ABNORMAL LOW (ref 8.9–10.3)
Chloride: 102 mmol/L (ref 98–111)
Creatinine, Ser: 3.49 mg/dL — ABNORMAL HIGH (ref 0.61–1.24)
GFR, Estimated: 16 mL/min — ABNORMAL LOW (ref >60)
Glucose, Bld: 97 mg/dL (ref 70–99)
Potassium: 3.9 mmol/L (ref 3.5–5.1)
Sodium: 140 mmol/L (ref 135–145)

## 2021-10-12 MED ORDER — LEVOTHYROXINE SODIUM 75 MCG PO TABS: 75.0000 ug | ORAL_TABLET | Freq: Every day | ORAL | 0 refills | Status: AC

## 2021-10-12 MED ORDER — LIOTHYRONINE SODIUM 5 MCG PO TABS: 5.0000 ug | ORAL_TABLET | Freq: Every day | ORAL | 0 refills | Status: AC

## 2021-10-12 MED ORDER — ADULT MULTIVITAMIN W/MINERALS CH: 1.0000 | ORAL_TABLET | Freq: Every day | ORAL | Status: AC

## 2021-10-12 MED ORDER — MIDODRINE HCL 10 MG PO TABS: 10.0000 mg | ORAL_TABLET | Freq: Three times a day (TID) | ORAL | Status: AC

## 2021-10-12 MED ORDER — TORSEMIDE 40 MG PO TABS: 40.0000 mg | ORAL_TABLET | Freq: Two times a day (BID) | ORAL | Status: DC

## 2021-10-12 MED ORDER — ACETAMINOPHEN 325 MG PO TABS: 650.0000 mg | ORAL_TABLET | Freq: Four times a day (QID) | ORAL | Status: DC | PRN

## 2021-10-12 MED ORDER — PROSOURCE PLUS PO LIQD: 30.0000 mL | Freq: Two times a day (BID) | ORAL | Status: AC

## 2021-10-12 NOTE — Progress Notes (Signed)
77- RN entered patient room per request of patient's daughter. Patient's daughter was verbally aggressive towards this nurse. States that patient's gown was dirty with dried food and he had not been cleaned up. Patient was completely clean at 1800 when RN was visiting with patient. Daughter states that patient's buttocks was not cared for properly due to the sacral pad being off from when PT put patient in the chair around 1700. Patient's daughter states patient is in pain. RN tried to address daughter's concerns, but daughter continued to interrupt nurse. Patient appeared upset compared to earlier when RN and neighbor were with patient. RN tried to address patient, but was interrupted again by daughter. RN explained to patient's daughter that if she was going to continue to impede in the care of the patient and be verbally aggressive towards staff, that she was going to need to leave.  RN asked patient if he would like some pain medication ordered, but before patient could answer, patient's daughter started shouting at RN stating "he cannot have narcotics. You are his nurse. You should have known this." RN continued to explain that RN was going to ask for tylenol, not narcotics. Daughter stated "next time you need to say tylenol, otherwise he wont take it." RN apologized for the confusion regarding pain medication, but re educated patient's daughter that tylenol is considered a pain medication as well. RN continued to try to talk to patient, but was interrupted each time with aggressive behavior from daughter. RN left room and reported incident to oncoming charge RN, Ria Comment.

## 2021-10-12 NOTE — TOC Progression Note (Signed)
Transition of Care [facility]) - Progression Note    Patient Details  Name: [name] MRN: [id number] Date of Birth: May 02, 1931  Transition of [facility]) CM/SW [place], [place] Phone Number: 10/12/2021, 10:47 AM  Clinical Narrative:     [facility] has started insurance auth for patient to go to [employer] and rehab.   Pending insurance auth at this time.         Expected Discharge Plan and Services                                                 Social Determinants of Health (SDOH) Interventions    Readmission Risk Interventions Readmission Risk Prevention Plan 05/31/2021  Transportation Screening Complete  PCP or Specialist Appt within 3-5 Days Complete  Social Work Consult for [place] Planning/Counseling Complete  Palliative Care Screening Not Applicable  Medication Review [employer]) Complete  Some recent data might be hidden

## 2021-10-12 NOTE — Progress Notes (Signed)
1730- Patient in chair with neighbor visiting at this time. Patient making jokes with visitor and nurse. RN performed bed change at this time. 1800- RN checked on patient in chair. Patient slid down in the chair. RN helped patient slide up and put a pillow under left side to alleviate pressure. Patient opened up to RN regarding his frustration with his daughter. States that she is "doing too much." Patient expressed need to maintain his independence as much as possible even in regards to ordering meals. RN encouraged patient that family sometimes tries to be too helpful in these situations because they don't know what else to do to show that they love and care for the patient. Patient agreed. RN sat with patient for about 15 minutes to give him time to talk about what was on his mind.

## 2021-10-12 NOTE — Progress Notes (Signed)
[facility]  ROUNDING NOTE   Subjective:   Patient seen sitting up in bed, eating breakfast Appears withdrawn Tolerating meals, denies nausea and vomiting Denies shortness of breath, remains on supplemental oxygen  Objective:  Vital signs in last 24 hours:  Temp:  [97.4 F (36.3 C)-98.5 F (36.9 C)] 98.2 F (36.8 C) (01/26 1118) Pulse Rate:  [60-72] 72 (01/26 1118) Resp:  [17-20] 20 (01/26 1118) BP: (90-103)/(60-68) 90/64 (01/26 1118) SpO2:  [99 %-100 %] 99 % (01/26 1118) Weight:  [67.3 kg] 67.3 kg (01/26 0438)  Weight change: -0.1 kg Filed Weights   10/09/21 0500 10/11/21 0500 10/12/21 0438  Weight: 71.9 kg 67.4 kg 67.3 kg    Intake/Output: I/O last 3 completed shifts: In: 17 [P.O.:960] Out: 3350 [Urine:3350]   Intake/Output this shift:  Total I/O In: 480 [P.O.:480] Out: 800 [Urine:800]  Physical Exam: General: In no acute distress  Head: Normocephalic, atraumatic. Moist oral mucosal membranes  Eyes: Anicteric  Lungs:  Respirations even, Clear to auscultation, [place] O2  Heart: Regular rate and rhythm  Abdomen:  Soft, nontender, bowel sounds present  Extremities: No peripheral edema  Neurologic: Awake, alert, following commands,fogetful  Skin: Bilateral arms with ecchymotic areas   Access: NONE    Basic Metabolic Panel: Recent Labs  Lab 10/08/21 0603 10/09/21 0501 10/10/21 0725 10/11/21 0847 10/12/21 0530  NA 140 138 138 137 140  K 3.5 4.0 3.6 3.9 3.9  CL 103 104 101 102 102  CO2 23 24 25 24 28   GLUCOSE 86 93 92 89 97  BUN 117* 108* 119* 115* 118*  CREATININE 3.89* 3.83* 3.63* 3.54* 3.49*  CALCIUM 8.0* 8.0* 8.1* 8.2* 8.4*  PHOS  --  5.1*  --   --   --      Liver Function Tests: Recent Labs  Lab 10/07/21 0654 10/09/21 0501  AST 35  --   ALT 27  --   ALKPHOS 61  --   BILITOT 1.0  --   PROT 6.5  --   ALBUMIN 3.1* 3.0*    No results for input(s): LIPASE, AMYLASE in the last 168 hours. No results for input(s): AMMONIA in the  last 168 hours.  CBC: Recent Labs  Lab 10/06/21 0659 10/07/21 0654 10/09/21 0501 10/10/21 0725  WBC 7.7 7.9 6.6 6.0  NEUTROABS  --   --   --  4.1  HGB 9.0* 9.2* 8.9* 9.0*  HCT 26.0* 27.3* 26.5* 27.2*  MCV 100.4* 102.6* 102.7* 104.2*  PLT 110* 109* 120* 114*     Cardiac Enzymes: No results for input(s): CKTOTAL, CKMB, CKMBINDEX, TROPONINI in the last 168 hours.  BNP: Invalid input(s): POCBNP  CBG: Recent Labs  Lab 10/05/21 2000 10/06/21 0804  GLUCAP 135* 108*     Microbiology: Results for orders placed or performed during the hospital encounter of 09/28/21  Resp Panel by RT-PCR (Flu A&B, Covid) Nasopharyngeal Swab     Status: None   Collection Time: 09/28/21  8:19 PM   Specimen: Nasopharyngeal Swab; Nasopharyngeal(NP) swabs in vial transport medium  Result Value Ref Range Status   SARS Coronavirus 2 by RT PCR NEGATIVE NEGATIVE Final    Comment: (NOTE) SARS-CoV-2 target nucleic acids are NOT DETECTED.  The SARS-CoV-2 RNA is generally detectable in upper respiratory specimens during the acute phase of infection. The lowest concentration of SARS-CoV-2 viral copies this assay can detect is 138 copies/mL. A negative result does not preclude SARS-Cov-2 infection and should not be used as the sole basis  for treatment or other patient management decisions. A negative result may occur with  improper specimen collection/handling, submission of specimen other than nasopharyngeal swab, presence of viral mutation(s) within the areas targeted by this assay, and inadequate number of viral copies(<138 copies/mL). A negative result must be combined with clinical observations, patient history, and epidemiological information. The expected result is Negative.  Fact Sheet for Patients:  EntrepreneurPulse.com.au  Fact Sheet for Healthcare Providers:  IncredibleEmployment.be  This test is no t yet approved or cleared by the [place] FDA  and  has been authorized for detection and/or diagnosis of SARS-CoV-2 by FDA under an Emergency Use Authorization (EUA). This EUA will remain  in effect (meaning this test can be used) for the duration of the COVID-19 declaration under Section 564(b)(1) of the Act, 21 U.S.C.section 360bbb-3(b)(1), unless the authorization is terminated  or revoked sooner.       Influenza A by PCR NEGATIVE NEGATIVE Final   Influenza B by PCR NEGATIVE NEGATIVE Final    Comment: (NOTE) The Xpert Xpress SARS-CoV-2/FLU/RSV plus assay is intended as an aid in the diagnosis of influenza from Nasopharyngeal swab specimens and should not be used as a sole basis for treatment. Nasal washings and aspirates are unacceptable for Xpert Xpress SARS-CoV-2/FLU/RSV testing.  Fact Sheet for Patients: EntrepreneurPulse.com.au  Fact Sheet for Healthcare Providers: IncredibleEmployment.be  This test is not yet approved or cleared by the [place] FDA and has been authorized for detection and/or diagnosis of SARS-CoV-2 by FDA under an Emergency Use Authorization (EUA). This EUA will remain in effect (meaning this test can be used) for the duration of the COVID-19 declaration under Section 564(b)(1) of the Act, 21 U.S.C. section 360bbb-3(b)(1), unless the authorization is terminated or revoked.  Performed at [facility], [address] [place], [place] [address]   Culture, blood (routine x 2)     Status: None   Collection Time: 09/28/21  9:47 PM   Specimen: BLOOD  Result Value Ref Range Status   Specimen Description BLOOD LEFT FOREARM  Final   Special Requests   Final    BOTTLES DRAWN AEROBIC AND ANAEROBIC Blood Culture adequate volume   Culture   Final    NO GROWTH 5 DAYS Performed at [facility], [address] [place], [place] [address]    Report Status 10/03/2021 FINAL  Final  Culture, blood (routine x 2)     Status: None   Collection Time:  09/28/21  9:47 PM   Specimen: BLOOD  Result Value Ref Range Status   Specimen Description BLOOD LEFT FOREARM  Final   Special Requests IN PEDIATRIC BOTTLE Blood Culture adequate volume  Final   Culture   Final    NO GROWTH 5 DAYS Performed at [facility], [address], [place], [place] [address]    Report Status 10/03/2021 FINAL  Final  MRSA Next Gen by PCR, Nasal     Status: Abnormal   Collection Time: 09/29/21  2:38 AM   Specimen: Nasal Mucosa; Nasal Swab  Result Value Ref Range Status   MRSA by PCR Next Gen DETECTED (A) NOT DETECTED Final    Comment: RESULT CALLED TO, READ BACK BY AND VERIFIED WITH: Hansel Starling @0353  on 09/29/21 SKL (NOTE) The GeneXpert MRSA Assay (FDA approved for NASAL specimens only), is one component of a comprehensive MRSA colonization surveillance program. It is not intended to diagnose MRSA infection nor to guide or monitor treatment for MRSA infections. Test performance is not FDA  approved in patients less than 53 years old. Performed at [facility], [address] [place], [place] [address]     Coagulation Studies: No results for input(s): LABPROT, INR in the last 72 hours.   Urinalysis: No results for input(s): COLORURINE, LABSPEC, PHURINE, GLUCOSEU, HGBUR, BILIRUBINUR, KETONESUR, PROTEINUR, UROBILINOGEN, NITRITE, LEUKOCYTESUR in the last 72 hours.  Invalid input(s): APPERANCEUR     Imaging: No results found.   Medications:      (feeding supplement) PROSource Plus  30 mL Oral BID BM   vitamin C  500 mg Oral BID   aspirin EC  81 mg Oral Daily   calcitRIOL  0.25 mcg Oral Daily   Chlorhexidine Gluconate Cloth  6 each Topical Q0600   heparin  5,000 Units Subcutaneous Q8H   levothyroxine  75 mcg Oral Q0600   liothyronine  5 mcg Oral Daily   midodrine  10 mg Oral TID WC   multivitamin with minerals  1 tablet Oral Daily   mupirocin ointment   Nasal BID   pravastatin  20 mg Oral QPM   torsemide  40 mg Oral BID    zinc sulfate  220 mg Oral Daily   docusate sodium, polyethylene glycol  Assessment/ Plan:   [name] is a 88 y.o. white male with hypertension, coronary artery disease, aortic valve replacement, osteoarthritis, diverticulosis, ruptured abdominal viscus, history of hyponatremia, history of hiatal hernia, abdominal aortic aneurysm, constipation, right hip fracture status post repair, chronic systolic heart failure who presents to 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 Started on IV levothyroxine and now on levothyroxine 25mg  daily. Also on stress dose steroids.   1. Acute kidney injury on Chronic kidney disease stage V: baseline creatinine of 3.9 on 09/04/21. Patient with chronic kidney disease secondary to hypertension and vascular disease. Acute kidney injury secondary to acute exacerbation of chronic systolic congestive heart failure: Family was interested in peritoneal dialysis prior to admission. Needed cardiology clearance before proceeding with PD catheter placement, possibly not a dialysis candidate, due to weakened cardiac state.Palliative care to continue goals fo care discussion with family and patient Lab Results  Component Value Date   CREATININE 3.49 (H) 10/12/2021   CREATININE 3.54 (H) 10/11/2021   CREATININE 3.63 (H) 10/10/2021  Continue Torsemide 40 mg PO  BID  Creatinine appears stable with adequate urine output recorded   2. Hypotension: with acute exacerbation of systolic congestive heart failure: echo from 01/31/21 with Ejection Fraction of <20%. Cardiogenic shock. Holding home regimen of losartan and PO torsemide.   - ECHO on 10/03/21 shows EF <20% with global hypokinesis - Continue diuresis with oral torsemide BP  90/64, continue midodrine 10 mg TID  3. Anemia with chronic kidney disease Lab Results  Component Value Date   HGB 9.0 (L) 10/10/2021  We will continue to monitor  4. Hyponatremia: secondary to chronic kidney disease and congestive heart failure.   Sodium remains 140  5. Secondary Hyperparathyroidism: PTH 110 on 08/31/21. Lab Results  Component Value Date   CALCIUM 8.4 (L) 10/12/2021   CAION 1.12 02/23/2010   PHOS 5.1 (H) 10/09/2021    We will continue to monitor bone minerals during this admission   LOS: [place] 1/26/20234:25 PM

## 2021-10-12 NOTE — Progress Notes (Signed)
Spoke to patient's daughter, [name], on the phone this evening. She is not agreeable to working her father up for [facility]. She also wants the doctors to be aware that she is concerned about patient's vision. RN encouraged daughter to speak with her brother and come to an agreement regarding plan for patient.

## 2021-10-12 NOTE — Progress Notes (Signed)
Physical Therapy Treatment Patient Details Name: [name] MRN: [id number] DOB: [birth year] Today's Date: 10/12/2021   History of Present Illness Pt is an 88 y.o male with significant PMH of  CKD stage IV, hypertension, proteinuria, hyperparathyroidism, HLD, aspiration pneumonia, EF 15-20%, ischemic cardiomyopathy, bilateral carotid stenosis, severe aortic stenosis, AAA, MGUS, paroxysmal SVT, CABG, PDA, status post bioprosthetic valve replacement in 2011 who presented to the ED with unresponsiveness. MD assessment includes: Myxedema coma, cadiogenic shock, acute on chronic CHF, AKI, thrombocytopenia, anemia, A-fib, acute respiratory failure, pressure injury of the buttock stage 2, and hyponatremia.    PT Comments    Pt was long sitting up in bed upon arriving. Supportive neighbor was in room throughout session. He was agreeable to OOB activity with encouragement. When asked about his goals he states," I want to get stronger." He was somewhat lethargic throughout session and is extremely HOH. Author assisted applying hearing aides with improvement however remained very HOH. He was able to exit L side of bed, stand to RW and ambulate to recliner (~27ft total). Pt was fatigued from minimal activity and even falls asleep quickly afterwards. Pt will need extensive PT going forward to maximize independence with ADLs.  Author did encourage discussion about his personal goals going forward and willingness to go back to a SNF. He is agreeable but wants a different rehab than he has previously been too.     Recommendations for follow up therapy are one component of a multi-disciplinary discharge planning process, led by the attending physician.  Recommendations may be updated based on patient status, additional functional criteria and insurance authorization.  Follow Up Recommendations  Skilled nursing-short term rehab (<3 hours/day)     Assistance Recommended at Discharge Frequent or constant  Supervision/Assistance     Equipment Recommendations  Other (comment) (defer to next level of care)       Precautions / Restrictions Precautions Precautions: Fall     Mobility  Bed Mobility Overal bed mobility: Needs Assistance Bed Mobility: Supine to Sit Supine to sit: Min assist, Mod assist General bed mobility comments: Increased time + min-mod assist    Transfers Overall transfer level: Needs assistance Equipment used: Rolling walker (2 wheels) Transfers: Sit to/from Stand Sit to Stand: Min assist, From elevated surface     General transfer comment: pt stood EOB to RW with min assist + minimally elevated bed height    Ambulation/Gait Ambulation/Gait assistance: Min guard Gait Distance (Feet): 15 Feet Assistive device: Rolling walker (2 wheels) Gait Pattern/deviations: Step-to pattern, Shuffle, Trunk flexed Gait velocity: decreased     General Gait Details: Pt was able to ambulate from L side of bed around room to recliner with CGA. he does endorse fatigyue and drifts off to sleep quickly after short gait distance. HR and sao2 were stable throughout.     Balance Overall balance assessment: Needs assistance Sitting-balance support: Feet supported Sitting balance-Leahy Scale: Fair     Standing balance support: Bilateral upper extremity supported, During functional activity Standing balance-Leahy Scale: Fair Standing balance comment: is reliant on RW for dynamic balance       Cognition Arousal/Alertness: Awake/alert, Lethargic Behavior During Therapy: Flat affect Overall Cognitive Status:  (pt's supportive neighbor present)      General Comments: Pt is alert however over disoriented to full scope of situation. He does state he wants to get stronger  and knows he would be in rehab for more than two weeks  Pertinent Vitals/Pain Pain Assessment Pain Assessment: No/denies pain     PT Goals (current goals can now be found in the care plan  section) Acute Rehab PT Goals Patient Stated Goal: get stronger Progress towards PT goals: Progressing toward goals    Frequency    Min 2X/week      PT Plan Current plan remains appropriate       AM-PAC PT "6 Clicks" Mobility   Outcome Measure  Help needed turning from your back to your side while in a flat bed without using bedrails?: A Little Help needed moving from lying on your back to sitting on the side of a flat bed without using bedrails?: A Little Help needed moving to and from a bed to a chair (including a wheelchair)?: A Little Help needed standing up from a chair using your arms (e.g., wheelchair or bedside chair)?: A Little Help needed to walk in hospital room?: Total Help needed climbing 3-5 steps with a railing? : Total 6 Click Score: 14    End of Session Equipment Utilized During Treatment: Gait belt Activity Tolerance: Patient tolerated treatment well Patient left: in chair;with chair alarm set;with family/visitor present;with call bell/phone within reach Nurse Communication: Mobility status PT Visit Diagnosis: Unsteadiness on feet (R26.81);Difficulty in walking, not elsewhere classified (R26.2);Muscle weakness (generalized) (M62.81)     Time: 0100-7121 PT Time Calculation (min) (ACUTE ONLY): 14 min  Charges:  $Gait Training: 8-22 mins                     Julaine Fusi PTA 10/12/21, 4:58 PM

## 2021-10-12 NOTE — Progress Notes (Signed)
PROGRESS NOTE    [name]  IHK:[id number] DOB: 22-Oct-1930 DOA: 09/28/2021 PCP: James Hire, MD   Chief Complaint  Patient presents with   Bradycardia   unresponsive  Brief Narrative/Hospital Course: [name], 88 y.o. male with PMH of  with CKD V, baseline 3.9, sCHF EF <20% due to ischemic CM, AS and CAD s/p CABG and AVR 2011, pAF not on [facility],  and PVD who presented to the ED with unresponsiveness.  Patient had gone to Pulmonary clinic for evaluation of dyspnea, fatigue, was told to go to the ER  Reportedly, family took patient to eat and then home, where he became unresponsive, EMS activated.  EMS found in a chair at home with HR 20s and BP was 80/60, hypothermic, started pacing, started Levophed and transported.  In the ER he was lethargic, somnolent and getting externally paced.  ECG showed junctional bradycardia. Hypothermic, hypotensive, CXR with bilateral effusions and edema.  Cr 4.69 BNP: 2137, TSH: 213.  He was started on levothyroxine, hydrocortisone and Levophed and admitted to ICU, Cardiology and Nephrology consulted .  At this time patient overall stable and improving tolerating diuretics, PT OT has recommended skilled nursing facility, seen by palliative care. He does have overall guarded prognosis in the setting of complex cardiac conditions, renal dysfunction. At this time plan is for him to go to skilled nursing facility awaiting placement   Subjective: Seen and examined this morning is alert awake having his meal.  Has no complaints. Overnight afebrile blood pressure stable.  Was in 54 systolic yesterday.   Labs with creatinine stable 3.4   Assessment & Plan:  Myxedema coma:Presented with unresponsiveness severe bradycardia requiring external pacing, hypothermia.  TSH was 213 Free T4 less than 0.2 S/P IV Synthroid, IV hydrocortisone repeat TSH/free T4 has improved to 3 still low.  We will continue with current levothyroxine and liothyronine. Repeat  TST/FT4/FT3 in 2-3 wks  Acute on chronic systolic CHF Cardiogenic shock: Dilated cardiomyopathy: Volume status overall stable. BP soft remains on midodrine.  Continue on torsemide 40 BID as per nephrology,holding losartan due to blood pressure being soft/renal dysfunction.  Cardiology has signed off.  Monitor intake output Daily weight while here . Weight on admit 171 lb> 158> 148.5> 148.3 lb Net IO Since Admission: -12,121.12 mL [10/12/21 1102]  Filed Weights   10/09/21 0500 10/11/21 0500 10/12/21 0438  Weight: 71.9 kg 67.4 kg 67.3 kg    MR w/ dilated cardiomyopathy: Not amenable to MitraClip due to DCM.  Follow-up with cardiology for further recommendation  CAD with CABG x5 in 2011 HLD AAA 3.2 cm in 2020: Seen by cardiology this admission,Echo showed EF less than 20%.  Cardiology encouraged goals of care discussion given his overall guarded prognosis.Continue aspirin and statin.  AKI on CKD stage IV: Baseline creatinine around 3.9 followed by nephrology continue diuretics with torsemide per nephrology monitor intake output, labs this morning shows creatinine at 3.4 BUN 116.Does not appear to be ideal candidate for dialysis will defer to nephrology to address. Recent Labs  Lab 10/08/21 0603 10/09/21 0501 10/10/21 0725 10/11/21 0847 10/12/21 0530  BUN 117* 108* 119* 115* 118*  CREATININE 3.89* 3.83* 3.63* 3.54* 3.49*    Benign essential HTN: BP is soft, continue midodrine 10 mg.  Hyponatremia:Stable  Acute respiratory failure with hypoxia: Due to patient's CHF.  Continue supplemental oxygen to maintain saturation at least 93% or above  PAF: Rate controlled, not on anticoagulation at this time due to patient's preference amiodarone  on hold  Demand ischemia: In the setting of CHF. Anemia in other chronic diseases -hemoglobin stable in the setting of CKD and hypothyroidism.  Monitor   Recent Labs  Lab 10/06/21 0659 10/07/21 0654 10/09/21 0501 10/10/21 0725  HGB 9.0* 9.2*  8.9* 9.0*  HCT 26.0* 27.3* 26.5* 27.2*    Thrombocytopenia: Due to acute illness.  Stable Recent Labs  Lab 10/06/21 0659 10/07/21 0654 10/09/21 0501 10/10/21 0725  PLT 110* 109* 120* 114*   Goals of care Debility Deconditioning: Overall prognosis does not appear bright, prognosis is guarded given complex comorbidities severe systolic dysfunction with CKD.  Palliative care following, MOST form completed 1/24, currently is DNR.  Patient may benefit with transition to hospice level care when patient family is ready, at this time pursuing SNF.  Pressure injury  POA see below Pressure Injury 09/29/21 Buttocks Right;Mid Stage 2 -  Partial thickness loss of dermis presenting as a shallow open injury with a red, pink wound bed without slough. (Active)  09/29/21 0230  Location: Buttocks  Location Orientation: Right;Mid  Staging: Stage 2 -  Partial thickness loss of dermis presenting as a shallow open injury with a red, pink wound bed without slough.  Wound Description (Comments):   Present on Admission: Yes     Pressure Injury 09/29/21 Buttocks Left;Mid Stage 2 -  Partial thickness loss of dermis presenting as a shallow open injury with a red, pink wound bed without slough. (Active)  09/29/21 0230  Location: Buttocks  Location Orientation: Left;Mid  Staging: Stage 2 -  Partial thickness loss of dermis presenting as a shallow open injury with a red, pink wound bed without slough.  Wound Description (Comments):   Present on Admission: Yes  DVT prophylaxis: heparin injection 5,000 Units Start: 09/29/21 0000 Code Status:   Code Status: DNR Family Communication: plan of care discussed with patient at bedside. Updated daughter on the phone 1/24.  Status EU:MPNTIRWER. Remains inpatient appropriate because: for ongoing deconditioning, Disposition:Currently medically stable for discharge. Anticipated Disposition:SNF for short term rehab  Total time spent in the care of this patient 35  MINUTES Objective: Vitals last 24 hrs: Vitals:   10/11/21 1927 10/12/21 0007 10/12/21 0438 10/12/21 0746  BP: 94/60 94/61 103/68 103/67  Pulse:  63 65 68  Resp:  20 19 17   Temp: 97.8 F (36.6 C) 97.6 F (36.4 C) 97.8 F (36.6 C) 98.5 F (36.9 C)  TempSrc: Oral Oral Oral   SpO2: 100% 100% 100% 100%  Weight:   67.3 kg   Height:       Weight change: -0.1 kg  Intake/Output Summary (Last 24 hours) at 10/12/2021 1102 Last data filed at 10/12/2021 0950 Gross per 24 hour  Intake 720 ml  Output 1350 ml  Net -630 ml  Net IO Since Admission: -12,121.12 mL [10/12/21 1102]   Physical Examination: General exam: AAO, elderly, frail older than stated age, weak appearing. HEENT:Oral mucosa moist, Ear/Nose WNL grossly, dentition normal. Respiratory system: bilaterally clear, no use of accessory muscle Cardiovascular system: S1 & S2 +, No JVD,. Gastrointestinal system: Abdomen soft, NT,ND, BS+ Nervous System:Alert, awake, moving extremities and grossly nonfocal Extremities: no edema, distal peripheral pulses palpable.  Skin: No rashes,no icterus. MSK: Normal muscle bulk,tone, power   Medications reviewed:  Scheduled Meds:  (feeding supplement) PROSource Plus  30 mL Oral BID BM   vitamin C  500 mg Oral BID   aspirin EC  81 mg Oral Daily   calcitRIOL  0.25 mcg Oral Daily  Chlorhexidine Gluconate Cloth  6 each Topical Q0600   heparin  5,000 Units Subcutaneous Q8H   levothyroxine  75 mcg Oral Q0600   liothyronine  5 mcg Oral Daily   midodrine  10 mg Oral TID WC   multivitamin with minerals  1 tablet Oral Daily   mupirocin ointment   Nasal BID   pravastatin  20 mg Oral QPM   torsemide  40 mg Oral BID   zinc sulfate  220 mg Oral Daily   Continuous Infusions:  Diet Order             Diet 2 gram sodium Room service appropriate? Yes; Fluid consistency: Thin  Diet effective now                   Nutrition Problem: Increased nutrient needs Etiology: chronic illness  (CHF) Signs/Symptoms: estimated needs Interventions: Ensure Enlive (each supplement provides 350kcal and 20 grams of protein), MVI, Liberalize Diet  Weight change: -0.1 kg  Wt Readings from Last 3 Encounters:  10/12/21 67.3 kg  08/21/21 69.4 kg  08/03/21 64.9 kg     Consultants:see note  Procedures:see note Antimicrobials: Anti-infectives (From admission, onward)    Start     Dose/Rate Route Frequency Ordered Stop   09/29/21 0738  vancomycin variable dose per unstable renal function (pharmacist dosing)  Status:  Discontinued         Does not apply See admin instructions 09/29/21 0738 09/29/21 1004   09/29/21 0600  vancomycin (VANCOREADY) IVPB 1750 mg/350 mL        1,750 mg 175 mL/hr over 120 Minutes Intravenous  Once 09/29/21 0455 09/29/21 0822   09/29/21 0545  ceFAZolin (ANCEF) IVPB 1 g/50 mL premix  Status:  Discontinued        1 g 100 mL/hr over 30 Minutes Intravenous Every 12 hours 09/29/21 0454 09/29/21 1004      Culture/Microbiology    Component Value Date/Time   SDES BLOOD LEFT FOREARM 09/28/2021 2147   SDES BLOOD LEFT FOREARM 09/28/2021 2147   SPECREQUEST  09/28/2021 2147    BOTTLES DRAWN AEROBIC AND ANAEROBIC Blood Culture adequate volume   SPECREQUEST IN PEDIATRIC BOTTLE Blood Culture adequate volume 09/28/2021 2147   CULT  09/28/2021 2147    NO GROWTH 5 DAYS Performed at [facility], [address] [place], [place] [address]    CULT  09/28/2021 2147    NO GROWTH 5 DAYS Performed at [facility], [address] [place], [place] [address]    REPTSTATUS 10/03/2021 FINAL 09/28/2021 2147   REPTSTATUS 10/03/2021 FINAL 09/28/2021 2147    Other culture-see note  Unresulted Labs (From admission, onward)     Start     Ordered   10/12/21 1157  Basic metabolic panel  Daily,   R     Question:  Specimen collection method  Answer:  Lab=Lab collect   10/11/21 0746          Data Reviewed: I have personally reviewed following labs and  imaging studies CBC: Recent Labs  Lab 10/06/21 0659 10/07/21 0654 10/09/21 0501 10/10/21 0725  WBC 7.7 7.9 6.6 6.0  NEUTROABS  --   --   --  4.1  HGB 9.0* 9.2* 8.9* 9.0*  HCT 26.0* 27.3* 26.5* 27.2*  MCV 100.4* 102.6* 102.7* 104.2*  PLT 110* 109* 120* 262*   Basic Metabolic Panel: Recent Labs  Lab 10/08/21 0603 10/09/21 0501 10/10/21 0725 10/11/21 0847 10/12/21 0530  NA 140 138 138 137 140  K 3.5 4.0 3.6 3.9 3.9  CL 103 104 101 102 102  CO2 23 24 25 24 28   GLUCOSE 86 93 92 89 97  BUN 117* 108* 119* 115* 118*  CREATININE 3.89* 3.83* 3.63* 3.54* 3.49*  CALCIUM 8.0* 8.0* 8.1* 8.2* 8.4*  PHOS  --  5.1*  --   --   --    GFR: Estimated Creatinine Clearance: 13.9 mL/min (A) (by C-G formula based on SCr of 3.49 mg/dL (H)). Liver Function Tests: Recent Labs  Lab 10/07/21 0654 10/09/21 0501  AST 35  --   ALT 27  --   ALKPHOS 61  --   BILITOT 1.0  --   PROT 6.5  --   ALBUMIN 3.1* 3.0*   No results for input(s): LIPASE, AMYLASE in the last 168 hours. No results for input(s): AMMONIA in the last 168 hours. Coagulation Profile: No results for input(s): INR, PROTIME in the last 168 hours. Cardiac Enzymes: No results for input(s): CKTOTAL, CKMB, CKMBINDEX, TROPONINI in the last 168 hours. BNP (last 3 results) No results for input(s): PROBNP in the last 8760 hours. HbA1C: No results for input(s): HGBA1C in the last 72 hours. CBG: Recent Labs  Lab 10/05/21 2000 10/06/21 0804  GLUCAP 135* 108*   Lipid Profile: No results for input(s): CHOL, HDL, LDLCALC, TRIG, CHOLHDL, LDLDIRECT in the last 72 hours. Thyroid Function Tests: No results for input(s): TSH, T4TOTAL, FREET4, T3FREE, THYROIDAB in the last 72 hours. Anemia Panel: No results for input(s): VITAMINB12, FOLATE, FERRITIN, TIBC, IRON, RETICCTPCT in the last 72 hours. Sepsis Labs: No results for input(s): PROCALCITON, LATICACIDVEN in the last 168 hours.  No results found for this or any previous visit (from  the past 240 hour(s)).   Radiology Studies: No results found.   LOS: 14 days   Antonieta Pert, MD [facility]  10/12/2021, 11:02 AM

## 2021-10-13 LAB — BASIC METABOLIC PANEL
Anion gap: 11 (ref 5–15)
BUN: 122 mg/dL — ABNORMAL HIGH (ref 8–23)
CO2: 27 mmol/L (ref 22–32)
Calcium: 8.5 mg/dL — ABNORMAL LOW (ref 8.9–10.3)
Chloride: 99 mmol/L (ref 98–111)
Creatinine, Ser: 3.54 mg/dL — ABNORMAL HIGH (ref 0.61–1.24)
GFR, Estimated: 16 mL/min — ABNORMAL LOW (ref >60)
Glucose, Bld: 111 mg/dL — ABNORMAL HIGH (ref 70–99)
Potassium: 4 mmol/L (ref 3.5–5.1)
Sodium: 137 mmol/L (ref 135–145)

## 2021-10-13 NOTE — TOC Progression Note (Addendum)
Transition of Care [facility]) - Progression Note    Patient Details  Name: [name] MRN: [id number] Date of Birth: [birth year]  Transition of Care [facility]) CM/SW [place], [place] Phone Number: 10/13/2021, 9:39 AM  Clinical Narrative:     Update 11:40 am: Butch Penny reports they have decided to move forward with [facility]. CSW has asked [facility] to review for acceptance, CSW called [employer] for insurance auth they report patient is under med review.     CSW spoke with patient's daughter Butch Penny, reports that she did not realize [facility] previously called [facility], which they would not like now as facility choice.   CSW provided other offers from [facility] or [facility], she reports she will speak with Pam and call me back to let me know of new bed choice.          Expected Discharge Plan and Services                                                 Social Determinants of Health (SDOH) Interventions    Readmission Risk Interventions Readmission Risk Prevention Plan 05/31/2021  Transportation Screening Complete  PCP or Specialist Appt within 3-5 Days Complete  Social Work Consult for [place] Planning/Counseling Complete  Palliative Care Screening Not Applicable  Medication Review [employer] photographer) Complete  Some recent data might be hidden

## 2021-10-13 NOTE — TOC Progression Note (Addendum)
Transition of Care [facility]) - Progression Note    Patient Details  Name: [name] MRN: [id number] Date of Birth: 07/07/1931  Transition of Care [facility]) CM/SW [place], [place] Phone Number: 10/13/2021, 3:55 PM  Clinical Narrative:     Plan for discharge to [facility] Monday, we have [employer] insurance auth.          Expected Discharge Plan and Services           Expected Discharge Date: 10/13/21                                     Social Determinants of Health (SDOH) Interventions    Readmission Risk Interventions Readmission Risk Prevention Plan 05/31/2021  Transportation Screening Complete  PCP or Specialist Appt within 3-5 Days Complete  Social Work Consult for [place] Planning/Counseling Complete  Palliative Care Screening Not Applicable  Medication Review Press photographer) Complete  Some recent data might be hidden

## 2021-10-13 NOTE — Discharge Summary (Addendum)
Physician Discharge Summary  [name]:[id number] DOB: 05-06-1931 DOA: 09/28/2021  PCP: Baxter Hire, MD  Admit date: 09/28/2021 Discharge date: 10/16/2021  Admitted From: home alone  Disposition:   SNF  Recommendations for Outpatient Follow-up:  Follow up with PCP, cardiology, nephrology in 1-2 weeks Please obtain BMP/CBC in one week  Home Health:[name]  Equipment/Devices: NONE  Discharge Condition: Stable Code Status:   Code Status: DNR Diet recommendation:  Diet Order             Diet 2 gram sodium Room service appropriate? Yes; Fluid consistency: Thin  Diet effective now                    Brief/Interim Summary:  88 y.o. male with PMH of  with CKD V, baseline 3.9, sCHF EF <20% due to ischemic CM, AS and CAD s/p CABG and AVR 2011, pAF not on AC,  and PVD who presented to the ED with unresponsiveness.  Patient had gone to Pulmonary clinic for evaluation of dyspnea, fatigue, was told to go to the ER  Reportedly, family took patient to eat and then home, where he became unresponsive, EMS activated.  EMS found in a chair at home with HR 20s and BP was 80/60, hypothermic, started pacing, started Levophed and transported. In the ER he was lethargic, somnolent and getting externally paced.  ECG showed junctional bradycardia. Hypothermic, hypotensive, CXR with bilateral effusions and edema.  Cr 4.69 BNP: 2137, TSH: 213.  He was started on levothyroxine, hydrocortisone and Levophed and admitted to ICU, Cardiology and Nephrology consulted .  At this time patient overall stable and improving strength but deconditioned frail.  He is tolerating diuretics, PT OT has recommended skilled nursing facility, seen by palliative care.He does have overall guarded prognosis in the setting of complex cardiac conditions, renal dysfunction. At this time plan is for him to go to skilled nursing facility based upon patient's preferences, and if medically stable for discharge once bed available    Discharge Diagnoses:   Myxedema coma:Presented with unresponsiveness severe bradycardia requiring external pacing, hypothermia.  TSH was 213 Free T4 less than 0.2 S/P IV Synthroid, IV hydrocortisone repeat TSH/free T4 has improved to 3 still low.  We will continue with current levothyroxine and liothyronine. Repeat TST/FT4/FT3 in 2-3 wks   Acute on chronic systolic CHF Cardiogenic shock: Dilated cardiomyopathy: Volume status overall stable. BP soft remains on midodrine.  Continue on torsemide 40 daily as per nephrology,holding losartan due to blood pressure being soft/renal dysfunction.  Cardiology has signed off.  Monitor intake output Daily weight while here . Weight on admit 171 lb> 158> 148.lb.  Net negative balance as below Net IO Since Admission: -13,851.12 mL [10/16/21 0828]   MR w/ dilated cardiomyopathy: Not amenable to MitraClip due to DCM.  Follow-up with cardiology for further recommendation CAD with CABG x5 in 2011 HLD AAA 3.2 cm in 2020: Seen by cardiology this admission,Echo showed EF less than 20%.  Cardiology encouraged goals of care discussion given his overall guarded prognosis.Continue aspirin and statin.  Can follow-up with cardiology as outpatient   AKI on CKD stage IV: Baseline creatinine around 3.9 followed by nephrology continue diuretics with torsemide per nephrology monitor intake output, labs this morning shows creatinine at 3.4 BUN 116.Does not appear to be candidate for dialysis-seen by nephrology. Recent Labs  Lab 10/10/21 0725 10/11/21 0847 10/12/21 0530 10/13/21 0441 10/14/21 0422  BUN 119* 115* 118* 122* 130*  CREATININE 3.63* 3.54* 3.49*  3.54* 3.54*     Hypotension torsemide dose decreased, continue midodrine.   Hyponatremia:Stable   Acute respiratory failure with hypoxia: Due to patient's CHF.  Continue supplemental oxygen to maintain saturation at least 93% or above   PAF: Rate controlled, not on anticoagulation at this time due to patient's  preference amiodarone on hold   Demand ischemia: In the setting of CHF. Anemia in other chronic diseases -hemoglobin stable in the setting of CKD and hypothyroidism.  Monitor   Goals of care Debility Deconditioning: Overall prognosis does not appear bright, prognosis is guarded given complex comorbidities severe systolic dysfunction with CKD.  Palliative care following, MOST form completed 1/24, currently is DNR.  Patient may benefit with transition to hospice level care when patient family is ready, at this time pursuing SNF AS PER patient's wishes.  Pressure Ulcer: Pressure Injury 09/29/21 Buttocks Right;Mid Stage 2 -  Partial thickness loss of dermis presenting as a shallow open injury with a red, pink wound bed without slough. (Active)  09/29/21 0230  Location: Buttocks  Location Orientation: Right;Mid  Staging: Stage 2 -  Partial thickness loss of dermis presenting as a shallow open injury with a red, pink wound bed without slough.  Wound Description (Comments):   Present on Admission: Yes     Pressure Injury 09/29/21 Buttocks Left;Mid Stage 2 -  Partial thickness loss of dermis presenting as a shallow open injury with a red, pink wound bed without slough. (Active)  09/29/21 0230  Location: Buttocks  Location Orientation: Left;Mid  Staging: Stage 2 -  Partial thickness loss of dermis presenting as a shallow open injury with a red, pink wound bed without slough.  Wound Description (Comments):   Present on Admission: Yes    Consults: Cardiology, nephrology, palliative care  Subjective: Alert awake oriented self-feeding, d blood pressure stable in 90s alert awake, [name] new changes,plan is for skilled nursing facility today once available COVID test ordered.    Discharge Exam: Vitals:   10/16/21 0633 10/16/21 0800  BP: 98/68   Pulse: 66   Resp: 20   Temp: 98.4 F (36.9 C)   SpO2: 98% 97%   General: Pt is alert, awake, not in acute distress Cardiovascular: RRR, S1/S2 +, [name]  rubs, [name] gallops Respiratory: CTA bilaterally, [name] wheezing, [name] rhonchi Abdominal: Soft, NT, ND, bowel sounds + Extremities: [name] edema, [name] cyanosis  Discharge Instructions  Discharge Instructions     (HEART FAILURE PATIENTS) Call MD:  Anytime you have any of the following symptoms: 1) 3 pound weight gain in 24 hours or 5 pounds in 1 week 2) shortness of breath, with or without a dry hacking cough 3) swelling in the hands, feet or stomach 4) if you have to sleep on extra pillows at night in order to breathe.   Complete by: As directed    Discharge instructions   Complete by: As directed    Check TSH free T3 and free T4 in 3 weeks to adjust your thyroid supplement dose   Check CBC BMP in 5 to 7 days  Discuss with the cardiology regarding her amiodarone at its been held due to thyroid dysfunction  Please call call MD or return to ER for similar or worsening recurring problem that brought you to hospital or if any fever,nausea/vomiting,abdominal pain, uncontrolled pain, chest pain,  shortness of breath or any other alarming symptoms.  Please follow-up your doctor as instructed in a week time and call the office for appointment.  Please avoid alcohol, smoking,  or any other illicit substance and maintain healthy habits including taking your regular medications as prescribed.  You were cared for by a hospitalist during your hospital stay. If you have any questions about your discharge medications or the care you received while you were in the hospital after you are discharged, you can call the unit and ask to speak with the hospitalist on call if the hospitalist that took care of you is not available.  Once you are discharged, your primary care physician will handle any further medical issues. Please note that [name] REFILLS for any discharge medications will be authorized once you are discharged, as it is imperative that you return to your primary care physician (or establish a relationship with a  primary care physician if you do not have one) for your aftercare needs so that they can reassess your need for medications and monitor your lab values   Discharge wound care:   Complete by: As directed    Continue foam dressing on left and right buttock and dry dressing on the skin tear   Increase activity slowly   Complete by: As directed       Allergies as of 10/16/2021       Reactions   Morphine Anaphylaxis   Oxycodone Other (See Comments)   Hypotension and bradycardia   Carvedilol    Dizziness and syncope    Cefuroxime Axetil Swelling   Iodinated Contrast Media Rash   Other reaction(s): Asthenia (finding), Other (qualifier value) paralyzed legs Other reaction(s): RASH   Lisinopril Cough, Other (See Comments)        Medication List     STOP taking these medications    amiodarone 200 MG tablet Commonly known as: PACERONE   losartan 25 MG tablet Commonly known as: COZAAR       TAKE these medications    (feeding supplement) PROSource Plus liquid Take 30 mLs by mouth 2 (two) times daily between meals.   acetaminophen 500 MG tablet Commonly known as: TYLENOL Take 1,000 mg by mouth every 6 (six) hours as needed for mild pain.   albuterol 108 (90 Base) MCG/ACT inhaler Commonly known as: VENTOLIN HFA Inhale 1-2 puffs into the lungs every 6 (six) hours as needed for wheezing or shortness of breath.   aspirin EC 81 MG tablet Take 1 tablet (81 mg total) by mouth 2 (two) times daily. Swallow whole.   calcitRIOL 0.25 MCG capsule Commonly known as: ROCALTROL Take 0.25 mcg by mouth daily.   levothyroxine 75 MCG tablet Commonly known as: SYNTHROID Take 1 tablet (75 mcg total) by mouth daily at 6 (six) AM.   liothyronine 5 MCG tablet Commonly known as: CYTOMEL Take 1 tablet (5 mcg total) by mouth daily.   midodrine 10 MG tablet Commonly known as: PROAMATINE Take 1 tablet (10 mg total) by mouth 3 (three) times daily with meals.   multivitamin with minerals  Tabs tablet Take 1 tablet by mouth daily.   pravastatin 20 MG tablet Commonly known as: PRAVACHOL Take 20 mg by mouth every evening.   Torsemide 40 MG Tabs Take 40 mg by mouth daily. What changed:  medication strength how much to take               Discharge Care Instructions  (From admission, onward)           Start     Ordered   10/13/21 0000  Discharge wound care:       Comments: Continue foam dressing on  left and right buttock and dry dressing on the skin tear   10/13/21 1130            Follow-up Information     Baxter Hire, MD Follow up in 1 week(s).   Specialty: Internal Medicine Contact information: [place] [address] [phone]         Andrez Grime, MD Follow up in 1 week(s).   Specialty: Cardiology Contact information: [place] [address] [phone]                Allergies  Allergen Reactions   Morphine Anaphylaxis   Oxycodone Other (See Comments)    Hypotension and bradycardia   Carvedilol     Dizziness and syncope    Cefuroxime Axetil Swelling   Iodinated Contrast Media Rash    Other reaction(s): Asthenia (finding), Other (qualifier value) paralyzed legs Other reaction(s): RASH    Lisinopril Cough and Other (See Comments)    The results of significant diagnostics from this hospitalization (including imaging, microbiology, ancillary and laboratory) are listed below for reference.    Microbiology: [name] results found for this or any previous visit (from the past 240 hour(s)).  Procedures/Studies: CT Head Wo Contrast  Result Date: 09/28/2021 CLINICAL DATA:  Altered mental status.  Unresponsive. EXAM: CT HEAD WITHOUT CONTRAST TECHNIQUE: Contiguous axial images were obtained from the base of the skull through the vertex without intravenous contrast. RADIATION DOSE REDUCTION: This exam was performed according to the departmental dose-optimization program which  includes automated exposure control, adjustment of the mA and/or kV according to patient size and/or use of iterative reconstruction technique. COMPARISON:  05/11/2020 FINDINGS: Brain: Stable degree of atrophy and chronic small vessel ischemia. [name] intracranial hemorrhage, mass effect, or midline shift. [name] hydrocephalus. The basilar cisterns are patent. [name] evidence of territorial infarct or acute ischemia. [name] extra-axial or intracranial fluid collection. Vascular: Atherosclerosis of skullbase vasculature without hyperdense vessel or abnormal calcification. Patchy air in the cavernous sinuses, typically related to IV catheter placement. Skull: [name] fracture or focal lesion. Sinuses/Orbits: [name] evidence of acute fracture. [name] paranasal sinus inflammation. Bilateral cataract resection. Mastoid air cells are clear. Other: There is air within the subcutaneous soft tissues about the facial bones and temporal as musculature. This is typically related to IV catheter placement. IMPRESSION: 1. [name] acute intracranial abnormality. 2. Stable atrophy and chronic small vessel ischemia. 3. Patchy air in the cavernous sinuses and periorbital soft tissues, typically related to IV catheter placement. Electronically Signed   By: Keith Rake M.D.   On: 09/28/2021 21:06   DG Chest Port 1 View  Result Date: 10/07/2021 CLINICAL DATA:  Unresponsive EXAM: PORTABLE CHEST 1 VIEW COMPARISON:  09/30/2021, CT 05/19/2001, 05/31/2021, chest x-ray 09/09/2020 FINDINGS: Post sternotomy changes and valve prosthesis. Cardiomegaly with vascular congestion and small right greater than left pleural effusions. Probable underlying interstitial edema. Patchy airspace opacities at the bases without significant change IMPRESSION: 1. Overall [name] great change in radiographic appearance of the chest since 09/30/2021. 2. Cardiomegaly with vascular congestion, small bilateral effusions and probable pulmonary edema. 3. Patchy airspace disease at the bases which may  be due to atelectasis or pneumonia Electronically Signed   By: Donavan Foil M.D.   On: 10/07/2021 15:46   DG Chest Port 1 View  Result Date: 09/30/2021 CLINICAL DATA:  Encounter for central line placement EXAM: PORTABLE CHEST 1 VIEW COMPARISON:  Chest x-ray 09/30/2021 5:47 p.m., CT chest 05/19/2021, chest x-ray 09/29/2021 FINDINGS: Interval  advancement of right internal jugular central venous catheter with tip overlying the expected region of the superior caval junction. Lines and tubes overlie the patient. Stable enlarged cardiac silhouette. The heart and mediastinal contours are unchanged. Aortic calcification prominent hilar vasculature. Patchy airspace opacities within the right lower lobe. Limited evaluation left lower lobe due to overlying cardiac paddle. Increased interstitial markings. Bilateral trace to small volume pleural effusion. [name] pneumothorax. [name] acute osseous abnormality. IMPRESSION: 1. Interval advancement of right internal jugular central venous catheter with tip overlying the expected region of the superior caval junction. 2. Pulmonary edema. 3. Bilateral trace to small volume pleural effusion. 4. Patchy right lower lobe opacities suggestive of infection/inflammation. Followup PA and lateral chest X-ray is recommended in 3-4 weeks following therapy to ensure resolution and exclude underlying malignancy. Electronically Signed   By: Iven Finn M.D.   On: 09/30/2021 19:12   DG Chest Port 1 View  Result Date: 09/30/2021 CLINICAL DATA:  Central line placement. Clinical concern for central lying being pulled back. EXAM: PORTABLE CHEST 1 VIEW COMPARISON:  09/29/2021. FINDINGS: Right internal jugular central venous line now has its tip projecting over the right apex consistent with placement in the internal jugular vein superior to its confluence with the subclavian vein. [name] pneumothorax. Interstitial and hazy airspace lung opacities are noted that are without significant change from the  prior exam. Small bilateral pleural effusions are stable. IMPRESSION: 1. Retracted right internal jugular central venous line as detailed. 2. [name] other change from the prior chest radiograph. Electronically Signed   By: Lajean Manes M.D.   On: 09/30/2021 18:05   DG Chest Port 1 View  Result Date: 09/29/2021 CLINICAL DATA:  Encounter for central line placement EXAM: PORTABLE CHEST 1 VIEW COMPARISON:  Yesterday FINDINGS: New right IJ line with tip at the upper cavoatrial junction. Cardiomegaly. Prior CABG and aortic valve replacement. Pleural effusions and pulmonary opacity which are unchanged. [name] visible pneumothorax. IMPRESSION: 1. Right IJ line without complicating feature. 2. Stable pleural effusions and pulmonary opacity. Electronically Signed   By: Jorje Guild M.D.   On: 09/29/2021 04:42   DG Chest Portable 1 View  Result Date: 09/28/2021 CLINICAL DATA:  Altered mental status. EXAM: PORTABLE CHEST 1 VIEW COMPARISON:  07/28/2021 FINDINGS: Post median sternotomy with prosthetic cardiac valve. The heart is enlarged. Bilateral pleural effusions and bibasilar opacities, mildly increased. Right pleural effusion is likely loculated tracking towards the apex. Chronic interstitial coarsening. [name] pneumothorax. IMPRESSION: Cardiomegaly with bilateral pleural effusions and bibasilar opacities, mildly increased from prior radiograph. Chronic interstitial coarsening. Electronically Signed   By: Keith Rake M.D.   On: 09/28/2021 20:58   ECHOCARDIOGRAM COMPLETE  Result Date: 10/03/2021    ECHOCARDIOGRAM REPORT   Patient Name:   MARQUAN VOKES Date of Exam: 10/03/2021 Medical Rec #:  [id number]       Height:       72.0 in Accession #:    [id number]      Weight:       166.0 lb Date of Birth:  12/23/1930       BSA:          1.968 m Patient Age:    88 years        BP:           103/69 mmHg Patient Gender: M               HR:           61 bpm. Exam  Location:  [facility] Procedure: 2D Echo, Cardiac Doppler and Color  Doppler Indications:     CHF-acute systolic M54.65  History:         Patient has prior history of Echocardiogram examinations, most                  recent 01/31/2021. CHF; Risk Factors:Hypertension. CKD.                  Aortic Valve: bioprosthetic valve is present in the aortic                  position.  Sonographer:     Sherrie Sport Referring Phys:  [id number] Bradly Bienenstock Diagnosing Phys: Serafina Royals MD  Sonographer Comments: [name] subcostal window. IMPRESSIONS  1. Left ventricular ejection fraction, by estimation, is <20%. The left ventricle has severely decreased function. The left ventricle demonstrates global hypokinesis. The left ventricular internal cavity size was severely dilated. Left ventricular diastolic parameters were normal.  2. Right ventricular systolic function is mildly reduced. The right ventricular size is moderately enlarged.  3. Left atrial size was moderately dilated.  4. Right atrial size was moderately dilated.  5. The mitral valve is myxomatous. Moderate to severe mitral valve regurgitation.  6. Tricuspid valve regurgitation is moderate to severe.  7. The aortic valve has been repaired/replaced. Aortic valve regurgitation is trivial. There is a bioprosthetic valve present in the aortic position. Echo findings are consistent with normal structure and function of the aortic valve prosthesis. FINDINGS  Left Ventricle: Left ventricular ejection fraction, by estimation, is <20%. The left ventricle has severely decreased function. The left ventricle demonstrates global hypokinesis. The left ventricular internal cavity size was severely dilated. There is [name] left ventricular hypertrophy. Left ventricular diastolic parameters were normal. Right Ventricle: The right ventricular size is moderately enlarged. [name] increase in right ventricular wall thickness. Right ventricular systolic function is mildly reduced. Left Atrium: Left atrial size was moderately dilated. Right Atrium: Right atrial size was  moderately dilated. Pericardium: There is [name] evidence of pericardial effusion. Mitral Valve: The mitral valve is myxomatous. Moderate to severe mitral valve regurgitation. MV peak gradient, 2.1 mmHg. The mean mitral valve gradient is 1.0 mmHg. Tricuspid Valve: The tricuspid valve is normal in structure. Tricuspid valve regurgitation is moderate to severe. Aortic Valve: The aortic valve has been repaired/replaced. Aortic valve regurgitation is trivial. Aortic valve mean gradient measures 3.3 mmHg. Aortic valve peak gradient measures 6.1 mmHg. Aortic valve area, by VTI measures 1.02 cm. There is a bioprosthetic valve present in the aortic position. Echo findings are consistent with normal structure and function of the aortic valve prosthesis. Pulmonic Valve: The pulmonic valve was normal in structure. Pulmonic valve regurgitation is mild. Aorta: The aortic root and ascending aorta are structurally normal, with [name] evidence of dilitation. IAS/Shunts: [name] atrial level shunt detected by color flow Doppler.  LEFT VENTRICLE PLAX 2D LVIDd:         6.00 cm      Diastology LVIDs:         5.60 cm      LV e' medial:    3.48 cm/s LV PW:         0.80 cm      LV E/e' medial:  19.6 LV IVS:        1.10 cm      LV e' lateral:   3.70 cm/s LVOT diam:     2.10 cm      LV  E/e' lateral: 18.4 LV SV:         20 LV SV Index:   10 LVOT Area:     3.46 cm  LV Volumes (MOD) LV vol d, MOD A2C: 245.0 ml LV vol d, MOD A4C: 173.0 ml LV vol s, MOD A2C: 179.0 ml LV vol s, MOD A4C: 148.0 ml LV SV MOD A2C:     66.0 ml LV SV MOD A4C:     173.0 ml LV SV MOD BP:      43.0 ml RIGHT VENTRICLE RV Basal diam:  5.20 cm RV S prime:     5.55 cm/s TAPSE (M-mode): 1.1 cm LEFT ATRIUM              Index        RIGHT ATRIUM           Index LA diam:        4.50 cm  2.29 cm/m   RA Area:     30.40 cm LA Vol (A2C):   121.0 ml 61.47 ml/m  RA Volume:   93.60 ml  47.55 ml/m LA Vol (A4C):   78.7 ml  39.98 ml/m LA Biplane Vol: 99.7 ml  50.65 ml/m  AORTIC VALVE                     PULMONIC VALVE AV Area (Vmax):    0.84 cm     PV Vmax:          0.50 m/s AV Area (Vmean):   0.87 cm     PV Vmean:         31.600 cm/s AV Area (VTI):     1.02 cm     PV VTI:           0.092 m AV Vmax:           123.67 cm/s  PV Peak grad:     1.0 mmHg AV Vmean:          82.900 cm/s  PV Mean grad:     0.0 mmHg AV VTI:            0.199 m      PR End Diast Vel: 11.72 msec AV Peak Grad:      6.1 mmHg     RVOT Peak grad:   2 mmHg AV Mean Grad:      3.3 mmHg LVOT Vmax:         30.00 cm/s LVOT Vmean:        20.800 cm/s LVOT VTI:          0.059 m LVOT/AV VTI ratio: 0.29  AORTA Ao Root diam: 4.33 cm MITRAL VALVE               TRICUSPID VALVE MV Area (PHT): 3.61 cm    TR Peak grad:   53.6 mmHg MV Area VTI:   1.23 cm    TR Vmax:        366.00 cm/s MV Peak grad:  2.1 mmHg MV Mean grad:  1.0 mmHg    SHUNTS MV Vmax:       0.72 m/s    Systemic VTI:  0.06 m MV Vmean:      51.9 cm/s   Systemic Diam: 2.10 cm MV Decel Time: 210 msec    Pulmonic VTI:  0.162 m MV E velocity: 68.10 cm/s MV A velocity: 55.70 cm/s MV E/A ratio:  1.22 Serafina Royals MD Electronically signed by Serafina Royals MD  Signature Date/Time: 10/03/2021/1:49:11 PM    Final     Labs: BNP (last 3 results) Recent Labs    01/31/21 1109 05/29/21 1736 09/29/21 0006  BNP >4,500.0* 4,370.9* 8,032.1*   Basic Metabolic Panel: Recent Labs  Lab 10/10/21 0725 10/11/21 0847 10/12/21 0530 10/13/21 0441 10/14/21 0422  NA 138 137 140 137 136  K 3.6 3.9 3.9 4.0 4.0  CL 101 102 102 99 98  CO2 25 24 28 27 28   GLUCOSE 92 89 97 111* 94  BUN 119* 115* 118* 122* 130*  CREATININE 3.63* 3.54* 3.49* 3.54* 3.54*  CALCIUM 8.1* 8.2* 8.4* 8.5* 8.6*   Liver Function Tests: [name] results for input(s): AST, ALT, ALKPHOS, BILITOT, PROT, ALBUMIN in the last 168 hours.  [name] results for input(s): LIPASE, AMYLASE in the last 168 hours. [name] results for input(s): AMMONIA in the last 168 hours. CBC: Recent Labs  Lab 10/10/21 0725  WBC 6.0  NEUTROABS 4.1  HGB 9.0*   HCT 27.2*  MCV 104.2*  PLT 114*   Cardiac Enzymes: [name] results for input(s): CKTOTAL, CKMB, CKMBINDEX, TROPONINI in the last 168 hours. BNP: Invalid input(s): POCBNP CBG: [name] results for input(s): GLUCAP in the last 168 hours. D-Dimer [name] results for input(s): DDIMER in the last 72 hours. Hgb A1c [name] results for input(s): HGBA1C in the last 72 hours. Lipid Profile [name] results for input(s): CHOL, HDL, LDLCALC, TRIG, CHOLHDL, LDLDIRECT in the last 72 hours. Thyroid function studies [name] results for input(s): TSH, T4TOTAL, T3FREE, THYROIDAB in the last 72 hours.  Invalid input(s): FREET3 Anemia work up [name] results for input(s): VITAMINB12, FOLATE, FERRITIN, TIBC, IRON, RETICCTPCT in the last 72 hours. Urinalysis    Component Value Date/Time   COLORURINE YELLOW (A) 09/28/2021 2019   APPEARANCEUR CLEAR (A) 09/28/2021 2019   APPEARANCEUR Clear 11/22/2015 0950   LABSPEC 1.011 09/28/2021 2019   PHURINE 5.0 09/28/2021 2019   GLUCOSEU NEGATIVE 09/28/2021 2019   HGBUR MODERATE (A) 09/28/2021 2019   BILIRUBINUR NEGATIVE 09/28/2021 2019   BILIRUBINUR Negative 11/22/2015 [place] 09/28/2021 2019   PROTEINUR 30 (A) 09/28/2021 2019   UROBILINOGEN 1.0 02/16/2010 1336   NITRITE NEGATIVE 09/28/2021 2019   LEUKOCYTESUR NEGATIVE 09/28/2021 2019   Sepsis Labs Invalid input(s): PROCALCITONIN,  WBC,  LACTICIDVEN Microbiology [name] results found for this or any previous visit (from the past 240 hour(s)).   Time coordinating discharge: 35 minutes  SIGNED: Antonieta Pert, MD  [facility] 10/16/2021, 8:28 AM  If 7PM-7AM, please contact night-coverage [email or web address]

## 2021-10-13 NOTE — Care Management Important Message (Signed)
Important Message  Patient Details  Name: [name] MRN: [id number] Date of Birth: [birth year]   Medicare Important Message Given:  Yes     Dannette Barbara 10/13/2021, 2:01 PM

## 2021-10-13 NOTE — TOC Progression Note (Addendum)
Transition of Care [facility]) - Progression Note    Patient Details  Name: [name] MRN: [id number] Date of Birth: Jan 25, 1931  Transition of Care [facility]) CM/SW [place], [place] Phone Number: 10/13/2021, 1:18 PM  Clinical Narrative:     Patient daughter [name] called this CSW, verbally aggressive and yelling during phone call. CSW attempted to re direct Pam to focus on any questions regarding patient's discharge plan, she identified no questions regarding discharge plan.   Pam expressed concern with lack of updates regarding patient, CSW inquired if Butch Penny had been relaying information to her, as [name] and Butch Penny are top two contacts listed for patient. She reports yes, however she reports feeling sad regarding her being last on the contact list .CSW explain that we are unable to contact every family member listed in a patient's chart, however encouraged her and family to work on identifying a HCPOA so that any question of who is Media planner is resolved.   Pam continued to yell at [place] and reported she is going to be "coming up there". CSW has notified floor staff as appears Pam was also verbally aggressive towards staff last night.      This CSW will continue to contact Butch Penny and [place] regarding discharge planning as they have been helpfully and respectfully  involved in deciding discharge plan and updating/involving Pam as well.          Expected Discharge Plan and Services                                                 Social Determinants of Health (SDOH) Interventions    Readmission Risk Interventions Readmission Risk Prevention Plan 05/31/2021  Transportation Screening Complete  PCP or Specialist Appt within 3-5 Days Complete  Social Work Consult for [place] Planning/Counseling Complete  Palliative Care Screening Not Applicable  Medication Review Press photographer) Complete  Some recent data might be hidden

## 2021-10-13 NOTE — Progress Notes (Signed)
Physical Therapy Treatment Patient Details Name: [name] MRN: [id number] DOB: [birth year] Today's Date: 10/13/2021   History of Present Illness Pt is an 88 y.o male with significant PMH of  CKD stage IV, hypertension, proteinuria, hyperparathyroidism, HLD, aspiration pneumonia, EF 15-20%, ischemic cardiomyopathy, bilateral carotid stenosis, severe aortic stenosis, AAA, MGUS, paroxysmal SVT, CABG, PDA, status post bioprosthetic valve replacement in 2011 who presented to the ED with unresponsiveness. MD assessment includes: Myxedema coma, cadiogenic shock, acute on chronic CHF, AKI, thrombocytopenia, anemia, A-fib, acute respiratory failure, pressure injury of the buttock stage 2, and hyponatremia.    PT Comments    Pt was pleasant and motivated to participate during the session and put forth good effort throughout. Pt required physical assistance with both bed mobility and transfers but once in standing was steady and able to take several steps at the EOB without LOB or need for physical assist. Pt fatigued quickly this session and was only able to amb around 3' before needing to sit with SpO2 and HR both WNL on [place]. Pt will benefit from PT services in a SNF setting upon discharge to safely address deficits listed in patient problem list for decreased caregiver assistance and eventual return to PLOF.     Recommendations for follow up therapy are one component of a multi-disciplinary discharge planning process, led by the attending physician.  Recommendations may be updated based on patient status, additional functional criteria and insurance authorization.  Follow Up Recommendations  Skilled nursing-short term rehab (<3 hours/day)     Assistance Recommended at Discharge Frequent or constant Supervision/Assistance  Patient can return home with the following Two people to help with walking and/or transfers;A lot of help with bathing/dressing/bathroom;Assistance with cooking/housework;Direct  supervision/assist for medications management;Direct supervision/assist for financial management;Assist for transportation   Equipment Recommendations  Other (comment) (TBD at next venue of care)    Recommendations for Other Services       Precautions / Restrictions Precautions Precautions: Fall Restrictions Weight Bearing Restrictions: No     Mobility  Bed Mobility Overal bed mobility: Needs Assistance Bed Mobility: Supine to Sit     Supine to sit: Min assist, Mod assist     General bed mobility comments: Increased time and effort with min-mod assist for BLE and trunk control    Transfers Overall transfer level: Needs assistance Equipment used: Rolling walker (2 wheels) Transfers: Sit to/from Stand Sit to Stand: Min assist, From elevated surface           General transfer comment: Mod verbal cues for hand placement and increased trunk flexion with Min A to come to full upright standing    Ambulation/Gait Ambulation/Gait assistance: Min guard Gait Distance (Feet): 3 Feet Assistive device: Rolling walker (2 wheels) Gait Pattern/deviations: Step-to pattern, Shuffle, Trunk flexed Gait velocity: decreased     General Gait Details: Decreased activity tolerance this session with pt only able to amb a max of 3' near the EOB and from EOB to chair before fatiguing and requesting to return to sitting; SpO2 and HR WNL on [place]   Stairs             Wheelchair Mobility    Modified Rankin (Stroke Patients Only)       Balance Overall balance assessment: Needs assistance Sitting-balance support: Feet supported Sitting balance-Leahy Scale: Fair     Standing balance support: Bilateral upper extremity supported, During functional activity Standing balance-Leahy Scale: Fair  Cognition Arousal/Alertness: Awake/alert Behavior During Therapy: Flat affect Overall Cognitive Status: No family/caregiver present to determine  baseline cognitive functioning                                          Exercises Total Joint Exercises Ankle Circles/Pumps: AROM, Strengthening, Both, 10 reps Quad Sets: Strengthening, Both, 10 reps Gluteal Sets: Strengthening, Both, 10 reps Hip ABduction/ADduction: AAROM, Strengthening, Both, 5 reps Long Arc Quad: AROM, Strengthening, Both, 10 reps Knee Flexion: AROM, Strengthening, Both, 10 reps Other Exercises Other Exercises: HEP education and review for BLE APs, QS, and GS x 10 each every 1-2 hours daily    General Comments        Pertinent Vitals/Pain Pain Assessment Pain Assessment: No/denies pain    Home Living                          Prior Function            PT Goals (current goals can now be found in the care plan section) Progress towards PT goals: PT to reassess next treatment    Frequency    Min 2X/week      PT Plan Current plan remains appropriate    Co-evaluation              AM-PAC PT "6 Clicks" Mobility   Outcome Measure  Help needed turning from your back to your side while in a flat bed without using bedrails?: A Little Help needed moving from lying on your back to sitting on the side of a flat bed without using bedrails?: A Little Help needed moving to and from a bed to a chair (including a wheelchair)?: A Little Help needed standing up from a chair using your arms (e.g., wheelchair or bedside chair)?: A Little Help needed to walk in hospital room?: Total Help needed climbing 3-5 steps with a railing? : Total 6 Click Score: 14    End of Session Equipment Utilized During Treatment: Gait belt;Oxygen Activity Tolerance: Patient tolerated treatment well Patient left: in chair;with chair alarm set;with call bell/phone within reach Nurse Communication: Mobility status PT Visit Diagnosis: Unsteadiness on feet (R26.81);Difficulty in walking, not elsewhere classified (R26.2);Muscle weakness (generalized)  (M62.81)     Time: 4496-7591 PT Time Calculation (min) (ACUTE ONLY): 23 min  Charges:  $Therapeutic Exercise: 8-22 mins $Therapeutic Activity: 8-22 mins                     D. Scott Harlyn Italiano PT, DPT 10/13/21, 4:01 PM

## 2021-10-13 NOTE — TOC Progression Note (Addendum)
Transition of Care [facility]) - Progression Note    Patient Details  Name: [name] MRN: [id number] Date of Birth: Oct 14, 1930  Transition of Care [facility]) CM/SW Contact  Anselm Pancoast, RN Phone Number: 10/13/2021, 4:16 PM  Clinical Narrative:    LVMM for daughter, Dell Ponto to update on bed offer.   Anticipating patient will discharge Monday to [facility]. Daughter was requesting possible placement in [place]. Bed request sent out and awaiting possible bed sooner. Will keep family updated.         Expected Discharge Plan and Services           Expected Discharge Date: 10/13/21                                     Social Determinants of Health (SDOH) Interventions    Readmission Risk Interventions Readmission Risk Prevention Plan 05/31/2021  Transportation Screening Complete  PCP or Specialist Appt within 3-5 Days Complete  Social Work Consult for [place] Planning/Counseling Complete  Palliative Care Screening Not Applicable  Medication Review Press photographer) Complete  Some recent data might be hidden

## 2021-10-13 NOTE — Progress Notes (Signed)
[facility]  ROUNDING NOTE   Subjective:   Patient sitting up eating breakfast Tolerating meals Denies shortness of breath Minimal edema Generalized bruising  Objective:  Vital signs in last 24 hours:  Temp:  [97.5 F (36.4 C)-98.6 F (37 C)] 98.2 F (36.8 C) (01/27 1116) Pulse Rate:  [63-74] 70 (01/27 1116) Resp:  [17-19] 17 (01/27 1116) BP: (93-105)/(60-70) 93/60 (01/27 1116) SpO2:  [97 %-100 %] 97 % (01/27 1116) Weight:  [66.9 kg] 66.9 kg (01/27 0420)  Weight change: -0.36 kg Filed Weights   10/11/21 0500 10/12/21 0438 10/13/21 0420  Weight: 67.4 kg 67.3 kg 66.9 kg    Intake/Output: I/O last 3 completed shifts: In: 480 [P.O.:480] Out: 2650 [Urine:2650]   Intake/Output this shift:  Total I/O In: 240 [P.O.:240] Out: -   Physical Exam: General: In no acute distress  Head: Normocephalic, atraumatic. Moist oral mucosal membranes  Eyes: Anicteric  Lungs:  Respirations even, Clear to auscultation, [place] O2  Heart: Regular rate and rhythm  Abdomen:  Soft, nontender, bowel sounds present  Extremities: trace peripheral edema  Neurologic: Awake, alert, following commands,fogetful  Skin: Bilateral arms with ecchymotic areas   Access: NONE    Basic Metabolic Panel: Recent Labs  Lab 10/09/21 0501 10/10/21 0725 10/11/21 0847 10/12/21 0530 10/13/21 0441  NA 138 138 137 140 137  K 4.0 3.6 3.9 3.9 4.0  CL 104 101 102 102 99  CO2 24 25 24 28 27   GLUCOSE 93 92 89 97 111*  BUN 108* 119* 115* 118* 122*  CREATININE 3.83* 3.63* 3.54* 3.49* 3.54*  CALCIUM 8.0* 8.1* 8.2* 8.4* 8.5*  PHOS 5.1*  --   --   --   --      Liver Function Tests: Recent Labs  Lab 10/07/21 0654 10/09/21 0501  AST 35  --   ALT 27  --   ALKPHOS 61  --   BILITOT 1.0  --   PROT 6.5  --   ALBUMIN 3.1* 3.0*    No results for input(s): LIPASE, AMYLASE in the last 168 hours. No results for input(s): AMMONIA in the last 168 hours.  CBC: Recent Labs  Lab 10/07/21 0654  10/09/21 0501 10/10/21 0725  WBC 7.9 6.6 6.0  NEUTROABS  --   --  4.1  HGB 9.2* 8.9* 9.0*  HCT 27.3* 26.5* 27.2*  MCV 102.6* 102.7* 104.2*  PLT 109* 120* 114*     Cardiac Enzymes: No results for input(s): CKTOTAL, CKMB, CKMBINDEX, TROPONINI in the last 168 hours.  BNP: Invalid input(s): POCBNP  CBG: No results for input(s): GLUCAP in the last 168 hours.   Microbiology: Results for orders placed or performed during the hospital encounter of 09/28/21  Resp Panel by RT-PCR (Flu A&B, Covid) Nasopharyngeal Swab     Status: None   Collection Time: 09/28/21  8:19 PM   Specimen: Nasopharyngeal Swab; Nasopharyngeal(NP) swabs in vial transport medium  Result Value Ref Range Status   SARS Coronavirus 2 by RT PCR NEGATIVE NEGATIVE Final    Comment: (NOTE) SARS-CoV-2 target nucleic acids are NOT DETECTED.  The SARS-CoV-2 RNA is generally detectable in upper respiratory specimens during the acute phase of infection. The lowest concentration of SARS-CoV-2 viral copies this assay can detect is 138 copies/mL. A negative result does not preclude SARS-Cov-2 infection and should not be used as the sole basis for treatment or other patient management decisions. A negative result may occur with  improper specimen collection/handling, submission of specimen other than nasopharyngeal  swab, presence of viral mutation(s) within the areas targeted by this assay, and inadequate number of viral copies(<138 copies/mL). A negative result must be combined with clinical observations, patient history, and epidemiological information. The expected result is Negative.  Fact Sheet for Patients:  EntrepreneurPulse.com.au  Fact Sheet for Healthcare Providers:  IncredibleEmployment.be  This test is no t yet approved or cleared by the [place] FDA and  has been authorized for detection and/or diagnosis of SARS-CoV-2 by FDA under an Emergency Use Authorization  (EUA). This EUA will remain  in effect (meaning this test can be used) for the duration of the COVID-19 declaration under Section 564(b)(1) of the Act, 21 U.S.C.section 360bbb-3(b)(1), unless the authorization is terminated  or revoked sooner.       Influenza A by PCR NEGATIVE NEGATIVE Final   Influenza B by PCR NEGATIVE NEGATIVE Final    Comment: (NOTE) The Xpert Xpress SARS-CoV-2/FLU/RSV plus assay is intended as an aid in the diagnosis of influenza from Nasopharyngeal swab specimens and should not be used as a sole basis for treatment. Nasal washings and aspirates are unacceptable for Xpert Xpress SARS-CoV-2/FLU/RSV testing.  Fact Sheet for Patients: EntrepreneurPulse.com.au  Fact Sheet for Healthcare Providers: IncredibleEmployment.be  This test is not yet approved or cleared by the [place] FDA and has been authorized for detection and/or diagnosis of SARS-CoV-2 by FDA under an Emergency Use Authorization (EUA). This EUA will remain in effect (meaning this test can be used) for the duration of the COVID-19 declaration under Section 564(b)(1) of the Act, 21 U.S.C. section 360bbb-3(b)(1), unless the authorization is terminated or revoked.  Performed at [facility], [address] [place], [place] [address]   Culture, blood (routine x 2)     Status: None   Collection Time: 09/28/21  9:47 PM   Specimen: BLOOD  Result Value Ref Range Status   Specimen Description BLOOD LEFT FOREARM  Final   Special Requests   Final    BOTTLES DRAWN AEROBIC AND ANAEROBIC Blood Culture adequate volume   Culture   Final    NO GROWTH 5 DAYS Performed at [facility], [address] [place], [place] [address]    Report Status 10/03/2021 FINAL  Final  Culture, blood (routine x 2)     Status: None   Collection Time: 09/28/21  9:47 PM   Specimen: BLOOD  Result Value Ref Range Status   Specimen Description BLOOD LEFT FOREARM   Final   Special Requests IN PEDIATRIC BOTTLE Blood Culture adequate volume  Final   Culture   Final    NO GROWTH 5 DAYS Performed at [facility], [address] [place], [place] [address]    Report Status 10/03/2021 FINAL  Final  MRSA Next Gen by PCR, Nasal     Status: Abnormal   Collection Time: 09/29/21  2:38 AM   Specimen: Nasal Mucosa; Nasal Swab  Result Value Ref Range Status   MRSA by PCR Next Gen DETECTED (A) NOT DETECTED Final    Comment: RESULT CALLED TO, READ BACK BY AND VERIFIED WITH: Hansel Starling @0353  on 09/29/21 SKL (NOTE) The GeneXpert MRSA Assay (FDA approved for NASAL specimens only), is one component of a comprehensive MRSA colonization surveillance program. It is not intended to diagnose MRSA infection nor to guide or monitor treatment for MRSA infections. Test performance is not FDA approved in patients less than 7 years old. Performed at [facility], [address] [place], [place] [address]  Coagulation Studies: No results for input(s): LABPROT, INR in the last 72 hours.   Urinalysis: No results for input(s): COLORURINE, LABSPEC, PHURINE, GLUCOSEU, HGBUR, BILIRUBINUR, KETONESUR, PROTEINUR, UROBILINOGEN, NITRITE, LEUKOCYTESUR in the last 72 hours.  Invalid input(s): APPERANCEUR     Imaging: No results found.   Medications:      (feeding supplement) PROSource Plus  30 mL Oral BID BM   vitamin C  500 mg Oral BID   aspirin EC  81 mg Oral Daily   calcitRIOL  0.25 mcg Oral Daily   Chlorhexidine Gluconate Cloth  6 each Topical Q0600   heparin  5,000 Units Subcutaneous Q8H   levothyroxine  75 mcg Oral Q0600   liothyronine  5 mcg Oral Daily   midodrine  10 mg Oral TID WC   multivitamin with minerals  1 tablet Oral Daily   mupirocin ointment   Nasal BID   pravastatin  20 mg Oral QPM   torsemide  40 mg Oral BID   zinc sulfate  220 mg Oral Daily   acetaminophen, docusate sodium, polyethylene glycol  Assessment/ Plan:    [name] is a 88 y.o. 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 Started on IV levothyroxine and now on levothyroxine 25mg  daily. Also on stress dose steroids.   1. Acute kidney injury on Chronic kidney disease stage V: baseline creatinine of 3.9 on 09/04/21. Patient with chronic kidney disease secondary to hypertension and vascular disease. Acute kidney injury secondary to acute exacerbation of chronic systolic congestive heart failure: Family was interested in peritoneal dialysis prior to admission. Needed cardiology clearance before proceeding with PD catheter placement, possibly not a dialysis candidate, due to weakened cardiac state.Palliative care to continue goals fo care discussion with family and patient Lab Results  Component Value Date   CREATININE 3.54 (H) 10/13/2021   CREATININE 3.49 (H) 10/12/2021   CREATININE 3.54 (H) 10/11/2021  Continue Torsemide 40 mg PO  BID  Creatinine stable, urine output of 1.3L in 24 hours  2. Hypotension: with acute exacerbation of systolic congestive heart failure: echo from 01/31/21 with Ejection Fraction of <20%. Cardiogenic shock. Holding home regimen of losartan and PO torsemide.   - ECHO on 10/03/21 shows EF <20% with global hypokinesis - Continue diuresis with oral torsemide BP 93//60, continue midodrine 10 mg TID  3. Anemia with chronic kidney disease Lab Results  Component Value  Date   HGB 9.0 (L) 10/10/2021  We will continue to monitor  4. Hyponatremia: secondary to chronic kidney disease and congestive heart failure.   Sodium remains 137  5. Secondary Hyperparathyroidism: PTH 110 on 08/31/21. Lab Results  Component Value Date   CALCIUM 8.5 (L) 10/13/2021   CAION 1.12 02/23/2010   PHOS 5.1 (H) 10/09/2021  Calcium and phosphorus within acceptable range We will continue to monitor bone minerals during this admission   LOS: 15 [place] 1/27/20232:16 PM

## 2021-10-14 LAB — BASIC METABOLIC PANEL
Anion gap: 10 (ref 5–15)
BUN: 130 mg/dL — ABNORMAL HIGH (ref 8–23)
CO2: 28 mmol/L (ref 22–32)
Calcium: 8.6 mg/dL — ABNORMAL LOW (ref 8.9–10.3)
Chloride: 98 mmol/L (ref 98–111)
Creatinine, Ser: 3.54 mg/dL — ABNORMAL HIGH (ref 0.61–1.24)
GFR, Estimated: 16 mL/min — ABNORMAL LOW (ref >60)
Glucose, Bld: 94 mg/dL (ref 70–99)
Potassium: 4 mmol/L (ref 3.5–5.1)
Sodium: 136 mmol/L (ref 135–145)

## 2021-10-14 NOTE — Progress Notes (Signed)
Patient seen and examined this morning is alert awake oriented no new complaints not in distress. Overnight blood pressure stable 99-100, afebrile, on 1 L nasal cannula oxygen Labs this morning showed a stable renal function at 3.5.  At this time remains medically stable for discharge to skilled nursing facility, awaiting on placement DC summary completed. Plan of care was discussed with patient who is in agreement for placement social worker trying to figure out SNF.  I had discussed with patient's daughter-in-law at the bedside previously and had also spoken with other family before.

## 2021-10-14 NOTE — Progress Notes (Signed)
[facility]  ROUNDING NOTE   Subjective:   Patient was seen today on second floor Patient offers no new specific physical complaint. Patient main concern in today visit was that [facility] but [facility] to [facility] for his placement/skilled nursing facility   Objective:  Vital signs in last 24 hours:  Temp:  [97.5 F (36.4 C)-98.3 F (36.8 C)] 97.5 F (36.4 C) (01/28 0736) Pulse Rate:  [59-70] 65 (01/28 0736) Resp:  [16-19] 18 (01/28 0736) BP: (92-100)/(60-74) 99/74 (01/28 0736) SpO2:  [97 %-100 %] 100 % (01/28 0736) Weight:  [57.3 kg] 57.3 kg (01/28 0500)  Weight change: -9.605 kg Filed Weights   10/12/21 0438 10/13/21 0420 10/14/21 0500  Weight: 67.3 kg 66.9 kg 57.3 kg    Intake/Output: I/O last 3 completed shifts: In: 480 [P.O.:480] Out: 1100 [Urine:1100]   Intake/Output this shift:  No intake/output data recorded.  Physical Exam: General: In no acute distress  Head: Normocephalic, atraumatic. Moist oral mucosal membranes  Eyes: Anicteric  Lungs:  Respirations even, Clear to auscultation, [place] O2  Heart: Regular rate and rhythm  Abdomen:  Soft, nontender, bowel sounds present  Extremities: trace peripheral edema  Neurologic: Awake, alert, following commands,forgetful  Skin: Bilateral arms with ecchymotic areas   Access: NONE    Basic Metabolic Panel: Recent Labs  Lab 10/09/21 0501 10/10/21 0725 10/11/21 0847 10/12/21 0530 10/13/21 0441 10/14/21 0422  NA 138 138 137 140 137 136  K 4.0 3.6 3.9 3.9 4.0 4.0  CL 104 101 102 102 99 98  CO2 24 25 24 28 27 28   GLUCOSE 93 92 89 97 111* 94  BUN 108* 119* 115* 118* 122* 130*  CREATININE 3.83* 3.63* 3.54* 3.49* 3.54* 3.54*  CALCIUM 8.0* 8.1* 8.2* 8.4* 8.5* 8.6*  PHOS 5.1*  --   --   --   --   --     Liver Function Tests: Recent Labs  Lab 10/09/21 0501  ALBUMIN 3.0*   No results for input(s): LIPASE, AMYLASE in the last 168 hours. No results for input(s): AMMONIA in the  last 168 hours.  CBC: Recent Labs  Lab 10/09/21 0501 10/10/21 0725  WBC 6.6 6.0  NEUTROABS  --  4.1  HGB 8.9* 9.0*  HCT 26.5* 27.2*  MCV 102.7* 104.2*  PLT 120* 114*    Cardiac Enzymes: No results for input(s): CKTOTAL, CKMB, CKMBINDEX, TROPONINI in the last 168 hours.  BNP: Invalid input(s): POCBNP  CBG: No results for input(s): GLUCAP in the last 168 hours.   Microbiology: Results for orders placed or performed during the hospital encounter of 09/28/21  Resp Panel by RT-PCR (Flu A&B, Covid) Nasopharyngeal Swab     Status: None   Collection Time: 09/28/21  8:19 PM   Specimen: Nasopharyngeal Swab; Nasopharyngeal(NP) swabs in vial transport medium  Result Value Ref Range Status   SARS Coronavirus 2 by RT PCR NEGATIVE NEGATIVE Final    Comment: (NOTE) SARS-CoV-2 target nucleic acids are NOT DETECTED.  The SARS-CoV-2 RNA is generally detectable in upper respiratory specimens during the acute phase of infection. The lowest concentration of SARS-CoV-2 viral copies this assay can detect is 138 copies/mL. A negative result does not preclude SARS-Cov-2 infection and should not be used as the sole basis for treatment or other patient management decisions. A negative result may occur with  improper specimen collection/handling, submission of specimen other than nasopharyngeal swab, presence of viral mutation(s) within the areas targeted by  this assay, and inadequate number of viral copies(<138 copies/mL). A negative result must be combined with clinical observations, patient history, and epidemiological information. The expected result is Negative.  Fact Sheet for Patients:  EntrepreneurPulse.com.au  Fact Sheet for Healthcare Providers:  IncredibleEmployment.be  This test is no t yet approved or cleared by the [place] FDA and  has been authorized for detection and/or diagnosis of SARS-CoV-2 by FDA under an Emergency Use  Authorization (EUA). This EUA will remain  in effect (meaning this test can be used) for the duration of the COVID-19 declaration under Section 564(b)(1) of the Act, 21 U.S.C.section 360bbb-3(b)(1), unless the authorization is terminated  or revoked sooner.       Influenza A by PCR NEGATIVE NEGATIVE Final   Influenza B by PCR NEGATIVE NEGATIVE Final    Comment: (NOTE) The Xpert Xpress SARS-CoV-2/FLU/RSV plus assay is intended as an aid in the diagnosis of influenza from Nasopharyngeal swab specimens and should not be used as a sole basis for treatment. Nasal washings and aspirates are unacceptable for Xpert Xpress SARS-CoV-2/FLU/RSV testing.  Fact Sheet for Patients: EntrepreneurPulse.com.au  Fact Sheet for Healthcare Providers: IncredibleEmployment.be  This test is not yet approved or cleared by the [place] FDA and has been authorized for detection and/or diagnosis of SARS-CoV-2 by FDA under an Emergency Use Authorization (EUA). This EUA will remain in effect (meaning this test can be used) for the duration of the COVID-19 declaration under Section 564(b)(1) of the Act, 21 U.S.C. section 360bbb-3(b)(1), unless the authorization is terminated or revoked.  Performed at [facility], [address] [place], [place] [address]   Culture, blood (routine x 2)     Status: None   Collection Time: 09/28/21  9:47 PM   Specimen: BLOOD  Result Value Ref Range Status   Specimen Description BLOOD LEFT FOREARM  Final   Special Requests   Final    BOTTLES DRAWN AEROBIC AND ANAEROBIC Blood Culture adequate volume   Culture   Final    NO GROWTH 5 DAYS Performed at [facility], [address], [place], [place] [address]    Report Status 10/03/2021 FINAL  Final  Culture, blood (routine x 2)     Status: None   Collection Time: 09/28/21  9:47 PM   Specimen: BLOOD  Result Value Ref Range Status   Specimen Description BLOOD LEFT  FOREARM  Final   Special Requests IN PEDIATRIC BOTTLE Blood Culture adequate volume  Final   Culture   Final    NO GROWTH 5 DAYS Performed at [facility], [address], [place], [place] [address]    Report Status 10/03/2021 FINAL  Final  MRSA Next Gen by PCR, Nasal     Status: Abnormal   Collection Time: 09/29/21  2:38 AM   Specimen: Nasal Mucosa; Nasal Swab  Result Value Ref Range Status   MRSA by PCR Next Gen DETECTED (A) NOT DETECTED Final    Comment: RESULT CALLED TO, READ BACK BY AND VERIFIED WITH: Hansel Starling @0353  on 09/29/21 SKL (NOTE) The GeneXpert MRSA Assay (FDA approved for NASAL specimens only), is one component of a comprehensive MRSA colonization surveillance program. It is not intended to diagnose MRSA infection nor to guide or monitor treatment for MRSA infections. Test performance is not FDA approved in patients less than 84 years old. Performed at [facility], [address] [place], [place] [address]     Coagulation Studies: No results for input(s): LABPROT, INR in  the last 72 hours.   Urinalysis: No results for input(s): COLORURINE, LABSPEC, PHURINE, GLUCOSEU, HGBUR, BILIRUBINUR, KETONESUR, PROTEINUR, UROBILINOGEN, NITRITE, LEUKOCYTESUR in the last 72 hours.  Invalid input(s): APPERANCEUR     Imaging: No results found.   Medications:      (feeding supplement) PROSource Plus  30 mL Oral BID BM   vitamin C  500 mg Oral BID   aspirin EC  81 mg Oral Daily   calcitRIOL  0.25 mcg Oral Daily   Chlorhexidine Gluconate Cloth  6 each Topical Q0600   heparin  5,000 Units Subcutaneous Q8H   levothyroxine  75 mcg Oral Q0600   liothyronine  5 mcg Oral Daily   midodrine  10 mg Oral TID WC   multivitamin with minerals  1 tablet Oral Daily   mupirocin ointment   Nasal BID   pravastatin  20 mg Oral QPM   torsemide  40 mg Oral BID   zinc sulfate  220 mg Oral Daily   acetaminophen, docusate sodium, polyethylene glycol  Assessment/  Plan:   [name] is a 88 y.o. 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 Started on IV levothyroxine and now on levothyroxine 25mg  daily. Also on stress dose steroids.   1. Acute kidney injury on Chronic kidney disease stage V:  Pt has baseline creatinine of 3.9 on 09/04/21.  Patient with chronic kidney disease secondary to hypertension and vascular disease.  Acute kidney injury secondary to acute exacerbation of chronic systolic congestive heart failure:  Family was interested in peritoneal dialysis prior to admission. Needed cardiology clearance before proceeding with PD catheter placement, possibly not a dialysis candidate, due to weakened cardiac state. Palliative care to continue goals fo care discussion with family and patient Lab Results  Component Value Date   CREATININE 3.54 (H) 10/14/2021   CREATININE 3.54 (H) 10/13/2021   CREATININE 3.49 (H) 10/12/2021  Continue Torsemide 40 mg PO  BID  Creatinine stable.  2. Hypotension: with acute exacerbation of systolic congestive heart failure: echo from 01/31/21 with Ejection Fraction of <20%. Cardiogenic shock. Holding home regimen of losartan and PO torsemide.   - ECHO on 10/03/21 shows EF <20% with global hypokinesis - Continue diuresis with oral torsemide - Continue midodrine 10 mg TID  3. Anemia with chronic kidney disease Lab Results  Component Value Date   HGB 9.0 (L)  10/10/2021  We will continue to monitor  4. Hyponatremia: secondary to chronic kidney disease and congestive heart failure.  Sodium is now better  5. Secondary Hyperparathyroidism: PTH 110 on 08/31/21. Lab Results  Component Value Date   CALCIUM 8.6 (L) 10/14/2021   CAION 1.12 02/23/2010   PHOS 5.1 (H) 10/09/2021  Calcium and phosphorus within acceptable range We will continue to monitor bone minerals during this admission   LOS: 16 Saran Laviolette s [facility] 1/28/20239:09 AM

## 2021-10-15 MED ORDER — TORSEMIDE 40 MG PO TABS
40.0000 mg | ORAL_TABLET | Freq: Every day | ORAL | Status: AC
Start: 2021-10-16 — End: ?

## 2021-10-15 MED ORDER — TORSEMIDE 20 MG PO TABS
40.0000 mg | ORAL_TABLET | Freq: Every day | ORAL | Status: DC
  Administered 2021-10-16: 40 mg via ORAL
  Filled 2021-10-15: qty 2

## 2021-10-15 NOTE — Progress Notes (Signed)
[facility]  ROUNDING NOTE   Subjective:   Patient was seen today on second floor Patient offers no new specific physical complaint.    Objective:  Vital signs in last 24 hours:  Temp:  [97.4 F (36.3 C)-98.1 F (36.7 C)] 97.5 F (36.4 C) (01/29 0813) Pulse Rate:  [64-66] 66 (01/29 0813) Resp:  [15-18] 15 (01/29 0813) BP: (88-100)/(59-77) 88/62 (01/29 0813) SpO2:  [100 %] 100 % (01/29 0813) Weight:  [58.5 kg] 58.5 kg (01/29 0500)  Weight change: 1.165 kg Filed Weights   10/13/21 0420 10/14/21 0500 10/15/21 0500  Weight: 66.9 kg 57.3 kg 58.5 kg    Intake/Output: I/O last 3 completed shifts: In: 600 [P.O.:600] Out: 1400 [Urine:1400]   Intake/Output this shift:  Total I/O In: -  Out: 550 [Urine:550]  Physical Exam: General: In no acute distress  Head: Normocephalic, atraumatic. Moist oral mucosal membranes  Eyes: Anicteric  Lungs:  Respirations even, Clear to auscultation, [place] O2  Heart: Regular rate and rhythm  Abdomen:  Soft, nontender, bowel sounds present  Extremities: trace peripheral edema  Neurologic: Awake, alert, following commands,forgetful  Skin: Bilateral arms with ecchymotic areas   Access: NONE    Basic Metabolic Panel: Recent Labs  Lab 10/09/21 0501 10/10/21 0725 10/11/21 0847 10/12/21 0530 10/13/21 0441 10/14/21 0422  NA 138 138 137 140 137 136  K 4.0 3.6 3.9 3.9 4.0 4.0  CL 104 101 102 102 99 98  CO2 24 25 24 28 27 28   GLUCOSE 93 92 89 97 111* 94  BUN 108* 119* 115* 118* 122* 130*  CREATININE 3.83* 3.63* 3.54* 3.49* 3.54* 3.54*  CALCIUM 8.0* 8.1* 8.2* 8.4* 8.5* 8.6*  PHOS 5.1*  --   --   --   --   --     Liver Function Tests: Recent Labs  Lab 10/09/21 0501  ALBUMIN 3.0*   No results for input(s): LIPASE, AMYLASE in the last 168 hours. No results for input(s): AMMONIA in the last 168 hours.  CBC: Recent Labs  Lab 10/09/21 0501 10/10/21 0725  WBC 6.6 6.0  NEUTROABS  --  4.1  HGB 8.9* 9.0*  HCT 26.5* 27.2*   MCV 102.7* 104.2*  PLT 120* 114*    Cardiac Enzymes: No results for input(s): CKTOTAL, CKMB, CKMBINDEX, TROPONINI in the last 168 hours.  BNP: Invalid input(s): POCBNP  CBG: No results for input(s): GLUCAP in the last 168 hours.   Microbiology: Results for orders placed or performed during the hospital encounter of 09/28/21  Resp Panel by RT-PCR (Flu A&B, Covid) Nasopharyngeal Swab     Status: None   Collection Time: 09/28/21  8:19 PM   Specimen: Nasopharyngeal Swab; Nasopharyngeal(NP) swabs in vial transport medium  Result Value Ref Range Status   SARS Coronavirus 2 by RT PCR NEGATIVE NEGATIVE Final    Comment: (NOTE) SARS-CoV-2 target nucleic acids are NOT DETECTED.  The SARS-CoV-2 RNA is generally detectable in upper respiratory specimens during the acute phase of infection. The lowest concentration of SARS-CoV-2 viral copies this assay can detect is 138 copies/mL. A negative result does not preclude SARS-Cov-2 infection and should not be used as the sole basis for treatment or other patient management decisions. A negative result may occur with  improper specimen collection/handling, submission of specimen other than nasopharyngeal swab, presence of viral mutation(s) within the areas targeted by this assay, and inadequate number of viral copies(<138 copies/mL). A negative result must be combined with clinical observations, patient history, and epidemiological information.  The expected result is Negative.  Fact Sheet for Patients:  EntrepreneurPulse.com.au  Fact Sheet for Healthcare Providers:  IncredibleEmployment.be  This test is no t yet approved or cleared by the [place] FDA and  has been authorized for detection and/or diagnosis of SARS-CoV-2 by FDA under an Emergency Use Authorization (EUA). This EUA will remain  in effect (meaning this test can be used) for the duration of the COVID-19 declaration under Section  564(b)(1) of the Act, 21 U.S.C.section 360bbb-3(b)(1), unless the authorization is terminated  or revoked sooner.       Influenza A by PCR NEGATIVE NEGATIVE Final   Influenza B by PCR NEGATIVE NEGATIVE Final    Comment: (NOTE) The Xpert Xpress SARS-CoV-2/FLU/RSV plus assay is intended as an aid in the diagnosis of influenza from Nasopharyngeal swab specimens and should not be used as a sole basis for treatment. Nasal washings and aspirates are unacceptable for Xpert Xpress SARS-CoV-2/FLU/RSV testing.  Fact Sheet for Patients: EntrepreneurPulse.com.au  Fact Sheet for Healthcare Providers: IncredibleEmployment.be  This test is not yet approved or cleared by the [place] FDA and has been authorized for detection and/or diagnosis of SARS-CoV-2 by FDA under an Emergency Use Authorization (EUA). This EUA will remain in effect (meaning this test can be used) for the duration of the COVID-19 declaration under Section 564(b)(1) of the Act, 21 U.S.C. section 360bbb-3(b)(1), unless the authorization is terminated or revoked.  Performed at [facility], [address] [place], [place] [address]   Culture, blood (routine x 2)     Status: None   Collection Time: 09/28/21  9:47 PM   Specimen: BLOOD  Result Value Ref Range Status   Specimen Description BLOOD LEFT FOREARM  Final   Special Requests   Final    BOTTLES DRAWN AEROBIC AND ANAEROBIC Blood Culture adequate volume   Culture   Final    NO GROWTH 5 DAYS Performed at [facility], [address], [place], [place] [address]    Report Status 10/03/2021 FINAL  Final  Culture, blood (routine x 2)     Status: None   Collection Time: 09/28/21  9:47 PM   Specimen: BLOOD  Result Value Ref Range Status   Specimen Description BLOOD LEFT FOREARM  Final   Special Requests IN PEDIATRIC BOTTLE Blood Culture adequate volume  Final   Culture   Final    NO GROWTH 5 DAYS Performed at  [name], [address], [place], [place] [address]    Report Status 10/03/2021 FINAL  Final  MRSA Next Gen by PCR, Nasal     Status: Abnormal   Collection Time: 09/29/21  2:38 AM   Specimen: Nasal Mucosa; Nasal Swab  Result Value Ref Range Status   MRSA by PCR Next Gen DETECTED (A) NOT DETECTED Final    Comment: RESULT CALLED TO, READ BACK BY AND VERIFIED WITH: Hansel Starling @0353  on 09/29/21 SKL (NOTE) The GeneXpert MRSA Assay (FDA approved for NASAL specimens only), is one component of a comprehensive MRSA colonization surveillance program. It is not intended to diagnose MRSA infection nor to guide or monitor treatment for MRSA infections. Test performance is not FDA approved in patients less than 60 years old. Performed at [facility], [address] [place], [place] [address]     Coagulation Studies: No results for input(s): LABPROT, INR in the last 72 hours.   Urinalysis: No results for input(s): COLORURINE, LABSPEC, PHURINE, GLUCOSEU, HGBUR, BILIRUBINUR, KETONESUR, PROTEINUR, UROBILINOGEN, NITRITE, LEUKOCYTESUR in  the last 72 hours.  Invalid input(s): APPERANCEUR     Imaging: No results found.   Medications:      (feeding supplement) PROSource Plus  30 mL Oral BID BM   vitamin C  500 mg Oral BID   aspirin EC  81 mg Oral Daily   calcitRIOL  0.25 mcg Oral Daily   Chlorhexidine Gluconate Cloth  6 each Topical Q0600   heparin  5,000 Units Subcutaneous Q8H   levothyroxine  75 mcg Oral Q0600   liothyronine  5 mcg Oral Daily   midodrine  10 mg Oral TID WC   multivitamin with minerals  1 tablet Oral Daily   mupirocin ointment   Nasal BID   pravastatin  20 mg Oral QPM   torsemide  40 mg Oral BID   zinc sulfate  220 mg Oral Daily   acetaminophen, docusate sodium, polyethylene glycol  Assessment/ Plan:   [name] is a 88 y.o. 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 Started on IV levothyroxine and now on levothyroxine 25mg  daily. Also on stress dose steroids.   1. Acute kidney injury on Chronic kidney disease stage V:  Pt has baseline creatinine of 3.9 on 09/04/21.  Patient with chronic kidney disease secondary to hypertension and vascular disease.  Acute kidney injury secondary to acute exacerbation of chronic systolic congestive heart failure:  Family was interested in peritoneal dialysis prior to admission. Needed cardiology clearance before proceeding with PD catheter placement, possibly not a dialysis candidate, due to weakened cardiac state. Palliative care to continue goals fo care discussion with family and patient Lab Results  Component Value Date   CREATININE 3.54 (H) 10/14/2021   CREATININE 3.54 (H) 10/13/2021   CREATININE 3.49 (H) 10/12/2021  Continue Torsemide 40 mg PO  BID  Creatinine stable.  2. Hypotension: with acute exacerbation of systolic congestive heart failure: echo from 01/31/21 with Ejection Fraction of <20%. Cardiogenic shock. Holding home regimen of losartan and PO torsemide.   - ECHO on 10/03/21 shows EF <20% with global hypokinesis - Patient remained hypotensive so I reduced patient torsemide from 40 mg p.o. twice daily to 40 mg p.o. daily - Continue midodrine 10 mg TID  3. Anemia with chronic kidney disease Lab Results  Component Value Date   HGB 9.0 (L) 10/10/2021  We will continue to monitor  4. Hyponatremia: secondary  to chronic kidney disease and congestive heart failure.  Sodium is now better  5. Secondary Hyperparathyroidism: PTH 110 on 08/31/21. Lab Results  Component Value Date   CALCIUM 8.6 (L) 10/14/2021   CAION 1.12 02/23/2010   PHOS 5.1 (H) 10/09/2021  Calcium and phosphorus within acceptable range We will continue to monitor bone minerals during this admission   LOS: 17 Yesenia Fontenette s [facility] 1/29/20238:24 AM

## 2021-10-15 NOTE — Progress Notes (Signed)
PROGRESS NOTE    [name]  UEA:[id number] DOB: 16-Sep-1931 DOA: 09/28/2021 PCP: Baxter Hire, MD   Chief Complaint  Patient presents with   Bradycardia   unresponsive  Brief Narrative/Hospital Course: 88 y.o. male with PMH of  with CKD V, baseline 3.9, sCHF EF <20% due to ischemic CM, AS and CAD s/p CABG and AVR 2011, pAF not on [facility],  and PVD who presented to the ED with unresponsiveness.  Patient had gone to Pulmonary clinic for evaluation of dyspnea, fatigue, was told to go to the ER  Reportedly, family took patient to eat and then home, where he became unresponsive, EMS activated.  EMS found in a chair at home with HR 20s and BP was 80/60, hypothermic, started pacing, started Levophed and transported. In the ER he was lethargic, somnolent and getting externally paced.  ECG showed junctional bradycardia. Hypothermic, hypotensive, CXR with bilateral effusions and edema.  Cr 4.69 BNP: 2137, TSH: 213.  He was started on levothyroxine, hydrocortisone and Levophed and admitted to ICU, Cardiology and Nephrology consulted .  At this time patient overall stable and improving strength but deconditioned frail.  He is tolerating diuretics, PT OT has recommended skilled nursing facility, seen by palliative care.He does have overall guarded prognosis in the setting of complex cardiac conditions, renal dysfunction. At this time plan is for him to go to skilled nursing facility based upon patient's preferences, and if medically stable for discharge once bed available    Subjective: Seen and examined this morning. Patient is alert awake no new complaints.  Finished his meal this morning Overnight blood pressure at times low 88-100-torsemide held Remains on 1.5 L nasal cannula. Wt at 128 lb, on admission was up to 171  Assessment & Plan:  Myxedema coma:Presented with unresponsiveness severe bradycardia requiring external pacing, hypothermia.  TSH was 213 Free T4 less than 0.2 S/P IV Synthroid, IV  hydrocortisone repeat TSH/free T4 has improved to 3 still low.  Continue with current levothyroxine and liothyronine. Repeat TST/FT4/FT3 in 2-3 wks and adjust the dose  Acute on chronic systolic CHF Cardiogenic shock: Dilated cardiomyopathy: Volume status overall is stable with significant improvement in weight.  Blood pressure remains soft at times to continue on midodrine.  Cont on torsemide 40 BID as per nephrology/cardiology,holding losartan due to blood pressure being soft/renal dysfunction.  Cardiology has signed off.  Monitor intake output Daily weight while here . Weight on admit 171 lb> 158> 148.5> 148.3? 128Net IO Since Admission: -13,631.12 mL [10/15/21 1105]  Filed Weights   10/13/21 0420 10/14/21 0500 10/15/21 0500  Weight: 66.9 kg 57.3 kg 58.5 kg   Hypotension:BP soft, continue midodrine.  Discussed with nephrology.  Holding torsemide and changing to once a day from 40 twice daily.  Relatively asymptomatic   MR w/ dilated cardiomyopathy: Not amenable to MitraClip due to DCM.  Seen by cardiology.  Follow-up with cardiology for further recommendation  CAD with CABG x5 in 2011 HLD AAA 3.2 cm in 2020: Echo showed EF less than 20%.  Cardiology encouraged goals of care discussion given his overall guarded prognosis.Continue aspirin and statin and rest of the home medication.  AKI on CKD stage IV: Baseline creatinine around 3.9 followed by nephrology, tolerating torsemide-dose being adjusted due to hypotension,  creat stable at 3.5 BUN in 120s to 130s with a stable mental status.  Overall poor prognosis not a candidate for dialysis follow-up with nephrology as outpatient.  Recent Labs  Lab 10/10/21 0725 10/11/21 0847 10/12/21  0530 10/13/21 0441 10/14/21 0422  BUN 119* 115* 118* 122* 130*  CREATININE 3.63* 3.54* 3.49* 3.54* 3.54*   Hyponatremia:Stable  Acute respiratory failure with hypoxia: Due to patient's CHF.Cont supplemental oxygen to maintain saturation at least 93% or  above  PAF: Rate controlled.not on anticoagulation at this time due to patient's preference, amiodarone on hold in the setting of thyroid dysfunction  Demand ischemia: In the setting of CHF. Anemia in other chronic diseases -stable hemoglobin, 2/2 CKD and hypothyroidism.  Monitor  Recent Labs  Lab 10/09/21 0501 10/10/21 0725  HGB 8.9* 9.0*  HCT 26.5* 27.2*    Thrombocytopenia: Due to acute illness.  Stable Recent Labs  Lab 10/09/21 0501 10/10/21 0725  PLT 120* 114*   Goals of care Debility Deconditioning: Overall prognosis does not appear bright, prognosis is guarded given complex comorbidities severe systolic dysfunction with CKD.  Palliative care following, MOST form completed 1/24, currently is DNR.  Patient wants to pursue skilled nursing facility at this time and not hospice.   Pressure injury  POA see below Pressure Injury 09/29/21 Buttocks Right;Mid Stage 2 -  Partial thickness loss of dermis presenting as a shallow open injury with a red, pink wound bed without slough. (Active)  09/29/21 0230  Location: Buttocks  Location Orientation: Right;Mid  Staging: Stage 2 -  Partial thickness loss of dermis presenting as a shallow open injury with a red, pink wound bed without slough.  Wound Description (Comments):   Present on Admission: Yes     Pressure Injury 09/29/21 Buttocks Left;Mid Stage 2 -  Partial thickness loss of dermis presenting as a shallow open injury with a red, pink wound bed without slough. (Active)  09/29/21 0230  Location: Buttocks  Location Orientation: Left;Mid  Staging: Stage 2 -  Partial thickness loss of dermis presenting as a shallow open injury with a red, pink wound bed without slough.  Wound Description (Comments):   Present on Admission: Yes  DVT prophylaxis: heparin injection 5,000 Units Start: 09/29/21 0000 Code Status:   Code Status: DNR Family Communication: plan of care discussed with patient .I had updated patient's family previously.   Patient is in agreement with plan of care for discharge to skilled nursing facility once bed available.  Status OZ:[id number]. Remains inpatient appropriate because: for ongoing deconditioning, Disposition:Currently medically stable for discharge. Anticipated Disposition:SNF for short term rehab  Total time spent in the care of this patient 35 MINUTES Objective: Vitals last 24 hrs: Vitals:   10/15/21 0813 10/15/21 0908 10/15/21 0912 10/15/21 0950  BP: (!) 88/62 (!) 87/58 (!) 93/59 96/68  Pulse: 66 67 68   Resp: 15     Temp: (!) 97.5 F (36.4 C)     TempSrc:      SpO2: 100% 96% 97%   Weight:      Height:       Weight change: 1.165 kg  Intake/Output Summary (Last 24 hours) at 10/15/2021 1105 Last data filed at 10/15/2021 1059 Gross per 24 hour  Intake 660 ml  Output 850 ml  Net -190 ml  Net IO Since Admission: -13,631.12 mL [10/15/21 1105]   Physical Examination: General exam: AAOx 3, pleasant, not in distress older than stated age, weak appearing. HEENT:Oral mucosa moist, Ear/Nose WNL grossly, dentition normal. Respiratory system: bilaterally clear, no use of accessory muscle Cardiovascular system: S1 & S2 +, No JVD,. Gastrointestinal system: Abdomen soft, NT,ND, BS+ Nervous System:Alert, awake, moving extremities and grossly nonfocal Extremities: no edema, distal peripheral pulses palpable.  Skin:  No rashes,no icterus. MSK: Normal muscle bulk,tone, power   Medications reviewed:  Scheduled Meds:  (feeding supplement) PROSource Plus  30 mL Oral BID BM   vitamin C  500 mg Oral BID   aspirin EC  81 mg Oral Daily   calcitRIOL  0.25 mcg Oral Daily   Chlorhexidine Gluconate Cloth  6 each Topical Q0600   heparin  5,000 Units Subcutaneous Q8H   levothyroxine  75 mcg Oral Q0600   liothyronine  5 mcg Oral Daily   midodrine  10 mg Oral TID WC   multivitamin with minerals  1 tablet Oral Daily   mupirocin ointment   Nasal BID   pravastatin  20 mg Oral QPM   [START ON  10/16/2021] torsemide  40 mg Oral Daily   zinc sulfate  220 mg Oral Daily   Continuous Infusions:  Diet Order             Diet 2 gram sodium Room service appropriate? Yes; Fluid consistency: Thin  Diet effective now                   Nutrition Problem: Increased nutrient needs Etiology: chronic illness (CHF) Signs/Symptoms: estimated needs Interventions: Ensure Enlive (each supplement provides 350kcal and 20 grams of protein), MVI, Liberalize Diet  Weight change: 1.165 kg  Wt Readings from Last 3 Encounters:  10/15/21 58.5 kg  08/21/21 69.4 kg  08/03/21 64.9 kg     Consultants:see note  Procedures:see note Antimicrobials: Anti-infectives (From admission, onward)    Start     Dose/Rate Route Frequency Ordered Stop   09/29/21 0738  vancomycin variable dose per unstable renal function (pharmacist dosing)  Status:  Discontinued         Does not apply See admin instructions 09/29/21 0738 09/29/21 1004   09/29/21 0600  vancomycin (VANCOREADY) IVPB 1750 mg/350 mL        1,750 mg 175 mL/hr over 120 Minutes Intravenous  Once 09/29/21 0455 09/29/21 0822   09/29/21 0545  ceFAZolin (ANCEF) IVPB 1 g/50 mL premix  Status:  Discontinued        1 g 100 mL/hr over 30 Minutes Intravenous Every 12 hours 09/29/21 0454 09/29/21 1004      Culture/Microbiology    Component Value Date/Time   SDES BLOOD LEFT FOREARM 09/28/2021 2147   SDES BLOOD LEFT FOREARM 09/28/2021 2147   SPECREQUEST  09/28/2021 2147    BOTTLES DRAWN AEROBIC AND ANAEROBIC Blood Culture adequate volume   SPECREQUEST IN PEDIATRIC BOTTLE Blood Culture adequate volume 09/28/2021 2147   CULT  09/28/2021 2147    NO GROWTH 5 DAYS Performed at [facility], [address] [place], [place] [address]    CULT  09/28/2021 2147    NO GROWTH 5 DAYS Performed at [facility], [address] [place], [place] [address]    REPTSTATUS 10/03/2021 FINAL 09/28/2021 2147   REPTSTATUS 10/03/2021 FINAL 09/28/2021  2147    Other culture-see note  Unresulted Labs (From admission, onward)    None     Data Reviewed: I have personally reviewed following labs and imaging studies CBC: Recent Labs  Lab 10/09/21 0501 10/10/21 0725  WBC 6.6 6.0  NEUTROABS  --  4.1  HGB 8.9* 9.0*  HCT 26.5* 27.2*  MCV 102.7* 104.2*  PLT 120* 174*   Basic Metabolic Panel: Recent Labs  Lab 10/09/21 0501 10/10/21 0725 10/11/21 0847 10/12/21 0530 10/13/21 0441 10/14/21 0422  NA 138 138 137 140 137 136  K  4.0 3.6 3.9 3.9 4.0 4.0  CL 104 101 102 102 99 98  CO2 24 25 24 28 27 28   GLUCOSE 93 92 89 97 111* 94  BUN 108* 119* 115* 118* 122* 130*  CREATININE 3.83* 3.63* 3.54* 3.49* 3.54* 3.54*  CALCIUM 8.0* 8.1* 8.2* 8.4* 8.5* 8.6*  PHOS 5.1*  --   --   --   --   --    GFR: Estimated Creatinine Clearance: 11.9 mL/min (A) (by C-G formula based on SCr of 3.54 mg/dL (H)). Liver Function Tests: Recent Labs  Lab 10/09/21 0501  ALBUMIN 3.0*   No results for input(s): LIPASE, AMYLASE in the last 168 hours. No results for input(s): AMMONIA in the last 168 hours. Coagulation Profile: No results for input(s): INR, PROTIME in the last 168 hours. Cardiac Enzymes: No results for input(s): CKTOTAL, CKMB, CKMBINDEX, TROPONINI in the last 168 hours. BNP (last 3 results) No results for input(s): PROBNP in the last 8760 hours. HbA1C: No results for input(s): HGBA1C in the last 72 hours. CBG: No results for input(s): GLUCAP in the last 168 hours.  Lipid Profile: No results for input(s): CHOL, HDL, LDLCALC, TRIG, CHOLHDL, LDLDIRECT in the last 72 hours. Thyroid Function Tests: No results for input(s): TSH, T4TOTAL, FREET4, T3FREE, THYROIDAB in the last 72 hours. Anemia Panel: No results for input(s): VITAMINB12, FOLATE, FERRITIN, TIBC, IRON, RETICCTPCT in the last 72 hours. Sepsis Labs: No results for input(s): PROCALCITON, LATICACIDVEN in the last 168 hours.  No results found for this or any previous visit (from  the past 240 hour(s)).   Radiology Studies: No results found.   LOS: 17 days   Antonieta Pert, MD [facility]  10/15/2021, 11:05 AM

## 2021-10-16 DIAGNOSIS — I132 Hypertensive heart and chronic kidney disease with heart failure and with stage 5 chronic kidney disease, or end stage renal disease: Secondary | ICD-10-CM | POA: Diagnosis not present

## 2021-10-16 DIAGNOSIS — D696 Thrombocytopenia, unspecified: Secondary | ICD-10-CM | POA: Diagnosis not present

## 2021-10-16 DIAGNOSIS — J9601 Acute respiratory failure with hypoxia: Secondary | ICD-10-CM | POA: Diagnosis not present

## 2021-10-16 DIAGNOSIS — R609 Edema, unspecified: Secondary | ICD-10-CM | POA: Diagnosis not present

## 2021-10-16 DIAGNOSIS — Z955 Presence of coronary angioplasty implant and graft: Secondary | ICD-10-CM | POA: Diagnosis not present

## 2021-10-16 DIAGNOSIS — Z7982 Long term (current) use of aspirin: Secondary | ICD-10-CM | POA: Diagnosis not present

## 2021-10-16 DIAGNOSIS — E871 Hypo-osmolality and hyponatremia: Secondary | ICD-10-CM | POA: Diagnosis not present

## 2021-10-16 DIAGNOSIS — N179 Acute kidney failure, unspecified: Secondary | ICD-10-CM | POA: Diagnosis not present

## 2021-10-16 DIAGNOSIS — J90 Pleural effusion, not elsewhere classified: Secondary | ICD-10-CM | POA: Diagnosis not present

## 2021-10-16 DIAGNOSIS — Z515 Encounter for palliative care: Secondary | ICD-10-CM | POA: Diagnosis not present

## 2021-10-16 DIAGNOSIS — I714 Abdominal aortic aneurysm, without rupture, unspecified: Secondary | ICD-10-CM | POA: Diagnosis not present

## 2021-10-16 DIAGNOSIS — L89302 Pressure ulcer of unspecified buttock, stage 2: Secondary | ICD-10-CM | POA: Diagnosis not present

## 2021-10-16 DIAGNOSIS — Z66 Do not resuscitate: Secondary | ICD-10-CM | POA: Diagnosis not present

## 2021-10-16 DIAGNOSIS — L259 Unspecified contact dermatitis, unspecified cause: Secondary | ICD-10-CM | POA: Diagnosis not present

## 2021-10-16 DIAGNOSIS — N390 Urinary tract infection, site not specified: Secondary | ICD-10-CM | POA: Diagnosis not present

## 2021-10-16 DIAGNOSIS — R718 Other abnormality of red blood cells: Secondary | ICD-10-CM | POA: Diagnosis not present

## 2021-10-16 DIAGNOSIS — D472 Monoclonal gammopathy: Secondary | ICD-10-CM | POA: Diagnosis not present

## 2021-10-16 DIAGNOSIS — D638 Anemia in other chronic diseases classified elsewhere: Secondary | ICD-10-CM | POA: Diagnosis not present

## 2021-10-16 DIAGNOSIS — Z7189 Other specified counseling: Secondary | ICD-10-CM | POA: Diagnosis not present

## 2021-10-16 DIAGNOSIS — D631 Anemia in chronic kidney disease: Secondary | ICD-10-CM | POA: Diagnosis not present

## 2021-10-16 DIAGNOSIS — J9611 Chronic respiratory failure with hypoxia: Secondary | ICD-10-CM | POA: Diagnosis not present

## 2021-10-16 DIAGNOSIS — R5381 Other malaise: Secondary | ICD-10-CM | POA: Diagnosis not present

## 2021-10-16 DIAGNOSIS — E7800 Pure hypercholesterolemia, unspecified: Secondary | ICD-10-CM | POA: Diagnosis not present

## 2021-10-16 DIAGNOSIS — L89140 Pressure ulcer of left lower back, unstageable: Secondary | ICD-10-CM | POA: Diagnosis not present

## 2021-10-16 DIAGNOSIS — Z20822 Contact with and (suspected) exposure to covid-19: Secondary | ICD-10-CM | POA: Diagnosis not present

## 2021-10-16 DIAGNOSIS — N3000 Acute cystitis without hematuria: Secondary | ICD-10-CM | POA: Diagnosis not present

## 2021-10-16 DIAGNOSIS — N185 Chronic kidney disease, stage 5: Secondary | ICD-10-CM | POA: Diagnosis not present

## 2021-10-16 DIAGNOSIS — I739 Peripheral vascular disease, unspecified: Secondary | ICD-10-CM | POA: Diagnosis not present

## 2021-10-16 DIAGNOSIS — L89312 Pressure ulcer of right buttock, stage 2: Secondary | ICD-10-CM | POA: Diagnosis not present

## 2021-10-16 DIAGNOSIS — I5023 Acute on chronic systolic (congestive) heart failure: Secondary | ICD-10-CM | POA: Diagnosis not present

## 2021-10-16 DIAGNOSIS — J810 Acute pulmonary edema: Secondary | ICD-10-CM | POA: Diagnosis not present

## 2021-10-16 DIAGNOSIS — I959 Hypotension, unspecified: Secondary | ICD-10-CM | POA: Diagnosis not present

## 2021-10-16 DIAGNOSIS — L89322 Pressure ulcer of left buttock, stage 2: Secondary | ICD-10-CM | POA: Diagnosis not present

## 2021-10-16 DIAGNOSIS — R600 Localized edema: Secondary | ICD-10-CM | POA: Diagnosis present

## 2021-10-16 DIAGNOSIS — I471 Supraventricular tachycardia: Secondary | ICD-10-CM | POA: Diagnosis not present

## 2021-10-16 DIAGNOSIS — J189 Pneumonia, unspecified organism: Secondary | ICD-10-CM | POA: Diagnosis not present

## 2021-10-16 DIAGNOSIS — Z9981 Dependence on supplemental oxygen: Secondary | ICD-10-CM | POA: Diagnosis not present

## 2021-10-16 DIAGNOSIS — N184 Chronic kidney disease, stage 4 (severe): Secondary | ICD-10-CM | POA: Diagnosis not present

## 2021-10-16 DIAGNOSIS — I10 Essential (primary) hypertension: Secondary | ICD-10-CM | POA: Diagnosis not present

## 2021-10-16 DIAGNOSIS — E038 Other specified hypothyroidism: Secondary | ICD-10-CM | POA: Diagnosis not present

## 2021-10-16 DIAGNOSIS — E035 Myxedema coma: Secondary | ICD-10-CM | POA: Diagnosis not present

## 2021-10-16 DIAGNOSIS — E860 Dehydration: Secondary | ICD-10-CM | POA: Diagnosis not present

## 2021-10-16 DIAGNOSIS — I480 Paroxysmal atrial fibrillation: Secondary | ICD-10-CM | POA: Diagnosis not present

## 2021-10-16 DIAGNOSIS — I2510 Atherosclerotic heart disease of native coronary artery without angina pectoris: Secondary | ICD-10-CM | POA: Diagnosis not present

## 2021-10-16 DIAGNOSIS — I071 Rheumatic tricuspid insufficiency: Secondary | ICD-10-CM | POA: Diagnosis not present

## 2021-10-16 DIAGNOSIS — K219 Gastro-esophageal reflux disease without esophagitis: Secondary | ICD-10-CM | POA: Diagnosis not present

## 2021-10-16 DIAGNOSIS — N189 Chronic kidney disease, unspecified: Secondary | ICD-10-CM | POA: Diagnosis not present

## 2021-10-16 DIAGNOSIS — I517 Cardiomegaly: Secondary | ICD-10-CM | POA: Diagnosis not present

## 2021-10-16 DIAGNOSIS — L89152 Pressure ulcer of sacral region, stage 2: Secondary | ICD-10-CM | POA: Diagnosis not present

## 2021-10-16 DIAGNOSIS — M1990 Unspecified osteoarthritis, unspecified site: Secondary | ICD-10-CM | POA: Diagnosis not present

## 2021-10-16 DIAGNOSIS — E039 Hypothyroidism, unspecified: Secondary | ICD-10-CM | POA: Diagnosis not present

## 2021-10-16 DIAGNOSIS — I719 Aortic aneurysm of unspecified site, without rupture: Secondary | ICD-10-CM | POA: Diagnosis not present

## 2021-10-16 DIAGNOSIS — E875 Hyperkalemia: Secondary | ICD-10-CM | POA: Diagnosis not present

## 2021-10-16 DIAGNOSIS — I350 Nonrheumatic aortic (valve) stenosis: Secondary | ICD-10-CM | POA: Diagnosis not present

## 2021-10-16 DIAGNOSIS — Z7401 Bed confinement status: Secondary | ICD-10-CM | POA: Diagnosis not present

## 2021-10-16 DIAGNOSIS — E440 Moderate protein-calorie malnutrition: Secondary | ICD-10-CM | POA: Diagnosis not present

## 2021-10-16 DIAGNOSIS — R531 Weakness: Secondary | ICD-10-CM | POA: Diagnosis not present

## 2021-10-16 DIAGNOSIS — N401 Enlarged prostate with lower urinary tract symptoms: Secondary | ICD-10-CM | POA: Diagnosis not present

## 2021-10-16 DIAGNOSIS — R0602 Shortness of breath: Secondary | ICD-10-CM | POA: Diagnosis not present

## 2021-10-16 LAB — RESP PANEL BY RT-PCR (FLU A&B, COVID) ARPGX2
Influenza A by PCR: NEGATIVE
Influenza B by PCR: NEGATIVE
SARS Coronavirus 2 by RT PCR: NEGATIVE

## 2021-10-16 NOTE — Progress Notes (Signed)
Seen and examined this morning He is alert awake oriented.  No acute events overnight. Blood pressure stable.  Tolerating diuretics which was decreased to torsemide 40 once a day. continue midodrine.   No change in plan of care, discharge summary updated and anticipate discharge to skilled [facility] today 10/16/21 Patient is in agreement with plan of care

## 2021-10-16 NOTE — Care Management Important Message (Signed)
Important Message  Patient Details  Name: [name] MRN: [id number] Date of Birth: 22-Jun-1931   Medicare Important Message Given:  Yes     Dannette Barbara 10/16/2021, 11:40 AM

## 2021-10-16 NOTE — TOC Transition Note (Signed)
Transition of Care [facility]) - CM/SW Discharge Note   Patient Details  Name: [name] MRN: [id number] Date of Birth: 04/12/1931  [facility] CM/SW Contact:  Alberteen Sam, LCSW Phone Number: 10/16/2021, 12:40 PM   Clinical Narrative:     Patient will DC to: [facility] date: 10/16/21 Family notified: Pam Transport by: Johnanna Schneiders  Per MD patient ready for DC to [facility] . RN, patient, patient's family, and facility notified of DC. Discharge Summary sent to facility. RN given number for report   [phone]. DC packet on chart. Ambulance transport requested for patient.  CSW signing off.  [place], [place]    Final next level of care: Skilled Nursing Facility Barriers to Discharge: No Barriers Identified   Patient Goals and CMS Choice Patient states their goals for this hospitalization and ongoing recovery are:: to go home CMS Medicare.gov Compare Post Acute Care list provided to:: Patient Represenative (must comment) (family) Choice offered to / list presented to : Adult Children  Discharge Placement              Patient chooses bed at: [facility] Patient to be transferred to facility by: [facility] Name of family member notified: Pam Patient and family notified of of transfer: 10/16/21  Discharge Plan and Services                                     Social Determinants of Health (SDOH) Interventions     Readmission Risk Interventions Readmission Risk Prevention Plan 05/31/2021  Transportation Screening Complete  PCP or Specialist Appt within 3-5 Days Complete  Social Work Consult for [place] Planning/Counseling Complete  Palliative Care Screening Not Applicable  Medication Review Press photographer) Complete  Some recent data might be hidden

## 2021-10-16 NOTE — Progress Notes (Signed)
[facility]  ROUNDING NOTE   Subjective:   Patient sitting up in chair comfortably this AM. Patient to transition to [facility]. Urine output 1.2 L over the preceding 24 hours.  Objective:  Vital signs in last 24 hours:  Temp:  [97.7 F (36.5 C)-98.4 F (36.9 C)] 97.8 F (36.6 C) (01/30 1305) Pulse Rate:  [64-66] 66 (01/30 1305) Resp:  [18-20] 20 (01/30 1305) BP: (86-98)/(55-68) 98/67 (01/30 1305) SpO2:  [93 %-98 %] 98 % (01/30 1305) Weight:  [67.3 kg] 67.3 kg (01/29 2130)  Weight change: 8.8 kg Filed Weights   10/14/21 0500 10/15/21 0500 10/15/21 2130  Weight: 57.3 kg 58.5 kg 67.3 kg    Intake/Output: I/O last 3 completed shifts: In: 660 [P.O.:660] Out: 1550 [Urine:1550]   Intake/Output this shift:  Total I/O In: 600 [P.O.:600] Out: 400 [Urine:400]  Physical Exam: General: No acute distress  Head: Normocephalic, atraumatic. Moist oral mucosal membranes  Eyes: Anicteric  Lungs:  Clear bilateral, normal effort  Heart: regular  Abdomen:  Soft, nontender, bowel sounds present  Extremities: No peripheral edema  Neurologic: Awake, alert, following commands,fogetful  Skin: Bilateral arms with ecchymoses  Access: NONE    Basic Metabolic Panel: Recent Labs  Lab 10/10/21 0725 10/11/21 0847 10/12/21 0530 10/13/21 0441 10/14/21 0422  NA 138 137 140 137 136  K 3.6 3.9 3.9 4.0 4.0  CL 101 102 102 99 98  CO2 25 24 28 27 28   GLUCOSE 92 89 97 111* 94  BUN 119* 115* 118* 122* 130*  CREATININE 3.63* 3.54* 3.49* 3.54* 3.54*  CALCIUM 8.1* 8.2* 8.4* 8.5* 8.6*     Liver Function Tests: No results for input(s): AST, ALT, ALKPHOS, BILITOT, PROT, ALBUMIN in the last 168 hours.  No results for input(s): LIPASE, AMYLASE in the last 168 hours. No results for input(s): AMMONIA in the last 168 hours.  CBC: Recent Labs  Lab 10/10/21 0725  WBC 6.0  NEUTROABS 4.1  HGB 9.0*  HCT 27.2*  MCV 104.2*  PLT 114*     Cardiac  Enzymes: No results for input(s): CKTOTAL, CKMB, CKMBINDEX, TROPONINI in the last 168 hours.  BNP: Invalid input(s): POCBNP  CBG: No results for input(s): GLUCAP in the last 168 hours.   Microbiology: Results for orders placed or performed during the hospital encounter of 09/28/21  Resp Panel by RT-PCR (Flu A&B, Covid) Nasopharyngeal Swab     Status: None   Collection Time: 09/28/21  8:19 PM   Specimen: Nasopharyngeal Swab; Nasopharyngeal(NP) swabs in vial transport medium  Result Value Ref Range Status   SARS Coronavirus 2 by RT PCR NEGATIVE NEGATIVE Final    Comment: (NOTE) SARS-CoV-2 target nucleic acids are NOT DETECTED.  The SARS-CoV-2 RNA is generally detectable in upper respiratory specimens during the acute phase of infection. The lowest concentration of SARS-CoV-2 viral copies this assay can detect is 138 copies/mL. A negative result does not preclude SARS-Cov-2 infection and should not be used as the sole basis for treatment or other patient management decisions. A negative result may occur with  improper specimen collection/handling, submission of specimen other than nasopharyngeal swab, presence of viral mutation(s) within the areas targeted by this assay, and inadequate number of viral copies(<138 copies/mL). A negative result must be combined with clinical observations, patient history, and epidemiological information. The expected result is Negative.  Fact Sheet for Patients:  EntrepreneurPulse.com.au  Fact Sheet for Healthcare Providers:  IncredibleEmployment.be  This test is no t yet approved or cleared by  the [employer] and  has been authorized for detection and/or diagnosis of SARS-CoV-2 by FDA under an Emergency Use Authorization (EUA). This EUA will remain  in effect (meaning this test can be used) for the duration of the COVID-19 declaration under Section 564(b)(1) of the Act, 21 U.S.C.section 360bbb-3(b)(1),  unless the authorization is terminated  or revoked sooner.       Influenza A by PCR NEGATIVE NEGATIVE Final   Influenza B by PCR NEGATIVE NEGATIVE Final    Comment: (NOTE) The Xpert Xpress SARS-CoV-2/FLU/RSV plus assay is intended as an aid in the diagnosis of influenza from Nasopharyngeal swab specimens and should not be used as a sole basis for treatment. Nasal washings and aspirates are unacceptable for Xpert Xpress SARS-CoV-2/FLU/RSV testing.  Fact Sheet for Patients: EntrepreneurPulse.com.au  Fact Sheet for Healthcare Providers: IncredibleEmployment.be  This test is not yet approved or cleared by the [place] FDA and has been authorized for detection and/or diagnosis of SARS-CoV-2 by FDA under an Emergency Use Authorization (EUA). This EUA will remain in effect (meaning this test can be used) for the duration of the COVID-19 declaration under Section 564(b)(1) of the Act, 21 U.S.C. section 360bbb-3(b)(1), unless the authorization is terminated or revoked.  Performed at [name], [address] [place], [place] [address]   Culture, blood (routine x 2)     Status: None   Collection Time: 09/28/21  9:47 PM   Specimen: BLOOD  Result Value Ref Range Status   Specimen Description BLOOD LEFT FOREARM  Final   Special Requests   Final    BOTTLES DRAWN AEROBIC AND ANAEROBIC Blood Culture adequate volume   Culture   Final    NO GROWTH 5 DAYS Performed at [facility], [address] [place], [place] [address]    Report Status 10/03/2021 FINAL  Final  Culture, blood (routine x 2)     Status: None   Collection Time: 09/28/21  9:47 PM   Specimen: BLOOD  Result Value Ref Range Status   Specimen Description BLOOD LEFT FOREARM  Final   Special Requests IN PEDIATRIC BOTTLE Blood Culture adequate volume  Final   Culture   Final    NO GROWTH 5 DAYS Performed at [facility], [address]  [place], [place] [address]    Report Status 10/03/2021 FINAL  Final  MRSA Next Gen by PCR, Nasal     Status: Abnormal   Collection Time: 09/29/21  2:38 AM   Specimen: Nasal Mucosa; Nasal Swab  Result Value Ref Range Status   MRSA by PCR Next Gen DETECTED (A) NOT DETECTED Final    Comment: RESULT CALLED TO, READ BACK BY AND VERIFIED WITH: Hansel Starling @0353  on 09/29/21 SKL (NOTE) The GeneXpert MRSA Assay (FDA approved for NASAL specimens only), is one component of a comprehensive MRSA colonization surveillance program. It is not intended to diagnose MRSA infection nor to guide or monitor treatment for MRSA infections. Test performance is not FDA approved in patients less than 72 years old. Performed at [facility], [address] [place],  [address]   Resp Panel by RT-PCR (Flu A&B, Covid) Nasopharyngeal Swab     Status: None   Collection Time: 10/16/21 10:15 AM   Specimen: Nasopharyngeal Swab; Nasopharyngeal(NP) swabs in vial transport medium  Result Value Ref Range Status   SARS Coronavirus 2 by RT PCR NEGATIVE NEGATIVE Final    Comment: (NOTE) SARS-CoV-2 target nucleic acids are NOT DETECTED.  The SARS-CoV-2  RNA is generally detectable in upper respiratory specimens during the acute phase of infection. The lowest concentration of SARS-CoV-2 viral copies this assay can detect is 138 copies/mL. A negative result does not preclude SARS-Cov-2 infection and should not be used as the sole basis for treatment or other patient management decisions. A negative result may occur with  improper specimen collection/handling, submission of specimen other than nasopharyngeal swab, presence of viral mutation(s) within the areas targeted by this assay, and inadequate number of viral copies(<138 copies/mL). A negative result must be combined with clinical observations, patient history, and epidemiological information. The expected result is Negative.  Fact Sheet for Patients:   EntrepreneurPulse.com.au  Fact Sheet for Healthcare Providers:  IncredibleEmployment.be  This test is no t yet approved or cleared by the [place] FDA and  has been authorized for detection and/or diagnosis of SARS-CoV-2 by FDA under an Emergency Use Authorization (EUA). This EUA will remain  in effect (meaning this test can be used) for the duration of the COVID-19 declaration under Section 564(b)(1) of the Act, 21 U.S.C.section 360bbb-3(b)(1), unless the authorization is terminated  or revoked sooner.       Influenza A by PCR NEGATIVE NEGATIVE Final   Influenza B by PCR NEGATIVE NEGATIVE Final    Comment: (NOTE) The Xpert Xpress SARS-CoV-2/FLU/RSV plus assay is intended as an aid in the diagnosis of influenza from Nasopharyngeal swab specimens and should not be used as a sole basis for treatment. Nasal washings and aspirates are unacceptable for Xpert Xpress SARS-CoV-2/FLU/RSV testing.  Fact Sheet for Patients: EntrepreneurPulse.com.au  Fact Sheet for Healthcare Providers: IncredibleEmployment.be  This test is not yet approved or cleared by the [place] FDA and has been authorized for detection and/or diagnosis of SARS-CoV-2 by FDA under an Emergency Use Authorization (EUA). This EUA will remain in effect (meaning this test can be used) for the duration of the COVID-19 declaration under Section 564(b)(1) of the Act, 21 U.S.C. section 360bbb-3(b)(1), unless the authorization is terminated or revoked.  Performed at [facility], [address] [place], [place] [address]     Coagulation Studies: No results for input(s): LABPROT, INR in the last 72 hours.   Urinalysis: No results for input(s): COLORURINE, LABSPEC, PHURINE, GLUCOSEU, HGBUR, BILIRUBINUR, KETONESUR, PROTEINUR, UROBILINOGEN, NITRITE, LEUKOCYTESUR in the last 72 hours.  Invalid input(s): APPERANCEUR      Imaging: No results found.   Medications:      (feeding supplement) PROSource Plus  30 mL Oral BID BM   vitamin C  500 mg Oral BID   aspirin EC  81 mg Oral Daily   calcitRIOL  0.25 mcg Oral Daily   Chlorhexidine Gluconate Cloth  6 each Topical Q0600   heparin  5,000 Units Subcutaneous Q8H   levothyroxine  75 mcg Oral Q0600   liothyronine  5 mcg Oral Daily   midodrine  10 mg Oral TID WC   multivitamin with minerals  1 tablet Oral Daily   mupirocin ointment   Nasal BID   pravastatin  20 mg Oral QPM   torsemide  40 mg Oral Daily   zinc sulfate  220 mg Oral Daily   acetaminophen, docusate sodium, polyethylene glycol  Assessment/ Plan:   [name] is a 88 y.o. 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 Started on IV levothyroxine and now on levothyroxine 25mg  daily. Also on stress dose steroids.   1. Acute kidney injury on Chronic kidney disease stage V: baseline creatinine of 3.9 on 09/04/21. Patient with chronic kidney disease secondary to hypertension and vascular disease. Acute kidney injury secondary to acute exacerbation of chronic systolic congestive heart failure: Family was interested in peritoneal dialysis prior to admission. Needed cardiology clearance before proceeding with PD catheter placement, possibly not a dialysis candidate, due to weakened cardiac state.Palliative care to continue goals fo care  discussion with family and patient Lab Results  Component Value Date   CREATININE 3.54 (H) 10/14/2021   CREATININE 3.54 (H) 10/13/2021   CREATININE 3.49 (H) 10/12/2021  Most recent creatinine was 3.5.  No immediate need for dialysis.  Okay to continue torsemide.  He will need close outpatient follow-up.  2. Hypotension: with acute exacerbation of systolic congestive heart failure: echo from 01/31/21 with Ejection Fraction of <20%. Cardiogenic shock. Holding home regimen of losartan and PO torsemide.   - ECHO on 10/03/21 shows EF <20% with global hypokinesis -Continue midodrine  3. Anemia with chronic kidney disease Lab Results  Component Value Date   HGB 9.0 (L) 10/10/2021  We will continue to monitor, consider hematology evaluation as outpatient.  4. Hyponatremia: secondary to chronic kidney disease and congestive heart failure.   Continue to monitor serum sodium as an outpatient.  5. Secondary Hyperparathyroidism: PTH 110 on 08/31/21. Lab Results  Component Value Date   CALCIUM 8.6 (L) 10/14/2021   CAION 1.12 02/23/2010   PHOS 5.1 (H) 10/09/2021    Calcium and phosphorus are at acceptable range.   LOS: 18 Quanika Solem 1/30/20232:52 PM

## 2021-10-16 NOTE — Progress Notes (Signed)
Report called to Bud Face at [facility].

## 2021-10-17 DIAGNOSIS — N184 Chronic kidney disease, stage 4 (severe): Secondary | ICD-10-CM | POA: Diagnosis not present

## 2021-10-17 DIAGNOSIS — I5023 Acute on chronic systolic (congestive) heart failure: Secondary | ICD-10-CM | POA: Diagnosis not present

## 2021-10-17 DIAGNOSIS — E039 Hypothyroidism, unspecified: Secondary | ICD-10-CM | POA: Diagnosis not present

## 2021-10-17 DIAGNOSIS — E440 Moderate protein-calorie malnutrition: Secondary | ICD-10-CM | POA: Diagnosis not present

## 2021-10-17 DIAGNOSIS — J9611 Chronic respiratory failure with hypoxia: Secondary | ICD-10-CM | POA: Diagnosis not present

## 2021-10-18 DIAGNOSIS — L259 Unspecified contact dermatitis, unspecified cause: Secondary | ICD-10-CM | POA: Diagnosis not present

## 2021-10-18 DIAGNOSIS — L89140 Pressure ulcer of left lower back, unstageable: Secondary | ICD-10-CM | POA: Diagnosis not present

## 2021-10-24 DIAGNOSIS — I5023 Acute on chronic systolic (congestive) heart failure: Secondary | ICD-10-CM | POA: Diagnosis not present

## 2021-10-24 DIAGNOSIS — N185 Chronic kidney disease, stage 5: Secondary | ICD-10-CM | POA: Diagnosis not present

## 2021-10-24 DIAGNOSIS — I480 Paroxysmal atrial fibrillation: Secondary | ICD-10-CM | POA: Diagnosis not present

## 2021-10-25 ENCOUNTER — Emergency Department: Payer: PPO

## 2021-10-25 ENCOUNTER — Other Ambulatory Visit: Payer: Self-pay

## 2021-10-25 ENCOUNTER — Inpatient Hospital Stay
Admission: EM | Admit: 2021-10-25 | Discharge: 2021-11-09 | DRG: 291 | Disposition: A | Discharge status: Hospice | Payer: PPO | Source: Skilled Nursing Facility | Attending: Internal Medicine | Admitting: Internal Medicine

## 2021-10-25 DIAGNOSIS — R718 Other abnormality of red blood cells: Secondary | ICD-10-CM

## 2021-10-25 DIAGNOSIS — E875 Hyperkalemia: Secondary | ICD-10-CM | POA: Diagnosis not present

## 2021-10-25 DIAGNOSIS — E039 Hypothyroidism, unspecified: Secondary | ICD-10-CM

## 2021-10-25 DIAGNOSIS — I071 Rheumatic tricuspid insufficiency: Secondary | ICD-10-CM | POA: Diagnosis not present

## 2021-10-25 DIAGNOSIS — Z953 Presence of xenogenic heart valve: Secondary | ICD-10-CM

## 2021-10-25 DIAGNOSIS — M1990 Unspecified osteoarthritis, unspecified site: Secondary | ICD-10-CM | POA: Diagnosis not present

## 2021-10-25 DIAGNOSIS — N3000 Acute cystitis without hematuria: Secondary | ICD-10-CM | POA: Diagnosis not present

## 2021-10-25 DIAGNOSIS — K219 Gastro-esophageal reflux disease without esophagitis: Secondary | ICD-10-CM | POA: Diagnosis not present

## 2021-10-25 DIAGNOSIS — L89302 Pressure ulcer of unspecified buttock, stage 2: Secondary | ICD-10-CM | POA: Diagnosis present

## 2021-10-25 DIAGNOSIS — I132 Hypertensive heart and chronic kidney disease with heart failure and with stage 5 chronic kidney disease, or end stage renal disease: Secondary | ICD-10-CM | POA: Diagnosis not present

## 2021-10-25 DIAGNOSIS — Z7189 Other specified counseling: Secondary | ICD-10-CM | POA: Diagnosis not present

## 2021-10-25 DIAGNOSIS — L89152 Pressure ulcer of sacral region, stage 2: Secondary | ICD-10-CM | POA: Diagnosis not present

## 2021-10-25 DIAGNOSIS — J90 Pleural effusion, not elsewhere classified: Secondary | ICD-10-CM | POA: Diagnosis not present

## 2021-10-25 DIAGNOSIS — D696 Thrombocytopenia, unspecified: Secondary | ICD-10-CM | POA: Diagnosis not present

## 2021-10-25 DIAGNOSIS — N19 Unspecified kidney failure: Secondary | ICD-10-CM

## 2021-10-25 DIAGNOSIS — I480 Paroxysmal atrial fibrillation: Secondary | ICD-10-CM | POA: Diagnosis not present

## 2021-10-25 DIAGNOSIS — I5023 Acute on chronic systolic (congestive) heart failure: Secondary | ICD-10-CM | POA: Diagnosis not present

## 2021-10-25 DIAGNOSIS — N185 Chronic kidney disease, stage 5: Secondary | ICD-10-CM | POA: Diagnosis not present

## 2021-10-25 DIAGNOSIS — Z79890 Hormone replacement therapy (postmenopausal): Secondary | ICD-10-CM

## 2021-10-25 DIAGNOSIS — E7800 Pure hypercholesterolemia, unspecified: Secondary | ICD-10-CM | POA: Diagnosis present

## 2021-10-25 DIAGNOSIS — I10 Essential (primary) hypertension: Secondary | ICD-10-CM | POA: Diagnosis not present

## 2021-10-25 DIAGNOSIS — J189 Pneumonia, unspecified organism: Secondary | ICD-10-CM | POA: Diagnosis not present

## 2021-10-25 DIAGNOSIS — D649 Anemia, unspecified: Secondary | ICD-10-CM | POA: Diagnosis present

## 2021-10-25 DIAGNOSIS — L89322 Pressure ulcer of left buttock, stage 2: Secondary | ICD-10-CM | POA: Diagnosis not present

## 2021-10-25 DIAGNOSIS — I509 Heart failure, unspecified: Secondary | ICD-10-CM

## 2021-10-25 DIAGNOSIS — Z515 Encounter for palliative care: Secondary | ICD-10-CM

## 2021-10-25 DIAGNOSIS — I959 Hypotension, unspecified: Secondary | ICD-10-CM | POA: Diagnosis not present

## 2021-10-25 DIAGNOSIS — D472 Monoclonal gammopathy: Secondary | ICD-10-CM | POA: Diagnosis present

## 2021-10-25 DIAGNOSIS — E440 Moderate protein-calorie malnutrition: Secondary | ICD-10-CM | POA: Insufficient documentation

## 2021-10-25 DIAGNOSIS — I2510 Atherosclerotic heart disease of native coronary artery without angina pectoris: Secondary | ICD-10-CM | POA: Diagnosis present

## 2021-10-25 DIAGNOSIS — E871 Hypo-osmolality and hyponatremia: Secondary | ICD-10-CM | POA: Diagnosis not present

## 2021-10-25 DIAGNOSIS — I739 Peripheral vascular disease, unspecified: Secondary | ICD-10-CM | POA: Diagnosis present

## 2021-10-25 DIAGNOSIS — Z66 Do not resuscitate: Secondary | ICD-10-CM | POA: Diagnosis not present

## 2021-10-25 DIAGNOSIS — R600 Localized edema: Secondary | ICD-10-CM | POA: Diagnosis not present

## 2021-10-25 DIAGNOSIS — L89312 Pressure ulcer of right buttock, stage 2: Secondary | ICD-10-CM | POA: Diagnosis not present

## 2021-10-25 DIAGNOSIS — Z20822 Contact with and (suspected) exposure to covid-19: Secondary | ICD-10-CM | POA: Diagnosis present

## 2021-10-25 DIAGNOSIS — N179 Acute kidney failure, unspecified: Secondary | ICD-10-CM | POA: Diagnosis present

## 2021-10-25 DIAGNOSIS — J810 Acute pulmonary edema: Secondary | ICD-10-CM

## 2021-10-25 DIAGNOSIS — E038 Other specified hypothyroidism: Secondary | ICD-10-CM | POA: Diagnosis not present

## 2021-10-25 DIAGNOSIS — N390 Urinary tract infection, site not specified: Secondary | ICD-10-CM

## 2021-10-25 DIAGNOSIS — R0602 Shortness of breath: Secondary | ICD-10-CM | POA: Diagnosis not present

## 2021-10-25 DIAGNOSIS — N184 Chronic kidney disease, stage 4 (severe): Secondary | ICD-10-CM | POA: Diagnosis not present

## 2021-10-25 DIAGNOSIS — N189 Chronic kidney disease, unspecified: Secondary | ICD-10-CM | POA: Diagnosis not present

## 2021-10-25 DIAGNOSIS — D631 Anemia in chronic kidney disease: Secondary | ICD-10-CM | POA: Diagnosis present

## 2021-10-25 DIAGNOSIS — I719 Aortic aneurysm of unspecified site, without rupture: Secondary | ICD-10-CM | POA: Diagnosis not present

## 2021-10-25 DIAGNOSIS — I5022 Chronic systolic (congestive) heart failure: Secondary | ICD-10-CM | POA: Diagnosis not present

## 2021-10-25 DIAGNOSIS — Z888 Allergy status to other drugs, medicaments and biological substances status: Secondary | ICD-10-CM

## 2021-10-25 DIAGNOSIS — Z885 Allergy status to narcotic agent status: Secondary | ICD-10-CM

## 2021-10-25 DIAGNOSIS — D638 Anemia in other chronic diseases classified elsewhere: Secondary | ICD-10-CM | POA: Diagnosis not present

## 2021-10-25 DIAGNOSIS — Z91041 Radiographic dye allergy status: Secondary | ICD-10-CM

## 2021-10-25 DIAGNOSIS — Z96653 Presence of artificial knee joint, bilateral: Secondary | ICD-10-CM | POA: Diagnosis present

## 2021-10-25 DIAGNOSIS — Z8249 Family history of ischemic heart disease and other diseases of the circulatory system: Secondary | ICD-10-CM

## 2021-10-25 DIAGNOSIS — Z951 Presence of aortocoronary bypass graft: Secondary | ICD-10-CM

## 2021-10-25 DIAGNOSIS — I517 Cardiomegaly: Secondary | ICD-10-CM | POA: Diagnosis not present

## 2021-10-25 DIAGNOSIS — E860 Dehydration: Secondary | ICD-10-CM | POA: Diagnosis not present

## 2021-10-25 DIAGNOSIS — R609 Edema, unspecified: Secondary | ICD-10-CM | POA: Diagnosis not present

## 2021-10-25 DIAGNOSIS — E43 Unspecified severe protein-calorie malnutrition: Secondary | ICD-10-CM | POA: Insufficient documentation

## 2021-10-25 DIAGNOSIS — L89140 Pressure ulcer of left lower back, unstageable: Secondary | ICD-10-CM | POA: Diagnosis not present

## 2021-10-25 DIAGNOSIS — Z87891 Personal history of nicotine dependence: Secondary | ICD-10-CM

## 2021-10-25 DIAGNOSIS — Z7982 Long term (current) use of aspirin: Secondary | ICD-10-CM

## 2021-10-25 DIAGNOSIS — Z79899 Other long term (current) drug therapy: Secondary | ICD-10-CM

## 2021-10-25 LAB — COMPREHENSIVE METABOLIC PANEL
ALT: 61 U/L — ABNORMAL HIGH (ref 0–44)
AST: 42 U/L — ABNORMAL HIGH (ref 15–41)
Albumin: 3.4 g/dL — ABNORMAL LOW (ref 3.5–5.0)
Alkaline Phosphatase: 119 U/L (ref 38–126)
Anion gap: 12 (ref 5–15)
BUN: 157 mg/dL — ABNORMAL HIGH (ref 8–23)
CO2: 22 mmol/L (ref 22–32)
Calcium: 8.5 mg/dL — ABNORMAL LOW (ref 8.9–10.3)
Chloride: 99 mmol/L (ref 98–111)
Creatinine, Ser: 4.44 mg/dL — ABNORMAL HIGH (ref 0.61–1.24)
GFR, Estimated: 12 mL/min — ABNORMAL LOW (ref >60)
Glucose, Bld: 103 mg/dL — ABNORMAL HIGH (ref 70–99)
Potassium: 4.5 mmol/L (ref 3.5–5.1)
Sodium: 133 mmol/L — ABNORMAL LOW (ref 135–145)
Total Bilirubin: 0.9 mg/dL (ref 0.3–1.2)
Total Protein: 7 g/dL (ref 6.5–8.1)

## 2021-10-25 LAB — CBC WITH DIFFERENTIAL/PLATELET
Abs Immature Granulocytes: 0.05 10*3/uL (ref 0.00–0.07)
Basophils Absolute: 0 10*3/uL (ref 0.0–0.1)
Basophils Relative: 0 %
Eosinophils Absolute: 0.7 10*3/uL — ABNORMAL HIGH (ref 0.0–0.5)
Eosinophils Relative: 14 %
HCT: 27.4 % — ABNORMAL LOW (ref 39.0–52.0)
Hemoglobin: 8.7 g/dL — ABNORMAL LOW (ref 13.0–17.0)
Immature Granulocytes: 1 %
Lymphocytes Relative: 10 %
Lymphs Abs: 0.5 10*3/uL — ABNORMAL LOW (ref 0.7–4.0)
MCH: 35.4 pg — ABNORMAL HIGH (ref 26.0–34.0)
MCHC: 31.8 g/dL (ref 30.0–36.0)
MCV: 111.4 fL — ABNORMAL HIGH (ref 80.0–100.0)
Monocytes Absolute: 0.6 10*3/uL (ref 0.1–1.0)
Monocytes Relative: 11 %
Neutro Abs: 3.5 10*3/uL (ref 1.7–7.7)
Neutrophils Relative %: 64 %
Platelets: 171 10*3/uL (ref 150–400)
RBC: 2.46 MIL/uL — ABNORMAL LOW (ref 4.22–5.81)
RDW: 18.4 % — ABNORMAL HIGH (ref 11.5–15.5)
Smear Review: NORMAL
WBC: 5.3 10*3/uL (ref 4.0–10.5)
nRBC: 0.4 % — ABNORMAL HIGH (ref 0.0–0.2)

## 2021-10-25 LAB — URINALYSIS, ROUTINE W REFLEX MICROSCOPIC
Bilirubin Urine: NEGATIVE
Glucose, UA: NEGATIVE mg/dL
Ketones, ur: NEGATIVE mg/dL
Nitrite: NEGATIVE
Protein, ur: 30 mg/dL — AB
Specific Gravity, Urine: 1.01 (ref 1.005–1.030)
Squamous Epithelial / HPF: NONE SEEN (ref 0–5)
WBC, UA: >50 WBC/hpf — ABNORMAL HIGH (ref 0–5)
pH: 5 (ref 5.0–8.0)

## 2021-10-25 MED ORDER — FUROSEMIDE 10 MG/ML IJ SOLN
20.0000 mg | Freq: Once | INTRAMUSCULAR | Status: AC
  Administered 2021-10-25: 20 mg via INTRAVENOUS
  Filled 2021-10-25: qty 4

## 2021-10-25 MED ORDER — SODIUM CHLORIDE 0.9 % IV SOLN
1.0000 g | Freq: Once | INTRAVENOUS | Status: AC
  Administered 2021-10-26: 1 g via INTRAVENOUS
  Filled 2021-10-25: qty 10

## 2021-10-25 MED ORDER — SODIUM CHLORIDE 0.9 % IV SOLN
500.0000 mg | Freq: Once | INTRAVENOUS | Status: AC
  Administered 2021-10-25: 500 mg via INTRAVENOUS
  Filled 2021-10-25: qty 5

## 2021-10-25 NOTE — ED Provider Notes (Signed)
[facility] Provider Note   Event Date/Time   First MD Initiated Contact with Patient 10/25/21 2053     (approximate)  History   Dehydration  HPI [name] is a 88 y.o. male with a past medical history of CAD, chronic kidney disease, CHF who presents for dehydration from a rehab facility.  Patient's family at bedside and provides most of this history stating that patient was on IV fluids when she arrived today and when asked why they stated that he was dehydrated.  Family noted that patient was having significant edema to the face, abdomen, and not having any urine output despite the IV fluids and requested patient be sent to the emergency department.   Physical Exam   Triage Vital Signs: ED Triage Vitals  Enc Vitals Group     BP 10/25/21 2028 101/66     Pulse Rate 10/25/21 2028 67     Resp 10/25/21 2028 20     Temp 10/25/21 2215 98.4 F (36.9 C)     Temp Source 10/25/21 2215 Oral     SpO2 10/25/21 2027 95 %     Weight 10/25/21 2029 155 lb (70.3 kg)     Height 10/25/21 2029 6' (1.829 m)     Head Circumference --      Peak Flow --      Pain Score 10/25/21 2215 0     Pain Loc --      Pain Edu? --      Excl. in [place]? --     Most recent vital signs: Vitals:   10/25/21 2300 10/25/21 2349  BP: 94/66   Pulse: 60   Resp: 12   Temp:  (!) 97.3 F (36.3 C)  SpO2: 100%     General: Awake, no distress. CV:  Good peripheral perfusion.  Resp:  Normal effort.  Abd:  No distention.  Other:  Patient laying in bed with GCS 14 and nasal cannula in place.  No significant edema appreciated to face or abdomen   ED Results / Procedures / Treatments   Labs (all labs ordered are listed, but only abnormal results are displayed) Labs Reviewed  COMPREHENSIVE METABOLIC PANEL - Abnormal; Notable for the following components:      Result Value   Sodium 133 (*)    Glucose, Bld 103 (*)    BUN 157 (*)    Creatinine, Ser 4.44 (*)    Calcium 8.5 (*)     Albumin 3.4 (*)    AST 42 (*)    ALT 61 (*)    GFR, Estimated 12 (*)    All other components within normal limits  CBC WITH DIFFERENTIAL/PLATELET - Abnormal; Notable for the following components:   RBC 2.46 (*)    Hemoglobin 8.7 (*)    HCT 27.4 (*)    MCV 111.4 (*)    MCH 35.4 (*)    RDW 18.4 (*)    nRBC 0.4 (*)    Lymphs Abs 0.5 (*)    Eosinophils Absolute 0.7 (*)    All other components within normal limits  URINALYSIS, ROUTINE W REFLEX MICROSCOPIC - Abnormal; Notable for the following components:   Color, Urine YELLOW (*)    APPearance CLOUDY (*)    Hgb urine dipstick MODERATE (*)    Protein, ur 30 (*)    Leukocytes,Ua LARGE (*)    WBC, UA >50 (*)    Bacteria, UA MANY (*)    All other components within normal  limits  CULTURE, BLOOD (ROUTINE X 2)  CULTURE, BLOOD (ROUTINE X 2)  RESP PANEL BY RT-PCR (FLU A&B, COVID) ARPGX2  BRAIN NATRIURETIC PEPTIDE  PATHOLOGIST SMEAR REVIEW  TROPONIN I (HIGH SENSITIVITY)  TROPONIN I (HIGH SENSITIVITY)   RADIOLOGY  ED MD interpretation: Single view portable chest x-ray shows new peripheral airspace disease in the left midlung and increasing airspace disease at the left base suspicious for acute infectious or inflammatory process as well as cardiomegaly with vascular congestion and probable low-grade edema as well as bilateral pleural effusions  Official radiology report(s): DG Chest Port 1 View  Result Date: 10/25/2021 CLINICAL DATA:  Shortness of breath EXAM: PORTABLE CHEST 1 VIEW COMPARISON:  10/07/2021, CT 05/19/2021, radiograph 09/30/2021, 07/28/2021, 02/07/2021 FINDINGS: Post sternotomy changes. Valve prosthesis. Cardiomegaly with vascular congestion and mild interstitial edema. Small loculated right pleural effusion. New ground-glass peripheral opacity in the left mid lung. Aortic atherosclerosis. No pneumothorax. Slight increased airspace disease at left base. IMPRESSION: 1. New peripheral airspace disease in the left mid lung with  increasing airspace disease at left base suspicious for acute infectious or inflammatory process 2. Cardiomegaly with vascular congestion and probable low-grade edema. There are bilateral effusions which may be slightly loculated on the right Electronically Signed   By: Donavan Foil M.D.   On: 10/25/2021 22:53      PROCEDURES:  Critical Care performed: No  .1-3 Lead EKG Interpretation Performed by: Naaman Plummer, MD Authorized by: Naaman Plummer, MD     Interpretation: normal     ECG rate:  61   ECG rate assessment: normal     Rhythm: sinus rhythm     Ectopy: none     Conduction: normal     MEDICATIONS ORDERED IN ED: Medications  cefTRIAXone (ROCEPHIN) 1 g in sodium chloride 0.9 % 100 mL IVPB (has no administration in time range)  azithromycin (ZITHROMAX) 500 mg in sodium chloride 0.9 % 250 mL IVPB (500 mg Intravenous New Bag/Given 10/25/21 2348)  furosemide (LASIX) injection 20 mg (20 mg Intravenous Given 10/25/21 2350)     IMPRESSION / MDM / ASSESSMENT AND PLAN / ED COURSE  I reviewed the triage vital signs and the nursing notes.                              Differential diagnosis includes, but is not limited to, volume overload, acute renal failure, pulmonary edema, urinary tract infection, sepsis  The patient is on the cardiac monitor to evaluate for evidence of arrhythmia and/or significant heart rate changes.  Patient is an 88 year old male with the above-stated past medical history who presents for signs of volume overload as well as concern for possible urinary retention.  Laboratory evaluation significant for stable anemia with hemoglobin of 8.7.  CMP concerning for hyponatremia of 133 as well as worsening renal function from 3.5-4.4 and UA showing signs of urinary tract infection.  Given these constellation of findings, patient will require admission to the internal medicine service for further evaluation and management.  Prior to admission, patient did receive empiric  antibiotics of Rocephin and azithromycin as well as 20 mg of IV Lasix.  Dispo: Admit to medicine      FINAL CLINICAL IMPRESSION(S) / ED DIAGNOSES   Final diagnoses:  Urinary tract infection without hematuria, site unspecified  Acute pulmonary edema ([place])     Rx / DC Orders   ED Discharge Orders     None  Note:  This document was prepared using Dragon voice recognition software and may include unintentional dictation errors.   Naaman Plummer, MD 10/26/21 [phone]

## 2021-10-25 NOTE — ED Notes (Signed)
Pt reports he was seen at the doctor yesterday where they did blood work and today told him he was dehyrdrated. Patient given IV fluids at facility, and states he has not been able to urinate all day, even though he feels like he needs to.

## 2021-10-26 ENCOUNTER — Encounter: Payer: Self-pay | Admitting: Family Medicine

## 2021-10-26 DIAGNOSIS — R718 Other abnormality of red blood cells: Secondary | ICD-10-CM

## 2021-10-26 DIAGNOSIS — N3000 Acute cystitis without hematuria: Secondary | ICD-10-CM

## 2021-10-26 DIAGNOSIS — Z7189 Other specified counseling: Secondary | ICD-10-CM | POA: Diagnosis not present

## 2021-10-26 DIAGNOSIS — E039 Hypothyroidism, unspecified: Secondary | ICD-10-CM

## 2021-10-26 LAB — TROPONIN I (HIGH SENSITIVITY)
Troponin I (High Sensitivity): 60 ng/L — ABNORMAL HIGH (ref <18)
Troponin I (High Sensitivity): 61 ng/L — ABNORMAL HIGH (ref <18)

## 2021-10-26 LAB — CBC
HCT: 26.1 % — ABNORMAL LOW (ref 39.0–52.0)
Hemoglobin: 8.5 g/dL — ABNORMAL LOW (ref 13.0–17.0)
MCH: 35.3 pg — ABNORMAL HIGH (ref 26.0–34.0)
MCHC: 32.6 g/dL (ref 30.0–36.0)
MCV: 108.3 fL — ABNORMAL HIGH (ref 80.0–100.0)
Platelets: 157 10*3/uL (ref 150–400)
RBC: 2.41 MIL/uL — ABNORMAL LOW (ref 4.22–5.81)
RDW: 18.6 % — ABNORMAL HIGH (ref 11.5–15.5)
WBC: 4.9 10*3/uL (ref 4.0–10.5)
nRBC: 0.4 % — ABNORMAL HIGH (ref 0.0–0.2)

## 2021-10-26 LAB — BASIC METABOLIC PANEL
Anion gap: 15 (ref 5–15)
BUN: 157 mg/dL — ABNORMAL HIGH (ref 8–23)
CO2: 21 mmol/L — ABNORMAL LOW (ref 22–32)
Calcium: 8.4 mg/dL — ABNORMAL LOW (ref 8.9–10.3)
Chloride: 100 mmol/L (ref 98–111)
Creatinine, Ser: 4.34 mg/dL — ABNORMAL HIGH (ref 0.61–1.24)
GFR, Estimated: 12 mL/min — ABNORMAL LOW (ref >60)
Glucose, Bld: 95 mg/dL (ref 70–99)
Potassium: 4.6 mmol/L (ref 3.5–5.1)
Sodium: 136 mmol/L (ref 135–145)

## 2021-10-26 LAB — BRAIN NATRIURETIC PEPTIDE: B Natriuretic Peptide: >4500 pg/mL — ABNORMAL HIGH (ref 0.0–100.0)

## 2021-10-26 LAB — PATHOLOGIST SMEAR REVIEW

## 2021-10-26 LAB — STREP PNEUMONIAE URINARY ANTIGEN: Strep Pneumo Urinary Antigen: NEGATIVE

## 2021-10-26 LAB — RESP PANEL BY RT-PCR (FLU A&B, COVID) ARPGX2
Influenza A by PCR: NEGATIVE
Influenza B by PCR: NEGATIVE
SARS Coronavirus 2 by RT PCR: NEGATIVE

## 2021-10-26 MED ORDER — ASPIRIN EC 81 MG PO TBEC
81.0000 mg | DELAYED_RELEASE_TABLET | Freq: Two times a day (BID) | ORAL | Status: DC
  Administered 2021-10-26 – 2021-11-09 (×28): 81 mg via ORAL
  Filled 2021-10-26 (×28): qty 1

## 2021-10-26 MED ORDER — LEVOTHYROXINE SODIUM 50 MCG PO TABS
75.0000 ug | ORAL_TABLET | Freq: Every day | ORAL | Status: DC
  Administered 2021-10-26 – 2021-11-09 (×14): 75 ug via ORAL
  Filled 2021-10-26: qty 1
  Filled 2021-10-26: qty 2
  Filled 2021-10-26: qty 1
  Filled 2021-10-26: qty 2
  Filled 2021-10-26: qty 1
  Filled 2021-10-26: qty 2
  Filled 2021-10-26 (×6): qty 1
  Filled 2021-10-26: qty 2
  Filled 2021-10-26: qty 1

## 2021-10-26 MED ORDER — MIDODRINE HCL 5 MG PO TABS
10.0000 mg | ORAL_TABLET | Freq: Three times a day (TID) | ORAL | Status: DC
  Administered 2021-10-26 – 2021-11-09 (×42): 10 mg via ORAL
  Filled 2021-10-26 (×43): qty 2

## 2021-10-26 MED ORDER — LIOTHYRONINE SODIUM 5 MCG PO TABS
5.0000 ug | ORAL_TABLET | Freq: Every day | ORAL | Status: DC
  Administered 2021-10-26 – 2021-11-09 (×15): 5 ug via ORAL
  Filled 2021-10-26 (×15): qty 1

## 2021-10-26 MED ORDER — CALCITRIOL 0.25 MCG PO CAPS
0.2500 ug | ORAL_CAPSULE | Freq: Every day | ORAL | Status: DC
  Administered 2021-10-26 – 2021-11-09 (×15): 0.25 ug via ORAL
  Filled 2021-10-26 (×16): qty 1

## 2021-10-26 MED ORDER — LEVOFLOXACIN IN D5W 750 MG/150ML IV SOLN: 750.0000 mg | INTRAVENOUS | Status: DC

## 2021-10-26 MED ORDER — ONDANSETRON HCL 4 MG PO TABS: 4.0000 mg | ORAL_TABLET | Freq: Four times a day (QID) | ORAL | Status: DC | PRN

## 2021-10-26 MED ORDER — ALBUTEROL SULFATE (2.5 MG/3ML) 0.083% IN NEBU: 2.5000 mg | INHALATION_SOLUTION | RESPIRATORY_TRACT | Status: DC | PRN

## 2021-10-26 MED ORDER — TRAZODONE HCL 50 MG PO TABS
25.0000 mg | ORAL_TABLET | Freq: Every evening | ORAL | Status: DC | PRN
  Administered 2021-10-31 – 2021-11-06 (×4): 25 mg via ORAL
  Filled 2021-10-26 (×4): qty 1

## 2021-10-26 MED ORDER — FUROSEMIDE 10 MG/ML IJ SOLN
20.0000 mg | Freq: Two times a day (BID) | INTRAMUSCULAR | Status: DC
  Administered 2021-10-26 (×2): 20 mg via INTRAVENOUS
  Filled 2021-10-26 (×2): qty 2

## 2021-10-26 MED ORDER — IPRATROPIUM-ALBUTEROL 0.5-2.5 (3) MG/3ML IN SOLN
3.0000 mL | Freq: Four times a day (QID) | RESPIRATORY_TRACT | Status: DC
  Filled 2021-10-26: qty 3

## 2021-10-26 MED ORDER — ADULT MULTIVITAMIN W/MINERALS CH
1.0000 | ORAL_TABLET | Freq: Every day | ORAL | Status: DC
  Administered 2021-10-26 – 2021-11-09 (×15): 1 via ORAL
  Filled 2021-10-26 (×15): qty 1

## 2021-10-26 MED ORDER — ONDANSETRON HCL 4 MG/2ML IJ SOLN: 4.0000 mg | Freq: Four times a day (QID) | INTRAMUSCULAR | Status: DC | PRN

## 2021-10-26 MED ORDER — HEPARIN SODIUM (PORCINE) 5000 UNIT/ML IJ SOLN
5000.0000 [IU] | Freq: Three times a day (TID) | INTRAMUSCULAR | Status: DC
  Administered 2021-10-26 – 2021-11-09 (×40): 5000 [IU] via SUBCUTANEOUS
  Filled 2021-10-26 (×39): qty 1

## 2021-10-26 MED ORDER — PROSOURCE PLUS PO LIQD
30.0000 mL | Freq: Two times a day (BID) | ORAL | Status: DC
  Administered 2021-10-26 – 2021-11-09 (×23): 30 mL via ORAL
  Filled 2021-10-26 (×30): qty 30

## 2021-10-26 MED ORDER — SODIUM CHLORIDE 0.9 % IV SOLN
500.0000 mg | INTRAVENOUS | Status: DC
  Filled 2021-10-26: qty 5

## 2021-10-26 MED ORDER — ENOXAPARIN SODIUM 40 MG/0.4ML IJ SOSY: 40.0000 mg | PREFILLED_SYRINGE | INTRAMUSCULAR | Status: DC

## 2021-10-26 MED ORDER — ACETAMINOPHEN 650 MG RE SUPP: 650.0000 mg | Freq: Four times a day (QID) | RECTAL | Status: DC | PRN

## 2021-10-26 MED ORDER — GUAIFENESIN ER 600 MG PO TB12
600.0000 mg | ORAL_TABLET | Freq: Two times a day (BID) | ORAL | Status: DC
  Administered 2021-10-26 – 2021-11-09 (×29): 600 mg via ORAL
  Filled 2021-10-26 (×29): qty 1

## 2021-10-26 MED ORDER — ACETAMINOPHEN 325 MG PO TABS: 650.0000 mg | ORAL_TABLET | Freq: Four times a day (QID) | ORAL | Status: DC | PRN

## 2021-10-26 MED ORDER — MAGNESIUM HYDROXIDE 400 MG/5ML PO SUSP: 30.0000 mL | Freq: Every day | ORAL | Status: DC | PRN

## 2021-10-26 MED ORDER — POLYETHYLENE GLYCOL 3350 17 G PO PACK: 17.0000 g | PACK | Freq: Every day | ORAL | Status: DC | PRN

## 2021-10-26 MED ORDER — PRAVASTATIN SODIUM 20 MG PO TABS
20.0000 mg | ORAL_TABLET | Freq: Every evening | ORAL | Status: DC
  Administered 2021-10-26 – 2021-11-09 (×14): 20 mg via ORAL
  Filled 2021-10-26 (×14): qty 1

## 2021-10-26 MED ORDER — SODIUM CHLORIDE 0.9 % IV SOLN
1.0000 g | INTRAVENOUS | Status: DC
  Filled 2021-10-26: qty 10

## 2021-10-26 NOTE — Assessment & Plan Note (Signed)
-   Local wound care

## 2021-10-26 NOTE — H&P (Signed)
[place]   PATIENT NAME: [name]    MR#:  [id number]  DATE OF BIRTH:  1930/10/06  DATE OF ADMISSION:  10/25/2021  PRIMARY CARE PHYSICIAN: Baxter Hire, MD   Patient is coming from: SNF  REQUESTING/REFERRING PHYSICIAN: Valora Piccolo, MD  CHIEF COMPLAINT:   Fluid overload  HISTORY OF PRESENT ILLNESS:  NOMAR BROAD is a 88 y.o. Caucasian male with medical history significant for chronic medical problems that are mentioned below, including stage IV CKD, CHF, hypertension, dyslipidemia, and aortic aneurysm as well as hiatal hernia and diverticulosis with history of perforated diverticulitis, who was recently admitted here for new onset CHF and hypothyroidism and discharged to [facility] on 1/31, who presented to the emergency room with acute onset of suspected fluid overload with facial and abdominal wall edema. He was thought to be dehydrated at his SNF and was given IV fluids lately.  He admitted to cough over the last couple of days with no significant dyspnea or wheezing.  No chest pain or palpitations.  He has not had much urine output despite his hydration.  No nausea or vomiting or diarrhea or abdominal pain.  No dysuria or hematuria or flank pain.  ED Course: When he came to the ER, Pulsoxymeter was 94% on 2 L of O2 by nasal cannula with otherwise normal vital signs.  Labs revealed a BUN of 157 and creatinine of 4.44 compared to 130/3.54.  BNP was 4500.  High-sensitivity troponin was 61 and later 60.  CBC showed H&H of 8.7 and 27.4 close to baseline with macrocytosis. EKG as reviewed by me : EKG showed sinus bradycardia with rate 54 with short PR interval Imaging: Chest x-ray showed the following: 1. New peripheral airspace disease in the left mid lung with increasing airspace disease at left base suspicious for acute infectious or inflammatory process 2. Cardiomegaly with vascular congestion and probable low-grade edema. There are bilateral effusions which  may be slightly loculated on the right.  The patient was given IV Rocephin and Zithromax and 20 mg of IV Lasix.  He will be admitted to a cardiac telemetry bed for further evaluation and management. PAST MEDICAL HISTORY:   Past Medical History:  Diagnosis Date   Anemia    Aortic aneurysm (HCC)    Aortic aneurysm (HCC)    Arthritis    CHF (congestive heart failure) (HCC)    CKD (chronic kidney disease)    ALSO LEFT RENAL MASS   Coronary artery disease    Diverticulosis    History of perforated diverticulitis   GERD (gastroesophageal reflux disease)    Hip fracture ([place]) 05/11/2020   History of hiatal hernia    Hypercholesteremia    Hypertension    MGUS (monoclonal gammopathy of unknown significance)    MGUS (monoclonal gammopathy of unknown significance)    Periprosthetic fracture around internal prosthetic hip joint 08/05/2020   Pressure injury of skin 08/07/2020    PAST SURGICAL HISTORY:   Past Surgical History:  Procedure Laterality Date   AORTIC VALVE REPLACEMENT     bipass     CARDIAC VALVE REPLACEMENT     pig valve   CHOLECYSTECTOMY     Open   COLON SURGERY     History of Hartman's procedure with subsequent colostomy reversal   CORONARY ANGIOPLASTY     CORONARY ARTERY BYPASS GRAFT     ESOPHAGOGASTRODUODENOSCOPY (EGD) WITH PROPOFOL N/A 02/07/2015   Procedure: ESOPHAGOGASTRODUODENOSCOPY (EGD) WITH PROPOFOL;  Surgeon: Lupita Dawn  Candace Cruise, MD;  Location: [facility];  Service: Gastroenterology;  Laterality: N/A;   EYE SURGERY     HERNIA REPAIR     INTRAMEDULLARY (IM) NAIL INTERTROCHANTERIC Right 05/12/2020   Procedure: INTRAMEDULLARY (IM) NAIL INTERTROCHANTRIC;  Surgeon: Lovell Sheehan, MD;  Location: [facility];  Service: Orthopedics;  Laterality: Right;   JOINT REPLACEMENT     left knee x4   KNEE ARTHROPLASTY Right 04/02/2016   Procedure: COMPUTER ASSISTED TOTAL KNEE ARTHROPLASTY;  Surgeon: Dereck Leep, MD;  Location: [facility];  Service: Orthopedics;  Laterality:  Right;   ORIF FEMUR FRACTURE Right 08/08/2020   Procedure: OPEN REDUCTION INTERNAL FIXATION (ORIF) DISTAL FEMUR FRACTURE;  Surgeon: Lovell Sheehan, MD;  Location: [facility];  Service: Orthopedics;  Laterality: Right;    SOCIAL HISTORY:   Social History   Tobacco Use   Smoking status: Former   Smokeless tobacco: Never  Substance Use Topics   Alcohol use: No    FAMILY HISTORY:   Family History  Problem Relation Age of Onset   Hypertension Mother    Heart failure Mother    Heart failure Father    Bladder Cancer Neg Hx    Prostate cancer Neg Hx    Kidney cancer Neg Hx     DRUG ALLERGIES:   Allergies  Allergen Reactions   Morphine Anaphylaxis   Oxycodone Other (See Comments)    Hypotension and bradycardia   Carvedilol     Dizziness and syncope    Cefuroxime Axetil Swelling   Iodinated Contrast Media Rash    Other reaction(s): Asthenia (finding), Other (qualifier value) paralyzed legs Other reaction(s): RASH    Lisinopril Cough and Other (See Comments)    REVIEW OF SYSTEMS:   ROS As per history of present illness. All pertinent systems were reviewed above. Constitutional, HEENT, cardiovascular, respiratory, GI, GU, musculoskeletal, neuro, psychiatric, endocrine, integumentary and hematologic systems were reviewed and are otherwise negative/unremarkable except for positive findings mentioned above in the HPI.   MEDICATIONS AT HOME:   Prior to Admission medications   Medication Sig Start Date End Date Taking? Authorizing Provider  acetaminophen (TYLENOL) 500 MG tablet Take 1,000 mg by mouth every 6 (six) hours as needed for mild pain.    [provider]  albuterol (VENTOLIN HFA) 108 (90 Base) MCG/ACT inhaler Inhale 1-2 puffs into the lungs every 6 (six) hours as needed for wheezing or shortness of breath. 01/16/21 01/16/22  [provider]  aspirin EC 81 MG tablet Take 1 tablet (81 mg total) by mouth 2 (two) times daily. Swallow whole. 08/10/20   Donne Hazel, MD  calcitRIOL (ROCALTROL) 0.25 MCG capsule Take 0.25 mcg by mouth daily. 07/03/21   [provider]  levothyroxine (SYNTHROID) 75 MCG tablet Take 1 tablet (75 mcg total) by mouth daily at 6 (six) AM. 10/13/21 11/12/21  Antonieta Pert, MD  liothyronine (CYTOMEL) 5 MCG tablet Take 1 tablet (5 mcg total) by mouth daily. 10/13/21 11/12/21  Antonieta Pert, MD  midodrine (PROAMATINE) 10 MG tablet Take 1 tablet (10 mg total) by mouth 3 (three) times daily with meals. 10/12/21   Antonieta Pert, MD  Multiple Vitamin (MULTIVITAMIN WITH MINERALS) TABS tablet Take 1 tablet by mouth daily. 10/13/21   Antonieta Pert, MD  Nutritional Supplements (,FEEDING SUPPLEMENT, PROSOURCE PLUS) liquid Take 30 mLs by mouth 2 (two) times daily between meals. 10/12/21   Antonieta Pert, MD  pravastatin (PRAVACHOL) 20 MG tablet Take 20 mg by mouth every evening.    [provider]  torsemide 40 MG TABS Take 40 mg by mouth daily. 10/16/21   Antonieta Pert, MD      VITAL SIGNS:  Blood pressure 100/74, pulse 64, temperature (!) 97.1 F (36.2 C), temperature source Oral, resp. rate 20, height 6' (1.829 m), weight 70.3 kg, SpO2 99 %.  PHYSICAL EXAMINATION:  Physical Exam  GENERAL:  88 y.o.-year-old Caucasian patient lying in the bed with mild respiratory distress with conversational dyspnea.Marland Kitchen  EYES: Pupils equal, round, reactive to light and accommodation. No scleral icterus. Extraocular muscles intact.  HEENT: Head atraumatic, normocephalic. Oropharynx and nasopharynx clear.  NECK:  Supple, no jugular venous distention. No thyroid enlargement, no tenderness.  LUNGS: Diminished bibasilar breath sounds with bibasilar rales. CARDIOVASCULAR: Regular rate and rhythm, S1, S2 normal. No murmurs, rubs, or gallops.  ABDOMEN: Soft, nondistended, nontender. Bowel sounds present. No organomegaly or mass.  EXTREMITIES: Trace bilateral lower extremity pitting edema, no cyanosis, or clubbing.  NEUROLOGIC: Cranial nerves II through XII are  intact. Muscle strength 5/5 in all extremities. Sensation intact. Gait not checked.  PSYCHIATRIC: The patient is alert and oriented x 3.  Normal affect and good eye contact. SKIN: No obvious rash, lesion, or ulcer.   LABORATORY PANEL:   CBC Recent Labs  Lab 10/26/21 0625  WBC 4.9  HGB 8.5*  HCT 26.1*  PLT 157   ------------------------------------------------------------------------------------------------------------------  Chemistries  Recent Labs  Lab 10/25/21 2136  NA 133*  K 4.5  CL 99  CO2 22  GLUCOSE 103*  BUN 157*  CREATININE 4.44*  CALCIUM 8.5*  AST 42*  ALT 61*  ALKPHOS 119  BILITOT 0.9   ------------------------------------------------------------------------------------------------------------------  Cardiac Enzymes No results for input(s): TROPONINI in the last 168 hours. ------------------------------------------------------------------------------------------------------------------  RADIOLOGY:  DG Chest Port 1 View  Result Date: 10/25/2021 CLINICAL DATA:  Shortness of breath EXAM: PORTABLE CHEST 1 VIEW COMPARISON:  10/07/2021, CT 05/19/2021, radiograph 09/30/2021, 07/28/2021, 02/07/2021 FINDINGS: Post sternotomy changes. Valve prosthesis. Cardiomegaly with vascular congestion and mild interstitial edema. Small loculated right pleural effusion. New ground-glass peripheral opacity in the left mid lung. Aortic atherosclerosis. No pneumothorax. Slight increased airspace disease at left base. IMPRESSION: 1. New peripheral airspace disease in the left mid lung with increasing airspace disease at left base suspicious for acute infectious or inflammatory process 2. Cardiomegaly with vascular congestion and probable low-grade edema. There are bilateral effusions which may be slightly loculated on the right Electronically Signed   By: Donavan Foil M.D.   On: 10/25/2021 22:53      IMPRESSION AND PLAN:  Principal Problem:   Acute CHF (congestive heart failure)  ([place])  1.  Acute on chronic systolic CHF with EF less than 20% per 2D echo on 10/03/2021, in the setting of AKI superimposed on stage IV CKD.  This last echo showed the moderately dilated left and right atria, moderate to severe tricuspid regurgitation and aortic valve replacement with trivial regurgitation.  The patient has subsequent acute hypoxic respiratory failure. - The patient will be admitted to a cardiac telemetry bed. - We will continue diuresis with IV Lasix. - We will obtain a nephrology consult for now as fluid overload with deteriorating renal functions is playing the remaining role. - I notified Dr. Juleen China about the patient.  He may need acute hemodialysis. - We will monitor I's and O's. - We will follow serial troponins. - O2 protocol will be followed.  2.  Left-sided pneumonia community-acquired pneumonia. - We will place him on IV Levaquin given cephalosporin allergy. -  We will follow blood and sputum cultures. - Mucolytic's and bronchodilator therapy will be provided.  3.  UTI. - This should be covered with IV Levaquin.  4.  Hypothyroidism. - We will continue thyroid replacement.  5.  Paroxysmal atrial fibrillation. - We will continue his amiodarone and aspirin. - The patient will be placed on Lovenox.  6.  Dyslipidemia. - We will continue statin therapy.  DVT prophylaxis: Lovenox. Code Status: full code. Family Communication:  The plan of care was discussed in details with the patient (and family). I answered all questions. The patient agreed to proceed with the above mentioned plan. Further management will depend upon hospital course. Disposition Plan: Back to previous home environment Consults called: Nephrology. All the records are reviewed and case discussed with ED provider.  Status is: Inpatient   At the time of the admission, it appears that the appropriate admission status for this patient is inpatient.  This is judged to be reasonable and necessary  in order to provide the required intensity of service to ensure the patient's safety given the presenting symptoms, physical exam findings and initial radiographic and laboratory data in the context of comorbid conditions.  The patient requires inpatient status due to high intensity of service, high risk of further deterioration and high frequency of surveillance required.  I certify that at the time of admission, it is my clinical judgment that the patient will require inpatient hospital care extending more than 2 midnights.                            Dispo: The patient is from: Home              Anticipated d/c is to: Home              Patient currently is not medically stable to d/c.              Difficult to place patient: No   Christel Mormon M.D on 10/26/2021 at 7:20 AM  [facility]   From 7 PM-7 AM, contact night-coverage [email or web address]  CC: Primary care physician; Baxter Hire, MD

## 2021-10-26 NOTE — Assessment & Plan Note (Signed)
Hgb stable, no clinical bleeding 

## 2021-10-26 NOTE — Assessment & Plan Note (Addendum)
Creatinine improved with diuresis.  Likely his worsening renal function at his facility was congestion not 'dehydration'.  Patient has declined dialysis prior to now and again now.  I suspect that this fluid overload can be managed with high-dose diuretics and acidosis with bicarb.  Unfortunately his BUN is up to 140s and he appears to be starting to get symptomatic with itching and sleepiness and I suspect that this will be a life limiting factor for him if he does not do dialysis. - Consult nephrology - Consult Palliative Care

## 2021-10-26 NOTE — Progress Notes (Signed)
°  Progress Note   Patient: [name] WER:[id number] DOB: Dec 29, 1930 DOA: 10/25/2021     1 DOS: the patient was seen and examined on 10/26/2021       Brief hospital course: [name] is an 25 yr. M with CKD V, baseline 3.9, sCHF EF <20% due to ischemic CM, AS and CAD s/p CABG and AVR 2011, pAF not on AC, PVD and recent life-threatening myxedema coma who presented from SNF with abnormal labs.  Patient has been at [facility] for the last 10 days prior to admission, felt like he was "getting better".  Able to get out of bed, more alert.  Last few days he was told that he had "dehydration", so was given IV fluids at the facility.  Then sent to the ER for "dehydration".  In the ER, creatinine up to 4.4, up from 3.5 at discharge.  He had peripheral edema, BNP greater than 4500, and chest x-ray with worsened bilateral airspace opacities.  Started on Lasix and admitted.      Assessment and Plan: * Acute on chronic systolic CHF (congestive heart failure) ([place])- (present on admission) -Continue Lasix - Consult Nephrology for assistance with diuretics in setting of cardiorenal syndrome, advanced chronic renal disease  CKD (chronic kidney disease), stage V ([place])- (present on admission) Cr stlightly better after lasix.  Suspect congestion. - Consult nephrology  Hypothyroidism Recently nearly died from Myxedema coma.  Clearly improving from the standpoint of thyroid failure, but overall prognosis is still dire given the underlying pre-existing chronic renal and cardiac failure. -Continue levothyroxine, liothyronine  Schistocytes on peripheral blood smear In setting of AV graft, and without anemia or thrombocytopenia, I have low suspicion for MAHA. - Trend CBC  Anemia in other chronic diseases classified elsewhere- (present on admission) Hgb stable, no clinical bleeding  AF (paroxysmal atrial fibrillation) ([place])- (present on admission) Not on anticoagulation due to pre-existing  factors. -Continue low dose aspirin  Pressure injury of buttock, stage 2 ([place])- (present on admission) - Local wound care  Benign essential HTN- (present on admission) BP low normal -Hold losartan - Continue midodrine -Continue furosemide        Subjective:   Physical Exam: Vitals:   10/26/21 0200 10/26/21 0400 10/26/21 0754 10/26/21 1220  BP: 91/68 100/74 92/62 (!) 91/58  Pulse: 63 64 (!) 58 63  Resp: 20 20 20 20   Temp: 97.7 F (36.5 C) (!) 97.1 F (36.2 C) (!) 97.5 F (36.4 C) (!) 97.4 F (36.3 C)  TempSrc:  Oral    SpO2: 98% 99% 100% 96%  Weight:      Height:       This is a no charge note, for further details, please see H&P by Dr. Sidney Ace from earlier today.  Data Reviewed: My review of imaging and results is notable for chest x-ray with bilateral opacities, normal platelets, white blood cells normal, hemoglobin 8.5 and stable, urinalysis with bacteria, BNP 4500, creatinine 3.3  Family Communication:   Disposition: Status is: Inpatient Remains inpatient appropriate because: Requires ongoing IV Lasix          Planned Discharge Destination: Skilled nursing facility        Author: Edwin Dada, MD 10/26/2021 4:04 PM  For on call review [email or web address].

## 2021-10-26 NOTE — Progress Notes (Signed)
°   10/26/21 1900  Clinical Encounter Type  Visited With Patient and family together  Visit Type Follow-up;Social support   Chaplain noticed the return of previously seen patient in ICU. Patient provided support through conversation and presence.

## 2021-10-26 NOTE — Assessment & Plan Note (Addendum)
BP low normal - Hold losartan - Continue midodrine - Continue furosemide

## 2021-10-26 NOTE — Assessment & Plan Note (Signed)
Not on anticoagulation due to pre-existing factors. -Continue low dose aspirin

## 2021-10-26 NOTE — Assessment & Plan Note (Addendum)
Net negative 1.6L yesterday, Cr stable, BUN yesterday likely erroneous.    Completed 3 days high dose torsemide, appears euvolemic. - Transition to torsemide 40 daily - Start metolazone 2.5 twice weekly   - Consult Nephrology, appreciate expertise

## 2021-10-26 NOTE — Hospital Course (Addendum)
[name] is an 70 yr. M with CKD V, baseline 3.9, sCHF EF <20% due to ischemic CM, AS and CAD s/p CABG and AVR 2011, pAF not on AC, PVD and recent life-threatening myxedema coma who presented from SNF with abnormal labs.  Prior to admission, patient had recently been discharged from hospital to SNF after myxedema crisis, had been at [facility] for about 10 days prior to admission, felt like he was "getting better".  Able to get out of bed, more alert.  In the last few days prior to admission he was told that he had "dehydration", so was given IV fluids at the facility.  Then sent to the ER for "dehydration".  In the ER, creatinine up to 4.4, up from 3.5 at discharge.  He had peripheral edema, BNP greater than 4500, and chest x-ray with worsened bilateral airspace opacities.  Started on Lasix and admitted.

## 2021-10-26 NOTE — Assessment & Plan Note (Addendum)
Incidental finding on admission.  In setting of AV graft, and without anemia or thrombocytopenia, I have low suspicion for MAHA.  CBC has remained normal.   MAHA ruled out.  No further work up necessary.

## 2021-10-26 NOTE — Consult Note (Signed)
Consultation Note Date: 10/26/2021   Patient Name: [name]  DOB: 1930/12/06  MRN: [id number]  Age / Sex: 88 y.o., male  PCP: Baxter Hire, MD Referring Physician: Edwin Dada, *  Reason for Consultation: Establishing goals of care  HPI/Patient Profile: [name] is a 88 y.o. Caucasian male with medical history significant for chronic medical problems that are mentioned below, including stage IV CKD, CHF, hypertension, dyslipidemia, and aortic aneurysm as well as hiatal hernia and diverticulosis with history of perforated diverticulitis, who was recently admitted here for new onset CHF and hypothyroidism and discharged to [facility] on 1/31, who presented to the emergency room with acute onset of suspected fluid overload with facial and abdominal wall edema. He was thought to be dehydrated at his SNF and was given IV fluids lately.  Clinical Assessment and Goals of Care: Patient is resting in bed with son and DIL at bedside. Patient is known to PMT, and to me from last admission.   He states he felt like he was doing okay at rehab. He states he doubts he will ever be able to go home and live alone again, which was a goal of rehab. He states he is only able to walk very short distances before becoming SOB, and he does not feel like this is improving; he understands his heart failure and AKI and CKD. He understands the tenuousness of his health management.     Inquired about what he considers an acceptable QOL; he is unsure. He is unsure of his wishes at this time on care moving forward.   He confirms DNR/DNI.     SUMMARY OF RECOMMENDATIONS   He confirms DNR/DNI. He is unsure about  any other decisions on care moving forward.     Prognosis:  < 6 months      Primary Diagnoses: Present on Admission: **None**   I have reviewed the medical record, interviewed the patient  and family, and examined the patient. The following aspects are pertinent.  Past Medical History:  Diagnosis Date   Anemia    Aortic aneurysm (HCC)    Aortic aneurysm (HCC)    Arthritis    CHF (congestive heart failure) (HCC)    CKD (chronic kidney disease)    ALSO LEFT RENAL MASS   Coronary artery disease    Diverticulosis    History of perforated diverticulitis   GERD (gastroesophageal reflux disease)    Hip fracture ([place]) 05/11/2020   History of hiatal hernia    Hypercholesteremia    Hypertension    MGUS (monoclonal gammopathy of unknown significance)    MGUS (monoclonal gammopathy of unknown significance)    Periprosthetic fracture around internal prosthetic hip joint 08/05/2020   Pressure injury of skin 08/07/2020   Social History   Socioeconomic History   Marital status: Widowed    Spouse name: Not on file   Number of children: Not on file   Years of education: Not on file   Highest education level: Not on file  Occupational History   Not on file  Tobacco Use   Smoking status: Former   Smokeless tobacco: Never  Vaping Use   Vaping Use: Never used  Substance and Sexual Activity   Alcohol use: No   Drug use: No   Sexual activity: Not on file  Other Topics Concern   Not on file  Social History Narrative   Not on file   Social Determinants of Health   Financial Resource Strain: Not on file  Food Insecurity: Not on file  Transportation Needs: Not on file  Physical Activity: Not on file  Stress: Not on file  Social Connections: Not on file   Family History  Problem Relation Age of Onset   Hypertension Mother    Heart failure Mother    Heart failure Father    Bladder Cancer Neg Hx    Prostate cancer Neg Hx    Kidney cancer Neg Hx    Scheduled Meds:  (feeding supplement) PROSource Plus  30 mL Oral BID BM   aspirin EC  81 mg Oral BID   calcitRIOL  0.25 mcg Oral Daily   furosemide  20 mg Intravenous Q12H   guaiFENesin  600 mg Oral BID   heparin  injection (subcutaneous)  5,000 Units Subcutaneous Q8H   levothyroxine  75 mcg Oral Q0600   liothyronine  5 mcg Oral Daily   midodrine  10 mg Oral TID WC   multivitamin with minerals  1 tablet Oral Daily   pravastatin  20 mg Oral QPM   Continuous Infusions: PRN Meds:.acetaminophen **OR** acetaminophen, albuterol, ondansetron **OR** ondansetron (ZOFRAN) IV, polyethylene glycol, traZODone Medications Prior to Admission:  Prior to Admission medications   Medication Sig Start Date End Date Taking? Authorizing Provider  aspirin EC 81 MG tablet Take 1 tablet (81 mg total) by mouth 2 (two) times daily. Swallow whole. 08/10/20  Yes Donne Hazel, MD  calcitRIOL (ROCALTROL) 0.25 MCG capsule Take 0.25 mcg by mouth daily. 07/03/21  Yes [provider]  levothyroxine (SYNTHROID) 75 MCG tablet Take 1 tablet (75 mcg total) by mouth daily at 6 (six) AM. 10/13/21 11/12/21 Yes Kc, Maren Beach, MD  liothyronine (CYTOMEL) 5 MCG tablet Take 1 tablet (5 mcg total) by mouth daily. 10/13/21 11/12/21 Yes Antonieta Pert, MD  midodrine (PROAMATINE) 10 MG tablet Take 1 tablet (10 mg total) by mouth 3 (three) times daily with meals. 10/12/21  Yes Antonieta Pert, MD  Multiple Vitamin (MULTIVITAMIN WITH MINERALS) TABS tablet Take 1 tablet by mouth daily. 10/13/21  Yes Antonieta Pert, MD  torsemide 40 MG TABS Take 40 mg by mouth daily. 10/16/21  Yes Antonieta Pert, MD  acetaminophen (TYLENOL) 500 MG tablet Take 1,000 mg by mouth every 6 (six) hours as needed for mild pain.    [provider]  albuterol (VENTOLIN HFA) 108 (90 Base) MCG/ACT inhaler Inhale 1-2 puffs into the lungs every 6 (six) hours as needed for wheezing or shortness of breath. 01/16/21 01/16/22  [provider]  amiodarone (PACERONE) 200 MG tablet Take 200 mg by mouth daily. Patient not taking: Reported on 10/26/2021 10/18/21   [provider]  losartan (COZAAR) 25 MG tablet Take 25 mg by mouth daily. Patient not taking: Reported on 10/26/2021 10/18/21    [provider]  Nutritional Supplements (,FEEDING SUPPLEMENT, PROSOURCE PLUS) liquid Take 30 mLs by mouth 2 (two) times daily between meals. 10/12/21   Antonieta Pert, MD  pravastatin (PRAVACHOL) 20 MG tablet Take 20 mg by mouth every evening.  [provider]   Allergies  Allergen Reactions   Morphine Anaphylaxis   Oxycodone Other (See Comments)    Hypotension and bradycardia   Carvedilol     Dizziness and syncope    Cefuroxime Axetil Swelling   Iodinated Contrast Media Rash    Other reaction(s): Asthenia (finding), Other (qualifier value) paralyzed legs Other reaction(s): RASH    Lisinopril Cough and Other (See Comments)   Review of Systems  All other systems reviewed and are negative.  Physical Exam Pulmonary:     Effort: Pulmonary effort is normal.  Neurological:     Mental Status: He is alert.    Vital Signs: BP (!) 91/58 (BP Location: Right Arm)    Pulse 63    Temp (!) 97.4 F (36.3 C)    Resp 20    Ht 6' (1.829 m)    Wt 70.3 kg    SpO2 96%    BMI 21.02 kg/m  Pain Scale: 0-10   Pain Score: 0-No pain   SpO2: SpO2: 96 % O2 Device:SpO2: 96 % O2 Flow Rate: .O2 Flow Rate (L/min): 2 L/min  IO: Intake/output summary:  Intake/Output Summary (Last 24 hours) at 10/26/2021 1509 Last data filed at 10/26/2021 1431 Gross per 24 hour  Intake 480 ml  Output --  Net 480 ml    LBM:   Baseline Weight: Weight: 70.3 kg Most recent weight: Weight: 70.3 kg       Time In: 1:30 Time Out: 2:15 Time Total: 45 min Greater than 50%  of this time was spent counseling and coordinating care related to the above assessment and plan.  Signed by: Asencion Gowda, NP   Please contact Palliative Medicine Team phone at [phone] for questions and concerns.  For individual provider: See Shea Evans

## 2021-10-26 NOTE — Progress Notes (Addendum)
[facility]  ROUNDING NOTE   Subjective:   [name] is a 88 year old with past medical history of hypertension, dyslipidemia, aortic aneurysm, and stage IV CKD.  Patient presents from rehab facility with fluid overloa.  With suspected to be dehydrated and prescribed IV fluids.  Patient states no shortness of breath but bilateral lower extremity and abdominal edema.  Patient has been admitted for Acute pulmonary edema (HCC) [J81.0] Acute CHF (congestive heart failure) ([place]) [I50.9] Urinary tract infection without hematuria, site unspecified [N39.0]  Patient is known to our practice and receives outpatient care from Dr. Holley Raring.  Patient was recently experienced prolonged admission for CHF exacerbation.  Patient states he feels fine, denies shortness of breath and cough.  Denies nausea and vomiting.  Currently at [place] receiving rehab from previous admission, deconditioning.  Labs on arrival include sodium 133, glucose 103, BUN 157, creatinine 4.44 with GFR 12.  BNP elevated greater than 4.5.  Chest x-ray shows small right pleural effusion with vascular congestion.  We have been consulted for acute kidney injury.   Objective:  Vital signs in last 24 hours:  Temp:  [97.1 F (36.2 C)-98.4 F (36.9 C)] 97.4 F (36.3 C) (02/09 1220) Pulse Rate:  [58-67] 63 (02/09 1220) Resp:  [12-20] 20 (02/09 1220) BP: (90-144)/(58-118) 91/58 (02/09 1220) SpO2:  [94 %-100 %] 96 % (02/09 1220) Weight:  [70.3 kg] 70.3 kg (02/08 2029)  Weight change:  Filed Weights   10/25/21 2029  Weight: 70.3 kg    Intake/Output: No intake/output data recorded.   Intake/Output this shift:  Total I/O In: 480 [P.O.:480] Out: -   Physical Exam: General: NAD, resting comfortably  Head: Normocephalic, atraumatic. Moist oral mucosal membranes  Eyes: Anicteric  Lungs:  Crackles, normal effort, O2   Heart: Regular rate and rhythm  Abdomen:  Soft, nontender  Extremities:  1+  peripheral edema.  Neurologic: Nonfocal, moving all four extremities  Skin: No lesions       Basic Metabolic Panel: Recent Labs  Lab 10/25/21 2136 10/26/21 0625  NA 133* 136  K 4.5 4.6  CL 99 100  CO2 22 21*  GLUCOSE 103* 95  BUN 157* 157*  CREATININE 4.44* 4.34*  CALCIUM 8.5* 8.4*    Liver Function Tests: Recent Labs  Lab 10/25/21 2136  AST 42*  ALT 61*  ALKPHOS 119  BILITOT 0.9  PROT 7.0  ALBUMIN 3.4*   No results for input(s): LIPASE, AMYLASE in the last 168 hours. No results for input(s): AMMONIA in the last 168 hours.  CBC: Recent Labs  Lab 10/25/21 2136 10/26/21 0625  WBC 5.3 4.9  NEUTROABS 3.5  --   HGB 8.7* 8.5*  HCT 27.4* 26.1*  MCV 111.4* 108.3*  PLT 171 157    Cardiac Enzymes: No results for input(s): CKTOTAL, CKMB, CKMBINDEX, TROPONINI in the last 168 hours.  BNP: Invalid input(s): POCBNP  CBG: No results for input(s): GLUCAP in the last 168 hours.  Microbiology: Results for orders placed or performed during the hospital encounter of 10/25/21  Blood culture (routine x 2)     Status: None (Preliminary result)   Collection Time: 10/25/21 11:41 PM   Specimen: BLOOD  Result Value Ref Range Status   Specimen Description BLOOD LEFT ASSIST CONTROL  Final   Special Requests   Final    BOTTLES DRAWN AEROBIC AND ANAEROBIC Blood Culture adequate volume   Culture   Final    NO GROWTH < 12 HOURS Performed at  [facility], [address], [place], [place] [address]    Report Status PENDING  Incomplete  Resp Panel by RT-PCR (Flu A&B, Covid) Nasopharyngeal Swab     Status: None   Collection Time: 10/25/21 11:41 PM   Specimen: Nasopharyngeal Swab; Nasopharyngeal(NP) swabs in vial transport medium  Result Value Ref Range Status   SARS Coronavirus 2 by RT PCR NEGATIVE NEGATIVE Final    Comment: (NOTE) SARS-CoV-2 target nucleic acids are NOT DETECTED.  The SARS-CoV-2 RNA is generally detectable in upper respiratory specimens during the  acute phase of infection. The lowest concentration of SARS-CoV-2 viral copies this assay can detect is 138 copies/mL. A negative result does not preclude SARS-Cov-2 infection and should not be used as the sole basis for treatment or other patient management decisions. A negative result may occur with  improper specimen collection/handling, submission of specimen other than nasopharyngeal swab, presence of viral mutation(s) within the areas targeted by this assay, and inadequate number of viral copies(<138 copies/mL). A negative result must be combined with clinical observations, patient history, and epidemiological information. The expected result is Negative.  Fact Sheet for Patients:  EntrepreneurPulse.com.au  Fact Sheet for Healthcare Providers:  IncredibleEmployment.be  This test is no t yet approved or cleared by the [place] FDA and  has been authorized for detection and/or diagnosis of SARS-CoV-2 by FDA under an Emergency Use Authorization (EUA). This EUA will remain  in effect (meaning this test can be used) for the duration of the COVID-19 declaration under Section 564(b)(1) of the Act, 21 U.S.C.section 360bbb-3(b)(1), unless the authorization is terminated  or revoked sooner.       Influenza A by PCR NEGATIVE NEGATIVE Final   Influenza B by PCR NEGATIVE NEGATIVE Final    Comment: (NOTE) The Xpert Xpress SARS-CoV-2/FLU/RSV plus assay is intended as an aid in the diagnosis of influenza from Nasopharyngeal swab specimens and should not be used as a sole basis for treatment. Nasal washings and aspirates are unacceptable for Xpert Xpress SARS-CoV-2/FLU/RSV testing.  Fact Sheet for Patients: EntrepreneurPulse.com.au  Fact Sheet for Healthcare Providers: IncredibleEmployment.be  This test is not yet approved or cleared by the [place] FDA and has been authorized for detection and/or  diagnosis of SARS-CoV-2 by FDA under an Emergency Use Authorization (EUA). This EUA will remain in effect (meaning this test can be used) for the duration of the COVID-19 declaration under Section 564(b)(1) of the Act, 21 U.S.C. section 360bbb-3(b)(1), unless the authorization is terminated or revoked.  Performed at [facility], [address] [place], [place] [address]   Blood culture (routine x 2)     Status: None (Preliminary result)   Collection Time: 10/25/21 11:43 PM   Specimen: BLOOD  Result Value Ref Range Status   Specimen Description BLOOD LEFT ASSIST CONTROL  Final   Special Requests   Final    BOTTLES DRAWN AEROBIC AND ANAEROBIC Blood Culture adequate volume   Culture   Final    NO GROWTH < 12 HOURS Performed at [facility], [address] [place],  [address]    Report Status PENDING  Incomplete    Coagulation Studies: No results for input(s): LABPROT, INR in the last 72 hours.  Urinalysis: Recent Labs    10/25/21 2111  COLORURINE YELLOW*  LABSPEC 1.010  PHURINE 5.0  GLUCOSEU NEGATIVE  HGBUR MODERATE*  BILIRUBINUR NEGATIVE  KETONESUR NEGATIVE  PROTEINUR 30*  NITRITE NEGATIVE  LEUKOCYTESUR LARGE*      Imaging: DG Chest  Port 1 View  Result Date: 10/25/2021 CLINICAL DATA:  Shortness of breath EXAM: PORTABLE CHEST 1 VIEW COMPARISON:  10/07/2021, CT 05/19/2021, radiograph 09/30/2021, 07/28/2021, 02/07/2021 FINDINGS: Post sternotomy changes. Valve prosthesis. Cardiomegaly with vascular congestion and mild interstitial edema. Small loculated right pleural effusion. New ground-glass peripheral opacity in the left mid lung. Aortic atherosclerosis. No pneumothorax. Slight increased airspace disease at left base. IMPRESSION: 1. New peripheral airspace disease in the left mid lung with increasing airspace disease at left base suspicious for acute infectious or inflammatory process 2. Cardiomegaly with vascular congestion and probable  low-grade edema. There are bilateral effusions which may be slightly loculated on the right Electronically Signed   By: Donavan Foil M.D.   On: 10/25/2021 22:53     Medications:     (feeding supplement) PROSource Plus  30 mL Oral BID BM   aspirin EC  81 mg Oral BID   calcitRIOL  0.25 mcg Oral Daily   furosemide  20 mg Intravenous Q12H   guaiFENesin  600 mg Oral BID   heparin injection (subcutaneous)  5,000 Units Subcutaneous Q8H   levothyroxine  75 mcg Oral Q0600   liothyronine  5 mcg Oral Daily   midodrine  10 mg Oral TID WC   multivitamin with minerals  1 tablet Oral Daily   pravastatin  20 mg Oral QPM   acetaminophen **OR** acetaminophen, albuterol, ondansetron **OR** ondansetron (ZOFRAN) IV, polyethylene glycol, traZODone  Assessment/ Plan:  [name] is a 88 y.o.  male with past medical history of hypertension, dyslipidemia, aortic aneurysm, and stage IV CKD.  Patient presents from rehab facility with fluid overload.  With suspected to be dehydrated and prescribed IV fluids.  Patient states no shortness of breath but bilateral lower extremity and abdominal edema.  Patient has been admitted for Acute pulmonary edema (HCC) [J81.0] Acute CHF (congestive heart failure) (HCC) [I50.9] Urinary tract infection without hematuria, site unspecified [N39.0]   Acute Kidney Injury on chronic kidney disease stage IV with baseline creatinine 3.54 and GFR of 16 on 10/14/21.  Acute kidney injury secondary to cardiorenal syndrome Chronic kidney disease secondary to hypertension and vascular disease. No IV contrast exposure. No acute indication for dialysis at this time Agree with Furosemide for diuresis. Continue to monitor.   Lab Results  Component Value Date   CREATININE 4.34 (H) 10/26/2021   CREATININE 4.44 (H) 10/25/2021   CREATININE 3.54 (H) 10/14/2021    Intake/Output Summary (Last 24 hours) at 10/26/2021 1453 Last data filed at 10/26/2021 1431 Gross per 24 hour  Intake 480  ml  Output --  Net 480 ml   2.  Chronic systolic heart failure.  Echo from 10/03/2021 shows EF less than 20% with left ventricular hypokinesis.    LOS: [place] 2/9/20232:53 PM

## 2021-10-26 NOTE — Assessment & Plan Note (Addendum)
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  Clearly improving from the standpoint of thyroid failure, but overall prognosis is still dire given the underlying pre-existing chronic renal and cardiac failure.  TSH improving as expected.  -Continue levothyroxine, liothyronine - If consistent with goals of care, would recommend Endocrinology follow up after discharge

## 2021-10-27 DIAGNOSIS — I10 Essential (primary) hypertension: Secondary | ICD-10-CM

## 2021-10-27 DIAGNOSIS — N185 Chronic kidney disease, stage 5: Secondary | ICD-10-CM

## 2021-10-27 DIAGNOSIS — E039 Hypothyroidism, unspecified: Secondary | ICD-10-CM

## 2021-10-27 DIAGNOSIS — I5023 Acute on chronic systolic (congestive) heart failure: Secondary | ICD-10-CM | POA: Diagnosis not present

## 2021-10-27 DIAGNOSIS — D638 Anemia in other chronic diseases classified elsewhere: Secondary | ICD-10-CM | POA: Diagnosis not present

## 2021-10-27 DIAGNOSIS — I480 Paroxysmal atrial fibrillation: Secondary | ICD-10-CM | POA: Diagnosis not present

## 2021-10-27 DIAGNOSIS — Z7189 Other specified counseling: Secondary | ICD-10-CM | POA: Diagnosis not present

## 2021-10-27 LAB — BASIC METABOLIC PANEL
Anion gap: 12 (ref 5–15)
BUN: 156 mg/dL — ABNORMAL HIGH (ref 8–23)
CO2: 21 mmol/L — ABNORMAL LOW (ref 22–32)
Calcium: 8.5 mg/dL — ABNORMAL LOW (ref 8.9–10.3)
Chloride: 99 mmol/L (ref 98–111)
Creatinine, Ser: 4.3 mg/dL — ABNORMAL HIGH (ref 0.61–1.24)
GFR, Estimated: 13 mL/min — ABNORMAL LOW (ref >60)
Glucose, Bld: 92 mg/dL (ref 70–99)
Potassium: 4.7 mmol/L (ref 3.5–5.1)
Sodium: 132 mmol/L — ABNORMAL LOW (ref 135–145)

## 2021-10-27 LAB — TSH: TSH: 29.551 u[IU]/mL — ABNORMAL HIGH (ref 0.350–4.500)

## 2021-10-27 LAB — LACTATE DEHYDROGENASE: LDH: 195 U/L — ABNORMAL HIGH (ref 98–192)

## 2021-10-27 LAB — LEGIONELLA PNEUMOPHILA SEROGP 1 UR AG: L. pneumophila Serogp 1 Ur Ag: NEGATIVE

## 2021-10-27 MED ORDER — FUROSEMIDE 10 MG/ML IJ SOLN
60.0000 mg | Freq: Two times a day (BID) | INTRAMUSCULAR | Status: DC
  Administered 2021-10-27 (×2): 60 mg via INTRAVENOUS
  Filled 2021-10-27 (×2): qty 6

## 2021-10-27 NOTE — Plan of Care (Signed)

## 2021-10-27 NOTE — Progress Notes (Signed)
Daily Progress Note   Patient Name: [name]       Date: 10/27/2021 DOB: 1931/04/06  Age: 88 y.o. MRN#: [id number] Attending Physician: Edwin Dada, * Primary Care Physician: Baxter Hire, MD Admit Date: 10/25/2021  Reason for Consultation/Follow-up: Establishing goals of care  Subjective: Patient is sitting in bed.  He is currently eating breakfast, and states that he generally feels short of breath when trying to eat and drink.   There is no family members at bedside at this time.  He states he is not sure how he feels about care moving forward, that there are a lot of things affecting his decisions.  Long conversation discussing concerns that are bothering him.  He discusses the disagreements and arguments between his 2 children; he states these started after he made his son his durable POA.  He states he will never choose a healthcare power of attorney as this would separate the family.   He discusses that he understands his diagnoses and his very poor prognosis.  He states he is really not interested in returning to the hospital, and that he would like to focus on comfort and dignity, but would have nowhere to go. He states that he he cannot go home as he will be unable to live by himself and there will be nobody to stay with him as they cannot afford to hire someone to assist as a caregiver.  He states he will not be able to live with his children.  He states the only option that he feels he has is returning to rehab.  He tells me that he prays to the Lord to take him daily, and he is ready to go.   He states that his daughter wants him to continue to try rehab, and he wants to please his daughter.  He states his son is okay with a comfort focused care.    He does not want a  meeting involving daughter.  He states he has his pastor that comes to see him regularly, and he may ask the pastor to facilitate a conversation between he and his daughter.  Encouraged him to discuss how he truly feels with his daughter as she and his son would be his surrogate decision makers if he becomes unable to make decisions.  Length  of Stay: 2  Current Medications: Scheduled Meds:   (feeding supplement) PROSource Plus  30 mL Oral BID BM   aspirin EC  81 mg Oral BID   calcitRIOL  0.25 mcg Oral Daily   furosemide  60 mg Intravenous BID   guaiFENesin  600 mg Oral BID   heparin injection (subcutaneous)  5,000 Units Subcutaneous Q8H   levothyroxine  75 mcg Oral Q0600   liothyronine  5 mcg Oral Daily   midodrine  10 mg Oral TID WC   multivitamin with minerals  1 tablet Oral Daily   pravastatin  20 mg Oral QPM    Continuous Infusions:   PRN Meds: acetaminophen **OR** acetaminophen, albuterol, ondansetron **OR** ondansetron (ZOFRAN) IV, polyethylene glycol, traZODone  Physical Exam Pulmonary:     Effort: Pulmonary effort is normal.  Neurological:     Mental Status: He is alert.            Vital Signs: BP 104/67 (BP Location: Right Arm)    Pulse 63    Temp 98.1 F (36.7 C)    Resp 17    Ht 6' (1.829 m)    Wt 72.3 kg    SpO2 100%    BMI 21.62 kg/m  SpO2: SpO2: 100 % O2 Device: O2 Device: Nasal Cannula O2 Flow Rate: O2 Flow Rate (L/min): 2 L/min  Intake/output summary:  Intake/Output Summary (Last 24 hours) at 10/27/2021 1040 Last data filed at 10/27/2021 1660 Gross per 24 hour  Intake 240 ml  Output 800 ml  Net -560 ml   LBM:   Baseline Weight: Weight: 70.3 kg Most recent weight: Weight: 72.3 kg    Patient Active Problem List   Diagnosis Date Noted   Schistocytes on peripheral blood smear 10/26/2021   Hypothyroidism 10/26/2021   Thrombocytopenia (HCC) 10/07/2021   AF (paroxysmal atrial fibrillation) (HCC) 10/06/2021   Demand ischemia ([place]) 10/06/2021   Anemia  in other chronic diseases classified elsewhere 10/06/2021   Cardiogenic shock ([place]) 09/28/2021   AKI (acute kidney injury) ([place]) 05/29/2021   Pressure injury of buttock, stage 2 ([place]) 02/01/2021   Acute respiratory failure with hypoxia (HCC) 09/07/2020   Elevated troponin 09/07/2020   Pleural effusion 09/07/2020   Acute on chronic systolic CHF (congestive heart failure) ([place]) 09/07/2020   Thoracic compression fracture ([place]) 09/07/2020   Hyponatremia 08/05/2020   MGUS (monoclonal gammopathy of unknown significance) 05/23/2016   S/P total knee arthroplasty 04/02/2016   CAD in native artery 11/22/2015   TI (tricuspid incompetence) 05/11/2015   Early satiety 01/25/2015   Benign essential HTN 01/03/2015   Abdominal aortic aneurysm (AAA) without rupture 07/09/2014   MI (mitral incompetence) 07/09/2014   Aortic heart valve narrowing 07/09/2014   Cardiomyopathy, ischemic 06/28/2014   Arthritis of knee, degenerative 05/17/2014   Kidney lump 04/09/2013   Benign prostatic hyperplasia with urinary obstruction 03/17/2013   CKD (chronic kidney disease), stage V ([place]) 03/17/2013   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  He does not want a family meeting involving his daughter.  Encouraged him to speak with her to make his wishes known.    Code Status:    Code Status Orders  (From admission, onward)           Start     Ordered   10/26/21 0129  Do not attempt  resuscitation (DNR)  Continuous       Question Answer Comment  In the event of cardiac or respiratory ARREST Do not call a code blue   In the event of cardiac or respiratory ARREST Do not perform Intubation, CPR, defibrillation or ACLS   In the event of cardiac or respiratory ARREST Use medication by any route, position, wound care, and other  measures to relive pain and suffering. May use oxygen, suction and manual treatment of airway obstruction as needed for comfort.   Comments MOST form on chart.      10/26/21 0129           Code Status History     Date Active Date Inactive Code Status Order ID Comments User Context   10/10/2021 1309 10/16/2021 1850 DNR [id number]  Asencion Gowda, NP Inpatient   09/29/2021 1005 10/10/2021 1308 Partial Code [id number]  Flora Lipps, MD Inpatient   09/28/2021 2357 09/29/2021 1004 Full Code [id number]  Lang Snow, NP ED   05/29/2021 2243 06/01/2021 2216 Partial Code [id number]  Cox, Amy N, DO ED   05/29/2021 2202 05/29/2021 2243 Full Code [id number]  Cox, Amy N, DO ED   02/06/2021 1027 02/08/2021 2050 DNR [id number]  Sidney Ace, MD Inpatient   02/06/2021 0909 02/06/2021 1026 Full Code [id number]  Sidney Ace, MD Inpatient   02/02/2021 1003 02/06/2021 0909 DNR [id number]  Sidney Ace, MD Inpatient   01/31/2021 1342 02/02/2021 1002 Full Code [id number]  Artist Beach, MD ED   09/07/2020 0430 09/10/2020 1628 Full Code [id number]  Vianne Bulls, MD ED   08/05/2020 2155 08/10/2020 2321 Full Code [id number]  Collier Bullock, MD ED   05/11/2020 2147 05/17/2020 2241 Full Code [id number]  Clarnce Flock, MD Inpatient   04/02/2016 1640 04/05/2016 1816 Full Code [id number]  Dereck Leep, MD Inpatient   05/19/2015 1121 05/20/2015 0326 Full Code [id number]  Sabino Dick, MD HOV      Advance Directive Documentation    Flowsheet Row Most Recent Value  Type of Advance Directive Out of facility DNR (pink MOST or yellow form)  Pre-existing out of facility DNR order (yellow form or pink MOST form) --  "MOST" Form in Place? --       Prognosis:  < 3 months   Thank you for allowing the Palliative Medicine Team to assist in the care of this patient.   Time In: 9:30 Time Out: 10:20 Total Time 50 min Prolonged Time Billed  no       Greater than 50%  of this time was spent  counseling and coordinating care related to the above assessment and plan.  Asencion Gowda, NP  Please contact Palliative Medicine Team phone at [phone] for questions and concerns.

## 2021-10-27 NOTE — Progress Notes (Signed)
[facility]  ROUNDING NOTE   Subjective:   [name] is a 88 year old with past medical history of hypertension, dyslipidemia, aortic aneurysm, and stage IV CKD.  Patient presents from rehab facility with fluid overloa.  With suspected to be dehydrated and prescribed IV fluids.  Patient states no shortness of breath but bilateral lower extremity and abdominal edema.  Patient has been admitted for Acute pulmonary edema (HCC) [J81.0] Acute CHF (congestive heart failure) ([place]) [I50.9] Urinary tract infection without hematuria, site unspecified [N39.0]  Patient is known to our practice and receives outpatient care from Dr. Holley Raring.    Patient seen resting in bed quietly, alert and oriented Tolerating meals appropriately States respiratory status has improved since admission Reports weakness today   Objective:  Vital signs in last 24 hours:  Temp:  [97.5 F (36.4 C)-98.1 F (36.7 C)] 97.7 F (36.5 C) (02/10 1146) Pulse Rate:  [62-90] 64 (02/10 1146) Resp:  [14-19] 19 (02/10 1146) BP: (90-104)/(63-69) 103/69 (02/10 1146) SpO2:  [97 %-100 %] 100 % (02/10 1146) Weight:  [72.3 kg-74.2 kg] 72.3 kg (02/10 0500)  Weight change: 3.892 kg Filed Weights   10/25/21 2029 10/27/21 0048 10/27/21 0500  Weight: 70.3 kg 74.2 kg 72.3 kg    Intake/Output: I/O last 3 completed shifts: In: 480 [P.O.:480] Out: 800 [Urine:800]   Intake/Output this shift:  Total I/O In: 840 [P.O.:840] Out: 1125 [Urine:1125]  Physical Exam: General: NAD, resting comfortably  Head: Normocephalic, atraumatic. Moist oral mucosal membranes  Eyes: Anicteric  Lungs:  Crackles, normal effort, O2   Heart: Regular rate and rhythm  Abdomen:  Soft, nontender  Extremities:  1+ peripheral edema.  Neurologic: Nonfocal, moving all four extremities  Skin: No lesions       Basic Metabolic Panel: Recent Labs  Lab 10/25/21 2136 10/26/21 0625 10/27/21 0430  NA 133* 136 132*  K 4.5 4.6 4.7  CL 99  100 99  CO2 22 21* 21*  GLUCOSE 103* 95 92  BUN 157* 157* 156*  CREATININE 4.44* 4.34* 4.30*  CALCIUM 8.5* 8.4* 8.5*     Liver Function Tests: Recent Labs  Lab 10/25/21 2136  AST 42*  ALT 61*  ALKPHOS 119  BILITOT 0.9  PROT 7.0  ALBUMIN 3.4*    No results for input(s): LIPASE, AMYLASE in the last 168 hours. No results for input(s): AMMONIA in the last 168 hours.  CBC: Recent Labs  Lab 10/25/21 2136 10/26/21 0625  WBC 5.3 4.9  NEUTROABS 3.5  --   HGB 8.7* 8.5*  HCT 27.4* 26.1*  MCV 111.4* 108.3*  PLT 171 157     Cardiac Enzymes: No results for input(s): CKTOTAL, CKMB, CKMBINDEX, TROPONINI in the last 168 hours.  BNP: Invalid input(s): POCBNP  CBG: No results for input(s): GLUCAP in the last 168 hours.  Microbiology: Results for orders placed or performed during the hospital encounter of 10/25/21  Blood culture (routine x 2)     Status: None (Preliminary result)   Collection Time: 10/25/21 11:41 PM   Specimen: BLOOD  Result Value Ref Range Status   Specimen Description BLOOD LEFT ASSIST CONTROL  Final   Special Requests   Final    BOTTLES DRAWN AEROBIC AND ANAEROBIC Blood Culture adequate volume   Culture   Final    NO GROWTH 1 DAY Performed at [facility], [address] [place], [place] [address]    Report Status PENDING  Incomplete  Resp Panel by RT-PCR (Flu A&B, Covid)  Nasopharyngeal Swab     Status: None   Collection Time: 10/25/21 11:41 PM   Specimen: Nasopharyngeal Swab; Nasopharyngeal(NP) swabs in vial transport medium  Result Value Ref Range Status   SARS Coronavirus 2 by RT PCR NEGATIVE NEGATIVE Final    Comment: (NOTE) SARS-CoV-2 target nucleic acids are NOT DETECTED.  The SARS-CoV-2 RNA is generally detectable in upper respiratory specimens during the acute phase of infection. The lowest concentration of SARS-CoV-2 viral copies this assay can detect is 138 copies/mL. A negative result does not preclude SARS-Cov-2 infection  and should not be used as the sole basis for treatment or other patient management decisions. A negative result may occur with  improper specimen collection/handling, submission of specimen other than nasopharyngeal swab, presence of viral mutation(s) within the areas targeted by this assay, and inadequate number of viral copies(<138 copies/mL). A negative result must be combined with clinical observations, patient history, and epidemiological information. The expected result is Negative.  Fact Sheet for Patients:  EntrepreneurPulse.com.au  Fact Sheet for Healthcare Providers:  IncredibleEmployment.be  This test is no t yet approved or cleared by the [place] FDA and  has been authorized for detection and/or diagnosis of SARS-CoV-2 by FDA under an Emergency Use Authorization (EUA). This EUA will remain  in effect (meaning this test can be used) for the duration of the COVID-19 declaration under Section 564(b)(1) of the Act, 21 U.S.C.section 360bbb-3(b)(1), unless the authorization is terminated  or revoked sooner.       Influenza A by PCR NEGATIVE NEGATIVE Final   Influenza B by PCR NEGATIVE NEGATIVE Final    Comment: (NOTE) The Xpert Xpress SARS-CoV-2/FLU/RSV plus assay is intended as an aid in the diagnosis of influenza from Nasopharyngeal swab specimens and should not be used as a sole basis for treatment. Nasal washings and aspirates are unacceptable for Xpert Xpress SARS-CoV-2/FLU/RSV testing.  Fact Sheet for Patients: EntrepreneurPulse.com.au  Fact Sheet for Healthcare Providers: IncredibleEmployment.be  This test is not yet approved or cleared by the [place] FDA and has been authorized for detection and/or diagnosis of SARS-CoV-2 by FDA under an Emergency Use Authorization (EUA). This EUA will remain in effect (meaning this test can be used) for the duration of the COVID-19 declaration  under Section 564(b)(1) of the Act, 21 U.S.C. section 360bbb-3(b)(1), unless the authorization is terminated or revoked.  Performed at [facility], [address] [place], [place] [address]   Blood culture (routine x 2)     Status: None (Preliminary result)   Collection Time: 10/25/21 11:43 PM   Specimen: BLOOD  Result Value Ref Range Status   Specimen Description BLOOD LEFT ASSIST CONTROL  Final   Special Requests   Final    BOTTLES DRAWN AEROBIC AND ANAEROBIC Blood Culture adequate volume   Culture   Final    NO GROWTH 1 DAY Performed at [facility], [address], [place], [place] [address]    Report Status PENDING  Incomplete    Coagulation Studies: No results for input(s): LABPROT, INR in the last 72 hours.  Urinalysis: Recent Labs    10/25/21 2111  COLORURINE YELLOW*  LABSPEC 1.010  PHURINE 5.0  GLUCOSEU NEGATIVE  HGBUR MODERATE*  BILIRUBINUR NEGATIVE  KETONESUR NEGATIVE  PROTEINUR 30*  NITRITE NEGATIVE  LEUKOCYTESUR LARGE*       Imaging: DG Chest Port 1 View  Result Date: 10/25/2021 CLINICAL DATA:  Shortness of breath EXAM: PORTABLE CHEST 1 VIEW COMPARISON:  10/07/2021, CT 05/19/2021, radiograph 09/30/2021, 07/28/2021,  02/07/2021 FINDINGS: Post sternotomy changes. Valve prosthesis. Cardiomegaly with vascular congestion and mild interstitial edema. Small loculated right pleural effusion. New ground-glass peripheral opacity in the left mid lung. Aortic atherosclerosis. No pneumothorax. Slight increased airspace disease at left base. IMPRESSION: 1. New peripheral airspace disease in the left mid lung with increasing airspace disease at left base suspicious for acute infectious or inflammatory process 2. Cardiomegaly with vascular congestion and probable low-grade edema. There are bilateral effusions which may be slightly loculated on the right Electronically Signed   By: Donavan Foil M.D.   On: 10/25/2021 22:53     Medications:      (feeding supplement) PROSource Plus  30 mL Oral BID BM   aspirin EC  81 mg Oral BID   calcitRIOL  0.25 mcg Oral Daily   furosemide  60 mg Intravenous BID   guaiFENesin  600 mg Oral BID   heparin injection (subcutaneous)  5,000 Units Subcutaneous Q8H   levothyroxine  75 mcg Oral Q0600   liothyronine  5 mcg Oral Daily   midodrine  10 mg Oral TID WC   multivitamin with minerals  1 tablet Oral Daily   pravastatin  20 mg Oral QPM   acetaminophen **OR** acetaminophen, albuterol, ondansetron **OR** ondansetron (ZOFRAN) IV, polyethylene glycol, traZODone  Assessment/ Plan:  [name] is a 88 y.o.  male with past medical history of hypertension, dyslipidemia, aortic aneurysm, and stage IV CKD.  Patient presents from rehab facility with fluid overload.  With suspected to be dehydrated and prescribed IV fluids.  Patient states no shortness of breath but bilateral lower extremity and abdominal edema.  Patient has been admitted for Acute pulmonary edema (HCC) [J81.0] Acute CHF (congestive heart failure) (HCC) [I50.9] Urinary tract infection without hematuria, site unspecified [N39.0]   Acute Kidney Injury on chronic kidney disease stage IV with baseline creatinine 3.54 and GFR of 16 on 10/14/21.  Acute kidney injury secondary to cardiorenal syndrome Chronic kidney disease secondary to hypertension and vascular disease. No IV contrast exposure. No acute indication for dialysis at this time Was considering transition to oral diuresis but will remain on IV furosemide due to basilar crackles.  We will schedule follow-up appointment at discharge.  Lab Results  Component Value Date   CREATININE 4.30 (H) 10/27/2021   CREATININE 4.34 (H) 10/26/2021   CREATININE 4.44 (H) 10/25/2021    Intake/Output Summary (Last 24 hours) at 10/27/2021 1529 Last data filed at 10/27/2021 1455 Gross per 24 hour  Intake 840 ml  Output 1925 ml  Net -1085 ml    2.  Chronic systolic heart failure.  Echo from  10/03/2021 shows EF less than 20% with left ventricular hypokinesis.    LOS: 2 Alvah Gilder 2/10/20233:29 PM

## 2021-10-27 NOTE — Progress Notes (Signed)
°  Progress Note   Patient: [name] ZSW:[id number] DOB: 1931-09-09 DOA: 10/25/2021     2 DOS: the patient was seen and examined on 10/27/2021       Brief hospital course: [name] is an 67 yr. M with CKD V, baseline 3.9, sCHF EF <20% due to ischemic CM, AS and CAD s/p CABG and AVR 2011, pAF not on AC, PVD and recent life-threatening myxedema coma who presented from SNF with abnormal labs.  Patient has been at [facility] for the last 10 days prior to admission, felt like he was "getting better".  Able to get out of bed, more alert.  Last few days he was told that he had "dehydration", so was given IV fluids at the facility.  Then sent to the ER for "dehydration".  In the ER, creatinine up to 4.4, up from 3.5 at discharge.  He had peripheral edema, BNP greater than 4500, and chest x-ray with worsened bilateral airspace opacities.  Started on Lasix and admitted.      Assessment and Plan: * Acute on chronic systolic CHF (congestive heart failure) ([place])- (present on admission) -Continue Lasix, increase dose - Consult Nephrology for assistance with diuretics in setting of cardiorenal syndrome, advanced chronic renal disease  CKD (chronic kidney disease), stage V ([place])- (present on admission) Cr stlightly better after lasix.  Suspect congestion. - Consult nephrology  Hypothyroidism Recently nearly died from Myxedema coma.  Clearly improving from the standpoint of thyroid failure, but overall prognosis is still dire given the underlying pre-existing chronic renal and cardiac failure. -Continue levothyroxine, liothyronine - Check TSH  Schistocytes on peripheral blood smear In setting of AV graft, and without anemia or thrombocytopenia, I have low suspicion for MAHA. - Trend CBC  Anemia in other chronic diseases classified elsewhere- (present on admission) Hgb stable, no clinical bleeding  AF (paroxysmal atrial fibrillation) ([place])- (present on admission) Not on anticoagulation due to  pre-existing factors. -Continue low dose aspirin  Pressure injury of buttock, stage 2 ([place])- (present on admission) - Local wound care  Benign essential HTN- (present on admission) BP low normal -Hold losartan - Continue midodrine -Continue furosemide        Subjective: No new complaints.  Physical Exam: Vitals:   10/27/21 0500 10/27/21 0757 10/27/21 1146 10/27/21 1536  BP:  104/67 103/69 106/70  Pulse:  63 64 64  Resp:  17 19 18   Temp:  98.1 F (36.7 C) 97.7 F (36.5 C) 97.7 F (36.5 C)  TempSrc:   Oral   SpO2:  100% 100% 98%  Weight: 72.3 kg     Height:       Elderly adult male, lying in bed, no acute distress, answers questions appropriately. Crackles in the right anterior lung field, no crackles or wheezes in the posterior lung fields. RRR, no murmurs, no lower extremity edema. JVP elevated. affect blunted, judgment insight appear mildly impaired    Data Reviewed: My review of labs and imaging is notable for creatinine 4.3, no change, sodium 132 Glucose good LDH are normal Haptoglobin pending Hemoglobin stable at 8.4, no white blood cell or platelet count change   Family Communication:   Disposition: Status is: Inpatient Remains inpatient appropriate because: Requires ongoing IV Lasix          Planned Discharge Destination: Skilled nursing facility       Author: Edwin Dada, MD 10/27/2021 5:45 PM  For on call review [email or web address].

## 2021-10-27 NOTE — Care Management Important Message (Signed)
Important Message  Patient Details  Name: [name] MRN: [id number] Date of Birth: 1931-05-22   Medicare Important Message Given:  N/A - LOS <3 / Initial given by admissions     Dannette Barbara 10/27/2021, 9:20 AM

## 2021-10-27 NOTE — Evaluation (Signed)
Physical Therapy Evaluation Patient Details Name: [name] MRN: [id number] DOB: 06-09-1931 Today's Date: 10/27/2021  History of Present Illness  Pt is an 88 y.o male with significant PMH of  CKD stage IV, hypertension, proteinuria, hyperparathyroidism, HLD, aspiration pneumonia, EF 15-20%, ischemic cardiomyopathy, bilateral carotid stenosis, severe aortic stenosis, AAA, MGUS, paroxysmal SVT, CABG, PDA, status post bioprosthetic valve replacement in 2011 who presented to the ED with unresponsiveness. MD assessment includes: Myxedema coma, cadiogenic shock, acute on chronic CHF, AKI, thrombocytopenia, anemia, A-fib, acute respiratory failure, pressure injury of the buttock stage 2, and hyponatremia.   Clinical Impression  Patient tolerated session fairly well and was agreeable to treatment. Upon arrival patient was sitting in recliner. No pain reported from patient. Per patient he came from [facility], however lives in a private resistance alone with friends and family available to help at discharge. Plan is for patient to return to [facility] upon discharge from current acute hospitalization. Patient was on 2L O2 throughout entire session via [place]. HR ranged from 67-83bpm throughout session.   Evaluation was limited as patient fatigues rather quickly with functional activity. Generalized weakness was noted in BUE and BLEs with at least 3/5 strength. Sit to stand transfer from recliner required Min A due to BLE weakness. Patient demonstrated fair balance in standing, heavily relying on RW for stability while nursing tech cleaned patient.  In standing patient demonstrated downward gaze and significant forward flexion. He tolerated ~8 minutes in standing, with 4 steps forward and 4 steps back, before requesting to sit down. Patient would continue to benefit from skilled physical therapy in order to optimize patient's return to PLOF. Recommend STR upon discharge from acute hospitalization.       Recommendations for follow up therapy are one component of a multi-disciplinary discharge planning process, led by the attending physician.  Recommendations may be updated based on patient status, additional functional criteria and insurance authorization.  Follow Up Recommendations Skilled nursing-short term rehab (<3 hours/day)    Assistance Recommended at Discharge Frequent or constant Supervision/Assistance  Patient can return home with the following  Two people to help with walking and/or transfers;A lot of help with bathing/dressing/bathroom;Assistance with cooking/housework;Direct supervision/assist for medications management;Direct supervision/assist for financial management;Assist for transportation    Equipment Recommendations Other (comment) (defer to next level of care)  Recommendations for Other Services       Functional Status Assessment Patient has had a recent decline in their functional status and demonstrates the ability to make significant improvements in function in a reasonable and predictable amount of time.     Precautions / Restrictions Precautions Precautions: Fall Restrictions Weight Bearing Restrictions: No      Mobility  Bed Mobility Overal bed mobility:  (not assessed as patient started and ended session in recliner)                  Transfers Overall transfer level: Needs assistance Equipment used: Rolling walker (2 wheels) Transfers: Sit to/from Stand Sit to Stand: Min assist (from recliner)                Ambulation/Gait Ambulation/Gait assistance: Min guard Gait Distance (Feet): 8 Feet (4 steps forward, 4 steps back) Assistive device: Rolling walker (2 wheels) Gait Pattern/deviations: Step-to pattern, Shuffle, Trunk flexed Gait velocity: decreased     General Gait Details: Decreased activity tolerance this session with pt only able to amb a max of 8' (4 forward, 4 backwards) before fatiguing and requesting to return to  sitting; SpO2  and HR WNL on [place]  Stairs            Wheelchair Mobility    Modified Rankin (Stroke Patients Only)       Balance Overall balance assessment: Needs assistance Sitting-balance support: Feet supported Sitting balance-Leahy Scale: Fair     Standing balance support: Bilateral upper extremity supported, During functional activity Standing balance-Leahy Scale: Fair Standing balance comment: is reliant on RW for dynamic balance                             Pertinent Vitals/Pain Pain Assessment Pain Assessment: No/denies pain    Home Living Family/patient expects to be discharged to:: Private residence Living Arrangements: Alone Available Help at Discharge: Family;Available PRN/intermittently Type of Home: House Home Access: Level entry       Home Layout: One level Home Equipment: Conservation officer, nature (2 wheels);Rollator (4 wheels);Shower seat;Grab bars - tub/shower      Prior Function Prior Level of Function : Independent/Modified Independent             Mobility Comments: Mod Ind amb with a rollator limited community distances, no fall history ADLs Comments: Ind with ADLs, family assists with transportation only     Hand Dominance   Dominant Hand: Right    Extremity/Trunk Assessment   Upper Extremity Assessment Upper Extremity Assessment: Generalized weakness (at least 3/5 strength bilaterally)    Lower Extremity Assessment Lower Extremity Assessment: Generalized weakness (at least 3/5 strength bilaterally)       Communication   Communication: HOH  Cognition Arousal/Alertness: Awake/alert Behavior During Therapy: Flat affect Overall Cognitive Status: No family/caregiver present to determine baseline cognitive functioning                                 General Comments: A&Ox3 - self, location, siuation, patient does state he wants to get stronger        General Comments      Exercises Other Exercises Other  Exercises: Patient educated on role of PT, fall risk, and d/c plans   Assessment/Plan    PT Assessment Patient needs continued PT services  PT Problem List Decreased strength;Decreased activity tolerance;Decreased balance;Decreased mobility;Decreased knowledge of use of DME       PT Treatment Interventions DME instruction;Gait training;Functional mobility training;Therapeutic activities;Therapeutic exercise;Balance training;Patient/family education    PT Goals (Current goals can be found in the Care Plan section)  Acute Rehab PT Goals Patient Stated Goal: get stronger, per patient "I was doing so much better than this" PT Goal Formulation: With patient Time For Goal Achievement: 11/10/21 Potential to Achieve Goals: Good    Frequency Min 2X/week     Co-evaluation               AM-PAC PT "6 Clicks" Mobility  Outcome Measure Help needed turning from your back to your side while in a flat bed without using bedrails?: A Little Help needed moving from lying on your back to sitting on the side of a flat bed without using bedrails?: A Little Help needed moving to and from a bed to a chair (including a wheelchair)?: A Little Help needed standing up from a chair using your arms (e.g., wheelchair or bedside chair)?: A Little Help needed to walk in hospital room?: A Lot Help needed climbing 3-5 steps with a railing? : A Lot 6 Click Score: 16  End of Session Equipment Utilized During Treatment: Gait belt;Oxygen Activity Tolerance: Patient tolerated treatment well Patient left: in chair;with chair alarm set;with call bell/phone within reach Nurse Communication: Mobility status PT Visit Diagnosis: Unsteadiness on feet (R26.81);Difficulty in walking, not elsewhere classified (R26.2);Muscle weakness (generalized) (M62.81)    Time: 8721-5872 PT Time Calculation (min) (ACUTE ONLY): 31 min   Charges:   PT Evaluation $PT Eval Low Complexity: 1 Low PT Treatments $Therapeutic  Activity: 8-22 mins        Iva Boop, PT  10/27/21. 3:01 PM

## 2021-10-28 DIAGNOSIS — I10 Essential (primary) hypertension: Secondary | ICD-10-CM | POA: Diagnosis not present

## 2021-10-28 DIAGNOSIS — I5023 Acute on chronic systolic (congestive) heart failure: Secondary | ICD-10-CM | POA: Diagnosis not present

## 2021-10-28 DIAGNOSIS — D638 Anemia in other chronic diseases classified elsewhere: Secondary | ICD-10-CM | POA: Diagnosis not present

## 2021-10-28 DIAGNOSIS — I480 Paroxysmal atrial fibrillation: Secondary | ICD-10-CM | POA: Diagnosis not present

## 2021-10-28 LAB — COMPREHENSIVE METABOLIC PANEL
ALT: 42 U/L (ref 0–44)
AST: 27 U/L (ref 15–41)
Albumin: 3.1 g/dL — ABNORMAL LOW (ref 3.5–5.0)
Alkaline Phosphatase: 118 U/L (ref 38–126)
Anion gap: 15 (ref 5–15)
BUN: 147 mg/dL — ABNORMAL HIGH (ref 8–23)
CO2: 20 mmol/L — ABNORMAL LOW (ref 22–32)
Calcium: 8.7 mg/dL — ABNORMAL LOW (ref 8.9–10.3)
Chloride: 99 mmol/L (ref 98–111)
Creatinine, Ser: 4.41 mg/dL — ABNORMAL HIGH (ref 0.61–1.24)
GFR, Estimated: 12 mL/min — ABNORMAL LOW (ref >60)
Glucose, Bld: 95 mg/dL (ref 70–99)
Potassium: 4.9 mmol/L (ref 3.5–5.1)
Sodium: 134 mmol/L — ABNORMAL LOW (ref 135–145)
Total Bilirubin: 0.9 mg/dL (ref 0.3–1.2)
Total Protein: 6.7 g/dL (ref 6.5–8.1)

## 2021-10-28 LAB — CBC
HCT: 27.1 % — ABNORMAL LOW (ref 39.0–52.0)
Hemoglobin: 8.7 g/dL — ABNORMAL LOW (ref 13.0–17.0)
MCH: 34.8 pg — ABNORMAL HIGH (ref 26.0–34.0)
MCHC: 32.1 g/dL (ref 30.0–36.0)
MCV: 108.4 fL — ABNORMAL HIGH (ref 80.0–100.0)
Platelets: 165 10*3/uL (ref 150–400)
RBC: 2.5 MIL/uL — ABNORMAL LOW (ref 4.22–5.81)
RDW: 18.7 % — ABNORMAL HIGH (ref 11.5–15.5)
WBC: 5.2 10*3/uL (ref 4.0–10.5)
nRBC: 0.4 % — ABNORMAL HIGH (ref 0.0–0.2)

## 2021-10-28 LAB — HAPTOGLOBIN: Haptoglobin: 123 mg/dL (ref 38–329)

## 2021-10-28 MED ORDER — FUROSEMIDE 10 MG/ML IJ SOLN
120.0000 mg | Freq: Two times a day (BID) | INTRAVENOUS | Status: DC
  Administered 2021-10-28 (×2): 120 mg via INTRAVENOUS
  Filled 2021-10-28 (×4): qty 12

## 2021-10-28 NOTE — NC FL2 (Signed)
[place] LEVEL OF CARE SCREENING TOOL     IDENTIFICATION  Patient Name: [name] Birthdate: 1931/07/21 Sex: male Admission Date (Current Location): 10/25/2021  [facility] and [place] Number:  Engineering geologist and Address:  [facility], [address] [place], [place] [address]      Provider Number: [id number]  Attending Physician Name and Address:  Edwin Dada, *  Relative Name and Phone Number:  [name] (son) [phone]    Current Level of Care: Hospital Recommended Level of Care: [place] Prior Approval Number:    Date Approved/Denied:   PASRR Number: [id number]  Discharge Plan: SNF    Current Diagnoses: Patient Active Problem List   Diagnosis Date Noted   Schistocytes on peripheral blood smear 10/26/2021   Hypothyroidism 10/26/2021   Thrombocytopenia ([place]) 10/07/2021   AF (paroxysmal atrial fibrillation) ([place]) 10/06/2021   Demand ischemia ([place]) 10/06/2021   Anemia in other chronic diseases classified elsewhere 10/06/2021   Cardiogenic shock ([place]) 09/28/2021   AKI (acute kidney injury) ([place]) 05/29/2021   Pressure injury of buttock, stage 2 ([place]) 02/01/2021   Acute respiratory failure with hypoxia ([place]) 09/07/2020   Elevated troponin 09/07/2020   Pleural effusion 09/07/2020   Acute on chronic systolic CHF (congestive heart failure) ([place]) 09/07/2020   Thoracic compression fracture ([place]) 09/07/2020   Hyponatremia 08/05/2020   MGUS (monoclonal gammopathy of unknown significance) 05/23/2016   S/P total knee arthroplasty 04/02/2016   CAD in native artery 11/22/2015   TI (tricuspid incompetence) 05/11/2015   Early satiety 01/25/2015   Benign essential HTN 01/03/2015   Abdominal aortic aneurysm (AAA) without rupture 07/09/2014   MI (mitral incompetence) 07/09/2014   Aortic heart valve narrowing 07/09/2014   Cardiomyopathy, ischemic 06/28/2014   Arthritis of knee, degenerative 05/17/2014    Kidney lump 04/09/2013   Benign prostatic hyperplasia with urinary obstruction 03/17/2013   CKD (chronic kidney disease), stage V ([place]) 03/17/2013   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    Orientation RESPIRATION BLADDER Height & Weight     Self, Time, Situation, Place  O2 (3L O2 nassal cannula) Incontinent, External catheter Weight: 158 lb 8.2 oz (71.9 kg) Height:  6' (182.9 cm)  BEHAVIORAL SYMPTOMS/MOOD NEUROLOGICAL BOWEL NUTRITION STATUS      Incontinent Diet (see discharge summary)  AMBULATORY STATUS COMMUNICATION OF NEEDS Skin   Limited Assist Verbally Other (Comment) (pressure injury stage 2 buttocks)                       Personal Care Assistance Level of Assistance  Bathing, Feeding, Dressing, Total care Bathing Assistance: Limited assistance Feeding assistance: Independent Dressing Assistance: Limited assistance Total Care Assistance: Limited assistance   Functional Limitations Info  Sight, Hearing, Speech Sight Info: Adequate Hearing Info: Adequate Speech Info: Adequate    SPECIAL CARE FACTORS FREQUENCY  PT (By licensed PT), OT (By licensed OT)     PT Frequency: min 5x weekly OT Frequency: min 5x weekly            Contractures Contractures Info: Not present    Additional Factors Info  Code Status, Allergies Code Status Info: DNR Allergies Info: Morphine   Oxycodone   Carvedilol   Cefuroxime Axetil   Iodinated Contrast Media   Lisinopril           Current Medications (10/28/2021):  This is the current hospital active medication list Current Facility-Administered Medications  Medication Dose Route Frequency Provider Last Rate  Last Admin   (feeding supplement) PROSource Plus liquid 30 mL  30 mL Oral BID BM Mansy, Jan A, MD   30 mL at 10/26/21 5625   acetaminophen (TYLENOL) tablet 650 mg  650 mg Oral Q6H PRN Mansy, Jan A, MD       Or   acetaminophen (TYLENOL) suppository 650 mg  650 mg Rectal Q6H PRN Mansy, Jan A, MD        albuterol (PROVENTIL) (2.5 MG/3ML) 0.083% nebulizer solution 2.5 mg  2.5 mg Nebulization Q4H PRN Danford, Suann Larry, MD       aspirin EC tablet 81 mg  81 mg Oral BID Mansy, Jan A, MD   81 mg at 10/27/21 2119   calcitRIOL (ROCALTROL) capsule 0.25 mcg  0.25 mcg Oral Daily Mansy, Jan A, MD   0.25 mcg at 10/27/21 0901   furosemide (LASIX) 120 mg in dextrose 5 % 50 mL IVPB  120 mg Intravenous BID Edwin Dada, MD       guaiFENesin (MUCINEX) 12 hr tablet 600 mg  600 mg Oral BID Mansy, Jan A, MD   600 mg at 10/27/21 2119   heparin injection 5,000 Units  5,000 Units Subcutaneous Q8H Dorothe Pea, RPH   5,000 Units at 10/28/21 6389   levothyroxine (SYNTHROID) tablet 75 mcg  75 mcg Oral Q0600 Mansy, Jan A, MD   75 mcg at 10/28/21 0617   liothyronine (CYTOMEL) tablet 5 mcg  5 mcg Oral Daily Mansy, Jan A, MD   5 mcg at 10/27/21 0900   midodrine (PROAMATINE) tablet 10 mg  10 mg Oral TID WC Mansy, Jan A, MD   10 mg at 10/27/21 1742   multivitamin with minerals tablet 1 tablet  1 tablet Oral Daily Mansy, Jan A, MD   1 tablet at 10/27/21 0900   ondansetron (ZOFRAN) tablet 4 mg  4 mg Oral Q6H PRN Mansy, Jan A, MD       Or   ondansetron [facility]) injection 4 mg  4 mg Intravenous Q6H PRN Mansy, Jan A, MD       polyethylene glycol (MIRALAX / GLYCOLAX) packet 17 g  17 g Oral Daily PRN Dorothe Pea, RPH       pravastatin (PRAVACHOL) tablet 20 mg  20 mg Oral QPM Mansy, Jan A, MD   20 mg at 10/27/21 1742   traZODone (DESYREL) tablet 25 mg  25 mg Oral QHS PRN Mansy, Arvella Merles, MD         Discharge Medications: Please see discharge summary for a list of discharge medications.  Relevant Imaging Results:  Relevant Lab Results:   Additional Information [phone]  Alberteen Sam, LCSW

## 2021-10-28 NOTE — Progress Notes (Signed)
Progress Note   Patient: [name] INO:[id number] DOB: September 08, 1931 DOA: 10/25/2021     3 DOS: the patient was seen and examined on 10/28/2021       Brief hospital course: [name] is an 37 yr. M with CKD V, baseline 3.9, sCHF EF <20% due to ischemic CM, AS and CAD s/p CABG and AVR 2011, pAF not on AC, PVD and recent life-threatening myxedema coma who presented from SNF with abnormal labs.  Patient has been at [facility] for the last 10 days prior to admission, felt like he was "getting better".  Able to get out of bed, more alert.  Last few days he was told that he had "dehydration", so was given IV fluids at the facility.  Then sent to the ER for "dehydration".  In the ER, creatinine up to 4.4, up from 3.5 at discharge.  He had peripheral edema, BNP greater than 4500, and chest x-ray with worsened bilateral airspace opacities.  Started on Lasix and admitted.      Assessment and Plan: Assessment and Plan: * Acute on chronic systolic CHF (congestive heart failure) ([place])- (present on admission) UOP 1562ml yesterday, much better but still net even and no improvement in renal congestion  - Continue Lasix, increase dose - Consult Nephrology, appreciate expertise       CKD (chronic kidney disease), stage V ([place])- (present on admission) Cr no change today.  Suspect congestion.  Patient has declined dialysis prior to now and again now.  This is reasonable.  At this time, his renal failure will have to be managed medically with diuretics, bicarb, etc. - Consult nephrology  Hypothyroidism Recently nearly died from Myxedema coma.  Clearly improving from the standpoint of thyroid failure, but overall prognosis is still dire given the underlying pre-existing chronic renal and cardiac failure.  TSH improving as expected. -Continue levothyroxine, liothyronine  Schistocytes on peripheral blood smear In setting of AV graft, and without anemia or thrombocytopenia, I have low suspicion for  MAHA. - Trend CBC  Anemia in other chronic diseases classified elsewhere- (present on admission) Hgb stable, no clinical bleeding  AF (paroxysmal atrial fibrillation) ([place])- (present on admission) Not on anticoagulation due to pre-existing factors. -Continue low dose aspirin  Pressure injury of buttock, stage 2 ([place])- (present on admission) - Local wound care  Benign essential HTN- (present on admission) BP low normal - Hold losartan - Continue midodrine - Continue furosemide             Subjective: Feeling well, still weak but energy is good and improving.  No dyspnea.  No swelling.  No confusion.  No fever. Physical Exam: Vitals:   10/28/21 0436 10/28/21 0437 10/28/21 0807 10/28/21 1207  BP:  103/66 101/60 96/69  Pulse:  (!) 58 63 63  Resp:  18 17 20   Temp:  (!) 97.5 F (36.4 C) 97.9 F (36.6 C) 97.6 F (36.4 C)  TempSrc:      SpO2:  100% 98% 99%  Weight: 71.9 kg     Height:       Elderly adult male, sitting up in recliner, no acute distress, watching television. Crackles in anterior lung fields, some crackles in the posterior fields. RRR, no murmurs, trace lower extremity edema, no JVD Affect blunted, judgment insight fair.      Discussed with nephrology Review of labs and imaging is notable for patient metabolic panel with normal electrolytes, creatinine 4.4, no change LFTs unchanged Hemoglobin 8.7, no change from previous, white blood cells  normal Data Reviewed: My review of labs and imaging is notable for creatinine 4.3, no change, sodium 132 Glucose good LDH are normal Haptoglobin pending Hemoglobin stable at 8.4, no white blood cell or platelet count change   Family Communication:   Disposition: Status is: Inpatient Remains inpatient appropriate because: Requires on going IV          Planned Discharge Destination: Skilled nursing facility       Author: Edwin Dada, MD 10/28/2021 4:21 PM  For on call review  [email or web address].

## 2021-10-28 NOTE — Progress Notes (Signed)
[facility]  ROUNDING NOTE   Subjective:   [name] is a 88 year old with past medical history of hypertension, dyslipidemia, aortic aneurysm, and stage IV CKD.  Patient presents from rehab facility with fluid overloa.  With suspected to be dehydrated and prescribed IV fluids.  Patient states no shortness of breath but bilateral lower extremity and abdominal edema.  Patient has been admitted for Acute pulmonary edema (HCC) [J81.0] Acute CHF (congestive heart failure) ([place]) [I50.9] Urinary tract infection without hematuria, site unspecified [N39.0]  Patient is known to our practice and receives outpatient care from Dr. Holley Raring.    Patient states he feels better today Denies shortness of breath States he was able to sit in chair yesterday, reported shortness of breath with exertion. Tolerating meals without nausea  Creatinine 4.41 Urine output of 1.4L in 24 hours Sodium 134   Objective:  Vital signs in last 24 hours:  Temp:  [97.5 F (36.4 C)-97.9 F (36.6 C)] 97.9 F (36.6 C) (02/11 0807) Pulse Rate:  [57-64] 63 (02/11 0807) Resp:  [17-20] 17 (02/11 0807) BP: (98-106)/(60-71) 101/60 (02/11 0807) SpO2:  [98 %-100 %] 98 % (02/11 0807) Weight:  [71.9 kg] 71.9 kg (02/11 0436)  Weight change: -2.3 kg Filed Weights   10/27/21 0048 10/27/21 0500 10/28/21 0436  Weight: 74.2 kg 72.3 kg 71.9 kg    Intake/Output: I/O last 3 completed shifts: In: 1080 [P.O.:1080] Out: 2225 [Urine:2225]   Intake/Output this shift:  Total I/O In: 480 [P.O.:480] Out: -   Physical Exam: General: NAD, resting comfortably  Head: Normocephalic, atraumatic. Moist oral mucosal membranes  Eyes: Anicteric  Lungs:  Crackles, normal effort, O2   Heart: Regular rate and rhythm  Abdomen:  Soft, nontender  Extremities:  1+ peripheral edema.  Neurologic: Nonfocal, moving all four extremities  Skin: No lesions       Basic Metabolic Panel: Recent Labs  Lab 10/25/21 2136  10/26/21 0625 10/27/21 0430 10/28/21 0500  NA 133* 136 132* 134*  K 4.5 4.6 4.7 4.9  CL 99 100 99 99  CO2 22 21* 21* 20*  GLUCOSE 103* 95 92 95  BUN 157* 157* 156* 147*  CREATININE 4.44* 4.34* 4.30* 4.41*  CALCIUM 8.5* 8.4* 8.5* 8.7*     Liver Function Tests: Recent Labs  Lab 10/25/21 2136 10/28/21 0500  AST 42* 27  ALT 61* 42  ALKPHOS 119 118  BILITOT 0.9 0.9  PROT 7.0 6.7  ALBUMIN 3.4* 3.1*    No results for input(s): LIPASE, AMYLASE in the last 168 hours. No results for input(s): AMMONIA in the last 168 hours.  CBC: Recent Labs  Lab 10/25/21 2136 10/26/21 0625 10/28/21 0500  WBC 5.3 4.9 5.2  NEUTROABS 3.5  --   --   HGB 8.7* 8.5* 8.7*  HCT 27.4* 26.1* 27.1*  MCV 111.4* 108.3* 108.4*  PLT 171 157 165     Cardiac Enzymes: No results for input(s): CKTOTAL, CKMB, CKMBINDEX, TROPONINI in the last 168 hours.  BNP: Invalid input(s): POCBNP  CBG: No results for input(s): GLUCAP in the last 168 hours.  Microbiology: Results for orders placed or performed during the hospital encounter of 10/25/21  Blood culture (routine x 2)     Status: None (Preliminary result)   Collection Time: 10/25/21 11:41 PM   Specimen: BLOOD  Result Value Ref Range Status   Specimen Description BLOOD LEFT ASSIST CONTROL  Final   Special Requests   Final    BOTTLES DRAWN AEROBIC AND  ANAEROBIC Blood Culture adequate volume   Culture   Final    NO GROWTH 2 DAYS Performed at [facility], [address] [place], [place] [address]    Report Status PENDING  Incomplete  Resp Panel by RT-PCR (Flu A&B, Covid) Nasopharyngeal Swab     Status: None   Collection Time: 10/25/21 11:41 PM   Specimen: Nasopharyngeal Swab; Nasopharyngeal(NP) swabs in vial transport medium  Result Value Ref Range Status   SARS Coronavirus 2 by RT PCR NEGATIVE NEGATIVE Final    Comment: (NOTE) SARS-CoV-2 target nucleic acids are NOT DETECTED.  The SARS-CoV-2 RNA is generally detectable in upper  respiratory specimens during the acute phase of infection. The lowest concentration of SARS-CoV-2 viral copies this assay can detect is 138 copies/mL. A negative result does not preclude SARS-Cov-2 infection and should not be used as the sole basis for treatment or other patient management decisions. A negative result may occur with  improper specimen collection/handling, submission of specimen other than nasopharyngeal swab, presence of viral mutation(s) within the areas targeted by this assay, and inadequate number of viral copies(<138 copies/mL). A negative result must be combined with clinical observations, patient history, and epidemiological information. The expected result is Negative.  Fact Sheet for Patients:  EntrepreneurPulse.com.au  Fact Sheet for Healthcare Providers:  IncredibleEmployment.be  This test is no t yet approved or cleared by the [place] FDA and  has been authorized for detection and/or diagnosis of SARS-CoV-2 by FDA under an Emergency Use Authorization (EUA). This EUA will remain  in effect (meaning this test can be used) for the duration of the COVID-19 declaration under Section 564(b)(1) of the Act, 21 U.S.C.section 360bbb-3(b)(1), unless the authorization is terminated  or revoked sooner.       Influenza A by PCR NEGATIVE NEGATIVE Final   Influenza B by PCR NEGATIVE NEGATIVE Final    Comment: (NOTE) The Xpert Xpress SARS-CoV-2/FLU/RSV plus assay is intended as an aid in the diagnosis of influenza from Nasopharyngeal swab specimens and should not be used as a sole basis for treatment. Nasal washings and aspirates are unacceptable for Xpert Xpress SARS-CoV-2/FLU/RSV testing.  Fact Sheet for Patients: EntrepreneurPulse.com.au  Fact Sheet for Healthcare Providers: IncredibleEmployment.be  This test is not yet approved or cleared by the [place] FDA and has been  authorized for detection and/or diagnosis of SARS-CoV-2 by FDA under an Emergency Use Authorization (EUA). This EUA will remain in effect (meaning this test can be used) for the duration of the COVID-19 declaration under Section 564(b)(1) of the Act, 21 U.S.C. section 360bbb-3(b)(1), unless the authorization is terminated or revoked.  Performed at [facility], [address] [place], [place] [address]   Blood culture (routine x 2)     Status: None (Preliminary result)   Collection Time: 10/25/21 11:43 PM   Specimen: BLOOD  Result Value Ref Range Status   Specimen Description BLOOD LEFT ASSIST CONTROL  Final   Special Requests   Final    BOTTLES DRAWN AEROBIC AND ANAEROBIC Blood Culture adequate volume   Culture   Final    NO GROWTH 2 DAYS Performed at [facility], [address], [place], [place] [address]    Report Status PENDING  Incomplete    Coagulation Studies: No results for input(s): LABPROT, INR in the last 72 hours.  Urinalysis: Recent Labs    10/25/21 2111  COLORURINE YELLOW*  LABSPEC 1.010  PHURINE 5.0  GLUCOSEU NEGATIVE  HGBUR MODERATE*  BILIRUBINUR NEGATIVE  KETONESUR NEGATIVE  PROTEINUR 30*  NITRITE NEGATIVE  LEUKOCYTESUR LARGE*       Imaging: No results found.   Medications:    furosemide 120 mg (10/28/21 1024)    (feeding supplement) PROSource Plus  30 mL Oral BID BM   aspirin EC  81 mg Oral BID   calcitRIOL  0.25 mcg Oral Daily   guaiFENesin  600 mg Oral BID   heparin injection (subcutaneous)  5,000 Units Subcutaneous Q8H   levothyroxine  75 mcg Oral Q0600   liothyronine  5 mcg Oral Daily   midodrine  10 mg Oral TID WC   multivitamin with minerals  1 tablet Oral Daily   pravastatin  20 mg Oral QPM   acetaminophen **OR** acetaminophen, albuterol, ondansetron **OR** ondansetron (ZOFRAN) IV, polyethylene glycol, traZODone  Assessment/ Plan:  [name] is a 88 y.o.  male with past medical history of  hypertension, dyslipidemia, aortic aneurysm, and stage IV CKD.  Patient presents from rehab facility with fluid overload.  With suspected to be dehydrated and prescribed IV fluids.  Patient states no shortness of breath but bilateral lower extremity and abdominal edema.  Patient has been admitted for Acute pulmonary edema (HCC) [J81.0] Acute CHF (congestive heart failure) (HCC) [I50.9] Urinary tract infection without hematuria, site unspecified [N39.0]   Acute Kidney Injury on chronic kidney disease stage IV with baseline creatinine 3.54 and GFR of 16 on 10/14/21.  Acute kidney injury secondary to cardiorenal syndrome Chronic kidney disease secondary to hypertension and vascular disease. No IV contrast exposure. No acute indication for dialysis at this time Will attempt increased diuresis with Furosemide 120mg  IV today to improve respiratory status. Considered contacting Cardiology to evaluate patient for HD access, but patient not interested in dialysis. He states he wants to live out whatever time he has left. Will attempt to improve patient's respiratory status with diuresis. Recommend palliative consult.   Lab Results  Component Value Date   CREATININE 4.41 (H) 10/28/2021   CREATININE 4.30 (H) 10/27/2021   CREATININE 4.34 (H) 10/26/2021    Intake/Output Summary (Last 24 hours) at 10/28/2021 1129 Last data filed at 10/28/2021 1054 Gross per 24 hour  Intake 1200 ml  Output 1425 ml  Net -225 ml    2.  Chronic systolic heart failure.  Echo from 10/03/2021 shows EF less than 20% with left ventricular hypokinesis.     LOS: 3 [place] 2/11/202311:29 AM

## 2021-10-29 DIAGNOSIS — N19 Unspecified kidney failure: Secondary | ICD-10-CM

## 2021-10-29 DIAGNOSIS — I5023 Acute on chronic systolic (congestive) heart failure: Secondary | ICD-10-CM | POA: Diagnosis not present

## 2021-10-29 LAB — BASIC METABOLIC PANEL
Anion gap: 13 (ref 5–15)
BUN: 143 mg/dL — ABNORMAL HIGH (ref 8–23)
CO2: 20 mmol/L — ABNORMAL LOW (ref 22–32)
Calcium: 8.5 mg/dL — ABNORMAL LOW (ref 8.9–10.3)
Chloride: 96 mmol/L — ABNORMAL LOW (ref 98–111)
Creatinine, Ser: 4.17 mg/dL — ABNORMAL HIGH (ref 0.61–1.24)
GFR, Estimated: 13 mL/min — ABNORMAL LOW (ref >60)
Glucose, Bld: 103 mg/dL — ABNORMAL HIGH (ref 70–99)
Potassium: 4.7 mmol/L (ref 3.5–5.1)
Sodium: 129 mmol/L — ABNORMAL LOW (ref 135–145)

## 2021-10-29 MED ORDER — TORSEMIDE 20 MG PO TABS
100.0000 mg | ORAL_TABLET | Freq: Every day | ORAL | Status: DC
  Administered 2021-10-29 – 2021-10-31 (×3): 100 mg via ORAL
  Filled 2021-10-29 (×3): qty 5

## 2021-10-29 NOTE — Progress Notes (Deleted)
Patient has a history of multiple falls at home. External urinary catheter placed.

## 2021-10-29 NOTE — Progress Notes (Addendum)
Progress Note   Patient: [name] SWH:[id number] DOB: Dec 09, 1930 DOA: 10/25/2021     4 DOS: the patient was seen and examined on 10/29/2021       Brief hospital course: [name] is an 17 yr. M with CKD V, baseline 3.9, sCHF EF <20% due to ischemic CM, AS and CAD s/p CABG and AVR 2011, pAF not on AC, PVD and recent life-threatening myxedema coma who presented from SNF with abnormal labs.  Patient has been at [facility] for the last 10 days prior to admission, felt like he was "getting better".  Able to get out of bed, more alert.  Last few days he was told that he had "dehydration", so was given IV fluids at the facility.  Then sent to the ER for "dehydration".  In the ER, creatinine up to 4.4, up from 3.5 at discharge.  He had peripheral edema, BNP greater than 4500, and chest x-ray with worsened bilateral airspace opacities.  Started on Lasix and admitted.       Assessment and Plan: * Acute on chronic systolic CHF (congestive heart failure) ([place])- (present on admission) UOP better again yesterday, net -600, 1.3 L on admission, creatinine improving with increased dose of diuretics yesterday  - Continue diuretics, transition to high-dose torsemide - Consult Nephrology, appreciate expertise      CKD (chronic kidney disease), stage V ([place])- (present on admission) Creatinine improved.  Suspect congestion.  Patient has declined dialysis prior to now and again now.    I suspect that this fluid overload can be managed with high-dose diuretics and acidosis with bicarb.  Unfortunately his BUN is up to 140s and he appears to be starting to get symptomatic with itching and sleepiness and I suspect that this will be a life limiting factor for him if he does not do dialysis. - Consult nephrology - Consult Palliative Care  Uremia See above     Hypothyroidism Recently nearly died from Myxedema coma.  Clearly improving from the standpoint of thyroid failure, but overall prognosis is  still dire given the underlying pre-existing chronic renal and cardiac failure.  TSH improving as expected. -Continue levothyroxine, liothyronine    Anemia in other chronic diseases classified elsewhere- (present on admission) Hgb stable, no clinical bleeding  AF (paroxysmal atrial fibrillation) ([place])- (present on admission) Not on anticoagulation due to pre-existing factors. -Continue low dose aspirin    Benign essential HTN- (present on admission) BP low normal - Hold losartan - Continue midodrine - Continue furosemide             Subjective: Sleepy, itchy.  Depressed.  No confusion, fever, sputum, chest pain Physical Exam: Vitals:   10/29/21 0745 10/29/21 1226 10/29/21 1230 10/29/21 1457  BP: 98/65 111/71  103/64  Pulse: 61 60  64  Resp: 17 (!) 22  20  Temp: 97.9 F (36.6 C)   97.7 F (36.5 C)  TempSrc: Oral   Oral  SpO2: 95% (!) 88% 94% 95%  Weight:      Height:       Elderly adult male, sitting up in recliner, watching [name]'s iPad Crackles in anterior lung fields, lung sounds clear in the posterior fields, good air movement, respiratory effort normal JVP elevated while sitting upright, no lower extremity edema, no systolic murmur Abdominal exam soft, no tenderness Mentation slowed, sleepy, moderate psychomotor slowing.         Data Reviewed: This with nephrology My review of labs and imaging is notable for creatinine down to  4.1, sodium down to 129, markedly elevated BUN Hemogram shows hemoglobin 8.7, no change from previous     Family Communication: son by phone  Disposition: Status is: Inpatient Remains inpatient appropriate because: He has advanced cardiorenal syndrome.  We will transition to oral diuretics today, if his renal function and urine output are adequate with high-dose oral diuretics, will discharge tomorrow or the next          Planned Discharge Destination: Skilled nursing facility       Author: Edwin Dada, MD 10/29/2021 3:38 PM  For on call review [email or web address].

## 2021-10-29 NOTE — Progress Notes (Signed)
36 yom DNR with acute on chronic CHF with EF less than 20%. Patient is on IV Lasix twice a day. Stage 4 Kidney disease and Nephrology is following. Palliative consult placed. Alert and oriented by 4 this shift. Stage 2 Sacral ulcer. Skin assessed under foam. On 3L [place].  Vital signs are stable this shift.

## 2021-10-29 NOTE — Progress Notes (Signed)
[facility]  ROUNDING NOTE   Subjective:   [name] is a 88 year old with past medical history of hypertension, dyslipidemia, aortic aneurysm, and stage IV CKD.  Patient presents from rehab facility with fluid overloa.  With suspected to be dehydrated and prescribed IV fluids.  Patient states no shortness of breath but bilateral lower extremity and abdominal edema.  Patient has been admitted for Acute pulmonary edema (HCC) [J81.0] Acute CHF (congestive heart failure) ([place]) [I50.9] Urinary tract infection without hematuria, site unspecified [N39.0]  Patient is known to our practice and receives outpatient care from Dr. Holley Raring.    Patient seen sitting up in bed Completed breakfast tray at bedside Tolerating meals without nausea and vomiting Denies shortness of breath, remains on 3L [place]  Creatinine 4.17 Urine output recorded of 1.7L in 24 hours Sodium 129  Objective:  Vital signs in last 24 hours:  Temp:  [97.6 F (36.4 C)-98.4 F (36.9 C)] 97.9 F (36.6 C) (02/12 0745) Pulse Rate:  [58-64] 61 (02/12 0745) Resp:  [16-20] 17 (02/12 0745) BP: (96-113)/(65-81) 98/65 (02/12 0745) SpO2:  [94 %-99 %] 95 % (02/12 0745) Weight:  [75.8 kg] 75.8 kg (02/12 0500)  Weight change: 3.9 kg Filed Weights   10/27/21 0500 10/28/21 0436 10/29/21 0500  Weight: 72.3 kg 71.9 kg 75.8 kg    Intake/Output: I/O last 3 completed shifts: In: 1022.3 [P.O.:960; IV Piggyback:62.3] Out: 2000 [Urine:2000]   Intake/Output this shift:  Total I/O In: 480 [P.O.:480] Out: 700 [Urine:700]  Physical Exam: General: NAD, withdrawn  Head: Normocephalic, atraumatic. Moist oral mucosal membranes  Eyes: Anicteric  Lungs:  Crackles, normal effort, O2   Heart: Regular rate and rhythm  Abdomen:  Soft, nontender  Extremities:  1+ peripheral edema.  Neurologic: Nonfocal, moving all four extremities  Skin: No lesions       Basic Metabolic Panel: Recent Labs  Lab 10/25/21 2136  10/26/21 0625 10/27/21 0430 10/28/21 0500 10/29/21 0435  NA 133* 136 132* 134* 129*  K 4.5 4.6 4.7 4.9 4.7  CL 99 100 99 99 96*  CO2 22 21* 21* 20* 20*  GLUCOSE 103* 95 92 95 103*  BUN 157* 157* 156* 147* 143*  CREATININE 4.44* 4.34* 4.30* 4.41* 4.17*  CALCIUM 8.5* 8.4* 8.5* 8.7* 8.5*     Liver Function Tests: Recent Labs  Lab 10/25/21 2136 10/28/21 0500  AST 42* 27  ALT 61* 42  ALKPHOS 119 118  BILITOT 0.9 0.9  PROT 7.0 6.7  ALBUMIN 3.4* 3.1*    No results for input(s): LIPASE, AMYLASE in the last 168 hours. No results for input(s): AMMONIA in the last 168 hours.  CBC: Recent Labs  Lab 10/25/21 2136 10/26/21 0625 10/28/21 0500  WBC 5.3 4.9 5.2  NEUTROABS 3.5  --   --   HGB 8.7* 8.5* 8.7*  HCT 27.4* 26.1* 27.1*  MCV 111.4* 108.3* 108.4*  PLT 171 157 165     Cardiac Enzymes: No results for input(s): CKTOTAL, CKMB, CKMBINDEX, TROPONINI in the last 168 hours.  BNP: Invalid input(s): POCBNP  CBG: No results for input(s): GLUCAP in the last 168 hours.  Microbiology: Results for orders placed or performed during the hospital encounter of 10/25/21  Blood culture (routine x 2)     Status: None (Preliminary result)   Collection Time: 10/25/21 11:41 PM   Specimen: BLOOD  Result Value Ref Range Status   Specimen Description BLOOD LEFT ASSIST CONTROL  Final   Special Requests   Final  BOTTLES DRAWN AEROBIC AND ANAEROBIC Blood Culture adequate volume   Culture   Final    NO GROWTH 3 DAYS Performed at [facility], [address] [place], [place] [address]    Report Status PENDING  Incomplete  Resp Panel by RT-PCR (Flu A&B, Covid) Nasopharyngeal Swab     Status: None   Collection Time: 10/25/21 11:41 PM   Specimen: Nasopharyngeal Swab; Nasopharyngeal(NP) swabs in vial transport medium  Result Value Ref Range Status   SARS Coronavirus 2 by RT PCR NEGATIVE NEGATIVE Final    Comment: (NOTE) SARS-CoV-2 target nucleic acids are NOT  DETECTED.  The SARS-CoV-2 RNA is generally detectable in upper respiratory specimens during the acute phase of infection. The lowest concentration of SARS-CoV-2 viral copies this assay can detect is 138 copies/mL. A negative result does not preclude SARS-Cov-2 infection and should not be used as the sole basis for treatment or other patient management decisions. A negative result may occur with  improper specimen collection/handling, submission of specimen other than nasopharyngeal swab, presence of viral mutation(s) within the areas targeted by this assay, and inadequate number of viral copies(<138 copies/mL). A negative result must be combined with clinical observations, patient history, and epidemiological information. The expected result is Negative.  Fact Sheet for Patients:  EntrepreneurPulse.com.au  Fact Sheet for Healthcare Providers:  IncredibleEmployment.be  This test is no t yet approved or cleared by the [place] FDA and  has been authorized for detection and/or diagnosis of SARS-CoV-2 by FDA under an Emergency Use Authorization (EUA). This EUA will remain  in effect (meaning this test can be used) for the duration of the COVID-19 declaration under Section 564(b)(1) of the Act, 21 U.S.C.section 360bbb-3(b)(1), unless the authorization is terminated  or revoked sooner.       Influenza A by PCR NEGATIVE NEGATIVE Final   Influenza B by PCR NEGATIVE NEGATIVE Final    Comment: (NOTE) The Xpert Xpress SARS-CoV-2/FLU/RSV plus assay is intended as an aid in the diagnosis of influenza from Nasopharyngeal swab specimens and should not be used as a sole basis for treatment. Nasal washings and aspirates are unacceptable for Xpert Xpress SARS-CoV-2/FLU/RSV testing.  Fact Sheet for Patients: EntrepreneurPulse.com.au  Fact Sheet for Healthcare Providers: IncredibleEmployment.be  This test is not yet  approved or cleared by the [place] FDA and has been authorized for detection and/or diagnosis of SARS-CoV-2 by FDA under an Emergency Use Authorization (EUA). This EUA will remain in effect (meaning this test can be used) for the duration of the COVID-19 declaration under Section 564(b)(1) of the Act, 21 U.S.C. section 360bbb-3(b)(1), unless the authorization is terminated or revoked.  Performed at [facility], [address] [place], [place] [address]   Blood culture (routine x 2)     Status: None (Preliminary result)   Collection Time: 10/25/21 11:43 PM   Specimen: BLOOD  Result Value Ref Range Status   Specimen Description BLOOD LEFT ASSIST CONTROL  Final   Special Requests   Final    BOTTLES DRAWN AEROBIC AND ANAEROBIC Blood Culture adequate volume   Culture   Final    NO GROWTH 3 DAYS Performed at [facility], [address] [place], [place] [address]    Report Status PENDING  Incomplete    Coagulation Studies: No results for input(s): LABPROT, INR in the last 72 hours.  Urinalysis: No results for input(s): COLORURINE, LABSPEC, PHURINE, GLUCOSEU, HGBUR, BILIRUBINUR, KETONESUR, PROTEINUR, UROBILINOGEN, NITRITE, LEUKOCYTESUR in the last 72 hours.  Invalid  input(s): APPERANCEUR     Imaging: No results found.   Medications:      (feeding supplement) PROSource Plus  30 mL Oral BID BM   aspirin EC  81 mg Oral BID   calcitRIOL  0.25 mcg Oral Daily   guaiFENesin  600 mg Oral BID   heparin injection (subcutaneous)  5,000 Units Subcutaneous Q8H   levothyroxine  75 mcg Oral Q0600   liothyronine  5 mcg Oral Daily   midodrine  10 mg Oral TID WC   multivitamin with minerals  1 tablet Oral Daily   pravastatin  20 mg Oral QPM   torsemide  100 mg Oral Daily   acetaminophen **OR** acetaminophen, albuterol, ondansetron **OR** ondansetron (ZOFRAN) IV, polyethylene glycol, traZODone  Assessment/ Plan:  [name] is a 88 y.o.  male with  past medical history of hypertension, dyslipidemia, aortic aneurysm, and stage IV CKD.  Patient presents from rehab facility with fluid overload.  With suspected to be dehydrated and prescribed IV fluids.  Patient states no shortness of breath but bilateral lower extremity and abdominal edema.  Patient has been admitted for Acute pulmonary edema (HCC) [J81.0] Acute CHF (congestive heart failure) (HCC) [I50.9] Urinary tract infection without hematuria, site unspecified [N39.0]   Acute Kidney Injury with hyponatremia on chronic kidney disease stage IV with baseline creatinine 3.54 and GFR of 16 on 10/14/21.  Acute kidney injury secondary to cardiorenal syndrome Chronic kidney disease secondary to hypertension and vascular disease. No IV contrast exposure. No acute indication for dialysis at this time -No longer interested in dialysis - Will transition to oral diuretics, Torsemide 100mg  daily - despite diuresis, sodium continues to decrease. Will continue to diuretic and initiate 1241ml fluid restriction. - Will continue monitoring   Lab Results  Component Value Date   CREATININE 4.17 (H) 10/29/2021   CREATININE 4.41 (H) 10/28/2021   CREATININE 4.30 (H) 10/27/2021    Intake/Output Summary (Last 24 hours) at 10/29/2021 1104 Last data filed at 10/29/2021 1045 Gross per 24 hour  Intake 1022.25 ml  Output 2400 ml  Net -1377.75 ml    2.  Chronic systolic heart failure.  Echo from 10/03/2021 shows EF less than 20% with left ventricular hypokinesis.       LOS: 4 [place] 2/12/202311:04 AM

## 2021-10-29 NOTE — Assessment & Plan Note (Signed)
See above

## 2021-10-29 NOTE — Plan of Care (Signed)

## 2021-10-30 DIAGNOSIS — I480 Paroxysmal atrial fibrillation: Secondary | ICD-10-CM | POA: Diagnosis not present

## 2021-10-30 DIAGNOSIS — D638 Anemia in other chronic diseases classified elsewhere: Secondary | ICD-10-CM | POA: Diagnosis not present

## 2021-10-30 DIAGNOSIS — I10 Essential (primary) hypertension: Secondary | ICD-10-CM | POA: Diagnosis not present

## 2021-10-30 DIAGNOSIS — Z7189 Other specified counseling: Secondary | ICD-10-CM | POA: Diagnosis not present

## 2021-10-30 DIAGNOSIS — I5023 Acute on chronic systolic (congestive) heart failure: Secondary | ICD-10-CM | POA: Diagnosis not present

## 2021-10-30 LAB — BASIC METABOLIC PANEL
Anion gap: 15 (ref 5–15)
BUN: 99 mg/dL — ABNORMAL HIGH (ref 8–23)
CO2: 21 mmol/L — ABNORMAL LOW (ref 22–32)
Calcium: 8.8 mg/dL — ABNORMAL LOW (ref 8.9–10.3)
Chloride: 95 mmol/L — ABNORMAL LOW (ref 98–111)
Creatinine, Ser: 4.09 mg/dL — ABNORMAL HIGH (ref 0.61–1.24)
GFR, Estimated: 13 mL/min — ABNORMAL LOW (ref >60)
Glucose, Bld: 99 mg/dL (ref 70–99)
Potassium: 4.8 mmol/L (ref 3.5–5.1)
Sodium: 131 mmol/L — ABNORMAL LOW (ref 135–145)

## 2021-10-30 NOTE — Progress Notes (Signed)
Daily Progress Note   Patient Name: [name]       Date: 10/30/2021 DOB: 28-Nov-1930  Age: 88 y.o. MRN#: [id number] Attending Physician: Edwin Dada, * Primary Care Physician: Baxter Hire, MD Admit Date: 10/25/2021  Reason for Consultation/Follow-up: Establishing goals of care  Subjective: Patient resting in bed with eyes closed. He awakens on my entry. Inquired how he is doing and he states "we're moving forward". Inquired about his feelings and if he had spoken with his family about his thoughts and feelings on care moving forward. He states "I'm going back to rehab lady." He closes his eyes back.   Recommends outpatient palliative.   Length of Stay: 5  Current Medications: Scheduled Meds:   (feeding supplement) PROSource Plus  30 mL Oral BID BM   aspirin EC  81 mg Oral BID   calcitRIOL  0.25 mcg Oral Daily   guaiFENesin  600 mg Oral BID   heparin injection (subcutaneous)  5,000 Units Subcutaneous Q8H   levothyroxine  75 mcg Oral Q0600   liothyronine  5 mcg Oral Daily   midodrine  10 mg Oral TID WC   multivitamin with minerals  1 tablet Oral Daily   pravastatin  20 mg Oral QPM   torsemide  100 mg Oral Daily    Continuous Infusions:   PRN Meds: acetaminophen **OR** acetaminophen, albuterol, ondansetron **OR** ondansetron (ZOFRAN) IV, polyethylene glycol, traZODone  Physical Exam Pulmonary:     Effort: Pulmonary effort is normal.  Neurological:     Mental Status: He is alert.            Vital Signs: BP 101/69 (BP Location: Right Arm)    Pulse (!) 58    Temp 98.3 F (36.8 C)    Resp 20    Ht 6' (1.829 m)    Wt 76.1 kg    SpO2 97%    BMI 22.75 kg/m  SpO2: SpO2: 97 % O2 Device: O2 Device: Nasal Cannula O2 Flow Rate: O2 Flow Rate (L/min): 3  L/min  Intake/output summary:  Intake/Output Summary (Last 24 hours) at 10/30/2021 0959 Last data filed at 10/30/2021 0500 Gross per 24 hour  Intake 560 ml  Output 1350 ml  Net -790 ml   LBM: Last BM Date: 10/28/21 Baseline Weight: Weight: 70.3 kg Most recent  weight: Weight: 76.1 kg   Patient Active Problem List   Diagnosis Date Noted   Uremia 10/29/2021   Schistocytes on peripheral blood smear 10/26/2021   Hypothyroidism 10/26/2021   Thrombocytopenia ([place]) 10/07/2021   AF (paroxysmal atrial fibrillation) ([place]) 10/06/2021   Demand ischemia ([place]) 10/06/2021   Anemia in other chronic diseases classified elsewhere 10/06/2021   Cardiogenic shock ([place]) 09/28/2021   AKI (acute kidney injury) ([place]) 05/29/2021   Pressure injury of buttock, stage 2 ([place]) 02/01/2021   Acute respiratory failure with hypoxia ([place]) 09/07/2020   Elevated troponin 09/07/2020   Pleural effusion 09/07/2020   Acute on chronic systolic CHF (congestive heart failure) ([place]) 09/07/2020   Thoracic compression fracture ([place]) 09/07/2020   Hyponatremia 08/05/2020   MGUS (monoclonal gammopathy of unknown significance) 05/23/2016   S/P total knee arthroplasty 04/02/2016   CAD in native artery 11/22/2015   TI (tricuspid incompetence) 05/11/2015   Early satiety 01/25/2015   Benign essential HTN 01/03/2015   Abdominal aortic aneurysm (AAA) without rupture 07/09/2014   MI (mitral incompetence) 07/09/2014   Aortic heart valve narrowing 07/09/2014   Cardiomyopathy, ischemic 06/28/2014   Arthritis of knee, degenerative 05/17/2014   Kidney lump 04/09/2013   Benign prostatic hyperplasia with urinary obstruction 03/17/2013   CKD (chronic kidney disease), stage V ([place]) 03/17/2013   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       Code Status:    Code Status  Orders  (From admission, onward)           Start     Ordered   10/26/21 0129  Do not attempt resuscitation (DNR)  Continuous       Question Answer Comment  In the event of cardiac or respiratory ARREST Do not call a code blue   In the event of cardiac or respiratory ARREST Do not perform Intubation, CPR, defibrillation or ACLS   In the event of cardiac or respiratory ARREST Use medication by any route, position, wound care, and other measures to relive pain and suffering. May use oxygen, suction and manual treatment of airway obstruction as needed for comfort.   Comments MOST form on chart.      10/26/21 0129           Code Status History     Date Active Date Inactive Code Status Order ID Comments User Context   10/10/2021 1309 10/16/2021 1850 DNR [id number]  Asencion Gowda, NP Inpatient   09/29/2021 1005 10/10/2021 1308 Partial Code [id number]  Flora Lipps, MD Inpatient   09/28/2021 2357 09/29/2021 1004 Full Code [id number]  Lang Snow, NP ED   05/29/2021 2243 06/01/2021 2216 Partial Code [id number]  Cox, Amy N, DO ED   05/29/2021 2202 05/29/2021 2243 Full Code [id number]  Cox, Amy N, DO ED   02/06/2021 1027 02/08/2021 2050 DNR [id number]  Sidney Ace, MD Inpatient   02/06/2021 0909 02/06/2021 1026 Full Code [id number]  Sidney Ace, MD Inpatient   02/02/2021 1003 02/06/2021 0909 DNR [id number]  Sidney Ace, MD Inpatient   01/31/2021 1342 02/02/2021 1002 Full Code [id number]  Acheampong, Warnell Bureau, MD ED   09/07/2020 0430 09/10/2020 1628 Full Code [id number]  Vianne Bulls, MD ED   08/05/2020 2155 08/10/2020 2321 Full Code [id number]  Collier Bullock, MD ED   05/11/2020 2147 05/17/2020 2241 Full Code [id number]  Clarnce Flock,  MD Inpatient   04/02/2016 1640 04/05/2016 1816 Full Code [id number]  Dereck Leep, MD Inpatient   05/19/2015 1121 05/20/2015 0326 Full Code [id number]  Sabino Dick, MD HOV      Advance Directive Documentation    Flowsheet Row Most Recent  Value  Type of Advance Directive Healthcare Power of Attorney  Pre-existing out of facility DNR order (yellow form or pink MOST form) --  "MOST" Form in Place? --       Prognosis:  < 6 months  Care plan was discussed with TOC  Thank you for allowing the Palliative Medicine Team to assist in the care of this patient.       Total Time 15 min Prolonged Time Billed  no       Greater than 50%  of this time was spent counseling and coordinating care related to the above assessment and plan.  Asencion Gowda, NP  Please contact Palliative Medicine Team phone at [phone] for questions and concerns.

## 2021-10-30 NOTE — NC FL2 (Signed)
[place] LEVEL OF CARE SCREENING TOOL     IDENTIFICATION  Patient Name: [name] Birthdate: Aug 31, 1931 Sex: male Admission Date (Current Location): 10/25/2021  [facility] and [place] Number:  Engineering geologist and Address:  [facility], [address], [place], [place] [address]      Provider Number: [id number]  Attending Physician Name and Address:  Edwin Dada, *  Relative Name and Phone Number:  [name] (son) [phone]    Current Level of Care: Hospital Recommended Level of Care: [place] Prior Approval Number:    Date Approved/Denied:   PASRR Number: [id number]  Discharge Plan: SNF    Current Diagnoses: Patient Active Problem List   Diagnosis Date Noted   Uremia 10/29/2021   Schistocytes on peripheral blood smear 10/26/2021   Hypothyroidism 10/26/2021   Thrombocytopenia ([place]) 10/07/2021   AF (paroxysmal atrial fibrillation) ([place]) 10/06/2021   Demand ischemia ([place]) 10/06/2021   Anemia in other chronic diseases classified elsewhere 10/06/2021   Cardiogenic shock ([place]) 09/28/2021   AKI (acute kidney injury) ([place]) 05/29/2021   Pressure injury of buttock, stage 2 ([place]) 02/01/2021   Acute respiratory failure with hypoxia ([place]) 09/07/2020   Elevated troponin 09/07/2020   Pleural effusion 09/07/2020   Acute on chronic systolic CHF (congestive heart failure) ([place]) 09/07/2020   Thoracic compression fracture ([place]) 09/07/2020   Hyponatremia 08/05/2020   MGUS (monoclonal gammopathy of unknown significance) 05/23/2016   S/P total knee arthroplasty 04/02/2016   CAD in native artery 11/22/2015   TI (tricuspid incompetence) 05/11/2015   Early satiety 01/25/2015   Benign essential HTN 01/03/2015   Abdominal aortic aneurysm (AAA) without rupture 07/09/2014   MI (mitral incompetence) 07/09/2014   Aortic heart valve narrowing 07/09/2014   Cardiomyopathy, ischemic 06/28/2014   Arthritis of knee,  degenerative 05/17/2014   Kidney lump 04/09/2013   Benign prostatic hyperplasia with urinary obstruction 03/17/2013   CKD (chronic kidney disease), stage V ([place]) 03/17/2013   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    Orientation RESPIRATION BLADDER Height & Weight     Self, Time, Situation, Place  O2 (3L O2 nassal cannula) Incontinent, External catheter Weight: 167 lb 12.3 oz (76.1 kg) Height:  6' (182.9 cm)  BEHAVIORAL SYMPTOMS/MOOD NEUROLOGICAL BOWEL NUTRITION STATUS      Incontinent Diet (see discharge summary)  AMBULATORY STATUS COMMUNICATION OF NEEDS Skin   Limited Assist Verbally Other (Comment) (pressure injury stage 2 buttocks)                       Personal Care Assistance Level of Assistance  Bathing, Feeding, Dressing, Total care Bathing Assistance: Limited assistance Feeding assistance: Independent Dressing Assistance: Limited assistance Total Care Assistance: Limited assistance   Functional Limitations Info  Sight, Hearing, Speech Sight Info: Adequate Hearing Info: Adequate Speech Info: Adequate    SPECIAL CARE FACTORS FREQUENCY  PT (By licensed PT), OT (By licensed OT)     PT Frequency: min 5x weekly OT Frequency: min 5x weekly            Contractures Contractures Info: Not present    Additional Factors Info  Code Status, Allergies Code Status Info: DNR Allergies Info: Morphine   Oxycodone   Carvedilol   Cefuroxime Axetil   Iodinated Contrast Media   Lisinopril           Current Medications (10/30/2021):  This is the current hospital active medication list Current Facility-Administered Medications  Medication Dose Route  Frequency Provider Last Rate Last Admin   (feeding supplement) PROSource Plus liquid 30 mL  30 mL Oral BID BM Mansy, Jan A, MD   30 mL at 10/30/21 2035   acetaminophen (TYLENOL) tablet 650 mg  650 mg Oral Q6H PRN Mansy, Jan A, MD       Or   acetaminophen (TYLENOL) suppository 650 mg  650 mg Rectal Q6H PRN  Mansy, Jan A, MD       albuterol (PROVENTIL) (2.5 MG/3ML) 0.083% nebulizer solution 2.5 mg  2.5 mg Nebulization Q4H PRN Edwin Dada, MD       aspirin EC tablet 81 mg  81 mg Oral BID Mansy, Jan A, MD   81 mg at 10/30/21 5974   calcitRIOL (ROCALTROL) capsule 0.25 mcg  0.25 mcg Oral Daily Mansy, Jan A, MD   0.25 mcg at 10/30/21 0920   guaiFENesin (MUCINEX) 12 hr tablet 600 mg  600 mg Oral BID Mansy, Jan A, MD   600 mg at 10/30/21 0921   heparin injection 5,000 Units  5,000 Units Subcutaneous Q8H Dorothe Pea, RPH   5,000 Units at 10/29/21 2236   levothyroxine (SYNTHROID) tablet 75 mcg  75 mcg Oral Q0600 Mansy, Jan A, MD   75 mcg at 10/29/21 0554   liothyronine (CYTOMEL) tablet 5 mcg  5 mcg Oral Daily Mansy, Jan A, MD   5 mcg at 10/30/21 0920   midodrine (PROAMATINE) tablet 10 mg  10 mg Oral TID WC Mansy, Jan A, MD   10 mg at 10/30/21 1638   multivitamin with minerals tablet 1 tablet  1 tablet Oral Daily Mansy, Jan A, MD   1 tablet at 10/30/21 0921   ondansetron (ZOFRAN) tablet 4 mg  4 mg Oral Q6H PRN Mansy, Jan A, MD       Or   ondansetron [facility]) injection 4 mg  4 mg Intravenous Q6H PRN Mansy, Jan A, MD       polyethylene glycol (MIRALAX / GLYCOLAX) packet 17 g  17 g Oral Daily PRN Dorothe Pea, RPH       pravastatin (PRAVACHOL) tablet 20 mg  20 mg Oral QPM Mansy, Jan A, MD   20 mg at 10/29/21 1725   torsemide (DEMADEX) tablet 100 mg  100 mg Oral Daily Kolluru, Sarath, MD   100 mg at 10/30/21 0920   traZODone (DESYREL) tablet 25 mg  25 mg Oral QHS PRN Mansy, Arvella Merles, MD         Discharge Medications: Please see discharge summary for a list of discharge medications.  Relevant Imaging Results:  Relevant Lab Results:   Additional Information [phone]  Alberteen Sam, LCSW

## 2021-10-30 NOTE — Progress Notes (Signed)
[facility]  ROUNDING NOTE   Subjective:   [name] is a 88 year old with past medical history of hypertension, dyslipidemia, aortic aneurysm, and stage IV CKD.  Patient presents from rehab facility with fluid overloa.  With suspected to be dehydrated and prescribed IV fluids.  Patient states no shortness of breath but bilateral lower extremity and abdominal edema.  Patient has been admitted for Acute pulmonary edema (HCC) [J81.0] Acute CHF (congestive heart failure) ([name]) [I50.9] Urinary tract infection without hematuria, site unspecified [N39.0]  Patient is known to our practice and receives outpatient care from Dr. Holley Raring.    Patient seen sitting up in bed, eating breakfast Alert and oriented Denies shortness of breath  Creatinine 4.1 Urine output 2L recorded in past 24 hours   Objective:  Vital signs in last 24 hours:  Temp:  [97.7 F (36.5 C)-98.3 F (36.8 C)] 97.7 F (36.5 C) (02/13 1159) Pulse Rate:  [56-67] 56 (02/13 1159) Resp:  [17-20] 17 (02/13 1159) BP: (101-112)/(62-70) 106/64 (02/13 1159) SpO2:  [95 %-100 %] 95 % (02/13 1159) Weight:  [76.1 kg] 76.1 kg (02/13 0500)  Weight change: 0.3 kg Filed Weights   10/28/21 0436 10/29/21 0500 10/30/21 0500  Weight: 71.9 kg 75.8 kg 76.1 kg    Intake/Output: I/O last 3 completed shifts: In: 1102.3 [P.O.:1040; IV Piggyback:62.3] Out: 2400 [Urine:2400]   Intake/Output this shift:  Total I/O In: -  Out: 750 [Urine:750]  Physical Exam: General: NAD  Head: Normocephalic, atraumatic. Moist oral mucosal membranes  Eyes: Anicteric  Lungs:  Crackles, normal effort, O2   Heart: Regular rate and rhythm  Abdomen:  Soft, nontender  Extremities:  trace peripheral edema.  Neurologic: Nonfocal, moving all four extremities  Skin: No lesions       Basic Metabolic Panel: Recent Labs  Lab 10/26/21 0625 10/27/21 0430 10/28/21 0500 10/29/21 0435 10/30/21 0509  NA 136 132* 134* 129* 131*  K 4.6 4.7  4.9 4.7 4.8  CL 100 99 99 96* 95*  CO2 21* 21* 20* 20* 21*  GLUCOSE 95 92 95 103* 99  BUN 157* 156* 147* 143* 99*  CREATININE 4.34* 4.30* 4.41* 4.17* 4.09*  CALCIUM 8.4* 8.5* 8.7* 8.5* 8.8*     Liver Function Tests: Recent Labs  Lab 10/25/21 2136 10/28/21 0500  AST 42* 27  ALT 61* 42  ALKPHOS 119 118  BILITOT 0.9 0.9  PROT 7.0 6.7  ALBUMIN 3.4* 3.1*    No results for input(s): LIPASE, AMYLASE in the last 168 hours. No results for input(s): AMMONIA in the last 168 hours.  CBC: Recent Labs  Lab 10/25/21 2136 10/26/21 0625 10/28/21 0500  WBC 5.3 4.9 5.2  NEUTROABS 3.5  --   --   HGB 8.7* 8.5* 8.7*  HCT 27.4* 26.1* 27.1*  MCV 111.4* 108.3* 108.4*  PLT 171 157 165     Cardiac Enzymes: No results for input(s): CKTOTAL, CKMB, CKMBINDEX, TROPONINI in the last 168 hours.  BNP: Invalid input(s): POCBNP  CBG: No results for input(s): GLUCAP in the last 168 hours.  Microbiology: Results for orders placed or performed during the hospital encounter of 10/25/21  Blood culture (routine x 2)     Status: None (Preliminary result)   Collection Time: 10/25/21 11:41 PM   Specimen: BLOOD  Result Value Ref Range Status   Specimen Description BLOOD LEFT ASSIST CONTROL  Final   Special Requests   Final    BOTTLES DRAWN AEROBIC AND ANAEROBIC Blood Culture adequate volume  Culture   Final    NO GROWTH 4 DAYS Performed at [facility], [address] [place], [place] [address]    Report Status PENDING  Incomplete  Resp Panel by RT-PCR (Flu A&B, Covid) Nasopharyngeal Swab     Status: None   Collection Time: 10/25/21 11:41 PM   Specimen: Nasopharyngeal Swab; Nasopharyngeal(NP) swabs in vial transport medium  Result Value Ref Range Status   SARS Coronavirus 2 by RT PCR NEGATIVE NEGATIVE Final    Comment: (NOTE) SARS-CoV-2 target nucleic acids are NOT DETECTED.  The SARS-CoV-2 RNA is generally detectable in upper respiratory specimens during the acute phase of  infection. The lowest concentration of SARS-CoV-2 viral copies this assay can detect is 138 copies/mL. A negative result does not preclude SARS-Cov-2 infection and should not be used as the sole basis for treatment or other patient management decisions. A negative result may occur with  improper specimen collection/handling, submission of specimen other than nasopharyngeal swab, presence of viral mutation(s) within the areas targeted by this assay, and inadequate number of viral copies(<138 copies/mL). A negative result must be combined with clinical observations, patient history, and epidemiological information. The expected result is Negative.  Fact Sheet for Patients:  EntrepreneurPulse.com.au  Fact Sheet for Healthcare Providers:  IncredibleEmployment.be  This test is no t yet approved or cleared by the [place] FDA and  has been authorized for detection and/or diagnosis of SARS-CoV-2 by FDA under an Emergency Use Authorization (EUA). This EUA will remain  in effect (meaning this test can be used) for the duration of the COVID-19 declaration under Section 564(b)(1) of the Act, 21 U.S.C.section 360bbb-3(b)(1), unless the authorization is terminated  or revoked sooner.       Influenza A by PCR NEGATIVE NEGATIVE Final   Influenza B by PCR NEGATIVE NEGATIVE Final    Comment: (NOTE) The Xpert Xpress SARS-CoV-2/FLU/RSV plus assay is intended as an aid in the diagnosis of influenza from Nasopharyngeal swab specimens and should not be used as a sole basis for treatment. Nasal washings and aspirates are unacceptable for Xpert Xpress SARS-CoV-2/FLU/RSV testing.  Fact Sheet for Patients: EntrepreneurPulse.com.au  Fact Sheet for Healthcare Providers: IncredibleEmployment.be  This test is not yet approved or cleared by the [place] FDA and has been authorized for detection and/or diagnosis of SARS-CoV-2  by FDA under an Emergency Use Authorization (EUA). This EUA will remain in effect (meaning this test can be used) for the duration of the COVID-19 declaration under Section 564(b)(1) of the Act, 21 U.S.C. section 360bbb-3(b)(1), unless the authorization is terminated or revoked.  Performed at [facility], [address] [place], [place] [address]   Blood culture (routine x 2)     Status: None (Preliminary result)   Collection Time: 10/25/21 11:43 PM   Specimen: BLOOD  Result Value Ref Range Status   Specimen Description BLOOD LEFT ASSIST CONTROL  Final   Special Requests   Final    BOTTLES DRAWN AEROBIC AND ANAEROBIC Blood Culture adequate volume   Culture   Final    NO GROWTH 4 DAYS Performed at [facility], [address] [place], [place] [address]    Report Status PENDING  Incomplete    Coagulation Studies: No results for input(s): LABPROT, INR in the last 72 hours.  Urinalysis: No results for input(s): COLORURINE, LABSPEC, PHURINE, GLUCOSEU, HGBUR, BILIRUBINUR, KETONESUR, PROTEINUR, UROBILINOGEN, NITRITE, LEUKOCYTESUR in the last 72 hours.  Invalid input(s): APPERANCEUR     Imaging: No results found.  Medications:      (feeding supplement) PROSource Plus  30 mL Oral BID BM   aspirin EC  81 mg Oral BID   calcitRIOL  0.25 mcg Oral Daily   guaiFENesin  600 mg Oral BID   heparin injection (subcutaneous)  5,000 Units Subcutaneous Q8H   levothyroxine  75 mcg Oral Q0600   liothyronine  5 mcg Oral Daily   midodrine  10 mg Oral TID WC   multivitamin with minerals  1 tablet Oral Daily   pravastatin  20 mg Oral QPM   torsemide  100 mg Oral Daily   acetaminophen **OR** acetaminophen, albuterol, ondansetron **OR** ondansetron (ZOFRAN) IV, polyethylene glycol, traZODone  Assessment/ Plan:  [name] is a 88 y.o.  male with past medical history of hypertension, dyslipidemia, aortic aneurysm, and stage IV CKD.  Patient presents from  rehab facility with fluid overload.  With suspected to be dehydrated and prescribed IV fluids.  Patient states no shortness of breath but bilateral lower extremity and abdominal edema.  Patient has been admitted for Acute pulmonary edema (HCC) [J81.0] Acute CHF (congestive heart failure) (HCC) [I50.9] Urinary tract infection without hematuria, site unspecified [N39.0]   Acute Kidney Injury with hyponatremia on chronic kidney disease stage IV with baseline creatinine 3.54 and GFR of 16 on 10/14/21.  Acute kidney injury secondary to cardiorenal syndrome Chronic kidney disease secondary to hypertension and vascular disease. No IV contrast exposure. No acute indication for dialysis at this time -No longer interested in dialysis - Transitioned to oral torsemide 100mg  daily  - Sodium improving with fluid restriction.  - Suggest maintianing current diuretic dose at discharge, then decreasing to torsemide 40mg  daily after 3-4 days - Will schedule follow up with our office at discharge  Lab Results  Component Value Date   CREATININE 4.09 (H) 10/30/2021   CREATININE 4.17 (H) 10/29/2021   CREATININE 4.41 (H) 10/28/2021    Intake/Output Summary (Last 24 hours) at 10/30/2021 1517 Last data filed at 10/30/2021 1159 Gross per 24 hour  Intake 0 ml  Output 1500 ml  Net -1500 ml    2.  Chronic systolic heart failure.  Echo from 10/03/2021 shows EF less than 20% with left ventricular hypokinesis.       LOS: 5 [name] 2/13/20233:17 PM

## 2021-10-30 NOTE — TOC Initial Note (Signed)
Transition of Care [facility]) - Initial/Assessment Note    Patient Details  Name: [name] MRN: [id number] Date of Birth: 1931/01/18  Transition of Care [facility]) CM/SW Contact:    Alberteen Sam, LCSW Phone Number: 10/30/2021, 10:11 AM  Clinical Narrative:                  CSW notes patient not agreeable to hospice at this time per palliative conversation, patient agreeable to return to [facility] and be followed by outpatient palliative.   CSW has started patient's HTA auth to return to [facility], pending insurance auth at this time.   Expected Discharge Plan: Skilled Nursing Facility Barriers to Discharge: Continued Medical Work up   Patient Goals and CMS Choice Patient states their goals for this hospitalization and ongoing recovery are:: to go home CMS Medicare.gov Compare Post Acute Care list provided to:: Patient Choice offered to / list presented to : Patient  Expected Discharge Plan and Services Expected Discharge Plan: [place]       Living arrangements for the past 2 months: [place]                                      Prior Living Arrangements/Services Living arrangements for the past 2 months: [place] Lives with:: Self, Facility Resident   Do you feel safe going back to the place where you live?: Yes               Activities of Daily Living Home Assistive Devices/Equipment: Eyeglasses ADL Screening (condition at time of admission) Patient's cognitive ability adequate to safely complete daily activities?: Yes Is the patient deaf or have difficulty hearing?: Yes Does the patient have difficulty seeing, even when wearing glasses/contacts?: Yes Does the patient have difficulty concentrating, remembering, or making decisions?: Yes Patient able to express need for assistance with ADLs?: Yes Does the patient have difficulty dressing or bathing?: No Independently performs ADLs?: Yes  (appropriate for developmental age) Communication: Needs assistance Dressing (OT): Needs assistance Grooming: Needs assistance Feeding: Needs assistance Bathing: Needs assistance Toileting: Needs assistance In/Out Bed: Needs assistance Walks in Home: Needs assistance Does the patient have difficulty walking or climbing stairs?: Yes Weakness of Legs: Both Weakness of Arms/Hands: None  Permission Sought/Granted                  Emotional Assessment         Alcohol / Substance Use: Not Applicable Psych Involvement: No (comment)  Admission diagnosis:  Acute pulmonary edema (HCC) [J81.0] Acute CHF (congestive heart failure) ([place]) [I50.9] Urinary tract infection without hematuria, site unspecified [N39.0] Patient Active Problem List   Diagnosis Date Noted   Uremia 10/29/2021   Schistocytes on peripheral blood smear 10/26/2021   Hypothyroidism 10/26/2021   Thrombocytopenia ([place]) 10/07/2021   AF (paroxysmal atrial fibrillation) ([place]) 10/06/2021   Demand ischemia ([place]) 10/06/2021   Anemia in other chronic diseases classified elsewhere 10/06/2021   Cardiogenic shock ([place]) 09/28/2021   AKI (acute kidney injury) ([place]) 05/29/2021   Pressure injury of buttock, stage 2 ([place]) 02/01/2021   Acute respiratory failure with hypoxia ([place]) 09/07/2020   Elevated troponin 09/07/2020   Pleural effusion 09/07/2020   Acute on chronic systolic CHF (congestive heart failure) ([place]) 09/07/2020   Thoracic compression fracture ([place]) 09/07/2020   Hyponatremia 08/05/2020   MGUS (monoclonal gammopathy of unknown significance) 05/23/2016   S/P  total knee arthroplasty 04/02/2016   CAD in native artery 11/22/2015   TI (tricuspid incompetence) 05/11/2015   Early satiety 01/25/2015   Benign essential HTN 01/03/2015   Abdominal aortic aneurysm (AAA) without rupture 07/09/2014   MI (mitral incompetence) 07/09/2014   Aortic heart valve narrowing 07/09/2014   Cardiomyopathy, ischemic 06/28/2014    Arthritis of knee, degenerative 05/17/2014   Kidney lump 04/09/2013   Benign prostatic hyperplasia with urinary obstruction 03/17/2013   CKD (chronic kidney disease), stage V ([place]) 03/17/2013   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

## 2021-10-30 NOTE — Progress Notes (Signed)
PT Cancellation Note  Patient Details Name: [name] MRN: [id number] DOB: July 31, 1931   Cancelled Treatment:    Reason Eval/Treat Not Completed: Other (comment)  Pt offered but declined session this pm  Agreed to tomorrow.   Chesley Noon 10/30/2021, 4:08 PM

## 2021-10-30 NOTE — Progress Notes (Signed)
°  Progress Note   Patient: [name] SLH:[id number] DOB: 12-17-1930 DOA: 10/25/2021     5 DOS: the patient was seen and examined on 10/30/2021       Brief hospital course: [name] is an 75 yr. M with CKD V, baseline 3.9, sCHF EF <20% due to ischemic CM, AS and CAD s/p CABG and AVR 2011, pAF not on AC, PVD and recent life-threatening myxedema coma who presented from SNF with abnormal labs.  Patient has been at [facility] for the last 10 days prior to admission, felt like he was "getting better".  Able to get out of bed, more alert.  Last few days he was told that he had "dehydration", so was given IV fluids at the facility.  Then sent to the ER for "dehydration".  In the ER, creatinine up to 4.4, up from 3.5 at discharge.  He had peripheral edema, BNP greater than 4500, and chest x-ray with worsened bilateral airspace opacities.  Started on Lasix and admitted.       Assessment and Plan: * Acute on chronic systolic CHF (congestive heart failure) (HCC)- (present on admission) Net -1.2L yesterday Cr improved, BUN down to 99   - Continue torsemide 100 bid, plan to continue for 4 days then decrease to 40 BID - Consult Nephrology, appreciate expertise      CKD (chronic kidney disease), stage V ([place])- (present on admission) Cr better  Uremia  Hypothyroidism -Continue levothyroxine, liothyronine - Outpatient endocrinology follow up   Anemia in other chronic diseases classified elsewhere- (present on admission) Hgb stable, no clinical bleeding  AF (paroxysmal atrial fibrillation) ([place])- (present on admission) Not on anticoagulation due to pre-existing factors. -Continue low dose aspirin    Benign essential HTN- (present on admission) BP low normal - Hold losartan - Continue midodrine - Continue furosemide             Subjective: Sleepy this morning, but no new dyspnea, swelling, chest pain, confusion.     Physical Exam: Vitals:   10/30/21 0030 10/30/21  0500 10/30/21 0759 10/30/21 1159  BP: 112/68  101/69 106/64  Pulse: 67  (!) 58 (!) 56  Resp: 17  20 17   Temp: 98.2 F (36.8 C)  98.3 F (36.8 C) 97.7 F (36.5 C)  TempSrc: Oral     SpO2:   97% 95%  Weight:  76.1 kg    Height:       Elderly adult male, lying in bed, about to eat breakfast, no obvious distress Crackles in anterior lung fields, lung sounds clear in the posterior fields, good air movement, respiratory effort normal JVP elevated while sitting upright, no lower extremity edema, no systolic murmur Abdominal exam soft, no tenderness Mentation slowed, sleepy, moderate psychomotor slowing.         Data Reviewed: This with nephrology My review of labs and imaging is notable for creatinine slightly improved from yesterday, BUN down to 99, sodium slightly up Hemoglobin no change from previous Discussed with nephrology     Family Communication:    Disposition: Status is: Inpatient Remains inpatient appropriate because: The patient is medically cleared for discharge to skilled nursing facility as soon as a bed is available          Planned Discharge Destination: Skilled nursing facility       Author: Edwin Dada, MD 10/30/2021 3:12 PM  For on call review [email or web address].

## 2021-10-31 DIAGNOSIS — D638 Anemia in other chronic diseases classified elsewhere: Secondary | ICD-10-CM | POA: Diagnosis not present

## 2021-10-31 DIAGNOSIS — I10 Essential (primary) hypertension: Secondary | ICD-10-CM | POA: Diagnosis not present

## 2021-10-31 DIAGNOSIS — I5023 Acute on chronic systolic (congestive) heart failure: Secondary | ICD-10-CM | POA: Diagnosis not present

## 2021-10-31 DIAGNOSIS — I480 Paroxysmal atrial fibrillation: Secondary | ICD-10-CM | POA: Diagnosis not present

## 2021-10-31 DIAGNOSIS — Z7189 Other specified counseling: Secondary | ICD-10-CM

## 2021-10-31 LAB — CULTURE, BLOOD (ROUTINE X 2)
Culture: NO GROWTH
Culture: NO GROWTH
Special Requests: ADEQUATE
Special Requests: ADEQUATE

## 2021-10-31 LAB — BASIC METABOLIC PANEL
Anion gap: 10 (ref 5–15)
BUN: 148 mg/dL — ABNORMAL HIGH (ref 8–23)
CO2: 24 mmol/L (ref 22–32)
Calcium: 8.8 mg/dL — ABNORMAL LOW (ref 8.9–10.3)
Chloride: 97 mmol/L — ABNORMAL LOW (ref 98–111)
Creatinine, Ser: 4.11 mg/dL — ABNORMAL HIGH (ref 0.61–1.24)
GFR, Estimated: 13 mL/min — ABNORMAL LOW (ref >60)
Glucose, Bld: 94 mg/dL (ref 70–99)
Potassium: 4.9 mmol/L (ref 3.5–5.1)
Sodium: 131 mmol/L — ABNORMAL LOW (ref 135–145)

## 2021-10-31 LAB — RESP PANEL BY RT-PCR (FLU A&B, COVID) ARPGX2
Influenza A by PCR: NEGATIVE
Influenza B by PCR: NEGATIVE
SARS Coronavirus 2 by RT PCR: NEGATIVE

## 2021-10-31 MED ORDER — TORSEMIDE 20 MG PO TABS
40.0000 mg | ORAL_TABLET | Freq: Every day | ORAL | Status: DC
  Administered 2021-11-01 – 2021-11-08 (×6): 40 mg via ORAL
  Filled 2021-10-31 (×6): qty 2

## 2021-10-31 MED ORDER — METOLAZONE 2.5 MG PO TABS
2.5000 mg | ORAL_TABLET | ORAL | Status: DC
  Administered 2021-11-03 – 2021-11-06 (×2): 2.5 mg via ORAL
  Filled 2021-10-31 (×3): qty 1

## 2021-10-31 MED ORDER — HYDROCERIN EX CREA
TOPICAL_CREAM | Freq: Two times a day (BID) | CUTANEOUS | Status: DC | PRN
  Filled 2021-10-31 (×2): qty 113

## 2021-10-31 NOTE — TOC Progression Note (Addendum)
Transition of Care [facility]) - Progression Note    Patient Details  Name: [name] MRN: [id number] Date of Birth: [birth year]  Transition of Care [facility]) CM/SW Contact  Eileen Stanford, LCSW Phone Number: 10/31/2021, 2:22 PM  Clinical Narrative:   CSW spoke with pt's son who was interested in pt going to hospice or hospice at home. Pt's son agreeable to hospice talking to pt to explain hospice at home services.  Pt's daughter then called CSW stating that hospice is out of the picture and that she was told by [employer] that pt could return at dc it should not be a problem. CSW explained the SNF is not driving the denial it was the insurance company. Pt's daughter then states "Im not going to allowing insurance to be my daddy's doctor." Pt's daughter then goes on to state that every one of pt's doctors says pt should go to SNF. CSW reiterated this is insurance driven therefore we can not change their decision. CSW did explained that MD graciously did the peer to peer however it was still then denied. CSW explained that it was told to CSW that "pt is custodial" and not rehab appropriate. Pt's daughter did not agree and states "I will call the hospital administrator as soon as I hang up with you." However at the end of the conversation pt's daughter thanked CSW for assistance. MD notified. CSW unsure of dc plan at this time. Insurance is denying auth for SNF therefore it would be private pay or dc home with services. Pt's daughter is not interested in any hospice services. MD notified. TOC Supervisor also notified of case.  4:00- CSW received the insurance denial letter from HTA. CSW called pt's daughter to explain her right to appeal. Pt's daughter states "we don't have time for that." Pt's daughter states she has already put out multiple calls to pt's heart doctor and per pt's daughter he is telling her pt can participate in therapy. Again, CSW has explained this is insurance driven. Pt's daughter  states she has also called hospital administrator and has left a voicemail requesting a call back. At this time HTA has denied SNF auth and a safe dc plan has been offered (home with hospice service).   Expected Discharge Plan: [place] Barriers to Discharge: Continued Medical Work up  Expected Discharge Plan and Services Expected Discharge Plan: [place] arrangements for the past 2 months: [place]                                       Social Determinants of Health (SDOH) Interventions    Readmission Risk Interventions Readmission Risk Prevention Plan 05/31/2021  Transportation Screening Complete  PCP or Specialist Appt within 3-5 Days Complete  Social Work Consult for [place] Planning/Counseling Complete  Palliative Care Screening Not Applicable  Medication Review Press photographer) Complete  Some recent data might be hidden

## 2021-10-31 NOTE — Assessment & Plan Note (Addendum)
As outlined above, the patient had advanced renal and cardiac failure prior to his myxedema event, now compounded by that.  Seemed to be improving from thyroid failure, actually, and was in rehab with Cr down to 3.5, but then Cr started trending up.  Here, dialysis was discussed again, patient made clear he did not wish to proceed with this.  In light of that, Nephrology recommended medical management.  They advised patient, and I agree, that in that situation he likely has less than 6 months life expectancy (this is also true if he were to do dialysis, in fact).  Any reduction in his diuretics will quickly result in congestion.  Likely uremia will be the limiting factor in his achieving a reasonable quality of life.  Insurance have declined to pay for acute rehab services.  Family are in the process of deciding whether to self-pay or return home.  We will support family in whatever they choose. Hospice referral has been offered if family elect to use this resource although this is clearly at their preference.

## 2021-10-31 NOTE — Progress Notes (Signed)
[facility]  ROUNDING NOTE   Subjective:   [name] is a 88 year old with past medical history of hypertension, dyslipidemia, aortic aneurysm, and stage IV CKD.  Patient presents from rehab facility with fluid overloa.  With suspected to be dehydrated and prescribed IV fluids.  Patient states no shortness of breath but bilateral lower extremity and abdominal edema.  Patient has been admitted for Acute pulmonary edema (HCC) [J81.0] Acute CHF (congestive heart failure) ([place]) [I50.9] Urinary tract infection without hematuria, site unspecified [N39.0]  Patient is known to our practice and receives outpatient care from Dr. Holley Raring.    Patient resting quietly, appears in somber mood Currently eating breakfast Denies nausea and vomiting Denies shortness of breath  States he is unable to go to rehab, unsure of discharge plan  Urine output 1.95L in proceeding 24 hours Creatinine stable  Objective:  Vital signs in last 24 hours:  Temp:  [97.4 F (36.3 C)-98 F (36.7 C)] 97.4 F (36.3 C) (02/14 0819) Pulse Rate:  [58-62] 59 (02/14 0819) Resp:  [16-18] 16 (02/14 0819) BP: (98-111)/(65-73) 105/73 (02/14 0819) SpO2:  [97 %-99 %] 99 % (02/14 0819)  Weight change:  Filed Weights   10/28/21 0436 10/29/21 0500 10/30/21 0500  Weight: 71.9 kg 75.8 kg 76.1 kg    Intake/Output: I/O last 3 completed shifts: In: 120 [P.O.:120] Out: 2700 [Urine:2700]   Intake/Output this shift:  Total I/O In: 360 [P.O.:360] Out: -   Physical Exam: General: NAD, withdrawn  Head: Normocephalic, atraumatic. Moist oral mucosal membranes  Eyes: Anicteric  Lungs:  Crackles, normal effort, O2   Heart: Regular rate and rhythm  Abdomen:  Soft, nontender  Extremities:  trace peripheral edema.  Neurologic: Nonfocal, moving all four extremities  Skin: No lesions       Basic Metabolic Panel: Recent Labs  Lab 10/27/21 0430 10/28/21 0500 10/29/21 0435 10/30/21 0509 10/31/21 0416  NA  132* 134* 129* 131* 131*  K 4.7 4.9 4.7 4.8 4.9  CL 99 99 96* 95* 97*  CO2 21* 20* 20* 21* 24  GLUCOSE 92 95 103* 99 94  BUN 156* 147* 143* 99* 148*  CREATININE 4.30* 4.41* 4.17* 4.09* 4.11*  CALCIUM 8.5* 8.7* 8.5* 8.8* 8.8*     Liver Function Tests: Recent Labs  Lab 10/25/21 2136 10/28/21 0500  AST 42* 27  ALT 61* 42  ALKPHOS 119 118  BILITOT 0.9 0.9  PROT 7.0 6.7  ALBUMIN 3.4* 3.1*    No results for input(s): LIPASE, AMYLASE in the last 168 hours. No results for input(s): AMMONIA in the last 168 hours.  CBC: Recent Labs  Lab 10/25/21 2136 10/26/21 0625 10/28/21 0500  WBC 5.3 4.9 5.2  NEUTROABS 3.5  --   --   HGB 8.7* 8.5* 8.7*  HCT 27.4* 26.1* 27.1*  MCV 111.4* 108.3* 108.4*  PLT 171 157 165     Cardiac Enzymes: No results for input(s): CKTOTAL, CKMB, CKMBINDEX, TROPONINI in the last 168 hours.  BNP: Invalid input(s): POCBNP  CBG: No results for input(s): GLUCAP in the last 168 hours.  Microbiology: Results for orders placed or performed during the hospital encounter of 10/25/21  Blood culture (routine x 2)     Status: None   Collection Time: 10/25/21 11:41 PM   Specimen: BLOOD  Result Value Ref Range Status   Specimen Description BLOOD LEFT ASSIST CONTROL  Final   Special Requests   Final    BOTTLES DRAWN AEROBIC AND ANAEROBIC Blood Culture  adequate volume   Culture   Final    NO GROWTH 5 DAYS Performed at [facility], [address] [place], [place] [address]    Report Status 10/31/2021 FINAL  Final  Resp Panel by RT-PCR (Flu A&B, Covid) Nasopharyngeal Swab     Status: None   Collection Time: 10/25/21 11:41 PM   Specimen: Nasopharyngeal Swab; Nasopharyngeal(NP) swabs in vial transport medium  Result Value Ref Range Status   SARS Coronavirus 2 by RT PCR NEGATIVE NEGATIVE Final    Comment: (NOTE) SARS-CoV-2 target nucleic acids are NOT DETECTED.  The SARS-CoV-2 RNA is generally detectable in upper respiratory specimens during the  acute phase of infection. The lowest concentration of SARS-CoV-2 viral copies this assay can detect is 138 copies/mL. A negative result does not preclude SARS-Cov-2 infection and should not be used as the sole basis for treatment or other patient management decisions. A negative result may occur with  improper specimen collection/handling, submission of specimen other than nasopharyngeal swab, presence of viral mutation(s) within the areas targeted by this assay, and inadequate number of viral copies(<138 copies/mL). A negative result must be combined with clinical observations, patient history, and epidemiological information. The expected result is Negative.  Fact Sheet for Patients:  EntrepreneurPulse.com.au  Fact Sheet for Healthcare Providers:  IncredibleEmployment.be  This test is no t yet approved or cleared by the [place] FDA and  has been authorized for detection and/or diagnosis of SARS-CoV-2 by FDA under an Emergency Use Authorization (EUA). This EUA will remain  in effect (meaning this test can be used) for the duration of the COVID-19 declaration under Section 564(b)(1) of the Act, 21 U.S.C.section 360bbb-3(b)(1), unless the authorization is terminated  or revoked sooner.       Influenza A by PCR NEGATIVE NEGATIVE Final   Influenza B by PCR NEGATIVE NEGATIVE Final    Comment: (NOTE) The Xpert Xpress SARS-CoV-2/FLU/RSV plus assay is intended as an aid in the diagnosis of influenza from Nasopharyngeal swab specimens and should not be used as a sole basis for treatment. Nasal washings and aspirates are unacceptable for Xpert Xpress SARS-CoV-2/FLU/RSV testing.  Fact Sheet for Patients: EntrepreneurPulse.com.au  Fact Sheet for Healthcare Providers: IncredibleEmployment.be  This test is not yet approved or cleared by the [place] FDA and has been authorized for detection and/or  diagnosis of SARS-CoV-2 by FDA under an Emergency Use Authorization (EUA). This EUA will remain in effect (meaning this test can be used) for the duration of the COVID-19 declaration under Section 564(b)(1) of the Act, 21 U.S.C. section 360bbb-3(b)(1), unless the authorization is terminated or revoked.  Performed at [facility], [address] [place], [place] [address]   Blood culture (routine x 2)     Status: None   Collection Time: 10/25/21 11:43 PM   Specimen: BLOOD  Result Value Ref Range Status   Specimen Description BLOOD LEFT ASSIST CONTROL  Final   Special Requests   Final    BOTTLES DRAWN AEROBIC AND ANAEROBIC Blood Culture adequate volume   Culture   Final    NO GROWTH 5 DAYS Performed at [facility], [address] [place], [place] [address]    Report Status 10/31/2021 FINAL  Final    Coagulation Studies: No results for input(s): LABPROT, INR in the last 72 hours.  Urinalysis: No results for input(s): COLORURINE, LABSPEC, PHURINE, GLUCOSEU, HGBUR, BILIRUBINUR, KETONESUR, PROTEINUR, UROBILINOGEN, NITRITE, LEUKOCYTESUR in the last 72 hours.  Invalid input(s): APPERANCEUR     Imaging:  No results found.   Medications:      (feeding supplement) PROSource Plus  30 mL Oral BID BM   aspirin EC  81 mg Oral BID   calcitRIOL  0.25 mcg Oral Daily   guaiFENesin  600 mg Oral BID   heparin injection (subcutaneous)  5,000 Units Subcutaneous Q8H   levothyroxine  75 mcg Oral Q0600   liothyronine  5 mcg Oral Daily   midodrine  10 mg Oral TID WC   multivitamin with minerals  1 tablet Oral Daily   pravastatin  20 mg Oral QPM   torsemide  100 mg Oral Daily   acetaminophen **OR** acetaminophen, albuterol, hydrocerin, ondansetron **OR** ondansetron (ZOFRAN) IV, polyethylene glycol, traZODone  Assessment/ Plan:  [name] is a 88 y.o.  male with past medical history of hypertension, dyslipidemia, aortic aneurysm, and stage IV CKD.  Patient  presents from rehab facility with fluid overload.  With suspected to be dehydrated and prescribed IV fluids.  Patient states no shortness of breath but bilateral lower extremity and abdominal edema.  Patient has been admitted for Acute pulmonary edema (HCC) [J81.0] Acute CHF (congestive heart failure) (HCC) [I50.9] Urinary tract infection without hematuria, site unspecified [N39.0]   Acute Kidney Injury with hyponatremia on chronic kidney disease stage IV with baseline creatinine 3.54 and GFR of 16 on 10/14/21.  Acute kidney injury secondary to cardiorenal syndrome Chronic kidney disease secondary to hypertension and vascular disease. No IV contrast exposure. No acute indication for dialysis at this time No longer interested in dialysis - Continue torsemide 100mg  daily  - Sodium stable 131 - Palliative care consulted and recommends continued outpatient follow up with [place] - Will schedule follow up appt with nephrology at discharge.    Lab Results  Component Value Date   CREATININE 4.11 (H) 10/31/2021   CREATININE 4.09 (H) 10/30/2021   CREATININE 4.17 (H) 10/29/2021    Intake/Output Summary (Last 24 hours) at 10/31/2021 1217 Last data filed at 10/31/2021 0900 Gross per 24 hour  Intake 480 ml  Output 1200 ml  Net -720 ml    2.  Chronic systolic heart failure.  Echo from 10/03/2021 shows EF less than 20% with left ventricular hypokinesis.     LOS: 6 [place] 2/14/202312:17 PM

## 2021-10-31 NOTE — TOC Progression Note (Signed)
Transition of Care [facility]) - Progression Note    Patient Details  Name: [name] MRN: [id number] Date of Birth: 04-27-1931  Transition [facility]) CM/SW Contact  Anselm Pancoast, RN Phone Number: 10/31/2021, 4:21 PM  Clinical Narrative:    [facility] supervisor called and spoke to HTA-medical director regarding insurance denial for SNF placement. Discussed plans concerns related to patients ability to tolerate rehab given medical diagnoses and denial remains in place for SNF request. Family has the right to appeal.    Expected Discharge Plan: Skilled Nursing Facility Barriers to Discharge: Continued Medical Work up  Expected Discharge Plan and Services Expected Discharge Plan: [place] arrangements for the past 2 months: [place]                                       Social Determinants of Health (SDOH) Interventions    Readmission Risk Interventions Readmission Risk Prevention Plan 05/31/2021  Transportation Screening Complete  PCP or Specialist Appt within 3-5 Days Complete  Social Work Consult for [place] Planning/Counseling Complete  Palliative Care Screening Not Applicable  Medication Review Press photographer) Complete  Some recent data might be hidden

## 2021-10-31 NOTE — Care Management Important Message (Signed)
Important Message  Patient Details  Name: [name] MRN: [id number] Date of Birth: 05/05/1931   Medicare Important Message Given:  Yes     Juliann Pulse A Nancye Grumbine 10/31/2021, 2:18 PM

## 2021-10-31 NOTE — TOC Progression Note (Addendum)
Transition of Care [facility]) - Progression Note    Patient Details  Name: [name] MRN: [id number] Date of Birth: [birth year]  Transition of Care [facility]) CM/SW Contact  Eileen Stanford, LCSW Phone Number: 10/31/2021, 9:28 AM  Clinical Narrative:   Per HTA [facility] is still pending. CSW provided number for them to call once there is a decision made. Per Magda Paganini with [employer] pt can come today if we get auth.  10:35- pt's EMS transport has been approved (#[id number]) however SNF [facility] has been denied- peer to peer information given to MD.  Expected Discharge Plan: [place] Barriers to Discharge: Continued Medical Work up  Expected Discharge Plan and Services Expected Discharge Plan: [place] arrangements for the past 2 months: [place]                                       Social Determinants of Health (SDOH) Interventions    Readmission Risk Interventions Readmission Risk Prevention Plan 05/31/2021  Transportation Screening Complete  PCP or Specialist Appt within 3-5 Days Complete  Social Work Consult for [place] Planning/Counseling Complete  Palliative Care Screening Not Applicable  Medication Review Press photographer) Complete  Some recent data might be hidden

## 2021-10-31 NOTE — Progress Notes (Addendum)
Progress Note   Patient: [name] YTK:[id number] DOB: 02/10/1931 DOA: 10/25/2021     6 DOS: the patient was seen and examined on 10/31/2021       Brief hospital course: Mr. Fernandez is an 3 yr. M with CKD V, baseline 3.9, sCHF EF <20% due to ischemic CM, AS and CAD s/p CABG and AVR 2011, pAF not on AC, PVD and recent life-threatening myxedema coma who presented from SNF with abnormal labs.  Prior to admission, patient had recently been discharged from hospital to SNF after myxedema crisis, had been at [facility] for about 10 days prior to admission, felt like he was "getting better".  Able to get out of bed, more alert.  In the last few days prior to admission he was told that he had "dehydration", so was given IV fluids at the facility.  Then sent to the ER for "dehydration".  In the ER, creatinine up to 4.4, up from 3.5 at discharge.  He had peripheral edema, BNP greater than 4500, and chest x-ray with worsened bilateral airspace opacities.  Started on Lasix and admitted.       Assessment and Plan: * Acute on chronic systolic CHF (congestive heart failure) ([place])- (present on admission) Net negative 1.6L yesterday, Cr stable, BUN yesterday likely erroneous.    Completed 3 days high dose torsemide, appears euvolemic. - Transition to torsemide 40 daily - Start metolazone 2.5 twice weekly   - Consult Nephrology, appreciate expertise       Uremia See above  CKD (chronic kidney disease), stage V ([place])- (present on admission) Creatinine improved with diuresis.  Likely his worsening renal function at his facility was congestion not 'dehydration'.  Patient has declined dialysis prior to now and again now.  I suspect that this fluid overload can be managed with high-dose diuretics and acidosis with bicarb.  Unfortunately his BUN is up to 140s and he appears to be starting to get symptomatic with itching and sleepiness and I suspect that this will be a life limiting factor for him if  he does not do dialysis. - Consult nephrology - Consult Palliative Care  Hypothyroidism Recently nearly died from Myxedema coma.  Clearly improving from the standpoint of thyroid failure, but overall prognosis is still dire given the underlying pre-existing chronic renal and cardiac failure.  TSH improving as expected.  -Continue levothyroxine, liothyronine - If consistent with goals of care, would recommend Endocrinology follow up after discharge      Goals of care, counseling/discussion As outlined above, the patient had advanced renal and cardiac failure prior to his myxedema event, now compounded by that.  Seemed to be improving from thyroid failure, actually, and was in rehab with Cr down to 3.5, but then Cr started trending up.  Here, dialysis was discussed again, patient made clear he did not wish to proceed with this.  In light of that, Nephrology recommended medical management.  They advised patient, and I agree, that in that situation he likely has less than 6 months life expectancy (this is also true if he were to do dialysis, in fact).  Any reduction in his diuretics will quickly result in congestion.  Likely uremia will be the limiting factor in his achieving a reasonable quality of life.  Insurance have declined to pay for acute rehab services.  Family are in the process of deciding whether to self-pay or return home.  We will support family in whatever they choose. Hospice referral has been offered if family elect to  use this resource although this is clearly at their preference.  Schistocytes on peripheral blood smear Incidental finding on admission.  In setting of AV graft, and without anemia or thrombocytopenia, I have low suspicion for MAHA.  CBC has remained normal.   MAHA ruled out.  No further work up necessary.  Anemia in other chronic diseases classified elsewhere- (present on admission) Hgb stable, no clinical bleeding  AF (paroxysmal atrial fibrillation) ([place])-  (present on admission) Not on anticoagulation due to pre-existing factors. -Continue low dose aspirin  Pressure injury of buttock, stage 2 ([place])- (present on admission) - Local wound care  Benign essential HTN- (present on admission) BP low normal - Hold losartan - Continue midodrine - Continue furosemide               Subjective: No change in dyspnea, no new swelling.  No chest pain.  Still somewhat fatigued, itchy, sleepy.     Physical Exam: Vitals:   10/30/21 2107 10/31/21 0631 10/31/21 0819 10/31/21 1240  BP: 101/71 98/65 105/73 99/76  Pulse: 62 (!) 58 (!) 59 62  Resp: 16 16 16 17   Temp: (!) 97.5 F (36.4 C) (!) 97.5 F (36.4 C) (!) 97.4 F (36.3 C) 98 F (36.7 C)  TempSrc: Oral Oral Oral   SpO2: 97% 97% 99% 99%  Weight:      Height:       Elderly adult male, eating breakfast, sitting up in bed, appears tired Crackles in anterior lung fields, lung sounds clear in the posterior, good air movement, normal respiratory effort. No lower extremity edema, I do not appreciate the JVP, heart rate normal, no systolic murmur Abdominal exam without tenderness or guarding Mentation sleepy, moderate psychomotor slowing.  Generalized weakness       Data Reviewed: Discussed with nephrology My review of labs is notable for creatinine unchanged from yesterday, BUN unchanged from 2 days ago      Family Communication:    Disposition: Status is: Inpatient Remains inpatient appropriate because: The patient is medically cleared for discharge to skilled nursing facility as soon as a bed is available          Planned Discharge Destination: Skilled nursing facility        Author: Edwin Dada, MD 10/31/2021 4:05 PM  For on call review [email or web address].

## 2021-10-31 NOTE — Progress Notes (Signed)
Physical Therapy Treatment Patient Details Name: [name] MRN: [id number] DOB: [birth year] Today's Date: 10/31/2021   History of Present Illness Pt is an 88 y.o male with significant PMH of  CKD stage IV, hypertension, proteinuria, hyperparathyroidism, HLD, aspiration pneumonia, EF 15-20%, ischemic cardiomyopathy, bilateral carotid stenosis, severe aortic stenosis, AAA, MGUS, paroxysmal SVT, CABG, PDA, status post bioprosthetic valve replacement in 2011 who presented to the ED with unresponsiveness. MD assessment includes: Myxedema coma, cadiogenic shock, acute on chronic CHF, AKI, thrombocytopenia, anemia, A-fib, acute respiratory failure, pressure injury of the buttock stage 2, and hyponatremia.    PT Comments    Pt seen this pm, agreed to OOB activity. Pt on 2L O2 with sats at 96% at rest. MinA to transfer to EOB with HOB raised. Pt tolerated ~10 minutes upright sitting with single UE support. Sit to stand with MinA, small steps to bedside recliner with RW and CGA.  Pt positioned to comfort for lunch. SOB noted while eating, O2 sats 92%. Pt educated on pacing self and PLB technique.  Pt showed increased tolerance for activity this session. Anticipate 24 hour assistance will be needed for safety upon d/c. Continue to recommend SNF at this time.   Recommendations for follow up therapy are one component of a multi-disciplinary discharge planning process, led by the attending physician.  Recommendations may be updated based on patient status, additional functional criteria and insurance authorization.  Follow Up Recommendations  Skilled nursing-short term rehab (<3 hours/day)     Assistance Recommended at Discharge Frequent or constant Supervision/Assistance  Patient can return home with the following A little help with walking and/or transfers   Equipment Recommendations  Other (comment) (Depends on d/c destination SNF vs home with Hospice)    Recommendations for Other Services        Precautions / Restrictions Precautions Precautions: Fall Restrictions Weight Bearing Restrictions: No     Mobility  Bed Mobility Overal bed mobility: Needs Assistance Bed Mobility: Supine to Sit Rolling: Min assist Sidelying to sit: Min assist, HOB elevated Supine to sit: Min assist, Mod assist          Transfers Overall transfer level: Needs assistance Equipment used: Rolling walker (2 wheels) Transfers: Sit to/from Stand Sit to Stand: Min assist           General transfer comment: Min vc's to complete task, pt able to attain upright stance at [employer]    Ambulation/Gait Ambulation/Gait assistance: Min guard Gait Distance (Feet): 3 Feet Assistive device: Rolling walker (2 wheels) Gait Pattern/deviations: Step-to pattern, Shuffle       General Gait Details: Pt donned his socks and shoes for OOB due to leg length descrepency.   Stairs             Wheelchair Mobility    Modified Rankin (Stroke Patients Only)       Balance Overall balance assessment: Needs assistance Sitting-balance support: Feet supported Sitting balance-Leahy Scale: Fair Sitting balance - Comments:  (Pt sat EOB x 10 minutes with single UE support)                                    Cognition Arousal/Alertness: Awake/alert Behavior During Therapy: WFL for tasks assessed/performed Overall Cognitive Status: Within Functional Limits for tasks assessed  General Comments: Very pleasant and anxious to get out of bed        Exercises Total Joint Exercises Ankle Circles/Pumps: AROM, Strengthening, Both, 10 reps Long Arc Quad: AROM, Strengthening, Both, 10 reps General Exercises - Lower Extremity Hip Flexion/Marching: AROM, Both, 10 reps, Seated    General Comments General comments (skin integrity, edema, etc.): Pt noted to have small skin tear at sacral area, nursing notified.      Pertinent Vitals/Pain Pain  Assessment Pain Assessment: No/denies pain    Home Living                          Prior Function            PT Goals (current goals can now be found in the care plan section) Acute Rehab PT Goals Patient Stated Goal: get stronger, per patient "I was doing so much better than this"    Frequency    Min 2X/week      PT Plan Current plan remains appropriate    Co-evaluation              AM-PAC PT "6 Clicks" Mobility   Outcome Measure  Help needed turning from your back to your side while in a flat bed without using bedrails?: A Little Help needed moving from lying on your back to sitting on the side of a flat bed without using bedrails?: A Little Help needed moving to and from a bed to a chair (including a wheelchair)?: A Little Help needed standing up from a chair using your arms (e.g., wheelchair or bedside chair)?: A Little Help needed to walk in hospital room?: A Lot Help needed climbing 3-5 steps with a railing? : A Lot 6 Click Score: 16    End of Session         PT Visit Diagnosis: Unsteadiness on feet (R26.81);Difficulty in walking, not elsewhere classified (R26.2);Muscle weakness (generalized) (M62.81)     Time: 4097-3532 PT Time Calculation (min) (ACUTE ONLY): 56 min  Charges:  $Therapeutic Exercise: 8-22 mins $Therapeutic Activity: 38-52 mins                    Mikel Cella, PTA    Josie Dixon 10/31/2021, 1:22 PM

## 2021-11-01 DIAGNOSIS — D638 Anemia in other chronic diseases classified elsewhere: Secondary | ICD-10-CM | POA: Diagnosis not present

## 2021-11-01 DIAGNOSIS — N390 Urinary tract infection, site not specified: Secondary | ICD-10-CM | POA: Diagnosis not present

## 2021-11-01 DIAGNOSIS — L89302 Pressure ulcer of unspecified buttock, stage 2: Secondary | ICD-10-CM

## 2021-11-01 DIAGNOSIS — I480 Paroxysmal atrial fibrillation: Secondary | ICD-10-CM | POA: Diagnosis not present

## 2021-11-01 DIAGNOSIS — E038 Other specified hypothyroidism: Secondary | ICD-10-CM

## 2021-11-01 DIAGNOSIS — I5023 Acute on chronic systolic (congestive) heart failure: Secondary | ICD-10-CM | POA: Diagnosis not present

## 2021-11-01 LAB — CBC
HCT: 27.7 % — ABNORMAL LOW (ref 39.0–52.0)
Hemoglobin: 8.9 g/dL — ABNORMAL LOW (ref 13.0–17.0)
MCH: 34.8 pg — ABNORMAL HIGH (ref 26.0–34.0)
MCHC: 32.1 g/dL (ref 30.0–36.0)
MCV: 108.2 fL — ABNORMAL HIGH (ref 80.0–100.0)
Platelets: 143 10*3/uL — ABNORMAL LOW (ref 150–400)
RBC: 2.56 MIL/uL — ABNORMAL LOW (ref 4.22–5.81)
RDW: 18.6 % — ABNORMAL HIGH (ref 11.5–15.5)
WBC: 4 10*3/uL (ref 4.0–10.5)
nRBC: 0 % (ref 0.0–0.2)

## 2021-11-01 LAB — BASIC METABOLIC PANEL
Anion gap: 10 (ref 5–15)
BUN: 143 mg/dL — ABNORMAL HIGH (ref 8–23)
CO2: 24 mmol/L (ref 22–32)
Calcium: 8.7 mg/dL — ABNORMAL LOW (ref 8.9–10.3)
Chloride: 95 mmol/L — ABNORMAL LOW (ref 98–111)
Creatinine, Ser: 4.06 mg/dL — ABNORMAL HIGH (ref 0.61–1.24)
GFR, Estimated: 13 mL/min — ABNORMAL LOW (ref >60)
Glucose, Bld: 88 mg/dL (ref 70–99)
Potassium: 5.3 mmol/L — ABNORMAL HIGH (ref 3.5–5.1)
Sodium: 129 mmol/L — ABNORMAL LOW (ref 135–145)

## 2021-11-01 MED ORDER — SODIUM ZIRCONIUM CYCLOSILICATE 5 G PO PACK
5.0000 g | PACK | Freq: Once | ORAL | Status: AC
  Administered 2021-11-01: 5 g via ORAL
  Filled 2021-11-01: qty 1

## 2021-11-01 NOTE — Progress Notes (Signed)
Physical Therapy Treatment Patient Details Name: [name] MRN: [id number] DOB: August 14, 1931 Today's Date: 11/01/2021   History of Present Illness Pt is an 88 y.o male with significant PMH of  CKD stage IV, hypertension, proteinuria, hyperparathyroidism, HLD, aspiration pneumonia, EF 15-20%, ischemic cardiomyopathy, bilateral carotid stenosis, severe aortic stenosis, AAA, MGUS, paroxysmal SVT, CABG, PDA, status post bioprosthetic valve replacement in 2011 who presented to the ED with unresponsiveness. MD assessment includes: Myxedema coma, cadiogenic shock, acute on chronic CHF, AKI, thrombocytopenia, anemia, A-fib, acute respiratory failure, pressure injury of the buttock stage 2, and hyponatremia.    PT Comments    Pt was long sitting in bed on 2 L o2 upon arriving. He is somewhat lethargic however does agree to session. " Why am I so SOB today?" Author checked pt's sao2 and he was resting at 84%. He quickly desaturates to 81% when going from supine to sit EOB. O2 increased to 5 L during session to maintain > 88%. Pt is motivated however struggles staying awake during session. Stood a few times EOB but due to fatigue, can only stand ~ 1 minute before needing prolonged recovery in sitting. Pt did take several steps form FOB > HOB with assistance. He sits quickly and requested to return to supine due to fatigue. Pt was much more SOB and fatigued quicker than last time observed by author.  Acute PT will continue to follow and progress as able per current POC. Highly recommend DC to SNF however if unable, will need extensive equipment to be able to safely manage at home.    Recommendations for follow up therapy are one component of a multi-disciplinary discharge planning process, led by the attending physician.  Recommendations may be updated based on patient status, additional functional criteria and insurance authorization.  Follow Up Recommendations  Skilled nursing-short term rehab (<3  hours/day)     Assistance Recommended at Discharge Frequent or constant Supervision/Assistance  Patient can return home with the following A little help with walking and/or transfers;A little help with bathing/dressing/bathroom;A lot of help with bathing/dressing/bathroom;Assistance with cooking/housework;Assistance with feeding;Direct supervision/assist for medications management;Direct supervision/assist for financial management;Assist for transportation;Help with stairs or ramp for entrance   Equipment Recommendations  Rolling walker (2 wheels);Wheelchair (measurements PT);Wheelchair cushion (measurements PT);Hospital bed;BSC/3in1 (will need extensive equipment if unable to go to rehab!)       Precautions / Restrictions Precautions Precautions: Fall Restrictions Weight Bearing Restrictions: No     Mobility  Bed Mobility Overal bed mobility: Needs Assistance Bed Mobility: Supine to Sit Rolling: Min assist Sidelying to sit: Min assist, HOB elevated Supine to sit: Min assist Sit to supine: Min assist   General bed mobility comments: Pt was able to exit L side of bed with increased time + min assist. He stood twice ~ 1 minute each time. Pt gets SOB quickly and required prolonged recovery time. He gets somewhat more lethargic and required quickly returning to bed. Pt is extremely deconditioned/fatigued from minimal activity.    Transfers Overall transfer level: Needs assistance Equipment used: Rolling walker (2 wheels) Transfers: Sit to/from Stand Sit to Stand: Min assist           General transfer comment: Pt was able to stand 2 x EOB to RW with min assist form minimally elevated bed height. session greatly limited by fatigue.    Ambulation/Gait Ambulation/Gait assistance: Min guard Gait Distance (Feet): 3 Feet Assistive device: Rolling walker (2 wheels) Gait Pattern/deviations: Step-to pattern, Shuffle Gait velocity: decreased  General Gait Details: took 3 steps  from FOB> [facility] however is severely limited by fatigue.    Balance Overall balance assessment: Needs assistance Sitting-balance support: Feet supported Sitting balance-Leahy Scale: Fair     Standing balance support: Bilateral upper extremity supported, During functional activity Standing balance-Leahy Scale: Fair Standing balance comment: is reliant on RW for dynamic balance      Cognition Arousal/Alertness: Lethargic Behavior During Therapy: WFL for tasks assessed/performed Overall Cognitive Status: Within Functional Limits for tasks assessed      General Comments: Pt is lethargic but agreeable to session. He was on 2 L upon arriving however required increased O2 supply to maintain > 88%.left on 5 L to keep above 88%           General Comments General comments (skin integrity, edema, etc.): Pt is severely SOB/fatigued with minimal activity.      Pertinent Vitals/Pain Pain Assessment Pain Assessment: No/denies pain     PT Goals (current goals can now be found in the care plan section) Acute Rehab PT Goals Patient Stated Goal: none stated Progress towards PT goals: Progressing toward goals    Frequency    Min 2X/week      PT Plan Current plan remains appropriate       AM-PAC PT "6 Clicks" Mobility   Outcome Measure  Help needed turning from your back to your side while in a flat bed without using bedrails?: A Little Help needed moving from lying on your back to sitting on the side of a flat bed without using bedrails?: A Little Help needed moving to and from a bed to a chair (including a wheelchair)?: A Little Help needed standing up from a chair using your arms (e.g., wheelchair or bedside chair)?: A Little Help needed to walk in hospital room?: A Lot Help needed climbing 3-5 steps with a railing? : A Lot 6 Click Score: 16    End of Session Equipment Utilized During Treatment: Oxygen (2 L prior/ 5 L post) Activity Tolerance: Patient limited by  fatigue;Patient limited by lethargy Patient left: in bed;with call bell/phone within reach;with bed alarm set Nurse Communication: Mobility status PT Visit Diagnosis: Unsteadiness on feet (R26.81);Difficulty in walking, not elsewhere classified (R26.2);Muscle weakness (generalized) (M62.81)     Time: 7412-8786 PT Time Calculation (min) (ACUTE ONLY): 23 min  Charges:  $Therapeutic Activity: 23-37 mins                    Julaine Fusi PTA 11/01/21, 2:02 PM

## 2021-11-01 NOTE — TOC Progression Note (Signed)
Transition of Care [facility]) - Progression Note    Patient Details  Name: [name] MRN: [id number] Date of Birth: 1931-01-04  Transition of [facility]) CM/SW Contact  Eileen Stanford, LCSW Phone Number: 11/01/2021, 3:36 PM  Clinical Narrative:  CSW spoke with pt's daughter to confirm whether or not a appeal has been started. Pt states she called and could not start a appeal "due to [place] laws." Stating that "me nor my brother are listed as able to make a appeal." Per Pt's daughter she is contacting his agent with [employer] to see if they can help. TOC Supervisor updated and will discuss during DTP tomorrow morning. MD updated.     Expected Discharge Plan: [place] Barriers to Discharge: Continued Medical Work up  Expected Discharge Plan and Services Expected Discharge Plan: [place] arrangements for the past 2 months: [place]                                       Social Determinants of Health (SDOH) Interventions    Readmission Risk Interventions Readmission Risk Prevention Plan 05/31/2021  Transportation Screening Complete  PCP or Specialist Appt within 3-5 Days Complete  Social Work Consult for [place] Planning/Counseling Complete  Palliative Care Screening Not Applicable  Medication Review [employer]) Complete  Some recent data might be hidden

## 2021-11-01 NOTE — Progress Notes (Signed)
[facility]  ROUNDING NOTE   Subjective:   [name] is a 88 year old with past medical history of hypertension, dyslipidemia, aortic aneurysm, and stage IV CKD.  Patient presents from rehab facility with fluid overloa.  With suspected to be dehydrated and prescribed IV fluids.  Patient states no shortness of breath but bilateral lower extremity and abdominal edema.  Patient has been admitted for Acute pulmonary edema (HCC) [J81.0] Acute CHF (congestive heart failure) ([place]) [I50.9] Urinary tract infection without hematuria, site unspecified [N39.0]  Patient is known to our practice and receives outpatient care from Dr. Holley Raring.    Patient sitting up in bed Alert and oriented Tolerating breakfast Denies shortness of breath  Objective:  Vital signs in last 24 hours:  Temp:  [97.4 F (36.3 C)-98 F (36.7 C)] 97.5 F (36.4 C) (02/15 1100) Pulse Rate:  [57-63] 58 (02/15 1100) Resp:  [15-19] 18 (02/15 1100) BP: (97-108)/(65-94) 98/65 (02/15 1100) SpO2:  [96 %-100 %] 96 % (02/15 0900) Weight:  [70.9 kg] 70.9 kg (02/15 0545)  Weight change:  Filed Weights   10/29/21 0500 10/30/21 0500 11/01/21 0545  Weight: 75.8 kg 76.1 kg 70.9 kg    Intake/Output: I/O last 3 completed shifts: In: 600 [P.O.:600] Out: 1200 [Urine:1200]   Intake/Output this shift:  Total I/O In: 360 [P.O.:360] Out: 700 [Urine:700]  Physical Exam: General: NAD, withdrawn  Head: Normocephalic, atraumatic. Moist oral mucosal membranes  Eyes: Anicteric  Lungs:  Crackles, normal effort, O2   Heart: Regular rate and rhythm  Abdomen:  Soft, nontender  Extremities:  trace peripheral edema.  Neurologic: Nonfocal, moving all four extremities  Skin: No lesions       Basic Metabolic Panel: Recent Labs  Lab 10/28/21 0500 10/29/21 0435 10/30/21 0509 10/31/21 0416 11/01/21 0407  NA 134* 129* 131* 131* 129*  K 4.9 4.7 4.8 4.9 5.3*  CL 99 96* 95* 97* 95*  CO2 20* 20* 21* 24 24  GLUCOSE 95  103* 99 94 88  BUN 147* 143* 99* 148* 143*  CREATININE 4.41* 4.17* 4.09* 4.11* 4.06*  CALCIUM 8.7* 8.5* 8.8* 8.8* 8.7*     Liver Function Tests: Recent Labs  Lab 10/25/21 2136 10/28/21 0500  AST 42* 27  ALT 61* 42  ALKPHOS 119 118  BILITOT 0.9 0.9  PROT 7.0 6.7  ALBUMIN 3.4* 3.1*    No results for input(s): LIPASE, AMYLASE in the last 168 hours. No results for input(s): AMMONIA in the last 168 hours.  CBC: Recent Labs  Lab 10/25/21 2136 10/26/21 0625 10/28/21 0500 11/01/21 0407  WBC 5.3 4.9 5.2 4.0  NEUTROABS 3.5  --   --   --   HGB 8.7* 8.5* 8.7* 8.9*  HCT 27.4* 26.1* 27.1* 27.7*  MCV 111.4* 108.3* 108.4* 108.2*  PLT 171 157 165 143*     Cardiac Enzymes: No results for input(s): CKTOTAL, CKMB, CKMBINDEX, TROPONINI in the last 168 hours.  BNP: Invalid input(s): POCBNP  CBG: No results for input(s): GLUCAP in the last 168 hours.  Microbiology: Results for orders placed or performed during the hospital encounter of 10/25/21  Blood culture (routine x 2)     Status: None   Collection Time: 10/25/21 11:41 PM   Specimen: BLOOD  Result Value Ref Range Status   Specimen Description BLOOD LEFT ASSIST CONTROL  Final   Special Requests   Final    BOTTLES DRAWN AEROBIC AND ANAEROBIC Blood Culture adequate volume   Culture   Final  NO GROWTH 5 DAYS Performed at [facility], [address] [place], [place] [address]    Report Status 10/31/2021 FINAL  Final  Resp Panel by RT-PCR (Flu A&B, Covid) Nasopharyngeal Swab     Status: None   Collection Time: 10/25/21 11:41 PM   Specimen: Nasopharyngeal Swab; Nasopharyngeal(NP) swabs in vial transport medium  Result Value Ref Range Status   SARS Coronavirus 2 by RT PCR NEGATIVE NEGATIVE Final    Comment: (NOTE) SARS-CoV-2 target nucleic acids are NOT DETECTED.  The SARS-CoV-2 RNA is generally detectable in upper respiratory specimens during the acute phase of infection. The lowest concentration of  SARS-CoV-2 viral copies this assay can detect is 138 copies/mL. A negative result does not preclude SARS-Cov-2 infection and should not be used as the sole basis for treatment or other patient management decisions. A negative result may occur with  improper specimen collection/handling, submission of specimen other than nasopharyngeal swab, presence of viral mutation(s) within the areas targeted by this assay, and inadequate number of viral copies(<138 copies/mL). A negative result must be combined with clinical observations, patient history, and epidemiological information. The expected result is Negative.  Fact Sheet for Patients:  EntrepreneurPulse.com.au  Fact Sheet for Healthcare Providers:  IncredibleEmployment.be  This test is no t yet approved or cleared by the [place] FDA and  has been authorized for detection and/or diagnosis of SARS-CoV-2 by FDA under an Emergency Use Authorization (EUA). This EUA will remain  in effect (meaning this test can be used) for the duration of the COVID-19 declaration under Section 564(b)(1) of the Act, 21 U.S.C.section 360bbb-3(b)(1), unless the authorization is terminated  or revoked sooner.       Influenza A by PCR NEGATIVE NEGATIVE Final   Influenza B by PCR NEGATIVE NEGATIVE Final    Comment: (NOTE) The Xpert Xpress SARS-CoV-2/FLU/RSV plus assay is intended as an aid in the diagnosis of influenza from Nasopharyngeal swab specimens and should not be used as a sole basis for treatment. Nasal washings and aspirates are unacceptable for Xpert Xpress SARS-CoV-2/FLU/RSV testing.  Fact Sheet for Patients: EntrepreneurPulse.com.au  Fact Sheet for Healthcare Providers: IncredibleEmployment.be  This test is not yet approved or cleared by the [place] FDA and has been authorized for detection and/or diagnosis of SARS-CoV-2 by FDA under an Emergency Use  Authorization (EUA). This EUA will remain in effect (meaning this test can be used) for the duration of the COVID-19 declaration under Section 564(b)(1) of the Act, 21 U.S.C. section 360bbb-3(b)(1), unless the authorization is terminated or revoked.  Performed at [facility], [address] [place], [place] [address]   Blood culture (routine x 2)     Status: None   Collection Time: 10/25/21 11:43 PM   Specimen: BLOOD  Result Value Ref Range Status   Specimen Description BLOOD LEFT ASSIST CONTROL  Final   Special Requests   Final    BOTTLES DRAWN AEROBIC AND ANAEROBIC Blood Culture adequate volume   Culture   Final    NO GROWTH 5 DAYS Performed at [facility], [address] [place], [place] [address]    Report Status 10/31/2021 FINAL  Final  Resp Panel by RT-PCR (Flu A&B, Covid) Nasopharyngeal Swab     Status: None   Collection Time: 10/31/21  1:35 PM   Specimen: Nasopharyngeal Swab; Nasopharyngeal(NP) swabs in vial transport medium  Result Value Ref Range Status   SARS Coronavirus 2 by RT PCR NEGATIVE NEGATIVE Final    Comment: (NOTE) SARS-CoV-2  target nucleic acids are NOT DETECTED.  The SARS-CoV-2 RNA is generally detectable in upper respiratory specimens during the acute phase of infection. The lowest concentration of SARS-CoV-2 viral copies this assay can detect is 138 copies/mL. A negative result does not preclude SARS-Cov-2 infection and should not be used as the sole basis for treatment or other patient management decisions. A negative result may occur with  improper specimen collection/handling, submission of specimen other than nasopharyngeal swab, presence of viral mutation(s) within the areas targeted by this assay, and inadequate number of viral copies(<138 copies/mL). A negative result must be combined with clinical observations, patient history, and epidemiological information. The expected result is Negative.  Fact Sheet for Patients:   EntrepreneurPulse.com.au  Fact Sheet for Healthcare Providers:  IncredibleEmployment.be  This test is no t yet approved or cleared by the [place] FDA and  has been authorized for detection and/or diagnosis of SARS-CoV-2 by FDA under an Emergency Use Authorization (EUA). This EUA will remain  in effect (meaning this test can be used) for the duration of the COVID-19 declaration under Section 564(b)(1) of the Act, 21 U.S.C.section 360bbb-3(b)(1), unless the authorization is terminated  or revoked sooner.       Influenza A by PCR NEGATIVE NEGATIVE Final   Influenza B by PCR NEGATIVE NEGATIVE Final    Comment: (NOTE) The Xpert Xpress SARS-CoV-2/FLU/RSV plus assay is intended as an aid in the diagnosis of influenza from Nasopharyngeal swab specimens and should not be used as a sole basis for treatment. Nasal washings and aspirates are unacceptable for Xpert Xpress SARS-CoV-2/FLU/RSV testing.  Fact Sheet for Patients: EntrepreneurPulse.com.au  Fact Sheet for Healthcare Providers: IncredibleEmployment.be  This test is not yet approved or cleared by the [place] FDA and has been authorized for detection and/or diagnosis of SARS-CoV-2 by FDA under an Emergency Use Authorization (EUA). This EUA will remain in effect (meaning this test can be used) for the duration of the COVID-19 declaration under Section 564(b)(1) of the Act, 21 U.S.C. section 360bbb-3(b)(1), unless the authorization is terminated or revoked.  Performed at [facility], [address] [place], [place] [address]     Coagulation Studies: No results for input(s): LABPROT, INR in the last 72 hours.  Urinalysis: No results for input(s): COLORURINE, LABSPEC, PHURINE, GLUCOSEU, HGBUR, BILIRUBINUR, KETONESUR, PROTEINUR, UROBILINOGEN, NITRITE, LEUKOCYTESUR in the last 72 hours.  Invalid input(s): APPERANCEUR      Imaging: No results found.   Medications:      (feeding supplement) PROSource Plus  30 mL Oral BID BM   aspirin EC  81 mg Oral BID   calcitRIOL  0.25 mcg Oral Daily   guaiFENesin  600 mg Oral BID   heparin injection (subcutaneous)  5,000 Units Subcutaneous Q8H   levothyroxine  75 mcg Oral Q0600   liothyronine  5 mcg Oral Daily   [START ON 11/03/2021] metolazone  2.5 mg Oral Once per day on Mon Fri   midodrine  10 mg Oral TID WC   multivitamin with minerals  1 tablet Oral Daily   pravastatin  20 mg Oral QPM   torsemide  40 mg Oral Daily   acetaminophen **OR** acetaminophen, albuterol, hydrocerin, ondansetron **OR** ondansetron (ZOFRAN) IV, polyethylene glycol, traZODone  Assessment/ Plan:  [name] is a 88 y.o.  male with past medical history of hypertension, dyslipidemia, aortic aneurysm, and stage IV CKD.  Patient presents from rehab facility with fluid overload.  With suspected to be dehydrated and prescribed IV fluids.  Patient states no shortness of breath but bilateral lower extremity and abdominal edema.  Patient has been admitted for Acute pulmonary edema (HCC) [J81.0] Acute CHF (congestive heart failure) (HCC) [I50.9] Urinary tract infection without hematuria, site unspecified [N39.0]   Acute Kidney Injury with hyponatremia on chronic kidney disease stage IV with baseline creatinine 3.54 and GFR of 16 on 10/14/21.  Acute kidney injury secondary to cardiorenal syndrome Chronic kidney disease secondary to hypertension and vascular disease. No IV contrast exposure. No acute indication for dialysis at this time No longer interested in dialysis - Continue torsemide 100mg  daily  - Sodium 129 - Palliative care consulted and recommends continued outpatient follow up with [facility].  - unable to go to rehab, current recommendation for home with hospice. Daughter refusing this discharge plan. We are coming to a point where medication management has been exhausted with  dialysis refusal.   Lab Results  Component Value Date   CREATININE 4.06 (H) 11/01/2021   CREATININE 4.11 (H) 10/31/2021   CREATININE 4.09 (H) 10/30/2021    Intake/Output Summary (Last 24 hours) at 11/01/2021 1220 Last data filed at 11/01/2021 1012 Gross per 24 hour  Intake 600 ml  Output 700 ml  Net -100 ml    2.  Chronic systolic heart failure.  Echo from 10/03/2021 shows EF less than 20% with left ventricular hypokinesis.     LOS: [place] 2/15/202312:20 PM

## 2021-11-01 NOTE — Progress Notes (Addendum)
Progress Note   Patient: [name] ACZ:[id number] DOB: [birth year] DOA: 10/25/2021     7 DOS: the patient was seen and examined on 11/01/2021    Brief hospital course: [name] is an 41 yr. M with CKD V, baseline 3.9, sCHF EF <20% due to ischemic CM, AS and CAD s/p CABG and AVR 2011, pAF not on AC, PVD and recent life-threatening myxedema coma who presented from SNF with abnormal labs.   Prior to admission, patient had recently been discharged from hospital to SNF after myxedema crisis, had been at [name] for about 10 days prior to admission, felt like he was "getting better".  Able to get out of bed, more alert.   In the last few days prior to admission he was told that he had "dehydration", so was given IV fluids at the facility.  Then sent to the ER for "dehydration".   In the ER, creatinine up to 4.4, up from 3.5 at discharge.  He had peripheral edema, BNP greater than 4500, and chest x-ray with worsened bilateral airspace opacities.  Started on Lasix and admitted.  Assessment and Plan:  Acute on chronic systolic CHF ([name])- (present on admission) appears euvolemic. - torsemide 40 daily - metolazone 2.5 twice weekly   - Consult Nephrology, appreciate expertise  Mild hyperkalemia-  - lokelma x1 - BMP am  Hypothyroidism Recently nearly died from Myxedema coma.  Clearly improving from the standpoint of thyroid failure, but overall prognosis is still dire given the underlying pre-existing chronic renal and cardiac failure.  TSH improving as expected. -Continue levothyroxine, liothyronine - If consistent with goals of care, would recommend Endocrinology follow up after discharge  Schistocytes on peripheral blood smear-  CBC has remained normal.   MAHA ruled out.  No further work up necessary.  Anemia in other chronic diseases classified elsewhere- (present on admission) Hgb stable, no clinical bleeding  AF (paroxysmal atrial fibrillation) ([name])- (present on admission) Not on  anticoagulation due to pre-existing factors. -Continue low dose aspirin  Pressure injury of buttock, stage 2 ([name])- (present on admission) - Local wound care  CKD (chronic kidney disease), stage V ([name])- (present on admission) Creatinine gradually improved with diuresis.  Likely his worsening renal function at his facility was congestion not 'dehydration'.  Patient continues to decline dialysis which will limit life expectancy. Almost 2L UOP yesterday - nephrology following, appreciate recs - palliative consulted, appreciate recs  H/o Essential HTN- hypotensive since admission - Hold losartan - Continue midodrine - Continue torsemide  Goals of care- disposition is undecided from family standpoint. Insurance denied SNF. Family and patient have been presented with options for discharge. Daughter is appealing decision.   Subjective: patient reports doing fine. He has no complaints today. He is inquiring about discharge plan.  Physical Exam: Vitals:   10/31/21 1240 10/31/21 2023 11/01/21 0040 11/01/21 0545  BP: 99/76 (!) 108/94 97/68 107/72  Pulse: 62 61 (!) 57 62  Resp: 17 19 15 18   Temp: 98 F (36.7 C) (!) 97.5 F (36.4 C) (!) 97.4 F (36.3 C)   TempSrc:  Oral Oral   SpO2: 99% 97% 98% 100%  Weight:    70.9 kg  Height:       General: NAD, able to participate in exam Cardiac: RRR, normal heart sounds, no murmurs. Respiratory:normal effort Extremities: bilateral LE edema. WWP. Skin: warm and dry, no rashes noted on exposed skin Neuro: alert and oriented, no focal deficits Psych: flat affect and mood  Data Reviewed: Cr  4.11>4.06 GFR 13, unchanged  Family Communication:  wife on phone  Disposition: Status is: Inpatient Remains inpatient appropriate because: The patient is medically cleared for discharge to skilled nursing facility as soon as a bed is available   Planned Discharge Destination: Skilled nursing facility  VTE Ppx: heparin injection 5,000 Units Start:  10/26/21 0830   Author: Richarda Osmond, MD 11/01/2021 7:31 AM  For on call review [email or web address].

## 2021-11-01 NOTE — TOC Progression Note (Signed)
Transition of Care [facility]) - Progression Note    Patient Details  Name: MASON DIBIASIO MRN: [id number] Date of Birth: 16-Mar-1931  [facility] CM/SW Contact  Eileen Stanford, LCSW Phone Number: 11/01/2021, 11:13 AM  Clinical Narrative:   Spoke with Daughter and she asked for the HTA number. Daughter is calling again to make one more attempt to change their decision. CSW explained the home with home health option because again pt's is refusing hospice services. Pt's daughter is not opposed to dc home with home health--if insurance does not change decision stating "do what you have to do." CSW will put out feelers to see if a agency will accept pt for hh.     Expected Discharge Plan: [place] Barriers to Discharge: Continued Medical Work up  Expected Discharge Plan and Services Expected Discharge Plan: [place] arrangements for the past 2 months: [place]                                       Social Determinants of Health (SDOH) Interventions    Readmission Risk Interventions Readmission Risk Prevention Plan 05/31/2021  Transportation Screening Complete  PCP or Specialist Appt within 3-5 Days Complete  Social Work Consult for [place] Planning/Counseling Complete  Palliative Care Screening Not Applicable  Medication Review [employer]) Complete  Some recent data might be hidden

## 2021-11-01 NOTE — Consult Note (Signed)
[facility] Consultation Note  Patient ID: [name], MRN: [id number], DOB/AGE: 19-Aug-1931 88 y.o. Admit date: 10/25/2021   Date of Consult: 11/01/2021 Primary Physician: Baxter Hire, MD Primary Cardiologist: Nehemiah Massed  Chief Complaint:  Chief Complaint  Patient presents with   Dehydration   Reason for Consult:  Heart failure  HPI: 88 y.o. male with known coronary artery disease status postcoronary artery bypass grafting and aortic valve replacement in the remote past with peripheral vascular disease hypertension hyperlipidemia and progressive issues including severe chronic kidney disease stage V with glomerular filtration rate of 12 as well as severe LV systolic dysfunction.  The patient has had multiple admissions to the hospital over the last many months to treat his acute on chronic systolic dysfunction congestive heart failure.  This is typically exacerbated by the above issues including higher salt diet and concerns for not perfect compliance.  Additionally, the concerns are that the patient is unable to get the appropriate outside care.  With his last visit, the patient and the family agreed that he would not go on dialysis and would try to use medication management for his chronic kidney disease stage V.  Additionally, he has not been able to use any medication management for LV systolic dysfunction with ejection fraction less than 20%.  This is due to hypotension and weakness.  He has remained on pravastatin for hyperlipidemia.  Primarily he has had treatment with diuresis with multiple different regimens medications and currently has been relatively stable through the help with nephrology.  This may be able to be maintained as an outpatient with very close management with healthcare team.  This includes nephrology, cardiology, and/or an outside facility with home health.  It is unclear to me whether home health or other treatment options are the best.  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   Periprosthetic fracture around internal prosthetic hip joint 08/05/2020   Pressure injury of skin 08/07/2020      Surgical History:  Past Surgical History:  Procedure Laterality Date   AORTIC VALVE REPLACEMENT     bipass     CARDIAC VALVE REPLACEMENT     pig valve   CHOLECYSTECTOMY     Open   COLON SURGERY     History of Hartman's procedure with subsequent colostomy reversal   CORONARY ANGIOPLASTY     CORONARY ARTERY BYPASS GRAFT     ESOPHAGOGASTRODUODENOSCOPY (EGD) WITH PROPOFOL N/A 02/07/2015   Procedure: ESOPHAGOGASTRODUODENOSCOPY (EGD) WITH PROPOFOL;  Surgeon: Hulen Luster, MD;  Location: [facility] ENDOSCOPY;  Service: Gastroenterology;  Laterality: N/A;   EYE SURGERY     HERNIA REPAIR     INTRAMEDULLARY (IM) NAIL INTERTROCHANTERIC Right 05/12/2020   Procedure: INTRAMEDULLARY (IM) NAIL INTERTROCHANTRIC;  Surgeon: Lovell Sheehan, MD;  Location: [facility] ORS;  Service: Orthopedics;  Laterality: Right;   JOINT REPLACEMENT  left knee x4   KNEE ARTHROPLASTY Right 04/02/2016   Procedure: COMPUTER ASSISTED TOTAL KNEE ARTHROPLASTY;  Surgeon: Dereck Leep, MD;  Location: [facility] ORS;  Service: Orthopedics;  Laterality: Right;   ORIF FEMUR FRACTURE Right 08/08/2020   Procedure: OPEN REDUCTION INTERNAL FIXATION (ORIF) DISTAL FEMUR FRACTURE;  Surgeon: Lovell Sheehan, MD;  Location: [facility] ORS;  Service: Orthopedics;  Laterality: Right;      Home Meds: Prior to Admission medications   Medication Sig Start Date End Date Taking? Authorizing Provider  aspirin EC 81 MG tablet Take 1 tablet (81 mg total) by mouth 2 (two) times daily. Swallow whole. 08/10/20  Yes Donne Hazel, MD  calcitRIOL (ROCALTROL) 0.25 MCG capsule Take 0.25 mcg by mouth daily. 07/03/21  Yes [provider]  levothyroxine (SYNTHROID) 75 MCG tablet Take 1 tablet (75 mcg total) by mouth daily at 6 (six) AM. 10/13/21 11/12/21 Yes Kc, Maren Beach, MD  liothyronine (CYTOMEL) 5 MCG tablet Take 1 tablet (5 mcg total) by mouth daily. 10/13/21 11/12/21 Yes Antonieta Pert, MD  midodrine (PROAMATINE) 10 MG tablet Take 1 tablet (10 mg total) by mouth 3 (three) times daily with meals. 10/12/21  Yes Antonieta Pert, MD  Multiple Vitamin (MULTIVITAMIN WITH MINERALS) TABS tablet Take 1 tablet by mouth daily. 10/13/21  Yes Antonieta Pert, MD  torsemide 40 MG TABS Take 40 mg by mouth daily. 10/16/21  Yes Antonieta Pert, MD  acetaminophen (TYLENOL) 500 MG tablet Take 1,000 mg by mouth every 6 (six) hours as needed for mild pain.    [provider]  albuterol (VENTOLIN HFA) 108 (90 Base) MCG/ACT inhaler Inhale 1-2 puffs into the lungs every 6 (six) hours as needed for wheezing or shortness of breath. 01/16/21 01/16/22  [provider]  amiodarone (PACERONE) 200 MG tablet Take 200 mg by mouth daily. Patient not taking: Reported on 10/26/2021 10/18/21   [provider]  losartan (COZAAR) 25 MG tablet Take 25 mg by mouth daily. Patient not taking: Reported on 10/26/2021 10/18/21   [provider]  Nutritional Supplements (,FEEDING SUPPLEMENT, PROSOURCE PLUS) liquid Take 30 mLs by mouth 2 (two) times daily between meals. 10/12/21   Antonieta Pert, MD  pravastatin (PRAVACHOL) 20 MG tablet Take 20 mg by mouth every evening.    [provider]    Inpatient Medications:   (feeding supplement) PROSource Plus  30 mL Oral BID BM   aspirin EC  81 mg Oral BID   calcitRIOL  0.25  mcg Oral Daily   guaiFENesin  600 mg Oral BID   heparin injection (subcutaneous)  5,000 Units Subcutaneous Q8H   levothyroxine  75 mcg Oral Q0600   liothyronine  5 mcg Oral Daily   [START ON 11/03/2021] metolazone  2.5 mg Oral Once per day on Mon Fri   midodrine  10 mg Oral TID WC   multivitamin with minerals  1 tablet Oral Daily   pravastatin  20 mg Oral QPM   torsemide  40 mg Oral Daily     Allergies:  Allergies  Allergen Reactions   Morphine Anaphylaxis   Oxycodone Other (See Comments)    Hypotension and bradycardia   Carvedilol     Dizziness and syncope    Cefuroxime Axetil Swelling   Iodinated Contrast Media Rash    Other reaction(s): Asthenia (finding), Other (qualifier value) paralyzed legs Other reaction(s): RASH    Lisinopril Cough and Other (See Comments)    Social History   Socioeconomic History   Marital status: Widowed  Spouse name: Not on file   Number of children: Not on file   Years of education: Not on file   Highest education level: Not on file  Occupational History   Not on file  Tobacco Use   Smoking status: Former   Smokeless tobacco: Never  Vaping Use   Vaping Use: Never used  Substance and Sexual Activity   Alcohol use: No   Drug use: No   Sexual activity: Not on file  Other Topics Concern   Not on file  Social History Narrative   Not on file   Social Determinants of Health   Financial Resource Strain: Not on file  Food Insecurity: Not on file  Transportation Needs: Not on file  Physical Activity: Not on file  Stress: Not on file  Social Connections: Not on file  Intimate Partner Violence: Not on file     Family History  Problem Relation Age of Onset   Hypertension Mother    Heart failure Mother    Heart failure Father    Bladder Cancer Neg Hx    Prostate cancer Neg Hx    Kidney cancer Neg Hx      Review of Systems Positive for shortness of breath Negative for: General:  chills, fever, night sweats or weight  changes.  Cardiovascular: PND orthopnea syncope dizziness  Dermatological skin lesions rashes Respiratory: Cough congestion Urologic: Frequent urination urination at night and hematuria Abdominal: negative for nausea, vomiting, diarrhea, bright red blood per rectum, melena, or hematemesis Neurologic: negative for visual changes, and/or hearing changes  All other systems reviewed and are otherwise negative except as noted above.  Labs: No results for input(s): CKTOTAL, CKMB, TROPONINI in the last 72 hours. Lab Results  Component Value Date   WBC 4.0 11/01/2021   HGB 8.9 (L) 11/01/2021   HCT 27.7 (L) 11/01/2021   MCV 108.2 (H) 11/01/2021   PLT 143 (L) 11/01/2021    Recent Labs  Lab 10/28/21 0500 10/29/21 0435 11/01/21 0407  NA 134*   < > 129*  K 4.9   < > 5.3*  CL 99   < > 95*  CO2 20*   < > 24  BUN 147*   < > 143*  CREATININE 4.41*   < > 4.06*  CALCIUM 8.7*   < > 8.7*  PROT 6.7  --   --   BILITOT 0.9  --   --   ALKPHOS 118  --   --   ALT 42  --   --   AST 27  --   --   GLUCOSE 95   < > 88   < > = values in this interval not displayed.   No results found for: CHOL, HDL, LDLCALC, TRIG Lab Results  Component Value Date   DDIMER 1.58 (H) 07/26/2021    Radiology/Studies:  DG Chest Port 1 View  Result Date: 10/25/2021 CLINICAL DATA:  Shortness of breath EXAM: PORTABLE CHEST 1 VIEW COMPARISON:  10/07/2021, CT 05/19/2021, radiograph 09/30/2021, 07/28/2021, 02/07/2021 FINDINGS: Post sternotomy changes. Valve prosthesis. Cardiomegaly with vascular congestion and mild interstitial edema. Small loculated right pleural effusion. New ground-glass peripheral opacity in the left mid lung. Aortic atherosclerosis. No pneumothorax. Slight increased airspace disease at left base. IMPRESSION: 1. New peripheral airspace disease in the left mid lung with increasing airspace disease at left base suspicious for acute infectious or inflammatory process 2. Cardiomegaly with vascular congestion and  probable low-grade edema. There are bilateral effusions which may be slightly  loculated on the right Electronically Signed   By: Donavan Foil M.D.   On: 10/25/2021 22:53   DG Chest Port 1 View  Result Date: 10/07/2021 CLINICAL DATA:  Unresponsive EXAM: PORTABLE CHEST 1 VIEW COMPARISON:  09/30/2021, CT 05/19/2001, 05/31/2021, chest x-ray 09/09/2020 FINDINGS: Post sternotomy changes and valve prosthesis. Cardiomegaly with vascular congestion and small right greater than left pleural effusions. Probable underlying interstitial edema. Patchy airspace opacities at the bases without significant change IMPRESSION: 1. Overall no great change in radiographic appearance of the chest since 09/30/2021. 2. Cardiomegaly with vascular congestion, small bilateral effusions and probable pulmonary edema. 3. Patchy airspace disease at the bases which may be due to atelectasis or pneumonia Electronically Signed   By: Donavan Foil M.D.   On: 10/07/2021 15:46   ECHOCARDIOGRAM COMPLETE  Result Date: 10/03/2021    ECHOCARDIOGRAM REPORT   Patient Name:   [name] Date of Exam: 10/03/2021 Medical Rec #:  [id number]       Height:       72.0 in Accession #:    [id number]      Weight:       166.0 lb Date of Birth:  07-13-1931       BSA:          1.968 m Patient Age:    10 years        BP:           103/69 mmHg Patient Gender: M               HR:           61 bpm. Exam Location:  [facility] Procedure: 2D Echo, Cardiac Doppler and Color Doppler Indications:     CHF-acute systolic J88.41  History:         Patient has prior history of Echocardiogram examinations, most                  recent 01/31/2021. CHF; Risk Factors:Hypertension. CKD.                  Aortic Valve: bioprosthetic valve is present in the aortic                  position.  Sonographer:     Sherrie Sport Referring Phys:  [id number] Bradly Bienenstock Diagnosing Phys: Serafina Royals MD  Sonographer Comments: No subcostal window. IMPRESSIONS  1. Left ventricular ejection fraction,  by estimation, is <20%. The left ventricle has severely decreased function. The left ventricle demonstrates global hypokinesis. The left ventricular internal cavity size was severely dilated. Left ventricular diastolic parameters were normal.  2. Right ventricular systolic function is mildly reduced. The right ventricular size is moderately enlarged.  3. Left atrial size was moderately dilated.  4. Right atrial size was moderately dilated.  5. The mitral valve is myxomatous. Moderate to severe mitral valve regurgitation.  6. Tricuspid valve regurgitation is moderate to severe.  7. The aortic valve has been repaired/replaced. Aortic valve regurgitation is trivial. There is a bioprosthetic valve present in the aortic position. Echo findings are consistent with normal structure and function of the aortic valve prosthesis. FINDINGS  Left Ventricle: Left ventricular ejection fraction, by estimation, is <20%. The left ventricle has severely decreased function. The left ventricle demonstrates global hypokinesis. The left ventricular internal cavity size was severely dilated. There is no left ventricular hypertrophy. Left ventricular diastolic parameters were normal. Right Ventricle: The right ventricular size is moderately enlarged. No increase in  right ventricular wall thickness. Right ventricular systolic function is mildly reduced. Left Atrium: Left atrial size was moderately dilated. Right Atrium: Right atrial size was moderately dilated. Pericardium: There is no evidence of pericardial effusion. Mitral Valve: The mitral valve is myxomatous. Moderate to severe mitral valve regurgitation. MV peak gradient, 2.1 mmHg. The mean mitral valve gradient is 1.0 mmHg. Tricuspid Valve: The tricuspid valve is normal in structure. Tricuspid valve regurgitation is moderate to severe. Aortic Valve: The aortic valve has been repaired/replaced. Aortic valve regurgitation is trivial. Aortic valve mean gradient measures 3.3 mmHg. Aortic  valve peak gradient measures 6.1 mmHg. Aortic valve area, by VTI measures 1.02 cm. There is a bioprosthetic valve present in the aortic position. Echo findings are consistent with normal structure and function of the aortic valve prosthesis. Pulmonic Valve: The pulmonic valve was normal in structure. Pulmonic valve regurgitation is mild. Aorta: The aortic root and ascending aorta are structurally normal, with no evidence of dilitation. IAS/Shunts: No atrial level shunt detected by color flow Doppler.  LEFT VENTRICLE PLAX 2D LVIDd:         6.00 cm      Diastology LVIDs:         5.60 cm      LV e' medial:    3.48 cm/s LV PW:         0.80 cm      LV E/e' medial:  19.6 LV IVS:        1.10 cm      LV e' lateral:   3.70 cm/s LVOT diam:     2.10 cm      LV E/e' lateral: 18.4 LV SV:         20 LV SV Index:   10 LVOT Area:     3.46 cm  LV Volumes (MOD) LV vol d, MOD A2C: 245.0 ml LV vol d, MOD A4C: 173.0 ml LV vol s, MOD A2C: 179.0 ml LV vol s, MOD A4C: 148.0 ml LV SV MOD A2C:     66.0 ml LV SV MOD A4C:     173.0 ml LV SV MOD BP:      43.0 ml RIGHT VENTRICLE RV Basal diam:  5.20 cm RV S prime:     5.55 cm/s TAPSE (M-mode): 1.1 cm LEFT ATRIUM              Index        RIGHT ATRIUM           Index LA diam:        4.50 cm  2.29 cm/m   RA Area:     30.40 cm LA Vol (A2C):   121.0 ml 61.47 ml/m  RA Volume:   93.60 ml  47.55 ml/m LA Vol (A4C):   78.7 ml  39.98 ml/m LA Biplane Vol: 99.7 ml  50.65 ml/m  AORTIC VALVE                    PULMONIC VALVE AV Area (Vmax):    0.84 cm     PV Vmax:          0.50 m/s AV Area (Vmean):   0.87 cm     PV Vmean:         31.600 cm/s AV Area (VTI):     1.02 cm     PV VTI:           0.092 m AV Vmax:  123.67 cm/s  PV Peak grad:     1.0 mmHg AV Vmean:          82.900 cm/s  PV Mean grad:     0.0 mmHg AV VTI:            0.199 m      PR End Diast Vel: 11.72 msec AV Peak Grad:      6.1 mmHg     RVOT Peak grad:   2 mmHg AV Mean Grad:      3.3 mmHg LVOT Vmax:         30.00 cm/s LVOT Vmean:         20.800 cm/s LVOT VTI:          0.059 m LVOT/AV VTI ratio: 0.29  AORTA Ao Root diam: 4.33 cm MITRAL VALVE               TRICUSPID VALVE MV Area (PHT): 3.61 cm    TR Peak grad:   53.6 mmHg MV Area VTI:   1.23 cm    TR Vmax:        366.00 cm/s MV Peak grad:  2.1 mmHg MV Mean grad:  1.0 mmHg    SHUNTS MV Vmax:       0.72 m/s    Systemic VTI:  0.06 m MV Vmean:      51.9 cm/s   Systemic Diam: 2.10 cm MV Decel Time: 210 msec    Pulmonic VTI:  0.162 m MV E velocity: 68.10 cm/s MV A velocity: 55.70 cm/s MV E/A ratio:  1.22 Serafina Royals MD Electronically signed by Serafina Royals MD Signature Date/Time: 10/03/2021/1:49:11 PM    Final        Weights: Filed Weights   10/29/21 0500 10/30/21 0500 11/01/21 0545  Weight: 75.8 kg 76.1 kg 70.9 kg     Physical Exam: Blood pressure 95/70, pulse 60, temperature 97.8 F (36.6 C), resp. rate 20, height 6' (1.829 m), weight 70.9 kg, SpO2 100 %. Body mass index is 21.2 kg/m. General: Well developed, well nourished, in no acute distress. Head eyes ears nose throat: Normocephalic, atraumatic, sclera non-icteric, no xanthomas, nares are without discharge. No apparent thyromegaly and/or mass  Lungs: Normal respiratory effort.  no wheezes, few basilar rales, no rhonchi.  Heart: RRR with normal S1 S2.  2-3+ aortic murmur gallop, no rub, PMI is normal size and placement, carotid upstroke normal with bruit, jugular venous pressure is normal Abdomen: Soft, non-tender, non-distended with normoactive bowel sounds. No hepatomegaly. No rebound/guarding. No obvious abdominal masses. Abdominal aorta is normal size without bruit Extremities: Trace   edema. no cyanosis, no clubbing, no ulcers  Peripheral : 2+ bilateral upper extremity pulses, 2+ bilateral femoral pulses, 2+ bilateral dorsal pedal pulse Neuro: Alert and oriented. No facial asymmetry. No focal deficit. Moves all extremities spontaneously. Musculoskeletal: Normal muscle tone without kyphosis Psych:  Responds to  questions appropriately with a normal affect.    Assessment: 88 year old male with coronary artery disease status post chronic bypass graft hypertension hyperlipidemia aortic valve replacement severe chronic kidney disease stage V with glomerular filtration rate 12 having exacerbations multiple times with admissions multifactorial in nature but currently relatively stable current medical regimen without evidence of acute coronary syndrome  Plan: 1.  No change in current regimen of diuretics for further risk worsening congestive heart failure and or chronic kidney disease exacerbation 2.  No additional medication management for congestive heart failure due to hypotension 3.  No further cardiac diagnostics necessary  at this time 4.  Further evaluation by home health, social work, or other healthcare consultants to determine best avenue management as an outpatient to maintain current condition without exacerbation  Signed, Corey Skains M.D. [facility] 11/01/2021, 4:47 PM

## 2021-11-02 DIAGNOSIS — I5023 Acute on chronic systolic (congestive) heart failure: Secondary | ICD-10-CM | POA: Diagnosis not present

## 2021-11-02 DIAGNOSIS — J810 Acute pulmonary edema: Secondary | ICD-10-CM | POA: Diagnosis not present

## 2021-11-02 DIAGNOSIS — N390 Urinary tract infection, site not specified: Secondary | ICD-10-CM | POA: Diagnosis not present

## 2021-11-02 DIAGNOSIS — I480 Paroxysmal atrial fibrillation: Secondary | ICD-10-CM | POA: Diagnosis not present

## 2021-11-02 LAB — BASIC METABOLIC PANEL
Anion gap: 12 (ref 5–15)
BUN: 145 mg/dL — ABNORMAL HIGH (ref 8–23)
CO2: 25 mmol/L (ref 22–32)
Calcium: 8.8 mg/dL — ABNORMAL LOW (ref 8.9–10.3)
Chloride: 94 mmol/L — ABNORMAL LOW (ref 98–111)
Creatinine, Ser: 3.87 mg/dL — ABNORMAL HIGH (ref 0.61–1.24)
GFR, Estimated: 14 mL/min — ABNORMAL LOW (ref >60)
Glucose, Bld: 88 mg/dL (ref 70–99)
Potassium: 5 mmol/L (ref 3.5–5.1)
Sodium: 131 mmol/L — ABNORMAL LOW (ref 135–145)

## 2021-11-02 NOTE — Progress Notes (Signed)
Progress Note   Patient: [name] LFY:[id number] DOB: Jun 19, 1931 DOA: 10/25/2021     8 DOS: the patient was seen and examined on 11/02/2021    Brief hospital course: [name] is an 28 yr. M with CKD V, baseline 3.9, sCHF EF <20% due to ischemic CM, AS and CAD s/p CABG and AVR 2011, pAF not on AC, PVD and recent life-threatening myxedema coma who presented from SNF with abnormal labs.   Prior to admission, patient had recently been discharged from hospital to SNF after myxedema crisis, had been at [facility] for about 10 days prior to admission, felt like he was "getting better".  Able to get out of bed, more alert.   In the last few days prior to admission he was told that he had "dehydration", so was given IV fluids at the facility.  Then sent to the ER for "dehydration".   In the ER, creatinine up to 4.4, up from 3.5 at discharge.  He had peripheral edema, BNP greater than 4500, and chest x-ray with worsened bilateral airspace opacities.  Started on Lasix and admitted.  2/16- stable, improved. Continues to be medically stable and is awaiting safe disposition plan  Assessment and Plan:  Acute on chronic systolic CHF ([place])- (present on admission) appears euvolemic. - torsemide 40 daily - metolazone 2.5 twice weekly   - Consulted Nephrology, appreciate expertise  Mild hyponatremia   hyperkalemia- resolved - BMP am  Hypothyroidism Recently nearly died from Myxedema coma.  Clearly improving from the standpoint of thyroid failure, but overall prognosis is still dire given the underlying pre-existing chronic renal and cardiac failure.  TSH improving as expected. -Continue levothyroxine, liothyronine - If consistent with goals of care, would recommend Endocrinology follow up after discharge  Schistocytes on peripheral blood smear-  CBC has remained normal.   MAHA ruled out.  No further work up necessary.  Anemia in other chronic diseases classified elsewhere- (present on admission) Hgb  stable, no clinical bleeding  AF (paroxysmal atrial fibrillation) ([place])- (present on admission) Not on anticoagulation due to pre-existing factors. -Continue low dose aspirin  Pressure injury of buttock, stage 2 ([place])- (present on admission) - Local wound care  CKD (chronic kidney disease), stage V ([place])- (present on admission) Creatinine gradually improved with diuresis.  Likely his worsening renal function at his facility was congestion not 'dehydration'.  Patient continues to decline dialysis which will limit life expectancy. Almost 2L UOP yesterday. Cr 3.87 - nephrology following, appreciate recs - palliative consulted, appreciate recs  H/o Essential HTN- hypotensive since admission - Hold losartan - Continue midodrine - Continue torsemide  Goals of care- disposition is undecided from family standpoint. Insurance denied SNF. Family and patient have been presented with options for discharge. Daughter is appealing decision.   Subjective: patient reports he feels better today. He has no complaints. Awaiting disposition.   Physical Exam: Vitals:   11/01/21 2050 11/02/21 0030 11/02/21 0447 11/02/21 0500  BP: 99/64 96/66 100/71   Pulse: 60 (!) 58 62   Resp: 18 16 20    Temp:  (!) 97.5 F (36.4 C) (!) 97.5 F (36.4 C)   TempSrc:   Oral   SpO2: 98% 100% 100%   Weight:    68.3 kg  Height:       General: NAD, able to participate in exam Cardiac: RRR, normal heart sounds, no murmurs. Respiratory:normal effort Extremities: bilateral LE edema. WWP. Skin: warm and dry, no rashes noted on exposed skin Neuro: alert and oriented, no  focal deficits Psych: flat affect and mood  Data Reviewed: Cr 4.11>4.06>3.87 GFR 13>14 Na+ 131 K+ 5.0  Family Communication: none  Disposition: Status is: Inpatient Remains inpatient appropriate because: The patient is medically cleared for discharge to skilled nursing facility as soon as a bed is available   Planned Discharge Destination:  Skilled nursing facility  VTE Ppx: heparin injection 5,000 Units Start: 10/26/21 0830   Author: Richarda Osmond, MD 11/02/2021 7:31 AM  For on call review [email or web address].

## 2021-11-02 NOTE — TOC Progression Note (Signed)
Transition of Care [facility]) - Progression Note    Patient Details  Name: VLADIMIR LENHOFF MRN: [id number] Date of Birth: 1931/06/18  Transition of Care [facility]) CM/SW [place], LCSW Phone Number: 11/02/2021, 12:56 PM  Clinical Narrative: Case to be presented at difficult to place meeting today via [place], staff updated in progression rounds.      Expected Discharge Plan: [place] Barriers to Discharge: Continued Medical Work up  Expected Discharge Plan and Services Expected Discharge Plan: [place] arrangements for the past 2 months: [place]                                       Social Determinants of Health (SDOH) Interventions    Readmission Risk Interventions Readmission Risk Prevention Plan 05/31/2021  Transportation Screening Complete  PCP or Specialist Appt within 3-5 Days Complete  Social Work Consult for [place] Planning/Counseling Complete  Palliative Care Screening Not Applicable  Medication Review Press photographer) Complete  Some recent data might be hidden

## 2021-11-02 NOTE — Progress Notes (Signed)
[facility]  ROUNDING NOTE   Subjective:   [name] is a 88 year old with past medical history of hypertension, dyslipidemia, aortic aneurysm, and stage IV CKD.  Patient presents from rehab facility with fluid overloa.  With suspected to be dehydrated and prescribed IV fluids.  Patient states no shortness of breath but bilateral lower extremity and abdominal edema.  Patient has been admitted for Acute pulmonary edema (HCC) [J81.0] Acute CHF (congestive heart failure) ([place]) [I50.9] Urinary tract infection without hematuria, site unspecified [N39.0]  Patient is known to our practice and receives outpatient care from Dr. Holley Raring.    Patient seen sitting up in bed, currently eating breakfast Alert and more talkative today Tolerating meals without nausea and vomiting Denies shortness of breath, patient remains on 2 L nasal cannula  Creatinine remains stable Urine output recorded of 1.9 L in 24 hours  Objective:  Vital signs in last 24 hours:  Temp:  [97.5 F (36.4 C)-98.4 F (36.9 C)] 98.4 F (36.9 C) (02/16 0757) Pulse Rate:  [58-64] 64 (02/16 0837) Resp:  [16-20] 18 (02/16 0757) BP: (84-100)/(63-71) 93/63 (02/16 0837) SpO2:  [98 %-100 %] 100 % (02/16 0757) Weight:  [68.3 kg] 68.3 kg (02/16 0500)  Weight change: -2.6 kg Filed Weights   10/30/21 0500 11/01/21 0545 11/02/21 0500  Weight: 76.1 kg 70.9 kg 68.3 kg    Intake/Output: I/O last 3 completed shifts: In: 600 [P.O.:600] Out: 1950 [Urine:1950]   Intake/Output this shift:  Total I/O In: 360 [P.O.:360] Out: -   Physical Exam: General: NAD  Head: Normocephalic, atraumatic. Moist oral mucosal membranes  Eyes: Anicteric  Lungs:  Crackles, normal effort, O2   Heart: Regular rate and rhythm  Abdomen:  Soft, nontender  Extremities:  trace peripheral edema.  Neurologic: Nonfocal, moving all four extremities  Skin: No lesions       Basic Metabolic Panel: Recent Labs  Lab 10/29/21 0435  10/30/21 0509 10/31/21 0416 11/01/21 0407 11/02/21 0505  NA 129* 131* 131* 129* 131*  K 4.7 4.8 4.9 5.3* 5.0  CL 96* 95* 97* 95* 94*  CO2 20* 21* 24 24 25   GLUCOSE 103* 99 94 88 88  BUN 143* 99* 148* 143* 145*  CREATININE 4.17* 4.09* 4.11* 4.06* 3.87*  CALCIUM 8.5* 8.8* 8.8* 8.7* 8.8*     Liver Function Tests: Recent Labs  Lab 10/28/21 0500  AST 27  ALT 42  ALKPHOS 118  BILITOT 0.9  PROT 6.7  ALBUMIN 3.1*    No results for input(s): LIPASE, AMYLASE in the last 168 hours. No results for input(s): AMMONIA in the last 168 hours.  CBC: Recent Labs  Lab 10/28/21 0500 11/01/21 0407  WBC 5.2 4.0  HGB 8.7* 8.9*  HCT 27.1* 27.7*  MCV 108.4* 108.2*  PLT 165 143*     Cardiac Enzymes: No results for input(s): CKTOTAL, CKMB, CKMBINDEX, TROPONINI in the last 168 hours.  BNP: Invalid input(s): POCBNP  CBG: No results for input(s): GLUCAP in the last 168 hours.  Microbiology: Results for orders placed or performed during the hospital encounter of 10/25/21  Blood culture (routine x 2)     Status: None   Collection Time: 10/25/21 11:41 PM   Specimen: BLOOD  Result Value Ref Range Status   Specimen Description BLOOD LEFT ASSIST CONTROL  Final   Special Requests   Final    BOTTLES DRAWN AEROBIC AND ANAEROBIC Blood Culture adequate volume   Culture   Final    NO GROWTH  5 DAYS Performed at [facility], [address] [place], [place] [address]    Report Status 10/31/2021 FINAL  Final  Resp Panel by RT-PCR (Flu A&B, Covid) Nasopharyngeal Swab     Status: None   Collection Time: 10/25/21 11:41 PM   Specimen: Nasopharyngeal Swab; Nasopharyngeal(NP) swabs in vial transport medium  Result Value Ref Range Status   SARS Coronavirus 2 by RT PCR NEGATIVE NEGATIVE Final    Comment: (NOTE) SARS-CoV-2 target nucleic acids are NOT DETECTED.  The SARS-CoV-2 RNA is generally detectable in upper respiratory specimens during the acute phase of infection. The  lowest concentration of SARS-CoV-2 viral copies this assay can detect is 138 copies/mL. A negative result does not preclude SARS-Cov-2 infection and should not be used as the sole basis for treatment or other patient management decisions. A negative result may occur with  improper specimen collection/handling, submission of specimen other than nasopharyngeal swab, presence of viral mutation(s) within the areas targeted by this assay, and inadequate number of viral copies(<138 copies/mL). A negative result must be combined with clinical observations, patient history, and epidemiological information. The expected result is Negative.  Fact Sheet for Patients:  EntrepreneurPulse.com.au  Fact Sheet for Healthcare Providers:  IncredibleEmployment.be  This test is no t yet approved or cleared by the [place] FDA and  has been authorized for detection and/or diagnosis of SARS-CoV-2 by FDA under an Emergency Use Authorization (EUA). This EUA will remain  in effect (meaning this test can be used) for the duration of the COVID-19 declaration under Section 564(b)(1) of the Act, 21 U.S.C.section 360bbb-3(b)(1), unless the authorization is terminated  or revoked sooner.       Influenza A by PCR NEGATIVE NEGATIVE Final   Influenza B by PCR NEGATIVE NEGATIVE Final    Comment: (NOTE) The Xpert Xpress SARS-CoV-2/FLU/RSV plus assay is intended as an aid in the diagnosis of influenza from Nasopharyngeal swab specimens and should not be used as a sole basis for treatment. Nasal washings and aspirates are unacceptable for Xpert Xpress SARS-CoV-2/FLU/RSV testing.  Fact Sheet for Patients: EntrepreneurPulse.com.au  Fact Sheet for Healthcare Providers: IncredibleEmployment.be  This test is not yet approved or cleared by the [place] FDA and has been authorized for detection and/or diagnosis of SARS-CoV-2 by FDA under  an Emergency Use Authorization (EUA). This EUA will remain in effect (meaning this test can be used) for the duration of the COVID-19 declaration under Section 564(b)(1) of the Act, 21 U.S.C. section 360bbb-3(b)(1), unless the authorization is terminated or revoked.  Performed at [facility], [address] [place], [place] [address]   Blood culture (routine x 2)     Status: None   Collection Time: 10/25/21 11:43 PM   Specimen: BLOOD  Result Value Ref Range Status   Specimen Description BLOOD LEFT ASSIST CONTROL  Final   Special Requests   Final    BOTTLES DRAWN AEROBIC AND ANAEROBIC Blood Culture adequate volume   Culture   Final    NO GROWTH 5 DAYS Performed at [facility], [address] [place], [place] [address]    Report Status 10/31/2021 FINAL  Final  Resp Panel by RT-PCR (Flu A&B, Covid) Nasopharyngeal Swab     Status: None   Collection Time: 10/31/21  1:35 PM   Specimen: Nasopharyngeal Swab; Nasopharyngeal(NP) swabs in vial transport medium  Result Value Ref Range Status   SARS Coronavirus 2 by RT PCR NEGATIVE NEGATIVE Final    Comment: (NOTE) SARS-CoV-2 target nucleic  acids are NOT DETECTED.  The SARS-CoV-2 RNA is generally detectable in upper respiratory specimens during the acute phase of infection. The lowest concentration of SARS-CoV-2 viral copies this assay can detect is 138 copies/mL. A negative result does not preclude SARS-Cov-2 infection and should not be used as the sole basis for treatment or other patient management decisions. A negative result may occur with  improper specimen collection/handling, submission of specimen other than nasopharyngeal swab, presence of viral mutation(s) within the areas targeted by this assay, and inadequate number of viral copies(<138 copies/mL). A negative result must be combined with clinical observations, patient history, and epidemiological information. The expected result is Negative.  Fact  Sheet for Patients:  EntrepreneurPulse.com.au  Fact Sheet for Healthcare Providers:  IncredibleEmployment.be  This test is no t yet approved or cleared by the [place] FDA and  has been authorized for detection and/or diagnosis of SARS-CoV-2 by FDA under an Emergency Use Authorization (EUA). This EUA will remain  in effect (meaning this test can be used) for the duration of the COVID-19 declaration under Section 564(b)(1) of the Act, 21 U.S.C.section 360bbb-3(b)(1), unless the authorization is terminated  or revoked sooner.       Influenza A by PCR NEGATIVE NEGATIVE Final   Influenza B by PCR NEGATIVE NEGATIVE Final    Comment: (NOTE) The Xpert Xpress SARS-CoV-2/FLU/RSV plus assay is intended as an aid in the diagnosis of influenza from Nasopharyngeal swab specimens and should not be used as a sole basis for treatment. Nasal washings and aspirates are unacceptable for Xpert Xpress SARS-CoV-2/FLU/RSV testing.  Fact Sheet for Patients: EntrepreneurPulse.com.au  Fact Sheet for Healthcare Providers: IncredibleEmployment.be  This test is not yet approved or cleared by the [place] FDA and has been authorized for detection and/or diagnosis of SARS-CoV-2 by FDA under an Emergency Use Authorization (EUA). This EUA will remain in effect (meaning this test can be used) for the duration of the COVID-19 declaration under Section 564(b)(1) of the Act, 21 U.S.C. section 360bbb-3(b)(1), unless the authorization is terminated or revoked.  Performed at [facility], [address] [place], [place] [address]     Coagulation Studies: No results for input(s): LABPROT, INR in the last 72 hours.  Urinalysis: No results for input(s): COLORURINE, LABSPEC, PHURINE, GLUCOSEU, HGBUR, BILIRUBINUR, KETONESUR, PROTEINUR, UROBILINOGEN, NITRITE, LEUKOCYTESUR in the last 72 hours.  Invalid input(s):  APPERANCEUR     Imaging: No results found.   Medications:      (feeding supplement) PROSource Plus  30 mL Oral BID BM   aspirin EC  81 mg Oral BID   calcitRIOL  0.25 mcg Oral Daily   guaiFENesin  600 mg Oral BID   heparin injection (subcutaneous)  5,000 Units Subcutaneous Q8H   levothyroxine  75 mcg Oral Q0600   liothyronine  5 mcg Oral Daily   [START ON 11/03/2021] metolazone  2.5 mg Oral Once per day on Mon Fri   midodrine  10 mg Oral TID WC   multivitamin with minerals  1 tablet Oral Daily   pravastatin  20 mg Oral QPM   torsemide  40 mg Oral Daily   acetaminophen **OR** acetaminophen, albuterol, hydrocerin, ondansetron **OR** ondansetron (ZOFRAN) IV, polyethylene glycol, traZODone  Assessment/ Plan:  [name] is a 88 y.o.  male with past medical history of hypertension, dyslipidemia, aortic aneurysm, and stage IV CKD.  Patient presents from rehab facility with fluid overload.  With suspected to be dehydrated and prescribed IV fluids.  Patient states  no shortness of breath but bilateral lower extremity and abdominal edema.  Patient has been admitted for Acute pulmonary edema (HCC) [J81.0] Acute CHF (congestive heart failure) (HCC) [I50.9] Urinary tract infection without hematuria, site unspecified [N39.0]   Acute Kidney Injury with hyponatremia on chronic kidney disease stage IV with baseline creatinine 3.54 and GFR of 16 on 10/14/21.  Acute kidney injury secondary to cardiorenal syndrome Chronic kidney disease secondary to hypertension and vascular disease. No IV contrast exposure. No acute indication for dialysis at this time No longer interested in dialysis -Decreased to torsemide 40 mg daily, continues to have adequate urine output - Sodium improved to 131 -Discharge planning currently underway   Lab Results  Component Value Date   CREATININE 3.87 (H) 11/02/2021   CREATININE 4.06 (H) 11/01/2021   CREATININE 4.11 (H) 10/31/2021    Intake/Output  Summary (Last 24 hours) at 11/02/2021 1125 Last data filed at 11/02/2021 1018 Gross per 24 hour  Intake 600 ml  Output 1250 ml  Net -650 ml    2.  Chronic systolic heart failure.  Echo from 10/03/2021 shows EF less than 20% with left ventricular hypokinesis.  Cardiology following    LOS: 8 [place] 2/16/202311:25 AM

## 2021-11-03 DIAGNOSIS — N390 Urinary tract infection, site not specified: Secondary | ICD-10-CM | POA: Diagnosis not present

## 2021-11-03 DIAGNOSIS — I5023 Acute on chronic systolic (congestive) heart failure: Secondary | ICD-10-CM | POA: Diagnosis not present

## 2021-11-03 DIAGNOSIS — J810 Acute pulmonary edema: Secondary | ICD-10-CM | POA: Diagnosis not present

## 2021-11-03 DIAGNOSIS — R718 Other abnormality of red blood cells: Secondary | ICD-10-CM

## 2021-11-03 DIAGNOSIS — I480 Paroxysmal atrial fibrillation: Secondary | ICD-10-CM | POA: Diagnosis not present

## 2021-11-03 LAB — BASIC METABOLIC PANEL
Anion gap: 11 (ref 5–15)
BUN: 138 mg/dL — ABNORMAL HIGH (ref 8–23)
CO2: 24 mmol/L (ref 22–32)
Calcium: 8.7 mg/dL — ABNORMAL LOW (ref 8.9–10.3)
Chloride: 95 mmol/L — ABNORMAL LOW (ref 98–111)
Creatinine, Ser: 3.85 mg/dL — ABNORMAL HIGH (ref 0.61–1.24)
GFR, Estimated: 14 mL/min — ABNORMAL LOW (ref >60)
Glucose, Bld: 92 mg/dL (ref 70–99)
Potassium: 4.6 mmol/L (ref 3.5–5.1)
Sodium: 130 mmol/L — ABNORMAL LOW (ref 135–145)

## 2021-11-03 NOTE — Evaluation (Signed)
Occupational Therapy Evaluation Patient Details Name: [name] MRN: [id number] DOB: 04-22-1931 Today's Date: 11/03/2021   History of Present Illness Pt is an 88 y.o male with significant PMH of  CKD stage IV, hypertension, proteinuria, hyperparathyroidism, HLD, aspiration pneumonia, EF 15-20%, ischemic cardiomyopathy, bilateral carotid stenosis, severe aortic stenosis, AAA, MGUS, paroxysmal SVT, CABG, PDA, status post bioprosthetic valve replacement in 2011 who presented to the ED with unresponsiveness. MD assessment includes: Myxedema coma, cadiogenic shock, acute on chronic CHF, AKI, thrombocytopenia, anemia, A-fib, acute respiratory failure, pressure injury of the buttock stage 2, and hyponatremia.   Clinical Impression   Upon entering the room, pt having just returned to bed with nursing staff and declined OOB activities but is agreeable to OT evaluation. Pt reports living with family and utilizing rollator at baseline. He endorses being independent in self care tasks and needing assist with transportation only. OT discussed recommendation of team currently being SNF and pt reports not wanting to participate in that. When asked what his goal was and what I could assist him with he replied, "comfort". OT assisting pt with repositioning in bed with mod A for rolling and placing pt onto R side. Pt washes his face with set up A. When asking pt about returning to see him later he replies, " I hope not" and when asked what he meant he reports "I'm going to heaven". Pt declines OT intervention at this time. OT notified team and hospice to see pt per his request. OT to SIGN OFF at this time.      Recommendations for follow up therapy are one component of a multi-disciplinary discharge planning process, led by the attending physician.  Recommendations may be updated based on patient status, additional functional criteria and insurance authorization.   Follow Up Recommendations  Other (comment) (Pt  is requesting hospice)    Assistance Recommended at Discharge Frequent or constant Supervision/Assistance  Patient can return home with the following A lot of help with walking and/or transfers;A lot of help with bathing/dressing/bathroom;Assistance with cooking/housework;Assistance with feeding    Functional Status Assessment  Patient has had a recent decline in their functional status and demonstrates the ability to make significant improvements in function in a reasonable and predictable amount of time.  Equipment Recommendations  Other (comment) (defer to next venue of care)       Precautions / Restrictions Precautions Precautions: Fall      Mobility Bed Mobility Overal bed mobility: Needs Assistance Bed Mobility: Rolling Rolling: Mod assist              Transfers                   General transfer comment: deferred per pt request after having just returned to bed          ADL either performed or assessed with clinical judgement   ADL Overall ADL's : Needs assistance/impaired     Grooming: Wash/dry hands;Wash/dry face;Supervision/safety;Bed level;Set up                                       Vision Patient Visual Report: No change from baseline              Pertinent Vitals/Pain Pain Assessment Pain Assessment: Faces Faces Pain Scale: Hurts even more Pain Location: generalized discomfort Pain Descriptors / Indicators: Discomfort Pain Intervention(s): Repositioned     Hand Dominance Right  Extremity/Trunk Assessment Upper Extremity Assessment Upper Extremity Assessment: Generalized weakness   Lower Extremity Assessment Lower Extremity Assessment: Generalized weakness       Communication Communication Communication: HOH   Cognition Arousal/Alertness: Awake/alert Behavior During Therapy: WFL for tasks assessed/performed Overall Cognitive Status: Within Functional Limits for tasks assessed                                  General Comments: Pt is oriented to self, location, and situation.                Home Living Family/patient expects to be discharged to:: Private residence Living Arrangements: Children Available Help at Discharge: Family;Available PRN/intermittently Type of Home: House Home Access: Level entry     Home Layout: One level     Bathroom Shower/Tub: Occupational psychologist: Standard Bathroom Accessibility: Yes How Accessible: Accessible via walker Home Equipment: [place] (2 wheels);Rollator (4 wheels);Shower seat;Grab bars - tub/shower   Additional Comments: Lift Chair      Prior Functioning/Environment Prior Level of Function : Independent/Modified Independent             Mobility Comments: Mod Ind amb with a rollator limited community distances, no fall history ADLs Comments: Ind with ADLs, family assists with transportation only                 OT Goals(Current goals can be found in the care plan section) Acute Rehab OT Goals Patient Stated Goal: to go to heaven OT Goal Formulation: With patient Time For Goal Achievement: 11/03/21 Potential to Achieve Goals: Good  OT Frequency:         AM-PAC OT "6 Clicks" Daily Activity     Outcome Measure Help from another person eating meals?: A Little Help from another person taking care of personal grooming?: A Little Help from another person toileting, which includes using toliet, bedpan, or urinal?: A Lot Help from another person bathing (including washing, rinsing, drying)?: A Lot Help from another person to put on and taking off regular upper body clothing?: A Little Help from another person to put on and taking off regular lower body clothing?: A Lot 6 Click Score: 15   End of Session Equipment Utilized During Treatment: Oxygen Nurse Communication: Mobility status  Activity Tolerance: Patient limited by fatigue Patient left: in bed;with call bell/phone within reach;with  bed alarm set;with nursing/sitter in room                   Time: 1100-1121 OT Time Calculation (min): 21 min Charges:  OT General Charges $OT Visit: 1 Visit OT Evaluation $OT Eval Moderate Complexity: 1 Mod OT Treatments $Therapeutic Activity: 8-22 mins  Darleen Crocker, MS, OTR/L , CBIS ascom [phone]  11/03/21, 12:02 PM

## 2021-11-03 NOTE — Care Management Important Message (Signed)
Important Message  Patient Details  Name: [name] MRN: [id number] Date of Birth: [birth year]   Medicare Important Message Given:  Yes     Dannette Barbara 11/03/2021, 11:17 AM

## 2021-11-03 NOTE — Progress Notes (Signed)
[facility])   MSW met with patient at bedside to provide education ofeducation related to hospice philosophy, services, and team approach to care per patient request. [name] verbalized understanding of information given. Per discussion,patient would like to discuss his final plans with his son and will notify HLT tomorrow of his decision.   Please call with any questions/concerns.    Thank you for the opportunity to participate in this patient's care.   Daphene Calamity, MSW [facility] Liaison  [phone]

## 2021-11-03 NOTE — TOC Progression Note (Signed)
Transition of Care [facility]) - Progression Note    Patient Details  Name: [name] MRN: [id number] Date of Birth: [birth year]  [facility]) CM/SW Contact  Eileen Stanford, LCSW Phone Number: 11/03/2021, 9:11 AM  Clinical Narrative:   Barrier at this time is safe dc. Pt is unable to go to SNF because [facility] has denied auth, after peer to peer review was completed. Pt lives alone and would be unsafe to dc home even with services. Pt's family is not interested in hospice. TOC Supervisor aware of case and has presented at difficult to place meeting.     Expected Discharge Plan: [place] Barriers to Discharge: Continued Medical Work up  Expected Discharge Plan and Services Expected Discharge Plan: [place] arrangements for the past 2 months: [place]                                       Social Determinants of Health (SDOH) Interventions    Readmission Risk Interventions Readmission Risk Prevention Plan 05/31/2021  Transportation Screening Complete  PCP or Specialist Appt within 3-5 Days Complete  Social Work Consult for [place] Planning/Counseling Complete  Palliative Care Screening Not Applicable  Medication Review Press photographer) Complete  Some recent data might be hidden

## 2021-11-03 NOTE — Progress Notes (Signed)
Progress Note   Patient: [name] AXK:[id number] DOB: Mar 30, 1931 DOA: 10/25/2021     9 DOS: the patient was seen and examined on 11/03/2021    Brief hospital course: [name] is an 87 yr. M with CKD V, baseline 3.9, sCHF EF <20% due to ischemic CM, AS and CAD s/p CABG and AVR 2011, pAF not on AC, PVD and recent life-threatening myxedema coma who presented from SNF with abnormal labs.   Prior to admission, patient had recently been discharged from hospital to SNF after myxedema crisis, had been at [facility] for about 10 days prior to admission, felt like he was "getting better".  Able to get out of bed, more alert.   In the last few days prior to admission he was told that he had "dehydration", so was given IV fluids at the facility.  Then sent to the ER for "dehydration".   In the ER, creatinine up to 4.4, up from 3.5 at discharge.  He had peripheral edema, BNP greater than 4500, and chest x-ray with worsened bilateral airspace opacities.  Started on Lasix and admitted.  11/03/21- stable. Continues to be medically stable and is awaiting safe disposition plan  Assessment and Plan:  Acute on chronic systolic CHF ([place])- (present on admission) appears euvolemic. - torsemide 40 daily - metolazone 2.5 twice weekly   - Consulted Nephrology, appreciate expertise  Mild hyponatremia- stable   hyperkalemia- resolved - BMP am  Hypothyroidism Recently nearly died from Myxedema coma.  Clearly improving from the standpoint of thyroid failure, but overall prognosis is still dire given the underlying pre-existing chronic renal and cardiac failure.  TSH improving as expected. -Continue levothyroxine, liothyronine - If consistent with goals of care, would recommend Endocrinology follow up after discharge  Schistocytes on peripheral blood smear-  CBC has remained normal.   MAHA ruled out.  No further work up necessary.  Anemia in other chronic diseases classified elsewhere- (present on  admission) Hgb stable, no clinical bleeding  AF (paroxysmal atrial fibrillation) ([place])- (present on admission) Not on anticoagulation due to pre-existing factors. -Continue low dose aspirin  Pressure injury of buttock, stage 2 ([place])- (present on admission) - Local wound care  CKD (chronic kidney disease), stage V ([place])- (present on admission) Creatinine gradually improved with diuresis.  Likely his worsening renal function at his facility was congestion not 'dehydration'.  Patient continues to decline dialysis which will limit life expectancy. Almost 2L UOP yesterday. Cr 3.85 - nephrology following, appreciate recs - palliative consulted, appreciate recs  H/o Essential HTN- hypotensive since admission - Hold losartan - Continue midodrine - Continue torsemide  Goals of care- disposition is undecided from family standpoint. Insurance denied SNF. Family and patient have been presented with options for discharge. Daughter is appealing decision.   Subjective: patient reports no complaints today. He states he is doing good overall.  Physical Exam: Vitals:   11/02/21 2047 11/03/21 0025 11/03/21 0411 11/03/21 0500  BP: 98/70 91/60 95/64    Pulse: 62 66 64   Resp: 18 18 20    Temp: 97.8 F (36.6 C) (!) 97.5 F (36.4 C) (!) 97.4 F (36.3 C)   TempSrc: Oral Oral Oral   SpO2: 100% 94% 98%   Weight:    74.2 kg  Height:       General: NAD, able to participate in exam Cardiac: RRR, normal heart sounds, no murmurs. Respiratory:normal effort Extremities: bilateral LE edema. WWP. Skin: warm and dry, no rashes noted on exposed skin Neuro: alert and oriented,  no focal deficits Psych: flat affect and mood  Data Reviewed: Cr 4.11>4.06>3.87>3.85 GFR 13>14 Na+ 131>130 K+ 5.0>4.6  Family Communication: none  Disposition: Status is: Inpatient Remains inpatient appropriate because: The patient is medically cleared for discharge to skilled nursing facility as soon as a bed is available    Planned Discharge Destination: Skilled nursing facility  VTE Ppx: heparin injection 5,000 Units Start: 10/26/21 0830   Author: Richarda Osmond, MD 11/03/2021 7:27 AM  For on call review [email or web address].

## 2021-11-03 NOTE — TOC Progression Note (Addendum)
Transition of Care [facility]) - Progression Note    Patient Details  Name: [name] MRN: [id number] Date of Birth: Nov 27, 1930  Transition [facility]) CM/SW Contact  Eileen Stanford, LCSW Phone Number: 11/03/2021, 11:29 AM  Clinical Narrative:   CSW went in pt's room to check in. CSW asked "do you want me to have hospice representative come and talk you to discuss what that would look like" pt responded "yes." TOC Supervisor and MD updated. CSW will reach out to hospice to come by and discuss with pt. At no time during the conversation did pt mention discussing with family or contacting family.    Expected Discharge Plan: [place] Barriers to Discharge: Continued Medical Work up  Expected Discharge Plan and Services Expected Discharge Plan: [place] arrangements for the past 2 months: [place]                                       Social Determinants of Health (SDOH) Interventions    Readmission Risk Interventions Readmission Risk Prevention Plan 05/31/2021  Transportation Screening Complete  PCP or Specialist Appt within 3-5 Days Complete  Social Work Consult for [place] Planning/Counseling Complete  Palliative Care Screening Not Applicable  Medication Review Press photographer) Complete  Some recent data might be hidden

## 2021-11-03 NOTE — Progress Notes (Signed)
[facility]  ROUNDING NOTE   Subjective:   [name] is a 88 year old with past medical history of hypertension, dyslipidemia, aortic aneurysm, and stage IV CKD.  Patient presents from rehab facility with fluid overloa.  With suspected to be dehydrated and prescribed IV fluids.  Patient states no shortness of breath but bilateral lower extremity and abdominal edema.  Patient has been admitted for Acute pulmonary edema (HCC) [J81.0] Acute CHF (congestive heart failure) ([employer]) [I50.9] Urinary tract infection without hematuria, site unspecified [N39.0]  Patient is known to our practice and receives outpatient care from Dr. Holley Raring.    Patient sitting up in chair Alert and oriented Tolerating meals without nausea and vomiting Denies shortness of breath, supplemental oxygen remains in place  Creatinine table Urine output recorded of 1.8 L in 24 hours  Objective:  Vital signs in last 24 hours:  Temp:  [97.4 F (36.3 C)-97.9 F (36.6 C)] 97.6 F (36.4 C) (02/17 0755) Pulse Rate:  [62-66] 63 (02/17 0755) Resp:  [17-20] 19 (02/17 0755) BP: (91-105)/(60-70) 102/64 (02/17 0755) SpO2:  [94 %-100 %] 99 % (02/17 0755) Weight:  [74.2 kg] 74.2 kg (02/17 0500)  Weight change: 5.9 kg Filed Weights   11/01/21 0545 11/02/21 0500 11/03/21 0500  Weight: 70.9 kg 68.3 kg 74.2 kg    Intake/Output: I/O last 3 completed shifts: In: 600 [P.O.:600] Out: 2400 [Urine:2400]   Intake/Output this shift:  Total I/O In: 240 [P.O.:240] Out: 800 [Urine:800]  Physical Exam: General: NAD  Head: Normocephalic, atraumatic. Moist oral mucosal membranes  Eyes: Anicteric  Lungs:  Crackles, normal effort, O2   Heart: Regular rate and rhythm  Abdomen:  Soft, nontender  Extremities:  1+ peripheral edema.  Neurologic: Nonfocal, moving all four extremities  Skin: No lesions       Basic Metabolic Panel: Recent Labs  Lab 10/30/21 0509 10/31/21 0416 11/01/21 0407 11/02/21 0505  11/03/21 0359  NA 131* 131* 129* 131* 130*  K 4.8 4.9 5.3* 5.0 4.6  CL 95* 97* 95* 94* 95*  CO2 21* 24 24 25 24   GLUCOSE 99 94 88 88 92  BUN 99* 148* 143* 145* 138*  CREATININE 4.09* 4.11* 4.06* 3.87* 3.85*  CALCIUM 8.8* 8.8* 8.7* 8.8* 8.7*     Liver Function Tests: Recent Labs  Lab 10/28/21 0500  AST 27  ALT 42  ALKPHOS 118  BILITOT 0.9  PROT 6.7  ALBUMIN 3.1*    No results for input(s): LIPASE, AMYLASE in the last 168 hours. No results for input(s): AMMONIA in the last 168 hours.  CBC: Recent Labs  Lab 10/28/21 0500 11/01/21 0407  WBC 5.2 4.0  HGB 8.7* 8.9*  HCT 27.1* 27.7*  MCV 108.4* 108.2*  PLT 165 143*     Cardiac Enzymes: No results for input(s): CKTOTAL, CKMB, CKMBINDEX, TROPONINI in the last 168 hours.  BNP: Invalid input(s): POCBNP  CBG: No results for input(s): GLUCAP in the last 168 hours.  Microbiology: Results for orders placed or performed during the hospital encounter of 10/25/21  Blood culture (routine x 2)     Status: None   Collection Time: 10/25/21 11:41 PM   Specimen: BLOOD  Result Value Ref Range Status   Specimen Description BLOOD LEFT ASSIST CONTROL  Final   Special Requests   Final    BOTTLES DRAWN AEROBIC AND ANAEROBIC Blood Culture adequate volume   Culture   Final    NO GROWTH 5 DAYS Performed at [facility], [place]  [address] [place], [place] [address]    Report Status 10/31/2021 FINAL  Final  Resp Panel by RT-PCR (Flu A&B, Covid) Nasopharyngeal Swab     Status: None   Collection Time: 10/25/21 11:41 PM   Specimen: Nasopharyngeal Swab; Nasopharyngeal(NP) swabs in vial transport medium  Result Value Ref Range Status   SARS Coronavirus 2 by RT PCR NEGATIVE NEGATIVE Final    Comment: (NOTE) SARS-CoV-2 target nucleic acids are NOT DETECTED.  The SARS-CoV-2 RNA is generally detectable in upper respiratory specimens during the acute phase of infection. The lowest concentration of SARS-CoV-2 viral copies this  assay can detect is 138 copies/mL. A negative result does not preclude SARS-Cov-2 infection and should not be used as the sole basis for treatment or other patient management decisions. A negative result may occur with  improper specimen collection/handling, submission of specimen other than nasopharyngeal swab, presence of viral mutation(s) within the areas targeted by this assay, and inadequate number of viral copies(<138 copies/mL). A negative result must be combined with clinical observations, patient history, and epidemiological information. The expected result is Negative.  Fact Sheet for Patients:  EntrepreneurPulse.com.au  Fact Sheet for Healthcare Providers:  IncredibleEmployment.be  This test is no t yet approved or cleared by the [place] FDA and  has been authorized for detection and/or diagnosis of SARS-CoV-2 by FDA under an Emergency Use Authorization (EUA). This EUA will remain  in effect (meaning this test can be used) for the duration of the COVID-19 declaration under Section 564(b)(1) of the Act, 21 U.S.C.section 360bbb-3(b)(1), unless the authorization is terminated  or revoked sooner.       Influenza A by PCR NEGATIVE NEGATIVE Final   Influenza B by PCR NEGATIVE NEGATIVE Final    Comment: (NOTE) The Xpert Xpress SARS-CoV-2/FLU/RSV plus assay is intended as an aid in the diagnosis of influenza from Nasopharyngeal swab specimens and should not be used as a sole basis for treatment. Nasal washings and aspirates are unacceptable for Xpert Xpress SARS-CoV-2/FLU/RSV testing.  Fact Sheet for Patients: EntrepreneurPulse.com.au  Fact Sheet for Healthcare Providers: IncredibleEmployment.be  This test is not yet approved or cleared by the [place] FDA and has been authorized for detection and/or diagnosis of SARS-CoV-2 by FDA under an Emergency Use Authorization (EUA). This EUA will  remain in effect (meaning this test can be used) for the duration of the COVID-19 declaration under Section 564(b)(1) of the Act, 21 U.S.C. section 360bbb-3(b)(1), unless the authorization is terminated or revoked.  Performed at [facility], [address] [place], [place] [address]   Blood culture (routine x 2)     Status: None   Collection Time: 10/25/21 11:43 PM   Specimen: BLOOD  Result Value Ref Range Status   Specimen Description BLOOD LEFT ASSIST CONTROL  Final   Special Requests   Final    BOTTLES DRAWN AEROBIC AND ANAEROBIC Blood Culture adequate volume   Culture   Final    NO GROWTH 5 DAYS Performed at [facility], [address] [place], [place] [address]    Report Status 10/31/2021 FINAL  Final  Resp Panel by RT-PCR (Flu A&B, Covid) Nasopharyngeal Swab     Status: None   Collection Time: 10/31/21  1:35 PM   Specimen: Nasopharyngeal Swab; Nasopharyngeal(NP) swabs in vial transport medium  Result Value Ref Range Status   SARS Coronavirus 2 by RT PCR NEGATIVE NEGATIVE Final    Comment: (NOTE) SARS-CoV-2 target nucleic acids are NOT DETECTED.  The SARS-CoV-2 RNA is  generally detectable in upper respiratory specimens during the acute phase of infection. The lowest concentration of SARS-CoV-2 viral copies this assay can detect is 138 copies/mL. A negative result does not preclude SARS-Cov-2 infection and should not be used as the sole basis for treatment or other patient management decisions. A negative result may occur with  improper specimen collection/handling, submission of specimen other than nasopharyngeal swab, presence of viral mutation(s) within the areas targeted by this assay, and inadequate number of viral copies(<138 copies/mL). A negative result must be combined with clinical observations, patient history, and epidemiological information. The expected result is Negative.  Fact Sheet for Patients:   EntrepreneurPulse.com.au  Fact Sheet for Healthcare Providers:  IncredibleEmployment.be  This test is no t yet approved or cleared by the [place] FDA and  has been authorized for detection and/or diagnosis of SARS-CoV-2 by FDA under an Emergency Use Authorization (EUA). This EUA will remain  in effect (meaning this test can be used) for the duration of the COVID-19 declaration under Section 564(b)(1) of the Act, 21 U.S.C.section 360bbb-3(b)(1), unless the authorization is terminated  or revoked sooner.       Influenza A by PCR NEGATIVE NEGATIVE Final   Influenza B by PCR NEGATIVE NEGATIVE Final    Comment: (NOTE) The Xpert Xpress SARS-CoV-2/FLU/RSV plus assay is intended as an aid in the diagnosis of influenza from Nasopharyngeal swab specimens and should not be used as a sole basis for treatment. Nasal washings and aspirates are unacceptable for Xpert Xpress SARS-CoV-2/FLU/RSV testing.  Fact Sheet for Patients: EntrepreneurPulse.com.au  Fact Sheet for Healthcare Providers: IncredibleEmployment.be  This test is not yet approved or cleared by the [place] FDA and has been authorized for detection and/or diagnosis of SARS-CoV-2 by FDA under an Emergency Use Authorization (EUA). This EUA will remain in effect (meaning this test can be used) for the duration of the COVID-19 declaration under Section 564(b)(1) of the Act, 21 U.S.C. section 360bbb-3(b)(1), unless the authorization is terminated or revoked.  Performed at [facility], [address] [place], [place] [address]     Coagulation Studies: No results for input(s): LABPROT, INR in the last 72 hours.  Urinalysis: No results for input(s): COLORURINE, LABSPEC, PHURINE, GLUCOSEU, HGBUR, BILIRUBINUR, KETONESUR, PROTEINUR, UROBILINOGEN, NITRITE, LEUKOCYTESUR in the last 72 hours.  Invalid input(s): APPERANCEUR      Imaging: No results found.   Medications:      (feeding supplement) PROSource Plus  30 mL Oral BID BM   aspirin EC  81 mg Oral BID   calcitRIOL  0.25 mcg Oral Daily   guaiFENesin  600 mg Oral BID   heparin injection (subcutaneous)  5,000 Units Subcutaneous Q8H   levothyroxine  75 mcg Oral Q0600   liothyronine  5 mcg Oral Daily   metolazone  2.5 mg Oral Once per day on Mon Fri   midodrine  10 mg Oral TID WC   multivitamin with minerals  1 tablet Oral Daily   pravastatin  20 mg Oral QPM   torsemide  40 mg Oral Daily   acetaminophen **OR** acetaminophen, albuterol, hydrocerin, ondansetron **OR** ondansetron (ZOFRAN) IV, polyethylene glycol, traZODone  Assessment/ Plan:  Mr. TIMO HARTWIG is a 88 y.o.  male with past medical history of hypertension, dyslipidemia, aortic aneurysm, and stage IV CKD.  Patient presents from rehab facility with fluid overload.  With suspected to be dehydrated and prescribed IV fluids.  Patient states no shortness of breath but bilateral lower extremity and abdominal edema.  Patient has been admitted for Acute pulmonary edema (HCC) [J81.0] Acute CHF (congestive heart failure) (HCC) [I50.9] Urinary tract infection without hematuria, site unspecified [N39.0]   Acute Kidney Injury with hyponatremia on chronic kidney disease stage IV with baseline creatinine 3.54 and GFR of 16 on 10/14/21.  Acute kidney injury secondary to cardiorenal syndrome Chronic kidney disease secondary to hypertension and vascular disease. No IV contrast exposure. No acute indication for dialysis at this time No longer interested in dialysis -Continue torsemide 40 mg daily, adequate urine output with no complains of respiratory distress. - Sodium improved to 130 -Discharge planning-insurance has denied rehab, currently awaiting appeal   Lab Results  Component Value Date   CREATININE 3.85 (H) 11/03/2021   CREATININE 3.87 (H) 11/02/2021   CREATININE 4.06 (H) 11/01/2021     Intake/Output Summary (Last 24 hours) at 11/03/2021 1222 Last data filed at 11/03/2021 1052 Gross per 24 hour  Intake 480 ml  Output 2600 ml  Net -2120 ml    2.  Chronic systolic heart failure.  Echo from 10/03/2021 shows EF less than 20% with left ventricular hypokinesis.  Cardiology following    LOS: 9 [place] 2/17/202312:22 PM

## 2021-11-03 NOTE — Progress Notes (Signed)
Physical Therapy Treatment Patient Details Name: [name] MRN: [id number] DOB: 30-Dec-1930 Today's Date: 11/03/2021   History of Present Illness Pt is an 88 y.o male with significant PMH of  CKD stage IV, hypertension, proteinuria, hyperparathyroidism, HLD, aspiration pneumonia, EF 15-20%, ischemic cardiomyopathy, bilateral carotid stenosis, severe aortic stenosis, AAA, MGUS, paroxysmal SVT, CABG, PDA, status post bioprosthetic valve replacement in 2011 who presented to the ED with unresponsiveness. MD assessment includes: Myxedema coma, cadiogenic shock, acute on chronic CHF, AKI, thrombocytopenia, anemia, A-fib, acute respiratory failure, pressure injury of the buttock stage 2, and hyponatremia.    PT Comments    Pt was awake, long sitting in bed attempting to call breakfast order in. Author assisted pt with ordering breakfast. Pt endorses feel "ok" overall. He is alert but only oriented x 2. Once reoriented cognition seemed to improve. Author does feel pt has overall poor insight of his deficits and current medical state. He was on 3 L o2 throughout session. 95% sao2 at rest however with very minimal movements, does drop to 89% several times. He presents with severe weakness throughout. Myoclonus movements present during all mobility, transfers, and during gait. Knees do tend to buckle but pt is able to prevent falling with min assist. He becomes SOB with just a little activity. Prolonged recovery time afterwards. Overall pt is pleasant and motivated to improve. Has shoe lift ibn one shoe however he elected not to wear his shoes during session. Will need extensive PT going forward to address deficits while improving safety with all ADLs.    Recommendations for follow up therapy are one component of a multi-disciplinary discharge planning process, led by the attending physician.  Recommendations may be updated based on patient status, additional functional criteria and insurance  authorization.  Follow Up Recommendations  Skilled nursing-short term rehab (<3 hours/day)     Assistance Recommended at Discharge Frequent or constant Supervision/Assistance  Patient can return home with the following A lot of help with walking and/or transfers;A lot of help with bathing/dressing/bathroom;Assistance with cooking/housework;Assistance with feeding;Direct supervision/assist for medications management;Direct supervision/assist for financial management;Assist for transportation;Help with stairs or ramp for entrance   Equipment Recommendations  Rolling walker (2 wheels);Wheelchair (measurements PT);Wheelchair cushion (measurements PT);Hospital bed;BSC/3in1       Precautions / Restrictions Precautions Precautions: Fall Restrictions Weight Bearing Restrictions: No     Mobility  Bed Mobility Overal bed mobility: Needs Assistance Bed Mobility: Supine to Sit Rolling: Min assist Sidelying to sit: Min assist, HOB elevated Supine to sit: Min assist     General bed mobility comments: Pt was able to exit R side of bed with increased time and min assist. HOB elevated with heavy use of bed rails to achieve. SOB noted that resolved after static sitting x ~ 4 minutes.    Transfers Overall transfer level: Needs assistance Equipment used: Rolling walker (2 wheels) Transfers: Sit to/from Stand Sit to Stand: Min assist, From elevated surface           General transfer comment: Pt was able to STS 3 x EOB. Throughout session pt has myoclonus movements. Very limited activity tolerance overall due to SOB and weakness throughout. Pt able to tolerate standing ~ 1-2 minutes each trial.    Ambulation/Gait Ambulation/Gait assistance: Min assist Gait Distance (Feet): 3 Feet Assistive device: Rolling walker (2 wheels) Gait Pattern/deviations: Step-to pattern Gait velocity: decreased     General Gait Details: Pt took several steps from EOB to recliner. Myoclonus/knee buckling  present throughout. Pt states he  did not have these "jerking movements prior to admission."     Balance Overall balance assessment: Needs assistance Sitting-balance support: Feet supported Sitting balance-Leahy Scale: Fair     Standing balance support: Bilateral upper extremity supported, During functional activity Standing balance-Leahy Scale: Poor Standing balance comment: high fall risk due to weakness/ myoclonus/knee buckling       Cognition Arousal/Alertness: Awake/alert Behavior During Therapy: WFL for tasks assessed/performed Overall Cognitive Status: Within Functional Limits for tasks assessed      General Comments: Pt is A and oriented x 2. He does require some reorientation to current situtation. on 2 L o2 with sao2 dropping to 89% during activity. SOB noted           General Comments General comments (skin integrity, edema, etc.): pt fatigued from minimal activity. He was repositioned in recliner post session with call bell in reach and chair alarm in place. HR 68 sao2 93% on 3 L      Pertinent Vitals/Pain Pain Assessment Pain Assessment: No/denies pain     PT Goals (current goals can now be found in the care plan section) Acute Rehab PT Goals Patient Stated Goal: none stated Progress towards PT goals: Progressing toward goals    Frequency    Min 2X/week      PT Plan Current plan remains appropriate       AM-PAC PT "6 Clicks" Mobility   Outcome Measure  Help needed turning from your back to your side while in a flat bed without using bedrails?: A Little Help needed moving from lying on your back to sitting on the side of a flat bed without using bedrails?: A Lot Help needed moving to and from a bed to a chair (including a wheelchair)?: A Lot Help needed standing up from a chair using your arms (e.g., wheelchair or bedside chair)?: A Lot Help needed to walk in hospital room?: A Lot Help needed climbing 3-5 steps with a railing? : A Lot 6 Click  Score: 13    End of Session Equipment Utilized During Treatment: Oxygen Activity Tolerance: Patient tolerated treatment well;Patient limited by fatigue Patient left: in chair;with call bell/phone within reach;with chair alarm set Nurse Communication: Mobility status PT Visit Diagnosis: Unsteadiness on feet (R26.81);Difficulty in walking, not elsewhere classified (R26.2);Muscle weakness (generalized) (M62.81)     Time: 4401-0272 PT Time Calculation (min) (ACUTE ONLY): 27 min  Charges:  $Therapeutic Activity: 23-37 mins                     Julaine Fusi PTA 11/03/21, 7:42 AM

## 2021-11-03 NOTE — TOC Progression Note (Signed)
Transition of Care [facility]) - Progression Note    Patient Details  Name: [name] MRN: [id number] Date of Birth: 1931-05-30  [facility]) CM/SW Contact  Eileen Stanford, LCSW Phone Number: 11/03/2021, 4:07 PM  Clinical Narrative:   CSW following. Per [facility] Rep Pt to discuss with Son tonight regarding decision of hospice.    Expected Discharge Plan: [place] Barriers to Discharge: Continued Medical Work up  Expected Discharge Plan and Services Expected Discharge Plan: [place] arrangements for the past 2 months: [place]                                       Social Determinants of Health (SDOH) Interventions    Readmission Risk Interventions Readmission Risk Prevention Plan 05/31/2021  Transportation Screening Complete  PCP or Specialist Appt within 3-5 Days Complete  Social Work Consult for [place] Planning/Counseling Complete  Palliative Care Screening Not Applicable  Medication Review Press photographer) Complete  Some recent data might be hidden

## 2021-11-04 DIAGNOSIS — D638 Anemia in other chronic diseases classified elsewhere: Secondary | ICD-10-CM | POA: Diagnosis not present

## 2021-11-04 DIAGNOSIS — I5023 Acute on chronic systolic (congestive) heart failure: Secondary | ICD-10-CM | POA: Diagnosis not present

## 2021-11-04 DIAGNOSIS — I480 Paroxysmal atrial fibrillation: Secondary | ICD-10-CM | POA: Diagnosis not present

## 2021-11-04 DIAGNOSIS — N390 Urinary tract infection, site not specified: Secondary | ICD-10-CM | POA: Diagnosis not present

## 2021-11-04 NOTE — Progress Notes (Signed)
Progress Note   Patient: [name] HQP:[id number] DOB: [birth year] DOA: 10/25/2021     10 DOS: the patient was seen and examined on 11/04/2021    Brief hospital course: [name] is an 19 yr. M with CKD V, baseline 3.9, sCHF EF <20% due to ischemic CM, AS and CAD s/p CABG and AVR 2011, pAF not on AC, PVD and recent life-threatening myxedema coma who presented from SNF with abnormal labs.   Prior to admission, patient had recently been discharged from hospital to SNF after myxedema crisis, had been at [facility] for about 10 days prior to admission, felt like he was "getting better".  Able to get out of bed, more alert.   In the last few days prior to admission he was told that he had "dehydration", so was given IV fluids at the facility.  Then sent to the ER for "dehydration".   In the ER, creatinine up to 4.4, up from 3.5 at discharge.  He had peripheral edema, BNP greater than 4500, and chest x-ray with worsened bilateral airspace opacities.  Started on Lasix and admitted.  11/04/21- stable. Continues to be medically stable and is awaiting safe disposition plan  Assessment and Plan:  Acute on chronic systolic CHF ([place])- (present on admission) appears euvolemic. - torsemide 40 daily - metolazone 2.5 twice weekly   - Consulted Nephrology, appreciate expertise  Mild hyponatremia- stable   hyperkalemia- resolved - BMP am  Hypothyroidism Recently nearly died from Myxedema coma.  Clearly improving from the standpoint of thyroid failure, but overall prognosis is still dire given the underlying pre-existing chronic renal and cardiac failure.  TSH improving as expected. -Continue levothyroxine, liothyronine - If consistent with goals of care, would recommend Endocrinology follow up after discharge  Schistocytes on peripheral blood smear-  CBC has remained normal.   MAHA ruled out.  No further work up necessary.  Anemia in other chronic diseases classified elsewhere- (present on  admission) Hgb stable, no clinical bleeding  AF (paroxysmal atrial fibrillation) ([place])- (present on admission) Not on anticoagulation due to pre-existing factors. -Continue low dose aspirin  Pressure injury of buttock, stage 2 ([place])- (present on admission) - Local wound care  CKD (chronic kidney disease), stage V ([place])- (present on admission) Creatinine gradually improved with diuresis.  Likely his worsening renal function at his facility was congestion not 'dehydration'.  Patient continues to decline dialysis which will limit life expectancy. Good UOP. Cr 3.85 - nephrology following, appreciate recs - palliative consulted, appreciate recs  H/o Essential HTN- hypotensive since admission - Hold losartan - Continue midodrine - Continue torsemide  Goals of care- disposition is undecided from family standpoint. Insurance denied SNF. Family and patient have been presented with options for discharge. Daughter is appealing decision.  UPDATE: patient has decided to move forward with hospice care.   Subjective: patient reports he's doing good. He wants to be discharged home with hospice.   Physical Exam: Vitals:   11/03/21 1636 11/03/21 2225 11/04/21 0500 11/04/21 0512  BP: 98/65 96/66  96/70  Pulse: 62 60  65  Resp: 19 18  16   Temp: 98.1 F (36.7 C) (!) 97.3 F (36.3 C)  97.7 F (36.5 C)  TempSrc:  Oral    SpO2: 98% 98%  100%  Weight:   66.5 kg   Height:       General: NAD, able to participate in exam Cardiac: RRR, normal heart sounds, no murmurs. Respiratory:normal effort Extremities: bilateral LE edema. WWP. Skin: warm and dry,  no rashes noted on exposed skin Neuro: alert and oriented, no focal deficits Psych: flat affect and mood  Data Reviewed: Cr 4.11>4.06>3.87>3.85 GFR 13>14 Na+ 131>130 K+ 5.0>4.6  Family Communication: none  Disposition: Status is: Inpatient Remains inpatient appropriate because: The patient is medically cleared for discharge to skilled  nursing facility as soon as a bed is available   Planned Discharge Destination: Skilled nursing facility vs hospice  VTE Ppx: heparin injection 5,000 Units Start: 10/26/21 0830   Author: Richarda Osmond, MD 11/04/2021 7:22 AM  For on call review [email or web address].

## 2021-11-04 NOTE — Progress Notes (Addendum)
[facility] ([facility])  Attempted to meet with patient and his family. Pt was asleep and no family in room.   Contacted son [name] to determine what the outcome of his discussion was with his father the previous night.  Per [name], Mr. Dineen is adamantly against hospice. The situation is complicated by one the his siblings also against hospice involvement.  [name] is not sure what to do as his father is still acutely alert and the decision maker for himself. Offered to meet with him and his father this afternoon, but [name] is unable to drive due to a back injury. Cecille Rubin wife is in [place] and set to return Sunday afternoon. They request we meet Monday morning to discuss collectively as a family.  Suggested that possibly PMT could help with GOC, however [name] advised that his father did not appreciate their meeting at the last time (did not like the truthfulness of the conversation, although the family appreciated it) and did not necessarily want them to consult again.  Updated the hospital team.   Please reach out if we can be of further assistance. Otherwise, we will plan to meet with the patient and family Monday morning.   Venia Carbon BSN, RN [facility] Liaison

## 2021-11-04 NOTE — Progress Notes (Signed)
[facility]  ROUNDING NOTE   Subjective:   [name] is a 88 year old with past medical history of hypertension, dyslipidemia, aortic aneurysm, and stage IV CKD.  Patient presents from rehab facility with fluid overloa.  With suspected to be dehydrated and prescribed IV fluids.  Patient states no shortness of breath but bilateral lower extremity and abdominal edema.  Patient has been admitted for Acute pulmonary edema (HCC) [J81.0] Acute CHF (congestive heart failure) ([place]) [I50.9] Urinary tract infection without hematuria, site unspecified [N39.0]  Patient is known to our practice and receives outpatient care from Dr. Holley Raring.    Patient is resting comfortably in the bed.  Alert and oriented, and tolerating meals without nausea and vomiting Stated that he does not want to go for dialysis since he was told that his prognosis is poor .    Objective:  Vital signs in last 24 hours:  Temp:  [97.3 F (36.3 C)-98.1 F (36.7 C)] 98 F (36.7 C) (02/18 1206) Pulse Rate:  [60-68] 61 (02/18 1206) Resp:  [16-20] 20 (02/18 1206) BP: (93-98)/(63-70) 93/63 (02/18 1206) SpO2:  [98 %-100 %] 98 % (02/18 1206) Weight:  [66.5 kg] 66.5 kg (02/18 0500)  Weight change: -7.7 kg Filed Weights   11/02/21 0500 11/03/21 0500 11/04/21 0500  Weight: 68.3 kg 74.2 kg 66.5 kg    Intake/Output: I/O last 3 completed shifts: In: 600 [P.O.:600] Out: 3400 [Urine:3400]   Intake/Output this shift:  Total I/O In: 300 [P.O.:100; Other:200] Out: 800 [Urine:800]  Physical Exam: General: NAD  Head: Normocephalic, atraumatic. Moist oral mucosal membranes  Eyes: Anicteric  Lungs:  Crackles more on left, normal effort, O2   Heart: Regular rate and rhythm  Abdomen:  Soft, nontender  Extremities:  Trace edema.  Neurologic: Nonfocal, moving all four extremities  Skin: No lesions       Basic Metabolic Panel: Recent Labs  Lab 10/30/21 0509 10/31/21 0416 11/01/21 0407 11/02/21 0505  11/03/21 0359  NA 131* 131* 129* 131* 130*  K 4.8 4.9 5.3* 5.0 4.6  CL 95* 97* 95* 94* 95*  CO2 21* 24 24 25 24   GLUCOSE 99 94 88 88 92  BUN 99* 148* 143* 145* 138*  CREATININE 4.09* 4.11* 4.06* 3.87* 3.85*  CALCIUM 8.8* 8.8* 8.7* 8.8* 8.7*     Liver Function Tests: No results for input(s): AST, ALT, ALKPHOS, BILITOT, PROT, ALBUMIN in the last 168 hours.  No results for input(s): LIPASE, AMYLASE in the last 168 hours. No results for input(s): AMMONIA in the last 168 hours.  CBC: Recent Labs  Lab 11/01/21 0407  WBC 4.0  HGB 8.9*  HCT 27.7*  MCV 108.2*  PLT 143*     Cardiac Enzymes: No results for input(s): CKTOTAL, CKMB, CKMBINDEX, TROPONINI in the last 168 hours.  BNP: Invalid input(s): POCBNP  CBG: No results for input(s): GLUCAP in the last 168 hours.  Microbiology: Results for orders placed or performed during the hospital encounter of 10/25/21  Blood culture (routine x 2)     Status: None   Collection Time: 10/25/21 11:41 PM   Specimen: BLOOD  Result Value Ref Range Status   Specimen Description BLOOD LEFT ASSIST CONTROL  Final   Special Requests   Final    BOTTLES DRAWN AEROBIC AND ANAEROBIC Blood Culture adequate volume   Culture   Final    NO GROWTH 5 DAYS Performed at [facility], [address], [place], [place] [address]    Report Status  10/31/2021 FINAL  Final  Resp Panel by RT-PCR (Flu A&B, Covid) Nasopharyngeal Swab     Status: None   Collection Time: 10/25/21 11:41 PM   Specimen: Nasopharyngeal Swab; Nasopharyngeal(NP) swabs in vial transport medium  Result Value Ref Range Status   SARS Coronavirus 2 by RT PCR NEGATIVE NEGATIVE Final    Comment: (NOTE) SARS-CoV-2 target nucleic acids are NOT DETECTED.  The SARS-CoV-2 RNA is generally detectable in upper respiratory specimens during the acute phase of infection. The lowest concentration of SARS-CoV-2 viral copies this assay can detect is 138 copies/mL. A negative result does not  preclude SARS-Cov-2 infection and should not be used as the sole basis for treatment or other patient management decisions. A negative result may occur with  improper specimen collection/handling, submission of specimen other than nasopharyngeal swab, presence of viral mutation(s) within the areas targeted by this assay, and inadequate number of viral copies(<138 copies/mL). A negative result must be combined with clinical observations, patient history, and epidemiological information. The expected result is Negative.  Fact Sheet for Patients:  EntrepreneurPulse.com.au  Fact Sheet for Healthcare Providers:  IncredibleEmployment.be  This test is no t yet approved or cleared by the [place] FDA and  has been authorized for detection and/or diagnosis of SARS-CoV-2 by FDA under an Emergency Use Authorization (EUA). This EUA will remain  in effect (meaning this test can be used) for the duration of the COVID-19 declaration under Section 564(b)(1) of the Act, 21 U.S.C.section 360bbb-3(b)(1), unless the authorization is terminated  or revoked sooner.       Influenza A by PCR NEGATIVE NEGATIVE Final   Influenza B by PCR NEGATIVE NEGATIVE Final    Comment: (NOTE) The Xpert Xpress SARS-CoV-2/FLU/RSV plus assay is intended as an aid in the diagnosis of influenza from Nasopharyngeal swab specimens and should not be used as a sole basis for treatment. Nasal washings and aspirates are unacceptable for Xpert Xpress SARS-CoV-2/FLU/RSV testing.  Fact Sheet for Patients: EntrepreneurPulse.com.au  Fact Sheet for Healthcare Providers: IncredibleEmployment.be  This test is not yet approved or cleared by the [place] FDA and has been authorized for detection and/or diagnosis of SARS-CoV-2 by FDA under an Emergency Use Authorization (EUA). This EUA will remain in effect (meaning this test can be used) for the  duration of the COVID-19 declaration under Section 564(b)(1) of the Act, 21 U.S.C. section 360bbb-3(b)(1), unless the authorization is terminated or revoked.  Performed at [facility], [address] [place], [place] [address]   Blood culture (routine x 2)     Status: None   Collection Time: 10/25/21 11:43 PM   Specimen: BLOOD  Result Value Ref Range Status   Specimen Description BLOOD LEFT ASSIST CONTROL  Final   Special Requests   Final    BOTTLES DRAWN AEROBIC AND ANAEROBIC Blood Culture adequate volume   Culture   Final    NO GROWTH 5 DAYS Performed at [facility], [address] [name], [place] [address]    Report Status 10/31/2021 FINAL  Final  Resp Panel by RT-PCR (Flu A&B, Covid) Nasopharyngeal Swab     Status: None   Collection Time: 10/31/21  1:35 PM   Specimen: Nasopharyngeal Swab; Nasopharyngeal(NP) swabs in vial transport medium  Result Value Ref Range Status   SARS Coronavirus 2 by RT PCR NEGATIVE NEGATIVE Final    Comment: (NOTE) SARS-CoV-2 target nucleic acids are NOT DETECTED.  The SARS-CoV-2 RNA is generally detectable in upper respiratory specimens during the acute phase  of infection. The lowest concentration of SARS-CoV-2 viral copies this assay can detect is 138 copies/mL. A negative result does not preclude SARS-Cov-2 infection and should not be used as the sole basis for treatment or other patient management decisions. A negative result may occur with  improper specimen collection/handling, submission of specimen other than nasopharyngeal swab, presence of viral mutation(s) within the areas targeted by this assay, and inadequate number of viral copies(<138 copies/mL). A negative result must be combined with clinical observations, patient history, and epidemiological information. The expected result is Negative.  Fact Sheet for Patients:  EntrepreneurPulse.com.au  Fact Sheet for Healthcare Providers:   IncredibleEmployment.be  This test is no t yet approved or cleared by the [place] FDA and  has been authorized for detection and/or diagnosis of SARS-CoV-2 by FDA under an Emergency Use Authorization (EUA). This EUA will remain  in effect (meaning this test can be used) for the duration of the COVID-19 declaration under Section 564(b)(1) of the Act, 21 U.S.C.section 360bbb-3(b)(1), unless the authorization is terminated  or revoked sooner.       Influenza A by PCR NEGATIVE NEGATIVE Final   Influenza B by PCR NEGATIVE NEGATIVE Final    Comment: (NOTE) The Xpert Xpress SARS-CoV-2/FLU/RSV plus assay is intended as an aid in the diagnosis of influenza from Nasopharyngeal swab specimens and should not be used as a sole basis for treatment. Nasal washings and aspirates are unacceptable for Xpert Xpress SARS-CoV-2/FLU/RSV testing.  Fact Sheet for Patients: EntrepreneurPulse.com.au  Fact Sheet for Healthcare Providers: IncredibleEmployment.be  This test is not yet approved or cleared by the [place] FDA and has been authorized for detection and/or diagnosis of SARS-CoV-2 by FDA under an Emergency Use Authorization (EUA). This EUA will remain in effect (meaning this test can be used) for the duration of the COVID-19 declaration under Section 564(b)(1) of the Act, 21 U.S.C. section 360bbb-3(b)(1), unless the authorization is terminated or revoked.  Performed at [facility], [address] [place], [place] [address]     Coagulation Studies: No results for input(s): LABPROT, INR in the last 72 hours.  Urinalysis: No results for input(s): COLORURINE, LABSPEC, PHURINE, GLUCOSEU, HGBUR, BILIRUBINUR, KETONESUR, PROTEINUR, UROBILINOGEN, NITRITE, LEUKOCYTESUR in the last 72 hours.  Invalid input(s): APPERANCEUR     Imaging: No results found.   Medications:      (feeding supplement) PROSource Plus  30  mL Oral BID BM   aspirin EC  81 mg Oral BID   calcitRIOL  0.25 mcg Oral Daily   guaiFENesin  600 mg Oral BID   heparin injection (subcutaneous)  5,000 Units Subcutaneous Q8H   levothyroxine  75 mcg Oral Q0600   liothyronine  5 mcg Oral Daily   metolazone  2.5 mg Oral Once per day on Mon Fri   midodrine  10 mg Oral TID WC   multivitamin with minerals  1 tablet Oral Daily   pravastatin  20 mg Oral QPM   torsemide  40 mg Oral Daily   acetaminophen **OR** acetaminophen, albuterol, hydrocerin, ondansetron **OR** ondansetron (ZOFRAN) IV, polyethylene glycol, traZODone  Assessment/ Plan:  Mr. KHALI ALBANESE is a 88 y.o.  male with past medical history of hypertension, dyslipidemia, aortic aneurysm, and stage IV CKD.  Patient presents from rehab facility with fluid overload.  With suspected to be dehydrated and prescribed IV fluids.  Patient states no shortness of breath but bilateral lower extremity and abdominal edema.  Patient has been admitted for Acute pulmonary edema ([place]) [J81.0]  Acute CHF (congestive heart failure) (HCC) [I50.9] Urinary tract infection without hematuria, site unspecified [N39.0]   Acute Kidney Injury with hyponatremia on chronic kidney disease stage IV with baseline creatinine 3.54 and GFR of 16 on 10/14/21.  Acute kidney injury secondary to cardiorenal syndrome Chronic kidney disease secondary to hypertension and vascular disease. No IV contrast exposure. No acute indication for dialysis at this time No longer interested in dialysis due to poor prognosis.  -Continue torsemide 40 mg daily, adequate urine output. - Sodium improved to 130 - Unable to discharge to rehab due to no insurance approval.    Lab Results  Component Value Date   CREATININE 3.85 (H) 11/03/2021   CREATININE 3.87 (H) 11/02/2021   CREATININE 4.06 (H) 11/01/2021    Intake/Output Summary (Last 24 hours) at 11/04/2021 1337 Last data filed at 11/04/2021 0900 Gross per 24 hour  Intake 660 ml   Output 2700 ml  Net -2040 ml    2.  Chronic systolic heart failure.  S/P Echo 10/03/2021 with EF less than 20% with left ventricular hypokinesis. Follow up cardiology.     LOS: [place] Jesselle Laflamme 2/18/20231:37 PM

## 2021-11-04 NOTE — TOC Progression Note (Addendum)
Transition of Care [facility]) - Progression Note    Patient Details  Name: [name] MRN: [id number] Date of Birth: 08-Mar-1931  [facility] CM/SW [place], LCSW Phone Number: 11/04/2021, 10:20 AM  Clinical Narrative:    Per MD, patient again verbalizes he wants to go home with hospice.  CSW spoke with Primary school teacher. Anderson Malta stated she is meeting with patient and son today, will update CSW after.   3:20- [facility] to have a family meeting on Monday per [employer].   Expected Discharge Plan: [place] Barriers to Discharge: Continued Medical Work up  Expected Discharge Plan and Services Expected Discharge Plan: [place] arrangements for the past 2 months: [place]                                       Social Determinants of Health (SDOH) Interventions    Readmission Risk Interventions Readmission Risk Prevention Plan 05/31/2021  Transportation Screening Complete  PCP or Specialist Appt within 3-5 Days Complete  Social Work Consult for [place] Planning/Counseling Complete  Palliative Care Screening Not Applicable  Medication Review Press photographer) Complete  Some recent data might be hidden

## 2021-11-05 DIAGNOSIS — I5023 Acute on chronic systolic (congestive) heart failure: Secondary | ICD-10-CM | POA: Diagnosis not present

## 2021-11-05 DIAGNOSIS — I480 Paroxysmal atrial fibrillation: Secondary | ICD-10-CM | POA: Diagnosis not present

## 2021-11-05 DIAGNOSIS — J810 Acute pulmonary edema: Secondary | ICD-10-CM | POA: Diagnosis not present

## 2021-11-05 DIAGNOSIS — N390 Urinary tract infection, site not specified: Secondary | ICD-10-CM | POA: Diagnosis not present

## 2021-11-05 LAB — BASIC METABOLIC PANEL
Anion gap: 15 (ref 5–15)
BUN: 131 mg/dL — ABNORMAL HIGH (ref 8–23)
CO2: 27 mmol/L (ref 22–32)
Calcium: 8.8 mg/dL — ABNORMAL LOW (ref 8.9–10.3)
Chloride: 87 mmol/L — ABNORMAL LOW (ref 98–111)
Creatinine, Ser: 3.95 mg/dL — ABNORMAL HIGH (ref 0.61–1.24)
GFR, Estimated: 14 mL/min — ABNORMAL LOW (ref >60)
Glucose, Bld: 94 mg/dL (ref 70–99)
Potassium: 4.1 mmol/L (ref 3.5–5.1)
Sodium: 129 mmol/L — ABNORMAL LOW (ref 135–145)

## 2021-11-05 LAB — GLUCOSE, CAPILLARY: Glucose-Capillary: 111 mg/dL — ABNORMAL HIGH (ref 70–99)

## 2021-11-05 LAB — CBC
HCT: 26.9 % — ABNORMAL LOW (ref 39.0–52.0)
Hemoglobin: 8.5 g/dL — ABNORMAL LOW (ref 13.0–17.0)
MCH: 34 pg (ref 26.0–34.0)
MCHC: 31.6 g/dL (ref 30.0–36.0)
MCV: 107.6 fL — ABNORMAL HIGH (ref 80.0–100.0)
Platelets: 126 10*3/uL — ABNORMAL LOW (ref 150–400)
RBC: 2.5 MIL/uL — ABNORMAL LOW (ref 4.22–5.81)
RDW: 17.4 % — ABNORMAL HIGH (ref 11.5–15.5)
WBC: 4.2 10*3/uL (ref 4.0–10.5)
nRBC: 0 % (ref 0.0–0.2)

## 2021-11-05 MED ORDER — METOLAZONE 5 MG PO TABS
5.0000 mg | ORAL_TABLET | Freq: Once | ORAL | Status: AC
  Administered 2021-11-05: 5 mg via ORAL
  Filled 2021-11-05: qty 1

## 2021-11-05 NOTE — Progress Notes (Signed)
[facility]  ROUNDING NOTE   Subjective:   [name] is a 88 year old with past medical history of hypertension, dyslipidemia, aortic aneurysm, and stage IV CKD.  Patient presents from rehab facility with fluid overloa.  With suspected to be dehydrated and prescribed IV fluids.  Patient states no shortness of breath but bilateral lower extremity and abdominal edema.  Patient has been admitted for Acute pulmonary edema (HCC) [J81.0] Acute CHF (congestive heart failure) ([place]) [I50.9] Urinary tract infection without hematuria, site unspecified [N39.0]  Patient is known to our practice and receives outpatient care from Dr. Holley Raring.    Patient is resting comfortably in the bed.  Alert and oriented, and tolerating meals.  Complains shortness of breath. Pt not want to go for dialysis since he was told that his prognosis is poor.    Objective:  Vital signs in last 24 hours:  Temp:  [97.5 F (36.4 C)-97.7 F (36.5 C)] 97.5 F (36.4 C) (02/19 0746) Pulse Rate:  [63-67] 63 (02/19 0746) Resp:  [16-18] 16 (02/19 0746) BP: (95-103)/(66-68) 99/66 (02/19 0746) SpO2:  [96 %-98 %] 98 % (02/19 0746) Weight:  [70.8 kg] 70.8 kg (02/19 0500)  Weight change: 4.3 kg Filed Weights   11/03/21 0500 11/04/21 0500 11/05/21 0500  Weight: 74.2 kg 66.5 kg 70.8 kg    Intake/Output: I/O last 3 completed shifts: In: 620 [P.O.:420; Other:200] Out: 3800 [Urine:3800]   Intake/Output this shift:  Total I/O In: 240 [P.O.:240] Out: 800 [Urine:800]  Physical Exam: General: NAD  Head: Normocephalic, atraumatic. Moist oral mucosal membranes  Eyes: Anicteric  Lungs:  Crackles more on left, normal effort, O2   Heart: Regular rate and rhythm  Abdomen:  Soft, nontender  Extremities:  Trace edema.  Neurologic: Nonfocal, moving all four extremities  Skin: No lesions       Basic Metabolic Panel: Recent Labs  Lab 10/31/21 0416 11/01/21 0407 11/02/21 0505 11/03/21 0359 11/05/21 0532   NA 131* 129* 131* 130* 129*  K 4.9 5.3* 5.0 4.6 4.1  CL 97* 95* 94* 95* 87*  CO2 24 24 25 24 27   GLUCOSE 94 88 88 92 94  BUN 148* 143* 145* 138* 131*  CREATININE 4.11* 4.06* 3.87* 3.85* 3.95*  CALCIUM 8.8* 8.7* 8.8* 8.7* 8.8*     Liver Function Tests: No results for input(s): AST, ALT, ALKPHOS, BILITOT, PROT, ALBUMIN in the last 168 hours.  No results for input(s): LIPASE, AMYLASE in the last 168 hours. No results for input(s): AMMONIA in the last 168 hours.  CBC: Recent Labs  Lab 11/01/21 0407 11/05/21 0532  WBC 4.0 4.2  HGB 8.9* 8.5*  HCT 27.7* 26.9*  MCV 108.2* 107.6*  PLT 143* 126*     Cardiac Enzymes: No results for input(s): CKTOTAL, CKMB, CKMBINDEX, TROPONINI in the last 168 hours.  BNP: Invalid input(s): POCBNP  CBG: No results for input(s): GLUCAP in the last 168 hours.  Microbiology: Results for orders placed or performed during the hospital encounter of 10/25/21  Blood culture (routine x 2)     Status: None   Collection Time: 10/25/21 11:41 PM   Specimen: BLOOD  Result Value Ref Range Status   Specimen Description BLOOD LEFT ASSIST CONTROL  Final   Special Requests   Final    BOTTLES DRAWN AEROBIC AND ANAEROBIC Blood Culture adequate volume   Culture   Final    NO GROWTH 5 DAYS Performed at [facility], [address], [place],  [address]  Report Status 10/31/2021 FINAL  Final  Resp Panel by RT-PCR (Flu A&B, Covid) Nasopharyngeal Swab     Status: None   Collection Time: 10/25/21 11:41 PM   Specimen: Nasopharyngeal Swab; Nasopharyngeal(NP) swabs in vial transport medium  Result Value Ref Range Status   SARS Coronavirus 2 by RT PCR NEGATIVE NEGATIVE Final    Comment: (NOTE) SARS-CoV-2 target nucleic acids are NOT DETECTED.  The SARS-CoV-2 RNA is generally detectable in upper respiratory specimens during the acute phase of infection. The lowest concentration of SARS-CoV-2 viral copies this assay can detect is 138 copies/mL.  A negative result does not preclude SARS-Cov-2 infection and should not be used as the sole basis for treatment or other patient management decisions. A negative result may occur with  improper specimen collection/handling, submission of specimen other than nasopharyngeal swab, presence of viral mutation(s) within the areas targeted by this assay, and inadequate number of viral copies(<138 copies/mL). A negative result must be combined with clinical observations, patient history, and epidemiological information. The expected result is Negative.  Fact Sheet for Patients:  EntrepreneurPulse.com.au  Fact Sheet for Healthcare Providers:  IncredibleEmployment.be  This test is no t yet approved or cleared by the [place] FDA and  has been authorized for detection and/or diagnosis of SARS-CoV-2 by FDA under an Emergency Use Authorization (EUA). This EUA will remain  in effect (meaning this test can be used) for the duration of the COVID-19 declaration under Section 564(b)(1) of the Act, 21 U.S.C.section 360bbb-3(b)(1), unless the authorization is terminated  or revoked sooner.       Influenza A by PCR NEGATIVE NEGATIVE Final   Influenza B by PCR NEGATIVE NEGATIVE Final    Comment: (NOTE) The Xpert Xpress SARS-CoV-2/FLU/RSV plus assay is intended as an aid in the diagnosis of influenza from Nasopharyngeal swab specimens and should not be used as a sole basis for treatment. Nasal washings and aspirates are unacceptable for Xpert Xpress SARS-CoV-2/FLU/RSV testing.  Fact Sheet for Patients: EntrepreneurPulse.com.au  Fact Sheet for Healthcare Providers: IncredibleEmployment.be  This test is not yet approved or cleared by the [place] FDA and has been authorized for detection and/or diagnosis of SARS-CoV-2 by FDA under an Emergency Use Authorization (EUA). This EUA will remain in effect (meaning this test  can be used) for the duration of the COVID-19 declaration under Section 564(b)(1) of the Act, 21 U.S.C. section 360bbb-3(b)(1), unless the authorization is terminated or revoked.  Performed at [facility], [address] [place], [place] [address]   Blood culture (routine x 2)     Status: None   Collection Time: 10/25/21 11:43 PM   Specimen: BLOOD  Result Value Ref Range Status   Specimen Description BLOOD LEFT ASSIST CONTROL  Final   Special Requests   Final    BOTTLES DRAWN AEROBIC AND ANAEROBIC Blood Culture adequate volume   Culture   Final    NO GROWTH 5 DAYS Performed at [facility], [address] [place], [place] [address]    Report Status 10/31/2021 FINAL  Final  Resp Panel by RT-PCR (Flu A&B, Covid) Nasopharyngeal Swab     Status: None   Collection Time: 10/31/21  1:35 PM   Specimen: Nasopharyngeal Swab; Nasopharyngeal(NP) swabs in vial transport medium  Result Value Ref Range Status   SARS Coronavirus 2 by RT PCR NEGATIVE NEGATIVE Final    Comment: (NOTE) SARS-CoV-2 target nucleic acids are NOT DETECTED.  The SARS-CoV-2 RNA is generally detectable in upper respiratory specimens during the  acute phase of infection. The lowest concentration of SARS-CoV-2 viral copies this assay can detect is 138 copies/mL. A negative result does not preclude SARS-Cov-2 infection and should not be used as the sole basis for treatment or other patient management decisions. A negative result may occur with  improper specimen collection/handling, submission of specimen other than nasopharyngeal swab, presence of viral mutation(s) within the areas targeted by this assay, and inadequate number of viral copies(<138 copies/mL). A negative result must be combined with clinical observations, patient history, and epidemiological information. The expected result is Negative.  Fact Sheet for Patients:  EntrepreneurPulse.com.au  Fact Sheet for Healthcare  Providers:  IncredibleEmployment.be  This test is no t yet approved or cleared by the [place] FDA and  has been authorized for detection and/or diagnosis of SARS-CoV-2 by FDA under an Emergency Use Authorization (EUA). This EUA will remain  in effect (meaning this test can be used) for the duration of the COVID-19 declaration under Section 564(b)(1) of the Act, 21 U.S.C.section 360bbb-3(b)(1), unless the authorization is terminated  or revoked sooner.       Influenza A by PCR NEGATIVE NEGATIVE Final   Influenza B by PCR NEGATIVE NEGATIVE Final    Comment: (NOTE) The Xpert Xpress SARS-CoV-2/FLU/RSV plus assay is intended as an aid in the diagnosis of influenza from Nasopharyngeal swab specimens and should not be used as a sole basis for treatment. Nasal washings and aspirates are unacceptable for Xpert Xpress SARS-CoV-2/FLU/RSV testing.  Fact Sheet for Patients: EntrepreneurPulse.com.au  Fact Sheet for Healthcare Providers: IncredibleEmployment.be  This test is not yet approved or cleared by the [place] FDA and has been authorized for detection and/or diagnosis of SARS-CoV-2 by FDA under an Emergency Use Authorization (EUA). This EUA will remain in effect (meaning this test can be used) for the duration of the COVID-19 declaration under Section 564(b)(1) of the Act, 21 U.S.C. section 360bbb-3(b)(1), unless the authorization is terminated or revoked.  Performed at [facility], [address] [place], [place] [address]     Coagulation Studies: No results for input(s): LABPROT, INR in the last 72 hours.  Urinalysis: No results for input(s): COLORURINE, LABSPEC, PHURINE, GLUCOSEU, HGBUR, BILIRUBINUR, KETONESUR, PROTEINUR, UROBILINOGEN, NITRITE, LEUKOCYTESUR in the last 72 hours.  Invalid input(s): APPERANCEUR     Imaging: No results found.   Medications:      (feeding supplement)  PROSource Plus  30 mL Oral BID BM   aspirin EC  81 mg Oral BID   calcitRIOL  0.25 mcg Oral Daily   guaiFENesin  600 mg Oral BID   heparin injection (subcutaneous)  5,000 Units Subcutaneous Q8H   levothyroxine  75 mcg Oral Q0600   liothyronine  5 mcg Oral Daily   metolazone  2.5 mg Oral Once per day on Mon Fri   midodrine  10 mg Oral TID WC   multivitamin with minerals  1 tablet Oral Daily   pravastatin  20 mg Oral QPM   torsemide  40 mg Oral Daily   acetaminophen **OR** acetaminophen, albuterol, hydrocerin, ondansetron **OR** ondansetron (ZOFRAN) IV, polyethylene glycol, traZODone  Assessment/ Plan:  [name] is a 88 y.o.  male with past medical history of hypertension, dyslipidemia, aortic aneurysm, and stage IV CKD.  Patient presents from rehab facility with fluid overload.  With suspected to be dehydrated and prescribed IV fluids.  Patient states no shortness of breath but bilateral lower extremity and abdominal edema.  Patient has been admitted for Acute pulmonary edema (  [employer]) [J81.0] Acute CHF (congestive heart failure) ([place]) [I50.9] Urinary tract infection without hematuria, site unspecified [N39.0]   Acute Kidney Injury with hyponatremia on chronic kidney disease stage IV with baseline creatinine 3.54 and GFR of 16 on 10/14/21.  Acute kidney injury secondary to cardiorenal syndrome Chronic kidney disease secondary to hypertension and vascular disease. No IV contrast exposure. No acute indication for dialysis at this time No longer interested in dialysis due to poor prognosis.  -Continue torsemide 40 mg daily, and metalazone. Will give extra dose of metolazone today.  Monitor urine output - Sodium level fluctuating between 129-131. - Unable to discharge to rehab due to no insurance approval.    Lab Results  Component Value Date   CREATININE 3.95 (H) 11/05/2021   CREATININE 3.85 (H) 11/03/2021   CREATININE 3.87 (H) 11/02/2021    Intake/Output Summary (Last 24  hours) at 11/05/2021 1618 Last data filed at 11/05/2021 1422 Gross per 24 hour  Intake 440 ml  Output 1800 ml  Net -1360 ml    2.  Chronic systolic heart failure.  S/P Echo 10/03/2021 with EF less than 20% with left ventricular hypokinesis. Follow up cardiology.     LOS: Falman Chonte Ricke 2/19/20234:18 PM

## 2021-11-05 NOTE — Progress Notes (Signed)
Progress Note   Patient: [name] BUL:[id number] DOB: August 04, 1931 DOA: 10/25/2021     11 DOS: the patient was seen and examined on 11/05/2021    Brief hospital course: Mr. Labell is an 56 yr. M with CKD V, baseline 3.9, sCHF EF <20% due to ischemic CM, AS and CAD s/p CABG and AVR 2011, pAF not on AC, PVD and recent life-threatening myxedema coma who presented from SNF with abnormal labs.   Prior to admission, patient had recently been discharged from hospital to SNF after myxedema crisis, had been at [facility] for about 10 days prior to admission, felt like he was "getting better".  Able to get out of bed, more alert.   In the last few days prior to admission he was told that he had "dehydration", so was given IV fluids at the facility.  Then sent to the ER for "dehydration".   In the ER, creatinine up to 4.4, up from 3.5 at discharge.  He had peripheral edema, BNP greater than 4500, and chest x-ray with worsened bilateral airspace opacities.  Started on Lasix and admitted.  11/05/21- stable. Continues to be medically stable and is awaiting safe disposition plan  Assessment and Plan:  Acute on chronic systolic CHF ([place])- (present on admission) appears euvolemic. - torsemide 40 daily - metolazone 2.5 twice weekly   - Consulted Nephrology, appreciate expertise  Mild hyponatremia- stable   hyperkalemia- resolved - BMP am  Hypothyroidism Recently nearly died from Myxedema coma.  Clearly improving from the standpoint of thyroid failure, but overall prognosis is still dire given the underlying pre-existing chronic renal and cardiac failure.  TSH improving as expected. -Continue levothyroxine, liothyronine - If consistent with goals of care, would recommend Endocrinology follow up after discharge  Schistocytes on peripheral blood smear-  CBC has remained normal.   MAHA ruled out.  No further work up necessary.  Thrombocytopenia- platelets 126 today. Acute phase reactant - CBC am  Anemia  in other chronic diseases classified elsewhere- (present on admission) Hgb stable, no clinical bleeding  AF (paroxysmal atrial fibrillation) ([place])- (present on admission) Not on anticoagulation due to pre-existing factors. -Continue low dose aspirin  Pressure injury of buttock, stage 2 ([place])- (present on admission) - Local wound care  CKD (chronic kidney disease), stage V ([place])- (present on admission) Creatinine gradually improved with diuresis.  Likely his worsening renal function at his facility was congestion not 'dehydration'.  Patient continues to decline dialysis which will limit life expectancy. Good UOP. Cr 3.85>3.95 - nephrology following, appreciate recs - palliative consulted, appreciate recs  H/o Essential HTN- hypotensive since admission - Hold losartan - Continue midodrine - Continue torsemide  Goals of care- disposition is undecided from family standpoint. Insurance denied SNF. Family and patient have been presented with options for discharge. Daughter is appealing decision.  UPDATE: patient has decided to move forward with hospice care. He would like to go home as soon as possible.  Subjective: patient reports he's doing fine but really wants to go home. He states again that he would like to go home with hospice.   Physical Exam: Vitals:   11/04/21 1555 11/04/21 2105 11/05/21 0354 11/05/21 0500  BP: 102/70 95/67 103/68   Pulse: 60 64 67   Resp: 20 18 16    Temp: 98 F (36.7 C) 97.6 F (36.4 C) 97.7 F (36.5 C)   TempSrc:  Oral    SpO2: 100% 98% 96%   Weight:    70.8 kg  Height:  General: NAD, able to participate in exam Cardiac: RRR, normal heart sounds, no murmurs. Respiratory:normal effort Extremities: bilateral LE edema. WWP. Skin: warm and dry, no rashes noted on exposed skin Neuro: alert and oriented, no focal deficits Psych: flat affect and mood  Data Reviewed: Cr 4.11>4.06>3.87>3.85>3.95 GFR 13>14 Na+ 131>130>129 K+ 5.0>4.6>4.1  Family  Communication: none  Disposition: Status is: Inpatient Remains inpatient appropriate because: The patient is medically cleared for discharge to skilled nursing facility as soon as a bed is available   Planned Discharge Destination: Skilled nursing facility vs hospice  VTE Ppx: heparin injection 5,000 Units Start: 10/26/21 0830   Author: Richarda Osmond, MD 11/05/2021 6:55 AM  For on call review [email or web address].

## 2021-11-06 DIAGNOSIS — I5023 Acute on chronic systolic (congestive) heart failure: Secondary | ICD-10-CM | POA: Diagnosis not present

## 2021-11-06 LAB — CBC
HCT: 28.4 % — ABNORMAL LOW (ref 39.0–52.0)
Hemoglobin: 9 g/dL — ABNORMAL LOW (ref 13.0–17.0)
MCH: 34.4 pg — ABNORMAL HIGH (ref 26.0–34.0)
MCHC: 31.7 g/dL (ref 30.0–36.0)
MCV: 108.4 fL — ABNORMAL HIGH (ref 80.0–100.0)
Platelets: 127 10*3/uL — ABNORMAL LOW (ref 150–400)
RBC: 2.62 MIL/uL — ABNORMAL LOW (ref 4.22–5.81)
RDW: 17.1 % — ABNORMAL HIGH (ref 11.5–15.5)
WBC: 4.8 10*3/uL (ref 4.0–10.5)
nRBC: 0 % (ref 0.0–0.2)

## 2021-11-06 LAB — BASIC METABOLIC PANEL
Anion gap: 10 (ref 5–15)
BUN: 131 mg/dL — ABNORMAL HIGH (ref 8–23)
CO2: 29 mmol/L (ref 22–32)
Calcium: 8.9 mg/dL (ref 8.9–10.3)
Chloride: 89 mmol/L — ABNORMAL LOW (ref 98–111)
Creatinine, Ser: 4.12 mg/dL — ABNORMAL HIGH (ref 0.61–1.24)
GFR, Estimated: 13 mL/min — ABNORMAL LOW (ref >60)
Glucose, Bld: 98 mg/dL (ref 70–99)
Potassium: 5.4 mmol/L — ABNORMAL HIGH (ref 3.5–5.1)
Sodium: 128 mmol/L — ABNORMAL LOW (ref 135–145)

## 2021-11-06 MED ORDER — SODIUM ZIRCONIUM CYCLOSILICATE 5 G PO PACK
5.0000 g | PACK | Freq: Two times a day (BID) | ORAL | Status: AC
  Administered 2021-11-06 – 2021-11-07 (×2): 5 g via ORAL
  Filled 2021-11-06 (×2): qty 1

## 2021-11-06 MED ORDER — ASCORBIC ACID 500 MG PO TABS
500.0000 mg | ORAL_TABLET | Freq: Two times a day (BID) | ORAL | Status: DC
  Administered 2021-11-06 – 2021-11-09 (×6): 500 mg via ORAL
  Filled 2021-11-06 (×6): qty 1

## 2021-11-06 MED ORDER — ZINC SULFATE 220 (50 ZN) MG PO CAPS
220.0000 mg | ORAL_CAPSULE | Freq: Every day | ORAL | Status: DC
Start: 2021-11-06 — End: 2021-11-10
  Administered 2021-11-06 – 2021-11-09 (×4): 220 mg via ORAL
  Filled 2021-11-06 (×4): qty 1

## 2021-11-06 NOTE — Care Management Important Message (Signed)
Important Message  Patient Details  Name: [name] MRN: [id number] Date of Birth: 18-Nov-1930   Medicare Important Message Given:  Other (see comment)  Patient has decided to discharge home with Hospice. Out of respect for the patient and family no Important Message from [facility] given.  [name] 11/06/2021, 2:49 PM

## 2021-11-06 NOTE — Progress Notes (Signed)
[facility]  ROUNDING NOTE   Subjective:   [name] is a 88 year old with past medical history of hypertension, dyslipidemia, aortic aneurysm, and stage IV CKD.  Patient presents from rehab facility with fluid overloa.  With suspected to be dehydrated and prescribed IV fluids.  Patient states no shortness of breath but bilateral lower extremity and abdominal edema.  Patient has been admitted for Acute pulmonary edema (HCC) [J81.0] Acute CHF (congestive heart failure) ([place]) [I50.9] Urinary tract infection without hematuria, site unspecified [N39.0]  Patient is known to our practice and receives outpatient care from Dr. Holley Raring.    Patient seen resting in bed, withdrawn Tolerating small meals Denies shortness of breath States he will be discharged with hospice  Creatinine stable Urine output 2.6 L in past 24 hours   Objective:  Vital signs in last 24 hours:  Temp:  [97.3 F (36.3 C)-97.7 F (36.5 C)] 97.4 F (36.3 C) (02/20 0809) Pulse Rate:  [59-63] 61 (02/20 0809) Resp:  [16-18] 16 (02/20 0809) BP: (98-102)/(62-71) 102/69 (02/20 0809) SpO2:  [91 %-100 %] 100 % (02/20 0809) Weight:  [60.1 kg] 60.1 kg (02/20 0500)  Weight change: -10.7 kg Filed Weights   11/04/21 0500 11/05/21 0500 11/06/21 0500  Weight: 66.5 kg 70.8 kg 60.1 kg    Intake/Output: I/O last 3 completed shifts: In: 740 [P.O.:740] Out: 3600 [Urine:3600]   Intake/Output this shift:  Total I/O In: 240 [P.O.:240] Out: -   Physical Exam: General: NAD  Head: Normocephalic, atraumatic. Moist oral mucosal membranes  Eyes: Anicteric  Lungs:  Crackles more on left, normal effort, O2   Heart: Regular rate and rhythm  Abdomen:  Soft, nontender  Extremities:  Trace edema.  Neurologic: Nonfocal, moving all four extremities  Skin: No lesions       Basic Metabolic Panel: Recent Labs  Lab 11/01/21 0407 11/02/21 0505 11/03/21 0359 11/05/21 0532 11/06/21 0505  NA 129* 131* 130* 129*  128*  K 5.3* 5.0 4.6 4.1 5.4*  CL 95* 94* 95* 87* 89*  CO2 24 25 24 27 29   GLUCOSE 88 88 92 94 98  BUN 143* 145* 138* 131* 131*  CREATININE 4.06* 3.87* 3.85* 3.95* 4.12*  CALCIUM 8.7* 8.8* 8.7* 8.8* 8.9     Liver Function Tests: No results for input(s): AST, ALT, ALKPHOS, BILITOT, PROT, ALBUMIN in the last 168 hours.  No results for input(s): LIPASE, AMYLASE in the last 168 hours. No results for input(s): AMMONIA in the last 168 hours.  CBC: Recent Labs  Lab 11/01/21 0407 11/05/21 0532 11/06/21 0505  WBC 4.0 4.2 4.8  HGB 8.9* 8.5* 9.0*  HCT 27.7* 26.9* 28.4*  MCV 108.2* 107.6* 108.4*  PLT 143* 126* 127*     Cardiac Enzymes: No results for input(s): CKTOTAL, CKMB, CKMBINDEX, TROPONINI in the last 168 hours.  BNP: Invalid input(s): POCBNP  CBG: Recent Labs  Lab 11/05/21 2055  GLUCAP 111*    Microbiology: Results for orders placed or performed during the hospital encounter of 10/25/21  Blood culture (routine x 2)     Status: None   Collection Time: 10/25/21 11:41 PM   Specimen: BLOOD  Result Value Ref Range Status   Specimen Description BLOOD LEFT ASSIST CONTROL  Final   Special Requests   Final    BOTTLES DRAWN AEROBIC AND ANAEROBIC Blood Culture adequate volume   Culture   Final    NO GROWTH 5 DAYS Performed at [facility], [address] [place], [place]  [address]    Report Status 10/31/2021 FINAL  Final  Resp Panel by RT-PCR (Flu A&B, Covid) Nasopharyngeal Swab     Status: None   Collection Time: 10/25/21 11:41 PM   Specimen: Nasopharyngeal Swab; Nasopharyngeal(NP) swabs in vial transport medium  Result Value Ref Range Status   SARS Coronavirus 2 by RT PCR NEGATIVE NEGATIVE Final    Comment: (NOTE) SARS-CoV-2 target nucleic acids are NOT DETECTED.  The SARS-CoV-2 RNA is generally detectable in upper respiratory specimens during the acute phase of infection. The lowest concentration of SARS-CoV-2 viral copies this assay can detect  is 138 copies/mL. A negative result does not preclude SARS-Cov-2 infection and should not be used as the sole basis for treatment or other patient management decisions. A negative result may occur with  improper specimen collection/handling, submission of specimen other than nasopharyngeal swab, presence of viral mutation(s) within the areas targeted by this assay, and inadequate number of viral copies(<138 copies/mL). A negative result must be combined with clinical observations, patient history, and epidemiological information. The expected result is Negative.  Fact Sheet for Patients:  EntrepreneurPulse.com.au  Fact Sheet for Healthcare Providers:  IncredibleEmployment.be  This test is no t yet approved or cleared by the [place] FDA and  has been authorized for detection and/or diagnosis of SARS-CoV-2 by FDA under an Emergency Use Authorization (EUA). This EUA will remain  in effect (meaning this test can be used) for the duration of the COVID-19 declaration under Section 564(b)(1) of the Act, 21 U.S.C.section 360bbb-3(b)(1), unless the authorization is terminated  or revoked sooner.       Influenza A by PCR NEGATIVE NEGATIVE Final   Influenza B by PCR NEGATIVE NEGATIVE Final    Comment: (NOTE) The Xpert Xpress SARS-CoV-2/FLU/RSV plus assay is intended as an aid in the diagnosis of influenza from Nasopharyngeal swab specimens and should not be used as a sole basis for treatment. Nasal washings and aspirates are unacceptable for Xpert Xpress SARS-CoV-2/FLU/RSV testing.  Fact Sheet for Patients: EntrepreneurPulse.com.au  Fact Sheet for Healthcare Providers: IncredibleEmployment.be  This test is not yet approved or cleared by the [place] FDA and has been authorized for detection and/or diagnosis of SARS-CoV-2 by FDA under an Emergency Use Authorization (EUA). This EUA will remain in effect  (meaning this test can be used) for the duration of the COVID-19 declaration under Section 564(b)(1) of the Act, 21 U.S.C. section 360bbb-3(b)(1), unless the authorization is terminated or revoked.  Performed at [facility], [address] [place], [place] [address]   Blood culture (routine x 2)     Status: None   Collection Time: 10/25/21 11:43 PM   Specimen: BLOOD  Result Value Ref Range Status   Specimen Description BLOOD LEFT ASSIST CONTROL  Final   Special Requests   Final    BOTTLES DRAWN AEROBIC AND ANAEROBIC Blood Culture adequate volume   Culture   Final    NO GROWTH 5 DAYS Performed at [facility], [address] [place], [place] [address]    Report Status 10/31/2021 FINAL  Final  Resp Panel by RT-PCR (Flu A&B, Covid) Nasopharyngeal Swab     Status: None   Collection Time: 10/31/21  1:35 PM   Specimen: Nasopharyngeal Swab; Nasopharyngeal(NP) swabs in vial transport medium  Result Value Ref Range Status   SARS Coronavirus 2 by RT PCR NEGATIVE NEGATIVE Final    Comment: (NOTE) SARS-CoV-2 target nucleic acids are NOT DETECTED.  The SARS-CoV-2 RNA is generally detectable in upper  respiratory specimens during the acute phase of infection. The lowest concentration of SARS-CoV-2 viral copies this assay can detect is 138 copies/mL. A negative result does not preclude SARS-Cov-2 infection and should not be used as the sole basis for treatment or other patient management decisions. A negative result may occur with  improper specimen collection/handling, submission of specimen other than nasopharyngeal swab, presence of viral mutation(s) within the areas targeted by this assay, and inadequate number of viral copies(<138 copies/mL). A negative result must be combined with clinical observations, patient history, and epidemiological information. The expected result is Negative.  Fact Sheet for Patients:  EntrepreneurPulse.com.au  Fact  Sheet for Healthcare Providers:  IncredibleEmployment.be  This test is no t yet approved or cleared by the [place] FDA and  has been authorized for detection and/or diagnosis of SARS-CoV-2 by FDA under an Emergency Use Authorization (EUA). This EUA will remain  in effect (meaning this test can be used) for the duration of the COVID-19 declaration under Section 564(b)(1) of the Act, 21 U.S.C.section 360bbb-3(b)(1), unless the authorization is terminated  or revoked sooner.       Influenza A by PCR NEGATIVE NEGATIVE Final   Influenza B by PCR NEGATIVE NEGATIVE Final    Comment: (NOTE) The Xpert Xpress SARS-CoV-2/FLU/RSV plus assay is intended as an aid in the diagnosis of influenza from Nasopharyngeal swab specimens and should not be used as a sole basis for treatment. Nasal washings and aspirates are unacceptable for Xpert Xpress SARS-CoV-2/FLU/RSV testing.  Fact Sheet for Patients: EntrepreneurPulse.com.au  Fact Sheet for Healthcare Providers: IncredibleEmployment.be  This test is not yet approved or cleared by the [place] FDA and has been authorized for detection and/or diagnosis of SARS-CoV-2 by FDA under an Emergency Use Authorization (EUA). This EUA will remain in effect (meaning this test can be used) for the duration of the COVID-19 declaration under Section 564(b)(1) of the Act, 21 U.S.C. section 360bbb-3(b)(1), unless the authorization is terminated or revoked.  Performed at [facility], [address] [place], [place] [address]     Coagulation Studies: No results for input(s): LABPROT, INR in the last 72 hours.  Urinalysis: No results for input(s): COLORURINE, LABSPEC, PHURINE, GLUCOSEU, HGBUR, BILIRUBINUR, KETONESUR, PROTEINUR, UROBILINOGEN, NITRITE, LEUKOCYTESUR in the last 72 hours.  Invalid input(s): APPERANCEUR     Imaging: No results found.   Medications:       (feeding supplement) PROSource Plus  30 mL Oral BID BM   aspirin EC  81 mg Oral BID   calcitRIOL  0.25 mcg Oral Daily   guaiFENesin  600 mg Oral BID   heparin injection (subcutaneous)  5,000 Units Subcutaneous Q8H   levothyroxine  75 mcg Oral Q0600   liothyronine  5 mcg Oral Daily   metolazone  2.5 mg Oral Once per day on Mon Fri   midodrine  10 mg Oral TID WC   multivitamin with minerals  1 tablet Oral Daily   pravastatin  20 mg Oral QPM   sodium zirconium cyclosilicate  5 g Oral BID   torsemide  40 mg Oral Daily   acetaminophen **OR** acetaminophen, albuterol, hydrocerin, ondansetron **OR** ondansetron (ZOFRAN) IV, polyethylene glycol, traZODone  Assessment/ Plan:  [name] is a 88 y.o.  male with past medical history of hypertension, dyslipidemia, aortic aneurysm, and stage IV CKD.  Patient presents from rehab facility with fluid overload.  With suspected to be dehydrated and prescribed IV fluids.  Patient states no shortness of breath but bilateral  lower extremity and abdominal edema.  Patient has been admitted for Acute pulmonary edema (HCC) [J81.0] Acute CHF (congestive heart failure) (HCC) [I50.9] Urinary tract infection without hematuria, site unspecified [N39.0]   Acute Kidney Injury with hyponatremia on chronic kidney disease stage IV with baseline creatinine 3.54 and GFR of 16 on 10/14/21.  Acute kidney injury secondary to cardiorenal syndrome Chronic kidney disease secondary to hypertension and vascular disease. No IV contrast exposure. No acute indication for dialysis at this time No longer interested in dialysis due to poor prognosis.  -Continue torsemide 40 mg daily, and metalazone twice weekly -Discharge planning currently includes hospice with home health.    Lab Results  Component Value Date   CREATININE 4.12 (H) 11/06/2021   CREATININE 3.95 (H) 11/05/2021   CREATININE 3.85 (H) 11/03/2021    Intake/Output Summary (Last 24 hours) at 11/06/2021  1330 Last data filed at 11/06/2021 1029 Gross per 24 hour  Intake 780 ml  Output 1800 ml  Net -1020 ml    2.  Chronic systolic heart failure.  S/P Echo 10/03/2021 with EF less than 20% with left ventricular hypokinesis. Follow up cardiology.     LOS: [place] 2/20/20231:30 PM

## 2021-11-06 NOTE — TOC Progression Note (Signed)
Transition of Care [facility]) - Progression Note    Patient Details  Name: [name] MRN: [id number] Date of Birth: 10/30/1930  [facility]) CM/SW Contact  Eileen Stanford, LCSW Phone Number: 11/06/2021, 3:42 PM  Clinical Narrative:   Pt is still stating he is dc home with hospice. CSW has notified MD and [employer] Rep.    Expected Discharge Plan: [place] Barriers to Discharge: Continued Medical Work up  Expected Discharge Plan and Services Expected Discharge Plan: [place] arrangements for the past 2 months: [place]                                       Social Determinants of Health (SDOH) Interventions    Readmission Risk Interventions Readmission Risk Prevention Plan 05/31/2021  Transportation Screening Complete  PCP or Specialist Appt within 3-5 Days Complete  Social Work Consult for [place] Planning/Counseling Complete  Palliative Care Screening Not Applicable  Medication Review [employer]) Complete  Some recent data might be hidden

## 2021-11-06 NOTE — Progress Notes (Signed)
Manufacturing engineer [facility]) hospital liaison note:  Probation officer met again in the room with patient, his son Danielle Dess and daughter [name] at the request of [name] and hospital team.  Writer attempted to explain hospice services, but unfortunately patient's son and daughter ended up in a verbal altercation and [name] left the room. Writer was able to explain services at home, however Pam still feels that her father "deserves a chance at rehab before trying hospice". There was no real resolution for a discharge plan. He does live alone and is at this time unable to return home safely. Writer spoke via telephone to [facility] to update her regarding the meeting and lack of solution.   Hospital Liaison will be available when/if the family AND patient choose to have hospice services.  Thank you.  Flo Shanks BSN, RN, [place] [phone]

## 2021-11-06 NOTE — Progress Notes (Addendum)
Progress Note:    RAJIV PARLATO    KGU:[id number] DOB: [birth year] DOA: 10/25/2021  PCP: Baxter Hire, MD    Brief Narrative:   Mr. Skillman is an 45 yr. M with CKD V, baseline 3.9, sCHF EF <20% due to ischemic CM, AS and CAD s/p CABG and AVR 2011, pAF not on AC, PVD and recent life-threatening myxedema coma who presented from SNF with abnormal labs.   Prior to admission, patient had recently been discharged from hospital to SNF after myxedema crisis, had been at [facility] for about 10 days prior to admission, felt like he was "getting better".  Able to get out of bed, more alert.   In the last few days prior to admission he was told that he had "dehydration", so was given IV fluids at the facility.  Then sent to the ER for "dehydration".   In the ER, creatinine up to 4.4, up from 3.5 at discharge.  He had peripheral edema, BNP greater than 4500, and chest x-ray with worsened bilateral airspace opacities.  Started on Lasix and admitted.   11/06/2021: Patient continues to be medically stable and awaiting hospice at home.  Patient's son [name] on board and try to get sister on board.  Hospice nurse will contact son again this afternoon.   Subjective:   No acute events reported by nursing staff overnight.  He is alert and oriented x3.  No family present at bedside.  Currently on 3 L and saturating well.  Plan is for home hospice.  No other acute complaints.    Assessment and Plan:   Acute on chronic systolic CHF ([place])- (present on admission) appears euvolemic. - torsemide 40 daily - metolazone 2.5 twice weekly   - Consulted Nephrology, appreciate expertise   Mild hyponatremia- stable   hyperkalemia-mild.  Lokelma 5 g x 2 doses ordered - BMP am   Hypothyroidism Recently nearly died from Myxedema coma.  -Continue levothyroxine, liothyronine  Schistocytes on peripheral blood smear-  CBC has remained normal.   MAHA ruled out.  No further work up necessary.   Thrombocytopenia-  platelets 127 today. Acute phase reactant - CBC am   Anemia in other chronic diseases classified elsewhere- (present on admission) Hgb stable, no clinical bleeding   AF (paroxysmal atrial fibrillation) ([place])- (present on admission) Not on anticoagulation due to pre-existing factors. -Continue low dose aspirin   Pressure injury of buttock, stage 2 ([place])- (present on admission) - Local wound care   CKD (chronic kidney disease), stage V ([place])- (present on admission) Creatinine gradually improved with diuresis.  Likely his worsening renal function at his facility was congestive nephropathy not dehydration.  Patient continues to decline dialysis which will limit life expectancy. - nephrology following, appreciate recs - palliative consulted and the plan is for hospice   H/o Essential HTN- hypotensive since admission - Hold losartan - Continue midodrine - Continue torsemide   Goals of care- disposition is undecided from family standpoint. Insurance denied SNF. Family and patient have been presented with options for discharge. Daughter appealed decision.  UPDATE: patient has decided to move forward with hospice care. He would like to go home as soon as possible. [name], son on board.  They are trying to get sister on board. Unfortunately the patient lives alone at home therefore will need to pursue hospice house or hospice at [facility].   Other information:    DVT prophylaxis: Heparin subcu Code Status: DNR Family Communication: No family present at bedside Disposition:  Status is: Inpatient Remains inpatient appropriate awaiting final disposition with planned home hospice     Consultants:   Nephrology and palliative    Objective:    Vitals:   11/05/21 2054 11/06/21 0500 11/06/21 0504 11/06/21 0809  BP: 98/63  98/62 102/69  Pulse: 61  63 61  Resp: 18  17 16   Temp: 97.7 F (36.5 C)  (!) 97.5 F (36.4 C) (!) 97.4 F (36.3 C)  TempSrc: Oral  Oral Oral  SpO2: 91%  100% 100%   Weight:  60.1 kg    Height:        Intake/Output Summary (Last 24 hours) at 11/06/2021 1249 Last data filed at 11/06/2021 1029 Gross per 24 hour  Intake 780 ml  Output 2600 ml  Net -1820 ml   Filed Weights   11/04/21 0500 11/05/21 0500 11/06/21 0500  Weight: 66.5 kg 70.8 kg 60.1 kg       Physical Exam:    General: NAD, able to participate in exam, chronically ill-appearing Cardiac: RRR, normal heart sounds, no murmurs. Respiratory:normal effort Extremities: bilateral LE edema. WWP. Skin: warm and dry, no rashes noted on exposed skin Neuro: alert and oriented, no focal deficits Psych: flat affect and mood       Data Reviewed:    I have personally reviewed following labs and imaging studies  CBC: Recent Labs  Lab 11/01/21 0407 11/05/21 0532 11/06/21 0505  WBC 4.0 4.2 4.8  HGB 8.9* 8.5* 9.0*  HCT 27.7* 26.9* 28.4*  MCV 108.2* 107.6* 108.4*  PLT 143* 126* 127*    Basic Metabolic Panel: Recent Labs  Lab 11/01/21 0407 11/02/21 0505 11/03/21 0359 11/05/21 0532 11/06/21 0505  NA 129* 131* 130* 129* 128*  K 5.3* 5.0 4.6 4.1 5.4*  CL 95* 94* 95* 87* 89*  CO2 24 25 24 27 29   GLUCOSE 88 88 92 94 98  BUN 143* 145* 138* 131* 131*  CREATININE 4.06* 3.87* 3.85* 3.95* 4.12*  CALCIUM 8.7* 8.8* 8.7* 8.8* 8.9    GFR: Estimated Creatinine Clearance: 10.5 mL/min (A) (by C-G formula based on SCr of 4.12 mg/dL (H)).  Liver Function Tests: No results for input(s): AST, ALT, ALKPHOS, BILITOT, PROT, ALBUMIN in the last 168 hours.  CBG: Recent Labs  Lab 11/05/21 2055  GLUCAP 111*     Recent Results (from the past 240 hour(s))  Resp Panel by RT-PCR (Flu A&B, Covid) Nasopharyngeal Swab     Status: None   Collection Time: 10/31/21  1:35 PM   Specimen: Nasopharyngeal Swab; Nasopharyngeal(NP) swabs in vial transport medium  Result Value Ref Range Status   SARS Coronavirus 2 by RT PCR NEGATIVE NEGATIVE Final    Comment: (NOTE) SARS-CoV-2 target nucleic acids  are NOT DETECTED.  The SARS-CoV-2 RNA is generally detectable in upper respiratory specimens during the acute phase of infection. The lowest concentration of SARS-CoV-2 viral copies this assay can detect is 138 copies/mL. A negative result does not preclude SARS-Cov-2 infection and should not be used as the sole basis for treatment or other patient management decisions. A negative result may occur with  improper specimen collection/handling, submission of specimen other than nasopharyngeal swab, presence of viral mutation(s) within the areas targeted by this assay, and inadequate number of viral copies(<138 copies/mL). A negative result must be combined with clinical observations, patient history, and epidemiological information. The expected result is Negative.  Fact Sheet for Patients:  EntrepreneurPulse.com.au  Fact Sheet for Healthcare Providers:  IncredibleEmployment.be  This test is no t  yet approved or cleared by the [place] and  has been authorized for detection and/or diagnosis of SARS-CoV-2 by FDA under an Emergency Use Authorization (EUA). This EUA will remain  in effect (meaning this test can be used) for the duration of the COVID-19 declaration under Section 564(b)(1) of the Act, 21 U.S.C.section 360bbb-3(b)(1), unless the authorization is terminated  or revoked sooner.       Influenza A by PCR NEGATIVE NEGATIVE Final   Influenza B by PCR NEGATIVE NEGATIVE Final    Comment: (NOTE) The Xpert Xpress SARS-CoV-2/FLU/RSV plus assay is intended as an aid in the diagnosis of influenza from Nasopharyngeal swab specimens and should not be used as a sole basis for treatment. Nasal washings and aspirates are unacceptable for Xpert Xpress SARS-CoV-2/FLU/RSV testing.  Fact Sheet for Patients: EntrepreneurPulse.com.au  Fact Sheet for Healthcare Providers: IncredibleEmployment.be  This test is  not yet approved or cleared by the [place] FDA and has been authorized for detection and/or diagnosis of SARS-CoV-2 by FDA under an Emergency Use Authorization (EUA). This EUA will remain in effect (meaning this test can be used) for the duration of the COVID-19 declaration under Section 564(b)(1) of the Act, 21 U.S.C. section 360bbb-3(b)(1), unless the authorization is terminated or revoked.  Performed at [facility], [address], [place], [place] [address]          Radiology Studies:    No results found.      Medications:    Scheduled Meds:  (feeding supplement) PROSource Plus  30 mL Oral BID BM   aspirin EC  81 mg Oral BID   calcitRIOL  0.25 mcg Oral Daily   guaiFENesin  600 mg Oral BID   heparin injection (subcutaneous)  5,000 Units Subcutaneous Q8H   levothyroxine  75 mcg Oral Q0600   liothyronine  5 mcg Oral Daily   metolazone  2.5 mg Oral Once per day on Mon Fri   midodrine  10 mg Oral TID WC   multivitamin with minerals  1 tablet Oral Daily   pravastatin  20 mg Oral QPM   torsemide  40 mg Oral Daily   Continuous Infusions:     LOS: 12 days    Time spent: 35 minutes    Leslee Home, MD [facility]   To contact the attending provider between 7A-7P or the covering provider during after hours 7P-7A, please log into the web site [email or web address] and access using universal [place] password for that web site. If you do not have the password, please call the hospital operator.  11/06/2021, 12:49 PM

## 2021-11-06 NOTE — Progress Notes (Signed)
PT Cancellation Note  Patient Details Name: ABDULKARIM EBERLIN MRN: [id number] DOB: Jul 24, 1931   Cancelled Treatment:    Reason Eval/Treat Not Completed: Other (comment). Per MD, plan for dc home with hospice. Cleared for discontinue PT services. Will dc current order.   [name] 11/06/2021, 9:00 AM [name], PT, DPT, [facility] [phone]

## 2021-11-06 NOTE — Progress Notes (Signed)
Manufacturing engineer [facility]) hospital liaison note:  Telephone call placed to patient's son [name] as planned. He stated that they are still trying to get his sister on board and requested more time to "try to get a hold of her". Plan at this time is for writer to contact [place] again this afternoon.  VM message left for TOC Bridget Cobb.  Flo Shanks BSN, RN, [place] [phone]

## 2021-11-06 NOTE — Plan of Care (Addendum)
Pt alert x 2-3. Awoke this am scratching scabs and skin. Pt caused 3 skin tears. 2 to upper right back and left forearm/wrist. Areas cleansed and bandaged.  Problem: Education: Goal: Ability to demonstrate management of disease process will improve Outcome: Progressing Goal: Ability to verbalize understanding of medication therapies will improve Outcome: Progressing Goal: Individualized Educational Video(s) Outcome: Progressing   Problem: Activity: Goal: Capacity to carry out activities will improve Outcome: Progressing   Problem: Cardiac: Goal: Ability to achieve and maintain adequate cardiopulmonary perfusion will improve Outcome: Progressing   Problem: Skin Integrity: Goal: Risk for impaired skin integrity will decrease Outcome: Not Progressing

## 2021-11-06 NOTE — Progress Notes (Signed)
Initial Nutrition Assessment  DOCUMENTATION CODES:   Not applicable  INTERVENTION:   -Continue 30 ml Prosource Plus BID, each supplement provides 100 kcals and 15 grams protien -Continue MVI with minerals daily -500 mg vitamin C BID -220 mg zinc sulfate daily x 1 4 days -Liberalize diet to 2 gram sodium for widest variety of meal selections  NUTRITION DIAGNOSIS:   Increased nutrient needs related to wound healing as evidenced by estimated needs.  GOAL:   Patient will meet greater than or equal to 90% of their needs  MONITOR:   PO intake, Supplement acceptance, Labs, Weight trends, Skin, I & O's  REASON FOR ASSESSMENT:   Malnutrition Screening Tool    ASSESSMENT:   Mr. Thoma is an 62 yr. M with CKD V, baseline 3.9, sCHF EF <20% due to ischemic CM, AS and CAD s/p CABG and AVR 2011, pAF not on AC, PVD and recent life-threatening myxedema coma who presented from SNF with abnormal labs.  Pt admitted with CHF.   Reviewed I/O's: -2.1 L x 24 hours and -12.4 L since admission  UOP: 2.6 L x 24 hours  Pt unavailable at time of visit. Attempted to speak with pt via call to hospital room phone, however, unable to reach. RD unable to obtain further nutrition-related history or complete nutrition-focused physical exam at this time.    Pt familiar to this RD due to multiple prior admissions.   Pt on a heart healthy diet with 1.2 L fluid restriction. Pt with good appetite, consuming 30-100% of meals. Ardeen Fillers is taking Prosource supplements.   Reviewed wt hx; pt has experienced a 6.1% wt loss over the past 3 months, which is not significant for time frame.   Per TOC notes, plan to discharge home with hospice.   Medications reviewed and include lokelma and demadex.   Labs reviewed: Na: 128, K: 5.4, CBGS: 111.   Diet Order:   Diet Order             Diet Heart Room service appropriate? Yes; Fluid consistency: Thin; Fluid restriction: 1200 mL Fluid  Diet effective now                    EDUCATION NEEDS:   No education needs have been identified at this time  Skin:  Skin Assessment: Skin Integrity Issues: Skin Integrity Issues:: Stage II, Other (Comment), Unstageable Stage II: rt and lt buttocks Unstageable: vertebral column Other: open wound to rt and lt buttocks; multiple skin tears (rt arm, rt cervical, rt lower arm, rt upper back)  Last BM:  11/01/21  Height:   Ht Readings from Last 1 Encounters:  10/25/21 6' (1.829 m)    Weight:   Wt Readings from Last 1 Encounters:  11/06/21 60.1 kg    Ideal Body Weight:  80.9 kg  BMI:  Body mass index is 17.97 kg/m.  Estimated Nutritional Needs:   Kcal:  1850-2050  Protein:  90-105 grams  Fluid:  1.2 L    Loistine Chance, RD, LDN, [place] Registered Dietitian II Certified Diabetes Care and Education Specialist Please refer to [facility] for RD and/or RD on-call/weekend/after hours pager

## 2021-11-07 DIAGNOSIS — I5023 Acute on chronic systolic (congestive) heart failure: Secondary | ICD-10-CM | POA: Diagnosis not present

## 2021-11-07 LAB — CBC
HCT: 27.6 % — ABNORMAL LOW (ref 39.0–52.0)
Hemoglobin: 8.8 g/dL — ABNORMAL LOW (ref 13.0–17.0)
MCH: 33.8 pg (ref 26.0–34.0)
MCHC: 31.9 g/dL (ref 30.0–36.0)
MCV: 106.2 fL — ABNORMAL HIGH (ref 80.0–100.0)
Platelets: 115 10*3/uL — ABNORMAL LOW (ref 150–400)
RBC: 2.6 MIL/uL — ABNORMAL LOW (ref 4.22–5.81)
RDW: 16.5 % — ABNORMAL HIGH (ref 11.5–15.5)
WBC: 4.3 10*3/uL (ref 4.0–10.5)
nRBC: 0 % (ref 0.0–0.2)

## 2021-11-07 LAB — BASIC METABOLIC PANEL
Anion gap: 14 (ref 5–15)
BUN: 150 mg/dL — ABNORMAL HIGH (ref 8–23)
CO2: 27 mmol/L (ref 22–32)
Calcium: 8.8 mg/dL — ABNORMAL LOW (ref 8.9–10.3)
Chloride: 86 mmol/L — ABNORMAL LOW (ref 98–111)
Creatinine, Ser: 4.12 mg/dL — ABNORMAL HIGH (ref 0.61–1.24)
GFR, Estimated: 13 mL/min — ABNORMAL LOW (ref >60)
Glucose, Bld: 89 mg/dL (ref 70–99)
Potassium: 4.2 mmol/L (ref 3.5–5.1)
Sodium: 127 mmol/L — ABNORMAL LOW (ref 135–145)

## 2021-11-07 MED ORDER — ZINC SULFATE 220 (50 ZN) MG PO CAPS: 220.0000 mg | ORAL_CAPSULE | Freq: Every day | ORAL | 0 refills | Status: AC

## 2021-11-07 MED ORDER — METOLAZONE 2.5 MG PO TABS: 2.5000 mg | ORAL_TABLET | ORAL | 0 refills | Status: AC

## 2021-11-07 MED ORDER — SALINE SPRAY 0.65 % NA SOLN
1.0000 | NASAL | Status: DC | PRN
  Administered 2021-11-07: 11:00:00 1 via NASAL
  Filled 2021-11-07: qty 44

## 2021-11-07 MED ORDER — HYDROXYZINE HCL 10 MG PO TABS
5.0000 mg | ORAL_TABLET | Freq: Three times a day (TID) | ORAL | Status: DC | PRN
  Administered 2021-11-07 – 2021-11-08 (×2): 5 mg via ORAL
  Filled 2021-11-07 (×4): qty 1

## 2021-11-07 MED ORDER — SALINE SPRAY 0.65 % NA SOLN: 1.0000 | NASAL | 0 refills | Status: AC | PRN

## 2021-11-07 MED ORDER — ASCORBIC ACID 500 MG PO TABS
500.0000 mg | ORAL_TABLET | Freq: Every day | ORAL | 0 refills | Status: AC
Start: 2021-11-07 — End: ?

## 2021-11-07 MED ORDER — HYDROCERIN EX CREA: 1.0000 "application " | TOPICAL_CREAM | Freq: Two times a day (BID) | CUTANEOUS | 0 refills | Status: AC | PRN

## 2021-11-07 NOTE — Progress Notes (Signed)
Attempted to call patient's daughter, per request. No answer.

## 2021-11-07 NOTE — TOC Progression Note (Signed)
Transition of Care [facility]) - Progression Note    Patient Details  Name: XAN INGRAHAM MRN: [id number] Date of Birth: 08/21/1931  Transition of Care [facility]) CM/SW Contact  Anselm Pancoast, RN Phone Number: 11/07/2021, 3:03 PM  Clinical Narrative:    Spoke to daughter, [name] who is requesting 5 days of rehab prior to discharge from hospital. Daughter reports she is willing to take responsibility for patient and move into his home to care for him if he can get 5 more days of rehab. RN CM discussed in detail the previous denial and the concerns regarding patients medical ability to participate in therapy resulting in previous denials. Daughter verbalized understanding but reports she feels as though her father deserves a second chance at rehab since he was doing well at previous admission. RN CM discussed appeal processes and potential for hospital to discharge home with home health if insurance unable to approve the requested 5 days. Daughter reports understanding of need for urgency and potential for discharge order resulting in financial burden falling to patient.      Expected Discharge Plan: [place] Barriers to Discharge: Continued Medical Work up  Expected Discharge Plan and Services Expected Discharge Plan: [place] arrangements for the past 2 months: [place] Expected Discharge Date: 11/07/21                                     Social Determinants of Health (SDOH) Interventions    Readmission Risk Interventions Readmission Risk Prevention Plan 05/31/2021  Transportation Screening Complete  PCP or Specialist Appt within 3-5 Days Complete  Social Work Consult for [place] Planning/Counseling Complete  Palliative Care Screening Not Applicable  Medication Review [employer]) Complete  Some recent data might be hidden

## 2021-11-07 NOTE — Discharge Summary (Signed)
Physician Discharge Summary   Patient: [name] MRN: [id number] DOB: [birth year]  Admit date:     10/25/2021  Discharge date: 11/09/21  Discharge Physician: Lorella Nimrod   PCP: Baxter Hire, MD   Recommendations at discharge:  Please obtain CBC and BMP in 1 week Follow-up with your primary care doctor Follow-up with your cardiologist  Discharge Diagnoses: Principal Problem:   Acute on chronic systolic CHF (congestive heart failure) ([place]) Active Problems:   Benign essential HTN   CKD (chronic kidney disease), stage V ([place])   Pressure injury of buttock, stage 2 (H[facility])   AF (paroxysmal atrial fibrillation) ([place])   Anemia in other chronic diseases classified elsewhere   Schistocytes on peripheral blood smear   Hypothyroidism   Uremia   Goals of care, counseling/discussion   Urinary tract infection without hematuria   Acute pulmonary edema ([place])  Resolved Problems:   Acute CHF (congestive heart failure) [facility])   Hospital Course: Mr. Katzenstein is an 50 yr. M with CKD V, baseline 3.9, sCHF EF <20% due to ischemic CM, AS and CAD s/p CABG and AVR 2011, pAF not on AC, PVD and recent life-threatening myxedema coma who presented from SNF with abnormal labs.  Prior to admission, patient had recently been discharged from hospital to SNF after myxedema crisis, had been at [facility] for about 10 days prior to admission, felt like he was "getting better".  Able to get out of bed, more alert.  In the last few days prior to admission he was told that he had "dehydration", so was given IV fluids at the facility.  Then sent to the ER for "dehydration".  In the ER, creatinine up to 4.4, up from 3.5 at discharge.  He had peripheral edema, BNP greater than 4500, and chest x-ray with worsened bilateral airspace opacities.  Started on Lasix and admitted.  Patient remained medically stable for many days before discharge due to some discrepancies about disposition among different family members.  Patient  himself wants to go home with home hospice and he qualifies based on his age and life limiting comorbidities.  Apparently lives alone and family was unable to provide 24/7 care which she needs.  Insurance declined rehab.  Family was unable to refuse nursing facility with hospice.  Hospice services was consulted when he become little more lethargic with poor p.o. intake.  He did not qualify for inpatient hospice services, discharge home with hospice.  Patient will continue current level of care and need to follow-up with his providers.  Patient has a guarded prognosis, high risk for deterioration and death.  Assessment and Plan: * Acute on chronic systolic CHF (congestive heart failure) ([place])- (present on admission) Net negative 1.6L yesterday, Cr stable, BUN yesterday likely erroneous.    Completed 3 days high dose torsemide, appears euvolemic. - Transition to torsemide 40 daily - Start metolazone 2.5 twice weekly   - Consult Nephrology, appreciate expertise       Goals of care, counseling/discussion As outlined above, the patient had advanced renal and cardiac failure prior to his myxedema event, now compounded by that.  Seemed to be improving from thyroid failure, actually, and was in rehab with Cr down to 3.5, but then Cr started trending up.  Here, dialysis was discussed again, patient made clear he did not wish to proceed with this.  In light of that, Nephrology recommended medical management.  They advised patient, and I agree, that in that situation he likely has less than 6 months  life expectancy (this is also true if he were to do dialysis, in fact).  Any reduction in his diuretics will quickly result in congestion.  Likely uremia will be the limiting factor in his achieving a reasonable quality of life.  Insurance have declined to pay for acute rehab services.  Family are in the process of deciding whether to self-pay or return home.  We will support family in whatever they  choose. Hospice referral has been offered if family elect to use this resource although this is clearly at their preference.  Uremia See above  Hypothyroidism Recently nearly died from Myxedema coma.  Clearly improving from the standpoint of thyroid failure, but overall prognosis is still dire given the underlying pre-existing chronic renal and cardiac failure.  TSH improving as expected.  -Continue levothyroxine, liothyronine - If consistent with goals of care, would recommend Endocrinology follow up after discharge      Schistocytes on peripheral blood smear Incidental finding on admission.  In setting of AV graft, and without anemia or thrombocytopenia, I have low suspicion for MAHA.  CBC has remained normal.   MAHA ruled out.  No further work up necessary.  Anemia in other chronic diseases classified elsewhere- (present on admission) Hgb stable, no clinical bleeding  AF (paroxysmal atrial fibrillation) ([place])- (present on admission) Not on anticoagulation due to pre-existing factors. -Continue low dose aspirin  Pressure injury of buttock, stage 2 ([place])- (present on admission) - Local wound care  CKD (chronic kidney disease), stage V ([place])- (present on admission) Creatinine improved with diuresis.  Likely his worsening renal function at his facility was congestion not 'dehydration'.  Patient has declined dialysis prior to now and again now.  I suspect that this fluid overload can be managed with high-dose diuretics and acidosis with bicarb.  Unfortunately his BUN is up to 140s and he appears to be starting to get symptomatic with itching and sleepiness and I suspect that this will be a life limiting factor for him if he does not do dialysis. - Consult nephrology - Consult Palliative Care  Benign essential HTN- (present on admission) BP low normal - Hold losartan - Continue midodrine - Continue furosemide   Consultants: Nephrology, palliative care Procedures performed:  None Disposition: Hospice care Diet recommendation:  Discharge Diet Orders (From admission, onward)     Start     Ordered   11/07/21 0000  Diet - low sodium heart healthy        11/07/21 1446           Cardiac diet  DISCHARGE MEDICATION: Allergies as of 11/07/2021       Reactions   Morphine Anaphylaxis   Oxycodone Other (See Comments)   Hypotension and bradycardia   Carvedilol    Dizziness and syncope    Cefuroxime Axetil Swelling   Iodinated Contrast Media Rash   Other reaction(s): Asthenia (finding), Other (qualifier value) paralyzed legs Other reaction(s): RASH   Lisinopril Cough, Other (See Comments)        Medication List     STOP taking these medications    amiodarone 200 MG tablet Commonly known as: PACERONE   losartan 25 MG tablet Commonly known as: COZAAR       TAKE these medications    (feeding supplement) PROSource Plus liquid Take 30 mLs by mouth 2 (two) times daily between meals.   acetaminophen 500 MG tablet Commonly known as: TYLENOL Take 1,000 mg by mouth every 6 (six) hours as needed for mild pain.  albuterol 108 (90 Base) MCG/ACT inhaler Commonly known as: VENTOLIN HFA Inhale 1-2 puffs into the lungs every 6 (six) hours as needed for wheezing or shortness of breath.   ascorbic acid 500 MG tablet Commonly known as: VITAMIN C Take 1 tablet (500 mg total) by mouth daily.   aspirin EC 81 MG tablet Take 1 tablet (81 mg total) by mouth 2 (two) times daily. Swallow whole.   calcitRIOL 0.25 MCG capsule Commonly known as: ROCALTROL Take 0.25 mcg by mouth daily.   hydrocerin Crea Apply 1 application topically 2 (two) times daily as needed (itching).   levothyroxine 75 MCG tablet Commonly known as: SYNTHROID Take 1 tablet (75 mcg total) by mouth daily at 6 (six) AM.   liothyronine 5 MCG tablet Commonly known as: CYTOMEL Take 1 tablet (5 mcg total) by mouth daily.   metolazone 2.5 MG tablet Commonly known as: ZAROXOLYN Take 1  tablet (2.5 mg total) by mouth 2 (two) times a week. On Monday and Friday Start taking on: November 10, 2021   midodrine 10 MG tablet Commonly known as: PROAMATINE Take 1 tablet (10 mg total) by mouth 3 (three) times daily with meals.   multivitamin with minerals Tabs tablet Take 1 tablet by mouth daily.   pravastatin 20 MG tablet Commonly known as: PRAVACHOL Take 20 mg by mouth every evening.   sodium chloride 0.65 % Soln nasal spray Commonly known as: OCEAN Place 1 spray into both nostrils as needed for congestion.   Torsemide 40 MG Tabs Take 40 mg by mouth daily.   zinc sulfate 220 (50 Zn) MG capsule Take 1 capsule (220 mg total) by mouth daily. Start taking on: November 08, 2021               Discharge Care Instructions  (From admission, onward)           Start     Ordered   11/07/21 0000  Discharge wound care:       Comments: Apply gel dressing and change as needed. Frequent change in position   11/07/21 1446            Follow-up Information     Baxter Hire, MD. Schedule an appointment as soon as possible for a visit in 1 week(s).   Specialty: Internal Medicine Contact information: [place] [address] [phone]                 Discharge Exam: Danley Danker Weights   11/04/21 0500 11/05/21 0500 11/06/21 0500  Weight: 66.5 kg 70.8 kg 60.1 kg   General.  Frail and malnourished elderly man, in no acute distress. Pulmonary.  Lungs clear bilaterally, normal respiratory effort. CV.  Regular rate and rhythm, no JVD, rub or murmur. Abdomen.  Soft, nontender, nondistended, BS positive. CNS.  Alert and oriented .  No focal neurologic deficit. Extremities.  No edema, no cyanosis, pulses intact and symmetrical.  Multiple ecchymosis on bilateral upper extremities. Psychiatry.  Judgment and insight appears normal.   Condition at discharge: stable  The results of significant diagnostics from this hospitalization  (including imaging, microbiology, ancillary and laboratory) are listed below for reference.   Imaging Studies: DG Chest Port 1 View  Result Date: 10/25/2021 CLINICAL DATA:  Shortness of breath EXAM: PORTABLE CHEST 1 VIEW COMPARISON:  10/07/2021, CT 05/19/2021, radiograph 09/30/2021, 07/28/2021, 02/07/2021 FINDINGS: Post sternotomy changes. Valve prosthesis. Cardiomegaly with vascular congestion and mild interstitial edema. Small loculated right pleural effusion. New ground-glass peripheral opacity  in the left mid lung. Aortic atherosclerosis. No pneumothorax. Slight increased airspace disease at left base. IMPRESSION: 1. New peripheral airspace disease in the left mid lung with increasing airspace disease at left base suspicious for acute infectious or inflammatory process 2. Cardiomegaly with vascular congestion and probable low-grade edema. There are bilateral effusions which may be slightly loculated on the right Electronically Signed   By: Donavan Foil M.D.   On: 10/25/2021 22:53    Microbiology: Results for orders placed or performed during the hospital encounter of 10/25/21  Blood culture (routine x 2)     Status: None   Collection Time: 10/25/21 11:41 PM   Specimen: BLOOD  Result Value Ref Range Status   Specimen Description BLOOD LEFT ASSIST CONTROL  Final   Special Requests   Final    BOTTLES DRAWN AEROBIC AND ANAEROBIC Blood Culture adequate volume   Culture   Final    NO GROWTH 5 DAYS Performed at [facility], [address] [place], [place] [address]    Report Status 10/31/2021 FINAL  Final  Resp Panel by RT-PCR (Flu A&B, Covid) Nasopharyngeal Swab     Status: None   Collection Time: 10/25/21 11:41 PM   Specimen: Nasopharyngeal Swab; Nasopharyngeal(NP) swabs in vial transport medium  Result Value Ref Range Status   SARS Coronavirus 2 by RT PCR NEGATIVE NEGATIVE Final    Comment: (NOTE) SARS-CoV-2 target nucleic acids are NOT DETECTED.  The SARS-CoV-2 RNA is  generally detectable in upper respiratory specimens during the acute phase of infection. The lowest concentration of SARS-CoV-2 viral copies this assay can detect is 138 copies/mL. A negative result does not preclude SARS-Cov-2 infection and should not be used as the sole basis for treatment or other patient management decisions. A negative result may occur with  improper specimen collection/handling, submission of specimen other than nasopharyngeal swab, presence of viral mutation(s) within the areas targeted by this assay, and inadequate number of viral copies(<138 copies/mL). A negative result must be combined with clinical observations, patient history, and epidemiological information. The expected result is Negative.  Fact Sheet for Patients:  EntrepreneurPulse.com.au  Fact Sheet for Healthcare Providers:  IncredibleEmployment.be  This test is no t yet approved or cleared by the [place] FDA and  has been authorized for detection and/or diagnosis of SARS-CoV-2 by FDA under an Emergency Use Authorization (EUA). This EUA will remain  in effect (meaning this test can be used) for the duration of the COVID-19 declaration under Section 564(b)(1) of the Act, 21 U.S.C.section 360bbb-3(b)(1), unless the authorization is terminated  or revoked sooner.       Influenza A by PCR NEGATIVE NEGATIVE Final   Influenza B by PCR NEGATIVE NEGATIVE Final    Comment: (NOTE) The Xpert Xpress SARS-CoV-2/FLU/RSV plus assay is intended as an aid in the diagnosis of influenza from Nasopharyngeal swab specimens and should not be used as a sole basis for treatment. Nasal washings and aspirates are unacceptable for Xpert Xpress SARS-CoV-2/FLU/RSV testing.  Fact Sheet for Patients: EntrepreneurPulse.com.au  Fact Sheet for Healthcare Providers: IncredibleEmployment.be  This test is not yet approved or cleared by the [place] FDA and has been authorized for detection and/or diagnosis of SARS-CoV-2 by FDA under an Emergency Use Authorization (EUA). This EUA will remain in effect (meaning this test can be used) for the duration of the COVID-19 declaration under Section 564(b)(1) of the Act, 21 U.S.C. section 360bbb-3(b)(1), unless the authorization is terminated or revoked.  Performed at [facility]  Lab, [address] [place], [place] [address]   Blood culture (routine x 2)     Status: None   Collection Time: 10/25/21 11:43 PM   Specimen: BLOOD  Result Value Ref Range Status   Specimen Description BLOOD LEFT ASSIST CONTROL  Final   Special Requests   Final    BOTTLES DRAWN AEROBIC AND ANAEROBIC Blood Culture adequate volume   Culture   Final    NO GROWTH 5 DAYS Performed at [name] [address] [place], [place] [address]    Report Status 10/31/2021 FINAL  Final  Resp Panel by RT-PCR (Flu A&B, Covid) Nasopharyngeal Swab     Status: None   Collection Time: 10/31/21  1:35 PM   Specimen: Nasopharyngeal Swab; Nasopharyngeal(NP) swabs in vial transport medium  Result Value Ref Range Status   SARS Coronavirus 2 by RT PCR NEGATIVE NEGATIVE Final    Comment: (NOTE) SARS-CoV-2 target nucleic acids are NOT DETECTED.  The SARS-CoV-2 RNA is generally detectable in upper respiratory specimens during the acute phase of infection. The lowest concentration of SARS-CoV-2 viral copies this assay can detect is 138 copies/mL. A negative result does not preclude SARS-Cov-2 infection and should not be used as the sole basis for treatment or other patient management decisions. A negative result may occur with  improper specimen collection/handling, submission of specimen other than nasopharyngeal swab, presence of viral mutation(s) within the areas targeted by this assay, and inadequate number of viral copies(<138 copies/mL). A negative result must be combined with clinical observations,  patient history, and epidemiological information. The expected result is Negative.  Fact Sheet for Patients:  EntrepreneurPulse.com.au  Fact Sheet for Healthcare Providers:  IncredibleEmployment.be  This test is no t yet approved or cleared by the [place] FDA and  has been authorized for detection and/or diagnosis of SARS-CoV-2 by FDA under an Emergency Use Authorization (EUA). This EUA will remain  in effect (meaning this test can be used) for the duration of the COVID-19 declaration under Section 564(b)(1) of the Act, 21 U.S.C.section 360bbb-3(b)(1), unless the authorization is terminated  or revoked sooner.       Influenza A by PCR NEGATIVE NEGATIVE Final   Influenza B by PCR NEGATIVE NEGATIVE Final    Comment: (NOTE) The Xpert Xpress SARS-CoV-2/FLU/RSV plus assay is intended as an aid in the diagnosis of influenza from Nasopharyngeal swab specimens and should not be used as a sole basis for treatment. Nasal washings and aspirates are unacceptable for Xpert Xpress SARS-CoV-2/FLU/RSV testing.  Fact Sheet for Patients: EntrepreneurPulse.com.au  Fact Sheet for Healthcare Providers: IncredibleEmployment.be  This test is not yet approved or cleared by the [place] FDA and has been authorized for detection and/or diagnosis of SARS-CoV-2 by FDA under an Emergency Use Authorization (EUA). This EUA will remain in effect (meaning this test can be used) for the duration of the COVID-19 declaration under Section 564(b)(1) of the Act, 21 U.S.C. section 360bbb-3(b)(1), unless the authorization is terminated or revoked.  Performed at [facility], [address] [place], [place] [address]     Labs: CBC: Recent Labs  Lab 11/01/21 0407 11/05/21 0532 11/06/21 0505 11/07/21 0523  WBC 4.0 4.2 4.8 4.3  HGB 8.9* 8.5* 9.0* 8.8*  HCT 27.7* 26.9* 28.4* 27.6*  MCV 108.2* 107.6* 108.4* 106.2*   PLT 143* 126* 127* 244*   Basic Metabolic Panel: Recent Labs  Lab 11/02/21 0505 11/03/21 0359 11/05/21 0532 11/06/21 0505 11/07/21 0523  NA 131* 130* 129* 128* 127*  K 5.0 4.6 4.1 5.4* 4.2  CL 94* 95* 87* 89* 86*  CO2 25 24 27 29 27   GLUCOSE 88 92 94 98 89  BUN 145* 138* 131* 131* 150*  CREATININE 3.87* 3.85* 3.95* 4.12* 4.12*  CALCIUM 8.8* 8.7* 8.8* 8.9 8.8*   Liver Function Tests: No results for input(s): AST, ALT, ALKPHOS, BILITOT, PROT, ALBUMIN in the last 168 hours. CBG: Recent Labs  Lab 11/05/21 2055  GLUCAP 111*    Discharge time spent: greater than 30 minutes.  Signed: Lorella Nimrod, MD [facility] 11/07/2021

## 2021-11-07 NOTE — TOC Progression Note (Signed)
Transition of Care [facility]) - Progression Note    Patient Details  Name: [name] MRN: [id number] Date of Birth: 10/30/1930  Transition of Care [facility]) CM/SW Contact  Eileen Stanford, LCSW Phone Number: 11/07/2021, 3:47 PM  Clinical Narrative:   [facility] Supervisor has spoken to MD at [facility] and they are asking that [place] for auth. .CSW spoke with Tammy at [facility] has been resubmitted. Magda Paganini with [employer] if given Josem Kaufmann they can take pt.    Expected Discharge Plan: [place] Barriers to Discharge: Continued Medical Work up  Expected Discharge Plan and Services Expected Discharge Plan: [place] arrangements for the past 2 months: [place] Expected Discharge Date: 11/07/21                                     Social Determinants of Health (SDOH) Interventions    Readmission Risk Interventions Readmission Risk Prevention Plan 05/31/2021  Transportation Screening Complete  PCP or Specialist Appt within 3-5 Days Complete  Social Work Consult for [place] Planning/Counseling Complete  Palliative Care Screening Not Applicable  Medication Review Press photographer) Complete  Some recent data might be hidden

## 2021-11-07 NOTE — Progress Notes (Signed)
Progress Note:    [name]    NTI:[id number] DOB: 1931-06-25 DOA: 10/25/2021  PCP: Baxter Hire, MD    Brief Narrative:   [name] is an 67 yr. M with CKD V, baseline 3.9, sCHF EF <20% due to ischemic CM, AS and CAD s/p CABG and AVR 2011, pAF not on AC, PVD and recent life-threatening myxedema coma who presented from SNF with abnormal labs.   Prior to admission, patient had recently been discharged from hospital to SNF after myxedema crisis, had been at [facility] for about 10 days prior to admission, felt like he was "getting better".  Able to get out of bed, more alert.   In the last few days prior to admission he was told that he had "dehydration", so was given IV fluids at the facility.  Then sent to the ER for "dehydration".   In the ER, creatinine up to 4.4, up from 3.5 at discharge.  He had peripheral edema, BNP greater than 4500, and chest x-ray with worsened bilateral airspace opacities.  Started on Lasix and admitted.   11/06/2021: Patient continues to be medically stable and awaiting hospice at home.  Patient's son [name] on board and try to get sister on board.  Hospice nurse will contact son again this afternoon.  2/21.  Daughter again was against home with hospice.  After a long discussion between daughter and TOC, she agreed to try 5-day of rehab followed by home with hospice.  TOC is now working to find a place.  Patient is medically stable for discharge.   Subjective:  Patient was complaining of tailbone pain, stating that he has pressure injuries. When asked about where he want to go, stating wherever you can send me.  He seems little annoyed between family discrepancies. Earlier in the day while moving from bed to chair, he hit his head to the side of the chair, no acute injuries.  Denies any headache.  Assessment and Plan:   Acute on chronic systolic CHF ([place])- (present on admission) appears euvolemic. - torsemide 40 daily - metolazone 2.5 twice weekly    - Consulted Nephrology, appreciate expertise   Mild hyponatremia- stable    Hyperkalemia-mild.  Resolved with Lokelma -Continue to monitor   Hypothyroidism Recently nearly died from Myxedema coma.  -Continue levothyroxine, liothyronine  Schistocytes on peripheral blood smear-  CBC has remained normal.   MAHA ruled out.  No further work up necessary.   Thrombocytopenia- platelets 115 today.  -Continue to monitor   Anemia in other chronic diseases classified elsewhere- (present on admission) Hgb stable, no clinical bleeding   AF (paroxysmal atrial fibrillation) ([place])- (present on admission) Not on anticoagulation due to pre-existing factors. -Continue low dose aspirin   Pressure injury of buttock, stage 2 ([place])- (present on admission) - Local wound care   CKD (chronic kidney disease), stage V ([place])- (present on admission) Creatinine gradually improved with diuresis.  Likely his worsening renal function at his facility was congestive nephropathy not dehydration.  Patient continues to decline dialysis which will limit life expectancy. - nephrology following, appreciate recs - palliative consulted and the plan is for hospice   H/o Essential HTN- hypotensive since admission - Hold losartan - Continue midodrine - Continue torsemide   Goals of care- disposition is undecided from family standpoint. Insurance denied SNF. Family and patient have been presented with options for discharge. Daughter appealed decision.  UPDATE: patient has decided to move forward with hospice care. He would like  to go home as soon as possible. [name], son on board.  They are trying to get sister on board. Unfortunately the patient lives alone at home therefore will need to pursue hospice house or hospice at [facility].  Now daughter agrees to go home with hospice after getting at least 5 days of rehab, apparently Dr. Amalia Hailey approved.  TOC is looking for placement  Other information:    DVT prophylaxis: Heparin  subcu Code Status: DNR Family Communication: No family present at bedside Disposition:   Status is: Inpatient Remains inpatient appropriate awaiting final disposition with planned home hospice   Consultants:   Nephrology and palliative    Objective:    Vitals:   11/07/21 0436 11/07/21 0550 11/07/21 0834 11/07/21 1230  BP: (!) 145/72 95/66 96/67  101/69  Pulse: 72 65 69 61  Resp: 17 18 18 16   Temp: 97.6 F (36.4 C) 98.5 F (36.9 C) (!) 97.2 F (36.2 C) (!) 97.1 F (36.2 C)  TempSrc:      SpO2: 100% 97% 100% 100%  Weight:      Height:        Intake/Output Summary (Last 24 hours) at 11/07/2021 1546 Last data filed at 11/07/2021 1344 Gross per 24 hour  Intake 360 ml  Output 2300 ml  Net -1940 ml    Filed Weights   11/04/21 0500 11/05/21 0500 11/06/21 0500  Weight: 66.5 kg 70.8 kg 60.1 kg    Physical Exam:  General.  Frail elderly man, in no acute distress. Pulmonary.  Lungs clear bilaterally, normal respiratory effort. CV.  Regular rate and rhythm, no JVD, rub or murmur. Abdomen.  Soft, nontender, nondistended, BS positive. CNS.  Alert and oriented .  No focal neurologic deficit. Extremities.  No edema, no cyanosis, pulses intact and symmetrical.  Multiple ecchymosis on bilateral upper extremities. Psychiatry.  Judgment and insight appears normal.      Data Reviewed:    I have personally reviewed following labs and imaging studies  CBC: Recent Labs  Lab 11/01/21 0407 11/05/21 0532 11/06/21 0505 11/07/21 0523  WBC 4.0 4.2 4.8 4.3  HGB 8.9* 8.5* 9.0* 8.8*  HCT 27.7* 26.9* 28.4* 27.6*  MCV 108.2* 107.6* 108.4* 106.2*  PLT 143* 126* 127* 115*     Basic Metabolic Panel: Recent Labs  Lab 11/02/21 0505 11/03/21 0359 11/05/21 0532 11/06/21 0505 11/07/21 0523  NA 131* 130* 129* 128* 127*  K 5.0 4.6 4.1 5.4* 4.2  CL 94* 95* 87* 89* 86*  CO2 25 24 27 29 27   GLUCOSE 88 92 94 98 89  BUN 145* 138* 131* 131* 150*  CREATININE 3.87* 3.85* 3.95* 4.12*  4.12*  CALCIUM 8.8* 8.7* 8.8* 8.9 8.8*     GFR: Estimated Creatinine Clearance: 10.5 mL/min (A) (by C-G formula based on SCr of 4.12 mg/dL (H)).  Liver Function Tests: No results for input(s): AST, ALT, ALKPHOS, BILITOT, PROT, ALBUMIN in the last 168 hours.  CBG: Recent Labs  Lab 11/05/21 2055  GLUCAP 111*    Recent Results (from the past 240 hour(s))  Resp Panel by RT-PCR (Flu A&B, Covid) Nasopharyngeal Swab     Status: None   Collection Time: 10/31/21  1:35 PM   Specimen: Nasopharyngeal Swab; Nasopharyngeal(NP) swabs in vial transport medium  Result Value Ref Range Status   SARS Coronavirus 2 by RT PCR NEGATIVE NEGATIVE Final    Comment: (NOTE) SARS-CoV-2 target nucleic acids are NOT DETECTED.  The SARS-CoV-2 RNA is generally detectable in upper respiratory specimens during the  acute phase of infection. The lowest concentration of SARS-CoV-2 viral copies this assay can detect is 138 copies/mL. A negative result does not preclude SARS-Cov-2 infection and should not be used as the sole basis for treatment or other patient management decisions. A negative result may occur with  improper specimen collection/handling, submission of specimen other than nasopharyngeal swab, presence of viral mutation(s) within the areas targeted by this assay, and inadequate number of viral copies(<138 copies/mL). A negative result must be combined with clinical observations, patient history, and epidemiological information. The expected result is Negative.  Fact Sheet for Patients:  EntrepreneurPulse.com.au  Fact Sheet for Healthcare Providers:  IncredibleEmployment.be  This test is no t yet approved or cleared by the [place] FDA and  has been authorized for detection and/or diagnosis of SARS-CoV-2 by FDA under an Emergency Use Authorization (EUA). This EUA will remain  in effect (meaning this test can be used) for the duration of the COVID-19  declaration under Section 564(b)(1) of the Act, 21 U.S.C.section 360bbb-3(b)(1), unless the authorization is terminated  or revoked sooner.       Influenza A by PCR NEGATIVE NEGATIVE Final   Influenza B by PCR NEGATIVE NEGATIVE Final    Comment: (NOTE) The Xpert Xpress SARS-CoV-2/FLU/RSV plus assay is intended as an aid in the diagnosis of influenza from Nasopharyngeal swab specimens and should not be used as a sole basis for treatment. Nasal washings and aspirates are unacceptable for Xpert Xpress SARS-CoV-2/FLU/RSV testing.  Fact Sheet for Patients: EntrepreneurPulse.com.au  Fact Sheet for Healthcare Providers: IncredibleEmployment.be  This test is not yet approved or cleared by the [place] FDA and has been authorized for detection and/or diagnosis of SARS-CoV-2 by FDA under an Emergency Use Authorization (EUA). This EUA will remain in effect (meaning this test can be used) for the duration of the COVID-19 declaration under Section 564(b)(1) of the Act, 21 U.S.C. section 360bbb-3(b)(1), unless the authorization is terminated or revoked.  Performed at [facility], [address], [place], [place] [address]           Radiology Studies:    No results found.   Medications:    Scheduled Meds:  (feeding supplement) PROSource Plus  30 mL Oral BID BM   vitamin C  500 mg Oral BID   aspirin EC  81 mg Oral BID   calcitRIOL  0.25 mcg Oral Daily   guaiFENesin  600 mg Oral BID   heparin injection (subcutaneous)  5,000 Units Subcutaneous Q8H   levothyroxine  75 mcg Oral Q0600   liothyronine  5 mcg Oral Daily   metolazone  2.5 mg Oral Once per day on Mon Fri   midodrine  10 mg Oral TID WC   multivitamin with minerals  1 tablet Oral Daily   pravastatin  20 mg Oral QPM   torsemide  40 mg Oral Daily   zinc sulfate  220 mg Oral Daily   Continuous Infusions:     LOS: 13 days    Time spent: 35 minutes    Lorella Nimrod, MD [facility]  This record has been created using Systems analyst. Errors have been sought and corrected,but may not always be located. Such creation errors do not reflect on the standard of care.   To contact the attending provider between 7A-7P or the covering provider during after hours 7P-7A, please log into the web site [email or web address] and access using universal [facility] password for that web site. If you do not  have the password, please call the hospital operator.  11/07/2021, 3:46 PM

## 2021-11-07 NOTE — Progress Notes (Addendum)
[facility]  ROUNDING NOTE   Subjective:   [name] is a 88 year old with past medical history of hypertension, dyslipidemia, aortic aneurysm, and stage IV CKD.  Patient presents from rehab facility with fluid overloa.  With suspected to be dehydrated and prescribed IV fluids.  Patient states no shortness of breath but bilateral lower extremity and abdominal edema.  Patient has been admitted for Acute pulmonary edema (HCC) [J81.0] Acute CHF (congestive heart failure) ([place]) [I50.9] Urinary tract infection without hematuria, site unspecified [N39.0]  Patient is known to our practice and receives outpatient care from Dr. Holley Raring.    Patient seen sitting up in chair, alert and oriented Appears withdrawn, depressed Denies nausea and vomiting Reports mild shortness of breath, remains on 3 L [place]  Creatinine stable   Objective:  Vital signs in last 24 hours:  Temp:  [97.2 F (36.2 C)-98.5 F (36.9 C)] 97.2 F (36.2 C) (02/21 0834) Pulse Rate:  [59-72] 69 (02/21 0834) Resp:  [16-18] 18 (02/21 0834) BP: (86-145)/(62-72) 96/67 (02/21 0834) SpO2:  [97 %-100 %] 100 % (02/21 0834)  Weight change:  Filed Weights   11/04/21 0500 11/05/21 0500 11/06/21 0500  Weight: 66.5 kg 70.8 kg 60.1 kg    Intake/Output: I/O last 3 completed shifts: In: 66 [P.O.:420] Out: 4100 [Urine:4100]   Intake/Output this shift:  Total I/O In: 240 [P.O.:240] Out: -   Physical Exam: General: NAD  Head: Normocephalic, atraumatic. Moist oral mucosal membranes  Eyes: Anicteric  Lungs:  Mild Crackles, normal effort, O2   Heart: Regular rate and rhythm  Abdomen:  Soft, nontender  Extremities:  Trace edema.  Neurologic: Nonfocal, moving all four extremities  Skin: No lesions       Basic Metabolic Panel: Recent Labs  Lab 11/02/21 0505 11/03/21 0359 11/05/21 0532 11/06/21 0505 11/07/21 0523  NA 131* 130* 129* 128* 127*  K 5.0 4.6 4.1 5.4* 4.2  CL 94* 95* 87* 89* 86*  CO2 25 24  27 29 27   GLUCOSE 88 92 94 98 89  BUN 145* 138* 131* 131* 150*  CREATININE 3.87* 3.85* 3.95* 4.12* 4.12*  CALCIUM 8.8* 8.7* 8.8* 8.9 8.8*     Liver Function Tests: No results for input(s): AST, ALT, ALKPHOS, BILITOT, PROT, ALBUMIN in the last 168 hours.  No results for input(s): LIPASE, AMYLASE in the last 168 hours. No results for input(s): AMMONIA in the last 168 hours.  CBC: Recent Labs  Lab 11/01/21 0407 11/05/21 0532 11/06/21 0505 11/07/21 0523  WBC 4.0 4.2 4.8 4.3  HGB 8.9* 8.5* 9.0* 8.8*  HCT 27.7* 26.9* 28.4* 27.6*  MCV 108.2* 107.6* 108.4* 106.2*  PLT 143* 126* 127* 115*     Cardiac Enzymes: No results for input(s): CKTOTAL, CKMB, CKMBINDEX, TROPONINI in the last 168 hours.  BNP: Invalid input(s): POCBNP  CBG: Recent Labs  Lab 11/05/21 2055  GLUCAP 111*     Microbiology: Results for orders placed or performed during the hospital encounter of 10/25/21  Blood culture (routine x 2)     Status: None   Collection Time: 10/25/21 11:41 PM   Specimen: BLOOD  Result Value Ref Range Status   Specimen Description BLOOD LEFT ASSIST CONTROL  Final   Special Requests   Final    BOTTLES DRAWN AEROBIC AND ANAEROBIC Blood Culture adequate volume   Culture   Final    NO GROWTH 5 DAYS Performed at [facility], [address], [place],  [address]    Report  Status 10/31/2021 FINAL  Final  Resp Panel by RT-PCR (Flu A&B, Covid) Nasopharyngeal Swab     Status: None   Collection Time: 10/25/21 11:41 PM   Specimen: Nasopharyngeal Swab; Nasopharyngeal(NP) swabs in vial transport medium  Result Value Ref Range Status   SARS Coronavirus 2 by RT PCR NEGATIVE NEGATIVE Final    Comment: (NOTE) SARS-CoV-2 target nucleic acids are NOT DETECTED.  The SARS-CoV-2 RNA is generally detectable in upper respiratory specimens during the acute phase of infection. The lowest concentration of SARS-CoV-2 viral copies this assay can detect is 138 copies/mL. A negative  result does not preclude SARS-Cov-2 infection and should not be used as the sole basis for treatment or other patient management decisions. A negative result may occur with  improper specimen collection/handling, submission of specimen other than nasopharyngeal swab, presence of viral mutation(s) within the areas targeted by this assay, and inadequate number of viral copies(<138 copies/mL). A negative result must be combined with clinical observations, patient history, and epidemiological information. The expected result is Negative.  Fact Sheet for Patients:  EntrepreneurPulse.com.au  Fact Sheet for Healthcare Providers:  IncredibleEmployment.be  This test is no t yet approved or cleared by the [place] FDA and  has been authorized for detection and/or diagnosis of SARS-CoV-2 by FDA under an Emergency Use Authorization (EUA). This EUA will remain  in effect (meaning this test can be used) for the duration of the COVID-19 declaration under Section 564(b)(1) of the Act, 21 U.S.C.section 360bbb-3(b)(1), unless the authorization is terminated  or revoked sooner.       Influenza A by PCR NEGATIVE NEGATIVE Final   Influenza B by PCR NEGATIVE NEGATIVE Final    Comment: (NOTE) The Xpert Xpress SARS-CoV-2/FLU/RSV plus assay is intended as an aid in the diagnosis of influenza from Nasopharyngeal swab specimens and should not be used as a sole basis for treatment. Nasal washings and aspirates are unacceptable for Xpert Xpress SARS-CoV-2/FLU/RSV testing.  Fact Sheet for Patients: EntrepreneurPulse.com.au  Fact Sheet for Healthcare Providers: IncredibleEmployment.be  This test is not yet approved or cleared by the [place] FDA and has been authorized for detection and/or diagnosis of SARS-CoV-2 by FDA under an Emergency Use Authorization (EUA). This EUA will remain in effect (meaning this test can be used)  for the duration of the COVID-19 declaration under Section 564(b)(1) of the Act, 21 U.S.C. section 360bbb-3(b)(1), unless the authorization is terminated or revoked.  Performed at [facility], [address] [place], [place] [address]   Blood culture (routine x 2)     Status: None   Collection Time: 10/25/21 11:43 PM   Specimen: BLOOD  Result Value Ref Range Status   Specimen Description BLOOD LEFT ASSIST CONTROL  Final   Special Requests   Final    BOTTLES DRAWN AEROBIC AND ANAEROBIC Blood Culture adequate volume   Culture   Final    NO GROWTH 5 DAYS Performed at [facility], [address] [place], [place] [address]    Report Status 10/31/2021 FINAL  Final  Resp Panel by RT-PCR (Flu A&B, Covid) Nasopharyngeal Swab     Status: None   Collection Time: 10/31/21  1:35 PM   Specimen: Nasopharyngeal Swab; Nasopharyngeal(NP) swabs in vial transport medium  Result Value Ref Range Status   SARS Coronavirus 2 by RT PCR NEGATIVE NEGATIVE Final    Comment: (NOTE) SARS-CoV-2 target nucleic acids are NOT DETECTED.  The SARS-CoV-2 RNA is generally detectable in upper respiratory specimens during the acute  phase of infection. The lowest concentration of SARS-CoV-2 viral copies this assay can detect is 138 copies/mL. A negative result does not preclude SARS-Cov-2 infection and should not be used as the sole basis for treatment or other patient management decisions. A negative result may occur with  improper specimen collection/handling, submission of specimen other than nasopharyngeal swab, presence of viral mutation(s) within the areas targeted by this assay, and inadequate number of viral copies(<138 copies/mL). A negative result must be combined with clinical observations, patient history, and epidemiological information. The expected result is Negative.  Fact Sheet for Patients:  EntrepreneurPulse.com.au  Fact Sheet for Healthcare Providers:   IncredibleEmployment.be  This test is no t yet approved or cleared by the [place] FDA and  has been authorized for detection and/or diagnosis of SARS-CoV-2 by FDA under an Emergency Use Authorization (EUA). This EUA will remain  in effect (meaning this test can be used) for the duration of the COVID-19 declaration under Section 564(b)(1) of the Act, 21 U.S.C.section 360bbb-3(b)(1), unless the authorization is terminated  or revoked sooner.       Influenza A by PCR NEGATIVE NEGATIVE Final   Influenza B by PCR NEGATIVE NEGATIVE Final    Comment: (NOTE) The Xpert Xpress SARS-CoV-2/FLU/RSV plus assay is intended as an aid in the diagnosis of influenza from Nasopharyngeal swab specimens and should not be used as a sole basis for treatment. Nasal washings and aspirates are unacceptable for Xpert Xpress SARS-CoV-2/FLU/RSV testing.  Fact Sheet for Patients: EntrepreneurPulse.com.au  Fact Sheet for Healthcare Providers: IncredibleEmployment.be  This test is not yet approved or cleared by the [place] FDA and has been authorized for detection and/or diagnosis of SARS-CoV-2 by FDA under an Emergency Use Authorization (EUA). This EUA will remain in effect (meaning this test can be used) for the duration of the COVID-19 declaration under Section 564(b)(1) of the Act, 21 U.S.C. section 360bbb-3(b)(1), unless the authorization is terminated or revoked.  Performed at [facility], [address] [place], [place] [address]     Coagulation Studies: No results for input(s): LABPROT, INR in the last 72 hours.  Urinalysis: No results for input(s): COLORURINE, LABSPEC, PHURINE, GLUCOSEU, HGBUR, BILIRUBINUR, KETONESUR, PROTEINUR, UROBILINOGEN, NITRITE, LEUKOCYTESUR in the last 72 hours.  Invalid input(s): APPERANCEUR     Imaging: No results found.   Medications:      (feeding supplement) PROSource Plus  30  mL Oral BID BM   vitamin C  500 mg Oral BID   aspirin EC  81 mg Oral BID   calcitRIOL  0.25 mcg Oral Daily   guaiFENesin  600 mg Oral BID   heparin injection (subcutaneous)  5,000 Units Subcutaneous Q8H   levothyroxine  75 mcg Oral Q0600   liothyronine  5 mcg Oral Daily   metolazone  2.5 mg Oral Once per day on Mon Fri   midodrine  10 mg Oral TID WC   multivitamin with minerals  1 tablet Oral Daily   pravastatin  20 mg Oral QPM   torsemide  40 mg Oral Daily   zinc sulfate  220 mg Oral Daily   acetaminophen **OR** acetaminophen, albuterol, hydrocerin, hydrOXYzine, ondansetron **OR** ondansetron (ZOFRAN) IV, polyethylene glycol, sodium chloride, traZODone  Assessment/ Plan:  [name] is a 88 y.o.  male with past medical history of hypertension, dyslipidemia, aortic aneurysm, and stage IV CKD.  Patient presents from rehab facility with fluid overload.  With suspected to be dehydrated and prescribed IV fluids.  Patient states  no shortness of breath but bilateral lower extremity and abdominal edema.  Patient has been admitted for Acute pulmonary edema (HCC) [J81.0] Acute CHF (congestive heart failure) (HCC) [I50.9] Urinary tract infection without hematuria, site unspecified [N39.0]   Acute Kidney Injury with hyponatremia on chronic kidney disease stage IV with baseline creatinine 3.54 and GFR of 16 on 10/14/21.  Acute kidney injury secondary to cardiorenal syndrome Chronic kidney disease secondary to hypertension and vascular disease. No IV contrast exposure. No acute indication for dialysis at this time No longer interested in dialysis due to poor prognosis.  -We will continue torsemide 40 mg daily, and metalazone twice weekly for now -Discharge planning includes hospice with home health.  Lab Results  Component Value Date   CREATININE 4.12 (H) 11/07/2021   CREATININE 4.12 (H) 11/06/2021   CREATININE 3.95 (H) 11/05/2021    Intake/Output Summary (Last 24 hours) at  11/07/2021 1123 Last data filed at 11/07/2021 1000 Gross per 24 hour  Intake 360 ml  Output 2300 ml  Net -1940 ml    2. Anemia of chronic kidney disease Macrocytic Lab Results  Component Value Date   HGB 8.8 (L) 11/07/2021    Hemoglobin below desired target.  We will continue to monitor  3.  Chronic systolic heart failure.  S/P Echo 10/03/2021 with EF less than 20% with left ventricular hypokinesis.     LOS: [place] 2/21/202311:23 AM

## 2021-11-08 DIAGNOSIS — I5023 Acute on chronic systolic (congestive) heart failure: Secondary | ICD-10-CM | POA: Diagnosis not present

## 2021-11-08 NOTE — TOC Progression Note (Signed)
Transition of Care [facility]) - Progression Note    Patient Details  Name: [name] MRN: [id number] Date of Birth: 10/07/1930  Transition of Care [facility]) CM/SW Contact  Eileen Stanford, LCSW Phone Number: 11/08/2021, 3:49 PM  Clinical Narrative:   Now daughter is requesting home with home.  [facility]- declined [place]- declined [facility]- declined [place]- declined [facility]- declined  Horticulturist, commercial- declined Bright- declined Airline pilot    Expected Discharge Plan: [place] Barriers to Discharge: Continued Medical Work up  [employer] and Services Expected Discharge Plan: [place]       Living arrangements for the past 2 months: [place] Expected Discharge Date: 11/07/21                                     Social Determinants of Health ([place]) Interventions    Readmission Risk Interventions Readmission Risk Prevention Plan 05/31/2021  Transportation Screening Complete  PCP or Specialist Appt within 3-5 Days Complete  Social Work Consult for [place] Planning/Counseling Complete  Palliative Care Screening Not Applicable  Medication Review Press photographer) Complete  Some recent data might be hidden

## 2021-11-08 NOTE — TOC Progression Note (Signed)
Transition of Care [facility]) - Progression Note    Patient Details  Name: [name] MRN: [id number] Date of Birth: 30-Sep-1930  Transition of Care [facility]) CM/SW Contact  Anselm Pancoast, RN Phone Number: 11/08/2021, 4:15 PM  Clinical Narrative:    LVMM for daughter, [name] unable to secure [place]. Previous discussion was backup plan for Hospice evaluation to determine appropriate level of care. Will plan for discharge tomorrow with Hospice after confirming with family.    Expected Discharge Plan: [place] Barriers to Discharge: Continued Medical Work up  Expected Discharge Plan and Services Expected Discharge Plan: [place] arrangements for the past 2 months: [place] Expected Discharge Date: 11/07/21                                     Social Determinants of Health (SDOH) Interventions    Readmission Risk Interventions Readmission Risk Prevention Plan 05/31/2021  Transportation Screening Complete  PCP or Specialist Appt within 3-5 Days Complete  Social Work Consult for [place] Planning/Counseling Complete  Palliative Care Screening Not Applicable  Medication Review Press photographer) Complete  Some recent data might be hidden

## 2021-11-08 NOTE — TOC Progression Note (Signed)
Transition of Care [facility]) - Progression Note    Patient Details  Name: [name] MRN: [id number] Date of Birth: 1931-09-07  Transition of Care [facility]) CM/SW Contact  Anselm Pancoast, RN Phone Number: 11/08/2021, 3:44 PM  Clinical Narrative:    Damaris Schooner to daughter, [name] and updated that SNF had again been denied and recommendation from treatment team remains home with hospice. Pam requesting home with home health as she is prepared to assist patient at home. Understands home health may not be an option but TOC will attempt to find. If unable to secure HHC daughter is in agreeance with home with hospice. Will update daughter, [name] on outcome of [place] search.    Expected Discharge Plan: [place] Barriers to Discharge: Continued Medical Work up  Expected Discharge Plan and Services Expected Discharge Plan: [place] arrangements for the past 2 months: [place] Expected Discharge Date: 11/07/21                                     Social Determinants of Health (SDOH) Interventions    Readmission Risk Interventions Readmission Risk Prevention Plan 05/31/2021  Transportation Screening Complete  PCP or Specialist Appt within 3-5 Days Complete  Social Work Consult for [place] Planning/Counseling Complete  Palliative Care Screening Not Applicable  Medication Review Press photographer) Complete  Some recent data might be hidden

## 2021-11-08 NOTE — Progress Notes (Signed)
Progress Note:    [name]    QBH:[id number] DOB: February 28, 1931 DOA: 10/25/2021  PCP: Baxter Hire, MD    Brief Narrative:   [name] is an 5 yr. M with CKD V, baseline 3.9, sCHF EF <20% due to ischemic CM, AS and CAD s/p CABG and AVR 2011, pAF not on AC, PVD and recent life-threatening myxedema coma who presented from SNF with abnormal labs.   Prior to admission, patient had recently been discharged from hospital to SNF after myxedema crisis, had been at [facility] for about 10 days prior to admission, felt like he was "getting better".  Able to get out of bed, more alert.   In the last few days prior to admission he was told that he had "dehydration", so was given IV fluids at the facility.  Then sent to the ER for "dehydration".   In the ER, creatinine up to 4.4, up from 3.5 at discharge.  He had peripheral edema, BNP greater than 4500, and chest x-ray with worsened bilateral airspace opacities.  Started on Lasix and admitted.   11/06/2021: Patient continues to be medically stable and awaiting hospice at home.  Patient's son [name] on board and try to get sister on board.  Hospice nurse will contact son again this afternoon.  2/21.  Daughter again was against home with hospice.  After a long discussion between daughter and TOC, she agreed to try 5-day of rehab followed by home with hospice.  TOC is now working to find a place.  Patient is medically stable for discharge.  2/22.  Patient appears little more lethargic.  Poor p.o. intake.  Ongoing discussion between [facility] and family regarding disposition, now being evaluated for hospice home.  Insurance denied any type of rehab.  TOC was unable to get home health services.   Subjective:  Patient was sleeping during morning rounds.  Easily arousable but appears little lethargic.  Breakfast was sitting next to his bed, when I ask he said he is not hungry yet.  Assessment and Plan:   Acute on chronic systolic CHF ([place])- (present on  admission) appears euvolemic. - torsemide 40 daily - metolazone 2.5 twice weekly   - Consulted Nephrology, appreciate expertise   Mild hyponatremia- stable    Hyperkalemia-mild.  Resolved with [place] -Continue to monitor   Hypothyroidism Recently nearly died from Myxedema coma.  -Continue levothyroxine, liothyronine  Schistocytes on peripheral blood smear-  CBC has remained normal.   MAHA ruled out.  No further work up necessary.   Thrombocytopenia- platelets 115 today.  -Continue to monitor   Anemia in other chronic diseases classified elsewhere- (present on admission) Hgb stable, no clinical bleeding   AF (paroxysmal atrial fibrillation) ([place])- (present on admission) Not on anticoagulation due to pre-existing factors. -Continue low dose aspirin   Pressure injury of buttock, stage 2 ([place])- (present on admission) - Local wound care   CKD (chronic kidney disease), stage V ([place])- (present on admission) Creatinine gradually improved with diuresis.  Likely his worsening renal function at his facility was congestive nephropathy not dehydration.  Patient continues to decline dialysis which will limit life expectancy. - nephrology following, appreciate recs - palliative consulted and the plan is for hospice   H/o Essential HTN- hypotensive since admission - Hold losartan - Continue midodrine - Continue torsemide   Goals of care- disposition is undecided from family standpoint. Insurance denied SNF. Family and patient have been presented with options for discharge. Daughter appealed decision.  UPDATE: patient has decided to move forward with hospice care. He would like to go home as soon as possible. [name], son on board.  They are trying to get sister on board. Unfortunately the patient lives alone at home therefore will need to pursue hospice house or hospice at [facility].  Now daughter agrees to go home with hospice after getting at least 5 days of rehab, apparently Dr. Amalia Hailey  approved.  TOC is looking for placement  Insurance denied even 5 days of rehab.  Now family is looking for inpatient hospice services.  Other information:    DVT prophylaxis: Heparin subcu Code Status: DNR Family Communication: No family present at bedside, multiple back-and-forth discussion between [facility] and family. Disposition:   Status is: Inpatient Remains inpatient appropriate awaiting final disposition, most likely going to inpatient hospice tomorrow   Consultants:   Nephrology and palliative    Objective:    Vitals:   11/08/21 0810 11/08/21 0812 11/08/21 1347 11/08/21 1524  BP: 92/69 126/82 102/73 98/73  Pulse: 65 75 63 71  Resp: 17 17 20  (!) 24  Temp: 97.6 F (36.4 C)  97.6 F (36.4 C) 98.4 F (36.9 C)  TempSrc:   Oral Oral  SpO2: 100%  99% 100%  Weight:      Height:        Intake/Output Summary (Last 24 hours) at 11/08/2021 1705 Last data filed at 11/08/2021 1529 Gross per 24 hour  Intake --  Output 1600 ml  Net -1600 ml    Filed Weights   11/04/21 0500 11/05/21 0500 11/06/21 0500  Weight: 66.5 kg 70.8 kg 60.1 kg    Physical Exam:  General.  Chronically ill-appearing elderly man, in no acute distress. Pulmonary.  Lungs clear bilaterally, normal respiratory effort. CV.  Regular rate and rhythm, no JVD, rub or murmur. Abdomen.  Soft, nontender, nondistended, BS positive. CNS.  Somnolent, no apparent focal deficit Extremities.  No edema, no cyanosis, pulses intact and symmetrical. Psychiatry.  Judgment and insight appears normal.   Data Reviewed:    I have personally reviewed following labs and imaging studies  CBC: Recent Labs  Lab 11/05/21 0532 11/06/21 0505 11/07/21 0523  WBC 4.2 4.8 4.3  HGB 8.5* 9.0* 8.8*  HCT 26.9* 28.4* 27.6*  MCV 107.6* 108.4* 106.2*  PLT 126* 127* 115*    Basic Metabolic Panel: Recent Labs  Lab 11/02/21 0505 11/03/21 0359 11/05/21 0532 11/06/21 0505 11/07/21 0523  NA 131* 130* 129* 128* 127*  K 5.0  4.6 4.1 5.4* 4.2  CL 94* 95* 87* 89* 86*  CO2 25 24 27 29 27   GLUCOSE 88 92 94 98 89  BUN 145* 138* 131* 131* 150*  CREATININE 3.87* 3.85* 3.95* 4.12* 4.12*  CALCIUM 8.8* 8.7* 8.8* 8.9 8.8*     GFR: Estimated Creatinine Clearance: 10.5 mL/min (A) (by C-G formula based on SCr of 4.12 mg/dL (H)).  Liver Function Tests: No results for input(s): AST, ALT, ALKPHOS, BILITOT, PROT, ALBUMIN in the last 168 hours.  CBG: Recent Labs  Lab 11/05/21 2055  GLUCAP 111*    Recent Results (from the past 240 hour(s))  Resp Panel by RT-PCR (Flu A&B, Covid) Nasopharyngeal Swab     Status: None   Collection Time: 10/31/21  1:35 PM   Specimen: Nasopharyngeal Swab; Nasopharyngeal(NP) swabs in vial transport medium  Result Value Ref Range Status   SARS Coronavirus 2 by RT PCR NEGATIVE NEGATIVE Final    Comment: (NOTE) SARS-CoV-2 target nucleic acids are NOT DETECTED.  The SARS-CoV-2 RNA is generally detectable in upper respiratory specimens during the acute phase of infection. The lowest concentration of SARS-CoV-2 viral copies this assay can detect is 138 copies/mL. A negative result does not preclude SARS-Cov-2 infection and should not be used as the sole basis for treatment or other patient management decisions. A negative result may occur with  improper specimen collection/handling, submission of specimen other than nasopharyngeal swab, presence of viral mutation(s) within the areas targeted by this assay, and inadequate number of viral copies(<138 copies/mL). A negative result must be combined with clinical observations, patient history, and epidemiological information. The expected result is Negative.  Fact Sheet for Patients:  EntrepreneurPulse.com.au  Fact Sheet for Healthcare Providers:  IncredibleEmployment.be  This test is no t yet approved or cleared by the [place] FDA and  has been authorized for detection and/or diagnosis of  SARS-CoV-2 by FDA under an Emergency Use Authorization (EUA). This EUA will remain  in effect (meaning this test can be used) for the duration of the COVID-19 declaration under Section 564(b)(1) of the Act, 21 U.S.C.section 360bbb-3(b)(1), unless the authorization is terminated  or revoked sooner.       Influenza A by PCR NEGATIVE NEGATIVE Final   Influenza B by PCR NEGATIVE NEGATIVE Final    Comment: (NOTE) The Xpert Xpress SARS-CoV-2/FLU/RSV plus assay is intended as an aid in the diagnosis of influenza from Nasopharyngeal swab specimens and should not be used as a sole basis for treatment. Nasal washings and aspirates are unacceptable for Xpert Xpress SARS-CoV-2/FLU/RSV testing.  Fact Sheet for Patients: EntrepreneurPulse.com.au  Fact Sheet for Healthcare Providers: IncredibleEmployment.be  This test is not yet approved or cleared by the [place] FDA and has been authorized for detection and/or diagnosis of SARS-CoV-2 by FDA under an Emergency Use Authorization (EUA). This EUA will remain in effect (meaning this test can be used) for the duration of the COVID-19 declaration under Section 564(b)(1) of the Act, 21 U.S.C. section 360bbb-3(b)(1), unless the authorization is terminated or revoked.  Performed at [facility], [address] [place], [place] [address]           Radiology Studies:    No results found.   Medications:    Scheduled Meds:  (feeding supplement) PROSource Plus  30 mL Oral BID BM   vitamin C  500 mg Oral BID   aspirin EC  81 mg Oral BID   calcitRIOL  0.25 mcg Oral Daily   guaiFENesin  600 mg Oral BID   heparin injection (subcutaneous)  5,000 Units Subcutaneous Q8H   levothyroxine  75 mcg Oral Q0600   liothyronine  5 mcg Oral Daily   metolazone  2.5 mg Oral Once per day on Mon Fri   midodrine  10 mg Oral TID WC   multivitamin with minerals  1 tablet Oral Daily   pravastatin  20 mg  Oral QPM   torsemide  40 mg Oral Daily   zinc sulfate  220 mg Oral Daily   Continuous Infusions:     LOS: 14 days    Time spent: 38 minutes    Lorella Nimrod, MD [facility]  This record has been created using Systems analyst. Errors have been sought and corrected,but may not always be located. Such creation errors do not reflect on the standard of care.   To contact the attending provider between 7A-7P or the covering provider during after hours 7P-7A, please log into the web site [email or web address] and access using  universal [place] password for that web site. If you do not have the password, please call the hospital operator.  11/08/2021, 5:05 PM

## 2021-11-08 NOTE — Progress Notes (Signed)
Patients daughter would like MD to call in AM for update.

## 2021-11-08 NOTE — TOC Progression Note (Signed)
Transition of Care [facility]) - Progression Note    Patient Details  Name: NIRVAAN FRETT MRN: [id number] Date of Birth: July 12, 1931  Transition of Care [facility]) CM/SW Contact  Eileen Stanford, LCSW Phone Number: 11/08/2021, 10:14 AM  Clinical Narrative:  CSW received a call from [place] at [facility] and they have again denied the pt auth for SNF after it going to review with Dr Amalia Hailey. TOC has notified RN, Therapist, sports, and [employer].      Expected Discharge Plan: [place] Barriers to Discharge: Continued Medical Work up  Expected Discharge Plan and Services Expected Discharge Plan: [place] arrangements for the past 2 months: [place] Expected Discharge Date: 11/07/21                                     Social Determinants of Health (SDOH) Interventions    Readmission Risk Interventions Readmission Risk Prevention Plan 05/31/2021  Transportation Screening Complete  PCP or Specialist Appt within 3-5 Days Complete  Social Work Consult for [place] Planning/Counseling Complete  Palliative Care Screening Not Applicable  Medication Review Press photographer) Complete  Some recent data might be hidden

## 2021-11-08 NOTE — Progress Notes (Signed)
°   11/08/21 1524  Assess: MEWS Score  Temp 98.4 F (36.9 C)  BP 98/73  Pulse Rate 71  Resp (!) 24  SpO2 100 %  O2 Device Nasal Cannula  O2 Flow Rate (L/min) 2 L/min  Assess: MEWS Score  MEWS Temp 0  MEWS Systolic 1  MEWS Pulse 0  MEWS RR 1  MEWS LOC 0  MEWS Score 2  MEWS Score Color Yellow  Assess: if the MEWS score is Yellow or Red  Were vital signs taken at a resting state? Yes  Focused Assessment No change from prior assessment  Does the patient meet 2 or more of the SIRS criteria? No  MEWS guidelines implemented *See Row Information* Yes  Treat  MEWS Interventions Escalated (See documentation below)  Pain Scale 0-10  Pain Score 0  Take Vital Signs  Increase Vital Sign Frequency  Yellow: Q 2hr X 2 then Q 4hr X 2, if remains yellow, continue Q 4hrs  Escalate  MEWS: Escalate Yellow: discuss with charge nurse/RN and consider discussing with provider and RRT  Notify: Charge Nurse/RN  Name of Charge Nurse/RN Notified Caryl Pina RN  Date Charge Nurse/RN Notified 11/08/21  Time Charge Nurse/RN Notified 1525  Notify: Provider  Provider Name/Title Dr Reesa Chew  Date Provider Notified 11/08/21  Time Provider Notified 1350  Notification Type  (secure chat)  Notification Reason Change in status  Provider response No new orders;Other (Comment) (MD and Hospice speaking with family about discharge disposition)  Date of Provider Response 11/08/21  Time of Provider Response 1400  Document  Patient Outcome Not stable and remains on department (pt declining and family is leaning towards hospice)  Progress note created (see row info) Yes  Assess: SIRS CRITERIA  SIRS Temperature  0  SIRS Pulse 0  SIRS Respirations  1  SIRS WBC 0  SIRS Score Sum  1

## 2021-11-09 ENCOUNTER — Ambulatory Visit: Payer: PPO | Admitting: Family

## 2021-11-09 DIAGNOSIS — E440 Moderate protein-calorie malnutrition: Secondary | ICD-10-CM | POA: Insufficient documentation

## 2021-11-09 DIAGNOSIS — I5023 Acute on chronic systolic (congestive) heart failure: Secondary | ICD-10-CM | POA: Diagnosis not present

## 2021-11-09 DIAGNOSIS — E43 Unspecified severe protein-calorie malnutrition: Secondary | ICD-10-CM | POA: Insufficient documentation

## 2021-11-09 LAB — BASIC METABOLIC PANEL
Anion gap: 14 (ref 5–15)
BUN: 132 mg/dL — ABNORMAL HIGH (ref 8–23)
CO2: 30 mmol/L (ref 22–32)
Calcium: 8.6 mg/dL — ABNORMAL LOW (ref 8.9–10.3)
Chloride: 83 mmol/L — ABNORMAL LOW (ref 98–111)
Creatinine, Ser: 3.94 mg/dL — ABNORMAL HIGH (ref 0.61–1.24)
GFR, Estimated: 14 mL/min — ABNORMAL LOW (ref >60)
Glucose, Bld: 113 mg/dL — ABNORMAL HIGH (ref 70–99)
Potassium: 3.4 mmol/L — ABNORMAL LOW (ref 3.5–5.1)
Sodium: 127 mmol/L — ABNORMAL LOW (ref 135–145)

## 2021-11-09 MED ORDER — BISACODYL 10 MG RE SUPP: 10.0000 mg | Freq: Every day | RECTAL | Status: DC | PRN

## 2021-11-09 MED ORDER — POLYETHYLENE GLYCOL 3350 17 G PO PACK
17.0000 g | PACK | Freq: Every day | ORAL | Status: DC
  Administered 2021-11-09: 17 g via ORAL
  Filled 2021-11-09: qty 1

## 2021-11-09 MED ORDER — PROSOURCE PLUS PO LIQD: 30.0000 mL | Freq: Two times a day (BID) | ORAL | 2 refills | Status: DC

## 2021-11-09 MED ORDER — POLYETHYLENE GLYCOL 3350 17 G PO PACK: 17.0000 g | PACK | Freq: Every day | ORAL | 0 refills | Status: AC

## 2021-11-09 NOTE — Care Management Important Message (Signed)
Important Message  Patient Details  Name: THOMOS DOMINE MRN: [id number] Date of Birth: 18-Jan-1931   Medicare Important Message Given:  Other (see comment)  Patient is going home with Hospice. Out of respect for the patient and family no Important Message from [facility] given.   Juliann Pulse A Mutasim Tuckey 11/09/2021, 1:34 PM

## 2021-11-09 NOTE — Progress Notes (Signed)
Manufacturing engineer [facility]) Hospital Liaison Note  Received request from [facility] [place] for family interest in [place]. Visited patient at bedside and spoke with son/Frank to confirm interest and explain services. Lengthy conversation held with [name] and he states that he prefers referral is sent in for [facility] vs home services as he does not fee as though he can care for patient at home.  Approval for Hospice Home is determined by [facility] MD. Once [facility] MD has determined Hospice Home eligibility, Christie will update hospital staff and family.  Conversation later held with TOC/Bridget and TOC Supervisor/Susan discussing options for d/c if patient deemed not appropriate for IPU. Per TOC, Pam/daughter has agreed to take patient home with services to provide care in the home. DME to be ordered at a later date if needed.  Please do not hesitate to call with any hospice related questions.    Thank you for the opportunity to participate in this patient's care.  Daphene Calamity, MSW [facility] Liaison  [phone]

## 2021-11-09 NOTE — Progress Notes (Signed)
°  [facility]  ROUNDING NOTE   Subjective:   [name] is a 88 year old with past medical history of hypertension, dyslipidemia, aortic aneurysm, and stage IV CKD.  Patient presents from rehab facility with fluid overloa.  With suspected to be dehydrated and prescribed IV fluids.  Patient states no shortness of breath but bilateral lower extremity and abdominal edema.  Patient has been admitted for Acute pulmonary edema (HCC) [J81.0] Acute CHF (congestive heart failure) ([place]) [I50.9] Urinary tract infection without hematuria, site unspecified [N39.0]  Patient is known to our practice and receives outpatient care from Dr. Holley Raring.     Assessment/ Plan:  [name] is a 88 y.o.  male with past medical history of hypertension, dyslipidemia, aortic aneurysm, and stage IV CKD.  Patient presents from rehab facility with fluid overload.  With suspected to be dehydrated and prescribed IV fluids.  Patient states no shortness of breath but bilateral lower extremity and abdominal edema.  Patient has been admitted for Acute pulmonary edema (HCC) [J81.0] Acute CHF (congestive heart failure) (HCC) [I50.9] Urinary tract infection without hematuria, site unspecified [N39.0]   Acute Kidney Injury with hyponatremia on chronic kidney disease stage IV with baseline creatinine 3.54 and GFR of 16 on 10/14/21.  Acute kidney injury secondary to cardiorenal syndrome Chronic kidney disease secondary to hypertension and vascular disease. No IV contrast exposure. No acute indication for dialysis at this time No longer interested in dialysis due to poor prognosis.  -We will continue torsemide 40 mg daily, and metalazone twice weekly for now to provide respiratory support  Lab Results  Component Value Date   CREATININE 3.94 (H) 11/09/2021   CREATININE 4.12 (H) 11/07/2021   CREATININE 4.12 (H) 11/06/2021    Intake/Output Summary (Last 24 hours) at 11/09/2021 1152 Last data filed at  11/09/2021 0820 Gross per 24 hour  Intake --  Output 2500 ml  Net -2500 ml    2. Anemia of chronic kidney disease Macrocytic Lab Results  Component Value Date   HGB 8.8 (L) 11/07/2021    3.  Chronic systolic heart failure.  S/P Echo 10/03/2021 with EF less than 20% with left ventricular hypokinesis.   Due to discharge with hospice, we will sign off at this time.    LOS: [place] 2/23/202311:52 AM

## 2021-11-09 NOTE — Progress Notes (Signed)
Received phone call from patient's daughter who is adamant that patient does not receive hydroxyzine despite being educated on reasoning for patient to receive it (patient has been scratching skin until it bleeds repeatedly). Patient developed some confusion and delirium yesterday per off-going RN and daughter to which daughter fully attributes to the use of hydroxyzine. Patient however tolerated the medication without side effects on Tuesday and it controlled itching well.

## 2021-11-09 NOTE — Progress Notes (Addendum)
Manufacturing engineer [facility]) Hospital Liaison Note  Approval for Hospice Home is determined by [facility] MD. [facility] MD has determined that patient is not eligible for IPU. MSW contacted son/Frank this AM to notify him but did not receive an answer and unable to leave a VM. [place] IDT notified of above information.   ACC to continue to follow for dispo.   Addendum: MSW spoke son [name] & DIL/Donna this morning. Family is receptive to returning home with hospice and report that patient does not need any additional DME as he already has a hospital bed & O2 (2L) at home through [facility]. Family notifed that patient is approved of hospice services but is not a candidate for IPU.   TOC Supervisor/Susan has confirmed that daughter/Pam will be patients PCG one he returns home and will arrive to patients home at 4:30p. TOC has arranged transport.   Please send signed and completed DNR with patient/family upon discharge. Please provide prescriptions at discharge as needed to ensure ongoing symptom management and a transport packet.   [facility] information and contact numbers given to family and above information shared with TOC.   Please do not hesitate to call with any hospice related questions.    Thank you for the opportunity to participate in this patient's care.   Daphene Calamity, MSW [facility] Liaison  [phone]

## 2021-11-09 NOTE — TOC Transition Note (Signed)
Transition of Care [facility]) - CM/SW Discharge Note   Patient Details  Name: [name] MRN: [id number] Date of Birth: 17-Sep-1931  Transition of Care [facility]) CM/SW Contact:  Anselm Pancoast, RN Phone Number: 11/09/2021, 5:51 PM   Clinical Narrative:    Confirmed with [facility] patient is next on the list for pickup.      Barriers to Discharge: Continued Medical Work up   Patient Goals and CMS Choice Patient states their goals for this hospitalization and ongoing recovery are:: to go home CMS Medicare.gov Compare Post Acute Care list provided to:: Patient Choice offered to / list presented to : Patient  Discharge Placement                       Discharge Plan and Services                                     Social Determinants of Health (SDOH) Interventions     Readmission Risk Interventions Readmission Risk Prevention Plan 05/31/2021  Transportation Screening Complete  PCP or Specialist Appt within 3-5 Days Complete  Social Work Consult for [place] Planning/Counseling Complete  Palliative Care Screening Not Applicable  Medication Review Press photographer) Complete  Some recent data might be hidden

## 2021-11-09 NOTE — Progress Notes (Signed)
°   11/08/21 1524  Assess: MEWS Score  Temp 98.4 F (36.9 C)  BP 98/73  Pulse Rate 71  Resp (!) 24  SpO2 100 %  O2 Device Nasal Cannula  O2 Flow Rate (L/min) 2 L/min  Assess: MEWS Score  MEWS Temp 0  MEWS Systolic 1  MEWS Pulse 0  MEWS RR 1  MEWS LOC 0  MEWS Score 2  MEWS Score Color Yellow  Assess: if the MEWS score is Yellow or Red  Were vital signs taken at a resting state? Yes  Focused Assessment No change from prior assessment  Does the patient meet 2 or more of the SIRS criteria? No  MEWS guidelines implemented *See Row Information* Yes  Treat  MEWS Interventions Escalated (See documentation below)  Pain Scale 0-10  Pain Score 0  Take Vital Signs  Increase Vital Sign Frequency  Yellow: Q 2hr X 2 then Q 4hr X 2, if remains yellow, continue Q 4hrs  Escalate  MEWS: Escalate Yellow: discuss with charge nurse/RN and consider discussing with provider and RRT  Notify: Charge Nurse/RN  Name of Charge Nurse/RN Notified Caryl Pina RN  Date Charge Nurse/RN Notified 11/08/21  Time Charge Nurse/RN Notified 1525  Notify: Provider  Provider Name/Title Dr Reesa Chew  Date Provider Notified 11/08/21  Time Provider Notified 1350  Notification Type  (secure chat)  Notification Reason Change in status  Provider response No new orders;Other (Comment) (MD and Hospice speaking with family about discharge disposition)  Date of Provider Response 11/08/21  Time of Provider Response 1400  Document  Patient Outcome Not stable and remains on department (pt declining and family is leaning towards hospice)  Progress note created (see row info) Yes  Assess: SIRS CRITERIA  SIRS Temperature  0  SIRS Pulse 0  SIRS Respirations  1  SIRS WBC 0  SIRS Score Sum  1

## 2021-11-09 NOTE — Progress Notes (Addendum)
Initial Nutrition Assessment  DOCUMENTATION CODES:   Underweight, Severe malnutrition in context of chronic illness  INTERVENTION:   -Continue 30 ml Prosource Plus BID, each supplement provides 100 kcals and 15 grams protien -Continue MVI with minerals daily -Continue 500 mg vitamin C BID -Continue 220 mg zinc sulfate daily x 1 4 days -No documented BM since 11/01/21; discussed with MD and added bowel regimen  NUTRITION DIAGNOSIS:   Severe Malnutrition related to chronic illness (CHF) as evidenced by moderate fat depletion, severe fat depletion, moderate muscle depletion, severe muscle depletion.  Ongoing  GOAL:   Patient will meet greater than or equal to 90% of their needs  Progressing   MONITOR:   PO intake, Supplement acceptance, Labs, Weight trends, Skin, I & O's  REASON FOR ASSESSMENT:   Malnutrition Screening Tool    ASSESSMENT:   [name] is an 15 yr. M with CKD V, baseline 3.9, sCHF EF <20% due to ischemic CM, AS and CAD s/p CABG and AVR 2011, pAF not on AC, PVD and recent life-threatening myxedema coma who presented from SNF with abnormal labs.  Reviewed I/O's: -1.6 L x 24 hours and -17.4 L since 10/26/21  UOP: 1.6 L x 24 hours   Spoke with pt at bedside, who became more alert as interview progressed. He reports he is very sleepy, but slept well last night. RD reoriented pt to place and situation; pt confused as to why he is still in the hospital. RD explained that discharge arrangements were being made and staff was working with his family to determine discharge plan. Pt very appreciative of RD visit.   Pt with good appetite. Noted meal completions 30-100%. He is taking Prosource supplements. Noted breakfast tray untouched. RD assisted pt with tray set up and opening containers. Pt eating cream of wheat and bacon when RD exited the room.   Per TOC notes, plan to discharge home with hospice with caregivers today.   Medications reviewed.   Labs reviewed: Na:  127, CBGS: 111 (inpatient orders for glycemic control are ).    NUTRITION - FOCUSED PHYSICAL EXAM:  Flowsheet Row Most Recent Value  Orbital Region Moderate depletion  Upper Arm Region Severe depletion  Thoracic and Lumbar Region Moderate depletion  Buccal Region Moderate depletion  Temple Region Severe depletion  Clavicle Bone Region Severe depletion  Clavicle and Acromion Bone Region Severe depletion  Scapular Bone Region Severe depletion  Dorsal Hand Severe depletion  Patellar Region Severe depletion  Anterior Thigh Region Severe depletion  Posterior Calf Region Severe depletion  Edema (RD Assessment) Mild  Hair Reviewed  Eyes Reviewed  Mouth Reviewed  Skin Reviewed  Nails Reviewed       Diet Order:   Diet Order             Diet - low sodium heart healthy           Diet 2 gram sodium Room service appropriate? Yes; Fluid consistency: Thin; Fluid restriction: 1200 mL Fluid  Diet effective now                   EDUCATION NEEDS:   Not appropriate for education at this time  Skin:  Skin Assessment: Skin Integrity Issues: Skin Integrity Issues:: Stage II, Other (Comment), Unstageable Stage II: rt and lt buttocks Unstageable: vertebral column Other: open wound to rt and lt buttocks; multiple skin tears (rt arm, rt cervical, rt lower arm, rt upper back)  Last BM:  11/01/21  Height:  Ht Readings from Last 1 Encounters:  10/25/21 6' (1.829 m)    Weight:   Wt Readings from Last 1 Encounters:  11/06/21 60.1 kg    Ideal Body Weight:  80.9 kg  BMI:  Body mass index is 17.97 kg/m.  Estimated Nutritional Needs:   Kcal:  1850-2050  Protein:  90-105 grams  Fluid:  1.2 L    Loistine Chance, RD, LDN, [place] Registered Dietitian II Certified Diabetes Care and Education Specialist Please refer to [facility] for RD and/or RD on-call/weekend/after hours pager

## 2021-11-09 NOTE — TOC Progression Note (Signed)
Transition of Care [facility]) - Progression Note    Patient Details  Name: [name] MRN: [id number] Date of Birth: [birth year]  Transition of Care [facility]) CM/SW Contact  Anselm Pancoast, RN Phone Number: 11/09/2021, 10:40 AM  Clinical Narrative:    Spoke with daughter [name] who reports she had been speaking with her sister in law and gotten updates. Pam reports she had not had a chance to check her voicemail that was left yesterday from this Probation officer with update. Pam in agreeance with discharge home today with home hospice and reports family has worked out 24/7 care at the home. Also reports family is working on private caregivers to assist as needed. Confirmed patient has hospital bed and Oxygen at home. Pam will be at the home at 1630 to accept patient via EMS. Pam is requesting prescription for lotion that is being used for itching be sent with patient as she is not comfortable using the hydroxyzine for the itching as she feels it causes him to be disoriented. Discussed in detail importance of engaging with hospice staff and utilizing hospice nurse when changes occur and family has questions. Pam reports she is "hopeful they do not have to just bring patient back to ER". Reiterated importance of using resources available through Hospice services. Pam verbalized understanding. No other questions from daughter.    Expected Discharge Plan: [place] Barriers to Discharge: Continued Medical Work up  Expected Discharge Plan and Services Expected Discharge Plan: [place] arrangements for the past 2 months: [place] Expected Discharge Date: 11/07/21                                     Social Determinants of Health (SDOH) Interventions    Readmission Risk Interventions Readmission Risk Prevention Plan 05/31/2021  Transportation Screening Complete  PCP or Specialist Appt within 3-5 Days Complete  Social Work Consult for  [place] Planning/Counseling Complete  Palliative Care Screening Not Applicable  Medication Review Press photographer) Complete  Some recent data might be hidden

## 2021-11-09 NOTE — TOC Progression Note (Signed)
Transition of Care [facility]) - Progression Note    Patient Details  Name: TIMOTHEY DAHLSTROM MRN: [id number] Date of Birth: 07-Nov-1930  Transition of Care [facility]) CM/SW [place], RN Phone Number: 11/09/2021, 10:23 AM  Clinical Narrative:   [facility] supervisor spoke with daughter.  Patient will go home with hospice and care givers.  Planned discharge is 1630 today via EMS    Expected Discharge Plan: [place] Barriers to Discharge: Continued Medical Work up  Expected Discharge Plan and Services Expected Discharge Plan: [place] arrangements for the past 2 months: [place] Expected Discharge Date: 11/07/21                                     Social Determinants of Health (SDOH) Interventions    Readmission Risk Interventions Readmission Risk Prevention Plan 05/31/2021  Transportation Screening Complete  PCP or Specialist Appt within 3-5 Days Complete  Social Work Consult for [place] Planning/Counseling Complete  Palliative Care Screening Not Applicable  Medication Review Press photographer) Complete  Some recent data might be hidden

## 2021-11-12 DIAGNOSIS — I5023 Acute on chronic systolic (congestive) heart failure: Secondary | ICD-10-CM | POA: Diagnosis not present

## 2021-11-12 DIAGNOSIS — I509 Heart failure, unspecified: Secondary | ICD-10-CM | POA: Diagnosis not present

## 2021-11-14 DIAGNOSIS — Z09 Encounter for follow-up examination after completed treatment for conditions other than malignant neoplasm: Secondary | ICD-10-CM | POA: Diagnosis not present

## 2021-11-22 DIAGNOSIS — N186 End stage renal disease: Secondary | ICD-10-CM | POA: Diagnosis not present

## 2021-11-22 DIAGNOSIS — I959 Hypotension, unspecified: Secondary | ICD-10-CM | POA: Diagnosis not present

## 2021-11-22 DIAGNOSIS — I719 Aortic aneurysm of unspecified site, without rupture: Secondary | ICD-10-CM | POA: Diagnosis not present

## 2021-11-22 DIAGNOSIS — E1122 Type 2 diabetes mellitus with diabetic chronic kidney disease: Secondary | ICD-10-CM | POA: Diagnosis not present

## 2021-11-22 DIAGNOSIS — I509 Heart failure, unspecified: Secondary | ICD-10-CM | POA: Diagnosis not present

## 2021-11-22 DIAGNOSIS — E785 Hyperlipidemia, unspecified: Secondary | ICD-10-CM | POA: Diagnosis not present

## 2021-11-22 DIAGNOSIS — I132 Hypertensive heart and chronic kidney disease with heart failure and with stage 5 chronic kidney disease, or end stage renal disease: Secondary | ICD-10-CM | POA: Diagnosis not present

## 2021-11-24 ENCOUNTER — Emergency Department

## 2021-11-24 ENCOUNTER — Inpatient Hospital Stay
Admission: EM | Admit: 2021-11-24 | Discharge: 2021-12-16 | DRG: 951 | Disposition: E | Attending: Internal Medicine | Admitting: Internal Medicine

## 2021-11-24 ENCOUNTER — Encounter: Payer: Self-pay | Admitting: Family Medicine

## 2021-11-24 ENCOUNTER — Other Ambulatory Visit: Payer: Self-pay

## 2021-11-24 DIAGNOSIS — J90 Pleural effusion, not elsewhere classified: Secondary | ICD-10-CM | POA: Diagnosis not present

## 2021-11-24 DIAGNOSIS — D696 Thrombocytopenia, unspecified: Secondary | ICD-10-CM | POA: Diagnosis present

## 2021-11-24 DIAGNOSIS — I132 Hypertensive heart and chronic kidney disease with heart failure and with stage 5 chronic kidney disease, or end stage renal disease: Secondary | ICD-10-CM | POA: Diagnosis present

## 2021-11-24 DIAGNOSIS — R778 Other specified abnormalities of plasma proteins: Secondary | ICD-10-CM | POA: Diagnosis not present

## 2021-11-24 DIAGNOSIS — K921 Melena: Secondary | ICD-10-CM | POA: Diagnosis present

## 2021-11-24 DIAGNOSIS — Z881 Allergy status to other antibiotic agents status: Secondary | ICD-10-CM

## 2021-11-24 DIAGNOSIS — I255 Ischemic cardiomyopathy: Secondary | ICD-10-CM | POA: Diagnosis present

## 2021-11-24 DIAGNOSIS — J690 Pneumonitis due to inhalation of food and vomit: Secondary | ICD-10-CM | POA: Diagnosis not present

## 2021-11-24 DIAGNOSIS — K625 Hemorrhage of anus and rectum: Secondary | ICD-10-CM | POA: Diagnosis not present

## 2021-11-24 DIAGNOSIS — I21A1 Myocardial infarction type 2: Secondary | ICD-10-CM | POA: Diagnosis not present

## 2021-11-24 DIAGNOSIS — K922 Gastrointestinal hemorrhage, unspecified: Secondary | ICD-10-CM | POA: Diagnosis not present

## 2021-11-24 DIAGNOSIS — E7800 Pure hypercholesterolemia, unspecified: Secondary | ICD-10-CM | POA: Diagnosis present

## 2021-11-24 DIAGNOSIS — R000 Tachycardia, unspecified: Secondary | ICD-10-CM | POA: Diagnosis not present

## 2021-11-24 DIAGNOSIS — Z20822 Contact with and (suspected) exposure to covid-19: Secondary | ICD-10-CM | POA: Diagnosis present

## 2021-11-24 DIAGNOSIS — I959 Hypotension, unspecified: Secondary | ICD-10-CM

## 2021-11-24 DIAGNOSIS — Z681 Body mass index (BMI) 19 or less, adult: Secondary | ICD-10-CM

## 2021-11-24 DIAGNOSIS — N179 Acute kidney failure, unspecified: Secondary | ICD-10-CM | POA: Diagnosis present

## 2021-11-24 DIAGNOSIS — N185 Chronic kidney disease, stage 5: Secondary | ICD-10-CM | POA: Diagnosis not present

## 2021-11-24 DIAGNOSIS — K5730 Diverticulosis of large intestine without perforation or abscess without bleeding: Secondary | ICD-10-CM | POA: Diagnosis not present

## 2021-11-24 DIAGNOSIS — R578 Other shock: Secondary | ICD-10-CM | POA: Diagnosis present

## 2021-11-24 DIAGNOSIS — I5022 Chronic systolic (congestive) heart failure: Secondary | ICD-10-CM | POA: Diagnosis not present

## 2021-11-24 DIAGNOSIS — Z8249 Family history of ischemic heart disease and other diseases of the circulatory system: Secondary | ICD-10-CM

## 2021-11-24 DIAGNOSIS — Z952 Presence of prosthetic heart valve: Secondary | ICD-10-CM | POA: Diagnosis not present

## 2021-11-24 DIAGNOSIS — Z951 Presence of aortocoronary bypass graft: Secondary | ICD-10-CM

## 2021-11-24 DIAGNOSIS — Z87891 Personal history of nicotine dependence: Secondary | ICD-10-CM

## 2021-11-24 DIAGNOSIS — D62 Acute posthemorrhagic anemia: Secondary | ICD-10-CM | POA: Diagnosis not present

## 2021-11-24 DIAGNOSIS — I2489 Other forms of acute ischemic heart disease: Secondary | ICD-10-CM | POA: Diagnosis present

## 2021-11-24 DIAGNOSIS — N132 Hydronephrosis with renal and ureteral calculous obstruction: Secondary | ICD-10-CM | POA: Diagnosis not present

## 2021-11-24 DIAGNOSIS — K529 Noninfective gastroenteritis and colitis, unspecified: Secondary | ICD-10-CM | POA: Diagnosis not present

## 2021-11-24 DIAGNOSIS — I248 Other forms of acute ischemic heart disease: Secondary | ICD-10-CM

## 2021-11-24 DIAGNOSIS — J9811 Atelectasis: Secondary | ICD-10-CM | POA: Diagnosis not present

## 2021-11-24 DIAGNOSIS — D649 Anemia, unspecified: Secondary | ICD-10-CM | POA: Diagnosis not present

## 2021-11-24 DIAGNOSIS — Z515 Encounter for palliative care: Secondary | ICD-10-CM | POA: Diagnosis not present

## 2021-11-24 DIAGNOSIS — I2510 Atherosclerotic heart disease of native coronary artery without angina pectoris: Secondary | ICD-10-CM | POA: Diagnosis not present

## 2021-11-24 DIAGNOSIS — J9601 Acute respiratory failure with hypoxia: Secondary | ICD-10-CM | POA: Diagnosis present

## 2021-11-24 DIAGNOSIS — E871 Hypo-osmolality and hyponatremia: Secondary | ICD-10-CM | POA: Diagnosis not present

## 2021-11-24 DIAGNOSIS — Z7982 Long term (current) use of aspirin: Secondary | ICD-10-CM

## 2021-11-24 DIAGNOSIS — Z91041 Radiographic dye allergy status: Secondary | ICD-10-CM

## 2021-11-24 DIAGNOSIS — Z96651 Presence of right artificial knee joint: Secondary | ICD-10-CM | POA: Diagnosis present

## 2021-11-24 DIAGNOSIS — R64 Cachexia: Secondary | ICD-10-CM | POA: Diagnosis present

## 2021-11-24 DIAGNOSIS — E861 Hypovolemia: Secondary | ICD-10-CM | POA: Diagnosis present

## 2021-11-24 DIAGNOSIS — N400 Enlarged prostate without lower urinary tract symptoms: Secondary | ICD-10-CM | POA: Diagnosis present

## 2021-11-24 DIAGNOSIS — Z79899 Other long term (current) drug therapy: Secondary | ICD-10-CM

## 2021-11-24 DIAGNOSIS — N281 Cyst of kidney, acquired: Secondary | ICD-10-CM | POA: Diagnosis not present

## 2021-11-24 DIAGNOSIS — K219 Gastro-esophageal reflux disease without esophagitis: Secondary | ICD-10-CM | POA: Diagnosis present

## 2021-11-24 DIAGNOSIS — Z888 Allergy status to other drugs, medicaments and biological substances status: Secondary | ICD-10-CM

## 2021-11-24 DIAGNOSIS — Z885 Allergy status to narcotic agent status: Secondary | ICD-10-CM

## 2021-11-24 DIAGNOSIS — Z9861 Coronary angioplasty status: Secondary | ICD-10-CM

## 2021-11-24 DIAGNOSIS — Z66 Do not resuscitate: Secondary | ICD-10-CM | POA: Diagnosis not present

## 2021-11-24 DIAGNOSIS — E870 Hyperosmolality and hypernatremia: Secondary | ICD-10-CM | POA: Diagnosis not present

## 2021-11-24 DIAGNOSIS — I480 Paroxysmal atrial fibrillation: Secondary | ICD-10-CM | POA: Diagnosis present

## 2021-11-24 DIAGNOSIS — R58 Hemorrhage, not elsewhere classified: Secondary | ICD-10-CM | POA: Diagnosis not present

## 2021-11-24 DIAGNOSIS — E872 Acidosis, unspecified: Secondary | ICD-10-CM | POA: Diagnosis present

## 2021-11-24 DIAGNOSIS — I10 Essential (primary) hypertension: Secondary | ICD-10-CM | POA: Diagnosis present

## 2021-11-24 DIAGNOSIS — Z79890 Hormone replacement therapy (postmenopausal): Secondary | ICD-10-CM

## 2021-11-24 DIAGNOSIS — K5641 Fecal impaction: Secondary | ICD-10-CM | POA: Diagnosis present

## 2021-11-24 LAB — COMPREHENSIVE METABOLIC PANEL
ALT: 24 U/L (ref 0–44)
AST: 47 U/L — ABNORMAL HIGH (ref 15–41)
Albumin: 2.9 g/dL — ABNORMAL LOW (ref 3.5–5.0)
Alkaline Phosphatase: 72 U/L (ref 38–126)
Anion gap: 22 — ABNORMAL HIGH (ref 5–15)
BUN: 190 mg/dL — ABNORMAL HIGH (ref 8–23)
CO2: 28 mmol/L (ref 22–32)
Calcium: 7.7 mg/dL — ABNORMAL LOW (ref 8.9–10.3)
Chloride: 79 mmol/L — ABNORMAL LOW (ref 98–111)
Creatinine, Ser: 5.55 mg/dL — ABNORMAL HIGH (ref 0.61–1.24)
GFR, Estimated: 9 mL/min — ABNORMAL LOW (ref >60)
Glucose, Bld: 125 mg/dL — ABNORMAL HIGH (ref 70–99)
Potassium: 4.5 mmol/L (ref 3.5–5.1)
Sodium: 129 mmol/L — ABNORMAL LOW (ref 135–145)
Total Bilirubin: 1.5 mg/dL — ABNORMAL HIGH (ref 0.3–1.2)
Total Protein: 6.2 g/dL — ABNORMAL LOW (ref 6.5–8.1)

## 2021-11-24 LAB — CBC WITH DIFFERENTIAL/PLATELET
Abs Immature Granulocytes: 0.08 10*3/uL — ABNORMAL HIGH (ref 0.00–0.07)
Basophils Absolute: 0 10*3/uL (ref 0.0–0.1)
Basophils Relative: 0 %
Eosinophils Absolute: 0 10*3/uL (ref 0.0–0.5)
Eosinophils Relative: 0 %
HCT: 28 % — ABNORMAL LOW (ref 39.0–52.0)
Hemoglobin: 8.9 g/dL — ABNORMAL LOW (ref 13.0–17.0)
Immature Granulocytes: 1 %
Lymphocytes Relative: 5 %
Lymphs Abs: 0.5 10*3/uL — ABNORMAL LOW (ref 0.7–4.0)
MCH: 33.3 pg (ref 26.0–34.0)
MCHC: 31.8 g/dL (ref 30.0–36.0)
MCV: 104.9 fL — ABNORMAL HIGH (ref 80.0–100.0)
Monocytes Absolute: 0.5 10*3/uL (ref 0.1–1.0)
Monocytes Relative: 4 %
Neutro Abs: 10.2 10*3/uL — ABNORMAL HIGH (ref 1.7–7.7)
Neutrophils Relative %: 90 %
Platelets: 98 10*3/uL — ABNORMAL LOW (ref 150–400)
RBC: 2.67 MIL/uL — ABNORMAL LOW (ref 4.22–5.81)
RDW: 14.3 % (ref 11.5–15.5)
WBC: 11.3 10*3/uL — ABNORMAL HIGH (ref 4.0–10.5)
nRBC: 0 % (ref 0.0–0.2)

## 2021-11-24 LAB — LIPASE, BLOOD: Lipase: 102 U/L — ABNORMAL HIGH (ref 11–51)

## 2021-11-24 LAB — RESP PANEL BY RT-PCR (FLU A&B, COVID) ARPGX2
Influenza A by PCR: NEGATIVE
Influenza B by PCR: NEGATIVE
SARS Coronavirus 2 by RT PCR: NEGATIVE

## 2021-11-24 LAB — LACTIC ACID, PLASMA: Lactic Acid, Venous: 2.3 mmol/L (ref 0.5–1.9)

## 2021-11-24 LAB — TROPONIN I (HIGH SENSITIVITY): Troponin I (High Sensitivity): 907 ng/L (ref <18)

## 2021-11-24 LAB — PROTIME-INR
INR: 1.3 — ABNORMAL HIGH (ref 0.8–1.2)
Prothrombin Time: 15.8 seconds — ABNORMAL HIGH (ref 11.4–15.2)

## 2021-11-24 MED ORDER — OCTREOTIDE ACETATE 100 MCG/ML IJ SOLN: 100.0000 ug | Freq: Three times a day (TID) | INTRAMUSCULAR | Status: DC | PRN

## 2021-11-24 MED ORDER — PROCHLORPERAZINE EDISYLATE 10 MG/2ML IJ SOLN
10.0000 mg | Freq: Two times a day (BID) | INTRAMUSCULAR | Status: DC | PRN
  Filled 2021-11-24: qty 2

## 2021-11-24 MED ORDER — LACTATED RINGERS IV BOLUS (SEPSIS): 1000.0000 mL | Freq: Once | INTRAVENOUS | Status: DC

## 2021-11-24 MED ORDER — LORAZEPAM 2 MG/ML IJ SOLN
0.5000 mg | Freq: Once | INTRAMUSCULAR | Status: AC
  Administered 2021-11-24: 0.5 mg via INTRAVENOUS
  Filled 2021-11-24: qty 1

## 2021-11-24 MED ORDER — GLYCOPYRROLATE 1 MG PO TABS
1.0000 mg | ORAL_TABLET | ORAL | Status: DC | PRN
Start: 2021-11-24 — End: 2021-11-27
  Filled 2021-11-24: qty 1

## 2021-11-24 MED ORDER — PROCHLORPERAZINE MALEATE 5 MG PO TABS
5.0000 mg | ORAL_TABLET | Freq: Four times a day (QID) | ORAL | Status: DC | PRN
  Filled 2021-11-24: qty 1

## 2021-11-24 MED ORDER — CIPROFLOXACIN IN D5W 400 MG/200ML IV SOLN
400.0000 mg | Freq: Once | INTRAVENOUS | Status: DC
  Filled 2021-11-24: qty 200

## 2021-11-24 MED ORDER — PROCHLORPERAZINE 25 MG RE SUPP
25.0000 mg | Freq: Two times a day (BID) | RECTAL | Status: DC | PRN
  Filled 2021-11-24: qty 1

## 2021-11-24 MED ORDER — LORAZEPAM 1 MG PO TABS: 1.0000 mg | ORAL_TABLET | ORAL | Status: DC | PRN

## 2021-11-24 MED ORDER — GLYCOPYRROLATE 0.2 MG/ML IJ SOLN
0.2000 mg | INTRAMUSCULAR | Status: DC | PRN
  Filled 2021-11-24: qty 1

## 2021-11-24 MED ORDER — FENTANYL CITRATE PF 50 MCG/ML IJ SOSY
25.0000 ug | PREFILLED_SYRINGE | INTRAMUSCULAR | Status: DC | PRN
  Administered 2021-11-25: 25 ug via INTRAVENOUS
  Filled 2021-11-24: qty 1

## 2021-11-24 MED ORDER — ORAL CARE MOUTH RINSE
15.0000 mL | Freq: Two times a day (BID) | OROMUCOSAL | Status: DC
  Administered 2021-11-24 – 2021-11-26 (×5): 15 mL via OROMUCOSAL

## 2021-11-24 MED ORDER — LORAZEPAM 2 MG/ML PO CONC: 1.0000 mg | ORAL | Status: DC | PRN

## 2021-11-24 MED ORDER — POLYVINYL ALCOHOL 1.4 % OP SOLN
1.0000 [drp] | Freq: Four times a day (QID) | OPHTHALMIC | Status: DC | PRN
  Filled 2021-11-24: qty 15

## 2021-11-24 MED ORDER — BIOTENE DRY MOUTH MT LIQD
15.0000 mL | OROMUCOSAL | Status: DC | PRN
  Filled 2021-11-24: qty 15

## 2021-11-24 MED ORDER — LORAZEPAM 2 MG/ML IJ SOLN
1.0000 mg | INTRAMUSCULAR | Status: DC | PRN
  Administered 2021-11-24: 1 mg via INTRAVENOUS
  Filled 2021-11-24: qty 1

## 2021-11-24 MED ORDER — SODIUM CHLORIDE 0.9 % IV SOLN
INTRAVENOUS | Status: AC
Start: 2021-11-24 — End: 2021-11-24

## 2021-11-24 MED ORDER — METRONIDAZOLE 500 MG/100ML IV SOLN
500.0000 mg | Freq: Once | INTRAVENOUS | Status: AC
  Administered 2021-11-24: 500 mg via INTRAVENOUS
  Filled 2021-11-24: qty 100

## 2021-11-24 MED ORDER — LACTATED RINGERS IV BOLUS
1000.0000 mL | Freq: Once | INTRAVENOUS | Status: AC
  Administered 2021-11-24: 1000 mL via INTRAVENOUS

## 2021-11-24 MED ORDER — SODIUM CHLORIDE 0.9 % IV SOLN
2.0000 g | Freq: Once | INTRAVENOUS | Status: AC
  Administered 2021-11-24: 2 g via INTRAVENOUS
  Filled 2021-11-24: qty 2

## 2021-11-24 MED ORDER — CHLORHEXIDINE GLUCONATE 0.12 % MT SOLN
15.0000 mL | Freq: Two times a day (BID) | OROMUCOSAL | Status: DC
  Administered 2021-11-24 – 2021-11-26 (×5): 15 mL via OROMUCOSAL
  Filled 2021-11-24 (×4): qty 15

## 2021-11-24 NOTE — ED Triage Notes (Signed)
Arrived via EMS. Patient is DNR, was found by hospice nurse sitting in a pool of blood. Resp even unlabored on RA. Responds to verbal stimuli. Heavy rectal bleeding noted. 20 G L FA placed by EMS. CBG 165.  ?

## 2021-11-24 NOTE — ED Notes (Signed)
Returned to ED from CT.  ?

## 2021-11-24 NOTE — ED Notes (Signed)
Patient noted to have periods of apnea followed by rapid shallow respirations. Grimacing and states he is in pain.  ?

## 2021-11-24 NOTE — ED Notes (Signed)
Patient with heavy rectal bleeding. Linens changed, pericare performed. Patient then strained and had a second large bloody bowel movement. Linens changed, pericare performed and clean brief applied. Positioned for comfort.  ?

## 2021-11-24 NOTE — Progress Notes (Signed)
Sent Dr. Arbutus Ped secure chat to let her know that patient's BP is 129/114 and that O2 saturation nor temperature will register. Blankets in place on patient and 2L nasal cannula on. MD acknowledged and stated no need to treat.  ?

## 2021-11-24 NOTE — Hospital Course (Addendum)
[name] is a pleasant 88 y.o. male with medical history significant for CHF with EF less than 20%, CKD 5 not on dialysis, anemia and thrombocytopenia, CAD status post CABG, A-fib not anticoagulated, and recent life-threatening myxedema coma who presents with rectal bleeding and is poorly responsive. Patient was recently readmitted to the hospital with acute on chronic systolic CHF complicated by worsening renal function and uremia, indicated that he would not want dialysis, and was discharged on hospice on 11/09/2021.  Hospice nurse visited the patient last night and found him to be poorly responsive and in a pool of blood.  He appeared in pain and uncomfortable.  In the ED, pt was having rectal bleeding and hypotension. ? ?Admitted to the hospital for pain control and other comfort care measures. ? ?On 3/11 patient's BP was noted 49/38.  Patient remained unresponsive but later in the day did have increased work of breathing and noted to appear quite uncomfortable at times.  Family was in agreement patient was started on fentanyl infusion. ? ?Patient remained on comfort measures and subsequently passed away on 24-Dec-2022 at 1634. ?

## 2021-11-24 NOTE — H&P (Signed)
History and Physical    JAIDEV SANGER FIE:[id number] DOB: Aug 27, 1931 DOA: 12/13/2021  PCP: Baxter Hire, MD   Patient coming from: Hospice   Chief Complaint: Poorly responsive, rectal bleeding   HPI: [name] is a pleasant 88 y.o. male with medical history significant for CHF with EF less than 20%, CKD 5 not on dialysis, anemia and thrombocytopenia, CAD status post CABG, A-fib not anticoagulated, and recent life-threatening myxedema coma who presents with rectal bleeding and is poorly responsive.  Patient was recently readmitted to the hospital with acute on chronic systolic CHF complicated by worsening renal function and uremia, indicated that he would not want dialysis, and was discharged on hospice on 11/09/2021.  Hospice nurse visited the patient last night and found him to be poorly responsive and in a pool of blood.  He was brought into the emergency department where he has been unable to provide much history but was noted to be grimacing and has had continued large-volume rectal bleeding.  His son is at the bedside.  Patient's son indicates that the patient has not wanted any aggressive treatments and at this point would want [name] to focus on keeping him comfortable.  Patient's family does not believe that he would want a blood transfusion or further lab draws.  They agree with admitting the patient to the hospital to control his apparent pain and discomfort.  ED Course: Upon arrival to the ED, patient is found to be poorly responsive and hemorrhaging from his rectum.  He became severely hypotensive in the ED and has been grimacing.  ED work-up reveals worsening renal function, significant troponin elevation, CT with fecal impaction and associated colitis, and likely aspiration with hypoxic respiratory failure.  He was given IV fluids, antibiotics, and Ativan.  He continued to have large volume rectal bleeding with hypotension family indicates that they would like to honor the patient's  wishes and believe he would want [name] to just focus on keeping him comfortable at this point.  Review of Systems:  All other systems reviewed and apart from HPI, are negative.  Past Medical History:  Diagnosis Date   Anemia    Aortic aneurysm (HCC)    Aortic aneurysm (HCC)    Arthritis    CHF (congestive heart failure) (HCC)    CKD (chronic kidney disease)    ALSO LEFT RENAL MASS   Coronary artery disease    Diverticulosis    History of perforated diverticulitis   GERD (gastroesophageal reflux disease)    Hip fracture ([place]) 05/11/2020   History of hiatal hernia    Hypercholesteremia    Hypertension    MGUS (monoclonal gammopathy of unknown significance)    MGUS (monoclonal gammopathy of unknown significance)    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[place]) 10/06/2021   Periprosthetic fracture around internal prosthetic hip joint 08/05/2020   Pressure injury of skin 08/07/2020    Past Surgical History:  Procedure Laterality Date   AORTIC VALVE REPLACEMENT     bipass     CARDIAC VALVE REPLACEMENT     pig valve   CHOLECYSTECTOMY     Open   COLON SURGERY     History of Hartman's procedure with subsequent colostomy reversal   CORONARY ANGIOPLASTY     CORONARY ARTERY BYPASS GRAFT     ESOPHAGOGASTRODUODENOSCOPY (EGD) WITH PROPOFOL N/A 02/07/2015   Procedure: ESOPHAGOGASTRODUODENOSCOPY (EGD) WITH PROPOFOL;  Surgeon: Hulen Luster, MD;  Location: [facility] END[facility]COPY;  Service: Gastroenterology;  Laterality: N/A;   EYE SURGERY  HERNIA REPAIR     INTRAMEDULLARY (IM) NAIL INTERTROCHANTERIC Right 05/12/2020   Procedure: INTRAMEDULLARY (IM) NAIL INTERTROCHANTRIC;  Surgeon: Lovell Sheehan, MD;  Location: [facility] ORS;  Service: Orthopedics;  Laterality: Right;   JOINT REPLACEMENT     left knee x4   KNEE ARTHROPLASTY Right 04/02/2016   Procedure: COMPUTER ASSISTED TOTAL KNEE ARTHROPLASTY;  Surgeon: Dereck Leep, MD;  Location: [facility] ORS;  Service: Orthopedics;  Laterality: Right;   ORIF FEMUR FRACTURE Right 08/08/2020    Procedure: OPEN REDUCTION INTERNAL FIXATION (ORIF) DISTAL FEMUR FRACTURE;  Surgeon: Lovell Sheehan, MD;  Location: [facility] ORS;  Service: Orthopedics;  Laterality: Right;    Social History:   reports that he has quit smoking. He has never used smokeless tobacco. He reports that he does not drink alcohol and does not use drugs.  Allergies  Allergen Reactions   Morphine Anaphylaxis   Oxycodone Other (See Comments)    Hypotension and bradycardia   Carvedilol     Dizziness and syncope    Cefuroxime Axetil Swelling   Iodinated Contrast Media Rash    Other reaction(s): Asthenia (finding), Other (qualifier value) paralyzed legs Other reaction(s): RASH    Lisinopril Cough and Other (See Comments)    Family History  Problem Relation Age of Onset   Hypertension Mother    Heart failure Mother    Heart failure Father    Bladder Cancer Neg Hx    Prostate cancer Neg Hx    Kidney cancer Neg Hx      Prior to Admission medications   Medication Sig Start Date End Date Taking? Authorizing Provider  acetaminophen (TYLENOL) 500 MG tablet Take 1,000 mg by mouth every 6 (six) hours as needed for mild pain.   Yes [provider]  albuterol (VENTOLIN HFA) 108 (90 Base) MCG/ACT inhaler Inhale 1-2 puffs into the lungs every 6 (six) hours as needed for wheezing or shortness of breath. 01/16/21 01/16/22 Yes [provider]  ascorbic acid (VITAMIN C) 500 MG tablet Take 1 tablet (500 mg total) by mouth daily. 11/07/21  Yes Lorella Nimrod, MD  aspirin EC 81 MG tablet Take 1 tablet (81 mg total) by mouth 2 (two) times daily. Swallow whole. 08/10/20  Yes Donne Hazel, MD  calcitRIOL (ROCALTROL) 0.25 MCG capsule Take 0.25 mcg by mouth daily. 07/03/21  Yes [provider]  hydrocerin (EUCERIN) CREA Apply 1 application topically 2 (two) times daily as needed (itching). 11/07/21  Yes Lorella Nimrod, MD  hydrOXYzine (ATARAX) 10 MG tablet Take 10 mg by mouth 3 (three) times daily as needed.  11/14/21  Yes [provider]  levothyroxine (SYNTHROID) 75 MCG tablet Take 1 tablet (75 mcg total) by mouth daily at 6 (six) AM. 10/13/21 11/29/2021 Yes Kc, Maren Beach, MD  liothyronine (CYTOMEL) 5 MCG tablet Take 1 tablet (5 mcg total) by mouth daily. 10/13/21 12/13/2021 Yes Antonieta Pert, MD  metolazone (ZAROXOLYN) 2.5 MG tablet Take 1 tablet (2.5 mg total) by mouth 2 (two) times a week. On Monday and Friday 11/10/21  Yes Lorella Nimrod, MD  midodrine (PROAMATINE) 10 MG tablet Take 1 tablet (10 mg total) by mouth 3 (three) times daily with meals. 10/12/21  Yes Antonieta Pert, MD  Multiple Vitamin (MULTIVITAMIN WITH MINERALS) TABS tablet Take 1 tablet by mouth daily. 10/13/21  Yes Antonieta Pert, MD  Nutritional Supplements (,FEEDING SUPPLEMENT, PROSOURCE PLUS) liquid Take 30 mLs by mouth 2 (two) times daily between meals. 10/12/21  Yes Antonieta Pert, MD  polyethylene glycol (MIRALAX /  GLYCOLAX) 17 g packet Take 17 g by mouth daily. 11/10/21  Yes Lorella Nimrod, MD  pravastatin (PRAVACHOL) 20 MG tablet Take 20 mg by mouth every evening.   Yes [provider]  senna (SENOKOT) 8.6 MG TABS tablet Take 2 tablets by mouth 2 (two) times daily as needed. 11/21/21  Yes [provider]  sodium chloride (OCEAN) 0.65 % SOLN nasal spray Place 1 spray into both nostrils as needed for congestion. 11/07/21  Yes Lorella Nimrod, MD  torsemide 40 MG TABS Take 40 mg by mouth daily. 10/16/21  Yes Antonieta Pert, MD  traMADol (ULTRAM) 50 MG tablet Take 50 mg by mouth every 6 (six) hours as needed. 11/17/21  Yes [provider]  zinc sulfate 220 (50 Zn) MG capsule Take 1 capsule (220 mg total) by mouth daily. 11/08/21  Ezekiel Slocumb, MD    Physical Exam: Vitals:   12/06/2021 0215 11/29/2021 0245 11/29/2021 0250 12/14/2021 0415  BP: (!) 66/49 (!) 69/48 (!) 76/57 (!) 71/59  Pulse: 99 88 86 91  Resp: 13 (!) 23 (!) 30 19  Temp:      TempSrc:      SpO2: 100% 97% 99% 100%    Constitutional: lethargic, cachectic   Eyes: PERTLA,  lids and conjunctivae normal ENMT: Mucous membranes are dry. Posterior pharynx clear of any exudate or lesions.   Neck: supple, no masses  Respiratory: Coarse rhonchi. No accessory muscle use.  Cardiovascular: S1 & S2 heard, regular rate and rhythm. No extremity edema.   Abdomen: scaphoid, no guarding. Bowel sounds active.  Musculoskeletal: no clubbing / cyanosis. No joint deformity upper and lower extremities.   Skin: Diffuse maculopapular rash. Cool, dry. Neurologic: Opens eyes briefly to loud voice. Moving all extremities.    Labs and Imaging on Admission: I have personally reviewed following labs and imaging studies  CBC: Recent Labs  Lab 12/03/2021 0134  WBC 11.3*  NEUTROABS 10.2*  HGB 8.9*  HCT 28.0*  MCV 104.9*  PLT 98*   Basic Metabolic Panel: Recent Labs  Lab 11/29/2021 0134  NA 129*  K 4.5  CL 79*  CO2 28  GLUCOSE 125*  BUN 190*  CREATININE 5.55*  CALCIUM 7.7*   GFR: CrCl cannot be calculated (Unknown ideal weight.). Liver Function Tests: Recent Labs  Lab 11/15/2021 0134  AST 47*  ALT 24  ALKPHOS 72  BILITOT 1.5*  PROT 6.2*  ALBUMIN 2.9*   Recent Labs  Lab 11/15/2021 0134  LIPASE 102*   No results for input(s): AMMONIA in the last 168 hours. Coagulation Profile: Recent Labs  Lab 11/25/2021 0134  INR 1.3*   Cardiac Enzymes: No results for input(s): CKTOTAL, CKMB, CKMBINDEX, TROPONINI in the last 168 hours. BNP (last 3 results) No results for input(s): PROBNP in the last 8760 hours. HbA1C: No results for input(s): HGBA1C in the last 72 hours. CBG: No results for input(s): GLUCAP in the last 168 hours. Lipid Profile: No results for input(s): CHOL, HDL, LDLCALC, TRIG, CHOLHDL, LDLDIRECT in the last 72 hours. Thyroid Function Tests: No results for input(s): TSH, T4TOTAL, FREET4, T3FREE, THYROIDAB in the last 72 hours. Anemia Panel: No results for input(s): VITAMINB12, FOLATE, FERRITIN, TIBC, IRON, RETICCTPCT in the last 72 hours. Urine  analysis:    Component Value Date/Time   COLORURINE YELLOW (A) 10/25/2021 2111   APPEARANCEUR CLOUDY (A) 10/25/2021 2111   APPEARANCEUR Clear 11/22/2015 0950   LABSPEC 1.010 10/25/2021 2111   PHURINE 5.0 10/25/2021 2111   GLUCOSEU NEGATIVE 10/25/2021 2111  HGBUR MODERATE (A) 10/25/2021 2111   BILIRUBINUR NEGATIVE 10/25/2021 2111   BILIRUBINUR Negative 11/22/2015 0950   KETONESUR NEGATIVE 10/25/2021 2111   PROTEINUR 30 (A) 10/25/2021 2111   UROBILINOGEN 1.0 02/16/2010 1336   NITRITE NEGATIVE 10/25/2021 2111   LEUKOCYTESUR LARGE (A) 10/25/2021 2111   Sepsis Labs: '@LABRCNTIP'$ (procalcitonin:4,lacticidven:4) ) Recent Results (from the past 240 hour(s))  Resp Panel by RT-PCR (Flu A&B, Covid) Nasopharyngeal Swab     Status: None   Collection Time: 12/09/2021  1:34 AM   Specimen: Nasopharyngeal Swab; Nasopharyngeal(NP) swabs in vial transport medium  Result Value Ref Range Status   SARS Coronavirus 2 by RT PCR NEGATIVE NEGATIVE Final    Comment: (NOTE) SARS-CoV-2 target nucleic acids are NOT DETECTED.  The SARS-CoV-2 RNA is generally detectable in upper respiratory specimens during the acute phase of infection. The lowest concentration of SARS-CoV-2 viral copies this assay can detect is 138 copies/mL. A negative result does not preclude SARS-Cov-2 infection and should not be used as the sole basis for treatment or other patient management decisions. A negative result may occur with  improper specimen collection/handling, submission of specimen other than nasopharyngeal swab, presence of viral mutation(s) within the areas targeted by this assay, and inadequate number of viral copies(<138 copies/mL). A negative result must be combined with clinical observations, patient history, and epidemiological information. The expected result is Negative.  Fact Sheet for Patients:  EntrepreneurPulse.com.au  Fact Sheet for Healthcare Providers:   IncredibleEmployment.be  This test is no t yet approved or cleared by the [place] FDA and  has been authorized for detection and/or diagnosis of SARS-CoV-2 by FDA under an Emergency Use Authorization (EUA). This EUA will remain  in effect (meaning this test can be used) for the duration of the COVID-19 declaration under Section 564(b)(1) of the Act, 21 U.S.C.section 360bbb-3(b)(1), unless the authorization is terminated  or revoked sooner.       Influenza A by PCR NEGATIVE NEGATIVE Final   Influenza B by PCR NEGATIVE NEGATIVE Final    Comment: (NOTE) The Xpert Xpress SARS-CoV-2/FLU/RSV plus assay is intended as an aid in the diagnosis of influenza from Nasopharyngeal swab specimens and should not be used as a sole basis for treatment. Nasal washings and aspirates are unacceptable for Xpert Xpress SARS-CoV-2/FLU/RSV testing.  Fact Sheet for Patients: EntrepreneurPulse.com.au  Fact Sheet for Healthcare Providers: IncredibleEmployment.be  This test is not yet approved or cleared by the [place] FDA and has been authorized for detection and/or diagnosis of SARS-CoV-2 by FDA under an Emergency Use Authorization (EUA). This EUA will remain in effect (meaning this test can be used) for the duration of the COVID-19 declaration under Section 564(b)(1) of the Act, 21 U.S.C. section 360bbb-3(b)(1), unless the authorization is terminated or revoked.  Performed at [facility], [address], [place], [place] [address]      Radiological Exams on Admission: CT Abdomen Pelvis Wo Contrast  Result Date: 11/21/2021 CLINICAL DATA:  And the rectal bleeding. EXAM: CT ABDOMEN AND PELVIS WITHOUT CONTRAST TECHNIQUE: Multidetector CT imaging of the abdomen and pelvis was performed following the standard protocol without IV contrast. RADIATION DOSE REDUCTION: This exam was performed according to the departmental  dose-optimization program which includes automated exposure control, adjustment of the mA and/or kV according to patient size and/or use of iterative reconstruction technique. COMPARISON:  February 20, 2013 FINDINGS: Lower chest: There is evidence of prior median sternotomy. There is mild to moderate severity cardiomegaly with marked severity coronary artery calcification. Marked  severity, hyperdense areas of atelectasis are seen within the posterior aspect of the bilateral lung bases. Moderate sized bilateral pleural effusions are also seen. Hepatobiliary: No focal liver abnormality is seen. Status post cholecystectomy. No biliary dilatation. Pancreas: Unremarkable. No pancreatic ductal dilatation or surrounding inflammatory changes. Spleen: Normal in size without focal abnormality. Adrenals/Urinary Tract: Adrenal glands are unremarkable. Kidneys are normal in size, with bilateral partially calcified and noncalcified renal cysts. There is mild right-sided hydronephrosis to the level of the right UPJ. Mild left-sided hydronephrosis and hydroureter are also seen, without obstructing renal calculi. The urinary bladder is markedly distended and otherwise unremarkable. Stomach/Bowel: Stomach is within normal limits. Appendix appears normal. The visualized small bowel loops are normal in caliber. Diverticula are seen throughout the large bowel. Stool is also seen throughout the large bowel, with a very large amount of stool noted within the distal sigmoid colon and rectum. Mild to moderate severity thickening of the mid to distal sigmoid colon and rectum is also seen. Vascular/Lymphatic: Marked severity tortuosity and calcification of the abdominal aorta, with marked severity calcification of the mesenteric arteries. No enlarged abdominal or pelvic lymph nodes. Reproductive: Prostate is unremarkable. Other: Marked severity anasarca is seen throughout the abdominal and pelvic walls. There is a moderate amount of abdominopelvic  ascites. Musculoskeletal: A metallic density intramedullary rod and compression screw device are seen within the proximal right femur. There is associated streak artifact with subsequently limited evaluation of the adjacent osseous and soft tissue structures. Marked severity multilevel degenerative changes seen throughout the lumbar spine. IMPRESSION: 1. Mild to moderate severity colitis involving the mid to distal sigmoid colon and rectum, which contains a very large amount of impacted fecal material. 2. Marked severity hyperdense bibasilar atelectasis, which may represent sequelae associated with prior aspiration of oral contrast. 3. Moderate size bilateral pleural effusions. 4. Evidence of prior cholecystectomy. 5. Colonic diverticulosis. 6. Moderate amount of abdominopelvic ascites. Aortic Atherosclerosis (ICD10-I70.0). Electronically Signed   By: Virgina Norfolk M.D.   On: 11/25/2021 02:22   DG Chest Port 1 View  Result Date: 12/10/2021 CLINICAL DATA:  GI bleeding EXAM: PORTABLE CHEST 1 VIEW COMPARISON:  10/25/2021 FINDINGS: Cardiac shadow is enlarged. Postsurgical changes are again seen and stable. Confluent density is noted in the right base consistent with loculated effusion stable from the prior study. Bibasilar atelectatic changes are noted. Small left effusion is noted as well. IMPRESSION: Bilateral effusions right greater than left with bibasilar atelectasis. Electronically Signed   By: Inez Catalina M.D.   On: 11/22/2021 02:17    EKG: Independently reviewed. Sinus tachycardia, LBBB.   Assessment/Plan   1. Hemorrhagic shock  - Presents with large-volume rectal bleeding and hypotension, is poorly responsive  - He continues to have large volume bleeding in ED  - Discussed with patient's son at bedside who indicates that the patient did not want want any aggressive treatments, would not want blood transfusions, and would want [name] to focus on keeping him comfortable at this point  - Plan to  honor patient's wishes, focus on keeping him comfortable    2. Aspiration; acute hypoxic respiratory failure   - New supplemental O2 requirement in ED, imaging suspicious for aspiration  - Continue oxygen as needed for comfort    3. NSTEMI   - Troponin 907 in ED  - Likely type II non-STEMI in setting of acute blood loss with hypotension  - Patient did not want any invasive procedure and family agrees no reason to repeat labs or continue  cardiac monitoring given goal is now comfort   4. CKD V  - Recent AKI, likely cardiorenal syndrome, now BUN & SCr increased from recent discharge in setting of acute blood-loss and hypotension   5. Chronic systolic CHF - EF <63% in January 2023   6. Anemia; thrombocytopenia  - Large volume rectal bleeding continues in ED with baseline anemia and thrombocytopenia  - Patient would not want transfusion per family and they agree no reason to repeat labs given goal of care is comfort     DVT prophylaxis: none (comfort care) Code Status: DNR, discussed with family at bedside  Level of Care: Level of care: Med-Surg Family Communication: Son at bedside  Disposition Plan:  Patient is from: home hospice   Anticipated d/c is to: In-hospital death vs hospice  Anticipated d/c date is: 12-13-2021  Patient currently: starting comfort care  Consults called: none  Admission status: inpatient     Vianne Bulls, MD [facility]  12/13/2021, 4:46 AM

## 2021-11-24 NOTE — Progress Notes (Signed)
Nutrition Brief Note  Chart reviewed. Pt now transitioning to comfort care.  No further nutrition interventions planned at this time.  Please re-consult as needed.   Kolbie Lepkowski W, RD, LDN, CDCES Registered Dietitian II Certified Diabetes Care and Education Specialist Please refer to [facility] for RD and/or RD on-call/weekend/after hours pager   

## 2021-11-24 NOTE — Progress Notes (Signed)
[name]) hospitalized hospice patient visit ? ?[name] is a current hospice patient, admitted 2.23.23 with a diagnosis of hypertensive heart disease with CKD stage 5. [name] was noted to have large amount of rectal bleeding on evening of 3.9.23 and was subsequently taken to the hospital. He was admitted to the hospital on 3.10.23 with a diagnosis of hemorrhagic shock. Per Dr. Karie Georges with [facility] this is a related hospice admission.  ? ?Visited with patient at bedside and talked with daughter and granddaughter. Exchanged report with bedside nurse. They report that patient continues to have blood stools but this is beginning to improve and stools are slowing down. He does not respond at this point and per his family he has not responded since sometime yesterday.  ? ?Vital Signs-  97.9/99/15   129/114   spO2 not registering at this time ?Intake/Output- 2753/0 ?Abnormal labs-  Na+ 129, Chloride 79, Glucose 125, BUN 190, Creatinine 5.55, Ca+7.7, Albumin 2.9, Lipase 102, AST 47, Troponin 907, Lactic Acid 2.3, WBC 11.3, RBC 2.67, Hgb 8.9, HCT 28, Platelets 89 ?Diagnostics-   ?EXAM: ?PORTABLE CHEST 1 VIEW ? COMPARISON:  10/25/2021 ? FINDINGS: ?Cardiac shadow is enlarged. Postsurgical changes are again seen and ?stable. Confluent density is noted in the right base consistent with ?loculated effusion stable from the prior study. Bibasilar ?atelectatic changes are noted. Small left effusion is noted as well. ? IMPRESSION: ?Bilateral effusions right greater than left with bibasilar ?atelectasis. ? Electronically Signed ?  By: Inez Catalina M.D. ?  On: 12/05/2021 02:17 ?EXAM: ?CT ABDOMEN AND PELVIS WITHOUT CONTRAST ?  ?TECHNIQUE: ?Multidetector CT imaging of the abdomen and pelvis was performed ?following the standard protocol without IV contrast. ? RADIATION DOSE REDUCTION: This exam was performed according to the ?departmental dose-optimization program which includes automated ?exposure  control, adjustment of the mA and/or kV according to ?patient size and/or use of iterative reconstruction technique. ?COMPARISON:  February 20, 2013 ? FINDINGS: ?Lower chest: There is evidence of prior median sternotomy. ? There is mild to moderate severity cardiomegaly with marked severity ?coronary artery calcification. ? Marked severity, hyperdense areas of atelectasis are seen within the ?posterior aspect of the bilateral lung bases. ?Moderate sized bilateral pleural effusions are also seen. ? Hepatobiliary: No focal liver abnormality is seen. Status post ?cholecystectomy. No biliary dilatation. ? Pancreas: Unremarkable. No pancreatic ductal dilatation or ?surrounding inflammatory changes. ? Spleen: Normal in size without focal abnormality. ? Adrenals/Urinary Tract: Adrenal glands are unremarkable. Kidneys are ?normal in size, with bilateral partially calcified and noncalcified ?renal cysts. There is mild right-sided hydronephrosis to the level ?of the right UPJ. Mild left-sided hydronephrosis and hydroureter are ?also seen, without obstructing renal calculi. The urinary bladder is ?markedly distended and otherwise unremarkable. ? Stomach/Bowel: Stomach is within normal limits. Appendix appears ?normal. The visualized small bowel loops are normal in caliber. ?Diverticula are seen throughout the large bowel. Stool is also seen ?throughout the large bowel, with a very large amount of stool noted ?within the distal sigmoid colon and rectum. Mild to moderate ?severity thickening of the mid to distal sigmoid colon and rectum is ?also seen. ? Vascular/Lymphatic: Marked severity tortuosity and calcification of ?the abdominal aorta, with marked severity calcification of the ?mesenteric arteries. No enlarged abdominal or pelvic lymph nodes. ? Reproductive: Prostate is unremarkable. ? Other: Marked severity anasarca is seen throughout the abdominal and ?pelvic walls. There is a moderate amount of abdominopelvic  ascites. ?Musculoskeletal: A metallic density intramedullary rod and ?compression  screw device are seen within the proximal right femur. ?There is associated streak artifact with subsequently limited ?evaluation of the adjacent osseous and soft tissue structures. ?Marked severity multilevel degenerative changes seen throughout the ?lumbar spine. ? IMPRESSION: ?1. Mild to moderate severity colitis involving the mid to distal ?sigmoid colon and rectum, which contains a very large amount of ?impacted fecal material. ?2. Marked severity hyperdense bibasilar atelectasis, which may ?represent sequelae associated with prior aspiration of oral ?contrast. ?3. Moderate size bilateral pleural effusions. ?4. Evidence of prior cholecystectomy. ?5. Colonic diverticulosis. ?6. Moderate amount of abdominopelvic ascites. ? Aortic Atherosclerosis (ICD10-I70.0). ? Electronically Signed ?  By: Virgina Norfolk M.D. ?  On: 11/29/2021 02:22 ?IV/PRN Meds- IV NS at 50cc/h, Azactam 2g x1, Cipro '400mg'$  x1, LR 101m bolus x2, Flagyl '500mg'$  x1, Ocreotide 1075m x1 ?Problem List-  ?1. Hemorrhagic shock  ?- Presents with large-volume rectal bleeding and hypotension, is poorly responsive  ?- He continues to have large volume bleeding in ED  ?- Discussed with patient's son at bedside who indicates that the patient did not want want any aggressive treatments, would not want blood transfusions, and would want [place] focus on keeping him comfortable at this point  ?- Plan to honor patient's wishes, focus on keeping him comfortable   ?  ?2. Aspiration; acute hypoxic respiratory failure   ?- New supplemental O2 requirement in ED, imaging suspicious for aspiration  ?- Continue oxygen as needed for comfort   ?  ?3. NSTEMI   ?- Troponin 907 in ED  ?- Likely type II non-STEMI in setting of acute blood loss with hypotension  ?- Patient did not want any invasive procedure and family agrees no reason to repeat labs or continue cardiac monitoring given goal is  now comfort  ?  ?4. CKD V  ?- Recent AKI, likely cardiorenal syndrome, now BUN & SCr increased from recent discharge in setting of acute blood-loss and hypotension  ?  ?5. Chronic systolic CHF ?- EF <2<09%n January 2023  ?  ?6. Anemia; thrombocytopenia  ?- Large volume rectal bleeding continues in ED with baseline anemia and thrombocytopenia  ?- Patient would not want transfusion per family and they agree no reason to repeat labs given goal of care is comfort   ? ?  Discharge Planning- Ongoing, per granddaughter home vs [facility] depending on stabilization ?Family Contact- talked with family at bedside ?IDT- Updated ?Goals of care - Ongoing ?Hospital transfer and medication list placed on patient's hard chart.  ?MiJhonnie GarnerRNTherapist, sportsBSN, WTA-C ?[facility]iaison ?[phone] ? ? ?

## 2021-11-24 NOTE — Progress Notes (Signed)
?  Progress Note ? ? ?Patient: [name] TDS:[id number] DOB: 07/01/1931 DOA: 12/15/2021     0 ?DOS: the patient was seen and examined on 12/07/2021 ?  ?Brief hospital course: ?JAEVON PARAS is a pleasant 88 y.o. male with medical history significant for CHF with EF less than 20%, CKD 5 not on dialysis, anemia and thrombocytopenia, CAD status post CABG, A-fib not anticoagulated, and recent life-threatening myxedema coma who presents with rectal bleeding and is poorly responsive. Patient was recently readmitted to the hospital with acute on chronic systolic CHF complicated by worsening renal function and uremia, indicated that he would not want dialysis, and was discharged on hospice on 11/09/2021.  Hospice nurse visited the patient last night and found him to be poorly responsive and in a pool of blood.  He appeared in pain and uncomfortable.  In the ED, pt was having rectal bleeding and hypotension. ? ?Admitted to the hospital for pain control and other comfort care measures. ? ? ?Same day as admission rounding note. ?Refer to full H&P for Assessment & Plan. ?I have reviewed and agree with Dr. Criss Rosales A&P. ? ?Assessment and Plan: ?See H&P ? ? ?  ? ?Subjective: Pt sleeping and appears comfortable.  No family present at time of my encounter.   ?No acute events or reports of uncontrolled symptoms by staff. ? ? ?Physical Exam: ?Vitals:  ? 11/23/2021 0250 12/05/2021 0415 11/20/2021 0537 11/15/2021 0832  ?BP: (!) 76/57 (!) 71/59 (!) 146/123 (!) 129/114  ?Pulse: 86 91 72 99  ?Resp: (!) '30 19 20 15  '$ ?Temp:   97.9 ?F (36.6 ?C)   ?TempSrc:      ?SpO2: 99% 100% (!) 81%   ?Weight:   60.1 kg   ?Height:   '5\' 11"'$  (1.803 m)   ? ?General exam: sleeping comfortably, no acute distress ?Respiratory system: CTAB no wheezes, rales or rhonchi, normal respiratory effort. ?Cardiovascular system: normal S1/S2, RRR, no pedal edema.   ?Gastrointestinal system: soft, non-tender abdomen ?Central nervous system: Unable to evaluate due to  somnolence ?Extremities: Distal extremities are warm ?Skin: Normal color and temperature, no distal extremity mottling seen ?Psychiatry: Unable to evaluate due to somnolence ? ? ? ?Data Reviewed: ? ?There are no new results to review at this time. ? ?Family Communication: None at bedside during encounter ? ?Disposition: ?Status is: Inpatient ?Remains inpatient appropriate because: On comfort measures, in-hospital death is anticipated ? ? ? Planned Discharge Destination:  n/a ? ? ? ?No charge ? ?Author: ?Ezekiel Slocumb, DO ?11/17/2021 10:56 AM ? ?For on call review [email or web address].  ?

## 2021-11-24 NOTE — ED Provider Notes (Signed)
[facility] Provider Note    Event Date/Time   First MD Initiated Contact with Patient 12/11/2021 0127     (approximate)   History   GI Bleeding   HPI  Level V caveat: Minimally responsive  [name] is a 88 y.o. male brought to the ED via EMS from home with a chief complaint of GI bleed.  Patient is DNR, hospice patient who was found by hospice nurse sitting in a pool of blood and unresponsive.  Daughter had seen patient approximately 1.5 hours prior to that and patient had been in his baseline state of poor health.  Heavy rectal bleeding noted.  Arrives to the ED minimally responsive with gross hematochezia noted.  Rest of history is currently unobtainable secondary to patient's distress.     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   Periprosthetic fracture around internal prosthetic hip joint 08/05/2020   Pressure injury of skin 08/07/2020     Active Problem List   Patient Active Problem List   Diagnosis Date Noted   Hemorrhagic shock ([place]) 12/14/2021   GIB (gastrointestinal bleeding)    Malnutrition of moderate degree 11/09/2021   Protein-calorie malnutrition, severe 11/09/2021   Acute pulmonary edema ([place])    Urinary tract infection without hematuria    Goals of care, counseling/discussion 10/31/2021   Uremia 10/29/2021   Schistocytes on peripheral blood smear 10/26/2021    Hypothyroidism 10/26/2021   Thrombocytopenia ([place]) 10/07/2021   AF (paroxysmal atrial fibrillation) ([place]) 10/06/2021   Demand ischemia ([place]) 10/06/2021   Anemia 10/06/2021   Cardiogenic shock ([place]) 09/28/2021   AKI (acute kidney injury) ([place]) 05/29/2021   Pressure injury of buttock, stage 2 ([place]) 02/01/2021   Acute respiratory failure with hypoxia ([place]) 09/07/2020   Elevated troponin 09/07/2020   Pleural effusion 09/07/2020   Acute on chronic systolic CHF (congestive heart failure) ([place]) 09/07/2020   Thoracic compression fracture ([place]) 09/07/2020   Hyponatremia 08/05/2020   MGUS (monoclonal gammopathy of unknown significance) 05/23/2016   S/P total knee arthroplasty 04/02/2016   CAD in native artery 11/22/2015   TI (tricuspid incompetence) 05/11/2015   Early satiety 01/25/2015   Benign essential HTN 01/03/2015   Abdominal aortic aneurysm (AAA) without rupture 07/09/2014   MI (mitral incompetence) 07/09/2014   Aortic heart valve narrowing 07/09/2014   Cardiomyopathy, ischemic 06/28/2014   Arthritis of knee, degenerative 05/17/2014   Kidney lump 04/09/2013   Benign prostatic hyperplasia with urinary obstruction 03/17/2013   CKD (chronic kidney disease), stage V ([place]) 03/17/2013   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     Past Surgical History   Past Surgical History:  Procedure Laterality Date   AORTIC VALVE REPLACEMENT     bipass     CARDIAC VALVE REPLACEMENT     pig valve   CHOLECYSTECTOMY  Open   COLON SURGERY     History of Hartman's procedure with subsequent colostomy reversal   CORONARY ANGIOPLASTY     CORONARY ARTERY BYPASS GRAFT     ESOPHAGOGASTRODUODENOSCOPY (EGD) WITH PROPOFOL N/A 02/07/2015   Procedure: ESOPHAGOGASTRODUODENOSCOPY (EGD) WITH PROPOFOL;  Surgeon: Hulen Luster, MD;  Location: [facility] ENDOSCOPY;  Service: Gastroenterology;  Laterality: N/A;   EYE SURGERY     HERNIA REPAIR     INTRAMEDULLARY (IM) NAIL INTERTROCHANTERIC Right  05/12/2020   Procedure: INTRAMEDULLARY (IM) NAIL INTERTROCHANTRIC;  Surgeon: Lovell Sheehan, MD;  Location: [facility] ORS;  Service: Orthopedics;  Laterality: Right;   JOINT REPLACEMENT     left knee x4   KNEE ARTHROPLASTY Right 04/02/2016   Procedure: COMPUTER ASSISTED TOTAL KNEE ARTHROPLASTY;  Surgeon: Dereck Leep, MD;  Location: [facility] ORS;  Service: Orthopedics;  Laterality: Right;   ORIF FEMUR FRACTURE Right 08/08/2020   Procedure: OPEN REDUCTION INTERNAL FIXATION (ORIF) DISTAL FEMUR FRACTURE;  Surgeon: Lovell Sheehan, MD;  Location: [facility] ORS;  Service: Orthopedics;  Laterality: Right;     Home Medications   Prior to Admission medications   Medication Sig Start Date End Date Taking? Authorizing Provider  acetaminophen (TYLENOL) 500 MG tablet Take 1,000 mg by mouth every 6 (six) hours as needed for mild pain.   Yes [provider]  albuterol (VENTOLIN HFA) 108 (90 Base) MCG/ACT inhaler Inhale 1-2 puffs into the lungs every 6 (six) hours as needed for wheezing or shortness of breath. 01/16/21 01/16/22 Yes [provider]  ascorbic acid (VITAMIN C) 500 MG tablet Take 1 tablet (500 mg total) by mouth daily. 11/07/21  Yes Lorella Nimrod, MD  aspirin EC 81 MG tablet Take 1 tablet (81 mg total) by mouth 2 (two) times daily. Swallow whole. 08/10/20  Yes Donne Hazel, MD  calcitRIOL (ROCALTROL) 0.25 MCG capsule Take 0.25 mcg by mouth daily. 07/03/21  Yes [provider]  hydrocerin (EUCERIN) CREA Apply 1 application topically 2 (two) times daily as needed (itching). 11/07/21  Yes Lorella Nimrod, MD  hydrOXYzine (ATARAX) 10 MG tablet Take 10 mg by mouth 3 (three) times daily as needed. 11/14/21  Yes [provider]  levothyroxine (SYNTHROID) 75 MCG tablet Take 1 tablet (75 mcg total) by mouth daily at 6 (six) AM. 10/13/21 11/15/2021 Yes Kc, Maren Beach, MD  liothyronine (CYTOMEL) 5 MCG tablet Take 1 tablet (5 mcg total) by mouth daily. 10/13/21 12/13/2021 Yes Antonieta Pert, MD   metolazone (ZAROXOLYN) 2.5 MG tablet Take 1 tablet (2.5 mg total) by mouth 2 (two) times a week. On Monday and Friday 11/10/21  Yes Lorella Nimrod, MD  midodrine (PROAMATINE) 10 MG tablet Take 1 tablet (10 mg total) by mouth 3 (three) times daily with meals. 10/12/21  Yes Antonieta Pert, MD  Multiple Vitamin (MULTIVITAMIN WITH MINERALS) TABS tablet Take 1 tablet by mouth daily. 10/13/21  Yes Antonieta Pert, MD  Nutritional Supplements (,FEEDING SUPPLEMENT, PROSOURCE PLUS) liquid Take 30 mLs by mouth 2 (two) times daily between meals. 10/12/21  Yes Kc, Maren Beach, MD  polyethylene glycol (MIRALAX / GLYCOLAX) 17 g packet Take 17 g by mouth daily. 11/10/21  Yes Lorella Nimrod, MD  pravastatin (PRAVACHOL) 20 MG tablet Take 20 mg by mouth every evening.   Yes [provider]  senna (SENOKOT) 8.6 MG TABS tablet Take 2 tablets by mouth 2 (two) times daily as needed. 11/21/21  Yes [provider]  sodium chloride (OCEAN) 0.65 % SOLN nasal spray Place 1 spray into both  nostrils as needed for congestion. 11/07/21  Yes Lorella Nimrod, MD  torsemide 40 MG TABS Take 40 mg by mouth daily. 10/16/21  Yes Antonieta Pert, MD  traMADol (ULTRAM) 50 MG tablet Take 50 mg by mouth every 6 (six) hours as needed. 11/17/21  Yes [provider]  zinc sulfate 220 (50 Zn) MG capsule Take 1 capsule (220 mg total) by mouth daily. 11/08/21  Yes Lorella Nimrod, MD     Allergies  Morphine, Oxycodone, Carvedilol, Cefuroxime axetil, Iodinated contrast media, and Lisinopril   Family History   Family History  Problem Relation Age of Onset   Hypertension Mother    Heart failure Mother    Heart failure Father    Bladder Cancer Neg Hx    Prostate cancer Neg Hx    Kidney cancer Neg Hx      Physical Exam  Triage Vital Signs: ED Triage Vitals  Enc Vitals Group     BP      Pulse      Resp      Temp      Temp src      SpO2      Weight      Height      Head Circumference      Peak Flow      Pain Score      Pain Loc       Pain Edu?      Excl. in [place]?     Updated Vital Signs: BP (!) 71/59    Pulse 91    Temp 99.4 F (37.4 C) (Oral)    Resp 19    SpO2 100%    General: Minimally responsive.  CV:  Tachycardic.  Thready peripheral perfusion.  Resp:  Increased effort.  Diminished aeration bilaterally. Abd:  Gross hematochezia noted. Other:  Diffuse chronic skin lesions.   ED Results / Procedures / Treatments  Labs (all labs ordered are listed, but only abnormal results are displayed) Labs Reviewed  CBC WITH DIFFERENTIAL/PLATELET - Abnormal; Notable for the following components:      Result Value   WBC 11.3 (*)    RBC 2.67 (*)    Hemoglobin 8.9 (*)    HCT 28.0 (*)    MCV 104.9 (*)    Platelets 98 (*)    Neutro Abs 10.2 (*)    Lymphs Abs 0.5 (*)    Abs Immature Granulocytes 0.08 (*)    All other components within normal limits  COMPREHENSIVE METABOLIC PANEL - Abnormal; Notable for the following components:   Sodium 129 (*)    Chloride 79 (*)    Glucose, Bld 125 (*)    BUN 190 (*)    Creatinine, Ser 5.55 (*)    Calcium 7.7 (*)    Total Protein 6.2 (*)    Albumin 2.9 (*)    AST 47 (*)    Total Bilirubin 1.5 (*)    GFR, Estimated 9 (*)    Anion gap 22 (*)    All other components within normal limits  LIPASE, BLOOD - Abnormal; Notable for the following components:   Lipase 102 (*)    All other components within normal limits  PROTIME-INR - Abnormal; Notable for the following components:   Prothrombin Time 15.8 (*)    INR 1.3 (*)    All other components within normal limits  LACTIC ACID, PLASMA - Abnormal; Notable for the following components:   Lactic Acid, Venous 2.3 (*)    All other  components within normal limits  TROPONIN I (HIGH SENSITIVITY) - Abnormal; Notable for the following components:   Troponin I (High Sensitivity) 907 (*)    All other components within normal limits  RESP PANEL BY RT-PCR (FLU A&B, COVID) ARPGX2  TYPE AND SCREEN  TYPE AND SCREEN     EKG  ED ECG  REPORT I, Liberty Seto J, the attending physician, personally viewed and interpreted this ECG.   Date: 12/12/2021  EKG Time: 0130  Rate: 105  Rhythm: sinus tachycardia  Axis: Normal  Intervals:nonspecific intraventricular conduction delay  ST&T Change: Nonspecific    RADIOLOGY I have independently visualized and reviewed patient's chest x-ray, CTA abdomen/pelvis as well as noted the radiology interpretation:  Chest x-ray: Bilateral pleural effusions  CTA abdomen/pelvis: Moderate colitis, mild left hydronephrosis and hydroureter, bibasilar atelectasis, bilateral pleural effusions  Official radiology report(s): CT Abdomen Pelvis Wo Contrast  Result Date: 11/16/2021 CLINICAL DATA:  And the rectal bleeding. EXAM: CT ABDOMEN AND PELVIS WITHOUT CONTRAST TECHNIQUE: Multidetector CT imaging of the abdomen and pelvis was performed following the standard protocol without IV contrast. RADIATION DOSE REDUCTION: This exam was performed according to the departmental dose-optimization program which includes automated exposure control, adjustment of the mA and/or kV according to patient size and/or use of iterative reconstruction technique. COMPARISON:  February 20, 2013 FINDINGS: Lower chest: There is evidence of prior median sternotomy. There is mild to moderate severity cardiomegaly with marked severity coronary artery calcification. Marked severity, hyperdense areas of atelectasis are seen within the posterior aspect of the bilateral lung bases. Moderate sized bilateral pleural effusions are also seen. Hepatobiliary: No focal liver abnormality is seen. Status post cholecystectomy. No biliary dilatation. Pancreas: Unremarkable. No pancreatic ductal dilatation or surrounding inflammatory changes. Spleen: Normal in size without focal abnormality. Adrenals/Urinary Tract: Adrenal glands are unremarkable. Kidneys are normal in size, with bilateral partially calcified and noncalcified renal cysts. There is mild  right-sided hydronephrosis to the level of the right UPJ. Mild left-sided hydronephrosis and hydroureter are also seen, without obstructing renal calculi. The urinary bladder is markedly distended and otherwise unremarkable. Stomach/Bowel: Stomach is within normal limits. Appendix appears normal. The visualized small bowel loops are normal in caliber. Diverticula are seen throughout the large bowel. Stool is also seen throughout the large bowel, with a very large amount of stool noted within the distal sigmoid colon and rectum. Mild to moderate severity thickening of the mid to distal sigmoid colon and rectum is also seen. Vascular/Lymphatic: Marked severity tortuosity and calcification of the abdominal aorta, with marked severity calcification of the mesenteric arteries. No enlarged abdominal or pelvic lymph nodes. Reproductive: Prostate is unremarkable. Other: Marked severity anasarca is seen throughout the abdominal and pelvic walls. There is a moderate amount of abdominopelvic ascites. Musculoskeletal: A metallic density intramedullary rod and compression screw device are seen within the proximal right femur. There is associated streak artifact with subsequently limited evaluation of the adjacent osseous and soft tissue structures. Marked severity multilevel degenerative changes seen throughout the lumbar spine. IMPRESSION: 1. Mild to moderate severity colitis involving the mid to distal sigmoid colon and rectum, which contains a very large amount of impacted fecal material. 2. Marked severity hyperdense bibasilar atelectasis, which may represent sequelae associated with prior aspiration of oral contrast. 3. Moderate size bilateral pleural effusions. 4. Evidence of prior cholecystectomy. 5. Colonic diverticulosis. 6. Moderate amount of abdominopelvic ascites. Aortic Atherosclerosis (ICD10-I70.0). Electronically Signed   By: Virgina Norfolk M.D.   On: 11/27/2021 02:22   DG Chest [facility]  1 View  Result Date:  12/01/2021 CLINICAL DATA:  GI bleeding EXAM: PORTABLE CHEST 1 VIEW COMPARISON:  10/25/2021 FINDINGS: Cardiac shadow is enlarged. Postsurgical changes are again seen and stable. Confluent density is noted in the right base consistent with loculated effusion stable from the prior study. Bibasilar atelectatic changes are noted. Small left effusion is noted as well. IMPRESSION: Bilateral effusions right greater than left with bibasilar atelectasis. Electronically Signed   By: Inez Catalina M.D.   On: 11/27/2021 02:17     PROCEDURES:  Critical Care performed: Yes, see critical care procedure note(s)  CRITICAL CARE Performed by: Paulette Blanch   Total critical care time: 60 minutes  Critical care time was exclusive of separately billable procedures and treating other patients.  Critical care was necessary to treat or prevent imminent or life-threatening deterioration.  Critical care was time spent personally by me on the following activities: development of treatment plan with patient and/or surrogate as well as nursing, discussions with consultants, evaluation of patient's response to treatment, examination of patient, obtaining history from patient or surrogate, ordering and performing treatments and interventions, ordering and review of laboratory studies, ordering and review of radiographic studies, pulse oximetry and re-evaluation of patient's condition.   Marland Kitchen1-3 Lead EKG Interpretation Performed by: Paulette Blanch, MD Authorized by: Paulette Blanch, MD     Interpretation: abnormal     ECG rate:  105   ECG rate assessment: tachycardic     Rhythm: sinus tachycardia     Ectopy: none     Conduction: normal   Comments:     Patient placed on cardiac monitor to evaluate for arrhythmias   MEDICATIONS ORDERED IN ED: Medications  0.9 %  sodium chloride infusion (has no administration in time range)  fentaNYL (SUBLIMAZE) injection 25 mcg (has no administration in time range)  LORazepam (ATIVAN)  tablet 1 mg (has no administration in time range)    Or  LORazepam (ATIVAN) 2 MG/ML concentrated solution 1 mg (has no administration in time range)    Or  LORazepam (ATIVAN) injection 1 mg (has no administration in time range)  prochlorperazine (COMPAZINE) tablet 5 mg (has no administration in time range)    Or  prochlorperazine (COMPAZINE) suppository 25 mg (has no administration in time range)    Or  prochlorperazine (COMPAZINE) injection 10 mg (has no administration in time range)  glycopyrrolate (ROBINUL) tablet 1 mg (has no administration in time range)    Or  glycopyrrolate (ROBINUL) injection 0.2 mg (has no administration in time range)    Or  glycopyrrolate (ROBINUL) injection 0.2 mg (has no administration in time range)  antiseptic oral rinse (BIOTENE) solution 15 mL (has no administration in time range)  polyvinyl alcohol (LIQUIFILM TEARS) 1.4 % ophthalmic solution 1 drop (has no administration in time range)  lactated ringers bolus 1,000 mL (0 mLs Intravenous Stopped 11/30/2021 0418)  lactated ringers bolus 1,000 mL (0 mLs Intravenous Stopped 11/21/2021 0418)  metroNIDAZOLE (FLAGYL) IVPB 500 mg (0 mg Intravenous Stopped 12/12/2021 0418)  aztreonam (AZACTAM) 2 g in sodium chloride 0.9 % 100 mL IVPB (0 g Intravenous Stopped 12/06/2021 0418)  LORazepam (ATIVAN) injection 0.5 mg (0.5 mg Intravenous Given 11/19/2021 0311)     IMPRESSION / MDM / ASSESSMENT AND PLAN / ED COURSE  I reviewed the triage vital signs and the nursing notes.  88 year old hospice patient sent to the ED for GI bleed. Differential diagnosis includes, but is not limited to, acute appendicitis, renal colic, testicular torsion, urinary tract infection/pyelonephritis, prostatitis,  epididymitis, diverticulitis, small bowel obstruction or ileus, colitis, abdominal aortic aneurysm, gastroenteritis, hernia, etc. I have personally reviewed patient's records and note recent hospitalization 2/8-2/23/2023  for CHF exacerbation and UTI.  The patient is on the cardiac monitor to evaluate for evidence of arrhythmia and/or significant heart rate changes.  Will initiate GI bleed work-up with labs and CT scan.  Awaiting family to arrive so they can further direct me as far as how aggressive they wish to be in the patient's care.  Clinical Course as of 12/03/2021 0533  Fri Nov 24, 2021  0141 Patient's son and daughter-in-law at bedside who wishes for his DNR to be honored but also would like some evaluation.  For now they would like blood work, imaging studies performed.  Agreeable with IV fluids and any medication or intervention which would not prolong patient suffering.  Patient is highly allergic to IV contrast so we will perform CT without.  Will update them with laboratory and imaging results and we will proceed from there. [JS]  0201 Patient back from CT, SBP 60s.  Placed in Trendelenburg position, second liter IV LR initiated. [JS]  0214 Family brought to bedside.  Updated them of patient's hypotension.  Patient actually has his eyes open and responding to his son.  Discussed with family option to initiate vasopressor; however, family wishes to hold vasopressor and are leaning towards comfort care. [JS]  0236 Updated son on CT imaging result demonstrating diffuse colitis.  At this time he would like to initiate IV antibiotics but declines vasopressors.  Will discuss with intensivist. [JS]  [phone] Spoke with ICU intensivist Chauncy Passy regarding family's wishes for limited care; given that family just wants IV fluids and antibiotics and do not wish blood transfusion, vasopressors, intervention such as colonoscopy, cardiac cath or dialysis, will consult and discuss with hospitalist services for evaluation and admission. [JS]    Clinical Course User Index [JS] Paulette Blanch, MD     FINAL CLINICAL IMPRESSION(S) / ED DIAGNOSES   Final diagnoses:  GIB (gastrointestinal bleeding)  Acute renal failure,  unspecified acute renal failure type (HCC)  Elevated troponin  Anemia, unspecified type  Hypotension, unspecified hypotension type  Lactic acidosis     Rx / DC Orders   ED Discharge Orders     None        Note:  This document was prepared using Dragon voice recognition software and may include unintentional dictation errors.   Paulette Blanch, MD 12/11/2021 [phone]

## 2021-11-24 NOTE — ED Notes (Signed)
Family at bedside at this time

## 2021-11-24 NOTE — Care Management (Signed)
Patient is receiving comfort care at this time.  Per notes, in hospital death vs inpatient hospice.   ? ? ? ?Transition of Care (TOC) Screening Note ? ? ?Patient Details  ?Name: [name] ?Date of Birth: 10/10/1930 ? ? ?Transition of Care (TOC) CM/SW Contact:    ?Pete Pelt, RN ?Phone Number: ?11/30/2021, 10:17 AM ? ? ? ?Transition of Care Department [facility]) has reviewed patient and no TOC needs have been identified at this time. We will continue to monitor patient advancement through interdisciplinary progression rounds. If new patient transition needs arise, please place a TOC consult. ?  ?

## 2021-11-25 DIAGNOSIS — Z515 Encounter for palliative care: Principal | ICD-10-CM

## 2021-11-25 LAB — HEMOGLOBIN AND HEMATOCRIT, BLOOD
HCT: 26 % — ABNORMAL LOW (ref 39.0–52.0)
Hemoglobin: 8.6 g/dL — ABNORMAL LOW (ref 13.0–17.0)

## 2021-11-25 MED ORDER — FENTANYL 2500MCG IN NS 250ML (10MCG/ML) PREMIX INFUSION
12.5000 ug/h | INTRAVENOUS | Status: DC
  Administered 2021-11-25: 12.5 ug/h via INTRAVENOUS

## 2021-11-25 MED ORDER — LORAZEPAM 2 MG/ML IJ SOLN
1.0000 mg | INTRAMUSCULAR | Status: DC | PRN
  Administered 2021-11-25 – 2021-11-26 (×2): 1 mg via INTRAVENOUS
  Filled 2021-11-25 (×2): qty 1

## 2021-11-25 MED ORDER — FENTANYL CITRATE PF 50 MCG/ML IJ SOSY: 12.5000 ug | PREFILLED_SYRINGE | INTRAMUSCULAR | Status: DC | PRN

## 2021-11-25 MED ORDER — LORAZEPAM 2 MG/ML PO CONC: 1.0000 mg | ORAL | Status: DC | PRN

## 2021-11-25 MED ORDER — LORAZEPAM 1 MG PO TABS: 1.0000 mg | ORAL_TABLET | ORAL | Status: DC | PRN

## 2021-11-25 MED ORDER — FENTANYL BOLUS VIA INFUSION
25.0000 ug | INTRAVENOUS | Status: DC | PRN
  Filled 2021-11-25: qty 100

## 2021-11-25 NOTE — Assessment & Plan Note (Signed)
No further evaluation, comfort measures. ?

## 2021-11-25 NOTE — Plan of Care (Signed)
Patient remains unresponsive. Had 2 bloody stools throughout the night. Dressing on sacrum changed. Continuous fluids order was discontinued over the night. Family member in room wants to know why patient is not taking thyroid medicine or staying on fluids. Tried to explain the process but she would rather speak with the morning provider. Will inform the day team. Gave ativan at patient's sister request because patient kept moving head from side to side as if restless.Patient has red pin point scabs and skin tears all over the body in various stages of healing. Denied additional needs. ?Problem: Clinical Measurements: ?Goal: Will remain free from infection ?Outcome: Progressing ?Goal: Respiratory complications will improve ?Outcome: Progressing ?Goal: Cardiovascular complication will be avoided ?Outcome: Progressing ?  ?Problem: Activity: ?Goal: Risk for activity intolerance will decrease ?Outcome: Progressing ?  ?Problem: Coping: ?Goal: Level of anxiety will decrease ?Outcome: Progressing ?  ?Problem: Elimination: ?Goal: Will not experience complications related to bowel motility ?Outcome: Progressing ?Goal: Will not experience complications related to urinary retention ?Outcome: Progressing ?  ?Problem: Pain Managment: ?Goal: General experience of comfort will improve ?Outcome: Progressing ?  ?Problem: Safety: ?Goal: Ability to remain free from injury will improve ?Outcome: Progressing ?  ?

## 2021-11-25 NOTE — Assessment & Plan Note (Signed)
Patient had declined proceeding with dialysis during prior admission.  No further lab monitoring per comfort measures. ?

## 2021-11-25 NOTE — Assessment & Plan Note (Addendum)
Came in with rectal bleeding.   ?

## 2021-11-25 NOTE — Assessment & Plan Note (Addendum)
No further evaluation indicated per goals of care ?

## 2021-11-25 NOTE — Assessment & Plan Note (Signed)
Medications discontinued per comfort measures. ?

## 2021-11-25 NOTE — Progress Notes (Addendum)
[facility]) Hospitalized Hospice Patient  ?  ?[name] is a current hospice patient, admitted to hospice services on 2.23.23 with a diagnosis of hypertensive heart disease with CKD stage 5. [name] was noted to have large amount of rectal bleeding on evening of 3.9.23 and was subsequently taken to the hospital. He was admitted to the hospital on 3.10.23 with a diagnosis of hemorrhagic shock. Per Dr. Karie Georges with [facility] this is a related hospice admission. ? ?Patient is not responsive and actively dying. He has no needs that are not currently being met by the hospitals interventions and care. ? ?V/S: 96.6 axillary, 49/38, HR 108, RR 20 , SPO2 87% on 2 lpm ?I&O: not measured, actively dying ?Labs/Diagnostics: none ?IV/PRN: NS @ 50 ml/hr (stopped at 0200); fentanyl 25 mcg IV x 1, ativan 1 mg IV x 1 ? ?Patient remains inpatient appropriate as he is actively dying. ? ?Problem List: ?- hypovolemic shock - GI bleed, full comfort measures ?- actively dying - measures in place to ensure a peaceful transition ? ?GOC: clear, full comfort measures ?D/C planning: hospital death ?Family: present at the bedside ?IDT: hospice team updated ? ?Thank you for caring for [name] and his family, ?Venia Carbon BSN, RN ?[facility] Liaison ? ? ? ? ? ? ? ?

## 2021-11-25 NOTE — Assessment & Plan Note (Signed)
Present on admission due to GI bleeding. ?Transitioned to comfort on admission. ?

## 2021-11-25 NOTE — Assessment & Plan Note (Signed)
Present on admission likely due to hypovolemia.  No further lab monitoring per comfort measures. ?

## 2021-11-25 NOTE — Assessment & Plan Note (Addendum)
Chronic.  Hypotensive on arrival due to GI bleeding.  ?

## 2021-11-25 NOTE — Assessment & Plan Note (Addendum)
Chronic.  Stable.  Meds discontinued ?

## 2021-11-25 NOTE — Progress Notes (Signed)
?Progress Note ? ? ?Patient: [name] DGU:[id number] DOB: [birth year] DOA: 11/23/2021     1 ?DOS: the patient was seen and examined on 11/25/2021 ?  ?Brief hospital course: ?KIMANI HOVIS is a pleasant 88 y.o. male with medical history significant for CHF with EF less than 20%, CKD 5 not on dialysis, anemia and thrombocytopenia, CAD status post CABG, A-fib not anticoagulated, and recent life-threatening myxedema coma who presents with rectal bleeding and is poorly responsive. Patient was recently readmitted to the hospital with acute on chronic systolic CHF complicated by worsening renal function and uremia, indicated that he would not want dialysis, and was discharged on hospice on 11/09/2021.  Hospice nurse visited the patient last night and found him to be poorly responsive and in a pool of blood.  He appeared in pain and uncomfortable.  In the ED, pt was having rectal bleeding and hypotension. ? ?Admitted to the hospital for pain control and other comfort care measures. ? ?3/11: Patient's BP this morning 49/38.  Patient unresponsive, appearing comfortable and without distress.  Daughter at bedside. ? ?Assessment and Plan: ?* Comfort measures only status ?Patient had been discharged home from the hospital with hospice following.  Found with rectal bleeding at home and presented hypotensive and anemic.  Transitioned to comfort measures at time of admission per family. ?-- Continue comfort measures per orders ?-- Notify provider if any signs of pain distress or discomfort ? ?3/11: BP 49/38 this morning.  Anticipate no more than 24 to 48 hours unless BP increases. ? ?GIB (gastrointestinal bleeding) ?No further evaluation indicated.  On comfort care. ? ?Hemoglobin obtained for prognosis, noted stable at 8.6 this morning. ? ?Hemorrhagic shock ([place]) ?Present on admission due to GI bleeding. ?Transitioned to comfort on admission. ? ?Anemia ?Came in with rectal bleeding.   ?Hemoglobin checked for prognostic purposes  this morning, stable at 8.6. ?-- No further labs unless patient stabilizes and we need for prognosis ? ?Demand ischemia ([place]) ?No further evaluation, comfort measures. ? ?AF (paroxysmal atrial fibrillation) ([place]) ?Medications discontinued per comfort measures. ? ?Hyponatremia ?Present on admission likely due to hypovolemia.  No further lab monitoring per comfort measures. ? ?CAD in native artery ?Chronic.  Stable.  Meds discontinued per comfort measures. ? ?CKD (chronic kidney disease), stage V ([employer]) ?Patient had declined proceeding with dialysis during prior admission.  No further lab monitoring per comfort measures. ? ?Benign essential HTN ?Chronic.  Currently hypotensive.  On comfort measures. ? ? ? ? ?  ? ?Subjective: Patient seen resting comfortably with daughter at bedside this morning.  His blood pressure is quite low.  Discussed with daughter that we anticipate patient to pass within the next 24 to 48 hours unless blood pressure upon tenuously improved.  She had inquired about his thyroid medication and confusion without this.  Discussed providing thyroid hormone unlikely to change outcomes and would still anticipate death within hours to days.  She expressed understanding. ? ?Physical Exam: ?Vitals:  ? 11/15/2021 0537 11/27/2021 9563 11/25/21 0649 11/25/21 0752  ?BP: (!) 146/123 (!) 129/114 (!) 65/55 (!) 49/38  ?Pulse: 72 99 92 (!) 108  ?Resp: '20 15  20  '$ ?Temp: 97.9 ?F (36.6 ?C)  (!) 97.1 ?F (36.2 ?C) (!) 96.6 ?F (35.9 ?C)  ?TempSrc:   Axillary Axillary  ?SpO2: (!) 81%  100% (!) 87%  ?Weight: 60.1 kg     ?Height: '5\' 11"'$  (1.803 m)     ? ?General exam: Sleeping, appears comfortable, unresponsive, no acute distress ?  HEENT: Mouth open, eyes remain closed ?Respiratory system: CTAB without crackles, intermittent very brief apneic episodes seen during encounter ?Cardiovascular system: Tachycardic, regular rhythm no pedal edema.   ?Central nervous system: Unable to evaluate as patient is unresponsive ?Extremities: no  edema, normal tone ?Skin: Extensive diffuse ecchymosis of bilateral upper extremities, scattered nevi appearing lesions across face and head appear stable ?Psychiatry: Unable to evaluate as patient is unresponsive ? ? ?Data Reviewed: ? ?Hemoglobin checked this morning for prognosis purposes, relatively stable at 8.6 down from 8.9 ? ?Family Communication: Daughter at bedside on rounds ? ?Disposition: ?Status is: Inpatient ?Remains inpatient appropriate because: On comfort measures with end-of-life expected within only hours to days.  Hypotensive and unstable for transport for hospice care outside the hospital ? ? ? Planned Discharge Destination:  n/a ? ? ? ?Time spent: 35 minutes ? ?Author: ?Ezekiel Slocumb, DO ?11/25/2021 11:31 AM ? ?For on call review [email or web address].  ?

## 2021-11-25 NOTE — Assessment & Plan Note (Addendum)
Patient had been discharged home from the hospital with hospice following.  Found with rectal bleeding at home and presented hypotensive and anemic.  Transitioned to comfort measures at time of admission per family. ?

## 2021-11-26 DIAGNOSIS — Z515 Encounter for palliative care: Secondary | ICD-10-CM | POA: Diagnosis not present

## 2021-11-28 LAB — TYPE AND SCREEN
ABO/RH(D): A POS
Antibody Screen: NEGATIVE

## 2021-12-16 NOTE — Death Summary Note (Signed)
DEATH SUMMARY   Patient Details  Name: [name] MRN: [id number] DOB: [birth year] KDX:[id number], Chrystie Nose, MD Admission/Discharge Information   Admit Date:  Dec 23, 2021  Date of Death: Date of Death: 12/25/2021  Time of Death: Time of Death: December 28, 1632  Length of Stay: 2   Principle Cause of death: Hemorrhagic shock  Hospital Diagnoses: Principal Problem:   Comfort measures only status Active Problems:   Benign essential HTN   CKD (chronic kidney disease), stage V (HCC)   CAD in native artery   Hyponatremia   AF (paroxysmal atrial fibrillation) (HCC)   Demand ischemia (HCC)   Anemia   Thrombocytopenia (HCC)   Hemorrhagic shock (HCC)   GIB (gastrointestinal bleeding)   Hospital Course: [name] is a pleasant 88 y.o. male with medical history significant for CHF with EF less than 20%, CKD 5 not on dialysis, anemia and thrombocytopenia, CAD status post CABG, A-fib not anticoagulated, and recent life-threatening myxedema coma who presents with rectal bleeding and is poorly responsive. Patient was recently readmitted to the hospital with acute on chronic systolic CHF complicated by worsening renal function and uremia, indicated that he would not want dialysis, and was discharged on hospice on 11/09/2021.  Hospice nurse visited the patient last night and found him to be poorly responsive and in a pool of blood.  He appeared in pain and uncomfortable.  In the ED, pt was having rectal bleeding and hypotension.  Admitted to the hospital for pain control and other comfort care measures.  On 3/11 patient's BP was noted 49/38.  Patient remained unresponsive but later in the day did have increased work of breathing and noted to appear quite uncomfortable at times.  Family was in agreement patient was started on fentanyl infusion.  Patient remained on comfort measures and subsequently passed away on 2022/12/26 at 1634.  Assessment and Plan: * Comfort measures only status Patient had been  discharged home from the hospital with hospice following.  Found with rectal bleeding at home and presented hypotensive and anemic.  Transitioned to comfort measures at time of admission per family.  GIB (gastrointestinal bleeding) No further evaluation indicated per goals of care  Hemorrhagic shock ([place]) Present on admission due to GI bleeding. Transitioned to comfort on admission.  Anemia Came in with rectal bleeding.    Demand ischemia ([place]) No further evaluation, comfort measures.  AF (paroxysmal atrial fibrillation) (HCC) Medications discontinued per comfort measures.  Hyponatremia Present on admission likely due to hypovolemia.  No further lab monitoring per comfort measures.  CAD in native artery Chronic.  Stable.  Meds discontinued  CKD (chronic kidney disease), stage V [facility]) Patient had declined proceeding with dialysis during prior admission.  No further lab monitoring per comfort measures.  Benign essential HTN Chronic.  Hypotensive on arrival due to GI bleeding.          Procedures: None  Consultations: None  The results of significant diagnostics from this hospitalization (including imaging, microbiology, ancillary and laboratory) are listed below for reference.   Significant Diagnostic Studies: CT Abdomen Pelvis Wo Contrast  Result Date: 23-Dec-2021 CLINICAL DATA:  And the rectal bleeding. EXAM: CT ABDOMEN AND PELVIS WITHOUT CONTRAST TECHNIQUE: Multidetector CT imaging of the abdomen and pelvis was performed following the standard protocol without IV contrast. RADIATION DOSE REDUCTION: This exam was performed according to the departmental dose-optimization program which includes automated exposure control, adjustment of the mA and/or kV according to patient size and/or use of iterative reconstruction technique. COMPARISON:  February 20, 2013 FINDINGS: Lower chest: There is evidence of prior median sternotomy. There is mild to moderate severity cardiomegaly with  marked severity coronary artery calcification. Marked severity, hyperdense areas of atelectasis are seen within the posterior aspect of the bilateral lung bases. Moderate sized bilateral pleural effusions are also seen. Hepatobiliary: No focal liver abnormality is seen. Status post cholecystectomy. No biliary dilatation. Pancreas: Unremarkable. No pancreatic ductal dilatation or surrounding inflammatory changes. Spleen: Normal in size without focal abnormality. Adrenals/Urinary Tract: Adrenal glands are unremarkable. Kidneys are normal in size, with bilateral partially calcified and noncalcified renal cysts. There is mild right-sided hydronephrosis to the level of the right UPJ. Mild left-sided hydronephrosis and hydroureter are also seen, without obstructing renal calculi. The urinary bladder is markedly distended and otherwise unremarkable. Stomach/Bowel: Stomach is within normal limits. Appendix appears normal. The visualized small bowel loops are normal in caliber. Diverticula are seen throughout the large bowel. Stool is also seen throughout the large bowel, with a very large amount of stool noted within the distal sigmoid colon and rectum. Mild to moderate severity thickening of the mid to distal sigmoid colon and rectum is also seen. Vascular/Lymphatic: Marked severity tortuosity and calcification of the abdominal aorta, with marked severity calcification of the mesenteric arteries. No enlarged abdominal or pelvic lymph nodes. Reproductive: Prostate is unremarkable. Other: Marked severity anasarca is seen throughout the abdominal and pelvic walls. There is a moderate amount of abdominopelvic ascites. Musculoskeletal: A metallic density intramedullary rod and compression screw device are seen within the proximal right femur. There is associated streak artifact with subsequently limited evaluation of the adjacent osseous and soft tissue structures. Marked severity multilevel degenerative changes seen throughout  the lumbar spine. IMPRESSION: 1. Mild to moderate severity colitis involving the mid to distal sigmoid colon and rectum, which contains a very large amount of impacted fecal material. 2. Marked severity hyperdense bibasilar atelectasis, which may represent sequelae associated with prior aspiration of oral contrast. 3. Moderate size bilateral pleural effusions. 4. Evidence of prior cholecystectomy. 5. Colonic diverticulosis. 6. Moderate amount of abdominopelvic ascites. Aortic Atherosclerosis (ICD10-I70.0). Electronically Signed   By: Virgina Norfolk M.D.   On: 12/13/2021 02:22   DG Chest Port 1 View  Result Date: 12/08/2021 CLINICAL DATA:  GI bleeding EXAM: PORTABLE CHEST 1 VIEW COMPARISON:  10/25/2021 FINDINGS: Cardiac shadow is enlarged. Postsurgical changes are again seen and stable. Confluent density is noted in the right base consistent with loculated effusion stable from the prior study. Bibasilar atelectatic changes are noted. Small left effusion is noted as well. IMPRESSION: Bilateral effusions right greater than left with bibasilar atelectasis. Electronically Signed   By: Inez Catalina M.D.   On: 12/02/2021 02:17    Microbiology: Recent Results (from the past 240 hour(s))  Resp Panel by RT-PCR (Flu A&B, Covid) Nasopharyngeal Swab     Status: None   Collection Time: 11/23/2021  1:34 AM   Specimen: Nasopharyngeal Swab; Nasopharyngeal(NP) swabs in vial transport medium  Result Value Ref Range Status   SARS Coronavirus 2 by RT PCR NEGATIVE NEGATIVE Final    Comment: (NOTE) SARS-CoV-2 target nucleic acids are NOT DETECTED.  The SARS-CoV-2 RNA is generally detectable in upper respiratory specimens during the acute phase of infection. The lowest concentration of SARS-CoV-2 viral copies this assay can detect is 138 copies/mL. A negative result does not preclude SARS-Cov-2 infection and should not be used as the sole basis for treatment or other patient management decisions. A negative result may  occur with  improper specimen collection/handling, submission of specimen other than nasopharyngeal swab, presence of viral mutation(s) within the areas targeted by this assay, and inadequate number of viral copies(<138 copies/mL). A negative result must be combined with clinical observations, patient history, and epidemiological information. The expected result is Negative.  Fact Sheet for Patients:  EntrepreneurPulse.com.au  Fact Sheet for Healthcare Providers:  IncredibleEmployment.be  This test is no t yet approved or cleared by the [place] FDA and  has been authorized for detection and/or diagnosis of SARS-CoV-2 by FDA under an Emergency Use Authorization (EUA). This EUA will remain  in effect (meaning this test can be used) for the duration of the COVID-19 declaration under Section 564(b)(1) of the Act, 21 U.S.C.section 360bbb-3(b)(1), unless the authorization is terminated  or revoked sooner.       Influenza A by PCR NEGATIVE NEGATIVE Final   Influenza B by PCR NEGATIVE NEGATIVE Final    Comment: (NOTE) The Xpert Xpress SARS-CoV-2/FLU/RSV plus assay is intended as an aid in the diagnosis of influenza from Nasopharyngeal swab specimens and should not be used as a sole basis for treatment. Nasal washings and aspirates are unacceptable for Xpert Xpress SARS-CoV-2/FLU/RSV testing.  Fact Sheet for Patients: EntrepreneurPulse.com.au  Fact Sheet for Healthcare Providers: IncredibleEmployment.be  This test is not yet approved or cleared by the [place] FDA and has been authorized for detection and/or diagnosis of SARS-CoV-2 by FDA under an Emergency Use Authorization (EUA). This EUA will remain in effect (meaning this test can be used) for the duration of the COVID-19 declaration under Section 564(b)(1) of the Act, 21 U.S.C. section 360bbb-3(b)(1), unless the authorization is terminated  or revoked.  Performed at [facility], [address] [place], [place] [address]     Time spent: 25 minutes  Signed: Ezekiel Slocumb, DO 11/27/21

## 2021-12-16 NOTE — Progress Notes (Signed)
[facility]) Hospitalized Hospice Patient  ?  ?[name] is a current hospice patient, admitted to hospice services on 2.23.23 with a diagnosis of hypertensive heart disease with CKD stage 5. [name] was noted to have large amount of rectal bleeding on evening of 3.9.23 and was subsequently taken to the hospital. He was admitted to the hospital on 3.10.23 with a diagnosis of hemorrhagic shock. Per Dr. Karie Georges with [facility] this is a related hospice admission. ?  ?Patient is not responsive and actively dying. He has no needs that are not currently being met by the hospitals interventions and care. ?  ?V/S: 97.4 axillary, 60/39, HR 101, RR 14 , SPO2 97% on 3 lpm ?I&O: not measured, actively dying ?Labs/Diagnostics: none ?IV/PRN: fentanyl 25 mcg IV x 2, ativan 1 mg IV x 2 ?  ?Patient remains inpatient appropriate as he is actively dying. ?  ?Problem List: ?- hypovolemic shock - GI bleed, full comfort measures ?- actively dying - measures in place to ensure a peaceful transition ?  ?GOC: clear, full comfort measures ?D/C planning: hospital death ?Family:  dtr present at the bedside; spoke with son by phone ?IDT: hospice team updated ?  ?Thank you for caring for [name] and his family, ?Venia Carbon BSN, RN ?[facility] Liaison ?

## 2021-12-16 NOTE — Progress Notes (Signed)
Patient expired at 32. Death pronounced by Lucious Groves, RN and Andre Lefort, RN.  Dr Arbutus Ped has been notified. AC made aware. Patient's daughter and daughter in law at the bedside.  ?

## 2021-12-16 NOTE — Progress Notes (Signed)
?Progress Note ? ? ?Patient: [name] WHQ:[id number] DOB: 04/16/1931 DOA: 11/15/2021     2 ?DOS: the patient was seen and examined on 12-02-21 ?  ?Brief hospital course: ?[name] is a pleasant 88 y.o. male with medical history significant for CHF with EF less than 20%, CKD 5 not on dialysis, anemia and thrombocytopenia, CAD status post CABG, A-fib not anticoagulated, and recent life-threatening myxedema coma who presents with rectal bleeding and is poorly responsive. Patient was recently readmitted to the hospital with acute on chronic systolic CHF complicated by worsening renal function and uremia, indicated that he would not want dialysis, and was discharged on hospice on 11/09/2021.  Hospice nurse visited the patient last night and found him to be poorly responsive and in a pool of blood.  He appeared in pain and uncomfortable.  In the ED, pt was having rectal bleeding and hypotension. ? ?Admitted to the hospital for pain control and other comfort care measures. ? ?3/11: Patient's BP this morning 49/38.  Patient unresponsive, appearing comfortable and without distress.  Daughter at bedside. ? ?Assessment and Plan: ?* Comfort measures only status ?Patient had been discharged home from the hospital with hospice following.  Found with rectal bleeding at home and presented hypotensive and anemic.  Transitioned to comfort measures at time of admission per family. ?-- Continue comfort measures per orders ?-- Notify provider if any signs of pain distress or discomfort ? ?3/11: BP 49/38 this morning.  Anticipate no more than 24 to 48 hours unless BP increases. ? ?GIB (gastrointestinal bleeding) ?No further evaluation indicated.  On comfort care. ? ?Hemoglobin obtained for prognosis, noted stable at 8.6 this morning. ? ?Hemorrhagic shock ([place]) ?Present on admission due to GI bleeding. ?Transitioned to comfort on admission. ? ?Anemia ?Came in with rectal bleeding.   ?Hemoglobin checked for prognostic purposes  this morning, stable at 8.6. ?-- No further labs unless patient stabilizes and we need for prognosis ? ?Demand ischemia ([place]) ?No further evaluation, comfort measures. ? ?AF (paroxysmal atrial fibrillation) ([place]) ?Medications discontinued per comfort measures. ? ?Hyponatremia ?Present on admission likely due to hypovolemia.  No further lab monitoring per comfort measures. ? ?CAD in native artery ?Chronic.  Stable.  Meds discontinued per comfort measures. ? ?CKD (chronic kidney disease), stage V ([place]) ?Patient had declined proceeding with dialysis during prior admission.  No further lab monitoring per comfort measures. ? ?Benign essential HTN ?Chronic.  Currently hypotensive.  On comfort measures. ? ? ? ? ?  ? ?Subjective: Patient seen this morning with daughter at bedside.  He is on fentanyl infusion for comfort measures.  No acute events reported.  Patient remains unresponsive, distal extremities remain warm. ? ?Physical Exam: ?Vitals:  ? 12/13/2021 0537 12/05/2021 6599 11/25/21 0649 11/25/21 0752  ?BP: (!) 146/123 (!) 129/114 (!) 65/55 (!) 49/38  ?Pulse: 72 99 92 (!) 108  ?Resp: '20 15  20  '$ ?Temp: 97.9 ?F (36.6 ?C)  (!) 97.1 ?F (36.2 ?C) (!) 96.6 ?F (35.9 ?C)  ?TempSrc:   Axillary Axillary  ?SpO2: (!) 81%  100% (!) 87%  ?Weight: 60.1 kg     ?Height: '5\' 11"'$  (1.803 m)     ? ?General exam: Sleeping, appears comfortable, unresponsive, no acute distress ?HEENT: Mouth open, eyes remain closed ?Respiratory system: Lungs clear, some use of accessory muscles seen ?Cardiovascular system: Tachycardic, regular rhythm no pedal edema.   ?Central nervous system: Unable to evaluate as patient is unresponsive ?Extremities: no edema, normal tone ?Psychiatry: Unable to  evaluate as patient is unresponsive ? ? ?Data Reviewed: ?No new labs today.  Last hemoglobin stable at 8.6 ? ? ?Family Communication: Daughter at bedside on rounds ? ?Disposition: ?Status is: Inpatient ?Remains inpatient appropriate because: On comfort measures with  end-of-life expected within only hours to days.  Hypotensive and unstable for transport for hospice care outside the hospital ? ? ? Planned Discharge Destination:  n/a ? ? ? ?Time spent: 25 minutes ? ?Author: ?Ezekiel Slocumb, DO ?Dec 21, 2021 11:59 AM ? ?For on call review [email or web address].  ?

## 2021-12-16 DEATH — deceased

## 2022-05-30 IMAGING — DX DG CHEST 1V PORT
1 series · 1 of 1 positions shown · non-contrast
Comparison: August 07, 2020

CLINICAL DATA: Shortness of breath.

EXAM:
PORTABLE CHEST 1 VIEW

[chest ap]
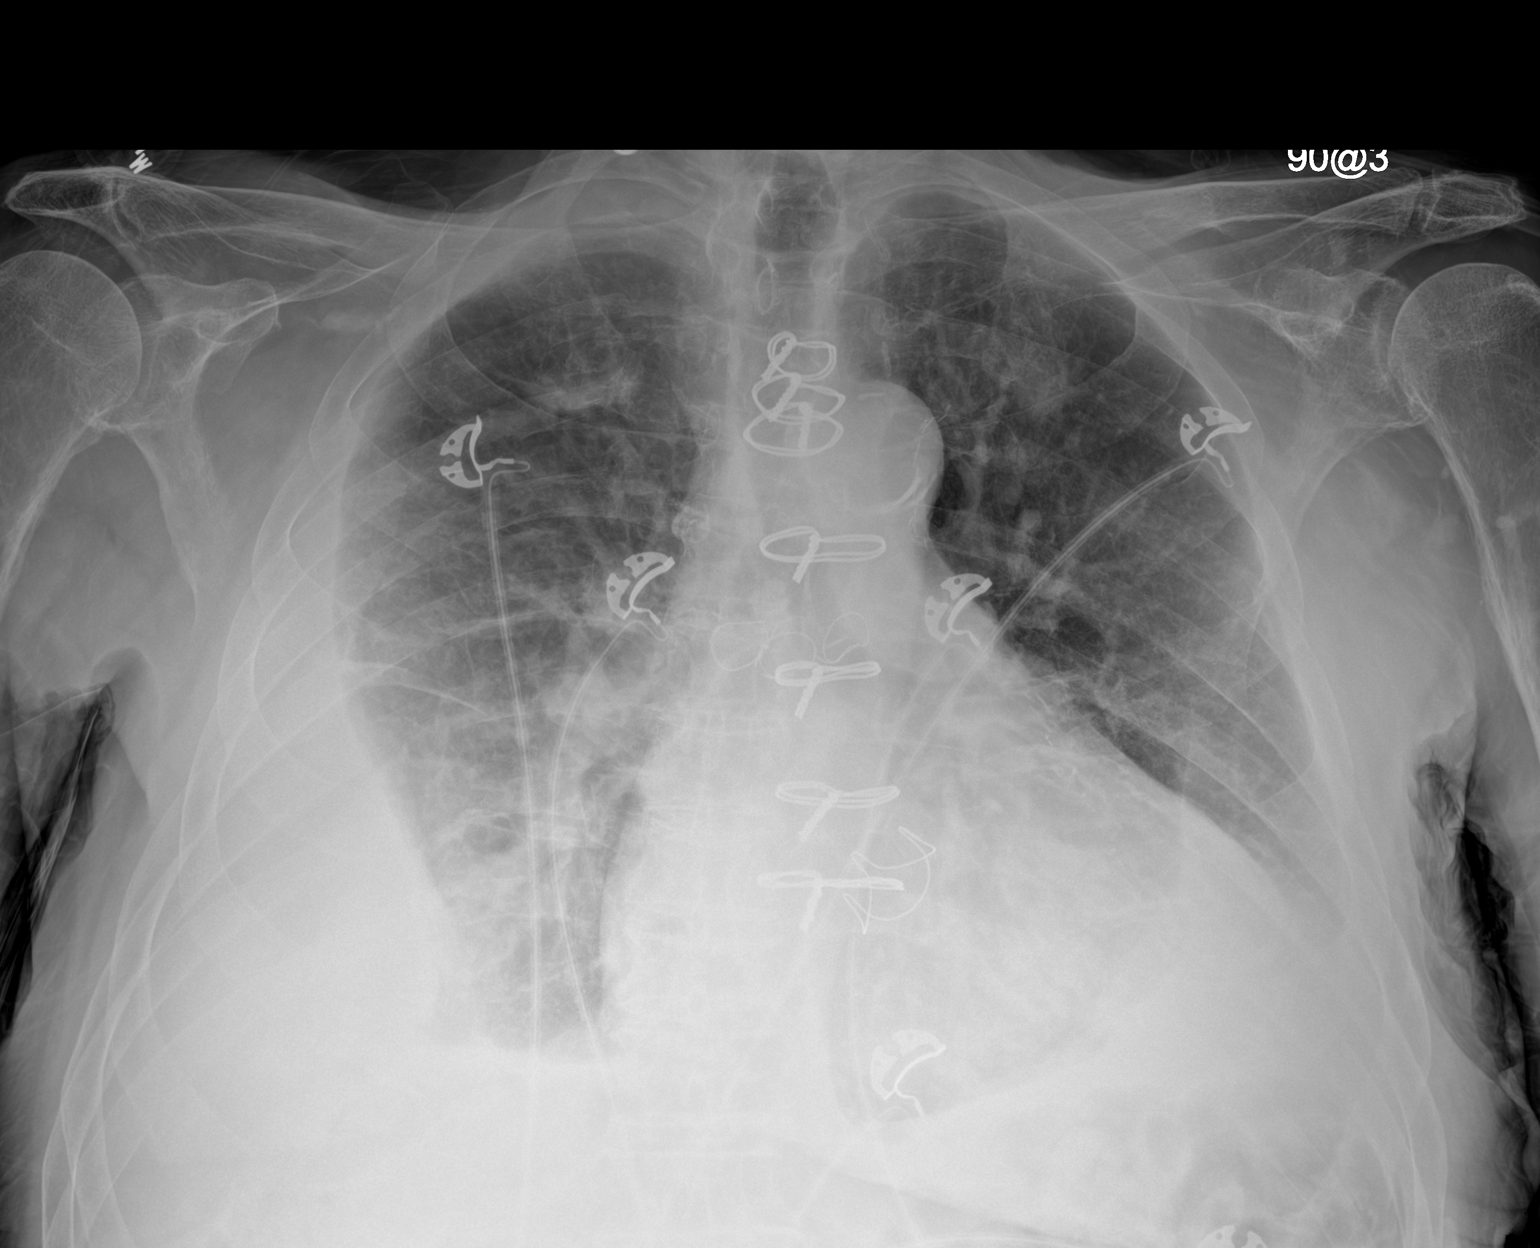

[1 of 1 positions shown; findings below may reference images not displayed]

FINDINGS: Multiple sternal wires are seen. Diffusely prominent interstitial
lung markings are noted with mild to moderate severity areas of
atelectasis and/or infiltrate within the bilateral lung bases. A
stable moderate size right pleural effusion is seen with a small,
stable left pleural effusion. No pneumothorax is identified. There
is mild to moderate severity enlargement of the cardiac silhouette.
An artificial aortic valve is also noted. There is moderate severity
calcification of the aortic arch. Degenerative changes seen
throughout the thoracic spine.
IMPRESSION: 1. Evidence of prior median sternotomy/CABG.
2. Stable bilateral pleural effusions with mild to moderate severity
bibasilar atelectasis and/or infiltrate.

## 2022-06-01 IMAGING — DX DG CHEST 1V
1 series · 1 of 1 positions shown · non-contrast
Comparison: Portable exam 5458 hours compared to 8385 hours

CLINICAL DATA: Post biopsy, RIGHT lung pneumothorax

EXAM:
CHEST  1 VIEW

[chest ap]
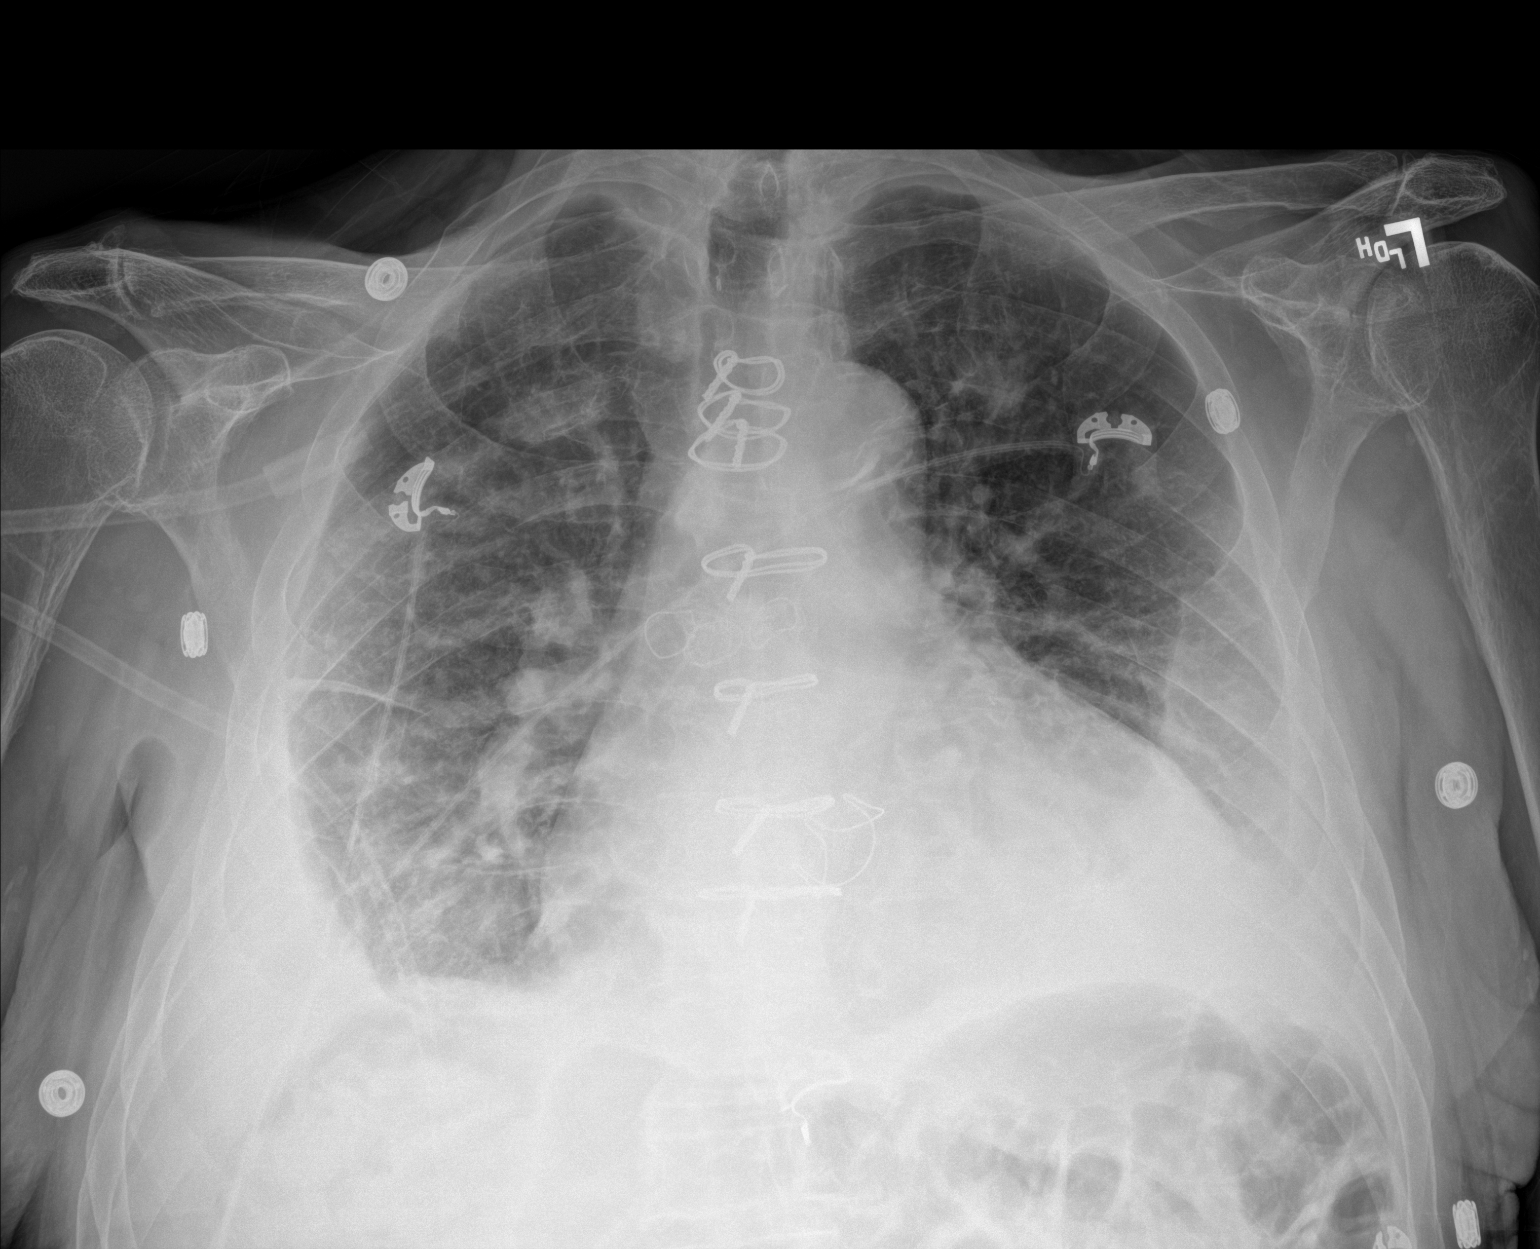

[1 of 1 positions shown; findings below may reference images not displayed]

FINDINGS: Enlargement of cardiac silhouette post CABG and AVR.

Mediastinal contours and pulmonary vascularity normal.

Atherosclerotic calcification aorta.

Tiny persistent RIGHT apex pneumothorax.

Bibasilar pleural effusions and atelectasis.

Questionable perihilar infiltrate.

Bones demineralized.
IMPRESSION: Persistent tiny RIGHT apex pneumothorax.

## 2022-06-02 IMAGING — CR DG CHEST 2V
1 series · 2 of 2 positions shown · non-contrast
Comparison: 09/08/2020

CLINICAL DATA: Shortness of breath

EXAM:
CHEST - 2 VIEW

[Series 1: dg chest 2 view · 0.14mm/px · 2 of 2 slices shown]
[im 1/2]
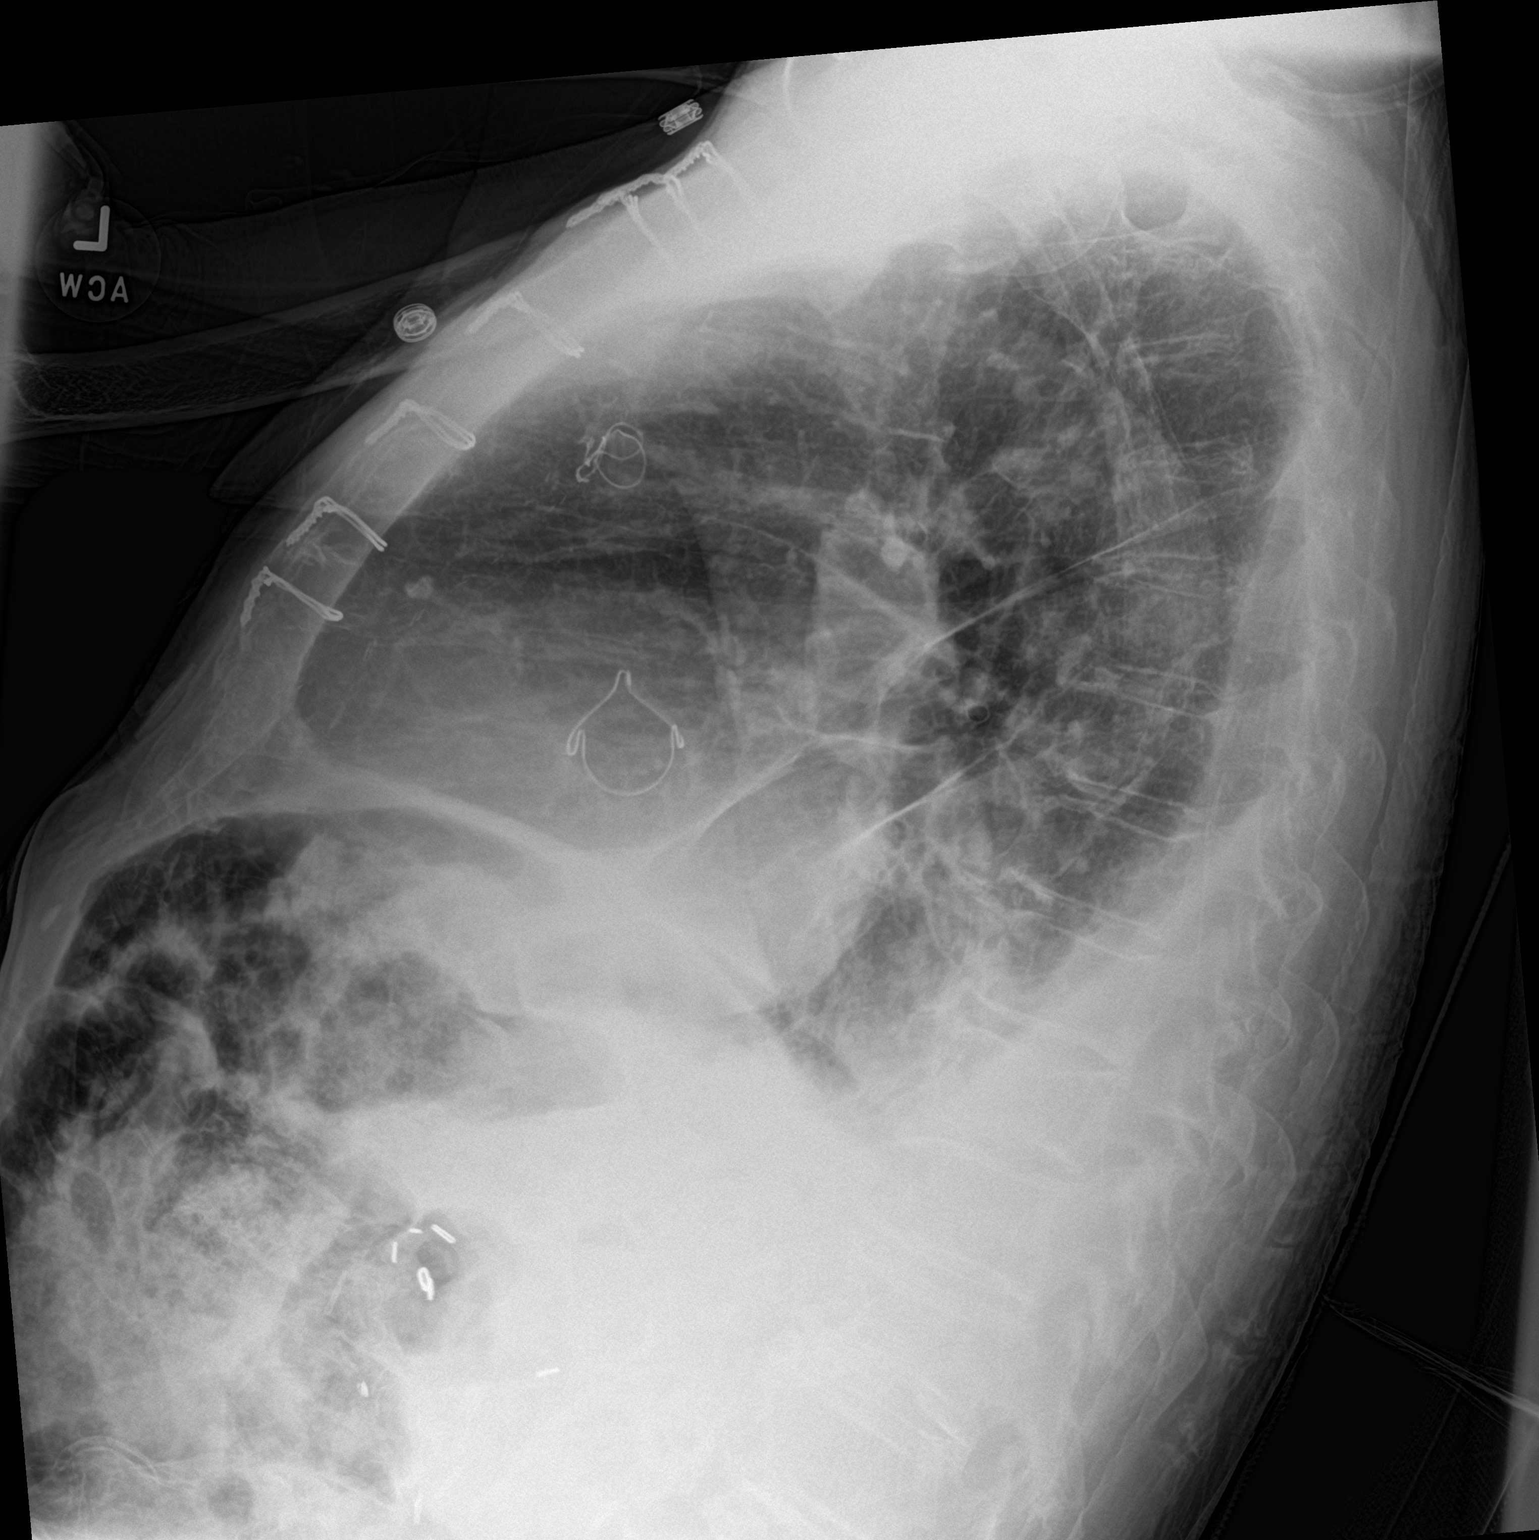
[im 2/2]
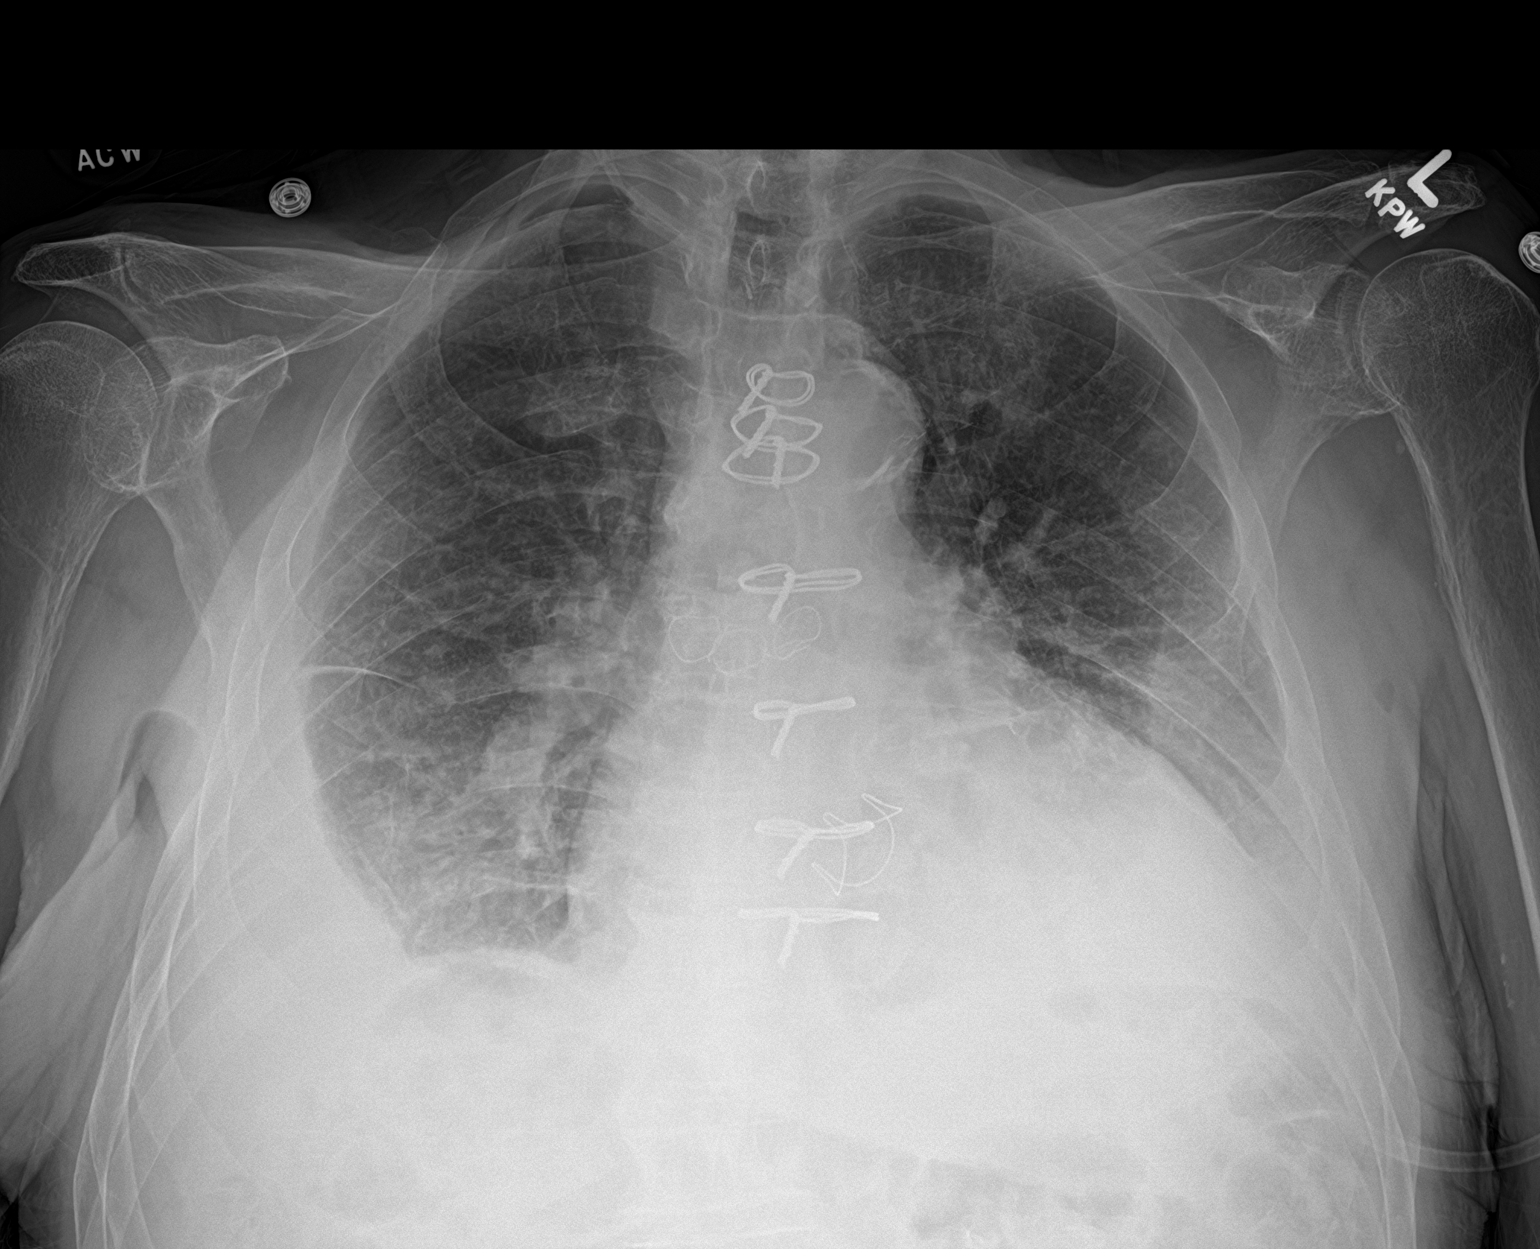

[2 of 2 positions shown; findings below may reference images not displayed]

FINDINGS: Cardiac shadow is enlarged. Postsurgical changes are again seen and
stable. Bilateral pleural effusions are noted relatively stable from
the prior exam. No new focal infiltrate is seen.
IMPRESSION: Stable bilateral effusions.

## 2022-10-18 IMAGING — DX DG CHEST 1V PORT
1 series · 1 of 1 positions shown · non-contrast
Comparison: 09/09/2020

CLINICAL DATA: CHF, status post left thoracentesis

EXAM:
PORTABLE CHEST 1 VIEW

[chest ap]
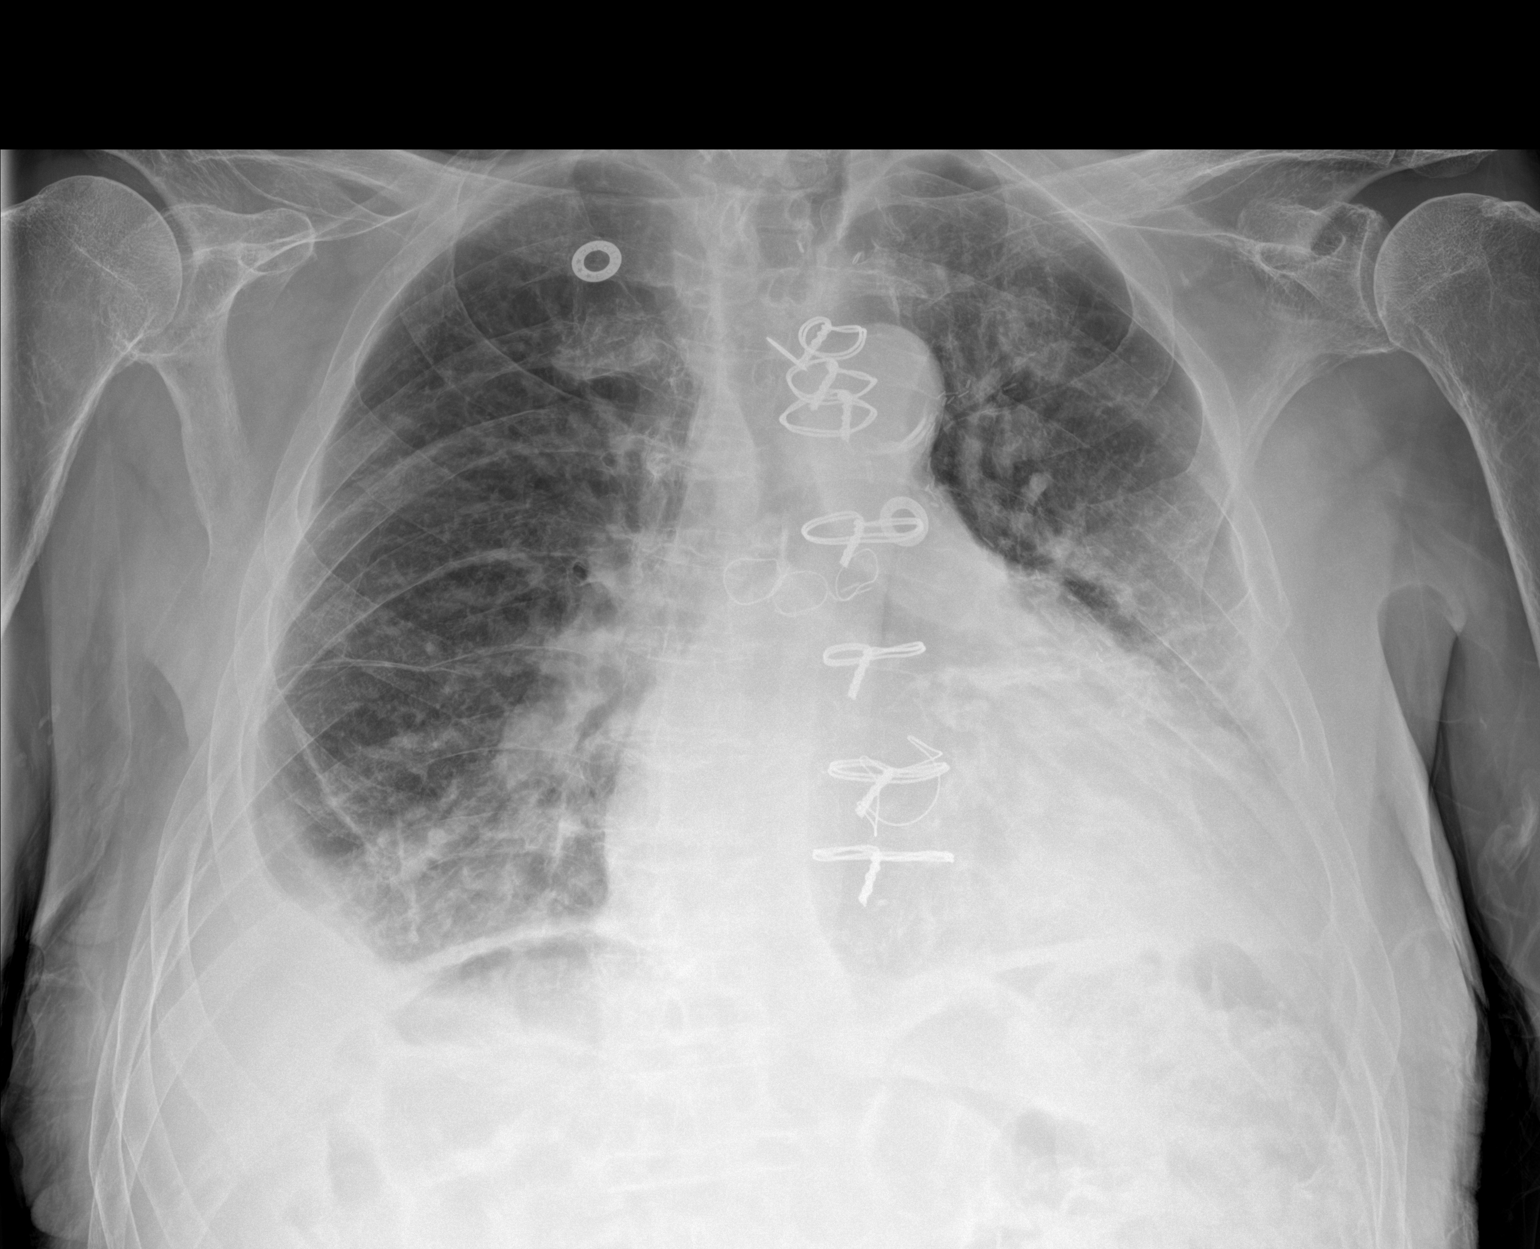

[1 of 1 positions shown; findings below may reference images not displayed]

FINDINGS: Improvement in the left effusion following thoracentesis. No
pneumothorax. Small right effusion noted. Heart is enlarged with
vascular congestion and basilar atelectasis versus scarring. Aorta
atherosclerotic. Remote coronary bypass changes and cardiac valve
replacement. Degenerative changes of the spine. Bones are
osteopenic.
IMPRESSION: No pneumothorax following left thoracentesis.

## 2022-10-20 IMAGING — DX DG CHEST 1V
1 series · 1 of 1 positions shown · non-contrast
Comparison: 01/25/2021

CLINICAL DATA: 89-year-old male with history of CHF status post
left thoracentesis.

EXAM:
CHEST  1 VIEW

[chest ap]
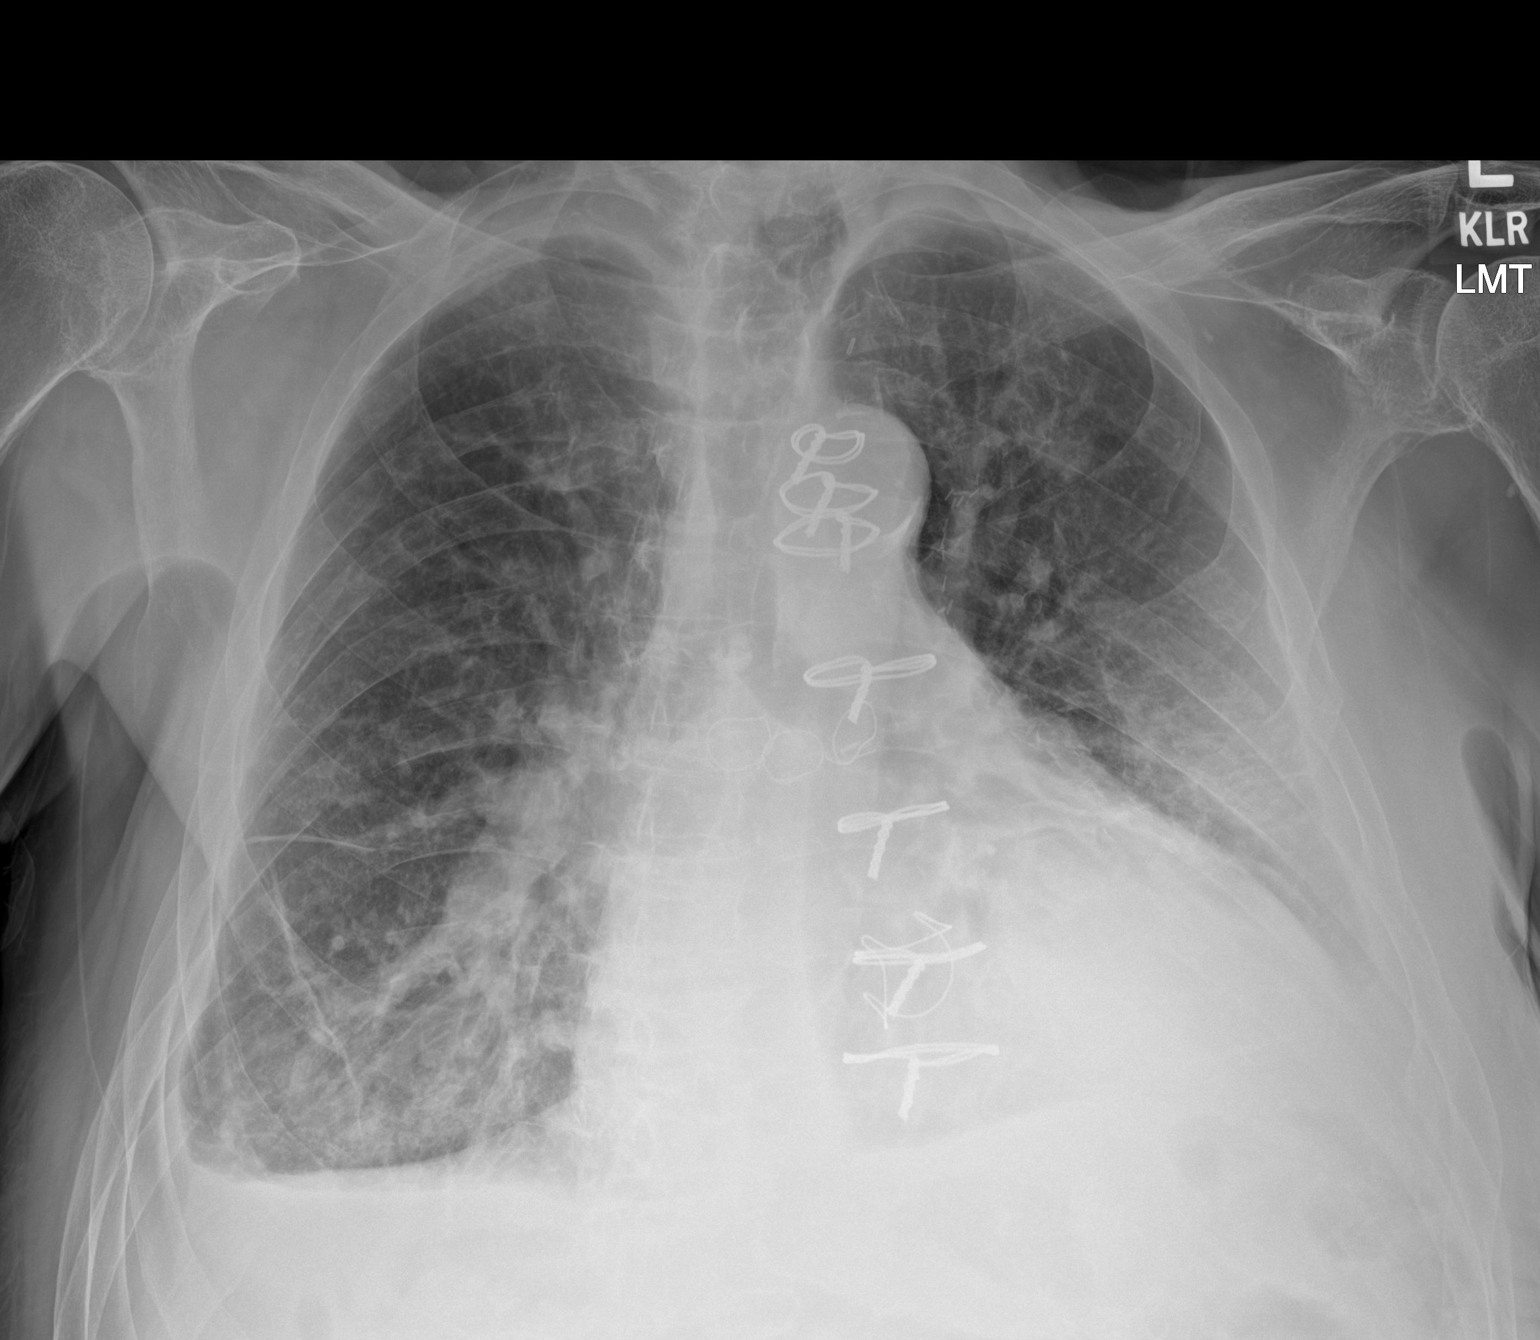

[1 of 1 positions shown; findings below may reference images not displayed]

FINDINGS: Stable cardiomediastinal silhouette with unchanged moderate
cardiomegaly. Persistent but decreased blunting of the right
costophrenic angle. Persistent passive in cicatricial atelectasis in
the right lower lobe. Similar appearing left costophrenic angle
blunting and mild retrocardiac opacities. No evidence pneumothorax.
No new focal consolidations. Atherosclerotic calcification of the
aortic arch. Median sternotomy wires remain in place and appear
intact in addition to postsurgical changes after coronary artery
bypass and aortic valve replacement. No acute osseous abnormality.
IMPRESSION: Trace residual right pleural effusion after right thoracentesis. No
evidence of pneumothorax.
